# Patient Record
Sex: Female | Born: 1955
Health system: Southern US, Community
[De-identification: ages and names within clinical notes are randomized; demographics above are authoritative.]

## PROBLEM LIST (undated history)

## (undated) DIAGNOSIS — K921 Melena: Secondary | ICD-10-CM

## (undated) DIAGNOSIS — R51 Headache: Secondary | ICD-10-CM

## (undated) DIAGNOSIS — D62 Acute posthemorrhagic anemia: Secondary | ICD-10-CM

## (undated) DIAGNOSIS — E559 Vitamin D deficiency, unspecified: Secondary | ICD-10-CM

## (undated) DIAGNOSIS — I4819 Other persistent atrial fibrillation: Secondary | ICD-10-CM

## (undated) DIAGNOSIS — E7800 Pure hypercholesterolemia, unspecified: Secondary | ICD-10-CM

## (undated) DIAGNOSIS — C801 Malignant (primary) neoplasm, unspecified: Secondary | ICD-10-CM

## (undated) DIAGNOSIS — R519 Headache, unspecified: Secondary | ICD-10-CM

## (undated) DIAGNOSIS — R569 Unspecified convulsions: Secondary | ICD-10-CM

## (undated) DIAGNOSIS — I10 Essential (primary) hypertension: Secondary | ICD-10-CM

## (undated) DIAGNOSIS — F419 Anxiety disorder, unspecified: Secondary | ICD-10-CM

---
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
## 2007-04-05 ENCOUNTER — Other Ambulatory Visit: Admission: RE | Admit: 2007-04-05 | Discharge: 2007-04-05 | Payer: Self-pay | Admitting: Family Medicine

## 2009-08-24 ENCOUNTER — Other Ambulatory Visit: Admission: RE | Admit: 2009-08-24 | Discharge: 2009-08-24 | Payer: Self-pay | Admitting: Family Medicine

## 2012-09-20 ENCOUNTER — Other Ambulatory Visit: Payer: Self-pay | Admitting: Physician Assistant

## 2012-09-20 ENCOUNTER — Other Ambulatory Visit (HOSPITAL_COMMUNITY)
Admission: RE | Admit: 2012-09-20 | Discharge: 2012-09-20 | Disposition: A | Discharge status: Home / Self Care | Payer: BC Managed Care – PPO | Source: Ambulatory Visit | Attending: Family Medicine | Admitting: Family Medicine

## 2012-09-20 DIAGNOSIS — Z0000 Encounter for general adult medical examination without abnormal findings: Secondary | ICD-10-CM | POA: Insufficient documentation

## 2017-04-02 ENCOUNTER — Other Ambulatory Visit: Payer: Self-pay

## 2017-04-02 ENCOUNTER — Emergency Department (HOSPITAL_COMMUNITY): Payer: BLUE CROSS/BLUE SHIELD

## 2017-04-02 ENCOUNTER — Encounter (HOSPITAL_COMMUNITY): Payer: Self-pay

## 2017-04-02 ENCOUNTER — Inpatient Hospital Stay (HOSPITAL_COMMUNITY)
Admission: EM | Admit: 2017-04-02 | Discharge: 2017-04-22 | DRG: 356 | Disposition: A | Discharge status: Rehabilitation Facility | Payer: BLUE CROSS/BLUE SHIELD | Attending: Internal Medicine | Admitting: Internal Medicine

## 2017-04-02 DIAGNOSIS — R1030 Lower abdominal pain, unspecified: Secondary | ICD-10-CM | POA: Diagnosis present

## 2017-04-02 DIAGNOSIS — K5730 Diverticulosis of large intestine without perforation or abscess without bleeding: Secondary | ICD-10-CM | POA: Diagnosis present

## 2017-04-02 DIAGNOSIS — R042 Hemoptysis: Secondary | ICD-10-CM | POA: Diagnosis not present

## 2017-04-02 DIAGNOSIS — Z4659 Encounter for fitting and adjustment of other gastrointestinal appliance and device: Secondary | ICD-10-CM

## 2017-04-02 DIAGNOSIS — I5030 Unspecified diastolic (congestive) heart failure: Secondary | ICD-10-CM | POA: Diagnosis present

## 2017-04-02 DIAGNOSIS — E876 Hypokalemia: Secondary | ICD-10-CM | POA: Diagnosis present

## 2017-04-02 DIAGNOSIS — K922 Gastrointestinal hemorrhage, unspecified: Secondary | ICD-10-CM

## 2017-04-02 DIAGNOSIS — Z978 Presence of other specified devices: Secondary | ICD-10-CM

## 2017-04-02 DIAGNOSIS — R571 Hypovolemic shock: Secondary | ICD-10-CM | POA: Diagnosis present

## 2017-04-02 DIAGNOSIS — R531 Weakness: Secondary | ICD-10-CM | POA: Diagnosis not present

## 2017-04-02 DIAGNOSIS — J9601 Acute respiratory failure with hypoxia: Secondary | ICD-10-CM | POA: Diagnosis not present

## 2017-04-02 DIAGNOSIS — R569 Unspecified convulsions: Secondary | ICD-10-CM | POA: Diagnosis not present

## 2017-04-02 DIAGNOSIS — D649 Anemia, unspecified: Secondary | ICD-10-CM | POA: Diagnosis not present

## 2017-04-02 DIAGNOSIS — I481 Persistent atrial fibrillation: Secondary | ICD-10-CM | POA: Diagnosis not present

## 2017-04-02 DIAGNOSIS — K250 Acute gastric ulcer with hemorrhage: Principal | ICD-10-CM | POA: Diagnosis present

## 2017-04-02 DIAGNOSIS — Z01818 Encounter for other preprocedural examination: Secondary | ICD-10-CM

## 2017-04-02 DIAGNOSIS — D4989 Neoplasm of unspecified behavior of other specified sites: Secondary | ICD-10-CM | POA: Diagnosis present

## 2017-04-02 DIAGNOSIS — Z7901 Long term (current) use of anticoagulants: Secondary | ICD-10-CM | POA: Diagnosis not present

## 2017-04-02 DIAGNOSIS — I340 Nonrheumatic mitral (valve) insufficiency: Secondary | ICD-10-CM | POA: Diagnosis not present

## 2017-04-02 DIAGNOSIS — K920 Hematemesis: Secondary | ICD-10-CM | POA: Diagnosis not present

## 2017-04-02 DIAGNOSIS — D696 Thrombocytopenia, unspecified: Secondary | ICD-10-CM | POA: Diagnosis not present

## 2017-04-02 DIAGNOSIS — K921 Melena: Secondary | ICD-10-CM | POA: Diagnosis present

## 2017-04-02 DIAGNOSIS — R197 Diarrhea, unspecified: Secondary | ICD-10-CM | POA: Diagnosis not present

## 2017-04-02 DIAGNOSIS — R5381 Other malaise: Secondary | ICD-10-CM | POA: Diagnosis not present

## 2017-04-02 DIAGNOSIS — I4819 Other persistent atrial fibrillation: Secondary | ICD-10-CM | POA: Diagnosis not present

## 2017-04-02 DIAGNOSIS — E7800 Pure hypercholesterolemia, unspecified: Secondary | ICD-10-CM | POA: Diagnosis present

## 2017-04-02 DIAGNOSIS — R7881 Bacteremia: Secondary | ICD-10-CM | POA: Diagnosis present

## 2017-04-02 DIAGNOSIS — R578 Other shock: Secondary | ICD-10-CM | POA: Diagnosis not present

## 2017-04-02 DIAGNOSIS — E46 Unspecified protein-calorie malnutrition: Secondary | ICD-10-CM | POA: Diagnosis not present

## 2017-04-02 DIAGNOSIS — I6783 Posterior reversible encephalopathy syndrome: Secondary | ICD-10-CM | POA: Diagnosis not present

## 2017-04-02 DIAGNOSIS — E785 Hyperlipidemia, unspecified: Secondary | ICD-10-CM | POA: Diagnosis present

## 2017-04-02 DIAGNOSIS — J9859 Other diseases of mediastinum, not elsewhere classified: Secondary | ICD-10-CM

## 2017-04-02 DIAGNOSIS — R109 Unspecified abdominal pain: Secondary | ICD-10-CM

## 2017-04-02 DIAGNOSIS — D62 Acute posthemorrhagic anemia: Secondary | ICD-10-CM | POA: Diagnosis present

## 2017-04-02 DIAGNOSIS — Z7982 Long term (current) use of aspirin: Secondary | ICD-10-CM

## 2017-04-02 DIAGNOSIS — J9600 Acute respiratory failure, unspecified whether with hypoxia or hypercapnia: Secondary | ICD-10-CM | POA: Diagnosis not present

## 2017-04-02 DIAGNOSIS — I700 Atherosclerosis of aorta: Secondary | ICD-10-CM | POA: Diagnosis present

## 2017-04-02 DIAGNOSIS — N179 Acute kidney failure, unspecified: Secondary | ICD-10-CM | POA: Diagnosis present

## 2017-04-02 DIAGNOSIS — R222 Localized swelling, mass and lump, trunk: Secondary | ICD-10-CM | POA: Diagnosis not present

## 2017-04-02 DIAGNOSIS — N814 Uterovaginal prolapse, unspecified: Secondary | ICD-10-CM | POA: Diagnosis present

## 2017-04-02 DIAGNOSIS — K253 Acute gastric ulcer without hemorrhage or perforation: Secondary | ICD-10-CM | POA: Diagnosis not present

## 2017-04-02 DIAGNOSIS — B9620 Unspecified Escherichia coli [E. coli] as the cause of diseases classified elsewhere: Secondary | ICD-10-CM | POA: Diagnosis present

## 2017-04-02 DIAGNOSIS — K264 Chronic or unspecified duodenal ulcer with hemorrhage: Secondary | ICD-10-CM

## 2017-04-02 DIAGNOSIS — G7281 Critical illness myopathy: Secondary | ICD-10-CM | POA: Diagnosis not present

## 2017-04-02 DIAGNOSIS — E874 Mixed disorder of acid-base balance: Secondary | ICD-10-CM | POA: Diagnosis not present

## 2017-04-02 DIAGNOSIS — Z452 Encounter for adjustment and management of vascular access device: Secondary | ICD-10-CM

## 2017-04-02 DIAGNOSIS — K56609 Unspecified intestinal obstruction, unspecified as to partial versus complete obstruction: Secondary | ICD-10-CM

## 2017-04-02 DIAGNOSIS — K909 Intestinal malabsorption, unspecified: Secondary | ICD-10-CM | POA: Diagnosis not present

## 2017-04-02 DIAGNOSIS — R935 Abnormal findings on diagnostic imaging of other abdominal regions, including retroperitoneum: Secondary | ICD-10-CM | POA: Diagnosis not present

## 2017-04-02 DIAGNOSIS — M7989 Other specified soft tissue disorders: Secondary | ICD-10-CM | POA: Diagnosis not present

## 2017-04-02 DIAGNOSIS — G9341 Metabolic encephalopathy: Secondary | ICD-10-CM | POA: Diagnosis not present

## 2017-04-02 DIAGNOSIS — Z6822 Body mass index (BMI) 22.0-22.9, adult: Secondary | ICD-10-CM

## 2017-04-02 DIAGNOSIS — E440 Moderate protein-calorie malnutrition: Secondary | ICD-10-CM | POA: Diagnosis present

## 2017-04-02 DIAGNOSIS — I82431 Acute embolism and thrombosis of right popliteal vein: Secondary | ICD-10-CM | POA: Diagnosis not present

## 2017-04-02 DIAGNOSIS — N136 Pyonephrosis: Secondary | ICD-10-CM | POA: Diagnosis present

## 2017-04-02 DIAGNOSIS — Z9981 Dependence on supplemental oxygen: Secondary | ICD-10-CM

## 2017-04-02 DIAGNOSIS — I82401 Acute embolism and thrombosis of unspecified deep veins of right lower extremity: Secondary | ICD-10-CM

## 2017-04-02 DIAGNOSIS — I480 Paroxysmal atrial fibrillation: Secondary | ICD-10-CM | POA: Diagnosis not present

## 2017-04-02 DIAGNOSIS — F17200 Nicotine dependence, unspecified, uncomplicated: Secondary | ICD-10-CM | POA: Diagnosis present

## 2017-04-02 DIAGNOSIS — R9431 Abnormal electrocardiogram [ECG] [EKG]: Secondary | ICD-10-CM

## 2017-04-02 DIAGNOSIS — L89150 Pressure ulcer of sacral region, unstageable: Secondary | ICD-10-CM

## 2017-04-02 DIAGNOSIS — N390 Urinary tract infection, site not specified: Secondary | ICD-10-CM

## 2017-04-02 DIAGNOSIS — E870 Hyperosmolality and hypernatremia: Secondary | ICD-10-CM | POA: Diagnosis not present

## 2017-04-02 DIAGNOSIS — R945 Abnormal results of liver function studies: Secondary | ICD-10-CM | POA: Diagnosis not present

## 2017-04-02 DIAGNOSIS — I10 Essential (primary) hypertension: Secondary | ICD-10-CM | POA: Diagnosis not present

## 2017-04-02 DIAGNOSIS — J9690 Respiratory failure, unspecified, unspecified whether with hypoxia or hypercapnia: Secondary | ICD-10-CM

## 2017-04-02 DIAGNOSIS — I484 Atypical atrial flutter: Secondary | ICD-10-CM | POA: Diagnosis not present

## 2017-04-02 DIAGNOSIS — Z8249 Family history of ischemic heart disease and other diseases of the circulatory system: Secondary | ICD-10-CM

## 2017-04-02 DIAGNOSIS — I4891 Unspecified atrial fibrillation: Secondary | ICD-10-CM | POA: Diagnosis not present

## 2017-04-02 DIAGNOSIS — I110 Hypertensive heart disease with heart failure: Secondary | ICD-10-CM | POA: Diagnosis present

## 2017-04-02 DIAGNOSIS — R0902 Hypoxemia: Secondary | ICD-10-CM | POA: Diagnosis not present

## 2017-04-02 DIAGNOSIS — R739 Hyperglycemia, unspecified: Secondary | ICD-10-CM | POA: Diagnosis not present

## 2017-04-02 DIAGNOSIS — R0600 Dyspnea, unspecified: Secondary | ICD-10-CM | POA: Diagnosis not present

## 2017-04-02 HISTORY — DX: Melena: K92.1

## 2017-04-02 HISTORY — DX: Pure hypercholesterolemia, unspecified: E78.00

## 2017-04-02 HISTORY — DX: Other persistent atrial fibrillation: I48.19

## 2017-04-02 HISTORY — DX: Essential (primary) hypertension: I10

## 2017-04-02 HISTORY — DX: Acute posthemorrhagic anemia: D62

## 2017-04-02 HISTORY — DX: Vitamin D deficiency, unspecified: E55.9

## 2017-04-02 LAB — PREPARE FRESH FROZEN PLASMA
Unit division: 0
Unit division: 0

## 2017-04-02 LAB — COMPREHENSIVE METABOLIC PANEL
ALT: 162 U/L — ABNORMAL HIGH (ref 14–54)
AST: 106 U/L — ABNORMAL HIGH (ref 15–41)
Albumin: 2.1 g/dL — ABNORMAL LOW (ref 3.5–5.0)
Alkaline Phosphatase: 118 U/L (ref 38–126)
Anion gap: 22 — ABNORMAL HIGH (ref 5–15)
BUN: 116 mg/dL — ABNORMAL HIGH (ref 6–20)
CO2: 17 mmol/L — ABNORMAL LOW (ref 22–32)
Calcium: 8.4 mg/dL — ABNORMAL LOW (ref 8.9–10.3)
Chloride: 99 mmol/L — ABNORMAL LOW (ref 101–111)
Creatinine, Ser: 1.49 mg/dL — ABNORMAL HIGH (ref 0.44–1.00)
GFR calc Af Amer: 42 mL/min — ABNORMAL LOW (ref >60)
GFR calc non Af Amer: 36 mL/min — ABNORMAL LOW (ref >60)
Glucose, Bld: 225 mg/dL — ABNORMAL HIGH (ref 65–99)
Potassium: 2.6 mmol/L — CL (ref 3.5–5.1)
Sodium: 138 mmol/L (ref 135–145)
Total Bilirubin: 0.7 mg/dL (ref 0.3–1.2)
Total Protein: 5.2 g/dL — ABNORMAL LOW (ref 6.5–8.1)

## 2017-04-02 LAB — I-STAT CHEM 8, ED
BUN: 100 mg/dL — ABNORMAL HIGH (ref 6–20)
BUN: 85 mg/dL — ABNORMAL HIGH (ref 6–20)
Calcium, Ion: 1.09 mmol/L — ABNORMAL LOW (ref 1.15–1.40)
Calcium, Ion: 1.05 mmol/L — ABNORMAL LOW (ref 1.15–1.40)
Chloride: 100 mmol/L — ABNORMAL LOW (ref 101–111)
Chloride: 106 mmol/L (ref 101–111)
Creatinine, Ser: 1.3 mg/dL — ABNORMAL HIGH (ref 0.44–1.00)
Creatinine, Ser: 0.9 mg/dL (ref 0.44–1.00)
Glucose, Bld: 220 mg/dL — ABNORMAL HIGH (ref 65–99)
Glucose, Bld: 93 mg/dL (ref 65–99)
HCT: 22 % — ABNORMAL LOW (ref 36.0–46.0)
HCT: 20 % — ABNORMAL LOW (ref 36.0–46.0)
Hemoglobin: 7.5 g/dL — ABNORMAL LOW (ref 12.0–15.0)
Hemoglobin: 6.8 g/dL — CL (ref 12.0–15.0)
Potassium: 2.6 mmol/L — CL (ref 3.5–5.1)
Potassium: 2.8 mmol/L — ABNORMAL LOW (ref 3.5–5.1)
Sodium: 138 mmol/L (ref 135–145)
Sodium: 141 mmol/L (ref 135–145)
TCO2: 19 mmol/L — ABNORMAL LOW (ref 22–32)
TCO2: 22 mmol/L (ref 22–32)

## 2017-04-02 LAB — CBC
HCT: 22.8 % — ABNORMAL LOW (ref 36.0–46.0)
Hemoglobin: 8 g/dL — ABNORMAL LOW (ref 12.0–15.0)
MCH: 31 pg (ref 26.0–34.0)
MCHC: 35.1 g/dL (ref 30.0–36.0)
MCV: 88.4 fL (ref 78.0–100.0)
Platelets: 249 10*3/uL (ref 150–400)
RBC: 2.58 MIL/uL — ABNORMAL LOW (ref 3.87–5.11)
RDW: 13.9 % (ref 11.5–15.5)
WBC: 16.2 10*3/uL — ABNORMAL HIGH (ref 4.0–10.5)

## 2017-04-02 LAB — URINALYSIS, ROUTINE W REFLEX MICROSCOPIC
Bilirubin Urine: NEGATIVE
Glucose, UA: NEGATIVE mg/dL
Ketones, ur: NEGATIVE mg/dL
Nitrite: NEGATIVE
Protein, ur: NEGATIVE mg/dL
Specific Gravity, Urine: 1.008 (ref 1.005–1.030)
pH: 5 (ref 5.0–8.0)

## 2017-04-02 LAB — PREPARE RBC (CROSSMATCH)

## 2017-04-02 LAB — I-STAT CG4 LACTIC ACID, ED
Lactic Acid, Venous: 7.98 mmol/L (ref 0.5–1.9)
Lactic Acid, Venous: 1.05 mmol/L (ref 0.5–1.9)

## 2017-04-02 LAB — POC OCCULT BLOOD, ED: Fecal Occult Bld: POSITIVE — AB

## 2017-04-02 LAB — LIPASE, BLOOD: Lipase: 17 U/L (ref 11–51)

## 2017-04-02 LAB — BPAM FFP
Blood Product Expiration Date: 201901252359
Blood Product Expiration Date: 201901252359
ISSUE DATE / TIME: 201901241558
ISSUE DATE / TIME: 201901241558
Unit Type and Rh: 6200
Unit Type and Rh: 6200

## 2017-04-02 LAB — PROTIME-INR
INR: 1.14
Prothrombin Time: 14.5 seconds (ref 11.4–15.2)

## 2017-04-02 LAB — I-STAT TROPONIN, ED: Troponin i, poc: 0.01 ng/mL (ref 0.00–0.08)

## 2017-04-02 LAB — APTT: aPTT: 27 seconds (ref 24–36)

## 2017-04-02 LAB — LACTIC ACID, PLASMA: Lactic Acid, Venous: 1.2 mmol/L (ref 0.5–1.9)

## 2017-04-02 LAB — ABO/RH: ABO/RH(D): AB POS

## 2017-04-02 MED ORDER — VANCOMYCIN HCL IN DEXTROSE 1-5 GM/200ML-% IV SOLN
1000.0000 mg | Freq: Once | INTRAVENOUS | Status: AC
Start: 2017-04-02 — End: 2017-04-02
  Administered 2017-04-02: 1000 mg via INTRAVENOUS
  Filled 2017-04-02: qty 200

## 2017-04-02 MED ORDER — FAMOTIDINE IN NACL 20-0.9 MG/50ML-% IV SOLN
20.0000 mg | Freq: Once | INTRAVENOUS | Status: AC
  Administered 2017-04-02: 20 mg via INTRAVENOUS
  Filled 2017-04-02: qty 50

## 2017-04-02 MED ORDER — ACETAMINOPHEN 325 MG PO TABS
650.0000 mg | ORAL_TABLET | Freq: Four times a day (QID) | ORAL | Status: DC | PRN
  Administered 2017-04-08 – 2017-04-21 (×6): 650 mg via ORAL
  Filled 2017-04-02 (×6): qty 2

## 2017-04-02 MED ORDER — SODIUM CHLORIDE 0.9 % IV SOLN
8.0000 mg/h | INTRAVENOUS | Status: AC
  Administered 2017-04-02 – 2017-04-04 (×3): 8 mg/h via INTRAVENOUS
  Filled 2017-04-02 (×10): qty 80

## 2017-04-02 MED ORDER — SODIUM CHLORIDE 0.9 % IV BOLUS (SEPSIS)
1000.0000 mL | Freq: Once | INTRAVENOUS | Status: AC
Start: 2017-04-02 — End: 2017-04-02
  Administered 2017-04-02: 1000 mL via INTRAVENOUS

## 2017-04-02 MED ORDER — SODIUM CHLORIDE 0.9% FLUSH
3.0000 mL | Freq: Two times a day (BID) | INTRAVENOUS | Status: DC
  Administered 2017-04-02 – 2017-04-22 (×28): 3 mL via INTRAVENOUS

## 2017-04-02 MED ORDER — SODIUM CHLORIDE 0.9 % IV SOLN
1000.0000 mL | INTRAVENOUS | Status: DC
  Administered 2017-04-02: 1000 mL via INTRAVENOUS

## 2017-04-02 MED ORDER — ONDANSETRON HCL 4 MG/2ML IJ SOLN: 4.0000 mg | Freq: Four times a day (QID) | INTRAMUSCULAR | Status: DC | PRN

## 2017-04-02 MED ORDER — POTASSIUM CHLORIDE 10 MEQ/100ML IV SOLN
10.0000 meq | Freq: Once | INTRAVENOUS | Status: AC
  Administered 2017-04-02: 10 meq via INTRAVENOUS
  Filled 2017-04-02: qty 100

## 2017-04-02 MED ORDER — PANTOPRAZOLE SODIUM 40 MG IV SOLR
40.0000 mg | Freq: Once | INTRAVENOUS | Status: AC
  Administered 2017-04-02: 40 mg via INTRAVENOUS
  Filled 2017-04-02: qty 40

## 2017-04-02 MED ORDER — PEG 3350-KCL-NA BICARB-NACL 420 G PO SOLR
4000.0000 mL | Freq: Once | ORAL | Status: DC
  Filled 2017-04-02: qty 4000

## 2017-04-02 MED ORDER — PANTOPRAZOLE SODIUM 40 MG IV SOLR
40.0000 mg | Freq: Once | INTRAVENOUS | Status: DC
  Filled 2017-04-02: qty 40

## 2017-04-02 MED ORDER — PIPERACILLIN-TAZOBACTAM 3.375 G IVPB 30 MIN
3.3750 g | Freq: Once | INTRAVENOUS | Status: AC
  Administered 2017-04-02: 3.375 g via INTRAVENOUS
  Filled 2017-04-02: qty 50

## 2017-04-02 MED ORDER — VANCOMYCIN HCL 500 MG IV SOLR
500.0000 mg | Freq: Two times a day (BID) | INTRAVENOUS | Status: DC
  Administered 2017-04-03 – 2017-04-04 (×3): 500 mg via INTRAVENOUS
  Filled 2017-04-02 (×5): qty 500

## 2017-04-02 MED ORDER — NICOTINE 14 MG/24HR TD PT24
14.0000 mg | MEDICATED_PATCH | Freq: Every day | TRANSDERMAL | Status: DC | PRN
  Filled 2017-04-02: qty 1

## 2017-04-02 MED ORDER — SODIUM CHLORIDE 0.9 % IV BOLUS (SEPSIS)
250.0000 mL | Freq: Once | INTRAVENOUS | Status: AC
  Administered 2017-04-02: 250 mL via INTRAVENOUS

## 2017-04-02 MED ORDER — SODIUM CHLORIDE 0.9 % IV BOLUS (SEPSIS)
1000.0000 mL | Freq: Once | INTRAVENOUS | Status: AC
  Administered 2017-04-02: 1000 mL via INTRAVENOUS

## 2017-04-02 MED ORDER — ONDANSETRON HCL 4 MG PO TABS: 4.0000 mg | ORAL_TABLET | Freq: Four times a day (QID) | ORAL | Status: DC | PRN

## 2017-04-02 MED ORDER — ACETAMINOPHEN 650 MG RE SUPP: 650.0000 mg | Freq: Four times a day (QID) | RECTAL | Status: DC | PRN

## 2017-04-02 MED ORDER — HYDROMORPHONE HCL 1 MG/ML IJ SOLN
0.5000 mg | Freq: Once | INTRAMUSCULAR | Status: AC
  Administered 2017-04-02: 0.5 mg via INTRAVENOUS
  Filled 2017-04-02: qty 1

## 2017-04-02 MED ORDER — PIPERACILLIN-TAZOBACTAM 3.375 G IVPB
3.3750 g | Freq: Three times a day (TID) | INTRAVENOUS | Status: DC
  Administered 2017-04-02 – 2017-04-04 (×5): 3.375 g via INTRAVENOUS
  Filled 2017-04-02 (×7): qty 50

## 2017-04-02 NOTE — ED Notes (Addendum)
Emergency release blood unit [id number] initiated per MD order.

## 2017-04-02 NOTE — ED Notes (Signed)
Pt returned from CT °

## 2017-04-02 NOTE — ED Notes (Signed)
I stat chem 8 results given to Dr. Johnney Killian by B. Yolanda Bonine, EMT

## 2017-04-02 NOTE — ED Triage Notes (Signed)
Per Pt, Pt is coming from home with complaints of abdominal pain and diarrhea that started two days ago. Was seen at MD office and sent over here for low BP. Pt is alert and oriented at this time, but reports increased lower abdominal pain.

## 2017-04-02 NOTE — Progress Notes (Signed)
Pharmacy Antibiotic Note  Kayla Wyatt is a 62 y.o. female admitted on 04/02/2017 with sepsis.  Pharmacy has been consulted for vancomycin and Zosyn dosing. Elevated lactate at 7.98. Elevated WBC at 16.2. Scr 1.3 (Calculated CrCl 47 ml/min).   Plan: Vancomycin 1000mg  IV x1 Vancomycin 500mg  IV q12h Zosyn 3.375g IV x1 Zosyn 3.375g IV q8h (4 hour infusion) F/u renal fxn, trough @ SS, clinical resolution  Height: 5\' 3"  (160 cm) Weight: 150 lb (68 kg) IBW/kg (Calculated) : 52.4  Temp (24hrs), Avg:97.7 F (36.5 C), Min:97.7 F (36.5 C), Max:97.7 F (36.5 C)  Recent Labs  Lab 04/02/17 1459 04/02/17 1518 04/02/17 1559  WBC 16.2*  --   --   CREATININE  --   --  1.30*  LATICACIDVEN  --  7.98*  --     Estimated Creatinine Clearance: 40.4 mL/min (A) (by C-G formula based on SCr of 1.3 mg/dL (H)).    No Known Allergies  Antimicrobials this admission: Vanc 1/24 >>  Zosyn 1/24 >>   Dose adjustments this admission: None  Microbiology results: Pending  Thank you for allowing pharmacy to be a part of this patient's care.  Maddox Hlavaty 04/02/2017 4:28 PM

## 2017-04-02 NOTE — ED Notes (Signed)
EMERGENCY RELEASE BLOOD REQUESTED BY RN CRYSTAL.

## 2017-04-02 NOTE — ED Notes (Signed)
Charge nurse Rich Reining RN notified of I stat lactic acid results.

## 2017-04-02 NOTE — H&P (View-Only) (Signed)
Reason for Consult: Hematemesis, melena, and abdominal pain Referring Physician: [facility]  Windy Dudek HPI: This is a 62 year old female admitted for complaints of lower abdominal pain, melena, and hematemesis.  The patient reports having chronic lower abdominal pain, which worsened a couple of days ago.  She states that she has multiple dark and bowel movements that increasingly worsened.  Today in the ER she was witnessed to have hematemesis.  Her admission BP was hypotensive, but it did increase with fluid resuscitation.  WBC was identified to be at 16.2 and an HGB of 6.8.  A noncontrast CT scan shows bulky infrarenal abdominal aortic atherosclerosis, but there was no overt evidence of ischemia.  Her lactic acid was found to be at 7.89.  Zosyn was initiated.  There is no prior history of GI bleeding and a few years ago she had a normal screening colonoscopy in [place].  Past Medical History:  Diagnosis Date  . Hypercholesteremia   . Hypertension     Past Surgical History:  Procedure Laterality Date  . LEG SURGERY  1974   Blood Clot Removal     No family history on file.  Social History:  reports that she has been smoking.  she has never used smokeless tobacco. She reports that she does not drink alcohol or use drugs.  Allergies: No Known Allergies  Medications:  Scheduled: . pantoprazole (PROTONIX) IV  40 mg Intravenous Once  . polyethylene glycol-electrolytes  4,000 mL Oral Once   Continuous: . sodium chloride 1,000 mL (04/02/17 1550)  . pantoprozole (PROTONIX) infusion    . piperacillin-tazobactam (ZOSYN)  IV    . potassium chloride    . [START ON 04/03/2017] vancomycin      Results for orders placed or performed during the hospital encounter of 04/02/17 (from the past 24 hour(s))  Lipase, blood     Status: None   Collection Time: 04/02/17  2:59 PM  Result Value Ref Range   Lipase 17 11 - 51 U/L  Comprehensive metabolic panel     Status: Abnormal   Collection Time: 04/02/17  2:59 PM  Result Value Ref Range   Sodium 138 135 - 145 mmol/L   Potassium 2.6 (LL) 3.5 - 5.1 mmol/L   Chloride 99 (L) 101 - 111 mmol/L   CO2 17 (L) 22 - 32 mmol/L   Glucose, Bld 225 (H) 65 - 99 mg/dL   BUN 116 (H) 6 - 20 mg/dL   Creatinine, Ser 1.49 (H) 0.44 - 1.00 mg/dL   Calcium 8.4 (L) 8.9 - 10.3 mg/dL   Total Protein 5.2 (L) 6.5 - 8.1 g/dL   Albumin 2.1 (L) 3.5 - 5.0 g/dL   AST 106 (H) 15 - 41 U/L   ALT 162 (H) 14 - 54 U/L   Alkaline Phosphatase 118 38 - 126 U/L   Total Bilirubin 0.7 0.3 - 1.2 mg/dL   GFR calc non Af Amer 36 (L) >60 mL/min   GFR calc Af Amer 42 (L) >60 mL/min   Anion gap 22 (H) 5 - 15  CBC     Status: Abnormal   Collection Time: 04/02/17  2:59 PM  Result Value Ref Range   WBC 16.2 (H) 4.0 - 10.5 K/uL   RBC 2.58 (L) 3.87 - 5.11 MIL/uL   Hemoglobin 8.0 (L) 12.0 - 15.0 g/dL   HCT 22.8 (L) 36.0 - 46.0 %   MCV 88.4 78.0 - 100.0 fL   MCH 31.0 26.0 - 34.0 pg  MCHC 35.1 30.0 - 36.0 g/dL   RDW 13.9 11.5 - 15.5 %   Platelets 249 150 - 400 K/uL  Protime-INR     Status: None   Collection Time: 04/02/17  2:59 PM  Result Value Ref Range   Prothrombin Time 14.5 11.4 - 15.2 seconds   INR 1.14   I-Stat CG4 Lactic Acid, ED     Status: Abnormal   Collection Time: 04/02/17  3:18 PM  Result Value Ref Range   Lactic Acid, Venous 7.98 (HH) 0.5 - 1.9 mmol/L   Comment NOTIFIED PHYSICIAN   Type and screen [place]     Status: None (Preliminary result)   Collection Time: 04/02/17  3:44 PM  Result Value Ref Range   ABO/RH(D) AB POS    Antibody Screen NEG    Sample Expiration 04/05/2017    Unit Number [id number]    Blood Component Type RED CELLS,LR    Unit division 00    Status of Unit ISSUED    Unit tag comment VERBAL ORDERS PER DR PFEIFER    Transfusion Status OK TO TRANSFUSE    Crossmatch Result COMPATIBLE    Unit Number [id number]    Blood Component Type RED CELLS,LR    Unit division 00    Status of Unit REL  FROM [facility]    Unit tag comment VERBAL ORDERS PER DR PFEIFER    Transfusion Status OK TO TRANSFUSE    Crossmatch Result NOT NEEDED    Unit Number [id number]    Blood Component Type RED CELLS,LR    Unit division 00    Status of Unit ALLOCATED    Transfusion Status OK TO TRANSFUSE    Crossmatch Result Compatible    Unit Number [id number]    Blood Component Type RED CELLS,LR    Unit division 00    Status of Unit ALLOCATED    Transfusion Status OK TO TRANSFUSE    Crossmatch Result Compatible   ABO/Rh     Status: None   Collection Time: 04/02/17  3:44 PM  Result Value Ref Range   ABO/RH(D) AB POS   POC occult blood, ED     Status: Abnormal   Collection Time: 04/02/17  3:52 PM  Result Value Ref Range   Fecal Occult Bld POSITIVE (A) NEGATIVE  I-stat troponin, ED (not at [facility])     Status: None   Collection Time: 04/02/17  3:57 PM  Result Value Ref Range   Troponin i, poc 0.01 0.00 - 0.08 ng/mL   Comment 3          Prepare RBC     Status: None   Collection Time: 04/02/17  3:57 PM  Result Value Ref Range   Order Confirmation ORDER PROCESSED BY BLOOD BANK   Prepare fresh frozen plasma     Status: None   Collection Time: 04/02/17  3:57 PM  Result Value Ref Range   Unit Number [id number]    Blood Component Type LIQ PLASMA    Unit division 00    Status of Unit REL FROM [facility]    Unit tag comment VERBAL ORDERS PER DR PFEIFER    Transfusion Status OK TO TRANSFUSE    Unit Number [id number]    Blood Component Type LIQ PLASMA    Unit division 00    Status of Unit REL FROM [facility]    Unit tag comment VERBAL ORDERS PER DR PFEIFER    Transfusion Status OK TO TRANSFUSE   I-stat Chem 8,  ED     Status: Abnormal   Collection Time: 04/02/17  3:59 PM  Result Value Ref Range   Sodium 138 135 - 145 mmol/L   Potassium 2.6 (LL) 3.5 - 5.1 mmol/L   Chloride 100 (L) 101 - 111 mmol/L   BUN 100 (H) 6 - 20 mg/dL   Creatinine, Ser 1.30 (H) 0.44 - 1.00 mg/dL   Glucose, Bld 220 (H) 65 -  99 mg/dL   Calcium, Ion 1.09 (L) 1.15 - 1.40 mmol/L   TCO2 19 (L) 22 - 32 mmol/L   Hemoglobin 7.5 (L) 12.0 - 15.0 g/dL   HCT 22.0 (L) 36.0 - 46.0 %   Comment NOTIFIED PHYSICIAN   APTT     Status: None   Collection Time: 04/02/17  4:38 PM  Result Value Ref Range   aPTT 27 24 - 36 seconds  Urinalysis, Routine w reflex microscopic     Status: Abnormal   Collection Time: 04/02/17  5:05 PM  Result Value Ref Range   Color, Urine YELLOW YELLOW   APPearance CLOUDY (A) CLEAR   Specific Gravity, Urine 1.008 1.005 - 1.030   pH 5.0 5.0 - 8.0   Glucose, UA NEGATIVE NEGATIVE mg/dL   Hgb urine dipstick LARGE (A) NEGATIVE   Bilirubin Urine NEGATIVE NEGATIVE   Ketones, ur NEGATIVE NEGATIVE mg/dL   Protein, ur NEGATIVE NEGATIVE mg/dL   Nitrite NEGATIVE NEGATIVE   Leukocytes, UA LARGE (A) NEGATIVE   RBC / HPF 0-5 0 - 5 RBC/hpf   WBC, UA TOO NUMEROUS TO COUNT 0 - 5 WBC/hpf   Bacteria, UA MANY (A) NONE SEEN   Squamous Epithelial / LPF 0-5 (A) NONE SEEN   WBC Clumps PRESENT    Mucus PRESENT    Hyaline Casts, UA PRESENT   I-stat chem 8, ed     Status: Abnormal   Collection Time: 04/02/17  7:32 PM  Result Value Ref Range   Sodium 141 135 - 145 mmol/L   Potassium 2.8 (L) 3.5 - 5.1 mmol/L   Chloride 106 101 - 111 mmol/L   BUN 85 (H) 6 - 20 mg/dL   Creatinine, Ser 0.90 0.44 - 1.00 mg/dL   Glucose, Bld 93 65 - 99 mg/dL   Calcium, Ion 1.05 (L) 1.15 - 1.40 mmol/L   TCO2 22 22 - 32 mmol/L   Hemoglobin 6.8 (LL) 12.0 - 15.0 g/dL   HCT 20.0 (L) 36.0 - 46.0 %   Comment NOTIFIED PHYSICIAN   I-Stat CG4 Lactic Acid, ED     Status: None   Collection Time: 04/02/17  7:39 PM  Result Value Ref Range   Lactic Acid, Venous 1.05 0.5 - 1.9 mmol/L     Ct Abdomen Pelvis Wo Contrast  Result Date: 04/02/2017 CLINICAL DATA:  62 year old female with abdominal pain and diarrhea beginning 2 days ago. Hypotensive at physician's office today. Melena. EXAM: CT ABDOMEN AND PELVIS WITHOUT CONTRAST TECHNIQUE: Multidetector  CT imaging of the abdomen and pelvis was performed following the standard protocol without IV contrast. COMPARISON:  Portable chest radiograph 1708 hr today. FINDINGS: Lower chest: Normal lung bases. No cardiomegaly, pericardial effusion or pleural effusion. Hepatobiliary: Negative noncontrast liver and gallbladder. Pancreas: Diminutive, a degree of pancreatic atrophy is suspected. Spleen: Negative. Adrenals/Urinary Tract: Mild bilateral adrenal gland thickening such as due to hyperplasia. No nephrolithiasis. The bilateral ureters and bilateral renal collecting systems are somewhat prominent, but appears symmetric. There is no calculus identified along the course of either ureter. Urinary bladder is diminutive and  unremarkable. There is no definite acute perinephric stranding. There is possibly a urethral diverticulum measuring about 12 millimeters on series 3, image 86. Stomach/Bowel: Decompressed rectum and sigmoid colon. Decompressed left colon and splenic flexure. Decompressed transverse colon and right colon which demonstrate some nonspecific fatty proliferation of the mucosa. Normal appendix. Decompressed terminal ileum. Upper limits of normal to mildly dilated distal small bowel loops upstream of the duodenum located in the right lower abdomen and pelvis (e.g. Series 3, images 60-67. There is a gradual transition to normal sized bowel loops both proximal and distal to these segments. No mesenteric stranding. Mildly flocculated material within some of the prominent loops. Other small bowel loops are nondilated and appear normal. Negative stomach and duodenum. No abdominal free air.  No free fluid. Vascular/Lymphatic: Aortoiliac calcified atherosclerosis. There is evidence of severe infrarenal aortic stenosis on series 3, image 33 related to bulky calcified plaque. Vascular patency is not evaluated in the absence of IV contrast. New line no lymphadenopathy identified. Reproductive: Surgically absent. Other: No  pelvic free fluid. Musculoskeletal: No acute osseous abnormality identified. IMPRESSION: 1. Bulky calcified atherosclerosis of the infrarenal abdominal aorta suspicious for a high-grade aortic stenosis (infrarenal) on series 3, image 33. 2. Several mildly dilated distal small bowel loops, but no transition point. No associated mesenteric inflammation or free fluid. No inflamed large bowel identified. The appearance is nonspecific and might reflect ileus. In light of #1, bowel ischemia was considered, but there is no strong evidence of ischemic bowel at this time. 3. Mild symmetric appearing bilateral hydronephrosis and hydroureter with no obstructing etiology identified. Electronically Signed   By: Genevie Ann M.D.   On: 04/02/2017 18:48   Dg Chest Port 1 View  Result Date: 04/02/2017 CLINICAL DATA:  Sepsis. Abdominal pain, vomiting, and diarrhea. Smoking history. EXAM: PORTABLE CHEST 1 VIEW COMPARISON:  None. FINDINGS: The cardiac silhouette is upper limits of normal in size. No airspace consolidation, edema, pleural effusion, pneumothorax is identified. No acute osseous abnormality is seen. IMPRESSION: No active disease. Electronically Signed   By: Logan Bores M.D.   On: 04/02/2017 17:34    ROS:  As stated above in the HPI otherwise negative.  Blood pressure (!) 102/49, pulse 79, temperature 97.9 F (36.6 C), temperature source Oral, resp. rate (!) 24, height 5\' 3"  (1.6 m), weight 68 kg (150 lb), SpO2 95 %.    PE: Gen: Uncomfortable appearing, Alert and Oriented HEENT:  [place]/AT, EOMI Neck: Supple, no LAD Lungs: CTA Bilaterally CV: RRR without M/G/R ABM: Soft, NTND, +BS - She just received pain medication. Ext: No C/C/E  Assessment/Plan: 1) Anemia. 2) Melena. 3) Hematemesis. 4) Atherosclerosis. 5) Lower abdominal pain.   Further evaluation with an EGD/Colonoscopy will be pursued.  She thinks that she can take the prep for the colonoscopy.  Her lower abdominal pain improved with pain  medication, but is the concern for aortic stenosis.  There was no overt evidence of ischemic bowel, but this was a noncontrast scan.  Plan: 1) EGD/Colonoscopy tomorrow.  Priscila Bean D 04/02/2017, 8:01 PM

## 2017-04-02 NOTE — ED Provider Notes (Signed)
Patient reports lower abdominal pain and diarrhea every time she eats for about a week.  She reports she has been taking ibuprofen and Aleve to try to relieve abdominal pain.  Reports 1 episodes of vomiting yesterday.  It looked like cranberry juice but she reports she had just drank cranberry juice.  Bowel movement has been dark looking.  No known history of GI bleed.  Patient also typically takes 1 daily aspirin.  Only medical problems are hypertension hypercholesterolemia.  Patient is alert and oriented.  She is very pale in appearance.  She appears uncomfortable.  Heart is regular, tachycardic.  Lungs are grossly clear no wheeze rhonchi.  Abdomen is soft without guarding but patient identifies some lower central abdominal pain.  Rectal exam is done by PA-C while I am in the room.  Stool is melanotic.  No peripheral edema.  Skin is warm and dry but pale.  Neurologically the patient is intact.  Immediate findings are concerning for GI bleed with hypovolemia.  Also consideration for sepsis of abdominal etiology.  Treatment for both etiologies are being initiated emergently as diagnostic evaluation is continued.    Charlesetta Shanks, MD 04/02/17 [phone]

## 2017-04-02 NOTE — ED Provider Notes (Signed)
[facility] Provider Note   CSN: [id number] Arrival date & time: 04/02/17  1447     History   Chief Complaint Chief Complaint  Patient presents with  . Abdominal Pain    HPI Kayla Wyatt is a 62 y.o. female with history of hypertension and hyperlipidemia here for evaluation of lower abdominal pain, bloody diarrhea, nausea and vomiting. She has had lower abdominal pain intermittently for several months but became worse on Tuesday. Has had approximately 15+ bowel movements, black/bloody this morning. One episode of emesis yesterday, red tinged however had recently drank cranberry juice. Reports feeling dizzy and having leg weakness with ambulation for the last couple days. She denies fevers, chest pain, shortness of breath, dysuria, hematuria. Denies previous history of abdominal surgeries, GI bleed, ulcers. Denies EtOH abuse. Denies previous problems with low hemoglobin.  HPI  Past Medical History:  Diagnosis Date  . Hypercholesteremia   . Hypertension     There are no active problems to display for this patient.   Past Surgical History:  Procedure Laterality Date  . LEG SURGERY  1974   Blood Clot Removal     OB History    No data available       Home Medications    Prior to Admission medications   Medication Sig Start Date End Date Taking? Authorizing Provider  aspirin 81 MG tablet Take 81 mg by mouth daily.   Yes [provider]  cholecalciferol (VITAMIN D) 1000 units tablet Take 1,000 Units by mouth 2 (two) times daily.   Yes [provider]  lisinopril-hydrochlorothiazide (PRINZIDE,ZESTORETIC) 20-12.5 MG tablet Take 1 tablet by mouth daily.   Yes [provider]  simvastatin (ZOCOR) 20 MG tablet Take 1 tablet by mouth daily. 01/02/17  Yes [provider]    Family History No family history on file.  Social History Social History   Tobacco Use  . Smoking status: Current Every Day  Smoker  . Smokeless tobacco: Never Used  Substance Use Topics  . Alcohol use: No    Frequency: Never  . Drug use: No     Allergies   Patient has no known allergies.   Review of Systems Review of Systems  Constitutional: Positive for chills.  Gastrointestinal: Positive for abdominal pain, blood in stool, diarrhea (bloody), nausea and vomiting.  Skin: Positive for color change (pale).  Neurological: Positive for weakness ("leg") and light-headedness.     Physical Exam Updated Vital Signs BP (!) 102/49   Pulse 79   Temp 97.9 F (36.6 C) (Oral)   Resp (!) 24   Ht 5\' 3"  (1.6 m)   Wt 68 kg (150 lb)   SpO2 95%   BMI 26.57 kg/m   Physical Exam  Constitutional: She is oriented to person, place, and time. She appears well-developed and well-nourished. No distress.  Pale, appears uncomfortable. Alert and oriented to self, place, time and events.  HENT:  Head: Normocephalic and atraumatic.  Nose: Nose normal.  Mouth/Throat: No oropharyngeal exudate.  Dry lips and mucous membranes. Oropharynx and tonsils normal.  Eyes: Conjunctivae and EOM are normal. Pupils are equal, round, and reactive to light.  Neck: Normal range of motion.  Cardiovascular: Regular rhythm and intact distal pulses.  No murmur heard. Tachycardic. Hypotensive. 2+ DP and radial pulses bilaterally. No LE edema.   Pulmonary/Chest: Effort normal and breath sounds normal. No respiratory distress. She has no wheezes. She has no rales.  Abdominal: Soft. Bowel sounds are normal. There  is tenderness in the suprapubic area.  Suprapubic abdominal tenderness with deep pressure. No G/R/R. No CVA tenderness.   Genitourinary: Rectal exam shows guaiac positive stool.  Genitourinary Comments:  Rectal exam performed with supervising physician and RN in the room. Melanotic stool. No perianal masses, hemorrhoids, fissures or tenderness. Good rectal tone.  Musculoskeletal: Normal range of motion. She exhibits no deformity.    Neurological: She is alert and oriented to person, place, and time.  Skin: Skin is warm and dry. Capillary refill takes less than 2 seconds.  Pale. Dry, warm. No diaphoresis.  Psychiatric: She has a normal mood and affect. Her behavior is normal. Judgment and thought content normal.  Nursing note and vitals reviewed.    ED Treatments / Results  Labs (all labs ordered are listed, but only abnormal results are displayed) Labs Reviewed  COMPREHENSIVE METABOLIC PANEL - Abnormal; Notable for the following components:      Result Value   Potassium 2.6 (*)    Chloride 99 (*)    CO2 17 (*)    Glucose, Bld 225 (*)    BUN 116 (*)    Creatinine, Ser 1.49 (*)    Calcium 8.4 (*)    Total Protein 5.2 (*)    Albumin 2.1 (*)    AST 106 (*)    ALT 162 (*)    GFR calc non Af Amer 36 (*)    GFR calc Af Amer 42 (*)    Anion gap 22 (*)    All other components within normal limits  CBC - Abnormal; Notable for the following components:   WBC 16.2 (*)    RBC 2.58 (*)    Hemoglobin 8.0 (*)    HCT 22.8 (*)    All other components within normal limits  URINALYSIS, ROUTINE W REFLEX MICROSCOPIC - Abnormal; Notable for the following components:   APPearance CLOUDY (*)    Hgb urine dipstick LARGE (*)    Leukocytes, UA LARGE (*)    Bacteria, UA MANY (*)    Squamous Epithelial / LPF 0-5 (*)    All other components within normal limits  I-STAT CG4 LACTIC ACID, ED - Abnormal; Notable for the following components:   Lactic Acid, Venous 7.98 (*)    All other components within normal limits  POC OCCULT BLOOD, ED - Abnormal; Notable for the following components:   Fecal Occult Bld POSITIVE (*)    All other components within normal limits  I-STAT CHEM 8, ED - Abnormal; Notable for the following components:   Potassium 2.6 (*)    Chloride 100 (*)    BUN 100 (*)    Creatinine, Ser 1.30 (*)    Glucose, Bld 220 (*)    Calcium, Ion 1.09 (*)    TCO2 19 (*)    Hemoglobin 7.5 (*)    HCT 22.0 (*)    All  other components within normal limits  I-STAT CHEM 8, ED - Abnormal; Notable for the following components:   Potassium 2.8 (*)    BUN 85 (*)    Calcium, Ion 1.05 (*)    Hemoglobin 6.8 (*)    HCT 20.0 (*)    All other components within normal limits  CULTURE, BLOOD (ROUTINE X 2)  CULTURE, BLOOD (ROUTINE X 2)  URINE CULTURE  LIPASE, BLOOD  PROTIME-INR  APTT  I-STAT TROPONIN, ED  I-STAT CG4 LACTIC ACID, ED  TYPE AND SCREEN  PREPARE RBC (CROSSMATCH)  PREPARE FRESH FROZEN PLASMA  ABO/RH  EKG  EKG Interpretation  Date/Time:  Thursday April 02 2017 16:56:39 EST Ventricular Rate:  92 PR Interval:    QRS Duration: 171 QT Interval:  406 QTC Calculation: 503 R Axis:   86 Text Interpretation:  Sinus rhythm Atrial premature complex Nonspecific intraventricular conduction delay Minimal ST depression, inferior leads agree. no acute ischemic changes. no old comparison Confirmed by Charlesetta Shanks [id number]) on 04/02/2017 5:18:06 PM       Radiology Ct Abdomen Pelvis Wo Contrast  Result Date: 04/02/2017 CLINICAL DATA:  61 year old female with abdominal pain and diarrhea beginning 2 days ago. Hypotensive at physician's office today. Melena. EXAM: CT ABDOMEN AND PELVIS WITHOUT CONTRAST TECHNIQUE: Multidetector CT imaging of the abdomen and pelvis was performed following the standard protocol without IV contrast. COMPARISON:  Portable chest radiograph 1708 hr today. FINDINGS: Lower chest: Normal lung bases. No cardiomegaly, pericardial effusion or pleural effusion. Hepatobiliary: Negative noncontrast liver and gallbladder. Pancreas: Diminutive, a degree of pancreatic atrophy is suspected. Spleen: Negative. Adrenals/Urinary Tract: Mild bilateral adrenal gland thickening such as due to hyperplasia. No nephrolithiasis. The bilateral ureters and bilateral renal collecting systems are somewhat prominent, but appears symmetric. There is no calculus identified along the course of either ureter. Urinary  bladder is diminutive and unremarkable. There is no definite acute perinephric stranding. There is possibly a urethral diverticulum measuring about 12 millimeters on series 3, image 86. Stomach/Bowel: Decompressed rectum and sigmoid colon. Decompressed left colon and splenic flexure. Decompressed transverse colon and right colon which demonstrate some nonspecific fatty proliferation of the mucosa. Normal appendix. Decompressed terminal ileum. Upper limits of normal to mildly dilated distal small bowel loops upstream of the duodenum located in the right lower abdomen and pelvis (e.g. Series 3, images 60-67. There is a gradual transition to normal sized bowel loops both proximal and distal to these segments. No mesenteric stranding. Mildly flocculated material within some of the prominent loops. Other small bowel loops are nondilated and appear normal. Negative stomach and duodenum. No abdominal free air.  No free fluid. Vascular/Lymphatic: Aortoiliac calcified atherosclerosis. There is evidence of severe infrarenal aortic stenosis on series 3, image 33 related to bulky calcified plaque. Vascular patency is not evaluated in the absence of IV contrast. New line no lymphadenopathy identified. Reproductive: Surgically absent. Other: No pelvic free fluid. Musculoskeletal: No acute osseous abnormality identified. IMPRESSION: 1. Bulky calcified atherosclerosis of the infrarenal abdominal aorta suspicious for a high-grade aortic stenosis (infrarenal) on series 3, image 33. 2. Several mildly dilated distal small bowel loops, but no transition point. No associated mesenteric inflammation or free fluid. No inflamed large bowel identified. The appearance is nonspecific and might reflect ileus. In light of #1, bowel ischemia was considered, but there is no strong evidence of ischemic bowel at this time. 3. Mild symmetric appearing bilateral hydronephrosis and hydroureter with no obstructing etiology identified. Electronically  Signed   By: Genevie Ann M.D.   On: 04/02/2017 18:48   Dg Chest Port 1 View  Result Date: 04/02/2017 CLINICAL DATA:  Sepsis. Abdominal pain, vomiting, and diarrhea. Smoking history. EXAM: PORTABLE CHEST 1 VIEW COMPARISON:  None. FINDINGS: The cardiac silhouette is upper limits of normal in size. No airspace consolidation, edema, pleural effusion, pneumothorax is identified. No acute osseous abnormality is seen. IMPRESSION: No active disease. Electronically Signed   By: Logan Bores M.D.   On: 04/02/2017 17:34    Procedures Procedures (including critical care time)  CRITICAL CARE Performed by: Kinnie Feil   Total critical care time: 120  minutes  Critical care time was exclusive of separately billable procedures and treating other patients.  Critical care was necessary to treat or prevent imminent or life-threatening deterioration.  Critical care was time spent personally by me on the following activities: development of treatment plan with patient and/or surrogate as well as nursing, discussions with consultants, evaluation of patient's response to treatment, examination of patient, obtaining history from patient or surrogate, ordering and performing treatments and interventions, ordering and review of laboratory studies, ordering and review of radiographic studies, pulse oximetry and re-evaluation of patient's condition.  Medications Ordered in ED Medications  0.9 %  sodium chloride infusion (1,000 mLs Intravenous New Bag/Given 04/02/17 1550)  vancomycin (VANCOCIN) 500 mg in sodium chloride 0.9 % 100 mL IVPB (not administered)  piperacillin-tazobactam (ZOSYN) IVPB 3.375 g (not administered)  potassium chloride 10 mEq in 100 mL IVPB (10 mEq Intravenous New Bag/Given 04/02/17 1859)  potassium chloride 10 mEq in 100 mL IVPB (not administered)  polyethylene glycol-electrolytes (NuLYTELY/GoLYTELY) solution 4,000 mL (not administered)  piperacillin-tazobactam (ZOSYN) IVPB 3.375 g (0 g  Intravenous Stopped 04/02/17 1645)  vancomycin (VANCOCIN) IVPB 1000 mg/200 mL premix (0 mg Intravenous Stopped 04/02/17 1711)  sodium chloride 0.9 % bolus 1,000 mL (0 mLs Intravenous Stopped 04/02/17 1645)    And  sodium chloride 0.9 % bolus 1,000 mL (0 mLs Intravenous Stopped 04/02/17 1735)    And  sodium chloride 0.9 % bolus 250 mL (0 mLs Intravenous Stopped 04/02/17 1734)  pantoprazole (PROTONIX) injection 40 mg (40 mg Intravenous Given 04/02/17 1610)  famotidine (PEPCID) IVPB 20 mg premix (0 mg Intravenous Stopped 04/02/17 1655)  potassium chloride 10 mEq in 100 mL IVPB (0 mEq Intravenous Stopped 04/02/17 1921)  HYDROmorphone (DILAUDID) injection 0.5 mg (0.5 mg Intravenous Given 04/02/17 1652)     Initial Impression / Assessment and Plan / ED Course  I have reviewed the triage vital signs and the nursing notes.  Pertinent labs & imaging results that were available during my care of the patient were reviewed by me and considered in my medical decision making (see chart for details).  Clinical Course as of Apr 02 1952  Thu Apr 02, 2017  1553 Hemoglobin: (!) 8.0 [CG]  1553 Lactic Acid, Venous: (!!) 7.98 [CG]  1553 WBC: (!) 16.2 [CG]  1553 Fecal Occult Blood, POC: (!) POSITIVE [CG]  1612 Reevaluated patient. Vital signs have remained unchanged. Blood is in the room, RN attempting line.  [CG]  1656 Reevaluated patient, blood pressure has improved to 100/61. RN notified me she had dark/black-looking emesis.  [CG]  1720 Patient reevaluated before CT, blood pressure is 97 systolic. Awaiting GI consult.  [CG]  1745 ALT: (!) 162 [CG]  1748 AST: (!) 106 [CG]  1748 Spoke to GI in person, here in Ed, will see pt now.   [CG]  1902 Reevaluated patient after CT. Systolic blood pressure is in the low 100s. She has more color to her skin. Reports improvement in abdominal pain, no nausea. Discussed plan to wait for CT and admission, she is agreeable.  [CG]  1948 IMPRESSION: 1. Bulky calcified  atherosclerosis of the infrarenal abdominal aorta suspicious for a high-grade aortic stenosis (infrarenal) on series 3, image 33. 2. Several mildly dilated distal small bowel loops, but no transition point. No associated mesenteric inflammation or free fluid. No inflamed large bowel identified. The appearance is nonspecific and might reflect ileus. In light of #1, bowel ischemia was considered, but there is no strong evidence of ischemic bowel at  this time. 3. Mild symmetric appearing bilateral hydronephrosis and hydroureter with no obstructing etiology identified. CT Abdomen Pelvis Wo Contrast [CG]  1954 GI requesting golytely to start prep for colonoscopy, ordered.   [CG]    Clinical Course User Index [CG] Kinnie Feil, PA-C   Concern for lower GI bleed or infectious etiology. Given tachycardia, hypotension, tachypnea, bloody diarrhea, lactic acid at 7.9 code sepsis initiated. We'll wait for further lab work.   1615:  Leukocytosis at 16.2, hemoglobin 8.0, potassium 2.6, creatinine 1.3, positive Hemoccult with melanotic stools.  IV fluids, broad-spectrum antibiotics, protonic drip, Pepcid, transfusion RBCs initiated. We'll give Dilaudid for pain. Will await stabilizations before CT AP.   1949: Blood pressure has improved. Patient is on her second unit of blood. CT shows atherosclerosis of the infrarenal abdominal aorta, several mildly dilated small bowel loops without transition point. This raises suspicion for ileus. GI recommending double scope. GoLYTELY requested by GI ordered in the ED. Will request admission for GI bleed, urinary tract infection, acute kidney injury, hypokalemia, elevated LFTs, acute blood loss anemia. Patient shared with supervising physician. Final Clinical Impressions(s) / ED Diagnoses   Final diagnoses:  Gastrointestinal hemorrhage with melena    ED Discharge Orders    None       Arlean Hopping 04/02/17 Daphine Deutscher,  MD 04/07/17 [phone]

## 2017-04-02 NOTE — Consult Note (Signed)
Reason for Consult: Hematemesis, melena, and abdominal pain Referring Physician: [facility]  Andilynn Delavega HPI: This is a 62 year old female admitted for complaints of lower abdominal pain, melena, and hematemesis.  The patient reports having chronic lower abdominal pain, which worsened a couple of days ago.  She states that she has multiple dark and bowel movements that increasingly worsened.  Today in the ER she was witnessed to have hematemesis.  Her admission BP was hypotensive, but it did increase with fluid resuscitation.  WBC was identified to be at 16.2 and an HGB of 6.8.  A noncontrast CT scan shows bulky infrarenal abdominal aortic atherosclerosis, but there was no overt evidence of ischemia.  Her lactic acid was found to be at 7.89.  Zosyn was initiated.  There is no prior history of GI bleeding and a few years ago she had a normal screening colonoscopy in [place].  Past Medical History:  Diagnosis Date  . Hypercholesteremia   . Hypertension     Past Surgical History:  Procedure Laterality Date  . LEG SURGERY  1974   Blood Clot Removal     No family history on file.  Social History:  reports that she has been smoking.  she has never used smokeless tobacco. She reports that she does not drink alcohol or use drugs.  Allergies: No Known Allergies  Medications:  Scheduled: . pantoprazole (PROTONIX) IV  40 mg Intravenous Once  . polyethylene glycol-electrolytes  4,000 mL Oral Once   Continuous: . sodium chloride 1,000 mL (04/02/17 1550)  . pantoprozole (PROTONIX) infusion    . piperacillin-tazobactam (ZOSYN)  IV    . potassium chloride    . [START ON 04/03/2017] vancomycin      Results for orders placed or performed during the hospital encounter of 04/02/17 (from the past 24 hour(s))  Lipase, blood     Status: None   Collection Time: 04/02/17  2:59 PM  Result Value Ref Range   Lipase 17 11 - 51 U/L  Comprehensive metabolic panel     Status: Abnormal   Collection Time: 04/02/17  2:59 PM  Result Value Ref Range   Sodium 138 135 - 145 mmol/L   Potassium 2.6 (LL) 3.5 - 5.1 mmol/L   Chloride 99 (L) 101 - 111 mmol/L   CO2 17 (L) 22 - 32 mmol/L   Glucose, Bld 225 (H) 65 - 99 mg/dL   BUN 116 (H) 6 - 20 mg/dL   Creatinine, Ser 1.49 (H) 0.44 - 1.00 mg/dL   Calcium 8.4 (L) 8.9 - 10.3 mg/dL   Total Protein 5.2 (L) 6.5 - 8.1 g/dL   Albumin 2.1 (L) 3.5 - 5.0 g/dL   AST 106 (H) 15 - 41 U/L   ALT 162 (H) 14 - 54 U/L   Alkaline Phosphatase 118 38 - 126 U/L   Total Bilirubin 0.7 0.3 - 1.2 mg/dL   GFR calc non Af Amer 36 (L) >60 mL/min   GFR calc Af Amer 42 (L) >60 mL/min   Anion gap 22 (H) 5 - 15  CBC     Status: Abnormal   Collection Time: 04/02/17  2:59 PM  Result Value Ref Range   WBC 16.2 (H) 4.0 - 10.5 K/uL   RBC 2.58 (L) 3.87 - 5.11 MIL/uL   Hemoglobin 8.0 (L) 12.0 - 15.0 g/dL   HCT 22.8 (L) 36.0 - 46.0 %   MCV 88.4 78.0 - 100.0 fL   MCH 31.0 26.0 - 34.0 pg  MCHC 35.1 30.0 - 36.0 g/dL   RDW 13.9 11.5 - 15.5 %   Platelets 249 150 - 400 K/uL  Protime-INR     Status: None   Collection Time: 04/02/17  2:59 PM  Result Value Ref Range   Prothrombin Time 14.5 11.4 - 15.2 seconds   INR 1.14   I-Stat CG4 Lactic Acid, ED     Status: Abnormal   Collection Time: 04/02/17  3:18 PM  Result Value Ref Range   Lactic Acid, Venous 7.98 (HH) 0.5 - 1.9 mmol/L   Comment NOTIFIED PHYSICIAN   Type and screen [place]     Status: None (Preliminary result)   Collection Time: 04/02/17  3:44 PM  Result Value Ref Range   ABO/RH(D) AB POS    Antibody Screen NEG    Sample Expiration 04/05/2017    Unit Number [id number]    Blood Component Type RED CELLS,LR    Unit division 00    Status of Unit ISSUED    Unit tag comment VERBAL ORDERS PER DR PFEIFER    Transfusion Status OK TO TRANSFUSE    Crossmatch Result COMPATIBLE    Unit Number [id number]    Blood Component Type RED CELLS,LR    Unit division 00    Status of Unit REL  FROM [facility]    Unit tag comment VERBAL ORDERS PER DR PFEIFER    Transfusion Status OK TO TRANSFUSE    Crossmatch Result NOT NEEDED    Unit Number [id number]    Blood Component Type RED CELLS,LR    Unit division 00    Status of Unit ALLOCATED    Transfusion Status OK TO TRANSFUSE    Crossmatch Result Compatible    Unit Number [id number]    Blood Component Type RED CELLS,LR    Unit division 00    Status of Unit ALLOCATED    Transfusion Status OK TO TRANSFUSE    Crossmatch Result Compatible   ABO/Rh     Status: None   Collection Time: 04/02/17  3:44 PM  Result Value Ref Range   ABO/RH(D) AB POS   POC occult blood, ED     Status: Abnormal   Collection Time: 04/02/17  3:52 PM  Result Value Ref Range   Fecal Occult Bld POSITIVE (A) NEGATIVE  I-stat troponin, ED (not at [facility])     Status: None   Collection Time: 04/02/17  3:57 PM  Result Value Ref Range   Troponin i, poc 0.01 0.00 - 0.08 ng/mL   Comment 3          Prepare RBC     Status: None   Collection Time: 04/02/17  3:57 PM  Result Value Ref Range   Order Confirmation ORDER PROCESSED BY BLOOD BANK   Prepare fresh frozen plasma     Status: None   Collection Time: 04/02/17  3:57 PM  Result Value Ref Range   Unit Number [id number]    Blood Component Type LIQ PLASMA    Unit division 00    Status of Unit REL FROM [facility]    Unit tag comment VERBAL ORDERS PER DR PFEIFER    Transfusion Status OK TO TRANSFUSE    Unit Number [id number]    Blood Component Type LIQ PLASMA    Unit division 00    Status of Unit REL FROM [facility]    Unit tag comment VERBAL ORDERS PER DR PFEIFER    Transfusion Status OK TO TRANSFUSE   I-stat Chem 8,  ED     Status: Abnormal   Collection Time: 04/02/17  3:59 PM  Result Value Ref Range   Sodium 138 135 - 145 mmol/L   Potassium 2.6 (LL) 3.5 - 5.1 mmol/L   Chloride 100 (L) 101 - 111 mmol/L   BUN 100 (H) 6 - 20 mg/dL   Creatinine, Ser 1.30 (H) 0.44 - 1.00 mg/dL   Glucose, Bld 220 (H) 65 -  99 mg/dL   Calcium, Ion 1.09 (L) 1.15 - 1.40 mmol/L   TCO2 19 (L) 22 - 32 mmol/L   Hemoglobin 7.5 (L) 12.0 - 15.0 g/dL   HCT 22.0 (L) 36.0 - 46.0 %   Comment NOTIFIED PHYSICIAN   APTT     Status: None   Collection Time: 04/02/17  4:38 PM  Result Value Ref Range   aPTT 27 24 - 36 seconds  Urinalysis, Routine w reflex microscopic     Status: Abnormal   Collection Time: 04/02/17  5:05 PM  Result Value Ref Range   Color, Urine YELLOW YELLOW   APPearance CLOUDY (A) CLEAR   Specific Gravity, Urine 1.008 1.005 - 1.030   pH 5.0 5.0 - 8.0   Glucose, UA NEGATIVE NEGATIVE mg/dL   Hgb urine dipstick LARGE (A) NEGATIVE   Bilirubin Urine NEGATIVE NEGATIVE   Ketones, ur NEGATIVE NEGATIVE mg/dL   Protein, ur NEGATIVE NEGATIVE mg/dL   Nitrite NEGATIVE NEGATIVE   Leukocytes, UA LARGE (A) NEGATIVE   RBC / HPF 0-5 0 - 5 RBC/hpf   WBC, UA TOO NUMEROUS TO COUNT 0 - 5 WBC/hpf   Bacteria, UA MANY (A) NONE SEEN   Squamous Epithelial / LPF 0-5 (A) NONE SEEN   WBC Clumps PRESENT    Mucus PRESENT    Hyaline Casts, UA PRESENT   I-stat chem 8, ed     Status: Abnormal   Collection Time: 04/02/17  7:32 PM  Result Value Ref Range   Sodium 141 135 - 145 mmol/L   Potassium 2.8 (L) 3.5 - 5.1 mmol/L   Chloride 106 101 - 111 mmol/L   BUN 85 (H) 6 - 20 mg/dL   Creatinine, Ser 0.90 0.44 - 1.00 mg/dL   Glucose, Bld 93 65 - 99 mg/dL   Calcium, Ion 1.05 (L) 1.15 - 1.40 mmol/L   TCO2 22 22 - 32 mmol/L   Hemoglobin 6.8 (LL) 12.0 - 15.0 g/dL   HCT 20.0 (L) 36.0 - 46.0 %   Comment NOTIFIED PHYSICIAN   I-Stat CG4 Lactic Acid, ED     Status: None   Collection Time: 04/02/17  7:39 PM  Result Value Ref Range   Lactic Acid, Venous 1.05 0.5 - 1.9 mmol/L     Ct Abdomen Pelvis Wo Contrast  Result Date: 04/02/2017 CLINICAL DATA:  62 year old female with abdominal pain and diarrhea beginning 2 days ago. Hypotensive at physician's office today. Melena. EXAM: CT ABDOMEN AND PELVIS WITHOUT CONTRAST TECHNIQUE: Multidetector  CT imaging of the abdomen and pelvis was performed following the standard protocol without IV contrast. COMPARISON:  Portable chest radiograph 1708 hr today. FINDINGS: Lower chest: Normal lung bases. No cardiomegaly, pericardial effusion or pleural effusion. Hepatobiliary: Negative noncontrast liver and gallbladder. Pancreas: Diminutive, a degree of pancreatic atrophy is suspected. Spleen: Negative. Adrenals/Urinary Tract: Mild bilateral adrenal gland thickening such as due to hyperplasia. No nephrolithiasis. The bilateral ureters and bilateral renal collecting systems are somewhat prominent, but appears symmetric. There is no calculus identified along the course of either ureter. Urinary bladder is diminutive and  unremarkable. There is no definite acute perinephric stranding. There is possibly a urethral diverticulum measuring about 12 millimeters on series 3, image 86. Stomach/Bowel: Decompressed rectum and sigmoid colon. Decompressed left colon and splenic flexure. Decompressed transverse colon and right colon which demonstrate some nonspecific fatty proliferation of the mucosa. Normal appendix. Decompressed terminal ileum. Upper limits of normal to mildly dilated distal small bowel loops upstream of the duodenum located in the right lower abdomen and pelvis (e.g. Series 3, images 60-67. There is a gradual transition to normal sized bowel loops both proximal and distal to these segments. No mesenteric stranding. Mildly flocculated material within some of the prominent loops. Other small bowel loops are nondilated and appear normal. Negative stomach and duodenum. No abdominal free air.  No free fluid. Vascular/Lymphatic: Aortoiliac calcified atherosclerosis. There is evidence of severe infrarenal aortic stenosis on series 3, image 33 related to bulky calcified plaque. Vascular patency is not evaluated in the absence of IV contrast. New line no lymphadenopathy identified. Reproductive: Surgically absent. Other: No  pelvic free fluid. Musculoskeletal: No acute osseous abnormality identified. IMPRESSION: 1. Bulky calcified atherosclerosis of the infrarenal abdominal aorta suspicious for a high-grade aortic stenosis (infrarenal) on series 3, image 33. 2. Several mildly dilated distal small bowel loops, but no transition point. No associated mesenteric inflammation or free fluid. No inflamed large bowel identified. The appearance is nonspecific and might reflect ileus. In light of #1, bowel ischemia was considered, but there is no strong evidence of ischemic bowel at this time. 3. Mild symmetric appearing bilateral hydronephrosis and hydroureter with no obstructing etiology identified. Electronically Signed   By: Genevie Ann M.D.   On: 04/02/2017 18:48   Dg Chest Port 1 View  Result Date: 04/02/2017 CLINICAL DATA:  Sepsis. Abdominal pain, vomiting, and diarrhea. Smoking history. EXAM: PORTABLE CHEST 1 VIEW COMPARISON:  None. FINDINGS: The cardiac silhouette is upper limits of normal in size. No airspace consolidation, edema, pleural effusion, pneumothorax is identified. No acute osseous abnormality is seen. IMPRESSION: No active disease. Electronically Signed   By: Logan Bores M.D.   On: 04/02/2017 17:34    ROS:  As stated above in the HPI otherwise negative.  Blood pressure (!) 102/49, pulse 79, temperature 97.9 F (36.6 C), temperature source Oral, resp. rate (!) 24, height 5\' 3"  (1.6 m), weight 68 kg (150 lb), SpO2 95 %.    PE: Gen: Uncomfortable appearing, Alert and Oriented HEENT:  [place]/AT, EOMI Neck: Supple, no LAD Lungs: CTA Bilaterally CV: RRR without M/G/R ABM: Soft, NTND, +BS - She just received pain medication. Ext: No C/C/E  Assessment/Plan: 1) Anemia. 2) Melena. 3) Hematemesis. 4) Atherosclerosis. 5) Lower abdominal pain.   Further evaluation with an EGD/Colonoscopy will be pursued.  She thinks that she can take the prep for the colonoscopy.  Her lower abdominal pain improved with pain  medication, but is the concern for aortic stenosis.  There was no overt evidence of ischemic bowel, but this was a noncontrast scan.  Plan: 1) EGD/Colonoscopy tomorrow.  Anik Wesch D 04/02/2017, 8:01 PM

## 2017-04-03 ENCOUNTER — Encounter (HOSPITAL_COMMUNITY): Payer: Self-pay | Admitting: *Deleted

## 2017-04-03 ENCOUNTER — Inpatient Hospital Stay (HOSPITAL_COMMUNITY): Payer: BLUE CROSS/BLUE SHIELD | Admitting: Anesthesiology

## 2017-04-03 ENCOUNTER — Inpatient Hospital Stay (HOSPITAL_COMMUNITY): Payer: BLUE CROSS/BLUE SHIELD

## 2017-04-03 ENCOUNTER — Other Ambulatory Visit: Payer: Self-pay

## 2017-04-03 ENCOUNTER — Encounter (HOSPITAL_COMMUNITY)
Admission: EM | Disposition: A | Discharge status: Rehabilitation Facility | Payer: Self-pay | Source: Home / Self Care | Attending: Internal Medicine

## 2017-04-03 DIAGNOSIS — N814 Uterovaginal prolapse, unspecified: Secondary | ICD-10-CM

## 2017-04-03 DIAGNOSIS — R531 Weakness: Secondary | ICD-10-CM

## 2017-04-03 HISTORY — PX: ESOPHAGOGASTRODUODENOSCOPY: SHX5428

## 2017-04-03 LAB — BASIC METABOLIC PANEL
Anion gap: 9 (ref 5–15)
BUN: 73 mg/dL — ABNORMAL HIGH (ref 6–20)
CO2: 22 mmol/L (ref 22–32)
Calcium: 7.9 mg/dL — ABNORMAL LOW (ref 8.9–10.3)
Chloride: 111 mmol/L (ref 101–111)
Creatinine, Ser: 0.74 mg/dL (ref 0.44–1.00)
GFR calc Af Amer: >60 mL/min (ref >60)
GFR calc non Af Amer: >60 mL/min (ref >60)
Glucose, Bld: 103 mg/dL — ABNORMAL HIGH (ref 65–99)
Potassium: 3.3 mmol/L — ABNORMAL LOW (ref 3.5–5.1)
Sodium: 142 mmol/L (ref 135–145)

## 2017-04-03 LAB — PROTIME-INR
INR: 1.1
Prothrombin Time: 14.1 seconds (ref 11.4–15.2)

## 2017-04-03 LAB — CBC
HCT: 24.8 % — ABNORMAL LOW (ref 36.0–46.0)
Hemoglobin: 8.7 g/dL — ABNORMAL LOW (ref 12.0–15.0)
MCH: 30.1 pg (ref 26.0–34.0)
MCHC: 35.1 g/dL (ref 30.0–36.0)
MCV: 85.8 fL (ref 78.0–100.0)
Platelets: 200 10*3/uL (ref 150–400)
RBC: 2.89 MIL/uL — ABNORMAL LOW (ref 3.87–5.11)
RDW: 13.6 % (ref 11.5–15.5)
WBC: 12.3 10*3/uL — ABNORMAL HIGH (ref 4.0–10.5)

## 2017-04-03 LAB — BLOOD CULTURE ID PANEL (REFLEXED)

## 2017-04-03 LAB — URINE CULTURE

## 2017-04-03 LAB — LACTIC ACID, PLASMA: Lactic Acid, Venous: 0.8 mmol/L (ref 0.5–1.9)

## 2017-04-03 LAB — HIV ANTIBODY (ROUTINE TESTING W REFLEX): HIV Screen 4th Generation wRfx: NONREACTIVE

## 2017-04-03 LAB — MAGNESIUM: Magnesium: 2.3 mg/dL (ref 1.7–2.4)

## 2017-04-03 LAB — PHOSPHORUS: Phosphorus: 2.3 mg/dL — ABNORMAL LOW (ref 2.5–4.6)

## 2017-04-03 LAB — APTT: aPTT: 25 seconds (ref 24–36)

## 2017-04-03 LAB — MRSA PCR SCREENING: MRSA by PCR: NEGATIVE

## 2017-04-03 SURGERY — EGD (ESOPHAGOGASTRODUODENOSCOPY)
Anesthesia: Monitor Anesthesia Care

## 2017-04-03 MED ORDER — ENSURE ENLIVE PO LIQD
237.0000 mL | Freq: Two times a day (BID) | ORAL | Status: DC
  Administered 2017-04-04 – 2017-04-11 (×7): 237 mL via ORAL

## 2017-04-03 MED ORDER — DEXMEDETOMIDINE HCL 200 MCG/2ML IV SOLN
INTRAVENOUS | Status: DC | PRN
  Administered 2017-04-03 (×5): 8 ug via INTRAVENOUS

## 2017-04-03 MED ORDER — SODIUM CHLORIDE 0.9 % IV SOLN: INTRAVENOUS | Status: DC

## 2017-04-03 MED ORDER — PROPOFOL 500 MG/50ML IV EMUL
INTRAVENOUS | Status: DC | PRN
  Administered 2017-04-03: 75 ug/kg/min via INTRAVENOUS

## 2017-04-03 MED ORDER — IOPAMIDOL (ISOVUE-370) INJECTION 76%
INTRAVENOUS | Status: AC
  Administered 2017-04-03: 100 mL
  Filled 2017-04-03: qty 100

## 2017-04-03 MED ORDER — SODIUM CHLORIDE 0.9 % IV SOLN
Freq: Once | INTRAVENOUS | Status: AC
  Administered 2017-04-03: 11:00:00 via INTRAVENOUS

## 2017-04-03 MED ORDER — EPHEDRINE SULFATE 50 MG/ML IJ SOLN
INTRAMUSCULAR | Status: DC | PRN
  Administered 2017-04-03: 10 mg via INTRAVENOUS

## 2017-04-03 MED ORDER — POTASSIUM CHLORIDE 10 MEQ/100ML IV SOLN
10.0000 meq | INTRAVENOUS | Status: DC
Start: 2017-04-03 — End: 2017-04-03
  Administered 2017-04-03 (×3): 10 meq via INTRAVENOUS
  Filled 2017-04-03 (×4): qty 100

## 2017-04-03 MED ORDER — LIDOCAINE HCL (CARDIAC) 20 MG/ML IV SOLN
INTRAVENOUS | Status: DC | PRN
  Administered 2017-04-03: 60 mg via INTRATRACHEAL

## 2017-04-03 MED ORDER — PHENYLEPHRINE HCL 10 MG/ML IJ SOLN
INTRAMUSCULAR | Status: DC | PRN
  Administered 2017-04-03: 40 ug via INTRAVENOUS

## 2017-04-03 NOTE — Progress Notes (Signed)
PHARMACY - PHYSICIAN COMMUNICATION CRITICAL VALUE ALERT - BLOOD CULTURE IDENTIFICATION (BCID)  Results for orders placed or performed during the hospital encounter of 04/02/17  Blood Culture ID Panel (Reflexed) (Collected: 04/02/2017  3:40 PM)  Result Value Ref Range   Enterococcus species NOT DETECTED NOT DETECTED   Listeria monocytogenes NOT DETECTED NOT DETECTED   Staphylococcus species NOT DETECTED NOT DETECTED   Staphylococcus aureus NOT DETECTED NOT DETECTED   Streptococcus species NOT DETECTED NOT DETECTED   Streptococcus agalactiae NOT DETECTED NOT DETECTED   Streptococcus pneumoniae NOT DETECTED NOT DETECTED   Streptococcus pyogenes NOT DETECTED NOT DETECTED   Acinetobacter baumannii NOT DETECTED NOT DETECTED   Enterobacteriaceae species DETECTED (A) NOT DETECTED   Enterobacter cloacae complex NOT DETECTED NOT DETECTED   Escherichia coli DETECTED (A) NOT DETECTED   Klebsiella oxytoca NOT DETECTED NOT DETECTED   Klebsiella pneumoniae NOT DETECTED NOT DETECTED   Proteus species NOT DETECTED NOT DETECTED   Serratia marcescens NOT DETECTED NOT DETECTED   Carbapenem resistance NOT DETECTED NOT DETECTED   Haemophilus influenzae NOT DETECTED NOT DETECTED   Neisseria meningitidis NOT DETECTED NOT DETECTED   Pseudomonas aeruginosa NOT DETECTED NOT DETECTED   Candida albicans NOT DETECTED NOT DETECTED   Candida glabrata NOT DETECTED NOT DETECTED   Candida krusei NOT DETECTED NOT DETECTED   Candida parapsilosis NOT DETECTED NOT DETECTED   Candida tropicalis NOT DETECTED NOT DETECTED    Name of physician (or Provider) Contacted: X. Blount  Changes to prescribed antibiotics required: None, already on Zosyn, likely susceptible  [place] Crane, Vena [place], BCPS  Clinical Pharmacist Pager [phone]  04/03/2017 10:24 PM

## 2017-04-03 NOTE — Transfer of Care (Signed)
Immediate Anesthesia Transfer of Care Note  Patient: Kayla Wyatt  Procedure(s) Performed: ESOPHAGOGASTRODUODENOSCOPY (EGD) (N/A )  Patient Location: Endoscopy Unit  Anesthesia Type:MAC  Level of Consciousness: awake, alert  and oriented  Airway & Oxygen Therapy: Patient Spontanous Breathing and Patient connected to nasal cannula oxygen  Post-op Assessment: Report given to RN, Post -op Vital signs reviewed and stable and Patient moving all extremities X 4  Post vital signs: Reviewed and stable  Last Vitals:  Vitals:   04/03/17 0745 04/03/17 0902  BP:  (!) 124/46  Pulse: 66 73  Resp: (!) 21 20  Temp: 36.4 C 36.8 C  SpO2: 98% 97%    Last Pain:  Vitals:   04/03/17 0902  TempSrc: Oral  PainSc:          Complications: No apparent anesthesia complications

## 2017-04-03 NOTE — Progress Notes (Signed)
In recovery , patient hypotensive.  CRNA notified.  Meds given.  Patient had no c/o dizziness, lightheadedness, or other.  Continued to monitor patient in room.  Blood pressure recovered then started to trend down again.  Dr. Lissa Hoard notified.  Orders received for 500cc bolus.  Patient tolerating well.  Currently no complaints.  Will continue to monitor patient at bedside.  Laverta Baltimore, RN

## 2017-04-03 NOTE — Op Note (Signed)
[facility] Patient Name: Kayla Wyatt Procedure Date : 04/03/2017 MRN: [id number] Attending MD: Carol Ada , MD Date of Birth: March 16, 1955 CSN: [id number] Age: 62 Admit Type: Inpatient Procedure:                Upper GI endoscopy Indications:              Hematemesis Providers:                Carol Ada, MD, Laverta Baltimore RN, RN, Alan Mulder, Technician, Virgilio Belling. Beckner, CRNA Referring MD:              Medicines:                Propofol per Anesthesia Complications:            No immediate complications. Estimated Blood Loss:     Estimated blood loss: none. Procedure:                Pre-Anesthesia Assessment:                           - Prior to the procedure, a History and Physical                            was performed, and patient medications and                            allergies were reviewed. The patient's tolerance of                            previous anesthesia was also reviewed. The risks                            and benefits of the procedure and the sedation                            options and risks were discussed with the patient.                            All questions were answered, and informed consent                            was obtained. Prior Anticoagulants: The patient has                            taken no previous anticoagulant or antiplatelet                            agents. ASA Grade Assessment: II - A patient with                            mild systemic disease. After reviewing the risks  and benefits, the patient was deemed in                            satisfactory condition to undergo the procedure.                           - Sedation was administered by an anesthesia                            professional. Deep sedation was attained.                           After obtaining informed consent, the endoscope was                            passed under direct  vision. Throughout the                            procedure, the patient's blood pressure, pulse, and                            oxygen saturations were monitored continuously. The                            EG-[id number] ([id number]) scope was introduced through the                            mouth, and advanced to the second part of duodenum.                            The upper GI endoscopy was accomplished without                            difficulty. The patient tolerated the procedure                            well. Scope In: Scope Out: Findings:      The esophagus was normal.      A single 3 mm papule (nodule) with no bleeding and stigmata of recent       bleeding was found in the gastric body. For hemostasis, one hemostatic       clip was successfully placed (MR unsafe). There was no bleeding at the       end of the procedure. Estimated blood loss: none.      The examined duodenum was normal.      In the proximal gastric body there was evidence of a visible vessel with       surrounding mild ulceration. A minor amount of old blood was noted in       the fundus. A hemoclip was deployed across the vessel and a small amount       of blood was expressed, which confirmed that this was the site of       bleeding. Impression:               - Normal esophagus.                           -  A single visible vessel in the setting of a mild                            ulceration was found in the stomach. Clip (MR                            unsafe) was placed.                           - Normal examined duodenum.                           - No specimens collected. Moderate Sedation:      None Recommendation:           - Return patient to hospital ward for ongoing care.                           - Resume regular diet.                           - Continue present medications. Procedure Code(s):        --- Professional ---                           [phone], Esophagogastroduodenoscopy, flexible,                             transoral; with control of bleeding, any method Diagnosis Code(s):        --- Professional ---                           K31.89, Other diseases of stomach and duodenum                           K92.0, Hematemesis CPT copyright 2016 American [employer] Association. All rights reserved. The codes documented in this report are preliminary and upon coder review may  be revised to meet current compliance requirements. Carol Ada, MD Carol Ada, MD 04/03/2017 10:36:42 AM This report has been signed electronically. Number of Addenda: 0

## 2017-04-03 NOTE — Progress Notes (Signed)
Nutrition Follow-up  DOCUMENTATION CODES:   Not applicable  INTERVENTION:   -Ensure Enlive po BID, each supplement provides 350 kcal and 20 grams of protein  NUTRITION DIAGNOSIS:   Predicted suboptimal nutrient intake related to altered GI function, poor appetite as evidenced by per patient/family report.  GOAL:   Patient will meet greater than or equal to 90% of their needs  MONITOR:   PO intake, Supplement acceptance, Labs, Weight trends, Skin, I & O's  REASON FOR ASSESSMENT:   Malnutrition Screening Tool    ASSESSMENT:   Kayla Wyatt is a 61 y.o. female past medical history significant for high blood pressure high cholesterol presents with abdominal pain and diarrhea for about 6 days.  Patient states she had taken multiple doses daily of NSAIDs to relieve her pain.  Had one episode of vomiting the day before she came to the hospital.  And then had diarrhea with black stool the day she came to the emergency room.  Patient has no history of blood transfusion.  No history of GI bleed.  No history of ulcers.  Pt admitted with acute blood loss anemia.   1/25- s/p EGD, which revealed single viable vessel ulceration in stomach which was clipped  Spoke with pt and brother at bedside. Both confirm poor appetite over the past week related to stomach pain. Pt shares that she is generally very active, but missed work over the past few days due to feeling weak and unwell.   Pt typically has a good appetite, consuming 3 meals per day (Breakfast: sausage biscuit, Lunch: soup and sandwich, Dinner: spaghetti or pizza). Pt reports being very hungry and eager to eat lunch; diet was just advanced to regular.   Pt shares ongoing wt loss over the past 4 months, however, but unsure of why she is losing wt. Reports UBW around 150#. Reviewed wt hx from care everywhere, noted pt has experienced a 4.1% wt loss over the past month, which is not significant for time frame.   Discussed importance of  good meal and supplement intake to promote healing.   Labs reviewed: K: 3.3, Phos: 2.3.   NUTRITION - FOCUSED PHYSICAL EXAM:    Most Recent Value  Orbital Region  No depletion  Upper Arm Region  Mild depletion  Thoracic and Lumbar Region  No depletion  Buccal Region  No depletion  Temple Region  No depletion  Clavicle Bone Region  No depletion  Clavicle and Acromion Bone Region  No depletion  Scapular Bone Region  No depletion  Dorsal Hand  No depletion  Patellar Region  Mild depletion  Anterior Thigh Region  Mild depletion  Posterior Calf Region  Mild depletion  Edema (RD Assessment)  None  Hair  Reviewed  Eyes  Reviewed  Mouth  Reviewed  Skin  Reviewed  Nails  Reviewed       Diet Order:  Diet regular Room service appropriate? Yes; Fluid consistency: Thin  EDUCATION NEEDS:   Education needs have been addressed  Skin:  Skin Assessment: Reviewed RN Assessment  Last BM:  04/03/17  Height:   Ht Readings from Last 1 Encounters:  04/02/17 5\' 3"  (1.6 m)    Weight:   Wt Readings from Last 1 Encounters:  04/03/17 137 lb 2 oz (62.2 kg)    Ideal Body Weight:  52.3 kg  BMI:  Body mass index is 24.29 kg/m.  Estimated Nutritional Needs:   Kcal:  1600-1800  Protein:  80-95 grams  Fluid:  1.6-1.8 L  Westyn Driggers A. Jimmye Norman, RD, LDN, CDE Pager: [phone] After hours Pager: [phone]

## 2017-04-03 NOTE — Anesthesia Preprocedure Evaluation (Signed)
Anesthesia Evaluation  Patient identified by MRN, date of birth, ID band Patient awake    Reviewed: Allergy & Precautions, NPO status , Patient's Chart, lab work & pertinent test results  Airway Mallampati: II  TM Distance: >3 FB Neck ROM: Full    Dental no notable dental hx. (+) Edentulous Upper, Edentulous Lower, Missing, Chipped, Poor Dentition   Pulmonary Current Smoker,    Pulmonary exam normal breath sounds clear to auscultation       Cardiovascular hypertension, Normal cardiovascular exam Rhythm:Regular Rate:Normal     Neuro/Psych negative neurological ROS  negative psych ROS   GI/Hepatic negative GI ROS, Neg liver ROS,   Endo/Other  negative endocrine ROS  Renal/GU negative Renal ROS     Musculoskeletal negative musculoskeletal ROS (+)   Abdominal   Peds  Hematology negative hematology ROS (+) anemia ,   Anesthesia Other Findings   Reproductive/Obstetrics negative OB ROS                            Anesthesia Physical Anesthesia Plan  ASA: II  Anesthesia Plan: MAC   Post-op Pain Management:    Induction:   PONV Risk Score and Plan: 1 and Propofol infusion and Treatment may vary due to age or medical condition  Airway Management Planned:   Additional Equipment:   Intra-op Plan:   Post-operative Plan:   Informed Consent: I have reviewed the patients History and Physical, chart, labs and discussed the procedure including the risks, benefits and alternatives for the proposed anesthesia with the patient or authorized representative who has indicated his/her understanding and acceptance.   Dental advisory given  Plan Discussed with: CRNA  Anesthesia Plan Comments:         Anesthesia Quick Evaluation

## 2017-04-03 NOTE — Progress Notes (Signed)
PROGRESS NOTE  Kayla Wyatt OVF:[id number] DOB: 09-Mar-1956 DOA: 04/02/2017 PCP: [name], MD  HPI/Recap of past 24 hours: Kayla Wyatt is a 62 y.o. female past medical history significant for high blood pressure high cholesterol presents with abdominal pain and diarrhea for about 6 days. Recent use of NSAIDs. FOBT positive in the ED hg 6.4 requiring blood transfusion 2U PRBCs. GI consulted. Post EGD this am revealing a ulcerative vessel that was clipped.   04/03/17: patient seen and examined reports significant abdominal pain worse when she defecates. Severe uterine prolapse. Will contact gyn to evaluate. Patient states she started to notice it 6 months ago. States she has never been pregnant and never married.  Update: Contacted The faculty MD on call who stated that uterine prolapses are not painful and would not be the source of her pain. They will contact the patient to set up an appointment in the outpatient setting.  Assessment/Plan: Active Problems:   Acute blood loss anemia  Acute blood loss anemia in the setting of acute upper GI bleed -hg 8.4 post 2U prbcs from 6.8 at presentation -Fobt + -EGD done 04/03/17 revealed: A single visible vessel in the setting of a mild ulceration was found in the stomach. Clipped. -CTA abdomen to r/o ischemia -GI following; we appreciate recommendations  Intractable lower abdominal pain in the setting of upper GI bleed complicated by severe uterine prolapse -Consult gyn for recommendations  Severe uterine prolapse, poa -gyn to evaluate  Hypokalemia -Improving with repletion -K+ 3.3 from 2.6 -replete as indicated -BMP am  QT prolongation -Mg and Phos normal 04/03/17 -Avoid medication that would worsen QT prolongation  UTI, poa -Cultures taken -continue IV antibiotics  Hypertension -BP stable -Hold oral antihypertensive meds to avoid hypotension  Hyperlipidemia Restart statin   Code Status: full   Family  Communication: family member at her bedside   Disposition Plan: will stay another midnight for further testings and possible evaluation by gyn for severe uterine prolapse.    Consultants:  GI  gynecology   Procedures:  EGD 04/03/17  Antimicrobials:  IV zosyn and IV vancomycin  DVT prophylaxis:  SCDs   Objective: Vitals:   04/03/17 1100 04/03/17 1110 04/03/17 1120 04/03/17 1125  BP: (!) 96/26 (!) 97/34 (!) 97/34 (!) 104/35  Pulse: 66 67 62 66  Resp: (!) 23 18 (!) 25 20  Temp:      TempSrc:      SpO2: 97% 96% 97% 97%  Weight:      Height:        Intake/Output Summary (Last 24 hours) at 04/03/2017 1143 Last data filed at 04/03/2017 1020 Gross per 24 hour  Intake 3452.5 ml  Output 1100 ml  Net 2352.5 ml   Filed Weights   04/02/17 1457 04/02/17 2130 04/03/17 0500  Weight: 68 kg (150 lb) 60.7 kg (133 lb 13.1 oz) 60.7 kg (133 lb 13.1 oz)    Exam:   General:  62 yo CF WD WN appears uncomfortable due to lower abdominal pain   Cardiovascular: RRR no rubs or gallops  Respiratory: CTA no wheezes or rales  Abdomen: Tender to palpation at lower quadrants  Musculoskeletal: non focal   Skin: no noted rash  Psychiatry: Mood appropriate for condition and setting   Data Reviewed: CBC: Recent Labs  Lab 04/02/17 1459 04/02/17 1559 04/02/17 1932 04/03/17 0409  WBC 16.2*  --   --  12.3*  HGB 8.0* 7.5* 6.8* 8.7*  HCT 22.8* 22.0* 20.0* 24.8*  MCV 88.4  --   --  85.8  PLT 249  --   --  161   Basic Metabolic Panel: Recent Labs  Lab 04/02/17 1459 04/02/17 1559 04/02/17 1932 04/03/17 0409  NA 138 138 141 142  K 2.6* 2.6* 2.8* 3.3*  CL 99* 100* 106 111  CO2 17*  --   --  22  GLUCOSE 225* 220* 93 103*  BUN 116* 100* 85* 73*  CREATININE 1.49* 1.30* 0.90 0.74  CALCIUM 8.4*  --   --  7.9*  MG  --   --   --  2.3  PHOS  --   --   --  2.3*   GFR: Estimated Creatinine Clearance: 58.8 mL/min (by C-G formula based on SCr of 0.74 mg/dL). Liver Function  Tests: Recent Labs  Lab 04/02/17 1459  AST 106*  ALT 162*  ALKPHOS 118  BILITOT 0.7  PROT 5.2*  ALBUMIN 2.1*   Recent Labs  Lab 04/02/17 1459  LIPASE 17   No results for input(s): AMMONIA in the last 168 hours. Coagulation Profile: Recent Labs  Lab 04/02/17 1459 04/03/17 0409  INR 1.14 1.10   Cardiac Enzymes: No results for input(s): CKTOTAL, CKMB, CKMBINDEX, TROPONINI in the last 168 hours. BNP (last 3 results) No results for input(s): PROBNP in the last 8760 hours. HbA1C: No results for input(s): HGBA1C in the last 72 hours. CBG: No results for input(s): GLUCAP in the last 168 hours. Lipid Profile: No results for input(s): CHOL, HDL, LDLCALC, TRIG, CHOLHDL, LDLDIRECT in the last 72 hours. Thyroid Function Tests: No results for input(s): TSH, T4TOTAL, FREET4, T3FREE, THYROIDAB in the last 72 hours. Anemia Panel: No results for input(s): VITAMINB12, FOLATE, FERRITIN, TIBC, IRON, RETICCTPCT in the last 72 hours. Urine analysis:    Component Value Date/Time   COLORURINE YELLOW 04/02/2017 1705   APPEARANCEUR CLOUDY (A) 04/02/2017 1705   LABSPEC 1.008 04/02/2017 1705   PHURINE 5.0 04/02/2017 1705   GLUCOSEU NEGATIVE 04/02/2017 1705   HGBUR LARGE (A) 04/02/2017 1705   BILIRUBINUR NEGATIVE 04/02/2017 1705   KETONESUR NEGATIVE 04/02/2017 1705   PROTEINUR NEGATIVE 04/02/2017 1705   NITRITE NEGATIVE 04/02/2017 1705   LEUKOCYTESUR LARGE (A) 04/02/2017 1705   Sepsis Labs: @LABRCNTIP (procalcitonin:4,lacticidven:4)  ) Recent Results (from the past 240 hour(s))  MRSA PCR Screening     Status: None   Collection Time: 04/02/17  8:43 PM  Result Value Ref Range Status   MRSA by PCR NEGATIVE NEGATIVE Final    Comment:        The GeneXpert MRSA Assay (FDA approved for NASAL specimens only), is one component of a comprehensive MRSA colonization surveillance program. It is not intended to diagnose MRSA infection nor to guide or monitor treatment for MRSA infections.        Studies: Ct Abdomen Pelvis Wo Contrast  Result Date: 04/02/2017 CLINICAL DATA:  62 year old female with abdominal pain and diarrhea beginning 2 days ago. Hypotensive at physician's office today. Melena. EXAM: CT ABDOMEN AND PELVIS WITHOUT CONTRAST TECHNIQUE: Multidetector CT imaging of the abdomen and pelvis was performed following the standard protocol without IV contrast. COMPARISON:  Portable chest radiograph 1708 hr today. FINDINGS: Lower chest: Normal lung bases. No cardiomegaly, pericardial effusion or pleural effusion. Hepatobiliary: Negative noncontrast liver and gallbladder. Pancreas: Diminutive, a degree of pancreatic atrophy is suspected. Spleen: Negative. Adrenals/Urinary Tract: Mild bilateral adrenal gland thickening such as due to hyperplasia. No nephrolithiasis. The bilateral ureters and bilateral renal collecting systems are somewhat prominent, but appears symmetric. There is no calculus identified along the  course of either ureter. Urinary bladder is diminutive and unremarkable. There is no definite acute perinephric stranding. There is possibly a urethral diverticulum measuring about 12 millimeters on series 3, image 86. Stomach/Bowel: Decompressed rectum and sigmoid colon. Decompressed left colon and splenic flexure. Decompressed transverse colon and right colon which demonstrate some nonspecific fatty proliferation of the mucosa. Normal appendix. Decompressed terminal ileum. Upper limits of normal to mildly dilated distal small bowel loops upstream of the duodenum located in the right lower abdomen and pelvis (e.g. Series 3, images 60-67. There is a gradual transition to normal sized bowel loops both proximal and distal to these segments. No mesenteric stranding. Mildly flocculated material within some of the prominent loops. Other small bowel loops are nondilated and appear normal. Negative stomach and duodenum. No abdominal free air.  No free fluid. Vascular/Lymphatic: Aortoiliac  calcified atherosclerosis. There is evidence of severe infrarenal aortic stenosis on series 3, image 33 related to bulky calcified plaque. Vascular patency is not evaluated in the absence of IV contrast. New line no lymphadenopathy identified. Reproductive: Surgically absent. Other: No pelvic free fluid. Musculoskeletal: No acute osseous abnormality identified. IMPRESSION: 1. Bulky calcified atherosclerosis of the infrarenal abdominal aorta suspicious for a high-grade aortic stenosis (infrarenal) on series 3, image 33. 2. Several mildly dilated distal small bowel loops, but no transition point. No associated mesenteric inflammation or free fluid. No inflamed large bowel identified. The appearance is nonspecific and might reflect ileus. In light of #1, bowel ischemia was considered, but there is no strong evidence of ischemic bowel at this time. 3. Mild symmetric appearing bilateral hydronephrosis and hydroureter with no obstructing etiology identified. Electronically Signed   By: Genevie Ann M.D.   On: 04/02/2017 18:48   Dg Chest Port 1 View  Result Date: 04/03/2017 CLINICAL DATA:  Weakness, anemia, GI bleed EXAM: PORTABLE CHEST 1 VIEW COMPARISON:  Portable exam 0642 hours compared to 04/02/2017 FINDINGS: Normal heart size and pulmonary vascularity. Questionable abnormal density at the AP window, not contiguous with the silhouette of the descending thoracic aorta. Mild interstitial prominence in the mid to lower lungs, little changed. No segmental consolidation, pleural effusion or pneumothorax. Bones appear demineralized. IMPRESSION: Mild chronic interstitial changes without acute infiltrate. Question enlargement of the AP window; follow-up upright PA and lateral chest radiographs recommended to assess. Electronically Signed   By: Lavonia Dana M.D.   On: 04/03/2017 08:47   Dg Chest Port 1 View  Result Date: 04/02/2017 CLINICAL DATA:  Sepsis. Abdominal pain, vomiting, and diarrhea. Smoking history. EXAM: PORTABLE  CHEST 1 VIEW COMPARISON:  None. FINDINGS: The cardiac silhouette is upper limits of normal in size. No airspace consolidation, edema, pleural effusion, pneumothorax is identified. No acute osseous abnormality is seen. IMPRESSION: No active disease. Electronically Signed   By: Logan Bores M.D.   On: 04/02/2017 17:34    Scheduled Meds: . pantoprazole (PROTONIX) IV  40 mg Intravenous Once  . polyethylene glycol-electrolytes  4,000 mL Oral Once  . sodium chloride flush  3 mL Intravenous Q12H    Continuous Infusions: . sodium chloride 1,000 mL (04/02/17 1550)  . pantoprozole (PROTONIX) infusion 8 mg/hr (04/03/17 0700)  . piperacillin-tazobactam (ZOSYN)  IV 3.375 g (04/03/17 0634)  . vancomycin Stopped (04/03/17 0706)     LOS: 1 day     Kayleen Memos, MD [facility] Pager [phone]  If 7PM-7AM, please contact night-coverage [email or web address] Password [facility] 04/03/2017, 11:43 AM

## 2017-04-03 NOTE — Progress Notes (Signed)
Golytely held per MD. An attempt to inform gastroenterology was made twice.

## 2017-04-03 NOTE — H&P (Signed)
[facility] History and Physical  Kayla Wyatt OZH:[id number] DOB: Jul 26, 1955 DOA: 04/02/2017  Referring physician:  PCP: [name], MD   Chief Complaint: "I just felt weak."  HPI: Kayla Wyatt is a 62 y.o. female past medical history significant for high blood pressure high cholesterol presents with abdominal pain and diarrhea for about 6 days.  Patient states she had taken multiple doses daily of NSAIDs to relieve her pain.  Had one episode of vomiting the day before she came to the hospital.  And then had diarrhea with black stool the day she came to the emergency room.  Patient has no history of blood transfusion.  No history of GI bleed.  No history of ulcers.  ED course: EDP consulted to gastroenterology.  2 units of blood were ordered and started.  Hospitalist consulted for admission.  Review of Systems:  As per HPI otherwise 10 point review of systems negative.    Past Medical History:  Diagnosis Date  . Hypercholesteremia   . Hypertension   . Vitamin D deficiency    Past Surgical History:  Procedure Laterality Date  . LEG SURGERY  1974   Blood Clot Removal    Social History:  reports that she has been smoking.  she has never used smokeless tobacco. She reports that she does not drink alcohol or use drugs.  No Known Allergies  No family history on file.   Prior to Admission medications   Medication Sig Start Date End Date Taking? Authorizing Provider  aspirin 81 MG tablet Take 81 mg by mouth daily.   Yes [provider]  cholecalciferol (VITAMIN D) 1000 units tablet Take 1,000 Units by mouth 2 (two) times daily.   Yes [provider]  lisinopril-hydrochlorothiazide (PRINZIDE,ZESTORETIC) 20-12.5 MG tablet Take 1 tablet by mouth daily.   Yes [provider]  simvastatin (ZOCOR) 20 MG tablet Take 1 tablet by mouth daily. 01/02/17  Yes [provider]   Physical Exam: Vitals:   04/02/17 2045 04/02/17 2130 04/02/17 2259  04/02/17 2332  BP: (!) 97/42 104/73 (!) 102/56 (!) 101/42  Pulse: 74 84 87   Resp:  (!) 29 (!) 26   Temp:  97.8 F (36.6 C) 98.5 F (36.9 C) 98 F (36.7 C)  TempSrc:  Oral Oral Oral  SpO2: 95% 97%    Weight:  60.7 kg (133 lb 13.1 oz)    Height:  5\' 3"  (1.6 m)      Wt Readings from Last 3 Encounters:  04/02/17 60.7 kg (133 lb 13.1 oz)    General:  Appears calm and comfortable; A&Ox3 Eyes:  PERRL, EOMI, normal lids, iris ENT:  grossly normal hearing, lips & tongue Neck:  no LAD, masses or thyromegaly Cardiovascular:  RRR, no m/r/g. No LE edema.  Respiratory:  CTA bilaterally, no w/r/r. Normal respiratory effort. Abdomen:  soft, ntnd Skin:  no rash or induration seen on limited exam Musculoskeletal:  grossly normal tone BUE/BLE Psychiatric:  grossly normal mood and affect, speech fluent and appropriate Neurologic:  CN 2-12 grossly intact, moves all extremities in coordinated fashion.          Labs on Admission:  Basic Metabolic Panel: Recent Labs  Lab 04/02/17 1459 04/02/17 1559 04/02/17 1932  NA 138 138 141  K 2.6* 2.6* 2.8*  CL 99* 100* 106  CO2 17*  --   --   GLUCOSE 225* 220* 93  BUN 116* 100* 85*  CREATININE 1.49* 1.30* 0.90  CALCIUM 8.4*  --   --  Liver Function Tests: Recent Labs  Lab 04/02/17 1459  AST 106*  ALT 162*  ALKPHOS 118  BILITOT 0.7  PROT 5.2*  ALBUMIN 2.1*   Recent Labs  Lab 04/02/17 1459  LIPASE 17   No results for input(s): AMMONIA in the last 168 hours. CBC: Recent Labs  Lab 04/02/17 1459 04/02/17 1559 04/02/17 1932  WBC 16.2*  --   --   HGB 8.0* 7.5* 6.8*  HCT 22.8* 22.0* 20.0*  MCV 88.4  --   --   PLT 249  --   --    Cardiac Enzymes: No results for input(s): CKTOTAL, CKMB, CKMBINDEX, TROPONINI in the last 168 hours.  BNP (last 3 results) No results for input(s): BNP in the last 8760 hours.  ProBNP (last 3 results) No results for input(s): PROBNP in the last 8760 hours.   Serum creatinine: 0.9 mg/dL 04/02/17  1932 Estimated creatinine clearance: 52.2 mL/min  CBG: No results for input(s): GLUCAP in the last 168 hours.  Radiological Exams on Admission: Ct Abdomen Pelvis Wo Contrast  Result Date: 04/02/2017 CLINICAL DATA:  62 year old female with abdominal pain and diarrhea beginning 2 days ago. Hypotensive at physician's office today. Melena. EXAM: CT ABDOMEN AND PELVIS WITHOUT CONTRAST TECHNIQUE: Multidetector CT imaging of the abdomen and pelvis was performed following the standard protocol without IV contrast. COMPARISON:  Portable chest radiograph 1708 hr today. FINDINGS: Lower chest: Normal lung bases. No cardiomegaly, pericardial effusion or pleural effusion. Hepatobiliary: Negative noncontrast liver and gallbladder. Pancreas: Diminutive, a degree of pancreatic atrophy is suspected. Spleen: Negative. Adrenals/Urinary Tract: Mild bilateral adrenal gland thickening such as due to hyperplasia. No nephrolithiasis. The bilateral ureters and bilateral renal collecting systems are somewhat prominent, but appears symmetric. There is no calculus identified along the course of either ureter. Urinary bladder is diminutive and unremarkable. There is no definite acute perinephric stranding. There is possibly a urethral diverticulum measuring about 12 millimeters on series 3, image 86. Stomach/Bowel: Decompressed rectum and sigmoid colon. Decompressed left colon and splenic flexure. Decompressed transverse colon and right colon which demonstrate some nonspecific fatty proliferation of the mucosa. Normal appendix. Decompressed terminal ileum. Upper limits of normal to mildly dilated distal small bowel loops upstream of the duodenum located in the right lower abdomen and pelvis (e.g. Series 3, images 60-67. There is a gradual transition to normal sized bowel loops both proximal and distal to these segments. No mesenteric stranding. Mildly flocculated material within some of the prominent loops. Other small bowel loops are  nondilated and appear normal. Negative stomach and duodenum. No abdominal free air.  No free fluid. Vascular/Lymphatic: Aortoiliac calcified atherosclerosis. There is evidence of severe infrarenal aortic stenosis on series 3, image 33 related to bulky calcified plaque. Vascular patency is not evaluated in the absence of IV contrast. New line no lymphadenopathy identified. Reproductive: Surgically absent. Other: No pelvic free fluid. Musculoskeletal: No acute osseous abnormality identified. IMPRESSION: 1. Bulky calcified atherosclerosis of the infrarenal abdominal aorta suspicious for a high-grade aortic stenosis (infrarenal) on series 3, image 33. 2. Several mildly dilated distal small bowel loops, but no transition point. No associated mesenteric inflammation or free fluid. No inflamed large bowel identified. The appearance is nonspecific and might reflect ileus. In light of #1, bowel ischemia was considered, but there is no strong evidence of ischemic bowel at this time. 3. Mild symmetric appearing bilateral hydronephrosis and hydroureter with no obstructing etiology identified. Electronically Signed   By: Genevie Ann M.D.   On: 04/02/2017 18:48  Dg Chest Port 1 View  Result Date: 04/02/2017 CLINICAL DATA:  Sepsis. Abdominal pain, vomiting, and diarrhea. Smoking history. EXAM: PORTABLE CHEST 1 VIEW COMPARISON:  None. FINDINGS: The cardiac silhouette is upper limits of normal in size. No airspace consolidation, edema, pleural effusion, pneumothorax is identified. No acute osseous abnormality is seen. IMPRESSION: No active disease. Electronically Signed   By: Logan Bores M.D.   On: 04/02/2017 17:34    EKG: Independently reviewed. NSR. Prolonged QT.  Assessment/Plan Active Problems:   Acute blood loss anemia  Acute blood loss anemia 6.8 hgb is lowest since coming to ED Melanotic diarrhea in ED per EDP Asked nurse to hold laxative requested by GI until 2nd unit of blood in, nurse will then contact GI  about order Placed on protonix drip Pt got large vol resuscitation  in ED will check CXR in AM for fluid  Low K, 2.6 will replace IV 63meq in ED  Long QT Check Mg and Phos Replace K Check EKG in AM  UTI Appeared septic on admit Patient hemodynamically stable Given [facility] emergency room, will continue Given Zosyn in the emergency room, will continue Urine culture pending Blood cultures 2 pending Patient given 2500 mL of fluid in the emergency room Lactic acid normal, was elevated in ED  Hypertension When necessary hydralazine 10 mg IV as needed for severe blood pressure Hold oral meds  Hyperlipidemia Hold statin  Code Status: FC  DVT Prophylaxis: SCDs Family Communication: none available Disposition Plan: Pending Improvement  Status: Inpt tele  Elwin Mocha, MD Family Medicine [facility] [email or web address] Password [email or web address]

## 2017-04-03 NOTE — Interval H&P Note (Signed)
History and Physical Interval Note:  04/03/2017 10:02 AM  Kayla Wyatt  has presented today for surgery, with the diagnosis of Hematemesis, ABM pain, and melena  The various methods of treatment have been discussed with the patient and family. After consideration of risks, benefits and other options for treatment, the patient has consented to  Procedure(s): ESOPHAGOGASTRODUODENOSCOPY (EGD) (N/A) as a surgical intervention .  The patient's history has been reviewed, patient examined, no change in status, stable for surgery.  I have reviewed the patient's chart and labs.  Questions were answered to the patient's satisfaction.     Dondrea Clendenin D

## 2017-04-03 NOTE — Anesthesia Postprocedure Evaluation (Signed)
Anesthesia Post Note  Patient: Kayla Wyatt  Procedure(s) Performed: ESOPHAGOGASTRODUODENOSCOPY (EGD) (N/A )     Patient location during evaluation: PACU Anesthesia Type: MAC Level of consciousness: awake and alert Pain management: pain level controlled Vital Signs Assessment: post-procedure vital signs reviewed and stable Respiratory status: spontaneous breathing Cardiovascular status: stable Anesthetic complications: no    Last Vitals:  Vitals:   04/03/17 1125 04/03/17 1341  BP: (!) 104/35 111/63  Pulse: 66 83  Resp: 20 20  Temp:  (!) 36.3 C  SpO2: 97% 98%    Last Pain:  Vitals:   04/03/17 1341  TempSrc: Oral  PainSc:                  [name]

## 2017-04-03 NOTE — Progress Notes (Signed)
1630 Bedside shift report from [place], Therapist, sports. Pt resting in bed, A&Ox4. Denies pain, stated she feels weak and tired. Updated pt with POC. NAD, VSS, WCTM.  1800 CT called on way to p/u.  1815 Pt down via bed for cat scan

## 2017-04-04 DIAGNOSIS — R1030 Lower abdominal pain, unspecified: Secondary | ICD-10-CM | POA: Diagnosis present

## 2017-04-04 DIAGNOSIS — K921 Melena: Secondary | ICD-10-CM | POA: Diagnosis present

## 2017-04-04 DIAGNOSIS — D62 Acute posthemorrhagic anemia: Secondary | ICD-10-CM

## 2017-04-04 DIAGNOSIS — R7881 Bacteremia: Secondary | ICD-10-CM

## 2017-04-04 DIAGNOSIS — K250 Acute gastric ulcer with hemorrhage: Principal | ICD-10-CM

## 2017-04-04 DIAGNOSIS — R935 Abnormal findings on diagnostic imaging of other abdominal regions, including retroperitoneum: Secondary | ICD-10-CM | POA: Diagnosis present

## 2017-04-04 DIAGNOSIS — D649 Anemia, unspecified: Secondary | ICD-10-CM

## 2017-04-04 DIAGNOSIS — E876 Hypokalemia: Secondary | ICD-10-CM

## 2017-04-04 LAB — CBC
HCT: 23.5 % — ABNORMAL LOW (ref 36.0–46.0)
Hemoglobin: 8.1 g/dL — ABNORMAL LOW (ref 12.0–15.0)
MCH: 30.1 pg (ref 26.0–34.0)
MCHC: 34.5 g/dL (ref 30.0–36.0)
MCV: 87.4 fL (ref 78.0–100.0)
Platelets: 242 10*3/uL (ref 150–400)
RBC: 2.69 MIL/uL — ABNORMAL LOW (ref 3.87–5.11)
RDW: 14.8 % (ref 11.5–15.5)
WBC: 8.8 10*3/uL (ref 4.0–10.5)

## 2017-04-04 LAB — BASIC METABOLIC PANEL
Anion gap: 9 (ref 5–15)
BUN: 19 mg/dL (ref 6–20)
CO2: 24 mmol/L (ref 22–32)
Calcium: 8.2 mg/dL — ABNORMAL LOW (ref 8.9–10.3)
Chloride: 114 mmol/L — ABNORMAL HIGH (ref 101–111)
Creatinine, Ser: 0.69 mg/dL (ref 0.44–1.00)
GFR calc Af Amer: >60 mL/min (ref >60)
GFR calc non Af Amer: >60 mL/min (ref >60)
Glucose, Bld: 108 mg/dL — ABNORMAL HIGH (ref 65–99)
Potassium: 3.1 mmol/L — ABNORMAL LOW (ref 3.5–5.1)
Sodium: 147 mmol/L — ABNORMAL HIGH (ref 135–145)

## 2017-04-04 MED ORDER — SIMVASTATIN 20 MG PO TABS
20.0000 mg | ORAL_TABLET | Freq: Every day | ORAL | Status: DC
  Administered 2017-04-04 – 2017-04-11 (×8): 20 mg via ORAL
  Filled 2017-04-04 (×9): qty 1

## 2017-04-04 MED ORDER — POTASSIUM CHLORIDE CRYS ER 20 MEQ PO TBCR
40.0000 meq | EXTENDED_RELEASE_TABLET | Freq: Three times a day (TID) | ORAL | Status: AC
  Administered 2017-04-04 – 2017-04-05 (×3): 40 meq via ORAL
  Filled 2017-04-04 (×3): qty 2

## 2017-04-04 MED ORDER — DEXTROSE 5 % IV SOLN
2.0000 g | INTRAVENOUS | Status: DC
  Administered 2017-04-04 – 2017-04-11 (×8): 2 g via INTRAVENOUS
  Filled 2017-04-04 (×8): qty 2

## 2017-04-04 NOTE — Progress Notes (Addendum)
GI Progress Note covering for Drs, Collene Mares & [name]    Subjective  Lower abdominal pain has resolved. Melena has decreased.    Objective  Vital signs in last 24 hours: Temp:  [97.3 F (36.3 C)-98.2 F (36.8 C)] 97.6 F (36.4 C) (01/26 1614) Pulse Rate:  [70-95] 82 (01/26 1614) Resp:  [17-35] 27 (01/26 1614) BP: (112-155)/(44-101) 155/47 (01/26 1614) SpO2:  [96 %-98 %] 97 % (01/26 1614) Weight:  [134 lb 4.2 oz (60.9 kg)-137 lb 2 oz (62.2 kg)] 134 lb 4.2 oz (60.9 kg) (01/26 0641) Last BM Date: 04/04/17  General: Alert, well-developed, in NAD Heart:  Regular rate and rhythm; no murmurs Chest: Clear to ascultation bilaterally Abdomen:  Soft, nontender and nondistended. Normal bowel sounds, without guarding, and without rebound.   Extremities:  Without edema. Neurologic:  Alert and  oriented x4; grossly normal neurologically. Psych:  Alert and cooperative. Normal mood and affect.  Intake/Output from previous day: 01/25 0701 - 01/26 0700 In: 3673.3 [P.O.:240; I.V.:2983.3; IV Piggyback:450] Out: 2 [Urine:1; Stool:1] Intake/Output this shift: Total I/O In: 1780 [P.O.:480; I.V.:1200; IV Piggyback:100] Out: -   Lab Results: Recent Labs    04/02/17 1459  04/02/17 1932 04/03/17 0409 04/04/17 0654  WBC 16.2*  --   --  12.3* 8.8  HGB 8.0*   < > 6.8* 8.7* 8.1*  HCT 22.8*   < > 20.0* 24.8* 23.5*  PLT 249  --   --  200 242   < > = values in this interval not displayed.   BMET Recent Labs    04/02/17 1459  04/02/17 1932 04/03/17 0409 04/04/17 0654  NA 138   < > 141 142 147*  K 2.6*   < > 2.8* 3.3* 3.1*  CL 99*   < > 106 111 114*  CO2 17*  --   --  22 24  GLUCOSE 225*   < > 93 103* 108*  BUN 116*   < > 85* 73* 19  CREATININE 1.49*   < > 0.90 0.74 0.69  CALCIUM 8.4*  --   --  7.9* 8.2*   < > = values in this interval not displayed.   LFT Recent Labs    04/02/17 1459  PROT 5.2*  ALBUMIN 2.1*  AST 106*  ALT 162*  ALKPHOS 118  BILITOT 0.7   PT/INR Recent Labs     04/02/17 1459 04/03/17 0409  LABPROT 14.5 14.1  INR 1.14 1.10    Studies/Results: Ct Abdomen Pelvis Wo Contrast  Result Date: 04/02/2017 CLINICAL DATA:  62 year old female with abdominal pain and diarrhea beginning 2 days ago. Hypotensive at physician's office today. Melena. EXAM: CT ABDOMEN AND PELVIS WITHOUT CONTRAST TECHNIQUE: Multidetector CT imaging of the abdomen and pelvis was performed following the standard protocol without IV contrast. COMPARISON:  Portable chest radiograph 1708 hr today. FINDINGS: Lower chest: Normal lung bases. No cardiomegaly, pericardial effusion or pleural effusion. Hepatobiliary: Negative noncontrast liver and gallbladder. Pancreas: Diminutive, a degree of pancreatic atrophy is suspected. Spleen: Negative. Adrenals/Urinary Tract: Mild bilateral adrenal gland thickening such as due to hyperplasia. No nephrolithiasis. The bilateral ureters and bilateral renal collecting systems are somewhat prominent, but appears symmetric. There is no calculus identified along the course of either ureter. Urinary bladder is diminutive and unremarkable. There is no definite acute perinephric stranding. There is possibly a urethral diverticulum measuring about 12 millimeters on series 3, image 86. Stomach/Bowel: Decompressed rectum and sigmoid colon. Decompressed left colon and splenic flexure. Decompressed transverse colon and  right colon which demonstrate some nonspecific fatty proliferation of the mucosa. Normal appendix. Decompressed terminal ileum. Upper limits of normal to mildly dilated distal small bowel loops upstream of the duodenum located in the right lower abdomen and pelvis (e.g. Series 3, images 60-67. There is a gradual transition to normal sized bowel loops both proximal and distal to these segments. No mesenteric stranding. Mildly flocculated material within some of the prominent loops. Other small bowel loops are nondilated and appear normal. Negative stomach and duodenum.  No abdominal free air.  No free fluid. Vascular/Lymphatic: Aortoiliac calcified atherosclerosis. There is evidence of severe infrarenal aortic stenosis on series 3, image 33 related to bulky calcified plaque. Vascular patency is not evaluated in the absence of IV contrast. New line no lymphadenopathy identified. Reproductive: Surgically absent. Other: No pelvic free fluid. Musculoskeletal: No acute osseous abnormality identified. IMPRESSION: 1. Bulky calcified atherosclerosis of the infrarenal abdominal aorta suspicious for a high-grade aortic stenosis (infrarenal) on series 3, image 33. 2. Several mildly dilated distal small bowel loops, but no transition point. No associated mesenteric inflammation or free fluid. No inflamed large bowel identified. The appearance is nonspecific and might reflect ileus. In light of #1, bowel ischemia was considered, but there is no strong evidence of ischemic bowel at this time. 3. Mild symmetric appearing bilateral hydronephrosis and hydroureter with no obstructing etiology identified. Electronically Signed   By: Genevie Ann M.D.   On: 04/02/2017 18:48   Dg Chest Port 1 View  Result Date: 04/03/2017 CLINICAL DATA:  Weakness, anemia, GI bleed EXAM: PORTABLE CHEST 1 VIEW COMPARISON:  Portable exam 0642 hours compared to 04/02/2017 FINDINGS: Normal heart size and pulmonary vascularity. Questionable abnormal density at the AP window, not contiguous with the silhouette of the descending thoracic aorta. Mild interstitial prominence in the mid to lower lungs, little changed. No segmental consolidation, pleural effusion or pneumothorax. Bones appear demineralized. IMPRESSION: Mild chronic interstitial changes without acute infiltrate. Question enlargement of the AP window; follow-up upright PA and lateral chest radiographs recommended to assess. Electronically Signed   By: Lavonia Dana M.D.   On: 04/03/2017 08:47   Dg Chest Port 1 View  Result Date: 04/02/2017 CLINICAL DATA:  Sepsis.  Abdominal pain, vomiting, and diarrhea. Smoking history. EXAM: PORTABLE CHEST 1 VIEW COMPARISON:  None. FINDINGS: The cardiac silhouette is upper limits of normal in size. No airspace consolidation, edema, pleural effusion, pneumothorax is identified. No acute osseous abnormality is seen. IMPRESSION: No active disease. Electronically Signed   By: Logan Bores M.D.   On: 04/02/2017 17:34   Ct Angio Abd/pel W/ And/or W/o  Result Date: 04/04/2017 CLINICAL DATA:  62 year old female with a history of ischemia. Possible ischemic bowel. EXAM: CTA ABDOMEN AND PELVIS wITHOUT AND WITH CONTRAST TECHNIQUE: Multidetector CT imaging of the abdomen and pelvis was performed using the standard protocol during bolus administration of intravenous contrast. Multiplanar reconstructed images and MIPs were obtained and reviewed to evaluate the vascular anatomy. CONTRAST:  <See Chart> ISOVUE-370 IOPAMIDOL (ISOVUE-370) INJECTION 76% COMPARISON:  04/02/2017 FINDINGS: VASCULAR Aorta: Mild atherosclerotic changes of the distal thoracic aorta. Greatest diameter at the aortic hiatus measures 19 mm. Advanced mixed calcified and soft plaque of the infrarenal abdominal aorta high-grade stenosis near the origin of the inferior mesenteric artery secondary to mixed calcified and soft plaque. Small caliber aorta, measuring 13 mm at this site. No periaortic fluid. No aneurysm. Celiac: There is a separate origin of the left gastric artery just above the celiac artery origin. There is  approximately 50% stenosis at the origin of the left gastric artery. Mild soft atherosclerotic plaque at the origin of the celiac artery without significant stenosis on coronal reformatted images. Splenic artery demonstrates high-grade stenosis in the proximal third secondary to calcified plaque. Atherosclerotic changes of the common hepatic artery, resulting in stenosis proximal to the GDA origin with poststenotic dilation. SMA: Minimal atherosclerotic changes at the  origin of the superior mesenteric artery. A proximal rightward branch of the superior mesenteric artery is enlarged, representative of likely middle colic artery and collateral flow to the inferior mesenteric artery (meandering artery of [place]owitz/arc of Riolan). Renals: Main right renal artery with significant atherosclerotic changes, likely greater than 50% stenosis at the origin. There is a small accessory right renal artery just below the main right renal artery. There are 3 left renal arteries, the most superior 2 of which demonstrate approximately 50% stenosis at the origin. A small inferior left renal artery to the lower pole is patent. IMA: Inferior mesenteric artery demonstrates significant stenosis at the origin though remains patent at the origin. The origin is at the site of greatest stenosis of the abdominal aorta. Right lower extremity: Moderate mixed calcified and soft plaque of the right iliac artery with no aneurysm or dissection. Hypogastric arteries patent. Minimal atherosclerotic changes of the external iliac artery and common femoral artery. Proximal profunda femoris and SFA are patent. Left lower extremity: Moderate mixed calcified and soft plaque of the left common iliac artery without significant stenosis or occlusion. No dissection or aneurysm. Hypogastric arteries patent. Minimal atherosclerotic changes of the external iliac artery and common femoral artery. Proximal left SFA and profunda femoris are patent. Veins: Unremarkable appearance of the venous system. Review of the MIP images confirms the above findings. NON-VASCULAR Lower chest: Unremarkable. Hepatobiliary: Unremarkable appearance of the liver. Unremarkable gall bladder. Pancreas: Unremarkable pancreas Spleen: Unremarkable spleen Adrenals/Urinary Tract: Unremarkable appearance of the right adrenal gland. Nodular contour of the lateral limb of the left adrenal gland, with no focal nodule identified. Right: Symmetric perfusion of  the bilateral kidneys. No right-sided hydronephrosis. No nephrolithiasis. Left: Symmetric perfusion of the left kidney. No left-sided hydronephrosis or nephrolithiasis. Unremarkable course of the left ureter. Small urethrocele Stomach/Bowel: Unremarkable stomach. No abnormal distention of small bowel. No transition point. The mucosa of small bowel and colon appears to enhance throughout the length of bowel. No dilation. No evidence of free air or significant free intraperitoneal fluid. Normal appendix. Colonic diverticula are present without associated inflammatory changes. No large stool burden. Fluid filled colon. Lymphatic: No adenopathy. There are small lymph nodes in the periaortic/preaortic nodal station. Mesenteric: No free fluid or air. No adenopathy. Reproductive: Nonspecific pelvic floor laxity.  Hysterectomy. Other: No hernia. Musculoskeletal: Multilevel degenerative changes of the spine. No displaced fracture. IMPRESSION: No acute CT finding. The enhancement pattern of small bowel and colon mucosa is unremarkable, with no CT evidence of ischemic bowel. The colon is partially fluid-filled, which may reflect a nonspecific enteritis/colitis. No CT evidence of diverticulitis. Significant aortic atherosclerosis, with focal high-grade aortic stenosis secondary to mixed calcified and soft plaque, occurring at the level of the inferior mesenteric artery. If there is concern for short distance claudication, correlation with noninvasive lower extremity exam may be useful. Aortic Atherosclerosis (ICD10-I70.0). Atherosclerotic changes at the origin of the mesenteric arteries, however, only the IMA demonstrates evidence of high-grade stenosis, and there appears to be good collateral flow to the IMA distribution secondary to enlarged meandering mesenteric artery/arc of Riolan. It is uncertain on the  CTA as to what degree atherosclerotic changes/stenosis of the common hepatic artery are contributing to flow compromise.  There are 2 right renal arteries and 3 left renal arteries. Atherosclerotic changes involve the origin of all of the renal arteries, with at least 50% stenosis secondary to mixed calcified and soft plaque at the origin of the largest bilateral renal arteries. Nonspecific pelvic floor laxity, with associated cystocele. Diverticular disease without evidence of acute diverticulitis. Signed, Dulcy Fanny. Earleen Newport, DO [facility] Electronically Signed   By: Corrie Mckusick D.O.   On: 04/04/2017 08:51     Assessment & Plan   1. Gastric ulcer with a visible vessel and acute bleed, resolving. Continue PPI. Continue to observe for rebleeding.   2. Lower abdominal pain has resolved. Etiology of pain is not clear. CTA showed aortic atherosclerosis, IMA stenosis however good collateral flow is noted, pelvic floor laxity, cystocele, diverticulosis.   3. ABL anemia. Trend CBC.   4. Hypokalemia persists - mgmt per primary service.     LOS: 2 days   Norberto Sorenson T. Fuller Plan MD 04/04/2017, 4:30 PM

## 2017-04-04 NOTE — Progress Notes (Signed)
PROGRESS NOTE  Kayla Wyatt ELF:[id number] DOB: [birth year] DOA: 04/02/2017 PCP: [name], MD  HPI/Recap of past 24 hours: Kayla Wyatt is a 62 y.o. female past medical history significant for high blood pressure high cholesterol presents with abdominal pain and diarrhea for about 6 days. Recent use of NSAIDs. FOBT positive in the ED hg 6.4 requiring blood transfusion 2U PRBCs. GI consulted. Post EGD this am revealing a ulcerative vessel that was clipped.  Update: Contacted [facility] faculty MD on call who stated that uterine prolapses are not painful and would not be the source of her pain. They will contact the patient to set up an appointment in the outpatient setting.  04/04/17:1/2 bottles + ecoli bacteremia. Patient seen and examined with her brother at her bedside. She states continues to have loose dark stools and abdominal cramping. Completed CTA abdomen. Results available on epic. Denies nausea or vomiting.  Assessment/Plan: Active Problems:   Acute blood loss anemia  Acute blood loss anemia in the setting of acute upper GI bleed -hg 8.1 from 8.4 post 2U prbcs from 6.8 at presentation -Fobt + -EGD done 04/03/17 revealed: A single visible vessel in the setting of a mild ulceration was found in the stomach. Clipped. -CTA abdomen to r/o ischemia>> stenosis of IMA with collaterals -GI following; we appreciate recommendations  E-coli bacteremia -1/2 blood cx positive for e-coli -IV antibiotics switched to IV ceftriaxone 2 gms daily -repeat blood cx x 2 today 04/04/17  Intractable lower abdominal pain in the setting of upper GI bleed  -continue PPI  Hypokalemia -Improving with repletion -K+ 3.1 from 3.3 from 2.6 -repleted as indicated -BMP am  QT prolongation -Mg and Phos normal 04/03/17 -Avoid medication that would worsen QT prolongation  UTI, poa -urine Cultures taken multiple species- recommend recollection -continue IV antibiotics  Hypertension -BP stable -Hold  oral antihypertensive meds to avoid hypotension  Hyperlipidemia Restart statin  Moderate protein calorie malnutrition -albumin 2.1 (04/02/17) -encourage to increase po intake -oral supplements   Code Status: full   Family Communication: family member at her bedside   Disposition Plan: will stay another midnight to continue IV antibiotics in the setting of newly diagnosed e-coli bacteremia, to monitor symptoms in the setting of recent acute GI bleed.  Consultants:  GI  gynecology   Procedures:  EGD 04/03/17  Antimicrobials:  IV zosyn and IV vancomycin  DVT prophylaxis:  SCDs   Objective: Vitals:   04/03/17 1956 04/03/17 2326 04/04/17 0301 04/04/17 0641  BP: (!) 128/51 (!) 127/49 (!) 132/46   Pulse: 80 95 70   Resp: (!) 35 (!) 24 20   Temp: 97.8 F (36.6 C) 98.2 F (36.8 C) 97.8 F (36.6 C)   TempSrc: Oral Oral Oral   SpO2: 98% 97% 97%   Weight:    60.9 kg (134 lb 4.2 oz)  Height:        Intake/Output Summary (Last 24 hours) at 04/04/2017 0732 Last data filed at 04/04/2017 0400 Gross per 24 hour  Intake 3573.33 ml  Output 2 ml  Net 3571.33 ml   Filed Weights   04/03/17 0500 04/03/17 1652 04/04/17 0641  Weight: 60.7 kg (133 lb 13.1 oz) 62.2 kg (137 lb 2 oz) 60.9 kg (134 lb 4.2 oz)    Exam: pateint seen and examined 04/04/17. No changes in her physical exam.   General:  62 yo CF WD WN appears mildly uncomfortable due to lower abdominal pain   Cardiovascular: RRR no rubs or gallops  Respiratory: CTA  no wheezes or rales  Abdomen: Tender to palpation at lower quadrants  Musculoskeletal: non focal   Skin: no noted rash  Psychiatry: Mood appropriate for condition and setting   Data Reviewed: CBC: Recent Labs  Lab 04/02/17 1459 04/02/17 1559 04/02/17 1932 04/03/17 0409  WBC 16.2*  --   --  12.3*  HGB 8.0* 7.5* 6.8* 8.7*  HCT 22.8* 22.0* 20.0* 24.8*  MCV 88.4  --   --  85.8  PLT 249  --   --  517   Basic Metabolic Panel: Recent Labs    Lab 04/02/17 1459 04/02/17 1559 04/02/17 1932 04/03/17 0409  NA 138 138 141 142  K 2.6* 2.6* 2.8* 3.3*  CL 99* 100* 106 111  CO2 17*  --   --  22  GLUCOSE 225* 220* 93 103*  BUN 116* 100* 85* 73*  CREATININE 1.49* 1.30* 0.90 0.74  CALCIUM 8.4*  --   --  7.9*  MG  --   --   --  2.3  PHOS  --   --   --  2.3*   GFR: Estimated Creatinine Clearance: 58.8 mL/min (by C-G formula based on SCr of 0.74 mg/dL). Liver Function Tests: Recent Labs  Lab 04/02/17 1459  AST 106*  ALT 162*  ALKPHOS 118  BILITOT 0.7  PROT 5.2*  ALBUMIN 2.1*   Recent Labs  Lab 04/02/17 1459  LIPASE 17   No results for input(s): AMMONIA in the last 168 hours. Coagulation Profile: Recent Labs  Lab 04/02/17 1459 04/03/17 0409  INR 1.14 1.10   Cardiac Enzymes: No results for input(s): CKTOTAL, CKMB, CKMBINDEX, TROPONINI in the last 168 hours. BNP (last 3 results) No results for input(s): PROBNP in the last 8760 hours. HbA1C: No results for input(s): HGBA1C in the last 72 hours. CBG: No results for input(s): GLUCAP in the last 168 hours. Lipid Profile: No results for input(s): CHOL, HDL, LDLCALC, TRIG, CHOLHDL, LDLDIRECT in the last 72 hours. Thyroid Function Tests: No results for input(s): TSH, T4TOTAL, FREET4, T3FREE, THYROIDAB in the last 72 hours. Anemia Panel: No results for input(s): VITAMINB12, FOLATE, FERRITIN, TIBC, IRON, RETICCTPCT in the last 72 hours. Urine analysis:    Component Value Date/Time   COLORURINE YELLOW 04/02/2017 1705   APPEARANCEUR CLOUDY (A) 04/02/2017 1705   LABSPEC 1.008 04/02/2017 1705   PHURINE 5.0 04/02/2017 1705   GLUCOSEU NEGATIVE 04/02/2017 1705   HGBUR LARGE (A) 04/02/2017 1705   BILIRUBINUR NEGATIVE 04/02/2017 1705   KETONESUR NEGATIVE 04/02/2017 1705   PROTEINUR NEGATIVE 04/02/2017 1705   NITRITE NEGATIVE 04/02/2017 1705   LEUKOCYTESUR LARGE (A) 04/02/2017 1705   Sepsis Labs: @LABRCNTIP (procalcitonin:4,lacticidven:4)  ) Recent Results (from the  past 240 hour(s))  Culture, blood (Routine x 2)     Status: None (Preliminary result)   Collection Time: 04/02/17  3:40 PM  Result Value Ref Range Status   Specimen Description BLOOD RIGHT FOREARM  Final   Special Requests   Final    BOTTLES DRAWN AEROBIC AND ANAEROBIC Blood Culture adequate volume   Culture  Setup Time   Final    GRAM NEGATIVE RODS AEROBIC BOTTLE ONLY CRITICAL RESULT CALLED TO, READ BACK BY AND VERIFIED WITH: JCARNEY,PHARMD @ 2219 04/03/17 BY LHOWARD    Culture GRAM NEGATIVE RODS  Final   Report Status PENDING  Incomplete  Blood Culture ID Panel (Reflexed)     Status: Abnormal   Collection Time: 04/02/17  3:40 PM  Result Value Ref Range Status   Enterococcus  species NOT DETECTED NOT DETECTED Final   Listeria monocytogenes NOT DETECTED NOT DETECTED Final   Staphylococcus species NOT DETECTED NOT DETECTED Final   Staphylococcus aureus NOT DETECTED NOT DETECTED Final   Streptococcus species NOT DETECTED NOT DETECTED Final   Streptococcus agalactiae NOT DETECTED NOT DETECTED Final   Streptococcus pneumoniae NOT DETECTED NOT DETECTED Final   Streptococcus pyogenes NOT DETECTED NOT DETECTED Final   Acinetobacter baumannii NOT DETECTED NOT DETECTED Final   Enterobacteriaceae species DETECTED (A) NOT DETECTED Final    Comment: Enterobacteriaceae represent a large family of gram-negative bacteria, not a single organism. CRITICAL RESULT CALLED TO, READ BACK BY AND VERIFIED WITH: JCARNEY,PHARMD @2219  04/03/17 BY LHOWARD    Enterobacter cloacae complex NOT DETECTED NOT DETECTED Final   Escherichia coli DETECTED (A) NOT DETECTED Final    Comment: CRITICAL RESULT CALLED TO, READ BACK BY AND VERIFIED WITH: JCARNEY,PHARMD @2219  04/03/17 BY LHOWARD    Klebsiella oxytoca NOT DETECTED NOT DETECTED Final   Klebsiella pneumoniae NOT DETECTED NOT DETECTED Final   Proteus species NOT DETECTED NOT DETECTED Final   Serratia marcescens NOT DETECTED NOT DETECTED Final   Carbapenem  resistance NOT DETECTED NOT DETECTED Final   Haemophilus influenzae NOT DETECTED NOT DETECTED Final   Neisseria meningitidis NOT DETECTED NOT DETECTED Final   Pseudomonas aeruginosa NOT DETECTED NOT DETECTED Final   Candida albicans NOT DETECTED NOT DETECTED Final   Candida glabrata NOT DETECTED NOT DETECTED Final   Candida krusei NOT DETECTED NOT DETECTED Final   Candida parapsilosis NOT DETECTED NOT DETECTED Final   Candida tropicalis NOT DETECTED NOT DETECTED Final  Culture, blood (Routine x 2)     Status: None (Preliminary result)   Collection Time: 04/02/17  3:44 PM  Result Value Ref Range Status   Specimen Description BLOOD RIGHT FOREARM  Final   Special Requests   Final    BOTTLES DRAWN AEROBIC AND ANAEROBIC Blood Culture adequate volume   Culture NO GROWTH < 24 HOURS  Final   Report Status PENDING  Incomplete  Urine culture     Status: Abnormal   Collection Time: 04/02/17  5:05 PM  Result Value Ref Range Status   Specimen Description URINE, RANDOM  Final   Special Requests NONE  Final   Culture MULTIPLE SPECIES PRESENT, SUGGEST RECOLLECTION (A)  Final   Report Status 04/03/2017 FINAL  Final  MRSA PCR Screening     Status: None   Collection Time: 04/02/17  8:43 PM  Result Value Ref Range Status   MRSA by PCR NEGATIVE NEGATIVE Final    Comment:        The GeneXpert MRSA Assay (FDA approved for NASAL specimens only), is one component of a comprehensive MRSA colonization surveillance program. It is not intended to diagnose MRSA infection nor to guide or monitor treatment for MRSA infections.       Studies: No results found.  Scheduled Meds: . feeding supplement (ENSURE ENLIVE)  237 mL Oral BID BM  . pantoprazole (PROTONIX) IV  40 mg Intravenous Once  . polyethylene glycol-electrolytes  4,000 mL Oral Once  . sodium chloride flush  3 mL Intravenous Q12H    Continuous Infusions: . sodium chloride 1,000 mL (04/04/17 0400)  . pantoprozole (PROTONIX) infusion 8  mg/hr (04/04/17 0400)  . piperacillin-tazobactam (ZOSYN)  IV 3.375 g (04/04/17 0606)  . vancomycin Stopped (04/04/17 0459)     LOS: 2 days     Kayleen Memos, MD [facility] Pager [phone]  If 7PM-7AM, please  contact night-coverage [email or web address] Password [facility] 04/04/2017, 7:32 AM

## 2017-04-05 ENCOUNTER — Encounter (HOSPITAL_COMMUNITY): Payer: Self-pay | Admitting: Gastroenterology

## 2017-04-05 DIAGNOSIS — R935 Abnormal findings on diagnostic imaging of other abdominal regions, including retroperitoneum: Secondary | ICD-10-CM

## 2017-04-05 DIAGNOSIS — R945 Abnormal results of liver function studies: Secondary | ICD-10-CM

## 2017-04-05 LAB — HEPATIC FUNCTION PANEL
ALT: 70 U/L — ABNORMAL HIGH (ref 14–54)
AST: 47 U/L — ABNORMAL HIGH (ref 15–41)
Albumin: 2.2 g/dL — ABNORMAL LOW (ref 3.5–5.0)
Alkaline Phosphatase: 64 U/L (ref 38–126)
Bilirubin, Direct: <0.1 mg/dL — ABNORMAL LOW (ref 0.1–0.5)
Total Bilirubin: 0.5 mg/dL (ref 0.3–1.2)
Total Protein: 5.2 g/dL — ABNORMAL LOW (ref 6.5–8.1)

## 2017-04-05 LAB — BASIC METABOLIC PANEL
Anion gap: 9 (ref 5–15)
BUN: 12 mg/dL (ref 6–20)
CO2: 21 mmol/L — ABNORMAL LOW (ref 22–32)
Calcium: 7.9 mg/dL — ABNORMAL LOW (ref 8.9–10.3)
Chloride: 115 mmol/L — ABNORMAL HIGH (ref 101–111)
Creatinine, Ser: 0.6 mg/dL (ref 0.44–1.00)
GFR calc Af Amer: >60 mL/min (ref >60)
GFR calc non Af Amer: >60 mL/min (ref >60)
Glucose, Bld: 104 mg/dL — ABNORMAL HIGH (ref 65–99)
Potassium: 4 mmol/L (ref 3.5–5.1)
Sodium: 145 mmol/L (ref 135–145)

## 2017-04-05 LAB — CBC
HCT: 23.8 % — ABNORMAL LOW (ref 36.0–46.0)
Hemoglobin: 7.9 g/dL — ABNORMAL LOW (ref 12.0–15.0)
MCH: 29.8 pg (ref 26.0–34.0)
MCHC: 33.2 g/dL (ref 30.0–36.0)
MCV: 89.8 fL (ref 78.0–100.0)
Platelets: 228 10*3/uL (ref 150–400)
RBC: 2.65 MIL/uL — ABNORMAL LOW (ref 3.87–5.11)
RDW: 15.2 % (ref 11.5–15.5)
WBC: 10.4 10*3/uL (ref 4.0–10.5)

## 2017-04-05 LAB — CULTURE, BLOOD (ROUTINE X 2): Special Requests: ADEQUATE

## 2017-04-05 MED ORDER — PANTOPRAZOLE SODIUM 40 MG PO TBEC
40.0000 mg | DELAYED_RELEASE_TABLET | Freq: Two times a day (BID) | ORAL | Status: DC
  Administered 2017-04-05 – 2017-04-07 (×4): 40 mg via ORAL
  Filled 2017-04-05 (×4): qty 1

## 2017-04-05 MED ORDER — SODIUM CHLORIDE 0.9 % IV SOLN
1000.0000 mL | INTRAVENOUS | Status: DC
  Administered 2017-04-06: 1000 mL via INTRAVENOUS

## 2017-04-05 NOTE — Progress Notes (Signed)
PROGRESS NOTE  Kayla Wyatt UXL:[id number] DOB: December 01, 1955 DOA: 04/02/2017 PCP: [name], MD  HPI/Recap of past 24 hours: Kayla Wyatt is a 62 y.o. female past medical history significant for high blood pressure high cholesterol presents with abdominal pain and diarrhea for about 6 days. Recent use of NSAIDs. FOBT positive in the ED hg 6.4 requiring blood transfusion 2U PRBCs. GI consulted. Post EGD this am revealing a ulcerative vessel that was clipped.  Update: Contacted [facility] faculty MD on call who stated that uterine prolapses are not painful and would not be the source of her pain. They will contact the patient to set up an appointment in the outpatient setting.  04/05/17:1/2 bottles + ecoli bacteremia. On IV ceftriaxone. Drop in Hg could be dilutional. Stopped IV fluid due to good BP and good oral intake. Denies dizziness or chest pain. Abdominal pain is improving.   Assessment/Plan: Active Problems:   Acute blood loss anemia   Gastrointestinal hemorrhage with melena   Lower abdominal pain of unknown etiology   Abnormal computed tomography angiography (CTA) of abdomen and pelvis  Acute blood loss anemia in the setting of gastric ulcer/acute upper GI bleed -hg 7.9 from 8.1 from 8.4 post 2U prbcs from 6.8 at presentation -Fobt + -EGD done 04/03/17 revealed: A single visible vessel in the setting of a mild ulceration was found in the stomach. Clipped. -CTA abdomen to r/o ischemia>> stenosis of IMA with collaterals -GI following; we appreciate recommendations -drop in hg may have been dilutional -stop IV fluid>>good oral intake and good BP  E-coli bacteremia -1/2 blood cx positive for e-coli -continue IV ceftriaxone 2 gms daily -repeat blood cx x 2 04/04/17-no growth in less than 24 hours  Intractable lower abdominal pain in the setting of upper GI bleed, resolving  -continue PPI  Hypokalemia, resolved -K+ 4.0 from 3.1 from 3.3 from 2.6 -repleted as indicated -BMP  am  QT prolongation -Mg and Phos normal 04/03/17 -Avoid medication that would worsen QT prolongation  UTI, poa -urine Cultures taken multiple species- recommend recollection -continue IV antibiotics  Hypertension -BP stable -Hold oral antihypertensive meds to avoid hypotension  Hyperlipidemia Restart statin  Moderate protein calorie malnutrition -albumin 2.1 (04/02/17) -encourage to increase po intake -oral supplements  Acute Transaminitis -improving -LFTs trending down -avoid hepatotoxic agents   Code Status: full   Family Communication: brother at her bedside   Disposition Plan: will stay another midnight to continue IV antibiotics in the setting of newly diagnosed e-coli bacteremia, to monitor symptoms in the setting of recent acute GI bleed.  SEVERITY: Moderate due to advanced age and recent GI bleed complicated by bacteremia.   Consultants:  GI  gynecology   Procedures:  EGD 04/03/17  Antimicrobials:  IV ceftriaxone  DVT prophylaxis:  SCDs   Objective: Vitals:   04/04/17 1930 04/04/17 2327 04/05/17 0346 04/05/17 0523  BP: (!) 129/52 (!) 148/95 (!) 125/102   Pulse: 62 61 70   Resp: 19 (!) 23 20   Temp: 97.7 F (36.5 C) (!) 97.5 F (36.4 C) 97.6 F (36.4 C)   TempSrc: Oral Oral Oral   SpO2: 97% 97% 96%   Weight:    64.6 kg (142 lb 6.7 oz)  Height:        Intake/Output Summary (Last 24 hours) at 04/05/2017 0738 Last data filed at 04/05/2017 0300 Gross per 24 hour  Intake 2070 ml  Output 375 ml  Net 1695 ml   Filed Weights   04/03/17 1652 04/04/17 5366  04/05/17 0523  Weight: 62.2 kg (137 lb 2 oz) 60.9 kg (134 lb 4.2 oz) 64.6 kg (142 lb 6.7 oz)    Exam: pateint seen and examined 04/05/17. No changes in her physical exam.   General:  62 yo CF WD WN NAD A&O x3  Cardiovascular: RRR no rubs or gallops  Respiratory: CTA no wheezes or rales  Abdomen: soft NT ND NBS x4  Musculoskeletal: non focal   Skin: no noted  rash  Psychiatry: Mood appropriate for condition and setting   Data Reviewed: CBC: Recent Labs  Lab 04/02/17 1459 04/02/17 1559 04/02/17 1932 04/03/17 0409 04/04/17 0654 04/05/17 0447  WBC 16.2*  --   --  12.3* 8.8 10.4  HGB 8.0* 7.5* 6.8* 8.7* 8.1* 7.9*  HCT 22.8* 22.0* 20.0* 24.8* 23.5* 23.8*  MCV 88.4  --   --  85.8 87.4 89.8  PLT 249  --   --  200 242 409   Basic Metabolic Panel: Recent Labs  Lab 04/02/17 1459 04/02/17 1559 04/02/17 1932 04/03/17 0409 04/04/17 0654 04/05/17 0447  NA 138 138 141 142 147* 145  K 2.6* 2.6* 2.8* 3.3* 3.1* 4.0  CL 99* 100* 106 111 114* 115*  CO2 17*  --   --  22 24 21*  GLUCOSE 225* 220* 93 103* 108* 104*  BUN 116* 100* 85* 73* 19 12  CREATININE 1.49* 1.30* 0.90 0.74 0.69 0.60  CALCIUM 8.4*  --   --  7.9* 8.2* 7.9*  MG  --   --   --  2.3  --   --   PHOS  --   --   --  2.3*  --   --    GFR: Estimated Creatinine Clearance: 64.3 mL/min (by C-G formula based on SCr of 0.6 mg/dL). Liver Function Tests: Recent Labs  Lab 04/02/17 1459  AST 106*  ALT 162*  ALKPHOS 118  BILITOT 0.7  PROT 5.2*  ALBUMIN 2.1*   Recent Labs  Lab 04/02/17 1459  LIPASE 17   No results for input(s): AMMONIA in the last 168 hours. Coagulation Profile: Recent Labs  Lab 04/02/17 1459 04/03/17 0409  INR 1.14 1.10   Cardiac Enzymes: No results for input(s): CKTOTAL, CKMB, CKMBINDEX, TROPONINI in the last 168 hours. BNP (last 3 results) No results for input(s): PROBNP in the last 8760 hours. HbA1C: No results for input(s): HGBA1C in the last 72 hours. CBG: No results for input(s): GLUCAP in the last 168 hours. Lipid Profile: No results for input(s): CHOL, HDL, LDLCALC, TRIG, CHOLHDL, LDLDIRECT in the last 72 hours. Thyroid Function Tests: No results for input(s): TSH, T4TOTAL, FREET4, T3FREE, THYROIDAB in the last 72 hours. Anemia Panel: No results for input(s): VITAMINB12, FOLATE, FERRITIN, TIBC, IRON, RETICCTPCT in the last 72 hours. Urine  analysis:    Component Value Date/Time   COLORURINE YELLOW 04/02/2017 1705   APPEARANCEUR CLOUDY (A) 04/02/2017 1705   LABSPEC 1.008 04/02/2017 1705   PHURINE 5.0 04/02/2017 1705   GLUCOSEU NEGATIVE 04/02/2017 1705   HGBUR LARGE (A) 04/02/2017 1705   BILIRUBINUR NEGATIVE 04/02/2017 [place] 04/02/2017 1705   PROTEINUR NEGATIVE 04/02/2017 1705   NITRITE NEGATIVE 04/02/2017 1705   LEUKOCYTESUR LARGE (A) 04/02/2017 1705   Sepsis Labs: @LABRCNTIP (procalcitonin:4,lacticidven:4)  ) Recent Results (from the past 240 hour(s))  Culture, blood (Routine x 2)     Status: Abnormal (Preliminary result)   Collection Time: 04/02/17  3:40 PM  Result Value Ref Range Status   Specimen Description  BLOOD RIGHT FOREARM  Final   Special Requests   Final    BOTTLES DRAWN AEROBIC AND ANAEROBIC Blood Culture adequate volume   Culture  Setup Time   Final    GRAM NEGATIVE RODS AEROBIC BOTTLE ONLY CRITICAL RESULT CALLED TO, READ BACK BY AND VERIFIED WITH: JCARNEY,PHARMD @ 2219 04/03/17 BY LHOWARD    Culture ESCHERICHIA COLI SUSCEPTIBILITIES TO FOLLOW  (A)  Final   Report Status PENDING  Incomplete  Blood Culture ID Panel (Reflexed)     Status: Abnormal   Collection Time: 04/02/17  3:40 PM  Result Value Ref Range Status   Enterococcus species NOT DETECTED NOT DETECTED Final   Listeria monocytogenes NOT DETECTED NOT DETECTED Final   Staphylococcus species NOT DETECTED NOT DETECTED Final   Staphylococcus aureus NOT DETECTED NOT DETECTED Final   Streptococcus species NOT DETECTED NOT DETECTED Final   Streptococcus agalactiae NOT DETECTED NOT DETECTED Final   Streptococcus pneumoniae NOT DETECTED NOT DETECTED Final   Streptococcus pyogenes NOT DETECTED NOT DETECTED Final   Acinetobacter baumannii NOT DETECTED NOT DETECTED Final   Enterobacteriaceae species DETECTED (A) NOT DETECTED Final    Comment: Enterobacteriaceae represent a large family of gram-negative bacteria, not a single  organism. CRITICAL RESULT CALLED TO, READ BACK BY AND VERIFIED WITH: JCARNEY,PHARMD @2219  04/03/17 BY LHOWARD    Enterobacter cloacae complex NOT DETECTED NOT DETECTED Final   Escherichia coli DETECTED (A) NOT DETECTED Final    Comment: CRITICAL RESULT CALLED TO, READ BACK BY AND VERIFIED WITH: JCARNEY,PHARMD @2219  04/03/17 BY LHOWARD    Klebsiella oxytoca NOT DETECTED NOT DETECTED Final   Klebsiella pneumoniae NOT DETECTED NOT DETECTED Final   Proteus species NOT DETECTED NOT DETECTED Final   Serratia marcescens NOT DETECTED NOT DETECTED Final   Carbapenem resistance NOT DETECTED NOT DETECTED Final   Haemophilus influenzae NOT DETECTED NOT DETECTED Final   Neisseria meningitidis NOT DETECTED NOT DETECTED Final   Pseudomonas aeruginosa NOT DETECTED NOT DETECTED Final   Candida albicans NOT DETECTED NOT DETECTED Final   Candida glabrata NOT DETECTED NOT DETECTED Final   Candida krusei NOT DETECTED NOT DETECTED Final   Candida parapsilosis NOT DETECTED NOT DETECTED Final   Candida tropicalis NOT DETECTED NOT DETECTED Final  Culture, blood (Routine x 2)     Status: None (Preliminary result)   Collection Time: 04/02/17  3:44 PM  Result Value Ref Range Status   Specimen Description BLOOD RIGHT FOREARM  Final   Special Requests   Final    BOTTLES DRAWN AEROBIC AND ANAEROBIC Blood Culture adequate volume   Culture NO GROWTH 2 DAYS  Final   Report Status PENDING  Incomplete  Urine culture     Status: Abnormal   Collection Time: 04/02/17  5:05 PM  Result Value Ref Range Status   Specimen Description URINE, RANDOM  Final   Special Requests NONE  Final   Culture MULTIPLE SPECIES PRESENT, SUGGEST RECOLLECTION (A)  Final   Report Status 04/03/2017 FINAL  Final  MRSA PCR Screening     Status: None   Collection Time: 04/02/17  8:43 PM  Result Value Ref Range Status   MRSA by PCR NEGATIVE NEGATIVE Final    Comment:        The GeneXpert MRSA Assay (FDA approved for NASAL specimens only),  is one component of a comprehensive MRSA colonization surveillance program. It is not intended to diagnose MRSA infection nor to guide or monitor treatment for MRSA infections.       Studies:  No results found.  Scheduled Meds: . feeding supplement (ENSURE ENLIVE)  237 mL Oral BID BM  . pantoprazole (PROTONIX) IV  40 mg Intravenous Once  . polyethylene glycol-electrolytes  4,000 mL Oral Once  . potassium chloride  40 mEq Oral TID  . simvastatin  20 mg Oral Daily  . sodium chloride flush  3 mL Intravenous Q12H    Continuous Infusions: . sodium chloride 1,000 mL (04/04/17 1200)  . cefTRIAXone (ROCEPHIN)  IV Stopped (04/04/17 1808)  . pantoprozole (PROTONIX) infusion 8 mg/hr (04/04/17 1200)     LOS: 3 days     Kayleen Memos, MD [facility] Pager [phone]  If 7PM-7AM, please contact night-coverage [email or web address] Password [facility] 04/05/2017, 7:38 AM

## 2017-04-05 NOTE — Progress Notes (Signed)
GI Progress Note covering for Drs. Mann & Hung    Subjective  Melena is decreasing. No abdominal pain. Tolerating regular diet.    Objective  Vital signs in last 24 hours: Temp:  [97.5 F (36.4 C)-98 F (36.7 C)] 97.9 F (36.6 C) (01/27 0807) Pulse Rate:  [61-82] 72 (01/27 0807) Resp:  [17-27] 25 (01/27 0807) BP: (125-155)/(44-102) 151/48 (01/27 0807) SpO2:  [96 %-99 %] 99 % (01/27 0807) Weight:  [142 lb 6.7 oz (64.6 kg)] 142 lb 6.7 oz (64.6 kg) (01/27 0523) Last BM Date: 04/04/17  General: Alert, well-developed, in NAD Heart:  Regular rate and rhythm; no murmurs Chest: Clear to ascultation bilaterally Abdomen:  Soft, nontender and nondistended. Normal bowel sounds, without guarding, and without rebound.   Extremities:  Without edema. Neurologic:  Alert and  oriented x4; grossly normal neurologically. Psych:  Alert and cooperative. Normal mood and affect.  Intake/Output from previous day: 01/26 0701 - 01/27 0700 In: 2070 [P.O.:720; I.V.:1200; IV Piggyback:150] Out: 375 [Urine:375] Intake/Output this shift: No intake/output data recorded.  Lab Results: Recent Labs    04/03/17 0409 04/04/17 0654 04/05/17 0447  WBC 12.3* 8.8 10.4  HGB 8.7* 8.1* 7.9*  HCT 24.8* 23.5* 23.8*  PLT 200 242 228   BMET Recent Labs    04/03/17 0409 04/04/17 0654 04/05/17 0447  NA 142 147* 145  K 3.3* 3.1* 4.0  CL 111 114* 115*  CO2 22 24 21*  GLUCOSE 103* 108* 104*  BUN 73* 19 12  CREATININE 0.74 0.69 0.60  CALCIUM 7.9* 8.2* 7.9*   LFT Recent Labs    04/02/17 1459  PROT 5.2*  ALBUMIN 2.1*  AST 106*  ALT 162*  ALKPHOS 118  BILITOT 0.7   PT/INR Recent Labs    04/02/17 1459 04/03/17 0409  LABPROT 14.5 14.1  INR 1.14 1.10    Studies/Results: Ct Angio Abd/pel W/ And/or W/o  Result Date: 04/04/2017 CLINICAL DATA:  62 year old female with a history of ischemia. Possible ischemic bowel. EXAM: CTA ABDOMEN AND PELVIS wITHOUT AND WITH CONTRAST TECHNIQUE: Multidetector  CT imaging of the abdomen and pelvis was performed using the standard protocol during bolus administration of intravenous contrast. Multiplanar reconstructed images and MIPs were obtained and reviewed to evaluate the vascular anatomy. CONTRAST:  <See Chart> ISOVUE-370 IOPAMIDOL (ISOVUE-370) INJECTION 76% COMPARISON:  04/02/2017 FINDINGS: VASCULAR Aorta: Mild atherosclerotic changes of the distal thoracic aorta. Greatest diameter at the aortic hiatus measures 19 mm. Advanced mixed calcified and soft plaque of the infrarenal abdominal aorta high-grade stenosis near the origin of the inferior mesenteric artery secondary to mixed calcified and soft plaque. Small caliber aorta, measuring 13 mm at this site. No periaortic fluid. No aneurysm. Celiac: There is a separate origin of the left gastric artery just above the celiac artery origin. There is approximately 50% stenosis at the origin of the left gastric artery. Mild soft atherosclerotic plaque at the origin of the celiac artery without significant stenosis on coronal reformatted images. Splenic artery demonstrates high-grade stenosis in the proximal third secondary to calcified plaque. Atherosclerotic changes of the common hepatic artery, resulting in stenosis proximal to the GDA origin with poststenotic dilation. SMA: Minimal atherosclerotic changes at the origin of the superior mesenteric artery. A proximal rightward branch of the superior mesenteric artery is enlarged, representative of likely middle colic artery and collateral flow to the inferior mesenteric artery (meandering artery of [place]/arc of [place]iol[place]). Renals: Main right renal artery with significant atherosclerotic changes, likely greater than 50% stenosis at the  origin. There is a small accessory right renal artery just below the main right renal artery. There are 3 left renal arteries, the most superior 2 of which demonstrate approximately 50% stenosis at the origin. A small inferior left renal artery  to the lower pole is patent. IMA: Inferior mesenteric artery demonstrates significant stenosis at the origin though remains patent at the origin. The origin is at the site of greatest stenosis of the abdominal aorta. Right lower extremity: Moderate mixed calcified and soft plaque of the right iliac artery with no aneurysm or dissection. Hypogastric arteries patent. Minimal atherosclerotic changes of the external iliac artery and common femoral artery. Proximal profunda femoris and SFA are patent. Left lower extremity: Moderate mixed calcified and soft plaque of the left common iliac artery without significant stenosis or occlusion. No dissection or aneurysm. Hypogastric arteries patent. Minimal atherosclerotic changes of the external iliac artery and common femoral artery. Proximal left SFA and profunda femoris are patent. Veins: Unremarkable appearance of the venous system. Review of the MIP images confirms the above findings. NON-VASCULAR Lower chest: Unremarkable. Hepatobiliary: Unremarkable appearance of the liver. Unremarkable gall bladder. Pancreas: Unremarkable pancreas Spleen: Unremarkable spleen Adrenals/Urinary Tract: Unremarkable appearance of the right adrenal gland. Nodular contour of the lateral limb of the left adrenal gland, with no focal nodule identified. Right: Symmetric perfusion of the bilateral kidneys. No right-sided hydronephrosis. No nephrolithiasis. Left: Symmetric perfusion of the left kidney. No left-sided hydronephrosis or nephrolithiasis. Unremarkable course of the left ureter. Small urethrocele Stomach/Bowel: Unremarkable stomach. No abnormal distention of small bowel. No transition point. The mucosa of small bowel and colon appears to enhance throughout the length of bowel. No dilation. No evidence of free air or significant free intraperitoneal fluid. Normal appendix. Colonic diverticula are present without associated inflammatory changes. No large stool burden. Fluid filled colon.  Lymphatic: No adenopathy. There are small lymph nodes in the periaortic/preaortic nodal station. Mesenteric: No free fluid or air. No adenopathy. Reproductive: Nonspecific pelvic floor laxity.  Hysterectomy. Other: No hernia. Musculoskeletal: Multilevel degenerative changes of the spine. No displaced fracture. IMPRESSION: No acute CT finding. The enhancement pattern of small bowel and colon mucosa is unremarkable, with no CT evidence of ischemic bowel. The colon is partially fluid-filled, which may reflect a nonspecific enteritis/colitis. No CT evidence of diverticulitis. Significant aortic atherosclerosis, with focal high-grade aortic stenosis secondary to mixed calcified and soft plaque, occurring at the level of the inferior mesenteric artery. If there is concern for short distance claudication, correlation with noninvasive lower extremity exam may be useful. Aortic Atherosclerosis (ICD10-I70.0). Atherosclerotic changes at the origin of the mesenteric arteries, however, only the IMA demonstrates evidence of high-grade stenosis, and there appears to be good collateral flow to the IMA distribution secondary to enlarged meandering mesenteric artery/arc of [place]. It is uncertain on the CTA as to what degree atherosclerotic changes/stenosis of the common hepatic artery are contributing to flow compromise. There are 2 right renal arteries and 3 left renal arteries. Atherosclerotic changes involve the origin of all of the renal arteries, with at least 50% stenosis secondary to mixed calcified and soft plaque at the origin of the largest bilateral renal arteries. Nonspecific pelvic floor laxity, with associated cystocele. Diverticular disease without evidence of acute diverticulitis. Signed, Dulcy Fanny. Earleen Newport, DO [facility] Electronically Signed   By: Corrie Mckusick D.O.   On: 04/04/2017 08:51      Assessment & Plan   1. Gastric ulcer with visible vessel and  acute  bleed, resolving. Continue PPI. Hb drifted to 7.9. Trend CBC and observe today.   2. Lower abdominal pain has resolved. Etiology not clear.    3. ABL anemia. Trend CBC.   4. Elevated LFTs -tranaminases. Recheck today.   Drs. Mann & [name] to resume GI care tomorrow.     LOS: 3 days   Norberto Sorenson T. Fuller Plan MD 04/05/2017, 11:55 AM

## 2017-04-05 NOTE — Progress Notes (Signed)
HGB 7.9 was reported to [facility] on call this morning.

## 2017-04-06 ENCOUNTER — Inpatient Hospital Stay (HOSPITAL_COMMUNITY): Payer: BLUE CROSS/BLUE SHIELD

## 2017-04-06 ENCOUNTER — Other Ambulatory Visit (HOSPITAL_COMMUNITY): Payer: BLUE CROSS/BLUE SHIELD

## 2017-04-06 DIAGNOSIS — I340 Nonrheumatic mitral (valve) insufficiency: Secondary | ICD-10-CM

## 2017-04-06 LAB — BPAM RBC
Blood Product Expiration Date: 201901302359
Blood Product Expiration Date: 201901312359
Blood Product Expiration Date: 201902092359
Blood Product Expiration Date: 201902102359
ISSUE DATE / TIME: 201901241557
ISSUE DATE / TIME: 201901242300
ISSUE DATE / TIME: 201901262022
Unit Type and Rh: 8400
Unit Type and Rh: 8400
Unit Type and Rh: 9500
Unit Type and Rh: 9500

## 2017-04-06 LAB — CBC
HCT: 24.3 % — ABNORMAL LOW (ref 36.0–46.0)
Hemoglobin: 8.1 g/dL — ABNORMAL LOW (ref 12.0–15.0)
MCH: 30.3 pg (ref 26.0–34.0)
MCHC: 33.3 g/dL (ref 30.0–36.0)
MCV: 91 fL (ref 78.0–100.0)
Platelets: 262 10*3/uL (ref 150–400)
RBC: 2.67 MIL/uL — ABNORMAL LOW (ref 3.87–5.11)
RDW: 15.2 % (ref 11.5–15.5)
WBC: 13.7 10*3/uL — ABNORMAL HIGH (ref 4.0–10.5)

## 2017-04-06 LAB — TYPE AND SCREEN
ABO/RH(D): AB POS
Antibody Screen: NEGATIVE
Unit division: 0
Unit division: 0
Unit division: 0
Unit division: 0

## 2017-04-06 LAB — URINE CULTURE: Culture: NO GROWTH

## 2017-04-06 LAB — ECHOCARDIOGRAM COMPLETE
Height: 63 in
Weight: 2321 oz

## 2017-04-06 LAB — PROCALCITONIN: Procalcitonin: 0.13 ng/mL

## 2017-04-06 MED ORDER — METOPROLOL TARTRATE 5 MG/5ML IV SOLN
5.0000 mg | INTRAVENOUS | Status: AC | PRN
  Administered 2017-04-06 (×2): 5 mg via INTRAVENOUS
  Filled 2017-04-06 (×2): qty 5

## 2017-04-06 NOTE — Progress Notes (Signed)
Pt converted to new onset of afib w/ rvr. 12 lead preformed and MD notified. Awaiting new orders. Will continue to monitor.

## 2017-04-06 NOTE — Progress Notes (Signed)
  Echocardiogram 2D Echocardiogram has been performed.  Rin Gorton L Androw 04/06/2017, 4:28 PM

## 2017-04-06 NOTE — Progress Notes (Signed)
UNASSIGNED PATIENT  Subjective: Patient denies having any active GI issues at this time. She's had BMs without any evidence of hematochezia melena. She denies having any nausea vomiting she's tolerating her meals well.  Objective: Vital signs in last 24 hours: Temp:  [97.7 F (36.5 C)-98.1 F (36.7 C)] 97.9 F (36.6 C) (01/28 0738) Pulse Rate:  [74-130] 99 (01/28 0357) Resp:  [20-28] 27 (01/28 0357) BP: (134-151)/(42-81) 145/81 (01/28 0357) SpO2:  [95 %-98 %] 96 % (01/28 0357) Weight:  [65.8 kg (145 lb 1 oz)] 65.8 kg (145 lb 1 oz) (01/28 0357) Last BM Date: 04/05/17  Intake/Output from previous day: 01/27 0701 - 01/28 0700 In: 3670.4 [P.O.:720; I.V.:2900.4; IV Piggyback:50] Out: 500 [Urine:500] Intake/Output this shift: Total I/O In: 243 [P.O.:240; I.V.:3] Out: -   General appearance: alert, cooperative, appears stated age and no distress Resp: clear to auscultation bilaterally Cardio: regular rate and rhythm, S1, S2 normal, no murmur, click, rub or gallop GI: soft, non-tender; bowel sounds normal; no masses,  no organomegaly  Lab Results: Recent Labs    04/04/17 0654 04/05/17 0447 04/06/17 0211  WBC 8.8 10.4 13.7*  HGB 8.1* 7.9* 8.1*  HCT 23.5* 23.8* 24.3*  PLT 242 228 262   BMET Recent Labs    04/04/17 0654 04/05/17 0447  NA 147* 145  K 3.1* 4.0  CL 114* 115*  CO2 24 21*  GLUCOSE 108* 104*  BUN 19 12  CREATININE 0.69 0.60  CALCIUM 8.2* 7.9*   LFT Recent Labs    04/05/17 1504  PROT 5.2*  ALBUMIN 2.2*  AST 47*  ALT 70*  ALKPHOS 64  BILITOT 0.5  BILIDIR <0.1*  IBILI NOT CALCULATED   Medications: I have reviewed the patient's current medications.  Assessment/Plan: Gastric ulcer with a visible vessel status post acute GI bleed stable after clipping of the visible vessel was done on Friday 04/03/17-ABLA. Patient's doing well on PPIs; her hemoglobin has been stable; slight drop that was noted earlier may have been dilutional. Continue PPIs. Patient  strongly encouraged to avoid all nonsteroidals. She will need outpatient monitoring of her CBCs and iron supplementation.   LOS: 4 days   Juliya Magill 04/06/2017, 9:51 AM

## 2017-04-06 NOTE — Evaluation (Signed)
Physical Therapy Evaluation Patient Details Name: Kayla Wyatt MRN: [id number] DOB: 10-Jul-1955 Today's Date: 04/06/2017   History of Present Illness  Pt presented with abdominal pain and diarrhea and found to have anemia due to acute GI bleed. PMH - HTN  Clinical Impression  Pt presents to PT with slight decr in mobility due to soreness in RLE (pt reports BP cuff made sore). Pt should be able to return home from PT standpoint whenever she is medically ready. Will follow pt while she remains hospitalized but do not feel she will need follow up PT after DC. Of note pt amb on RA and SpO2 >90%.    Follow Up Recommendations No PT follow up    Equipment Recommendations  None recommended by PT    Recommendations for Other Services       Precautions / Restrictions Precautions Precautions: None Restrictions Weight Bearing Restrictions: No      Mobility  Bed Mobility Overal bed mobility: Modified Independent                Transfers Overall transfer level: Modified independent                  Ambulation/Gait Ambulation/Gait assistance: Supervision Ambulation Distance (Feet): 200 Feet Assistive device: None;Rolling walker (2 wheeled) Gait Pattern/deviations: Step-through pattern;Decreased stance time - right;Decreased stride length;Antalgic Gait velocity: decr Gait velocity interpretation: Below normal speed for age/gender General Gait Details: Slightly guarded, antalgic gait due to RLE soreness. Used walker to decrease discomfort of RLE. Amb on RA with SpO2 > 90%.  Stairs            Wheelchair Mobility    Modified Rankin (Stroke Patients Only)       Balance Overall balance assessment: No apparent balance deficits (not formally assessed)                                           Pertinent Vitals/Pain Pain Assessment: Faces Faces Pain Scale: Hurts little more Pain Location: rt leg Pain Descriptors / Indicators:  Sore;Grimacing Pain Intervention(s): Limited activity within patient's tolerance;Monitored during session    [place] expects to be discharged to:: Private residence Living Arrangements: Other relatives(brother) Available Help at Discharge: Family;Available PRN/intermittently Type of Home: House Home Access: Level entry     Home Layout: One level(except 1 step into some areas) Home Equipment: None      Prior Function Level of Independence: Independent         Comments: works     Journalist, newspaper        Extremity/Trunk Assessment   Upper Extremity Assessment Upper Extremity Assessment: Overall WFL for tasks assessed    Lower Extremity Assessment Lower Extremity Assessment: RLE deficits/detail RLE Deficits / Details: Slightly limited by soreness       Communication   Communication: No difficulties  Cognition Arousal/Alertness: Awake/alert Behavior During Therapy: WFL for tasks assessed/performed Overall Cognitive Status: Within Functional Limits for tasks assessed                                        General Comments      Exercises     Assessment/Plan    PT Assessment Patient needs continued PT services  PT Problem List Decreased mobility;Pain       PT  Treatment Interventions DME instruction;Gait training;Functional mobility training;Therapeutic activities;Therapeutic exercise;Patient/family education    PT Goals (Current goals can be found in the Care Plan section)  Acute Rehab PT Goals Patient Stated Goal: return home PT Goal Formulation: With patient Time For Goal Achievement: 04/10/17 Potential to Achieve Goals: Good    Frequency Min 3X/week   Barriers to discharge        Co-evaluation               AM-PAC PT "6 Clicks" Daily Activity  Outcome Measure Difficulty turning over in bed (including adjusting bedclothes, sheets and blankets)?: None Difficulty moving from lying on back to sitting on the  side of the bed? : None Difficulty sitting down on and standing up from a chair with arms (e.g., wheelchair, bedside commode, etc,.)?: None Help needed moving to and from a bed to chair (including a wheelchair)?: None Help needed walking in hospital room?: A Little Help needed climbing 3-5 steps with a railing? : A Little 6 Click Score: 22    End of Session Equipment Utilized During Treatment: Gait belt Activity Tolerance: Patient tolerated treatment well Patient left: in bed;with call bell/phone within reach Nurse Communication: Mobility status PT Visit Diagnosis: Other abnormalities of gait and mobility (R26.89);Pain Pain - Right/Left: Right Pain - part of body: Leg    Time: 1040-1054 PT Time Calculation (min) (ACUTE ONLY): 14 min   Charges:   PT Evaluation $PT Eval Low Complexity: 1 Low     PT G CodesMarland Kitchen        [facility] [place] 04/06/2017, 11:05 AM

## 2017-04-06 NOTE — Progress Notes (Signed)
PROGRESS NOTE  Kayla Wyatt PTW:[id number] DOB: November 23, 1955 DOA: 04/02/2017 PCP: [name], MD  HPI/Recap of past 24 hours: Kayla Wyatt is a 62 y.o. female past medical history significant for high blood pressure high cholesterol presents with abdominal pain and diarrhea for about 6 days. Recent use of NSAIDs. FOBT positive in the ED hg 6.4 requiring blood transfusion 2U PRBCs. GI consulted. Post EGD this am revealing a ulcerative vessel that was clipped.  Update: Contacted The faculty MD on call who stated that uterine prolapses are not painful and would not be the source of her pain. They will contact the patient to set up an appointment in the outpatient setting.  04/06/17: new A-fib rvr reported overnight. 12 lead EKG repeated this am. 2D echo. CHADSVASC  Score 2, moderate high risk. Recent acute GI bleed. Continue telemetry. Will call cardiology to evaluate after results of echo.  Pt seen and examined at her beside. She has no new complaints.  Assessment/Plan: Active Problems:   Acute blood loss anemia   Gastrointestinal hemorrhage with melena   Lower abdominal pain of unknown etiology   Abnormal computed tomography angiography (CTA) of abdomen and pelvis  Acute blood loss anemia in the setting of gastric ulcer/acute upper GI bleed -hg 7.9 from 8.1 from 8.4 post 2U prbcs from 6.8 at presentation -Fobt + -EGD done 04/03/17 revealed: A single visible vessel in the setting of a mild ulceration was found in the stomach. Clipped. -CTA abdomen to r/o ischemia>> stenosis of IMA with collaterals -GI following; we appreciate recommendations -drop in hg may have been dilutional -stop IV fluid>>good oral intake and good BP  E-coli bacteremia -1/2 blood cx positive for e-coli -continue IV ceftriaxone 2 gms daily -repeat blood cx x 2 04/04/17-no growth to date  New onset A-fib with RVR -2D echo ordered -No anticoagulation due to recent acute GI bleed -continue telemetry -will  call cardiology to evaluate after echo is done -back to normal sinus rythm  Intractable lower abdominal pain in the setting of upper GI bleed, resolving  -continue PPI  Hypokalemia, resolved -K+ 4.0 from 3.1 from 3.3 from 2.6 -repleted as indicated -BMP am  QT prolongation -Mg and Phos normal 04/03/17 -Avoid medication that would worsen QT prolongation  UTI, poa -urine Cultures taken multiple species- recommend recollection -continue IV antibiotics  Hypertension -BP stable -Hold oral antihypertensive meds to avoid hypotension  Hyperlipidemia Restart statin  Moderate protein calorie malnutrition -albumin 2.1 (04/02/17) -encourage to increase po intake -oral supplements  Acute Transaminitis -improving -LFTs trending down -avoid hepatotoxic agents   Code Status: full   Family Communication: brother at her bedside   Disposition Plan: will stay another midnight to continue IV antibiotics in the setting of newly diagnosed e-coli bacteremia, new a-fib rvr in the setting of  recent acute GI bleed.  SEVERITY: Severe due to advanced age, recent GI bleed complicated by bacteremia, new a-fib rvr.   Consultants:  GI  Gynecology  Cardiology (will be called this am)   Procedures:  EGD 04/03/17  Antimicrobials:  IV ceftriaxone  DVT prophylaxis:  SCDs   Objective: Vitals:   04/05/17 1925 04/05/17 2315 04/06/17 0044 04/06/17 0357  BP: (!) 144/42 (!) 148/57 134/61 (!) 145/81  Pulse: 78 94 (!) 130 99  Resp: (!) 22 20 (!) 24 (!) 27  Temp: 98.1 F (36.7 C) 97.9 F (36.6 C)  98.1 F (36.7 C)  TempSrc: Oral Oral  Oral  SpO2: 97% 95% 95% 96%  Weight:  65.8 kg (145 lb 1 oz)  Height:        Intake/Output Summary (Last 24 hours) at 04/06/2017 0747 Last data filed at 04/06/2017 0700 Gross per 24 hour  Intake 3670.42 ml  Output 500 ml  Net 3170.42 ml   Filed Weights   04/04/17 0641 04/05/17 0523 04/06/17 0357  Weight: 60.9 kg (134 lb 4.2 oz) 64.6 kg (142  lb 6.7 oz) 65.8 kg (145 lb 1 oz)    Exam: pateint seen and examined 04/06/17. No changes in her physical exam.   General:  62 yo CF WD WN NAD A&O x3  Cardiovascular: RRR no rubs or gallops  Respiratory: CTA no wheezes or rales  Abdomen: soft NT ND NBS x4  Musculoskeletal: non focal   Skin: no noted rash  Psychiatry: Mood appropriate for condition and setting   Data Reviewed: CBC: Recent Labs  Lab 04/02/17 1459  04/02/17 1932 04/03/17 0409 04/04/17 0654 04/05/17 0447 04/06/17 0211  WBC 16.2*  --   --  12.3* 8.8 10.4 13.7*  HGB 8.0*   < > 6.8* 8.7* 8.1* 7.9* 8.1*  HCT 22.8*   < > 20.0* 24.8* 23.5* 23.8* 24.3*  MCV 88.4  --   --  85.8 87.4 89.8 91.0  PLT 249  --   --  200 242 228 262   < > = values in this interval not displayed.   Basic Metabolic Panel: Recent Labs  Lab 04/02/17 1459 04/02/17 1559 04/02/17 1932 04/03/17 0409 04/04/17 0654 04/05/17 0447  NA 138 138 141 142 147* 145  K 2.6* 2.6* 2.8* 3.3* 3.1* 4.0  CL 99* 100* 106 111 114* 115*  CO2 17*  --   --  22 24 21*  GLUCOSE 225* 220* 93 103* 108* 104*  BUN 116* 100* 85* 73* 19 12  CREATININE 1.49* 1.30* 0.90 0.74 0.69 0.60  CALCIUM 8.4*  --   --  7.9* 8.2* 7.9*  MG  --   --   --  2.3  --   --   PHOS  --   --   --  2.3*  --   --    GFR: Estimated Creatinine Clearance: 64.8 mL/min (by C-G formula based on SCr of 0.6 mg/dL). Liver Function Tests: Recent Labs  Lab 04/02/17 1459 04/05/17 1504  AST 106* 47*  ALT 162* 70*  ALKPHOS 118 64  BILITOT 0.7 0.5  PROT 5.2* 5.2*  ALBUMIN 2.1* 2.2*   Recent Labs  Lab 04/02/17 1459  LIPASE 17   No results for input(s): AMMONIA in the last 168 hours. Coagulation Profile: Recent Labs  Lab 04/02/17 1459 04/03/17 0409  INR 1.14 1.10   Cardiac Enzymes: No results for input(s): CKTOTAL, CKMB, CKMBINDEX, TROPONINI in the last 168 hours. BNP (last 3 results) No results for input(s): PROBNP in the last 8760 hours. HbA1C: No results for input(s):  HGBA1C in the last 72 hours. CBG: No results for input(s): GLUCAP in the last 168 hours. Lipid Profile: No results for input(s): CHOL, HDL, LDLCALC, TRIG, CHOLHDL, LDLDIRECT in the last 72 hours. Thyroid Function Tests: No results for input(s): TSH, T4TOTAL, FREET4, T3FREE, THYROIDAB in the last 72 hours. Anemia Panel: No results for input(s): VITAMINB12, FOLATE, FERRITIN, TIBC, IRON, RETICCTPCT in the last 72 hours. Urine analysis:    Component Value Date/Time   COLORURINE YELLOW 04/02/2017 1705   APPEARANCEUR CLOUDY (A) 04/02/2017 1705   LABSPEC 1.008 04/02/2017 1705   PHURINE 5.0 04/02/2017 1705   GLUCOSEU NEGATIVE  04/02/2017 1705   HGBUR LARGE (A) 04/02/2017 [place] 04/02/2017 [place] 04/02/2017 1705   PROTEINUR NEGATIVE 04/02/2017 1705   NITRITE NEGATIVE 04/02/2017 1705   LEUKOCYTESUR LARGE (A) 04/02/2017 1705   Sepsis Labs: @LABRCNTIP (procalcitonin:4,lacticidven:4)  ) Recent Results (from the past 240 hour(s))  Culture, blood (Routine x 2)     Status: Abnormal   Collection Time: 04/02/17  3:40 PM  Result Value Ref Range Status   Specimen Description BLOOD RIGHT FOREARM  Final   Special Requests   Final    BOTTLES DRAWN AEROBIC AND ANAEROBIC Blood Culture adequate volume   Culture  Setup Time   Final    GRAM NEGATIVE RODS AEROBIC BOTTLE ONLY CRITICAL RESULT CALLED TO, READ BACK BY AND VERIFIED WITH: JCARNEY,PHARMD @ 2219 04/03/17 BY LHOWARD    Culture ESCHERICHIA COLI (A)  Final   Report Status 04/05/2017 FINAL  Final   Organism ID, Bacteria ESCHERICHIA COLI  Final      Susceptibility   Escherichia coli - MIC*    AMPICILLIN <=2 SENSITIVE Sensitive     CEFAZOLIN <=4 SENSITIVE Sensitive     CEFEPIME <=1 SENSITIVE Sensitive     CEFTAZIDIME <=1 SENSITIVE Sensitive     CEFTRIAXONE <=1 SENSITIVE Sensitive     CIPROFLOXACIN <=0.25 SENSITIVE Sensitive     GENTAMICIN <=1 SENSITIVE Sensitive     IMIPENEM <=0.25 SENSITIVE Sensitive       TRIMETH/SULFA <=20 SENSITIVE Sensitive     AMPICILLIN/SULBACTAM <=2 SENSITIVE Sensitive     PIP/TAZO <=4 SENSITIVE Sensitive     Extended ESBL NEGATIVE Sensitive     * ESCHERICHIA COLI  Blood Culture ID Panel (Reflexed)     Status: Abnormal   Collection Time: 04/02/17  3:40 PM  Result Value Ref Range Status   Enterococcus species NOT DETECTED NOT DETECTED Final   Listeria monocytogenes NOT DETECTED NOT DETECTED Final   Staphylococcus species NOT DETECTED NOT DETECTED Final   Staphylococcus aureus NOT DETECTED NOT DETECTED Final   Streptococcus species NOT DETECTED NOT DETECTED Final   Streptococcus agalactiae NOT DETECTED NOT DETECTED Final   Streptococcus pneumoniae NOT DETECTED NOT DETECTED Final   Streptococcus pyogenes NOT DETECTED NOT DETECTED Final   Acinetobacter baumannii NOT DETECTED NOT DETECTED Final   Enterobacteriaceae species DETECTED (A) NOT DETECTED Final    Comment: Enterobacteriaceae represent a large family of gram-negative bacteria, not a single organism. CRITICAL RESULT CALLED TO, READ BACK BY AND VERIFIED WITH: JCARNEY,PHARMD @2219  04/03/17 BY LHOWARD    Enterobacter cloacae complex NOT DETECTED NOT DETECTED Final   Escherichia coli DETECTED (A) NOT DETECTED Final    Comment: CRITICAL RESULT CALLED TO, READ BACK BY AND VERIFIED WITH: JCARNEY,PHARMD @2219  04/03/17 BY LHOWARD    Klebsiella oxytoca NOT DETECTED NOT DETECTED Final   Klebsiella pneumoniae NOT DETECTED NOT DETECTED Final   Proteus species NOT DETECTED NOT DETECTED Final   Serratia marcescens NOT DETECTED NOT DETECTED Final   Carbapenem resistance NOT DETECTED NOT DETECTED Final   Haemophilus influenzae NOT DETECTED NOT DETECTED Final   Neisseria meningitidis NOT DETECTED NOT DETECTED Final   Pseudomonas aeruginosa NOT DETECTED NOT DETECTED Final   Candida albicans NOT DETECTED NOT DETECTED Final   Candida glabrata NOT DETECTED NOT DETECTED Final   Candida krusei NOT DETECTED NOT DETECTED Final    Candida parapsilosis NOT DETECTED NOT DETECTED Final   Candida tropicalis NOT DETECTED NOT DETECTED Final  Culture, blood (Routine x 2)     Status:  None (Preliminary result)   Collection Time: 04/02/17  3:44 PM  Result Value Ref Range Status   Specimen Description BLOOD RIGHT FOREARM  Final   Special Requests   Final    BOTTLES DRAWN AEROBIC AND ANAEROBIC Blood Culture adequate volume   Culture NO GROWTH 3 DAYS  Final   Report Status PENDING  Incomplete  Urine culture     Status: Abnormal   Collection Time: 04/02/17  5:05 PM  Result Value Ref Range Status   Specimen Description URINE, RANDOM  Final   Special Requests NONE  Final   Culture MULTIPLE SPECIES PRESENT, SUGGEST RECOLLECTION (A)  Final   Report Status 04/03/2017 FINAL  Final  MRSA PCR Screening     Status: None   Collection Time: 04/02/17  8:43 PM  Result Value Ref Range Status   MRSA by PCR NEGATIVE NEGATIVE Final    Comment:        The GeneXpert MRSA Assay (FDA approved for NASAL specimens only), is one component of a comprehensive MRSA colonization surveillance program. It is not intended to diagnose MRSA infection nor to guide or monitor treatment for MRSA infections.   Culture, blood (routine x 2)     Status: None (Preliminary result)   Collection Time: 04/04/17  3:16 PM  Result Value Ref Range Status   Specimen Description BLOOD LEFT ANTECUBITAL  Final   Special Requests IN PEDIATRIC BOTTLE Blood Culture adequate volume  Final   Culture NO GROWTH < 24 HOURS  Final   Report Status PENDING  Incomplete  Culture, blood (routine x 2)     Status: None (Preliminary result)   Collection Time: 04/04/17  3:19 PM  Result Value Ref Range Status   Specimen Description BLOOD LEFT HAND  Final   Special Requests IN PEDIATRIC BOTTLE Blood Culture adequate volume  Final   Culture NO GROWTH < 24 HOURS  Final   Report Status PENDING  Incomplete      Studies: No results found.  Scheduled Meds: . feeding supplement  (ENSURE ENLIVE)  237 mL Oral BID BM  . pantoprazole  40 mg Oral BID  . pantoprazole (PROTONIX) IV  40 mg Intravenous Once  . polyethylene glycol-electrolytes  4,000 mL Oral Once  . simvastatin  20 mg Oral Daily  . sodium chloride flush  3 mL Intravenous Q12H    Continuous Infusions: . sodium chloride 1,000 mL (04/05/17 1600)  . cefTRIAXone (ROCEPHIN)  IV 2 g (04/05/17 1454)     LOS: 4 days     Kayleen Memos, MD [facility] Pager [phone]  If 7PM-7AM, please contact night-coverage [email or web address] Password [facility] 04/06/2017, 7:47 AM

## 2017-04-07 ENCOUNTER — Inpatient Hospital Stay (HOSPITAL_COMMUNITY): Payer: BLUE CROSS/BLUE SHIELD

## 2017-04-07 ENCOUNTER — Encounter (HOSPITAL_COMMUNITY): Payer: Self-pay | Admitting: Physician Assistant

## 2017-04-07 DIAGNOSIS — I82431 Acute embolism and thrombosis of right popliteal vein: Secondary | ICD-10-CM

## 2017-04-07 DIAGNOSIS — I480 Paroxysmal atrial fibrillation: Secondary | ICD-10-CM

## 2017-04-07 DIAGNOSIS — M7989 Other specified soft tissue disorders: Secondary | ICD-10-CM

## 2017-04-07 DIAGNOSIS — I4819 Other persistent atrial fibrillation: Secondary | ICD-10-CM

## 2017-04-07 DIAGNOSIS — K922 Gastrointestinal hemorrhage, unspecified: Secondary | ICD-10-CM

## 2017-04-07 HISTORY — DX: Other persistent atrial fibrillation: I48.19

## 2017-04-07 LAB — COMPREHENSIVE METABOLIC PANEL
ALT: 40 U/L (ref 14–54)
AST: 24 U/L (ref 15–41)
Albumin: 1.9 g/dL — ABNORMAL LOW (ref 3.5–5.0)
Alkaline Phosphatase: 51 U/L (ref 38–126)
Anion gap: 7 (ref 5–15)
BUN: 17 mg/dL (ref 6–20)
CO2: 24 mmol/L (ref 22–32)
Calcium: 7.7 mg/dL — ABNORMAL LOW (ref 8.9–10.3)
Chloride: 113 mmol/L — ABNORMAL HIGH (ref 101–111)
Creatinine, Ser: 0.51 mg/dL (ref 0.44–1.00)
GFR calc Af Amer: >60 mL/min (ref >60)
GFR calc non Af Amer: >60 mL/min (ref >60)
Glucose, Bld: 109 mg/dL — ABNORMAL HIGH (ref 65–99)
Potassium: 3.3 mmol/L — ABNORMAL LOW (ref 3.5–5.1)
Sodium: 144 mmol/L (ref 135–145)
Total Bilirubin: 0.3 mg/dL (ref 0.3–1.2)
Total Protein: 4.4 g/dL — ABNORMAL LOW (ref 6.5–8.1)

## 2017-04-07 LAB — CBC
HCT: 17.8 % — ABNORMAL LOW (ref 36.0–46.0)
Hemoglobin: 6 g/dL — CL (ref 12.0–15.0)
MCH: 31.1 pg (ref 26.0–34.0)
MCHC: 33.7 g/dL (ref 30.0–36.0)
MCV: 92.2 fL (ref 78.0–100.0)
Platelets: 238 10*3/uL (ref 150–400)
RBC: 1.93 MIL/uL — ABNORMAL LOW (ref 3.87–5.11)
RDW: 15.5 % (ref 11.5–15.5)
WBC: 18.5 10*3/uL — ABNORMAL HIGH (ref 4.0–10.5)

## 2017-04-07 LAB — CULTURE, BLOOD (ROUTINE X 2)
Culture: NO GROWTH
Special Requests: ADEQUATE

## 2017-04-07 LAB — PREPARE RBC (CROSSMATCH)

## 2017-04-07 MED ORDER — AMIODARONE HCL IN DEXTROSE 360-4.14 MG/200ML-% IV SOLN
60.0000 mg/h | INTRAVENOUS | Status: AC
  Administered 2017-04-07: 60 mg/h via INTRAVENOUS
  Filled 2017-04-07: qty 200

## 2017-04-07 MED ORDER — PANTOPRAZOLE SODIUM 40 MG IV SOLR
40.0000 mg | Freq: Two times a day (BID) | INTRAVENOUS | Status: DC
Start: 2017-04-11 — End: 2017-04-16
  Administered 2017-04-11 – 2017-04-16 (×11): 40 mg via INTRAVENOUS
  Filled 2017-04-07 (×14): qty 40

## 2017-04-07 MED ORDER — AMIODARONE LOAD VIA INFUSION
150.0000 mg | Freq: Once | INTRAVENOUS | Status: AC
  Administered 2017-04-07: 150 mg via INTRAVENOUS

## 2017-04-07 MED ORDER — DILTIAZEM HCL 50 MG/10ML IV SOLN: 0.0500 mg/kg/h | INTRAVENOUS | Status: DC

## 2017-04-07 MED ORDER — SODIUM CHLORIDE 0.9 % IV SOLN
8.0000 mg/h | INTRAVENOUS | Status: DC
  Administered 2017-04-07 – 2017-04-10 (×6): 8 mg/h via INTRAVENOUS
  Filled 2017-04-07 (×8): qty 80

## 2017-04-07 MED ORDER — AMIODARONE HCL IN DEXTROSE 360-4.14 MG/200ML-% IV SOLN
INTRAVENOUS | Status: AC
  Administered 2017-04-07: 150 mg via INTRAVENOUS
  Filled 2017-04-07: qty 200

## 2017-04-07 MED ORDER — METOPROLOL TARTRATE 5 MG/5ML IV SOLN
5.0000 mg | Freq: Once | INTRAVENOUS | Status: AC
  Administered 2017-04-07: 5 mg via INTRAVENOUS

## 2017-04-07 MED ORDER — AMIODARONE HCL IN DEXTROSE 360-4.14 MG/200ML-% IV SOLN
30.0000 mg/h | INTRAVENOUS | Status: DC
  Administered 2017-04-07 – 2017-04-08 (×3): 30 mg/h via INTRAVENOUS
  Filled 2017-04-07 (×3): qty 200

## 2017-04-07 MED ORDER — METOPROLOL TARTRATE 5 MG/5ML IV SOLN
INTRAVENOUS | Status: AC
  Administered 2017-04-07: 5 mg via INTRAVENOUS
  Filled 2017-04-07: qty 5

## 2017-04-07 MED ORDER — METOPROLOL TARTRATE 5 MG/5ML IV SOLN
INTRAVENOUS | Status: AC
  Administered 2017-04-07: 2.5 mg via INTRAVENOUS
  Filled 2017-04-07: qty 5

## 2017-04-07 MED ORDER — PANTOPRAZOLE SODIUM 40 MG IV SOLR
80.0000 mg | Freq: Once | INTRAVENOUS | Status: AC
  Administered 2017-04-07: 80 mg via INTRAVENOUS
  Filled 2017-04-07: qty 80

## 2017-04-07 MED ORDER — IOPAMIDOL (ISOVUE-370) INJECTION 76%
INTRAVENOUS | Status: AC
Start: 2017-04-07 — End: 2017-04-08
  Filled 2017-04-07: qty 100

## 2017-04-07 MED ORDER — METOPROLOL TARTRATE 5 MG/5ML IV SOLN
2.5000 mg | Freq: Once | INTRAVENOUS | Status: AC
  Administered 2017-04-07: 2.5 mg via INTRAVENOUS

## 2017-04-07 MED ORDER — TECHNETIUM TC 99M-LABELED RED BLOOD CELLS IV KIT
25.0000 | PACK | Freq: Once | INTRAVENOUS | Status: AC | PRN
  Administered 2017-04-07: 25 via INTRAVENOUS

## 2017-04-07 MED ORDER — SODIUM CHLORIDE 0.9 % IV SOLN
Freq: Once | INTRAVENOUS | Status: AC
  Administered 2017-04-07: 14:00:00 via INTRAVENOUS

## 2017-04-07 MED ORDER — DILTIAZEM HCL-DEXTROSE 100-5 MG/100ML-% IV SOLN (PREMIX)
5.0000 mg/h | INTRAVENOUS | Status: DC
  Administered 2017-04-07: 5 mg/h via INTRAVENOUS
  Filled 2017-04-07: qty 100

## 2017-04-07 MED ORDER — IOPAMIDOL (ISOVUE-370) INJECTION 76%
INTRAVENOUS | Status: AC
  Administered 2017-04-07: 100 mL
  Filled 2017-04-07: qty 100

## 2017-04-07 MED ORDER — SODIUM CHLORIDE 0.9 % IV BOLUS (SEPSIS)
1000.0000 mL | Freq: Once | INTRAVENOUS | Status: AC
  Administered 2017-04-07: 1000 mL via INTRAVENOUS

## 2017-04-07 MED ORDER — SODIUM CHLORIDE 0.9 % IV SOLN
1000.0000 mL | INTRAVENOUS | Status: DC
  Administered 2017-04-07 – 2017-04-08 (×2): 1000 mL via INTRAVENOUS

## 2017-04-07 MED ORDER — METOPROLOL TARTRATE 12.5 MG HALF TABLET
12.5000 mg | ORAL_TABLET | Freq: Two times a day (BID) | ORAL | Status: DC
  Administered 2017-04-07 – 2017-04-11 (×8): 12.5 mg via ORAL
  Filled 2017-04-07 (×9): qty 1

## 2017-04-07 MED ORDER — SODIUM CHLORIDE 0.9 % IV SOLN: 1000.0000 mL | INTRAVENOUS | Status: DC

## 2017-04-07 NOTE — Care Management Note (Signed)
Case Management Note  Patient Details  Name: Kayla Wyatt MRN: [id number] Date of Birth: 07/22/1955  Subjective/Objective:  Pt admitted with abd pain - GI bleed and now with A fib RVR                  Action/Plan:  PTA independent from home.  Pt now on Cardizem drip.  CM will continue to follow for discharge  needs   Expected Discharge Date:                  Expected Discharge Plan:  Home/Self Care(independent from home)  In-House Referral:     Discharge planning Services  CM Consult  Post Acute Care Choice:    Choice offered to:     DME Arranged:    DME Agency:     HH Arranged:    HH Agency:     Status of Service:     If discussed at [employer], dates discussed:    Additional Comments:  Maryclare Labrador, RN 04/07/2017, 10:42 AM

## 2017-04-07 NOTE — Progress Notes (Signed)
CRITICAL VALUE ALERT  Critical Value:  Hemoglobin 6.0  Date & Time Notied:  04/07/17 11:00  Provider Notified:Dr. Irene Pap  Orders Received/Actions taken: Dr. [place] Crane ordered 3 units of blood.

## 2017-04-07 NOTE — Progress Notes (Signed)
Called to room by patients primary nurse stating that patient has had another bloody bowel movement totally 4 stools this morning and that patient is now symptomatic. Manual blood pressure reading taken and documented below. Dr. Nevada Crane called and notified new orders received for emergent blood and an additional saline bolus. Orders implemented and will continue to monitor     04/07/17 1221  Vitals  Temp 97.8 F (36.6 C)  Temp Source Oral  BP (!) 80/42  BP Location Left Arm  BP Method Manual  Patient Position (if appropriate) Lying  Pulse Rate (!) 103  Pulse Rate Source Monitor  ECG Heart Rate (!) 104  Cardiac Rhythm ST  Resp (!) 29  Oxygen Therapy  SpO2 100 %  O2 Device Room Air  Pulse Oximetry Type Continuous

## 2017-04-07 NOTE — Progress Notes (Signed)
Telemetry call for PT. HR in 200s. Pt was using the bedside commode. Pt said she could feel her heart racing but denied chest pain. Charge nurse called. Rapid response called. Paged MD. No answer from MD paged 5 minutes later. [facility] Admission Called after 4 tries to MD. MD came to pts. Room.

## 2017-04-07 NOTE — Consult Note (Signed)
Cardiology Consultation:   Patient ID: Kenae Lindquist; [id number]; 02/24/1956   Admit date: 04/02/2017 Date of Consult: 04/07/2017  Primary Care Provider: [name], MD Primary Cardiologist: New patient Primary Electrophysiologist:  N/A   Patient Profile:   Rahma Meller is a 62 y.o. female with a hx of HTN and HLD who is being seen today for the evaluation of new diagnosis atrial fibrillation with RVR at the request of Dr. Nevada Crane.  History of Present Illness:   Ms. Morais presented to the ED on 04/02/2017 with lower abdominal pain, bloody diarrhea, and nausea/vomiting. She was hypotensive, tachycardic, and pale at the time. EGD on 04/03/2017 showed gastric ulcer with visible vessels. GI bleed treated with clip, no bleeding at the end of the procedure.  Cardiology was consulted for new atrial fibrillation with RVR on telemetry on 04/06/2017 early am. Echo on 04/06/2017 showed normal wall thickness and motion, LVEF 55-60%, and grade 2 diastolic dysfunction.   Ms. Rochon reports shortness of breath when getting out of bed and going to the bathroom. She also notes some lightheadedness when sitting/standing up.  Prior to admission, her most strenuous activity was lifting heavy boxes at work. She was able to do this without becoming short of breath. She was also able to climb stairs without becoming short of breath.  She notes some lower leg edema that is worse on the right leg since she has been in the hospital. She denies any orthopnea or PND. She is weak and SOB with minimal exertion.  She denies any chest pain or palpitations. She states she has never been told she has an arrhythmia before.   Of note, she spontaneously converted to SR this am (<36 hr after onset of afib). Pt states she never had palpitations, was unable to tell when she went into afib, or back into SR.  However, her general medical condition had worsened this am w/ H&H 8.1/24.3 dropping to 6.0/17.8. She is being  transfused.   Her BP has been low at times, Cardizem currently off.    Past Medical History:  Diagnosis Date  . Acute blood loss anemia 04/02/2017  . Gastrointestinal hemorrhage with melena   . Hypercholesteremia   . Hypertension   . Persistent atrial fibrillation with rapid ventricular response ([place]) 04/07/2017  . Vitamin D deficiency     Past Surgical History:  Procedure Laterality Date  . ESOPHAGOGASTRODUODENOSCOPY N/A 04/03/2017   Procedure: ESOPHAGOGASTRODUODENOSCOPY (EGD);  Surgeon: Carol Ada, MD;  Location: [facility];  Service: Endoscopy;  Laterality: N/A;  . LEG SURGERY  1974   Blood Clot Removal      Prior to Admission medications   Medication Sig Start Date End Date Taking? Authorizing Provider  aspirin 81 MG tablet Take 81 mg by mouth daily.   Yes [provider]  cholecalciferol (VITAMIN D) 1000 units tablet Take 1,000 Units by mouth 2 (two) times daily.   Yes [provider]  lisinopril-hydrochlorothiazide (PRINZIDE,ZESTORETIC) 20-12.5 MG tablet Take 1 tablet by mouth daily.   Yes [provider]  simvastatin (ZOCOR) 20 MG tablet Take 1 tablet by mouth daily. 01/02/17  Yes [provider]    Inpatient Medications: Scheduled Meds: . feeding supplement (ENSURE ENLIVE)  237 mL Oral BID BM  . metoprolol tartrate  12.5 mg Oral BID  . pantoprazole  40 mg Oral BID  . simvastatin  20 mg Oral Daily  . sodium chloride flush  3 mL Intravenous Q12H   Continuous Infusions: . sodium chloride 1,000 mL (04/07/17  1016)  . sodium chloride    . cefTRIAXone (ROCEPHIN)  IV Stopped (04/06/17 1644)  . diltiazem (CARDIZEM) infusion Stopped (04/07/17 1224)   PRN Meds: acetaminophen **OR** acetaminophen, nicotine, ondansetron **OR** ondansetron (ZOFRAN) IV  Allergies:   No Known Allergies  Social History:   Social History   Socioeconomic History  . Marital status: Unknown    Spouse name: Not on file  . Number of children: Not on file    . Years of education: Not on file  . Highest education level: Not on file  Social Needs  . Financial resource strain: Not on file  . Food insecurity - worry: Not on file  . Food insecurity - inability: Not on file  . Transportation needs - medical: Not on file  . Transportation needs - non-medical: Not on file  Occupational History  . Occupation: Has to lift heavy boxes at times.    Employer: [employer]  Tobacco Use  . Smoking status: Current Every Day Smoker  . Smokeless tobacco: Never Used  Substance and Sexual Activity  . Alcohol use: No    Frequency: Never  . Drug use: No  . Sexual activity: Not on file  Other Topics Concern  . Not on file  Social History Narrative  . Not on file    Family History:   History reviewed. No pertinent family history. Family Status:  No family status information on file.    ROS:  Please see the history of present illness.  All other ROS reviewed and negative.     Physical Exam/Data:   Vitals:   04/07/17 0904 04/07/17 0948 04/07/17 1005 04/07/17 1221  BP: 129/66 102/68 (!) 133/57 (!) 80/42  Pulse: (!) 132 (!) 135 (!) 135   Resp: (!) 27  (!) 38   Temp:      TempSrc:      SpO2: 98%  98%   Weight:      Height:        Intake/Output Summary (Last 24 hours) at 04/07/2017 1229 Last data filed at 04/07/2017 0235 Gross per 24 hour  Intake 222 ml  Output 200 ml  Net 22 ml   Filed Weights   04/05/17 0523 04/06/17 0357 04/07/17 0537  Weight: 142 lb 6.7 oz (64.6 kg) 145 lb 1 oz (65.8 kg) 148 lb 2.4 oz (67.2 kg)   Body mass index is 26.24 kg/m.  General:  62 y.o. Caucasian female appears slightly uncomfortable but lying in the bed but in no acute distress HEENT: normal Lymph: no adenopathy Neck: no JVD Endocrine:  No thryomegaly Vascular: No carotid bruits; upper extremity pulses 2+, LE pulses decreased Cardiac:  Tachycardic, slightly irregular rhythm, soft murmur  Lungs:  clear to auscultation bilaterally, no wheezing, rhonchi or  rales  Abd: soft, nontender, no hepatomegaly  Ext: trace edema in R lower extremity, + right calf tenderness with palpation, positive Homan's sign on right leg  Musculoskeletal:  No deformities, BUE and BLE strength weak but equal Skin: warm and dry, very pale  Neuro:  CNs 2-12 intact, no focal abnormalities noted Psych:  Normal affect   EKG TODAY (04/07/2017):  The EKG was personally reviewed and demonstrates: atrial fibrillation with ventricular rate of 159, diffuse ST depression EKG 04/06/2017: Atypical atrial flutter vs coarse fib with ventricular rate 88 Telemetry:  Telemetry was personally reviewed and demonstrates:  - SR/ST with frequent PAC until 04/06/17 at 4:00am with start of atrial flutter with RVR (120-140), spontaneous conversion to SR this am at  about 11:30 - HR spiked to 168 at 8:54am today  Relevant CV Studies:  ECHO 04/06/2017: Study Conclusions - Left ventricle: The cavity size was normal. Wall thickness was   normal. Systolic function was normal. The estimated ejection   fraction was in the range of 55% to 60%. Wall motion was normal;   there were no regional wall motion abnormalities. Features are   consistent with a pseudonormal left ventricular filling pattern,   with concomitant abnormal relaxation and increased filling   pressure (grade 2 diastolic dysfunction). - Mitral valve: There was mild regurgitation. - Left atrium: The atrium was mildly dilated. - Atrial septum: There was increased thickness of the septum,   consistent with lipomatous hypertrophy.  Laboratory Data:  Chemistry Recent Labs  Lab 04/04/17 0654 04/05/17 0447 04/07/17 1006  NA 147* 145 144  K 3.1* 4.0 3.3*  CL 114* 115* 113*  CO2 24 21* 24  GLUCOSE 108* 104* 109*  BUN 19 12 17   CREATININE 0.69 0.60 0.51  CALCIUM 8.2* 7.9* 7.7*  GFRNONAA >60 >60 >60  GFRAA >60 >60 >60  ANIONGAP 9 9 7     Lab Results  Component Value Date   ALT 40 04/07/2017   AST 24 04/07/2017   ALKPHOS 51  04/07/2017   BILITOT 0.3 04/07/2017   Hematology Recent Labs  Lab 04/05/17 0447 04/06/17 0211 04/07/17 1006  WBC 10.4 13.7* 18.5*  RBC 2.65* 2.67* 1.93*  HGB 7.9* 8.1* 6.0*  HCT 23.8* 24.3* 17.8*  MCV 89.8 91.0 92.2  MCH 29.8 30.3 31.1  MCHC 33.2 33.3 33.7  RDW 15.2 15.2 15.5  PLT 228 262 238   Cardiac Enzymes  Recent Labs  Lab 04/02/17 1557  TROPIPOC 0.01    Magnesium:  Magnesium  Date Value Ref Range Status  04/03/2017 2.3 1.7 - 2.4 mg/dL Final    Radiology/Studies:  Ct Angio Abd/pel W/ And/or W/o  Result Date: 04/04/2017 CLINICAL DATA:  62 year old female with a history of ischemia. Possible ischemic bowel. EXAM: CTA ABDOMEN AND PELVIS wITHOUT AND WITH CONTRAST TECHNIQUE: Multidetector CT imaging of the abdomen and pelvis was performed using the standard protocol during bolus administration of intravenous contrast. Multiplanar reconstructed images and MIPs were obtained and reviewed to evaluate the vascular anatomy. CONTRAST:  <See Chart> ISOVUE-370 IOPAMIDOL (ISOVUE-370) INJECTION 76% COMPARISON:  04/02/2017 FINDINGS: VASCULAR Aorta: Mild atherosclerotic changes of the distal thoracic aorta. Greatest diameter at the aortic hiatus measures 19 mm. Advanced mixed calcified and soft plaque of the infrarenal abdominal aorta high-grade stenosis near the origin of the inferior mesenteric artery secondary to mixed calcified and soft plaque. Small caliber aorta, measuring 13 mm at this site. No periaortic fluid. No aneurysm. Celiac: There is a separate origin of the left gastric artery just above the celiac artery origin. There is approximately 50% stenosis at the origin of the left gastric artery. Mild soft atherosclerotic plaque at the origin of the celiac artery without significant stenosis on coronal reformatted images. Splenic artery demonstrates high-grade stenosis in the proximal third secondary to calcified plaque. Atherosclerotic changes of the common hepatic artery, resulting  in stenosis proximal to the GDA origin with poststenotic dilation. SMA: Minimal atherosclerotic changes at the origin of the superior mesenteric artery. A proximal rightward branch of the superior mesenteric artery is enlarged, representative of likely middle colic artery and collateral flow to the inferior mesenteric artery (meandering artery of [place]/arc of Riolan). Renals: Main right renal artery with significant atherosclerotic changes, likely greater than 50% stenosis at the  origin. There is a small accessory right renal artery just below the main right renal artery. There are 3 left renal arteries, the most superior 2 of which demonstrate approximately 50% stenosis at the origin. A small inferior left renal artery to the lower pole is patent. IMA: Inferior mesenteric artery demonstrates significant stenosis at the origin though remains patent at the origin. The origin is at the site of greatest stenosis of the abdominal aorta. Right lower extremity: Moderate mixed calcified and soft plaque of the right iliac artery with no aneurysm or dissection. Hypogastric arteries patent. Minimal atherosclerotic changes of the external iliac artery and common femoral artery. Proximal profunda femoris and SFA are patent. Left lower extremity: Moderate mixed calcified and soft plaque of the left common iliac artery without significant stenosis or occlusion. No dissection or aneurysm. Hypogastric arteries patent. Minimal atherosclerotic changes of the external iliac artery and common femoral artery. Proximal left SFA and profunda femoris are patent. Veins: Unremarkable appearance of the venous system. Review of the MIP images confirms the above findings. NON-VASCULAR Lower chest: Unremarkable. Hepatobiliary: Unremarkable appearance of the liver. Unremarkable gall bladder. Pancreas: Unremarkable pancreas Spleen: Unremarkable spleen Adrenals/Urinary Tract: Unremarkable appearance of the right adrenal gland. Nodular contour  of the lateral limb of the left adrenal gland, with no focal nodule identified. Right: Symmetric perfusion of the bilateral kidneys. No right-sided hydronephrosis. No nephrolithiasis. Left: Symmetric perfusion of the left kidney. No left-sided hydronephrosis or nephrolithiasis. Unremarkable course of the left ureter. Small urethrocele Stomach/Bowel: Unremarkable stomach. No abnormal distention of small bowel. No transition point. The mucosa of small bowel and colon appears to enhance throughout the length of bowel. No dilation. No evidence of free air or significant free intraperitoneal fluid. Normal appendix. Colonic diverticula are present without associated inflammatory changes. No large stool burden. Fluid filled colon. Lymphatic: No adenopathy. There are small lymph nodes in the periaortic/preaortic nodal station. Mesenteric: No free fluid or air. No adenopathy. Reproductive: Nonspecific pelvic floor laxity.  Hysterectomy. Other: No hernia. Musculoskeletal: Multilevel degenerative changes of the spine. No displaced fracture. IMPRESSION: No acute CT finding. The enhancement pattern of small bowel and colon mucosa is unremarkable, with no CT evidence of ischemic bowel. The colon is partially fluid-filled, which may reflect a nonspecific enteritis/colitis. No CT evidence of diverticulitis. Significant aortic atherosclerosis, with focal high-grade aortic stenosis secondary to mixed calcified and soft plaque, occurring at the level of the inferior mesenteric artery. If there is concern for short distance claudication, correlation with noninvasive lower extremity exam may be useful. Aortic Atherosclerosis (ICD10-I70.0). Atherosclerotic changes at the origin of the mesenteric arteries, however, only the IMA demonstrates evidence of high-grade stenosis, and there appears to be good collateral flow to the IMA distribution secondary to enlarged meandering mesenteric artery/arc of [place]. It is uncertain on the CTA as to  what degree atherosclerotic changes/stenosis of the common hepatic artery are contributing to flow compromise. There are 2 right renal arteries and 3 left renal arteries. Atherosclerotic changes involve the origin of all of the renal arteries, with at least 50% stenosis secondary to mixed calcified and soft plaque at the origin of the largest bilateral renal arteries. Nonspecific pelvic floor laxity, with associated cystocele. Diverticular disease without evidence of acute diverticulitis. Signed, Dulcy Fanny. Earleen Newport, DO [facility] Radiology Electronically Signed   By: Corrie Mckusick D.O.   On: 04/04/2017 08:51    Assessment and Plan:   Principal Problem:   Gastrointestinal hemorrhage with melena Active Problems:  Acute blood loss anemia   Lower abdominal pain of unknown etiology   Abnormal computed tomography angiography (CTA) of abdomen and pelvis   Persistent atrial fibrillation with rapid ventricular response ([facility])  1. Atrial Flutter/Fibrillation with RVR - New onset of atrial fibrillation with RVR on 04/06/2017 at 4:00am - Echo 04/06/2017 showed LVEF of 55-60% with grade 2 diastolic dysfunction and normal wall thickness and motion - Patient is currently on Lopressor and IV Cardizem was recently added which seems to be helping HR some.  - Suspect natural physiology response to anemia is contributing to tachycardia. Hgb today 6.0 (dropped from 8.1 yesterday).  - Given GI bleed from gastric ulcer, unable to give blood thinner.  - Patient was on Telemetry when she went into atrial fibrillation and it has been less than 48 hrs. Given rate is difficult control and she cannot be anticoagulated, consider cardioversion. Will discuss with MD.   2. HTN - Continue Cardizem and Lopressor. - Restart home medication (Lisinopril-HCTZ 20-12.5mg ) when able.  3. HLD - Continue simvastatin 20mg  daily  4. Lower Leg Edema - Trace edema in bilateral lower  extremities (R>L). Positive Homan's sign on right. - Order venous doppler   5. GI Bleed with Acute Blood Loss Anemia - Patient received 2 units of blood in ED - EGD 04/03/2017 showed gastric ulcer with visible vessels - Hgb 6.0 (dropped from 8.1 yesterday) - Per IM and GI   For questions or updates, please contact [facility] Please consult [email or web address] for contact info under Cardiology/STEMI.   Signed, Rosaria Ferries, PA-C  04/07/2017 12:29 PM  As above, patient seen and examined.  Briefly she is a 62 year old female with past medical history of hypertension and hyperlipidemia admitted with a GI bleed for evaluation of paroxysmal atrial fibrillation.  Patient admitted on January 24 with GI bleed.  EGD showed gastric ulcer which was clipped.  She has been noted to have paroxysmal atrial fibrillation on telemetry and cardiology asked to evaluate.  At present patient has developed recurrent melena/hematochezia.  She denies dyspnea, chest pain, palpitations or syncope.  She is complaining of abdominal pain. Hemoglobin is 6.  Echo shows normal LV function, mild LAE and mild mitral regurgitation.  Electrocardiogram shows atrial fibrillation with nonspecific ST changes.  1 paroxysmal atrial fibrillation-patient going in and out of atrial fibrillation at time of evaluation.  Blood pressure is borderline.  She has active bleeding.  We will begin IV amiodarone to maintain sinus rhythm.  This could likely be discontinued in 6-8 weeks.CHADSvasc 3.  She is not a candidate for anticoagulation at this point given active GI bleeding.  We will consider in the future.  Check TSH.  LV function is normal.  2 GI bleed-is actively bleeding again.  Await GI input.  3 vascular disease-noted on abdominal CTA.  This will be followed up as an outpatient. Kirk Ruths, MD

## 2017-04-07 NOTE — Progress Notes (Signed)
*  Preliminary Results* Right lower extremity venous duplex completed. Right lower extremity is positive for acute intramuscular deep vein thrombosis involving the right gastrocnemius veins. There is no evidence of right Baker's cyst.  Preliminary results discussed with Loree Fee, RN.  04/07/2017 2:20 PM  Maudry Mayhew, BS, RVT, RDCS, RDMS

## 2017-04-07 NOTE — Progress Notes (Signed)
Called both lab and blood blank to determine if a new hemoglobin and hematocrit could be added onto the recently drawn type and screen but this could not be done because the type and screen has already been spun down.

## 2017-04-07 NOTE — Progress Notes (Signed)
PROGRESS NOTE  Casy Tavano QJJ:[id number] DOB: 11-30-1955 DOA: 04/02/2017 PCP: [name], MD  HPI/Recap of past 24 hours: Addilyn Satterwhite is a 62 y.o. female past medical history significant for high blood pressure high cholesterol presents with abdominal pain and diarrhea for about 6 days. Recent use of NSAIDs. FOBT positive in the ED hg 6.4 requiring blood transfusion 2U PRBCs. GI consulted. Post EGD this am revealing a ulcerative vessel that was clipped.  Update: Contacted The faculty MD on call who stated that uterine prolapses are not painful and would not be the source of her pain. They will contact the patient to set up an appointment in the outpatient setting. 04/06/17: new A-fib rvr reported overnight. 12 lead EKG repeated this am. 2D echo. CHADSVASC  Score 2, moderate high risk. Not starting anticoagulation due to recent acute GI bleed  04/07/17: RN reports a-fib rvr. Lopressor IV 5 mg given once with mild improvement. Another dose 2.5 mg IV given. Appears in flutter now. Acute GI bleed with marroon color stools x 2 in less than 30 minutes. Denies abdominal pain. IV fluid boluses given BP stablelizing. IV cardizem drip started. Dr [name] informed of these changes.   04/07/17 Patient reports right LE tenderness on palpation U/S positive for DVT RLE. Not anticoagulated due to acute GI bleed. CTA PE ordered and RBC scan ordered.   Assessment/Plan: Active Problems:   Acute blood loss anemia   Gastrointestinal hemorrhage with melena   Lower abdominal pain of unknown etiology   Abnormal computed tomography angiography (CTA) of abdomen and pelvis  Acute blood loss anemia in the setting of gastric ulcer/upper GI bleed -hg 6.8, FOBT + at presentation; transfused 2Uprbcs on admission -04/07/17 hg 6.0; 3u prbcs ordered for transfusion due to active GI bleeding with marroon stools -EGD done 04/03/17 revealed: A single visible vessel in the setting of a mild ulceration was found in the  stomach. Clipped. -CTA abdomen to r/o ischemia>> stenosis of IMA with collaterals -GI following; we appreciate recommendations -04/07/17: Marroon stools x 3 BMs, drop in hg 6.0; 3U prbcs ordered for transfusion -RBCs scan -IV fluid boluses x 2L -NS IV 100cc/hr -protonix drip started 04/07/17 -Maintain MAP 65 or above  Afib RVR (04/07/17) -cardiology consulted -amiodarone drip -IV fluid to maintain MAP 65 or greater -lopressor po 12.5 mg BID  Suspected diverticular bleed -04/07/17: Marroon stools x 3 BM -No abd pain -hg drop to 6.0 -3U prbcs -GI made aware -RBCs scan ordered -NPO now; possible sigmoid colonoscopy by GI today  Newly diagnosed RLE DVT (04/07/17) -RLE tenderness>>> duplex U/S RLE positive for DVT (04/07/17) -not anticoagulated due to active GI bleed -Avoid SCDs in LE -CTA PE ordered due to high probability for PE -May consider IVC filter  E-coli bacteremia, pansensitive -1/2 blood cx positive for e-coli -continue IV ceftriaxone 2 gms daily day# 4 -repeat blood cx x 2 04/04/17-no growth to date  New onset A-fib with RVR -2D echo 04/06/17 LVEF 48-18% (grade 2 diastolic dysfunction). - Mitral valve: There was mild regurgitation. - Left atrium: The atrium was mildly dilated. - Atrial septum: There was increased thickness of the septum,   consistent with lipomatous hypertrophy -No anticoagulation due to recent acute GI bleed -telemetry  Intractable lower abdominal pain in the setting of upper GI bleed, resolved -continue PPI  Hypokalemia, resolved -K+ 4.0 from 3.1 from 3.3 from 2.6 -repleted as indicated -BMP am  QT prolongation -Mg and Phos normal 04/03/17 -Avoid medication that would worsen QT prolongation  UTI, poa -urine Cultures taken multiple species- recommend recollection -continue IV antibiotics  Hypertension -BP stable -Hold oral antihypertensive meds to avoid hypotension  Hyperlipidemia Restart statin  Moderate protein calorie  malnutrition -albumin 2.1 (04/02/17) -encourage to increase po intake -oral supplements  Acute Transaminitis -improving -LFTs trending down -avoid hepatotoxic agents   Code Status: full   Family Communication: brother at her bedside   Disposition Plan: will stay another midnight  Due to active GI bleeding and newly diagnosed RLE DVT with several workup pending.  SEVERITY: Severe due to advanced age, active GI bleed complicated by bacteremia, new a-fib rvr, new RLE DVT.   Consultants:  GI  Gynecology  Cardiology (will be called this am)   Procedures:  EGD 04/03/17  Antimicrobials:  IV ceftriaxone  DVT prophylaxis: None due to active GI bleeding and RLE DVT   Objective: Vitals:   04/07/17 0710 04/07/17 0855 04/07/17 0904 04/07/17 0948  BP: (!) 157/61 (!) 138/109 129/66 102/68  Pulse:  (!) 167 (!) 132 (!) 135  Resp: 18 (!) 24 (!) 27   Temp:      TempSrc:      SpO2:  98% 98%   Weight:      Height:        Intake/Output Summary (Last 24 hours) at 04/07/2017 1014 Last data filed at 04/07/2017 0235 Gross per 24 hour  Intake 222 ml  Output 200 ml  Net 22 ml   Filed Weights   04/05/17 0523 04/06/17 0357 04/07/17 0537  Weight: 64.6 kg (142 lb 6.7 oz) 65.8 kg (145 lb 1 oz) 67.2 kg (148 lb 2.4 oz)    Exam: pateint seen and examined 04/07/17. No changes in her physical exam.   General:  62 yo CF WD WN NAD A&O x3  Cardiovascular: RRR no rubs or gallops  Respiratory: CTA no wheezes or rales  Abdomen: soft NT ND NBS x4  Musculoskeletal: RLE severly tender to palpation, mildly edematous  Skin: no noted rash-pale due to acute blood loss anemia.  Psychiatry: Mood appropriate for condition and setting   Data Reviewed: CBC: Recent Labs  Lab 04/02/17 1459  04/02/17 1932 04/03/17 0409 04/04/17 0654 04/05/17 0447 04/06/17 0211  WBC 16.2*  --   --  12.3* 8.8 10.4 13.7*  HGB 8.0*   < > 6.8* 8.7* 8.1* 7.9* 8.1*  HCT 22.8*   < > 20.0* 24.8* 23.5* 23.8*  24.3*  MCV 88.4  --   --  85.8 87.4 89.8 91.0  PLT 249  --   --  200 242 228 262   < > = values in this interval not displayed.   Basic Metabolic Panel: Recent Labs  Lab 04/02/17 1459 04/02/17 1559 04/02/17 1932 04/03/17 0409 04/04/17 0654 04/05/17 0447  NA 138 138 141 142 147* 145  K 2.6* 2.6* 2.8* 3.3* 3.1* 4.0  CL 99* 100* 106 111 114* 115*  CO2 17*  --   --  22 24 21*  GLUCOSE 225* 220* 93 103* 108* 104*  BUN 116* 100* 85* 73* 19 12  CREATININE 1.49* 1.30* 0.90 0.74 0.69 0.60  CALCIUM 8.4*  --   --  7.9* 8.2* 7.9*  MG  --   --   --  2.3  --   --   PHOS  --   --   --  2.3*  --   --    GFR: Estimated Creatinine Clearance: 65.4 mL/min (by C-G formula based on SCr of 0.6 mg/dL). Liver Function  Tests: Recent Labs  Lab 04/02/17 1459 04/05/17 1504  AST 106* 47*  ALT 162* 70*  ALKPHOS 118 64  BILITOT 0.7 0.5  PROT 5.2* 5.2*  ALBUMIN 2.1* 2.2*   Recent Labs  Lab 04/02/17 1459  LIPASE 17   No results for input(s): AMMONIA in the last 168 hours. Coagulation Profile: Recent Labs  Lab 04/02/17 1459 04/03/17 0409  INR 1.14 1.10   Cardiac Enzymes: No results for input(s): CKTOTAL, CKMB, CKMBINDEX, TROPONINI in the last 168 hours. BNP (last 3 results) No results for input(s): PROBNP in the last 8760 hours. HbA1C: No results for input(s): HGBA1C in the last 72 hours. CBG: No results for input(s): GLUCAP in the last 168 hours. Lipid Profile: No results for input(s): CHOL, HDL, LDLCALC, TRIG, CHOLHDL, LDLDIRECT in the last 72 hours. Thyroid Function Tests: No results for input(s): TSH, T4TOTAL, FREET4, T3FREE, THYROIDAB in the last 72 hours. Anemia Panel: No results for input(s): VITAMINB12, FOLATE, FERRITIN, TIBC, IRON, RETICCTPCT in the last 72 hours. Urine analysis:    Component Value Date/Time   COLORURINE YELLOW 04/02/2017 1705   APPEARANCEUR CLOUDY (A) 04/02/2017 1705   LABSPEC 1.008 04/02/2017 1705   PHURINE 5.0 04/02/2017 1705   GLUCOSEU NEGATIVE  04/02/2017 1705   HGBUR LARGE (A) 04/02/2017 1705   BILIRUBINUR NEGATIVE 04/02/2017 1705   KETONESUR NEGATIVE 04/02/2017 1705   PROTEINUR NEGATIVE 04/02/2017 1705   NITRITE NEGATIVE 04/02/2017 1705   LEUKOCYTESUR LARGE (A) 04/02/2017 1705   Sepsis Labs: @LABRCNTIP (procalcitonin:4,lacticidven:4)  ) Recent Results (from the past 240 hour(s))  Culture, blood (Routine x 2)     Status: Abnormal   Collection Time: 04/02/17  3:40 PM  Result Value Ref Range Status   Specimen Description BLOOD RIGHT FOREARM  Final   Special Requests   Final    BOTTLES DRAWN AEROBIC AND ANAEROBIC Blood Culture adequate volume   Culture  Setup Time   Final    GRAM NEGATIVE RODS AEROBIC BOTTLE ONLY CRITICAL RESULT CALLED TO, READ BACK BY AND VERIFIED WITH: JCARNEY,PHARMD @ 2219 04/03/17 BY LHOWARD    Culture ESCHERICHIA COLI (A)  Final   Report Status 04/05/2017 FINAL  Final   Organism ID, Bacteria ESCHERICHIA COLI  Final      Susceptibility   Escherichia coli - MIC*    AMPICILLIN <=2 SENSITIVE Sensitive     CEFAZOLIN <=4 SENSITIVE Sensitive     CEFEPIME <=1 SENSITIVE Sensitive     CEFTAZIDIME <=1 SENSITIVE Sensitive     CEFTRIAXONE <=1 SENSITIVE Sensitive     CIPROFLOXACIN <=0.25 SENSITIVE Sensitive     GENTAMICIN <=1 SENSITIVE Sensitive     IMIPENEM <=0.25 SENSITIVE Sensitive     TRIMETH/SULFA <=20 SENSITIVE Sensitive     AMPICILLIN/SULBACTAM <=2 SENSITIVE Sensitive     PIP/TAZO <=4 SENSITIVE Sensitive     Extended ESBL NEGATIVE Sensitive     * ESCHERICHIA COLI  Blood Culture ID Panel (Reflexed)     Status: Abnormal   Collection Time: 04/02/17  3:40 PM  Result Value Ref Range Status   Enterococcus species NOT DETECTED NOT DETECTED Final   Listeria monocytogenes NOT DETECTED NOT DETECTED Final   Staphylococcus species NOT DETECTED NOT DETECTED Final   Staphylococcus aureus NOT DETECTED NOT DETECTED Final   Streptococcus species NOT DETECTED NOT DETECTED Final   Streptococcus agalactiae NOT  DETECTED NOT DETECTED Final   Streptococcus pneumoniae NOT DETECTED NOT DETECTED Final   Streptococcus pyogenes NOT DETECTED NOT DETECTED Final   Acinetobacter baumannii NOT DETECTED NOT  DETECTED Final   Enterobacteriaceae species DETECTED (A) NOT DETECTED Final    Comment: Enterobacteriaceae represent a large family of gram-negative bacteria, not a single organism. CRITICAL RESULT CALLED TO, READ BACK BY AND VERIFIED WITH: JCARNEY,PHARMD @2219  04/03/17 BY LHOWARD    Enterobacter cloacae complex NOT DETECTED NOT DETECTED Final   Escherichia coli DETECTED (A) NOT DETECTED Final    Comment: CRITICAL RESULT CALLED TO, READ BACK BY AND VERIFIED WITH: JCARNEY,PHARMD @2219  04/03/17 BY LHOWARD    Klebsiella oxytoca NOT DETECTED NOT DETECTED Final   Klebsiella pneumoniae NOT DETECTED NOT DETECTED Final   Proteus species NOT DETECTED NOT DETECTED Final   Serratia marcescens NOT DETECTED NOT DETECTED Final   Carbapenem resistance NOT DETECTED NOT DETECTED Final   Haemophilus influenzae NOT DETECTED NOT DETECTED Final   Neisseria meningitidis NOT DETECTED NOT DETECTED Final   Pseudomonas aeruginosa NOT DETECTED NOT DETECTED Final   Candida albicans NOT DETECTED NOT DETECTED Final   Candida glabrata NOT DETECTED NOT DETECTED Final   Candida krusei NOT DETECTED NOT DETECTED Final   Candida parapsilosis NOT DETECTED NOT DETECTED Final   Candida tropicalis NOT DETECTED NOT DETECTED Final  Culture, blood (Routine x 2)     Status: None   Collection Time: 04/02/17  3:44 PM  Result Value Ref Range Status   Specimen Description BLOOD RIGHT FOREARM  Final   Special Requests   Final    BOTTLES DRAWN AEROBIC AND ANAEROBIC Blood Culture adequate volume   Culture NO GROWTH 5 DAYS  Final   Report Status 04/07/2017 FINAL  Final  Urine culture     Status: Abnormal   Collection Time: 04/02/17  5:05 PM  Result Value Ref Range Status   Specimen Description URINE, RANDOM  Final   Special Requests NONE  Final     Culture MULTIPLE SPECIES PRESENT, SUGGEST RECOLLECTION (A)  Final   Report Status 04/03/2017 FINAL  Final  MRSA PCR Screening     Status: None   Collection Time: 04/02/17  8:43 PM  Result Value Ref Range Status   MRSA by PCR NEGATIVE NEGATIVE Final    Comment:        The GeneXpert MRSA Assay (FDA approved for NASAL specimens only), is one component of a comprehensive MRSA colonization surveillance program. It is not intended to diagnose MRSA infection nor to guide or monitor treatment for MRSA infections.   Culture, blood (routine x 2)     Status: None (Preliminary result)   Collection Time: 04/04/17  3:16 PM  Result Value Ref Range Status   Specimen Description BLOOD LEFT ANTECUBITAL  Final   Special Requests IN PEDIATRIC BOTTLE Blood Culture adequate volume  Final   Culture NO GROWTH 3 DAYS  Final   Report Status PENDING  Incomplete  Culture, blood (routine x 2)     Status: None (Preliminary result)   Collection Time: 04/04/17  3:19 PM  Result Value Ref Range Status   Specimen Description BLOOD LEFT HAND  Final   Special Requests IN PEDIATRIC BOTTLE Blood Culture adequate volume  Final   Culture NO GROWTH 3 DAYS  Final   Report Status PENDING  Incomplete  Culture, Urine     Status: None   Collection Time: 04/05/17 12:19 PM  Result Value Ref Range Status   Specimen Description URINE, RANDOM  Final   Special Requests NONE  Final   Culture NO GROWTH  Final   Report Status 04/06/2017 FINAL  Final      Studies:  No results found.  Scheduled Meds: . feeding supplement (ENSURE ENLIVE)  237 mL Oral BID BM  . metoprolol tartrate  12.5 mg Oral BID  . pantoprazole  40 mg Oral BID  . polyethylene glycol-electrolytes  4,000 mL Oral Once  . simvastatin  20 mg Oral Daily  . sodium chloride flush  3 mL Intravenous Q12H    Continuous Infusions: . sodium chloride    . cefTRIAXone (ROCEPHIN)  IV Stopped (04/06/17 1644)  . diltiazem (CARDIZEM) infusion    . sodium chloride        LOS: 5 days     Kayleen Memos, MD [facility] Pager [phone]  If 7PM-7AM, please contact night-coverage [email or web address] Password [email or web address] 04/07/2017, 10:14 AM

## 2017-04-07 NOTE — Progress Notes (Signed)
Pt went down to [place] labeled red blood cells nuclear test with charge nurse around 1600 then came back on the floor at 18:40. Finished second PRBC at the nuclear medicine. HS [employer]

## 2017-04-07 NOTE — Significant Event (Addendum)
Rapid Response Event Note  Overview: Time Called: 7124 Arrival Time: 5809 Event Type: Cardiac  Initial Focused Assessment: Patient admitted with abdominal pain/GI bleed.  Sp clip/ stomach Patient with intermittent RAF this admit. This am HR 200-170s  AF when OOB to BSC (maroon stool) Now back in bed, denies SOB or CP Lung sounds decreased bases, Heart tones irregular HR 150s BP 138/109  RR 24  O2 sat 98% on RA Brother at bedside  Interventions: 12 lead EKG done 5 mg Lopressor IV HR 120-130  With brief slowing to 90 then rapid again. BP 129/66 Dr Nevada Crane at bedside. Additional 2.5 mg Lopressor given IV, then 12.5 mg Lopressor given PO Order received for CBC and Cmet.  BP 102/68  HR 129 1L NS bolus  Plan start Cardizem gtt   Dr Nevada Crane spoke with Dr [name] regarding multiple maroon BMs this am.   Plan of Care (if not transferred):  Event Summary: Name of Physician Notified: Dr Nevada Crane at [place] in room and stabalized Event end: [place]    Raliegh Ip

## 2017-04-07 NOTE — Progress Notes (Signed)
Subjective: Feeling well.  She thinks her lower abdominal pain today is associated with how she slept.  Objective: Vital signs in last 24 hours: Temp:  [97.7 F (36.5 C)-98.1 F (36.7 C)] 98.1 F (36.7 C) (01/29 0234) Pulse Rate:  [75-112] 112 (01/29 0234) Resp:  [17-26] 26 (01/29 0234) BP: (136-162)/(58-60) 162/59 (01/29 0234) SpO2:  [95 %-100 %] 95 % (01/29 0234) Weight:  [67.2 kg (148 lb 2.4 oz)] 67.2 kg (148 lb 2.4 oz) (01/29 0537) Last BM Date: 04/05/17  Intake/Output from previous day: 01/28 0701 - 01/29 0700 In: 465 [P.O.:462; I.V.:3] Out: 200 [Urine:200] Intake/Output this shift: No intake/output data recorded.  General appearance: alert and no distress GI: soft, non-tender; bowel sounds normal; no masses,  no organomegaly  Lab Results: Recent Labs    04/05/17 0447 04/06/17 0211  WBC 10.4 13.7*  HGB 7.9* 8.1*  HCT 23.8* 24.3*  PLT 228 262   BMET Recent Labs    04/05/17 0447  NA 145  K 4.0  CL 115*  CO2 21*  GLUCOSE 104*  BUN 12  CREATININE 0.60  CALCIUM 7.9*   LFT Recent Labs    04/05/17 1504  PROT 5.2*  ALBUMIN 2.2*  AST 47*  ALT 70*  ALKPHOS 64  BILITOT 0.5  BILIDIR <0.1*  IBILI NOT CALCULATED   PT/INR No results for input(s): LABPROT, INR in the last 72 hours. Hepatitis Panel No results for input(s): HEPBSAG, HCVAB, HEPAIGM, HEPBIGM in the last 72 hours. C-Diff No results for input(s): CDIFFTOX in the last 72 hours. Fecal Lactopherrin No results for input(s): FECLLACTOFRN in the last 72 hours.  Studies/Results: No results found.  Medications:  Scheduled: . feeding supplement (ENSURE ENLIVE)  237 mL Oral BID BM  . pantoprazole  40 mg Oral BID  . polyethylene glycol-electrolytes  4,000 mL Oral Once  . simvastatin  20 mg Oral Daily  . sodium chloride flush  3 mL Intravenous Q12H   Continuous: . sodium chloride 1,000 mL (04/06/17 2210)  . cefTRIAXone (ROCEPHIN)  IV Stopped (04/06/17 1644)    Assessment/Plan: 1) S/p  hemoclipping of a visible vessel in the stomach. 2) Anemia. 3) Afib - New onset.   She is stable from the GI issues.  No real abdominal pain and her bleeding has resolved.  HGB is stable.  She can follow up as an outpatient for an outpatient colonoscopy and follow up of her anemia.  In the past she had a routine colonoscopy with [facility].  She is concerned about cost issues and feels that it will be more cost effective to follow up with [facility].  If she changes her mind I can see her in the office.  Plan: 1) Follow HGB. 2) Signing off from the GI standpoint.  LOS: 5 days   Kayla Wyatt D 04/07/2017, 7:08 AM

## 2017-04-08 ENCOUNTER — Encounter: Payer: Self-pay | Admitting: General Practice

## 2017-04-08 ENCOUNTER — Telehealth: Payer: Self-pay | Admitting: General Practice

## 2017-04-08 DIAGNOSIS — R222 Localized swelling, mass and lump, trunk: Secondary | ICD-10-CM

## 2017-04-08 DIAGNOSIS — I10 Essential (primary) hypertension: Secondary | ICD-10-CM

## 2017-04-08 LAB — COMPREHENSIVE METABOLIC PANEL
ALT: 33 U/L (ref 14–54)
AST: 20 U/L (ref 15–41)
Albumin: 1.9 g/dL — ABNORMAL LOW (ref 3.5–5.0)
Alkaline Phosphatase: 42 U/L (ref 38–126)
Anion gap: 8 (ref 5–15)
BUN: 10 mg/dL (ref 6–20)
CO2: 22 mmol/L (ref 22–32)
Calcium: 7.3 mg/dL — ABNORMAL LOW (ref 8.9–10.3)
Chloride: 114 mmol/L — ABNORMAL HIGH (ref 101–111)
Creatinine, Ser: 0.55 mg/dL (ref 0.44–1.00)
GFR calc Af Amer: >60 mL/min (ref >60)
GFR calc non Af Amer: >60 mL/min (ref >60)
Glucose, Bld: 113 mg/dL — ABNORMAL HIGH (ref 65–99)
Potassium: 3.3 mmol/L — ABNORMAL LOW (ref 3.5–5.1)
Sodium: 144 mmol/L (ref 135–145)
Total Bilirubin: 0.8 mg/dL (ref 0.3–1.2)
Total Protein: 4 g/dL — ABNORMAL LOW (ref 6.5–8.1)

## 2017-04-08 LAB — CBC
HCT: 24.6 % — ABNORMAL LOW (ref 36.0–46.0)
Hemoglobin: 8.8 g/dL — ABNORMAL LOW (ref 12.0–15.0)
MCH: 32.2 pg (ref 26.0–34.0)
MCHC: 35.8 g/dL (ref 30.0–36.0)
MCV: 90.1 fL (ref 78.0–100.0)
Platelets: 149 10*3/uL — ABNORMAL LOW (ref 150–400)
RBC: 2.73 MIL/uL — ABNORMAL LOW (ref 3.87–5.11)
RDW: 14.3 % (ref 11.5–15.5)
WBC: 21.1 10*3/uL — ABNORMAL HIGH (ref 4.0–10.5)

## 2017-04-08 LAB — BLOOD PRODUCT ORDER (VERBAL) VERIFICATION

## 2017-04-08 LAB — HEMOGLOBIN AND HEMATOCRIT, BLOOD
HCT: 24.6 % — ABNORMAL LOW (ref 36.0–46.0)
Hemoglobin: 8.5 g/dL — ABNORMAL LOW (ref 12.0–15.0)

## 2017-04-08 LAB — TSH: TSH: 2.44 u[IU]/mL (ref 0.350–4.500)

## 2017-04-08 MED ORDER — SODIUM CHLORIDE 0.9 % IV SOLN
1000.0000 mL | INTRAVENOUS | Status: DC
  Administered 2017-04-08 – 2017-04-10 (×2): 1000 mL via INTRAVENOUS

## 2017-04-08 MED ORDER — PEG 3350-KCL-NA BICARB-NACL 420 G PO SOLR
4000.0000 mL | Freq: Once | ORAL | Status: AC
  Administered 2017-04-08: 4000 mL via ORAL
  Filled 2017-04-08 (×2): qty 4000

## 2017-04-08 MED ORDER — POTASSIUM CHLORIDE CRYS ER 20 MEQ PO TBCR
60.0000 meq | EXTENDED_RELEASE_TABLET | Freq: Once | ORAL | Status: AC
  Administered 2017-04-08: 60 meq via ORAL
  Filled 2017-04-08: qty 3

## 2017-04-08 MED ORDER — SODIUM CHLORIDE 0.9 % IV SOLN: INTRAVENOUS | Status: DC

## 2017-04-08 NOTE — Progress Notes (Signed)
Physical Therapy Treatment Patient Details Name: Kayla Wyatt MRN: [id number] DOB: [birth year] Today's Date: 04/08/2017    History of Present Illness Pt presented with abdominal pain and diarrhea and found to have anemia due to acute GI bleed. PMH - HTN    PT Comments    Pt performed decreased gait during pm PT session due to pain in R LE.  BP pre tx 136/50, unable to obtain accurate reading of SPO2 due to poor waveform.  Pt on 2L [place].  Plan remains appropriate to return home.  Used brief for gait as diarrhea remains from GI bleed.      Follow Up Recommendations  No PT follow up     Equipment Recommendations  None recommended by PT    Recommendations for Other Services       Precautions / Restrictions Precautions Precautions: None Restrictions Weight Bearing Restrictions: No    Mobility  Bed Mobility Overal bed mobility: Needs Assistance Bed Mobility: Supine to Sit     Supine to sit: Min assist     General bed mobility comments: Pt required assistance for trunk elevation.    Transfers Overall transfer level: Needs assistance Equipment used: Rolling walker (2 wheeled) Transfers: Sit to/from Stand Sit to Stand: Min guard         General transfer comment: Cues for hand placement to and from seated surface.    Ambulation/Gait Ambulation/Gait assistance: Min guard Ambulation Distance (Feet): 120 Feet Assistive device: Rolling walker (2 wheeled) Gait Pattern/deviations: Step-through pattern;Decreased stance time - right;Decreased stride length;Antalgic Gait velocity: decr Gait velocity interpretation: Below normal speed for age/gender General Gait Details: Slightly guarded, antalgic gait due to RLE soreness. Used walker to decrease discomfort of RLE. Amb on 2L but poor waveform so unable to obtain accurate SPo2 reading.     Stairs            Wheelchair Mobility    Modified Rankin (Stroke Patients Only)       Balance Overall balance assessment: No  apparent balance deficits (not formally assessed)                                          Cognition Arousal/Alertness: Awake/alert Behavior During Therapy: WFL for tasks assessed/performed Overall Cognitive Status: Within Functional Limits for tasks assessed                                        Exercises      General Comments        Pertinent Vitals/Pain Pain Assessment: 0-10 Faces Pain Scale: Hurts little more Pain Location: rt leg Pain Descriptors / Indicators: Sore;Grimacing Pain Intervention(s): Monitored during session    Home Living                      Prior Function            PT Goals (current goals can now be found in the care plan section) Acute Rehab PT Goals Patient Stated Goal: return home Potential to Achieve Goals: Good Progress towards PT goals: Progressing toward goals    Frequency    Min 3X/week      PT Plan Current plan remains appropriate    Co-evaluation              AM-PAC  PT "6 Clicks" Daily Activity  Outcome Measure  Difficulty turning over in bed (including adjusting bedclothes, sheets and blankets)?: None Difficulty moving from lying on back to sitting on the side of the bed? : Unable Difficulty sitting down on and standing up from a chair with arms (e.g., wheelchair, bedside commode, etc,.)?: Unable Help needed moving to and from a bed to chair (including a wheelchair)?: A Little Help needed walking in hospital room?: A Little Help needed climbing 3-5 steps with a railing? : A Little 6 Click Score: 15    End of Session Equipment Utilized During Treatment: Gait belt Activity Tolerance: Patient tolerated treatment well Patient left: in bed;with call bell/phone within reach Nurse Communication: Mobility status PT Visit Diagnosis: Other abnormalities of gait and mobility (R26.89);Pain Pain - Right/Left: Right Pain - part of body: Leg     Time: 7711-6579 PT Time  Calculation (min) (ACUTE ONLY): 24 min  Charges:  $Gait Training: 8-22 mins $Therapeutic Activity: 8-22 mins                    G Codes:       Governor Rooks, PTA pager [phone]    Cristela Blue 04/08/2017, 4:40 PM

## 2017-04-08 NOTE — Progress Notes (Signed)
UNASSIGNED PATIENT  Subjective: Since I last evaluated the patient, she has ongoing rectal bleeding. Initially she was found to have a gastric ulcer with a visible vessel that was clipped. She got 2 units of packed red blood cells at that time. Over the last 24 hours she received PRBC's 3 units of packed red consult was had bloody bowel movements. She denies having any nausea vomiting,  fever chills or rigors. She had a bleeding scan done yesterday that was unrevealing with regards to source of bleeding/ A CTA of the abdomen revealed IMA stenosis with collaterals. She has developed a right lower extremity DVT and a CT angiogram of the chest reveals a 0.4 0.1 cm mass in the aortopulmonary window which is suspicious for neoplasm.  Objective: Vital signs in last 24 hours: Temp:  [97.8 F (36.6 C)-98.3 F (36.8 C)] 98.2 F (36.8 C) (01/30 1200) Pulse Rate:  [49-120] 54 (01/30 1200) Resp:  [13-33] 16 (01/30 1200) BP: (105-144)/(43-69) 119/43 (01/30 1200) SpO2:  [99 %-100 %] 100 % (01/30 1200) Weight:  [70.6 kg (155 lb 10.3 oz)] 70.6 kg (155 lb 10.3 oz) (01/30 0500) Last BM Date: 04/08/17  Intake/Output from previous day: 01/29 0701 - 01/30 0700 In: 963 [Blood:963] Out: 1 [Stool:1] Intake/Output this shift: Total I/O In: 472.4 [I.V.:472.4] Out: -   General appearance: alert, cooperative, appears stated age, icteric and pale Resp: clear to auscultation bilaterally Cardio: regular rate and rhythm, S1, S2 normal, no murmur, click, rub or gallop GI: soft, non-tender; bowel sounds normal; no masses,  no organomegaly Extremities: left extremity is normal, atraumatic, no cyanosis or edema; there is slight edema of the RLL.  Lab Results: Recent Labs    04/06/17 0211 04/07/17 1006 04/08/17 0248 04/08/17 1211  WBC 13.7* 18.5* 21.1*  --   HGB 8.1* 6.0* 8.8* 8.5*  HCT 24.3* 17.8* 24.6* 24.6*  PLT 262 238 149*  --    BMET Recent Labs    04/07/17 1006 04/08/17 0248  NA 144 144  K 3.3*  3.3*  CL 113* 114*  CO2 24 22  GLUCOSE 109* 113*  BUN 17 10  CREATININE 0.51 0.55  CALCIUM 7.7* 7.3*   LFT Recent Labs    04/05/17 1504  04/08/17 0248  PROT 5.2*   < > 4.0*  ALBUMIN 2.2*   < > 1.9*  AST 47*   < > 20  ALT 70*   < > 33  ALKPHOS 64   < > 42  BILITOT 0.5   < > 0.8  BILIDIR <0.1*  --   --   IBILI NOT CALCULATED  --   --    < > = values in this interval not displayed.   Studies/Results: Ct Angio Chest Pe W Or Wo Contrast  Result Date: 04/07/2017 CLINICAL DATA:  Shortness of breath and DVT. History of hypertension. Anemia. PE suspected, high pretest probability EXAM: CT ANGIOGRAPHY CHEST WITH CONTRAST TECHNIQUE: Multidetector CT imaging of the chest was performed using the standard protocol during bolus administration of intravenous contrast. Multiplanar CT image reconstructions and MIPs were obtained to evaluate the vascular anatomy. CONTRAST:  164mL ISOVUE-370 IOPAMIDOL (ISOVUE-370) INJECTION 76% COMPARISON:  Chest x-ray dated 04/03/2017. FINDINGS: Cardiovascular: Some of the most peripheral segmental and subsegmental pulmonary arteries are difficult to definitively characterize due to patient breathing motion artifact, however, there is no pulmonary embolism identified within the main, lobar or segmental pulmonary arteries bilaterally. No thoracic aortic aneurysm or evidence of dissection. Heart size is upper normal.  No pericardial effusion. Mediastinum/Nodes: Soft tissue mass within the aortopulmonary window region of the mediastinum, measuring 4.1 cm greatest dimension. Scattered small lymph nodes elsewhere within the mediastinum. Esophagus is unremarkable. Trachea and central bronchi are unremarkable. Lungs/Pleura: Small right pleural effusion. Mild bibasilar atelectasis. Mild emphysematous changes within the lung apices. Upper Abdomen: Limited images of the upper abdomen are unremarkable. Musculoskeletal: Mild degenerative spurring within the thoracic spine. No acute or  suspicious osseous finding. Review of the MIP images confirms the above findings. IMPRESSION: 1. 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 Recommend tissue sampling. 2. No pulmonary embolism seen, with mild study limitations detailed above. 3. Small right pleural effusion.  Mild bibasilar atelectasis. 4. Mild emphysematous changes within the lung apices. Emphysema (ICD10-J43.9). These results will be called to the ordering clinician or representative by the Radiologist Assistant, and communication documented in the PACS or zVision Dashboard. Electronically Signed   By: Franki Cabot M.D.   On: 04/07/2017 23:26   Nm Gi Blood Loss  Result Date: 04/07/2017 CLINICAL DATA:  62 year old with acute onset of gastrointestinal bleeding approximately 12 hours ago. Technologist states the patient had 2 episodes of passage of dark red blood during the examination. EXAM: NUCLEAR MEDICINE GASTROINTESTINAL BLEEDING SCAN TECHNIQUE: Sequential abdominal images out to 2 hours were obtained following intravenous administration of Tc-4m labeled red blood cells. RADIOPHARMACEUTICALS:  25.1 mCi Tc-11m pertechnetate in-vitro labeled red cells. COMPARISON:  No prior nuclear imaging. CTA abdomen pelvis 04/03/2017 is correlated. FINDINGS: No abnormal activity in the abdomen or pelvis to be able to identify an active source of bleeding. Expected blood pool activity and hepatic and splenic activity is present. Expected excretion into the urinary tract with faint visualization of the bladder in the pelvis. IMPRESSION: No evidence of active gastrointestinal bleeding. Electronically Signed   By: Evangeline Dakin M.D.   On: 04/07/2017 20:52   Medications: I have reviewed the patient's current medications.  Assessment/Plan: 1) Acute posthemorrhagic anemia requiring blood transfusions. Patient has received 5 units of packed red blood cells was admission  gastric ulcer with a visible vessel clip during EGD and colonoscopy scheduled for tomorrow will prep tonight.  2) Paroxysmal A. Fib with rapid ventricular response-currently rate controlled.  3) Right ower extremity DVT.  4) E. Coli bacteremia, ?UTI on Rocephin.  5) 4.1 cm mass in the aortopulmonary window will need workup.  6) Protein calorie malnutrition.  7) HTN/Hyperlipidemia.  LOS: 6 days   Zavia Pullen 04/08/2017, 1:41 PM

## 2017-04-08 NOTE — Progress Notes (Signed)
Patient ID: Kayla Wyatt, female   DOB: 01-Jul-1955, 62 y.o.   MRN: [id number]  PROGRESS NOTE    Kayla Wyatt  FTD:[id number] DOB: 08-21-1955 DOA: 04/02/2017 PCP: [name], MD   Brief Narrative:  62 year old female with history of hypertension and hyperlipidemia presented on 04/02/2017 with abdominal pain and diarrhea.  Her hemoglobin was 6.4.  She was transfused packed red cells.  GI was consulted.  EGD showed gastric ulcer with visible vessel that was clipped.  Patient developed new onset A. fib with RVR for which she was initially put on Cardizem drip which was switched to amiodarone drip.  Cardiology was consulted.  Patient was found to have right lower extremity DVT.  Patient is still having intermittent rectal bleed.  Assessment & Plan:   Principal Problem:   Gastrointestinal hemorrhage with melena Active Problems:   Acute blood loss anemia   Lower abdominal pain of unknown etiology   Abnormal computed tomography angiography (CTA) of abdomen and pelvis   Persistent atrial fibrillation with rapid ventricular response (HCC)   Acute blood loss anemia in the setting of gastric ulcer/upper GI bleed along with probable lower GI bleed as well -Status post 2 units packed red cells transfusion on admission.   -Status post 3 units packed red cells admission yesterday through today.   -Hemoglobin 8.8 this morning.   -Patient is still having intermittent bloody bowel movements.   -Bleeding scan on 04/07/2017 was negative for active bleeding.   -Spoke to Dr. [name] on phone and he plans to do colonoscopy tomorrow.  Spoke to Dr. Stanford Breed and he is okay for the patient to proceed with colonoscopy tomorrow  -Continue monitoring H&H -EGD done 04/03/17 revealed: A single visible vessel in the setting of a mild ulceration was found in the stomach. Clipped. -CTA abdomen: stenosis of IMA with collaterals -Continue Protonix drip  Paroxysmal Afib with RVR  -Currently on amiodarone drip.  Cardiology  following -Cannot anticoagulate at this moment because of GI bleed -Currently rate controlled.  Continue metoprolol  Newly diagnosed RLE DVT  -not anticoagulated due to active GI bleed -CTA chest is negative for PE. 2D echo 04/06/17 LVEF 06-23% (grade 2 diastolic dysfunction) -May consider IVC filter if patient continues to bleed and cannot tolerate anticoagulation.  E-coli bacteremia - probably from UTI -Continue Rocephin  Hypokalemia -Replace -BMP am   UTI -Continue antibiotics as above  Hypertension -BP stable -Continue metoprolol  Hyperlipidemia -Continue statin  Moderate protein calorie malnutrition -encourage to increase po intake  Acute Transaminitis -Resolved.  Leukocytosis -Probably reactive.  Monitor   DVT prophylaxis: None due to active GI bleeding and RLE DVT Code Status: Full Family Communication: Spoke to brother at bedside Disposition Plan: Depends on clinical outcome  Consultants:  GI/cardiology/gynecology  Procedures:  EGD on 04/03/2017 Impression:               - Normal esophagus.                           - A single visible vessel in the setting of a mild                            ulceration was found in the stomach. Clip (MR                            unsafe) was placed.                           -  Normal examined duodenum.                           - No specimens collected.  -2D echo 04/06/17 LVEF 27-78% (grade 2 diastolic dysfunction)  Antimicrobials:  Anti-infectives (From admission, onward)   Start     Dose/Rate Route Frequency Ordered Stop   04/04/17 1500  cefTRIAXone (ROCEPHIN) 2 g in dextrose 5 % 50 mL IVPB     2 g 100 mL/hr over 30 Minutes Intravenous Every 24 hours 04/04/17 1307     04/03/17 0400  vancomycin (VANCOCIN) 500 mg in sodium chloride 0.9 % 100 mL IVPB  Status:  Discontinued     500 mg 100 mL/hr over 60 Minutes Intravenous Every 12 hours 04/02/17 1630 04/04/17 1307   04/02/17 2200  piperacillin-tazobactam  (ZOSYN) IVPB 3.375 g  Status:  Discontinued     3.375 g 12.5 mL/hr over 240 Minutes Intravenous Every 8 hours 04/02/17 1630 04/04/17 1307   04/02/17 1600  piperacillin-tazobactam (ZOSYN) IVPB 3.375 g     3.375 g 100 mL/hr over 30 Minutes Intravenous  Once 04/02/17 1546 04/02/17 1645   04/02/17 1600  vancomycin (VANCOCIN) IVPB 1000 mg/200 mL premix     1,000 mg 200 mL/hr over 60 Minutes Intravenous  Once 04/02/17 1546 04/02/17 1711       Subjective: Patient seen and examined at bedside.  She is still having intermittent rectal bleeding.  No overnight fever or vomiting.  Objective: Vitals:   04/08/17 0234 04/08/17 0400 04/08/17 0500 04/08/17 0729  BP: (!) 125/54 (!) 121/52  (!) 131/49  Pulse: 79 (!) 49  64  Resp: (!) 23 (!) 25  (!) 23  Temp: 98.1 F (36.7 C) 98 F (36.7 C)  97.9 F (36.6 C)  TempSrc: Oral Oral  Oral  SpO2: 99% 100%  99%  Weight:   70.6 kg (155 lb 10.3 oz)   Height:        Intake/Output Summary (Last 24 hours) at 04/08/2017 0943 Last data filed at 04/08/2017 0030 Gross per 24 hour  Intake 963 ml  Output 1 ml  Net 962 ml   Filed Weights   04/06/17 0357 04/07/17 0537 04/08/17 0500  Weight: 65.8 kg (145 lb 1 oz) 67.2 kg (148 lb 2.4 oz) 70.6 kg (155 lb 10.3 oz)    Examination:  General exam: Appears calm and comfortable  Respiratory system: Bilateral decreased breath sound at bases Cardiovascular system: S1 & S2 heard, rate controlled  gastrointestinal system: Abdomen is nondistended, soft and nontender. Normal bowel sounds heard. Extremities: No cyanosis, clubbing; right lower extremity mildly tender and mildly edematous   Data Reviewed: I have personally reviewed following labs and imaging studies  CBC: Recent Labs  Lab 04/04/17 0654 04/05/17 0447 04/06/17 0211 04/07/17 1006 04/08/17 0248  WBC 8.8 10.4 13.7* 18.5* 21.1*  HGB 8.1* 7.9* 8.1* 6.0* 8.8*  HCT 23.5* 23.8* 24.3* 17.8* 24.6*  MCV 87.4 89.8 91.0 92.2 90.1  PLT 242 228 262 238 149*     Basic Metabolic Panel: Recent Labs  Lab 04/03/17 0409 04/04/17 0654 04/05/17 0447 04/07/17 1006 04/08/17 0248  NA 142 147* 145 144 144  K 3.3* 3.1* 4.0 3.3* 3.3*  CL 111 114* 115* 113* 114*  CO2 22 24 21* 24 22  GLUCOSE 103* 108* 104* 109* 113*  BUN 73* 19 12 17 10   CREATININE 0.74 0.69 0.60 0.51 0.55  CALCIUM 7.9* 8.2* 7.9* 7.7* 7.3*  MG 2.3  --   --   --   --  PHOS 2.3*  --   --   --   --    GFR: Estimated Creatinine Clearance: 67 mL/min (by C-G formula based on SCr of 0.55 mg/dL). Liver Function Tests: Recent Labs  Lab 04/02/17 1459 04/05/17 1504 04/07/17 1006 04/08/17 0248  AST 106* 47* 24 20  ALT 162* 70* 40 33  ALKPHOS 118 64 51 42  BILITOT 0.7 0.5 0.3 0.8  PROT 5.2* 5.2* 4.4* 4.0*  ALBUMIN 2.1* 2.2* 1.9* 1.9*   Recent Labs  Lab 04/02/17 1459  LIPASE 17   No results for input(s): AMMONIA in the last 168 hours. Coagulation Profile: Recent Labs  Lab 04/02/17 1459 04/03/17 0409  INR 1.14 1.10   Cardiac Enzymes: No results for input(s): CKTOTAL, CKMB, CKMBINDEX, TROPONINI in the last 168 hours. BNP (last 3 results) No results for input(s): PROBNP in the last 8760 hours. HbA1C: No results for input(s): HGBA1C in the last 72 hours. CBG: No results for input(s): GLUCAP in the last 168 hours. Lipid Profile: No results for input(s): CHOL, HDL, LDLCALC, TRIG, CHOLHDL, LDLDIRECT in the last 72 hours. Thyroid Function Tests: Recent Labs    04/08/17 0248  TSH 2.440   Anemia Panel: No results for input(s): VITAMINB12, FOLATE, FERRITIN, TIBC, IRON, RETICCTPCT in the last 72 hours. Sepsis Labs: Recent Labs  Lab 04/02/17 1518 04/02/17 1939 04/02/17 2128 04/03/17 0409 04/06/17 0914  PROCALCITON  --   --   --   --  0.13  LATICACIDVEN 7.98* 1.05 1.2 0.8  --     Recent Results (from the past 240 hour(s))  Culture, blood (Routine x 2)     Status: Abnormal   Collection Time: 04/02/17  3:40 PM  Result Value Ref Range Status   Specimen Description  BLOOD RIGHT FOREARM  Final   Special Requests   Final    BOTTLES DRAWN AEROBIC AND ANAEROBIC Blood Culture adequate volume   Culture  Setup Time   Final    GRAM NEGATIVE RODS AEROBIC BOTTLE ONLY CRITICAL RESULT CALLED TO, READ BACK BY AND VERIFIED WITH: JCARNEY,PHARMD @ 2219 04/03/17 BY LHOWARD    Culture ESCHERICHIA COLI (A)  Final   Report Status 04/05/2017 FINAL  Final   Organism ID, Bacteria ESCHERICHIA COLI  Final      Susceptibility   Escherichia coli - MIC*    AMPICILLIN <=2 SENSITIVE Sensitive     CEFAZOLIN <=4 SENSITIVE Sensitive     CEFEPIME <=1 SENSITIVE Sensitive     CEFTAZIDIME <=1 SENSITIVE Sensitive     CEFTRIAXONE <=1 SENSITIVE Sensitive     CIPROFLOXACIN <=0.25 SENSITIVE Sensitive     GENTAMICIN <=1 SENSITIVE Sensitive     IMIPENEM <=0.25 SENSITIVE Sensitive     TRIMETH/SULFA <=20 SENSITIVE Sensitive     AMPICILLIN/SULBACTAM <=2 SENSITIVE Sensitive     PIP/TAZO <=4 SENSITIVE Sensitive     Extended ESBL NEGATIVE Sensitive     * ESCHERICHIA COLI  Blood Culture ID Panel (Reflexed)     Status: Abnormal   Collection Time: 04/02/17  3:40 PM  Result Value Ref Range Status   Enterococcus species NOT DETECTED NOT DETECTED Final   Listeria monocytogenes NOT DETECTED NOT DETECTED Final   Staphylococcus species NOT DETECTED NOT DETECTED Final   Staphylococcus aureus NOT DETECTED NOT DETECTED Final   Streptococcus species NOT DETECTED NOT DETECTED Final   Streptococcus agalactiae NOT DETECTED NOT DETECTED Final   Streptococcus pneumoniae NOT DETECTED NOT DETECTED Final   Streptococcus pyogenes NOT DETECTED NOT DETECTED Final  Acinetobacter baumannii NOT DETECTED NOT DETECTED Final   Enterobacteriaceae species DETECTED (A) NOT DETECTED Final    Comment: Enterobacteriaceae represent a large family of gram-negative bacteria, not a single organism. CRITICAL RESULT CALLED TO, READ BACK BY AND VERIFIED WITH: JCARNEY,PHARMD @2219  04/03/17 BY LHOWARD    Enterobacter cloacae  complex NOT DETECTED NOT DETECTED Final   Escherichia coli DETECTED (A) NOT DETECTED Final    Comment: CRITICAL RESULT CALLED TO, READ BACK BY AND VERIFIED WITH: JCARNEY,PHARMD @2219  04/03/17 BY LHOWARD    Klebsiella oxytoca NOT DETECTED NOT DETECTED Final   Klebsiella pneumoniae NOT DETECTED NOT DETECTED Final   Proteus species NOT DETECTED NOT DETECTED Final   Serratia marcescens NOT DETECTED NOT DETECTED Final   Carbapenem resistance NOT DETECTED NOT DETECTED Final   Haemophilus influenzae NOT DETECTED NOT DETECTED Final   Neisseria meningitidis NOT DETECTED NOT DETECTED Final   Pseudomonas aeruginosa NOT DETECTED NOT DETECTED Final   Candida albicans NOT DETECTED NOT DETECTED Final   Candida glabrata NOT DETECTED NOT DETECTED Final   Candida krusei NOT DETECTED NOT DETECTED Final   Candida parapsilosis NOT DETECTED NOT DETECTED Final   Candida tropicalis NOT DETECTED NOT DETECTED Final  Culture, blood (Routine x 2)     Status: None   Collection Time: 04/02/17  3:44 PM  Result Value Ref Range Status   Specimen Description BLOOD RIGHT FOREARM  Final   Special Requests   Final    BOTTLES DRAWN AEROBIC AND ANAEROBIC Blood Culture adequate volume   Culture NO GROWTH 5 DAYS  Final   Report Status 04/07/2017 FINAL  Final  Urine culture     Status: Abnormal   Collection Time: 04/02/17  5:05 PM  Result Value Ref Range Status   Specimen Description URINE, RANDOM  Final   Special Requests NONE  Final   Culture MULTIPLE SPECIES PRESENT, SUGGEST RECOLLECTION (A)  Final   Report Status 04/03/2017 FINAL  Final  MRSA PCR Screening     Status: None   Collection Time: 04/02/17  8:43 PM  Result Value Ref Range Status   MRSA by PCR NEGATIVE NEGATIVE Final    Comment:        The GeneXpert MRSA Assay (FDA approved for NASAL specimens only), is one component of a comprehensive MRSA colonization surveillance program. It is not intended to diagnose MRSA infection nor to guide or monitor  treatment for MRSA infections.   Culture, blood (routine x 2)     Status: None (Preliminary result)   Collection Time: 04/04/17  3:16 PM  Result Value Ref Range Status   Specimen Description BLOOD LEFT ANTECUBITAL  Final   Special Requests IN PEDIATRIC BOTTLE Blood Culture adequate volume  Final   Culture NO GROWTH 3 DAYS  Final   Report Status PENDING  Incomplete  Culture, blood (routine x 2)     Status: None (Preliminary result)   Collection Time: 04/04/17  3:19 PM  Result Value Ref Range Status   Specimen Description BLOOD LEFT HAND  Final   Special Requests IN PEDIATRIC BOTTLE Blood Culture adequate volume  Final   Culture NO GROWTH 3 DAYS  Final   Report Status PENDING  Incomplete  Culture, Urine     Status: None   Collection Time: 04/05/17 12:19 PM  Result Value Ref Range Status   Specimen Description URINE, RANDOM  Final   Special Requests NONE  Final   Culture NO GROWTH  Final   Report Status 04/06/2017 FINAL  Final  Radiology Studies: Ct Angio Chest Pe W Or Wo Contrast  Result Date: 04/07/2017 CLINICAL DATA:  Shortness of breath and DVT. History of hypertension. Anemia. PE suspected, high pretest probability EXAM: CT ANGIOGRAPHY CHEST WITH CONTRAST TECHNIQUE: Multidetector CT imaging of the chest was performed using the standard protocol during bolus administration of intravenous contrast. Multiplanar CT image reconstructions and MIPs were obtained to evaluate the vascular anatomy. CONTRAST:  165mL ISOVUE-370 IOPAMIDOL (ISOVUE-370) INJECTION 76% COMPARISON:  Chest x-ray dated 04/03/2017. FINDINGS: Cardiovascular: Some of the most peripheral segmental and subsegmental pulmonary arteries are difficult to definitively characterize due to patient breathing motion artifact, however, there is no pulmonary embolism identified within the main, lobar or segmental pulmonary arteries bilaterally. No thoracic aortic aneurysm or evidence of dissection. Heart size is upper normal.  No pericardial effusion. Mediastinum/Nodes: Soft tissue mass within the aortopulmonary window region of the mediastinum, measuring 4.1 cm greatest dimension. Scattered small lymph nodes elsewhere within the mediastinum. Esophagus is unremarkable. Trachea and central bronchi are unremarkable. Lungs/Pleura: Small right pleural effusion. Mild bibasilar atelectasis. Mild emphysematous changes within the lung apices. Upper Abdomen: Limited images of the upper abdomen are unremarkable. Musculoskeletal: Mild degenerative spurring within the thoracic spine. No acute or suspicious osseous finding. Review of the MIP images confirms the above findings. IMPRESSION: 1. 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 Recommend tissue sampling. 2. No pulmonary embolism seen, with mild study limitations detailed above. 3. Small right pleural effusion.  Mild bibasilar atelectasis. 4. Mild emphysematous changes within the lung apices. Emphysema (ICD10-J43.9). These results will be called to the ordering clinician or representative by the Radiologist Assistant, and communication documented in the PACS or zVision Dashboard. Electronically Signed   By: Franki Cabot M.D.   On: 04/07/2017 23:26   Nm Gi Blood Loss  Result Date: 04/07/2017 CLINICAL DATA:  62 year old with acute onset of gastrointestinal bleeding approximately 12 hours ago. Technologist states the patient had 2 episodes of passage of dark red blood during the examination. EXAM: NUCLEAR MEDICINE GASTROINTESTINAL BLEEDING SCAN TECHNIQUE: Sequential abdominal images out to 2 hours were obtained following intravenous administration of Tc-93m labeled red blood cells. RADIOPHARMACEUTICALS:  25.1 mCi Tc-60m pertechnetate in-vitro labeled red cells. COMPARISON:  No prior nuclear imaging. CTA abdomen pelvis 04/03/2017 is correlated. FINDINGS: No abnormal activity in the abdomen or pelvis to be able to  identify an active source of bleeding. Expected blood pool activity and hepatic and splenic activity is present. Expected excretion into the urinary tract with faint visualization of the bladder in the pelvis. IMPRESSION: No evidence of active gastrointestinal bleeding. Electronically Signed   By: Evangeline Dakin M.D.   On: 04/07/2017 20:52        Scheduled Meds: . feeding supplement (ENSURE ENLIVE)  237 mL Oral BID BM  . metoprolol tartrate  12.5 mg Oral BID  . [START ON 04/11/2017] pantoprazole  40 mg Intravenous Q12H  . simvastatin  20 mg Oral Daily  . sodium chloride flush  3 mL Intravenous Q12H   Continuous Infusions: . sodium chloride 1,000 mL (04/08/17 0040)  . amiodarone 30 mg/hr (04/07/17 2209)  . cefTRIAXone (ROCEPHIN)  IV Stopped (04/07/17 1433)  . diltiazem (CARDIZEM) infusion Stopped (04/07/17 1224)  . pantoprozole (PROTONIX) infusion 8 mg/hr (04/08/17 0427)     LOS: 6 days        Aline August, MD [facility] Pager [phone]  If 7PM-7AM, please contact night-coverage [email or web address] Password [facility] 04/08/2017, 9:43 AM

## 2017-04-08 NOTE — H&P (View-Only) (Signed)
UNASSIGNED PATIENT  Subjective: Since I last evaluated the patient, she has ongoing rectal bleeding. Initially she was found to have a gastric ulcer with a visible vessel that was clipped. She got 2 units of packed red blood cells at that time. Over the last 24 hours she received PRBC's 3 units of packed red consult was had bloody bowel movements. She denies having any nausea vomiting,  fever chills or rigors. She had a bleeding scan done yesterday that was unrevealing with regards to source of bleeding/ A CTA of the abdomen revealed IMA stenosis with collaterals. She has developed a right lower extremity DVT and a CT angiogram of the chest reveals a 0.4 0.1 cm mass in the aortopulmonary window which is suspicious for neoplasm.  Objective: Vital signs in last 24 hours: Temp:  [97.8 F (36.6 C)-98.3 F (36.8 C)] 98.2 F (36.8 C) (01/30 1200) Pulse Rate:  [49-120] 54 (01/30 1200) Resp:  [13-33] 16 (01/30 1200) BP: (105-144)/(43-69) 119/43 (01/30 1200) SpO2:  [99 %-100 %] 100 % (01/30 1200) Weight:  [70.6 kg (155 lb 10.3 oz)] 70.6 kg (155 lb 10.3 oz) (01/30 0500) Last BM Date: 04/08/17  Intake/Output from previous day: 01/29 0701 - 01/30 0700 In: 963 [Blood:963] Out: 1 [Stool:1] Intake/Output this shift: Total I/O In: 472.4 [I.V.:472.4] Out: -   General appearance: alert, cooperative, appears stated age, icteric and pale Resp: clear to auscultation bilaterally Cardio: regular rate and rhythm, S1, S2 normal, no murmur, click, rub or gallop GI: soft, non-tender; bowel sounds normal; no masses,  no organomegaly Extremities: left extremity is normal, atraumatic, no cyanosis or edema; there is slight edema of the RLL.  Lab Results: Recent Labs    04/06/17 0211 04/07/17 1006 04/08/17 0248 04/08/17 1211  WBC 13.7* 18.5* 21.1*  --   HGB 8.1* 6.0* 8.8* 8.5*  HCT 24.3* 17.8* 24.6* 24.6*  PLT 262 238 149*  --    BMET Recent Labs    04/07/17 1006 04/08/17 0248  NA 144 144  K 3.3*  3.3*  CL 113* 114*  CO2 24 22  GLUCOSE 109* 113*  BUN 17 10  CREATININE 0.51 0.55  CALCIUM 7.7* 7.3*   LFT Recent Labs    04/05/17 1504  04/08/17 0248  PROT 5.2*   < > 4.0*  ALBUMIN 2.2*   < > 1.9*  AST 47*   < > 20  ALT 70*   < > 33  ALKPHOS 64   < > 42  BILITOT 0.5   < > 0.8  BILIDIR <0.1*  --   --   IBILI NOT CALCULATED  --   --    < > = values in this interval not displayed.   Studies/Results: Ct Angio Chest Pe W Or Wo Contrast  Result Date: 04/07/2017 CLINICAL DATA:  Shortness of breath and DVT. History of hypertension. Anemia. PE suspected, high pretest probability EXAM: CT ANGIOGRAPHY CHEST WITH CONTRAST TECHNIQUE: Multidetector CT imaging of the chest was performed using the standard protocol during bolus administration of intravenous contrast. Multiplanar CT image reconstructions and MIPs were obtained to evaluate the vascular anatomy. CONTRAST:  134mL ISOVUE-370 IOPAMIDOL (ISOVUE-370) INJECTION 76% COMPARISON:  Chest x-ray dated 04/03/2017. FINDINGS: Cardiovascular: Some of the most peripheral segmental and subsegmental pulmonary arteries are difficult to definitively characterize due to patient breathing motion artifact, however, there is no pulmonary embolism identified within the main, lobar or segmental pulmonary arteries bilaterally. No thoracic aortic aneurysm or evidence of dissection. Heart size is upper normal.  No pericardial effusion. Mediastinum/Nodes: Soft tissue mass within the aortopulmonary window region of the mediastinum, measuring 4.1 cm greatest dimension. Scattered small lymph nodes elsewhere within the mediastinum. Esophagus is unremarkable. Trachea and central bronchi are unremarkable. Lungs/Pleura: Small right pleural effusion. Mild bibasilar atelectasis. Mild emphysematous changes within the lung apices. Upper Abdomen: Limited images of the upper abdomen are unremarkable. Musculoskeletal: Mild degenerative spurring within the thoracic spine. No acute or  suspicious osseous finding. Review of the MIP images confirms the above findings. IMPRESSION: 1. 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 Recommend tissue sampling. 2. No pulmonary embolism seen, with mild study limitations detailed above. 3. Small right pleural effusion.  Mild bibasilar atelectasis. 4. Mild emphysematous changes within the lung apices. Emphysema (ICD10-J43.9). These results will be called to the ordering clinician or representative by the Radiologist Assistant, and communication documented in the PACS or zVision Dashboard. Electronically Signed   By: Franki Cabot M.D.   On: 04/07/2017 23:26   Nm Gi Blood Loss  Result Date: 04/07/2017 CLINICAL DATA:  62 year old with acute onset of gastrointestinal bleeding approximately 12 hours ago. Technologist states the patient had 2 episodes of passage of dark red blood during the examination. EXAM: NUCLEAR MEDICINE GASTROINTESTINAL BLEEDING SCAN TECHNIQUE: Sequential abdominal images out to 2 hours were obtained following intravenous administration of Tc-36m labeled red blood cells. RADIOPHARMACEUTICALS:  25.1 mCi Tc-71m pertechnetate in-vitro labeled red cells. COMPARISON:  No prior nuclear imaging. CTA abdomen pelvis 04/03/2017 is correlated. FINDINGS: No abnormal activity in the abdomen or pelvis to be able to identify an active source of bleeding. Expected blood pool activity and hepatic and splenic activity is present. Expected excretion into the urinary tract with faint visualization of the bladder in the pelvis. IMPRESSION: No evidence of active gastrointestinal bleeding. Electronically Signed   By: Evangeline Dakin M.D.   On: 04/07/2017 20:52   Medications: I have reviewed the patient's current medications.  Assessment/Plan: 1) Acute posthemorrhagic anemia requiring blood transfusions. Patient has received 5 units of packed red blood cells was admission  gastric ulcer with a visible vessel clip during EGD and colonoscopy scheduled for tomorrow will prep tonight.  2) Paroxysmal A. Fib with rapid ventricular response-currently rate controlled.  3) Right ower extremity DVT.  4) E. Coli bacteremia, ?UTI on Rocephin.  5) 4.1 cm mass in the aortopulmonary window will need workup.  6) Protein calorie malnutrition.  7) HTN/Hyperlipidemia.  LOS: 6 days   Venola Castello 04/08/2017, 1:41 PM

## 2017-04-08 NOTE — Progress Notes (Signed)
Progress Note  Patient Name: Kayla Wyatt Date of Encounter: 04/08/2017  Primary Cardiologist: Dr Stanford Breed  Subjective   No chest pain or dyspnea; no abdominal pain  Inpatient Medications    Scheduled Meds: . feeding supplement (ENSURE ENLIVE)  237 mL Oral BID BM  . metoprolol tartrate  12.5 mg Oral BID  . [START ON 04/11/2017] pantoprazole  40 mg Intravenous Q12H  . simvastatin  20 mg Oral Daily  . sodium chloride flush  3 mL Intravenous Q12H   Continuous Infusions: . sodium chloride 1,000 mL (04/08/17 0040)  . amiodarone 30 mg/hr (04/07/17 2209)  . cefTRIAXone (ROCEPHIN)  IV Stopped (04/07/17 1433)  . diltiazem (CARDIZEM) infusion Stopped (04/07/17 1224)  . pantoprozole (PROTONIX) infusion 8 mg/hr (04/08/17 0427)   PRN Meds: acetaminophen **OR** acetaminophen, nicotine, ondansetron **OR** ondansetron (ZOFRAN) IV   Vital Signs    Vitals:   04/08/17 0234 04/08/17 0400 04/08/17 0500 04/08/17 0729  BP: (!) 125/54 (!) 121/52  (!) 131/49  Pulse: 79 (!) 49  64  Resp: (!) 23 (!) 25  (!) 23  Temp: 98.1 F (36.7 C) 98 F (36.7 C)  97.9 F (36.6 C)  TempSrc: Oral Oral  Oral  SpO2: 99% 100%  99%  Weight:   155 lb 10.3 oz (70.6 kg)   Height:        Intake/Output Summary (Last 24 hours) at 04/08/2017 0743 Last data filed at 04/08/2017 0030 Gross per 24 hour  Intake 963 ml  Output 1 ml  Net 962 ml   Filed Weights   04/06/17 0357 04/07/17 0537 04/08/17 0500  Weight: 145 lb 1 oz (65.8 kg) 148 lb 2.4 oz (67.2 kg) 155 lb 10.3 oz (70.6 kg)    Telemetry    Atrial fibrillation converting to sinus - Personally Reviewed   Physical Exam   GEN: No acute distress.   Neck: No JVD Cardiac: RRR, no murmurs, rubs, or gallops.  Respiratory: Clear to auscultation bilaterally. GI: Soft, nontender, non-distended  MS: No edema; 1+ edema RLE Neuro:  Nonfocal  Psych: Normal affect   Labs    Chemistry Recent Labs  Lab 04/05/17 0447 04/05/17 1504 04/07/17 1006  04/08/17 0248  NA 145  --  144 144  K 4.0  --  3.3* 3.3*  CL 115*  --  113* 114*  CO2 21*  --  24 22  GLUCOSE 104*  --  109* 113*  BUN 12  --  17 10  CREATININE 0.60  --  0.51 0.55  CALCIUM 7.9*  --  7.7* 7.3*  PROT  --  5.2* 4.4* 4.0*  ALBUMIN  --  2.2* 1.9* 1.9*  AST  --  47* 24 20  ALT  --  70* 40 33  ALKPHOS  --  64 51 42  BILITOT  --  0.5 0.3 0.8  GFRNONAA >60  --  >60 >60  GFRAA >60  --  >60 >60  ANIONGAP 9  --  7 8     Hematology Recent Labs  Lab 04/06/17 0211 04/07/17 1006 04/08/17 0248  WBC 13.7* 18.5* 21.1*  RBC 2.67* 1.93* 2.73*  HGB 8.1* 6.0* 8.8*  HCT 24.3* 17.8* 24.6*  MCV 91.0 92.2 90.1  MCH 30.3 31.1 32.2  MCHC 33.3 33.7 35.8  RDW 15.2 15.5 14.3  PLT 262 238 149*     Recent Labs  Lab 04/02/17 1557  TROPIPOC 0.01      Radiology    Ct Angio Chest Pe W Or  Wo Contrast  Result Date: 04/07/2017 CLINICAL DATA:  Shortness of breath and DVT. History of hypertension. Anemia. PE suspected, high pretest probability EXAM: CT ANGIOGRAPHY CHEST WITH CONTRAST TECHNIQUE: Multidetector CT imaging of the chest was performed using the standard protocol during bolus administration of intravenous contrast. Multiplanar CT image reconstructions and MIPs were obtained to evaluate the vascular anatomy. CONTRAST:  180mL ISOVUE-370 IOPAMIDOL (ISOVUE-370) INJECTION 76% COMPARISON:  Chest x-ray dated 04/03/2017. FINDINGS: Cardiovascular: Some of the most peripheral segmental and subsegmental pulmonary arteries are difficult to definitively characterize due to patient breathing motion artifact, however, there is no pulmonary embolism identified within the main, lobar or segmental pulmonary arteries bilaterally. No thoracic aortic aneurysm or evidence of dissection. Heart size is upper normal. No pericardial effusion. Mediastinum/Nodes: Soft tissue mass within the aortopulmonary window region of the mediastinum, measuring 4.1 cm greatest dimension. Scattered small lymph nodes  elsewhere within the mediastinum. Esophagus is unremarkable. Trachea and central bronchi are unremarkable. Lungs/Pleura: Small right pleural effusion. Mild bibasilar atelectasis. Mild emphysematous changes within the lung apices. Upper Abdomen: Limited images of the upper abdomen are unremarkable. Musculoskeletal: Mild degenerative spurring within the thoracic spine. No acute or suspicious osseous finding. Review of the MIP images confirms the above findings. IMPRESSION: 1. 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 Recommend tissue sampling. 2. No pulmonary embolism seen, with mild study limitations detailed above. 3. Small right pleural effusion.  Mild bibasilar atelectasis. 4. Mild emphysematous changes within the lung apices. Emphysema (ICD10-J43.9). These results will be called to the ordering clinician or representative by the Radiologist Assistant, and communication documented in the PACS or zVision Dashboard. Electronically Signed   By: Franki Cabot M.D.   On: 04/07/2017 23:26   Nm Gi Blood Loss  Result Date: 04/07/2017 CLINICAL DATA:  62 year old with acute onset of gastrointestinal bleeding approximately 12 hours ago. Technologist states the patient had 2 episodes of passage of dark red blood during the examination. EXAM: NUCLEAR MEDICINE GASTROINTESTINAL BLEEDING SCAN TECHNIQUE: Sequential abdominal images out to 2 hours were obtained following intravenous administration of Tc-8m labeled red blood cells. RADIOPHARMACEUTICALS:  25.1 mCi Tc-24m pertechnetate in-vitro labeled red cells. COMPARISON:  No prior nuclear imaging. CTA abdomen pelvis 04/03/2017 is correlated. FINDINGS: No abnormal activity in the abdomen or pelvis to be able to identify an active source of bleeding. Expected blood pool activity and hepatic and splenic activity is present. Expected excretion into the urinary tract with faint visualization of the  bladder in the pelvis. IMPRESSION: No evidence of active gastrointestinal bleeding. Electronically Signed   By: Evangeline Dakin M.D.   On: 04/07/2017 20:52    Patient Profile     62 year old female with past medical history of hypertension and hyperlipidemia admitted with a GI bleed for evaluation of paroxysmal atrial fibrillation.  Patient admitted on January 24 with GI bleed.  EGD showed gastric ulcer which was clipped.  She has been noted to have paroxysmal atrial fibrillation on telemetry and cardiology asked to evaluate. Echo shows normal LV function, mild LAE and mild mitral regurgitation.    Assessment & Plan    1 paroxysmal atrial fibrillation-patient in sinus this AM; continue amiodarone; cannot anticoagulate given GI bleed. We will consider in the future when GI bleed improves. TSH normal.  LV function is normal.  2 GI bleed-follow CBC; management per IM and GI.  3 vascular disease-noted on abdominal CTA.  This will be followed up as an outpatient.  4 DVT-pt with gastrocnemius DVT; no proximal DVT; will need to follow closely as pt has active GI bleed and cannot be anticoagulated, remains fairly immobile and has possible malignancy on chest CT increasing risk of hypercoaguable state; would consider IVC filter; will leave to primary care.  5 Mediastinal mass-further wu per primary care.  For questions or updates, please contact [facility] Please consult [email or web address] for contact info under Cardiology/STEMI.      Signed, Kirk Ruths, MD  04/08/2017, 7:43 AM

## 2017-04-08 NOTE — Progress Notes (Signed)
Pt has received 3 PRBC infusions over past 24 hours. Last hgb 8.8 with one more infusion left in order set to be released. Will hold off on last infusion as instructed by K. Schorr pending further H&H results. Patient has not had a bloody stool in a number of hours and remains NPO. Will continue to monitor.

## 2017-04-08 NOTE — Progress Notes (Signed)
    Rec'd a call from RN that pt HR had been high 40s and 50s, they had stopped the amiodarone.  Spoke w/ Dr Stanford Breed, he prefers to keep the amio going as long as pt in SR, HR > 45 and no sx from the low HR.   Tele reviewed, pt in SR now, HR 60s, previously sinus brady to high 40s.  Will update amio order and let the RN know.   Rosaria Ferries, PA-C 04/08/2017 10:34 AM Beeper [phone]

## 2017-04-08 NOTE — Telephone Encounter (Signed)
Called and notified patient of appointment with Dr. Hulan Fray on 04/30/17.  Patient voiced understanding.

## 2017-04-08 NOTE — Progress Notes (Signed)
Nutrition Follow-up  DOCUMENTATION CODES:   Not applicable  INTERVENTION:   -Once diet is advanced, resume:  Ensure Enlive po BID, each supplement provides 350 kcal and 20 grams of protein  NUTRITION DIAGNOSIS:   Predicted suboptimal nutrient intake related to altered GI function, poor appetite as evidenced by per patient/family report.  Ongoing  GOAL:   Patient will meet greater than or equal to 90% of their needs  Progressing  MONITOR:   PO intake, Supplement acceptance, Labs, Weight trends, Skin, I & O's  REASON FOR ASSESSMENT:   Malnutrition Screening Tool    ASSESSMENT:   Kayla Wyatt is a 62 y.o. female past medical history significant for high blood pressure high cholesterol presents with abdominal pain and diarrhea for about 6 days.  Patient states she had taken multiple doses daily of NSAIDs to relieve her pain.  Had one episode of vomiting the day before she came to the hospital.  And then had diarrhea with black stool the day she came to the emergency room.  Patient has no history of blood transfusion.  No history of GI bleed.  No history of ulcers.  1/25- s/p EGD, which revealed single viable vessel ulceration in stomach which was clipped  [name] at time of visit.   Spoke with RN, who reports that [name]. Pt s/p 3 blood transfusions yesterday. Plan for colonoscopy tomorrow per GI.   Pt with good oral intake prior to NPO status; noted 50-75% meal completion. Pt was taking Ensure supplements as well.   Labs reviewed: K: 3.3.   Diet Order:  Diet NPO time specified Except for: Sips with Meds  EDUCATION NEEDS:   Education needs have been addressed  Skin:  Skin Assessment: Reviewed RN Assessment  Last BM:  04/08/17  Height:   Ht Readings from Last 1 Encounters:  04/02/17 5\' 3"  (1.6 m)    Weight:   Wt Readings from Last 1 Encounters:  04/08/17 155 lb 10.3 oz (70.6 kg)    Ideal Body Weight:  52.3 kg  BMI:   Body mass index is 27.57 kg/m.  Estimated Nutritional Needs:   Kcal:  1600-1800  Protein:  80-95 grams  Fluid:  1.6-1.8 L    Kayla Wyatt A. Jimmye Norman, RD, LDN, CDE Pager: [phone] After hours Pager: [phone]

## 2017-04-08 NOTE — Consult Note (Signed)
[address]       [place] [address]             [phone]        Renee Costen Pace Medical Record #[id number] Date of Birth: [birth year]  Referring: Starla Link Primary Care: [name], MD Primary Cardiologist:No primary care provider on file.  Chief Complaint:    Chief Complaint  Patient presents with  . Abdominal Pain   History of Present Illness:      Mrs. Sura is a 62 yo white female with known history of Hypertension and Hyperlipidemia.  She presented to the ED on 04/02/2017 with complaints of abdominal pain and diarrhea.  Workup revealed her hemoglobin level to be low at 6.4.  She required transfusion and GI consult was obtained.  She underwent EGD which identified the presence of a gastric ulcer with a visible vessel that was clipped.  Initially patient did well, but ultimately developed shortness of breath and again drop in hemoglobin level to 6.  She was again transfused and GI was again consulted.  Patient developed rapid Atrial Fibrillation with RVR.  She was initially treated with Cardizem drip, but was later switched to Amiodarone.  Cardiology consult was obtained who was in agreement with continuation of Amiodarone.  They did not feel she would be a candidate for anticoagulation with active bleeding.  On their examination she was noted to have LE edema on exam with a positive Homans sign and they recommend a venous duplex study.  This was obtained and was ultimately positive for DVT.  Due to this CTA of the chest/abdomen/pelvis was obtained to check for possible PE as patient could not be anticoagulated.  The scan was negative for PE, but did have an incidental finding of a 4.1 cm mass in the aorto-pulmonary window.  Due to this finding CT surgery has been consulted.  Currently the patient states she is doing okay.  She denies chest pain and shortness of breath.  She is in NSR with low HR at times, but remains on Amiodarone for this.  She continues  to have blood in her stool and states she is scheduled to undergo colonoscopy tomorrow morning.  She admits to long term smoking of at least 1/2 ppd for the past 40+ years.  She denies previous history of thyroid conditions.  She denies weight loss, hemoptysis, and any environmental exposures.  She is very active and was working full time at [employer] full time prior to hospitalization.    Current Activity/ Functional Status: Patient was independent with mobility/ambulation, transfers, ADL's, IADL's.   Zubrod Score: At the time of surgery this patient's most appropriate activity status/level should be described as: []     0    Normal activity, no symptoms [x]     1    Restricted in physical strenuous activity but ambulatory, able to do out light work []     2    Ambulatory and capable of self care, unable to do work activities, up and about                 more than 50%  Of the time                            []     3    Only limited self care, in bed greater than 50% of waking hours []     4    Completely  disabled, no self care, confined to bed or chair []     5    Moribund  Past Medical History:  Diagnosis Date  . Acute blood loss anemia 04/02/2017  . Gastrointestinal hemorrhage with melena   . Hypercholesteremia   . Hypertension   . Persistent atrial fibrillation with rapid ventricular response ([address]) 04/07/2017  . Vitamin D deficiency     Past Surgical History:  Procedure Laterality Date  . ESOPHAGOGASTRODUODENOSCOPY N/A 04/03/2017   Procedure: ESOPHAGOGASTRODUODENOSCOPY (EGD);  Surgeon: Carol Ada, MD;  Location: [facility];  Service: Endoscopy;  Laterality: N/A;  . LEG SURGERY  1974   Blood Clot Removal     Social History   Tobacco Use  Smoking Status Current Every Day Smoker  Smokeless Tobacco Never Used    Social History   Substance and Sexual Activity  Alcohol Use No  . Frequency: Never    Social History   Socioeconomic History  . Marital status: Unknown    Spouse name:  Not on file  . Number of children: Not on file  . Years of education: Not on file  . Highest education level: Not on file  Social Needs  . Financial resource strain: Not on file  . Food insecurity - worry: Not on file  . Food insecurity - inability: Not on file  . Transportation needs - medical: Not on file  . Transportation needs - non-medical: Not on file  Occupational History  . Occupation: Has to lift heavy boxes at times.    Employer: [employer]  Tobacco Use  . Smoking status: Current Every Day Smoker  . Smokeless tobacco: Never Used  Substance and Sexual Activity  . Alcohol use: No    Frequency: Never  . Drug use: No  . Sexual activity: Not on file  Other Topics Concern  . Not on file  Social History Narrative  . Not on file    No Known Allergies  Current Facility-Administered Medications  Medication Dose Route Frequency Provider Last Rate Last Dose  . 0.9 %  sodium chloride infusion  1,000 mL Intravenous Continuous Alekh, Kshitiz, MD 50 mL/hr at 04/08/17 1101 1,000 mL at 04/08/17 1101  . acetaminophen (TYLENOL) tablet 650 mg  650 mg Oral Q6H PRN Elwin Mocha, MD   650 mg at 04/08/17 1047   Or  . acetaminophen (TYLENOL) suppository 650 mg  650 mg Rectal Q6H PRN Elwin Mocha, MD      . amiodarone (NEXTERONE PREMIX) 360-4.14 MG/200ML-% (1.8 mg/mL) IV infusion  30 mg/hr Intravenous Continuous Barrett, Rhonda G, PA-C 16.7 mL/hr at 04/08/17 1203 30 mg/hr at 04/08/17 1203  . cefTRIAXone (ROCEPHIN) 2 g in dextrose 5 % 50 mL IVPB  2 g Intravenous Q24H Irene Pap N, DO   Stopped at 04/07/17 1433  . diltiazem (CARDIZEM) 100 mg in dextrose 5% 160mL (1 mg/mL) infusion  5-15 mg/hr Intravenous Titrated Barrett, Rhonda G, PA-C   Stopped at 04/07/17 1224  . feeding supplement (ENSURE ENLIVE) (ENSURE ENLIVE) liquid 237 mL  237 mL Oral BID BM Hall, Carole N, DO   237 mL at 04/06/17 1614  . metoprolol tartrate (LOPRESSOR) tablet 12.5 mg  12.5 mg Oral BID Irene Pap N, DO   12.5 mg  at 04/08/17 1047  . nicotine (NICODERM CQ - dosed in mg/24 hours) patch 14 mg  14 mg Transdermal Daily PRN Elwin Mocha, MD      . ondansetron [facility]) tablet 4 mg  4 mg Oral Q6H PRN Aggie Moats,  Layne Benton, MD       Or  . ondansetron [facility]) injection 4 mg  4 mg Intravenous Q6H PRN Elwin Mocha, MD      . pantoprazole (PROTONIX) 80 mg in sodium chloride 0.9 % 250 mL (0.32 mg/mL) infusion  8 mg/hr Intravenous Continuous Kayleen Memos, DO 25 mL/hr at 04/08/17 0427 8 mg/hr at 04/08/17 0427  . [START ON 04/11/2017] pantoprazole (PROTONIX) injection 40 mg  40 mg Intravenous Q12H Hall, Carole N, DO      . polyethylene glycol-electrolytes (NuLYTELY/GoLYTELY) solution 4,000 mL  4,000 mL Oral Once Juanita Craver, MD      . simvastatin (ZOCOR) tablet 20 mg  20 mg Oral Daily Seville, Carole N, DO   20 mg at 04/08/17 1047  . sodium chloride flush (NS) 0.9 % injection 3 mL  3 mL Intravenous Q12H Elwin Mocha, MD   3 mL at 04/08/17 1041    Medications Prior to Admission  Medication Sig Dispense Refill Last Dose  . aspirin 81 MG tablet Take 81 mg by mouth daily.   04/01/2017 at Unknown time  . cholecalciferol (VITAMIN D) 1000 units tablet Take 1,000 Units by mouth 2 (two) times daily.   04/01/2017 at Unknown time  . lisinopril-hydrochlorothiazide (PRINZIDE,ZESTORETIC) 20-12.5 MG tablet Take 1 tablet by mouth daily.   04/01/2017 at Unknown time  . simvastatin (ZOCOR) 20 MG tablet Take 1 tablet by mouth daily.  1 04/01/2017 at Unknown time    History reviewed. No pertinent family history.   Review of Systems:   ROS Constitutional: negative for night sweats and weight loss Respiratory: negative for dyspnea on exertion Cardiovascular: positive for palpitations Gastrointestinal: positive for abdominal pain, melena and active GI bleeding Genitourinary:negative for hematuria Neurological: negative     Cardiac Review of Systems: Y or  [    ]= no  Chest Pain [    ]  Resting SOB [ n  ] Exertional SOB  [  ]    Orthopnea [  ]   Pedal Edema [ y  ]    Palpitations [ y ] Syncope  [  ]   Presyncope [   ]  General Review of Systems: [Y] = yes [  ]=no Constitional: recent weight change [n  ]; anorexia [  ]; fatigue [  ]; nausea [  ]; night sweats [n  ]; fever [ n ]; or chills [  ]                                                               Dental: poor dentition[  ]; Last Dentist visit:   Eye : blurred vision [  ]; diplopia [   ]; vision changes [  ];  Amaurosis fugax[  ]; Resp: cough [ n ];  wheezing[  ];  hemoptysis[  ]; shortness of breath[  ]; paroxysmal nocturnal dyspnea[  ]; dyspnea on exertion[  ]; or orthopnea[  ];  GI:  gallstones[  ], vomiting[  ];  dysphagia[  ]; melena[y  ];  hematochezia Blue.Reese  ]; heartburn[  ];   Hx of  Colonoscopy[ scheduled for tomorrow 1/31 ]; GU: kidney stones [  ]; hematuria[n  ];   dysuria [  ];  nocturia[  ];  history  of     obstruction [  ]; urinary frequency [  ]             Skin: rash, swelling[n  ];, hair loss[  ];  peripheral edema[y  ];  or itching[  ]; Musculosketetal: myalgias[  ];  joint swelling[  ];  joint erythema[  ];  joint pain[  ];  back pain[  ];  Heme/Lymph: bruising[  ];  bleeding[y  ];  anemia[y  ];  Neuro: TIA[  ];  headaches[  ];  stroke[  ];  vertigo[  ];  seizures[  ];   paresthesias[  ];  difficulty walking[  ];  Psych:depression[  ]; anxiety[  ];  Endocrine: diabetes[  ];  thyroid dysfunction[  ];  Immunizations: Flu Florencio.Farrier  ]; Pneumococcal[n  ];    Physical Exam: BP (!) 119/43 (BP Location: Left Arm)   Pulse (!) 54   Temp 98.2 F (36.8 C) (Oral)   Resp 16   Ht 5\' 3"  (1.6 m)   Wt 155 lb 10.3 oz (70.6 kg)   SpO2 100%   BMI 27.57 kg/m   General appearance: alert, cooperative and no distress Head: Normocephalic, without obvious abnormality, atraumatic Lymph nodes: Cervical, supraclavicular, and axillary nodes normal. Resp: clear to auscultation bilaterally Cardio: regular rate and rhythm GI: soft, non-tender; bowel sounds normal; no  masses,  no organomegaly Extremities: edema trace Neurologic: Grossly normal  Diagnostic Studies & Laboratory data:     Recent Radiology Findings:   Ct Angio Chest Pe W Or Wo Contrast  Result Date: 04/07/2017 CLINICAL DATA:  Shortness of breath and DVT. History of hypertension. Anemia. PE suspected, high pretest probability EXAM: CT ANGIOGRAPHY CHEST WITH CONTRAST TECHNIQUE: Multidetector CT imaging of the chest was performed using the standard protocol during bolus administration of intravenous contrast. Multiplanar CT image reconstructions and MIPs were obtained to evaluate the vascular anatomy. CONTRAST:  141mL ISOVUE-370 IOPAMIDOL (ISOVUE-370) INJECTION 76% COMPARISON:  Chest x-ray dated 04/03/2017. FINDINGS: Cardiovascular: Some of the most peripheral segmental and subsegmental pulmonary arteries are difficult to definitively characterize due to patient breathing motion artifact, however, there is no pulmonary embolism identified within the main, lobar or segmental pulmonary arteries bilaterally. No thoracic aortic aneurysm or evidence of dissection. Heart size is upper normal. No pericardial effusion. Mediastinum/Nodes: Soft tissue mass within the aortopulmonary window region of the mediastinum, measuring 4.1 cm greatest dimension. Scattered small lymph nodes elsewhere within the mediastinum. Esophagus is unremarkable. Trachea and central bronchi are unremarkable. Lungs/Pleura: Small right pleural effusion. Mild bibasilar atelectasis. Mild emphysematous changes within the lung apices. Upper Abdomen: Limited images of the upper abdomen are unremarkable. Musculoskeletal: Mild degenerative spurring within the thoracic spine. No acute or suspicious osseous finding. Review of the MIP images confirms the above findings. IMPRESSION: 1. 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 Recommend tissue sampling. 2. No pulmonary embolism  seen, with mild study limitations detailed above. 3. Small right pleural effusion.  Mild bibasilar atelectasis. 4. Mild emphysematous changes within the lung apices. Emphysema (ICD10-J43.9). These results will be called to the ordering clinician or representative by the Radiologist Assistant, and communication documented in the PACS or zVision Dashboard. Electronically Signed   By: Franki Cabot M.D.   On: 04/07/2017 23:26   Nm Gi Blood Loss  Result Date: 04/07/2017 CLINICAL DATA:  62 year old with acute onset of gastrointestinal bleeding approximately 12 hours ago. Technologist states the patient had 2 episodes of  passage of dark red blood during the examination. EXAM: NUCLEAR MEDICINE GASTROINTESTINAL BLEEDING SCAN TECHNIQUE: Sequential abdominal images out to 2 hours were obtained following intravenous administration of Tc-46m labeled red blood cells. RADIOPHARMACEUTICALS:  25.1 mCi Tc-24m pertechnetate in-vitro labeled red cells. COMPARISON:  No prior nuclear imaging. CTA abdomen pelvis 04/03/2017 is correlated. FINDINGS: No abnormal activity in the abdomen or pelvis to be able to identify an active source of bleeding. Expected blood pool activity and hepatic and splenic activity is present. Expected excretion into the urinary tract with faint visualization of the bladder in the pelvis. IMPRESSION: No evidence of active gastrointestinal bleeding. Electronically Signed   By: Evangeline Dakin M.D.   On: 04/07/2017 20:52     I have independently reviewed the above radiologic studies.  Recent Lab Findings: Lab Results  Component Value Date   WBC 21.1 (H) 04/08/2017   HGB 8.5 (L) 04/08/2017   HCT 24.6 (L) 04/08/2017   PLT 149 (L) 04/08/2017   GLUCOSE 113 (H) 04/08/2017   ALT 33 04/08/2017   AST 20 04/08/2017   NA 144 04/08/2017   K 3.3 (L) 04/08/2017   CL 114 (H) 04/08/2017   CREATININE 0.55 04/08/2017   BUN 10 04/08/2017   CO2 22 04/08/2017   TSH 2.440 04/08/2017   INR 1.10 04/03/2017    Assessment / Plan:      1. Incidental Finding of Mediastinal Mass- patient will need PET CT scan and biopsy and rouute of biopsy depending on results  2. Gi Bleed- management per GI, for colonoscopy tomorrow 3. CV- Atrial Fibrillation- new onset, on Amiodarone, Cardiology following 4. RLE DVT- not a candidate for anticoagulation at this time 5. Dispo- patient will need to medically managed.  Once stable for discharge, she will need to have an outpatient PET CT scan performed.  Once completed she will need to follow up with me in the office at which time he can discuss  biopsy Please notify office at time of d/c so follow up can be arranged   Grace Isaac MD      [address] [place] [address] Office [phone]   [place]

## 2017-04-09 ENCOUNTER — Inpatient Hospital Stay (HOSPITAL_COMMUNITY): Payer: BLUE CROSS/BLUE SHIELD | Admitting: Anesthesiology

## 2017-04-09 ENCOUNTER — Encounter (HOSPITAL_COMMUNITY): Payer: Self-pay | Admitting: Anesthesiology

## 2017-04-09 ENCOUNTER — Encounter (HOSPITAL_COMMUNITY)
Admission: EM | Disposition: A | Discharge status: Rehabilitation Facility | Payer: Self-pay | Source: Home / Self Care | Attending: Internal Medicine

## 2017-04-09 HISTORY — PX: COLONOSCOPY: SHX5424

## 2017-04-09 LAB — CULTURE, BLOOD (ROUTINE X 2)
Culture: NO GROWTH
Culture: NO GROWTH
Special Requests: ADEQUATE
Special Requests: ADEQUATE

## 2017-04-09 LAB — MAGNESIUM: Magnesium: 1.6 mg/dL — ABNORMAL LOW (ref 1.7–2.4)

## 2017-04-09 LAB — CBC WITH DIFFERENTIAL/PLATELET
Basophils Absolute: 0.1 10*3/uL (ref 0.0–0.1)
Basophils Relative: 0 %
Eosinophils Absolute: 0.2 10*3/uL (ref 0.0–0.7)
Eosinophils Relative: 1 %
HCT: 23.7 % — ABNORMAL LOW (ref 36.0–46.0)
Hemoglobin: 7.9 g/dL — ABNORMAL LOW (ref 12.0–15.0)
Lymphocytes Relative: 19 %
Lymphs Abs: 2.8 10*3/uL (ref 0.7–4.0)
MCH: 31.2 pg (ref 26.0–34.0)
MCHC: 33.3 g/dL (ref 30.0–36.0)
MCV: 93.7 fL (ref 78.0–100.0)
Monocytes Absolute: 0.6 10*3/uL (ref 0.1–1.0)
Monocytes Relative: 4 %
Neutro Abs: 11.6 10*3/uL — ABNORMAL HIGH (ref 1.7–7.7)
Neutrophils Relative %: 76 %
Platelets: 145 10*3/uL — ABNORMAL LOW (ref 150–400)
RBC: 2.53 MIL/uL — ABNORMAL LOW (ref 3.87–5.11)
RDW: 16.2 % — ABNORMAL HIGH (ref 11.5–15.5)
WBC: 15.2 10*3/uL — ABNORMAL HIGH (ref 4.0–10.5)

## 2017-04-09 LAB — BASIC METABOLIC PANEL
Anion gap: 5 (ref 5–15)
BUN: 7 mg/dL (ref 6–20)
CO2: 23 mmol/L (ref 22–32)
Calcium: 7.6 mg/dL — ABNORMAL LOW (ref 8.9–10.3)
Chloride: 115 mmol/L — ABNORMAL HIGH (ref 101–111)
Creatinine, Ser: 0.56 mg/dL (ref 0.44–1.00)
GFR calc Af Amer: >60 mL/min (ref >60)
GFR calc non Af Amer: >60 mL/min (ref >60)
Glucose, Bld: 105 mg/dL — ABNORMAL HIGH (ref 65–99)
Potassium: 3.7 mmol/L (ref 3.5–5.1)
Sodium: 143 mmol/L (ref 135–145)

## 2017-04-09 SURGERY — COLONOSCOPY
Anesthesia: Monitor Anesthesia Care | Laterality: Left

## 2017-04-09 MED ORDER — LACTATED RINGERS IV SOLN
INTRAVENOUS | Status: DC
  Administered 2017-04-09: 1000 mL via INTRAVENOUS

## 2017-04-09 MED ORDER — PROPOFOL 500 MG/50ML IV EMUL
INTRAVENOUS | Status: DC | PRN
  Administered 2017-04-09: 200 ug/kg/min via INTRAVENOUS

## 2017-04-09 MED ORDER — GERHARDT'S BUTT CREAM
TOPICAL_CREAM | Freq: Three times a day (TID) | CUTANEOUS | Status: DC
  Administered 2017-04-09 – 2017-04-10 (×3): 1 via TOPICAL
  Administered 2017-04-10 – 2017-04-14 (×14): via TOPICAL
  Administered 2017-04-15: 1 via TOPICAL
  Administered 2017-04-15 (×2): via TOPICAL
  Administered 2017-04-16: 1 via TOPICAL
  Administered 2017-04-16 – 2017-04-19 (×9): via TOPICAL
  Administered 2017-04-19: 1 via TOPICAL
  Administered 2017-04-19 – 2017-04-22 (×6): via TOPICAL
  Administered 2017-04-22: 1 via TOPICAL
  Filled 2017-04-09 (×3): qty 1

## 2017-04-09 MED ORDER — MAGNESIUM SULFATE 2 GM/50ML IV SOLN
2.0000 g | Freq: Once | INTRAVENOUS | Status: AC
  Administered 2017-04-09: 2 g via INTRAVENOUS
  Filled 2017-04-09: qty 50

## 2017-04-09 NOTE — Progress Notes (Signed)
HR dropped to 40-45 sustained.  Amiodarone turned off, Dr. Radford Pax notified.  Order to keep amiodarone off received.  Will continue to monitor.

## 2017-04-09 NOTE — Anesthesia Postprocedure Evaluation (Signed)
Anesthesia Post Note  Patient: Aricela Bertagnolli  Procedure(s) Performed: COLONOSCOPY (Left )     Patient location during evaluation: PACU Anesthesia Type: MAC Level of consciousness: awake and alert Pain management: pain level controlled Vital Signs Assessment: post-procedure vital signs reviewed and stable Respiratory status: spontaneous breathing, nonlabored ventilation, respiratory function stable and patient connected to nasal cannula oxygen Cardiovascular status: stable and blood pressure returned to baseline Postop Assessment: no apparent nausea or vomiting Anesthetic complications: no    Last Vitals:  Vitals:   04/09/17 1400 04/09/17 1401  BP:  (!) 135/38  Pulse: (!) 56 (!) 57  Resp: (!) 22 18  Temp:    SpO2: 100% 100%    Last Pain:  Vitals:   04/09/17 1342  TempSrc: Oral  PainSc:                  Effie Berkshire

## 2017-04-09 NOTE — Plan of Care (Signed)
Continue current care plan 

## 2017-04-09 NOTE — Transfer of Care (Signed)
Immediate Anesthesia Transfer of Care Note  Patient: Sarin Comunale  Procedure(s) Performed: COLONOSCOPY (Left )  Patient Location: Endoscopy Unit  Anesthesia Type:MAC  Level of Consciousness: awake, alert  and oriented  Airway & Oxygen Therapy: Patient Spontanous Breathing and Patient connected to nasal cannula oxygen  Post-op Assessment: Report given to RN and Post -op Vital signs reviewed and stable  Post vital signs: Reviewed and stable  Last Vitals:  Vitals:   04/09/17 1155 04/09/17 1342  BP:  (!) 104/42  Pulse: 61 71  Resp: 20 (!) 37  Temp: 36.7 C   SpO2:  100%    Last Pain:  Vitals:   04/09/17 1155  TempSrc: Oral  PainSc:       Patients Stated Pain Goal: 2 (03/49/17 9150)  Complications: No apparent anesthesia complications

## 2017-04-09 NOTE — Anesthesia Preprocedure Evaluation (Addendum)
Anesthesia Evaluation  Patient identified by MRN, date of birth, ID band Patient awake    Reviewed: Allergy & Precautions, NPO status , Patient's Chart, lab work & pertinent test results  Airway Mallampati: I  TM Distance: >3 FB Neck ROM: Full    Dental  (+) Edentulous Upper, Edentulous Lower, Dental Advisory Given   Pulmonary Current Smoker,    breath sounds clear to auscultation       Cardiovascular hypertension, Pt. on medications  Rhythm:Regular Rate:Normal     Neuro/Psych    GI/Hepatic negative GI ROS, Neg liver ROS,   Endo/Other  negative endocrine ROS  Renal/GU negative Renal ROS     Musculoskeletal   Abdominal (+) + obese,   Peds  Hematology negative hematology ROS (+)   Anesthesia Other Findings   Reproductive/Obstetrics                            Lab Results  Component Value Date   WBC 15.2 (H) 04/09/2017   HGB 7.9 (L) 04/09/2017   HCT 23.7 (L) 04/09/2017   MCV 93.7 04/09/2017   PLT 145 (L) 04/09/2017   Lab Results  Component Value Date   INR 1.10 04/03/2017   INR 1.14 04/02/2017   Lab Results  Component Value Date   CREATININE 0.56 04/09/2017   BUN 7 04/09/2017   NA 143 04/09/2017   K 3.7 04/09/2017   CL 115 (H) 04/09/2017   CO2 23 04/09/2017   EKG: atrial fibrillation. Converted to SR.  Echo: - Left ventricle: The cavity size was normal. Wall thickness was   normal. Systolic function was normal. The estimated ejection   fraction was in the range of 55% to 60%. Wall motion was normal;   there were no regional wall motion abnormalities. Features are   consistent with a pseudonormal left ventricular filling pattern,   with concomitant abnormal relaxation and increased filling   pressure (grade 2 diastolic dysfunction). - Mitral valve: There was mild regurgitation. - Left atrium: The atrium was mildly dilated. - Atrial septum: There was increased thickness of  the septum,   consistent with lipomatous hypertrophy.  Anesthesia Physical Anesthesia Plan  ASA: II  Anesthesia Plan: MAC   Post-op Pain Management:    Induction: Intravenous  PONV Risk Score and Plan: 1 and Propofol infusion  Airway Management Planned: Natural Airway and Nasal Cannula  Additional Equipment: None  Intra-op Plan:   Post-operative Plan:   Informed Consent: I have reviewed the patients History and Physical, chart, labs and discussed the procedure including the risks, benefits and alternatives for the proposed anesthesia with the patient or authorized representative who has indicated his/her understanding and acceptance.     Plan Discussed with: CRNA  Anesthesia Plan Comments:         Anesthesia Quick Evaluation

## 2017-04-09 NOTE — Op Note (Signed)
[facility] Patient Name: Kayla Wyatt Procedure Date : 04/09/2017 MRN: [id number] Attending MD: Carol Ada , MD Date of Birth: 1956-01-07 CSN: [id number] Age: 62 Admit Type: Inpatient Procedure:                Colonoscopy Indications:              Hematochezia Providers:                Carol Ada, MD, Cleda Daub, RN, Elspeth Cho                            Tech., Technician Referring MD:              Medicines:                Propofol per Anesthesia Complications:            No immediate complications. Estimated Blood Loss:     Estimated blood loss: none. Procedure:                Pre-Anesthesia Assessment:                           - Prior to the procedure, a History and Physical                            was performed, and patient medications and                            allergies were reviewed. The patient's tolerance of                            previous anesthesia was also reviewed. The risks                            and benefits of the procedure and the sedation                            options and risks were discussed with the patient.                            All questions were answered, and informed consent                            was obtained. Prior Anticoagulants: The patient has                            taken no previous anticoagulant or antiplatelet                            agents. ASA Grade Assessment: III - A patient with                            severe systemic disease. After reviewing the risks                            and benefits,  the patient was deemed in                            satisfactory condition to undergo the procedure.                           - Sedation was administered by an anesthesia                            professional. Deep sedation was attained.                           After obtaining informed consent, the colonoscope                            was passed under direct vision. Throughout the                          procedure, the patient's blood pressure, pulse, and                            oxygen saturations were monitored continuously. The                            [id number] ([id number]) scope was introduced through                            the anus and advanced to the the cecum, identified                            by appendiceal orifice and ileocecal valve. The                            colonoscopy was somewhat difficult due to a                            tortuous colon. Successful completion of the                            procedure was aided by straightening and shortening                            the scope to obtain bowel loop reduction. The                            patient tolerated the procedure well. The quality                            of the bowel preparation was good. The ileocecal                            valve, appendiceal orifice, and rectum were  photographed. Scope In: 1:10:35 PM Scope Out: 1:29:53 PM Scope Withdrawal Time: 0 hours 8 minutes 4 seconds  Total Procedure Duration: 0 hours 19 minutes 18 seconds  Findings:      Scattered small and large-mouthed diverticula were found in the sigmoid       colon. There was no evidence of any inflammation, ulcerations, erosions,       polyps, or masses to explain the hematochezia and anemia. There was no       evidence of fresh or old blood in the colon. Impression:               - Diverticulosis in the sigmoid colon.                           - No specimens collected. Moderate Sedation:      None Recommendation:           - Return patient to hospital ward for ongoing care.                           - Resume regular diet.                           - Continue present medications.                           - Repeat colonoscopy in 10 years for surveillance.                           - If rebleeding occurs a bleeding can can be                            pursued. Procedure  Code(s):        --- Professional ---                           [phone], Colonoscopy, flexible; diagnostic, including                            collection of specimen(s) by brushing or washing,                            when performed (separate procedure) Diagnosis Code(s):        --- Professional ---                           K92.1, Melena (includes Hematochezia)                           K57.30, Diverticulosis of large intestine without                            perforation or abscess without bleeding CPT copyright 2016 [employer]. All rights reserved. The codes documented in this report are preliminary and upon coder review may  be revised to meet current compliance requirements. Carol Ada, MD Carol Ada, MD 04/09/2017 1:37:56 PM This report has been signed electronically. Number of Addenda: 0

## 2017-04-09 NOTE — Progress Notes (Signed)
Patient ID: Kayla Wyatt, female   DOB: [birth year], 62 y.o.   MRN: [id number]  PROGRESS NOTE    Dimple Bastyr  JOI:[id number] DOB: September 11, 1955 DOA: 04/02/2017 PCP: [name], MD   Brief Narrative:  62 year old female with history of hypertension and hyperlipidemia presented on 04/02/2017 with abdominal pain and diarrhea.  Her hemoglobin was 6.4.  She was transfused packed red cells.  GI was consulted.  EGD showed gastric ulcer with visible vessel that was clipped.  Patient developed new onset A. fib with RVR for which she was initially put on Cardizem drip which was switched to amiodarone drip.  Cardiology was consulted.  Patient was found to have right lower extremity DVT.  Patient is still having intermittent rectal bleed.  She is scheduled for colonoscopy today.  Assessment & Plan:   Principal Problem:   Gastrointestinal hemorrhage with melena Active Problems:   Acute blood loss anemia   Lower abdominal pain of unknown etiology   Abnormal computed tomography angiography (CTA) of abdomen and pelvis   Persistent atrial fibrillation with rapid ventricular response (HCC)   Acute blood loss anemia in the setting of gastric ulcer/upper GI bleed along with probable lower GI bleed as well -Status post 5 units packed red cells transfusion so far since admission.   -Hemoglobin 7.9 this morning.   -GI is planning for colonoscopy today  -Bleeding scan on 04/07/2017 was negative for active bleeding.   -Continue monitoring H&H -EGD done 04/03/17 revealed: A single visible vessel in the setting of a mild ulceration was found in the stomach. Clipped. -CTA abdomen: stenosis of IMA with collaterals -Continue Protonix drip  Paroxysmal Afib with RVR  -Currently rate controlled.  IV amiodarone drip was discontinued due to heart rate dropping to the 40s.  Cardiology following  -Cannot anticoagulate at this moment because of GI bleed -Continue metoprolol  Newly diagnosed RLE DVT  -not  anticoagulated due to active GI bleed -CTA chest is negative for PE. 2D echo 04/06/17 LVEF 15-83% (grade 2 diastolic dysfunction) -Spoke to Dr. Lissa Morales surgery on phone on 04/08/2017 about the need for IVC filter.  He recommended waiting for colonoscopy findings and further GI recommendations.  If patient ends up needing IVC filter, will re-notify vascular surgery.  E-coli bacteremia - probably from UTI -Continue Rocephin  Hypokalemia -Improved  Hypomagnesemia -Replace.  Repeat a.m. labs  UTI -Continue antibiotics as above  Hypertension -BP stable -Continue metoprolol  Hyperlipidemia -Continue statin  Moderate protein calorie malnutrition -encourage to increase po intake  Acute Transaminitis -Resolved.  Leukocytosis -Probably reactive.  Improving.  Repeat a.m. Labs  Incidental finding of mediastinal mass -Cardiothoracic surgery consultation appreciated.  Patient will need outpatient PET/CT scan and biopsy.  Outpatient follow-up with cardio thoracic surgery.  DVT prophylaxis: None due to active GI bleeding and RLE DVT Code Status: Full Family Communication: Spoke to brother at bedside Disposition Plan: Depends on clinical outcome  Consultants:  GI/cardiology/gynecology/cardiothoracic surgery/spoke to vascular surgery Dr. Trula Slade on phone on 04/08/2017  Procedures:  EGD on 04/03/2017 Impression:               - Normal esophagus.                           - A single visible vessel in the setting of a mild                            ulceration  was found in the stomach. Clip (MR                            unsafe) was placed.                           - Normal examined duodenum.                           - No specimens collected.  -2D echo 04/06/17 LVEF 97-98% (grade 2 diastolic dysfunction)  Antimicrobials:  Anti-infectives (From admission, onward)   Start     Dose/Rate Route Frequency Ordered Stop   04/04/17 1500  cefTRIAXone (ROCEPHIN) 2 g in dextrose 5  % 50 mL IVPB     2 g 100 mL/hr over 30 Minutes Intravenous Every 24 hours 04/04/17 1307     04/03/17 0400  vancomycin (VANCOCIN) 500 mg in sodium chloride 0.9 % 100 mL IVPB  Status:  Discontinued     500 mg 100 mL/hr over 60 Minutes Intravenous Every 12 hours 04/02/17 1630 04/04/17 1307   04/02/17 2200  piperacillin-tazobactam (ZOSYN) IVPB 3.375 g  Status:  Discontinued     3.375 g 12.5 mL/hr over 240 Minutes Intravenous Every 8 hours 04/02/17 1630 04/04/17 1307   04/02/17 1600  piperacillin-tazobactam (ZOSYN) IVPB 3.375 g     3.375 g 100 mL/hr over 30 Minutes Intravenous  Once 04/02/17 1546 04/02/17 1645   04/02/17 1600  vancomycin (VANCOCIN) IVPB 1000 mg/200 mL premix     1,000 mg 200 mL/hr over 60 Minutes Intravenous  Once 04/02/17 1546 04/02/17 1711       Subjective: Patient seen and examined at bedside.  No new complaints.  Feels weak and tired.  No overnight fever or vomiting.  Objective: Vitals:   04/08/17 2100 04/08/17 2251 04/09/17 0523 04/09/17 0800  BP: (!) 154/55 (!) 141/45 (!) 127/50   Pulse: (!) 58 (!) 51 60   Resp: 16 16 (!) 28   Temp:  (!) 97.5 F (36.4 C) 97.8 F (36.6 C) 97.6 F (36.4 C)  TempSrc:  Oral Oral Oral  SpO2:  100% 100%   Weight:   72.5 kg (159 lb 13.3 oz)   Height:        Intake/Output Summary (Last 24 hours) at 04/09/2017 0851 Last data filed at 04/09/2017 0600 Gross per 24 hour  Intake 3730.15 ml  Output -  Net 3730.15 ml   Filed Weights   04/07/17 0537 04/08/17 0500 04/09/17 0523  Weight: 67.2 kg (148 lb 2.4 oz) 70.6 kg (155 lb 10.3 oz) 72.5 kg (159 lb 13.3 oz)    Examination:  General exam: Appears calm and comfortable  Respiratory system: Bilateral decreased breath sound at bases Cardiovascular system: S1 & S2 heard, intermittent bradycardia  gastrointestinal system: Abdomen is nondistended, soft and nontender. Normal bowel sounds heard. Extremities: No cyanosis, clubbing; right lower extremity mildly tender and mildly  edematous   Data Reviewed: I have personally reviewed following labs and imaging studies  CBC: Recent Labs  Lab 04/05/17 0447 04/06/17 0211 04/07/17 1006 04/08/17 0248 04/08/17 1211 04/09/17 0255  WBC 10.4 13.7* 18.5* 21.1*  --  15.2*  NEUTROABS  --   --   --   --   --  11.6*  HGB 7.9* 8.1* 6.0* 8.8* 8.5* 7.9*  HCT 23.8* 24.3* 17.8* 24.6* 24.6* 23.7*  MCV 89.8 91.0  92.2 90.1  --  93.7  PLT 228 262 238 149*  --  211*   Basic Metabolic Panel: Recent Labs  Lab 04/03/17 0409 04/04/17 0654 04/05/17 0447 04/07/17 1006 04/08/17 0248 04/09/17 0255  NA 142 147* 145 144 144 143  K 3.3* 3.1* 4.0 3.3* 3.3* 3.7  CL 111 114* 115* 113* 114* 115*  CO2 22 24 21* 24 22 23   GLUCOSE 103* 108* 104* 109* 113* 105*  BUN 73* 19 12 17 10 7   CREATININE 0.74 0.69 0.60 0.51 0.55 0.56  CALCIUM 7.9* 8.2* 7.9* 7.7* 7.3* 7.6*  MG 2.3  --   --   --   --  1.6*  PHOS 2.3*  --   --   --   --   --    GFR: Estimated Creatinine Clearance: 67.7 mL/min (by C-G formula based on SCr of 0.56 mg/dL). Liver Function Tests: Recent Labs  Lab 04/02/17 1459 04/05/17 1504 04/07/17 1006 04/08/17 0248  AST 106* 47* 24 20  ALT 162* 70* 40 33  ALKPHOS 118 64 51 42  BILITOT 0.7 0.5 0.3 0.8  PROT 5.2* 5.2* 4.4* 4.0*  ALBUMIN 2.1* 2.2* 1.9* 1.9*   Recent Labs  Lab 04/02/17 1459  LIPASE 17   No results for input(s): AMMONIA in the last 168 hours. Coagulation Profile: Recent Labs  Lab 04/02/17 1459 04/03/17 0409  INR 1.14 1.10   Cardiac Enzymes: No results for input(s): CKTOTAL, CKMB, CKMBINDEX, TROPONINI in the last 168 hours. BNP (last 3 results) No results for input(s): PROBNP in the last 8760 hours. HbA1C: No results for input(s): HGBA1C in the last 72 hours. CBG: No results for input(s): GLUCAP in the last 168 hours. Lipid Profile: No results for input(s): CHOL, HDL, LDLCALC, TRIG, CHOLHDL, LDLDIRECT in the last 72 hours. Thyroid Function Tests: Recent Labs    04/08/17 0248  TSH 2.440    Anemia Panel: No results for input(s): VITAMINB12, FOLATE, FERRITIN, TIBC, IRON, RETICCTPCT in the last 72 hours. Sepsis Labs: Recent Labs  Lab 04/02/17 1518 04/02/17 1939 04/02/17 2128 04/03/17 0409 04/06/17 0914  PROCALCITON  --   --   --   --  0.13  LATICACIDVEN 7.98* 1.05 1.2 0.8  --     Recent Results (from the past 240 hour(s))  Culture, blood (Routine x 2)     Status: Abnormal   Collection Time: 04/02/17  3:40 PM  Result Value Ref Range Status   Specimen Description BLOOD RIGHT FOREARM  Final   Special Requests   Final    BOTTLES DRAWN AEROBIC AND ANAEROBIC Blood Culture adequate volume   Culture  Setup Time   Final    GRAM NEGATIVE RODS AEROBIC BOTTLE ONLY CRITICAL RESULT CALLED TO, READ BACK BY AND VERIFIED WITH: JCARNEY,PHARMD @ 2219 04/03/17 BY LHOWARD    Culture ESCHERICHIA COLI (A)  Final   Report Status 04/05/2017 FINAL  Final   Organism ID, Bacteria ESCHERICHIA COLI  Final      Susceptibility   Escherichia coli - MIC*    AMPICILLIN <=2 SENSITIVE Sensitive     CEFAZOLIN <=4 SENSITIVE Sensitive     CEFEPIME <=1 SENSITIVE Sensitive     CEFTAZIDIME <=1 SENSITIVE Sensitive     CEFTRIAXONE <=1 SENSITIVE Sensitive     CIPROFLOXACIN <=0.25 SENSITIVE Sensitive     GENTAMICIN <=1 SENSITIVE Sensitive     IMIPENEM <=0.25 SENSITIVE Sensitive     TRIMETH/SULFA <=20 SENSITIVE Sensitive     AMPICILLIN/SULBACTAM <=2 SENSITIVE Sensitive  PIP/TAZO <=4 SENSITIVE Sensitive     Extended ESBL NEGATIVE Sensitive     * ESCHERICHIA COLI  Blood Culture ID Panel (Reflexed)     Status: Abnormal   Collection Time: 04/02/17  3:40 PM  Result Value Ref Range Status   Enterococcus species NOT DETECTED NOT DETECTED Final   Listeria monocytogenes NOT DETECTED NOT DETECTED Final   Staphylococcus species NOT DETECTED NOT DETECTED Final   Staphylococcus aureus NOT DETECTED NOT DETECTED Final   Streptococcus species NOT DETECTED NOT DETECTED Final   Streptococcus agalactiae NOT  DETECTED NOT DETECTED Final   Streptococcus pneumoniae NOT DETECTED NOT DETECTED Final   Streptococcus pyogenes NOT DETECTED NOT DETECTED Final   Acinetobacter baumannii NOT DETECTED NOT DETECTED Final   Enterobacteriaceae species DETECTED (A) NOT DETECTED Final    Comment: Enterobacteriaceae represent a large family of gram-negative bacteria, not a single organism. CRITICAL RESULT CALLED TO, READ BACK BY AND VERIFIED WITH: JCARNEY,PHARMD @2219  04/03/17 BY LHOWARD    Enterobacter cloacae complex NOT DETECTED NOT DETECTED Final   Escherichia coli DETECTED (A) NOT DETECTED Final    Comment: CRITICAL RESULT CALLED TO, READ BACK BY AND VERIFIED WITH: JCARNEY,PHARMD @2219  04/03/17 BY LHOWARD    Klebsiella oxytoca NOT DETECTED NOT DETECTED Final   Klebsiella pneumoniae NOT DETECTED NOT DETECTED Final   Proteus species NOT DETECTED NOT DETECTED Final   Serratia marcescens NOT DETECTED NOT DETECTED Final   Carbapenem resistance NOT DETECTED NOT DETECTED Final   Haemophilus influenzae NOT DETECTED NOT DETECTED Final   Neisseria meningitidis NOT DETECTED NOT DETECTED Final   Pseudomonas aeruginosa NOT DETECTED NOT DETECTED Final   Candida albicans NOT DETECTED NOT DETECTED Final   Candida glabrata NOT DETECTED NOT DETECTED Final   Candida krusei NOT DETECTED NOT DETECTED Final   Candida parapsilosis NOT DETECTED NOT DETECTED Final   Candida tropicalis NOT DETECTED NOT DETECTED Final  Culture, blood (Routine x 2)     Status: None   Collection Time: 04/02/17  3:44 PM  Result Value Ref Range Status   Specimen Description BLOOD RIGHT FOREARM  Final   Special Requests   Final    BOTTLES DRAWN AEROBIC AND ANAEROBIC Blood Culture adequate volume   Culture NO GROWTH 5 DAYS  Final   Report Status 04/07/2017 FINAL  Final  Urine culture     Status: Abnormal   Collection Time: 04/02/17  5:05 PM  Result Value Ref Range Status   Specimen Description URINE, RANDOM  Final   Special Requests NONE  Final    Culture MULTIPLE SPECIES PRESENT, SUGGEST RECOLLECTION (A)  Final   Report Status 04/03/2017 FINAL  Final  MRSA PCR Screening     Status: None   Collection Time: 04/02/17  8:43 PM  Result Value Ref Range Status   MRSA by PCR NEGATIVE NEGATIVE Final    Comment:        The GeneXpert MRSA Assay (FDA approved for NASAL specimens only), is one component of a comprehensive MRSA colonization surveillance program. It is not intended to diagnose MRSA infection nor to guide or monitor treatment for MRSA infections.   Culture, blood (routine x 2)     Status: None (Preliminary result)   Collection Time: 04/04/17  3:16 PM  Result Value Ref Range Status   Specimen Description BLOOD LEFT ANTECUBITAL  Final   Special Requests IN PEDIATRIC BOTTLE Blood Culture adequate volume  Final   Culture NO GROWTH 4 DAYS  Final   Report Status PENDING  Incomplete  Culture, blood (routine x 2)     Status: None (Preliminary result)   Collection Time: 04/04/17  3:19 PM  Result Value Ref Range Status   Specimen Description BLOOD LEFT HAND  Final   Special Requests IN PEDIATRIC BOTTLE Blood Culture adequate volume  Final   Culture NO GROWTH 4 DAYS  Final   Report Status PENDING  Incomplete  Culture, Urine     Status: None   Collection Time: 04/05/17 12:19 PM  Result Value Ref Range Status   Specimen Description URINE, RANDOM  Final   Special Requests NONE  Final   Culture NO GROWTH  Final   Report Status 04/06/2017 FINAL  Final         Radiology Studies: Ct Angio Chest Pe W Or Wo Contrast  Result Date: 04/07/2017 CLINICAL DATA:  Shortness of breath and DVT. History of hypertension. Anemia. PE suspected, high pretest probability EXAM: CT ANGIOGRAPHY CHEST WITH CONTRAST TECHNIQUE: Multidetector CT imaging of the chest was performed using the standard protocol during bolus administration of intravenous contrast. Multiplanar CT image reconstructions and MIPs were obtained to evaluate the vascular  anatomy. CONTRAST:  117mL ISOVUE-370 IOPAMIDOL (ISOVUE-370) INJECTION 76% COMPARISON:  Chest x-ray dated 04/03/2017. FINDINGS: Cardiovascular: Some of the most peripheral segmental and subsegmental pulmonary arteries are difficult to definitively characterize due to patient breathing motion artifact, however, there is no pulmonary embolism identified within the main, lobar or segmental pulmonary arteries bilaterally. No thoracic aortic aneurysm or evidence of dissection. Heart size is upper normal. No pericardial effusion. Mediastinum/Nodes: Soft tissue mass within the aortopulmonary window region of the mediastinum, measuring 4.1 cm greatest dimension. Scattered small lymph nodes elsewhere within the mediastinum. Esophagus is unremarkable. Trachea and central bronchi are unremarkable. Lungs/Pleura: Small right pleural effusion. Mild bibasilar atelectasis. Mild emphysematous changes within the lung apices. Upper Abdomen: Limited images of the upper abdomen are unremarkable. Musculoskeletal: Mild degenerative spurring within the thoracic spine. No acute or suspicious osseous finding. Review of the MIP images confirms the above findings. IMPRESSION: 1. 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 Recommend tissue sampling. 2. No pulmonary embolism seen, with mild study limitations detailed above. 3. Small right pleural effusion.  Mild bibasilar atelectasis. 4. Mild emphysematous changes within the lung apices. Emphysema (ICD10-J43.9). These results will be called to the ordering clinician or representative by the Radiologist Assistant, and communication documented in the PACS or zVision Dashboard. Electronically Signed   By: Franki Cabot M.D.   On: 04/07/2017 23:26   Nm Gi Blood Loss  Result Date: 04/07/2017 CLINICAL DATA:  62 year old with acute onset of gastrointestinal bleeding approximately 12 hours ago. Technologist states the  patient had 2 episodes of passage of dark red blood during the examination. EXAM: NUCLEAR MEDICINE GASTROINTESTINAL BLEEDING SCAN TECHNIQUE: Sequential abdominal images out to 2 hours were obtained following intravenous administration of Tc-39m labeled red blood cells. RADIOPHARMACEUTICALS:  25.1 mCi Tc-47m pertechnetate in-vitro labeled red cells. COMPARISON:  No prior nuclear imaging. CTA abdomen pelvis 04/03/2017 is correlated. FINDINGS: No abnormal activity in the abdomen or pelvis to be able to identify an active source of bleeding. Expected blood pool activity and hepatic and splenic activity is present. Expected excretion into the urinary tract with faint visualization of the bladder in the pelvis. IMPRESSION: No evidence of active gastrointestinal bleeding. Electronically Signed   By: Evangeline Dakin M.D.   On: 04/07/2017 20:52        Scheduled Meds: .  feeding supplement (ENSURE ENLIVE)  237 mL Oral BID BM  . metoprolol tartrate  12.5 mg Oral BID  . [START ON 04/11/2017] pantoprazole  40 mg Intravenous Q12H  . simvastatin  20 mg Oral Daily  . sodium chloride flush  3 mL Intravenous Q12H   Continuous Infusions: . sodium chloride 1,000 mL (04/08/17 1900)  . sodium chloride 20 mL/hr at 04/08/17 2202  . cefTRIAXone (ROCEPHIN)  IV Stopped (04/08/17 1455)  . magnesium sulfate 1 - 4 g bolus IVPB    . pantoprozole (PROTONIX) infusion 8 mg/hr (04/08/17 1900)     LOS: 7 days        Aline August, MD [facility] Pager [phone]  If 7PM-7AM, please contact night-coverage [email or web address] Password [facility] 04/09/2017, 8:51 AM

## 2017-04-09 NOTE — Progress Notes (Addendum)
Progress Note  Patient Name: Kayla Wyatt Date of Encounter: 04/09/2017  Primary Cardiologist: Dr Stanford Breed  Subjective   No chest pain or dyspnea; no abdominal pain; no melena  Inpatient Medications    Scheduled Meds: . feeding supplement (ENSURE ENLIVE)  237 mL Oral BID BM  . metoprolol tartrate  12.5 mg Oral BID  . [START ON 04/11/2017] pantoprazole  40 mg Intravenous Q12H  . simvastatin  20 mg Oral Daily  . sodium chloride flush  3 mL Intravenous Q12H   Continuous Infusions: . sodium chloride 1,000 mL (04/08/17 1900)  . sodium chloride 20 mL/hr at 04/08/17 2202  . amiodarone Stopped (04/09/17 0130)  . cefTRIAXone (ROCEPHIN)  IV Stopped (04/08/17 1455)  . diltiazem (CARDIZEM) infusion Stopped (04/07/17 1224)  . magnesium sulfate 1 - 4 g bolus IVPB    . pantoprozole (PROTONIX) infusion 8 mg/hr (04/08/17 1900)   PRN Meds: acetaminophen **OR** acetaminophen, nicotine, ondansetron **OR** ondansetron (ZOFRAN) IV   Vital Signs    Vitals:   04/08/17 2000 04/08/17 2100 04/08/17 2251 04/09/17 0523  BP: (!) 144/47 (!) 154/55 (!) 141/45 (!) 127/50  Pulse: 70 (!) 58 (!) 51 60  Resp: (!) 26 16 16  (!) 28  Temp:   (!) 97.5 F (36.4 C) 97.8 F (36.6 C)  TempSrc:   Oral Oral  SpO2:   100% 100%  Weight:    159 lb 13.3 oz (72.5 kg)  Height:        Intake/Output Summary (Last 24 hours) at 04/09/2017 0750 Last data filed at 04/09/2017 0600 Gross per 24 hour  Intake 4202.57 ml  Output -  Net 4202.57 ml   Filed Weights   04/07/17 0537 04/08/17 0500 04/09/17 0523  Weight: 148 lb 2.4 oz (67.2 kg) 155 lb 10.3 oz (70.6 kg) 159 lb 13.3 oz (72.5 kg)    Telemetry    Sinus bradycardia - Personally Reviewed   Physical Exam   GEN: WD WN No acute distress.   Neck: Supple Cardiac: RRR Respiratory: CTA GI: Soft, NT ND   MS: 1+RLE edema Neuro:  Grossly intact   Labs    Chemistry Recent Labs  Lab 04/05/17 1504 04/07/17 1006 04/08/17 0248 04/09/17 0255  NA  --  144  144 143  K  --  3.3* 3.3* 3.7  CL  --  113* 114* 115*  CO2  --  24 22 23   GLUCOSE  --  109* 113* 105*  BUN  --  17 10 7   CREATININE  --  0.51 0.55 0.56  CALCIUM  --  7.7* 7.3* 7.6*  PROT 5.2* 4.4* 4.0*  --   ALBUMIN 2.2* 1.9* 1.9*  --   AST 47* 24 20  --   ALT 70* 40 33  --   ALKPHOS 64 51 42  --   BILITOT 0.5 0.3 0.8  --   GFRNONAA  --  >60 >60 >60  GFRAA  --  >60 >60 >60  ANIONGAP  --  7 8 5      Hematology Recent Labs  Lab 04/07/17 1006 04/08/17 0248 04/08/17 1211 04/09/17 0255  WBC 18.5* 21.1*  --  15.2*  RBC 1.93* 2.73*  --  2.53*  HGB 6.0* 8.8* 8.5* 7.9*  HCT 17.8* 24.6* 24.6* 23.7*  MCV 92.2 90.1  --  93.7  MCH 31.1 32.2  --  31.2  MCHC 33.7 35.8  --  33.3  RDW 15.5 14.3  --  16.2*  PLT 238 149*  --  145*     Recent Labs  Lab 04/02/17 1557  TROPIPOC 0.01      Radiology    Ct Angio Chest Pe W Or Wo Contrast  Result Date: 04/07/2017 CLINICAL DATA:  Shortness of breath and DVT. History of hypertension. Anemia. PE suspected, high pretest probability EXAM: CT ANGIOGRAPHY CHEST WITH CONTRAST TECHNIQUE: Multidetector CT imaging of the chest was performed using the standard protocol during bolus administration of intravenous contrast. Multiplanar CT image reconstructions and MIPs were obtained to evaluate the vascular anatomy. CONTRAST:  160mL ISOVUE-370 IOPAMIDOL (ISOVUE-370) INJECTION 76% COMPARISON:  Chest x-ray dated 04/03/2017. FINDINGS: Cardiovascular: Some of the most peripheral segmental and subsegmental pulmonary arteries are difficult to definitively characterize due to patient breathing motion artifact, however, there is no pulmonary embolism identified within the main, lobar or segmental pulmonary arteries bilaterally. No thoracic aortic aneurysm or evidence of dissection. Heart size is upper normal. No pericardial effusion. Mediastinum/Nodes: Soft tissue mass within the aortopulmonary window region of the mediastinum, measuring 4.1 cm greatest dimension.  Scattered small lymph nodes elsewhere within the mediastinum. Esophagus is unremarkable. Trachea and central bronchi are unremarkable. Lungs/Pleura: Small right pleural effusion. Mild bibasilar atelectasis. Mild emphysematous changes within the lung apices. Upper Abdomen: Limited images of the upper abdomen are unremarkable. Musculoskeletal: Mild degenerative spurring within the thoracic spine. No acute or suspicious osseous finding. Review of the MIP images confirms the above findings. IMPRESSION: 1. 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 Recommend tissue sampling. 2. No pulmonary embolism seen, with mild study limitations detailed above. 3. Small right pleural effusion.  Mild bibasilar atelectasis. 4. Mild emphysematous changes within the lung apices. Emphysema (ICD10-J43.9). These results will be called to the ordering clinician or representative by the Radiologist Assistant, and communication documented in the PACS or zVision Dashboard. Electronically Signed   By: Franki Cabot M.D.   On: 04/07/2017 23:26   Nm Gi Blood Loss  Result Date: 04/07/2017 CLINICAL DATA:  62 year old with acute onset of gastrointestinal bleeding approximately 12 hours ago. Technologist states the patient had 2 episodes of passage of dark red blood during the examination. EXAM: NUCLEAR MEDICINE GASTROINTESTINAL BLEEDING SCAN TECHNIQUE: Sequential abdominal images out to 2 hours were obtained following intravenous administration of Tc-16m labeled red blood cells. RADIOPHARMACEUTICALS:  25.1 mCi Tc-12m pertechnetate in-vitro labeled red cells. COMPARISON:  No prior nuclear imaging. CTA abdomen pelvis 04/03/2017 is correlated. FINDINGS: No abnormal activity in the abdomen or pelvis to be able to identify an active source of bleeding. Expected blood pool activity and hepatic and splenic activity is present. Expected excretion into the urinary tract with  faint visualization of the bladder in the pelvis. IMPRESSION: No evidence of active gastrointestinal bleeding. Electronically Signed   By: Evangeline Dakin M.D.   On: 04/07/2017 20:52    Patient Profile     62 year old female with past medical history of hypertension and hyperlipidemia admitted with a GI bleed for evaluation of paroxysmal atrial fibrillation.  Patient admitted on January 24 with GI bleed.  EGD showed gastric ulcer which was clipped.  She has been noted to have paroxysmal atrial fibrillation on telemetry and cardiology asked to evaluate. Echo shows normal LV function, mild LAE and mild mitral regurgitation.    Assessment & Plan    1 paroxysmal atrial fibrillation-patient in sinus this AM; amiodarone DCed due to HR in 40s; increases to 60s when awake; can resume if atrial fibrillation recurs; will continue low dose  metoprolol for now; cannot anticoagulate given GI bleed. We will consider in the future when GI bleed improves. TSH normal.  LV function is normal.  2 GI bleed-for EGD and colonoscopy per GI  3 vascular disease-noted on abdominal CTA.  This will be followed up as an outpatient.  4 DVT-pt with gastrocnemius DVT; no proximal DVT; as outlined previously, will need to follow closely as pt has active GI bleed and cannot be anticoagulated, remains fairly immobile and has possible malignancy on chest CT increasing risk of hypercoaguable state. Would consider IVC filter; management per IM.  5 Mediastinal mass-fu CVTS following DC.  For questions or updates, please contact [place] Please consult [email or web address] for contact info under Cardiology/STEMI.      Signed, Kirk Ruths, MD  04/09/2017, 7:50 AM

## 2017-04-09 NOTE — Interval H&P Note (Signed)
History and Physical Interval Note:  04/09/2017 12:48 PM  Kayla Wyatt  has presented today for surgery, with the diagnosis of Blood in stool and anemia  The various methods of treatment have been discussed with the patient and family. After consideration of risks, benefits and other options for treatment, the patient has consented to  Procedure(s): COLONOSCOPY (Left) as a surgical intervention .  The patient's history has been reviewed, patient examined, no change in status, stable for surgery.  I have reviewed the patient's chart and labs.  Questions were answered to the patient's satisfaction.     [name]

## 2017-04-09 NOTE — Progress Notes (Signed)
   04/09/17 1010  Vitals  BP (!) 136/47  Pulse Rate (!) 54  ECG Heart Rate (!) 57  Resp 16   Held Lopressor due to low HR

## 2017-04-10 ENCOUNTER — Encounter (HOSPITAL_COMMUNITY): Payer: Self-pay | Admitting: Gastroenterology

## 2017-04-10 LAB — CBC WITH DIFFERENTIAL/PLATELET
Basophils Absolute: 0.1 10*3/uL (ref 0.0–0.1)
Basophils Relative: 1 %
Eosinophils Absolute: 0.2 10*3/uL (ref 0.0–0.7)
Eosinophils Relative: 2 %
HCT: 25.3 % — ABNORMAL LOW (ref 36.0–46.0)
Hemoglobin: 8.4 g/dL — ABNORMAL LOW (ref 12.0–15.0)
Lymphocytes Relative: 17 %
Lymphs Abs: 1.8 10*3/uL (ref 0.7–4.0)
MCH: 32.1 pg (ref 26.0–34.0)
MCHC: 33.2 g/dL (ref 30.0–36.0)
MCV: 96.6 fL (ref 78.0–100.0)
Monocytes Absolute: 0.5 10*3/uL (ref 0.1–1.0)
Monocytes Relative: 5 %
Neutro Abs: 7.8 10*3/uL — ABNORMAL HIGH (ref 1.7–7.7)
Neutrophils Relative %: 75 %
Platelets: 166 10*3/uL (ref 150–400)
RBC: 2.62 MIL/uL — ABNORMAL LOW (ref 3.87–5.11)
RDW: 18.2 % — ABNORMAL HIGH (ref 11.5–15.5)
WBC: 10.3 10*3/uL (ref 4.0–10.5)

## 2017-04-10 LAB — BASIC METABOLIC PANEL
Anion gap: 7 (ref 5–15)
BUN: 6 mg/dL (ref 6–20)
CO2: 20 mmol/L — ABNORMAL LOW (ref 22–32)
Calcium: 7.7 mg/dL — ABNORMAL LOW (ref 8.9–10.3)
Chloride: 116 mmol/L — ABNORMAL HIGH (ref 101–111)
Creatinine, Ser: 0.62 mg/dL (ref 0.44–1.00)
GFR calc Af Amer: >60 mL/min (ref >60)
GFR calc non Af Amer: >60 mL/min (ref >60)
Glucose, Bld: 95 mg/dL (ref 65–99)
Potassium: 3.6 mmol/L (ref 3.5–5.1)
Sodium: 143 mmol/L (ref 135–145)

## 2017-04-10 LAB — MAGNESIUM: Magnesium: 1.9 mg/dL (ref 1.7–2.4)

## 2017-04-10 MED ORDER — RIVAROXABAN 20 MG PO TABS: 20.0000 mg | ORAL_TABLET | Freq: Every day | ORAL | Status: DC

## 2017-04-10 MED ORDER — RIVAROXABAN 15 MG PO TABS
15.0000 mg | ORAL_TABLET | Freq: Two times a day (BID) | ORAL | Status: DC
  Administered 2017-04-10 – 2017-04-11 (×2): 15 mg via ORAL
  Filled 2017-04-10 (×3): qty 1

## 2017-04-10 NOTE — Progress Notes (Signed)
ANTICOAGULATION CONSULT NOTE - Initial Consult  Pharmacy Consult for rivaroxaban Indication: DVT - new and Afib  No Known Allergies  Patient Measurements: Height: 5\' 3"  (160 cm) Weight: 159 lb 9.8 oz (72.4 kg) IBW/kg (Calculated) : 52.4   Vital Signs: Temp: 98.3 F (36.8 C) (02/01 1600) Temp Source: Oral (02/01 1600) BP: 138/57 (02/01 1600) Pulse Rate: 66 (02/01 1600)  Labs: Recent Labs    04/08/17 0248 04/08/17 1211 04/09/17 0255 04/10/17 0243 04/10/17 0715  HGB 8.8* 8.5* 7.9*  --  8.4*  HCT 24.6* 24.6* 23.7*  --  25.3*  PLT 149*  --  145*  --  166  CREATININE 0.55  --  0.56 0.62  --     Estimated Creatinine Clearance: 67.7 mL/min (by C-G formula based on SCr of 0.62 mg/dL).   Medical History: Past Medical History:  Diagnosis Date  . Acute blood loss anemia 04/02/2017  . Gastrointestinal hemorrhage with melena   . Hypercholesteremia   . Hypertension   . Persistent atrial fibrillation with rapid ventricular response ([place]) 04/07/2017  . Vitamin D deficiency       Assessment: 63yof with new RLE DVT and Afib but was holding on anticoagulation with GI bleeding.  S/p EGD with gastric ulcer clipped and rectal bleeding but no source found on colonoscopy.  H/H low - watch - start anticoagulation  - GI ok with it.    Goal of Therapy:   Monitor platelets by anticoagulation protocol: Yes   Plan:  Rivaroxaban 15mg  BID x21 days then 20mg  daily Monitor s/s bleeding  Bonnita Nasuti Pharm.D. CPP, BCPS Clinical Pharmacist [phone] 04/10/2017 6:12 PM

## 2017-04-10 NOTE — Progress Notes (Signed)
Progress Note  Patient Name: Kayla Wyatt Date of Encounter: 04/10/2017  Primary Cardiologist: Dr Stanford Breed  Subjective   Pt denies CP or dyspnea; no abdominal pain  Inpatient Medications    Scheduled Meds: . feeding supplement (ENSURE ENLIVE)  237 mL Oral BID BM  . Gerhardt's butt cream   Topical TID  . metoprolol tartrate  12.5 mg Oral BID  . [START ON 04/11/2017] pantoprazole  40 mg Intravenous Q12H  . simvastatin  20 mg Oral Daily  . sodium chloride flush  3 mL Intravenous Q12H   Continuous Infusions: . sodium chloride 1,000 mL (04/10/17 0524)  . cefTRIAXone (ROCEPHIN)  IV Stopped (04/09/17 1458)  . pantoprozole (PROTONIX) infusion 8 mg/hr (04/10/17 0132)   PRN Meds: acetaminophen **OR** acetaminophen, nicotine, ondansetron **OR** ondansetron (ZOFRAN) IV   Vital Signs    Vitals:   04/09/17 1937 04/09/17 2139 04/10/17 0000 04/10/17 0400  BP: (!) 142/56 (!) 139/56 (!) 125/51   Pulse: 77 68 (!) 54   Resp: (!) 27     Temp: 98.4 F (36.9 C)  98.5 F (36.9 C) 98.3 F (36.8 C)  TempSrc: Oral  Oral Oral  SpO2: 100%  100%   Weight:    159 lb 9.8 oz (72.4 kg)  Height:        Intake/Output Summary (Last 24 hours) at 04/10/2017 0752 Last data filed at 04/10/2017 0400 Gross per 24 hour  Intake 2293 ml  Output 1175 ml  Net 1118 ml   Filed Weights   04/08/17 0500 04/09/17 0523 04/10/17 0400  Weight: 155 lb 10.3 oz (70.6 kg) 159 lb 13.3 oz (72.5 kg) 159 lb 9.8 oz (72.4 kg)    Telemetry    Sinus bradycardia ( HR in 40s when asleep) - Personally Reviewed   Physical Exam   GEN: NAD Neck: Supple, no JVD Cardiac: RRR, no mumur Respiratory: CTA; no wheeze GI: Soft, NT ND, no masses palpated  MS: no edema Neuro:  no focal findings   Labs    Chemistry Recent Labs  Lab 04/05/17 1504 04/07/17 1006 04/08/17 0248 04/09/17 0255 04/10/17 0243  NA  --  144 144 143 143  K  --  3.3* 3.3* 3.7 3.6  CL  --  113* 114* 115* 116*  CO2  --  24 22 23  20*  GLUCOSE  --   109* 113* 105* 95  BUN  --  17 10 7 6   CREATININE  --  0.51 0.55 0.56 0.62  CALCIUM  --  7.7* 7.3* 7.6* 7.7*  PROT 5.2* 4.4* 4.0*  --   --   ALBUMIN 2.2* 1.9* 1.9*  --   --   AST 47* 24 20  --   --   ALT 70* 40 33  --   --   ALKPHOS 64 51 42  --   --   BILITOT 0.5 0.3 0.8  --   --   GFRNONAA  --  >60 >60 >60 >60  GFRAA  --  >60 >60 >60 >60  ANIONGAP  --  7 8 5 7      Hematology Recent Labs  Lab 04/07/17 1006 04/08/17 0248 04/08/17 1211 04/09/17 0255  WBC 18.5* 21.1*  --  15.2*  RBC 1.93* 2.73*  --  2.53*  HGB 6.0* 8.8* 8.5* 7.9*  HCT 17.8* 24.6* 24.6* 23.7*  MCV 92.2 90.1  --  93.7  MCH 31.1 32.2  --  31.2  MCHC 33.7 35.8  --  33.3  RDW 15.5  14.3  --  16.2*  PLT 238 149*  --  145*    Patient Profile     62 year old female with past medical history of hypertension and hyperlipidemia admitted with a GI bleed for evaluation of paroxysmal atrial fibrillation.  Patient admitted on January 24 with GI bleed.  EGD showed gastric ulcer which was clipped.  She has been noted to have paroxysmal atrial fibrillation on telemetry and cardiology asked to evaluate. Echo shows normal LV function, mild LAE and mild mitral regurgitation.    Assessment & Plan    1 paroxysmal atrial fibrillation-patient remains in sinus this AM; amiodoarone previously dced due to bradycardia; HR in 40s when asleep but 60-70 when awake; continue low dose metoprolol; cannot anticoagulate given GI bleed. We will consider in the future when this improves. TSH normal.  LV function is normal.  2 GI bleed-felt secondary to visible vessel in stomach ulcer (clipped). Colonoscopy showed diverticulosis.  3 vascular disease-noted on abdominal CTA.  This will be followed up as an outpatient.  4 DVT-pt with gastrocnemius DVT; no proximal DVT; as outlined previously, will need to follow closely as pt has active GI bleed and cannot be anticoagulated, remains fairly immobile and has possible malignancy on chest CT increasing  risk of hypercoaguable state. IM managing.  5 Mediastinal mass-fu CVTS following DC.  For questions or updates, please contact [place] Please consult [email or web address] for contact info under Cardiology/STEMI.      Signed, Kirk Ruths, MD  04/10/2017, 7:52 AM

## 2017-04-10 NOTE — Progress Notes (Signed)
Patient ID: Kayla Wyatt, female   DOB: 1955-06-01, 62 y.o.   MRN: [id number]  PROGRESS NOTE    Kayla Wyatt  VPX:[id number] DOB: 05-08-1955 DOA: 04/02/2017 PCP: [name], MD  Brief Narrative: 62 year old female with history of hypertension and hyperlipidemia presented on 04/02/2017 with abdominal pain and diarrhea.  Her hemoglobin was 6.4.  She was transfused packed red cells.  GI was consulted.  EGD showed gastric ulcer with visible vessel that was clipped.  Patient developed new onset A. fib with RVR for which she was initially put on Cardizem drip which was switched to amiodarone drip.  Cardiology was consulted.  Patient was found to have right lower extremity DVT.  Patient is still having intermittent rectal bleed.    Colonoscopy on January 31.  Patient reports no bleeding in 48 hours    Assessment & Plan:   Principal Problem:   Gastrointestinal hemorrhage with melena-secondary to gastric ulcer upper GI bleed.  Status post 5 units packed red blood cells this admission.  Reports no bleeding in almost 48 hours.  Hemoglobin stable.  Will up on colonoscopy results.  Bleeding scan negative on January 29.  Continue Protonix drip.  Active Problems:   Acute blood loss anemia-seems to have stopped    Lower abdominal pain of unknown etiology-resolved    Abnormal computed tomography angiography (CTA) of abdomen and pelvis-    Persistent atrial fibrillation with rapid ventricular response (HCC)-was loaded with amiodarone but was stopped due to bradycardia.  Continue metoprolol no anticoagulation at this time due to above issues  New right lower extremity DVT -vascular surgery was called yesterday for IVC filter placement.  Waiting on GI recommendations and colonoscopy findings.  Will check with GI to see if it is safe to start anticoagulation in the next 24-48 hours if no further bleeding recurs.  UTI-continue Rocephin culture with E. Coli  Incidental finding on CT chest-cardiothoracic  outpatient follow-up at discharge.  Will need outpatient workup.       DVT prophylaxis: SCDs Code Status: Full  family Communication: None  disposition Plan: Pending overall hospital course  Consultants:   GI/cardiology/gynecology/cardiothoracic surgery/vascular surgery   Procedures: EGd colonoscopy check echo    Subjective: Patient is feeling better.  She has not had any further bleeding.  Objective: Vitals:   04/09/17 2139 04/10/17 0000 04/10/17 0400 04/10/17 0800  BP: (!) 139/56 (!) 125/51  (!) 152/53  Pulse: 68 (!) 54  66  Resp:    20  Temp:  98.5 F (36.9 C) 98.3 F (36.8 C) 97.6 F (36.4 C)  TempSrc:  Oral Oral Oral  SpO2:  100%  94%  Weight:   72.4 kg (159 lb 9.8 oz)   Height:        Intake/Output Summary (Last 24 hours) at 04/10/2017 1056 Last data filed at 04/10/2017 0852 Gross per 24 hour  Intake 3088 ml  Output 1275 ml  Net 1813 ml   Filed Weights   04/08/17 0500 04/09/17 0523 04/10/17 0400  Weight: 70.6 kg (155 lb 10.3 oz) 72.5 kg (159 lb 13.3 oz) 72.4 kg (159 lb 9.8 oz)    Examination:  General exam: Appears calm and comfortable  Respiratory system: Clear to auscultation. Respiratory effort normal. Cardiovascular system: S1 & S2 heard, RRR. No JVD, murmurs, rubs, gallops or clicks. No pedal edema. Gastrointestinal system: Abdomen is nondistended, soft and nontender. No organomegaly or masses felt. Normal bowel sounds heard. Central nervous system: Alert and oriented. No focal neurological deficits. Extremities: Symmetric 5  x 5 power. Skin: No rashes, lesions or ulcers Psychiatry: Judgement and insight appear normal. Mood & affect appropriate.     Data Reviewed: I have personally reviewed following labs and imaging studies  CBC: Recent Labs  Lab 04/06/17 0211 04/07/17 1006 04/08/17 0248 04/08/17 1211 04/09/17 0255 04/10/17 0715  WBC 13.7* 18.5* 21.1*  --  15.2* 10.3  NEUTROABS  --   --   --   --  11.6* 7.8*  HGB 8.1* 6.0* 8.8* 8.5*  7.9* 8.4*  HCT 24.3* 17.8* 24.6* 24.6* 23.7* 25.3*  MCV 91.0 92.2 90.1  --  93.7 96.6  PLT 262 238 149*  --  145* 932   Basic Metabolic Panel: Recent Labs  Lab 04/05/17 0447 04/07/17 1006 04/08/17 0248 04/09/17 0255 04/10/17 0243  NA 145 144 144 143 143  K 4.0 3.3* 3.3* 3.7 3.6  CL 115* 113* 114* 115* 116*  CO2 21* 24 22 23  20*  GLUCOSE 104* 109* 113* 105* 95  BUN 12 17 10 7 6   CREATININE 0.60 0.51 0.55 0.56 0.62  CALCIUM 7.9* 7.7* 7.3* 7.6* 7.7*  MG  --   --   --  1.6* 1.9   GFR: Estimated Creatinine Clearance: 67.7 mL/min (by C-G formula based on SCr of 0.62 mg/dL). Liver Function Tests: Recent Labs  Lab 04/05/17 1504 04/07/17 1006 04/08/17 0248  AST 47* 24 20  ALT 70* 40 33  ALKPHOS 64 51 42  BILITOT 0.5 0.3 0.8  PROT 5.2* 4.4* 4.0*  ALBUMIN 2.2* 1.9* 1.9*   No results for input(s): LIPASE, AMYLASE in the last 168 hours. No results for input(s): AMMONIA in the last 168 hours. Coagulation Profile: No results for input(s): INR, PROTIME in the last 168 hours. Cardiac Enzymes: No results for input(s): CKTOTAL, CKMB, CKMBINDEX, TROPONINI in the last 168 hours. BNP (last 3 results) No results for input(s): PROBNP in the last 8760 hours. HbA1C: No results for input(s): HGBA1C in the last 72 hours. CBG: No results for input(s): GLUCAP in the last 168 hours. Lipid Profile: No results for input(s): CHOL, HDL, LDLCALC, TRIG, CHOLHDL, LDLDIRECT in the last 72 hours. Thyroid Function Tests: Recent Labs    04/08/17 0248  TSH 2.440   Anemia Panel: No results for input(s): VITAMINB12, FOLATE, FERRITIN, TIBC, IRON, RETICCTPCT in the last 72 hours. Sepsis Labs: Recent Labs  Lab 04/06/17 0914  PROCALCITON 0.13    Recent Results (from the past 240 hour(s))  Culture, blood (Routine x 2)     Status: Abnormal   Collection Time: 04/02/17  3:40 PM  Result Value Ref Range Status   Specimen Description BLOOD RIGHT FOREARM  Final   Special Requests   Final    BOTTLES  DRAWN AEROBIC AND ANAEROBIC Blood Culture adequate volume   Culture  Setup Time   Final    GRAM NEGATIVE RODS AEROBIC BOTTLE ONLY CRITICAL RESULT CALLED TO, READ BACK BY AND VERIFIED WITH: JCARNEY,PHARMD @ 2219 04/03/17 BY LHOWARD    Culture ESCHERICHIA COLI (A)  Final   Report Status 04/05/2017 FINAL  Final   Organism ID, Bacteria ESCHERICHIA COLI  Final      Susceptibility   Escherichia coli - MIC*    AMPICILLIN <=2 SENSITIVE Sensitive     CEFAZOLIN <=4 SENSITIVE Sensitive     CEFEPIME <=1 SENSITIVE Sensitive     CEFTAZIDIME <=1 SENSITIVE Sensitive     CEFTRIAXONE <=1 SENSITIVE Sensitive     CIPROFLOXACIN <=0.25 SENSITIVE Sensitive     GENTAMICIN <=1  SENSITIVE Sensitive     IMIPENEM <=0.25 SENSITIVE Sensitive     TRIMETH/SULFA <=20 SENSITIVE Sensitive     AMPICILLIN/SULBACTAM <=2 SENSITIVE Sensitive     PIP/TAZO <=4 SENSITIVE Sensitive     Extended ESBL NEGATIVE Sensitive     * ESCHERICHIA COLI  Blood Culture ID Panel (Reflexed)     Status: Abnormal   Collection Time: 04/02/17  3:40 PM  Result Value Ref Range Status   Enterococcus species NOT DETECTED NOT DETECTED Final   Listeria monocytogenes NOT DETECTED NOT DETECTED Final   Staphylococcus species NOT DETECTED NOT DETECTED Final   Staphylococcus aureus NOT DETECTED NOT DETECTED Final   Streptococcus species NOT DETECTED NOT DETECTED Final   Streptococcus agalactiae NOT DETECTED NOT DETECTED Final   Streptococcus pneumoniae NOT DETECTED NOT DETECTED Final   Streptococcus pyogenes NOT DETECTED NOT DETECTED Final   Acinetobacter baumannii NOT DETECTED NOT DETECTED Final   Enterobacteriaceae species DETECTED (A) NOT DETECTED Final    Comment: Enterobacteriaceae represent a large family of gram-negative bacteria, not a single organism. CRITICAL RESULT CALLED TO, READ BACK BY AND VERIFIED WITH: JCARNEY,PHARMD @2219  04/03/17 BY LHOWARD    Enterobacter cloacae complex NOT DETECTED NOT DETECTED Final   Escherichia coli DETECTED  (A) NOT DETECTED Final    Comment: CRITICAL RESULT CALLED TO, READ BACK BY AND VERIFIED WITH: JCARNEY,PHARMD @2219  04/03/17 BY LHOWARD    Klebsiella oxytoca NOT DETECTED NOT DETECTED Final   Klebsiella pneumoniae NOT DETECTED NOT DETECTED Final   Proteus species NOT DETECTED NOT DETECTED Final   Serratia marcescens NOT DETECTED NOT DETECTED Final   Carbapenem resistance NOT DETECTED NOT DETECTED Final   Haemophilus influenzae NOT DETECTED NOT DETECTED Final   Neisseria meningitidis NOT DETECTED NOT DETECTED Final   Pseudomonas aeruginosa NOT DETECTED NOT DETECTED Final   Candida albicans NOT DETECTED NOT DETECTED Final   Candida glabrata NOT DETECTED NOT DETECTED Final   Candida krusei NOT DETECTED NOT DETECTED Final   Candida parapsilosis NOT DETECTED NOT DETECTED Final   Candida tropicalis NOT DETECTED NOT DETECTED Final  Culture, blood (Routine x 2)     Status: None   Collection Time: 04/02/17  3:44 PM  Result Value Ref Range Status   Specimen Description BLOOD RIGHT FOREARM  Final   Special Requests   Final    BOTTLES DRAWN AEROBIC AND ANAEROBIC Blood Culture adequate volume   Culture NO GROWTH 5 DAYS  Final   Report Status 04/07/2017 FINAL  Final  Urine culture     Status: Abnormal   Collection Time: 04/02/17  5:05 PM  Result Value Ref Range Status   Specimen Description URINE, RANDOM  Final   Special Requests NONE  Final   Culture MULTIPLE SPECIES PRESENT, SUGGEST RECOLLECTION (A)  Final   Report Status 04/03/2017 FINAL  Final  MRSA PCR Screening     Status: None   Collection Time: 04/02/17  8:43 PM  Result Value Ref Range Status   MRSA by PCR NEGATIVE NEGATIVE Final    Comment:        The GeneXpert MRSA Assay (FDA approved for NASAL specimens only), is one component of a comprehensive MRSA colonization surveillance program. It is not intended to diagnose MRSA infection nor to guide or monitor treatment for MRSA infections.   Culture, blood (routine x 2)      Status: None   Collection Time: 04/04/17  3:16 PM  Result Value Ref Range Status   Specimen Description BLOOD LEFT ANTECUBITAL  Final  Special Requests IN PEDIATRIC BOTTLE Blood Culture adequate volume  Final   Culture NO GROWTH 5 DAYS  Final   Report Status 04/09/2017 FINAL  Final  Culture, blood (routine x 2)     Status: None   Collection Time: 04/04/17  3:19 PM  Result Value Ref Range Status   Specimen Description BLOOD LEFT HAND  Final   Special Requests IN PEDIATRIC BOTTLE Blood Culture adequate volume  Final   Culture NO GROWTH 5 DAYS  Final   Report Status 04/09/2017 FINAL  Final  Culture, Urine     Status: None   Collection Time: 04/05/17 12:19 PM  Result Value Ref Range Status   Specimen Description URINE, RANDOM  Final   Special Requests NONE  Final   Culture NO GROWTH  Final   Report Status 04/06/2017 FINAL  Final         Radiology Studies: No results found.      Scheduled Meds: . feeding supplement (ENSURE ENLIVE)  237 mL Oral BID BM  . Gerhardt's butt cream   Topical TID  . metoprolol tartrate  12.5 mg Oral BID  . [START ON 04/11/2017] pantoprazole  40 mg Intravenous Q12H  . simvastatin  20 mg Oral Daily  . sodium chloride flush  3 mL Intravenous Q12H   Continuous Infusions: . sodium chloride 1,000 mL (04/10/17 0524)  . cefTRIAXone (ROCEPHIN)  IV Stopped (04/09/17 1458)  . pantoprozole (PROTONIX) infusion 8 mg/hr (04/10/17 0132)     LOS: 8 days    Time spent: 25 minutes    Kyler Germer A, MD [facility] Pager [phone]  If 7PM-7AM, please contact night-coverage [email or web address] Password [facility] 04/10/2017, 10:56 AM

## 2017-04-10 NOTE — Progress Notes (Signed)
Audrie Kuri is a 62 y.o. female patient admitted from ED awake, alert - oriented  X 4 - no acute distress noted.  VSS - Blood pressure (!) 160/58, pulse 66, temperature 98.4 F (36.9 C), temperature source Oral, resp. rate 15, height 5\' 3"  (1.6 m), weight 72.4 kg (159 lb 9.8 oz), SpO2 93 %.    IV in place, occlusive dsg intact without redness.  Orientation to room, and floor completed with information packet given to patient/family.  Patient declined safety video at this time.  Admission INP armband ID verified with patient/family, and in place.   SR up x 2, fall assessment complete, with patient and family able to verbalize understanding of risk associated with falls, and verbalized understanding to call nsg before up out of bed.  Call light within reach, patient able to voice, and demonstrate understanding.  Skin, clean-dry- intact without evidence of bruising, or skin tears.   No evidence of skin break down noted on exam.     Will cont to eval and treat per MD orders.  Holley Raring, RN 04/10/2017 7:50 PM

## 2017-04-10 NOTE — Care Management Note (Signed)
Case Management Note  Patient Details  Name: Kayla Wyatt MRN: [id number] Date of Birth: 1955-11-10  Subjective/Objective:  Pt admitted with abd pain - GI bleed and now with A fib RVR                  Action/Plan:  PTA independent from home.  Pt now on Cardizem drip.  CM will continue to follow for discharge  needs   Expected Discharge Date:                  Expected Discharge Plan:  Home/Self Care(independent from home)  In-House Referral:     Discharge planning Services  CM Consult  Post Acute Care Choice:    Choice offered to:     DME Arranged:    DME Agency:     HH Arranged:    HH Agency:     Status of Service:     If discussed at [employer], dates discussed:    Additional Comments: Pt had colonoscopy performed yesterday.  Pt is still on IV antibiotics and IV protonix Maryclare Labrador, RN 04/10/2017, 9:45 AM

## 2017-04-10 NOTE — Progress Notes (Signed)
Subjective: Feeling well.  She did report some minor hematuria and she has a "raw sensation down there".  Objective: Vital signs in last 24 hours: Temp:  [97.4 F (36.3 C)-98.5 F (36.9 C)] 97.7 F (36.5 C) (02/01 1101) Pulse Rate:  [54-77] 58 (02/01 1106) Resp:  [18-27] 25 (02/01 1106) BP: (125-152)/(40-58) 147/40 (02/01 1106) SpO2:  [94 %-100 %] 96 % (02/01 1106) Weight:  [72.4 kg (159 lb 9.8 oz)] 72.4 kg (159 lb 9.8 oz) (02/01 0400) Last BM Date: 04/09/17  Intake/Output from previous day: 01/31 0701 - 02/01 0700 In: 2293 [P.O.:240; I.V.:1955; IV Piggyback:100] Out: 1175 [Urine:1175] Intake/Output this shift: Total I/O In: 900 [P.O.:600; I.V.:300] Out: 250 [Urine:250]  General appearance: alert and no distress GI: soft, non-tender; bowel sounds normal; no masses,  no organomegaly  Lab Results: Recent Labs    04/08/17 0248 04/08/17 1211 04/09/17 0255 04/10/17 0715  WBC 21.1*  --  15.2* 10.3  HGB 8.8* 8.5* 7.9* 8.4*  HCT 24.6* 24.6* 23.7* 25.3*  PLT 149*  --  145* 166   BMET Recent Labs    04/08/17 0248 04/09/17 0255 04/10/17 0243  NA 144 143 143  K 3.3* 3.7 3.6  CL 114* 115* 116*  CO2 22 23 20*  GLUCOSE 113* 105* 95  BUN 10 7 6   CREATININE 0.55 0.56 0.62  CALCIUM 7.3* 7.6* 7.7*   LFT Recent Labs    04/08/17 0248  PROT 4.0*  ALBUMIN 1.9*  AST 20  ALT 33  ALKPHOS 42  BILITOT 0.8   PT/INR No results for input(s): LABPROT, INR in the last 72 hours. Hepatitis Panel No results for input(s): HEPBSAG, HCVAB, HEPAIGM, HEPBIGM in the last 72 hours. C-Diff No results for input(s): CDIFFTOX in the last 72 hours. Fecal Lactopherrin No results for input(s): FECLLACTOFRN in the last 72 hours.  Studies/Results: No results found.  Medications:  Scheduled: . feeding supplement (ENSURE ENLIVE)  237 mL Oral BID BM  . Gerhardt's butt cream   Topical TID  . metoprolol tartrate  12.5 mg Oral BID  . [START ON 04/11/2017] pantoprazole  40 mg Intravenous Q12H   . simvastatin  20 mg Oral Daily  . sodium chloride flush  3 mL Intravenous Q12H   Continuous: . sodium chloride 1,000 mL (04/10/17 0524)  . cefTRIAXone (ROCEPHIN)  IV Stopped (04/09/17 1458)    Assessment/Plan: 1) Hematochezia. 2) Anemia. 3) DVT.   The lower source of bleeding is unknown.  With the clot in her leg she needs to be anticoagulated and she can start on a medication, per Hospitalist.  This can induce bleeding, but the risks of being off of a blood thinner are greater than not being on any anticoagulation.  Plan: 1) Start anticoagulation. 2) Reconsult if bleeding recurs.  LOS: 8 days   Laketra Bowdish D 04/10/2017, 2:36 PM

## 2017-04-10 NOTE — Plan of Care (Signed)
Continue current care plan 

## 2017-04-10 NOTE — Progress Notes (Signed)
PT Cancellation Note  Patient Details Name: Kayla Wyatt MRN: [id number] DOB: 10-15-1955   Cancelled Treatment:    Reason Eval/Treat Not Completed: Patient not medically ready. Pt with RLE DVT and not on anti-coagulation due to GI bleed. Per verbal discussion with Dr. Shanon Brow: do not mobilize pt, but pt ok to dangle BLEs at EOB.   Please reorder PT when pt safe to mobilize.  Mabeline Caras, PT, DPT Acute Rehab Services  Pager: [phone] 04/10/2017, 2:25 PM

## 2017-04-11 DIAGNOSIS — R1030 Lower abdominal pain, unspecified: Secondary | ICD-10-CM

## 2017-04-11 DIAGNOSIS — Z7901 Long term (current) use of anticoagulants: Secondary | ICD-10-CM

## 2017-04-11 LAB — CBC WITH DIFFERENTIAL/PLATELET
Basophils Absolute: 0 10*3/uL (ref 0.0–0.1)
Basophils Relative: 0 %
Eosinophils Absolute: 0.1 10*3/uL (ref 0.0–0.7)
Eosinophils Relative: 1 %
HCT: 24 % — ABNORMAL LOW (ref 36.0–46.0)
Hemoglobin: 7.8 g/dL — ABNORMAL LOW (ref 12.0–15.0)
Lymphocytes Relative: 22 %
Lymphs Abs: 1.9 10*3/uL (ref 0.7–4.0)
MCH: 31.8 pg (ref 26.0–34.0)
MCHC: 32.5 g/dL (ref 30.0–36.0)
MCV: 98 fL (ref 78.0–100.0)
Monocytes Absolute: 0.3 10*3/uL (ref 0.1–1.0)
Monocytes Relative: 4 %
Neutro Abs: 6.5 10*3/uL (ref 1.7–7.7)
Neutrophils Relative %: 73 %
Platelets: 176 10*3/uL (ref 150–400)
RBC: 2.45 MIL/uL — ABNORMAL LOW (ref 3.87–5.11)
RDW: 18.8 % — ABNORMAL HIGH (ref 11.5–15.5)
WBC: 8.8 10*3/uL (ref 4.0–10.5)

## 2017-04-11 LAB — TYPE AND SCREEN
ABO/RH(D): AB POS
Antibody Screen: NEGATIVE
Unit division: 0
Unit division: 0
Unit division: 0
Unit division: 0

## 2017-04-11 LAB — BPAM RBC
Blood Product Expiration Date: 201902042359
Blood Product Expiration Date: 201902042359
Blood Product Expiration Date: 201902062359
Blood Product Expiration Date: 201902092359
ISSUE DATE / TIME: 201901291229
ISSUE DATE / TIME: 201901291341
ISSUE DATE / TIME: 201901292156
Unit Type and Rh: 600
Unit Type and Rh: 8400
Unit Type and Rh: 9500
Unit Type and Rh: 9500

## 2017-04-11 LAB — BASIC METABOLIC PANEL
Anion gap: 7 (ref 5–15)
BUN: 5 mg/dL — ABNORMAL LOW (ref 6–20)
CO2: 23 mmol/L (ref 22–32)
Calcium: 8.1 mg/dL — ABNORMAL LOW (ref 8.9–10.3)
Chloride: 113 mmol/L — ABNORMAL HIGH (ref 101–111)
Creatinine, Ser: 0.6 mg/dL (ref 0.44–1.00)
GFR calc Af Amer: >60 mL/min (ref >60)
GFR calc non Af Amer: >60 mL/min (ref >60)
Glucose, Bld: 109 mg/dL — ABNORMAL HIGH (ref 65–99)
Potassium: 3.1 mmol/L — ABNORMAL LOW (ref 3.5–5.1)
Sodium: 143 mmol/L (ref 135–145)

## 2017-04-11 LAB — PREPARE RBC (CROSSMATCH)

## 2017-04-11 MED ORDER — SODIUM CHLORIDE 0.9 % IV BOLUS (SEPSIS)
1000.0000 mL | Freq: Once | INTRAVENOUS | Status: AC
  Administered 2017-04-11: 1000 mL via INTRAVENOUS

## 2017-04-11 MED ORDER — FUROSEMIDE 10 MG/ML IJ SOLN
20.0000 mg | Freq: Once | INTRAMUSCULAR | Status: AC
  Administered 2017-04-11: 20 mg via INTRAVENOUS
  Filled 2017-04-11: qty 2

## 2017-04-11 MED ORDER — SODIUM CHLORIDE 0.9 % IV SOLN
INTRAVENOUS | Status: DC
  Administered 2017-04-11: 23:00:00 via INTRAVENOUS

## 2017-04-11 MED ORDER — SODIUM CHLORIDE 0.9 % IV SOLN
Freq: Once | INTRAVENOUS | Status: AC
  Administered 2017-04-11: 18:00:00 via INTRAVENOUS

## 2017-04-11 MED ORDER — POTASSIUM CHLORIDE CRYS ER 20 MEQ PO TBCR
40.0000 meq | EXTENDED_RELEASE_TABLET | Freq: Once | ORAL | Status: AC
  Administered 2017-04-11: 40 meq via ORAL

## 2017-04-11 MED ORDER — SODIUM CHLORIDE 0.9 % IV SOLN
Freq: Once | INTRAVENOUS | Status: AC
  Administered 2017-04-11: 13:00:00 via INTRAVENOUS

## 2017-04-11 NOTE — Plan of Care (Signed)
  Education: Knowledge of General Education information will improve 04/11/2017 0417 - Progressing by Anson Fret, RN Note POC reviewed with pt./ brother.

## 2017-04-11 NOTE — Progress Notes (Signed)
Progress Note  Patient Name: Kayla Wyatt Date of Encounter: 04/11/2017  Primary Cardiologist:   No primary care provider on file.   Subjective   Vomited bright red blood today.  She had been started on Xarelto yesterday.  She denies pain or SOB.  Inpatient Medications    Scheduled Meds: . feeding supplement (ENSURE ENLIVE)  237 mL Oral BID BM  . furosemide  20 mg Intravenous Once  . Gerhardt's butt cream   Topical TID  . metoprolol tartrate  12.5 mg Oral BID  . pantoprazole  40 mg Intravenous Q12H  . simvastatin  20 mg Oral Daily  . sodium chloride flush  3 mL Intravenous Q12H   Continuous Infusions: . sodium chloride    . sodium chloride    . cefTRIAXone (ROCEPHIN)  IV Stopped (04/10/17 1616)   PRN Meds: acetaminophen **OR** acetaminophen, nicotine, ondansetron **OR** ondansetron (ZOFRAN) IV   Vital Signs    Vitals:   04/11/17 0500 04/11/17 0512 04/11/17 0847 04/11/17 0955  BP:  (!) 141/59 (!) 173/62 (!) 99/53  Pulse:  68 90 (!) 111  Resp:  18  (!) 24  Temp:  98.4 F (36.9 C)  98.1 F (36.7 C)  TempSrc:  Oral  Oral  SpO2:  92%  98%  Weight: 169 lb 15.6 oz (77.1 kg)     Height:        Intake/Output Summary (Last 24 hours) at 04/11/2017 1401 Last data filed at 04/11/2017 0900 Gross per 24 hour  Intake 1080.83 ml  Output 250 ml  Net 830.83 ml   Filed Weights   04/09/17 0523 04/10/17 0400 04/11/17 0500  Weight: 159 lb 13.3 oz (72.5 kg) 159 lb 9.8 oz (72.4 kg) 169 lb 15.6 oz (77.1 kg)    Telemetry    NSR, sinus tach - Personally Reviewed  ECG    NA - Personally Reviewed  Physical Exam   GEN: No acute distress.   Neck: No  JVD Cardiac: RRR, no murmurs, rubs, or gallops.  Respiratory: Clear  to auscultation bilaterally. GI: Soft, nontender, non-distended  MS: No  edema; No deformity. Neuro:  Nonfocal  Psych: Normal affect   Labs    Chemistry Recent Labs  Lab 04/05/17 1504 04/07/17 1006 04/08/17 0248 04/09/17 0255 04/10/17 0243  04/11/17 0328  NA  --  144 144 143 143 143  K  --  3.3* 3.3* 3.7 3.6 3.1*  CL  --  113* 114* 115* 116* 113*  CO2  --  24 22 23  20* 23  GLUCOSE  --  109* 113* 105* 95 109*  BUN  --  17 10 7 6  5*  CREATININE  --  0.51 0.55 0.56 0.62 0.60  CALCIUM  --  7.7* 7.3* 7.6* 7.7* 8.1*  PROT 5.2* 4.4* 4.0*  --   --   --   ALBUMIN 2.2* 1.9* 1.9*  --   --   --   AST 47* 24 20  --   --   --   ALT 70* 40 33  --   --   --   ALKPHOS 64 51 42  --   --   --   BILITOT 0.5 0.3 0.8  --   --   --   GFRNONAA  --  >60 >60 >60 >60 >60  GFRAA  --  >60 >60 >60 >60 >60  ANIONGAP  --  7 8 5 7 7      Hematology Recent Labs  Lab 04/09/17 0255  04/10/17 0715 04/11/17 0328  WBC 15.2* 10.3 8.8  RBC 2.53* 2.62* 2.45*  HGB 7.9* 8.4* 7.8*  HCT 23.7* 25.3* 24.0*  MCV 93.7 96.6 98.0  MCH 31.2 32.1 31.8  MCHC 33.3 33.2 32.5  RDW 16.2* 18.2* 18.8*  PLT 145* 166 176    Cardiac EnzymesNo results for input(s): TROPONINI in the last 168 hours. No results for input(s): TROPIPOC in the last 168 hours.   BNPNo results for input(s): BNP, PROBNP in the last 168 hours.   DDimer No results for input(s): DDIMER in the last 168 hours.   Radiology    No results found.  Cardiac Studies   ECHO:    Study Conclusions  - Left ventricle: The cavity size was normal. Wall thickness was   normal. Systolic function was normal. The estimated ejection   fraction was in the range of 55% to 60%. Wall motion was normal;   there were no regional wall motion abnormalities. Features are   consistent with a pseudonormal left ventricular filling pattern,   with concomitant abnormal relaxation and increased filling   pressure (grade 2 diastolic dysfunction). - Mitral valve: There was mild regurgitation. - Left atrium: The atrium was mildly dilated. - Atrial septum: There was increased thickness of the septum,   consistent with lipomatous hypertrophy.    Patient Profile     61 y.o. female with past medical history of  hypertension and hyperlipidemia admitted with a GI bleed for evaluation of paroxysmal atrial fibrillation. Patient admitted on January 24 with GI bleed. EGD showed gastric ulcer which was clipped. She has been noted to have paroxysmal atrial fibrillation on telemetry and cardiology asked to evaluate. Echo shows normal LV function, mild LAEand mild mitral regurgitation.   Assessment & Plan    PAF:    Xarelto now stopped with acute GI bleeding.   I agree with not reversing the Xarelto at this point and transfusing as needed.   She is currently in NSR.    DVT:  Plan is for IVC filter.      For questions or updates, please contact Anna Please consult [email or web address] for contact info under Cardiology/STEMI.   Signed, Minus Breeding, MD  04/11/2017, 2:01 PM

## 2017-04-11 NOTE — Progress Notes (Signed)
Pt having frequent bloody stools. Shanon Brow, MD aware. Ordered total of 4 units of RBC and 2 units of FFP. Pt tolerating blood administrating well but very agigated with her condition. Will continue to monitor pt.

## 2017-04-11 NOTE — Progress Notes (Addendum)
Patient ID: Kayla Wyatt, female   DOB: 11/08/1955, 62 y.o.   MRN: [id number]  PROGRESS NOTE    Thania Woodlief  VOJ:[id number] DOB: Mar 30, 1955 DOA: 04/02/2017 PCP: [name], MD  Brief Narrative:  62 year old female with history of hypertension and hyperlipidemia presented on 04/02/2017 with abdominal pain and diarrhea. Her hemoglobin was 6.4. She was transfused packed red cells. GI was consulted. EGD showed gastric ulcer with visible vessel that was clipped. Patient developed new onset A. fib with RVR for which she was initially put on Cardizem drip which was switched to amiodarone drip. Cardiology was consulted. Patient was found to have right lower extremity DVT.  Colonoscopy on January 31 showing nothing acute.     Assessment & Plan:   Principal Problem:   Gastrointestinal hemorrhage with melena-patient has had no bleeding up until this morning when she developed vomited bright red blood.  Transfusing 2 units of packed red blood cells stat now providing a liter of IV fluids.  I have called Dr. Ardis Hughs who is covering for Dr. [name] this weekend in as she is likely bleeding from the previous vessel that was clipped on January 25.  Continue Protonix.  Xarelto was started yesterday.  Going to stop this.  Once stabilized again from a GI perspective is going to need IVC filter now.  Active Problems:   Acute blood loss anemia-transfusing 2 units right now.    Lower abdominal pain of unknown etiology-this had resolved    Persistent atrial fibrillation with rapid ventricular response (HCC) currently rate controlled-on metoprolol  New right lower extremity DVT-patient is definitely going to need IVC filter placement now.  Will need to call vascular surgery once GI issues stabilized.  UTI-continue Rocephin  Abnormal CT of chest with concerns for malignancy-need outpatient workup and follow-up close   DVT prophylaxis: SCDs Code Status: Full Family Communication: Has been    disposition Plan: Delayed now for several more days due to recurrence of GI bleed and now the need for IVC filter placement prior to discharge      Subjective: Patient was feeling better this morning.  She had no complaints.  I was later called by the floor nurse that she had vomited bright red blood and was complaining of epigastric pain.  Objective: Vitals:   04/11/17 0500 04/11/17 0512 04/11/17 0847 04/11/17 0955  BP:  (!) 141/59 (!) 173/62 (!) 99/53  Pulse:  68 90 (!) 111  Resp:  18  (!) 24  Temp:  98.4 F (36.9 C)  98.1 F (36.7 C)  TempSrc:  Oral  Oral  SpO2:  92%  98%  Weight: 77.1 kg (169 lb 15.6 oz)     Height:        Intake/Output Summary (Last 24 hours) at 04/11/2017 1020 Last data filed at 04/11/2017 0800 Gross per 24 hour  Intake 1258.33 ml  Output 250 ml  Net 1008.33 ml   Filed Weights   04/09/17 0523 04/10/17 0400 04/11/17 0500  Weight: 72.5 kg (159 lb 13.3 oz) 72.4 kg (159 lb 9.8 oz) 77.1 kg (169 lb 15.6 oz)    Examination:  General exam: Appears calm and comfortable  Respiratory system: Clear to auscultation. Respiratory effort normal. Cardiovascular system: S1 & S2 heard, RRR. No JVD, murmurs, rubs, gallops or clicks. No pedal edema. Gastrointestinal system: Abdomen is nondistended, soft and nontender. No organomegaly or masses felt. Normal bowel sounds heard. Central nervous system: Alert and oriented. No focal neurological deficits. Extremities: Symmetric 5 x 5 power. Skin:  No rashes, lesions or ulcers Psychiatry: Judgement and insight appear normal. Mood & affect appropriate.     Data Reviewed: I have personally reviewed following labs and imaging studies  CBC: Recent Labs  Lab 04/07/17 1006 04/08/17 0248 04/08/17 1211 04/09/17 0255 04/10/17 0715 04/11/17 0328  WBC 18.5* 21.1*  --  15.2* 10.3 8.8  NEUTROABS  --   --   --  11.6* 7.8* 6.5  HGB 6.0* 8.8* 8.5* 7.9* 8.4* 7.8*  HCT 17.8* 24.6* 24.6* 23.7* 25.3* 24.0*  MCV 92.2 90.1  --   93.7 96.6 98.0  PLT 238 149*  --  145* 166 756   Basic Metabolic Panel: Recent Labs  Lab 04/07/17 1006 04/08/17 0248 04/09/17 0255 04/10/17 0243 04/11/17 0328  NA 144 144 143 143 143  K 3.3* 3.3* 3.7 3.6 3.1*  CL 113* 114* 115* 116* 113*  CO2 24 22 23  20* 23  GLUCOSE 109* 113* 105* 95 109*  BUN 17 10 7 6  5*  CREATININE 0.51 0.55 0.56 0.62 0.60  CALCIUM 7.7* 7.3* 7.6* 7.7* 8.1*  MG  --   --  1.6* 1.9  --    GFR: Estimated Creatinine Clearance: 69.9 mL/min (by C-G formula based on SCr of 0.6 mg/dL). Liver Function Tests: Recent Labs  Lab 04/05/17 1504 04/07/17 1006 04/08/17 0248  AST 47* 24 20  ALT 70* 40 33  ALKPHOS 64 51 42  BILITOT 0.5 0.3 0.8  PROT 5.2* 4.4* 4.0*  ALBUMIN 2.2* 1.9* 1.9*   No results for input(s): LIPASE, AMYLASE in the last 168 hours. No results for input(s): AMMONIA in the last 168 hours. Coagulation Profile: No results for input(s): INR, PROTIME in the last 168 hours. Cardiac Enzymes: No results for input(s): CKTOTAL, CKMB, CKMBINDEX, TROPONINI in the last 168 hours. BNP (last 3 results) No results for input(s): PROBNP in the last 8760 hours. HbA1C: No results for input(s): HGBA1C in the last 72 hours. CBG: No results for input(s): GLUCAP in the last 168 hours. Lipid Profile: No results for input(s): CHOL, HDL, LDLCALC, TRIG, CHOLHDL, LDLDIRECT in the last 72 hours. Thyroid Function Tests: No results for input(s): TSH, T4TOTAL, FREET4, T3FREE, THYROIDAB in the last 72 hours. Anemia Panel: No results for input(s): VITAMINB12, FOLATE, FERRITIN, TIBC, IRON, RETICCTPCT in the last 72 hours. Sepsis Labs: Recent Labs  Lab 04/06/17 0914  PROCALCITON 0.13    Recent Results (from the past 240 hour(s))  Culture, blood (Routine x 2)     Status: Abnormal   Collection Time: 04/02/17  3:40 PM  Result Value Ref Range Status   Specimen Description BLOOD RIGHT FOREARM  Final   Special Requests   Final    BOTTLES DRAWN AEROBIC AND ANAEROBIC Blood  Culture adequate volume   Culture  Setup Time   Final    GRAM NEGATIVE RODS AEROBIC BOTTLE ONLY CRITICAL RESULT CALLED TO, READ BACK BY AND VERIFIED WITH: JCARNEY,PHARMD @ 2219 04/03/17 BY LHOWARD    Culture ESCHERICHIA COLI (A)  Final   Report Status 04/05/2017 FINAL  Final   Organism ID, Bacteria ESCHERICHIA COLI  Final      Susceptibility   Escherichia coli - MIC*    AMPICILLIN <=2 SENSITIVE Sensitive     CEFAZOLIN <=4 SENSITIVE Sensitive     CEFEPIME <=1 SENSITIVE Sensitive     CEFTAZIDIME <=1 SENSITIVE Sensitive     CEFTRIAXONE <=1 SENSITIVE Sensitive     CIPROFLOXACIN <=0.25 SENSITIVE Sensitive     GENTAMICIN <=1 SENSITIVE Sensitive  IMIPENEM <=0.25 SENSITIVE Sensitive     TRIMETH/SULFA <=20 SENSITIVE Sensitive     AMPICILLIN/SULBACTAM <=2 SENSITIVE Sensitive     PIP/TAZO <=4 SENSITIVE Sensitive     Extended ESBL NEGATIVE Sensitive     * ESCHERICHIA COLI  Blood Culture ID Panel (Reflexed)     Status: Abnormal   Collection Time: 04/02/17  3:40 PM  Result Value Ref Range Status   Enterococcus species NOT DETECTED NOT DETECTED Final   Listeria monocytogenes NOT DETECTED NOT DETECTED Final   Staphylococcus species NOT DETECTED NOT DETECTED Final   Staphylococcus aureus NOT DETECTED NOT DETECTED Final   Streptococcus species NOT DETECTED NOT DETECTED Final   Streptococcus agalactiae NOT DETECTED NOT DETECTED Final   Streptococcus pneumoniae NOT DETECTED NOT DETECTED Final   Streptococcus pyogenes NOT DETECTED NOT DETECTED Final   Acinetobacter baumannii NOT DETECTED NOT DETECTED Final   Enterobacteriaceae species DETECTED (A) NOT DETECTED Final    Comment: Enterobacteriaceae represent a large family of gram-negative bacteria, not a single organism. CRITICAL RESULT CALLED TO, READ BACK BY AND VERIFIED WITH: JCARNEY,PHARMD @2219  04/03/17 BY LHOWARD    Enterobacter cloacae complex NOT DETECTED NOT DETECTED Final   Escherichia coli DETECTED (A) NOT DETECTED Final     Comment: CRITICAL RESULT CALLED TO, READ BACK BY AND VERIFIED WITH: JCARNEY,PHARMD @2219  04/03/17 BY LHOWARD    Klebsiella oxytoca NOT DETECTED NOT DETECTED Final   Klebsiella pneumoniae NOT DETECTED NOT DETECTED Final   Proteus species NOT DETECTED NOT DETECTED Final   Serratia marcescens NOT DETECTED NOT DETECTED Final   Carbapenem resistance NOT DETECTED NOT DETECTED Final   Haemophilus influenzae NOT DETECTED NOT DETECTED Final   Neisseria meningitidis NOT DETECTED NOT DETECTED Final   Pseudomonas aeruginosa NOT DETECTED NOT DETECTED Final   Candida albicans NOT DETECTED NOT DETECTED Final   Candida glabrata NOT DETECTED NOT DETECTED Final   Candida krusei NOT DETECTED NOT DETECTED Final   Candida parapsilosis NOT DETECTED NOT DETECTED Final   Candida tropicalis NOT DETECTED NOT DETECTED Final  Culture, blood (Routine x 2)     Status: None   Collection Time: 04/02/17  3:44 PM  Result Value Ref Range Status   Specimen Description BLOOD RIGHT FOREARM  Final   Special Requests   Final    BOTTLES DRAWN AEROBIC AND ANAEROBIC Blood Culture adequate volume   Culture NO GROWTH 5 DAYS  Final   Report Status 04/07/2017 FINAL  Final  Urine culture     Status: Abnormal   Collection Time: 04/02/17  5:05 PM  Result Value Ref Range Status   Specimen Description URINE, RANDOM  Final   Special Requests NONE  Final   Culture MULTIPLE SPECIES PRESENT, SUGGEST RECOLLECTION (A)  Final   Report Status 04/03/2017 FINAL  Final  MRSA PCR Screening     Status: None   Collection Time: 04/02/17  8:43 PM  Result Value Ref Range Status   MRSA by PCR NEGATIVE NEGATIVE Final    Comment:        The GeneXpert MRSA Assay (FDA approved for NASAL specimens only), is one component of a comprehensive MRSA colonization surveillance program. It is not intended to diagnose MRSA infection nor to guide or monitor treatment for MRSA infections.   Culture, blood (routine x 2)     Status: None   Collection Time:  04/04/17  3:16 PM  Result Value Ref Range Status   Specimen Description BLOOD LEFT ANTECUBITAL  Final   Special Requests IN PEDIATRIC BOTTLE  Blood Culture adequate volume  Final   Culture NO GROWTH 5 DAYS  Final   Report Status 04/09/2017 FINAL  Final  Culture, blood (routine x 2)     Status: None   Collection Time: 04/04/17  3:19 PM  Result Value Ref Range Status   Specimen Description BLOOD LEFT HAND  Final   Special Requests IN PEDIATRIC BOTTLE Blood Culture adequate volume  Final   Culture NO GROWTH 5 DAYS  Final   Report Status 04/09/2017 FINAL  Final  Culture, Urine     Status: None   Collection Time: 04/05/17 12:19 PM  Result Value Ref Range Status   Specimen Description URINE, RANDOM  Final   Special Requests NONE  Final   Culture NO GROWTH  Final   Report Status 04/06/2017 FINAL  Final         Radiology Studies: No results found.      Scheduled Meds: . feeding supplement (ENSURE ENLIVE)  237 mL Oral BID BM  . furosemide  20 mg Intravenous Once  . Gerhardt's butt cream   Topical TID  . metoprolol tartrate  12.5 mg Oral BID  . pantoprazole  40 mg Intravenous Q12H  . potassium chloride  40 mEq Oral Once  . Rivaroxaban  15 mg Oral BID WC  . [START ON 05/02/2017] rivaroxaban  20 mg Oral Q supper  . simvastatin  20 mg Oral Daily  . sodium chloride flush  3 mL Intravenous Q12H   Continuous Infusions: . sodium chloride    . cefTRIAXone (ROCEPHIN)  IV Stopped (04/10/17 1616)  . sodium chloride       LOS: 9 days    Time spent: 40 minutes    Rod Majerus A, MD [facility] Pager [phone]  If 7PM-7AM, please contact night-coverage [email or web address] Password [email or web address] 04/11/2017, 10:20 AM   prbcs ordered stat this am still infusing on floor.  bp soft.  Will order another 2 liters and transfer to stepdown for closer monitoring.

## 2017-04-11 NOTE — H&P (View-Only) (Signed)
Consultation  Referring Provider: [facility]/Dr Shanon Brow Primary Care Physician:  [name], MD Primary Gastroenterologist:  Dr.Hung  Reason for Consultation:  Vomiting blood  HPI: Kayla Wyatt is a 62 y.o. female known to Dr. [name], who we are asked to reconsult on today. Patient was initially admitted on 04/02/2017 with an acute GI bleed with hematemesis. She underwent upper endoscopy with finding of a gastric ulcer in the gastric body with a visible vessel. This was endoclipped. She also had a small nodule in the stomach which was endoclipped as well. She did require about 5 units of packed RBCs during that bleed. She had ongoing issues with rectal bleeding and then underwent colonoscopy on 04/09/2017 with finding of scattered sigmoid diverticulosis, otherwise negative exam and there was no blood in the colon. Unfortunately she has developed a right lower extremity DVT, and she underwent CT angiogram on 04/07/2017 lot pulmonary embolus. She did not have any emboli but does have a soft tissue mass measuring 4.1 cm in the mediastinum with scattered lymph nodes in the mediastinum as well. She has a small right pleural effusion. The mediastinal masses felt to be malignant. She had been stable without any acute bleeding over the past 2 days and decision was made to go ahead with anticoagulation with Xarelto. She did receive Xarelto last night and again this morning. At about 9:30 AM she had an episode of diaphoresis lightheadedness nausea and then vomited between 2 and 300 mL of dark red blood. She had 1 bowel movement earlier this morning which she said looked "normal". Blood pressure was in the high 65L systolic. Hemoglobin yesterday was 8.4, early this a.m. hemoglobin 7.8. She is currently receiving an IV fluid bolus and then will have 2 units of packed RBCs.    Past Medical History:  Diagnosis Date  . Acute blood loss anemia 04/02/2017  . Gastrointestinal hemorrhage with melena     . Hypercholesteremia   . Hypertension   . Persistent atrial fibrillation with rapid ventricular response ([place]) 04/07/2017  . Vitamin D deficiency     Past Surgical History:  Procedure Laterality Date  . COLONOSCOPY Left 04/09/2017   Procedure: COLONOSCOPY;  Surgeon: Carol Ada, MD;  Location: [place];  Service: Endoscopy;  Laterality: Left;  . ESOPHAGOGASTRODUODENOSCOPY N/A 04/03/2017   Procedure: ESOPHAGOGASTRODUODENOSCOPY (EGD);  Surgeon: Carol Ada, MD;  Location: [place];  Service: Endoscopy;  Laterality: N/A;  . LEG SURGERY  1974   Blood Clot Removal     Prior to Admission medications   Medication Sig Start Date End Date Taking? Authorizing Provider  aspirin 81 MG tablet Take 81 mg by mouth daily.   Yes [provider]  cholecalciferol (VITAMIN D) 1000 units tablet Take 1,000 Units by mouth 2 (two) times daily.   Yes [provider]  lisinopril-hydrochlorothiazide (PRINZIDE,ZESTORETIC) 20-12.5 MG tablet Take 1 tablet by mouth daily.   Yes [provider]  simvastatin (ZOCOR) 20 MG tablet Take 1 tablet by mouth daily. 01/02/17  Yes [provider]    Current Facility-Administered Medications  Medication Dose Route Frequency Provider Last Rate Last Dose  . 0.9 %  sodium chloride infusion   Intravenous Once Derrill Kay A, MD      . 0.9 %  sodium chloride infusion   Intravenous Once Esterwood, Amy S, PA-C      . acetaminophen (TYLENOL) tablet 650 mg  650 mg Oral Q6H PRN Elwin Mocha, MD   650 mg at 04/08/17 1047  Or  . acetaminophen (TYLENOL) suppository 650 mg  650 mg Rectal Q6H PRN Elwin Mocha, MD      . cefTRIAXone (ROCEPHIN) 2 g in dextrose 5 % 50 mL IVPB  2 g Intravenous Q24H Irene Pap N, DO   Stopped at 04/10/17 1616  . feeding supplement (ENSURE ENLIVE) (ENSURE ENLIVE) liquid 237 mL  237 mL Oral BID BM Hall, Carole N, DO   237 mL at 04/11/17 0845  . furosemide (LASIX) injection 20 mg  20 mg Intravenous Once  Phillips Grout, MD      . Gerhardt's butt cream   Topical TID Aline August, MD      . metoprolol tartrate (LOPRESSOR) tablet 12.5 mg  12.5 mg Oral BID Irene Pap N, DO   12.5 mg at 04/11/17 0847  . nicotine (NICODERM CQ - dosed in mg/24 hours) patch 14 mg  14 mg Transdermal Daily PRN Elwin Mocha, MD      . ondansetron [facility]) tablet 4 mg  4 mg Oral Q6H PRN Elwin Mocha, MD       Or  . ondansetron [facility]) injection 4 mg  4 mg Intravenous Q6H PRN Elwin Mocha, MD      . pantoprazole (PROTONIX) injection 40 mg  40 mg Intravenous [address] N, DO   40 mg at 04/11/17 0849  . potassium chloride SA (K-DUR,KLOR-CON) CR tablet 40 mEq  40 mEq Oral Once Derrill Kay A, MD      . simvastatin (ZOCOR) tablet 20 mg  20 mg Oral Daily [place], Archie Patten N, DO   20 mg at 04/11/17 0848  . sodium chloride flush (NS) 0.9 % injection 3 mL  3 mL Intravenous Q12H Elwin Mocha, MD   3 mL at 04/11/17 0849    Allergies as of 04/02/2017  . (No Known Allergies)    History reviewed. No pertinent family history.  Social History   Socioeconomic History  . Marital status: Unknown    Spouse name: Not on file  . Number of children: Not on file  . Years of education: Not on file  . Highest education level: Not on file  Social Needs  . Financial resource strain: Not on file  . Food insecurity - worry: Not on file  . Food insecurity - inability: Not on file  . Transportation needs - medical: Not on file  . Transportation needs - non-medical: Not on file  Occupational History  . Occupation: Has to lift heavy boxes at times.    Employer: [employer]  Tobacco Use  . Smoking status: Current Every Day Smoker  . Smokeless tobacco: Never Used  Substance and Sexual Activity  . Alcohol use: No    Frequency: Never  . Drug use: No  . Sexual activity: Not on file  Other Topics Concern  . Not on file  Social History Narrative  . Not on file    Review of Systems: Pertinent positive and negative  review of systems were noted in the above HPI section.  All other review of systems was otherwise negative.  Physical Exam: Vital signs in last 24 hours: Temp:  [98.1 F (36.7 C)-98.4 F (36.9 C)] 98.1 F (36.7 C) (02/02 0955) Pulse Rate:  [66-111] 111 (02/02 0955) Resp:  [15-26] 24 (02/02 0955) BP: (99-173)/(53-67) 99/53 (02/02 0955) SpO2:  [92 %-98 %] 98 % (02/02 0955) Weight:  [169 lb 15.6 oz (77.1 kg)] 169 lb 15.6 oz (77.1 kg) (02/02 0500) Last BM Date:  04/11/17 General:   Alert,  Well-developed,ill appearing WF, pale  well-nourished, pleasant and cooperative in NAD Head:  Normocephalic and atraumatic. Eyes:  Sclera clear, no icterus.   Conjunctiva pale. Ears:  Normal auditory acuity. Nose:  No deformity, discharge,  or lesions. Mouth:  No deformity or lesions.   Neck:  Supple; no masses or thyromegaly. Lungs:  Clear throughout to auscultation.   No wheezes, crackles, or rhonchi. Heart:  Regular rate and rhythm; no murmurs, clicks, rubs,  or gallops. Abdomen:  Soft,nontender, BS active,nonpalp mass or hsm.   Rectal:  Deferred - emesis basin with 200 cc dark and red blood Msk:  Symmetrical without gross deformities. . Pulses:  Normal pulses noted. Extremities:  Without clubbing or edema. Neurologic:  Alert and  oriented x4;  grossly normal neurologically. Skin:  Intact without significant lesions or rashes.. Psych:  Alert and cooperative. Normal mood and affect.  Intake/Output from previous day: 02/01 0701 - 02/02 0700 In: 2108.3 [P.O.:1080; I.V.:978.3; IV Piggyback:50] Out: 500 [Urine:500] Intake/Output this shift: Total I/O In: 50 [P.O.:50] Out: -   Lab Results: Recent Labs    04/09/17 0255 04/10/17 0715 04/11/17 0328  WBC 15.2* 10.3 8.8  HGB 7.9* 8.4* 7.8*  HCT 23.7* 25.3* 24.0*  PLT 145* 166 176   BMET Recent Labs    04/09/17 0255 04/10/17 0243 04/11/17 0328  NA 143 143 143  K 3.7 3.6 3.1*  CL 115* 116* 113*  CO2 23 20* 23  GLUCOSE 105* 95 109*    BUN 7 6 5*  CREATININE 0.56 0.62 0.60  CALCIUM 7.6* 7.7* 8.1*       IMPRESSION:  #52 62 year old white female admitted with acute upper GI bleed on 04/02/2017 found secondary to a gastric ulcer with visible vessel. She had EGD with Endo Clip and on 04/03/2017. Patient was started on anticoagulation with Xarelto over the past 24 hours, and has developed acute upper GI bleed this morning with hematemesis. She is almost certainly bleeding from the previously documented gastric ulcer(s). #2 anemia secondary to acute blood loss #3 mild hypotension secondary to above #4 colonoscopy on 04/09/2017 negative except diverticulosis  #5 Right lower extremity DVT-angiogram negative for PE #6 new finding of 4.1 cm mediastinal mass likely malignant-workup in progress  PLAN: #1 nothing by mouth #2 transfuse 2 units of packed RBCs #3 have ordered 2 units of FFP to be given after blood transfusions #4 stop Xarelto #5 transfuse to keep hemoglobin in the 8-9 range #6 reversal agent for Xarelto is available, I spoke with pharmacy Andexa can be given as an IV bolus and infusion, this is to be used for life-threatening bleeding, and can reverse rivaroxaban within 2 hours. Drug is extremely expensive - will hold for now , support as outlined above and reserve reversal agent for life threatening decline in status.    Amy Esterwood  04/11/2017, 11:46 AM  ________________________________________________________________________  Velora Heckler GI MD note:  I personally examined the patient, reviewed the data and agree with the assessment and plan described above.  REcurrent hematemesis, almost certainly from gastric ulcer(s) noted on recent EGD, clipped.  She needs repeat EGD; planning for tomorrow morning to allow at least some time for newly started xarelto to washout.  She is getting PRBC and FFP today.  I am available until tomorrow PM.  Please call me if there is concern for persistent significant bleeding,  hemodynamic instability etc.    Owens Loffler, MD [facility] Pager [phone]

## 2017-04-11 NOTE — Consult Note (Signed)
Consultation  Referring Provider: [facility]/Dr Shanon Brow Primary Care Physician:  [name], MD Primary Gastroenterologist:  Dr.Hung  Reason for Consultation:  Vomiting blood  HPI: Kayla Wyatt is a 62 y.o. female known to Dr. [name], who we are asked to reconsult on today. Patient was initially admitted on 04/02/2017 with an acute GI bleed with hematemesis. She underwent upper endoscopy with finding of a gastric ulcer in the gastric body with a visible vessel. This was endoclipped. She also had a small nodule in the stomach which was endoclipped as well. She did require about 5 units of packed RBCs during that bleed. She had ongoing issues with rectal bleeding and then underwent colonoscopy on 04/09/2017 with finding of scattered sigmoid diverticulosis, otherwise negative exam and there was no blood in the colon. Unfortunately she has developed a right lower extremity DVT, and she underwent CT angiogram on 04/07/2017 lot pulmonary embolus. She did not have any emboli but does have a soft tissue mass measuring 4.1 cm in the mediastinum with scattered lymph nodes in the mediastinum as well. She has a small right pleural effusion. The mediastinal masses felt to be malignant. She had been stable without any acute bleeding over the past 2 days and decision was made to go ahead with anticoagulation with Xarelto. She did receive Xarelto last night and again this morning. At about 9:30 AM she had an episode of diaphoresis lightheadedness nausea and then vomited between 2 and 300 mL of dark red blood. She had 1 bowel movement earlier this morning which she said looked "normal". Blood pressure was in the high 08Q systolic. Hemoglobin yesterday was 8.4, early this a.m. hemoglobin 7.8. She is currently receiving an IV fluid bolus and then will have 2 units of packed RBCs.    Past Medical History:  Diagnosis Date  . Acute blood loss anemia 04/02/2017  . Gastrointestinal hemorrhage with melena     . Hypercholesteremia   . Hypertension   . Persistent atrial fibrillation with rapid ventricular response ([place]) 04/07/2017  . Vitamin D deficiency     Past Surgical History:  Procedure Laterality Date  . COLONOSCOPY Left 04/09/2017   Procedure: COLONOSCOPY;  Surgeon: Carol Ada, MD;  Location: [place];  Service: Endoscopy;  Laterality: Left;  . ESOPHAGOGASTRODUODENOSCOPY N/A 04/03/2017   Procedure: ESOPHAGOGASTRODUODENOSCOPY (EGD);  Surgeon: Carol Ada, MD;  Location: [place];  Service: Endoscopy;  Laterality: N/A;  . LEG SURGERY  1974   Blood Clot Removal     Prior to Admission medications   Medication Sig Start Date End Date Taking? Authorizing Provider  aspirin 81 MG tablet Take 81 mg by mouth daily.   Yes [provider]  cholecalciferol (VITAMIN D) 1000 units tablet Take 1,000 Units by mouth 2 (two) times daily.   Yes [provider]  lisinopril-hydrochlorothiazide (PRINZIDE,ZESTORETIC) 20-12.5 MG tablet Take 1 tablet by mouth daily.   Yes [provider]  simvastatin (ZOCOR) 20 MG tablet Take 1 tablet by mouth daily. 01/02/17  Yes [provider]    Current Facility-Administered Medications  Medication Dose Route Frequency Provider Last Rate Last Dose  . 0.9 %  sodium chloride infusion   Intravenous Once Derrill Kay A, MD      . 0.9 %  sodium chloride infusion   Intravenous Once Esterwood, Amy S, PA-C      . acetaminophen (TYLENOL) tablet 650 mg  650 mg Oral Q6H PRN Elwin Mocha, MD   650 mg at 04/08/17 1047  Or  . acetaminophen (TYLENOL) suppository 650 mg  650 mg Rectal Q6H PRN Elwin Mocha, MD      . cefTRIAXone (ROCEPHIN) 2 g in dextrose 5 % 50 mL IVPB  2 g Intravenous Q24H Irene Pap N, DO   Stopped at 04/10/17 1616  . feeding supplement (ENSURE ENLIVE) (ENSURE ENLIVE) liquid 237 mL  237 mL Oral BID BM Hall, Carole N, DO   237 mL at 04/11/17 0845  . furosemide (LASIX) injection 20 mg  20 mg Intravenous Once  Phillips Grout, MD      . Gerhardt's butt cream   Topical TID Aline August, MD      . metoprolol tartrate (LOPRESSOR) tablet 12.5 mg  12.5 mg Oral BID Irene Pap N, DO   12.5 mg at 04/11/17 0847  . nicotine (NICODERM CQ - dosed in mg/24 hours) patch 14 mg  14 mg Transdermal Daily PRN Elwin Mocha, MD      . ondansetron [facility]) tablet 4 mg  4 mg Oral Q6H PRN Elwin Mocha, MD       Or  . ondansetron [facility]) injection 4 mg  4 mg Intravenous Q6H PRN Elwin Mocha, MD      . pantoprazole (PROTONIX) injection 40 mg  40 mg Intravenous [address] N, DO   40 mg at 04/11/17 0849  . potassium chloride SA (K-DUR,KLOR-CON) CR tablet 40 mEq  40 mEq Oral Once Derrill Kay A, MD      . simvastatin (ZOCOR) tablet 20 mg  20 mg Oral Daily [place], Archie Patten N, DO   20 mg at 04/11/17 0848  . sodium chloride flush (NS) 0.9 % injection 3 mL  3 mL Intravenous Q12H Elwin Mocha, MD   3 mL at 04/11/17 0849    Allergies as of 04/02/2017  . (No Known Allergies)    History reviewed. No pertinent family history.  Social History   Socioeconomic History  . Marital status: Unknown    Spouse name: Not on file  . Number of children: Not on file  . Years of education: Not on file  . Highest education level: Not on file  Social Needs  . Financial resource strain: Not on file  . Food insecurity - worry: Not on file  . Food insecurity - inability: Not on file  . Transportation needs - medical: Not on file  . Transportation needs - non-medical: Not on file  Occupational History  . Occupation: Has to lift heavy boxes at times.    Employer: [employer]  Tobacco Use  . Smoking status: Current Every Day Smoker  . Smokeless tobacco: Never Used  Substance and Sexual Activity  . Alcohol use: No    Frequency: Never  . Drug use: No  . Sexual activity: Not on file  Other Topics Concern  . Not on file  Social History Narrative  . Not on file    Review of Systems: Pertinent positive and negative  review of systems were noted in the above HPI section.  All other review of systems was otherwise negative.  Physical Exam: Vital signs in last 24 hours: Temp:  [98.1 F (36.7 C)-98.4 F (36.9 C)] 98.1 F (36.7 C) (02/02 0955) Pulse Rate:  [66-111] 111 (02/02 0955) Resp:  [15-26] 24 (02/02 0955) BP: (99-173)/(53-67) 99/53 (02/02 0955) SpO2:  [92 %-98 %] 98 % (02/02 0955) Weight:  [169 lb 15.6 oz (77.1 kg)] 169 lb 15.6 oz (77.1 kg) (02/02 0500) Last BM Date:  04/11/17 General:   Alert,  Well-developed,ill appearing WF, pale  well-nourished, pleasant and cooperative in NAD Head:  Normocephalic and atraumatic. Eyes:  Sclera clear, no icterus.   Conjunctiva pale. Ears:  Normal auditory acuity. Nose:  No deformity, discharge,  or lesions. Mouth:  No deformity or lesions.   Neck:  Supple; no masses or thyromegaly. Lungs:  Clear throughout to auscultation.   No wheezes, crackles, or rhonchi. Heart:  Regular rate and rhythm; no murmurs, clicks, rubs,  or gallops. Abdomen:  Soft,nontender, BS active,nonpalp mass or hsm.   Rectal:  Deferred - emesis basin with 200 cc dark and red blood Msk:  Symmetrical without gross deformities. . Pulses:  Normal pulses noted. Extremities:  Without clubbing or edema. Neurologic:  Alert and  oriented x4;  grossly normal neurologically. Skin:  Intact without significant lesions or rashes.. Psych:  Alert and cooperative. Normal mood and affect.  Intake/Output from previous day: 02/01 0701 - 02/02 0700 In: 2108.3 [P.O.:1080; I.V.:978.3; IV Piggyback:50] Out: 500 [Urine:500] Intake/Output this shift: Total I/O In: 50 [P.O.:50] Out: -   Lab Results: Recent Labs    04/09/17 0255 04/10/17 0715 04/11/17 0328  WBC 15.2* 10.3 8.8  HGB 7.9* 8.4* 7.8*  HCT 23.7* 25.3* 24.0*  PLT 145* 166 176   BMET Recent Labs    04/09/17 0255 04/10/17 0243 04/11/17 0328  NA 143 143 143  K 3.7 3.6 3.1*  CL 115* 116* 113*  CO2 23 20* 23  GLUCOSE 105* 95 109*    BUN 7 6 5*  CREATININE 0.56 0.62 0.60  CALCIUM 7.6* 7.7* 8.1*       IMPRESSION:  #60 62 year old white female admitted with acute upper GI bleed on 04/02/2017 found secondary to a gastric ulcer with visible vessel. She had EGD with Endo Clip and on 04/03/2017. Patient was started on anticoagulation with Xarelto over the past 24 hours, and has developed acute upper GI bleed this morning with hematemesis. She is almost certainly bleeding from the previously documented gastric ulcer(s). #2 anemia secondary to acute blood loss #3 mild hypotension secondary to above #4 colonoscopy on 04/09/2017 negative except diverticulosis  #5 Right lower extremity DVT-angiogram negative for PE #6 new finding of 4.1 cm mediastinal mass likely malignant-workup in progress  PLAN: #1 nothing by mouth #2 transfuse 2 units of packed RBCs #3 have ordered 2 units of FFP to be given after blood transfusions #4 stop Xarelto #5 transfuse to keep hemoglobin in the 8-9 range #6 reversal agent for Xarelto is available, I spoke with pharmacy Andexa can be given as an IV bolus and infusion, this is to be used for life-threatening bleeding, and can reverse rivaroxaban within 2 hours. Drug is extremely expensive - will hold for now , support as outlined above and reserve reversal agent for life threatening decline in status.    Amy Esterwood  04/11/2017, 11:46 AM  ________________________________________________________________________  Velora Heckler GI MD note:  I personally examined the patient, reviewed the data and agree with the assessment and plan described above.  REcurrent hematemesis, almost certainly from gastric ulcer(s) noted on recent EGD, clipped.  She needs repeat EGD; planning for tomorrow morning to allow at least some time for newly started xarelto to washout.  She is getting PRBC and FFP today.  I am available until tomorrow PM.  Please call me if there is concern for persistent significant bleeding,  hemodynamic instability etc.    Owens Loffler, MD [facility]astro[facility] Pager [phone]

## 2017-04-12 ENCOUNTER — Encounter (HOSPITAL_COMMUNITY)
Admission: EM | Disposition: A | Discharge status: Rehabilitation Facility | Payer: Self-pay | Source: Home / Self Care | Attending: Internal Medicine

## 2017-04-12 ENCOUNTER — Inpatient Hospital Stay (HOSPITAL_COMMUNITY): Payer: BLUE CROSS/BLUE SHIELD

## 2017-04-12 ENCOUNTER — Encounter (HOSPITAL_COMMUNITY): Payer: Self-pay

## 2017-04-12 DIAGNOSIS — J9690 Respiratory failure, unspecified, unspecified whether with hypoxia or hypercapnia: Secondary | ICD-10-CM

## 2017-04-12 DIAGNOSIS — Z01818 Encounter for other preprocedural examination: Secondary | ICD-10-CM

## 2017-04-12 DIAGNOSIS — K920 Hematemesis: Secondary | ICD-10-CM

## 2017-04-12 DIAGNOSIS — I481 Persistent atrial fibrillation: Secondary | ICD-10-CM

## 2017-04-12 DIAGNOSIS — K921 Melena: Secondary | ICD-10-CM

## 2017-04-12 HISTORY — PX: IR ANGIOGRAM VISCERAL SELECTIVE: IMG657

## 2017-04-12 HISTORY — PX: IR US GUIDE VASC ACCESS RIGHT: IMG2390

## 2017-04-12 HISTORY — PX: ESOPHAGOGASTRODUODENOSCOPY (EGD) WITH PROPOFOL: SHX5813

## 2017-04-12 HISTORY — PX: IR EMBO ART  VEN HEMORR LYMPH EXTRAV  INC GUIDE ROADMAPPING: IMG5450

## 2017-04-12 HISTORY — PX: IR ANGIOGRAM SELECTIVE EACH ADDITIONAL VESSEL: IMG667

## 2017-04-12 HISTORY — PX: IR IVC FILTER PLMT / S&I /IMG GUID/MOD SED: IMG701

## 2017-04-12 LAB — PREPARE FRESH FROZEN PLASMA
Unit division: 0
Unit division: 0

## 2017-04-12 LAB — POCT I-STAT 3, ART BLOOD GAS (G3+)
Acid-Base Excess: 3 mmol/L — ABNORMAL HIGH (ref 0.0–2.0)
Acid-base deficit: 18 mmol/L — ABNORMAL HIGH (ref 0.0–2.0)
Acid-base deficit: 6 mmol/L — ABNORMAL HIGH (ref 0.0–2.0)
Bicarbonate: 12.9 mmol/L — ABNORMAL LOW (ref 20.0–28.0)
Bicarbonate: 21.5 mmol/L (ref 20.0–28.0)
Bicarbonate: 29.2 mmol/L — ABNORMAL HIGH (ref 20.0–28.0)
O2 Saturation: 100 %
O2 Saturation: 100 %
O2 Saturation: 96 %
Patient temperature: 98
Patient temperature: 98.9
TCO2: 15 mmol/L — ABNORMAL LOW (ref 22–32)
TCO2: 23 mmol/L (ref 22–32)
TCO2: 31 mmol/L (ref 22–32)
pCO2 arterial: 63.8 mmHg — ABNORMAL HIGH (ref 32.0–48.0)
pCO2 arterial: 55.7 mmHg — ABNORMAL HIGH (ref 32.0–48.0)
pCO2 arterial: 55.7 mmHg — ABNORMAL HIGH (ref 32.0–48.0)
pH, Arterial: 6.913 — CL (ref 7.350–7.450)
pH, Arterial: 7.195 — CL (ref 7.350–7.450)
pH, Arterial: 7.328 — ABNORMAL LOW (ref 7.350–7.450)
pO2, Arterial: 511 mmHg — ABNORMAL HIGH (ref 83.0–108.0)
pO2, Arterial: 403 mmHg — ABNORMAL HIGH (ref 83.0–108.0)
pO2, Arterial: 87 mmHg (ref 83.0–108.0)

## 2017-04-12 LAB — HEMOGLOBIN AND HEMATOCRIT, BLOOD
HCT: 19.5 % — ABNORMAL LOW (ref 36.0–46.0)
HCT: 24.7 % — ABNORMAL LOW (ref 36.0–46.0)
HCT: 15.8 % — ABNORMAL LOW (ref 36.0–46.0)
HCT: 25.8 % — ABNORMAL LOW (ref 36.0–46.0)
Hemoglobin: 6.7 g/dL — CL (ref 12.0–15.0)
Hemoglobin: 8.8 g/dL — ABNORMAL LOW (ref 12.0–15.0)
Hemoglobin: 5.5 g/dL — CL (ref 12.0–15.0)
Hemoglobin: 9.2 g/dL — ABNORMAL LOW (ref 12.0–15.0)

## 2017-04-12 LAB — BASIC METABOLIC PANEL
Anion gap: 13 (ref 5–15)
Anion gap: 14 (ref 5–15)
Anion gap: 7 (ref 5–15)
BUN: 12 mg/dL (ref 6–20)
BUN: 11 mg/dL (ref 6–20)
BUN: 9 mg/dL (ref 6–20)
CO2: 20 mmol/L — ABNORMAL LOW (ref 22–32)
CO2: 26 mmol/L (ref 22–32)
CO2: 28 mmol/L (ref 22–32)
Calcium: 6.3 mg/dL — CL (ref 8.9–10.3)
Calcium: 6.7 mg/dL — ABNORMAL LOW (ref 8.9–10.3)
Calcium: 6 mg/dL — CL (ref 8.9–10.3)
Chloride: 116 mmol/L — ABNORMAL HIGH (ref 101–111)
Chloride: 106 mmol/L (ref 101–111)
Chloride: 113 mmol/L — ABNORMAL HIGH (ref 101–111)
Creatinine, Ser: 1.16 mg/dL — ABNORMAL HIGH (ref 0.44–1.00)
Creatinine, Ser: 1.15 mg/dL — ABNORMAL HIGH (ref 0.44–1.00)
Creatinine, Ser: 1.09 mg/dL — ABNORMAL HIGH (ref 0.44–1.00)
GFR calc Af Amer: 56 mL/min — ABNORMAL LOW (ref >60)
GFR calc Af Amer: 57 mL/min — ABNORMAL LOW (ref >60)
GFR calc Af Amer: >60 mL/min (ref >60)
GFR calc non Af Amer: 49 mL/min — ABNORMAL LOW (ref >60)
GFR calc non Af Amer: 49 mL/min — ABNORMAL LOW (ref >60)
GFR calc non Af Amer: 52 mL/min — ABNORMAL LOW (ref >60)
Glucose, Bld: 232 mg/dL — ABNORMAL HIGH (ref 65–99)
Glucose, Bld: 166 mg/dL — ABNORMAL HIGH (ref 65–99)
Glucose, Bld: 107 mg/dL — ABNORMAL HIGH (ref 65–99)
Potassium: 4 mmol/L (ref 3.5–5.1)
Potassium: 3.3 mmol/L — ABNORMAL LOW (ref 3.5–5.1)
Potassium: 3 mmol/L — ABNORMAL LOW (ref 3.5–5.1)
Sodium: 149 mmol/L — ABNORMAL HIGH (ref 135–145)
Sodium: 146 mmol/L — ABNORMAL HIGH (ref 135–145)
Sodium: 148 mmol/L — ABNORMAL HIGH (ref 135–145)

## 2017-04-12 LAB — CBC WITH DIFFERENTIAL/PLATELET
Basophils Absolute: 0 10*3/uL (ref 0.0–0.1)
Basophils Relative: 0 %
Eosinophils Absolute: 0 10*3/uL (ref 0.0–0.7)
Eosinophils Relative: 0 %
HCT: 28.3 % — ABNORMAL LOW (ref 36.0–46.0)
Hemoglobin: 9.6 g/dL — ABNORMAL LOW (ref 12.0–15.0)
Lymphocytes Relative: 7 %
Lymphs Abs: 2.5 10*3/uL (ref 0.7–4.0)
MCH: 29.9 pg (ref 26.0–34.0)
MCHC: 33.9 g/dL (ref 30.0–36.0)
MCV: 88.2 fL (ref 78.0–100.0)
Monocytes Absolute: 1.8 10*3/uL — ABNORMAL HIGH (ref 0.1–1.0)
Monocytes Relative: 5 %
Neutro Abs: 30.9 10*3/uL — ABNORMAL HIGH (ref 1.7–7.7)
Neutrophils Relative %: 88 %
Platelets: 72 10*3/uL — ABNORMAL LOW (ref 150–400)
RBC: 3.21 MIL/uL — ABNORMAL LOW (ref 3.87–5.11)
RDW: 14.9 % (ref 11.5–15.5)
WBC: 35.2 10*3/uL — ABNORMAL HIGH (ref 4.0–10.5)

## 2017-04-12 LAB — PREPARE RBC (CROSSMATCH)

## 2017-04-12 LAB — POCT I-STAT, CHEM 8
BUN: 10 mg/dL (ref 6–20)
Calcium, Ion: 0.97 mmol/L — ABNORMAL LOW (ref 1.15–1.40)
Chloride: 108 mmol/L (ref 101–111)
Creatinine, Ser: 0.8 mg/dL (ref 0.44–1.00)
Glucose, Bld: 213 mg/dL — ABNORMAL HIGH (ref 65–99)
HCT: 20 % — ABNORMAL LOW (ref 36.0–46.0)
Hemoglobin: 6.8 g/dL — CL (ref 12.0–15.0)
Potassium: 3.7 mmol/L (ref 3.5–5.1)
Sodium: 150 mmol/L — ABNORMAL HIGH (ref 135–145)
TCO2: 23 mmol/L (ref 22–32)

## 2017-04-12 LAB — BPAM FFP
Blood Product Expiration Date: 201902072359
Blood Product Expiration Date: 201902072359
ISSUE DATE / TIME: 201902022315
ISSUE DATE / TIME: 201902022316
Unit Type and Rh: 8400
Unit Type and Rh: 8400

## 2017-04-12 LAB — GLUCOSE, CAPILLARY
Glucose-Capillary: 227 mg/dL — ABNORMAL HIGH (ref 65–99)
Glucose-Capillary: 183 mg/dL — ABNORMAL HIGH (ref 65–99)
Glucose-Capillary: 158 mg/dL — ABNORMAL HIGH (ref 65–99)
Glucose-Capillary: 125 mg/dL — ABNORMAL HIGH (ref 65–99)
Glucose-Capillary: 120 mg/dL — ABNORMAL HIGH (ref 65–99)
Glucose-Capillary: 131 mg/dL — ABNORMAL HIGH (ref 65–99)

## 2017-04-12 LAB — LACTIC ACID, PLASMA
Lactic Acid, Venous: 8.7 mmol/L (ref 0.5–1.9)
Lactic Acid, Venous: 2.8 mmol/L (ref 0.5–1.9)

## 2017-04-12 LAB — MAGNESIUM: Magnesium: 1.5 mg/dL — ABNORMAL LOW (ref 1.7–2.4)

## 2017-04-12 LAB — PHOSPHORUS: Phosphorus: 7.3 mg/dL — ABNORMAL HIGH (ref 2.5–4.6)

## 2017-04-12 SURGERY — ESOPHAGOGASTRODUODENOSCOPY (EGD) WITH PROPOFOL
Anesthesia: Moderate Sedation

## 2017-04-12 MED ORDER — SODIUM CHLORIDE 0.9 % IV SOLN: Freq: Once | INTRAVENOUS | Status: DC

## 2017-04-12 MED ORDER — FENTANYL 2500MCG IN NS 250ML (10MCG/ML) PREMIX INFUSION
25.0000 ug/h | INTRAVENOUS | Status: DC
  Administered 2017-04-12 (×2): 150 ug/h via INTRAVENOUS
  Administered 2017-04-13: 125 ug/h via INTRAVENOUS
  Administered 2017-04-13: 150 ug/h via INTRAVENOUS
  Administered 2017-04-14: 125 ug/h via INTRAVENOUS
  Filled 2017-04-12 (×5): qty 250

## 2017-04-12 MED ORDER — FENTANYL CITRATE (PF) 100 MCG/2ML IJ SOLN
100.0000 ug | INTRAMUSCULAR | Status: DC | PRN
  Administered 2017-04-12: 25 ug via INTRAVENOUS
  Filled 2017-04-12: qty 2

## 2017-04-12 MED ORDER — DEXTROSE 5 % IV SOLN
INTRAVENOUS | Status: DC
  Administered 2017-04-12 – 2017-04-13 (×5): via INTRAVENOUS
  Filled 2017-04-12 (×8): qty 150

## 2017-04-12 MED ORDER — LIDOCAINE-EPINEPHRINE (PF) 1 %-1:200000 IJ SOLN
INTRAMUSCULAR | Status: AC
  Filled 2017-04-12: qty 30

## 2017-04-12 MED ORDER — FENTANYL CITRATE (PF) 100 MCG/2ML IJ SOLN: 100.0000 ug | INTRAMUSCULAR | Status: DC | PRN

## 2017-04-12 MED ORDER — PROTHROMBIN COMPLEX CONC HUMAN 500 UNITS IV KIT
3840.0000 [IU] | PACK | Status: AC
  Administered 2017-04-12: 3840 [IU] via INTRAVENOUS
  Filled 2017-04-12: qty 3000

## 2017-04-12 MED ORDER — POTASSIUM CHLORIDE 10 MEQ/50ML IV SOLN
10.0000 meq | INTRAVENOUS | Status: DC | PRN
  Administered 2017-04-12: 10 meq via INTRAVENOUS
  Filled 2017-04-12 (×3): qty 50

## 2017-04-12 MED ORDER — ETOMIDATE 2 MG/ML IV SOLN
20.0000 mg | Freq: Once | INTRAVENOUS | Status: AC
  Administered 2017-04-12: 20 mg via INTRAVENOUS
  Filled 2017-04-12: qty 10

## 2017-04-12 MED ORDER — ROCURONIUM BROMIDE 50 MG/5ML IV SOLN
50.0000 mg | Freq: Once | INTRAVENOUS | Status: AC
  Administered 2017-04-12: 50 mg via INTRAVENOUS

## 2017-04-12 MED ORDER — MIDAZOLAM HCL 5 MG/ML IJ SOLN
INTRAMUSCULAR | Status: AC
  Filled 2017-04-12: qty 2

## 2017-04-12 MED ORDER — SODIUM BICARBONATE 8.4 % IV SOLN
INTRAVENOUS | Status: AC
  Administered 2017-04-12: 50 meq via INTRAVENOUS
  Filled 2017-04-12: qty 100

## 2017-04-12 MED ORDER — NOREPINEPHRINE BITARTRATE 1 MG/ML IV SOLN
0.0000 ug/min | INTRAVENOUS | Status: DC
  Administered 2017-04-12: 17 ug/min via INTRAVENOUS
  Administered 2017-04-12: 18 ug/min via INTRAVENOUS
  Administered 2017-04-13: 22 ug/min via INTRAVENOUS
  Filled 2017-04-12 (×4): qty 4

## 2017-04-12 MED ORDER — FENTANYL CITRATE (PF) 100 MCG/2ML IJ SOLN
INTRAMUSCULAR | Status: AC
  Filled 2017-04-12: qty 4

## 2017-04-12 MED ORDER — MIDAZOLAM HCL 2 MG/2ML IJ SOLN
INTRAMUSCULAR | Status: AC | PRN
  Administered 2017-04-12: 1 mg via INTRAVENOUS

## 2017-04-12 MED ORDER — CALCIUM GLUCONATE 10 % IV SOLN
1.0000 g | Freq: Once | INTRAVENOUS | Status: AC
  Administered 2017-04-12: 1 g via INTRAVENOUS
  Filled 2017-04-12: qty 10

## 2017-04-12 MED ORDER — SODIUM CHLORIDE 0.9 % IV SOLN: 250.0000 mL | INTRAVENOUS | Status: DC | PRN

## 2017-04-12 MED ORDER — VASOPRESSIN 20 UNIT/ML IV SOLN
0.0300 [IU]/min | INTRAVENOUS | Status: DC
  Administered 2017-04-12: 0.03 [IU]/min via INTRAVENOUS
  Filled 2017-04-12 (×2): qty 2

## 2017-04-12 MED ORDER — IOPAMIDOL (ISOVUE-300) INJECTION 61%
INTRAVENOUS | Status: AC
  Administered 2017-04-12: 75 mL
  Filled 2017-04-12: qty 150

## 2017-04-12 MED ORDER — FENTANYL CITRATE (PF) 100 MCG/2ML IJ SOLN
50.0000 ug | Freq: Once | INTRAMUSCULAR | Status: AC
  Administered 2017-04-12: 50 ug via INTRAVENOUS

## 2017-04-12 MED ORDER — FENTANYL BOLUS VIA INFUSION
50.0000 ug | INTRAVENOUS | Status: DC | PRN
  Filled 2017-04-12: qty 50

## 2017-04-12 MED ORDER — MIDAZOLAM HCL 10 MG/2ML IJ SOLN
INTRAMUSCULAR | Status: DC | PRN
  Administered 2017-04-12: 2 mg via INTRAVENOUS

## 2017-04-12 MED ORDER — SODIUM CHLORIDE 0.9 % IV SOLN
2.0000 g | Freq: Once | INTRAVENOUS | Status: AC
  Administered 2017-04-12: 2 g via INTRAVENOUS
  Filled 2017-04-12: qty 20

## 2017-04-12 MED ORDER — CHLORHEXIDINE GLUCONATE 0.12% ORAL RINSE (MEDLINE KIT)
15.0000 mL | Freq: Two times a day (BID) | OROMUCOSAL | Status: DC
  Administered 2017-04-12 – 2017-04-15 (×7): 15 mL via OROMUCOSAL

## 2017-04-12 MED ORDER — MIDAZOLAM HCL 2 MG/2ML IJ SOLN
INTRAMUSCULAR | Status: AC
  Filled 2017-04-12: qty 4

## 2017-04-12 MED ORDER — ORAL CARE MOUTH RINSE
15.0000 mL | Freq: Four times a day (QID) | OROMUCOSAL | Status: DC
  Administered 2017-04-12 – 2017-04-15 (×13): 15 mL via OROMUCOSAL

## 2017-04-12 MED ORDER — SODIUM BICARBONATE 8.4 % IV SOLN
150.0000 meq | Freq: Once | INTRAVENOUS | Status: AC
  Administered 2017-04-12: 50 meq via INTRAVENOUS
  Filled 2017-04-12: qty 150

## 2017-04-12 MED ORDER — ATROPINE SULFATE 1 MG/10ML IJ SOSY
PREFILLED_SYRINGE | INTRAMUSCULAR | Status: AC
  Filled 2017-04-12: qty 10

## 2017-04-12 MED ORDER — INSULIN ASPART 100 UNIT/ML ~~LOC~~ SOLN
2.0000 [IU] | SUBCUTANEOUS | Status: DC
  Administered 2017-04-12: 4 [IU] via SUBCUTANEOUS
  Administered 2017-04-12 – 2017-04-13 (×5): 2 [IU] via SUBCUTANEOUS
  Administered 2017-04-13 (×2): 4 [IU] via SUBCUTANEOUS
  Administered 2017-04-13 – 2017-04-19 (×5): 2 [IU] via SUBCUTANEOUS
  Administered 2017-04-22: 4 [IU] via SUBCUTANEOUS

## 2017-04-12 MED ORDER — LIDOCAINE-EPINEPHRINE (PF) 1 %-1:200000 IJ SOLN
INTRAMUSCULAR | Status: AC | PRN
  Administered 2017-04-12: 10 mL

## 2017-04-12 MED ORDER — SODIUM BICARBONATE 8.4 % IV SOLN
INTRAVENOUS | Status: AC
  Administered 2017-04-12: 50 meq
  Filled 2017-04-12: qty 100

## 2017-04-12 MED ORDER — MIDAZOLAM HCL 2 MG/2ML IJ SOLN
2.0000 mg | INTRAMUSCULAR | Status: DC | PRN
  Administered 2017-04-12: 1 mg via INTRAVENOUS
  Filled 2017-04-12: qty 2

## 2017-04-12 MED ORDER — FENTANYL CITRATE (PF) 100 MCG/2ML IJ SOLN
INTRAMUSCULAR | Status: AC
  Filled 2017-04-12: qty 2

## 2017-04-12 MED ORDER — MIDAZOLAM HCL 2 MG/2ML IJ SOLN: 2.0000 mg | INTRAMUSCULAR | Status: DC | PRN

## 2017-04-12 MED ORDER — MAGNESIUM SULFATE 4 GM/100ML IV SOLN
4.0000 g | Freq: Once | INTRAVENOUS | Status: AC
  Administered 2017-04-12: 4 g via INTRAVENOUS
  Filled 2017-04-12: qty 100

## 2017-04-12 SURGICAL SUPPLY — 14 items

## 2017-04-12 NOTE — Progress Notes (Signed)
Patient ID: Kayla Wyatt, female   DOB: April 28, 1955, 62 y.o.   MRN: [id number]  PROGRESS NOTE    Kayla Wyatt  SNK:[id number] DOB: [birth year] DOA: 04/02/2017 PCP: [name], MD  Brief Narrative:  62 year old female with history of hypertension and hyperlipidemia presented on 04/02/2017 with abdominal pain and diarrhea. Her hemoglobin was 6.4. She was transfused packed red cells. GI was consulted. EGD showed gastric ulcer with visible vessel that was clipped. Patient developed new onset A. fib with RVR for which she was initially put on Cardizem drip which was switched to amiodarone drip. Cardiology was consulted. Patient was found to have right lower extremity DVT.Colonoscopy on January 31 showing nothing acute.  Started on Xarelto on 04/10/2017 after receiving 2 doses developed acute upper GI bleed again with gross hematemesis on 04/11/2017 transfused 4 units of blood and FFP.  On the night of 04/12/2017 became hypotensive and emergently needed to be intubated emergently had EGD and then subsequent embolization of her gastric artery by radiology with IVC filter placement.  Patient intubated on 04/12/2017 on pressor support in the ICU.      Assessment & Plan:   Principal Problem:   Gastrointestinal hemorrhage with melena-patient acutely started bleeding again yesterday morning.  Last night she received K Centra emergently when she became unstable.  She was intubated.  She is status post embolization of gastric artery and IVC filter placement by radiology last night.  She is currently on Levophed.  Sedated and intubated.  I have discussed the case with P CCM team who at this point will take over patient's care as primary team.  I have called her brother and updated him on her current condition.  Please feel free to call me with any further questions.  Active Problems:   Acute blood loss anemia-transfuse as needed.  Patient has no gross blood or coffee-ground emesis from NG tube suction  appears to have stopped at this point.       Paroxysmal atrial fibrillation with rapid ventricular response (HCC)-hold metoprolol at this time    Encounter for intubation-as above per pulmonary service  New lower extremity DVT during this hospitalization-status post IVC filter last night  Abnormal CT of chest with concerns for underlying malignant mass-this is not been biopsied due to other acute issues during this hospitalization  DVT prophylaxis: SCDs  code Status: Full Family Communication: Brother  disposition Plan: Pending clinical improvement     Subjective: Patient currently intubated and sedated, major deterioration last night, notes reviewed  Objective: Vitals:   04/12/17 0618 04/12/17 0700 04/12/17 0728 04/12/17 0800  BP:  (!) 102/49  (!) 90/49  Pulse: 79 (!) 54  (!) 57  Resp: 12 10  (!) 5  Temp:   97.7 F (36.5 C)   TempSrc:   Axillary   SpO2: 100% 100%  100%  Weight:      Height:        Intake/Output Summary (Last 24 hours) at 04/12/2017 0907 Last data filed at 04/12/2017 0800 Gross per 24 hour  Intake 6257.21 ml  Output 1001 ml  Net 5256.21 ml   Filed Weights   04/10/17 0400 04/11/17 0500 04/12/17 0056  Weight: 72.4 kg (159 lb 9.8 oz) 77.1 kg (169 lb 15.6 oz) 74.7 kg (164 lb 10.9 oz)    Examination:  General exam: Intubated and sedated Respiratory system: Clear to auscultation. Respiratory effort normal. Cardiovascular system: S1 & S2 heard, RRR. No JVD, murmurs, rubs, gallops or clicks. No pedal edema. Gastrointestinal system:  Abdomen is nondistended, soft and nontender. No organomegaly or masses felt. Normal bowel sounds heard. Central nervous system: Sedated appropriately  extremities: Symmetric 5 x 5 power. Skin: No rashes, lesions or ulcers   Data Reviewed: I have personally reviewed following labs and imaging studies  CBC: Recent Labs  Lab 04/08/17 0248  04/09/17 0255 04/10/17 0715 04/11/17 0328 04/12/17 0207 04/12/17 0358  04/12/17 0412  WBC 21.1*  --  15.2* 10.3 8.8  --   --  35.2*  NEUTROABS  --   --  11.6* 7.8* 6.5  --   --  30.9*  HGB 8.8*   < > 7.9* 8.4* 7.8* 6.7* 6.8* 9.6*  HCT 24.6*   < > 23.7* 25.3* 24.0* 19.5* 20.0* 28.3*  MCV 90.1  --  93.7 96.6 98.0  --   --  88.2  PLT 149*  --  145* 166 176  --   --  72*   < > = values in this interval not displayed.   Basic Metabolic Panel: Recent Labs  Lab 04/08/17 0248 04/09/17 0255 04/10/17 0243 04/11/17 0328 04/12/17 0342 04/12/17 0358  NA 144 143 143 143 149* 150*  K 3.3* 3.7 3.6 3.1* 4.0 3.7  CL 114* 115* 116* 113* 116* 108  CO2 22 23 20* 23 20*  --   GLUCOSE 113* 105* 95 109* 232* 213*  BUN '10 7 6 '$ 5* 12 10  CREATININE 0.55 0.56 0.62 0.60 1.16* 0.80  CALCIUM 7.3* 7.6* 7.7* 8.1* 6.3*  --   MG  --  1.6* 1.9  --  1.5*  --   PHOS  --   --   --   --  7.3*  --    GFR: Estimated Creatinine Clearance: 73.5 mL/min (by C-G formula based on SCr of 0.8 mg/dL). Liver Function Tests: Recent Labs  Lab 04/05/17 1504 04/07/17 1006 04/08/17 0248  AST 47* 24 20  ALT 70* 40 33  ALKPHOS 64 51 42  BILITOT 0.5 0.3 0.8  PROT 5.2* 4.4* 4.0*  ALBUMIN 2.2* 1.9* 1.9*   No results for input(s): LIPASE, AMYLASE in the last 168 hours. No results for input(s): AMMONIA in the last 168 hours. Coagulation Profile: No results for input(s): INR, PROTIME in the last 168 hours. Cardiac Enzymes: No results for input(s): CKTOTAL, CKMB, CKMBINDEX, TROPONINI in the last 168 hours. BNP (last 3 results) No results for input(s): PROBNP in the last 8760 hours. HbA1C: No results for input(s): HGBA1C in the last 72 hours. CBG: Recent Labs  Lab 04/12/17 0053 04/12/17 0335 04/12/17 0727  GLUCAP 227* 183* 158*   Lipid Profile: No results for input(s): CHOL, HDL, LDLCALC, TRIG, CHOLHDL, LDLDIRECT in the last 72 hours. Thyroid Function Tests: No results for input(s): TSH, T4TOTAL, FREET4, T3FREE, THYROIDAB in the last 72 hours. Anemia Panel: No results for input(s):  VITAMINB12, FOLATE, FERRITIN, TIBC, IRON, RETICCTPCT in the last 72 hours. Sepsis Labs: Recent Labs  Lab 04/06/17 0914 04/12/17 0412  PROCALCITON 0.13  --   LATICACIDVEN  --  8.7*    Recent Results (from the past 240 hour(s))  Culture, blood (Routine x 2)     Status: Abnormal   Collection Time: 04/02/17  3:40 PM  Result Value Ref Range Status   Specimen Description BLOOD RIGHT FOREARM  Final   Special Requests   Final    BOTTLES DRAWN AEROBIC AND ANAEROBIC Blood Culture adequate volume   Culture  Setup Time   Final    GRAM NEGATIVE RODS AEROBIC  BOTTLE ONLY CRITICAL RESULT CALLED TO, READ BACK BY AND VERIFIED WITH: JCARNEY,PHARMD @ 2219 04/03/17 BY LHOWARD    Culture ESCHERICHIA COLI (A)  Final   Report Status 04/05/2017 FINAL  Final   Organism ID, Bacteria ESCHERICHIA COLI  Final      Susceptibility   Escherichia coli - MIC*    AMPICILLIN <=2 SENSITIVE Sensitive     CEFAZOLIN <=4 SENSITIVE Sensitive     CEFEPIME <=1 SENSITIVE Sensitive     CEFTAZIDIME <=1 SENSITIVE Sensitive     CEFTRIAXONE <=1 SENSITIVE Sensitive     CIPROFLOXACIN <=0.25 SENSITIVE Sensitive     GENTAMICIN <=1 SENSITIVE Sensitive     IMIPENEM <=0.25 SENSITIVE Sensitive     TRIMETH/SULFA <=20 SENSITIVE Sensitive     AMPICILLIN/SULBACTAM <=2 SENSITIVE Sensitive     PIP/TAZO <=4 SENSITIVE Sensitive     Extended ESBL NEGATIVE Sensitive     * ESCHERICHIA COLI  Blood Culture ID Panel (Reflexed)     Status: Abnormal   Collection Time: 04/02/17  3:40 PM  Result Value Ref Range Status   Enterococcus species NOT DETECTED NOT DETECTED Final   Listeria monocytogenes NOT DETECTED NOT DETECTED Final   Staphylococcus species NOT DETECTED NOT DETECTED Final   Staphylococcus aureus NOT DETECTED NOT DETECTED Final   Streptococcus species NOT DETECTED NOT DETECTED Final   Streptococcus agalactiae NOT DETECTED NOT DETECTED Final   Streptococcus pneumoniae NOT DETECTED NOT DETECTED Final   Streptococcus pyogenes NOT  DETECTED NOT DETECTED Final   Acinetobacter baumannii NOT DETECTED NOT DETECTED Final   Enterobacteriaceae species DETECTED (A) NOT DETECTED Final    Comment: Enterobacteriaceae represent a large family of gram-negative bacteria, not a single organism. CRITICAL RESULT CALLED TO, READ BACK BY AND VERIFIED WITH: JCARNEY,PHARMD '@2219'$  04/03/17 BY LHOWARD    Enterobacter cloacae complex NOT DETECTED NOT DETECTED Final   Escherichia coli DETECTED (A) NOT DETECTED Final    Comment: CRITICAL RESULT CALLED TO, READ BACK BY AND VERIFIED WITH: JCARNEY,PHARMD '@2219'$  04/03/17 BY LHOWARD    Klebsiella oxytoca NOT DETECTED NOT DETECTED Final   Klebsiella pneumoniae NOT DETECTED NOT DETECTED Final   Proteus species NOT DETECTED NOT DETECTED Final   Serratia marcescens NOT DETECTED NOT DETECTED Final   Carbapenem resistance NOT DETECTED NOT DETECTED Final   Haemophilus influenzae NOT DETECTED NOT DETECTED Final   Neisseria meningitidis NOT DETECTED NOT DETECTED Final   Pseudomonas aeruginosa NOT DETECTED NOT DETECTED Final   Candida albicans NOT DETECTED NOT DETECTED Final   Candida glabrata NOT DETECTED NOT DETECTED Final   Candida krusei NOT DETECTED NOT DETECTED Final   Candida parapsilosis NOT DETECTED NOT DETECTED Final   Candida tropicalis NOT DETECTED NOT DETECTED Final  Culture, blood (Routine x 2)     Status: None   Collection Time: 04/02/17  3:44 PM  Result Value Ref Range Status   Specimen Description BLOOD RIGHT FOREARM  Final   Special Requests   Final    BOTTLES DRAWN AEROBIC AND ANAEROBIC Blood Culture adequate volume   Culture NO GROWTH 5 DAYS  Final   Report Status 04/07/2017 FINAL  Final  Urine culture     Status: Abnormal   Collection Time: 04/02/17  5:05 PM  Result Value Ref Range Status   Specimen Description URINE, RANDOM  Final   Special Requests NONE  Final   Culture MULTIPLE SPECIES PRESENT, SUGGEST RECOLLECTION (A)  Final   Report Status 04/03/2017 FINAL  Final  MRSA  PCR Screening  Status: None   Collection Time: 04/02/17  8:43 PM  Result Value Ref Range Status   MRSA by PCR NEGATIVE NEGATIVE Final    Comment:        The GeneXpert MRSA Assay (FDA approved for NASAL specimens only), is one component of a comprehensive MRSA colonization surveillance program. It is not intended to diagnose MRSA infection nor to guide or monitor treatment for MRSA infections.   Culture, blood (routine x 2)     Status: None   Collection Time: 04/04/17  3:16 PM  Result Value Ref Range Status   Specimen Description BLOOD LEFT ANTECUBITAL  Final   Special Requests IN PEDIATRIC BOTTLE Blood Culture adequate volume  Final   Culture NO GROWTH 5 DAYS  Final   Report Status 04/09/2017 FINAL  Final  Culture, blood (routine x 2)     Status: None   Collection Time: 04/04/17  3:19 PM  Result Value Ref Range Status   Specimen Description BLOOD LEFT HAND  Final   Special Requests IN PEDIATRIC BOTTLE Blood Culture adequate volume  Final   Culture NO GROWTH 5 DAYS  Final   Report Status 04/09/2017 FINAL  Final  Culture, Urine     Status: None   Collection Time: 04/05/17 12:19 PM  Result Value Ref Range Status   Specimen Description URINE, RANDOM  Final   Special Requests NONE  Final   Culture NO GROWTH  Final   Report Status 04/06/2017 FINAL  Final         Radiology Studies: Ir Angiogram Visceral Selective  Result Date: 04/12/2017 INDICATION: History of recent upper GI bleed, post endoscopy approximately 1 week ago resulting in placement of an endoscopy clip at the location of a bleeding gastric ulcer. Patient subsequently found to have a lower extremity DVT and was started on anti coagulation (patient has received 2 doses of Xarelto). Unfortunately, this resulted in a recurrent large upper GI bleed. Repeat upper endoscopy demonstrates a large amount of blood products within the stomach with inability to achieve hemostasis from endoscopic approach. As such, request made  for emergent mesenteric arteriogram and potential percutaneous coil embolization as well as placement of an IVC filter for temporary caval interruption purposes. EXAM: 1. ULTRASOUND GUIDANCE FOR VENOUS AND ARTERIAL ACCESS 2. SELECTIVE CELIAC ARTERIOGRAM. 3. SELECTIVE ACCESSORY LEFT GASTRIC ARTERIOGRAM. 4. SELECTIVE ARTERIOGRAM OF DISTAL TRIBUTARY OF THE LEFT GASTRIC ARTERY (3rd ORDER) AND PERCUTANEOUS COIL EMBOLIZATION 5. IVC CATHETERIZATION AND VENOGRAM 6. IVC FILTER INSERTION COMPARISON:  CTA of the abdomen pelvis - 04/03/2017; chest CT - 04/07/2017 MEDICATIONS: None. ANESTHESIA/SEDATION: Versed 58 mg IV Sedation Time: minutes; The patient was continuously monitored during the procedure by the interventional radiology nurse under my direct supervision. CONTRAST:  75 cc Isovue 300 FLUOROSCOPY TIME:  9 Minutes 24 seconds (188 mGy) COMPLICATIONS: None immediate. PROCEDURE: Informed written consent was obtained from the patient's brother following explanation of the procedure, risks, benefits and alternatives. A time out was performed prior to the initiation of the procedure. The right groin was prepped and draped in usual sterile fashion. Maximal barrier sterile technique utilized including caps, mask, sterile gowns, sterile gloves, large sterile drape, hand hygiene, and chlorhexidine prep. The right femoral head was marked fluoroscopically. Under ultrasound guidance, the right common femoral artery was accessed with a micropuncture kit after the overlying soft tissues were anesthetized with 1% lidocaine. An ultrasound image was saved for documentation purposes. The micropuncture sheath was exchanged for a 6 [place] vascular sheath over a Bentson wire. (  Note, a 6 [place] vascular sheath was selected for access given suspected hemodynamically significant stenosis within the mid aspect of the abdominal aorta and potential necessity for angioplasty.) A closure arteriogram was performed through the side of the sheath  confirming access within the right common femoral artery, however the 6 [place] vascular sheath was noted to be near occlusive secondary to a combination of atherosclerotic plaque as well as the vasospasm. With the use of a Kumpe catheter, a Bentson wire was advanced to the level of the caudal aspect of the thoracic aorta. Limited abdominal aortogram was performed demonstrating patency of the abdominal aorta with the 5 French catheter in place and as such abdominal aortic angioplasty would not be necessary and the decision was made to downsized the vascular sheath a 5 [place]. Again, closure arteriogram was performed demonstrating preferential flow through the right internal iliac artery with atretic flow surrounding the vascular sheath. Given patient's critical state the decision was made to proceed with the mesenteric arteriogram and embolization in an expeditious manner in lieu of abandoning the femoral access for a brachial approach. Over a Bentson wire, a Mickelson catheter was advanced to the level of the thoracic aorta where it was back bled and flushed. The catheter was then utilized to select the celiac artery and a selective celiac arteriogram was performed The Mickelson catheter was then utilized to select the left gastric artery and a selective left gastric arteriogram was performed. With the use of an 0.014 fathom microwire, a regular Renegade microcatheter was advanced into a distal tributary of the left gastric artery serving as the predominant arterial supply to the tangle of vessels regional to the endoscopy clip. Selective injection confirmed appropriate positioning. The distal aspect of the branch vessel was then percutaneously coil embolized with multiple overlapping 2 mm and 3 mm soft and regular interlock coils. The microcatheter was withdrawn into the central aspect of the left gastric artery and a post embolization arteriogram was performed. The microcatheter was removed and a completion left  gastric arteriogram was performed via the Mickelson catheter Again, the Mickelson catheter was utilized to select the celiac artery and selective celiac arteriograms were performed in various obliquities. Images were reviewed and the arteriogram portion of the procedure was terminated. Completion closure arteriogram was performed. All wires, catheters and sheaths were removed from the patient. Hemostasis was achieved at the right groin access site with deployment of a ExoSeal closure device and manual compression. _________________________________________________________ Next, attention was made towards placement of the IVC filter. Under sterile condition and local anesthesia, right common femoral venous access was performed with ultrasound. An ultrasound image was saved and sent to PACS. Over a guidewire, the IVC filter delivery sheath and inner dilator were advanced into the IVC just above the IVC bifurcation. Contrast injection was performed for an IVC venogram. Through the delivery sheath, a retrievable Denali IVC filter was deployed below the level of the renal veins and above the IVC bifurcation. Limited post deployment venacavagram was performed. The delivery sheath was removed and hemostasis was obtained with manual compression. A dressing was placed. The patient tolerated the procedure well without immediate post procedural complication. FINDINGS: Selective celiac arteriogram demonstrates non conventional branching pattern without supply to the left gastric artery or significant supply to the gastric fundus, as was demonstrated on preceding CTA. As such, the accessory left gastric artery arising directly from the abdominal aorta was selected with sub selective arteriogram demonstrating a tangle of blood vessels contributing arterial supply to the portion  of the stomach containing the endoscopy clip. Caudal division of the accessory left gastric artery supplies the dominant arterial contribution to the  portion of the stomach containing the endoscopy clip and as such, was percutaneously coil embolized with multiple overlapping 2 and 3 mm interlock coils to near the vessel's origin. Completion arteriogram demonstrates a technically excellent result with no definitive persistent arterial supply regional to the endoscopy clip. Following the percutaneous coil embolization, several additional selective arteriograms were performed in various obliquities, again confirming no definitive supply to the gastric fundus. As such, additional embolization was not performed. The IVC is patent. No evidence of thrombus, stenosis, or occlusion. No variant venous anatomy. Successful placement of the IVC filter below the level of the renal veins. IMPRESSION: 1. Technically successful percutaneous coil embolization of the caudal division of the accessory left gastric artery serving as the predominant arterial supply to the portion of the stomach containing the endoscopy clip. 2. Selective celiac arteriogram fails to delineate significant arterial supply to the gastric fundus and in particular, to the portion of stomach regional to the endoscopy clip. 3. Successful ultrasound and fluoroscopically guided placement of an infrarenal retrievable IVC filter. PLAN: - Patient is to lie flat for 4 hours. Continued aggressive resuscitation as per the critical care team. Note, as the patient has experienced an acute large bleeding episode, I would expect the patient's H and H has not yet reached a nadir but hopefully will stabilize in the next 24-48 hours. - The IVC filter is potentially retrievable. The patient will be assessed for filter retrieval by Interventional Radiology in approximately 8-12 weeks. Further recommendations regarding filter retrieval, continued surveillance or declaration of device permanence, will be made at that time. Electronically Signed   By: Sandi Mariscal M.D.   On: 04/12/2017 07:04   Ir Angiogram Selective Each  Additional Vessel  Result Date: 04/12/2017 INDICATION: History of recent upper GI bleed, post endoscopy approximately 1 week ago resulting in placement of an endoscopy clip at the location of a bleeding gastric ulcer. Patient subsequently found to have a lower extremity DVT and was started on anti coagulation (patient has received 2 doses of Xarelto). Unfortunately, this resulted in a recurrent large upper GI bleed. Repeat upper endoscopy demonstrates a large amount of blood products within the stomach with inability to achieve hemostasis from endoscopic approach. As such, request made for emergent mesenteric arteriogram and potential percutaneous coil embolization as well as placement of an IVC filter for temporary caval interruption purposes. EXAM: 1. ULTRASOUND GUIDANCE FOR VENOUS AND ARTERIAL ACCESS 2. SELECTIVE CELIAC ARTERIOGRAM. 3. SELECTIVE ACCESSORY LEFT GASTRIC ARTERIOGRAM. 4. SELECTIVE ARTERIOGRAM OF DISTAL TRIBUTARY OF THE LEFT GASTRIC ARTERY (3rd ORDER) AND PERCUTANEOUS COIL EMBOLIZATION 5. IVC CATHETERIZATION AND VENOGRAM 6. IVC FILTER INSERTION COMPARISON:  CTA of the abdomen pelvis - 04/03/2017; chest CT - 04/07/2017 MEDICATIONS: None. ANESTHESIA/SEDATION: Versed 58 mg IV Sedation Time: minutes; The patient was continuously monitored during the procedure by the interventional radiology nurse under my direct supervision. CONTRAST:  75 cc Isovue 300 FLUOROSCOPY TIME:  9 Minutes 24 seconds (657 mGy) COMPLICATIONS: None immediate. PROCEDURE: Informed written consent was obtained from the patient's brother following explanation of the procedure, risks, benefits and alternatives. A time out was performed prior to the initiation of the procedure. The right groin was prepped and draped in usual sterile fashion. Maximal barrier sterile technique utilized including caps, mask, sterile gowns, sterile gloves, large sterile drape, hand hygiene, and chlorhexidine prep. The right femoral head was marked  fluoroscopically. Under ultrasound guidance, the right common femoral artery was accessed with a micropuncture kit after the overlying soft tissues were anesthetized with 1% lidocaine. An ultrasound image was saved for documentation purposes. The micropuncture sheath was exchanged for a 6 [place] vascular sheath over a Bentson wire. (Note, a 6 [place] vascular sheath was selected for access given suspected hemodynamically significant stenosis within the mid aspect of the abdominal aorta and potential necessity for angioplasty.) A closure arteriogram was performed through the side of the sheath confirming access within the right common femoral artery, however the 6 [place] vascular sheath was noted to be near occlusive secondary to a combination of atherosclerotic plaque as well as the vasospasm. With the use of a Kumpe catheter, a Bentson wire was advanced to the level of the caudal aspect of the thoracic aorta. Limited abdominal aortogram was performed demonstrating patency of the abdominal aorta with the 5 French catheter in place and as such abdominal aortic angioplasty would not be necessary and the decision was made to downsized the vascular sheath a 5 [place]. Again, closure arteriogram was performed demonstrating preferential flow through the right internal iliac artery with atretic flow surrounding the vascular sheath. Given patient's critical state the decision was made to proceed with the mesenteric arteriogram and embolization in an expeditious manner in lieu of abandoning the femoral access for a brachial approach. Over a Bentson wire, a Mickelson catheter was advanced to the level of the thoracic aorta where it was back bled and flushed. The catheter was then utilized to select the celiac artery and a selective celiac arteriogram was performed The Mickelson catheter was then utilized to select the left gastric artery and a selective left gastric arteriogram was performed. With the use of an 0.014 fathom  microwire, a regular Renegade microcatheter was advanced into a distal tributary of the left gastric artery serving as the predominant arterial supply to the tangle of vessels regional to the endoscopy clip. Selective injection confirmed appropriate positioning. The distal aspect of the branch vessel was then percutaneously coil embolized with multiple overlapping 2 mm and 3 mm soft and regular interlock coils. The microcatheter was withdrawn into the central aspect of the left gastric artery and a post embolization arteriogram was performed. The microcatheter was removed and a completion left gastric arteriogram was performed via the Mickelson catheter Again, the Mickelson catheter was utilized to select the celiac artery and selective celiac arteriograms were performed in various obliquities. Images were reviewed and the arteriogram portion of the procedure was terminated. Completion closure arteriogram was performed. All wires, catheters and sheaths were removed from the patient. Hemostasis was achieved at the right groin access site with deployment of a ExoSeal closure device and manual compression. _________________________________________________________ Next, attention was made towards placement of the IVC filter. Under sterile condition and local anesthesia, right common femoral venous access was performed with ultrasound. An ultrasound image was saved and sent to PACS. Over a guidewire, the IVC filter delivery sheath and inner dilator were advanced into the IVC just above the IVC bifurcation. Contrast injection was performed for an IVC venogram. Through the delivery sheath, a retrievable Denali IVC filter was deployed below the level of the renal veins and above the IVC bifurcation. Limited post deployment venacavagram was performed. The delivery sheath was removed and hemostasis was obtained with manual compression. A dressing was placed. The patient tolerated the procedure well without immediate post  procedural complication. FINDINGS: Selective celiac arteriogram demonstrates non conventional branching pattern without supply to  the left gastric artery or significant supply to the gastric fundus, as was demonstrated on preceding CTA. As such, the accessory left gastric artery arising directly from the abdominal aorta was selected with sub selective arteriogram demonstrating a tangle of blood vessels contributing arterial supply to the portion of the stomach containing the endoscopy clip. Caudal division of the accessory left gastric artery supplies the dominant arterial contribution to the portion of the stomach containing the endoscopy clip and as such, was percutaneously coil embolized with multiple overlapping 2 and 3 mm interlock coils to near the vessel's origin. Completion arteriogram demonstrates a technically excellent result with no definitive persistent arterial supply regional to the endoscopy clip. Following the percutaneous coil embolization, several additional selective arteriograms were performed in various obliquities, again confirming no definitive supply to the gastric fundus. As such, additional embolization was not performed. The IVC is patent. No evidence of thrombus, stenosis, or occlusion. No variant venous anatomy. Successful placement of the IVC filter below the level of the renal veins. IMPRESSION: 1. Technically successful percutaneous coil embolization of the caudal division of the accessory left gastric artery serving as the predominant arterial supply to the portion of the stomach containing the endoscopy clip. 2. Selective celiac arteriogram fails to delineate significant arterial supply to the gastric fundus and in particular, to the portion of stomach regional to the endoscopy clip. 3. Successful ultrasound and fluoroscopically guided placement of an infrarenal retrievable IVC filter. PLAN: - Patient is to lie flat for 4 hours. Continued aggressive resuscitation as per the  critical care team. Note, as the patient has experienced an acute large bleeding episode, I would expect the patient's H and H has not yet reached a nadir but hopefully will stabilize in the next 24-48 hours. - The IVC filter is potentially retrievable. The patient will be assessed for filter retrieval by Interventional Radiology in approximately 8-12 weeks. Further recommendations regarding filter retrieval, continued surveillance or declaration of device permanence, will be made at that time. Electronically Signed   By: Sandi Mariscal M.D.   On: 04/12/2017 07:04   Ir Angiogram Follow Up Study  Result Date: 04/12/2017 INDICATION: History of recent upper GI bleed, post endoscopy approximately 1 week ago resulting in placement of an endoscopy clip at the location of a bleeding gastric ulcer. Patient subsequently found to have a lower extremity DVT and was started on anti coagulation (patient has received 2 doses of Xarelto). Unfortunately, this resulted in a recurrent large upper GI bleed. Repeat upper endoscopy demonstrates a large amount of blood products within the stomach with inability to achieve hemostasis from endoscopic approach. As such, request made for emergent mesenteric arteriogram and potential percutaneous coil embolization as well as placement of an IVC filter for temporary caval interruption purposes. EXAM: 1. ULTRASOUND GUIDANCE FOR VENOUS AND ARTERIAL ACCESS 2. SELECTIVE CELIAC ARTERIOGRAM. 3. SELECTIVE ACCESSORY LEFT GASTRIC ARTERIOGRAM. 4. SELECTIVE ARTERIOGRAM OF DISTAL TRIBUTARY OF THE LEFT GASTRIC ARTERY (3rd ORDER) AND PERCUTANEOUS COIL EMBOLIZATION 5. IVC CATHETERIZATION AND VENOGRAM 6. IVC FILTER INSERTION COMPARISON:  CTA of the abdomen pelvis - 04/03/2017; chest CT - 04/07/2017 MEDICATIONS: None. ANESTHESIA/SEDATION: Versed 58 mg IV Sedation Time: minutes; The patient was continuously monitored during the procedure by the interventional radiology nurse under my direct supervision.  CONTRAST:  75 cc Isovue 300 FLUOROSCOPY TIME:  9 Minutes 24 seconds (841 mGy) COMPLICATIONS: None immediate. PROCEDURE: Informed written consent was obtained from the patient's brother following explanation of the procedure, risks, benefits and alternatives. A time out  was performed prior to the initiation of the procedure. The right groin was prepped and draped in usual sterile fashion. Maximal barrier sterile technique utilized including caps, mask, sterile gowns, sterile gloves, large sterile drape, hand hygiene, and chlorhexidine prep. The right femoral head was marked fluoroscopically. Under ultrasound guidance, the right common femoral artery was accessed with a micropuncture kit after the overlying soft tissues were anesthetized with 1% lidocaine. An ultrasound image was saved for documentation purposes. The micropuncture sheath was exchanged for a 6 [place] vascular sheath over a Bentson wire. (Note, a 6 [place] vascular sheath was selected for access given suspected hemodynamically significant stenosis within the mid aspect of the abdominal aorta and potential necessity for angioplasty.) A closure arteriogram was performed through the side of the sheath confirming access within the right common femoral artery, however the 6 [place] vascular sheath was noted to be near occlusive secondary to a combination of atherosclerotic plaque as well as the vasospasm. With the use of a Kumpe catheter, a Bentson wire was advanced to the level of the caudal aspect of the thoracic aorta. Limited abdominal aortogram was performed demonstrating patency of the abdominal aorta with the 5 French catheter in place and as such abdominal aortic angioplasty would not be necessary and the decision was made to downsized the vascular sheath a 5 [place]. Again, closure arteriogram was performed demonstrating preferential flow through the right internal iliac artery with atretic flow surrounding the vascular sheath. Given patient's critical  state the decision was made to proceed with the mesenteric arteriogram and embolization in an expeditious manner in lieu of abandoning the femoral access for a brachial approach. Over a Bentson wire, a Mickelson catheter was advanced to the level of the thoracic aorta where it was back bled and flushed. The catheter was then utilized to select the celiac artery and a selective celiac arteriogram was performed The Mickelson catheter was then utilized to select the left gastric artery and a selective left gastric arteriogram was performed. With the use of an 0.014 fathom microwire, a regular Renegade microcatheter was advanced into a distal tributary of the left gastric artery serving as the predominant arterial supply to the tangle of vessels regional to the endoscopy clip. Selective injection confirmed appropriate positioning. The distal aspect of the branch vessel was then percutaneously coil embolized with multiple overlapping 2 mm and 3 mm soft and regular interlock coils. The microcatheter was withdrawn into the central aspect of the left gastric artery and a post embolization arteriogram was performed. The microcatheter was removed and a completion left gastric arteriogram was performed via the Mickelson catheter Again, the Mickelson catheter was utilized to select the celiac artery and selective celiac arteriograms were performed in various obliquities. Images were reviewed and the arteriogram portion of the procedure was terminated. Completion closure arteriogram was performed. All wires, catheters and sheaths were removed from the patient. Hemostasis was achieved at the right groin access site with deployment of a ExoSeal closure device and manual compression. _________________________________________________________ Next, attention was made towards placement of the IVC filter. Under sterile condition and local anesthesia, right common femoral venous access was performed with ultrasound. An ultrasound image  was saved and sent to PACS. Over a guidewire, the IVC filter delivery sheath and inner dilator were advanced into the IVC just above the IVC bifurcation. Contrast injection was performed for an IVC venogram. Through the delivery sheath, a retrievable Denali IVC filter was deployed below the level of the renal veins and above the IVC  bifurcation. Limited post deployment venacavagram was performed. The delivery sheath was removed and hemostasis was obtained with manual compression. A dressing was placed. The patient tolerated the procedure well without immediate post procedural complication. FINDINGS: Selective celiac arteriogram demonstrates non conventional branching pattern without supply to the left gastric artery or significant supply to the gastric fundus, as was demonstrated on preceding CTA. As such, the accessory left gastric artery arising directly from the abdominal aorta was selected with sub selective arteriogram demonstrating a tangle of blood vessels contributing arterial supply to the portion of the stomach containing the endoscopy clip. Caudal division of the accessory left gastric artery supplies the dominant arterial contribution to the portion of the stomach containing the endoscopy clip and as such, was percutaneously coil embolized with multiple overlapping 2 and 3 mm interlock coils to near the vessel's origin. Completion arteriogram demonstrates a technically excellent result with no definitive persistent arterial supply regional to the endoscopy clip. Following the percutaneous coil embolization, several additional selective arteriograms were performed in various obliquities, again confirming no definitive supply to the gastric fundus. As such, additional embolization was not performed. The IVC is patent. No evidence of thrombus, stenosis, or occlusion. No variant venous anatomy. Successful placement of the IVC filter below the level of the renal veins. IMPRESSION: 1. Technically successful  percutaneous coil embolization of the caudal division of the accessory left gastric artery serving as the predominant arterial supply to the portion of the stomach containing the endoscopy clip. 2. Selective celiac arteriogram fails to delineate significant arterial supply to the gastric fundus and in particular, to the portion of stomach regional to the endoscopy clip. 3. Successful ultrasound and fluoroscopically guided placement of an infrarenal retrievable IVC filter. PLAN: - Patient is to lie flat for 4 hours. Continued aggressive resuscitation as per the critical care team. Note, as the patient has experienced an acute large bleeding episode, I would expect the patient's H and H has not yet reached a nadir but hopefully will stabilize in the next 24-48 hours. - The IVC filter is potentially retrievable. The patient will be assessed for filter retrieval by Interventional Radiology in approximately 8-12 weeks. Further recommendations regarding filter retrieval, continued surveillance or declaration of device permanence, will be made at that time. Electronically Signed   By: Sandi Mariscal M.D.   On: 04/12/2017 07:04   Ir Ivc Filter Plmt / S&i /img Guid/mod Sed  Result Date: 04/12/2017 INDICATION: History of recent upper GI bleed, post endoscopy approximately 1 week ago resulting in placement of an endoscopy clip at the location of a bleeding gastric ulcer. Patient subsequently found to have a lower extremity DVT and was started on anti coagulation (patient has received 2 doses of Xarelto). Unfortunately, this resulted in a recurrent large upper GI bleed. Repeat upper endoscopy demonstrates a large amount of blood products within the stomach with inability to achieve hemostasis from endoscopic approach. As such, request made for emergent mesenteric arteriogram and potential percutaneous coil embolization as well as placement of an IVC filter for temporary caval interruption purposes. EXAM: 1. ULTRASOUND GUIDANCE  FOR VENOUS AND ARTERIAL ACCESS 2. SELECTIVE CELIAC ARTERIOGRAM. 3. SELECTIVE ACCESSORY LEFT GASTRIC ARTERIOGRAM. 4. SELECTIVE ARTERIOGRAM OF DISTAL TRIBUTARY OF THE LEFT GASTRIC ARTERY (3rd ORDER) AND PERCUTANEOUS COIL EMBOLIZATION 5. IVC CATHETERIZATION AND VENOGRAM 6. IVC FILTER INSERTION COMPARISON:  CTA of the abdomen pelvis - 04/03/2017; chest CT - 04/07/2017 MEDICATIONS: None. ANESTHESIA/SEDATION: Versed 58 mg IV Sedation Time: minutes; The patient was continuously monitored during the  procedure by the interventional radiology nurse under my direct supervision. CONTRAST:  75 cc Isovue 300 FLUOROSCOPY TIME:  9 Minutes 24 seconds (696 mGy) COMPLICATIONS: None immediate. PROCEDURE: Informed written consent was obtained from the patient's brother following explanation of the procedure, risks, benefits and alternatives. A time out was performed prior to the initiation of the procedure. The right groin was prepped and draped in usual sterile fashion. Maximal barrier sterile technique utilized including caps, mask, sterile gowns, sterile gloves, large sterile drape, hand hygiene, and chlorhexidine prep. The right femoral head was marked fluoroscopically. Under ultrasound guidance, the right common femoral artery was accessed with a micropuncture kit after the overlying soft tissues were anesthetized with 1% lidocaine. An ultrasound image was saved for documentation purposes. The micropuncture sheath was exchanged for a 6 [place] vascular sheath over a Bentson wire. (Note, a 6 [place] vascular sheath was selected for access given suspected hemodynamically significant stenosis within the mid aspect of the abdominal aorta and potential necessity for angioplasty.) A closure arteriogram was performed through the side of the sheath confirming access within the right common femoral artery, however the 6 [place] vascular sheath was noted to be near occlusive secondary to a combination of atherosclerotic plaque as well as the  vasospasm. With the use of a Kumpe catheter, a Bentson wire was advanced to the level of the caudal aspect of the thoracic aorta. Limited abdominal aortogram was performed demonstrating patency of the abdominal aorta with the 5 French catheter in place and as such abdominal aortic angioplasty would not be necessary and the decision was made to downsized the vascular sheath a 5 [place]. Again, closure arteriogram was performed demonstrating preferential flow through the right internal iliac artery with atretic flow surrounding the vascular sheath. Given patient's critical state the decision was made to proceed with the mesenteric arteriogram and embolization in an expeditious manner in lieu of abandoning the femoral access for a brachial approach. Over a Bentson wire, a Mickelson catheter was advanced to the level of the thoracic aorta where it was back bled and flushed. The catheter was then utilized to select the celiac artery and a selective celiac arteriogram was performed The Mickelson catheter was then utilized to select the left gastric artery and a selective left gastric arteriogram was performed. With the use of an 0.014 fathom microwire, a regular Renegade microcatheter was advanced into a distal tributary of the left gastric artery serving as the predominant arterial supply to the tangle of vessels regional to the endoscopy clip. Selective injection confirmed appropriate positioning. The distal aspect of the branch vessel was then percutaneously coil embolized with multiple overlapping 2 mm and 3 mm soft and regular interlock coils. The microcatheter was withdrawn into the central aspect of the left gastric artery and a post embolization arteriogram was performed. The microcatheter was removed and a completion left gastric arteriogram was performed via the Mickelson catheter Again, the Mickelson catheter was utilized to select the celiac artery and selective celiac arteriograms were performed in various  obliquities. Images were reviewed and the arteriogram portion of the procedure was terminated. Completion closure arteriogram was performed. All wires, catheters and sheaths were removed from the patient. Hemostasis was achieved at the right groin access site with deployment of a ExoSeal closure device and manual compression. _________________________________________________________ Next, attention was made towards placement of the IVC filter. Under sterile condition and local anesthesia, right common femoral venous access was performed with ultrasound. An ultrasound image was saved and sent to PACS. Over  a guidewire, the IVC filter delivery sheath and inner dilator were advanced into the IVC just above the IVC bifurcation. Contrast injection was performed for an IVC venogram. Through the delivery sheath, a retrievable Denali IVC filter was deployed below the level of the renal veins and above the IVC bifurcation. Limited post deployment venacavagram was performed. The delivery sheath was removed and hemostasis was obtained with manual compression. A dressing was placed. The patient tolerated the procedure well without immediate post procedural complication. FINDINGS: Selective celiac arteriogram demonstrates non conventional branching pattern without supply to the left gastric artery or significant supply to the gastric fundus, as was demonstrated on preceding CTA. As such, the accessory left gastric artery arising directly from the abdominal aorta was selected with sub selective arteriogram demonstrating a tangle of blood vessels contributing arterial supply to the portion of the stomach containing the endoscopy clip. Caudal division of the accessory left gastric artery supplies the dominant arterial contribution to the portion of the stomach containing the endoscopy clip and as such, was percutaneously coil embolized with multiple overlapping 2 and 3 mm interlock coils to near the vessel's origin. Completion  arteriogram demonstrates a technically excellent result with no definitive persistent arterial supply regional to the endoscopy clip. Following the percutaneous coil embolization, several additional selective arteriograms were performed in various obliquities, again confirming no definitive supply to the gastric fundus. As such, additional embolization was not performed. The IVC is patent. No evidence of thrombus, stenosis, or occlusion. No variant venous anatomy. Successful placement of the IVC filter below the level of the renal veins. IMPRESSION: 1. Technically successful percutaneous coil embolization of the caudal division of the accessory left gastric artery serving as the predominant arterial supply to the portion of the stomach containing the endoscopy clip. 2. Selective celiac arteriogram fails to delineate significant arterial supply to the gastric fundus and in particular, to the portion of stomach regional to the endoscopy clip. 3. Successful ultrasound and fluoroscopically guided placement of an infrarenal retrievable IVC filter. PLAN: - Patient is to lie flat for 4 hours. Continued aggressive resuscitation as per the critical care team. Note, as the patient has experienced an acute large bleeding episode, I would expect the patient's H and H has not yet reached a nadir but hopefully will stabilize in the next 24-48 hours. - The IVC filter is potentially retrievable. The patient will be assessed for filter retrieval by Interventional Radiology in approximately 8-12 weeks. Further recommendations regarding filter retrieval, continued surveillance or declaration of device permanence, will be made at that time. Electronically Signed   By: Sandi Mariscal M.D.   On: 04/12/2017 07:04   Ir US Guide Vasc Access Right  Result Date: 04/12/2017 INDICATION: History of recent upper GI bleed, post endoscopy approximately 1 week ago resulting in placement of an endoscopy clip at the location of a bleeding gastric  ulcer. Patient subsequently found to have a lower extremity DVT and was started on anti coagulation (patient has received 2 doses of Xarelto). Unfortunately, this resulted in a recurrent large upper GI bleed. Repeat upper endoscopy demonstrates a large amount of blood products within the stomach with inability to achieve hemostasis from endoscopic approach. As such, request made for emergent mesenteric arteriogram and potential percutaneous coil embolization as well as placement of an IVC filter for temporary caval interruption purposes. EXAM: 1. ULTRASOUND GUIDANCE FOR VENOUS AND ARTERIAL ACCESS 2. SELECTIVE CELIAC ARTERIOGRAM. 3. SELECTIVE ACCESSORY LEFT GASTRIC ARTERIOGRAM. 4. SELECTIVE ARTERIOGRAM OF DISTAL TRIBUTARY OF THE LEFT  GASTRIC ARTERY (3rd ORDER) AND PERCUTANEOUS COIL EMBOLIZATION 5. IVC CATHETERIZATION AND VENOGRAM 6. IVC FILTER INSERTION COMPARISON:  CTA of the abdomen pelvis - 04/03/2017; chest CT - 04/07/2017 MEDICATIONS: None. ANESTHESIA/SEDATION: Versed 58 mg IV Sedation Time: minutes; The patient was continuously monitored during the procedure by the interventional radiology nurse under my direct supervision. CONTRAST:  75 cc Isovue 300 FLUOROSCOPY TIME:  9 Minutes 24 seconds (270 mGy) COMPLICATIONS: None immediate. PROCEDURE: Informed written consent was obtained from the patient's brother following explanation of the procedure, risks, benefits and alternatives. A time out was performed prior to the initiation of the procedure. The right groin was prepped and draped in usual sterile fashion. Maximal barrier sterile technique utilized including caps, mask, sterile gowns, sterile gloves, large sterile drape, hand hygiene, and chlorhexidine prep. The right femoral head was marked fluoroscopically. Under ultrasound guidance, the right common femoral artery was accessed with a micropuncture kit after the overlying soft tissues were anesthetized with 1% lidocaine. An ultrasound image was saved for  documentation purposes. The micropuncture sheath was exchanged for a 6 [place] vascular sheath over a Bentson wire. (Note, a 6 [place] vascular sheath was selected for access given suspected hemodynamically significant stenosis within the mid aspect of the abdominal aorta and potential necessity for angioplasty.) A closure arteriogram was performed through the side of the sheath confirming access within the right common femoral artery, however the 6 [place] vascular sheath was noted to be near occlusive secondary to a combination of atherosclerotic plaque as well as the vasospasm. With the use of a Kumpe catheter, a Bentson wire was advanced to the level of the caudal aspect of the thoracic aorta. Limited abdominal aortogram was performed demonstrating patency of the abdominal aorta with the 5 French catheter in place and as such abdominal aortic angioplasty would not be necessary and the decision was made to downsized the vascular sheath a 5 [place]. Again, closure arteriogram was performed demonstrating preferential flow through the right internal iliac artery with atretic flow surrounding the vascular sheath. Given patient's critical state the decision was made to proceed with the mesenteric arteriogram and embolization in an expeditious manner in lieu of abandoning the femoral access for a brachial approach. Over a Bentson wire, a Mickelson catheter was advanced to the level of the thoracic aorta where it was back bled and flushed. The catheter was then utilized to select the celiac artery and a selective celiac arteriogram was performed The Mickelson catheter was then utilized to select the left gastric artery and a selective left gastric arteriogram was performed. With the use of an 0.014 fathom microwire, a regular Renegade microcatheter was advanced into a distal tributary of the left gastric artery serving as the predominant arterial supply to the tangle of vessels regional to the endoscopy clip. Selective  injection confirmed appropriate positioning. The distal aspect of the branch vessel was then percutaneously coil embolized with multiple overlapping 2 mm and 3 mm soft and regular interlock coils. The microcatheter was withdrawn into the central aspect of the left gastric artery and a post embolization arteriogram was performed. The microcatheter was removed and a completion left gastric arteriogram was performed via the Mickelson catheter Again, the Mickelson catheter was utilized to select the celiac artery and selective celiac arteriograms were performed in various obliquities. Images were reviewed and the arteriogram portion of the procedure was terminated. Completion closure arteriogram was performed. All wires, catheters and sheaths were removed from the patient. Hemostasis was achieved at the right groin access  site with deployment of a ExoSeal closure device and manual compression. _________________________________________________________ Next, attention was made towards placement of the IVC filter. Under sterile condition and local anesthesia, right common femoral venous access was performed with ultrasound. An ultrasound image was saved and sent to PACS. Over a guidewire, the IVC filter delivery sheath and inner dilator were advanced into the IVC just above the IVC bifurcation. Contrast injection was performed for an IVC venogram. Through the delivery sheath, a retrievable Denali IVC filter was deployed below the level of the renal veins and above the IVC bifurcation. Limited post deployment venacavagram was performed. The delivery sheath was removed and hemostasis was obtained with manual compression. A dressing was placed. The patient tolerated the procedure well without immediate post procedural complication. FINDINGS: Selective celiac arteriogram demonstrates non conventional branching pattern without supply to the left gastric artery or significant supply to the gastric fundus, as was demonstrated on  preceding CTA. As such, the accessory left gastric artery arising directly from the abdominal aorta was selected with sub selective arteriogram demonstrating a tangle of blood vessels contributing arterial supply to the portion of the stomach containing the endoscopy clip. Caudal division of the accessory left gastric artery supplies the dominant arterial contribution to the portion of the stomach containing the endoscopy clip and as such, was percutaneously coil embolized with multiple overlapping 2 and 3 mm interlock coils to near the vessel's origin. Completion arteriogram demonstrates a technically excellent result with no definitive persistent arterial supply regional to the endoscopy clip. Following the percutaneous coil embolization, several additional selective arteriograms were performed in various obliquities, again confirming no definitive supply to the gastric fundus. As such, additional embolization was not performed. The IVC is patent. No evidence of thrombus, stenosis, or occlusion. No variant venous anatomy. Successful placement of the IVC filter below the level of the renal veins. IMPRESSION: 1. Technically successful percutaneous coil embolization of the caudal division of the accessory left gastric artery serving as the predominant arterial supply to the portion of the stomach containing the endoscopy clip. 2. Selective celiac arteriogram fails to delineate significant arterial supply to the gastric fundus and in particular, to the portion of stomach regional to the endoscopy clip. 3. Successful ultrasound and fluoroscopically guided placement of an infrarenal retrievable IVC filter. PLAN: - Patient is to lie flat for 4 hours. Continued aggressive resuscitation as per the critical care team. Note, as the patient has experienced an acute large bleeding episode, I would expect the patient's H and H has not yet reached a nadir but hopefully will stabilize in the next 24-48 hours. - The IVC filter is  potentially retrievable. The patient will be assessed for filter retrieval by Interventional Radiology in approximately 8-12 weeks. Further recommendations regarding filter retrieval, continued surveillance or declaration of device permanence, will be made at that time. Electronically Signed   By: Sandi Mariscal M.D.   On: 04/12/2017 07:04   Dg Chest Port 1 View  Result Date: 04/12/2017 CLINICAL DATA:  Central line placement EXAM: PORTABLE CHEST 1 VIEW COMPARISON:  04/12/2017 FINDINGS: Endotracheal tube tip measures 4.7 cm above the carina. A left jugular central venous catheter has been placed. Tip is over the mid mediastinum consistent with location in the brachiocephalic vein. No pneumothorax. Mild cardiac enlargement. Developing pulmonary vascular congestion with developing perihilar infiltration suggesting edema. No blunting of costophrenic angles. IMPRESSION: Left central venous catheter tip projects over the brachiocephalic vein. No pneumothorax. Developing cardiac enlargement with perihilar edema. Electronically Signed  By: Lucienne Capers M.D.   On: 04/12/2017 04:05   Dg Chest Port 1 View  Result Date: 04/12/2017 CLINICAL DATA:  Intubation EXAM: PORTABLE CHEST 1 VIEW COMPARISON:  CT chest 04/07/2017.  Chest 04/03/2017 FINDINGS: Shallow inspiration. Heart size and pulmonary vascularity are normal. 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 An endotracheal tube is been placed with tip measuring 4.8 cm above the carina. Lungs are clear. No blunting of costophrenic angles. No pneumothorax. IMPRESSION: Endotracheal tube placed with tip measuring 4.8 cm above the carina. Lungs are clear. Electronically Signed   By: Lucienne Capers M.D.   On: 04/12/2017 01:22   Mobile Guide Roadmapping  Result Date: 04/12/2017 INDICATION: History of recent upper GI bleed, post endoscopy approximately 1 week ago resulting in placement of an endoscopy  clip at the location of a bleeding gastric ulcer. Patient subsequently found to have a lower extremity DVT and was started on anti coagulation (patient has received 2 doses of Xarelto). Unfortunately, this resulted in a recurrent large upper GI bleed. Repeat upper endoscopy demonstrates a large amount of blood products within the stomach with inability to achieve hemostasis from endoscopic approach. As such, request made for emergent mesenteric arteriogram and potential percutaneous coil embolization as well as placement of an IVC filter for temporary caval interruption purposes. EXAM: 1. ULTRASOUND GUIDANCE FOR VENOUS AND ARTERIAL ACCESS 2. SELECTIVE CELIAC ARTERIOGRAM. 3. SELECTIVE ACCESSORY LEFT GASTRIC ARTERIOGRAM. 4. SELECTIVE ARTERIOGRAM OF DISTAL TRIBUTARY OF THE LEFT GASTRIC ARTERY (3rd ORDER) AND PERCUTANEOUS COIL EMBOLIZATION 5. IVC CATHETERIZATION AND VENOGRAM 6. IVC FILTER INSERTION COMPARISON:  CTA of the abdomen pelvis - 04/03/2017; chest CT - 04/07/2017 MEDICATIONS: None. ANESTHESIA/SEDATION: Versed 58 mg IV Sedation Time: minutes; The patient was continuously monitored during the procedure by the interventional radiology nurse under my direct supervision. CONTRAST:  75 cc Isovue 300 FLUOROSCOPY TIME:  9 Minutes 24 seconds (174 mGy) COMPLICATIONS: None immediate. PROCEDURE: Informed written consent was obtained from the patient's brother following explanation of the procedure, risks, benefits and alternatives. A time out was performed prior to the initiation of the procedure. The right groin was prepped and draped in usual sterile fashion. Maximal barrier sterile technique utilized including caps, mask, sterile gowns, sterile gloves, large sterile drape, hand hygiene, and chlorhexidine prep. The right femoral head was marked fluoroscopically. Under ultrasound guidance, the right common femoral artery was accessed with a micropuncture kit after the overlying soft tissues were anesthetized with 1%  lidocaine. An ultrasound image was saved for documentation purposes. The micropuncture sheath was exchanged for a 6 [place] vascular sheath over a Bentson wire. (Note, a 6 [place] vascular sheath was selected for access given suspected hemodynamically significant stenosis within the mid aspect of the abdominal aorta and potential necessity for angioplasty.) A closure arteriogram was performed through the side of the sheath confirming access within the right common femoral artery, however the 6 [place] vascular sheath was noted to be near occlusive secondary to a combination of atherosclerotic plaque as well as the vasospasm. With the use of a Kumpe catheter, a Bentson wire was advanced to the level of the caudal aspect of the thoracic aorta. Limited abdominal aortogram was performed demonstrating patency of the abdominal aorta with the 5 French catheter in place and as such abdominal aortic angioplasty would not be necessary and the decision was made to downsized the vascular sheath a 5 [place]. Again, closure arteriogram was performed demonstrating preferential  flow through the right internal iliac artery with atretic flow surrounding the vascular sheath. Given patient's critical state the decision was made to proceed with the mesenteric arteriogram and embolization in an expeditious manner in lieu of abandoning the femoral access for a brachial approach. Over a Bentson wire, a Mickelson catheter was advanced to the level of the thoracic aorta where it was back bled and flushed. The catheter was then utilized to select the celiac artery and a selective celiac arteriogram was performed The Mickelson catheter was then utilized to select the left gastric artery and a selective left gastric arteriogram was performed. With the use of an 0.014 fathom microwire, a regular Renegade microcatheter was advanced into a distal tributary of the left gastric artery serving as the predominant arterial supply to the tangle of vessels  regional to the endoscopy clip. Selective injection confirmed appropriate positioning. The distal aspect of the branch vessel was then percutaneously coil embolized with multiple overlapping 2 mm and 3 mm soft and regular interlock coils. The microcatheter was withdrawn into the central aspect of the left gastric artery and a post embolization arteriogram was performed. The microcatheter was removed and a completion left gastric arteriogram was performed via the Mickelson catheter Again, the Mickelson catheter was utilized to select the celiac artery and selective celiac arteriograms were performed in various obliquities. Images were reviewed and the arteriogram portion of the procedure was terminated. Completion closure arteriogram was performed. All wires, catheters and sheaths were removed from the patient. Hemostasis was achieved at the right groin access site with deployment of a ExoSeal closure device and manual compression. _________________________________________________________ Next, attention was made towards placement of the IVC filter. Under sterile condition and local anesthesia, right common femoral venous access was performed with ultrasound. An ultrasound image was saved and sent to PACS. Over a guidewire, the IVC filter delivery sheath and inner dilator were advanced into the IVC just above the IVC bifurcation. Contrast injection was performed for an IVC venogram. Through the delivery sheath, a retrievable Denali IVC filter was deployed below the level of the renal veins and above the IVC bifurcation. Limited post deployment venacavagram was performed. The delivery sheath was removed and hemostasis was obtained with manual compression. A dressing was placed. The patient tolerated the procedure well without immediate post procedural complication. FINDINGS: Selective celiac arteriogram demonstrates non conventional branching pattern without supply to the left gastric artery or significant supply to  the gastric fundus, as was demonstrated on preceding CTA. As such, the accessory left gastric artery arising directly from the abdominal aorta was selected with sub selective arteriogram demonstrating a tangle of blood vessels contributing arterial supply to the portion of the stomach containing the endoscopy clip. Caudal division of the accessory left gastric artery supplies the dominant arterial contribution to the portion of the stomach containing the endoscopy clip and as such, was percutaneously coil embolized with multiple overlapping 2 and 3 mm interlock coils to near the vessel's origin. Completion arteriogram demonstrates a technically excellent result with no definitive persistent arterial supply regional to the endoscopy clip. Following the percutaneous coil embolization, several additional selective arteriograms were performed in various obliquities, again confirming no definitive supply to the gastric fundus. As such, additional embolization was not performed. The IVC is patent. No evidence of thrombus, stenosis, or occlusion. No variant venous anatomy. Successful placement of the IVC filter below the level of the renal veins. IMPRESSION: 1. Technically successful percutaneous coil embolization of the caudal division of the accessory left  gastric artery serving as the predominant arterial supply to the portion of the stomach containing the endoscopy clip. 2. Selective celiac arteriogram fails to delineate significant arterial supply to the gastric fundus and in particular, to the portion of stomach regional to the endoscopy clip. 3. Successful ultrasound and fluoroscopically guided placement of an infrarenal retrievable IVC filter. PLAN: - Patient is to lie flat for 4 hours. Continued aggressive resuscitation as per the critical care team. Note, as the patient has experienced an acute large bleeding episode, I would expect the patient's H and H has not yet reached a nadir but hopefully will stabilize in  the next 24-48 hours. - The IVC filter is potentially retrievable. The patient will be assessed for filter retrieval by Interventional Radiology in approximately 8-12 weeks. Further recommendations regarding filter retrieval, continued surveillance or declaration of device permanence, will be made at that time. Electronically Signed   By: Sandi Mariscal M.D.   On: 04/12/2017 07:04        Scheduled Meds: . Gerhardt's butt cream   Topical TID  . insulin aspart  2-6 Units Subcutaneous Q4H  . lidocaine-EPINEPHrine      . midazolam      . pantoprazole  40 mg Intravenous Q12H  . sodium chloride flush  3 mL Intravenous Q12H   Continuous Infusions: . sodium chloride    . sodium chloride    . fentaNYL infusion INTRAVENOUS 150 mcg/hr (04/12/17 0800)  . norepinephrine (LEVOPHED) Adult infusion 17 mcg/min (04/12/17 0809)  .  sodium bicarbonate  infusion 1000 mL 125 mL/hr at 04/12/17 0800  . vasopressin (PITRESSIN) infusion - *FOR SHOCK*       LOS: 10 days    Time spent: 40 minutes    Pleshette Tomasini A, MD [facility] Pager [phone]  If 7PM-7AM, please contact night-coverage [email or web address] Password [email or web address] 04/12/2017, 9:07 AM

## 2017-04-12 NOTE — Procedures (Signed)
Pre-procedure Diagnosis: Upper GI bleed; DVT with contraindication to anticoagulation Post-procedure Diagnosis: Same  Post Mesenteric arteriogram and percutaneous coil embolization of distal tributary of the left gastric artery for acute upper GI bleed. Post infra-renal IVC filter placement    Complications: None Immediate  EBL: None  Keep right leg straight for 4 hrs.    SignedSandi Mariscal Pager: [phone] 04/12/2017, 6:05 AM

## 2017-04-12 NOTE — Procedures (Signed)
Central Venous Catheter Insertion Procedure Note Kayla Wyatt [id number] [birth year]  Procedure: Insertion of Central Venous Catheter Indications: Assessment of intravascular volume, Drug and/or fluid administration and Frequent blood sampling  Procedure Details Consent: Unable to obtain consent because of emergent medical necessity. Time Out: Verified patient identification, verified procedure, site/side was marked, verified correct patient position, special equipment/implants available, medications/allergies/relevent history reviewed, required imaging and test results available.  Performed  Maximum sterile technique was used including antiseptics, cap, gloves, gown, hand hygiene, mask and sheet. Skin prep: Chlorhexidine; local anesthetic administered A antimicrobial bonded/coated triple lumen catheter was placed in the left internal jugular vein using the Seldinger technique.  Evaluation Blood flow good Complications: No apparent complications Patient did tolerate procedure well. Chest X-ray ordered to verify placement.  CXR: pending.  Hayden Pedro, AGACNP-BC [facility]  Pgr: [phone]  PCCM Pgr: [phone]

## 2017-04-12 NOTE — Progress Notes (Signed)
NP notified of ABG. PH 6.9-54/513/12.9. RR increased to 18

## 2017-04-12 NOTE — Consult Note (Signed)
PULMONARY / CRITICAL CARE MEDICINE   Name: Kayla Wyatt MRN: [id number] DOB: [birth year]    ADMISSION DATE:  04/02/2017 CONSULTATION DATE:  04/12/2017  REFERRING MD:  Dr. Shanon Brow   CHIEF COMPLAINT:  GI Bleed   HISTORY OF PRESENT ILLNESS:   62 year old female with PMH of HTN, HLD  Presented to ED On 1/24 with reported two days of ABD pain and bloody diarrhea. States she has had 15+ bowel movements in the morning of arrival. CT A/P with suspicion for ileus. GI consulted. EGD 1/25 with single visible vessel in the setting of a mild ulceration was found in the stomach in which was clipped. Patient continued to have anemia, hypotension, and melena. Was taken for colonoscopy on 1/31 in which revealed diverticulosis. 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 2/2 patient had hemoptysis and Xarelto was stopped, given 4 units RBCs. & 2 FFP . 2/3 in the early morning patient was found minimally responsive and hypotensive, requiring intubation and vent support . [name] Transferred to ICU and GI called.   She was given Xarelto 4 factor [place] reversal agent at 12:45 on 2/3. 1 pack of platelets . 2 additional PRBC She was started on pressor support.   SUBJECTIVE:  Pt resting on vent , sedated. No active bleeding noted.  B/p is improved on pressors/Levophed  S/p IR intervention with coli embolization of left gastric artery , and IVC filter placement   VITAL SIGNS: BP (!) 102/49   Pulse (!) 54   Temp 97.7 F (36.5 C) (Axillary)   Resp 10   Ht _0  (1.676 m)   Wt 164 lb 10.9 oz (74.7 kg)   SpO2 100%   BMI 26.58 kg/m   HEMODYNAMICS:    VENTILATOR SETTINGS: Vent Mode: PRVC FiO2 (%):  [40 %-100 %] 40 % Set Rate:  [12 bmp-18 bmp] 18 bmp Vt Set:  [420 mL] 420 mL PEEP:  [5 cmH20] 5 cmH20 Plateau Pressure:  [18 cmH20] 18 cmH20  INTAKE / OUTPUT: I/O  last 3 completed shifts: In: 5755.3 [P.O.:370; I.V.:1318.8; Blood:3016.5; IV Piggyback:1050] Out: 676 [Emesis/NG output:100; Stool:576]  PHYSICAL EXAMINATION: General:  Elderly female, intubated, pale  Neuro:  Sedated, moves extremities, pupils intact , opens eyes to voice  HEENT:  ETT in place, dry blood noted to mouth  Cardiovascular:  RRR , SB , no MRG  Lungs:  Clear breath sounds, no wheeze/crackles  Abdomen: Distended, hypoactive bowel sounds , soft  Musculoskeletal:  +1 BLE edema  Skin:  Warm, dry, intact   LABS:  BMET Recent Labs  Lab 04/10/17 0243 04/11/17 0328 04/12/17 0342 04/12/17 0358  NA 143 143 149* 150*  K 3.6 3.1* 4.0 3.7  CL 116* 113* 116* 108  CO2 20* 23 20*  --   BUN 6 5* 12 10  CREATININE 0.62 0.60 1.16* 0.80  GLUCOSE 95 109* 232* 213*    Electrolytes Recent Labs  Lab 04/09/17 0255 04/10/17 0243 04/11/17 0328 04/12/17 0342  CALCIUM 7.6* 7.7* 8.1* 6.3*  MG 1.6* 1.9  --  1.5*  PHOS  --   --   --  7.3*    CBC Recent Labs  Lab 04/10/17 0715 04/11/17 0328 04/12/17 0207 04/12/17 0358 04/12/17 0412  WBC 10.3 8.8  --   --  35.2*  HGB 8.4* 7.8* 6.7* 6.8*  9.6*  HCT 25.3* 24.0* 19.5* 20.0* 28.3*  PLT 166 176  --   --  72*    Coag's No results for input(s): APTT, INR in the last 168 hours.  Sepsis Markers Recent Labs  Lab 04/06/17 0914 04/12/17 0412  LATICACIDVEN  --  8.7*  PROCALCITON 0.13  --     ABG Recent Labs  Lab 04/12/17 0152 04/12/17 0347  PHART 6.913* 7.195*  PCO2ART 63.8* 55.7*  PO2ART 511.0* 403.0*    Liver Enzymes Recent Labs  Lab 04/05/17 1504 04/07/17 1006 04/08/17 0248  AST 47* 24 20  ALT 70* 40 33  ALKPHOS 64 51 42  BILITOT 0.5 0.3 0.8  ALBUMIN 2.2* 1.9* 1.9*    Cardiac Enzymes No results for input(s): TROPONINI, PROBNP in the last 168 hours.  Glucose Recent Labs  Lab 04/12/17 0053 04/12/17 0335 04/12/17 0727  GLUCAP 227* 183* 158*    Imaging Ir Angiogram Visceral Selective  Result  Date: 04/12/2017 INDICATION: History of recent upper GI bleed, post endoscopy approximately 1 week ago resulting in placement of an endoscopy clip at the location of a bleeding gastric ulcer. Patient subsequently found to have a lower extremity DVT and was started on anti coagulation (patient has received 2 doses of Xarelto). Unfortunately, this resulted in a recurrent large upper GI bleed. Repeat upper endoscopy demonstrates a large amount of blood products within the stomach with inability to achieve hemostasis from endoscopic approach. As such, request made for emergent mesenteric arteriogram and potential percutaneous coil embolization as well as placement of an IVC filter for temporary caval interruption purposes. EXAM: 1. ULTRASOUND GUIDANCE FOR VENOUS AND ARTERIAL ACCESS 2. SELECTIVE CELIAC ARTERIOGRAM. 3. SELECTIVE ACCESSORY LEFT GASTRIC ARTERIOGRAM. 4. SELECTIVE ARTERIOGRAM OF DISTAL TRIBUTARY OF THE LEFT GASTRIC ARTERY (3rd ORDER) AND PERCUTANEOUS COIL EMBOLIZATION 5. IVC CATHETERIZATION AND VENOGRAM 6. IVC FILTER INSERTION COMPARISON:  CTA of the abdomen pelvis - 04/03/2017; chest CT - 04/07/2017 MEDICATIONS: None. ANESTHESIA/SEDATION: Versed 58 mg IV Sedation Time: minutes; The patient was continuously monitored during the procedure by the interventional radiology nurse under my direct supervision. CONTRAST:  75 cc Isovue 300 FLUOROSCOPY TIME:  9 Minutes 24 seconds (161 mGy) COMPLICATIONS: None immediate. PROCEDURE: Informed written consent was obtained from the patient's brother following explanation of the procedure, risks, benefits and alternatives. A time out was performed prior to the initiation of the procedure. The right groin was prepped and draped in usual sterile fashion. Maximal barrier sterile technique utilized including caps, mask, sterile gowns, sterile gloves, large sterile drape, hand hygiene, and chlorhexidine prep. The right femoral head was marked fluoroscopically. Under ultrasound  guidance, the right common femoral artery was accessed with a micropuncture kit after the overlying soft tissues were anesthetized with 1% lidocaine. An ultrasound image was saved for documentation purposes. The micropuncture sheath was exchanged for a 6 [place] vascular sheath over a Bentson wire. (Note, a 6 [place] vascular sheath was selected for access given suspected hemodynamically significant stenosis within the mid aspect of the abdominal aorta and potential necessity for angioplasty.) A closure arteriogram was performed through the side of the sheath confirming access within the right common femoral artery, however the 6 [place] vascular sheath was noted to be near occlusive secondary to a combination of atherosclerotic plaque as well as the vasospasm. With the use of a Kumpe catheter, a Bentson wire was advanced to the level of the caudal aspect of the thoracic aorta. Limited abdominal aortogram was performed demonstrating patency of the abdominal aorta with  the 5 French catheter in place and as such abdominal aortic angioplasty would not be necessary and the decision was made to downsized the vascular sheath a 5 [place]. Again, closure arteriogram was performed demonstrating preferential flow through the right internal iliac artery with atretic flow surrounding the vascular sheath. Given patient's critical state the decision was made to proceed with the mesenteric arteriogram and embolization in an expeditious manner in lieu of abandoning the femoral access for a brachial approach. Over a Bentson wire, a Mickelson catheter was advanced to the level of the thoracic aorta where it was back bled and flushed. The catheter was then utilized to select the celiac artery and a selective celiac arteriogram was performed The Mickelson catheter was then utilized to select the left gastric artery and a selective left gastric arteriogram was performed. With the use of an 0.014 fathom microwire, a regular Renegade  microcatheter was advanced into a distal tributary of the left gastric artery serving as the predominant arterial supply to the tangle of vessels regional to the endoscopy clip. Selective injection confirmed appropriate positioning. The distal aspect of the branch vessel was then percutaneously coil embolized with multiple overlapping 2 mm and 3 mm soft and regular interlock coils. The microcatheter was withdrawn into the central aspect of the left gastric artery and a post embolization arteriogram was performed. The microcatheter was removed and a completion left gastric arteriogram was performed via the Mickelson catheter Again, the Mickelson catheter was utilized to select the celiac artery and selective celiac arteriograms were performed in various obliquities. Images were reviewed and the arteriogram portion of the procedure was terminated. Completion closure arteriogram was performed. All wires, catheters and sheaths were removed from the patient. Hemostasis was achieved at the right groin access site with deployment of a ExoSeal closure device and manual compression. _________________________________________________________ Next, attention was made towards placement of the IVC filter. Under sterile condition and local anesthesia, right common femoral venous access was performed with ultrasound. An ultrasound image was saved and sent to PACS. Over a guidewire, the IVC filter delivery sheath and inner dilator were advanced into the IVC just above the IVC bifurcation. Contrast injection was performed for an IVC venogram. Through the delivery sheath, a retrievable Denali IVC filter was deployed below the level of the renal veins and above the IVC bifurcation. Limited post deployment venacavagram was performed. The delivery sheath was removed and hemostasis was obtained with manual compression. A dressing was placed. The patient tolerated the procedure well without immediate post procedural complication. FINDINGS:  Selective celiac arteriogram demonstrates non conventional branching pattern without supply to the left gastric artery or significant supply to the gastric fundus, as was demonstrated on preceding CTA. As such, the accessory left gastric artery arising directly from the abdominal aorta was selected with sub selective arteriogram demonstrating a tangle of blood vessels contributing arterial supply to the portion of the stomach containing the endoscopy clip. Caudal division of the accessory left gastric artery supplies the dominant arterial contribution to the portion of the stomach containing the endoscopy clip and as such, was percutaneously coil embolized with multiple overlapping 2 and 3 mm interlock coils to near the vessel's origin. Completion arteriogram demonstrates a technically excellent result with no definitive persistent arterial supply regional to the endoscopy clip. Following the percutaneous coil embolization, several additional selective arteriograms were performed in various obliquities, again confirming no definitive supply to the gastric fundus. As such, additional embolization was not performed. The IVC is patent. No evidence of  thrombus, stenosis, or occlusion. No variant venous anatomy. Successful placement of the IVC filter below the level of the renal veins. IMPRESSION: 1. Technically successful percutaneous coil embolization of the caudal division of the accessory left gastric artery serving as the predominant arterial supply to the portion of the stomach containing the endoscopy clip. 2. Selective celiac arteriogram fails to delineate significant arterial supply to the gastric fundus and in particular, to the portion of stomach regional to the endoscopy clip. 3. Successful ultrasound and fluoroscopically guided placement of an infrarenal retrievable IVC filter. PLAN: - Patient is to lie flat for 4 hours. Continued aggressive resuscitation as per the critical care team. Note, as the patient  has experienced an acute large bleeding episode, I would expect the patient's H and H has not yet reached a nadir but hopefully will stabilize in the next 24-48 hours. - The IVC filter is potentially retrievable. The patient will be assessed for filter retrieval by Interventional Radiology in approximately 8-12 weeks. Further recommendations regarding filter retrieval, continued surveillance or declaration of device permanence, will be made at that time. Electronically Signed   By: Sandi Mariscal M.D.   On: 04/12/2017 07:04   Ir Angiogram Selective Each Additional Vessel  Result Date: 04/12/2017 INDICATION: History of recent upper GI bleed, post endoscopy approximately 1 week ago resulting in placement of an endoscopy clip at the location of a bleeding gastric ulcer. Patient subsequently found to have a lower extremity DVT and was started on anti coagulation (patient has received 2 doses of Xarelto). Unfortunately, this resulted in a recurrent large upper GI bleed. Repeat upper endoscopy demonstrates a large amount of blood products within the stomach with inability to achieve hemostasis from endoscopic approach. As such, request made for emergent mesenteric arteriogram and potential percutaneous coil embolization as well as placement of an IVC filter for temporary caval interruption purposes. EXAM: 1. ULTRASOUND GUIDANCE FOR VENOUS AND ARTERIAL ACCESS 2. SELECTIVE CELIAC ARTERIOGRAM. 3. SELECTIVE ACCESSORY LEFT GASTRIC ARTERIOGRAM. 4. SELECTIVE ARTERIOGRAM OF DISTAL TRIBUTARY OF THE LEFT GASTRIC ARTERY (3rd ORDER) AND PERCUTANEOUS COIL EMBOLIZATION 5. IVC CATHETERIZATION AND VENOGRAM 6. IVC FILTER INSERTION COMPARISON:  CTA of the abdomen pelvis - 04/03/2017; chest CT - 04/07/2017 MEDICATIONS: None. ANESTHESIA/SEDATION: Versed 58 mg IV Sedation Time: minutes; The patient was continuously monitored during the procedure by the interventional radiology nurse under my direct supervision. CONTRAST:  75 cc Isovue 300  FLUOROSCOPY TIME:  9 Minutes 24 seconds (979 mGy) COMPLICATIONS: None immediate. PROCEDURE: Informed written consent was obtained from the patient's brother following explanation of the procedure, risks, benefits and alternatives. A time out was performed prior to the initiation of the procedure. The right groin was prepped and draped in usual sterile fashion. Maximal barrier sterile technique utilized including caps, mask, sterile gowns, sterile gloves, large sterile drape, hand hygiene, and chlorhexidine prep. The right femoral head was marked fluoroscopically. Under ultrasound guidance, the right common femoral artery was accessed with a micropuncture kit after the overlying soft tissues were anesthetized with 1% lidocaine. An ultrasound image was saved for documentation purposes. The micropuncture sheath was exchanged for a 6 [place] vascular sheath over a Bentson wire. (Note, a 6 [place] vascular sheath was selected for access given suspected hemodynamically significant stenosis within the mid aspect of the abdominal aorta and potential necessity for angioplasty.) A closure arteriogram was performed through the side of the sheath confirming access within the right common femoral artery, however the 6 [place] vascular sheath was noted to be near occlusive  secondary to a combination of atherosclerotic plaque as well as the vasospasm. With the use of a Kumpe catheter, a Bentson wire was advanced to the level of the caudal aspect of the thoracic aorta. Limited abdominal aortogram was performed demonstrating patency of the abdominal aorta with the 5 French catheter in place and as such abdominal aortic angioplasty would not be necessary and the decision was made to downsized the vascular sheath a 5 [place]. Again, closure arteriogram was performed demonstrating preferential flow through the right internal iliac artery with atretic flow surrounding the vascular sheath. Given patient's critical state the decision was made  to proceed with the mesenteric arteriogram and embolization in an expeditious manner in lieu of abandoning the femoral access for a brachial approach. Over a Bentson wire, a Mickelson catheter was advanced to the level of the thoracic aorta where it was back bled and flushed. The catheter was then utilized to select the celiac artery and a selective celiac arteriogram was performed The Mickelson catheter was then utilized to select the left gastric artery and a selective left gastric arteriogram was performed. With the use of an 0.014 fathom microwire, a regular Renegade microcatheter was advanced into a distal tributary of the left gastric artery serving as the predominant arterial supply to the tangle of vessels regional to the endoscopy clip. Selective injection confirmed appropriate positioning. The distal aspect of the branch vessel was then percutaneously coil embolized with multiple overlapping 2 mm and 3 mm soft and regular interlock coils. The microcatheter was withdrawn into the central aspect of the left gastric artery and a post embolization arteriogram was performed. The microcatheter was removed and a completion left gastric arteriogram was performed via the Mickelson catheter Again, the Mickelson catheter was utilized to select the celiac artery and selective celiac arteriograms were performed in various obliquities. Images were reviewed and the arteriogram portion of the procedure was terminated. Completion closure arteriogram was performed. All wires, catheters and sheaths were removed from the patient. Hemostasis was achieved at the right groin access site with deployment of a ExoSeal closure device and manual compression. _________________________________________________________ Next, attention was made towards placement of the IVC filter. Under sterile condition and local anesthesia, right common femoral venous access was performed with ultrasound. An ultrasound image was saved and sent to PACS.  Over a guidewire, the IVC filter delivery sheath and inner dilator were advanced into the IVC just above the IVC bifurcation. Contrast injection was performed for an IVC venogram. Through the delivery sheath, a retrievable Denali IVC filter was deployed below the level of the renal veins and above the IVC bifurcation. Limited post deployment venacavagram was performed. The delivery sheath was removed and hemostasis was obtained with manual compression. A dressing was placed. The patient tolerated the procedure well without immediate post procedural complication. FINDINGS: Selective celiac arteriogram demonstrates non conventional branching pattern without supply to the left gastric artery or significant supply to the gastric fundus, as was demonstrated on preceding CTA. As such, the accessory left gastric artery arising directly from the abdominal aorta was selected with sub selective arteriogram demonstrating a tangle of blood vessels contributing arterial supply to the portion of the stomach containing the endoscopy clip. Caudal division of the accessory left gastric artery supplies the dominant arterial contribution to the portion of the stomach containing the endoscopy clip and as such, was percutaneously coil embolized with multiple overlapping 2 and 3 mm interlock coils to near the vessel's origin. Completion arteriogram demonstrates a technically excellent result with  no definitive persistent arterial supply regional to the endoscopy clip. Following the percutaneous coil embolization, several additional selective arteriograms were performed in various obliquities, again confirming no definitive supply to the gastric fundus. As such, additional embolization was not performed. The IVC is patent. No evidence of thrombus, stenosis, or occlusion. No variant venous anatomy. Successful placement of the IVC filter below the level of the renal veins. IMPRESSION: 1. Technically successful percutaneous coil embolization  of the caudal division of the accessory left gastric artery serving as the predominant arterial supply to the portion of the stomach containing the endoscopy clip. 2. Selective celiac arteriogram fails to delineate significant arterial supply to the gastric fundus and in particular, to the portion of stomach regional to the endoscopy clip. 3. Successful ultrasound and fluoroscopically guided placement of an infrarenal retrievable IVC filter. PLAN: - Patient is to lie flat for 4 hours. Continued aggressive resuscitation as per the critical care team. Note, as the patient has experienced an acute large bleeding episode, I would expect the patient's H and H has not yet reached a nadir but hopefully will stabilize in the next 24-48 hours. - The IVC filter is potentially retrievable. The patient will be assessed for filter retrieval by Interventional Radiology in approximately 8-12 weeks. Further recommendations regarding filter retrieval, continued surveillance or declaration of device permanence, will be made at that time. Electronically Signed   By: Sandi Mariscal M.D.   On: 04/12/2017 07:04   Ir Angiogram Follow Up Study  Result Date: 04/12/2017 INDICATION: History of recent upper GI bleed, post endoscopy approximately 1 week ago resulting in placement of an endoscopy clip at the location of a bleeding gastric ulcer. Patient subsequently found to have a lower extremity DVT and was started on anti coagulation (patient has received 2 doses of Xarelto). Unfortunately, this resulted in a recurrent large upper GI bleed. Repeat upper endoscopy demonstrates a large amount of blood products within the stomach with inability to achieve hemostasis from endoscopic approach. As such, request made for emergent mesenteric arteriogram and potential percutaneous coil embolization as well as placement of an IVC filter for temporary caval interruption purposes. EXAM: 1. ULTRASOUND GUIDANCE FOR VENOUS AND ARTERIAL ACCESS 2. SELECTIVE  CELIAC ARTERIOGRAM. 3. SELECTIVE ACCESSORY LEFT GASTRIC ARTERIOGRAM. 4. SELECTIVE ARTERIOGRAM OF DISTAL TRIBUTARY OF THE LEFT GASTRIC ARTERY (3rd ORDER) AND PERCUTANEOUS COIL EMBOLIZATION 5. IVC CATHETERIZATION AND VENOGRAM 6. IVC FILTER INSERTION COMPARISON:  CTA of the abdomen pelvis - 04/03/2017; chest CT - 04/07/2017 MEDICATIONS: None. ANESTHESIA/SEDATION: Versed 58 mg IV Sedation Time: minutes; The patient was continuously monitored during the procedure by the interventional radiology nurse under my direct supervision. CONTRAST:  75 cc Isovue 300 FLUOROSCOPY TIME:  9 Minutes 24 seconds (660 mGy) COMPLICATIONS: None immediate. PROCEDURE: Informed written consent was obtained from the patient's brother following explanation of the procedure, risks, benefits and alternatives. A time out was performed prior to the initiation of the procedure. The right groin was prepped and draped in usual sterile fashion. Maximal barrier sterile technique utilized including caps, mask, sterile gowns, sterile gloves, large sterile drape, hand hygiene, and chlorhexidine prep. The right femoral head was marked fluoroscopically. Under ultrasound guidance, the right common femoral artery was accessed with a micropuncture kit after the overlying soft tissues were anesthetized with 1% lidocaine. An ultrasound image was saved for documentation purposes. The micropuncture sheath was exchanged for a 6 [place] vascular sheath over a Bentson wire. (Note, a 6 [place] vascular sheath was selected for access given suspected hemodynamically  significant stenosis within the mid aspect of the abdominal aorta and potential necessity for angioplasty.) A closure arteriogram was performed through the side of the sheath confirming access within the right common femoral artery, however the 6 [place] vascular sheath was noted to be near occlusive secondary to a combination of atherosclerotic plaque as well as the vasospasm. With the use of a Kumpe catheter, a  Bentson wire was advanced to the level of the caudal aspect of the thoracic aorta. Limited abdominal aortogram was performed demonstrating patency of the abdominal aorta with the 5 French catheter in place and as such abdominal aortic angioplasty would not be necessary and the decision was made to downsized the vascular sheath a 5 [place]. Again, closure arteriogram was performed demonstrating preferential flow through the right internal iliac artery with atretic flow surrounding the vascular sheath. Given patient's critical state the decision was made to proceed with the mesenteric arteriogram and embolization in an expeditious manner in lieu of abandoning the femoral access for a brachial approach. Over a Bentson wire, a Mickelson catheter was advanced to the level of the thoracic aorta where it was back bled and flushed. The catheter was then utilized to select the celiac artery and a selective celiac arteriogram was performed The Mickelson catheter was then utilized to select the left gastric artery and a selective left gastric arteriogram was performed. With the use of an 0.014 fathom microwire, a regular Renegade microcatheter was advanced into a distal tributary of the left gastric artery serving as the predominant arterial supply to the tangle of vessels regional to the endoscopy clip. Selective injection confirmed appropriate positioning. The distal aspect of the branch vessel was then percutaneously coil embolized with multiple overlapping 2 mm and 3 mm soft and regular interlock coils. The microcatheter was withdrawn into the central aspect of the left gastric artery and a post embolization arteriogram was performed. The microcatheter was removed and a completion left gastric arteriogram was performed via the Mickelson catheter Again, the Mickelson catheter was utilized to select the celiac artery and selective celiac arteriograms were performed in various obliquities. Images were reviewed and the  arteriogram portion of the procedure was terminated. Completion closure arteriogram was performed. All wires, catheters and sheaths were removed from the patient. Hemostasis was achieved at the right groin access site with deployment of a ExoSeal closure device and manual compression. _________________________________________________________ Next, attention was made towards placement of the IVC filter. Under sterile condition and local anesthesia, right common femoral venous access was performed with ultrasound. An ultrasound image was saved and sent to PACS. Over a guidewire, the IVC filter delivery sheath and inner dilator were advanced into the IVC just above the IVC bifurcation. Contrast injection was performed for an IVC venogram. Through the delivery sheath, a retrievable Denali IVC filter was deployed below the level of the renal veins and above the IVC bifurcation. Limited post deployment venacavagram was performed. The delivery sheath was removed and hemostasis was obtained with manual compression. A dressing was placed. The patient tolerated the procedure well without immediate post procedural complication. FINDINGS: Selective celiac arteriogram demonstrates non conventional branching pattern without supply to the left gastric artery or significant supply to the gastric fundus, as was demonstrated on preceding CTA. As such, the accessory left gastric artery arising directly from the abdominal aorta was selected with sub selective arteriogram demonstrating a tangle of blood vessels contributing arterial supply to the portion of the stomach containing the endoscopy clip. Caudal division of the accessory left  gastric artery supplies the dominant arterial contribution to the portion of the stomach containing the endoscopy clip and as such, was percutaneously coil embolized with multiple overlapping 2 and 3 mm interlock coils to near the vessel's origin. Completion arteriogram demonstrates a technically  excellent result with no definitive persistent arterial supply regional to the endoscopy clip. Following the percutaneous coil embolization, several additional selective arteriograms were performed in various obliquities, again confirming no definitive supply to the gastric fundus. As such, additional embolization was not performed. The IVC is patent. No evidence of thrombus, stenosis, or occlusion. No variant venous anatomy. Successful placement of the IVC filter below the level of the renal veins. IMPRESSION: 1. Technically successful percutaneous coil embolization of the caudal division of the accessory left gastric artery serving as the predominant arterial supply to the portion of the stomach containing the endoscopy clip. 2. Selective celiac arteriogram fails to delineate significant arterial supply to the gastric fundus and in particular, to the portion of stomach regional to the endoscopy clip. 3. Successful ultrasound and fluoroscopically guided placement of an infrarenal retrievable IVC filter. PLAN: - Patient is to lie flat for 4 hours. Continued aggressive resuscitation as per the critical care team. Note, as the patient has experienced an acute large bleeding episode, I would expect the patient's H and H has not yet reached a nadir but hopefully will stabilize in the next 24-48 hours. - The IVC filter is potentially retrievable. The patient will be assessed for filter retrieval by Interventional Radiology in approximately 8-12 weeks. Further recommendations regarding filter retrieval, continued surveillance or declaration of device permanence, will be made at that time. Electronically Signed   By: Sandi Mariscal M.D.   On: 04/12/2017 07:04   Ir Ivc Filter Plmt / S&i /img Guid/mod Sed  Result Date: 04/12/2017 INDICATION: History of recent upper GI bleed, post endoscopy approximately 1 week ago resulting in placement of an endoscopy clip at the location of a bleeding gastric ulcer. Patient subsequently  found to have a lower extremity DVT and was started on anti coagulation (patient has received 2 doses of Xarelto). Unfortunately, this resulted in a recurrent large upper GI bleed. Repeat upper endoscopy demonstrates a large amount of blood products within the stomach with inability to achieve hemostasis from endoscopic approach. As such, request made for emergent mesenteric arteriogram and potential percutaneous coil embolization as well as placement of an IVC filter for temporary caval interruption purposes. EXAM: 1. ULTRASOUND GUIDANCE FOR VENOUS AND ARTERIAL ACCESS 2. SELECTIVE CELIAC ARTERIOGRAM. 3. SELECTIVE ACCESSORY LEFT GASTRIC ARTERIOGRAM. 4. SELECTIVE ARTERIOGRAM OF DISTAL TRIBUTARY OF THE LEFT GASTRIC ARTERY (3rd ORDER) AND PERCUTANEOUS COIL EMBOLIZATION 5. IVC CATHETERIZATION AND VENOGRAM 6. IVC FILTER INSERTION COMPARISON:  CTA of the abdomen pelvis - 04/03/2017; chest CT - 04/07/2017 MEDICATIONS: None. ANESTHESIA/SEDATION: Versed 58 mg IV Sedation Time: minutes; The patient was continuously monitored during the procedure by the interventional radiology nurse under my direct supervision. CONTRAST:  75 cc Isovue 300 FLUOROSCOPY TIME:  9 Minutes 24 seconds (254 mGy) COMPLICATIONS: None immediate. PROCEDURE: Informed written consent was obtained from the patient's brother following explanation of the procedure, risks, benefits and alternatives. A time out was performed prior to the initiation of the procedure. The right groin was prepped and draped in usual sterile fashion. Maximal barrier sterile technique utilized including caps, mask, sterile gowns, sterile gloves, large sterile drape, hand hygiene, and chlorhexidine prep. The right femoral head was marked fluoroscopically. Under ultrasound guidance, the right common femoral artery was accessed  with a micropuncture kit after the overlying soft tissues were anesthetized with 1% lidocaine. An ultrasound image was saved for documentation purposes. The  micropuncture sheath was exchanged for a 6 [place] vascular sheath over a Bentson wire. (Note, a 6 [place] vascular sheath was selected for access given suspected hemodynamically significant stenosis within the mid aspect of the abdominal aorta and potential necessity for angioplasty.) A closure arteriogram was performed through the side of the sheath confirming access within the right common femoral artery, however the 6 [place] vascular sheath was noted to be near occlusive secondary to a combination of atherosclerotic plaque as well as the vasospasm. With the use of a Kumpe catheter, a Bentson wire was advanced to the level of the caudal aspect of the thoracic aorta. Limited abdominal aortogram was performed demonstrating patency of the abdominal aorta with the 5 French catheter in place and as such abdominal aortic angioplasty would not be necessary and the decision was made to downsized the vascular sheath a 5 [place]. Again, closure arteriogram was performed demonstrating preferential flow through the right internal iliac artery with atretic flow surrounding the vascular sheath. Given patient's critical state the decision was made to proceed with the mesenteric arteriogram and embolization in an expeditious manner in lieu of abandoning the femoral access for a brachial approach. Over a Bentson wire, a Mickelson catheter was advanced to the level of the thoracic aorta where it was back bled and flushed. The catheter was then utilized to select the celiac artery and a selective celiac arteriogram was performed The Mickelson catheter was then utilized to select the left gastric artery and a selective left gastric arteriogram was performed. With the use of an 0.014 fathom microwire, a regular Renegade microcatheter was advanced into a distal tributary of the left gastric artery serving as the predominant arterial supply to the tangle of vessels regional to the endoscopy clip. Selective injection confirmed appropriate  positioning. The distal aspect of the branch vessel was then percutaneously coil embolized with multiple overlapping 2 mm and 3 mm soft and regular interlock coils. The microcatheter was withdrawn into the central aspect of the left gastric artery and a post embolization arteriogram was performed. The microcatheter was removed and a completion left gastric arteriogram was performed via the Mickelson catheter Again, the Mickelson catheter was utilized to select the celiac artery and selective celiac arteriograms were performed in various obliquities. Images were reviewed and the arteriogram portion of the procedure was terminated. Completion closure arteriogram was performed. All wires, catheters and sheaths were removed from the patient. Hemostasis was achieved at the right groin access site with deployment of a ExoSeal closure device and manual compression. _________________________________________________________ Next, attention was made towards placement of the IVC filter. Under sterile condition and local anesthesia, right common femoral venous access was performed with ultrasound. An ultrasound image was saved and sent to PACS. Over a guidewire, the IVC filter delivery sheath and inner dilator were advanced into the IVC just above the IVC bifurcation. Contrast injection was performed for an IVC venogram. Through the delivery sheath, a retrievable Denali IVC filter was deployed below the level of the renal veins and above the IVC bifurcation. Limited post deployment venacavagram was performed. The delivery sheath was removed and hemostasis was obtained with manual compression. A dressing was placed. The patient tolerated the procedure well without immediate post procedural complication. FINDINGS: Selective celiac arteriogram demonstrates non conventional branching pattern without supply to the left gastric artery or significant supply to the gastric fundus,  as was demonstrated on preceding CTA. As such, the  accessory left gastric artery arising directly from the abdominal aorta was selected with sub selective arteriogram demonstrating a tangle of blood vessels contributing arterial supply to the portion of the stomach containing the endoscopy clip. Caudal division of the accessory left gastric artery supplies the dominant arterial contribution to the portion of the stomach containing the endoscopy clip and as such, was percutaneously coil embolized with multiple overlapping 2 and 3 mm interlock coils to near the vessel's origin. Completion arteriogram demonstrates a technically excellent result with no definitive persistent arterial supply regional to the endoscopy clip. Following the percutaneous coil embolization, several additional selective arteriograms were performed in various obliquities, again confirming no definitive supply to the gastric fundus. As such, additional embolization was not performed. The IVC is patent. No evidence of thrombus, stenosis, or occlusion. No variant venous anatomy. Successful placement of the IVC filter below the level of the renal veins. IMPRESSION: 1. Technically successful percutaneous coil embolization of the caudal division of the accessory left gastric artery serving as the predominant arterial supply to the portion of the stomach containing the endoscopy clip. 2. Selective celiac arteriogram fails to delineate significant arterial supply to the gastric fundus and in particular, to the portion of stomach regional to the endoscopy clip. 3. Successful ultrasound and fluoroscopically guided placement of an infrarenal retrievable IVC filter. PLAN: - Patient is to lie flat for 4 hours. Continued aggressive resuscitation as per the critical care team. Note, as the patient has experienced an acute large bleeding episode, I would expect the patient's H and H has not yet reached a nadir but hopefully will stabilize in the next 24-48 hours. - The IVC filter is potentially retrievable. The  patient will be assessed for filter retrieval by Interventional Radiology in approximately 8-12 weeks. Further recommendations regarding filter retrieval, continued surveillance or declaration of device permanence, will be made at that time. Electronically Signed   By: Sandi Mariscal M.D.   On: 04/12/2017 07:04   Ir US Guide Vasc Access Right  Result Date: 04/12/2017 INDICATION: History of recent upper GI bleed, post endoscopy approximately 1 week ago resulting in placement of an endoscopy clip at the location of a bleeding gastric ulcer. Patient subsequently found to have a lower extremity DVT and was started on anti coagulation (patient has received 2 doses of Xarelto). Unfortunately, this resulted in a recurrent large upper GI bleed. Repeat upper endoscopy demonstrates a large amount of blood products within the stomach with inability to achieve hemostasis from endoscopic approach. As such, request made for emergent mesenteric arteriogram and potential percutaneous coil embolization as well as placement of an IVC filter for temporary caval interruption purposes. EXAM: 1. ULTRASOUND GUIDANCE FOR VENOUS AND ARTERIAL ACCESS 2. SELECTIVE CELIAC ARTERIOGRAM. 3. SELECTIVE ACCESSORY LEFT GASTRIC ARTERIOGRAM. 4. SELECTIVE ARTERIOGRAM OF DISTAL TRIBUTARY OF THE LEFT GASTRIC ARTERY (3rd ORDER) AND PERCUTANEOUS COIL EMBOLIZATION 5. IVC CATHETERIZATION AND VENOGRAM 6. IVC FILTER INSERTION COMPARISON:  CTA of the abdomen pelvis - 04/03/2017; chest CT - 04/07/2017 MEDICATIONS: None. ANESTHESIA/SEDATION: Versed 58 mg IV Sedation Time: minutes; The patient was continuously monitored during the procedure by the interventional radiology nurse under my direct supervision. CONTRAST:  75 cc Isovue 300 FLUOROSCOPY TIME:  9 Minutes 24 seconds (888 mGy) COMPLICATIONS: None immediate. PROCEDURE: Informed written consent was obtained from the patient's brother following explanation of the procedure, risks, benefits and alternatives. A  time out was performed prior to the initiation of the procedure.  The right groin was prepped and draped in usual sterile fashion. Maximal barrier sterile technique utilized including caps, mask, sterile gowns, sterile gloves, large sterile drape, hand hygiene, and chlorhexidine prep. The right femoral head was marked fluoroscopically. Under ultrasound guidance, the right common femoral artery was accessed with a micropuncture kit after the overlying soft tissues were anesthetized with 1% lidocaine. An ultrasound image was saved for documentation purposes. The micropuncture sheath was exchanged for a 6 [place] vascular sheath over a Bentson wire. (Note, a 6 [place] vascular sheath was selected for access given suspected hemodynamically significant stenosis within the mid aspect of the abdominal aorta and potential necessity for angioplasty.) A closure arteriogram was performed through the side of the sheath confirming access within the right common femoral artery, however the 6 [place] vascular sheath was noted to be near occlusive secondary to a combination of atherosclerotic plaque as well as the vasospasm. With the use of a Kumpe catheter, a Bentson wire was advanced to the level of the caudal aspect of the thoracic aorta. Limited abdominal aortogram was performed demonstrating patency of the abdominal aorta with the 5 French catheter in place and as such abdominal aortic angioplasty would not be necessary and the decision was made to downsized the vascular sheath a 5 [place]. Again, closure arteriogram was performed demonstrating preferential flow through the right internal iliac artery with atretic flow surrounding the vascular sheath. Given patient's critical state the decision was made to proceed with the mesenteric arteriogram and embolization in an expeditious manner in lieu of abandoning the femoral access for a brachial approach. Over a Bentson wire, a Mickelson catheter was advanced to the level of the  thoracic aorta where it was back bled and flushed. The catheter was then utilized to select the celiac artery and a selective celiac arteriogram was performed The Mickelson catheter was then utilized to select the left gastric artery and a selective left gastric arteriogram was performed. With the use of an 0.014 fathom microwire, a regular Renegade microcatheter was advanced into a distal tributary of the left gastric artery serving as the predominant arterial supply to the tangle of vessels regional to the endoscopy clip. Selective injection confirmed appropriate positioning. The distal aspect of the branch vessel was then percutaneously coil embolized with multiple overlapping 2 mm and 3 mm soft and regular interlock coils. The microcatheter was withdrawn into the central aspect of the left gastric artery and a post embolization arteriogram was performed. The microcatheter was removed and a completion left gastric arteriogram was performed via the Mickelson catheter Again, the Mickelson catheter was utilized to select the celiac artery and selective celiac arteriograms were performed in various obliquities. Images were reviewed and the arteriogram portion of the procedure was terminated. Completion closure arteriogram was performed. All wires, catheters and sheaths were removed from the patient. Hemostasis was achieved at the right groin access site with deployment of a ExoSeal closure device and manual compression. _________________________________________________________ Next, attention was made towards placement of the IVC filter. Under sterile condition and local anesthesia, right common femoral venous access was performed with ultrasound. An ultrasound image was saved and sent to PACS. Over a guidewire, the IVC filter delivery sheath and inner dilator were advanced into the IVC just above the IVC bifurcation. Contrast injection was performed for an IVC venogram. Through the delivery sheath, a retrievable  Denali IVC filter was deployed below the level of the renal veins and above the IVC bifurcation. Limited post deployment venacavagram was performed. The delivery  sheath was removed and hemostasis was obtained with manual compression. A dressing was placed. The patient tolerated the procedure well without immediate post procedural complication. FINDINGS: Selective celiac arteriogram demonstrates non conventional branching pattern without supply to the left gastric artery or significant supply to the gastric fundus, as was demonstrated on preceding CTA. As such, the accessory left gastric artery arising directly from the abdominal aorta was selected with sub selective arteriogram demonstrating a tangle of blood vessels contributing arterial supply to the portion of the stomach containing the endoscopy clip. Caudal division of the accessory left gastric artery supplies the dominant arterial contribution to the portion of the stomach containing the endoscopy clip and as such, was percutaneously coil embolized with multiple overlapping 2 and 3 mm interlock coils to near the vessel's origin. Completion arteriogram demonstrates a technically excellent result with no definitive persistent arterial supply regional to the endoscopy clip. Following the percutaneous coil embolization, several additional selective arteriograms were performed in various obliquities, again confirming no definitive supply to the gastric fundus. As such, additional embolization was not performed. The IVC is patent. No evidence of thrombus, stenosis, or occlusion. No variant venous anatomy. Successful placement of the IVC filter below the level of the renal veins. IMPRESSION: 1. Technically successful percutaneous coil embolization of the caudal division of the accessory left gastric artery serving as the predominant arterial supply to the portion of the stomach containing the endoscopy clip. 2. Selective celiac arteriogram fails to delineate  significant arterial supply to the gastric fundus and in particular, to the portion of stomach regional to the endoscopy clip. 3. Successful ultrasound and fluoroscopically guided placement of an infrarenal retrievable IVC filter. PLAN: - Patient is to lie flat for 4 hours. Continued aggressive resuscitation as per the critical care team. Note, as the patient has experienced an acute large bleeding episode, I would expect the patient's H and H has not yet reached a nadir but hopefully will stabilize in the next 24-48 hours. - The IVC filter is potentially retrievable. The patient will be assessed for filter retrieval by Interventional Radiology in approximately 8-12 weeks. Further recommendations regarding filter retrieval, continued surveillance or declaration of device permanence, will be made at that time. Electronically Signed   By: Sandi Mariscal M.D.   On: 04/12/2017 07:04   Dg Chest Port 1 View  Result Date: 04/12/2017 CLINICAL DATA:  Central line placement EXAM: PORTABLE CHEST 1 VIEW COMPARISON:  04/12/2017 FINDINGS: Endotracheal tube tip measures 4.7 cm above the carina. A left jugular central venous catheter has been placed. Tip is over the mid mediastinum consistent with location in the brachiocephalic vein. No pneumothorax. Mild cardiac enlargement. Developing pulmonary vascular congestion with developing perihilar infiltration suggesting edema. No blunting of costophrenic angles. IMPRESSION: Left central venous catheter tip projects over the brachiocephalic vein. No pneumothorax. Developing cardiac enlargement with perihilar edema. Electronically Signed   By: Lucienne Capers M.D.   On: 04/12/2017 04:05   Dg Chest Port 1 View  Result Date: 04/12/2017 CLINICAL DATA:  Intubation EXAM: PORTABLE CHEST 1 VIEW COMPARISON:  CT chest 04/07/2017.  Chest 04/03/2017 FINDINGS: Shallow inspiration. Heart size and pulmonary vascularity are normal. 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 An endotracheal tube is been placed with tip measuring 4.8 cm above the carina. Lungs are clear. No blunting of costophrenic angles. No pneumothorax. IMPRESSION: Endotracheal tube placed with tip measuring 4.8 cm above the carina. Lungs are clear. Electronically Signed   By: Lucienne Capers  M.D.   On: 04/12/2017 01:22   [place] Guide Roadmapping  Result Date: 04/12/2017 INDICATION: History of recent upper GI bleed, post endoscopy approximately 1 week ago resulting in placement of an endoscopy clip at the location of a bleeding gastric ulcer. Patient subsequently found to have a lower extremity DVT and was started on anti coagulation (patient has received 2 doses of Xarelto). Unfortunately, this resulted in a recurrent large upper GI bleed. Repeat upper endoscopy demonstrates a large amount of blood products within the stomach with inability to achieve hemostasis from endoscopic approach. As such, request made for emergent mesenteric arteriogram and potential percutaneous coil embolization as well as placement of an IVC filter for temporary caval interruption purposes. EXAM: 1. ULTRASOUND GUIDANCE FOR VENOUS AND ARTERIAL ACCESS 2. SELECTIVE CELIAC ARTERIOGRAM. 3. SELECTIVE ACCESSORY LEFT GASTRIC ARTERIOGRAM. 4. SELECTIVE ARTERIOGRAM OF DISTAL TRIBUTARY OF THE LEFT GASTRIC ARTERY (3rd ORDER) AND PERCUTANEOUS COIL EMBOLIZATION 5. IVC CATHETERIZATION AND VENOGRAM 6. IVC FILTER INSERTION COMPARISON:  CTA of the abdomen pelvis - 04/03/2017; chest CT - 04/07/2017 MEDICATIONS: None. ANESTHESIA/SEDATION: Versed 58 mg IV Sedation Time: minutes; The patient was continuously monitored during the procedure by the interventional radiology nurse under my direct supervision. CONTRAST:  75 cc Isovue 300 FLUOROSCOPY TIME:  9 Minutes 24 seconds (308 mGy) COMPLICATIONS: None immediate. PROCEDURE: Informed written consent was obtained from the patient's brother following explanation  of the procedure, risks, benefits and alternatives. A time out was performed prior to the initiation of the procedure. The right groin was prepped and draped in usual sterile fashion. Maximal barrier sterile technique utilized including caps, mask, sterile gowns, sterile gloves, large sterile drape, hand hygiene, and chlorhexidine prep. The right femoral head was marked fluoroscopically. Under ultrasound guidance, the right common femoral artery was accessed with a micropuncture kit after the overlying soft tissues were anesthetized with 1% lidocaine. An ultrasound image was saved for documentation purposes. The micropuncture sheath was exchanged for a 6 [place] vascular sheath over a Bentson wire. (Note, a 6 [place] vascular sheath was selected for access given suspected hemodynamically significant stenosis within the mid aspect of the abdominal aorta and potential necessity for angioplasty.) A closure arteriogram was performed through the side of the sheath confirming access within the right common femoral artery, however the 6 [place] vascular sheath was noted to be near occlusive secondary to a combination of atherosclerotic plaque as well as the vasospasm. With the use of a Kumpe catheter, a Bentson wire was advanced to the level of the caudal aspect of the thoracic aorta. Limited abdominal aortogram was performed demonstrating patency of the abdominal aorta with the 5 French catheter in place and as such abdominal aortic angioplasty would not be necessary and the decision was made to downsized the vascular sheath a 5 [place]. Again, closure arteriogram was performed demonstrating preferential flow through the right internal iliac artery with atretic flow surrounding the vascular sheath. Given patient's critical state the decision was made to proceed with the mesenteric arteriogram and embolization in an expeditious manner in lieu of abandoning the femoral access for a brachial approach. Over a Bentson wire, a  Mickelson catheter was advanced to the level of the thoracic aorta where it was back bled and flushed. The catheter was then utilized to select the celiac artery and a selective celiac arteriogram was performed The Mickelson catheter was then utilized to select the left gastric artery and a selective left gastric arteriogram was performed. With  the use of an 0.014 fathom microwire, a regular Renegade microcatheter was advanced into a distal tributary of the left gastric artery serving as the predominant arterial supply to the tangle of vessels regional to the endoscopy clip. Selective injection confirmed appropriate positioning. The distal aspect of the branch vessel was then percutaneously coil embolized with multiple overlapping 2 mm and 3 mm soft and regular interlock coils. The microcatheter was withdrawn into the central aspect of the left gastric artery and a post embolization arteriogram was performed. The microcatheter was removed and a completion left gastric arteriogram was performed via the Mickelson catheter Again, the Mickelson catheter was utilized to select the celiac artery and selective celiac arteriograms were performed in various obliquities. Images were reviewed and the arteriogram portion of the procedure was terminated. Completion closure arteriogram was performed. All wires, catheters and sheaths were removed from the patient. Hemostasis was achieved at the right groin access site with deployment of a ExoSeal closure device and manual compression. _________________________________________________________ Next, attention was made towards placement of the IVC filter. Under sterile condition and local anesthesia, right common femoral venous access was performed with ultrasound. An ultrasound image was saved and sent to PACS. Over a guidewire, the IVC filter delivery sheath and inner dilator were advanced into the IVC just above the IVC bifurcation. Contrast injection was performed for an IVC  venogram. Through the delivery sheath, a retrievable Denali IVC filter was deployed below the level of the renal veins and above the IVC bifurcation. Limited post deployment venacavagram was performed. The delivery sheath was removed and hemostasis was obtained with manual compression. A dressing was placed. The patient tolerated the procedure well without immediate post procedural complication. FINDINGS: Selective celiac arteriogram demonstrates non conventional branching pattern without supply to the left gastric artery or significant supply to the gastric fundus, as was demonstrated on preceding CTA. As such, the accessory left gastric artery arising directly from the abdominal aorta was selected with sub selective arteriogram demonstrating a tangle of blood vessels contributing arterial supply to the portion of the stomach containing the endoscopy clip. Caudal division of the accessory left gastric artery supplies the dominant arterial contribution to the portion of the stomach containing the endoscopy clip and as such, was percutaneously coil embolized with multiple overlapping 2 and 3 mm interlock coils to near the vessel's origin. Completion arteriogram demonstrates a technically excellent result with no definitive persistent arterial supply regional to the endoscopy clip. Following the percutaneous coil embolization, several additional selective arteriograms were performed in various obliquities, again confirming no definitive supply to the gastric fundus. As such, additional embolization was not performed. The IVC is patent. No evidence of thrombus, stenosis, or occlusion. No variant venous anatomy. Successful placement of the IVC filter below the level of the renal veins. IMPRESSION: 1. Technically successful percutaneous coil embolization of the caudal division of the accessory left gastric artery serving as the predominant arterial supply to the portion of the stomach containing the endoscopy clip. 2.  Selective celiac arteriogram fails to delineate significant arterial supply to the gastric fundus and in particular, to the portion of stomach regional to the endoscopy clip. 3. Successful ultrasound and fluoroscopically guided placement of an infrarenal retrievable IVC filter. PLAN: - Patient is to lie flat for 4 hours. Continued aggressive resuscitation as per the critical care team. Note, as the patient has experienced an acute large bleeding episode, I would expect the patient's H and H has not yet reached a nadir but hopefully will stabilize  in the next 24-48 hours. - The IVC filter is potentially retrievable. The patient will be assessed for filter retrieval by Interventional Radiology in approximately 8-12 weeks. Further recommendations regarding filter retrieval, continued surveillance or declaration of device permanence, will be made at that time. Electronically Signed   By: Sandi Mariscal M.D.   On: 04/12/2017 07:04     STUDIES:  CTA A/P 1/26 > Significant aortic atherosclerosis, with focal high-grade aortic stenosis secondary to mixed calcified and soft plaque, occurring at the level of the inferior mesenteric artery. If there is concern for short distance claudication, correlation with noninvasive lower extremity exam may be useful. Aortic Atherosclerosis (ICD10-I70.0). Atherosclerotic changes at the origin of the mesenteric arteries, however, only the IMA demonstrates evidence of high-grade stenosis, and there appears to be good collateral flow to the IMA distribution secondary to enlarged meandering mesenteric artery/arc of [place]. It is uncertain on the CTA as to what degree atherosclerotic changes/stenosis of the common hepatic artery are contributing to flow compromise. There are 2 right renal arteries and 3 left renal arteries. Atherosclerotic changes involve the origin of all of the renal arteries, with at least 50% stenosis secondary to mixed calcified and soft plaque at the origin of the  largest bilateral renal Arteries. ECHO 1/28 > The cavity size was normal. Wall thickness was normal. Systolic function was normal. The estimated ejection fraction was in the range of 55% to 60%. Wall motion was normal; there were no regional wall motion abnormalities. Features are consistent with a pseudonormal left ventricular filling pattern, with concomitant abnormal relaxation and increased filling pressure (grade 2 diastolic dysfunction). NM 1/29 > No active bleed  CTA Chest 1/29 > 1. 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 Recommend tissue sampling. 2. No pulmonary embolism seen, with mild study limitations detailed Above. 3. Small right pleural effusion.  Mild bibasilar atelectasis. 4. Mild emphysematous changes within the lung apices VAS [place] Lower Venous 1/29 > DVT in Right Gastrocnemius  EGD 1/25 > - Normal esophagus. - A single visible vessel in the setting of a mild ulceration was found in the stomach. Clip (MR unsafe) was placed- Normal examined duodenum.- No specimens collected. Colonoscopy 1/31 >  Diverticulosis in the sigmoid colon.  CULTURES: MRSA 1/24 > Negative  Blood 1/26 > Negative  Urine Culture 1/27 > Negative   ANTIBIOTICS: Zosyn 1/24 Vancomycin 1/24 Rocephin 1/26 >>   SIGNIFICANT EVENTS: 1/24 > Presents to ED   LINES/TUBES: ETT 2/3 >> PIV  [facility] 2/3 >>  DISCUSSION: 62 year old female presents with melena and ABD pain. EGD on 1/25 with single visible vessel in the setting of mild ulceration where clips was placed. Patient continued to have hypotension and bleeding. On 1/31 underwent colonoscopy in which revealed diverticulosis. During stay patient was found to have right LE DVT and started on Xarelto on 2/1. 2/2 patient had hemoptysis and Xarelto was stopped, given 4 units RBCs. 2/3 in the early morning patient was found minimally responsive and hypotensive, requiring intubation. 2 u  PRBC , 1 pk platelets and xarelto reversal agent given .  Transferred to ICU and GI called.     ASSESSMENT / PLAN:  PULMONARY A: Acute Respiratory Failure in setting of severe  GI Bleed /Shock   4.1 cm mass in the aortopulmonary window  P:   Vent Support  VAP Bundle  Trend CXR/ABG  Mass workup when more stable  Check ABG now  CARDIOVASCULAR A:  Hypotension in setting of Hemorraghic Shock  A.Fib RVR -resolved  Diastolic HF (B8OR EF 30-85)  H/O HTN, HLD  P:  Cardiac Monitoring  Cardiology Following  Maintain MAP >65  Titrate pressors for MAP goal  Tr lactate   RENAL A:   Anion Gap Metabolic Acidosis  Hypokalemia -replaced  Hypomagnesium -replaced  Hypernatremia  24 L + I/O balance  P:   Trend BMP Replace electrolytes as indicated  Bicarb gtt  Strict I/O   GASTROINTESTINAL A:   Suspected upper GI bleed  Melena, Hemoptysis  -EGD 1/25 clip placed in setting of mild ulceration found in stomach -Colonoscopy 1/31 with Diverticulosis in the sigmoid colon 2/3 IR intervention with coil embolization of gastric artery   Acute Transaminitis > Improved  Uterine Prolapse   P:   GI Following >  NPO PPI BID  Trend H&H q6H   HEMATOLOGIC A:   Acute Blood Loss Anemia secondary to suspected upper GI bleed  RLE DVT  Thrombocytopenia  P:  Trend CBC  Hold all anticoagulation > Xarelto started on 2/1, received 2 doses, stopped due to hemoptysis  S/P 6 RBC and 2 units FFP, 1 u Platelets  S/p Kcentra 2/3  IVC filter placement 2/3   INFECTIOUS A:   E.Coli 1/2 Blood Culture 1/24 Blood Culture 1/26 Negative  Leukocytosis  P:   Trend WBC and Fever Curve Off  Rocephin 2/3   ENDOCRINE A:   Hyperglycemia    P:   Trend Glucose  SSI   NEUROLOGIC A:   Metabolic Encephalopathy  P:   RASS goal: 0/-1 Monitor  Fentanyl/Versed PRN to achieve RASS   FAMILY  - Updates: Family updated   - Inter-disciplinary family meet or Palliative Care meeting due by: 04/19/2017     Kron Everton NP-C  [facility]  [phone]   04/12/2017

## 2017-04-12 NOTE — Significant Event (Signed)
Upon initial shift change assessment patient alert, oriented and anxious with complaints of right lower extremity discomfort. Patient initially had a bright red bloody stool with clots (399ml) @ 2000 followed by 2 additional bloody bowel movements. Vital signs as per noted in flowsheet documentation. At 2300 patient had an episode of hemataemesis (166ml) with SBP noted 70's; Rapid Response and NP notified.  At 2330 while at patient's bedside, patient's HR increased 140's-150's non sustained; 12 lead EKG indicative of irregular rhythm; upon  assessment patient stated that she could not see and there were overt changes in mental status and rapid deterioration in clinical condition  noted. Primary Care team notified and presented to bedside along with Critical Care team. Patient intubated in room [phone] and transferred to 2M05.     Name of Physician Notified: X. Blount, NP at 2307 and 2325   Mike Craze

## 2017-04-12 NOTE — Sedation Documentation (Signed)
Gauze/tegaderm bandage applied to R femoral puncture sites. Groin level 0, RDP/PT +doppler.

## 2017-04-12 NOTE — Significant Event (Signed)
Rapid Response Event Note  Overview: Time Called: 2300 Arrival Time: 2302 Event Type: Hypotension  Initial Focused Assessment:  Called to bedside for pt with hypotension in setting of known GI bleed. RN states pt had recent hematemesis and bloody stools earlier in the evening.  BP on my arrival 125/77.  Pt was tachypneic and diaphoretic with a HR 150-170.  Initially, the pt was alert and answering questions appropriately.  However, her mental status declined at approximately 2345.  She became lethargic and hypotensive with systolic BP readings ranging from 70-90. CCM was consulted, [facility] NP Blount was called back and responded to bedside. The pt was intubated by Dr. Gilford Raid and transferred to 2M05 for emergent EGD and further care.  Pt's brother at bedside throughout the encounter and was updated on plan of care.   Interventions:  2 U PRBC  2 U FFP EKG, ABG attempted Intubation (20 mg etomidate and 50 mg Rocuronium given) Tx 2M05 GI consulted by primary team with plan for emergent EGD on 2MW Levophed gtt started at time of transfer    Event Summary: Name of Physician Notified: X. Blount, NP at 2310  Name of Consulting Physician Notified: Emmit Alexanders, MD at 2348  Outcome: Transferred (Comment)  Event End Time: 0100  Pam Drown

## 2017-04-12 NOTE — Progress Notes (Signed)
Referring Physician(s): Dr Rande Brunt  Supervising Physician: Sandi Mariscal  Patient Status:  [facility] - In-pt  Chief Complaint:  GI Bleed; DVT Post Mesenteric arteriogram and percutaneous coil embolization of distal tributary of the left gastric artery for acute upper GI bleed. Post infra-renal IVC filter placement   Subjective:  Vent; intubated No response Sedated Hg 9.6 Weaning down pressors  Allergies: Patient has no known allergies.  Medications: Prior to Admission medications   Medication Sig Start Date End Date Taking? Authorizing Provider  aspirin 81 MG tablet Take 81 mg by mouth daily.   Yes [provider]  cholecalciferol (VITAMIN D) 1000 units tablet Take 1,000 Units by mouth 2 (two) times daily.   Yes [provider]  lisinopril-hydrochlorothiazide (PRINZIDE,ZESTORETIC) 20-12.5 MG tablet Take 1 tablet by mouth daily.   Yes [provider]  simvastatin (ZOCOR) 20 MG tablet Take 1 tablet by mouth daily. 01/02/17  Yes [provider]     Vital Signs: BP (!) 106/50   Pulse (!) 58   Temp 97.7 F (36.5 C) (Axillary)   Resp (!) 5   Ht _0  (1.676 m)   Wt 164 lb 10.9 oz (74.7 kg)   SpO2 99%   BMI 26.58 kg/m   Physical Exam  Pulmonary/Chest:  vent  Skin: Skin is warm and dry.  Right groin site dry No bleeding No hematoma Rt foot 1+ pulses  Nursing note and vitals reviewed.   Imaging: Ir Angiogram Visceral Selective  Result Date: 04/12/2017 INDICATION: History of recent upper GI bleed, post endoscopy approximately 1 week ago resulting in placement of an endoscopy clip at the location of a bleeding gastric ulcer. Patient subsequently found to have a lower extremity DVT and was started on anti coagulation (patient has received 2 doses of Xarelto). Unfortunately, this resulted in a recurrent large upper GI bleed. Repeat upper endoscopy demonstrates a large amount of blood products within the stomach with inability to  achieve hemostasis from endoscopic approach. As such, request made for emergent mesenteric arteriogram and potential percutaneous coil embolization as well as placement of an IVC filter for temporary caval interruption purposes. EXAM: 1. ULTRASOUND GUIDANCE FOR VENOUS AND ARTERIAL ACCESS 2. SELECTIVE CELIAC ARTERIOGRAM. 3. SELECTIVE ACCESSORY LEFT GASTRIC ARTERIOGRAM. 4. SELECTIVE ARTERIOGRAM OF DISTAL TRIBUTARY OF THE LEFT GASTRIC ARTERY (3rd ORDER) AND PERCUTANEOUS COIL EMBOLIZATION 5. IVC CATHETERIZATION AND VENOGRAM 6. IVC FILTER INSERTION COMPARISON:  CTA of the abdomen pelvis - 04/03/2017; chest CT - 04/07/2017 MEDICATIONS: None. ANESTHESIA/SEDATION: Versed 58 mg IV Sedation Time: minutes; The patient was continuously monitored during the procedure by the interventional radiology nurse under my direct supervision. CONTRAST:  75 cc Isovue 300 FLUOROSCOPY TIME:  9 Minutes 24 seconds (546 mGy) COMPLICATIONS: None immediate. PROCEDURE: Informed written consent was obtained from the patient's brother following explanation of the procedure, risks, benefits and alternatives. A time out was performed prior to the initiation of the procedure. The right groin was prepped and draped in usual sterile fashion. Maximal barrier sterile technique utilized including caps, mask, sterile gowns, sterile gloves, large sterile drape, hand hygiene, and chlorhexidine prep. The right femoral head was marked fluoroscopically. Under ultrasound guidance, the right common femoral artery was accessed with a micropuncture kit after the overlying soft tissues were anesthetized with 1% lidocaine. An ultrasound image was saved for documentation purposes. The micropuncture sheath was exchanged for a 6 [place] vascular sheath over a Bentson wire. (Note, a 6 [place] vascular sheath was selected for access given suspected  hemodynamically significant stenosis within the mid aspect of the abdominal aorta and potential necessity for angioplasty.) A  closure arteriogram was performed through the side of the sheath confirming access within the right common femoral artery, however the 6 French vascular sheath was noted to be near occlusive secondary to a combination of atherosclerotic plaque as well as the vasospasm. With the use of a Kumpe catheter, a Bentson wire was advanced to the level of the caudal aspect of the thoracic aorta. Limited abdominal aortogram was performed demonstrating patency of the abdominal aorta with the 5 French catheter in place and as such abdominal aortic angioplasty would not be necessary and the decision was made to downsized the vascular sheath a 5 [place]. Again, closure arteriogram was performed demonstrating preferential flow through the right internal iliac artery with atretic flow surrounding the vascular sheath. Given patient's critical state the decision was made to proceed with the mesenteric arteriogram and embolization in an expeditious manner in lieu of abandoning the femoral access for a brachial approach. Over a Bentson wire, a Mickelson catheter was advanced to the level of the thoracic aorta where it was back bled and flushed. The catheter was then utilized to select the celiac artery and a selective celiac arteriogram was performed The Mickelson catheter was then utilized to select the left gastric artery and a selective left gastric arteriogram was performed. With the use of an 0.014 fathom microwire, a regular Renegade microcatheter was advanced into a distal tributary of the left gastric artery serving as the predominant arterial supply to the tangle of vessels regional to the endoscopy clip. Selective injection confirmed appropriate positioning. The distal aspect of the branch vessel was then percutaneously coil embolized with multiple overlapping 2 mm and 3 mm soft and regular interlock coils. The microcatheter was withdrawn into the central aspect of the left gastric artery and a post embolization arteriogram was  performed. The microcatheter was removed and a completion left gastric arteriogram was performed via the Mickelson catheter Again, the Mickelson catheter was utilized to select the celiac artery and selective celiac arteriograms were performed in various obliquities. Images were reviewed and the arteriogram portion of the procedure was terminated. Completion closure arteriogram was performed. All wires, catheters and sheaths were removed from the patient. Hemostasis was achieved at the right groin access site with deployment of a ExoSeal closure device and manual compression. _________________________________________________________ Next, attention was made towards placement of the IVC filter. Under sterile condition and local anesthesia, right common femoral venous access was performed with ultrasound. An ultrasound image was saved and sent to PACS. Over a guidewire, the IVC filter delivery sheath and inner dilator were advanced into the IVC just above the IVC bifurcation. Contrast injection was performed for an IVC venogram. Through the delivery sheath, a retrievable Denali IVC filter was deployed below the level of the renal veins and above the IVC bifurcation. Limited post deployment venacavagram was performed. The delivery sheath was removed and hemostasis was obtained with manual compression. A dressing was placed. The patient tolerated the procedure well without immediate post procedural complication. FINDINGS: Selective celiac arteriogram demonstrates non conventional branching pattern without supply to the left gastric artery or significant supply to the gastric fundus, as was demonstrated on preceding CTA. As such, the accessory left gastric artery arising directly from the abdominal aorta was selected with sub selective arteriogram demonstrating a tangle of blood vessels contributing arterial supply to the portion of the stomach containing the endoscopy clip. Caudal division of the accessory  left gastric  artery supplies the dominant arterial contribution to the portion of the stomach containing the endoscopy clip and as such, was percutaneously coil embolized with multiple overlapping 2 and 3 mm interlock coils to near the vessel's origin. Completion arteriogram demonstrates a technically excellent result with no definitive persistent arterial supply regional to the endoscopy clip. Following the percutaneous coil embolization, several additional selective arteriograms were performed in various obliquities, again confirming no definitive supply to the gastric fundus. As such, additional embolization was not performed. The IVC is patent. No evidence of thrombus, stenosis, or occlusion. No variant venous anatomy. Successful placement of the IVC filter below the level of the renal veins. IMPRESSION: 1. Technically successful percutaneous coil embolization of the caudal division of the accessory left gastric artery serving as the predominant arterial supply to the portion of the stomach containing the endoscopy clip. 2. Selective celiac arteriogram fails to delineate significant arterial supply to the gastric fundus and in particular, to the portion of stomach regional to the endoscopy clip. 3. Successful ultrasound and fluoroscopically guided placement of an infrarenal retrievable IVC filter. PLAN: - Patient is to lie flat for 4 hours. Continued aggressive resuscitation as per the critical care team. Note, as the patient has experienced an acute large bleeding episode, I would expect the patient's H and H has not yet reached a nadir but hopefully will stabilize in the next 24-48 hours. - The IVC filter is potentially retrievable. The patient will be assessed for filter retrieval by Interventional Radiology in approximately 8-12 weeks. Further recommendations regarding filter retrieval, continued surveillance or declaration of device permanence, will be made at that time. Electronically Signed   By: Sandi Mariscal M.D.    On: 04/12/2017 07:04   Ir Angiogram Selective Each Additional Vessel  Result Date: 04/12/2017 INDICATION: History of recent upper GI bleed, post endoscopy approximately 1 week ago resulting in placement of an endoscopy clip at the location of a bleeding gastric ulcer. Patient subsequently found to have a lower extremity DVT and was started on anti coagulation (patient has received 2 doses of Xarelto). Unfortunately, this resulted in a recurrent large upper GI bleed. Repeat upper endoscopy demonstrates a large amount of blood products within the stomach with inability to achieve hemostasis from endoscopic approach. As such, request made for emergent mesenteric arteriogram and potential percutaneous coil embolization as well as placement of an IVC filter for temporary caval interruption purposes. EXAM: 1. ULTRASOUND GUIDANCE FOR VENOUS AND ARTERIAL ACCESS 2. SELECTIVE CELIAC ARTERIOGRAM. 3. SELECTIVE ACCESSORY LEFT GASTRIC ARTERIOGRAM. 4. SELECTIVE ARTERIOGRAM OF DISTAL TRIBUTARY OF THE LEFT GASTRIC ARTERY (3rd ORDER) AND PERCUTANEOUS COIL EMBOLIZATION 5. IVC CATHETERIZATION AND VENOGRAM 6. IVC FILTER INSERTION COMPARISON:  CTA of the abdomen pelvis - 04/03/2017; chest CT - 04/07/2017 MEDICATIONS: None. ANESTHESIA/SEDATION: Versed 58 mg IV Sedation Time: minutes; The patient was continuously monitored during the procedure by the interventional radiology nurse under my direct supervision. CONTRAST:  75 cc Isovue 300 FLUOROSCOPY TIME:  9 Minutes 24 seconds (841 mGy) COMPLICATIONS: None immediate. PROCEDURE: Informed written consent was obtained from the patient's brother following explanation of the procedure, risks, benefits and alternatives. A time out was performed prior to the initiation of the procedure. The right groin was prepped and draped in usual sterile fashion. Maximal barrier sterile technique utilized including caps, mask, sterile gowns, sterile gloves, large sterile drape, hand hygiene, and  chlorhexidine prep. The right femoral head was marked fluoroscopically. Under ultrasound guidance, the right common femoral artery was accessed with  a micropuncture kit after the overlying soft tissues were anesthetized with 1% lidocaine. An ultrasound image was saved for documentation purposes. The micropuncture sheath was exchanged for a 6 [place] vascular sheath over a Bentson wire. (Note, a 6 [place] vascular sheath was selected for access given suspected hemodynamically significant stenosis within the mid aspect of the abdominal aorta and potential necessity for angioplasty.) A closure arteriogram was performed through the side of the sheath confirming access within the right common femoral artery, however the 6 [place] vascular sheath was noted to be near occlusive secondary to a combination of atherosclerotic plaque as well as the vasospasm. With the use of a Kumpe catheter, a Bentson wire was advanced to the level of the caudal aspect of the thoracic aorta. Limited abdominal aortogram was performed demonstrating patency of the abdominal aorta with the 5 French catheter in place and as such abdominal aortic angioplasty would not be necessary and the decision was made to downsized the vascular sheath a 5 [place]. Again, closure arteriogram was performed demonstrating preferential flow through the right internal iliac artery with atretic flow surrounding the vascular sheath. Given patient's critical state the decision was made to proceed with the mesenteric arteriogram and embolization in an expeditious manner in lieu of abandoning the femoral access for a brachial approach. Over a Bentson wire, a Mickelson catheter was advanced to the level of the thoracic aorta where it was back bled and flushed. The catheter was then utilized to select the celiac artery and a selective celiac arteriogram was performed The Mickelson catheter was then utilized to select the left gastric artery and a selective left gastric  arteriogram was performed. With the use of an 0.014 fathom microwire, a regular Renegade microcatheter was advanced into a distal tributary of the left gastric artery serving as the predominant arterial supply to the tangle of vessels regional to the endoscopy clip. Selective injection confirmed appropriate positioning. The distal aspect of the branch vessel was then percutaneously coil embolized with multiple overlapping 2 mm and 3 mm soft and regular interlock coils. The microcatheter was withdrawn into the central aspect of the left gastric artery and a post embolization arteriogram was performed. The microcatheter was removed and a completion left gastric arteriogram was performed via the Mickelson catheter Again, the Mickelson catheter was utilized to select the celiac artery and selective celiac arteriograms were performed in various obliquities. Images were reviewed and the arteriogram portion of the procedure was terminated. Completion closure arteriogram was performed. All wires, catheters and sheaths were removed from the patient. Hemostasis was achieved at the right groin access site with deployment of a ExoSeal closure device and manual compression. _________________________________________________________ Next, attention was made towards placement of the IVC filter. Under sterile condition and local anesthesia, right common femoral venous access was performed with ultrasound. An ultrasound image was saved and sent to PACS. Over a guidewire, the IVC filter delivery sheath and inner dilator were advanced into the IVC just above the IVC bifurcation. Contrast injection was performed for an IVC venogram. Through the delivery sheath, a retrievable Denali IVC filter was deployed below the level of the renal veins and above the IVC bifurcation. Limited post deployment venacavagram was performed. The delivery sheath was removed and hemostasis was obtained with manual compression. A dressing was placed. The  patient tolerated the procedure well without immediate post procedural complication. FINDINGS: Selective celiac arteriogram demonstrates non conventional branching pattern without supply to the left gastric artery or significant supply to the gastric fundus, as  was demonstrated on preceding CTA. As such, the accessory left gastric artery arising directly from the abdominal aorta was selected with sub selective arteriogram demonstrating a tangle of blood vessels contributing arterial supply to the portion of the stomach containing the endoscopy clip. Caudal division of the accessory left gastric artery supplies the dominant arterial contribution to the portion of the stomach containing the endoscopy clip and as such, was percutaneously coil embolized with multiple overlapping 2 and 3 mm interlock coils to near the vessel's origin. Completion arteriogram demonstrates a technically excellent result with no definitive persistent arterial supply regional to the endoscopy clip. Following the percutaneous coil embolization, several additional selective arteriograms were performed in various obliquities, again confirming no definitive supply to the gastric fundus. As such, additional embolization was not performed. The IVC is patent. No evidence of thrombus, stenosis, or occlusion. No variant venous anatomy. Successful placement of the IVC filter below the level of the renal veins. IMPRESSION: 1. Technically successful percutaneous coil embolization of the caudal division of the accessory left gastric artery serving as the predominant arterial supply to the portion of the stomach containing the endoscopy clip. 2. Selective celiac arteriogram fails to delineate significant arterial supply to the gastric fundus and in particular, to the portion of stomach regional to the endoscopy clip. 3. Successful ultrasound and fluoroscopically guided placement of an infrarenal retrievable IVC filter. PLAN: - Patient is to lie flat for 4  hours. Continued aggressive resuscitation as per the critical care team. Note, as the patient has experienced an acute large bleeding episode, I would expect the patient's H and H has not yet reached a nadir but hopefully will stabilize in the next 24-48 hours. - The IVC filter is potentially retrievable. The patient will be assessed for filter retrieval by Interventional Radiology in approximately 8-12 weeks. Further recommendations regarding filter retrieval, continued surveillance or declaration of device permanence, will be made at that time. Electronically Signed   By: Sandi Mariscal M.D.   On: 04/12/2017 07:04   Ir Angiogram Follow Up Study  Result Date: 04/12/2017 INDICATION: History of recent upper GI bleed, post endoscopy approximately 1 week ago resulting in placement of an endoscopy clip at the location of a bleeding gastric ulcer. Patient subsequently found to have a lower extremity DVT and was started on anti coagulation (patient has received 2 doses of Xarelto). Unfortunately, this resulted in a recurrent large upper GI bleed. Repeat upper endoscopy demonstrates a large amount of blood products within the stomach with inability to achieve hemostasis from endoscopic approach. As such, request made for emergent mesenteric arteriogram and potential percutaneous coil embolization as well as placement of an IVC filter for temporary caval interruption purposes. EXAM: 1. ULTRASOUND GUIDANCE FOR VENOUS AND ARTERIAL ACCESS 2. SELECTIVE CELIAC ARTERIOGRAM. 3. SELECTIVE ACCESSORY LEFT GASTRIC ARTERIOGRAM. 4. SELECTIVE ARTERIOGRAM OF DISTAL TRIBUTARY OF THE LEFT GASTRIC ARTERY (3rd ORDER) AND PERCUTANEOUS COIL EMBOLIZATION 5. IVC CATHETERIZATION AND VENOGRAM 6. IVC FILTER INSERTION COMPARISON:  CTA of the abdomen pelvis - 04/03/2017; chest CT - 04/07/2017 MEDICATIONS: None. ANESTHESIA/SEDATION: Versed 58 mg IV Sedation Time: minutes; The patient was continuously monitored during the procedure by the interventional  radiology nurse under my direct supervision. CONTRAST:  75 cc Isovue 300 FLUOROSCOPY TIME:  9 Minutes 24 seconds (151 mGy) COMPLICATIONS: None immediate. PROCEDURE: Informed written consent was obtained from the patient's brother following explanation of the procedure, risks, benefits and alternatives. A time out was performed prior to the initiation of the procedure. The right groin  was prepped and draped in usual sterile fashion. Maximal barrier sterile technique utilized including caps, mask, sterile gowns, sterile gloves, large sterile drape, hand hygiene, and chlorhexidine prep. The right femoral head was marked fluoroscopically. Under ultrasound guidance, the right common femoral artery was accessed with a micropuncture kit after the overlying soft tissues were anesthetized with 1% lidocaine. An ultrasound image was saved for documentation purposes. The micropuncture sheath was exchanged for a 6 [place] vascular sheath over a Bentson wire. (Note, a 6 [place] vascular sheath was selected for access given suspected hemodynamically significant stenosis within the mid aspect of the abdominal aorta and potential necessity for angioplasty.) A closure arteriogram was performed through the side of the sheath confirming access within the right common femoral artery, however the 6 [place] vascular sheath was noted to be near occlusive secondary to a combination of atherosclerotic plaque as well as the vasospasm. With the use of a Kumpe catheter, a Bentson wire was advanced to the level of the caudal aspect of the thoracic aorta. Limited abdominal aortogram was performed demonstrating patency of the abdominal aorta with the 5 French catheter in place and as such abdominal aortic angioplasty would not be necessary and the decision was made to downsized the vascular sheath a 5 [place]. Again, closure arteriogram was performed demonstrating preferential flow through the right internal iliac artery with atretic flow surrounding  the vascular sheath. Given patient's critical state the decision was made to proceed with the mesenteric arteriogram and embolization in an expeditious manner in lieu of abandoning the femoral access for a brachial approach. Over a Bentson wire, a Mickelson catheter was advanced to the level of the thoracic aorta where it was back bled and flushed. The catheter was then utilized to select the celiac artery and a selective celiac arteriogram was performed The Mickelson catheter was then utilized to select the left gastric artery and a selective left gastric arteriogram was performed. With the use of an 0.014 fathom microwire, a regular Renegade microcatheter was advanced into a distal tributary of the left gastric artery serving as the predominant arterial supply to the tangle of vessels regional to the endoscopy clip. Selective injection confirmed appropriate positioning. The distal aspect of the branch vessel was then percutaneously coil embolized with multiple overlapping 2 mm and 3 mm soft and regular interlock coils. The microcatheter was withdrawn into the central aspect of the left gastric artery and a post embolization arteriogram was performed. The microcatheter was removed and a completion left gastric arteriogram was performed via the Mickelson catheter Again, the Mickelson catheter was utilized to select the celiac artery and selective celiac arteriograms were performed in various obliquities. Images were reviewed and the arteriogram portion of the procedure was terminated. Completion closure arteriogram was performed. All wires, catheters and sheaths were removed from the patient. Hemostasis was achieved at the right groin access site with deployment of a ExoSeal closure device and manual compression. _________________________________________________________ Next, attention was made towards placement of the IVC filter. Under sterile condition and local anesthesia, right common femoral venous access was  performed with ultrasound. An ultrasound image was saved and sent to PACS. Over a guidewire, the IVC filter delivery sheath and inner dilator were advanced into the IVC just above the IVC bifurcation. Contrast injection was performed for an IVC venogram. Through the delivery sheath, a retrievable Denali IVC filter was deployed below the level of the renal veins and above the IVC bifurcation. Limited post deployment venacavagram was performed. The delivery sheath was removed  and hemostasis was obtained with manual compression. A dressing was placed. The patient tolerated the procedure well without immediate post procedural complication. FINDINGS: Selective celiac arteriogram demonstrates non conventional branching pattern without supply to the left gastric artery or significant supply to the gastric fundus, as was demonstrated on preceding CTA. As such, the accessory left gastric artery arising directly from the abdominal aorta was selected with sub selective arteriogram demonstrating a tangle of blood vessels contributing arterial supply to the portion of the stomach containing the endoscopy clip. Caudal division of the accessory left gastric artery supplies the dominant arterial contribution to the portion of the stomach containing the endoscopy clip and as such, was percutaneously coil embolized with multiple overlapping 2 and 3 mm interlock coils to near the vessel's origin. Completion arteriogram demonstrates a technically excellent result with no definitive persistent arterial supply regional to the endoscopy clip. Following the percutaneous coil embolization, several additional selective arteriograms were performed in various obliquities, again confirming no definitive supply to the gastric fundus. As such, additional embolization was not performed. The IVC is patent. No evidence of thrombus, stenosis, or occlusion. No variant venous anatomy. Successful placement of the IVC filter below the level of the renal  veins. IMPRESSION: 1. Technically successful percutaneous coil embolization of the caudal division of the accessory left gastric artery serving as the predominant arterial supply to the portion of the stomach containing the endoscopy clip. 2. Selective celiac arteriogram fails to delineate significant arterial supply to the gastric fundus and in particular, to the portion of stomach regional to the endoscopy clip. 3. Successful ultrasound and fluoroscopically guided placement of an infrarenal retrievable IVC filter. PLAN: - Patient is to lie flat for 4 hours. Continued aggressive resuscitation as per the critical care team. Note, as the patient has experienced an acute large bleeding episode, I would expect the patient's H and H has not yet reached a nadir but hopefully will stabilize in the next 24-48 hours. - The IVC filter is potentially retrievable. The patient will be assessed for filter retrieval by Interventional Radiology in approximately 8-12 weeks. Further recommendations regarding filter retrieval, continued surveillance or declaration of device permanence, will be made at that time. Electronically Signed   By: Sandi Mariscal M.D.   On: 04/12/2017 07:04   Ir Ivc Filter Plmt / S&i /img Guid/mod Sed  Result Date: 04/12/2017 INDICATION: History of recent upper GI bleed, post endoscopy approximately 1 week ago resulting in placement of an endoscopy clip at the location of a bleeding gastric ulcer. Patient subsequently found to have a lower extremity DVT and was started on anti coagulation (patient has received 2 doses of Xarelto). Unfortunately, this resulted in a recurrent large upper GI bleed. Repeat upper endoscopy demonstrates a large amount of blood products within the stomach with inability to achieve hemostasis from endoscopic approach. As such, request made for emergent mesenteric arteriogram and potential percutaneous coil embolization as well as placement of an IVC filter for temporary caval  interruption purposes. EXAM: 1. ULTRASOUND GUIDANCE FOR VENOUS AND ARTERIAL ACCESS 2. SELECTIVE CELIAC ARTERIOGRAM. 3. SELECTIVE ACCESSORY LEFT GASTRIC ARTERIOGRAM. 4. SELECTIVE ARTERIOGRAM OF DISTAL TRIBUTARY OF THE LEFT GASTRIC ARTERY (3rd ORDER) AND PERCUTANEOUS COIL EMBOLIZATION 5. IVC CATHETERIZATION AND VENOGRAM 6. IVC FILTER INSERTION COMPARISON:  CTA of the abdomen pelvis - 04/03/2017; chest CT - 04/07/2017 MEDICATIONS: None. ANESTHESIA/SEDATION: Versed 58 mg IV Sedation Time: minutes; The patient was continuously monitored during the procedure by the interventional radiology nurse under my direct supervision. CONTRAST:  75 cc Isovue 300 FLUOROSCOPY TIME:  9 Minutes 24 seconds (564 mGy) COMPLICATIONS: None immediate. PROCEDURE: Informed written consent was obtained from the patient's brother following explanation of the procedure, risks, benefits and alternatives. A time out was performed prior to the initiation of the procedure. The right groin was prepped and draped in usual sterile fashion. Maximal barrier sterile technique utilized including caps, mask, sterile gowns, sterile gloves, large sterile drape, hand hygiene, and chlorhexidine prep. The right femoral head was marked fluoroscopically. Under ultrasound guidance, the right common femoral artery was accessed with a micropuncture kit after the overlying soft tissues were anesthetized with 1% lidocaine. An ultrasound image was saved for documentation purposes. The micropuncture sheath was exchanged for a 6 [place] vascular sheath over a Bentson wire. (Note, a 6 [place] vascular sheath was selected for access given suspected hemodynamically significant stenosis within the mid aspect of the abdominal aorta and potential necessity for angioplasty.) A closure arteriogram was performed through the side of the sheath confirming access within the right common femoral artery, however the 6 [place] vascular sheath was noted to be near occlusive secondary to a  combination of atherosclerotic plaque as well as the vasospasm. With the use of a Kumpe catheter, a Bentson wire was advanced to the level of the caudal aspect of the thoracic aorta. Limited abdominal aortogram was performed demonstrating patency of the abdominal aorta with the 5 French catheter in place and as such abdominal aortic angioplasty would not be necessary and the decision was made to downsized the vascular sheath a 5 [place]. Again, closure arteriogram was performed demonstrating preferential flow through the right internal iliac artery with atretic flow surrounding the vascular sheath. Given patient's critical state the decision was made to proceed with the mesenteric arteriogram and embolization in an expeditious manner in lieu of abandoning the femoral access for a brachial approach. Over a Bentson wire, a Mickelson catheter was advanced to the level of the thoracic aorta where it was back bled and flushed. The catheter was then utilized to select the celiac artery and a selective celiac arteriogram was performed The Mickelson catheter was then utilized to select the left gastric artery and a selective left gastric arteriogram was performed. With the use of an 0.014 fathom microwire, a regular Renegade microcatheter was advanced into a distal tributary of the left gastric artery serving as the predominant arterial supply to the tangle of vessels regional to the endoscopy clip. Selective injection confirmed appropriate positioning. The distal aspect of the branch vessel was then percutaneously coil embolized with multiple overlapping 2 mm and 3 mm soft and regular interlock coils. The microcatheter was withdrawn into the central aspect of the left gastric artery and a post embolization arteriogram was performed. The microcatheter was removed and a completion left gastric arteriogram was performed via the Mickelson catheter Again, the Mickelson catheter was utilized to select the celiac artery and  selective celiac arteriograms were performed in various obliquities. Images were reviewed and the arteriogram portion of the procedure was terminated. Completion closure arteriogram was performed. All wires, catheters and sheaths were removed from the patient. Hemostasis was achieved at the right groin access site with deployment of a ExoSeal closure device and manual compression. _________________________________________________________ Next, attention was made towards placement of the IVC filter. Under sterile condition and local anesthesia, right common femoral venous access was performed with ultrasound. An ultrasound image was saved and sent to PACS. Over a guidewire, the IVC filter delivery sheath and inner dilator were advanced  into the IVC just above the IVC bifurcation. Contrast injection was performed for an IVC venogram. Through the delivery sheath, a retrievable Denali IVC filter was deployed below the level of the renal veins and above the IVC bifurcation. Limited post deployment venacavagram was performed. The delivery sheath was removed and hemostasis was obtained with manual compression. A dressing was placed. The patient tolerated the procedure well without immediate post procedural complication. FINDINGS: Selective celiac arteriogram demonstrates non conventional branching pattern without supply to the left gastric artery or significant supply to the gastric fundus, as was demonstrated on preceding CTA. As such, the accessory left gastric artery arising directly from the abdominal aorta was selected with sub selective arteriogram demonstrating a tangle of blood vessels contributing arterial supply to the portion of the stomach containing the endoscopy clip. Caudal division of the accessory left gastric artery supplies the dominant arterial contribution to the portion of the stomach containing the endoscopy clip and as such, was percutaneously coil embolized with multiple overlapping 2 and 3 mm  interlock coils to near the vessel's origin. Completion arteriogram demonstrates a technically excellent result with no definitive persistent arterial supply regional to the endoscopy clip. Following the percutaneous coil embolization, several additional selective arteriograms were performed in various obliquities, again confirming no definitive supply to the gastric fundus. As such, additional embolization was not performed. The IVC is patent. No evidence of thrombus, stenosis, or occlusion. No variant venous anatomy. Successful placement of the IVC filter below the level of the renal veins. IMPRESSION: 1. Technically successful percutaneous coil embolization of the caudal division of the accessory left gastric artery serving as the predominant arterial supply to the portion of the stomach containing the endoscopy clip. 2. Selective celiac arteriogram fails to delineate significant arterial supply to the gastric fundus and in particular, to the portion of stomach regional to the endoscopy clip. 3. Successful ultrasound and fluoroscopically guided placement of an infrarenal retrievable IVC filter. PLAN: - Patient is to lie flat for 4 hours. Continued aggressive resuscitation as per the critical care team. Note, as the patient has experienced an acute large bleeding episode, I would expect the patient's H and H has not yet reached a nadir but hopefully will stabilize in the next 24-48 hours. - The IVC filter is potentially retrievable. The patient will be assessed for filter retrieval by Interventional Radiology in approximately 8-12 weeks. Further recommendations regarding filter retrieval, continued surveillance or declaration of device permanence, will be made at that time. Electronically Signed   By: Sandi Mariscal M.D.   On: 04/12/2017 07:04   Ir US Guide Vasc Access Right  Result Date: 04/12/2017 INDICATION: History of recent upper GI bleed, post endoscopy approximately 1 week ago resulting in placement of an  endoscopy clip at the location of a bleeding gastric ulcer. Patient subsequently found to have a lower extremity DVT and was started on anti coagulation (patient has received 2 doses of Xarelto). Unfortunately, this resulted in a recurrent large upper GI bleed. Repeat upper endoscopy demonstrates a large amount of blood products within the stomach with inability to achieve hemostasis from endoscopic approach. As such, request made for emergent mesenteric arteriogram and potential percutaneous coil embolization as well as placement of an IVC filter for temporary caval interruption purposes. EXAM: 1. ULTRASOUND GUIDANCE FOR VENOUS AND ARTERIAL ACCESS 2. SELECTIVE CELIAC ARTERIOGRAM. 3. SELECTIVE ACCESSORY LEFT GASTRIC ARTERIOGRAM. 4. SELECTIVE ARTERIOGRAM OF DISTAL TRIBUTARY OF THE LEFT GASTRIC ARTERY (3rd ORDER) AND PERCUTANEOUS COIL EMBOLIZATION 5. IVC CATHETERIZATION AND  VENOGRAM 6. IVC FILTER INSERTION COMPARISON:  CTA of the abdomen pelvis - 04/03/2017; chest CT - 04/07/2017 MEDICATIONS: None. ANESTHESIA/SEDATION: Versed 58 mg IV Sedation Time: minutes; The patient was continuously monitored during the procedure by the interventional radiology nurse under my direct supervision. CONTRAST:  75 cc Isovue 300 FLUOROSCOPY TIME:  9 Minutes 24 seconds (211 mGy) COMPLICATIONS: None immediate. PROCEDURE: Informed written consent was obtained from the patient's brother following explanation of the procedure, risks, benefits and alternatives. A time out was performed prior to the initiation of the procedure. The right groin was prepped and draped in usual sterile fashion. Maximal barrier sterile technique utilized including caps, mask, sterile gowns, sterile gloves, large sterile drape, hand hygiene, and chlorhexidine prep. The right femoral head was marked fluoroscopically. Under ultrasound guidance, the right common femoral artery was accessed with a micropuncture kit after the overlying soft tissues were anesthetized  with 1% lidocaine. An ultrasound image was saved for documentation purposes. The micropuncture sheath was exchanged for a 6 [place] vascular sheath over a Bentson wire. (Note, a 6 [place] vascular sheath was selected for access given suspected hemodynamically significant stenosis within the mid aspect of the abdominal aorta and potential necessity for angioplasty.) A closure arteriogram was performed through the side of the sheath confirming access within the right common femoral artery, however the 6 [place] vascular sheath was noted to be near occlusive secondary to a combination of atherosclerotic plaque as well as the vasospasm. With the use of a Kumpe catheter, a Bentson wire was advanced to the level of the caudal aspect of the thoracic aorta. Limited abdominal aortogram was performed demonstrating patency of the abdominal aorta with the 5 French catheter in place and as such abdominal aortic angioplasty would not be necessary and the decision was made to downsized the vascular sheath a 5 [place]. Again, closure arteriogram was performed demonstrating preferential flow through the right internal iliac artery with atretic flow surrounding the vascular sheath. Given patient's critical state the decision was made to proceed with the mesenteric arteriogram and embolization in an expeditious manner in lieu of abandoning the femoral access for a brachial approach. Over a Bentson wire, a Mickelson catheter was advanced to the level of the thoracic aorta where it was back bled and flushed. The catheter was then utilized to select the celiac artery and a selective celiac arteriogram was performed The Mickelson catheter was then utilized to select the left gastric artery and a selective left gastric arteriogram was performed. With the use of an 0.014 fathom microwire, a regular Renegade microcatheter was advanced into a distal tributary of the left gastric artery serving as the predominant arterial supply to the tangle of  vessels regional to the endoscopy clip. Selective injection confirmed appropriate positioning. The distal aspect of the branch vessel was then percutaneously coil embolized with multiple overlapping 2 mm and 3 mm soft and regular interlock coils. The microcatheter was withdrawn into the central aspect of the left gastric artery and a post embolization arteriogram was performed. The microcatheter was removed and a completion left gastric arteriogram was performed via the Mickelson catheter Again, the Mickelson catheter was utilized to select the celiac artery and selective celiac arteriograms were performed in various obliquities. Images were reviewed and the arteriogram portion of the procedure was terminated. Completion closure arteriogram was performed. All wires, catheters and sheaths were removed from the patient. Hemostasis was achieved at the right groin access site with deployment of a ExoSeal closure device and manual compression. _________________________________________________________  Next, attention was made towards placement of the IVC filter. Under sterile condition and local anesthesia, right common femoral venous access was performed with ultrasound. An ultrasound image was saved and sent to PACS. Over a guidewire, the IVC filter delivery sheath and inner dilator were advanced into the IVC just above the IVC bifurcation. Contrast injection was performed for an IVC venogram. Through the delivery sheath, a retrievable Denali IVC filter was deployed below the level of the renal veins and above the IVC bifurcation. Limited post deployment venacavagram was performed. The delivery sheath was removed and hemostasis was obtained with manual compression. A dressing was placed. The patient tolerated the procedure well without immediate post procedural complication. FINDINGS: Selective celiac arteriogram demonstrates non conventional branching pattern without supply to the left gastric artery or significant  supply to the gastric fundus, as was demonstrated on preceding CTA. As such, the accessory left gastric artery arising directly from the abdominal aorta was selected with sub selective arteriogram demonstrating a tangle of blood vessels contributing arterial supply to the portion of the stomach containing the endoscopy clip. Caudal division of the accessory left gastric artery supplies the dominant arterial contribution to the portion of the stomach containing the endoscopy clip and as such, was percutaneously coil embolized with multiple overlapping 2 and 3 mm interlock coils to near the vessel's origin. Completion arteriogram demonstrates a technically excellent result with no definitive persistent arterial supply regional to the endoscopy clip. Following the percutaneous coil embolization, several additional selective arteriograms were performed in various obliquities, again confirming no definitive supply to the gastric fundus. As such, additional embolization was not performed. The IVC is patent. No evidence of thrombus, stenosis, or occlusion. No variant venous anatomy. Successful placement of the IVC filter below the level of the renal veins. IMPRESSION: 1. Technically successful percutaneous coil embolization of the caudal division of the accessory left gastric artery serving as the predominant arterial supply to the portion of the stomach containing the endoscopy clip. 2. Selective celiac arteriogram fails to delineate significant arterial supply to the gastric fundus and in particular, to the portion of stomach regional to the endoscopy clip. 3. Successful ultrasound and fluoroscopically guided placement of an infrarenal retrievable IVC filter. PLAN: - Patient is to lie flat for 4 hours. Continued aggressive resuscitation as per the critical care team. Note, as the patient has experienced an acute large bleeding episode, I would expect the patient's H and H has not yet reached a nadir but hopefully will  stabilize in the next 24-48 hours. - The IVC filter is potentially retrievable. The patient will be assessed for filter retrieval by Interventional Radiology in approximately 8-12 weeks. Further recommendations regarding filter retrieval, continued surveillance or declaration of device permanence, will be made at that time. Electronically Signed   By: Sandi Mariscal M.D.   On: 04/12/2017 07:04   Dg Chest Port 1 View  Result Date: 04/12/2017 CLINICAL DATA:  Central line placement EXAM: PORTABLE CHEST 1 VIEW COMPARISON:  04/12/2017 FINDINGS: Endotracheal tube tip measures 4.7 cm above the carina. A left jugular central venous catheter has been placed. Tip is over the mid mediastinum consistent with location in the brachiocephalic vein. No pneumothorax. Mild cardiac enlargement. Developing pulmonary vascular congestion with developing perihilar infiltration suggesting edema. No blunting of costophrenic angles. IMPRESSION: Left central venous catheter tip projects over the brachiocephalic vein. No pneumothorax. Developing cardiac enlargement with perihilar edema. Electronically Signed   By: Lucienne Capers M.D.   On: 04/12/2017 04:05  Dg Chest Port 1 View  Result Date: 04/12/2017 CLINICAL DATA:  Intubation EXAM: PORTABLE CHEST 1 VIEW COMPARISON:  CT chest 04/07/2017.  Chest 04/03/2017 FINDINGS: Shallow inspiration. Heart size and pulmonary vascularity are normal. 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 An endotracheal tube is been placed with tip measuring 4.8 cm above the carina. Lungs are clear. No blunting of costophrenic angles. No pneumothorax. IMPRESSION: Endotracheal tube placed with tip measuring 4.8 cm above the carina. Lungs are clear. Electronically Signed   By: Lucienne Capers M.D.   On: 04/12/2017 01:22   [place] Guide Roadmapping  Result Date: 04/12/2017 INDICATION: History of recent upper GI bleed, post endoscopy  approximately 1 week ago resulting in placement of an endoscopy clip at the location of a bleeding gastric ulcer. Patient subsequently found to have a lower extremity DVT and was started on anti coagulation (patient has received 2 doses of Xarelto). Unfortunately, this resulted in a recurrent large upper GI bleed. Repeat upper endoscopy demonstrates a large amount of blood products within the stomach with inability to achieve hemostasis from endoscopic approach. As such, request made for emergent mesenteric arteriogram and potential percutaneous coil embolization as well as placement of an IVC filter for temporary caval interruption purposes. EXAM: 1. ULTRASOUND GUIDANCE FOR VENOUS AND ARTERIAL ACCESS 2. SELECTIVE CELIAC ARTERIOGRAM. 3. SELECTIVE ACCESSORY LEFT GASTRIC ARTERIOGRAM. 4. SELECTIVE ARTERIOGRAM OF DISTAL TRIBUTARY OF THE LEFT GASTRIC ARTERY (3rd ORDER) AND PERCUTANEOUS COIL EMBOLIZATION 5. IVC CATHETERIZATION AND VENOGRAM 6. IVC FILTER INSERTION COMPARISON:  CTA of the abdomen pelvis - 04/03/2017; chest CT - 04/07/2017 MEDICATIONS: None. ANESTHESIA/SEDATION: Versed 58 mg IV Sedation Time: minutes; The patient was continuously monitored during the procedure by the interventional radiology nurse under my direct supervision. CONTRAST:  75 cc Isovue 300 FLUOROSCOPY TIME:  9 Minutes 24 seconds (573 mGy) COMPLICATIONS: None immediate. PROCEDURE: Informed written consent was obtained from the patient's brother following explanation of the procedure, risks, benefits and alternatives. A time out was performed prior to the initiation of the procedure. The right groin was prepped and draped in usual sterile fashion. Maximal barrier sterile technique utilized including caps, mask, sterile gowns, sterile gloves, large sterile drape, hand hygiene, and chlorhexidine prep. The right femoral head was marked fluoroscopically. Under ultrasound guidance, the right common femoral artery was accessed with a micropuncture kit  after the overlying soft tissues were anesthetized with 1% lidocaine. An ultrasound image was saved for documentation purposes. The micropuncture sheath was exchanged for a 6 [place] vascular sheath over a Bentson wire. (Note, a 6 [place] vascular sheath was selected for access given suspected hemodynamically significant stenosis within the mid aspect of the abdominal aorta and potential necessity for angioplasty.) A closure arteriogram was performed through the side of the sheath confirming access within the right common femoral artery, however the 6 [place] vascular sheath was noted to be near occlusive secondary to a combination of atherosclerotic plaque as well as the vasospasm. With the use of a Kumpe catheter, a Bentson wire was advanced to the level of the caudal aspect of the thoracic aorta. Limited abdominal aortogram was performed demonstrating patency of the abdominal aorta with the 5 French catheter in place and as such abdominal aortic angioplasty would not be necessary and the decision was made to downsized the vascular sheath a 5 [place]. Again, closure arteriogram was performed demonstrating preferential flow through the right internal iliac artery with atretic flow surrounding the  vascular sheath. Given patient's critical state the decision was made to proceed with the mesenteric arteriogram and embolization in an expeditious manner in lieu of abandoning the femoral access for a brachial approach. Over a Bentson wire, a Mickelson catheter was advanced to the level of the thoracic aorta where it was back bled and flushed. The catheter was then utilized to select the celiac artery and a selective celiac arteriogram was performed The Mickelson catheter was then utilized to select the left gastric artery and a selective left gastric arteriogram was performed. With the use of an 0.014 fathom microwire, a regular Renegade microcatheter was advanced into a distal tributary of the left gastric artery serving as  the predominant arterial supply to the tangle of vessels regional to the endoscopy clip. Selective injection confirmed appropriate positioning. The distal aspect of the branch vessel was then percutaneously coil embolized with multiple overlapping 2 mm and 3 mm soft and regular interlock coils. The microcatheter was withdrawn into the central aspect of the left gastric artery and a post embolization arteriogram was performed. The microcatheter was removed and a completion left gastric arteriogram was performed via the Mickelson catheter Again, the Mickelson catheter was utilized to select the celiac artery and selective celiac arteriograms were performed in various obliquities. Images were reviewed and the arteriogram portion of the procedure was terminated. Completion closure arteriogram was performed. All wires, catheters and sheaths were removed from the patient. Hemostasis was achieved at the right groin access site with deployment of a ExoSeal closure device and manual compression. _________________________________________________________ Next, attention was made towards placement of the IVC filter. Under sterile condition and local anesthesia, right common femoral venous access was performed with ultrasound. An ultrasound image was saved and sent to PACS. Over a guidewire, the IVC filter delivery sheath and inner dilator were advanced into the IVC just above the IVC bifurcation. Contrast injection was performed for an IVC venogram. Through the delivery sheath, a retrievable Denali IVC filter was deployed below the level of the renal veins and above the IVC bifurcation. Limited post deployment venacavagram was performed. The delivery sheath was removed and hemostasis was obtained with manual compression. A dressing was placed. The patient tolerated the procedure well without immediate post procedural complication. FINDINGS: Selective celiac arteriogram demonstrates non conventional branching pattern without  supply to the left gastric artery or significant supply to the gastric fundus, as was demonstrated on preceding CTA. As such, the accessory left gastric artery arising directly from the abdominal aorta was selected with sub selective arteriogram demonstrating a tangle of blood vessels contributing arterial supply to the portion of the stomach containing the endoscopy clip. Caudal division of the accessory left gastric artery supplies the dominant arterial contribution to the portion of the stomach containing the endoscopy clip and as such, was percutaneously coil embolized with multiple overlapping 2 and 3 mm interlock coils to near the vessel's origin. Completion arteriogram demonstrates a technically excellent result with no definitive persistent arterial supply regional to the endoscopy clip. Following the percutaneous coil embolization, several additional selective arteriograms were performed in various obliquities, again confirming no definitive supply to the gastric fundus. As such, additional embolization was not performed. The IVC is patent. No evidence of thrombus, stenosis, or occlusion. No variant venous anatomy. Successful placement of the IVC filter below the level of the renal veins. IMPRESSION: 1. Technically successful percutaneous coil embolization of the caudal division of the accessory left gastric artery serving as the predominant arterial supply to the portion of  the stomach containing the endoscopy clip. 2. Selective celiac arteriogram fails to delineate significant arterial supply to the gastric fundus and in particular, to the portion of stomach regional to the endoscopy clip. 3. Successful ultrasound and fluoroscopically guided placement of an infrarenal retrievable IVC filter. PLAN: - Patient is to lie flat for 4 hours. Continued aggressive resuscitation as per the critical care team. Note, as the patient has experienced an acute large bleeding episode, I would expect the patient's H and H  has not yet reached a nadir but hopefully will stabilize in the next 24-48 hours. - The IVC filter is potentially retrievable. The patient will be assessed for filter retrieval by Interventional Radiology in approximately 8-12 weeks. Further recommendations regarding filter retrieval, continued surveillance or declaration of device permanence, will be made at that time. Electronically Signed   By: Sandi Mariscal M.D.   On: 04/12/2017 07:04    Labs:  CBC: Recent Labs    04/09/17 0255 04/10/17 0715 04/11/17 0328 04/12/17 0207 04/12/17 0358 04/12/17 0412  WBC 15.2* 10.3 8.8  --   --  35.2*  HGB 7.9* 8.4* 7.8* 6.7* 6.8* 9.6*  HCT 23.7* 25.3* 24.0* 19.5* 20.0* 28.3*  PLT 145* 166 176  --   --  72*    COAGS: Recent Labs    04/02/17 1459 04/02/17 1638 04/03/17 0409  INR 1.14  --  1.10  APTT  --  27 25    BMP: Recent Labs    04/09/17 0255 04/10/17 0243 04/11/17 0328 04/12/17 0342 04/12/17 0358  NA 143 143 143 149* 150*  K 3.7 3.6 3.1* 4.0 3.7  CL 115* 116* 113* 116* 108  CO2 23 20* 23 20*  --   GLUCOSE 105* 95 109* 232* 213*  BUN 7 6 5* 12 10  CALCIUM 7.6* 7.7* 8.1* 6.3*  --   CREATININE 0.56 0.62 0.60 1.16* 0.80  GFRNONAA >60 >60 >60 49*  --   GFRAA >60 >60 >60 56*  --     LIVER FUNCTION TESTS: Recent Labs    04/02/17 1459 04/05/17 1504 04/07/17 1006 04/08/17 0248  BILITOT 0.7 0.5 0.3 0.8  AST 106* 47* 24 20  ALT 162* 70* 40 33  ALKPHOS 118 64 51 42  PROT 5.2* 5.2* 4.4* 4.0*  ALBUMIN 2.1* 2.2* 1.9* 1.9*    Assessment and Plan:  GI bleed Embolization and IVC filter placed this am Plan per CCM  Electronically Signed: Franck Vinal A, PA-C 04/12/2017, 11:21 AM   I spent a total of 15 Minutes at the the patient's bedside AND on the patient's hospital floor or unit, greater than 50% of which was counseling/coordinating care for GI arterial embolization

## 2017-04-12 NOTE — Progress Notes (Addendum)
I was called at 12:12 AM about her hypotension, plans to transfer her to the ICU.  She just arrived in ICU, already intubated. I am at the bedside and have asked that she be given K-centra and have called endoscopy team for emergent EGD.

## 2017-04-12 NOTE — Sedation Documentation (Signed)
R Groin level 0. Dr. Pascal Lux to proceed with IVC filter placement via R fem vein.

## 2017-04-12 NOTE — Interval H&P Note (Signed)
History and Physical Interval Note:  04/12/2017 2:09 AM  Anders Simmonds  has presented today for surgery, with the diagnosis of hematemesis  The various methods of treatment have been discussed with the patient and family. After consideration of risks, benefits and other options for treatment, the patient has consented to  Procedure(s): ESOPHAGOGASTRODUODENOSCOPY (EGD) WITH PROPOFOL (N/A) as a surgical intervention .  The patient's history has been reviewed, patient examined, no change in status, stable for surgery.  I have reviewed the patient's chart and labs.  Questions were answered to the patient's satisfaction.     Milus Banister

## 2017-04-12 NOTE — Progress Notes (Signed)
CRITICAL VALUE ALERT  Critical Value:  Lactic Acid 2.8  Date & Time Notied:  2/3 1209  Provider Notified: CCM  Orders Received/Actions taken: Lab value is trending down and we will continue to monitor the patient.

## 2017-04-12 NOTE — Progress Notes (Signed)
CRITICAL VALUE ALERT  Critical Value:  Hgb 5.5  Date & Time Notied:  2/3 1820  Provider Notified: Locums Dr  Orders Received/Actions taken: Transfuse one unit of PRBC

## 2017-04-12 NOTE — Consult Note (Signed)
PULMONARY / CRITICAL CARE MEDICINE   Name: Kayla Wyatt MRN: [id number] DOB: 03/12/1955    ADMISSION DATE:  04/02/2017 CONSULTATION DATE:  04/12/2017  REFERRING MD:  Dr. Shanon Brow   CHIEF COMPLAINT:  GI Bleed   HISTORY OF PRESENT ILLNESS:   62 year old female with PMH of HTN, HLD  Presented to ED On 1/24 with reported two days of ABD pain and bloody diarrhea. States she has had 15+ bowel movements in the morning of arrival. CT A/P with suspicion for ileus. GI consulted. EGD 1/25 with single visible vessel in the setting of a mild ulceration was found in the stomach in which was clipped. Patient continued to have anemia, hypotension, and melena. Was taken for colonoscopy on 1/31 in which revealed diverticulosis. During stay patient was found to have right LE DVT and started on Xarelto on 2/1. 2/2 patient had hemoptysis and Xarelto was stopped, given 4 units RBCs. 2/3 in the early morning patient was found minimally responsive and hypotensive, requiring intubation. Transferred to ICU and GI called.     PAST MEDICAL HISTORY :  She  has a past medical history of Acute blood loss anemia (04/02/2017), Gastrointestinal hemorrhage with melena, Hypercholesteremia, Hypertension, Persistent atrial fibrillation with rapid ventricular response ([place]) (04/07/2017), and Vitamin D deficiency.  PAST SURGICAL HISTORY: She  has a past surgical history that includes Leg Surgery (1974); Esophagogastroduodenoscopy (N/A, 04/03/2017); and Colonoscopy (Left, 04/09/2017).  No Known Allergies  No current facility-administered medications on file prior to encounter.    Current Outpatient Medications on File Prior to Encounter  Medication Sig  . aspirin 81 MG tablet Take 81 mg by mouth daily.  . cholecalciferol (VITAMIN D) 1000 units tablet Take 1,000 Units by mouth 2 (two) times daily.  [name] lisinopril-hydrochlorothiazide (PRINZIDE,ZESTORETIC) 20-12.5 MG tablet Take 1 tablet by mouth daily.  . simvastatin (ZOCOR) 20 MG tablet  Take 1 tablet by mouth daily.    FAMILY HISTORY:  Her has no family status information on file.    SOCIAL HISTORY: She  reports that she has been smoking.  she has never used smokeless tobacco. She reports that she does not drink alcohol or use drugs.  REVIEW OF SYSTEMS:   Unable to review as patient is encephalopathic   SUBJECTIVE:   VITAL SIGNS: BP (!) 84/45   Pulse (!) 56   Temp (!) 97.3 F (36.3 C) (Axillary)   Resp (!) 23   Ht 5\' 3"  (1.6 m)   Wt 77.1 kg (169 lb 15.6 oz)   SpO2 90%   BMI 30.11 kg/m   HEMODYNAMICS:    VENTILATOR SETTINGS:    INTAKE / OUTPUT: I/O last 3 completed shifts: In: 5624.7 [P.O.:1330; I.V.:2297.2; Blood:897.5; IV Piggyback:1100] Out: 501 [Urine:500; Stool:1]  PHYSICAL EXAMINATION: General:  Elderly female, intubated Neuro:  Sedated, moves extremities, pupils intact  HEENT:  ETT in place, dry blood noted to mouth  Cardiovascular:  Tachy, Irregular, no MRG  Lungs:  Clear breath sounds, no wheeze/crackles  Abdomen: Distended, hypoactive bowel sounds  Musculoskeletal:  +2 BLE edema  Skin:  Warm, dry, intact   LABS:  BMET Recent Labs  Lab 04/09/17 0255 04/10/17 0243 04/11/17 0328  NA 143 143 143  K 3.7 3.6 3.1*  CL 115* 116* 113*  CO2 23 20* 23  BUN 7 6 5*  CREATININE 0.56 0.62 0.60  GLUCOSE 105* 95 109*    Electrolytes Recent Labs  Lab 04/09/17 0255 04/10/17 0243 04/11/17 0328  CALCIUM 7.6* 7.7* 8.1*  MG 1.6*  1.9  --     CBC Recent Labs  Lab 04/09/17 0255 04/10/17 0715 04/11/17 0328  WBC 15.2* 10.3 8.8  HGB 7.9* 8.4* 7.8*  HCT 23.7* 25.3* 24.0*  PLT 145* 166 176    Coag's No results for input(s): APTT, INR in the last 168 hours.  Sepsis Markers Recent Labs  Lab 04/06/17 0914  PROCALCITON 0.13    ABG No results for input(s): PHART, PCO2ART, PO2ART in the last 168 hours.  Liver Enzymes Recent Labs  Lab 04/05/17 1504 04/07/17 1006 04/08/17 0248  AST 47* 24 20  ALT 70* 40 33  ALKPHOS 64 51  42  BILITOT 0.5 0.3 0.8  ALBUMIN 2.2* 1.9* 1.9*    Cardiac Enzymes No results for input(s): TROPONINI, PROBNP in the last 168 hours.  Glucose No results for input(s): GLUCAP in the last 168 hours.  Imaging No results found.   STUDIES:  CTA A/P 1/26 > Significant aortic atherosclerosis, with focal high-grade aortic stenosis secondary to mixed calcified and soft plaque, occurring at the level of the inferior mesenteric artery. If there is concern for short distance claudication, correlation with noninvasive lower extremity exam may be useful. Aortic Atherosclerosis (ICD10-I70.0). Atherosclerotic changes at the origin of the mesenteric arteries, however, only the IMA demonstrates evidence of high-grade stenosis, and there appears to be good collateral flow to the IMA distribution secondary to enlarged meandering mesenteric artery/arc of [place]. It is uncertain on the CTA as to what degree atherosclerotic changes/stenosis of the common hepatic artery are contributing to flow compromise. There are 2 right renal arteries and 3 left renal arteries. Atherosclerotic changes involve the origin of all of the renal arteries, with at least 50% stenosis secondary to mixed calcified and soft plaque at the origin of the largest bilateral renal Arteries. ECHO 1/28 > The cavity size was normal. Wall thickness was normal. Systolic function was normal. The estimated ejection fraction was in the range of 55% to 60%. Wall motion was normal; there were no regional wall motion abnormalities. Features are consistent with a pseudonormal left ventricular filling pattern, with concomitant abnormal relaxation and increased filling pressure (grade 2 diastolic dysfunction). NM 1/29 > No active bleed  CTA Chest 1/29 > 1. 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 Recommend tissue sampling. 2. No pulmonary embolism seen, with mild study  limitations detailed Above. 3. Small right pleural effusion.  Mild bibasilar atelectasis. 4. Mild emphysematous changes within the lung apices VAS [place] Lower Venous 1/29 > DVT in Right Gastrocnemius  EGD 1/25 > - Normal esophagus. - A single visible vessel in the setting of a mild ulceration was found in the stomach. Clip (MR unsafe) was placed- Normal examined duodenum.- No specimens collected. Colonoscopy 1/31 >  Diverticulosis in the sigmoid colon.  CULTURES: MRSA 1/24 > Negative  Blood 1/26 > Negative  Urine Culture 1/27 > Negative   ANTIBIOTICS: Zosyn 1/24 Vancomycin 1/24 Rocephin 1/26 >>   SIGNIFICANT EVENTS: 1/24 > Presents to ED   LINES/TUBES: ETT 2/3 >> PIV   DISCUSSION: 62 year old female presents with melena and ABD pain. EGD on 1/25 with single visible vessel in the setting of mild ulceration where clips was placed. Patient continued to have hypotension and bleeding. On 1/31 underwent colonoscopy in which revealed diverticulosis. During stay patient was found to have right LE DVT and started on Xarelto on 2/1. 2/2 patient had hemoptysis and Xarelto was stopped, given 4 units RBCs.  2/3 in the early morning patient was found minimally responsive and hypotensive, requiring intubation. Transferred to ICU and GI called.     ASSESSMENT / PLAN:  PULMONARY A: Respiratory Insufficieny in setting of GI Bleed  ABG 6.909/64.7/513  4.1 cm mass in the aortopulmonary window  P:   Vent Support  VAP Bundle  Trend CXR/ABG   CARDIOVASCULAR A:  Hypotension in setting of Hemorraghic Shock  A.Fib RVR  Diastolic HF (K2HC EF 62-37)  H/O HTN, HLD  P:  Cardiac Monitoring  Cardiology Following  Maintain MAP >65 (Currently on Levophed)   RENAL A:   Anion Gap Metabolic Acidosis  Hypokalemia  P:   Trend BMP Replace electrolytes as indicated  Bicarb push x 2 now  GASTROINTESTINAL A:   Suspected upper GI bleed  Melena, Hemoptysis  -EGD 1/25 clip placed in setting of mild  ulceration found in stomach -Colonoscopy 1/31 with Diverticulosis in the sigmoid colon Acute Transaminitis > Improved  Uterine Prolapse   P:   GI Following > Plans to scope once arrives to ICU NPO PPI BID  Trend H&H q6H   HEMATOLOGIC A:   Acute Blood Loss Anemia secondary to suspected upper GI bleed  RLE DVT  P:  Trend CBC  Hold all anticoagulation > Xarelto started on 2/1, received 2 doses, stopped due to hemoptysis  S/P 4 RBC and 2 units FFP Administer one unit Plts  Kcentra ordered  Needs IVC filter placement   INFECTIOUS A:   E.Coli 1/2 Blood Culture 1/24 Blood Culture 1/26 Negative  P:   Trend WBC and Fever Curve Discontinue Rocephin   ENDOCRINE A:   Hyperglycemia    P:   Trend Glucose  SSI   NEUROLOGIC A:   Metabolic Encephalopathy  P:   RASS goal: 0/-1 Monitor  Fentanyl/Versed PRN to achieve RASS   FAMILY  - Updates: Family updated   - Inter-disciplinary family meet or Palliative Care meeting due by: 04/19/2017     Hayden Pedro, AGACNP-BC [facility]  Pgr: [phone]  PCCM Pgr: [phone]

## 2017-04-12 NOTE — Procedures (Signed)
Intubation Procedure Note Navina Wohlers [id number] 1955-06-10   STARTED AT 12:23AM Procedure: Intubation Indications: Airway protection and maintenance and resp insufficiency  Procedure Details Consent: Unable to obtain consent because of altered level of consciousness. Time Out: Verified patient identification, verified procedure, site/side was marked, verified correct patient position, special equipment/implants available, medications/allergies/relevent history reviewed, required imaging and test results available.  Performed  Maximum sterile technique was used including gloves and mask.  MAC and 3    Evaluation Hemodynamic Status: Persistent hypotension treated with pressors and fluid; O2 sats: stable throughout Patient's Current Condition: stable Complications: No apparent complications Patient did tolerate procedure well. Chest X-ray ordered to verify placement.  CXR: pending.   Rise Paganini Breon Rehm 04/12/2017

## 2017-04-12 NOTE — Op Note (Signed)
[facility] Patient Name: Kayla Wyatt Procedure Date : 04/12/2017 MRN: [id number] Attending MD: Milus Banister , MD Date of Birth: 1955-11-06 CSN: [id number] Age: 62 Admit Type: Inpatient Procedure:                Upper GI endoscopy Indications:              Hematemesis; EGD 1 week ago with Dr. [name] treated a                            visible vessel in a small ulcer in the body of                            stomach with a single endoclip. Xarelto started 36                            hours ago for DVT (she received two doses). Providers:                Milus Banister, MD, Presley Raddle, RN, Vista Lawman, RN, Alan Mulder, Technician Referring MD:              Medicines:                Midazolam 2 mg IV, intubated in ICU on pressor Complications:            No immediate complications. Estimated blood loss:                            None. Estimated Blood Loss:     Estimated blood loss: none. Procedure:                Pre-Anesthesia Assessment:                           - Prior to the procedure, a History and Physical                            was performed, and patient medications and                            allergies were reviewed. The patient's tolerance of                            previous anesthesia was also reviewed. The risks                            and benefits of the procedure and the sedation                            options and risks were discussed with the patient.                            All questions were answered, and informed consent  was obtained. Prior Anticoagulants: The patient has                            taken Xarelto (rivaroxaban), last dose was day of                            procedure. ASA Grade Assessment: IV - A patient                            with severe systemic disease that is a constant                            threat to life. After reviewing the risks and                           benefits, the patient was deemed in satisfactory                            condition to undergo the procedure.                           After obtaining informed consent, the endoscope was                            passed under direct vision. Throughout the                            procedure, the patient's blood pressure, pulse, and                            oxygen saturations were monitored continuously. The                            ( [id number]) [id number] was introduced through the                            mouth, and advanced to the second part of duodenum.                            The upper GI endoscopy was accomplished without                            difficulty. The patient tolerated the procedure                            fairly well, pressor had to be intermittently                            adjusted Scope In: Scope Out: Findings:      There was a large amount of clot and fresh red blood in her stomach.       Over the course of about 60 minutes I used lavage, suction, mechanical       slicing of clot with snare, attempt with Jabier Mutton  net to clear the stomach       of blood, blood clot. I was never able to visualize the site of active       oozing but it must be from the proximal 1/4 to 1/3 of her stomach as       that part of the stomach that I could never see.      The exam was otherwise without abnormality. Impression:               - There was a large amount of clot and fresh red                            blood in her stomach. Over the course of about 60                            minutes I used lavage, suction, mechanical slicing                            of clot with snare, attempts with Jabier Mutton net to clear                            the stomach of blood, blood clot. I was never able                            to visualize the site of active oozing but it must                            be from the proximal 1/4 to 1/3 of her stomach as                             that part of the stomach that was covered by clot,                            fresh blood that I could not clear.                           - I assume she is rebleeding from the visible                            vessel, ulcer in the body of the stomach that was                            clipped by Dr. [name] last week. If the clip is still                            adherent, it may aid angiography. Recommendation:           - I discussed the case with Dr. Pascal Lux from IR who                            is planning emergent angiogram to localize, stop  the bleeding. Procedure Code(s):        --- Professional ---                           [phone], Esophagogastroduodenoscopy, flexible,                            transoral; diagnostic, including collection of                            specimen(s) by brushing or washing, when performed                            (separate procedure) Diagnosis Code(s):        --- Professional ---                           K92.2, Gastrointestinal hemorrhage, unspecified                           K92.0, Hematemesis CPT copyright 2016 [employer]. All rights reserved. The codes documented in this report are preliminary and upon coder review may  be revised to meet current compliance requirements. Milus Banister, MD 04/12/2017 3:38:28 AM This report has been signed electronically. Number of Addenda: 0

## 2017-04-12 NOTE — Progress Notes (Signed)
CRITICAL VALUE ALERT  Critical Value:  Lactic 8.7  Date & Time Notied:  04/12/17 0502  Provider Notified: Warren Lacy  Orders Received/Actions taken: pt in IR, no orders given at this time

## 2017-04-12 NOTE — Sedation Documentation (Signed)
6Fr sheath removed from R femoral artery by [facility], RTR. Hemostasis achieved by manual pressure. Groin level 0.

## 2017-04-12 NOTE — Progress Notes (Signed)
Patient transported from 2M05 to IR and back with no complications.

## 2017-04-12 NOTE — Progress Notes (Signed)
[facility] Progress Note Covering Drs. Hung/Mann this weekend    Since last GI note: Early this AM EGD followed by IR angiogram. No obvious extrav noted, but coil embo to a proximal gastric artery branch that fed the region of the stomach with the still adherent endoclip, presumed site of rebleeding.  Objective: Vital signs in last 24 hours: Temp:  [95 F (35 C)-99.7 F (37.6 C)] 97.7 F (36.5 C) (02/03 0728) Pulse Rate:  [29-133] 57 (02/03 0800) Resp:  [5-33] 5 (02/03 0800) BP: (56-142)/(35-112) 90/49 (02/03 0800) SpO2:  [65 %-100 %] 100 % (02/03 0800) Arterial Line BP: (104-108)/(43-54) 108/43 (02/03 0800) FiO2 (%):  [40 %-100 %] 40 % (02/03 0618) Weight:  [164 lb 10.9 oz (74.7 kg)] 164 lb 10.9 oz (74.7 kg) (02/03 0056) Last BM Date: 04/11/17 General:  Intubated, sedated, on pressor(levo) Heart: regular rate and rythm Abdomen: soft, non-tender, non-distended, normal bowel sounds   Lab Results: Recent Labs    04/10/17 0715 04/11/17 0328 04/12/17 0207 04/12/17 0358 04/12/17 0412  WBC 10.3 8.8  --   --  35.2*  HGB 8.4* 7.8* 6.7* 6.8* 9.6*  PLT 166 176  --   --  72*  MCV 96.6 98.0  --   --  88.2   Recent Labs    04/10/17 0243 04/11/17 0328 04/12/17 0342 04/12/17 0358  NA 143 143 149* 150*  K 3.6 3.1* 4.0 3.7  CL 116* 113* 116* 108  CO2 20* 23 20*  --   GLUCOSE 95 109* 232* 213*  BUN 6 5* 12 10  CREATININE 0.62 0.60 1.16* 0.80  CALCIUM 7.7* 8.1* 6.3*  --    Medications: Scheduled Meds: . Gerhardt's butt cream   Topical TID  . insulin aspart  2-6 Units Subcutaneous Q4H  . lidocaine-EPINEPHrine      . midazolam      . pantoprazole  40 mg Intravenous Q12H  . sodium chloride flush  3 mL Intravenous Q12H   Continuous Infusions: . sodium chloride    . sodium chloride    . fentaNYL infusion INTRAVENOUS 150 mcg/hr (04/12/17 0800)  . norepinephrine (LEVOPHED) Adult infusion 17 mcg/min (04/12/17 0809)  .  sodium bicarbonate  infusion 1000 mL 125  mL/hr at 04/12/17 0800  . vasopressin (PITRESSIN) infusion - *FOR SHOCK*     PRN Meds:.sodium chloride, acetaminophen **OR** acetaminophen, fentaNYL, midazolam, midazolam, nicotine, ondansetron **OR** ondansetron (ZOFRAN) IV    Assessment/Plan: 62 y.o. female with massive upper GI bleeding, presumed to be from proximal stomach small ulcer with visible vessel noted, clipped last week by Dr. [name].  Continue IV PPI, supportive care by CC Team.  This admit, she has received 10 units PRBC (6 of them were in the past 12-24 hours), 2 units FFP, 1 pack platelets, 1 dose of K-centra.  Acute DVT, IVC filter placed last night at time of mesenteric angio  Mediastinal mass; workup delayed due to severe GI bleed, DVT.  Very likely a malignancy.  Drs. Hung/Mann to resume her GI care tomorrow AM.   Milus Banister, MD  04/12/2017, 9:13 AM [facility] Pager [phone]

## 2017-04-12 NOTE — Progress Notes (Signed)
Pt transported from IR to 37M 05 without event.  Rt will monitor.

## 2017-04-12 NOTE — Procedures (Signed)
Arterial Catheter Insertion Procedure Note Kayla Wyatt [id number] 1955/03/16  Procedure: Insertion of Arterial Catheter  Indications: Blood pressure monitoring  Procedure Details Consent: Unable to obtain consent because of altered level of consciousness. Time Out: Verified patient identification, verified procedure, site/side was marked, verified correct patient position, special equipment/implants available, medications/allergies/relevent history reviewed, required imaging and test results available.  Performed  Maximum sterile technique was used including antiseptics, cap, gloves, gown, hand hygiene, mask and sheet. Skin prep: Chlorhexidine; local anesthetic administered 20 gauge catheter was inserted into right radial artery using the Seldinger technique.  Evaluation Blood flow good; BP tracing good. Complications: No apparent complications.   Romeo Apple 04/12/2017

## 2017-04-12 NOTE — Sedation Documentation (Signed)
6 Fr Sheath removed from R fem vein by [employer], RTR.

## 2017-04-13 ENCOUNTER — Inpatient Hospital Stay (HOSPITAL_COMMUNITY): Payer: BLUE CROSS/BLUE SHIELD

## 2017-04-13 ENCOUNTER — Encounter (HOSPITAL_COMMUNITY): Payer: Self-pay | Admitting: Radiology

## 2017-04-13 DIAGNOSIS — J9601 Acute respiratory failure with hypoxia: Secondary | ICD-10-CM

## 2017-04-13 HISTORY — PX: IR EMBO ART  VEN HEMORR LYMPH EXTRAV  INC GUIDE ROADMAPPING: IMG5450

## 2017-04-13 HISTORY — PX: IR ANGIOGRAM VISCERAL SELECTIVE: IMG657

## 2017-04-13 HISTORY — PX: IR US GUIDE VASC ACCESS RIGHT: IMG2390

## 2017-04-13 HISTORY — PX: IR FLUORO GUIDE CV LINE RIGHT: IMG2283

## 2017-04-13 HISTORY — PX: IR ANGIOGRAM SELECTIVE EACH ADDITIONAL VESSEL: IMG667

## 2017-04-13 LAB — BASIC METABOLIC PANEL
Anion gap: 12 (ref 5–15)
BUN: 12 mg/dL (ref 6–20)
CO2: 28 mmol/L (ref 22–32)
Calcium: 6.4 mg/dL — CL (ref 8.9–10.3)
Chloride: 105 mmol/L (ref 101–111)
Creatinine, Ser: 1.46 mg/dL — ABNORMAL HIGH (ref 0.44–1.00)
GFR calc Af Amer: 43 mL/min — ABNORMAL LOW (ref >60)
GFR calc non Af Amer: 37 mL/min — ABNORMAL LOW (ref >60)
Glucose, Bld: 223 mg/dL — ABNORMAL HIGH (ref 65–99)
Potassium: 3.9 mmol/L (ref 3.5–5.1)
Sodium: 145 mmol/L (ref 135–145)

## 2017-04-13 LAB — GLUCOSE, CAPILLARY
Glucose-Capillary: 123 mg/dL — ABNORMAL HIGH (ref 65–99)
Glucose-Capillary: 127 mg/dL — ABNORMAL HIGH (ref 65–99)
Glucose-Capillary: 142 mg/dL — ABNORMAL HIGH (ref 65–99)
Glucose-Capillary: 148 mg/dL — ABNORMAL HIGH (ref 65–99)
Glucose-Capillary: 158 mg/dL — ABNORMAL HIGH (ref 65–99)
Glucose-Capillary: 183 mg/dL — ABNORMAL HIGH (ref 65–99)

## 2017-04-13 LAB — PREPARE RBC (CROSSMATCH)

## 2017-04-13 LAB — BLOOD GAS, ARTERIAL
Acid-Base Excess: 10.4 mmol/L — ABNORMAL HIGH (ref 0.0–2.0)
Bicarbonate: 33.7 mmol/L — ABNORMAL HIGH (ref 20.0–28.0)
Drawn by: 398981
FIO2: 40
MECHVT: 420 mL
O2 Saturation: 99 %
PEEP: 5 cmH2O
Patient temperature: 98.6
RATE: 18 resp/min
pCO2 arterial: 39.1 mmHg (ref 32.0–48.0)
pH, Arterial: 7.545 — ABNORMAL HIGH (ref 7.350–7.450)
pO2, Arterial: 106 mmHg (ref 83.0–108.0)

## 2017-04-13 LAB — LACTIC ACID, PLASMA
Lactic Acid, Venous: 5.9 mmol/L (ref 0.5–1.9)
Lactic Acid, Venous: 4.2 mmol/L (ref 0.5–1.9)

## 2017-04-13 LAB — POCT I-STAT 3, ART BLOOD GAS (G3+)
Acid-Base Excess: 12 mmol/L — ABNORMAL HIGH (ref 0.0–2.0)
Acid-Base Excess: 6 mmol/L — ABNORMAL HIGH (ref 0.0–2.0)
Bicarbonate: 36.3 mmol/L — ABNORMAL HIGH (ref 20.0–28.0)
Bicarbonate: 30.9 mmol/L — ABNORMAL HIGH (ref 20.0–28.0)
O2 Saturation: 96 %
O2 Saturation: 97 %
Patient temperature: 98.1
TCO2: 38 mmol/L — ABNORMAL HIGH (ref 22–32)
TCO2: 32 mmol/L (ref 22–32)
pCO2 arterial: 46.8 mmHg (ref 32.0–48.0)
pCO2 arterial: 43.4 mmHg (ref 32.0–48.0)
pH, Arterial: 7.496 — ABNORMAL HIGH (ref 7.350–7.450)
pH, Arterial: 7.46 — ABNORMAL HIGH (ref 7.350–7.450)
pO2, Arterial: 73 mmHg — ABNORMAL LOW (ref 83.0–108.0)
pO2, Arterial: 91 mmHg (ref 83.0–108.0)

## 2017-04-13 LAB — BPAM PLATELET PHERESIS
Blood Product Expiration Date: 201902032359
ISSUE DATE / TIME: 201902030211
Unit Type and Rh: 6200

## 2017-04-13 LAB — CBC
HCT: 13 % — ABNORMAL LOW (ref 36.0–46.0)
Hemoglobin: 4.5 g/dL — CL (ref 12.0–15.0)
MCH: 30 pg (ref 26.0–34.0)
MCHC: 34.6 g/dL (ref 30.0–36.0)
MCV: 86.7 fL (ref 78.0–100.0)
Platelets: 66 10*3/uL — ABNORMAL LOW (ref 150–400)
RBC: 1.5 MIL/uL — ABNORMAL LOW (ref 3.87–5.11)
RDW: 15.7 % — ABNORMAL HIGH (ref 11.5–15.5)
WBC: 23.5 10*3/uL — ABNORMAL HIGH (ref 4.0–10.5)

## 2017-04-13 LAB — PHOSPHORUS: Phosphorus: 5 mg/dL — ABNORMAL HIGH (ref 2.5–4.6)

## 2017-04-13 LAB — PROTIME-INR
INR: 1.97
Prothrombin Time: 22.3 seconds — ABNORMAL HIGH (ref 11.4–15.2)

## 2017-04-13 LAB — PREPARE PLATELET PHERESIS: Unit division: 0

## 2017-04-13 LAB — HEMOGLOBIN AND HEMATOCRIT, BLOOD
HCT: 22 % — ABNORMAL LOW (ref 36.0–46.0)
HCT: 25.7 % — ABNORMAL LOW (ref 36.0–46.0)
HCT: 27.2 % — ABNORMAL LOW (ref 36.0–46.0)
HCT: 27.6 % — ABNORMAL LOW (ref 36.0–46.0)
Hemoglobin: 7.8 g/dL — ABNORMAL LOW (ref 12.0–15.0)
Hemoglobin: 8.9 g/dL — ABNORMAL LOW (ref 12.0–15.0)
Hemoglobin: 9.3 g/dL — ABNORMAL LOW (ref 12.0–15.0)
Hemoglobin: 9.6 g/dL — ABNORMAL LOW (ref 12.0–15.0)

## 2017-04-13 LAB — MAGNESIUM: Magnesium: 2 mg/dL (ref 1.7–2.4)

## 2017-04-13 MED ORDER — SODIUM CHLORIDE 0.9 % IV SOLN
Freq: Once | INTRAVENOUS | Status: AC
  Administered 2017-04-13: 17:00:00 via INTRAVENOUS

## 2017-04-13 MED ORDER — SODIUM CHLORIDE 0.9 % IV SOLN
INTRAVENOUS | Status: DC
  Administered 2017-04-13: 10:00:00 via INTRAVENOUS

## 2017-04-13 MED ORDER — SODIUM CHLORIDE 0.9 % IV SOLN: Freq: Once | INTRAVENOUS | Status: DC

## 2017-04-13 MED ORDER — SODIUM CHLORIDE 0.9 % IV SOLN
Freq: Once | INTRAVENOUS | Status: AC
  Administered 2017-04-13: 10:00:00 via INTRAVENOUS

## 2017-04-13 MED ORDER — TRANEXAMIC ACID 1000 MG/10ML IV SOLN
1000.0000 mg | INTRAVENOUS | Status: AC
  Administered 2017-04-13: 1000 mg via INTRAVENOUS
  Filled 2017-04-13: qty 10

## 2017-04-13 MED ORDER — LIDOCAINE HCL 1 % IJ SOLN
INTRAMUSCULAR | Status: AC
  Filled 2017-04-13: qty 20

## 2017-04-13 MED ORDER — LIDOCAINE HCL (PF) 1 % IJ SOLN
INTRAMUSCULAR | Status: DC | PRN
  Administered 2017-04-13: 15 mL

## 2017-04-13 MED ORDER — MIDAZOLAM HCL 2 MG/2ML IJ SOLN
INTRAMUSCULAR | Status: DC | PRN
  Administered 2017-04-13: 1 mg via INTRAVENOUS

## 2017-04-13 MED ORDER — IOPAMIDOL (ISOVUE-300) INJECTION 61%
INTRAVENOUS | Status: AC
  Administered 2017-04-13: 50 mL
  Filled 2017-04-13: qty 100

## 2017-04-13 MED ORDER — NOREPINEPHRINE BITARTRATE 1 MG/ML IV SOLN
0.0000 ug/min | INTRAVENOUS | Status: DC
  Administered 2017-04-13: 44 ug/min via INTRAVENOUS
  Administered 2017-04-13: 35 ug/min via INTRAVENOUS
  Administered 2017-04-13: 40 ug/min via INTRAVENOUS
  Filled 2017-04-13 (×5): qty 16

## 2017-04-13 MED ORDER — IOPAMIDOL (ISOVUE-300) INJECTION 61%
INTRAVENOUS | Status: AC
  Administered 2017-04-13: 30 mL
  Filled 2017-04-13: qty 100

## 2017-04-13 MED ORDER — SODIUM CHLORIDE 0.9 % IV SOLN
Freq: Once | INTRAVENOUS | Status: AC
  Administered 2017-04-13: 14:00:00 via INTRAVENOUS

## 2017-04-13 MED ORDER — SODIUM CHLORIDE 0.9 % IV SOLN
2.0000 g | Freq: Once | INTRAVENOUS | Status: AC
  Administered 2017-04-13: 2 g via INTRAVENOUS
  Filled 2017-04-13: qty 20

## 2017-04-13 MED ORDER — MIDAZOLAM HCL 2 MG/2ML IJ SOLN
INTRAMUSCULAR | Status: AC
  Filled 2017-04-13: qty 6

## 2017-04-13 MED FILL — Medication: Qty: 1 | Status: AC

## 2017-04-13 NOTE — Procedures (Signed)
[facility]  Just had bloody bowel movement Going back to IR for repeat angiogram Will consult surgery in case partial gastrectomy needed INR elevated: 1.95  Plan: FFP 2 Units now Platelets now Continue to monitor serial CBC, goal Hgb > 8gm/dL Repeat Echo> new cardiac cause of shock? Will consult surgery  We have not been able to contact surgery, I feel the best approach is repeat angiography and embolization attempt for gastric ulcer.  Additional cc time 40 minutes  Roselie Awkward, MD [facility] Pager: [phone] Cell: [phone] After 3pm or if no response, call [phone]    .

## 2017-04-13 NOTE — Sedation Documentation (Signed)
Patient is resting comfortably. 

## 2017-04-13 NOTE — Progress Notes (Signed)
Patient transported to CT and back to room 1S31 without complications.

## 2017-04-13 NOTE — Progress Notes (Signed)
Nutrition Follow-up  DOCUMENTATION CODES:   Not applicable  INTERVENTION:   When able to begin enteral nutrition, recommend:  Vital AF 1.2 at 50 ml/h (1200 ml per day)  Provides 1440 kcal, 90 gm protein, 973 ml free water daily  NUTRITION DIAGNOSIS:   Inadequate oral intake related to inability to eat as evidenced by NPO status.  Ongoing  GOAL:   Patient will meet greater than or equal to 90% of their needs  Unmet  MONITOR:   Vent status, Labs, I & O's  ASSESSMENT:   Kayla Wyatt is a 62 y.o. female past medical history significant for high blood pressure high cholesterol presents with abdominal pain and diarrhea for about 6 days.  Patient states she had taken multiple doses daily of NSAIDs to relieve her pain.  Had one episode of vomiting the day before she came to the hospital.  And then had diarrhea with black stool the day she came to the emergency room.  Patient has no history of blood transfusion.  No history of GI bleed.  No history of ulcers.  S/P embolization procedure for GI bleed and IVC filter placement on 2/3. Patient was transferred to the ICU and intubated on 2/3 after being found minimally responsive and hypotensive. Unknown source of continued bleeding, concern for retroperitoneal bleeding. Remains NPO with OGT to suction. Plans for repeat angiogram. Patient is currently intubated on ventilator support MV: 7 L/min Temp (24hrs), Avg:98.7 F (37.1 C), Min:98.1 F (36.7 C), Max:99.2 F (37.3 C)  Labs reviewed. Phosphorus 5 (H), lactic acid 5.9 (H) CBG's: [id number] Medications reviewed and include KCl.   Diet Order:  Diet NPO time specified  EDUCATION NEEDS:   Education needs have been addressed  Skin:  Skin Assessment: Reviewed RN Assessment  Last BM:  2/2  Height:   Ht Readings from Last 1 Encounters:  04/12/17 5\' 6"  (1.676 m)    Weight:   Wt Readings from Last 1 Encounters:  04/13/17 185 lb 13.6 oz (84.3 kg)   04/08/17 155 lb  10.3 oz (70.6 kg)   04/03/17 137 lb 2 oz (62.2 kg)   Weight up with I/O + 29 L since admission  Ideal Body Weight:  52.3 kg  BMI:  Body mass index is 30 kg/m.  Estimated Nutritional Needs:   Kcal:  1380  Protein:  85-100 gm  Fluid:  1.6-1.8 L   Molli Barrows, RD, LDN, [place] Pager [phone] After Hours Pager [phone]

## 2017-04-13 NOTE — Sedation Documentation (Signed)
Vital signs stable. 

## 2017-04-13 NOTE — Consult Note (Signed)
Reason for Consult:gi bleed Referring Physician: Dr. Hosie Spangle is an 62 y.o. female.  HPI: 87 yof intubated/sedated just returned from IR.  She has history of admission for gi bleed 1/25 that underwent egd with Dr [name] and showed gastric ulcer with visible vessel that was clipped.  She had csc that showed diverticular disease.  On 2/1 she was found to have a rle dvt and was started on xarelto.  This was complicated by an additional gi bleed.  A repeat egd was done by Dr Ardis Hughs but was not really able to visualize anything due to volume of blood.  She underwent angio and coiling of a caudal division of the accessory left gastric artery.  A filter was also placed.  She then has continued to bleed.  She has received multiple blood products.  We were called as she was going to angio again. I have discussed and reviewed with Dr Laurence Ferrari.  He has coiled the inferior division of splenic artery with associated short gastrics in area of prior clip.  The area with clip has now appeared to be completely devascularized as has about 30-40 % of the spleen.   She has returned to icu on pressors sedated/intubated.  She has blood per rectum.    Past Medical History:  Diagnosis Date  . Acute blood loss anemia 04/02/2017  . Gastrointestinal hemorrhage with melena   . Hypercholesteremia   . Hypertension   . Persistent atrial fibrillation with rapid ventricular response ([place]) 04/07/2017  . Vitamin D deficiency     Past Surgical History:  Procedure Laterality Date  . COLONOSCOPY Left 04/09/2017   Procedure: COLONOSCOPY;  Surgeon: Carol Ada, MD;  Location: [facility];  Service: Endoscopy;  Laterality: Left;  . ESOPHAGOGASTRODUODENOSCOPY N/A 04/03/2017   Procedure: ESOPHAGOGASTRODUODENOSCOPY (EGD);  Surgeon: Carol Ada, MD;  Location: [facility];  Service: Endoscopy;  Laterality: N/A;  . ESOPHAGOGASTRODUODENOSCOPY (EGD) WITH PROPOFOL N/A 04/12/2017   Procedure: ESOPHAGOGASTRODUODENOSCOPY (EGD)  WITH PROPOFOL;  Surgeon: Milus Banister, MD;  Location: [facility];  Service: Endoscopy;  Laterality: N/A;  . IR ANGIOGRAM FOLLOW UP STUDY  04/13/2017  . IR ANGIOGRAM SELECTIVE EACH ADDITIONAL VESSEL  04/12/2017  . IR ANGIOGRAM SELECTIVE EACH ADDITIONAL VESSEL  04/13/2017  . IR ANGIOGRAM SELECTIVE EACH ADDITIONAL VESSEL  04/13/2017  . IR ANGIOGRAM VISCERAL SELECTIVE  04/12/2017  . IR ANGIOGRAM VISCERAL SELECTIVE  04/13/2017  . IR EMBO ART  VEN HEMORR LYMPH EXTRAV  INC GUIDE ROADMAPPING  04/12/2017  . IR EMBO ART  VEN HEMORR LYMPH EXTRAV  INC GUIDE ROADMAPPING  04/13/2017  . IR FLUORO GUIDE CV LINE RIGHT  04/13/2017  . IR IVC FILTER PLMT / S&I /IMG GUID/MOD SED  04/12/2017  . IR US GUIDE VASC ACCESS RIGHT  04/12/2017  . IR US GUIDE VASC ACCESS RIGHT  04/13/2017  . IR US GUIDE VASC ACCESS RIGHT  04/13/2017  . LEG SURGERY  1974   Blood Clot Removal     History reviewed. No pertinent family history.  Social History:  reports that she has been smoking.  she has never used smokeless tobacco. She reports that she does not drink alcohol or use drugs.  Allergies: No Known Allergies  Medications: I have reviewed the patient's current medications.  Results for orders placed or performed during the hospital encounter of 04/02/17 (from the past 48 hour(s))  Glucose, capillary     Status: Abnormal   Collection Time: 04/12/17 12:53 AM  Result Value Ref Range   Glucose-Capillary  227 (H) 65 - 99 mg/dL  Prepare Pheresed Platelets     Status: None   Collection Time: 04/12/17  1:33 AM  Result Value Ref Range   Unit Number [id number]    Blood Component Type PLTP LR1 PAS    Unit division 00    Status of Unit ISSUED,FINAL    Transfusion Status      OK TO TRANSFUSE Performed at [facility] [address], [place], [place] [address]   I-STAT 3, arterial blood gas (G3+)     Status: Abnormal   Collection Time: 04/12/17  1:52 AM  Result Value Ref Range   pH, Arterial 6.913 (LL) 7.350 - 7.450   pCO2 arterial  63.8 (H) 32.0 - 48.0 mmHg   pO2, Arterial 511.0 (H) 83.0 - 108.0 mmHg   Bicarbonate 12.9 (L) 20.0 - 28.0 mmol/L   TCO2 15 (L) 22 - 32 mmol/L   O2 Saturation 100.0 %   Acid-base deficit 18.0 (H) 0.0 - 2.0 mmol/L   Patient temperature 98.0 F    Collection site RADIAL, ALLEN'S TEST ACCEPTABLE    Drawn by Operator    Sample type ARTERIAL    Comment NOTIFIED PHYSICIAN   Hemoglobin and hematocrit, blood     Status: Abnormal   Collection Time: 04/12/17  2:07 AM  Result Value Ref Range   Hemoglobin 6.7 (LL) 12.0 - 15.0 g/dL    Comment: REPEATED TO VERIFY CRITICAL RESULT CALLED TO, READ BACK BY AND VERIFIED WITH: S.WILLIS,RN 0250 04/12/17 H.MILES    HCT 19.5 (L) 36.0 - 46.0 %    Comment: Performed at [facility], [place] [address] [place], [place] [address]  Prepare RBC     Status: None   Collection Time: 04/12/17  2:10 AM  Result Value Ref Range   Order Confirmation      ORDER PROCESSED BY BLOOD BANK Performed at [facility], [place] [address] [place], [place] [address]   Glucose, capillary     Status: Abnormal   Collection Time: 04/12/17  3:35 AM  Result Value Ref Range   Glucose-Capillary 183 (H) 65 - 99 mg/dL   Comment 1 Capillary Specimen    Comment 2 Notify RN   Basic metabolic panel     Status: Abnormal   Collection Time: 04/12/17  3:42 AM  Result Value Ref Range   Sodium 149 (H) 135 - 145 mmol/L   Potassium 4.0 3.5 - 5.1 mmol/L   Chloride 116 (H) 101 - 111 mmol/L   CO2 20 (L) 22 - 32 mmol/L   Glucose, Bld 232 (H) 65 - 99 mg/dL   BUN 12 6 - 20 mg/dL   Creatinine, Ser 1.16 (H) 0.44 - 1.00 mg/dL   Calcium 6.3 (LL) 8.9 - 10.3 mg/dL    Comment: CRITICAL RESULT CALLED TO, READ BACK BY AND VERIFIED WITH: WOFFORD C,RN 04/12/17 0447 WAYK    GFR calc non Af Amer 49 (L) >60 mL/min   GFR calc Af Amer 56 (L) >60 mL/min    Comment: (NOTE) The eGFR has been calculated using the CKD EPI equation. This calculation has not been validated in all clinical situations. eGFR's  persistently <60 mL/min signify possible Chronic Kidney Disease.    Anion gap 13 5 - 15    Comment: Performed at [facility] [address], [place], [place] [address]  Magnesium     Status: Abnormal   Collection Time: 04/12/17  3:42 AM  Result Value Ref  Range   Magnesium 1.5 (L) 1.7 - 2.4 mg/dL    Comment: Performed at [facility] [address], [place], [place] [address]  Phosphorus     Status: Abnormal   Collection Time: 04/12/17  3:42 AM  Result Value Ref Range   Phosphorus 7.3 (H) 2.5 - 4.6 mg/dL    Comment: Performed at [facility] [address], [place], [place] [address]  I-STAT 3, arterial blood gas (G3+)     Status: Abnormal   Collection Time: 04/12/17  3:47 AM  Result Value Ref Range   pH, Arterial 7.195 (LL) 7.350 - 7.450   pCO2 arterial 55.7 (H) 32.0 - 48.0 mmHg   pO2, Arterial 403.0 (H) 83.0 - 108.0 mmHg   Bicarbonate 21.5 20.0 - 28.0 mmol/L   TCO2 23 22 - 32 mmol/L   O2 Saturation 100.0 %   Acid-base deficit 6.0 (H) 0.0 - 2.0 mmol/L   Patient temperature 98.9 F    Collection site RADIAL, ALLEN'S TEST ACCEPTABLE    Drawn by RT    Sample type ARTERIAL   I-STAT, chem 8     Status: Abnormal   Collection Time: 04/12/17  3:58 AM  Result Value Ref Range   Sodium 150 (H) 135 - 145 mmol/L   Potassium 3.7 3.5 - 5.1 mmol/L   Chloride 108 101 - 111 mmol/L   BUN 10 6 - 20 mg/dL   Creatinine, Ser 0.80 0.44 - 1.00 mg/dL   Glucose, Bld 213 (H) 65 - 99 mg/dL   Calcium, Ion 0.97 (L) 1.15 - 1.40 mmol/L   TCO2 23 22 - 32 mmol/L   Hemoglobin 6.8 (LL) 12.0 - 15.0 g/dL   HCT 20.0 (L) 36.0 - 46.0 %  CBC with Differential/Platelet     Status: Abnormal   Collection Time: 04/12/17  4:12 AM  Result Value Ref Range   WBC 35.2 (H) 4.0 - 10.5 K/uL   RBC 3.21 (L) 3.87 - 5.11 MIL/uL   Hemoglobin 9.6 (L) 12.0 - 15.0 g/dL    Comment: REPEATED TO VERIFY DELTA CHECK NOTED POST TRANSFUSION SPECIMEN    HCT 28.3 (L) 36.0 - 46.0 %   MCV 88.2 78.0 - 100.0 fL     Comment: REPEATED TO VERIFY DELTA CHECK NOTED POST TRANSFUSION SPECIMEN    MCH 29.9 26.0 - 34.0 pg   MCHC 33.9 30.0 - 36.0 g/dL   RDW 14.9 11.5 - 15.5 %   Platelets 72 (L) 150 - 400 K/uL    Comment: DELTA CHECK NOTED REPEATED TO VERIFY SPECIMEN CHECKED FOR CLOTS PLATELET COUNT CONFIRMED BY SMEAR    Neutrophils Relative % 88 %   Lymphocytes Relative 7 %   Monocytes Relative 5 %   Eosinophils Relative 0 %   Basophils Relative 0 %   Neutro Abs 30.9 (H) 1.7 - 7.7 K/uL   Lymphs Abs 2.5 0.7 - 4.0 K/uL   Monocytes Absolute 1.8 (H) 0.1 - 1.0 K/uL   Eosinophils Absolute 0.0 0.0 - 0.7 K/uL   Basophils Absolute 0.0 0.0 - 0.1 K/uL   RBC Morphology RARE NRBCs     Comment: POLYCHROMASIA PRESENT   WBC Morphology MILD LEFT SHIFT (1-5% METAS, OCC MYELO, OCC BANDS)     Comment: ATYPICAL LYMPHOCYTES TOXIC GRANULATION Performed at [facility], [address], [place], [place] [address]   Lactic acid, plasma     Status: Abnormal   Collection Time: 04/12/17  4:12 AM  Result Value Ref Range  Lactic Acid, Venous 8.7 (HH) 0.5 - 1.9 mmol/L    Comment: CRITICAL RESULT CALLED TO, READ BACK BY AND VERIFIED WITH: Carlis Abbott A,RN 04/12/17 [place] Performed at [facility], [place] [address], [place], [place] [address]   Glucose, capillary     Status: Abnormal   Collection Time: 04/12/17  7:27 AM  Result Value Ref Range   Glucose-Capillary 158 (H) 65 - 99 mg/dL   Comment 1 Capillary Specimen    Comment 2 Notify RN   I-STAT 3, arterial blood gas (G3+)     Status: Abnormal   Collection Time: 04/12/17  9:42 AM  Result Value Ref Range   pH, Arterial 7.328 (L) 7.350 - 7.450   pCO2 arterial 55.7 (H) 32.0 - 48.0 mmHg   pO2, Arterial 87.0 83.0 - 108.0 mmHg   Bicarbonate 29.2 (H) 20.0 - 28.0 mmol/L   TCO2 31 22 - 32 mmol/L   O2 Saturation 96.0 %   Acid-Base Excess 3.0 (H) 0.0 - 2.0 mmol/L   Patient temperature HIDE    Collection site ARTERIAL LINE    Drawn by Operator    Sample type ARTERIAL    Basic metabolic panel     Status: Abnormal   Collection Time: 04/12/17 11:07 AM  Result Value Ref Range   Sodium 146 (H) 135 - 145 mmol/L   Potassium 3.3 (L) 3.5 - 5.1 mmol/L   Chloride 106 101 - 111 mmol/L   CO2 26 22 - 32 mmol/L   Glucose, Bld 166 (H) 65 - 99 mg/dL   BUN 11 6 - 20 mg/dL   Creatinine, Ser 1.15 (H) 0.44 - 1.00 mg/dL   Calcium 6.7 (L) 8.9 - 10.3 mg/dL   GFR calc non Af Amer 49 (L) >60 mL/min   GFR calc Af Amer 57 (L) >60 mL/min    Comment: (NOTE) The eGFR has been calculated using the CKD EPI equation. This calculation has not been validated in all clinical situations. eGFR's persistently <60 mL/min signify possible Chronic Kidney Disease.    Anion gap 14 5 - 15    Comment: Performed at [facility] [address] [place], [place] [address]  Hemoglobin and hematocrit, blood     Status: Abnormal   Collection Time: 04/12/17 11:07 AM  Result Value Ref Range   Hemoglobin 8.8 (L) 12.0 - 15.0 g/dL   HCT 24.7 (L) 36.0 - 46.0 %    Comment: Performed at [facility], [place] [address], [place], [place] [address]  Lactic acid, plasma     Status: Abnormal   Collection Time: 04/12/17 11:07 AM  Result Value Ref Range   Lactic Acid, Venous 2.8 (HH) 0.5 - 1.9 mmol/L    Comment: CRITICAL RESULT CALLED TO, READ BACK BY AND VERIFIED WITH: RN P DICKINSON AT [phone] MARTINB Performed at [facility], [place] [address], [place], [place] [address]   Glucose, capillary     Status: Abnormal   Collection Time: 04/12/17 12:43 PM  Result Value Ref Range   Glucose-Capillary 125 (H) 65 - 99 mg/dL   Comment 1 Venous Specimen    Comment 2 Notify RN   Glucose, capillary     Status: Abnormal   Collection Time: 04/12/17  4:39 PM  Result Value Ref Range   Glucose-Capillary 120 (H) 65 - 99 mg/dL   Comment 1 Venous Specimen    Comment 2 Notify RN   Hemoglobin and hematocrit, blood     Status: Abnormal  Collection Time: 04/12/17  5:23 PM  Result Value Ref Range    Hemoglobin 5.5 (LL) 12.0 - 15.0 g/dL    Comment: RESULT REPEATED AND VERIFIED CRITICAL RESULT CALLED TO, READ BACK BY AND VERIFIED WITH: RN P DICKINSON AT [phone] MARTINB DELTA CHECK NOTED    HCT 15.8 (L) 36.0 - 46.0 %    Comment: Performed at [facility] [address] [place], [place] [address]  Prepare RBC     Status: None   Collection Time: 04/12/17  6:25 PM  Result Value Ref Range   Order Confirmation      ORDER PROCESSED BY BLOOD BANK Performed at [facility], [place] [address] [place], [place] [address]   Glucose, capillary     Status: Abnormal   Collection Time: 04/12/17  7:16 PM  Result Value Ref Range   Glucose-Capillary 131 (H) 65 - 99 mg/dL   Comment 1 Notify RN   I-STAT 3, arterial blood gas (G3+)     Status: Abnormal   Collection Time: 04/12/17  7:59 PM  Result Value Ref Range   pH, Arterial 7.496 (H) 7.350 - 7.450   pCO2 arterial 46.8 32.0 - 48.0 mmHg   pO2, Arterial 73.0 (L) 83.0 - 108.0 mmHg   Bicarbonate 36.3 (H) 20.0 - 28.0 mmol/L   TCO2 38 (H) 22 - 32 mmol/L   O2 Saturation 96.0 %   Acid-Base Excess 12.0 (H) 0.0 - 2.0 mmol/L   Patient temperature 98.1 F    Collection site ARTERIAL LINE    Drawn by Nurse    Sample type ARTERIAL   Basic metabolic panel     Status: Abnormal   Collection Time: 04/12/17  8:00 PM  Result Value Ref Range   Sodium 148 (H) 135 - 145 mmol/L   Potassium 3.0 (L) 3.5 - 5.1 mmol/L   Chloride 113 (H) 101 - 111 mmol/L   CO2 28 22 - 32 mmol/L   Glucose, Bld 107 (H) 65 - 99 mg/dL   BUN 9 6 - 20 mg/dL   Creatinine, Ser 1.09 (H) 0.44 - 1.00 mg/dL   Calcium 6.0 (LL) 8.9 - 10.3 mg/dL    Comment: CRITICAL RESULT CALLED TO, READ BACK BY AND VERIFIED WITH: FLYNT F,RN 04/12/17 2108 WAYK    GFR calc non Af Amer 52 (L) >60 mL/min   GFR calc Af Amer >60 >60 mL/min    Comment: (NOTE) The eGFR has been calculated using the CKD EPI equation. This calculation has not been validated in all clinical situations. eGFR's  persistently <60 mL/min signify possible Chronic Kidney Disease.    Anion gap 7 5 - 15    Comment: Performed at [facility] [address], [place], [place] [address]  Hemoglobin and hematocrit, blood     Status: Abnormal   Collection Time: 04/12/17  8:00 PM  Result Value Ref Range   Hemoglobin 9.2 (L) 12.0 - 15.0 g/dL    Comment: POST TRANSFUSION SPECIMEN   HCT 25.8 (L) 36.0 - 46.0 %    Comment: Performed at [facility] [address] [place], [place] [address]  Hemoglobin and hematocrit, blood     Status: Abnormal   Collection Time: 04/12/17 11:39 PM  Result Value Ref Range   Hemoglobin 7.8 (L) 12.0 - 15.0 g/dL   HCT 22.0 (L) 36.0 - 46.0 %    Comment: Performed at [facility], [place] [address], [place], [place] [address]  Glucose,  capillary     Status: Abnormal   Collection Time: 04/13/17 12:07 AM  Result Value Ref Range   Glucose-Capillary 148 (H) 65 - 99 mg/dL   Comment 1 Notify RN   Glucose, capillary     Status: Abnormal   Collection Time: 04/13/17  3:28 AM  Result Value Ref Range   Glucose-Capillary 183 (H) 65 - 99 mg/dL   Comment 1 Notify RN   Blood gas, arterial     Status: Abnormal   Collection Time: 04/13/17  3:30 AM  Result Value Ref Range   FIO2 40.00    Delivery systems VENTILATOR    Mode PRESSURE REGULATED VOLUME CONTROL    VT 420 mL   LHR 18 resp/min   Peep/cpap 5.0 cm H20   pH, Arterial 7.545 (H) 7.350 - 7.450   pCO2 arterial 39.1 32.0 - 48.0 mmHg   pO2, Arterial 106 83.0 - 108.0 mmHg   Bicarbonate 33.7 (H) 20.0 - 28.0 mmol/L   Acid-Base Excess 10.4 (H) 0.0 - 2.0 mmol/L   O2 Saturation 99.0 %   Patient temperature 98.6    Collection site A-LINE    Drawn by [id number]    Sample type ARTERIAL DRAW    Allens test (pass/fail) PASS PASS  CBC     Status: Abnormal   Collection Time: 04/13/17  3:46 AM  Result Value Ref Range   WBC 23.5 (H) 4.0 - 10.5 K/uL   RBC 1.50 (L) 3.87 - 5.11 MIL/uL   Hemoglobin 4.5 (LL) 12.0 - 15.0 g/dL     Comment: REPEATED TO VERIFY CRITICAL RESULT CALLED TO, READ BACK BY AND VERIFIED WITH: N.PUARSE,RN 0431 04/13/17 M.CAMPBELL    HCT 13.0 (L) 36.0 - 46.0 %   MCV 86.7 78.0 - 100.0 fL   MCH 30.0 26.0 - 34.0 pg   MCHC 34.6 30.0 - 36.0 g/dL   RDW 15.7 (H) 11.5 - 15.5 %   Platelets 66 (L) 150 - 400 K/uL    Comment: CONSISTENT WITH PREVIOUS RESULT Performed at [facility] [address], [place], [place] [address]   Basic metabolic panel     Status: Abnormal   Collection Time: 04/13/17  3:46 AM  Result Value Ref Range   Sodium 145 135 - 145 mmol/L   Potassium 3.9 3.5 - 5.1 mmol/L   Chloride 105 101 - 111 mmol/L   CO2 28 22 - 32 mmol/L   Glucose, Bld 223 (H) 65 - 99 mg/dL   BUN 12 6 - 20 mg/dL   Creatinine, Ser 1.46 (H) 0.44 - 1.00 mg/dL   Calcium 6.4 (LL) 8.9 - 10.3 mg/dL    Comment: CRITICAL RESULT CALLED TO, READ BACK BY AND VERIFIED WITH: PEARSE N,RN 04/13/17 0510 WAYK    GFR calc non Af Amer 37 (L) >60 mL/min   GFR calc Af Amer 43 (L) >60 mL/min    Comment: (NOTE) The eGFR has been calculated using the CKD EPI equation. This calculation has not been validated in all clinical situations. eGFR's persistently <60 mL/min signify possible Chronic Kidney Disease.    Anion gap 12 5 - 15    Comment: Performed at [facility] [address], [place], [place] [address]  Magnesium     Status: None   Collection Time: 04/13/17  3:46 AM  Result Value Ref Range   Magnesium 2.0 1.7 - 2.4 mg/dL    Comment: Performed at [facility] [address], [place],  [address]  Phosphorus  Status: Abnormal   Collection Time: 04/13/17  3:46 AM  Result Value Ref Range   Phosphorus 5.0 (H) 2.5 - 4.6 mg/dL    Comment: Performed at [facility] [address] [place], [place] [address]  Lactic acid, plasma     Status: Abnormal   Collection Time: 04/13/17  3:46 AM  Result Value Ref Range   Lactic Acid, Venous 5.9 (HH) 0.5 - 1.9 mmol/L    Comment: CRITICAL RESULT  CALLED TO, READ BACK BY AND VERIFIED WITH: PEARSE N,RN 04/13/17 0509 WAYK Performed at [facility], [place] [address] [place], [place] [address]   Prepare RBC     Status: None   Collection Time: 04/13/17  4:30 AM  Result Value Ref Range   Order Confirmation      ORDER PROCESSED BY BLOOD BANK Performed at [facility], [place] [address] [place], [place] [address]   Glucose, capillary     Status: Abnormal   Collection Time: 04/13/17  8:40 AM  Result Value Ref Range   Glucose-Capillary 158 (H) 65 - 99 mg/dL   Comment 1 Capillary Specimen    Comment 2 Notify RN   I-STAT 3, arterial blood gas (G3+)     Status: Abnormal   Collection Time: 04/13/17  9:14 AM  Result Value Ref Range   pH, Arterial 7.460 (H) 7.350 - 7.450   pCO2 arterial 43.4 32.0 - 48.0 mmHg   pO2, Arterial 91.0 83.0 - 108.0 mmHg   Bicarbonate 30.9 (H) 20.0 - 28.0 mmol/L   TCO2 32 22 - 32 mmol/L   O2 Saturation 97.0 %   Acid-Base Excess 6.0 (H) 0.0 - 2.0 mmol/L   Patient temperature HIDE    Collection site ARTERIAL LINE    Drawn by RT    Sample type ARTERIAL   Protime-INR     Status: Abnormal   Collection Time: 04/13/17  9:26 AM  Result Value Ref Range   Prothrombin Time 22.3 (H) 11.4 - 15.2 seconds   INR 1.97     Comment: Performed at [facility], [place] [address], [place], [place] [address]  Prepare Pheresed Platelets     Status: None (Preliminary result)   Collection Time: 04/13/17 10:00 AM  Result Value Ref Range   Unit Number [id number]    Blood Component Type PLTP LR2 PAS    Unit division 00    Status of Unit REL FROM [facility]    Transfusion Status OK TO TRANSFUSE    Unit Number [id number]    Blood Component Type PLTP LR2 PAS    Unit division 00    Status of Unit ALLOCATED    Transfusion Status      OK TO TRANSFUSE Performed at [facility], [place] [address], [place], [place] [address]   Lactic acid, plasma     Status: Abnormal   Collection Time: 04/13/17 10:30 AM  Result Value  Ref Range   Lactic Acid, Venous 4.2 (HH) 0.5 - 1.9 mmol/L    Comment: CRITICAL RESULT CALLED TO, READ BACK BY AND VERIFIED WITH: P DICKINSON RN 1331 04/13/2017 BY A BENNETT Performed at [facility], [place] [address] [place], [place] [address]   Glucose, capillary     Status: Abnormal   Collection Time: 04/13/17 10:53 AM  Result Value Ref Range   Glucose-Capillary 127 (H) 65 - 99 mg/dL   Comment 1 Capillary Specimen    Comment 2 Notify RN   Hemoglobin  and hematocrit, blood     Status: Abnormal   Collection Time: 04/13/17 12:30 PM  Result Value Ref Range   Hemoglobin 9.6 (L) 12.0 - 15.0 g/dL    Comment: POST TRANSFUSION SPECIMEN   HCT 27.6 (L) 36.0 - 46.0 %    Comment: Performed at [facility] [address] [place], [place] [address]  Prepare fresh frozen plasma     Status: None (Preliminary result)   Collection Time: 04/13/17  2:11 PM  Result Value Ref Range   Unit Number [id number]    Blood Component Type THWPLS APHR2    Unit division 00    Status of Unit ISSUED    Transfusion Status OK TO TRANSFUSE    Unit Number [id number]    Blood Component Type THWPLS APHR1    Unit division 00    Status of Unit ISSUED    Transfusion Status      OK TO TRANSFUSE Performed at [facility] [address], [place], [place] [address]   Glucose, capillary     Status: Abnormal   Collection Time: 04/13/17  2:53 PM  Result Value Ref Range   Glucose-Capillary 142 (H) 65 - 99 mg/dL   Comment 1 Capillary Specimen    Comment 2 Notify RN   Hemoglobin and hematocrit, blood     Status: Abnormal   Collection Time: 04/13/17  4:00 PM  Result Value Ref Range   Hemoglobin 8.9 (L) 12.0 - 15.0 g/dL   HCT 25.7 (L) 36.0 - 46.0 %    Comment: Performed at [facility] [address], [place], [place] [address]  Prepare RBC     Status: None   Collection Time: 04/13/17  4:30 PM  Result Value Ref Range   Order Confirmation      ORDER PROCESSED BY BLOOD BANK Performed at  [facility], [place] [address] [place], [place] [address]     Ct Abdomen Pelvis Wo Contrast  Result Date: 04/13/2017 CLINICAL DATA:  Evaluate for retroperitoneal hematoma status post arterial access. EXAM: CT ABDOMEN AND PELVIS WITHOUT CONTRAST TECHNIQUE: Multidetector CT imaging of the abdomen and pelvis was performed following the standard protocol without IV contrast. COMPARISON:  04/03/2017 FINDINGS: Lower chest: Moderate bilateral pleural effusions with overlying compressive type atelectasis. Hepatobiliary: No focal liver abnormality. Vicarious excretion of contrast material into the gallbladder is noted. Pancreas: Unremarkable. No pancreatic ductal dilatation or surrounding inflammatory changes. Spleen: Normal in size without focal abnormality. Adrenals/Urinary Tract: Normal adrenal glands. There is a striated nephrographic appearance of both kidneys, right greater than left. IV contrast material is identified within a dilated left renal collecting system and ureter. Asymmetrically diminished contrast within the right renal collecting system identified. Urinary bladder collapsed around a Foley catheter. Stomach/Bowel: There is a gastrostomy tube with tip in the distal stomach. Moderate to increased attenuation material is identified within the gastric lumen, image 17 of series 3. There also high attenuation material is identified within the small and large bowel loops. Vascular/Lymphatic: Aortic atherosclerosis. No aneurysm. Filter identified within the IVC. There is no adenopathy. Reproductive: Status post hysterectomy. No adnexal masses. Other: There is a small volume of ascites identified. Small to moderate volume of ascites within the right upper quadrant of the abdomen and right lower quadrant of the abdomen and pelvis. There is diffuse body wall edema compatible with anasarca. No retroperitoneal hematoma identified. Musculoskeletal: No acute or significant osseous findings. IMPRESSION: 1. No  evidence for retroperitoneal hematoma. 2.  Striated nephrographic appearance of both kidneys identified, right greater than left. Imaging findings are favored to represent sequelae of acute tubular necrosis. Not excluded but favored less likely would be an embolic phenomenon resulting in areas of renal infarct. 3. Left-sided hydronephrosis and hydroureter. Etiology indeterminate. Cannot rule out distal ureteral obstruction. 4. Bilateral pleural effusions, ascites and diffuse body wall edema compatible with fluid overload. 5. Intermediate to high attenuation material within the stomach. Although conceivably this could be related to it administration of enteric contrast material ongoing bleeding within the gastric lumen is not excluded. 6.  Aortic Atherosclerosis (ICD10-I70.0). Electronically Signed   By: Kerby Moors M.D.   On: 04/13/2017 12:00   Ir Angiogram Visceral Selective  Result Date: 04/13/2017 INDICATION: 62 year old female with gastric ulcer and a visible bleeding vessel. Patient underwent embolization yesterday but continues to demonstrate evidence of upper GI bleeding. Repeat angiography is requested. EXAM: IR EMBO ART VEN HEMORR LYMPH EXTRAV INC GUIDE ROADMAPPING; ARTERIOGRAPHY; IR ULTRASOUND GUIDANCE VASC ACCESS RIGHT; SELECTIVE VISCERAL ARTERIOGRAPHY; ADDITIONAL ARTERIOGRAPHY; IR RIGHT FLOURO GUIDE CV LINE 1. Ultrasound-guided access right common femoral artery 2. Catheterization and angiogram of the left gastric artery 3. Catheterization and angiogram the celiac axis 4. Catheterization and angiogram of the splenic artery 5. Catheterization and angiogram of the superior division of the splenic artery 6. Catheterization and angiogram of the inferior division of the splenic artery 7. Coil embolization of the inferior division of the splenic artery MEDICATIONS: None ANESTHESIA/SEDATION: Moderate (conscious) sedation was employed during this procedure. A total of Versed 1 mg was administered  intravenously. The patient was on a fentanyl drip. Moderate Sedation Time: 60 minutes. The patient's level of consciousness and vital signs were monitored continuously by radiology nursing throughout the procedure under my direct supervision. CONTRAST:  80 mL Isovue 300 FLUOROSCOPY TIME:  Fluoroscopy Time: 13 minutes 6 seconds (1299 mGy). COMPLICATIONS: None immediate. PROCEDURE: Informed consent was obtained from the patient following explanation of the procedure, risks, benefits and alternatives. The patient understands, agrees and consents for the procedure. All questions were addressed. A time out was performed prior to the initiation of the procedure. Maximal barrier sterile technique utilized including caps, mask, sterile gowns, sterile gloves, large sterile drape, hand hygiene, and Betadine prep. The right common femoral artery was interrogated with ultrasound and found to be widely patent. An image was obtained and stored for the medical record. Local anesthesia was attained by infiltration with 1% lidocaine. A small dermatotomy was made. Under real-time sonographic guidance, the vessel was punctured with a 21 gauge micropuncture needle. Using standard technique, the initial micro needle was exchanged over a 0.018 micro wire for a transitional 4 [place] micro sheath. The micro sheath was then exchanged over a 0.035 wire for a 5 French vascular sheath. A C2 cobra catheter was advanced over a Bentson wire into the abdominal aorta. The catheter was used to select the left gastric artery which arises directly from the aorta. Catheter angiography was performed. Successful coil embolization of the left gastric artery on the prior angiogram. There is no significant perfusion in the region of the vascular clip. The C2 cobra catheter was then advanced into the celiac artery and a celiac arteriogram was performed. The vessels are significantly attenuated consistent with diffuse vaso spasm. Branches of the short gastric  arteries are identified in the region of the previously placed endoscopic clip. Using a glidewire, the 5 [place] catheter was successfully advanced into the splenic artery. Angiography was then performed in multiple obliquities. The  splenic artery divides into a superior and inferior division. Both divisions give rise to some a short gastric arteries. A Renegade high flow microcatheter was successfully advanced into the distal splenic artery and then into the superior division. Superior division short gastric arteries appear to provide blood flow more superiorly along the gastric fundus. The microcatheter was then successfully navigated into the inferior division of the splenic artery. The short gastric arteries are in close proximity to the endovascular clip. No definite active extravasation was identified. The decision was made to perform coil embolization. This will likely devascularize approximately 30-40% of the lower pole of the spleen. Coil embolization was performed using a combination of 2 and 3 mm interlock and vortex coils. Post embolization arteriography demonstrates complete cessation of flow in the arteries in the region of the previously placed endovascular clip. The catheters were removed. Hemostasis was attained with the assistance of an ExoSeal extra arterial vascular plug. IMPRESSION: 1. Successful prior coil embolization of the left gastric artery. 2. Coil embolization of the inferior division of the splenic artery and is associated short gastric arteries in the region of the previously placed endovascular clip. Angiography and embolization therapy has now been maximized. If the patient continues to hemorrhage, repeat endoscopy or surgery would likely be required. Signed, Criselda Peaches, MD [facility] Radiology Electronically Signed   By: Jacqulynn Cadet M.D.   On: 04/13/2017 19:09   Ir Angiogram Visceral Selective  Result Date:  04/12/2017 INDICATION: History of recent upper GI bleed, post endoscopy approximately 1 week ago resulting in placement of an endoscopy clip at the location of a bleeding gastric ulcer. Patient subsequently found to have a lower extremity DVT and was started on anti coagulation (patient has received 2 doses of Xarelto). Unfortunately, this resulted in a recurrent large upper GI bleed. Repeat upper endoscopy demonstrates a large amount of blood products within the stomach with inability to achieve hemostasis from endoscopic approach. As such, request made for emergent mesenteric arteriogram and potential percutaneous coil embolization as well as placement of an IVC filter for temporary caval interruption purposes. EXAM: 1. ULTRASOUND GUIDANCE FOR VENOUS AND ARTERIAL ACCESS 2. SELECTIVE CELIAC ARTERIOGRAM. 3. SELECTIVE ACCESSORY LEFT GASTRIC ARTERIOGRAM. 4. SELECTIVE ARTERIOGRAM OF DISTAL TRIBUTARY OF THE LEFT GASTRIC ARTERY (3rd ORDER) AND PERCUTANEOUS COIL EMBOLIZATION 5. IVC CATHETERIZATION AND VENOGRAM 6. IVC FILTER INSERTION COMPARISON:  CTA of the abdomen pelvis - 04/03/2017; chest CT - 04/07/2017 MEDICATIONS: None. ANESTHESIA/SEDATION: Versed 58 mg IV Sedation Time: minutes; The patient was continuously monitored during the procedure by the interventional radiology nurse under my direct supervision. CONTRAST:  75 cc Isovue 300 FLUOROSCOPY TIME:  9 Minutes 24 seconds (992 mGy) COMPLICATIONS: None immediate. PROCEDURE: Informed written consent was obtained from the patient's brother following explanation of the procedure, risks, benefits and alternatives. A time out was performed prior to the initiation of the procedure. The right groin was prepped and draped in usual sterile fashion. Maximal barrier sterile technique utilized including caps, mask, sterile gowns, sterile gloves, large sterile drape, hand hygiene, and chlorhexidine prep. The right femoral head was marked fluoroscopically. Under ultrasound guidance,  the right common femoral artery was accessed with a micropuncture kit after the overlying soft tissues were anesthetized with 1% lidocaine. An ultrasound image was saved for documentation purposes. The micropuncture sheath was exchanged for a 6 [place] vascular sheath over a Bentson wire. (Note, a 6 [place] vascular sheath was selected for access given suspected hemodynamically significant stenosis within the  mid aspect of the abdominal aorta and potential necessity for angioplasty.) A closure arteriogram was performed through the side of the sheath confirming access within the right common femoral artery, however the 6 French vascular sheath was noted to be near occlusive secondary to a combination of atherosclerotic plaque as well as the vasospasm. With the use of a Kumpe catheter, a Bentson wire was advanced to the level of the caudal aspect of the thoracic aorta. Limited abdominal aortogram was performed demonstrating patency of the abdominal aorta with the 5 French catheter in place and as such abdominal aortic angioplasty would not be necessary and the decision was made to downsized the vascular sheath a 5 [place]. Again, closure arteriogram was performed demonstrating preferential flow through the right internal iliac artery with atretic flow surrounding the vascular sheath. Given patient's critical state the decision was made to proceed with the mesenteric arteriogram and embolization in an expeditious manner in lieu of abandoning the femoral access for a brachial approach. Over a Bentson wire, a Mickelson catheter was advanced to the level of the thoracic aorta where it was back bled and flushed. The catheter was then utilized to select the celiac artery and a selective celiac arteriogram was performed The Mickelson catheter was then utilized to select the left gastric artery and a selective left gastric arteriogram was performed. With the use of an 0.014 fathom microwire, a regular Renegade microcatheter was  advanced into a distal tributary of the left gastric artery serving as the predominant arterial supply to the tangle of vessels regional to the endoscopy clip. Selective injection confirmed appropriate positioning. The distal aspect of the branch vessel was then percutaneously coil embolized with multiple overlapping 2 mm and 3 mm soft and regular interlock coils. The microcatheter was withdrawn into the central aspect of the left gastric artery and a post embolization arteriogram was performed. The microcatheter was removed and a completion left gastric arteriogram was performed via the Mickelson catheter Again, the Mickelson catheter was utilized to select the celiac artery and selective celiac arteriograms were performed in various obliquities. Images were reviewed and the arteriogram portion of the procedure was terminated. Completion closure arteriogram was performed. All wires, catheters and sheaths were removed from the patient. Hemostasis was achieved at the right groin access site with deployment of a ExoSeal closure device and manual compression. _________________________________________________________ Next, attention was made towards placement of the IVC filter. Under sterile condition and local anesthesia, right common femoral venous access was performed with ultrasound. An ultrasound image was saved and sent to PACS. Over a guidewire, the IVC filter delivery sheath and inner dilator were advanced into the IVC just above the IVC bifurcation. Contrast injection was performed for an IVC venogram. Through the delivery sheath, a retrievable Denali IVC filter was deployed below the level of the renal veins and above the IVC bifurcation. Limited post deployment venacavagram was performed. The delivery sheath was removed and hemostasis was obtained with manual compression. A dressing was placed. The patient tolerated the procedure well without immediate post procedural complication. FINDINGS: Selective celiac  arteriogram demonstrates non conventional branching pattern without supply to the left gastric artery or significant supply to the gastric fundus, as was demonstrated on preceding CTA. As such, the accessory left gastric artery arising directly from the abdominal aorta was selected with sub selective arteriogram demonstrating a tangle of blood vessels contributing arterial supply to the portion of the stomach containing the endoscopy clip. Caudal division of the accessory left gastric artery supplies the  dominant arterial contribution to the portion of the stomach containing the endoscopy clip and as such, was percutaneously coil embolized with multiple overlapping 2 and 3 mm interlock coils to near the vessel's origin. Completion arteriogram demonstrates a technically excellent result with no definitive persistent arterial supply regional to the endoscopy clip. Following the percutaneous coil embolization, several additional selective arteriograms were performed in various obliquities, again confirming no definitive supply to the gastric fundus. As such, additional embolization was not performed. The IVC is patent. No evidence of thrombus, stenosis, or occlusion. No variant venous anatomy. Successful placement of the IVC filter below the level of the renal veins. IMPRESSION: 1. Technically successful percutaneous coil embolization of the caudal division of the accessory left gastric artery serving as the predominant arterial supply to the portion of the stomach containing the endoscopy clip. 2. Selective celiac arteriogram fails to delineate significant arterial supply to the gastric fundus and in particular, to the portion of stomach regional to the endoscopy clip. 3. Successful ultrasound and fluoroscopically guided placement of an infrarenal retrievable IVC filter. PLAN: - Patient is to lie flat for 4 hours. Continued aggressive resuscitation as per the critical care team. Note, as the patient has experienced an  acute large bleeding episode, I would expect the patient's H and H has not yet reached a nadir but hopefully will stabilize in the next 24-48 hours. - The IVC filter is potentially retrievable. The patient will be assessed for filter retrieval by Interventional Radiology in approximately 8-12 weeks. Further recommendations regarding filter retrieval, continued surveillance or declaration of device permanence, will be made at that time. Electronically Signed   By: Sandi Mariscal M.D.   On: 04/12/2017 07:04   Ir Angiogram Selective Each Additional Vessel  Result Date: 04/13/2017 INDICATION: 62 year old female with gastric ulcer and a visible bleeding vessel. Patient underwent embolization yesterday but continues to demonstrate evidence of upper GI bleeding. Repeat angiography is requested. EXAM: IR EMBO ART VEN HEMORR LYMPH EXTRAV INC GUIDE ROADMAPPING; ARTERIOGRAPHY; IR ULTRASOUND GUIDANCE VASC ACCESS RIGHT; SELECTIVE VISCERAL ARTERIOGRAPHY; ADDITIONAL ARTERIOGRAPHY; IR RIGHT FLOURO GUIDE CV LINE 1. Ultrasound-guided access right common femoral artery 2. Catheterization and angiogram of the left gastric artery 3. Catheterization and angiogram the celiac axis 4. Catheterization and angiogram of the splenic artery 5. Catheterization and angiogram of the superior division of the splenic artery 6. Catheterization and angiogram of the inferior division of the splenic artery 7. Coil embolization of the inferior division of the splenic artery MEDICATIONS: None ANESTHESIA/SEDATION: Moderate (conscious) sedation was employed during this procedure. A total of Versed 1 mg was administered intravenously. The patient was on a fentanyl drip. Moderate Sedation Time: 60 minutes. The patient's level of consciousness and vital signs were monitored continuously by radiology nursing throughout the procedure under my direct supervision. CONTRAST:  80 mL Isovue 300 FLUOROSCOPY TIME:  Fluoroscopy Time: 13 minutes 6 seconds (1299 mGy).  COMPLICATIONS: None immediate. PROCEDURE: Informed consent was obtained from the patient following explanation of the procedure, risks, benefits and alternatives. The patient understands, agrees and consents for the procedure. All questions were addressed. A time out was performed prior to the initiation of the procedure. Maximal barrier sterile technique utilized including caps, mask, sterile gowns, sterile gloves, large sterile drape, hand hygiene, and Betadine prep. The right common femoral artery was interrogated with ultrasound and found to be widely patent. An image was obtained and stored for the medical record. Local anesthesia was attained by infiltration with 1% lidocaine. A small dermatotomy was  made. Under real-time sonographic guidance, the vessel was punctured with a 21 gauge micropuncture needle. Using standard technique, the initial micro needle was exchanged over a 0.018 micro wire for a transitional 4 [place] micro sheath. The micro sheath was then exchanged over a 0.035 wire for a 5 French vascular sheath. A C2 cobra catheter was advanced over a Bentson wire into the abdominal aorta. The catheter was used to select the left gastric artery which arises directly from the aorta. Catheter angiography was performed. Successful coil embolization of the left gastric artery on the prior angiogram. There is no significant perfusion in the region of the vascular clip. The C2 cobra catheter was then advanced into the celiac artery and a celiac arteriogram was performed. The vessels are significantly attenuated consistent with diffuse vaso spasm. Branches of the short gastric arteries are identified in the region of the previously placed endoscopic clip. Using a glidewire, the 5 [place] catheter was successfully advanced into the splenic artery. Angiography was then performed in multiple obliquities. The splenic artery divides into a superior and inferior division. Both divisions give rise to some a short  gastric arteries. A Renegade high flow microcatheter was successfully advanced into the distal splenic artery and then into the superior division. Superior division short gastric arteries appear to provide blood flow more superiorly along the gastric fundus. The microcatheter was then successfully navigated into the inferior division of the splenic artery. The short gastric arteries are in close proximity to the endovascular clip. No definite active extravasation was identified. The decision was made to perform coil embolization. This will likely devascularize approximately 30-40% of the lower pole of the spleen. Coil embolization was performed using a combination of 2 and 3 mm interlock and vortex coils. Post embolization arteriography demonstrates complete cessation of flow in the arteries in the region of the previously placed endovascular clip. The catheters were removed. Hemostasis was attained with the assistance of an ExoSeal extra arterial vascular plug. IMPRESSION: 1. Successful prior coil embolization of the left gastric artery. 2. Coil embolization of the inferior division of the splenic artery and is associated short gastric arteries in the region of the previously placed endovascular clip. Angiography and embolization therapy has now been maximized. If the patient continues to hemorrhage, repeat endoscopy or surgery would likely be required. Signed, Criselda Peaches, MD [facility] Radiology Electronically Signed   By: Jacqulynn Cadet M.D.   On: 04/13/2017 19:09   Ir Angiogram Selective Each Additional Vessel  Result Date: 04/13/2017 INDICATION: 62 year old female with gastric ulcer and a visible bleeding vessel. Patient underwent embolization yesterday but continues to demonstrate evidence of upper GI bleeding. Repeat angiography is requested. EXAM: IR EMBO ART VEN HEMORR LYMPH EXTRAV INC GUIDE ROADMAPPING; ARTERIOGRAPHY; IR ULTRASOUND GUIDANCE  VASC ACCESS RIGHT; SELECTIVE VISCERAL ARTERIOGRAPHY; ADDITIONAL ARTERIOGRAPHY; IR RIGHT FLOURO GUIDE CV LINE 1. Ultrasound-guided access right common femoral artery 2. Catheterization and angiogram of the left gastric artery 3. Catheterization and angiogram the celiac axis 4. Catheterization and angiogram of the splenic artery 5. Catheterization and angiogram of the superior division of the splenic artery 6. Catheterization and angiogram of the inferior division of the splenic artery 7. Coil embolization of the inferior division of the splenic artery MEDICATIONS: None ANESTHESIA/SEDATION: Moderate (conscious) sedation was employed during this procedure. A total of Versed 1 mg was administered intravenously. The patient was on a fentanyl drip. Moderate Sedation Time: 60 minutes. The patient's level of consciousness and vital signs were monitored  continuously by radiology nursing throughout the procedure under my direct supervision. CONTRAST:  80 mL Isovue 300 FLUOROSCOPY TIME:  Fluoroscopy Time: 13 minutes 6 seconds (1299 mGy). COMPLICATIONS: None immediate. PROCEDURE: Informed consent was obtained from the patient following explanation of the procedure, risks, benefits and alternatives. The patient understands, agrees and consents for the procedure. All questions were addressed. A time out was performed prior to the initiation of the procedure. Maximal barrier sterile technique utilized including caps, mask, sterile gowns, sterile gloves, large sterile drape, hand hygiene, and Betadine prep. The right common femoral artery was interrogated with ultrasound and found to be widely patent. An image was obtained and stored for the medical record. Local anesthesia was attained by infiltration with 1% lidocaine. A small dermatotomy was made. Under real-time sonographic guidance, the vessel was punctured with a 21 gauge micropuncture needle. Using standard technique, the initial micro needle was exchanged over a 0.018 micro  wire for a transitional 4 [place] micro sheath. The micro sheath was then exchanged over a 0.035 wire for a 5 French vascular sheath. A C2 cobra catheter was advanced over a Bentson wire into the abdominal aorta. The catheter was used to select the left gastric artery which arises directly from the aorta. Catheter angiography was performed. Successful coil embolization of the left gastric artery on the prior angiogram. There is no significant perfusion in the region of the vascular clip. The C2 cobra catheter was then advanced into the celiac artery and a celiac arteriogram was performed. The vessels are significantly attenuated consistent with diffuse vaso spasm. Branches of the short gastric arteries are identified in the region of the previously placed endoscopic clip. Using a glidewire, the 5 [place] catheter was successfully advanced into the splenic artery. Angiography was then performed in multiple obliquities. The splenic artery divides into a superior and inferior division. Both divisions give rise to some a short gastric arteries. A Renegade high flow microcatheter was successfully advanced into the distal splenic artery and then into the superior division. Superior division short gastric arteries appear to provide blood flow more superiorly along the gastric fundus. The microcatheter was then successfully navigated into the inferior division of the splenic artery. The short gastric arteries are in close proximity to the endovascular clip. No definite active extravasation was identified. The decision was made to perform coil embolization. This will likely devascularize approximately 30-40% of the lower pole of the spleen. Coil embolization was performed using a combination of 2 and 3 mm interlock and vortex coils. Post embolization arteriography demonstrates complete cessation of flow in the arteries in the region of the previously placed endovascular clip. The catheters were removed. Hemostasis was attained  with the assistance of an ExoSeal extra arterial vascular plug. IMPRESSION: 1. Successful prior coil embolization of the left gastric artery. 2. Coil embolization of the inferior division of the splenic artery and is associated short gastric arteries in the region of the previously placed endovascular clip. Angiography and embolization therapy has now been maximized. If the patient continues to hemorrhage, repeat endoscopy or surgery would likely be required. Signed, Criselda Peaches, MD [facility] Radiology Electronically Signed   By: Jacqulynn Cadet M.D.   On: 04/13/2017 19:09   Ir Angiogram Selective Each Additional Vessel  Result Date: 04/12/2017 INDICATION: History of recent upper GI bleed, post endoscopy approximately 1 week ago resulting in placement of an endoscopy clip at the location of a bleeding gastric ulcer. Patient subsequently found to have a  lower extremity DVT and was started on anti coagulation (patient has received 2 doses of Xarelto). Unfortunately, this resulted in a recurrent large upper GI bleed. Repeat upper endoscopy demonstrates a large amount of blood products within the stomach with inability to achieve hemostasis from endoscopic approach. As such, request made for emergent mesenteric arteriogram and potential percutaneous coil embolization as well as placement of an IVC filter for temporary caval interruption purposes. EXAM: 1. ULTRASOUND GUIDANCE FOR VENOUS AND ARTERIAL ACCESS 2. SELECTIVE CELIAC ARTERIOGRAM. 3. SELECTIVE ACCESSORY LEFT GASTRIC ARTERIOGRAM. 4. SELECTIVE ARTERIOGRAM OF DISTAL TRIBUTARY OF THE LEFT GASTRIC ARTERY (3rd ORDER) AND PERCUTANEOUS COIL EMBOLIZATION 5. IVC CATHETERIZATION AND VENOGRAM 6. IVC FILTER INSERTION COMPARISON:  CTA of the abdomen pelvis - 04/03/2017; chest CT - 04/07/2017 MEDICATIONS: None. ANESTHESIA/SEDATION: Versed 58 mg IV Sedation Time: minutes; The patient was continuously monitored  during the procedure by the interventional radiology nurse under my direct supervision. CONTRAST:  75 cc Isovue 300 FLUOROSCOPY TIME:  9 Minutes 24 seconds (709 mGy) COMPLICATIONS: None immediate. PROCEDURE: Informed written consent was obtained from the patient's brother following explanation of the procedure, risks, benefits and alternatives. A time out was performed prior to the initiation of the procedure. The right groin was prepped and draped in usual sterile fashion. Maximal barrier sterile technique utilized including caps, mask, sterile gowns, sterile gloves, large sterile drape, hand hygiene, and chlorhexidine prep. The right femoral head was marked fluoroscopically. Under ultrasound guidance, the right common femoral artery was accessed with a micropuncture kit after the overlying soft tissues were anesthetized with 1% lidocaine. An ultrasound image was saved for documentation purposes. The micropuncture sheath was exchanged for a 6 [place] vascular sheath over a Bentson wire. (Note, a 6 [place] vascular sheath was selected for access given suspected hemodynamically significant stenosis within the mid aspect of the abdominal aorta and potential necessity for angioplasty.) A closure arteriogram was performed through the side of the sheath confirming access within the right common femoral artery, however the 6 [place] vascular sheath was noted to be near occlusive secondary to a combination of atherosclerotic plaque as well as the vasospasm. With the use of a Kumpe catheter, a Bentson wire was advanced to the level of the caudal aspect of the thoracic aorta. Limited abdominal aortogram was performed demonstrating patency of the abdominal aorta with the 5 French catheter in place and as such abdominal aortic angioplasty would not be necessary and the decision was made to downsized the vascular sheath a 5 [place]. Again, closure arteriogram was performed demonstrating preferential flow through the right internal  iliac artery with atretic flow surrounding the vascular sheath. Given patient's critical state the decision was made to proceed with the mesenteric arteriogram and embolization in an expeditious manner in lieu of abandoning the femoral access for a brachial approach. Over a Bentson wire, a Mickelson catheter was advanced to the level of the thoracic aorta where it was back bled and flushed. The catheter was then utilized to select the celiac artery and a selective celiac arteriogram was performed The Mickelson catheter was then utilized to select the left gastric artery and a selective left gastric arteriogram was performed. With the use of an 0.014 fathom microwire, a regular Renegade microcatheter was advanced into a distal tributary of the left gastric artery serving as the predominant arterial supply to the tangle of vessels regional to the endoscopy clip. Selective injection confirmed appropriate positioning. The distal aspect of the branch vessel was then percutaneously coil embolized with multiple overlapping  2 mm and 3 mm soft and regular interlock coils. The microcatheter was withdrawn into the central aspect of the left gastric artery and a post embolization arteriogram was performed. The microcatheter was removed and a completion left gastric arteriogram was performed via the Mickelson catheter Again, the Mickelson catheter was utilized to select the celiac artery and selective celiac arteriograms were performed in various obliquities. Images were reviewed and the arteriogram portion of the procedure was terminated. Completion closure arteriogram was performed. All wires, catheters and sheaths were removed from the patient. Hemostasis was achieved at the right groin access site with deployment of a ExoSeal closure device and manual compression. _________________________________________________________ Next, attention was made towards placement of the IVC filter. Under sterile condition and local  anesthesia, right common femoral venous access was performed with ultrasound. An ultrasound image was saved and sent to PACS. Over a guidewire, the IVC filter delivery sheath and inner dilator were advanced into the IVC just above the IVC bifurcation. Contrast injection was performed for an IVC venogram. Through the delivery sheath, a retrievable Denali IVC filter was deployed below the level of the renal veins and above the IVC bifurcation. Limited post deployment venacavagram was performed. The delivery sheath was removed and hemostasis was obtained with manual compression. A dressing was placed. The patient tolerated the procedure well without immediate post procedural complication. FINDINGS: Selective celiac arteriogram demonstrates non conventional branching pattern without supply to the left gastric artery or significant supply to the gastric fundus, as was demonstrated on preceding CTA. As such, the accessory left gastric artery arising directly from the abdominal aorta was selected with sub selective arteriogram demonstrating a tangle of blood vessels contributing arterial supply to the portion of the stomach containing the endoscopy clip. Caudal division of the accessory left gastric artery supplies the dominant arterial contribution to the portion of the stomach containing the endoscopy clip and as such, was percutaneously coil embolized with multiple overlapping 2 and 3 mm interlock coils to near the vessel's origin. Completion arteriogram demonstrates a technically excellent result with no definitive persistent arterial supply regional to the endoscopy clip. Following the percutaneous coil embolization, several additional selective arteriograms were performed in various obliquities, again confirming no definitive supply to the gastric fundus. As such, additional embolization was not performed. The IVC is patent. No evidence of thrombus, stenosis, or occlusion. No variant venous anatomy. Successful  placement of the IVC filter below the level of the renal veins. IMPRESSION: 1. Technically successful percutaneous coil embolization of the caudal division of the accessory left gastric artery serving as the predominant arterial supply to the portion of the stomach containing the endoscopy clip. 2. Selective celiac arteriogram fails to delineate significant arterial supply to the gastric fundus and in particular, to the portion of stomach regional to the endoscopy clip. 3. Successful ultrasound and fluoroscopically guided placement of an infrarenal retrievable IVC filter. PLAN: - Patient is to lie flat for 4 hours. Continued aggressive resuscitation as per the critical care team. Note, as the patient has experienced an acute large bleeding episode, I would expect the patient's H and H has not yet reached a nadir but hopefully will stabilize in the next 24-48 hours. - The IVC filter is potentially retrievable. The patient will be assessed for filter retrieval by Interventional Radiology in approximately 8-12 weeks. Further recommendations regarding filter retrieval, continued surveillance or declaration of device permanence, will be made at that time. Electronically Signed   By: Sandi Mariscal M.D.   On: 04/12/2017  07:04   Ir Angiogram Follow Up Study  Result Date: 04/13/2017 INDICATION: 62 year old female with gastric ulcer and a visible bleeding vessel. Patient underwent embolization yesterday but continues to demonstrate evidence of upper GI bleeding. Repeat angiography is requested. EXAM: IR EMBO ART VEN HEMORR LYMPH EXTRAV INC GUIDE ROADMAPPING; ARTERIOGRAPHY; IR ULTRASOUND GUIDANCE VASC ACCESS RIGHT; SELECTIVE VISCERAL ARTERIOGRAPHY; ADDITIONAL ARTERIOGRAPHY; IR RIGHT FLOURO GUIDE CV LINE 1. Ultrasound-guided access right common femoral artery 2. Catheterization and angiogram of the left gastric artery 3. Catheterization and angiogram the celiac axis 4. Catheterization and angiogram of the splenic artery 5.  Catheterization and angiogram of the superior division of the splenic artery 6. Catheterization and angiogram of the inferior division of the splenic artery 7. Coil embolization of the inferior division of the splenic artery MEDICATIONS: None ANESTHESIA/SEDATION: Moderate (conscious) sedation was employed during this procedure. A total of Versed 1 mg was administered intravenously. The patient was on a fentanyl drip. Moderate Sedation Time: 60 minutes. The patient's level of consciousness and vital signs were monitored continuously by radiology nursing throughout the procedure under my direct supervision. CONTRAST:  80 mL Isovue 300 FLUOROSCOPY TIME:  Fluoroscopy Time: 13 minutes 6 seconds (1299 mGy). COMPLICATIONS: None immediate. PROCEDURE: Informed consent was obtained from the patient following explanation of the procedure, risks, benefits and alternatives. The patient understands, agrees and consents for the procedure. All questions were addressed. A time out was performed prior to the initiation of the procedure. Maximal barrier sterile technique utilized including caps, mask, sterile gowns, sterile gloves, large sterile drape, hand hygiene, and Betadine prep. The right common femoral artery was interrogated with ultrasound and found to be widely patent. An image was obtained and stored for the medical record. Local anesthesia was attained by infiltration with 1% lidocaine. A small dermatotomy was made. Under real-time sonographic guidance, the vessel was punctured with a 21 gauge micropuncture needle. Using standard technique, the initial micro needle was exchanged over a 0.018 micro wire for a transitional 4 [place] micro sheath. The micro sheath was then exchanged over a 0.035 wire for a 5 French vascular sheath. A C2 cobra catheter was advanced over a Bentson wire into the abdominal aorta. The catheter was used to select the left gastric artery which arises directly from the aorta. Catheter angiography was  performed. Successful coil embolization of the left gastric artery on the prior angiogram. There is no significant perfusion in the region of the vascular clip. The C2 cobra catheter was then advanced into the celiac artery and a celiac arteriogram was performed. The vessels are significantly attenuated consistent with diffuse vaso spasm. Branches of the short gastric arteries are identified in the region of the previously placed endoscopic clip. Using a glidewire, the 5 [place] catheter was successfully advanced into the splenic artery. Angiography was then performed in multiple obliquities. The splenic artery divides into a superior and inferior division. Both divisions give rise to some a short gastric arteries. A Renegade high flow microcatheter was successfully advanced into the distal splenic artery and then into the superior division. Superior division short gastric arteries appear to provide blood flow more superiorly along the gastric fundus. The microcatheter was then successfully navigated into the inferior division of the splenic artery. The short gastric arteries are in close proximity to the endovascular clip. No definite active extravasation was identified. The decision was made to perform coil embolization. This will likely devascularize approximately 30-40% of the lower pole of the spleen. Coil embolization was performed using a combination  of 2 and 3 mm interlock and vortex coils. Post embolization arteriography demonstrates complete cessation of flow in the arteries in the region of the previously placed endovascular clip. The catheters were removed. Hemostasis was attained with the assistance of an ExoSeal extra arterial vascular plug. IMPRESSION: 1. Successful prior coil embolization of the left gastric artery. 2. Coil embolization of the inferior division of the splenic artery and is associated short gastric arteries in the region of the previously placed endovascular clip. Angiography and  embolization therapy has now been maximized. If the patient continues to hemorrhage, repeat endoscopy or surgery would likely be required. Signed, Criselda Peaches, MD [facility] Radiology Electronically Signed   By: Jacqulynn Cadet M.D.   On: 04/13/2017 19:09   Ir Ivc Filter Plmt / S&i /img Guid/mod Sed  Result Date: 04/12/2017 INDICATION: History of recent upper GI bleed, post endoscopy approximately 1 week ago resulting in placement of an endoscopy clip at the location of a bleeding gastric ulcer. Patient subsequently found to have a lower extremity DVT and was started on anti coagulation (patient has received 2 doses of Xarelto). Unfortunately, this resulted in a recurrent large upper GI bleed. Repeat upper endoscopy demonstrates a large amount of blood products within the stomach with inability to achieve hemostasis from endoscopic approach. As such, request made for emergent mesenteric arteriogram and potential percutaneous coil embolization as well as placement of an IVC filter for temporary caval interruption purposes. EXAM: 1. ULTRASOUND GUIDANCE FOR VENOUS AND ARTERIAL ACCESS 2. SELECTIVE CELIAC ARTERIOGRAM. 3. SELECTIVE ACCESSORY LEFT GASTRIC ARTERIOGRAM. 4. SELECTIVE ARTERIOGRAM OF DISTAL TRIBUTARY OF THE LEFT GASTRIC ARTERY (3rd ORDER) AND PERCUTANEOUS COIL EMBOLIZATION 5. IVC CATHETERIZATION AND VENOGRAM 6. IVC FILTER INSERTION COMPARISON:  CTA of the abdomen pelvis - 04/03/2017; chest CT - 04/07/2017 MEDICATIONS: None. ANESTHESIA/SEDATION: Versed 58 mg IV Sedation Time: minutes; The patient was continuously monitored during the procedure by the interventional radiology nurse under my direct supervision. CONTRAST:  75 cc Isovue 300 FLUOROSCOPY TIME:  9 Minutes 24 seconds (557 mGy) COMPLICATIONS: None immediate. PROCEDURE: Informed written consent was obtained from the patient's brother following explanation of the procedure, risks, benefits and  alternatives. A time out was performed prior to the initiation of the procedure. The right groin was prepped and draped in usual sterile fashion. Maximal barrier sterile technique utilized including caps, mask, sterile gowns, sterile gloves, large sterile drape, hand hygiene, and chlorhexidine prep. The right femoral head was marked fluoroscopically. Under ultrasound guidance, the right common femoral artery was accessed with a micropuncture kit after the overlying soft tissues were anesthetized with 1% lidocaine. An ultrasound image was saved for documentation purposes. The micropuncture sheath was exchanged for a 6 [place] vascular sheath over a Bentson wire. (Note, a 6 [place] vascular sheath was selected for access given suspected hemodynamically significant stenosis within the mid aspect of the abdominal aorta and potential necessity for angioplasty.) A closure arteriogram was performed through the side of the sheath confirming access within the right common femoral artery, however the 6 [place] vascular sheath was noted to be near occlusive secondary to a combination of atherosclerotic plaque as well as the vasospasm. With the use of a Kumpe catheter, a Bentson wire was advanced to the level of the caudal aspect of the thoracic aorta. Limited abdominal aortogram was performed demonstrating patency of the abdominal aorta with the 5 French catheter in place and as such abdominal aortic angioplasty would not be necessary and the decision was  made to downsized the vascular sheath a 5 [place]. Again, closure arteriogram was performed demonstrating preferential flow through the right internal iliac artery with atretic flow surrounding the vascular sheath. Given patient's critical state the decision was made to proceed with the mesenteric arteriogram and embolization in an expeditious manner in lieu of abandoning the femoral access for a brachial approach. Over a Bentson wire, a Mickelson catheter was advanced to the  level of the thoracic aorta where it was back bled and flushed. The catheter was then utilized to select the celiac artery and a selective celiac arteriogram was performed The Mickelson catheter was then utilized to select the left gastric artery and a selective left gastric arteriogram was performed. With the use of an 0.014 fathom microwire, a regular Renegade microcatheter was advanced into a distal tributary of the left gastric artery serving as the predominant arterial supply to the tangle of vessels regional to the endoscopy clip. Selective injection confirmed appropriate positioning. The distal aspect of the branch vessel was then percutaneously coil embolized with multiple overlapping 2 mm and 3 mm soft and regular interlock coils. The microcatheter was withdrawn into the central aspect of the left gastric artery and a post embolization arteriogram was performed. The microcatheter was removed and a completion left gastric arteriogram was performed via the Mickelson catheter Again, the Mickelson catheter was utilized to select the celiac artery and selective celiac arteriograms were performed in various obliquities. Images were reviewed and the arteriogram portion of the procedure was terminated. Completion closure arteriogram was performed. All wires, catheters and sheaths were removed from the patient. Hemostasis was achieved at the right groin access site with deployment of a ExoSeal closure device and manual compression. _________________________________________________________ Next, attention was made towards placement of the IVC filter. Under sterile condition and local anesthesia, right common femoral venous access was performed with ultrasound. An ultrasound image was saved and sent to PACS. Over a guidewire, the IVC filter delivery sheath and inner dilator were advanced into the IVC just above the IVC bifurcation. Contrast injection was performed for an IVC venogram. Through the delivery sheath, a  retrievable Denali IVC filter was deployed below the level of the renal veins and above the IVC bifurcation. Limited post deployment venacavagram was performed. The delivery sheath was removed and hemostasis was obtained with manual compression. A dressing was placed. The patient tolerated the procedure well without immediate post procedural complication. FINDINGS: Selective celiac arteriogram demonstrates non conventional branching pattern without supply to the left gastric artery or significant supply to the gastric fundus, as was demonstrated on preceding CTA. As such, the accessory left gastric artery arising directly from the abdominal aorta was selected with sub selective arteriogram demonstrating a tangle of blood vessels contributing arterial supply to the portion of the stomach containing the endoscopy clip. Caudal division of the accessory left gastric artery supplies the dominant arterial contribution to the portion of the stomach containing the endoscopy clip and as such, was percutaneously coil embolized with multiple overlapping 2 and 3 mm interlock coils to near the vessel's origin. Completion arteriogram demonstrates a technically excellent result with no definitive persistent arterial supply regional to the endoscopy clip. Following the percutaneous coil embolization, several additional selective arteriograms were performed in various obliquities, again confirming no definitive supply to the gastric fundus. As such, additional embolization was not performed. The IVC is patent. No evidence of thrombus, stenosis, or occlusion. No variant venous anatomy. Successful placement of the IVC filter below the level of the renal  veins. IMPRESSION: 1. Technically successful percutaneous coil embolization of the caudal division of the accessory left gastric artery serving as the predominant arterial supply to the portion of the stomach containing the endoscopy clip. 2. Selective celiac arteriogram fails to  delineate significant arterial supply to the gastric fundus and in particular, to the portion of stomach regional to the endoscopy clip. 3. Successful ultrasound and fluoroscopically guided placement of an infrarenal retrievable IVC filter. PLAN: - Patient is to lie flat for 4 hours. Continued aggressive resuscitation as per the critical care team. Note, as the patient has experienced an acute large bleeding episode, I would expect the patient's H and H has not yet reached a nadir but hopefully will stabilize in the next 24-48 hours. - The IVC filter is potentially retrievable. The patient will be assessed for filter retrieval by Interventional Radiology in approximately 8-12 weeks. Further recommendations regarding filter retrieval, continued surveillance or declaration of device permanence, will be made at that time. Electronically Signed   By: Sandi Mariscal M.D.   On: 04/12/2017 07:04   Ir Fluoro Guide Cv Line Right  Result Date: 04/13/2017 INDICATION: 62 year old female with gastric ulcer and a visible bleeding vessel. Patient underwent embolization yesterday but continues to demonstrate evidence of upper GI bleeding. Repeat angiography is requested. EXAM: IR EMBO ART VEN HEMORR LYMPH EXTRAV INC GUIDE ROADMAPPING; ARTERIOGRAPHY; IR ULTRASOUND GUIDANCE VASC ACCESS RIGHT; SELECTIVE VISCERAL ARTERIOGRAPHY; ADDITIONAL ARTERIOGRAPHY; IR RIGHT FLOURO GUIDE CV LINE 1. Ultrasound-guided access right common femoral artery 2. Catheterization and angiogram of the left gastric artery 3. Catheterization and angiogram the celiac axis 4. Catheterization and angiogram of the splenic artery 5. Catheterization and angiogram of the superior division of the splenic artery 6. Catheterization and angiogram of the inferior division of the splenic artery 7. Coil embolization of the inferior division of the splenic artery MEDICATIONS: None ANESTHESIA/SEDATION: Moderate (conscious) sedation was employed during this procedure. A total of  Versed 1 mg was administered intravenously. The patient was on a fentanyl drip. Moderate Sedation Time: 60 minutes. The patient's level of consciousness and vital signs were monitored continuously by radiology nursing throughout the procedure under my direct supervision. CONTRAST:  80 mL Isovue 300 FLUOROSCOPY TIME:  Fluoroscopy Time: 13 minutes 6 seconds (1299 mGy). COMPLICATIONS: None immediate. PROCEDURE: Informed consent was obtained from the patient following explanation of the procedure, risks, benefits and alternatives. The patient understands, agrees and consents for the procedure. All questions were addressed. A time out was performed prior to the initiation of the procedure. Maximal barrier sterile technique utilized including caps, mask, sterile gowns, sterile gloves, large sterile drape, hand hygiene, and Betadine prep. The right common femoral artery was interrogated with ultrasound and found to be widely patent. An image was obtained and stored for the medical record. Local anesthesia was attained by infiltration with 1% lidocaine. A small dermatotomy was made. Under real-time sonographic guidance, the vessel was punctured with a 21 gauge micropuncture needle. Using standard technique, the initial micro needle was exchanged over a 0.018 micro wire for a transitional 4 [place] micro sheath. The micro sheath was then exchanged over a 0.035 wire for a 5 French vascular sheath. A C2 cobra catheter was advanced over a Bentson wire into the abdominal aorta. The catheter was used to select the left gastric artery which arises directly from the aorta. Catheter angiography was performed. Successful coil embolization of the left gastric artery on the prior angiogram. There is no significant perfusion in the region of the vascular  clip. The C2 cobra catheter was then advanced into the celiac artery and a celiac arteriogram was performed. The vessels are significantly attenuated consistent with diffuse vaso spasm.  Branches of the short gastric arteries are identified in the region of the previously placed endoscopic clip. Using a glidewire, the 5 [place] catheter was successfully advanced into the splenic artery. Angiography was then performed in multiple obliquities. The splenic artery divides into a superior and inferior division. Both divisions give rise to some a short gastric arteries. A Renegade high flow microcatheter was successfully advanced into the distal splenic artery and then into the superior division. Superior division short gastric arteries appear to provide blood flow more superiorly along the gastric fundus. The microcatheter was then successfully navigated into the inferior division of the splenic artery. The short gastric arteries are in close proximity to the endovascular clip. No definite active extravasation was identified. The decision was made to perform coil embolization. This will likely devascularize approximately 30-40% of the lower pole of the spleen. Coil embolization was performed using a combination of 2 and 3 mm interlock and vortex coils. Post embolization arteriography demonstrates complete cessation of flow in the arteries in the region of the previously placed endovascular clip. The catheters were removed. Hemostasis was attained with the assistance of an ExoSeal extra arterial vascular plug. IMPRESSION: 1. Successful prior coil embolization of the left gastric artery. 2. Coil embolization of the inferior division of the splenic artery and is associated short gastric arteries in the region of the previously placed endovascular clip. Angiography and embolization therapy has now been maximized. If the patient continues to hemorrhage, repeat endoscopy or surgery would likely be required. Signed, Criselda Peaches, MD [facility] Radiology Electronically Signed   By: Jacqulynn Cadet M.D.   On: 04/13/2017 19:09   Ir US Guide Vasc Access  Right  Result Date: 04/13/2017 INDICATION: 62 year old female with gastric ulcer and a visible bleeding vessel. Patient underwent embolization yesterday but continues to demonstrate evidence of upper GI bleeding. Repeat angiography is requested. EXAM: IR EMBO ART VEN HEMORR LYMPH EXTRAV INC GUIDE ROADMAPPING; ARTERIOGRAPHY; IR ULTRASOUND GUIDANCE VASC ACCESS RIGHT; SELECTIVE VISCERAL ARTERIOGRAPHY; ADDITIONAL ARTERIOGRAPHY; IR RIGHT FLOURO GUIDE CV LINE 1. Ultrasound-guided access right common femoral artery 2. Catheterization and angiogram of the left gastric artery 3. Catheterization and angiogram the celiac axis 4. Catheterization and angiogram of the splenic artery 5. Catheterization and angiogram of the superior division of the splenic artery 6. Catheterization and angiogram of the inferior division of the splenic artery 7. Coil embolization of the inferior division of the splenic artery MEDICATIONS: None ANESTHESIA/SEDATION: Moderate (conscious) sedation was employed during this procedure. A total of Versed 1 mg was administered intravenously. The patient was on a fentanyl drip. Moderate Sedation Time: 60 minutes. The patient's level of consciousness and vital signs were monitored continuously by radiology nursing throughout the procedure under my direct supervision. CONTRAST:  80 mL Isovue 300 FLUOROSCOPY TIME:  Fluoroscopy Time: 13 minutes 6 seconds (1299 mGy). COMPLICATIONS: None immediate. PROCEDURE: Informed consent was obtained from the patient following explanation of the procedure, risks, benefits and alternatives. The patient understands, agrees and consents for the procedure. All questions were addressed. A time out was performed prior to the initiation of the procedure. Maximal barrier sterile technique utilized including caps, mask, sterile gowns, sterile gloves, large sterile drape, hand hygiene, and Betadine prep. The right common femoral artery was interrogated with ultrasound and found to be  widely patent.  An image was obtained and stored for the medical record. Local anesthesia was attained by infiltration with 1% lidocaine. A small dermatotomy was made. Under real-time sonographic guidance, the vessel was punctured with a 21 gauge micropuncture needle. Using standard technique, the initial micro needle was exchanged over a 0.018 micro wire for a transitional 4 [place] micro sheath. The micro sheath was then exchanged over a 0.035 wire for a 5 French vascular sheath. A C2 cobra catheter was advanced over a Bentson wire into the abdominal aorta. The catheter was used to select the left gastric artery which arises directly from the aorta. Catheter angiography was performed. Successful coil embolization of the left gastric artery on the prior angiogram. There is no significant perfusion in the region of the vascular clip. The C2 cobra catheter was then advanced into the celiac artery and a celiac arteriogram was performed. The vessels are significantly attenuated consistent with diffuse vaso spasm. Branches of the short gastric arteries are identified in the region of the previously placed endoscopic clip. Using a glidewire, the 5 [place] catheter was successfully advanced into the splenic artery. Angiography was then performed in multiple obliquities. The splenic artery divides into a superior and inferior division. Both divisions give rise to some a short gastric arteries. A Renegade high flow microcatheter was successfully advanced into the distal splenic artery and then into the superior division. Superior division short gastric arteries appear to provide blood flow more superiorly along the gastric fundus. The microcatheter was then successfully navigated into the inferior division of the splenic artery. The short gastric arteries are in close proximity to the endovascular clip. No definite active extravasation was identified. The decision was made to perform coil embolization. This will likely  devascularize approximately 30-40% of the lower pole of the spleen. Coil embolization was performed using a combination of 2 and 3 mm interlock and vortex coils. Post embolization arteriography demonstrates complete cessation of flow in the arteries in the region of the previously placed endovascular clip. The catheters were removed. Hemostasis was attained with the assistance of an ExoSeal extra arterial vascular plug. IMPRESSION: 1. Successful prior coil embolization of the left gastric artery. 2. Coil embolization of the inferior division of the splenic artery and is associated short gastric arteries in the region of the previously placed endovascular clip. Angiography and embolization therapy has now been maximized. If the patient continues to hemorrhage, repeat endoscopy or surgery would likely be required. Signed, Criselda Peaches, MD [facility] Radiology Electronically Signed   By: Jacqulynn Cadet M.D.   On: 04/13/2017 19:09   Ir US Guide Vasc Access Right  Result Date: 04/13/2017 INDICATION: 62 year old female with gastric ulcer and a visible bleeding vessel. Patient underwent embolization yesterday but continues to demonstrate evidence of upper GI bleeding. Repeat angiography is requested. EXAM: IR EMBO ART VEN HEMORR LYMPH EXTRAV INC GUIDE ROADMAPPING; ARTERIOGRAPHY; IR ULTRASOUND GUIDANCE VASC ACCESS RIGHT; SELECTIVE VISCERAL ARTERIOGRAPHY; ADDITIONAL ARTERIOGRAPHY; IR RIGHT FLOURO GUIDE CV LINE 1. Ultrasound-guided access right common femoral artery 2. Catheterization and angiogram of the left gastric artery 3. Catheterization and angiogram the celiac axis 4. Catheterization and angiogram of the splenic artery 5. Catheterization and angiogram of the superior division of the splenic artery 6. Catheterization and angiogram of the inferior division of the splenic artery 7. Coil embolization of the inferior division of the splenic artery MEDICATIONS:  None ANESTHESIA/SEDATION: Moderate (conscious) sedation was employed during this procedure. A total of Versed 1 mg was administered  intravenously. The patient was on a fentanyl drip. Moderate Sedation Time: 60 minutes. The patient's level of consciousness and vital signs were monitored continuously by radiology nursing throughout the procedure under my direct supervision. CONTRAST:  80 mL Isovue 300 FLUOROSCOPY TIME:  Fluoroscopy Time: 13 minutes 6 seconds (1299 mGy). COMPLICATIONS: None immediate. PROCEDURE: Informed consent was obtained from the patient following explanation of the procedure, risks, benefits and alternatives. The patient understands, agrees and consents for the procedure. All questions were addressed. A time out was performed prior to the initiation of the procedure. Maximal barrier sterile technique utilized including caps, mask, sterile gowns, sterile gloves, large sterile drape, hand hygiene, and Betadine prep. The right common femoral artery was interrogated with ultrasound and found to be widely patent. An image was obtained and stored for the medical record. Local anesthesia was attained by infiltration with 1% lidocaine. A small dermatotomy was made. Under real-time sonographic guidance, the vessel was punctured with a 21 gauge micropuncture needle. Using standard technique, the initial micro needle was exchanged over a 0.018 micro wire for a transitional 4 [place] micro sheath. The micro sheath was then exchanged over a 0.035 wire for a 5 French vascular sheath. A C2 cobra catheter was advanced over a Bentson wire into the abdominal aorta. The catheter was used to select the left gastric artery which arises directly from the aorta. Catheter angiography was performed. Successful coil embolization of the left gastric artery on the prior angiogram. There is no significant perfusion in the region of the vascular clip. The C2 cobra catheter was then advanced into the celiac artery and a celiac  arteriogram was performed. The vessels are significantly attenuated consistent with diffuse vaso spasm. Branches of the short gastric arteries are identified in the region of the previously placed endoscopic clip. Using a glidewire, the 5 [place] catheter was successfully advanced into the splenic artery. Angiography was then performed in multiple obliquities. The splenic artery divides into a superior and inferior division. Both divisions give rise to some a short gastric arteries. A Renegade high flow microcatheter was successfully advanced into the distal splenic artery and then into the superior division. Superior division short gastric arteries appear to provide blood flow more superiorly along the gastric fundus. The microcatheter was then successfully navigated into the inferior division of the splenic artery. The short gastric arteries are in close proximity to the endovascular clip. No definite active extravasation was identified. The decision was made to perform coil embolization. This will likely devascularize approximately 30-40% of the lower pole of the spleen. Coil embolization was performed using a combination of 2 and 3 mm interlock and vortex coils. Post embolization arteriography demonstrates complete cessation of flow in the arteries in the region of the previously placed endovascular clip. The catheters were removed. Hemostasis was attained with the assistance of an ExoSeal extra arterial vascular plug. IMPRESSION: 1. Successful prior coil embolization of the left gastric artery. 2. Coil embolization of the inferior division of the splenic artery and is associated short gastric arteries in the region of the previously placed endovascular clip. Angiography and embolization therapy has now been maximized. If the patient continues to hemorrhage, repeat endoscopy or surgery would likely be required. Signed, Criselda Peaches, MD [facility] Radiology  Electronically Signed   By: Jacqulynn Cadet M.D.   On: 04/13/2017 19:09   Ir US Guide Vasc Access Right  Result Date: 04/12/2017 INDICATION: History of recent upper GI bleed, post endoscopy approximately 1  week ago resulting in placement of an endoscopy clip at the location of a bleeding gastric ulcer. Patient subsequently found to have a lower extremity DVT and was started on anti coagulation (patient has received 2 doses of Xarelto). Unfortunately, this resulted in a recurrent large upper GI bleed. Repeat upper endoscopy demonstrates a large amount of blood products within the stomach with inability to achieve hemostasis from endoscopic approach. As such, request made for emergent mesenteric arteriogram and potential percutaneous coil embolization as well as placement of an IVC filter for temporary caval interruption purposes. EXAM: 1. ULTRASOUND GUIDANCE FOR VENOUS AND ARTERIAL ACCESS 2. SELECTIVE CELIAC ARTERIOGRAM. 3. SELECTIVE ACCESSORY LEFT GASTRIC ARTERIOGRAM. 4. SELECTIVE ARTERIOGRAM OF DISTAL TRIBUTARY OF THE LEFT GASTRIC ARTERY (3rd ORDER) AND PERCUTANEOUS COIL EMBOLIZATION 5. IVC CATHETERIZATION AND VENOGRAM 6. IVC FILTER INSERTION COMPARISON:  CTA of the abdomen pelvis - 04/03/2017; chest CT - 04/07/2017 MEDICATIONS: None. ANESTHESIA/SEDATION: Versed 58 mg IV Sedation Time: minutes; The patient was continuously monitored during the procedure by the interventional radiology nurse under my direct supervision. CONTRAST:  75 cc Isovue 300 FLUOROSCOPY TIME:  9 Minutes 24 seconds (829 mGy) COMPLICATIONS: None immediate. PROCEDURE: Informed written consent was obtained from the patient's brother following explanation of the procedure, risks, benefits and alternatives. A time out was performed prior to the initiation of the procedure. The right groin was prepped and draped in usual sterile fashion. Maximal barrier sterile technique utilized including caps, mask, sterile gowns, sterile gloves, large sterile  drape, hand hygiene, and chlorhexidine prep. The right femoral head was marked fluoroscopically. Under ultrasound guidance, the right common femoral artery was accessed with a micropuncture kit after the overlying soft tissues were anesthetized with 1% lidocaine. An ultrasound image was saved for documentation purposes. The micropuncture sheath was exchanged for a 6 [place] vascular sheath over a Bentson wire. (Note, a 6 [place] vascular sheath was selected for access given suspected hemodynamically significant stenosis within the mid aspect of the abdominal aorta and potential necessity for angioplasty.) A closure arteriogram was performed through the side of the sheath confirming access within the right common femoral artery, however the 6 [place] vascular sheath was noted to be near occlusive secondary to a combination of atherosclerotic plaque as well as the vasospasm. With the use of a Kumpe catheter, a Bentson wire was advanced to the level of the caudal aspect of the thoracic aorta. Limited abdominal aortogram was performed demonstrating patency of the abdominal aorta with the 5 French catheter in place and as such abdominal aortic angioplasty would not be necessary and the decision was made to downsized the vascular sheath a 5 [place]. Again, closure arteriogram was performed demonstrating preferential flow through the right internal iliac artery with atretic flow surrounding the vascular sheath. Given patient's critical state the decision was made to proceed with the mesenteric arteriogram and embolization in an expeditious manner in lieu of abandoning the femoral access for a brachial approach. Over a Bentson wire, a Mickelson catheter was advanced to the level of the thoracic aorta where it was back bled and flushed. The catheter was then utilized to select the celiac artery and a selective celiac arteriogram was performed The Mickelson catheter was then utilized to select the left gastric artery and a  selective left gastric arteriogram was performed. With the use of an 0.014 fathom microwire, a regular Renegade microcatheter was advanced into a distal tributary of the left gastric artery serving as the predominant arterial supply to the tangle of vessels regional to  the endoscopy clip. Selective injection confirmed appropriate positioning. The distal aspect of the branch vessel was then percutaneously coil embolized with multiple overlapping 2 mm and 3 mm soft and regular interlock coils. The microcatheter was withdrawn into the central aspect of the left gastric artery and a post embolization arteriogram was performed. The microcatheter was removed and a completion left gastric arteriogram was performed via the Mickelson catheter Again, the Mickelson catheter was utilized to select the celiac artery and selective celiac arteriograms were performed in various obliquities. Images were reviewed and the arteriogram portion of the procedure was terminated. Completion closure arteriogram was performed. All wires, catheters and sheaths were removed from the patient. Hemostasis was achieved at the right groin access site with deployment of a ExoSeal closure device and manual compression. _________________________________________________________ Next, attention was made towards placement of the IVC filter. Under sterile condition and local anesthesia, right common femoral venous access was performed with ultrasound. An ultrasound image was saved and sent to PACS. Over a guidewire, the IVC filter delivery sheath and inner dilator were advanced into the IVC just above the IVC bifurcation. Contrast injection was performed for an IVC venogram. Through the delivery sheath, a retrievable Denali IVC filter was deployed below the level of the renal veins and above the IVC bifurcation. Limited post deployment venacavagram was performed. The delivery sheath was removed and hemostasis was obtained with manual compression. A  dressing was placed. The patient tolerated the procedure well without immediate post procedural complication. FINDINGS: Selective celiac arteriogram demonstrates non conventional branching pattern without supply to the left gastric artery or significant supply to the gastric fundus, as was demonstrated on preceding CTA. As such, the accessory left gastric artery arising directly from the abdominal aorta was selected with sub selective arteriogram demonstrating a tangle of blood vessels contributing arterial supply to the portion of the stomach containing the endoscopy clip. Caudal division of the accessory left gastric artery supplies the dominant arterial contribution to the portion of the stomach containing the endoscopy clip and as such, was percutaneously coil embolized with multiple overlapping 2 and 3 mm interlock coils to near the vessel's origin. Completion arteriogram demonstrates a technically excellent result with no definitive persistent arterial supply regional to the endoscopy clip. Following the percutaneous coil embolization, several additional selective arteriograms were performed in various obliquities, again confirming no definitive supply to the gastric fundus. As such, additional embolization was not performed. The IVC is patent. No evidence of thrombus, stenosis, or occlusion. No variant venous anatomy. Successful placement of the IVC filter below the level of the renal veins. IMPRESSION: 1. Technically successful percutaneous coil embolization of the caudal division of the accessory left gastric artery serving as the predominant arterial supply to the portion of the stomach containing the endoscopy clip. 2. Selective celiac arteriogram fails to delineate significant arterial supply to the gastric fundus and in particular, to the portion of stomach regional to the endoscopy clip. 3. Successful ultrasound and fluoroscopically guided placement of an infrarenal retrievable IVC filter. PLAN: -  Patient is to lie flat for 4 hours. Continued aggressive resuscitation as per the critical care team. Note, as the patient has experienced an acute large bleeding episode, I would expect the patient's H and H has not yet reached a nadir but hopefully will stabilize in the next 24-48 hours. - The IVC filter is potentially retrievable. The patient will be assessed for filter retrieval by Interventional Radiology in approximately 8-12 weeks. Further recommendations regarding filter retrieval, continued surveillance or  declaration of device permanence, will be made at that time. Electronically Signed   By: Sandi Mariscal M.D.   On: 04/12/2017 07:04   Dg Chest Port 1 View  Result Date: 04/13/2017 CLINICAL DATA:  Respiratory failure EXAM: PORTABLE CHEST 1 VIEW COMPARISON:  Portable chest x-ray of April 12, 2017 FINDINGS: The lungs are reasonably well inflated. There is persistent increased density at both lung bases greatest on the left. The heart is normal in size. The pulmonary vascularity is less engorged today. The endotracheal tube tip projects 4.5 cm above the carina. The left internal jugular venous catheter tip projects at the junction of the right and left brachiocephalic veins. IMPRESSION: Decreased pulmonary edema. Persistent bibasilar subsegmental atelectasis or pneumonia greatest on the left. Electronically Signed   By: David  Martinique M.D.   On: 04/13/2017 07:00   Dg Chest Port 1 View  Result Date: 04/12/2017 CLINICAL DATA:  Central line placement EXAM: PORTABLE CHEST 1 VIEW COMPARISON:  04/12/2017 FINDINGS: Endotracheal tube tip measures 4.7 cm above the carina. A left jugular central venous catheter has been placed. Tip is over the mid mediastinum consistent with location in the brachiocephalic vein. No pneumothorax. Mild cardiac enlargement. Developing pulmonary vascular congestion with developing perihilar infiltration suggesting edema. No blunting of costophrenic angles. IMPRESSION: Left central  venous catheter tip projects over the brachiocephalic vein. No pneumothorax. Developing cardiac enlargement with perihilar edema. Electronically Signed   By: Lucienne Capers M.D.   On: 04/12/2017 04:05   Dg Chest Port 1 View  Result Date: 04/12/2017 CLINICAL DATA:  Intubation EXAM: PORTABLE CHEST 1 VIEW COMPARISON:  CT chest 04/07/2017.  Chest 04/03/2017 FINDINGS: Shallow inspiration. Heart size and pulmonary vascularity are normal. 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 An endotracheal tube is been placed with tip measuring 4.8 cm above the carina. Lungs are clear. No blunting of costophrenic angles. No pneumothorax. IMPRESSION: Endotracheal tube placed with tip measuring 4.8 cm above the carina. Lungs are clear. Electronically Signed   By: Lucienne Capers M.D.   On: 04/12/2017 01:22   Dg Abd Portable 1v  Result Date: 04/13/2017 CLINICAL DATA:  62 year old female with nasogastric tube placement. Subsequent encounter. EXAM: PORTABLE ABDOMEN - 1 VIEW COMPARISON:  04/13/2017 CT FINDINGS: Nasogastric tube tip pylorus level with side hole body/antrum junction. Residual contrast within dilated left renal collecting system. Abnormal bowel gas pattern.  Please see CT report. IMPRESSION: Nasogastric tube tip gastric pylorus level with side hole gastric body/antrum junction. Electronically Signed   By: Genia Del M.D.   On: 04/13/2017 15:36   [place] Guide Roadmapping  Result Date: 04/13/2017 INDICATION: 62 year old female with gastric ulcer and a visible bleeding vessel. Patient underwent embolization yesterday but continues to demonstrate evidence of upper GI bleeding. Repeat angiography is requested. EXAM: IR EMBO ART VEN HEMORR LYMPH EXTRAV INC GUIDE ROADMAPPING; ARTERIOGRAPHY; IR ULTRASOUND GUIDANCE VASC ACCESS RIGHT; SELECTIVE VISCERAL ARTERIOGRAPHY; ADDITIONAL ARTERIOGRAPHY; IR RIGHT FLOURO GUIDE CV LINE 1. Ultrasound-guided  access right common femoral artery 2. Catheterization and angiogram of the left gastric artery 3. Catheterization and angiogram the celiac axis 4. Catheterization and angiogram of the splenic artery 5. Catheterization and angiogram of the superior division of the splenic artery 6. Catheterization and angiogram of the inferior division of the splenic artery 7. Coil embolization of the inferior division of the splenic artery MEDICATIONS: None ANESTHESIA/SEDATION: Moderate (conscious) sedation was employed during this procedure. A total of Versed 1  mg was administered intravenously. The patient was on a fentanyl drip. Moderate Sedation Time: 60 minutes. The patient's level of consciousness and vital signs were monitored continuously by radiology nursing throughout the procedure under my direct supervision. CONTRAST:  80 mL Isovue 300 FLUOROSCOPY TIME:  Fluoroscopy Time: 13 minutes 6 seconds (1299 mGy). COMPLICATIONS: None immediate. PROCEDURE: Informed consent was obtained from the patient following explanation of the procedure, risks, benefits and alternatives. The patient understands, agrees and consents for the procedure. All questions were addressed. A time out was performed prior to the initiation of the procedure. Maximal barrier sterile technique utilized including caps, mask, sterile gowns, sterile gloves, large sterile drape, hand hygiene, and Betadine prep. The right common femoral artery was interrogated with ultrasound and found to be widely patent. An image was obtained and stored for the medical record. Local anesthesia was attained by infiltration with 1% lidocaine. A small dermatotomy was made. Under real-time sonographic guidance, the vessel was punctured with a 21 gauge micropuncture needle. Using standard technique, the initial micro needle was exchanged over a 0.018 micro wire for a transitional 4 [place] micro sheath. The micro sheath was then exchanged over a 0.035 wire for a 5 French vascular  sheath. A C2 cobra catheter was advanced over a Bentson wire into the abdominal aorta. The catheter was used to select the left gastric artery which arises directly from the aorta. Catheter angiography was performed. Successful coil embolization of the left gastric artery on the prior angiogram. There is no significant perfusion in the region of the vascular clip. The C2 cobra catheter was then advanced into the celiac artery and a celiac arteriogram was performed. The vessels are significantly attenuated consistent with diffuse vaso spasm. Branches of the short gastric arteries are identified in the region of the previously placed endoscopic clip. Using a glidewire, the 5 [place] catheter was successfully advanced into the splenic artery. Angiography was then performed in multiple obliquities. The splenic artery divides into a superior and inferior division. Both divisions give rise to some a short gastric arteries. A Renegade high flow microcatheter was successfully advanced into the distal splenic artery and then into the superior division. Superior division short gastric arteries appear to provide blood flow more superiorly along the gastric fundus. The microcatheter was then successfully navigated into the inferior division of the splenic artery. The short gastric arteries are in close proximity to the endovascular clip. No definite active extravasation was identified. The decision was made to perform coil embolization. This will likely devascularize approximately 30-40% of the lower pole of the spleen. Coil embolization was performed using a combination of 2 and 3 mm interlock and vortex coils. Post embolization arteriography demonstrates complete cessation of flow in the arteries in the region of the previously placed endovascular clip. The catheters were removed. Hemostasis was attained with the assistance of an ExoSeal extra arterial vascular plug. IMPRESSION: 1. Successful prior coil embolization of the  left gastric artery. 2. Coil embolization of the inferior division of the splenic artery and is associated short gastric arteries in the region of the previously placed endovascular clip. Angiography and embolization therapy has now been maximized. If the patient continues to hemorrhage, repeat endoscopy or surgery would likely be required. Signed, Criselda Peaches, MD [facility] Electronically Signed   By: Jacqulynn Cadet M.D.   On: 04/13/2017 19:09   Ir Embo Art  [place] Guide Roadmapping  Result Date: 04/12/2017 INDICATION: History  of recent upper GI bleed, post endoscopy approximately 1 week ago resulting in placement of an endoscopy clip at the location of a bleeding gastric ulcer. Patient subsequently found to have a lower extremity DVT and was started on anti coagulation (patient has received 2 doses of Xarelto). Unfortunately, this resulted in a recurrent large upper GI bleed. Repeat upper endoscopy demonstrates a large amount of blood products within the stomach with inability to achieve hemostasis from endoscopic approach. As such, request made for emergent mesenteric arteriogram and potential percutaneous coil embolization as well as placement of an IVC filter for temporary caval interruption purposes. EXAM: 1. ULTRASOUND GUIDANCE FOR VENOUS AND ARTERIAL ACCESS 2. SELECTIVE CELIAC ARTERIOGRAM. 3. SELECTIVE ACCESSORY LEFT GASTRIC ARTERIOGRAM. 4. SELECTIVE ARTERIOGRAM OF DISTAL TRIBUTARY OF THE LEFT GASTRIC ARTERY (3rd ORDER) AND PERCUTANEOUS COIL EMBOLIZATION 5. IVC CATHETERIZATION AND VENOGRAM 6. IVC FILTER INSERTION COMPARISON:  CTA of the abdomen pelvis - 04/03/2017; chest CT - 04/07/2017 MEDICATIONS: None. ANESTHESIA/SEDATION: Versed 58 mg IV Sedation Time: minutes; The patient was continuously monitored during the procedure by the interventional radiology nurse under my direct supervision. CONTRAST:  75 cc Isovue  300 FLUOROSCOPY TIME:  9 Minutes 24 seconds (790 mGy) COMPLICATIONS: None immediate. PROCEDURE: Informed written consent was obtained from the patient's brother following explanation of the procedure, risks, benefits and alternatives. A time out was performed prior to the initiation of the procedure. The right groin was prepped and draped in usual sterile fashion. Maximal barrier sterile technique utilized including caps, mask, sterile gowns, sterile gloves, large sterile drape, hand hygiene, and chlorhexidine prep. The right femoral head was marked fluoroscopically. Under ultrasound guidance, the right common femoral artery was accessed with a micropuncture kit after the overlying soft tissues were anesthetized with 1% lidocaine. An ultrasound image was saved for documentation purposes. The micropuncture sheath was exchanged for a 6 [place] vascular sheath over a Bentson wire. (Note, a 6 [place] vascular sheath was selected for access given suspected hemodynamically significant stenosis within the mid aspect of the abdominal aorta and potential necessity for angioplasty.) A closure arteriogram was performed through the side of the sheath confirming access within the right common femoral artery, however the 6 [place] vascular sheath was noted to be near occlusive secondary to a combination of atherosclerotic plaque as well as the vasospasm. With the use of a Kumpe catheter, a Bentson wire was advanced to the level of the caudal aspect of the thoracic aorta. Limited abdominal aortogram was performed demonstrating patency of the abdominal aorta with the 5 French catheter in place and as such abdominal aortic angioplasty would not be necessary and the decision was made to downsized the vascular sheath a 5 [place]. Again, closure arteriogram was performed demonstrating preferential flow through the right internal iliac artery with atretic flow surrounding the vascular sheath. Given patient's critical state the decision was  made to proceed with the mesenteric arteriogram and embolization in an expeditious manner in lieu of abandoning the femoral access for a brachial approach. Over a Bentson wire, a Mickelson catheter was advanced to the level of the thoracic aorta where it was back bled and flushed. The catheter was then utilized to select the celiac artery and a selective celiac arteriogram was performed The Mickelson catheter was then utilized to select the left gastric artery and a selective left gastric arteriogram was performed. With the use of an 0.014 fathom microwire, a regular Renegade microcatheter was advanced into a distal tributary of the left gastric artery serving as the predominant  arterial supply to the tangle of vessels regional to the endoscopy clip. Selective injection confirmed appropriate positioning. The distal aspect of the branch vessel was then percutaneously coil embolized with multiple overlapping 2 mm and 3 mm soft and regular interlock coils. The microcatheter was withdrawn into the central aspect of the left gastric artery and a post embolization arteriogram was performed. The microcatheter was removed and a completion left gastric arteriogram was performed via the Mickelson catheter Again, the Mickelson catheter was utilized to select the celiac artery and selective celiac arteriograms were performed in various obliquities. Images were reviewed and the arteriogram portion of the procedure was terminated. Completion closure arteriogram was performed. All wires, catheters and sheaths were removed from the patient. Hemostasis was achieved at the right groin access site with deployment of a ExoSeal closure device and manual compression. _________________________________________________________ Next, attention was made towards placement of the IVC filter. Under sterile condition and local anesthesia, right common femoral venous access was performed with ultrasound. An ultrasound image was saved and sent to  PACS. Over a guidewire, the IVC filter delivery sheath and inner dilator were advanced into the IVC just above the IVC bifurcation. Contrast injection was performed for an IVC venogram. Through the delivery sheath, a retrievable Denali IVC filter was deployed below the level of the renal veins and above the IVC bifurcation. Limited post deployment venacavagram was performed. The delivery sheath was removed and hemostasis was obtained with manual compression. A dressing was placed. The patient tolerated the procedure well without immediate post procedural complication. FINDINGS: Selective celiac arteriogram demonstrates non conventional branching pattern without supply to the left gastric artery or significant supply to the gastric fundus, as was demonstrated on preceding CTA. As such, the accessory left gastric artery arising directly from the abdominal aorta was selected with sub selective arteriogram demonstrating a tangle of blood vessels contributing arterial supply to the portion of the stomach containing the endoscopy clip. Caudal division of the accessory left gastric artery supplies the dominant arterial contribution to the portion of the stomach containing the endoscopy clip and as such, was percutaneously coil embolized with multiple overlapping 2 and 3 mm interlock coils to near the vessel's origin. Completion arteriogram demonstrates a technically excellent result with no definitive persistent arterial supply regional to the endoscopy clip. Following the percutaneous coil embolization, several additional selective arteriograms were performed in various obliquities, again confirming no definitive supply to the gastric fundus. As such, additional embolization was not performed. The IVC is patent. No evidence of thrombus, stenosis, or occlusion. No variant venous anatomy. Successful placement of the IVC filter below the level of the renal veins. IMPRESSION: 1. Technically successful percutaneous coil  embolization of the caudal division of the accessory left gastric artery serving as the predominant arterial supply to the portion of the stomach containing the endoscopy clip. 2. Selective celiac arteriogram fails to delineate significant arterial supply to the gastric fundus and in particular, to the portion of stomach regional to the endoscopy clip. 3. Successful ultrasound and fluoroscopically guided placement of an infrarenal retrievable IVC filter. PLAN: - Patient is to lie flat for 4 hours. Continued aggressive resuscitation as per the critical care team. Note, as the patient has experienced an acute large bleeding episode, I would expect the patient's H and H has not yet reached a nadir but hopefully will stabilize in the next 24-48 hours. - The IVC filter is potentially retrievable. The patient will be assessed for filter retrieval by Interventional Radiology in approximately 8-12  weeks. Further recommendations regarding filter retrieval, continued surveillance or declaration of device permanence, will be made at that time. Electronically Signed   By: Sandi Mariscal M.D.   On: 04/12/2017 07:04    Review of Systems  Unable to perform ROS: Intubated   Blood pressure (!) 124/49, pulse 79, temperature 99.2 F (37.3 C), temperature source Axillary, resp. rate 19, height '5\' 6"'$  (1.676 m), weight 84.3 kg (185 lb 13.6 oz), SpO2 100 %. Physical Exam  Constitutional: She appears ill. She is intubated.  HENT:  Head: Normocephalic and atraumatic.  Eyes: No scleral icterus.  Neck: Neck supple.  Cardiovascular: Regular rhythm. Tachycardia present.  Respiratory: Breath sounds normal. She is intubated.  GI: Soft. There is no tenderness.  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 I think in conversation with Dr Laurence Ferrari they have done  everything they can and hopefully she remains without further bleeding.  We will follow. If she continues to bleed at this pace then she will need to go to OR. If she drops hct some and is otherwise stable I also think another attempt at an egd might be reasonable as well if volume of blood loss is less.    Rolm Bookbinder 04/13/2017, 7:19 PM

## 2017-04-13 NOTE — Progress Notes (Signed)
Subjective: Weekend events noted.  Objective: Vital signs in last 24 hours: Temp:  [97.7 F (36.5 C)-99.2 F (37.3 C)] 99.2 F (37.3 C) (02/04 0646) Pulse Rate:  [35-139] 112 (02/04 0700) Resp:  [5-26] 20 (02/04 0700) BP: (46-129)/(35-83) 113/62 (02/04 0700) SpO2:  [95 %-100 %] 100 % (02/04 0700) Arterial Line BP: (43-216)/(33-211) 127/44 (02/04 0700) FiO2 (%):  [40 %] 40 % (02/04 0335) Weight:  [84.3 kg (185 lb 13.6 oz)] 84.3 kg (185 lb 13.6 oz) (02/04 0646) Last BM Date: 04/11/17  Intake/Output from previous day: 02/03 0701 - 02/04 0700 In: 6082 [I.V.:4797; Blood:1285] Out: 1170 [Urine:1170] Intake/Output this shift: No intake/output data recorded.  General appearance: arousable, intubated GI: soft, non-tender; bowel sounds normal; no masses,  no organomegaly  Lab Results: Recent Labs    04/11/17 0328  04/12/17 0412  04/12/17 2000 04/12/17 2339 04/13/17 0346  WBC 8.8  --  35.2*  --   --   --  23.5*  HGB 7.8*   < > 9.6*   < > 9.2* 7.8* 4.5*  HCT 24.0*   < > 28.3*   < > 25.8* 22.0* 13.0*  PLT 176  --  72*  --   --   --  66*   < > = values in this interval not displayed.   BMET Recent Labs    04/12/17 1107 04/12/17 2000 04/13/17 0346  NA 146* 148* 145  K 3.3* 3.0* 3.9  CL 106 113* 105  CO2 '26 28 28  '$ GLUCOSE 166* 107* 223*  BUN '11 9 12  '$ CREATININE 1.15* 1.09* 1.46*  CALCIUM 6.7* 6.0* 6.4*   LFT No results for input(s): PROT, ALBUMIN, AST, ALT, ALKPHOS, BILITOT, BILIDIR, IBILI in the last 72 hours. PT/INR No results for input(s): LABPROT, INR in the last 72 hours. Hepatitis Panel No results for input(s): HEPBSAG, HCVAB, HEPAIGM, HEPBIGM in the last 72 hours. C-Diff No results for input(s): CDIFFTOX in the last 72 hours. Fecal Lactopherrin No results for input(s): FECLLACTOFRN in the last 72 hours.  Studies/Results: Ir Angiogram Visceral Selective  Result Date: 04/12/2017 INDICATION: History of recent upper GI bleed, post endoscopy approximately 1  week ago resulting in placement of an endoscopy clip at the location of a bleeding gastric ulcer. Patient subsequently found to have a lower extremity DVT and was started on anti coagulation (patient has received 2 doses of Xarelto). Unfortunately, this resulted in a recurrent large upper GI bleed. Repeat upper endoscopy demonstrates a large amount of blood products within the stomach with inability to achieve hemostasis from endoscopic approach. As such, request made for emergent mesenteric arteriogram and potential percutaneous coil embolization as well as placement of an IVC filter for temporary caval interruption purposes. EXAM: 1. ULTRASOUND GUIDANCE FOR VENOUS AND ARTERIAL ACCESS 2. SELECTIVE CELIAC ARTERIOGRAM. 3. SELECTIVE ACCESSORY LEFT GASTRIC ARTERIOGRAM. 4. SELECTIVE ARTERIOGRAM OF DISTAL TRIBUTARY OF THE LEFT GASTRIC ARTERY (3rd ORDER) AND PERCUTANEOUS COIL EMBOLIZATION 5. IVC CATHETERIZATION AND VENOGRAM 6. IVC FILTER INSERTION COMPARISON:  CTA of the abdomen pelvis - 04/03/2017; chest CT - 04/07/2017 MEDICATIONS: None. ANESTHESIA/SEDATION: Versed 58 mg IV Sedation Time: minutes; The patient was continuously monitored during the procedure by the interventional radiology nurse under my direct supervision. CONTRAST:  75 cc Isovue 300 FLUOROSCOPY TIME:  9 Minutes 24 seconds (947 mGy) COMPLICATIONS: None immediate. PROCEDURE: Informed written consent was obtained from the patient's brother following explanation of the procedure, risks, benefits and alternatives. A time out was performed prior to the initiation of the  procedure. The right groin was prepped and draped in usual sterile fashion. Maximal barrier sterile technique utilized including caps, mask, sterile gowns, sterile gloves, large sterile drape, hand hygiene, and chlorhexidine prep. The right femoral head was marked fluoroscopically. Under ultrasound guidance, the right common femoral artery was accessed with a micropuncture kit after the  overlying soft tissues were anesthetized with 1% lidocaine. An ultrasound image was saved for documentation purposes. The micropuncture sheath was exchanged for a 6 [place] vascular sheath over a Bentson wire. (Note, a 6 [place] vascular sheath was selected for access given suspected hemodynamically significant stenosis within the mid aspect of the abdominal aorta and potential necessity for angioplasty.) A closure arteriogram was performed through the side of the sheath confirming access within the right common femoral artery, however the 6 [place] vascular sheath was noted to be near occlusive secondary to a combination of atherosclerotic plaque as well as the vasospasm. With the use of a Kumpe catheter, a Bentson wire was advanced to the level of the caudal aspect of the thoracic aorta. Limited abdominal aortogram was performed demonstrating patency of the abdominal aorta with the 5 French catheter in place and as such abdominal aortic angioplasty would not be necessary and the decision was made to downsized the vascular sheath a 5 [place]. Again, closure arteriogram was performed demonstrating preferential flow through the right internal iliac artery with atretic flow surrounding the vascular sheath. Given patient's critical state the decision was made to proceed with the mesenteric arteriogram and embolization in an expeditious manner in lieu of abandoning the femoral access for a brachial approach. Over a Bentson wire, a Mickelson catheter was advanced to the level of the thoracic aorta where it was back bled and flushed. The catheter was then utilized to select the celiac artery and a selective celiac arteriogram was performed The Mickelson catheter was then utilized to select the left gastric artery and a selective left gastric arteriogram was performed. With the use of an 0.014 fathom microwire, a regular Renegade microcatheter was advanced into a distal tributary of the left gastric artery serving as the  predominant arterial supply to the tangle of vessels regional to the endoscopy clip. Selective injection confirmed appropriate positioning. The distal aspect of the branch vessel was then percutaneously coil embolized with multiple overlapping 2 mm and 3 mm soft and regular interlock coils. The microcatheter was withdrawn into the central aspect of the left gastric artery and a post embolization arteriogram was performed. The microcatheter was removed and a completion left gastric arteriogram was performed via the Mickelson catheter Again, the Mickelson catheter was utilized to select the celiac artery and selective celiac arteriograms were performed in various obliquities. Images were reviewed and the arteriogram portion of the procedure was terminated. Completion closure arteriogram was performed. All wires, catheters and sheaths were removed from the patient. Hemostasis was achieved at the right groin access site with deployment of a ExoSeal closure device and manual compression. _________________________________________________________ Next, attention was made towards placement of the IVC filter. Under sterile condition and local anesthesia, right common femoral venous access was performed with ultrasound. An ultrasound image was saved and sent to PACS. Over a guidewire, the IVC filter delivery sheath and inner dilator were advanced into the IVC just above the IVC bifurcation. Contrast injection was performed for an IVC venogram. Through the delivery sheath, a retrievable Denali IVC filter was deployed below the level of the renal veins and above the IVC bifurcation. Limited post deployment venacavagram was performed. The  delivery sheath was removed and hemostasis was obtained with manual compression. A dressing was placed. The patient tolerated the procedure well without immediate post procedural complication. FINDINGS: Selective celiac arteriogram demonstrates non conventional branching pattern without supply  to the left gastric artery or significant supply to the gastric fundus, as was demonstrated on preceding CTA. As such, the accessory left gastric artery arising directly from the abdominal aorta was selected with sub selective arteriogram demonstrating a tangle of blood vessels contributing arterial supply to the portion of the stomach containing the endoscopy clip. Caudal division of the accessory left gastric artery supplies the dominant arterial contribution to the portion of the stomach containing the endoscopy clip and as such, was percutaneously coil embolized with multiple overlapping 2 and 3 mm interlock coils to near the vessel's origin. Completion arteriogram demonstrates a technically excellent result with no definitive persistent arterial supply regional to the endoscopy clip. Following the percutaneous coil embolization, several additional selective arteriograms were performed in various obliquities, again confirming no definitive supply to the gastric fundus. As such, additional embolization was not performed. The IVC is patent. No evidence of thrombus, stenosis, or occlusion. No variant venous anatomy. Successful placement of the IVC filter below the level of the renal veins. IMPRESSION: 1. Technically successful percutaneous coil embolization of the caudal division of the accessory left gastric artery serving as the predominant arterial supply to the portion of the stomach containing the endoscopy clip. 2. Selective celiac arteriogram fails to delineate significant arterial supply to the gastric fundus and in particular, to the portion of stomach regional to the endoscopy clip. 3. Successful ultrasound and fluoroscopically guided placement of an infrarenal retrievable IVC filter. PLAN: - Patient is to lie flat for 4 hours. Continued aggressive resuscitation as per the critical care team. Note, as the patient has experienced an acute large bleeding episode, I would expect the patient's H and H has not  yet reached a nadir but hopefully will stabilize in the next 24-48 hours. - The IVC filter is potentially retrievable. The patient will be assessed for filter retrieval by Interventional Radiology in approximately 8-12 weeks. Further recommendations regarding filter retrieval, continued surveillance or declaration of device permanence, will be made at that time. Electronically Signed   By: Sandi Mariscal M.D.   On: 04/12/2017 07:04   Ir Angiogram Selective Each Additional Vessel  Result Date: 04/12/2017 INDICATION: History of recent upper GI bleed, post endoscopy approximately 1 week ago resulting in placement of an endoscopy clip at the location of a bleeding gastric ulcer. Patient subsequently found to have a lower extremity DVT and was started on anti coagulation (patient has received 2 doses of Xarelto). Unfortunately, this resulted in a recurrent large upper GI bleed. Repeat upper endoscopy demonstrates a large amount of blood products within the stomach with inability to achieve hemostasis from endoscopic approach. As such, request made for emergent mesenteric arteriogram and potential percutaneous coil embolization as well as placement of an IVC filter for temporary caval interruption purposes. EXAM: 1. ULTRASOUND GUIDANCE FOR VENOUS AND ARTERIAL ACCESS 2. SELECTIVE CELIAC ARTERIOGRAM. 3. SELECTIVE ACCESSORY LEFT GASTRIC ARTERIOGRAM. 4. SELECTIVE ARTERIOGRAM OF DISTAL TRIBUTARY OF THE LEFT GASTRIC ARTERY (3rd ORDER) AND PERCUTANEOUS COIL EMBOLIZATION 5. IVC CATHETERIZATION AND VENOGRAM 6. IVC FILTER INSERTION COMPARISON:  CTA of the abdomen pelvis - 04/03/2017; chest CT - 04/07/2017 MEDICATIONS: None. ANESTHESIA/SEDATION: Versed 58 mg IV Sedation Time: minutes; The patient was continuously monitored during the procedure by the interventional radiology nurse under my direct supervision. CONTRAST:  75 cc Isovue 300 FLUOROSCOPY TIME:  9 Minutes 24 seconds (846 mGy) COMPLICATIONS: None immediate. PROCEDURE:  Informed written consent was obtained from the patient's brother following explanation of the procedure, risks, benefits and alternatives. A time out was performed prior to the initiation of the procedure. The right groin was prepped and draped in usual sterile fashion. Maximal barrier sterile technique utilized including caps, mask, sterile gowns, sterile gloves, large sterile drape, hand hygiene, and chlorhexidine prep. The right femoral head was marked fluoroscopically. Under ultrasound guidance, the right common femoral artery was accessed with a micropuncture kit after the overlying soft tissues were anesthetized with 1% lidocaine. An ultrasound image was saved for documentation purposes. The micropuncture sheath was exchanged for a 6 [place] vascular sheath over a Bentson wire. (Note, a 6 [place] vascular sheath was selected for access given suspected hemodynamically significant stenosis within the mid aspect of the abdominal aorta and potential necessity for angioplasty.) A closure arteriogram was performed through the side of the sheath confirming access within the right common femoral artery, however the 6 [place] vascular sheath was noted to be near occlusive secondary to a combination of atherosclerotic plaque as well as the vasospasm. With the use of a Kumpe catheter, a Bentson wire was advanced to the level of the caudal aspect of the thoracic aorta. Limited abdominal aortogram was performed demonstrating patency of the abdominal aorta with the 5 French catheter in place and as such abdominal aortic angioplasty would not be necessary and the decision was made to downsized the vascular sheath a 5 [place]. Again, closure arteriogram was performed demonstrating preferential flow through the right internal iliac artery with atretic flow surrounding the vascular sheath. Given patient's critical state the decision was made to proceed with the mesenteric arteriogram and embolization in an expeditious manner in  lieu of abandoning the femoral access for a brachial approach. Over a Bentson wire, a Mickelson catheter was advanced to the level of the thoracic aorta where it was back bled and flushed. The catheter was then utilized to select the celiac artery and a selective celiac arteriogram was performed The Mickelson catheter was then utilized to select the left gastric artery and a selective left gastric arteriogram was performed. With the use of an 0.014 fathom microwire, a regular Renegade microcatheter was advanced into a distal tributary of the left gastric artery serving as the predominant arterial supply to the tangle of vessels regional to the endoscopy clip. Selective injection confirmed appropriate positioning. The distal aspect of the branch vessel was then percutaneously coil embolized with multiple overlapping 2 mm and 3 mm soft and regular interlock coils. The microcatheter was withdrawn into the central aspect of the left gastric artery and a post embolization arteriogram was performed. The microcatheter was removed and a completion left gastric arteriogram was performed via the Mickelson catheter Again, the Mickelson catheter was utilized to select the celiac artery and selective celiac arteriograms were performed in various obliquities. Images were reviewed and the arteriogram portion of the procedure was terminated. Completion closure arteriogram was performed. All wires, catheters and sheaths were removed from the patient. Hemostasis was achieved at the right groin access site with deployment of a ExoSeal closure device and manual compression. _________________________________________________________ Next, attention was made towards placement of the IVC filter. Under sterile condition and local anesthesia, right common femoral venous access was performed with ultrasound. An ultrasound image was saved and sent to PACS. Over a guidewire, the IVC filter delivery sheath and inner dilator were advanced  into  the IVC just above the IVC bifurcation. Contrast injection was performed for an IVC venogram. Through the delivery sheath, a retrievable Denali IVC filter was deployed below the level of the renal veins and above the IVC bifurcation. Limited post deployment venacavagram was performed. The delivery sheath was removed and hemostasis was obtained with manual compression. A dressing was placed. The patient tolerated the procedure well without immediate post procedural complication. FINDINGS: Selective celiac arteriogram demonstrates non conventional branching pattern without supply to the left gastric artery or significant supply to the gastric fundus, as was demonstrated on preceding CTA. As such, the accessory left gastric artery arising directly from the abdominal aorta was selected with sub selective arteriogram demonstrating a tangle of blood vessels contributing arterial supply to the portion of the stomach containing the endoscopy clip. Caudal division of the accessory left gastric artery supplies the dominant arterial contribution to the portion of the stomach containing the endoscopy clip and as such, was percutaneously coil embolized with multiple overlapping 2 and 3 mm interlock coils to near the vessel's origin. Completion arteriogram demonstrates a technically excellent result with no definitive persistent arterial supply regional to the endoscopy clip. Following the percutaneous coil embolization, several additional selective arteriograms were performed in various obliquities, again confirming no definitive supply to the gastric fundus. As such, additional embolization was not performed. The IVC is patent. No evidence of thrombus, stenosis, or occlusion. No variant venous anatomy. Successful placement of the IVC filter below the level of the renal veins. IMPRESSION: 1. Technically successful percutaneous coil embolization of the caudal division of the accessory left gastric artery serving as the  predominant arterial supply to the portion of the stomach containing the endoscopy clip. 2. Selective celiac arteriogram fails to delineate significant arterial supply to the gastric fundus and in particular, to the portion of stomach regional to the endoscopy clip. 3. Successful ultrasound and fluoroscopically guided placement of an infrarenal retrievable IVC filter. PLAN: - Patient is to lie flat for 4 hours. Continued aggressive resuscitation as per the critical care team. Note, as the patient has experienced an acute large bleeding episode, I would expect the patient's H and H has not yet reached a nadir but hopefully will stabilize in the next 24-48 hours. - The IVC filter is potentially retrievable. The patient will be assessed for filter retrieval by Interventional Radiology in approximately 8-12 weeks. Further recommendations regarding filter retrieval, continued surveillance or declaration of device permanence, will be made at that time. Electronically Signed   By: Sandi Mariscal M.D.   On: 04/12/2017 07:04   Ir Angiogram Follow Up Study  Result Date: 04/12/2017 INDICATION: History of recent upper GI bleed, post endoscopy approximately 1 week ago resulting in placement of an endoscopy clip at the location of a bleeding gastric ulcer. Patient subsequently found to have a lower extremity DVT and was started on anti coagulation (patient has received 2 doses of Xarelto). Unfortunately, this resulted in a recurrent large upper GI bleed. Repeat upper endoscopy demonstrates a large amount of blood products within the stomach with inability to achieve hemostasis from endoscopic approach. As such, request made for emergent mesenteric arteriogram and potential percutaneous coil embolization as well as placement of an IVC filter for temporary caval interruption purposes. EXAM: 1. ULTRASOUND GUIDANCE FOR VENOUS AND ARTERIAL ACCESS 2. SELECTIVE CELIAC ARTERIOGRAM. 3. SELECTIVE ACCESSORY LEFT GASTRIC ARTERIOGRAM. 4.  SELECTIVE ARTERIOGRAM OF DISTAL TRIBUTARY OF THE LEFT GASTRIC ARTERY (3rd ORDER) AND PERCUTANEOUS COIL EMBOLIZATION 5. IVC CATHETERIZATION AND  VENOGRAM 6. IVC FILTER INSERTION COMPARISON:  CTA of the abdomen pelvis - 04/03/2017; chest CT - 04/07/2017 MEDICATIONS: None. ANESTHESIA/SEDATION: Versed 58 mg IV Sedation Time: minutes; The patient was continuously monitored during the procedure by the interventional radiology nurse under my direct supervision. CONTRAST:  75 cc Isovue 300 FLUOROSCOPY TIME:  9 Minutes 24 seconds (161 mGy) COMPLICATIONS: None immediate. PROCEDURE: Informed written consent was obtained from the patient's brother following explanation of the procedure, risks, benefits and alternatives. A time out was performed prior to the initiation of the procedure. The right groin was prepped and draped in usual sterile fashion. Maximal barrier sterile technique utilized including caps, mask, sterile gowns, sterile gloves, large sterile drape, hand hygiene, and chlorhexidine prep. The right femoral head was marked fluoroscopically. Under ultrasound guidance, the right common femoral artery was accessed with a micropuncture kit after the overlying soft tissues were anesthetized with 1% lidocaine. An ultrasound image was saved for documentation purposes. The micropuncture sheath was exchanged for a 6 [place] vascular sheath over a Bentson wire. (Note, a 6 [place] vascular sheath was selected for access given suspected hemodynamically significant stenosis within the mid aspect of the abdominal aorta and potential necessity for angioplasty.) A closure arteriogram was performed through the side of the sheath confirming access within the right common femoral artery, however the 6 [place] vascular sheath was noted to be near occlusive secondary to a combination of atherosclerotic plaque as well as the vasospasm. With the use of a Kumpe catheter, a Bentson wire was advanced to the level of the caudal aspect of the  thoracic aorta. Limited abdominal aortogram was performed demonstrating patency of the abdominal aorta with the 5 French catheter in place and as such abdominal aortic angioplasty would not be necessary and the decision was made to downsized the vascular sheath a 5 [place]. Again, closure arteriogram was performed demonstrating preferential flow through the right internal iliac artery with atretic flow surrounding the vascular sheath. Given patient's critical state the decision was made to proceed with the mesenteric arteriogram and embolization in an expeditious manner in lieu of abandoning the femoral access for a brachial approach. Over a Bentson wire, a Mickelson catheter was advanced to the level of the thoracic aorta where it was back bled and flushed. The catheter was then utilized to select the celiac artery and a selective celiac arteriogram was performed The Mickelson catheter was then utilized to select the left gastric artery and a selective left gastric arteriogram was performed. With the use of an 0.014 fathom microwire, a regular Renegade microcatheter was advanced into a distal tributary of the left gastric artery serving as the predominant arterial supply to the tangle of vessels regional to the endoscopy clip. Selective injection confirmed appropriate positioning. The distal aspect of the branch vessel was then percutaneously coil embolized with multiple overlapping 2 mm and 3 mm soft and regular interlock coils. The microcatheter was withdrawn into the central aspect of the left gastric artery and a post embolization arteriogram was performed. The microcatheter was removed and a completion left gastric arteriogram was performed via the Mickelson catheter Again, the Mickelson catheter was utilized to select the celiac artery and selective celiac arteriograms were performed in various obliquities. Images were reviewed and the arteriogram portion of the procedure was terminated. Completion closure  arteriogram was performed. All wires, catheters and sheaths were removed from the patient. Hemostasis was achieved at the right groin access site with deployment of a ExoSeal closure device and manual compression. _________________________________________________________  Next, attention was made towards placement of the IVC filter. Under sterile condition and local anesthesia, right common femoral venous access was performed with ultrasound. An ultrasound image was saved and sent to PACS. Over a guidewire, the IVC filter delivery sheath and inner dilator were advanced into the IVC just above the IVC bifurcation. Contrast injection was performed for an IVC venogram. Through the delivery sheath, a retrievable Denali IVC filter was deployed below the level of the renal veins and above the IVC bifurcation. Limited post deployment venacavagram was performed. The delivery sheath was removed and hemostasis was obtained with manual compression. A dressing was placed. The patient tolerated the procedure well without immediate post procedural complication. FINDINGS: Selective celiac arteriogram demonstrates non conventional branching pattern without supply to the left gastric artery or significant supply to the gastric fundus, as was demonstrated on preceding CTA. As such, the accessory left gastric artery arising directly from the abdominal aorta was selected with sub selective arteriogram demonstrating a tangle of blood vessels contributing arterial supply to the portion of the stomach containing the endoscopy clip. Caudal division of the accessory left gastric artery supplies the dominant arterial contribution to the portion of the stomach containing the endoscopy clip and as such, was percutaneously coil embolized with multiple overlapping 2 and 3 mm interlock coils to near the vessel's origin. Completion arteriogram demonstrates a technically excellent result with no definitive persistent arterial supply regional to the  endoscopy clip. Following the percutaneous coil embolization, several additional selective arteriograms were performed in various obliquities, again confirming no definitive supply to the gastric fundus. As such, additional embolization was not performed. The IVC is patent. No evidence of thrombus, stenosis, or occlusion. No variant venous anatomy. Successful placement of the IVC filter below the level of the renal veins. IMPRESSION: 1. Technically successful percutaneous coil embolization of the caudal division of the accessory left gastric artery serving as the predominant arterial supply to the portion of the stomach containing the endoscopy clip. 2. Selective celiac arteriogram fails to delineate significant arterial supply to the gastric fundus and in particular, to the portion of stomach regional to the endoscopy clip. 3. Successful ultrasound and fluoroscopically guided placement of an infrarenal retrievable IVC filter. PLAN: - Patient is to lie flat for 4 hours. Continued aggressive resuscitation as per the critical care team. Note, as the patient has experienced an acute large bleeding episode, I would expect the patient's H and H has not yet reached a nadir but hopefully will stabilize in the next 24-48 hours. - The IVC filter is potentially retrievable. The patient will be assessed for filter retrieval by Interventional Radiology in approximately 8-12 weeks. Further recommendations regarding filter retrieval, continued surveillance or declaration of device permanence, will be made at that time. Electronically Signed   By: Sandi Mariscal M.D.   On: 04/12/2017 07:04   Ir Ivc Filter Plmt / S&i /img Guid/mod Sed  Result Date: 04/12/2017 INDICATION: History of recent upper GI bleed, post endoscopy approximately 1 week ago resulting in placement of an endoscopy clip at the location of a bleeding gastric ulcer. Patient subsequently found to have a lower extremity DVT and was started on anti coagulation (patient  has received 2 doses of Xarelto). Unfortunately, this resulted in a recurrent large upper GI bleed. Repeat upper endoscopy demonstrates a large amount of blood products within the stomach with inability to achieve hemostasis from endoscopic approach. As such, request made for emergent mesenteric arteriogram and potential percutaneous coil embolization as well as  placement of an IVC filter for temporary caval interruption purposes. EXAM: 1. ULTRASOUND GUIDANCE FOR VENOUS AND ARTERIAL ACCESS 2. SELECTIVE CELIAC ARTERIOGRAM. 3. SELECTIVE ACCESSORY LEFT GASTRIC ARTERIOGRAM. 4. SELECTIVE ARTERIOGRAM OF DISTAL TRIBUTARY OF THE LEFT GASTRIC ARTERY (3rd ORDER) AND PERCUTANEOUS COIL EMBOLIZATION 5. IVC CATHETERIZATION AND VENOGRAM 6. IVC FILTER INSERTION COMPARISON:  CTA of the abdomen pelvis - 04/03/2017; chest CT - 04/07/2017 MEDICATIONS: None. ANESTHESIA/SEDATION: Versed 58 mg IV Sedation Time: minutes; The patient was continuously monitored during the procedure by the interventional radiology nurse under my direct supervision. CONTRAST:  75 cc Isovue 300 FLUOROSCOPY TIME:  9 Minutes 24 seconds (660 mGy) COMPLICATIONS: None immediate. PROCEDURE: Informed written consent was obtained from the patient's brother following explanation of the procedure, risks, benefits and alternatives. A time out was performed prior to the initiation of the procedure. The right groin was prepped and draped in usual sterile fashion. Maximal barrier sterile technique utilized including caps, mask, sterile gowns, sterile gloves, large sterile drape, hand hygiene, and chlorhexidine prep. The right femoral head was marked fluoroscopically. Under ultrasound guidance, the right common femoral artery was accessed with a micropuncture kit after the overlying soft tissues were anesthetized with 1% lidocaine. An ultrasound image was saved for documentation purposes. The micropuncture sheath was exchanged for a 6 [place] vascular sheath over a Bentson  wire. (Note, a 6 [place] vascular sheath was selected for access given suspected hemodynamically significant stenosis within the mid aspect of the abdominal aorta and potential necessity for angioplasty.) A closure arteriogram was performed through the side of the sheath confirming access within the right common femoral artery, however the 6 [place] vascular sheath was noted to be near occlusive secondary to a combination of atherosclerotic plaque as well as the vasospasm. With the use of a Kumpe catheter, a Bentson wire was advanced to the level of the caudal aspect of the thoracic aorta. Limited abdominal aortogram was performed demonstrating patency of the abdominal aorta with the 5 French catheter in place and as such abdominal aortic angioplasty would not be necessary and the decision was made to downsized the vascular sheath a 5 [place]. Again, closure arteriogram was performed demonstrating preferential flow through the right internal iliac artery with atretic flow surrounding the vascular sheath. Given patient's critical state the decision was made to proceed with the mesenteric arteriogram and embolization in an expeditious manner in lieu of abandoning the femoral access for a brachial approach. Over a Bentson wire, a Mickelson catheter was advanced to the level of the thoracic aorta where it was back bled and flushed. The catheter was then utilized to select the celiac artery and a selective celiac arteriogram was performed The Mickelson catheter was then utilized to select the left gastric artery and a selective left gastric arteriogram was performed. With the use of an 0.014 fathom microwire, a regular Renegade microcatheter was advanced into a distal tributary of the left gastric artery serving as the predominant arterial supply to the tangle of vessels regional to the endoscopy clip. Selective injection confirmed appropriate positioning. The distal aspect of the branch vessel was then percutaneously coil  embolized with multiple overlapping 2 mm and 3 mm soft and regular interlock coils. The microcatheter was withdrawn into the central aspect of the left gastric artery and a post embolization arteriogram was performed. The microcatheter was removed and a completion left gastric arteriogram was performed via the Mickelson catheter Again, the Mickelson catheter was utilized to select the celiac artery and selective celiac arteriograms were performed  in various obliquities. Images were reviewed and the arteriogram portion of the procedure was terminated. Completion closure arteriogram was performed. All wires, catheters and sheaths were removed from the patient. Hemostasis was achieved at the right groin access site with deployment of a ExoSeal closure device and manual compression. _________________________________________________________ Next, attention was made towards placement of the IVC filter. Under sterile condition and local anesthesia, right common femoral venous access was performed with ultrasound. An ultrasound image was saved and sent to PACS. Over a guidewire, the IVC filter delivery sheath and inner dilator were advanced into the IVC just above the IVC bifurcation. Contrast injection was performed for an IVC venogram. Through the delivery sheath, a retrievable Denali IVC filter was deployed below the level of the renal veins and above the IVC bifurcation. Limited post deployment venacavagram was performed. The delivery sheath was removed and hemostasis was obtained with manual compression. A dressing was placed. The patient tolerated the procedure well without immediate post procedural complication. FINDINGS: Selective celiac arteriogram demonstrates non conventional branching pattern without supply to the left gastric artery or significant supply to the gastric fundus, as was demonstrated on preceding CTA. As such, the accessory left gastric artery arising directly from the abdominal aorta was selected  with sub selective arteriogram demonstrating a tangle of blood vessels contributing arterial supply to the portion of the stomach containing the endoscopy clip. Caudal division of the accessory left gastric artery supplies the dominant arterial contribution to the portion of the stomach containing the endoscopy clip and as such, was percutaneously coil embolized with multiple overlapping 2 and 3 mm interlock coils to near the vessel's origin. Completion arteriogram demonstrates a technically excellent result with no definitive persistent arterial supply regional to the endoscopy clip. Following the percutaneous coil embolization, several additional selective arteriograms were performed in various obliquities, again confirming no definitive supply to the gastric fundus. As such, additional embolization was not performed. The IVC is patent. No evidence of thrombus, stenosis, or occlusion. No variant venous anatomy. Successful placement of the IVC filter below the level of the renal veins. IMPRESSION: 1. Technically successful percutaneous coil embolization of the caudal division of the accessory left gastric artery serving as the predominant arterial supply to the portion of the stomach containing the endoscopy clip. 2. Selective celiac arteriogram fails to delineate significant arterial supply to the gastric fundus and in particular, to the portion of stomach regional to the endoscopy clip. 3. Successful ultrasound and fluoroscopically guided placement of an infrarenal retrievable IVC filter. PLAN: - Patient is to lie flat for 4 hours. Continued aggressive resuscitation as per the critical care team. Note, as the patient has experienced an acute large bleeding episode, I would expect the patient's H and H has not yet reached a nadir but hopefully will stabilize in the next 24-48 hours. - The IVC filter is potentially retrievable. The patient will be assessed for filter retrieval by Interventional Radiology in  approximately 8-12 weeks. Further recommendations regarding filter retrieval, continued surveillance or declaration of device permanence, will be made at that time. Electronically Signed   By: Sandi Mariscal M.D.   On: 04/12/2017 07:04   Ir US Guide Vasc Access Right  Result Date: 04/12/2017 INDICATION: History of recent upper GI bleed, post endoscopy approximately 1 week ago resulting in placement of an endoscopy clip at the location of a bleeding gastric ulcer. Patient subsequently found to have a lower extremity DVT and was started on anti coagulation (patient has received 2 doses of Xarelto).  Unfortunately, this resulted in a recurrent large upper GI bleed. Repeat upper endoscopy demonstrates a large amount of blood products within the stomach with inability to achieve hemostasis from endoscopic approach. As such, request made for emergent mesenteric arteriogram and potential percutaneous coil embolization as well as placement of an IVC filter for temporary caval interruption purposes. EXAM: 1. ULTRASOUND GUIDANCE FOR VENOUS AND ARTERIAL ACCESS 2. SELECTIVE CELIAC ARTERIOGRAM. 3. SELECTIVE ACCESSORY LEFT GASTRIC ARTERIOGRAM. 4. SELECTIVE ARTERIOGRAM OF DISTAL TRIBUTARY OF THE LEFT GASTRIC ARTERY (3rd ORDER) AND PERCUTANEOUS COIL EMBOLIZATION 5. IVC CATHETERIZATION AND VENOGRAM 6. IVC FILTER INSERTION COMPARISON:  CTA of the abdomen pelvis - 04/03/2017; chest CT - 04/07/2017 MEDICATIONS: None. ANESTHESIA/SEDATION: Versed 58 mg IV Sedation Time: minutes; The patient was continuously monitored during the procedure by the interventional radiology nurse under my direct supervision. CONTRAST:  75 cc Isovue 300 FLUOROSCOPY TIME:  9 Minutes 24 seconds (737 mGy) COMPLICATIONS: None immediate. PROCEDURE: Informed written consent was obtained from the patient's brother following explanation of the procedure, risks, benefits and alternatives. A time out was performed prior to the initiation of the procedure. The right groin  was prepped and draped in usual sterile fashion. Maximal barrier sterile technique utilized including caps, mask, sterile gowns, sterile gloves, large sterile drape, hand hygiene, and chlorhexidine prep. The right femoral head was marked fluoroscopically. Under ultrasound guidance, the right common femoral artery was accessed with a micropuncture kit after the overlying soft tissues were anesthetized with 1% lidocaine. An ultrasound image was saved for documentation purposes. The micropuncture sheath was exchanged for a 6 [place] vascular sheath over a Bentson wire. (Note, a 6 [place] vascular sheath was selected for access given suspected hemodynamically significant stenosis within the mid aspect of the abdominal aorta and potential necessity for angioplasty.) A closure arteriogram was performed through the side of the sheath confirming access within the right common femoral artery, however the 6 [place] vascular sheath was noted to be near occlusive secondary to a combination of atherosclerotic plaque as well as the vasospasm. With the use of a Kumpe catheter, a Bentson wire was advanced to the level of the caudal aspect of the thoracic aorta. Limited abdominal aortogram was performed demonstrating patency of the abdominal aorta with the 5 French catheter in place and as such abdominal aortic angioplasty would not be necessary and the decision was made to downsized the vascular sheath a 5 [place]. Again, closure arteriogram was performed demonstrating preferential flow through the right internal iliac artery with atretic flow surrounding the vascular sheath. Given patient's critical state the decision was made to proceed with the mesenteric arteriogram and embolization in an expeditious manner in lieu of abandoning the femoral access for a brachial approach. Over a Bentson wire, a Mickelson catheter was advanced to the level of the thoracic aorta where it was back bled and flushed. The catheter was then utilized to  select the celiac artery and a selective celiac arteriogram was performed The Mickelson catheter was then utilized to select the left gastric artery and a selective left gastric arteriogram was performed. With the use of an 0.014 fathom microwire, a regular Renegade microcatheter was advanced into a distal tributary of the left gastric artery serving as the predominant arterial supply to the tangle of vessels regional to the endoscopy clip. Selective injection confirmed appropriate positioning. The distal aspect of the branch vessel was then percutaneously coil embolized with multiple overlapping 2 mm and 3 mm soft and regular interlock coils. The microcatheter was withdrawn into the  central aspect of the left gastric artery and a post embolization arteriogram was performed. The microcatheter was removed and a completion left gastric arteriogram was performed via the Mickelson catheter Again, the Mickelson catheter was utilized to select the celiac artery and selective celiac arteriograms were performed in various obliquities. Images were reviewed and the arteriogram portion of the procedure was terminated. Completion closure arteriogram was performed. All wires, catheters and sheaths were removed from the patient. Hemostasis was achieved at the right groin access site with deployment of a ExoSeal closure device and manual compression. _________________________________________________________ Next, attention was made towards placement of the IVC filter. Under sterile condition and local anesthesia, right common femoral venous access was performed with ultrasound. An ultrasound image was saved and sent to PACS. Over a guidewire, the IVC filter delivery sheath and inner dilator were advanced into the IVC just above the IVC bifurcation. Contrast injection was performed for an IVC venogram. Through the delivery sheath, a retrievable Denali IVC filter was deployed below the level of the renal veins and above the IVC  bifurcation. Limited post deployment venacavagram was performed. The delivery sheath was removed and hemostasis was obtained with manual compression. A dressing was placed. The patient tolerated the procedure well without immediate post procedural complication. FINDINGS: Selective celiac arteriogram demonstrates non conventional branching pattern without supply to the left gastric artery or significant supply to the gastric fundus, as was demonstrated on preceding CTA. As such, the accessory left gastric artery arising directly from the abdominal aorta was selected with sub selective arteriogram demonstrating a tangle of blood vessels contributing arterial supply to the portion of the stomach containing the endoscopy clip. Caudal division of the accessory left gastric artery supplies the dominant arterial contribution to the portion of the stomach containing the endoscopy clip and as such, was percutaneously coil embolized with multiple overlapping 2 and 3 mm interlock coils to near the vessel's origin. Completion arteriogram demonstrates a technically excellent result with no definitive persistent arterial supply regional to the endoscopy clip. Following the percutaneous coil embolization, several additional selective arteriograms were performed in various obliquities, again confirming no definitive supply to the gastric fundus. As such, additional embolization was not performed. The IVC is patent. No evidence of thrombus, stenosis, or occlusion. No variant venous anatomy. Successful placement of the IVC filter below the level of the renal veins. IMPRESSION: 1. Technically successful percutaneous coil embolization of the caudal division of the accessory left gastric artery serving as the predominant arterial supply to the portion of the stomach containing the endoscopy clip. 2. Selective celiac arteriogram fails to delineate significant arterial supply to the gastric fundus and in particular, to the portion of  stomach regional to the endoscopy clip. 3. Successful ultrasound and fluoroscopically guided placement of an infrarenal retrievable IVC filter. PLAN: - Patient is to lie flat for 4 hours. Continued aggressive resuscitation as per the critical care team. Note, as the patient has experienced an acute large bleeding episode, I would expect the patient's H and H has not yet reached a nadir but hopefully will stabilize in the next 24-48 hours. - The IVC filter is potentially retrievable. The patient will be assessed for filter retrieval by Interventional Radiology in approximately 8-12 weeks. Further recommendations regarding filter retrieval, continued surveillance or declaration of device permanence, will be made at that time. Electronically Signed   By: Sandi Mariscal M.D.   On: 04/12/2017 07:04   Dg Chest Port 1 View  Result Date: 04/13/2017 CLINICAL DATA:  Respiratory  failure EXAM: PORTABLE CHEST 1 VIEW COMPARISON:  Portable chest x-ray of April 12, 2017 FINDINGS: The lungs are reasonably well inflated. There is persistent increased density at both lung bases greatest on the left. The heart is normal in size. The pulmonary vascularity is less engorged today. The endotracheal tube tip projects 4.5 cm above the carina. The left internal jugular venous catheter tip projects at the junction of the right and left brachiocephalic veins. IMPRESSION: Decreased pulmonary edema. Persistent bibasilar subsegmental atelectasis or pneumonia greatest on the left. Electronically Signed   By: David  Martinique M.D.   On: 04/13/2017 07:00   Dg Chest Port 1 View  Result Date: 04/12/2017 CLINICAL DATA:  Central line placement EXAM: PORTABLE CHEST 1 VIEW COMPARISON:  04/12/2017 FINDINGS: Endotracheal tube tip measures 4.7 cm above the carina. A left jugular central venous catheter has been placed. Tip is over the mid mediastinum consistent with location in the brachiocephalic vein. No pneumothorax. Mild cardiac enlargement. Developing  pulmonary vascular congestion with developing perihilar infiltration suggesting edema. No blunting of costophrenic angles. IMPRESSION: Left central venous catheter tip projects over the brachiocephalic vein. No pneumothorax. Developing cardiac enlargement with perihilar edema. Electronically Signed   By: Lucienne Capers M.D.   On: 04/12/2017 04:05   Dg Chest Port 1 View  Result Date: 04/12/2017 CLINICAL DATA:  Intubation EXAM: PORTABLE CHEST 1 VIEW COMPARISON:  CT chest 04/07/2017.  Chest 04/03/2017 FINDINGS: Shallow inspiration. Heart size and pulmonary vascularity are normal. 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 An endotracheal tube is been placed with tip measuring 4.8 cm above the carina. Lungs are clear. No blunting of costophrenic angles. No pneumothorax. IMPRESSION: Endotracheal tube placed with tip measuring 4.8 cm above the carina. Lungs are clear. Electronically Signed   By: Lucienne Capers M.D.   On: 04/12/2017 01:22   [place] Guide Roadmapping  Result Date: 04/12/2017 INDICATION: History of recent upper GI bleed, post endoscopy approximately 1 week ago resulting in placement of an endoscopy clip at the location of a bleeding gastric ulcer. Patient subsequently found to have a lower extremity DVT and was started on anti coagulation (patient has received 2 doses of Xarelto). Unfortunately, this resulted in a recurrent large upper GI bleed. Repeat upper endoscopy demonstrates a large amount of blood products within the stomach with inability to achieve hemostasis from endoscopic approach. As such, request made for emergent mesenteric arteriogram and potential percutaneous coil embolization as well as placement of an IVC filter for temporary caval interruption purposes. EXAM: 1. ULTRASOUND GUIDANCE FOR VENOUS AND ARTERIAL ACCESS 2. SELECTIVE CELIAC ARTERIOGRAM. 3. SELECTIVE ACCESSORY LEFT GASTRIC ARTERIOGRAM. 4. SELECTIVE  ARTERIOGRAM OF DISTAL TRIBUTARY OF THE LEFT GASTRIC ARTERY (3rd ORDER) AND PERCUTANEOUS COIL EMBOLIZATION 5. IVC CATHETERIZATION AND VENOGRAM 6. IVC FILTER INSERTION COMPARISON:  CTA of the abdomen pelvis - 04/03/2017; chest CT - 04/07/2017 MEDICATIONS: None. ANESTHESIA/SEDATION: Versed 58 mg IV Sedation Time: minutes; The patient was continuously monitored during the procedure by the interventional radiology nurse under my direct supervision. CONTRAST:  75 cc Isovue 300 FLUOROSCOPY TIME:  9 Minutes 24 seconds (237 mGy) COMPLICATIONS: None immediate. PROCEDURE: Informed written consent was obtained from the patient's brother following explanation of the procedure, risks, benefits and alternatives. A time out was performed prior to the initiation of the procedure. The right groin was prepped and draped in usual sterile fashion. Maximal barrier sterile technique utilized including caps, mask, sterile gowns, sterile gloves,  large sterile drape, hand hygiene, and chlorhexidine prep. The right femoral head was marked fluoroscopically. Under ultrasound guidance, the right common femoral artery was accessed with a micropuncture kit after the overlying soft tissues were anesthetized with 1% lidocaine. An ultrasound image was saved for documentation purposes. The micropuncture sheath was exchanged for a 6 [place] vascular sheath over a Bentson wire. (Note, a 6 [place] vascular sheath was selected for access given suspected hemodynamically significant stenosis within the mid aspect of the abdominal aorta and potential necessity for angioplasty.) A closure arteriogram was performed through the side of the sheath confirming access within the right common femoral artery, however the 6 [place] vascular sheath was noted to be near occlusive secondary to a combination of atherosclerotic plaque as well as the vasospasm. With the use of a Kumpe catheter, a Bentson wire was advanced to the level of the caudal aspect of the thoracic  aorta. Limited abdominal aortogram was performed demonstrating patency of the abdominal aorta with the 5 French catheter in place and as such abdominal aortic angioplasty would not be necessary and the decision was made to downsized the vascular sheath a 5 [place]. Again, closure arteriogram was performed demonstrating preferential flow through the right internal iliac artery with atretic flow surrounding the vascular sheath. Given patient's critical state the decision was made to proceed with the mesenteric arteriogram and embolization in an expeditious manner in lieu of abandoning the femoral access for a brachial approach. Over a Bentson wire, a Mickelson catheter was advanced to the level of the thoracic aorta where it was back bled and flushed. The catheter was then utilized to select the celiac artery and a selective celiac arteriogram was performed The Mickelson catheter was then utilized to select the left gastric artery and a selective left gastric arteriogram was performed. With the use of an 0.014 fathom microwire, a regular Renegade microcatheter was advanced into a distal tributary of the left gastric artery serving as the predominant arterial supply to the tangle of vessels regional to the endoscopy clip. Selective injection confirmed appropriate positioning. The distal aspect of the branch vessel was then percutaneously coil embolized with multiple overlapping 2 mm and 3 mm soft and regular interlock coils. The microcatheter was withdrawn into the central aspect of the left gastric artery and a post embolization arteriogram was performed. The microcatheter was removed and a completion left gastric arteriogram was performed via the Mickelson catheter Again, the Mickelson catheter was utilized to select the celiac artery and selective celiac arteriograms were performed in various obliquities. Images were reviewed and the arteriogram portion of the procedure was terminated. Completion closure arteriogram  was performed. All wires, catheters and sheaths were removed from the patient. Hemostasis was achieved at the right groin access site with deployment of a ExoSeal closure device and manual compression. _________________________________________________________ Next, attention was made towards placement of the IVC filter. Under sterile condition and local anesthesia, right common femoral venous access was performed with ultrasound. An ultrasound image was saved and sent to PACS. Over a guidewire, the IVC filter delivery sheath and inner dilator were advanced into the IVC just above the IVC bifurcation. Contrast injection was performed for an IVC venogram. Through the delivery sheath, a retrievable Denali IVC filter was deployed below the level of the renal veins and above the IVC bifurcation. Limited post deployment venacavagram was performed. The delivery sheath was removed and hemostasis was obtained with manual compression. A dressing was placed. The patient tolerated the procedure well without immediate post  procedural complication. FINDINGS: Selective celiac arteriogram demonstrates non conventional branching pattern without supply to the left gastric artery or significant supply to the gastric fundus, as was demonstrated on preceding CTA. As such, the accessory left gastric artery arising directly from the abdominal aorta was selected with sub selective arteriogram demonstrating a tangle of blood vessels contributing arterial supply to the portion of the stomach containing the endoscopy clip. Caudal division of the accessory left gastric artery supplies the dominant arterial contribution to the portion of the stomach containing the endoscopy clip and as such, was percutaneously coil embolized with multiple overlapping 2 and 3 mm interlock coils to near the vessel's origin. Completion arteriogram demonstrates a technically excellent result with no definitive persistent arterial supply regional to the endoscopy  clip. Following the percutaneous coil embolization, several additional selective arteriograms were performed in various obliquities, again confirming no definitive supply to the gastric fundus. As such, additional embolization was not performed. The IVC is patent. No evidence of thrombus, stenosis, or occlusion. No variant venous anatomy. Successful placement of the IVC filter below the level of the renal veins. IMPRESSION: 1. Technically successful percutaneous coil embolization of the caudal division of the accessory left gastric artery serving as the predominant arterial supply to the portion of the stomach containing the endoscopy clip. 2. Selective celiac arteriogram fails to delineate significant arterial supply to the gastric fundus and in particular, to the portion of stomach regional to the endoscopy clip. 3. Successful ultrasound and fluoroscopically guided placement of an infrarenal retrievable IVC filter. PLAN: - Patient is to lie flat for 4 hours. Continued aggressive resuscitation as per the critical care team. Note, as the patient has experienced an acute large bleeding episode, I would expect the patient's H and H has not yet reached a nadir but hopefully will stabilize in the next 24-48 hours. - The IVC filter is potentially retrievable. The patient will be assessed for filter retrieval by Interventional Radiology in approximately 8-12 weeks. Further recommendations regarding filter retrieval, continued surveillance or declaration of device permanence, will be made at that time. Electronically Signed   By: Sandi Mariscal M.D.   On: 04/12/2017 07:04    Medications:  Scheduled: . chlorhexidine gluconate (MEDLINE KIT)  15 mL Mouth Rinse BID  . Gerhardt's butt cream   Topical TID  . insulin aspart  2-6 Units Subcutaneous Q4H  . mouth rinse  15 mL Mouth Rinse QID  . pantoprazole  40 mg Intravenous Q12H  . sodium chloride flush  3 mL Intravenous Q12H   Continuous: . sodium chloride    . sodium  chloride    . fentaNYL infusion INTRAVENOUS 150 mcg/hr (04/13/17 0700)  . norepinephrine (LEVOPHED) Adult infusion 40 mcg/min (04/13/17 0700)  . potassium chloride Stopped (04/12/17 2246)  .  sodium bicarbonate  infusion 1000 mL 125 mL/hr at 04/13/17 0700  . vasopressin (PITRESSIN) infusion - *FOR SHOCK* 0.03 Units/min (04/13/17 0700)    Assessment/Plan: 1) Severe upper GI bleed. 2) Anemia. 3) Hypotension.   I reviewed and discussed the weekend events with Dr. Ardis Hughs.  I am appreciative of his and Dr. Deniece Portela assistance.  There is a dramatic drop in her HGB, but no overt evidence of bleeding at this time.  Hopefully the anemia is a reflection of this recent bleed rather than an ongoing bleeding event.  Plan: 1) Agree with transfusion. 2) If there is any evidence of hematemesis, I will repeat her EGD. 3) Continue with supportive care.    LOS: 11 days  Kayla Wyatt D 04/13/2017, 7:57 AM

## 2017-04-13 NOTE — Progress Notes (Signed)
Patient ID: Kayla Wyatt, female   DOB: Nov 13, 1955, 62 y.o.   MRN: [id number] Patient about to undergo angiography. History reviewed, 2 endoscopies and ct scan as well as prior angio. She continues to bleed.  Have discussed with Dr Laurence Ferrari- if unable to stop bleeding angiographically likely needs surgery. Will follow up after angio.

## 2017-04-13 NOTE — Progress Notes (Signed)
Changed fentanyl drip to hang to new CVL. Wasted approx. 21ml in tubing, wasted in trash. Witnessed by [employer], IR Tech

## 2017-04-13 NOTE — Procedures (Signed)
Interventional Radiology Procedure Note  Procedure: Visceral angiogram and embolization of the inferior division of the splenic artery and its associated short gastric arteries.   Complications: None  Estimated Blood Loss: None  Recommendations: - Continue to trend H&H and transfuse as needed.  - Embolization therapy now maximized, if hemorrhage continues, patient may require surgery.   Signed,  Criselda Peaches, MD

## 2017-04-13 NOTE — Progress Notes (Signed)
Attending:  I have seen and examined the patient with nurse practitioner/resident and agree with the note above.  We formulated the plan together and I elicited the following history.    Subjective: In shock Hgb down No melena or blood in stool   Objective: Vitals:   04/13/17 0700 04/13/17 0738 04/13/17 0800 04/13/17 0836  BP: 113/62 (!) 116/51 115/61 106/79  Pulse: (!) 112 (!) 112 96   Resp: 20 (!) 24 16   Temp:    99.1 F (37.3 C)  TempSrc:    Axillary  SpO2: 100% 96% 100%   Weight:      Height:       Vent Mode: PRVC FiO2 (%):  [40 %] 40 % Set Rate:  [18 bmp] 18 bmp Vt Set:  [420 mL] 420 mL PEEP:  [5 cmH20] 5 cmH20 Plateau Pressure:  [13 cmH20-17 cmH20] 13 cmH20  Intake/Output Summary (Last 24 hours) at 04/13/2017 8768 Last data filed at 04/13/2017 0836 Gross per 24 hour  Intake 5628.42 ml  Output 845 ml  Net 4783.42 ml    General:  In bed on vent HENT: NCAT ETT in place PULM: CTA B, vent supported breathing CV: RRR, no mgr GI: BS+, soft, nontender MSK: normal bulk and tone Neuro: sedated on vent    CBC    Component Value Date/Time   WBC 23.5 (H) 04/13/2017 0346   RBC 1.50 (L) 04/13/2017 0346   HGB 4.5 (LL) 04/13/2017 0346   HCT 13.0 (L) 04/13/2017 0346   PLT 66 (L) 04/13/2017 0346   MCV 86.7 04/13/2017 0346   MCH 30.0 04/13/2017 0346   MCHC 34.6 04/13/2017 0346   RDW 15.7 (H) 04/13/2017 0346   LYMPHSABS 2.5 04/12/2017 0412   MONOABS 1.8 (H) 04/12/2017 0412   EOSABS 0.0 04/12/2017 0412   BASOSABS 0.0 04/12/2017 0412    BMET    Component Value Date/Time   NA 145 04/13/2017 0346   K 3.9 04/13/2017 0346   CL 105 04/13/2017 0346   CO2 28 04/13/2017 0346   GLUCOSE 223 (H) 04/13/2017 0346   BUN 12 04/13/2017 0346   CREATININE 1.46 (H) 04/13/2017 0346   CALCIUM 6.4 (LL) 04/13/2017 0346   GFRNONAA 37 (L) 04/13/2017 0346   GFRAA 43 (L) 04/13/2017 0346    CXR images reviewed, no pneumothorax, ETT in place, L IJ CVL in  place  Impression/Plan:  Shock: presumably hemorrhagic given low Hgb, but no evidence of GI Bleeding; I worry that she is coagulopathic given significant blood loss; I worry about RP/pelvic/leg bleed given arterial access yesterday > Stat INR > Platelet transfusion now > repeat Hgb at 10:30, goal Hgb > 8gm/dL > OG tube> if no bloody aspirate then send her for STAT CT ab/pelv to look for RP bleed > send lactic acid > continue levophed, vasopressin  Acute respiratory failure> continue full vent support  Discussed with Dr. [name].  My cc time 35 minutes  Roselie Awkward, MD [facility] Pager: [phone] Cell: [phone] After 3pm or if no response, call [phone]

## 2017-04-13 NOTE — Progress Notes (Signed)
Referring Physician(s): Dr. Ardis Hughs  Supervising Physician: Jacqulynn Cadet  Patient Status:  [facility] - In-pt  Chief Complaint: GI Bleed, DVT  Subjective: Remains intubated.  Small amount of BRB in OG. No bloody stools. Remains on pressors due to hypotension.   Allergies: Patient has no known allergies.  Medications: Prior to Admission medications   Medication Sig Start Date End Date Taking? Authorizing Provider  aspirin 81 MG tablet Take 81 mg by mouth daily.   Yes [provider]  cholecalciferol (VITAMIN D) 1000 units tablet Take 1,000 Units by mouth 2 (two) times daily.   Yes [provider]  lisinopril-hydrochlorothiazide (PRINZIDE,ZESTORETIC) 20-12.5 MG tablet Take 1 tablet by mouth daily.   Yes [provider]  simvastatin (ZOCOR) 20 MG tablet Take 1 tablet by mouth daily. 01/02/17  Yes [provider]     Vital Signs: BP 114/70   Pulse 87   Temp 99.1 F (37.3 C) (Oral)   Resp 19   Ht '5\' 6"'$  (1.676 m)   Wt 185 lb 13.6 oz (84.3 kg)   SpO2 100%   BMI 30.00 kg/m   Physical Exam  Intubated, sedated.  Heart:  Regular rate and rhythm.  No adventitious sounds. Chest:  Clear to auscultation Skin:  Right groin soft.  No evidence of bleeding.   Imaging: Ct Abdomen Pelvis Wo Contrast  Result Date: 04/13/2017 CLINICAL DATA:  Evaluate for retroperitoneal hematoma status post arterial access. EXAM: CT ABDOMEN AND PELVIS WITHOUT CONTRAST TECHNIQUE: Multidetector CT imaging of the abdomen and pelvis was performed following the standard protocol without IV contrast. COMPARISON:  04/03/2017 FINDINGS: Lower chest: Moderate bilateral pleural effusions with overlying compressive type atelectasis. Hepatobiliary: No focal liver abnormality. Vicarious excretion of contrast material into the gallbladder is noted. Pancreas: Unremarkable. No pancreatic ductal dilatation or surrounding inflammatory changes. Spleen: Normal in size without focal  abnormality. Adrenals/Urinary Tract: Normal adrenal glands. There is a striated nephrographic appearance of both kidneys, right greater than left. IV contrast material is identified within a dilated left renal collecting system and ureter. Asymmetrically diminished contrast within the right renal collecting system identified. Urinary bladder collapsed around a Foley catheter. Stomach/Bowel: There is a gastrostomy tube with tip in the distal stomach. Moderate to increased attenuation material is identified within the gastric lumen, image 17 of series 3. There also high attenuation material is identified within the small and large bowel loops. Vascular/Lymphatic: Aortic atherosclerosis. No aneurysm. Filter identified within the IVC. There is no adenopathy. Reproductive: Status post hysterectomy. No adnexal masses. Other: There is a small volume of ascites identified. Small to moderate volume of ascites within the right upper quadrant of the abdomen and right lower quadrant of the abdomen and pelvis. There is diffuse body wall edema compatible with anasarca. No retroperitoneal hematoma identified. Musculoskeletal: No acute or significant osseous findings. IMPRESSION: 1. No evidence for retroperitoneal hematoma. 2. Striated nephrographic appearance of both kidneys identified, right greater than left. Imaging findings are favored to represent sequelae of acute tubular necrosis. Not excluded but favored less likely would be an embolic phenomenon resulting in areas of renal infarct. 3. Left-sided hydronephrosis and hydroureter. Etiology indeterminate. Cannot rule out distal ureteral obstruction. 4. Bilateral pleural effusions, ascites and diffuse body wall edema compatible with fluid overload. 5. Intermediate to high attenuation material within the stomach. Although conceivably this could be related to it administration of enteric contrast material ongoing bleeding within the gastric lumen is not excluded. 6.  Aortic  Atherosclerosis (ICD10-I70.0). Electronically Signed  By: Kerby Moors M.D.   On: 04/13/2017 12:00   Ir Angiogram Visceral Selective  Result Date: 04/12/2017 INDICATION: History of recent upper GI bleed, post endoscopy approximately 1 week ago resulting in placement of an endoscopy clip at the location of a bleeding gastric ulcer. Patient subsequently found to have a lower extremity DVT and was started on anti coagulation (patient has received 2 doses of Xarelto). Unfortunately, this resulted in a recurrent large upper GI bleed. Repeat upper endoscopy demonstrates a large amount of blood products within the stomach with inability to achieve hemostasis from endoscopic approach. As such, request made for emergent mesenteric arteriogram and potential percutaneous coil embolization as well as placement of an IVC filter for temporary caval interruption purposes. EXAM: 1. ULTRASOUND GUIDANCE FOR VENOUS AND ARTERIAL ACCESS 2. SELECTIVE CELIAC ARTERIOGRAM. 3. SELECTIVE ACCESSORY LEFT GASTRIC ARTERIOGRAM. 4. SELECTIVE ARTERIOGRAM OF DISTAL TRIBUTARY OF THE LEFT GASTRIC ARTERY (3rd ORDER) AND PERCUTANEOUS COIL EMBOLIZATION 5. IVC CATHETERIZATION AND VENOGRAM 6. IVC FILTER INSERTION COMPARISON:  CTA of the abdomen pelvis - 04/03/2017; chest CT - 04/07/2017 MEDICATIONS: None. ANESTHESIA/SEDATION: Versed 58 mg IV Sedation Time: minutes; The patient was continuously monitored during the procedure by the interventional radiology nurse under my direct supervision. CONTRAST:  75 cc Isovue 300 FLUOROSCOPY TIME:  9 Minutes 24 seconds (448 mGy) COMPLICATIONS: None immediate. PROCEDURE: Informed written consent was obtained from the patient's brother following explanation of the procedure, risks, benefits and alternatives. A time out was performed prior to the initiation of the procedure. The right groin was prepped and draped in usual sterile fashion. Maximal barrier sterile technique utilized including caps, mask, sterile gowns,  sterile gloves, large sterile drape, hand hygiene, and chlorhexidine prep. The right femoral head was marked fluoroscopically. Under ultrasound guidance, the right common femoral artery was accessed with a micropuncture kit after the overlying soft tissues were anesthetized with 1% lidocaine. An ultrasound image was saved for documentation purposes. The micropuncture sheath was exchanged for a 6 [place] vascular sheath over a Bentson wire. (Note, a 6 [place] vascular sheath was selected for access given suspected hemodynamically significant stenosis within the mid aspect of the abdominal aorta and potential necessity for angioplasty.) A closure arteriogram was performed through the side of the sheath confirming access within the right common femoral artery, however the 6 [place] vascular sheath was noted to be near occlusive secondary to a combination of atherosclerotic plaque as well as the vasospasm. With the use of a Kumpe catheter, a Bentson wire was advanced to the level of the caudal aspect of the thoracic aorta. Limited abdominal aortogram was performed demonstrating patency of the abdominal aorta with the 5 French catheter in place and as such abdominal aortic angioplasty would not be necessary and the decision was made to downsized the vascular sheath a 5 [place]. Again, closure arteriogram was performed demonstrating preferential flow through the right internal iliac artery with atretic flow surrounding the vascular sheath. Given patient's critical state the decision was made to proceed with the mesenteric arteriogram and embolization in an expeditious manner in lieu of abandoning the femoral access for a brachial approach. Over a Bentson wire, a Mickelson catheter was advanced to the level of the thoracic aorta where it was back bled and flushed. The catheter was then utilized to select the celiac artery and a selective celiac arteriogram was performed The Mickelson catheter was then utilized to select the  left gastric artery and a selective left gastric arteriogram was performed. With the use of an  0.014 fathom microwire, a regular Renegade microcatheter was advanced into a distal tributary of the left gastric artery serving as the predominant arterial supply to the tangle of vessels regional to the endoscopy clip. Selective injection confirmed appropriate positioning. The distal aspect of the branch vessel was then percutaneously coil embolized with multiple overlapping 2 mm and 3 mm soft and regular interlock coils. The microcatheter was withdrawn into the central aspect of the left gastric artery and a post embolization arteriogram was performed. The microcatheter was removed and a completion left gastric arteriogram was performed via the Mickelson catheter Again, the Mickelson catheter was utilized to select the celiac artery and selective celiac arteriograms were performed in various obliquities. Images were reviewed and the arteriogram portion of the procedure was terminated. Completion closure arteriogram was performed. All wires, catheters and sheaths were removed from the patient. Hemostasis was achieved at the right groin access site with deployment of a ExoSeal closure device and manual compression. _________________________________________________________ Next, attention was made towards placement of the IVC filter. Under sterile condition and local anesthesia, right common femoral venous access was performed with ultrasound. An ultrasound image was saved and sent to PACS. Over a guidewire, the IVC filter delivery sheath and inner dilator were advanced into the IVC just above the IVC bifurcation. Contrast injection was performed for an IVC venogram. Through the delivery sheath, a retrievable Denali IVC filter was deployed below the level of the renal veins and above the IVC bifurcation. Limited post deployment venacavagram was performed. The delivery sheath was removed and hemostasis was obtained with  manual compression. A dressing was placed. The patient tolerated the procedure well without immediate post procedural complication. FINDINGS: Selective celiac arteriogram demonstrates non conventional branching pattern without supply to the left gastric artery or significant supply to the gastric fundus, as was demonstrated on preceding CTA. As such, the accessory left gastric artery arising directly from the abdominal aorta was selected with sub selective arteriogram demonstrating a tangle of blood vessels contributing arterial supply to the portion of the stomach containing the endoscopy clip. Caudal division of the accessory left gastric artery supplies the dominant arterial contribution to the portion of the stomach containing the endoscopy clip and as such, was percutaneously coil embolized with multiple overlapping 2 and 3 mm interlock coils to near the vessel's origin. Completion arteriogram demonstrates a technically excellent result with no definitive persistent arterial supply regional to the endoscopy clip. Following the percutaneous coil embolization, several additional selective arteriograms were performed in various obliquities, again confirming no definitive supply to the gastric fundus. As such, additional embolization was not performed. The IVC is patent. No evidence of thrombus, stenosis, or occlusion. No variant venous anatomy. Successful placement of the IVC filter below the level of the renal veins. IMPRESSION: 1. Technically successful percutaneous coil embolization of the caudal division of the accessory left gastric artery serving as the predominant arterial supply to the portion of the stomach containing the endoscopy clip. 2. Selective celiac arteriogram fails to delineate significant arterial supply to the gastric fundus and in particular, to the portion of stomach regional to the endoscopy clip. 3. Successful ultrasound and fluoroscopically guided placement of an infrarenal retrievable  IVC filter. PLAN: - Patient is to lie flat for 4 hours. Continued aggressive resuscitation as per the critical care team. Note, as the patient has experienced an acute large bleeding episode, I would expect the patient's H and H has not yet reached a nadir but hopefully will stabilize in the next 24-48  hours. - The IVC filter is potentially retrievable. The patient will be assessed for filter retrieval by Interventional Radiology in approximately 8-12 weeks. Further recommendations regarding filter retrieval, continued surveillance or declaration of device permanence, will be made at that time. Electronically Signed   By: Sandi Mariscal M.D.   On: 04/12/2017 07:04   Ir Angiogram Selective Each Additional Vessel  Result Date: 04/12/2017 INDICATION: History of recent upper GI bleed, post endoscopy approximately 1 week ago resulting in placement of an endoscopy clip at the location of a bleeding gastric ulcer. Patient subsequently found to have a lower extremity DVT and was started on anti coagulation (patient has received 2 doses of Xarelto). Unfortunately, this resulted in a recurrent large upper GI bleed. Repeat upper endoscopy demonstrates a large amount of blood products within the stomach with inability to achieve hemostasis from endoscopic approach. As such, request made for emergent mesenteric arteriogram and potential percutaneous coil embolization as well as placement of an IVC filter for temporary caval interruption purposes. EXAM: 1. ULTRASOUND GUIDANCE FOR VENOUS AND ARTERIAL ACCESS 2. SELECTIVE CELIAC ARTERIOGRAM. 3. SELECTIVE ACCESSORY LEFT GASTRIC ARTERIOGRAM. 4. SELECTIVE ARTERIOGRAM OF DISTAL TRIBUTARY OF THE LEFT GASTRIC ARTERY (3rd ORDER) AND PERCUTANEOUS COIL EMBOLIZATION 5. IVC CATHETERIZATION AND VENOGRAM 6. IVC FILTER INSERTION COMPARISON:  CTA of the abdomen pelvis - 04/03/2017; chest CT - 04/07/2017 MEDICATIONS: None. ANESTHESIA/SEDATION: Versed 58 mg IV Sedation Time: minutes; The patient was  continuously monitored during the procedure by the interventional radiology nurse under my direct supervision. CONTRAST:  75 cc Isovue 300 FLUOROSCOPY TIME:  9 Minutes 24 seconds (614 mGy) COMPLICATIONS: None immediate. PROCEDURE: Informed written consent was obtained from the patient's brother following explanation of the procedure, risks, benefits and alternatives. A time out was performed prior to the initiation of the procedure. The right groin was prepped and draped in usual sterile fashion. Maximal barrier sterile technique utilized including caps, mask, sterile gowns, sterile gloves, large sterile drape, hand hygiene, and chlorhexidine prep. The right femoral head was marked fluoroscopically. Under ultrasound guidance, the right common femoral artery was accessed with a micropuncture kit after the overlying soft tissues were anesthetized with 1% lidocaine. An ultrasound image was saved for documentation purposes. The micropuncture sheath was exchanged for a 6 [place] vascular sheath over a Bentson wire. (Note, a 6 [place] vascular sheath was selected for access given suspected hemodynamically significant stenosis within the mid aspect of the abdominal aorta and potential necessity for angioplasty.) A closure arteriogram was performed through the side of the sheath confirming access within the right common femoral artery, however the 6 [place] vascular sheath was noted to be near occlusive secondary to a combination of atherosclerotic plaque as well as the vasospasm. With the use of a Kumpe catheter, a Bentson wire was advanced to the level of the caudal aspect of the thoracic aorta. Limited abdominal aortogram was performed demonstrating patency of the abdominal aorta with the 5 French catheter in place and as such abdominal aortic angioplasty would not be necessary and the decision was made to downsized the vascular sheath a 5 [place]. Again, closure arteriogram was performed demonstrating preferential flow  through the right internal iliac artery with atretic flow surrounding the vascular sheath. Given patient's critical state the decision was made to proceed with the mesenteric arteriogram and embolization in an expeditious manner in lieu of abandoning the femoral access for a brachial approach. Over a Bentson wire, a Mickelson catheter was advanced to the level of the thoracic aorta where it  was back bled and flushed. The catheter was then utilized to select the celiac artery and a selective celiac arteriogram was performed The Mickelson catheter was then utilized to select the left gastric artery and a selective left gastric arteriogram was performed. With the use of an 0.014 fathom microwire, a regular Renegade microcatheter was advanced into a distal tributary of the left gastric artery serving as the predominant arterial supply to the tangle of vessels regional to the endoscopy clip. Selective injection confirmed appropriate positioning. The distal aspect of the branch vessel was then percutaneously coil embolized with multiple overlapping 2 mm and 3 mm soft and regular interlock coils. The microcatheter was withdrawn into the central aspect of the left gastric artery and a post embolization arteriogram was performed. The microcatheter was removed and a completion left gastric arteriogram was performed via the Mickelson catheter Again, the Mickelson catheter was utilized to select the celiac artery and selective celiac arteriograms were performed in various obliquities. Images were reviewed and the arteriogram portion of the procedure was terminated. Completion closure arteriogram was performed. All wires, catheters and sheaths were removed from the patient. Hemostasis was achieved at the right groin access site with deployment of a ExoSeal closure device and manual compression. _________________________________________________________ Next, attention was made towards placement of the IVC filter. Under sterile  condition and local anesthesia, right common femoral venous access was performed with ultrasound. An ultrasound image was saved and sent to PACS. Over a guidewire, the IVC filter delivery sheath and inner dilator were advanced into the IVC just above the IVC bifurcation. Contrast injection was performed for an IVC venogram. Through the delivery sheath, a retrievable Denali IVC filter was deployed below the level of the renal veins and above the IVC bifurcation. Limited post deployment venacavagram was performed. The delivery sheath was removed and hemostasis was obtained with manual compression. A dressing was placed. The patient tolerated the procedure well without immediate post procedural complication. FINDINGS: Selective celiac arteriogram demonstrates non conventional branching pattern without supply to the left gastric artery or significant supply to the gastric fundus, as was demonstrated on preceding CTA. As such, the accessory left gastric artery arising directly from the abdominal aorta was selected with sub selective arteriogram demonstrating a tangle of blood vessels contributing arterial supply to the portion of the stomach containing the endoscopy clip. Caudal division of the accessory left gastric artery supplies the dominant arterial contribution to the portion of the stomach containing the endoscopy clip and as such, was percutaneously coil embolized with multiple overlapping 2 and 3 mm interlock coils to near the vessel's origin. Completion arteriogram demonstrates a technically excellent result with no definitive persistent arterial supply regional to the endoscopy clip. Following the percutaneous coil embolization, several additional selective arteriograms were performed in various obliquities, again confirming no definitive supply to the gastric fundus. As such, additional embolization was not performed. The IVC is patent. No evidence of thrombus, stenosis, or occlusion. No variant venous  anatomy. Successful placement of the IVC filter below the level of the renal veins. IMPRESSION: 1. Technically successful percutaneous coil embolization of the caudal division of the accessory left gastric artery serving as the predominant arterial supply to the portion of the stomach containing the endoscopy clip. 2. Selective celiac arteriogram fails to delineate significant arterial supply to the gastric fundus and in particular, to the portion of stomach regional to the endoscopy clip. 3. Successful ultrasound and fluoroscopically guided placement of an infrarenal retrievable IVC filter. PLAN: - Patient is to  lie flat for 4 hours. Continued aggressive resuscitation as per the critical care team. Note, as the patient has experienced an acute large bleeding episode, I would expect the patient's H and H has not yet reached a nadir but hopefully will stabilize in the next 24-48 hours. - The IVC filter is potentially retrievable. The patient will be assessed for filter retrieval by Interventional Radiology in approximately 8-12 weeks. Further recommendations regarding filter retrieval, continued surveillance or declaration of device permanence, will be made at that time. Electronically Signed   By: Sandi Mariscal M.D.   On: 04/12/2017 07:04   Ir Ivc Filter Plmt / S&i /img Guid/mod Sed  Result Date: 04/12/2017 INDICATION: History of recent upper GI bleed, post endoscopy approximately 1 week ago resulting in placement of an endoscopy clip at the location of a bleeding gastric ulcer. Patient subsequently found to have a lower extremity DVT and was started on anti coagulation (patient has received 2 doses of Xarelto). Unfortunately, this resulted in a recurrent large upper GI bleed. Repeat upper endoscopy demonstrates a large amount of blood products within the stomach with inability to achieve hemostasis from endoscopic approach. As such, request made for emergent mesenteric arteriogram and potential percutaneous coil  embolization as well as placement of an IVC filter for temporary caval interruption purposes. EXAM: 1. ULTRASOUND GUIDANCE FOR VENOUS AND ARTERIAL ACCESS 2. SELECTIVE CELIAC ARTERIOGRAM. 3. SELECTIVE ACCESSORY LEFT GASTRIC ARTERIOGRAM. 4. SELECTIVE ARTERIOGRAM OF DISTAL TRIBUTARY OF THE LEFT GASTRIC ARTERY (3rd ORDER) AND PERCUTANEOUS COIL EMBOLIZATION 5. IVC CATHETERIZATION AND VENOGRAM 6. IVC FILTER INSERTION COMPARISON:  CTA of the abdomen pelvis - 04/03/2017; chest CT - 04/07/2017 MEDICATIONS: None. ANESTHESIA/SEDATION: Versed 58 mg IV Sedation Time: minutes; The patient was continuously monitored during the procedure by the interventional radiology nurse under my direct supervision. CONTRAST:  75 cc Isovue 300 FLUOROSCOPY TIME:  9 Minutes 24 seconds (916 mGy) COMPLICATIONS: None immediate. PROCEDURE: Informed written consent was obtained from the patient's brother following explanation of the procedure, risks, benefits and alternatives. A time out was performed prior to the initiation of the procedure. The right groin was prepped and draped in usual sterile fashion. Maximal barrier sterile technique utilized including caps, mask, sterile gowns, sterile gloves, large sterile drape, hand hygiene, and chlorhexidine prep. The right femoral head was marked fluoroscopically. Under ultrasound guidance, the right common femoral artery was accessed with a micropuncture kit after the overlying soft tissues were anesthetized with 1% lidocaine. An ultrasound image was saved for documentation purposes. The micropuncture sheath was exchanged for a 6 [place] vascular sheath over a Bentson wire. (Note, a 6 [place] vascular sheath was selected for access given suspected hemodynamically significant stenosis within the mid aspect of the abdominal aorta and potential necessity for angioplasty.) A closure arteriogram was performed through the side of the sheath confirming access within the right common femoral artery, however the 6  [place] vascular sheath was noted to be near occlusive secondary to a combination of atherosclerotic plaque as well as the vasospasm. With the use of a Kumpe catheter, a Bentson wire was advanced to the level of the caudal aspect of the thoracic aorta. Limited abdominal aortogram was performed demonstrating patency of the abdominal aorta with the 5 French catheter in place and as such abdominal aortic angioplasty would not be necessary and the decision was made to downsized the vascular sheath a 5 [place]. Again, closure arteriogram was performed demonstrating preferential flow through the right internal iliac artery with atretic flow surrounding the vascular  sheath. Given patient's critical state the decision was made to proceed with the mesenteric arteriogram and embolization in an expeditious manner in lieu of abandoning the femoral access for a brachial approach. Over a Bentson wire, a Mickelson catheter was advanced to the level of the thoracic aorta where it was back bled and flushed. The catheter was then utilized to select the celiac artery and a selective celiac arteriogram was performed The Mickelson catheter was then utilized to select the left gastric artery and a selective left gastric arteriogram was performed. With the use of an 0.014 fathom microwire, a regular Renegade microcatheter was advanced into a distal tributary of the left gastric artery serving as the predominant arterial supply to the tangle of vessels regional to the endoscopy clip. Selective injection confirmed appropriate positioning. The distal aspect of the branch vessel was then percutaneously coil embolized with multiple overlapping 2 mm and 3 mm soft and regular interlock coils. The microcatheter was withdrawn into the central aspect of the left gastric artery and a post embolization arteriogram was performed. The microcatheter was removed and a completion left gastric arteriogram was performed via the Mickelson catheter Again, the  Mickelson catheter was utilized to select the celiac artery and selective celiac arteriograms were performed in various obliquities. Images were reviewed and the arteriogram portion of the procedure was terminated. Completion closure arteriogram was performed. All wires, catheters and sheaths were removed from the patient. Hemostasis was achieved at the right groin access site with deployment of a ExoSeal closure device and manual compression. _________________________________________________________ Next, attention was made towards placement of the IVC filter. Under sterile condition and local anesthesia, right common femoral venous access was performed with ultrasound. An ultrasound image was saved and sent to PACS. Over a guidewire, the IVC filter delivery sheath and inner dilator were advanced into the IVC just above the IVC bifurcation. Contrast injection was performed for an IVC venogram. Through the delivery sheath, a retrievable Denali IVC filter was deployed below the level of the renal veins and above the IVC bifurcation. Limited post deployment venacavagram was performed. The delivery sheath was removed and hemostasis was obtained with manual compression. A dressing was placed. The patient tolerated the procedure well without immediate post procedural complication. FINDINGS: Selective celiac arteriogram demonstrates non conventional branching pattern without supply to the left gastric artery or significant supply to the gastric fundus, as was demonstrated on preceding CTA. As such, the accessory left gastric artery arising directly from the abdominal aorta was selected with sub selective arteriogram demonstrating a tangle of blood vessels contributing arterial supply to the portion of the stomach containing the endoscopy clip. Caudal division of the accessory left gastric artery supplies the dominant arterial contribution to the portion of the stomach containing the endoscopy clip and as such, was  percutaneously coil embolized with multiple overlapping 2 and 3 mm interlock coils to near the vessel's origin. Completion arteriogram demonstrates a technically excellent result with no definitive persistent arterial supply regional to the endoscopy clip. Following the percutaneous coil embolization, several additional selective arteriograms were performed in various obliquities, again confirming no definitive supply to the gastric fundus. As such, additional embolization was not performed. The IVC is patent. No evidence of thrombus, stenosis, or occlusion. No variant venous anatomy. Successful placement of the IVC filter below the level of the renal veins. IMPRESSION: 1. Technically successful percutaneous coil embolization of the caudal division of the accessory left gastric artery serving as the predominant arterial supply to the portion of the  stomach containing the endoscopy clip. 2. Selective celiac arteriogram fails to delineate significant arterial supply to the gastric fundus and in particular, to the portion of stomach regional to the endoscopy clip. 3. Successful ultrasound and fluoroscopically guided placement of an infrarenal retrievable IVC filter. PLAN: - Patient is to lie flat for 4 hours. Continued aggressive resuscitation as per the critical care team. Note, as the patient has experienced an acute large bleeding episode, I would expect the patient's H and H has not yet reached a nadir but hopefully will stabilize in the next 24-48 hours. - The IVC filter is potentially retrievable. The patient will be assessed for filter retrieval by Interventional Radiology in approximately 8-12 weeks. Further recommendations regarding filter retrieval, continued surveillance or declaration of device permanence, will be made at that time. Electronically Signed   By: Sandi Mariscal M.D.   On: 04/12/2017 07:04   Ir US Guide Vasc Access Right  Result Date: 04/12/2017 INDICATION: History of recent upper GI bleed,  post endoscopy approximately 1 week ago resulting in placement of an endoscopy clip at the location of a bleeding gastric ulcer. Patient subsequently found to have a lower extremity DVT and was started on anti coagulation (patient has received 2 doses of Xarelto). Unfortunately, this resulted in a recurrent large upper GI bleed. Repeat upper endoscopy demonstrates a large amount of blood products within the stomach with inability to achieve hemostasis from endoscopic approach. As such, request made for emergent mesenteric arteriogram and potential percutaneous coil embolization as well as placement of an IVC filter for temporary caval interruption purposes. EXAM: 1. ULTRASOUND GUIDANCE FOR VENOUS AND ARTERIAL ACCESS 2. SELECTIVE CELIAC ARTERIOGRAM. 3. SELECTIVE ACCESSORY LEFT GASTRIC ARTERIOGRAM. 4. SELECTIVE ARTERIOGRAM OF DISTAL TRIBUTARY OF THE LEFT GASTRIC ARTERY (3rd ORDER) AND PERCUTANEOUS COIL EMBOLIZATION 5. IVC CATHETERIZATION AND VENOGRAM 6. IVC FILTER INSERTION COMPARISON:  CTA of the abdomen pelvis - 04/03/2017; chest CT - 04/07/2017 MEDICATIONS: None. ANESTHESIA/SEDATION: Versed 58 mg IV Sedation Time: minutes; The patient was continuously monitored during the procedure by the interventional radiology nurse under my direct supervision. CONTRAST:  75 cc Isovue 300 FLUOROSCOPY TIME:  9 Minutes 24 seconds (993 mGy) COMPLICATIONS: None immediate. PROCEDURE: Informed written consent was obtained from the patient's brother following explanation of the procedure, risks, benefits and alternatives. A time out was performed prior to the initiation of the procedure. The right groin was prepped and draped in usual sterile fashion. Maximal barrier sterile technique utilized including caps, mask, sterile gowns, sterile gloves, large sterile drape, hand hygiene, and chlorhexidine prep. The right femoral head was marked fluoroscopically. Under ultrasound guidance, the right common femoral artery was accessed with a  micropuncture kit after the overlying soft tissues were anesthetized with 1% lidocaine. An ultrasound image was saved for documentation purposes. The micropuncture sheath was exchanged for a 6 [place] vascular sheath over a Bentson wire. (Note, a 6 [place] vascular sheath was selected for access given suspected hemodynamically significant stenosis within the mid aspect of the abdominal aorta and potential necessity for angioplasty.) A closure arteriogram was performed through the side of the sheath confirming access within the right common femoral artery, however the 6 [place] vascular sheath was noted to be near occlusive secondary to a combination of atherosclerotic plaque as well as the vasospasm. With the use of a Kumpe catheter, a Bentson wire was advanced to the level of the caudal aspect of the thoracic aorta. Limited abdominal aortogram was performed demonstrating patency of the abdominal aorta with the 5  French catheter in place and as such abdominal aortic angioplasty would not be necessary and the decision was made to downsized the vascular sheath a 5 [place]. Again, closure arteriogram was performed demonstrating preferential flow through the right internal iliac artery with atretic flow surrounding the vascular sheath. Given patient's critical state the decision was made to proceed with the mesenteric arteriogram and embolization in an expeditious manner in lieu of abandoning the femoral access for a brachial approach. Over a Bentson wire, a Mickelson catheter was advanced to the level of the thoracic aorta where it was back bled and flushed. The catheter was then utilized to select the celiac artery and a selective celiac arteriogram was performed The Mickelson catheter was then utilized to select the left gastric artery and a selective left gastric arteriogram was performed. With the use of an 0.014 fathom microwire, a regular Renegade microcatheter was advanced into a distal tributary of the left gastric  artery serving as the predominant arterial supply to the tangle of vessels regional to the endoscopy clip. Selective injection confirmed appropriate positioning. The distal aspect of the branch vessel was then percutaneously coil embolized with multiple overlapping 2 mm and 3 mm soft and regular interlock coils. The microcatheter was withdrawn into the central aspect of the left gastric artery and a post embolization arteriogram was performed. The microcatheter was removed and a completion left gastric arteriogram was performed via the Mickelson catheter Again, the Mickelson catheter was utilized to select the celiac artery and selective celiac arteriograms were performed in various obliquities. Images were reviewed and the arteriogram portion of the procedure was terminated. Completion closure arteriogram was performed. All wires, catheters and sheaths were removed from the patient. Hemostasis was achieved at the right groin access site with deployment of a ExoSeal closure device and manual compression. _________________________________________________________ Next, attention was made towards placement of the IVC filter. Under sterile condition and local anesthesia, right common femoral venous access was performed with ultrasound. An ultrasound image was saved and sent to PACS. Over a guidewire, the IVC filter delivery sheath and inner dilator were advanced into the IVC just above the IVC bifurcation. Contrast injection was performed for an IVC venogram. Through the delivery sheath, a retrievable Denali IVC filter was deployed below the level of the renal veins and above the IVC bifurcation. Limited post deployment venacavagram was performed. The delivery sheath was removed and hemostasis was obtained with manual compression. A dressing was placed. The patient tolerated the procedure well without immediate post procedural complication. FINDINGS: Selective celiac arteriogram demonstrates non conventional branching  pattern without supply to the left gastric artery or significant supply to the gastric fundus, as was demonstrated on preceding CTA. As such, the accessory left gastric artery arising directly from the abdominal aorta was selected with sub selective arteriogram demonstrating a tangle of blood vessels contributing arterial supply to the portion of the stomach containing the endoscopy clip. Caudal division of the accessory left gastric artery supplies the dominant arterial contribution to the portion of the stomach containing the endoscopy clip and as such, was percutaneously coil embolized with multiple overlapping 2 and 3 mm interlock coils to near the vessel's origin. Completion arteriogram demonstrates a technically excellent result with no definitive persistent arterial supply regional to the endoscopy clip. Following the percutaneous coil embolization, several additional selective arteriograms were performed in various obliquities, again confirming no definitive supply to the gastric fundus. As such, additional embolization was not performed. The IVC is patent. No evidence of thrombus, stenosis,  or occlusion. No variant venous anatomy. Successful placement of the IVC filter below the level of the renal veins. IMPRESSION: 1. Technically successful percutaneous coil embolization of the caudal division of the accessory left gastric artery serving as the predominant arterial supply to the portion of the stomach containing the endoscopy clip. 2. Selective celiac arteriogram fails to delineate significant arterial supply to the gastric fundus and in particular, to the portion of stomach regional to the endoscopy clip. 3. Successful ultrasound and fluoroscopically guided placement of an infrarenal retrievable IVC filter. PLAN: - Patient is to lie flat for 4 hours. Continued aggressive resuscitation as per the critical care team. Note, as the patient has experienced an acute large bleeding episode, I would expect the  patient's H and H has not yet reached a nadir but hopefully will stabilize in the next 24-48 hours. - The IVC filter is potentially retrievable. The patient will be assessed for filter retrieval by Interventional Radiology in approximately 8-12 weeks. Further recommendations regarding filter retrieval, continued surveillance or declaration of device permanence, will be made at that time. Electronically Signed   By: Sandi Mariscal M.D.   On: 04/12/2017 07:04   Dg Chest Port 1 View  Result Date: 04/13/2017 CLINICAL DATA:  Respiratory failure EXAM: PORTABLE CHEST 1 VIEW COMPARISON:  Portable chest x-ray of April 12, 2017 FINDINGS: The lungs are reasonably well inflated. There is persistent increased density at both lung bases greatest on the left. The heart is normal in size. The pulmonary vascularity is less engorged today. The endotracheal tube tip projects 4.5 cm above the carina. The left internal jugular venous catheter tip projects at the junction of the right and left brachiocephalic veins. IMPRESSION: Decreased pulmonary edema. Persistent bibasilar subsegmental atelectasis or pneumonia greatest on the left. Electronically Signed   By: David  Martinique M.D.   On: 04/13/2017 07:00   Dg Chest Port 1 View  Result Date: 04/12/2017 CLINICAL DATA:  Central line placement EXAM: PORTABLE CHEST 1 VIEW COMPARISON:  04/12/2017 FINDINGS: Endotracheal tube tip measures 4.7 cm above the carina. A left jugular central venous catheter has been placed. Tip is over the mid mediastinum consistent with location in the brachiocephalic vein. No pneumothorax. Mild cardiac enlargement. Developing pulmonary vascular congestion with developing perihilar infiltration suggesting edema. No blunting of costophrenic angles. IMPRESSION: Left central venous catheter tip projects over the brachiocephalic vein. No pneumothorax. Developing cardiac enlargement with perihilar edema. Electronically Signed   By: Lucienne Capers M.D.   On:  04/12/2017 04:05   Dg Chest Port 1 View  Result Date: 04/12/2017 CLINICAL DATA:  Intubation EXAM: PORTABLE CHEST 1 VIEW COMPARISON:  CT chest 04/07/2017.  Chest 04/03/2017 FINDINGS: Shallow inspiration. Heart size and pulmonary vascularity are normal. 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 An endotracheal tube is been placed with tip measuring 4.8 cm above the carina. Lungs are clear. No blunting of costophrenic angles. No pneumothorax. IMPRESSION: Endotracheal tube placed with tip measuring 4.8 cm above the carina. Lungs are clear. Electronically Signed   By: Lucienne Capers M.D.   On: 04/12/2017 01:22   [place] Guide Roadmapping  Result Date: 04/12/2017 INDICATION: History of recent upper GI bleed, post endoscopy approximately 1 week ago resulting in placement of an endoscopy clip at the location of a bleeding gastric ulcer. Patient subsequently found to have a lower extremity DVT and was started on anti coagulation (patient has received 2 doses of Xarelto).  Unfortunately, this resulted in a recurrent large upper GI bleed. Repeat upper endoscopy demonstrates a large amount of blood products within the stomach with inability to achieve hemostasis from endoscopic approach. As such, request made for emergent mesenteric arteriogram and potential percutaneous coil embolization as well as placement of an IVC filter for temporary caval interruption purposes. EXAM: 1. ULTRASOUND GUIDANCE FOR VENOUS AND ARTERIAL ACCESS 2. SELECTIVE CELIAC ARTERIOGRAM. 3. SELECTIVE ACCESSORY LEFT GASTRIC ARTERIOGRAM. 4. SELECTIVE ARTERIOGRAM OF DISTAL TRIBUTARY OF THE LEFT GASTRIC ARTERY (3rd ORDER) AND PERCUTANEOUS COIL EMBOLIZATION 5. IVC CATHETERIZATION AND VENOGRAM 6. IVC FILTER INSERTION COMPARISON:  CTA of the abdomen pelvis - 04/03/2017; chest CT - 04/07/2017 MEDICATIONS: None. ANESTHESIA/SEDATION: Versed 58 mg IV Sedation Time: minutes; The patient was  continuously monitored during the procedure by the interventional radiology nurse under my direct supervision. CONTRAST:  75 cc Isovue 300 FLUOROSCOPY TIME:  9 Minutes 24 seconds (400 mGy) COMPLICATIONS: None immediate. PROCEDURE: Informed written consent was obtained from the patient's brother following explanation of the procedure, risks, benefits and alternatives. A time out was performed prior to the initiation of the procedure. The right groin was prepped and draped in usual sterile fashion. Maximal barrier sterile technique utilized including caps, mask, sterile gowns, sterile gloves, large sterile drape, hand hygiene, and chlorhexidine prep. The right femoral head was marked fluoroscopically. Under ultrasound guidance, the right common femoral artery was accessed with a micropuncture kit after the overlying soft tissues were anesthetized with 1% lidocaine. An ultrasound image was saved for documentation purposes. The micropuncture sheath was exchanged for a 6 [place] vascular sheath over a Bentson wire. (Note, a 6 [place] vascular sheath was selected for access given suspected hemodynamically significant stenosis within the mid aspect of the abdominal aorta and potential necessity for angioplasty.) A closure arteriogram was performed through the side of the sheath confirming access within the right common femoral artery, however the 6 [place] vascular sheath was noted to be near occlusive secondary to a combination of atherosclerotic plaque as well as the vasospasm. With the use of a Kumpe catheter, a Bentson wire was advanced to the level of the caudal aspect of the thoracic aorta. Limited abdominal aortogram was performed demonstrating patency of the abdominal aorta with the 5 French catheter in place and as such abdominal aortic angioplasty would not be necessary and the decision was made to downsized the vascular sheath a 5 [place]. Again, closure arteriogram was performed demonstrating preferential flow  through the right internal iliac artery with atretic flow surrounding the vascular sheath. Given patient's critical state the decision was made to proceed with the mesenteric arteriogram and embolization in an expeditious manner in lieu of abandoning the femoral access for a brachial approach. Over a Bentson wire, a Mickelson catheter was advanced to the level of the thoracic aorta where it was back bled and flushed. The catheter was then utilized to select the celiac artery and a selective celiac arteriogram was performed The Mickelson catheter was then utilized to select the left gastric artery and a selective left gastric arteriogram was performed. With the use of an 0.014 fathom microwire, a regular Renegade microcatheter was advanced into a distal tributary of the left gastric artery serving as the predominant arterial supply to the tangle of vessels regional to the endoscopy clip. Selective injection confirmed appropriate positioning. The distal aspect of the branch vessel was then percutaneously coil embolized with multiple overlapping 2 mm and 3 mm soft and regular interlock coils. The microcatheter was withdrawn into the  central aspect of the left gastric artery and a post embolization arteriogram was performed. The microcatheter was removed and a completion left gastric arteriogram was performed via the Mickelson catheter Again, the Mickelson catheter was utilized to select the celiac artery and selective celiac arteriograms were performed in various obliquities. Images were reviewed and the arteriogram portion of the procedure was terminated. Completion closure arteriogram was performed. All wires, catheters and sheaths were removed from the patient. Hemostasis was achieved at the right groin access site with deployment of a ExoSeal closure device and manual compression. _________________________________________________________ Next, attention was made towards placement of the IVC filter. Under sterile  condition and local anesthesia, right common femoral venous access was performed with ultrasound. An ultrasound image was saved and sent to PACS. Over a guidewire, the IVC filter delivery sheath and inner dilator were advanced into the IVC just above the IVC bifurcation. Contrast injection was performed for an IVC venogram. Through the delivery sheath, a retrievable Denali IVC filter was deployed below the level of the renal veins and above the IVC bifurcation. Limited post deployment venacavagram was performed. The delivery sheath was removed and hemostasis was obtained with manual compression. A dressing was placed. The patient tolerated the procedure well without immediate post procedural complication. FINDINGS: Selective celiac arteriogram demonstrates non conventional branching pattern without supply to the left gastric artery or significant supply to the gastric fundus, as was demonstrated on preceding CTA. As such, the accessory left gastric artery arising directly from the abdominal aorta was selected with sub selective arteriogram demonstrating a tangle of blood vessels contributing arterial supply to the portion of the stomach containing the endoscopy clip. Caudal division of the accessory left gastric artery supplies the dominant arterial contribution to the portion of the stomach containing the endoscopy clip and as such, was percutaneously coil embolized with multiple overlapping 2 and 3 mm interlock coils to near the vessel's origin. Completion arteriogram demonstrates a technically excellent result with no definitive persistent arterial supply regional to the endoscopy clip. Following the percutaneous coil embolization, several additional selective arteriograms were performed in various obliquities, again confirming no definitive supply to the gastric fundus. As such, additional embolization was not performed. The IVC is patent. No evidence of thrombus, stenosis, or occlusion. No variant venous  anatomy. Successful placement of the IVC filter below the level of the renal veins. IMPRESSION: 1. Technically successful percutaneous coil embolization of the caudal division of the accessory left gastric artery serving as the predominant arterial supply to the portion of the stomach containing the endoscopy clip. 2. Selective celiac arteriogram fails to delineate significant arterial supply to the gastric fundus and in particular, to the portion of stomach regional to the endoscopy clip. 3. Successful ultrasound and fluoroscopically guided placement of an infrarenal retrievable IVC filter. PLAN: - Patient is to lie flat for 4 hours. Continued aggressive resuscitation as per the critical care team. Note, as the patient has experienced an acute large bleeding episode, I would expect the patient's H and H has not yet reached a nadir but hopefully will stabilize in the next 24-48 hours. - The IVC filter is potentially retrievable. The patient will be assessed for filter retrieval by Interventional Radiology in approximately 8-12 weeks. Further recommendations regarding filter retrieval, continued surveillance or declaration of device permanence, will be made at that time. Electronically Signed   By: Sandi Mariscal M.D.   On: 04/12/2017 07:04    Labs:  CBC: Recent Labs    04/10/17 0715 04/11/17 4782  04/12/17 0412  04/12/17 1723 04/12/17 2000 04/12/17 2339 04/13/17 0346  WBC 10.3 8.8  --  35.2*  --   --   --   --  23.5*  HGB 8.4* 7.8*   < > 9.6*   < > 5.5* 9.2* 7.8* 4.5*  HCT 25.3* 24.0*   < > 28.3*   < > 15.8* 25.8* 22.0* 13.0*  PLT 166 176  --  72*  --   --   --   --  66*   < > = values in this interval not displayed.    COAGS: Recent Labs    04/02/17 1459 04/02/17 1638 04/03/17 0409 04/13/17 0926  INR 1.14  --  1.10 1.97  APTT  --  27 25  --     BMP: Recent Labs    04/12/17 0342 04/12/17 0358 04/12/17 1107 04/12/17 2000 04/13/17 0346  NA 149* 150* 146* 148* 145  K 4.0 3.7 3.3*  3.0* 3.9  CL 116* 108 106 113* 105  CO2 20*  --  '26 28 28  '$ GLUCOSE 232* 213* 166* 107* 223*  BUN '12 10 11 9 12  '$ CALCIUM 6.3*  --  6.7* 6.0* 6.4*  CREATININE 1.16* 0.80 1.15* 1.09* 1.46*  GFRNONAA 49*  --  49* 52* 37*  GFRAA 56*  --  57* >60 43*    LIVER FUNCTION TESTS: Recent Labs    04/02/17 1459 04/05/17 1504 04/07/17 1006 04/08/17 0248  BILITOT 0.7 0.5 0.3 0.8  AST 106* 47* 24 20  ALT 162* 70* 40 33  ALKPHOS 118 64 51 42  PROT 5.2* 5.2* 4.4* 4.0*  ALBUMIN 2.1* 2.2* 1.9* 1.9*    Assessment and Plan: GI bleed s/p embolization procedure and IVC filter placement 2/3 Groin soft this AM.  Some blood in OGT.  Reviewed imaging with Dr. Laurence Ferrari who questions possible continued bleeding.  She has had a further drop in Hgb and continues to require pressors for support.  Spoke with CCM team who agree to pursue repeat angiogram with possible embolization.  Renal failure is of great concern given increased SCr since angiogram 2/3.  PA has attempted to contact Scherrie Bateman multiple times for consent today with no answer.  Will continue to work towards procedure once able to obtain consent.   Electronically Signed: Docia Barrier, PA 04/13/2017, 12:06 PM   I spent a total of 15 Minutes at the the patient's bedside AND on the patient's hospital floor or unit, greater than 50% of which was counseling/coordinating care for GI bleed.

## 2017-04-13 NOTE — Progress Notes (Signed)
Chart reviewed and telemetry noted- sinus rhythm is present. Was apparently ordered for amiodarone initially, but not given. Cannot be anticoagulated due to recent GIB. Hemoglobin of 4.5 today? Significant decline - will need transfusion.   No further cardiac suggestions at this point. Can follow-up with Dr. Stanford Breed after discharge. Would not recommend initiating anticoagulation in the short term. May need to consider outpatient monitoring to determine if a-fib returns once the GIB has been controlled to determine if anticoagulation is necessary.  Will sign-off. Call with questions.  Pixie Casino, MD, [facility], [place] Director of the [facility] &  Card[facility]ovascular [facility] Reduction Clinic Diplomate of the American Board of Clinical Lipidology Attending Cardiologist  Direct Dial: [phone]  Fax: [phone]  Website:  [email or web address]

## 2017-04-13 NOTE — Sedation Documentation (Signed)
Vital signs stable. Transported pt back to 2M16 with RT and IR tech, bedside report given to [employer] and night nurse coming on. Assisted in cleaning pt. Groin site unremarkable.

## 2017-04-13 NOTE — Progress Notes (Signed)
[facility] Progress Note Patient Name: Kayla Wyatt DOB: [birth year] MRN: [id number]   Date of Service  04/13/2017  HPI/Events of Note  Hgb = 4.5.  eICU Interventions  Will transfuse 3 units of PRBC now.      Intervention Category Major Interventions: Other:  Mikinzie Maciejewski Cornelia Copa 04/13/2017, 4:35 AM

## 2017-04-13 NOTE — Progress Notes (Signed)
eLink Physician-Brief Progress Note Patient Name: Kayla Wyatt DOB: January 16, 1956 MRN: [id number]   Date of Service  04/13/2017  HPI/Events of Note  Multiple issues: 1. Lactic Acid = 5.8 - Likely related to Hgb =4.5 and should clear with transfusion, 2.  Ca++ = 6.4 and 3. A-line lost.  eICU Interventions  Will order: 1. Replace Ca++. 2. Respiratory Therapy to replace A-line.      Intervention Category Major Interventions: Acid-Base disturbance - evaluation and management;Electrolyte abnormality - evaluation and management  [name] 04/13/2017, 5:26 AM

## 2017-04-13 NOTE — Progress Notes (Signed)
[facility] PCCM  Attempted to contact brother, no answer at phone number listed.  Proceed with emergent angiography  Roselie Awkward, MD [place] PCCM Pager: [phone] Cell: [phone] After 3pm or if no response, call [phone]

## 2017-04-13 NOTE — Progress Notes (Signed)
[facility] PCCM  Discussed with pharmacy in setting of massive bleeding despite FFP, PLT infusion Given life threatening nature of bleed after having received K-centra on 2/3 will administer tranexamic acid; feel risks of thrombosis are outweighed by benefits.  Roselie Awkward, MD [place] PCCM Pager: [phone] Cell: [phone] After 3pm or if no response, call [phone]

## 2017-04-13 NOTE — Sedation Documentation (Signed)
Family updated as to patient's status.

## 2017-04-13 NOTE — Progress Notes (Signed)
PULMONARY / CRITICAL CARE MEDICINE   Name: Kayla Wyatt MRN: [id number] DOB: [birth year]    ADMISSION DATE:  04/02/2017 CONSULTATION DATE:  04/12/2017  REFERRING MD:  Dr. Shanon Brow   CHIEF COMPLAINT:  GI Bleed   HISTORY OF PRESENT ILLNESS:   62 year old female with PMH of HTN, HLD  Presented to ED On 1/24 with reported two days of ABD pain and bloody diarrhea. States she has had 15+ bowel movements in the morning of arrival. CT A/P with suspicion for ileus. GI consulted. EGD 1/25 with single visible vessel in the setting of a mild ulceration was found in the stomach in which was clipped. Patient continued to have anemia, hypotension, and melena. Was taken for colonoscopy on 1/31 in which revealed diverticulosis. 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 2/2 patient had hemoptysis and Xarelto was stopped, given 4 units RBCs. & 2 FFP . 2/3 in the early morning patient was found minimally responsive and hypotensive, requiring intubation and vent support. Transferred to ICU and GI called.   She was given Xarelto 4 factor [place] reversal agent at 12:45 on 2/3. 1 pack of platelets . 2 additional PRBC She was started on pressor support.   SUBJECTIVE:  S/p IR intervention with coli embolization of left gastric artery 2/4, and IVC filter placement.  Hgb this AM down to 4.5 (was 9.2 overnight, repeat later 7.8).  Seen by GI this AM, no plans for repeat procedure unless recurrent hematemesis.   VITAL SIGNS: BP 115/61 (BP Location: Left Arm)   Pulse 96   Temp 99.2 F (37.3 C) (Oral)   Resp 16   Ht 5\' 6"  (1.676 m)   Wt 84.3 kg (185 lb 13.6 oz)   SpO2 100%   BMI 30.00 kg/m   HEMODYNAMICS:    VENTILATOR SETTINGS: Vent Mode: PRVC FiO2 (%):  [40 %] 40 % Set Rate:  [18 bmp] 18 bmp Vt Set:  [420 mL] 420 mL PEEP:  [5 cmH20] 5 cmH20 Plateau  Pressure:  [13 cmH20-17 cmH20] 13 cmH20  INTAKE / OUTPUT: I/O last 3 completed shifts: In: 9211 [P.O.:120; I.V.:4797; HERDE:0814] Out: 1845 [GYJEH:6314; Emesis/NG output:100; Stool:575]  PHYSICAL EXAMINATION: General:  Elderly female, intubated Neuro:  Sedated, but arouses to voice, nods head in response to questions HEENT:  ETT in place, MMM Cardiovascular:  RRR , no M/R/G Lungs:  Clear breath sounds, no wheeze/crackles  Abdomen: Distended, hypoactive bowel sounds, soft, no ecchymosis or hematoma noted Musculoskeletal: Intact, no edema Skin:  Warm, dry, intact   LABS:  BMET Recent Labs  Lab 04/12/17 1107 04/12/17 2000 04/13/17 0346  NA 146* 148* 145  K 3.3* 3.0* 3.9  CL 106 113* 105  CO2 26 28 28   BUN 11 9 12   CREATININE 1.15* 1.09* 1.46*  GLUCOSE 166* 107* 223*    Electrolytes Recent Labs  Lab 04/10/17 0243  04/12/17 0342 04/12/17 1107 04/12/17 2000 04/13/17 0346  CALCIUM 7.7*   < > 6.3* 6.7* 6.0* 6.4*  MG 1.9  --  1.5*  --   --  2.0  PHOS  --   --  7.3*  --   --  5.0*   < > = values in this interval not displayed.    CBC Recent Labs  Lab 04/11/17 0328  04/12/17 9702  04/12/17 2000 04/12/17 2339  04/13/17 0346  WBC 8.8  --  35.2*  --   --   --  23.5*  HGB 7.8*   < > 9.6*   < > 9.2* 7.8* 4.5*  HCT 24.0*   < > 28.3*   < > 25.8* 22.0* 13.0*  PLT 176  --  72*  --   --   --  66*   < > = values in this interval not displayed.    Coag's No results for input(s): APTT, INR in the last 168 hours.  Sepsis Markers Recent Labs  Lab 04/06/17 0914 04/12/17 0412 04/12/17 1107 04/13/17 0346  LATICACIDVEN  --  8.7* 2.8* 5.9*  PROCALCITON 0.13  --   --   --     ABG Recent Labs  Lab 04/12/17 0942 04/12/17 1959 04/13/17 0330  PHART 7.328* 7.496* 7.545*  PCO2ART 55.7* 46.8 39.1  PO2ART 87.0 73.0* 106    Liver Enzymes Recent Labs  Lab 04/07/17 1006 04/08/17 0248  AST 24 20  ALT 40 33  ALKPHOS 51 42  BILITOT 0.3 0.8  ALBUMIN 1.9* 1.9*     Cardiac Enzymes No results for input(s): TROPONINI, PROBNP in the last 168 hours.  Glucose Recent Labs  Lab 04/12/17 0727 04/12/17 1243 04/12/17 1639 04/12/17 1916 04/13/17 0007 04/13/17 0328  GLUCAP 158* 125* 120* 131* 148* 183*    Imaging Dg Chest Port 1 View  Result Date: 04/13/2017 CLINICAL DATA:  Respiratory failure EXAM: PORTABLE CHEST 1 VIEW COMPARISON:  Portable chest x-ray of April 12, 2017 FINDINGS: The lungs are reasonably well inflated. There is persistent increased density at both lung bases greatest on the left. The heart is normal in size. The pulmonary vascularity is less engorged today. The endotracheal tube tip projects 4.5 cm above the carina. The left internal jugular venous catheter tip projects at the junction of the right and left brachiocephalic veins. IMPRESSION: Decreased pulmonary edema. Persistent bibasilar subsegmental atelectasis or pneumonia greatest on the left. Electronically Signed   By: David  Martinique M.D.   On: 04/13/2017 07:00     STUDIES:  CTA A/P 1/26 > Significant aortic atherosclerosis, with focal high-grade aortic stenosis secondary to mixed calcified and soft plaque, occurring at the level of the inferior mesenteric artery. If there is concern for short distance claudication, correlation with noninvasive lower extremity exam may be useful. Aortic Atherosclerosis (ICD10-I70.0). Atherosclerotic changes at the origin of the mesenteric arteries, however, only the IMA demonstrates evidence of high-grade stenosis, and there appears to be good collateral flow to the IMA distribution secondary to enlarged meandering mesenteric artery/arc of [place]. It is uncertain on the CTA as to what degree atherosclerotic changes/stenosis of the common hepatic artery are contributing to flow compromise. There are 2 right renal arteries and 3 left renal arteries. Atherosclerotic changes involve the origin of all of the renal arteries, with at least 50% stenosis  secondary to mixed calcified and soft plaque at the origin of the largest bilateral renal Arteries. ECHO 1/28 > The estimated ejection fraction was in the range of 55% to 60%. Wall motion was normal; there were no regional wall motion abnormalities. Features are consistent with a pseudonormal left ventricular filling pattern, with concomitant abnormal relaxation and increased filling pressure (grade 2 diastolic dysfunction). NM 1/29 > No active bleed  CTA Chest 1/29 > 1. 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 Recommend tissue sampling. 2. No pulmonary embolism seen, with mild study limitations  detailed Above. 3. Small right pleural effusion.  Mild bibasilar atelectasis. 4. Mild emphysematous changes within the lung apices VAS [place] Lower Venous 1/29 > DVT in Right Gastrocnemius  EGD 1/25 > - Normal esophagus. - A single visible vessel in the setting of a mild ulceration was found in the stomach. Clip (MR unsafe) was placed- Normal examined duodenum.- No specimens collected. Colonoscopy 1/31 >  Diverticulosis in the sigmoid colon.  CULTURES: MRSA 1/24 > Negative  Blood 1/26 > Negative  Urine Culture 1/27 > Negative  Blood 1/14 x 2 > E.coli  ANTIBIOTICS: Zosyn 1/24 > 1/26 Vancomycin 1/24 > 1/26 Rocephin 1/26 > 2/3  SIGNIFICANT EVENTS: 1/24 > Presents to ED   LINES/TUBES: ETT 2/3 >> PIV  [facility] 2/3 >>  DISCUSSION: 62 year old female presents with melena and ABD pain. EGD on 1/25 with single visible vessel in the setting of mild ulceration where clips was placed. Patient continued to have hypotension and bleeding. On 1/31 underwent colonoscopy in which revealed diverticulosis. During stay patient was found to have right LE DVT and started on Xarelto on 2/1. 2/2 patient had hemoptysis and Xarelto was stopped, given 4 units RBCs. 2/3 in the early morning patient was found minimally responsive and hypotensive,  requiring intubation. 2 u PRBC , 1 pk platelets and xarelto reversal agent given .  Transferred to ICU and GI called.     ASSESSMENT / PLAN:  PULMONARY A: Acute Respiratory Failure in setting of severe  GI Bleed /Shock   4.1 cm mass in the aortopulmonary window  P:   Vent Support  No SBT this AM due to hemodynamic status VAP Bundle  Trend CXR/ABG  Mass workup only once more stable   CARDIOVASCULAR A:  Hypotension in setting of Hemorraghic Shock  A.Fib RVR - resolved  Diastolic HF (G4WN EF 02-72)  H/O HTN, HLD  P:  Cardiac Monitoring  Cardiology Following  Continue levophed, vasopressin and titrate for MAP > 65  Repeat lactic acid this AM, if rising then will get CT abd / pelv to r/o retroperitoneal bleed Assess co-ox to evaluate if has concomitant cardiogenic cause for shock  RENAL A:   Anion Gap Metabolic Acidosis - resolved; however, lactate now climbing again AM 2/4 (up to 6, was 2.8 on 2/3) Hypokalemia - resolved Hypomagnesemia - resolved Hypernatremia - resolved  29 L + I/O balance  P:   Trend BMP Replace electrolytes as indicated  D/c bicarb gtt Strict I/O   GASTROINTESTINAL A:   UGIB - due to gastric ulcer, s/p clipping 1/25 and coil embolization by IR 2/3 Melena, Hemoptysis  Transaminitis > Improved  Uterine Prolapse   P:   GI Following, no plans for repeat procedure unless has recurrent hematemesis Might need CT abd / pelvis given persistent shock state and lactate, will assess what repeat lactic acid is this AM and then decide NPO PPI BID Trend H&H q6H   HEMATOLOGIC A:   Acute Blood Loss Anemia secondary to UGIB - s/p 13 units PRBC, 2 FFP, 1 platelets since admission RLE DVT - s/p IVC filter placement Thrombocytopenia  P:  Hold all anticoagulation (Xarelto started on 2/1, received 2 doses, stopped due to bleeding and also given Kcentra 2/3) Check coags now Transfuse platelets Trend CBC   INFECTIOUS A:   E.Coli bacteremia - s/p course of  ceftriaxone P:   Monitor clinically  ENDOCRINE A:   Hyperglycemia    P:   Trend Glucose  SSI   NEUROLOGIC A:  Metabolic Encephalopathy  P:   Fentanyl gtt / Versed PRN to achieve RASS RASS goal: 0/-1 WUA once hemodynamics improved  FAMILY  - Updates: Family updated 2/3.  None at bedside 2/4.  - Inter-disciplinary family meet or Palliative Care meeting due by: 04/19/2017    Montey Hora, [facility] Pager: [phone]  or [phone] 04/13/2017, 8:47 AM

## 2017-04-14 ENCOUNTER — Inpatient Hospital Stay (HOSPITAL_COMMUNITY): Payer: BLUE CROSS/BLUE SHIELD

## 2017-04-14 ENCOUNTER — Encounter (HOSPITAL_COMMUNITY): Payer: Self-pay | Admitting: Radiology

## 2017-04-14 DIAGNOSIS — R0600 Dyspnea, unspecified: Secondary | ICD-10-CM

## 2017-04-14 LAB — HEMOGLOBIN AND HEMATOCRIT, BLOOD
HCT: 15.3 % — ABNORMAL LOW (ref 36.0–46.0)
HCT: 25 % — ABNORMAL LOW (ref 36.0–46.0)
HCT: 21.8 % — ABNORMAL LOW (ref 36.0–46.0)
HCT: 21.5 % — ABNORMAL LOW (ref 36.0–46.0)
Hemoglobin: 5.1 g/dL — CL (ref 12.0–15.0)
Hemoglobin: 8.4 g/dL — ABNORMAL LOW (ref 12.0–15.0)
Hemoglobin: 7.3 g/dL — ABNORMAL LOW (ref 12.0–15.0)
Hemoglobin: 7.1 g/dL — ABNORMAL LOW (ref 12.0–15.0)

## 2017-04-14 LAB — GLUCOSE, CAPILLARY
Glucose-Capillary: 109 mg/dL — ABNORMAL HIGH (ref 65–99)
Glucose-Capillary: 120 mg/dL — ABNORMAL HIGH (ref 65–99)
Glucose-Capillary: 127 mg/dL — ABNORMAL HIGH (ref 65–99)
Glucose-Capillary: 53 mg/dL — ABNORMAL LOW (ref 65–99)
Glucose-Capillary: 88 mg/dL (ref 65–99)
Glucose-Capillary: 97 mg/dL (ref 65–99)

## 2017-04-14 LAB — BPAM RBC
Blood Product Expiration Date: 201902092359
Blood Product Expiration Date: 201902222359
Blood Product Expiration Date: 201902222359
Blood Product Expiration Date: 201902222359
Blood Product Expiration Date: 201902242359
Blood Product Expiration Date: 201902252359
Blood Product Expiration Date: 201902262359
Blood Product Expiration Date: 201902262359
Blood Product Expiration Date: 201902262359
Blood Product Expiration Date: 201902272359
Blood Product Expiration Date: 201903042359
ISSUE DATE / TIME: 201902021354
ISSUE DATE / TIME: 201902021701
ISSUE DATE / TIME: 201902022015
ISSUE DATE / TIME: 201902022326
ISSUE DATE / TIME: 201902030210
ISSUE DATE / TIME: 201902030210
ISSUE DATE / TIME: 201902031843
ISSUE DATE / TIME: 201902040451
ISSUE DATE / TIME: 201902040451
ISSUE DATE / TIME: 201902040451
ISSUE DATE / TIME: 201902042047
Unit Type and Rh: 6200
Unit Type and Rh: 6200
Unit Type and Rh: 6200
Unit Type and Rh: 8400
Unit Type and Rh: 8400
Unit Type and Rh: 8400
Unit Type and Rh: 8400
Unit Type and Rh: 8400
Unit Type and Rh: 8400
Unit Type and Rh: 8400
Unit Type and Rh: 8400

## 2017-04-14 LAB — BPAM FFP
Blood Product Expiration Date: 201902092359
Blood Product Expiration Date: 201902092359
ISSUE DATE / TIME: 201902041524
ISSUE DATE / TIME: 201902041553
Unit Type and Rh: 8400
Unit Type and Rh: 8400

## 2017-04-14 LAB — BLOOD GAS, ARTERIAL
Acid-Base Excess: 9.7 mmol/L — ABNORMAL HIGH (ref 0.0–2.0)
Bicarbonate: 33.9 mmol/L — ABNORMAL HIGH (ref 20.0–28.0)
Drawn by: 24487
FIO2: 40
MECHVT: 420 mL
O2 Saturation: 93.6 %
PEEP: 5 cmH2O
Patient temperature: 99
RATE: 18 resp/min
pCO2 arterial: 47.7 mmHg (ref 32.0–48.0)
pH, Arterial: 7.467 — ABNORMAL HIGH (ref 7.350–7.450)
pO2, Arterial: 68.2 mmHg — ABNORMAL LOW (ref 83.0–108.0)

## 2017-04-14 LAB — PREPARE FRESH FROZEN PLASMA
Unit division: 0
Unit division: 0

## 2017-04-14 LAB — TYPE AND SCREEN
ABO/RH(D): AB POS
Antibody Screen: NEGATIVE
Unit division: 0
Unit division: 0
Unit division: 0
Unit division: 0
Unit division: 0
Unit division: 0
Unit division: 0
Unit division: 0
Unit division: 0
Unit division: 0
Unit division: 0

## 2017-04-14 LAB — CBC
HCT: 26 % — ABNORMAL LOW (ref 36.0–46.0)
Hemoglobin: 8.9 g/dL — ABNORMAL LOW (ref 12.0–15.0)
MCH: 31.3 pg (ref 26.0–34.0)
MCHC: 34.2 g/dL (ref 30.0–36.0)
MCV: 91.5 fL (ref 78.0–100.0)
Platelets: 72 10*3/uL — ABNORMAL LOW (ref 150–400)
RBC: 2.84 MIL/uL — ABNORMAL LOW (ref 3.87–5.11)
RDW: 16.4 % — ABNORMAL HIGH (ref 11.5–15.5)
WBC: 22.6 10*3/uL — ABNORMAL HIGH (ref 4.0–10.5)

## 2017-04-14 LAB — BASIC METABOLIC PANEL
Anion gap: 12 (ref 5–15)
BUN: 16 mg/dL (ref 6–20)
CO2: 31 mmol/L (ref 22–32)
Calcium: 7.2 mg/dL — ABNORMAL LOW (ref 8.9–10.3)
Chloride: 103 mmol/L (ref 101–111)
Creatinine, Ser: 1.59 mg/dL — ABNORMAL HIGH (ref 0.44–1.00)
GFR calc Af Amer: 39 mL/min — ABNORMAL LOW (ref >60)
GFR calc non Af Amer: 33 mL/min — ABNORMAL LOW (ref >60)
Glucose, Bld: 131 mg/dL — ABNORMAL HIGH (ref 65–99)
Potassium: 3.8 mmol/L (ref 3.5–5.1)
Sodium: 146 mmol/L — ABNORMAL HIGH (ref 135–145)

## 2017-04-14 LAB — BPAM PLATELET PHERESIS
Blood Product Expiration Date: 201902042359
Blood Product Expiration Date: 201902052359
ISSUE DATE / TIME: 201902042004
Unit Type and Rh: 6200
Unit Type and Rh: 7300

## 2017-04-14 LAB — PREPARE PLATELET PHERESIS
Unit division: 0
Unit division: 0

## 2017-04-14 LAB — ECHOCARDIOGRAM COMPLETE
Height: 66 in
Weight: 3089.97 oz

## 2017-04-14 LAB — PHOSPHORUS: Phosphorus: 5.6 mg/dL — ABNORMAL HIGH (ref 2.5–4.6)

## 2017-04-14 LAB — PROTIME-INR
INR: 1.27
Prothrombin Time: 15.8 seconds — ABNORMAL HIGH (ref 11.4–15.2)

## 2017-04-14 LAB — MAGNESIUM: Magnesium: 2 mg/dL (ref 1.7–2.4)

## 2017-04-14 MED ORDER — SODIUM CHLORIDE 0.9% FLUSH
10.0000 mL | Freq: Two times a day (BID) | INTRAVENOUS | Status: DC
  Administered 2017-04-14: 10 mL
  Administered 2017-04-15: 20 mL

## 2017-04-14 MED ORDER — POTASSIUM CHLORIDE 10 MEQ/50ML IV SOLN
10.0000 meq | INTRAVENOUS | Status: AC
  Administered 2017-04-14 (×2): 10 meq via INTRAVENOUS
  Filled 2017-04-14 (×2): qty 50

## 2017-04-14 MED ORDER — CHLORHEXIDINE GLUCONATE CLOTH 2 % EX PADS
6.0000 | MEDICATED_PAD | Freq: Every day | CUTANEOUS | Status: DC
  Administered 2017-04-15 – 2017-04-22 (×7): 6 via TOPICAL

## 2017-04-14 MED ORDER — SODIUM CHLORIDE 0.9 % IV SOLN
INTRAVENOUS | Status: DC
  Administered 2017-04-15: 09:00:00 via INTRAVENOUS

## 2017-04-14 MED ORDER — SODIUM CHLORIDE 0.9 % IV SOLN
2.0000 g | Freq: Once | INTRAVENOUS | Status: AC
  Administered 2017-04-14: 2 g via INTRAVENOUS
  Filled 2017-04-14: qty 20

## 2017-04-14 MED ORDER — CHLORHEXIDINE GLUCONATE CLOTH 2 % EX PADS: 6.0000 | MEDICATED_PAD | Freq: Every day | CUTANEOUS | Status: DC

## 2017-04-14 MED ORDER — SODIUM CHLORIDE 0.9% FLUSH
10.0000 mL | INTRAVENOUS | Status: DC | PRN
  Administered 2017-04-17: 30 mL
  Administered 2017-04-17 – 2017-04-18 (×2): 10 mL
  Administered 2017-04-21: 20 mL
  Filled 2017-04-14 (×4): qty 40

## 2017-04-14 MED ORDER — DEXTROSE 50 % IV SOLN
INTRAVENOUS | Status: AC
  Administered 2017-04-14: 50 mL
  Filled 2017-04-14: qty 50

## 2017-04-14 NOTE — Progress Notes (Signed)
Subjective: Overnight events noted.  Objective: Vital signs in last 24 hours: Temp:  [98.6 F (37 C)-100.3 F (37.9 C)] 98.6 F (37 C) (02/05 0327) Pulse Rate:  [25-124] 45 (02/05 0615) Resp:  [9-27] 18 (02/05 0615) BP: (82-187)/(45-98) 136/52 (02/05 0600) SpO2:  [72 %-100 %] 95 % (02/05 0615) Arterial Line BP: (66-169)/(36-88) 74/58 (02/05 0615) FiO2 (%):  [40 %] 40 % (02/05 0400) Weight:  [87.6 kg (193 lb 2 oz)] 87.6 kg (193 lb 2 oz) (02/05 0346) Last BM Date: 04/13/17  Intake/Output from previous day: 02/04 0701 - 02/05 0700 In: 5081.6 [I.V.:3253.1; Blood:1558.5; IV Piggyback:270] Out: 525 [Urine:525] Intake/Output this shift: No intake/output data recorded.  General appearance: alert GI: soft, non-tender; bowel sounds normal; no masses,  no organomegaly  Lab Results: Recent Labs    04/12/17 0412  04/13/17 0346  04/13/17 1600 04/13/17 2325 04/14/17 0338  WBC 35.2*  --  23.5*  --   --   --  22.6*  HGB 9.6*   < > 4.5*   < > 8.9* 9.3* 8.9*  HCT 28.3*   < > 13.0*   < > 25.7* 27.2* 26.0*  PLT 72*  --  66*  --   --   --  72*   < > = values in this interval not displayed.   BMET Recent Labs    04/12/17 2000 04/13/17 0346 04/14/17 0044  NA 148* 145 146*  K 3.0* 3.9 3.8  CL 113* 105 103  CO2 _0 GLUCOSE 107* 223* 131*  BUN _1 CREATININE 1.09* 1.46* 1.59*  CALCIUM 6.0* 6.4* 7.2*   LFT No results for input(s): PROT, ALBUMIN, AST, ALT, ALKPHOS, BILITOT, BILIDIR, IBILI in the last 72 hours. PT/INR Recent Labs    04/13/17 0926  LABPROT 22.3*  INR 1.97   Hepatitis Panel No results for input(s): HEPBSAG, HCVAB, HEPAIGM, HEPBIGM in the last 72 hours. C-Diff No results for input(s): CDIFFTOX in the last 72 hours. Fecal Lactopherrin No results for input(s): FECLLACTOFRN in the last 72 hours.  Studies/Results: Ct Abdomen Pelvis Wo Contrast  Result Date: 04/13/2017 CLINICAL DATA:  Evaluate for retroperitoneal hematoma status post arterial access.  EXAM: CT ABDOMEN AND PELVIS WITHOUT CONTRAST TECHNIQUE: Multidetector CT imaging of the abdomen and pelvis was performed following the standard protocol without IV contrast. COMPARISON:  04/03/2017 FINDINGS: Lower chest: Moderate bilateral pleural effusions with overlying compressive type atelectasis. Hepatobiliary: No focal liver abnormality. Vicarious excretion of contrast material into the gallbladder is noted. Pancreas: Unremarkable. No pancreatic ductal dilatation or surrounding inflammatory changes. Spleen: Normal in size without focal abnormality. Adrenals/Urinary Tract: Normal adrenal glands. There is a striated nephrographic appearance of both kidneys, right greater than left. IV contrast material is identified within a dilated left renal collecting system and ureter. Asymmetrically diminished contrast within the right renal collecting system identified. Urinary bladder collapsed around a Foley catheter. Stomach/Bowel: There is a gastrostomy tube with tip in the distal stomach. Moderate to increased attenuation material is identified within the gastric lumen, image 17 of series 3. There also high attenuation material is identified within the small and large bowel loops. Vascular/Lymphatic: Aortic atherosclerosis. No aneurysm. Filter identified within the IVC. There is no adenopathy. Reproductive: Status post hysterectomy. No adnexal masses. Other: There is a small volume of ascites identified. Small to moderate volume of ascites within the right upper quadrant of the abdomen and right lower quadrant of the abdomen and pelvis. There is diffuse body wall edema compatible  with anasarca. No retroperitoneal hematoma identified. Musculoskeletal: No acute or significant osseous findings. IMPRESSION: 1. No evidence for retroperitoneal hematoma. 2. Striated nephrographic appearance of both kidneys identified, right greater than left. Imaging findings are favored to represent sequelae of acute tubular necrosis. Not  excluded but favored less likely would be an embolic phenomenon resulting in areas of renal infarct. 3. Left-sided hydronephrosis and hydroureter. Etiology indeterminate. Cannot rule out distal ureteral obstruction. 4. Bilateral pleural effusions, ascites and diffuse body wall edema compatible with fluid overload. 5. Intermediate to high attenuation material within the stomach. Although conceivably this could be related to it administration of enteric contrast material ongoing bleeding within the gastric lumen is not excluded. 6.  Aortic Atherosclerosis (ICD10-I70.0). Electronically Signed   By: Kerby Moors M.D.   On: 04/13/2017 12:00   Ir Angiogram Visceral Selective  Result Date: 04/13/2017 INDICATION: 62 year old female with gastric ulcer and a visible bleeding vessel. Patient underwent embolization yesterday but continues to demonstrate evidence of upper GI bleeding. Repeat angiography is requested. EXAM: IR EMBO ART VEN HEMORR LYMPH EXTRAV INC GUIDE ROADMAPPING; ARTERIOGRAPHY; IR ULTRASOUND GUIDANCE VASC ACCESS RIGHT; SELECTIVE VISCERAL ARTERIOGRAPHY; ADDITIONAL ARTERIOGRAPHY; IR RIGHT FLOURO GUIDE CV LINE 1. Ultrasound-guided access right common femoral artery 2. Catheterization and angiogram of the left gastric artery 3. Catheterization and angiogram the celiac axis 4. Catheterization and angiogram of the splenic artery 5. Catheterization and angiogram of the superior division of the splenic artery 6. Catheterization and angiogram of the inferior division of the splenic artery 7. Coil embolization of the inferior division of the splenic artery MEDICATIONS: None ANESTHESIA/SEDATION: Moderate (conscious) sedation was employed during this procedure. A total of Versed 1 mg was administered intravenously. The patient was on a fentanyl drip. Moderate Sedation Time: 60 minutes. The patient's level of consciousness and vital signs were monitored continuously by radiology nursing throughout the procedure under  my direct supervision. CONTRAST:  80 mL Isovue 300 FLUOROSCOPY TIME:  Fluoroscopy Time: 13 minutes 6 seconds (1299 mGy). COMPLICATIONS: None immediate. PROCEDURE: Informed consent was obtained from the patient following explanation of the procedure, risks, benefits and alternatives. The patient understands, agrees and consents for the procedure. All questions were addressed. A time out was performed prior to the initiation of the procedure. Maximal barrier sterile technique utilized including caps, mask, sterile gowns, sterile gloves, large sterile drape, hand hygiene, and Betadine prep. The right common femoral artery was interrogated with ultrasound and found to be widely patent. An image was obtained and stored for the medical record. Local anesthesia was attained by infiltration with 1% lidocaine. A small dermatotomy was made. Under real-time sonographic guidance, the vessel was punctured with a 21 gauge micropuncture needle. Using standard technique, the initial micro needle was exchanged over a 0.018 micro wire for a transitional 4 [place] micro sheath. The micro sheath was then exchanged over a 0.035 wire for a 5 French vascular sheath. A C2 cobra catheter was advanced over a Bentson wire into the abdominal aorta. The catheter was used to select the left gastric artery which arises directly from the aorta. Catheter angiography was performed. Successful coil embolization of the left gastric artery on the prior angiogram. There is no significant perfusion in the region of the vascular clip. The C2 cobra catheter was then advanced into the celiac artery and a celiac arteriogram was performed. The vessels are significantly attenuated consistent with diffuse vaso spasm. Branches of the short gastric arteries are identified in the region of the previously placed endoscopic clip. Using  a glidewire, the 5 [place] catheter was successfully advanced into the splenic artery. Angiography was then performed in multiple  obliquities. The splenic artery divides into a superior and inferior division. Both divisions give rise to some a short gastric arteries. A Renegade high flow microcatheter was successfully advanced into the distal splenic artery and then into the superior division. Superior division short gastric arteries appear to provide blood flow more superiorly along the gastric fundus. The microcatheter was then successfully navigated into the inferior division of the splenic artery. The short gastric arteries are in close proximity to the endovascular clip. No definite active extravasation was identified. The decision was made to perform coil embolization. This will likely devascularize approximately 30-40% of the lower pole of the spleen. Coil embolization was performed using a combination of 2 and 3 mm interlock and vortex coils. Post embolization arteriography demonstrates complete cessation of flow in the arteries in the region of the previously placed endovascular clip. The catheters were removed. Hemostasis was attained with the assistance of an ExoSeal extra arterial vascular plug. IMPRESSION: 1. Successful prior coil embolization of the left gastric artery. 2. Coil embolization of the inferior division of the splenic artery and is associated short gastric arteries in the region of the previously placed endovascular clip. Angiography and embolization therapy has now been maximized. If the patient continues to hemorrhage, repeat endoscopy or surgery would likely be required. Signed, Criselda Peaches, MD [facility] Radiology Electronically Signed   By: Jacqulynn Cadet M.D.   On: 04/13/2017 19:09   Ir Angiogram Selective Each Additional Vessel  Result Date: 04/13/2017 INDICATION: 62 year old female with gastric ulcer and a visible bleeding vessel. Patient underwent embolization yesterday but continues to demonstrate evidence of upper GI bleeding. Repeat  angiography is requested. EXAM: IR EMBO ART VEN HEMORR LYMPH EXTRAV INC GUIDE ROADMAPPING; ARTERIOGRAPHY; IR ULTRASOUND GUIDANCE VASC ACCESS RIGHT; SELECTIVE VISCERAL ARTERIOGRAPHY; ADDITIONAL ARTERIOGRAPHY; IR RIGHT FLOURO GUIDE CV LINE 1. Ultrasound-guided access right common femoral artery 2. Catheterization and angiogram of the left gastric artery 3. Catheterization and angiogram the celiac axis 4. Catheterization and angiogram of the splenic artery 5. Catheterization and angiogram of the superior division of the splenic artery 6. Catheterization and angiogram of the inferior division of the splenic artery 7. Coil embolization of the inferior division of the splenic artery MEDICATIONS: None ANESTHESIA/SEDATION: Moderate (conscious) sedation was employed during this procedure. A total of Versed 1 mg was administered intravenously. The patient was on a fentanyl drip. Moderate Sedation Time: 60 minutes. The patient's level of consciousness and vital signs were monitored continuously by radiology nursing throughout the procedure under my direct supervision. CONTRAST:  80 mL Isovue 300 FLUOROSCOPY TIME:  Fluoroscopy Time: 13 minutes 6 seconds (1299 mGy). COMPLICATIONS: None immediate. PROCEDURE: Informed consent was obtained from the patient following explanation of the procedure, risks, benefits and alternatives. The patient understands, agrees and consents for the procedure. All questions were addressed. A time out was performed prior to the initiation of the procedure. Maximal barrier sterile technique utilized including caps, mask, sterile gowns, sterile gloves, large sterile drape, hand hygiene, and Betadine prep. The right common femoral artery was interrogated with ultrasound and found to be widely patent. An image was obtained and stored for the medical record. Local anesthesia was attained by infiltration with 1% lidocaine. A small dermatotomy was made. Under real-time sonographic guidance, the vessel was  punctured with a 21 gauge micropuncture needle. Using standard technique, the initial micro needle was  exchanged over a 0.018 micro wire for a transitional 4 [place] micro sheath. The micro sheath was then exchanged over a 0.035 wire for a 5 French vascular sheath. A C2 cobra catheter was advanced over a Bentson wire into the abdominal aorta. The catheter was used to select the left gastric artery which arises directly from the aorta. Catheter angiography was performed. Successful coil embolization of the left gastric artery on the prior angiogram. There is no significant perfusion in the region of the vascular clip. The C2 cobra catheter was then advanced into the celiac artery and a celiac arteriogram was performed. The vessels are significantly attenuated consistent with diffuse vaso spasm. Branches of the short gastric arteries are identified in the region of the previously placed endoscopic clip. Using a glidewire, the 5 [place] catheter was successfully advanced into the splenic artery. Angiography was then performed in multiple obliquities. The splenic artery divides into a superior and inferior division. Both divisions give rise to some a short gastric arteries. A Renegade high flow microcatheter was successfully advanced into the distal splenic artery and then into the superior division. Superior division short gastric arteries appear to provide blood flow more superiorly along the gastric fundus. The microcatheter was then successfully navigated into the inferior division of the splenic artery. The short gastric arteries are in close proximity to the endovascular clip. No definite active extravasation was identified. The decision was made to perform coil embolization. This will likely devascularize approximately 30-40% of the lower pole of the spleen. Coil embolization was performed using a combination of 2 and 3 mm interlock and vortex coils. Post embolization arteriography demonstrates complete cessation  of flow in the arteries in the region of the previously placed endovascular clip. The catheters were removed. Hemostasis was attained with the assistance of an ExoSeal extra arterial vascular plug. IMPRESSION: 1. Successful prior coil embolization of the left gastric artery. 2. Coil embolization of the inferior division of the splenic artery and is associated short gastric arteries in the region of the previously placed endovascular clip. Angiography and embolization therapy has now been maximized. If the patient continues to hemorrhage, repeat endoscopy or surgery would likely be required. Signed, Criselda Peaches, MD [facility] Radiology Electronically Signed   By: Jacqulynn Cadet M.D.   On: 04/13/2017 19:09   Ir Angiogram Selective Each Additional Vessel  Result Date: 04/13/2017 INDICATION: 62 year old female with gastric ulcer and a visible bleeding vessel. Patient underwent embolization yesterday but continues to demonstrate evidence of upper GI bleeding. Repeat angiography is requested. EXAM: IR EMBO ART VEN HEMORR LYMPH EXTRAV INC GUIDE ROADMAPPING; ARTERIOGRAPHY; IR ULTRASOUND GUIDANCE VASC ACCESS RIGHT; SELECTIVE VISCERAL ARTERIOGRAPHY; ADDITIONAL ARTERIOGRAPHY; IR RIGHT FLOURO GUIDE CV LINE 1. Ultrasound-guided access right common femoral artery 2. Catheterization and angiogram of the left gastric artery 3. Catheterization and angiogram the celiac axis 4. Catheterization and angiogram of the splenic artery 5. Catheterization and angiogram of the superior division of the splenic artery 6. Catheterization and angiogram of the inferior division of the splenic artery 7. Coil embolization of the inferior division of the splenic artery MEDICATIONS: None ANESTHESIA/SEDATION: Moderate (conscious) sedation was employed during this procedure. A total of Versed 1 mg was administered intravenously. The patient was on a fentanyl drip. Moderate Sedation Time: 60  minutes. The patient's level of consciousness and vital signs were monitored continuously by radiology nursing throughout the procedure under my direct supervision. CONTRAST:  80 mL Isovue 300 FLUOROSCOPY TIME:  Fluoroscopy Time: 13  minutes 6 seconds (1299 mGy). COMPLICATIONS: None immediate. PROCEDURE: Informed consent was obtained from the patient following explanation of the procedure, risks, benefits and alternatives. The patient understands, agrees and consents for the procedure. All questions were addressed. A time out was performed prior to the initiation of the procedure. Maximal barrier sterile technique utilized including caps, mask, sterile gowns, sterile gloves, large sterile drape, hand hygiene, and Betadine prep. The right common femoral artery was interrogated with ultrasound and found to be widely patent. An image was obtained and stored for the medical record. Local anesthesia was attained by infiltration with 1% lidocaine. A small dermatotomy was made. Under real-time sonographic guidance, the vessel was punctured with a 21 gauge micropuncture needle. Using standard technique, the initial micro needle was exchanged over a 0.018 micro wire for a transitional 4 [place] micro sheath. The micro sheath was then exchanged over a 0.035 wire for a 5 French vascular sheath. A C2 cobra catheter was advanced over a Bentson wire into the abdominal aorta. The catheter was used to select the left gastric artery which arises directly from the aorta. Catheter angiography was performed. Successful coil embolization of the left gastric artery on the prior angiogram. There is no significant perfusion in the region of the vascular clip. The C2 cobra catheter was then advanced into the celiac artery and a celiac arteriogram was performed. The vessels are significantly attenuated consistent with diffuse vaso spasm. Branches of the short gastric arteries are identified in the region of the previously placed endoscopic  clip. Using a glidewire, the 5 [place] catheter was successfully advanced into the splenic artery. Angiography was then performed in multiple obliquities. The splenic artery divides into a superior and inferior division. Both divisions give rise to some a short gastric arteries. A Renegade high flow microcatheter was successfully advanced into the distal splenic artery and then into the superior division. Superior division short gastric arteries appear to provide blood flow more superiorly along the gastric fundus. The microcatheter was then successfully navigated into the inferior division of the splenic artery. The short gastric arteries are in close proximity to the endovascular clip. No definite active extravasation was identified. The decision was made to perform coil embolization. This will likely devascularize approximately 30-40% of the lower pole of the spleen. Coil embolization was performed using a combination of 2 and 3 mm interlock and vortex coils. Post embolization arteriography demonstrates complete cessation of flow in the arteries in the region of the previously placed endovascular clip. The catheters were removed. Hemostasis was attained with the assistance of an ExoSeal extra arterial vascular plug. IMPRESSION: 1. Successful prior coil embolization of the left gastric artery. 2. Coil embolization of the inferior division of the splenic artery and is associated short gastric arteries in the region of the previously placed endovascular clip. Angiography and embolization therapy has now been maximized. If the patient continues to hemorrhage, repeat endoscopy or surgery would likely be required. Signed, Criselda Peaches, MD [facility] Radiology Electronically Signed   By: Jacqulynn Cadet M.D.   On: 04/13/2017 19:09   Ir Angiogram Follow Up Study  Result Date: 04/13/2017 INDICATION: 62 year old female with gastric ulcer and a visible bleeding  vessel. Patient underwent embolization yesterday but continues to demonstrate evidence of upper GI bleeding. Repeat angiography is requested. EXAM: IR EMBO ART VEN HEMORR LYMPH EXTRAV INC GUIDE ROADMAPPING; ARTERIOGRAPHY; IR ULTRASOUND GUIDANCE VASC ACCESS RIGHT; SELECTIVE VISCERAL ARTERIOGRAPHY; ADDITIONAL ARTERIOGRAPHY; IR RIGHT FLOURO GUIDE CV LINE 1.  Ultrasound-guided access right common femoral artery 2. Catheterization and angiogram of the left gastric artery 3. Catheterization and angiogram the celiac axis 4. Catheterization and angiogram of the splenic artery 5. Catheterization and angiogram of the superior division of the splenic artery 6. Catheterization and angiogram of the inferior division of the splenic artery 7. Coil embolization of the inferior division of the splenic artery MEDICATIONS: None ANESTHESIA/SEDATION: Moderate (conscious) sedation was employed during this procedure. A total of Versed 1 mg was administered intravenously. The patient was on a fentanyl drip. Moderate Sedation Time: 60 minutes. The patient's level of consciousness and vital signs were monitored continuously by radiology nursing throughout the procedure under my direct supervision. CONTRAST:  80 mL Isovue 300 FLUOROSCOPY TIME:  Fluoroscopy Time: 13 minutes 6 seconds (1299 mGy). COMPLICATIONS: None immediate. PROCEDURE: Informed consent was obtained from the patient following explanation of the procedure, risks, benefits and alternatives. The patient understands, agrees and consents for the procedure. All questions were addressed. A time out was performed prior to the initiation of the procedure. Maximal barrier sterile technique utilized including caps, mask, sterile gowns, sterile gloves, large sterile drape, hand hygiene, and Betadine prep. The right common femoral artery was interrogated with ultrasound and found to be widely patent. An image was obtained and stored for the medical record. Local anesthesia was attained by  infiltration with 1% lidocaine. A small dermatotomy was made. Under real-time sonographic guidance, the vessel was punctured with a 21 gauge micropuncture needle. Using standard technique, the initial micro needle was exchanged over a 0.018 micro wire for a transitional 4 [place] micro sheath. The micro sheath was then exchanged over a 0.035 wire for a 5 French vascular sheath. A C2 cobra catheter was advanced over a Bentson wire into the abdominal aorta. The catheter was used to select the left gastric artery which arises directly from the aorta. Catheter angiography was performed. Successful coil embolization of the left gastric artery on the prior angiogram. There is no significant perfusion in the region of the vascular clip. The C2 cobra catheter was then advanced into the celiac artery and a celiac arteriogram was performed. The vessels are significantly attenuated consistent with diffuse vaso spasm. Branches of the short gastric arteries are identified in the region of the previously placed endoscopic clip. Using a glidewire, the 5 [place] catheter was successfully advanced into the splenic artery. Angiography was then performed in multiple obliquities. The splenic artery divides into a superior and inferior division. Both divisions give rise to some a short gastric arteries. A Renegade high flow microcatheter was successfully advanced into the distal splenic artery and then into the superior division. Superior division short gastric arteries appear to provide blood flow more superiorly along the gastric fundus. The microcatheter was then successfully navigated into the inferior division of the splenic artery. The short gastric arteries are in close proximity to the endovascular clip. No definite active extravasation was identified. The decision was made to perform coil embolization. This will likely devascularize approximately 30-40% of the lower pole of the spleen. Coil embolization was performed using a  combination of 2 and 3 mm interlock and vortex coils. Post embolization arteriography demonstrates complete cessation of flow in the arteries in the region of the previously placed endovascular clip. The catheters were removed. Hemostasis was attained with the assistance of an ExoSeal extra arterial vascular plug. IMPRESSION: 1. Successful prior coil embolization of the left gastric artery. 2. Coil embolization of the inferior division of the splenic artery and is  associated short gastric arteries in the region of the previously placed endovascular clip. Angiography and embolization therapy has now been maximized. If the patient continues to hemorrhage, repeat endoscopy or surgery would likely be required. Signed, Criselda Peaches, MD [facility] Radiology Electronically Signed   By: Jacqulynn Cadet M.D.   On: 04/13/2017 19:09   Ir Fluoro Guide Cv Line Right  Result Date: 04/13/2017 INDICATION: 62 year old female with gastric ulcer and a visible bleeding vessel. Patient underwent embolization yesterday but continues to demonstrate evidence of upper GI bleeding. Repeat angiography is requested. EXAM: IR EMBO ART VEN HEMORR LYMPH EXTRAV INC GUIDE ROADMAPPING; ARTERIOGRAPHY; IR ULTRASOUND GUIDANCE VASC ACCESS RIGHT; SELECTIVE VISCERAL ARTERIOGRAPHY; ADDITIONAL ARTERIOGRAPHY; IR RIGHT FLOURO GUIDE CV LINE 1. Ultrasound-guided access right common femoral artery 2. Catheterization and angiogram of the left gastric artery 3. Catheterization and angiogram the celiac axis 4. Catheterization and angiogram of the splenic artery 5. Catheterization and angiogram of the superior division of the splenic artery 6. Catheterization and angiogram of the inferior division of the splenic artery 7. Coil embolization of the inferior division of the splenic artery MEDICATIONS: None ANESTHESIA/SEDATION: Moderate (conscious) sedation was employed during this procedure. A total of  Versed 1 mg was administered intravenously. The patient was on a fentanyl drip. Moderate Sedation Time: 60 minutes. The patient's level of consciousness and vital signs were monitored continuously by radiology nursing throughout the procedure under my direct supervision. CONTRAST:  80 mL Isovue 300 FLUOROSCOPY TIME:  Fluoroscopy Time: 13 minutes 6 seconds (1299 mGy). COMPLICATIONS: None immediate. PROCEDURE: Informed consent was obtained from the patient following explanation of the procedure, risks, benefits and alternatives. The patient understands, agrees and consents for the procedure. All questions were addressed. A time out was performed prior to the initiation of the procedure. Maximal barrier sterile technique utilized including caps, mask, sterile gowns, sterile gloves, large sterile drape, hand hygiene, and Betadine prep. The right common femoral artery was interrogated with ultrasound and found to be widely patent. An image was obtained and stored for the medical record. Local anesthesia was attained by infiltration with 1% lidocaine. A small dermatotomy was made. Under real-time sonographic guidance, the vessel was punctured with a 21 gauge micropuncture needle. Using standard technique, the initial micro needle was exchanged over a 0.018 micro wire for a transitional 4 [place] micro sheath. The micro sheath was then exchanged over a 0.035 wire for a 5 French vascular sheath. A C2 cobra catheter was advanced over a Bentson wire into the abdominal aorta. The catheter was used to select the left gastric artery which arises directly from the aorta. Catheter angiography was performed. Successful coil embolization of the left gastric artery on the prior angiogram. There is no significant perfusion in the region of the vascular clip. The C2 cobra catheter was then advanced into the celiac artery and a celiac arteriogram was performed. The vessels are significantly attenuated consistent with diffuse vaso spasm.  Branches of the short gastric arteries are identified in the region of the previously placed endoscopic clip. Using a glidewire, the 5 [place] catheter was successfully advanced into the splenic artery. Angiography was then performed in multiple obliquities. The splenic artery divides into a superior and inferior division. Both divisions give rise to some a short gastric arteries. A Renegade high flow microcatheter was successfully advanced into the distal splenic artery and then into the superior division. Superior division short gastric arteries appear to provide blood flow more superiorly along the gastric  fundus. The microcatheter was then successfully navigated into the inferior division of the splenic artery. The short gastric arteries are in close proximity to the endovascular clip. No definite active extravasation was identified. The decision was made to perform coil embolization. This will likely devascularize approximately 30-40% of the lower pole of the spleen. Coil embolization was performed using a combination of 2 and 3 mm interlock and vortex coils. Post embolization arteriography demonstrates complete cessation of flow in the arteries in the region of the previously placed endovascular clip. The catheters were removed. Hemostasis was attained with the assistance of an ExoSeal extra arterial vascular plug. IMPRESSION: 1. Successful prior coil embolization of the left gastric artery. 2. Coil embolization of the inferior division of the splenic artery and is associated short gastric arteries in the region of the previously placed endovascular clip. Angiography and embolization therapy has now been maximized. If the patient continues to hemorrhage, repeat endoscopy or surgery would likely be required. Signed, Criselda Peaches, MD [facility] Inter[facility] Radiology Specialists [facility] Radiology Electronically Signed   By: Jacqulynn Cadet M.D.   On: 04/13/2017 19:09   Ir US Guide Vasc Access  Right  Result Date: 04/13/2017 INDICATION: 62 year old female with gastric ulcer and a visible bleeding vessel. Patient underwent embolization yesterday but continues to demonstrate evidence of upper GI bleeding. Repeat angiography is requested. EXAM: IR EMBO ART VEN HEMORR LYMPH EXTRAV INC GUIDE ROADMAPPING; ARTERIOGRAPHY; IR ULTRASOUND GUIDANCE VASC ACCESS RIGHT; SELECTIVE VISCERAL ARTERIOGRAPHY; ADDITIONAL ARTERIOGRAPHY; IR RIGHT FLOURO GUIDE CV LINE 1. Ultrasound-guided access right common femoral artery 2. Catheterization and angiogram of the left gastric artery 3. Catheterization and angiogram the celiac axis 4. Catheterization and angiogram of the splenic artery 5. Catheterization and angiogram of the superior division of the splenic artery 6. Catheterization and angiogram of the inferior division of the splenic artery 7. Coil embolization of the inferior division of the splenic artery MEDICATIONS: None ANESTHESIA/SEDATION: Moderate (conscious) sedation was employed during this procedure. A total of Versed 1 mg was administered intravenously. The patient was on a fentanyl drip. Moderate Sedation Time: 60 minutes. The patient's level of consciousness and vital signs were monitored continuously by radiology nursing throughout the procedure under my direct supervision. CONTRAST:  80 mL Isovue 300 FLUOROSCOPY TIME:  Fluoroscopy Time: 13 minutes 6 seconds (1299 mGy). COMPLICATIONS: None immediate. PROCEDURE: Informed consent was obtained from the patient following explanation of the procedure, risks, benefits and alternatives. The patient understands, agrees and consents for the procedure. All questions were addressed. A time out was performed prior to the initiation of the procedure. Maximal barrier sterile technique utilized including caps, mask, sterile gowns, sterile gloves, large sterile drape, hand hygiene, and Betadine prep. The right common femoral artery was interrogated with ultrasound and found to be  widely patent. An image was obtained and stored for the medical record. Local anesthesia was attained by infiltration with 1% lidocaine. A small dermatotomy was made. Under real-time sonographic guidance, the vessel was punctured with a 21 gauge micropuncture needle. Using standard technique, the initial micro needle was exchanged over a 0.018 micro wire for a transitional 4 [place] micro sheath. The micro sheath was then exchanged over a 0.035 wire for a 5 French vascular sheath. A C2 cobra catheter was advanced over a Bentson wire into the abdominal aorta. The catheter was used to select the left gastric artery which arises directly from the aorta. Catheter angiography was performed. Successful coil embolization of the left gastric artery on the prior angiogram.  There is no significant perfusion in the region of the vascular clip. The C2 cobra catheter was then advanced into the celiac artery and a celiac arteriogram was performed. The vessels are significantly attenuated consistent with diffuse vaso spasm. Branches of the short gastric arteries are identified in the region of the previously placed endoscopic clip. Using a glidewire, the 5 [place] catheter was successfully advanced into the splenic artery. Angiography was then performed in multiple obliquities. The splenic artery divides into a superior and inferior division. Both divisions give rise to some a short gastric arteries. A Renegade high flow microcatheter was successfully advanced into the distal splenic artery and then into the superior division. Superior division short gastric arteries appear to provide blood flow more superiorly along the gastric fundus. The microcatheter was then successfully navigated into the inferior division of the splenic artery. The short gastric arteries are in close proximity to the endovascular clip. No definite active extravasation was identified. The decision was made to perform coil embolization. This will likely  devascularize approximately 30-40% of the lower pole of the spleen. Coil embolization was performed using a combination of 2 and 3 mm interlock and vortex coils. Post embolization arteriography demonstrates complete cessation of flow in the arteries in the region of the previously placed endovascular clip. The catheters were removed. Hemostasis was attained with the assistance of an ExoSeal extra arterial vascular plug. IMPRESSION: 1. Successful prior coil embolization of the left gastric artery. 2. Coil embolization of the inferior division of the splenic artery and is associated short gastric arteries in the region of the previously placed endovascular clip. Angiography and embolization therapy has now been maximized. If the patient continues to hemorrhage, repeat endoscopy or surgery would likely be required. Signed, Criselda Peaches, MD [facility] Inter[facility] Radiology Specialists [facility] Radiology Electronically Signed   By: Jacqulynn Cadet M.D.   On: 04/13/2017 19:09   Ir US Guide Vasc Access Right  Result Date: 04/13/2017 INDICATION: 62 year old female with gastric ulcer and a visible bleeding vessel. Patient underwent embolization yesterday but continues to demonstrate evidence of upper GI bleeding. Repeat angiography is requested. EXAM: IR EMBO ART VEN HEMORR LYMPH EXTRAV INC GUIDE ROADMAPPING; ARTERIOGRAPHY; IR ULTRASOUND GUIDANCE VASC ACCESS RIGHT; SELECTIVE VISCERAL ARTERIOGRAPHY; ADDITIONAL ARTERIOGRAPHY; IR RIGHT FLOURO GUIDE CV LINE 1. Ultrasound-guided access right common femoral artery 2. Catheterization and angiogram of the left gastric artery 3. Catheterization and angiogram the celiac axis 4. Catheterization and angiogram of the splenic artery 5. Catheterization and angiogram of the superior division of the splenic artery 6. Catheterization and angiogram of the inferior division of the splenic artery 7. Coil embolization of the inferior division of the splenic artery MEDICATIONS:  None ANESTHESIA/SEDATION: Moderate (conscious) sedation was employed during this procedure. A total of Versed 1 mg was administered intravenously. The patient was on a fentanyl drip. Moderate Sedation Time: 60 minutes. The patient's level of consciousness and vital signs were monitored continuously by radiology nursing throughout the procedure under my direct supervision. CONTRAST:  80 mL Isovue 300 FLUOROSCOPY TIME:  Fluoroscopy Time: 13 minutes 6 seconds (1299 mGy). COMPLICATIONS: None immediate. PROCEDURE: Informed consent was obtained from the patient following explanation of the procedure, risks, benefits and alternatives. The patient understands, agrees and consents for the procedure. All questions were addressed. A time out was performed prior to the initiation of the procedure. Maximal barrier sterile technique utilized including caps, mask, sterile gowns, sterile gloves, large sterile drape, hand hygiene, and Betadine prep. The right common femoral artery  was interrogated with ultrasound and found to be widely patent. An image was obtained and stored for the medical record. Local anesthesia was attained by infiltration with 1% lidocaine. A small dermatotomy was made. Under real-time sonographic guidance, the vessel was punctured with a 21 gauge micropuncture needle. Using standard technique, the initial micro needle was exchanged over a 0.018 micro wire for a transitional 4 [place] micro sheath. The micro sheath was then exchanged over a 0.035 wire for a 5 French vascular sheath. A C2 cobra catheter was advanced over a Bentson wire into the abdominal aorta. The catheter was used to select the left gastric artery which arises directly from the aorta. Catheter angiography was performed. Successful coil embolization of the left gastric artery on the prior angiogram. There is no significant perfusion in the region of the vascular clip. The C2 cobra catheter was then advanced into the celiac artery and a celiac  arteriogram was performed. The vessels are significantly attenuated consistent with diffuse vaso spasm. Branches of the short gastric arteries are identified in the region of the previously placed endoscopic clip. Using a glidewire, the 5 [place] catheter was successfully advanced into the splenic artery. Angiography was then performed in multiple obliquities. The splenic artery divides into a superior and inferior division. Both divisions give rise to some a short gastric arteries. A Renegade high flow microcatheter was successfully advanced into the distal splenic artery and then into the superior division. Superior division short gastric arteries appear to provide blood flow more superiorly along the gastric fundus. The microcatheter was then successfully navigated into the inferior division of the splenic artery. The short gastric arteries are in close proximity to the endovascular clip. No definite active extravasation was identified. The decision was made to perform coil embolization. This will likely devascularize approximately 30-40% of the lower pole of the spleen. Coil embolization was performed using a combination of 2 and 3 mm interlock and vortex coils. Post embolization arteriography demonstrates complete cessation of flow in the arteries in the region of the previously placed endovascular clip. The catheters were removed. Hemostasis was attained with the assistance of an ExoSeal extra arterial vascular plug. IMPRESSION: 1. Successful prior coil embolization of the left gastric artery. 2. Coil embolization of the inferior division of the splenic artery and is associated short gastric arteries in the region of the previously placed endovascular clip. Angiography and embolization therapy has now been maximized. If the patient continues to hemorrhage, repeat endoscopy or surgery would likely be required. Signed, Criselda Peaches, MD Vascular and Interventional Radiology Specialists [facility] Radiology  Electronically Signed   By: Jacqulynn Cadet M.D.   On: 04/13/2017 19:09   Dg Chest Port 1 View  Result Date: 04/14/2017 CLINICAL DATA:  Respiratory failure EXAM: PORTABLE CHEST 1 VIEW COMPARISON:  04/13/2017 FINDINGS: Cardiac shadow is within normal limits. Endotracheal tube and nasogastric catheter are again noted in satisfactory position. Previously seen left jugular line is been removed and a new right jugular line placed in satisfactory position. Bilateral pleural effusions right greater than left are noted with associated atelectatic changes. IMPRESSION: Increasing bilateral pleural effusions and atelectasis. Electronically Signed   By: Inez Catalina M.D.   On: 04/14/2017 06:59   Dg Chest Port 1 View  Result Date: 04/13/2017 CLINICAL DATA:  Respiratory failure EXAM: PORTABLE CHEST 1 VIEW COMPARISON:  Portable chest x-ray of April 12, 2017 FINDINGS: The lungs are reasonably well inflated. There is persistent increased density at both lung bases greatest on the left.  The heart is normal in size. The pulmonary vascularity is less engorged today. The endotracheal tube tip projects 4.5 cm above the carina. The left internal jugular venous catheter tip projects at the junction of the right and left brachiocephalic veins. IMPRESSION: Decreased pulmonary edema. Persistent bibasilar subsegmental atelectasis or pneumonia greatest on the left. Electronically Signed   By: David  Martinique M.D.   On: 04/13/2017 07:00   Dg Abd Portable 1v  Result Date: 04/13/2017 CLINICAL DATA:  62 year old female with nasogastric tube placement. Subsequent encounter. EXAM: PORTABLE ABDOMEN - 1 VIEW COMPARISON:  04/13/2017 CT FINDINGS: Nasogastric tube tip pylorus level with side hole body/antrum junction. Residual contrast within dilated left renal collecting system. Abnormal bowel gas pattern.  Please see CT report. IMPRESSION: Nasogastric tube tip gastric pylorus level with side hole gastric body/antrum junction. Electronically  Signed   By: Genia Del M.D.   On: 04/13/2017 15:36   [place] Guide Roadmapping  Result Date: 04/13/2017 INDICATION: 62 year old female with gastric ulcer and a visible bleeding vessel. Patient underwent embolization yesterday but continues to demonstrate evidence of upper GI bleeding. Repeat angiography is requested. EXAM: IR EMBO ART VEN HEMORR LYMPH EXTRAV INC GUIDE ROADMAPPING; ARTERIOGRAPHY; IR ULTRASOUND GUIDANCE VASC ACCESS RIGHT; SELECTIVE VISCERAL ARTERIOGRAPHY; ADDITIONAL ARTERIOGRAPHY; IR RIGHT FLOURO GUIDE CV LINE 1. Ultrasound-guided access right common femoral artery 2. Catheterization and angiogram of the left gastric artery 3. Catheterization and angiogram the celiac axis 4. Catheterization and angiogram of the splenic artery 5. Catheterization and angiogram of the superior division of the splenic artery 6. Catheterization and angiogram of the inferior division of the splenic artery 7. Coil embolization of the inferior division of the splenic artery MEDICATIONS: None ANESTHESIA/SEDATION: Moderate (conscious) sedation was employed during this procedure. A total of Versed 1 mg was administered intravenously. The patient was on a fentanyl drip. Moderate Sedation Time: 60 minutes. The patient's level of consciousness and vital signs were monitored continuously by radiology nursing throughout the procedure under my direct supervision. CONTRAST:  80 mL Isovue 300 FLUOROSCOPY TIME:  Fluoroscopy Time: 13 minutes 6 seconds (1299 mGy). COMPLICATIONS: None immediate. PROCEDURE: Informed consent was obtained from the patient following explanation of the procedure, risks, benefits and alternatives. The patient understands, agrees and consents for the procedure. All questions were addressed. A time out was performed prior to the initiation of the procedure. Maximal barrier sterile technique utilized including caps, mask, sterile gowns, sterile gloves, large sterile drape,  hand hygiene, and Betadine prep. The right common femoral artery was interrogated with ultrasound and found to be widely patent. An image was obtained and stored for the medical record. Local anesthesia was attained by infiltration with 1% lidocaine. A small dermatotomy was made. Under real-time sonographic guidance, the vessel was punctured with a 21 gauge micropuncture needle. Using standard technique, the initial micro needle was exchanged over a 0.018 micro wire for a transitional 4 [place] micro sheath. The micro sheath was then exchanged over a 0.035 wire for a 5 French vascular sheath. A C2 cobra catheter was advanced over a Bentson wire into the abdominal aorta. The catheter was used to select the left gastric artery which arises directly from the aorta. Catheter angiography was performed. Successful coil embolization of the left gastric artery on the prior angiogram. There is no significant perfusion in the region of the vascular clip. The C2 cobra catheter was then advanced into the celiac artery and a celiac arteriogram was performed. The vessels  are significantly attenuated consistent with diffuse vaso spasm. Branches of the short gastric arteries are identified in the region of the previously placed endoscopic clip. Using a glidewire, the 5 [place] catheter was successfully advanced into the splenic artery. Angiography was then performed in multiple obliquities. The splenic artery divides into a superior and inferior division. Both divisions give rise to some a short gastric arteries. A Renegade high flow microcatheter was successfully advanced into the distal splenic artery and then into the superior division. Superior division short gastric arteries appear to provide blood flow more superiorly along the gastric fundus. The microcatheter was then successfully navigated into the inferior division of the splenic artery. The short gastric arteries are in close proximity to the endovascular clip. No definite  active extravasation was identified. The decision was made to perform coil embolization. This will likely devascularize approximately 30-40% of the lower pole of the spleen. Coil embolization was performed using a combination of 2 and 3 mm interlock and vortex coils. Post embolization arteriography demonstrates complete cessation of flow in the arteries in the region of the previously placed endovascular clip. The catheters were removed. Hemostasis was attained with the assistance of an ExoSeal extra arterial vascular plug. IMPRESSION: 1. Successful prior coil embolization of the left gastric artery. 2. Coil embolization of the inferior division of the splenic artery and is associated short gastric arteries in the region of the previously placed endovascular clip. Angiography and embolization therapy has now been maximized. If the patient continues to hemorrhage, repeat endoscopy or surgery would likely be required. Signed, Criselda Peaches, MD Vascular and Interventional Radiology Specialists [facility] Radiology Electronically Signed   By: Jacqulynn Cadet M.D.   On: 04/13/2017 19:09    Medications:  Scheduled: . chlorhexidine gluconate (MEDLINE KIT)  15 mL Mouth Rinse BID  . Gerhardt's butt cream   Topical TID  . insulin aspart  2-6 Units Subcutaneous Q4H  . mouth rinse  15 mL Mouth Rinse QID  . pantoprazole  40 mg Intravenous Q12H  . sodium chloride flush  3 mL Intravenous Q12H   Continuous: . sodium chloride    . sodium chloride    . sodium chloride 100 mL/hr at 04/13/17 1800  . fentaNYL infusion INTRAVENOUS 125 mcg/hr (04/13/17 2323)  . norepinephrine (LEVOPHED) Adult infusion 9 mcg/min (04/14/17 0620)  . potassium chloride Stopped (04/12/17 2246)  . vasopressin (PITRESSIN) infusion - *FOR SHOCK* 0.03 Units/min (04/13/17 2100)    Assessment/Plan: 1) Upper GI bleed - severe. 2) Anemia. 3) Hypotension - improving.   Her HGB is stable and her transfusion requirements have stabilized  after repeat IR embolization.  At this point it appears that the intervention was very successful.  I will continue to monitor.  If her HGB starts to decline again I will repeat an EGD.  Plan: 1) Continue to monitor HGB. 2) Transfuse as necessary.  LOS: 12 days   Avarae Zwart D 04/14/2017, 7:35 AM

## 2017-04-14 NOTE — Progress Notes (Signed)
  Echocardiogram 2D Echocardiogram has been performed.  Kayla Wyatt T Steward Sames 04/14/2017, 12:24 PM

## 2017-04-14 NOTE — Progress Notes (Signed)
[facility] Progress Note Patient Name: Kayla Wyatt DOB: [birth year] MRN: [id number]   Date of Service  04/14/2017  HPI/Events of Note  Agitation - Request to renew restraint orders.   eICU Interventions  Will renew restraint orders.      Intervention Category Minor Interventions: Agitation / anxiety - evaluation and management  [name] 04/14/2017, 4:09 AM

## 2017-04-14 NOTE — Progress Notes (Signed)
eLink Physician-Brief Progress Note Patient Name: Kayla Wyatt DOB: October 25, 1955 MRN: [id number]   Date of Service  04/14/2017  HPI/Events of Note  Nurse reports that patient is having frequent PVC's and PAC's.  eICU Interventions  Will order:  1. Send AM Labs now.      Intervention Category Major Interventions: Arrhythmia - evaluation and management  Basir Niven Eugene 04/14/2017, 12:35 AM

## 2017-04-14 NOTE — Progress Notes (Signed)
LR a-line no longer able to be pulled back and waveform inconsistent on monitor. Removed LR a-line and held pressure x5 minutes. No complications noted.   RT attempted x 2 attempts to place RR aline in sterile manor without success. Obtained flash but was pretty clear instead of arterial blood due to pt being edematous. CCM NP made aware.

## 2017-04-14 NOTE — Progress Notes (Signed)
PULMONARY / CRITICAL CARE MEDICINE   Name: Viviene Thurston MRN: [id number] DOB: 30-Mar-1955    ADMISSION DATE:  04/02/2017 CONSULTATION DATE:  04/12/2017  REFERRING MD:  Dr. Shanon Brow   CHIEF COMPLAINT:  GI Bleed   HISTORY OF PRESENT ILLNESS:   62 year old female with PMH of HTN, HLD  Presented to ED On 1/24 with reported two days of ABD pain and bloody diarrhea. States she has had 15+ bowel movements in the morning of arrival. CT A/P with suspicion for ileus. GI consulted. EGD 1/25 with single visible vessel in the setting of a mild ulceration was found in the stomach in which was clipped. Patient continued to have anemia, hypotension, and melena. Was taken for colonoscopy on 1/31 in which revealed diverticulosis. 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 2/2 patient had hemoptysis and Xarelto was stopped, given 4 units RBCs. & 2 FFP . 2/3 in the early morning patient was found minimally responsive and hypotensive, requiring intubation and vent support. Transferred to ICU and GI called.   She was given Xarelto 4 factor Helen reversal agent at 12:45 on 2/3. 1 pack of platelets . 2 additional PRBC She was started on pressor support.   SUBJECTIVE:   One small BM overnight Clear bilious output from OGT Transfused 1 PRBC and platelets overnight Improving pressor requirements H/H stable  VITAL SIGNS: BP (!) 136/52   Pulse (!) 45   Temp 98.6 F (37 C) (Oral)   Resp 18   Ht 5\' 6"  (1.676 m)   Wt 193 lb 2 oz (87.6 kg)   SpO2 95%   BMI 31.17 kg/m   HEMODYNAMICS:    VENTILATOR SETTINGS: Vent Mode: PRVC FiO2 (%):  [40 %] 40 % Set Rate:  [18 bmp] 18 bmp Vt Set:  [420 mL] 420 mL PEEP:  [5 cmH20] 5 cmH20 Plateau Pressure:  [13 cmH20-17 cmH20] 16 cmH20  INTAKE / OUTPUT: I/O last 3 completed shifts: In: 8324.7 [I.V.:5511.3; Blood:2543.5;  IV Piggyback:270] Out: 1170 [Urine:1170]  PHYSICAL EXAMINATION: General:  Critically ill elderly female intubated on MV in NAD HEENT: MM pink/moist, ETT, OGT Neuro: RASS -1, Follows commands/ MAE CV: ir rrr, no m/r/g, right groin site soft, no ecchymosis  PULM: even/non-labored on MV,  lungs bilaterally coarse, diminished bibasilarly  GI: mildly distended, soft, hypoBS,  Extremities: warm/dry, generalized edema +1 Skin: no rashes   LABS:  BMET Recent Labs  Lab 04/12/17 2000 04/13/17 0346 04/14/17 0044  NA 148* 145 146*  K 3.0* 3.9 3.8  CL 113* 105 103  CO2 28 28 31   BUN 9 12 16   CREATININE 1.09* 1.46* 1.59*  GLUCOSE 107* 223* 131*    Electrolytes Recent Labs  Lab 04/12/17 0342  04/12/17 2000 04/13/17 0346 04/14/17 0044  CALCIUM 6.3*   < > 6.0* 6.4* 7.2*  MG 1.5*  --   --  2.0 2.0  PHOS 7.3*  --   --  5.0* 5.6*   < > = values in this interval not displayed.    CBC Recent Labs  Lab 04/12/17 0412  04/13/17 0346  04/13/17 1600 04/13/17 2325 04/14/17 0338  WBC 35.2*  --  23.5*  --   --   --  22.6*  HGB 9.6*   < > 4.5*   < > 8.9* 9.3* 8.9*  HCT  28.3*   < > 13.0*   < > 25.7* 27.2* 26.0*  PLT 72*  --  66*  --   --   --  72*   < > = values in this interval not displayed.    Coag's Recent Labs  Lab 04/13/17 0926  INR 1.97    Sepsis Markers Recent Labs  Lab 04/12/17 1107 04/13/17 0346 04/13/17 1030  LATICACIDVEN 2.8* 5.9* 4.2*    ABG Recent Labs  Lab 04/13/17 0330 04/13/17 0914 04/14/17 0404  PHART 7.545* 7.460* 7.467*  PCO2ART 39.1 43.4 47.7  PO2ART 106 91.0 68.2*    Liver Enzymes Recent Labs  Lab 04/07/17 1006 04/08/17 0248  AST 24 20  ALT 40 33  ALKPHOS 51 42  BILITOT 0.3 0.8  ALBUMIN 1.9* 1.9*    Cardiac Enzymes No results for input(s): TROPONINI, PROBNP in the last 168 hours.  Glucose Recent Labs  Lab 04/13/17 0840 04/13/17 1053 04/13/17 1453 04/13/17 1929 04/14/17 0009 04/14/17 0325  GLUCAP 158* 127* 142* 123*  120* 127*    Imaging Ct Abdomen Pelvis Wo Contrast  Result Date: 04/13/2017 CLINICAL DATA:  Evaluate for retroperitoneal hematoma status post arterial access. EXAM: CT ABDOMEN AND PELVIS WITHOUT CONTRAST TECHNIQUE: Multidetector CT imaging of the abdomen and pelvis was performed following the standard protocol without IV contrast. COMPARISON:  04/03/2017 FINDINGS: Lower chest: Moderate bilateral pleural effusions with overlying compressive type atelectasis. Hepatobiliary: No focal liver abnormality. Vicarious excretion of contrast material into the gallbladder is noted. Pancreas: Unremarkable. No pancreatic ductal dilatation or surrounding inflammatory changes. Spleen: Normal in size without focal abnormality. Adrenals/Urinary Tract: Normal adrenal glands. There is a striated nephrographic appearance of both kidneys, right greater than left. IV contrast material is identified within a dilated left renal collecting system and ureter. Asymmetrically diminished contrast within the right renal collecting system identified. Urinary bladder collapsed around a Foley catheter. Stomach/Bowel: There is a gastrostomy tube with tip in the distal stomach. Moderate to increased attenuation material is identified within the gastric lumen, image 17 of series 3. There also high attenuation material is identified within the small and large bowel loops. Vascular/Lymphatic: Aortic atherosclerosis. No aneurysm. Filter identified within the IVC. There is no adenopathy. Reproductive: Status post hysterectomy. No adnexal masses. Other: There is a small volume of ascites identified. Small to moderate volume of ascites within the right upper quadrant of the abdomen and right lower quadrant of the abdomen and pelvis. There is diffuse body wall edema compatible with anasarca. No retroperitoneal hematoma identified. Musculoskeletal: No acute or significant osseous findings. IMPRESSION: 1. No evidence for retroperitoneal hematoma. 2.  Striated nephrographic appearance of both kidneys identified, right greater than left. Imaging findings are favored to represent sequelae of acute tubular necrosis. Not excluded but favored less likely would be an embolic phenomenon resulting in areas of renal infarct. 3. Left-sided hydronephrosis and hydroureter. Etiology indeterminate. Cannot rule out distal ureteral obstruction. 4. Bilateral pleural effusions, ascites and diffuse body wall edema compatible with fluid overload. 5. Intermediate to high attenuation material within the stomach. Although conceivably this could be related to it administration of enteric contrast material ongoing bleeding within the gastric lumen is not excluded. 6.  Aortic Atherosclerosis (ICD10-I70.0). Electronically Signed   By: Kerby Moors M.D.   On: 04/13/2017 12:00   Ir Angiogram Visceral Selective  Result Date: 04/13/2017 INDICATION: 62 year old female with gastric ulcer and a visible bleeding vessel. Patient underwent embolization yesterday but continues to demonstrate evidence of upper GI bleeding. Repeat angiography  is requested. EXAM: IR EMBO ART VEN HEMORR LYMPH EXTRAV INC GUIDE ROADMAPPING; ARTERIOGRAPHY; IR ULTRASOUND GUIDANCE VASC ACCESS RIGHT; SELECTIVE VISCERAL ARTERIOGRAPHY; ADDITIONAL ARTERIOGRAPHY; IR RIGHT FLOURO GUIDE CV LINE 1. Ultrasound-guided access right common femoral artery 2. Catheterization and angiogram of the left gastric artery 3. Catheterization and angiogram the celiac axis 4. Catheterization and angiogram of the splenic artery 5. Catheterization and angiogram of the superior division of the splenic artery 6. Catheterization and angiogram of the inferior division of the splenic artery 7. Coil embolization of the inferior division of the splenic artery MEDICATIONS: None ANESTHESIA/SEDATION: Moderate (conscious) sedation was employed during this procedure. A total of Versed 1 mg was administered intravenously. The patient was on a fentanyl drip.  Moderate Sedation Time: 60 minutes. The patient's level of consciousness and vital signs were monitored continuously by radiology nursing throughout the procedure under my direct supervision. CONTRAST:  80 mL Isovue 300 FLUOROSCOPY TIME:  Fluoroscopy Time: 13 minutes 6 seconds (1299 mGy). COMPLICATIONS: None immediate. PROCEDURE: Informed consent was obtained from the patient following explanation of the procedure, risks, benefits and alternatives. The patient understands, agrees and consents for the procedure. All questions were addressed. A time out was performed prior to the initiation of the procedure. Maximal barrier sterile technique utilized including caps, mask, sterile gowns, sterile gloves, large sterile drape, hand hygiene, and Betadine prep. The right common femoral artery was interrogated with ultrasound and found to be widely patent. An image was obtained and stored for the medical record. Local anesthesia was attained by infiltration with 1% lidocaine. A small dermatotomy was made. Under real-time sonographic guidance, the vessel was punctured with a 21 gauge micropuncture needle. Using standard technique, the initial micro needle was exchanged over a 0.018 micro wire for a transitional 4 [place] micro sheath. The micro sheath was then exchanged over a 0.035 wire for a 5 French vascular sheath. A C2 cobra catheter was advanced over a Bentson wire into the abdominal aorta. The catheter was used to select the left gastric artery which arises directly from the aorta. Catheter angiography was performed. Successful coil embolization of the left gastric artery on the prior angiogram. There is no significant perfusion in the region of the vascular clip. The C2 cobra catheter was then advanced into the celiac artery and a celiac arteriogram was performed. The vessels are significantly attenuated consistent with diffuse vaso spasm. Branches of the short gastric arteries are identified in the region of the  previously placed endoscopic clip. Using a glidewire, the 5 [place] catheter was successfully advanced into the splenic artery. Angiography was then performed in multiple obliquities. The splenic artery divides into a superior and inferior division. Both divisions give rise to some a short gastric arteries. A Renegade high flow microcatheter was successfully advanced into the distal splenic artery and then into the superior division. Superior division short gastric arteries appear to provide blood flow more superiorly along the gastric fundus. The microcatheter was then successfully navigated into the inferior division of the splenic artery. The short gastric arteries are in close proximity to the endovascular clip. No definite active extravasation was identified. The decision was made to perform coil embolization. This will likely devascularize approximately 30-40% of the lower pole of the spleen. Coil embolization was performed using a combination of 2 and 3 mm interlock and vortex coils. Post embolization arteriography demonstrates complete cessation of flow in the arteries in the region of the previously placed endovascular clip. The catheters were removed. Hemostasis was attained with the  assistance of an ExoSeal extra arterial vascular plug. IMPRESSION: 1. Successful prior coil embolization of the left gastric artery. 2. Coil embolization of the inferior division of the splenic artery and is associated short gastric arteries in the region of the previously placed endovascular clip. Angiography and embolization therapy has now been maximized. If the patient continues to hemorrhage, repeat endoscopy or surgery would likely be required. Signed, Criselda Peaches, MD [facility] Radiology Electronically Signed   By: Jacqulynn Cadet M.D.   On: 04/13/2017 19:09   Ir Angiogram Selective Each Additional Vessel  Result Date: 04/13/2017 INDICATION: 62 year old  female with gastric ulcer and a visible bleeding vessel. Patient underwent embolization yesterday but continues to demonstrate evidence of upper GI bleeding. Repeat angiography is requested. EXAM: IR EMBO ART VEN HEMORR LYMPH EXTRAV INC GUIDE ROADMAPPING; ARTERIOGRAPHY; IR ULTRASOUND GUIDANCE VASC ACCESS RIGHT; SELECTIVE VISCERAL ARTERIOGRAPHY; ADDITIONAL ARTERIOGRAPHY; IR RIGHT FLOURO GUIDE CV LINE 1. Ultrasound-guided access right common femoral artery 2. Catheterization and angiogram of the left gastric artery 3. Catheterization and angiogram the celiac axis 4. Catheterization and angiogram of the splenic artery 5. Catheterization and angiogram of the superior division of the splenic artery 6. Catheterization and angiogram of the inferior division of the splenic artery 7. Coil embolization of the inferior division of the splenic artery MEDICATIONS: None ANESTHESIA/SEDATION: Moderate (conscious) sedation was employed during this procedure. A total of Versed 1 mg was administered intravenously. The patient was on a fentanyl drip. Moderate Sedation Time: 60 minutes. The patient's level of consciousness and vital signs were monitored continuously by radiology nursing throughout the procedure under my direct supervision. CONTRAST:  80 mL Isovue 300 FLUOROSCOPY TIME:  Fluoroscopy Time: 13 minutes 6 seconds (1299 mGy). COMPLICATIONS: None immediate. PROCEDURE: Informed consent was obtained from the patient following explanation of the procedure, risks, benefits and alternatives. The patient understands, agrees and consents for the procedure. All questions were addressed. A time out was performed prior to the initiation of the procedure. Maximal barrier sterile technique utilized including caps, mask, sterile gowns, sterile gloves, large sterile drape, hand hygiene, and Betadine prep. The right common femoral artery was interrogated with ultrasound and found to be widely patent. An image was obtained and stored for the  medical record. Local anesthesia was attained by infiltration with 1% lidocaine. A small dermatotomy was made. Under real-time sonographic guidance, the vessel was punctured with a 21 gauge micropuncture needle. Using standard technique, the initial micro needle was exchanged over a 0.018 micro wire for a transitional 4 [place] micro sheath. The micro sheath was then exchanged over a 0.035 wire for a 5 French vascular sheath. A C2 cobra catheter was advanced over a Bentson wire into the abdominal aorta. The catheter was used to select the left gastric artery which arises directly from the aorta. Catheter angiography was performed. Successful coil embolization of the left gastric artery on the prior angiogram. There is no significant perfusion in the region of the vascular clip. The C2 cobra catheter was then advanced into the celiac artery and a celiac arteriogram was performed. The vessels are significantly attenuated consistent with diffuse vaso spasm. Branches of the short gastric arteries are identified in the region of the previously placed endoscopic clip. Using a glidewire, the 5 [place] catheter was successfully advanced into the splenic artery. Angiography was then performed in multiple obliquities. The splenic artery divides into a superior and inferior division. Both divisions give rise to some a short gastric arteries. A Renegade  high flow microcatheter was successfully advanced into the distal splenic artery and then into the superior division. Superior division short gastric arteries appear to provide blood flow more superiorly along the gastric fundus. The microcatheter was then successfully navigated into the inferior division of the splenic artery. The short gastric arteries are in close proximity to the endovascular clip. No definite active extravasation was identified. The decision was made to perform coil embolization. This will likely devascularize approximately 30-40% of the lower pole of the  spleen. Coil embolization was performed using a combination of 2 and 3 mm interlock and vortex coils. Post embolization arteriography demonstrates complete cessation of flow in the arteries in the region of the previously placed endovascular clip. The catheters were removed. Hemostasis was attained with the assistance of an ExoSeal extra arterial vascular plug. IMPRESSION: 1. Successful prior coil embolization of the left gastric artery. 2. Coil embolization of the inferior division of the splenic artery and is associated short gastric arteries in the region of the previously placed endovascular clip. Angiography and embolization therapy has now been maximized. If the patient continues to hemorrhage, repeat endoscopy or surgery would likely be required. Signed, Criselda Peaches, MD [facility] Radiology Electronically Signed   By: Jacqulynn Cadet M.D.   On: 04/13/2017 19:09   Ir Angiogram Selective Each Additional Vessel  Result Date: 04/13/2017 INDICATION: 62 year old female with gastric ulcer and a visible bleeding vessel. Patient underwent embolization yesterday but continues to demonstrate evidence of upper GI bleeding. Repeat angiography is requested. EXAM: IR EMBO ART VEN HEMORR LYMPH EXTRAV INC GUIDE ROADMAPPING; ARTERIOGRAPHY; IR ULTRASOUND GUIDANCE VASC ACCESS RIGHT; SELECTIVE VISCERAL ARTERIOGRAPHY; ADDITIONAL ARTERIOGRAPHY; IR RIGHT FLOURO GUIDE CV LINE 1. Ultrasound-guided access right common femoral artery 2. Catheterization and angiogram of the left gastric artery 3. Catheterization and angiogram the celiac axis 4. Catheterization and angiogram of the splenic artery 5. Catheterization and angiogram of the superior division of the splenic artery 6. Catheterization and angiogram of the inferior division of the splenic artery 7. Coil embolization of the inferior division of the splenic artery MEDICATIONS: None ANESTHESIA/SEDATION: Moderate  (conscious) sedation was employed during this procedure. A total of Versed 1 mg was administered intravenously. The patient was on a fentanyl drip. Moderate Sedation Time: 60 minutes. The patient's level of consciousness and vital signs were monitored continuously by radiology nursing throughout the procedure under my direct supervision. CONTRAST:  80 mL Isovue 300 FLUOROSCOPY TIME:  Fluoroscopy Time: 13 minutes 6 seconds (1299 mGy). COMPLICATIONS: None immediate. PROCEDURE: Informed consent was obtained from the patient following explanation of the procedure, risks, benefits and alternatives. The patient understands, agrees and consents for the procedure. All questions were addressed. A time out was performed prior to the initiation of the procedure. Maximal barrier sterile technique utilized including caps, mask, sterile gowns, sterile gloves, large sterile drape, hand hygiene, and Betadine prep. The right common femoral artery was interrogated with ultrasound and found to be widely patent. An image was obtained and stored for the medical record. Local anesthesia was attained by infiltration with 1% lidocaine. A small dermatotomy was made. Under real-time sonographic guidance, the vessel was punctured with a 21 gauge micropuncture needle. Using standard technique, the initial micro needle was exchanged over a 0.018 micro wire for a transitional 4 [place] micro sheath. The micro sheath was then exchanged over a 0.035 wire for a 5 French vascular sheath. A C2 cobra catheter was advanced over a Bentson wire into the abdominal aorta.  The catheter was used to select the left gastric artery which arises directly from the aorta. Catheter angiography was performed. Successful coil embolization of the left gastric artery on the prior angiogram. There is no significant perfusion in the region of the vascular clip. The C2 cobra catheter was then advanced into the celiac artery and a celiac arteriogram was performed. The  vessels are significantly attenuated consistent with diffuse vaso spasm. Branches of the short gastric arteries are identified in the region of the previously placed endoscopic clip. Using a glidewire, the 5 [place] catheter was successfully advanced into the splenic artery. Angiography was then performed in multiple obliquities. The splenic artery divides into a superior and inferior division. Both divisions give rise to some a short gastric arteries. A Renegade high flow microcatheter was successfully advanced into the distal splenic artery and then into the superior division. Superior division short gastric arteries appear to provide blood flow more superiorly along the gastric fundus. The microcatheter was then successfully navigated into the inferior division of the splenic artery. The short gastric arteries are in close proximity to the endovascular clip. No definite active extravasation was identified. The decision was made to perform coil embolization. This will likely devascularize approximately 30-40% of the lower pole of the spleen. Coil embolization was performed using a combination of 2 and 3 mm interlock and vortex coils. Post embolization arteriography demonstrates complete cessation of flow in the arteries in the region of the previously placed endovascular clip. The catheters were removed. Hemostasis was attained with the assistance of an ExoSeal extra arterial vascular plug. IMPRESSION: 1. Successful prior coil embolization of the left gastric artery. 2. Coil embolization of the inferior division of the splenic artery and is associated short gastric arteries in the region of the previously placed endovascular clip. Angiography and embolization therapy has now been maximized. If the patient continues to hemorrhage, repeat endoscopy or surgery would likely be required. Signed, Criselda Peaches, MD [facility] Radiology Electronically Signed   By:  Jacqulynn Cadet M.D.   On: 04/13/2017 19:09   Ir Angiogram Follow Up Study  Result Date: 04/13/2017 INDICATION: 62 year old female with gastric ulcer and a visible bleeding vessel. Patient underwent embolization yesterday but continues to demonstrate evidence of upper GI bleeding. Repeat angiography is requested. EXAM: IR EMBO ART VEN HEMORR LYMPH EXTRAV INC GUIDE ROADMAPPING; ARTERIOGRAPHY; IR ULTRASOUND GUIDANCE VASC ACCESS RIGHT; SELECTIVE VISCERAL ARTERIOGRAPHY; ADDITIONAL ARTERIOGRAPHY; IR RIGHT FLOURO GUIDE CV LINE 1. Ultrasound-guided access right common femoral artery 2. Catheterization and angiogram of the left gastric artery 3. Catheterization and angiogram the celiac axis 4. Catheterization and angiogram of the splenic artery 5. Catheterization and angiogram of the superior division of the splenic artery 6. Catheterization and angiogram of the inferior division of the splenic artery 7. Coil embolization of the inferior division of the splenic artery MEDICATIONS: None ANESTHESIA/SEDATION: Moderate (conscious) sedation was employed during this procedure. A total of Versed 1 mg was administered intravenously. The patient was on a fentanyl drip. Moderate Sedation Time: 60 minutes. The patient's level of consciousness and vital signs were monitored continuously by radiology nursing throughout the procedure under my direct supervision. CONTRAST:  80 mL Isovue 300 FLUOROSCOPY TIME:  Fluoroscopy Time: 13 minutes 6 seconds (1299 mGy). COMPLICATIONS: None immediate. PROCEDURE: Informed consent was obtained from the patient following explanation of the procedure, risks, benefits and alternatives. The patient understands, agrees and consents for the procedure. All questions were addressed. A time out was performed prior  to the initiation of the procedure. Maximal barrier sterile technique utilized including caps, mask, sterile gowns, sterile gloves, large sterile drape, hand hygiene, and Betadine prep. The right  common femoral artery was interrogated with ultrasound and found to be widely patent. An image was obtained and stored for the medical record. Local anesthesia was attained by infiltration with 1% lidocaine. A small dermatotomy was made. Under real-time sonographic guidance, the vessel was punctured with a 21 gauge micropuncture needle. Using standard technique, the initial micro needle was exchanged over a 0.018 micro wire for a transitional 4 [place] micro sheath. The micro sheath was then exchanged over a 0.035 wire for a 5 French vascular sheath. A C2 cobra catheter was advanced over a Bentson wire into the abdominal aorta. The catheter was used to select the left gastric artery which arises directly from the aorta. Catheter angiography was performed. Successful coil embolization of the left gastric artery on the prior angiogram. There is no significant perfusion in the region of the vascular clip. The C2 cobra catheter was then advanced into the celiac artery and a celiac arteriogram was performed. The vessels are significantly attenuated consistent with diffuse vaso spasm. Branches of the short gastric arteries are identified in the region of the previously placed endoscopic clip. Using a glidewire, the 5 [place] catheter was successfully advanced into the splenic artery. Angiography was then performed in multiple obliquities. The splenic artery divides into a superior and inferior division. Both divisions give rise to some a short gastric arteries. A Renegade high flow microcatheter was successfully advanced into the distal splenic artery and then into the superior division. Superior division short gastric arteries appear to provide blood flow more superiorly along the gastric fundus. The microcatheter was then successfully navigated into the inferior division of the splenic artery. The short gastric arteries are in close proximity to the endovascular clip. No definite active extravasation was identified. The  decision was made to perform coil embolization. This will likely devascularize approximately 30-40% of the lower pole of the spleen. Coil embolization was performed using a combination of 2 and 3 mm interlock and vortex coils. Post embolization arteriography demonstrates complete cessation of flow in the arteries in the region of the previously placed endovascular clip. The catheters were removed. Hemostasis was attained with the assistance of an ExoSeal extra arterial vascular plug. IMPRESSION: 1. Successful prior coil embolization of the left gastric artery. 2. Coil embolization of the inferior division of the splenic artery and is associated short gastric arteries in the region of the previously placed endovascular clip. Angiography and embolization therapy has now been maximized. If the patient continues to hemorrhage, repeat endoscopy or surgery would likely be required. Signed, Criselda Peaches, MD [facility] Radiology Electronically Signed   By: Jacqulynn Cadet M.D.   On: 04/13/2017 19:09   Ir Fluoro Guide Cv Line Right  Result Date: 04/13/2017 INDICATION: 62 year old female with gastric ulcer and a visible bleeding vessel. Patient underwent embolization yesterday but continues to demonstrate evidence of upper GI bleeding. Repeat angiography is requested. EXAM: IR EMBO ART VEN HEMORR LYMPH EXTRAV INC GUIDE ROADMAPPING; ARTERIOGRAPHY; IR ULTRASOUND GUIDANCE VASC ACCESS RIGHT; SELECTIVE VISCERAL ARTERIOGRAPHY; ADDITIONAL ARTERIOGRAPHY; IR RIGHT FLOURO GUIDE CV LINE 1. Ultrasound-guided access right common femoral artery 2. Catheterization and angiogram of the left gastric artery 3. Catheterization and angiogram the celiac axis 4. Catheterization and angiogram of the splenic artery 5. Catheterization and angiogram of the superior division of the splenic artery 6.  Catheterization and angiogram of the inferior division of the splenic artery 7. Coil  embolization of the inferior division of the splenic artery MEDICATIONS: None ANESTHESIA/SEDATION: Moderate (conscious) sedation was employed during this procedure. A total of Versed 1 mg was administered intravenously. The patient was on a fentanyl drip. Moderate Sedation Time: 60 minutes. The patient's level of consciousness and vital signs were monitored continuously by radiology nursing throughout the procedure under my direct supervision. CONTRAST:  80 mL Isovue 300 FLUOROSCOPY TIME:  Fluoroscopy Time: 13 minutes 6 seconds (1299 mGy). COMPLICATIONS: None immediate. PROCEDURE: Informed consent was obtained from the patient following explanation of the procedure, risks, benefits and alternatives. The patient understands, agrees and consents for the procedure. All questions were addressed. A time out was performed prior to the initiation of the procedure. Maximal barrier sterile technique utilized including caps, mask, sterile gowns, sterile gloves, large sterile drape, hand hygiene, and Betadine prep. The right common femoral artery was interrogated with ultrasound and found to be widely patent. An image was obtained and stored for the medical record. Local anesthesia was attained by infiltration with 1% lidocaine. A small dermatotomy was made. Under real-time sonographic guidance, the vessel was punctured with a 21 gauge micropuncture needle. Using standard technique, the initial micro needle was exchanged over a 0.018 micro wire for a transitional 4 [place] micro sheath. The micro sheath was then exchanged over a 0.035 wire for a 5 French vascular sheath. A C2 cobra catheter was advanced over a Bentson wire into the abdominal aorta. The catheter was used to select the left gastric artery which arises directly from the aorta. Catheter angiography was performed. Successful coil embolization of the left gastric artery on the prior angiogram. There is no significant perfusion in the region of the vascular clip. The  C2 cobra catheter was then advanced into the celiac artery and a celiac arteriogram was performed. The vessels are significantly attenuated consistent with diffuse vaso spasm. Branches of the short gastric arteries are identified in the region of the previously placed endoscopic clip. Using a glidewire, the 5 [place] catheter was successfully advanced into the splenic artery. Angiography was then performed in multiple obliquities. The splenic artery divides into a superior and inferior division. Both divisions give rise to some a short gastric arteries. A Renegade high flow microcatheter was successfully advanced into the distal splenic artery and then into the superior division. Superior division short gastric arteries appear to provide blood flow more superiorly along the gastric fundus. The microcatheter was then successfully navigated into the inferior division of the splenic artery. The short gastric arteries are in close proximity to the endovascular clip. No definite active extravasation was identified. The decision was made to perform coil embolization. This will likely devascularize approximately 30-40% of the lower pole of the spleen. Coil embolization was performed using a combination of 2 and 3 mm interlock and vortex coils. Post embolization arteriography demonstrates complete cessation of flow in the arteries in the region of the previously placed endovascular clip. The catheters were removed. Hemostasis was attained with the assistance of an ExoSeal extra arterial vascular plug. IMPRESSION: 1. Successful prior coil embolization of the left gastric artery. 2. Coil embolization of the inferior division of the splenic artery and is associated short gastric arteries in the region of the previously placed endovascular clip. Angiography and embolization therapy has now been maximized. If the patient continues to hemorrhage, repeat endoscopy or surgery would likely be required. Signed, Criselda Peaches, MD  [facility]  [facility] Radiology [facility] Radiology Electronically Signed   By: Jacqulynn Cadet M.D.   On: 04/13/2017 19:09   Ir US Guide Vasc Access Right  Result Date: 04/13/2017 INDICATION: 62 year old female with gastric ulcer and a visible bleeding vessel. Patient underwent embolization yesterday but continues to demonstrate evidence of upper GI bleeding. Repeat angiography is requested. EXAM: IR EMBO ART VEN HEMORR LYMPH EXTRAV INC GUIDE ROADMAPPING; ARTERIOGRAPHY; IR ULTRASOUND GUIDANCE VASC ACCESS RIGHT; SELECTIVE VISCERAL ARTERIOGRAPHY; ADDITIONAL ARTERIOGRAPHY; IR RIGHT FLOURO GUIDE CV LINE 1. Ultrasound-guided access right common femoral artery 2. Catheterization and angiogram of the left gastric artery 3. Catheterization and angiogram the celiac axis 4. Catheterization and angiogram of the splenic artery 5. Catheterization and angiogram of the superior division of the splenic artery 6. Catheterization and angiogram of the inferior division of the splenic artery 7. Coil embolization of the inferior division of the splenic artery MEDICATIONS: None ANESTHESIA/SEDATION: Moderate (conscious) sedation was employed during this procedure. A total of Versed 1 mg was administered intravenously. The patient was on a fentanyl drip. Moderate Sedation Time: 60 minutes. The patient's level of consciousness and vital signs were monitored continuously by radiology nursing throughout the procedure under my direct supervision. CONTRAST:  80 mL Isovue 300 FLUOROSCOPY TIME:  Fluoroscopy Time: 13 minutes 6 seconds (1299 mGy). COMPLICATIONS: None immediate. PROCEDURE: Informed consent was obtained from the patient following explanation of the procedure, risks, benefits and alternatives. The patient understands, agrees and consents for the procedure. All questions were addressed. A time out was performed prior to the initiation of the procedure. Maximal barrier sterile technique utilized including caps,  mask, sterile gowns, sterile gloves, large sterile drape, hand hygiene, and Betadine prep. The right common femoral artery was interrogated with ultrasound and found to be widely patent. An image was obtained and stored for the medical record. Local anesthesia was attained by infiltration with 1% lidocaine. A small dermatotomy was made. Under real-time sonographic guidance, the vessel was punctured with a 21 gauge micropuncture needle. Using standard technique, the initial micro needle was exchanged over a 0.018 micro wire for a transitional 4 [place] micro sheath. The micro sheath was then exchanged over a 0.035 wire for a 5 French vascular sheath. A C2 cobra catheter was advanced over a Bentson wire into the abdominal aorta. The catheter was used to select the left gastric artery which arises directly from the aorta. Catheter angiography was performed. Successful coil embolization of the left gastric artery on the prior angiogram. There is no significant perfusion in the region of the vascular clip. The C2 cobra catheter was then advanced into the celiac artery and a celiac arteriogram was performed. The vessels are significantly attenuated consistent with diffuse vaso spasm. Branches of the short gastric arteries are identified in the region of the previously placed endoscopic clip. Using a glidewire, the 5 [place] catheter was successfully advanced into the splenic artery. Angiography was then performed in multiple obliquities. The splenic artery divides into a superior and inferior division. Both divisions give rise to some a short gastric arteries. A Renegade high flow microcatheter was successfully advanced into the distal splenic artery and then into the superior division. Superior division short gastric arteries appear to provide blood flow more superiorly along the gastric fundus. The microcatheter was then successfully navigated into the inferior division of the splenic artery. The short gastric arteries  are in close proximity to the endovascular clip. No definite active extravasation was identified. The decision was made to perform coil embolization. This  will likely devascularize approximately 30-40% of the lower pole of the spleen. Coil embolization was performed using a combination of 2 and 3 mm interlock and vortex coils. Post embolization arteriography demonstrates complete cessation of flow in the arteries in the region of the previously placed endovascular clip. The catheters were removed. Hemostasis was attained with the assistance of an ExoSeal extra arterial vascular plug. IMPRESSION: 1. Successful prior coil embolization of the left gastric artery. 2. Coil embolization of the inferior division of the splenic artery and is associated short gastric arteries in the region of the previously placed endovascular clip. Angiography and embolization therapy has now been maximized. If the patient continues to hemorrhage, repeat endoscopy or surgery would likely be required. Signed, Criselda Peaches, MD [facility] Radiology Electronically Signed   By: Jacqulynn Cadet M.D.   On: 04/13/2017 19:09   Ir US Guide Vasc Access Right  Result Date: 04/13/2017 INDICATION: 62 year old female with gastric ulcer and a visible bleeding vessel. Patient underwent embolization yesterday but continues to demonstrate evidence of upper GI bleeding. Repeat angiography is requested. EXAM: IR EMBO ART VEN HEMORR LYMPH EXTRAV INC GUIDE ROADMAPPING; ARTERIOGRAPHY; IR ULTRASOUND GUIDANCE VASC ACCESS RIGHT; SELECTIVE VISCERAL ARTERIOGRAPHY; ADDITIONAL ARTERIOGRAPHY; IR RIGHT FLOURO GUIDE CV LINE 1. Ultrasound-guided access right common femoral artery 2. Catheterization and angiogram of the left gastric artery 3. Catheterization and angiogram the celiac axis 4. Catheterization and angiogram of the splenic artery 5. Catheterization and angiogram of the superior division of the splenic  artery 6. Catheterization and angiogram of the inferior division of the splenic artery 7. Coil embolization of the inferior division of the splenic artery MEDICATIONS: None ANESTHESIA/SEDATION: Moderate (conscious) sedation was employed during this procedure. A total of Versed 1 mg was administered intravenously. The patient was on a fentanyl drip. Moderate Sedation Time: 60 minutes. The patient's level of consciousness and vital signs were monitored continuously by radiology nursing throughout the procedure under my direct supervision. CONTRAST:  80 mL Isovue 300 FLUOROSCOPY TIME:  Fluoroscopy Time: 13 minutes 6 seconds (1299 mGy). COMPLICATIONS: None immediate. PROCEDURE: Informed consent was obtained from the patient following explanation of the procedure, risks, benefits and alternatives. The patient understands, agrees and consents for the procedure. All questions were addressed. A time out was performed prior to the initiation of the procedure. Maximal barrier sterile technique utilized including caps, mask, sterile gowns, sterile gloves, large sterile drape, hand hygiene, and Betadine prep. The right common femoral artery was interrogated with ultrasound and found to be widely patent. An image was obtained and stored for the medical record. Local anesthesia was attained by infiltration with 1% lidocaine. A small dermatotomy was made. Under real-time sonographic guidance, the vessel was punctured with a 21 gauge micropuncture needle. Using standard technique, the initial micro needle was exchanged over a 0.018 micro wire for a transitional 4 [place] micro sheath. The micro sheath was then exchanged over a 0.035 wire for a 5 French vascular sheath. A C2 cobra catheter was advanced over a Bentson wire into the abdominal aorta. The catheter was used to select the left gastric artery which arises directly from the aorta. Catheter angiography was performed. Successful coil embolization of the left gastric artery on  the prior angiogram. There is no significant perfusion in the region of the vascular clip. The C2 cobra catheter was then advanced into the celiac artery and a celiac arteriogram was performed. The vessels are significantly attenuated consistent with diffuse vaso spasm. Branches of the  short gastric arteries are identified in the region of the previously placed endoscopic clip. Using a glidewire, the 5 [place] catheter was successfully advanced into the splenic artery. Angiography was then performed in multiple obliquities. The splenic artery divides into a superior and inferior division. Both divisions give rise to some a short gastric arteries. A Renegade high flow microcatheter was successfully advanced into the distal splenic artery and then into the superior division. Superior division short gastric arteries appear to provide blood flow more superiorly along the gastric fundus. The microcatheter was then successfully navigated into the inferior division of the splenic artery. The short gastric arteries are in close proximity to the endovascular clip. No definite active extravasation was identified. The decision was made to perform coil embolization. This will likely devascularize approximately 30-40% of the lower pole of the spleen. Coil embolization was performed using a combination of 2 and 3 mm interlock and vortex coils. Post embolization arteriography demonstrates complete cessation of flow in the arteries in the region of the previously placed endovascular clip. The catheters were removed. Hemostasis was attained with the assistance of an ExoSeal extra arterial vascular plug. IMPRESSION: 1. Successful prior coil embolization of the left gastric artery. 2. Coil embolization of the inferior division of the splenic artery and is associated short gastric arteries in the region of the previously placed endovascular clip. Angiography and embolization therapy has now been maximized. If the patient continues to  hemorrhage, repeat endoscopy or surgery would likely be required. Signed, Criselda Peaches, MD [facility] Radiology Electronically Signed   By: Jacqulynn Cadet M.D.   On: 04/13/2017 19:09   Dg Chest Port 1 View  Result Date: 04/14/2017 CLINICAL DATA:  Respiratory failure EXAM: PORTABLE CHEST 1 VIEW COMPARISON:  04/13/2017 FINDINGS: Cardiac shadow is within normal limits. Endotracheal tube and nasogastric catheter are again noted in satisfactory position. Previously seen left jugular line is been removed and a new right jugular line placed in satisfactory position. Bilateral pleural effusions right greater than left are noted with associated atelectatic changes. IMPRESSION: Increasing bilateral pleural effusions and atelectasis. Electronically Signed   By: Inez Catalina M.D.   On: 04/14/2017 06:59   Dg Abd Portable 1v  Result Date: 04/13/2017 CLINICAL DATA:  62 year old female with nasogastric tube placement. Subsequent encounter. EXAM: PORTABLE ABDOMEN - 1 VIEW COMPARISON:  04/13/2017 CT FINDINGS: Nasogastric tube tip pylorus level with side hole body/antrum junction. Residual contrast within dilated left renal collecting system. Abnormal bowel gas pattern.  Please see CT report. IMPRESSION: Nasogastric tube tip gastric pylorus level with side hole gastric body/antrum junction. Electronically Signed   By: Genia Del M.D.   On: 04/13/2017 15:36   [place] Guide Roadmapping  Result Date: 04/13/2017 INDICATION: 62 year old female with gastric ulcer and a visible bleeding vessel. Patient underwent embolization yesterday but continues to demonstrate evidence of upper GI bleeding. Repeat angiography is requested. EXAM: IR EMBO ART VEN HEMORR LYMPH EXTRAV INC GUIDE ROADMAPPING; ARTERIOGRAPHY; IR ULTRASOUND GUIDANCE VASC ACCESS RIGHT; SELECTIVE VISCERAL ARTERIOGRAPHY; ADDITIONAL ARTERIOGRAPHY; IR RIGHT FLOURO GUIDE CV LINE 1.  Ultrasound-guided access right common femoral artery 2. Catheterization and angiogram of the left gastric artery 3. Catheterization and angiogram the celiac axis 4. Catheterization and angiogram of the splenic artery 5. Catheterization and angiogram of the superior division of the splenic artery 6. Catheterization and angiogram of the inferior division of the splenic artery 7. Coil embolization of the inferior division  of the splenic artery MEDICATIONS: None ANESTHESIA/SEDATION: Moderate (conscious) sedation was employed during this procedure. A total of Versed 1 mg was administered intravenously. The patient was on a fentanyl drip. Moderate Sedation Time: 60 minutes. The patient's level of consciousness and vital signs were monitored continuously by radiology nursing throughout the procedure under my direct supervision. CONTRAST:  80 mL Isovue 300 FLUOROSCOPY TIME:  Fluoroscopy Time: 13 minutes 6 seconds (1299 mGy). COMPLICATIONS: None immediate. PROCEDURE: Informed consent was obtained from the patient following explanation of the procedure, risks, benefits and alternatives. The patient understands, agrees and consents for the procedure. All questions were addressed. A time out was performed prior to the initiation of the procedure. Maximal barrier sterile technique utilized including caps, mask, sterile gowns, sterile gloves, large sterile drape, hand hygiene, and Betadine prep. The right common femoral artery was interrogated with ultrasound and found to be widely patent. An image was obtained and stored for the medical record. Local anesthesia was attained by infiltration with 1% lidocaine. A small dermatotomy was made. Under real-time sonographic guidance, the vessel was punctured with a 21 gauge micropuncture needle. Using standard technique, the initial micro needle was exchanged over a 0.018 micro wire for a transitional 4 [place] micro sheath. The micro sheath was then exchanged over a 0.035 wire for a 5  French vascular sheath. A C2 cobra catheter was advanced over a Bentson wire into the abdominal aorta. The catheter was used to select the left gastric artery which arises directly from the aorta. Catheter angiography was performed. Successful coil embolization of the left gastric artery on the prior angiogram. There is no significant perfusion in the region of the vascular clip. The C2 cobra catheter was then advanced into the celiac artery and a celiac arteriogram was performed. The vessels are significantly attenuated consistent with diffuse vaso spasm. Branches of the short gastric arteries are identified in the region of the previously placed endoscopic clip. Using a glidewire, the 5 [place] catheter was successfully advanced into the splenic artery. Angiography was then performed in multiple obliquities. The splenic artery divides into a superior and inferior division. Both divisions give rise to some a short gastric arteries. A Renegade high flow microcatheter was successfully advanced into the distal splenic artery and then into the superior division. Superior division short gastric arteries appear to provide blood flow more superiorly along the gastric fundus. The microcatheter was then successfully navigated into the inferior division of the splenic artery. The short gastric arteries are in close proximity to the endovascular clip. No definite active extravasation was identified. The decision was made to perform coil embolization. This will likely devascularize approximately 30-40% of the lower pole of the spleen. Coil embolization was performed using a combination of 2 and 3 mm interlock and vortex coils. Post embolization arteriography demonstrates complete cessation of flow in the arteries in the region of the previously placed endovascular clip. The catheters were removed. Hemostasis was attained with the assistance of an ExoSeal extra arterial vascular plug. IMPRESSION: 1. Successful prior coil  embolization of the left gastric artery. 2. Coil embolization of the inferior division of the splenic artery and is associated short gastric arteries in the region of the previously placed endovascular clip. Angiography and embolization therapy has now been maximized. If the patient continues to hemorrhage, repeat endoscopy or surgery would likely be required. Signed, Criselda Peaches, MD [facility]iology [facility] Radiology Electronically Signed   By: Jacqulynn Cadet M.D.   On: 04/13/2017 19:09  STUDIES:  CTA A/P 1/26 > Significant aortic atherosclerosis, with focal high-grade aortic stenosis secondary to mixed calcified and soft plaque, occurring at the level of the inferior mesenteric artery. If there is concern for short distance claudication, correlation with noninvasive lower extremity exam may be useful. Aortic Atherosclerosis (ICD10-I70.0). Atherosclerotic changes at the origin of the mesenteric arteries, however, only the IMA demonstrates evidence of high-grade stenosis, and there appears to be good collateral flow to the IMA distribution secondary to enlarged meandering mesenteric artery/arc of [place]. It is uncertain on the CTA as to what degree atherosclerotic changes/stenosis of the common hepatic artery are contributing to flow compromise. There are 2 right renal arteries and 3 left renal arteries. Atherosclerotic changes involve the origin of all of the renal arteries, with at least 50% stenosis secondary to mixed calcified and soft plaque at the origin of the largest bilateral renal Arteries. ECHO 1/28 > The estimated ejection fraction was in the range of 55% to 60%. Wall motion was normal; there were no regional wall motion abnormalities. Features are consistent with a pseudonormal left ventricular filling pattern, with concomitant abnormal relaxation and increased filling pressure (grade 2 diastolic dysfunction). NM 1/29 > No active bleed  CTA Chest  1/29 > 1. 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 Recommend tissue sampling. 2. No pulmonary embolism seen, with mild study limitations detailed Above. 3. Small right pleural effusion.  Mild bibasilar atelectasis. 4. Mild emphysematous changes within the lung apices VAS [place] Lower Venous 1/29 > DVT in Right Gastrocnemius  EGD 1/25 > - Normal esophagus. - A single visible vessel in the setting of a mild ulceration was found in the stomach. Clip (MR unsafe) was placed- Normal examined duodenum.- No specimens collected. Colonoscopy 1/31 >  Diverticulosis in the sigmoid colon. CT ABD 2/5 >>  1. No evidence for retroperitoneal hematoma. 2. Striated nephrographic appearance of both kidneys identified, right greater than left. Imaging findings are favored to represent sequelae of acute tubular necrosis. Not excluded but favored less likely would be an embolic phenomenon resulting in areas of renal infarct. 3. Left-sided hydronephrosis and hydroureter. Etiology indeterminate. Cannot rule out distal ureteral obstruction. 4. Bilateral pleural effusions, ascites and diffuse body wall edema compatible with fluid overload. 5. Intermediate to high attenuation material within the stomach. Although conceivably this could be related to it administration of enteric contrast material ongoing bleeding within the gastric lumen is not excluded.  CULTURES: MRSA 1/24 > Negative  Blood 1/26 > Negative  Urine Culture 1/27 > Negative  Blood 1/14 x 2 > E.coli  ANTIBIOTICS: Zosyn 1/24 > 1/26 Vancomycin 1/24 > 1/26 Rocephin 1/26 > 2/3  SIGNIFICANT EVENTS: 1/24  Presents to ED  1/25  EGD- gastric ulcer clipped 1/31  colonoscopy-> diverticulosis 1/29  + RLE DVT, xarelto started 2/2    Hemoptysis; xarelto stopped, transfused 2/3   Hypotensive, AMS, intubated; IR embolization, IVC filter  2/4    Ongoing bleeding, TXA and blood  products; IR intervention  LINES/TUBES: ETT 2/3 >>  PIV  LIJ 2/3 >> 2/4 R SVC 2/4 >> Foley 2/3 >>  DISCUSSION: 62 year old female presents with melena and ABD pain. EGD on 1/25 with single visible vessel in the setting of mild ulceration where clips was placed. Patient continued to have hypotension and bleeding. On 1/31 underwent colonoscopy in which revealed diverticulosis. During stay patient was found to have right LE DVT and started on Xarelto on 2/1.  2/2 patient had hemoptysis and Xarelto was stopped, given 4 units RBCs. 2/3 in the early morning patient was found minimally responsive and hypotensive, requiring intubation. 2 u PRBC , 1 pk platelets and Kcentra.  2/4, continued anemia, back to IR for embolization, transfused again and TXA given.  ASSESSMENT / PLAN:  PULMONARY A: Acute Respiratory Failure in setting of severe  GI Bleed /Shock   4.1 cm mass in the aortopulmonary window  Pleural effusions - CXR reviewed,  P:   Vent Support  Daily SBT if meets criteria, hopeful to start weaning this afternoon if remains stable/ no further bleeding/ continued weaning of pressors VAP Bundle  Trend CXR/ABG  Mass workup only once more stable  Unable to diuresis secondary to hypotension/ pressors requirements  CARDIOVASCULAR A:  Hemorraghic Shock - improving A.Fib RVR - resolved  Diastolic HF (T1YH EF 88-87)  H/O HTN, HLD  P:  Cardiac Monitoring  Continue to wean levophed, vasopressin and titrate for MAP > 65   RENAL A:   AKI - likely related to IV dye exposure x 2  Anion Gap Metabolic Acidosis/ lactic acidosis- improving  - r Hypokalemia - resolved Hypomagnesemia - resolved Hypernatremia - resolved  Possible distal ureteral obstruction- per CT abd  Anasarca  - net + 33.4 L  P:   Stop IVF Diurese when able Trend BMP /Mag/ Phos/ urinary output Replace electrolytes as indicated   GASTROINTESTINAL A:   UGIB - due to gastric ulcer, s/p clipping 1/25 and coil  embolization by IR 2/3, IR embolization 2/4 Melena, Hemoptysis - resolved Transaminitis > Improved  Uterine Prolapse   - CT abd neg for retroperitoneal bleed P:   GI Following, if Hgb drops again, will repeat EGD NPO PPI BID Trend H&H q6H   HEMATOLOGIC A:   Acute Blood Loss Anemia secondary to UGIB  RLE DVT - s/p IVC filter placement Thrombocytopenia  P:  Hold all anticoagulation (Xarelto started on 2/1, received 2 doses, stopped due to bleeding and also given Kcentra 2/3) Repeat coags at 12 today Trend CBC   INFECTIOUS A:   E.Coli bacteremia - s/p course of ceftriaxone P:   Monitor clinically Trend WBC/ fever curver  ENDOCRINE A:   Hyperglycemia    P:   Trend Glucose  SSI   NEUROLOGIC A:   Metabolic Encephalopathy  P:   Fentanyl gtt / Versed PRN to achieve RASS RASS goal: 0/-1 Daily WUA  FAMILY  - Updates: Family updated 2/3.  None at bedside 2/4.  No family at bedside 2/5.  - Inter-disciplinary family meet or Palliative Care meeting due by: 04/19/2017   CCT 45 mins.  Kennieth Rad, AGACNP-BC [facility] Pgr: [phone] or if no answer [phone] 04/14/2017, 7:31 AM

## 2017-04-14 NOTE — Progress Notes (Signed)
[facility] Progress Note Patient Name: Kayla Wyatt DOB: 30-Mar-1955 MRN: [id number]   Date of Service  04/14/2017  HPI/Events of Note  K+ = 3.8, Mg++ = 2.0, PO4--- = 5.6 and Creatinine = 1.59. No effective way to reduce PO4---.   eICU Interventions  Will orderL 1. Replace K+ given ectomy. 2. Replace Ca++.      Intervention Category Major Interventions: Electrolyte abnormality - evaluation and management  Deniss Wormley [place] 04/14/2017, 2:17 AM

## 2017-04-14 NOTE — Progress Notes (Signed)
2 Days Post-Op   Subjective/Chief Complaint: Awake on vent  NGT output bilious no blood  HCT stable  Some bloody BM    Objective: Vital signs in last 24 hours: Temp:  [98.6 F (37 C)-100.3 F (37.9 C)] 98.9 F (37.2 C) (02/05 0750) Pulse Rate:  [25-124] 71 (02/05 0750) Resp:  [9-27] 27 (02/05 0750) BP: (82-187)/(45-137) 158/137 (02/05 0750) SpO2:  [72 %-100 %] 96 % (02/05 0750) Arterial Line BP: (66-169)/(36-88) 74/58 (02/05 0615) FiO2 (%):  [30 %-40 %] 30 % (02/05 0750) Weight:  [87.6 kg (193 lb 2 oz)] 87.6 kg (193 lb 2 oz) (02/05 0346) Last BM Date: 04/13/17  Intake/Output from previous day: 02/04 0701 - 02/05 0700 In: 5081.6 [I.V.:3253.1; Blood:1558.5; IV Piggyback:270] Out: 525 [Urine:525] Intake/Output this shift: No intake/output data recorded.  GI: soft NT ND   Lab Results:  Recent Labs    04/13/17 0346  04/13/17 2325 04/14/17 0338  WBC 23.5*  --   --  22.6*  HGB 4.5*   < > 9.3* 8.9*  HCT 13.0*   < > 27.2* 26.0*  PLT 66*  --   --  72*   < > = values in this interval not displayed.   BMET Recent Labs    04/13/17 0346 04/14/17 0044  NA 145 146*  K 3.9 3.8  CL 105 103  CO2 28 31  GLUCOSE 223* 131*  BUN 12 16  CREATININE 1.46* 1.59*  CALCIUM 6.4* 7.2*   PT/INR Recent Labs    04/13/17 0926  LABPROT 22.3*  INR 1.97   ABG Recent Labs    04/13/17 0914 04/14/17 0404  PHART 7.460* 7.467*  HCO3 30.9* 33.9*    Studies/Results: Ct Abdomen Pelvis Wo Contrast  Result Date: 04/13/2017 CLINICAL DATA:  Evaluate for retroperitoneal hematoma status post arterial access. EXAM: CT ABDOMEN AND PELVIS WITHOUT CONTRAST TECHNIQUE: Multidetector CT imaging of the abdomen and pelvis was performed following the standard protocol without IV contrast. COMPARISON:  04/03/2017 FINDINGS: Lower chest: Moderate bilateral pleural effusions with overlying compressive type atelectasis. Hepatobiliary: No focal liver abnormality. Vicarious excretion of contrast material  into the gallbladder is noted. Pancreas: Unremarkable. No pancreatic ductal dilatation or surrounding inflammatory changes. Spleen: Normal in size without focal abnormality. Adrenals/Urinary Tract: Normal adrenal glands. There is a striated nephrographic appearance of both kidneys, right greater than left. IV contrast material is identified within a dilated left renal collecting system and ureter. Asymmetrically diminished contrast within the right renal collecting system identified. Urinary bladder collapsed around a Foley catheter. Stomach/Bowel: There is a gastrostomy tube with tip in the distal stomach. Moderate to increased attenuation material is identified within the gastric lumen, image 17 of series 3. There also high attenuation material is identified within the small and large bowel loops. Vascular/Lymphatic: Aortic atherosclerosis. No aneurysm. Filter identified within the IVC. There is no adenopathy. Reproductive: Status post hysterectomy. No adnexal masses. Other: There is a small volume of ascites identified. Small to moderate volume of ascites within the right upper quadrant of the abdomen and right lower quadrant of the abdomen and pelvis. There is diffuse body wall edema compatible with anasarca. No retroperitoneal hematoma identified. Musculoskeletal: No acute or significant osseous findings. IMPRESSION: 1. No evidence for retroperitoneal hematoma. 2. Striated nephrographic appearance of both kidneys identified, right greater than left. Imaging findings are favored to represent sequelae of acute tubular necrosis. Not excluded but favored less likely would be an embolic phenomenon resulting in areas of renal infarct. 3. Left-sided hydronephrosis  and hydroureter. Etiology indeterminate. Cannot rule out distal ureteral obstruction. 4. Bilateral pleural effusions, ascites and diffuse body wall edema compatible with fluid overload. 5. Intermediate to high attenuation material within the stomach.  Although conceivably this could be related to it administration of enteric contrast material ongoing bleeding within the gastric lumen is not excluded. 6.  Aortic Atherosclerosis (ICD10-I70.0). Electronically Signed   By: Kerby Moors M.D.   On: 04/13/2017 12:00   Ir Angiogram Visceral Selective  Result Date: 04/13/2017 INDICATION: 62 year old female with gastric ulcer and a visible bleeding vessel. Patient underwent embolization yesterday but continues to demonstrate evidence of upper GI bleeding. Repeat angiography is requested. EXAM: IR EMBO ART VEN HEMORR LYMPH EXTRAV INC GUIDE ROADMAPPING; ARTERIOGRAPHY; IR ULTRASOUND GUIDANCE VASC ACCESS RIGHT; SELECTIVE VISCERAL ARTERIOGRAPHY; ADDITIONAL ARTERIOGRAPHY; IR RIGHT FLOURO GUIDE CV LINE 1. Ultrasound-guided access right common femoral artery 2. Catheterization and angiogram of the left gastric artery 3. Catheterization and angiogram the celiac axis 4. Catheterization and angiogram of the splenic artery 5. Catheterization and angiogram of the superior division of the splenic artery 6. Catheterization and angiogram of the inferior division of the splenic artery 7. Coil embolization of the inferior division of the splenic artery MEDICATIONS: None ANESTHESIA/SEDATION: Moderate (conscious) sedation was employed during this procedure. A total of Versed 1 mg was administered intravenously. The patient was on a fentanyl drip. Moderate Sedation Time: 60 minutes. The patient's level of consciousness and vital signs were monitored continuously by radiology nursing throughout the procedure under my direct supervision. CONTRAST:  80 mL Isovue 300 FLUOROSCOPY TIME:  Fluoroscopy Time: 13 minutes 6 seconds (1299 mGy). COMPLICATIONS: None immediate. PROCEDURE: Informed consent was obtained from the patient following explanation of the procedure, risks, benefits and alternatives. The patient understands, agrees and consents for the procedure. All questions were addressed. A  time out was performed prior to the initiation of the procedure. Maximal barrier sterile technique utilized including caps, mask, sterile gowns, sterile gloves, large sterile drape, hand hygiene, and Betadine prep. The right common femoral artery was interrogated with ultrasound and found to be widely patent. An image was obtained and stored for the medical record. Local anesthesia was attained by infiltration with 1% lidocaine. A small dermatotomy was made. Under real-time sonographic guidance, the vessel was punctured with a 21 gauge micropuncture needle. Using standard technique, the initial micro needle was exchanged over a 0.018 micro wire for a transitional 4 [place] micro sheath. The micro sheath was then exchanged over a 0.035 wire for a 5 French vascular sheath. A C2 cobra catheter was advanced over a Bentson wire into the abdominal aorta. The catheter was used to select the left gastric artery which arises directly from the aorta. Catheter angiography was performed. Successful coil embolization of the left gastric artery on the prior angiogram. There is no significant perfusion in the region of the vascular clip. The C2 cobra catheter was then advanced into the celiac artery and a celiac arteriogram was performed. The vessels are significantly attenuated consistent with diffuse vaso spasm. Branches of the short gastric arteries are identified in the region of the previously placed endoscopic clip. Using a glidewire, the 5 [place] catheter was successfully advanced into the splenic artery. Angiography was then performed in multiple obliquities. The splenic artery divides into a superior and inferior division. Both divisions give rise to some a short gastric arteries. A Renegade high flow microcatheter was successfully advanced into the distal splenic artery and then into the superior division. Superior division short gastric  arteries appear to provide blood flow more superiorly along the gastric fundus. The  microcatheter was then successfully navigated into the inferior division of the splenic artery. The short gastric arteries are in close proximity to the endovascular clip. No definite active extravasation was identified. The decision was made to perform coil embolization. This will likely devascularize approximately 30-40% of the lower pole of the spleen. Coil embolization was performed using a combination of 2 and 3 mm interlock and vortex coils. Post embolization arteriography demonstrates complete cessation of flow in the arteries in the region of the previously placed endovascular clip. The catheters were removed. Hemostasis was attained with the assistance of an ExoSeal extra arterial vascular plug. IMPRESSION: 1. Successful prior coil embolization of the left gastric artery. 2. Coil embolization of the inferior division of the splenic artery and is associated short gastric arteries in the region of the previously placed endovascular clip. Angiography and embolization therapy has now been maximized. If the patient continues to hemorrhage, repeat endoscopy or surgery would likely be required. Signed, Criselda Peaches, MD [facility] Radiology Electronically Signed   By: Jacqulynn Cadet M.D.   On: 04/13/2017 19:09   Ir Angiogram Selective Each Additional Vessel  Result Date: 04/13/2017 INDICATION: 62 year old female with gastric ulcer and a visible bleeding vessel. Patient underwent embolization yesterday but continues to demonstrate evidence of upper GI bleeding. Repeat angiography is requested. EXAM: IR EMBO ART VEN HEMORR LYMPH EXTRAV INC GUIDE ROADMAPPING; ARTERIOGRAPHY; IR ULTRASOUND GUIDANCE VASC ACCESS RIGHT; SELECTIVE VISCERAL ARTERIOGRAPHY; ADDITIONAL ARTERIOGRAPHY; IR RIGHT FLOURO GUIDE CV LINE 1. Ultrasound-guided access right common femoral artery 2. Catheterization and angiogram of the left gastric artery 3. Catheterization and angiogram the  celiac axis 4. Catheterization and angiogram of the splenic artery 5. Catheterization and angiogram of the superior division of the splenic artery 6. Catheterization and angiogram of the inferior division of the splenic artery 7. Coil embolization of the inferior division of the splenic artery MEDICATIONS: None ANESTHESIA/SEDATION: Moderate (conscious) sedation was employed during this procedure. A total of Versed 1 mg was administered intravenously. The patient was on a fentanyl drip. Moderate Sedation Time: 60 minutes. The patient's level of consciousness and vital signs were monitored continuously by radiology nursing throughout the procedure under my direct supervision. CONTRAST:  80 mL Isovue 300 FLUOROSCOPY TIME:  Fluoroscopy Time: 13 minutes 6 seconds (1299 mGy). COMPLICATIONS: None immediate. PROCEDURE: Informed consent was obtained from the patient following explanation of the procedure, risks, benefits and alternatives. The patient understands, agrees and consents for the procedure. All questions were addressed. A time out was performed prior to the initiation of the procedure. Maximal barrier sterile technique utilized including caps, mask, sterile gowns, sterile gloves, large sterile drape, hand hygiene, and Betadine prep. The right common femoral artery was interrogated with ultrasound and found to be widely patent. An image was obtained and stored for the medical record. Local anesthesia was attained by infiltration with 1% lidocaine. A small dermatotomy was made. Under real-time sonographic guidance, the vessel was punctured with a 21 gauge micropuncture needle. Using standard technique, the initial micro needle was exchanged over a 0.018 micro wire for a transitional 4 [place] micro sheath. The micro sheath was then exchanged over a 0.035 wire for a 5 French vascular sheath. A C2 cobra catheter was advanced over a Bentson wire into the abdominal aorta. The catheter was used to select the left gastric  artery which arises directly from the aorta. Catheter angiography was performed.  Successful coil embolization of the left gastric artery on the prior angiogram. There is no significant perfusion in the region of the vascular clip. The C2 cobra catheter was then advanced into the celiac artery and a celiac arteriogram was performed. The vessels are significantly attenuated consistent with diffuse vaso spasm. Branches of the short gastric arteries are identified in the region of the previously placed endoscopic clip. Using a glidewire, the 5 [place] catheter was successfully advanced into the splenic artery. Angiography was then performed in multiple obliquities. The splenic artery divides into a superior and inferior division. Both divisions give rise to some a short gastric arteries. A Renegade high flow microcatheter was successfully advanced into the distal splenic artery and then into the superior division. Superior division short gastric arteries appear to provide blood flow more superiorly along the gastric fundus. The microcatheter was then successfully navigated into the inferior division of the splenic artery. The short gastric arteries are in close proximity to the endovascular clip. No definite active extravasation was identified. The decision was made to perform coil embolization. This will likely devascularize approximately 30-40% of the lower pole of the spleen. Coil embolization was performed using a combination of 2 and 3 mm interlock and vortex coils. Post embolization arteriography demonstrates complete cessation of flow in the arteries in the region of the previously placed endovascular clip. The catheters were removed. Hemostasis was attained with the assistance of an ExoSeal extra arterial vascular plug. IMPRESSION: 1. Successful prior coil embolization of the left gastric artery. 2. Coil embolization of the inferior division of the splenic artery and is associated short gastric arteries in the  region of the previously placed endovascular clip. Angiography and embolization therapy has now been maximized. If the patient continues to hemorrhage, repeat endoscopy or surgery would likely be required. Signed, Criselda Peaches, MD [facility] Radiology Electronically Signed   By: Jacqulynn Cadet M.D.   On: 04/13/2017 19:09   Ir Angiogram Selective Each Additional Vessel  Result Date: 04/13/2017 INDICATION: 62 year old female with gastric ulcer and a visible bleeding vessel. Patient underwent embolization yesterday but continues to demonstrate evidence of upper GI bleeding. Repeat angiography is requested. EXAM: IR EMBO ART VEN HEMORR LYMPH EXTRAV INC GUIDE ROADMAPPING; ARTERIOGRAPHY; IR ULTRASOUND GUIDANCE VASC ACCESS RIGHT; SELECTIVE VISCERAL ARTERIOGRAPHY; ADDITIONAL ARTERIOGRAPHY; IR RIGHT FLOURO GUIDE CV LINE 1. Ultrasound-guided access right common femoral artery 2. Catheterization and angiogram of the left gastric artery 3. Catheterization and angiogram the celiac axis 4. Catheterization and angiogram of the splenic artery 5. Catheterization and angiogram of the superior division of the splenic artery 6. Catheterization and angiogram of the inferior division of the splenic artery 7. Coil embolization of the inferior division of the splenic artery MEDICATIONS: None ANESTHESIA/SEDATION: Moderate (conscious) sedation was employed during this procedure. A total of Versed 1 mg was administered intravenously. The patient was on a fentanyl drip. Moderate Sedation Time: 60 minutes. The patient's level of consciousness and vital signs were monitored continuously by radiology nursing throughout the procedure under my direct supervision. CONTRAST:  80 mL Isovue 300 FLUOROSCOPY TIME:  Fluoroscopy Time: 13 minutes 6 seconds (1299 mGy). COMPLICATIONS: None immediate. PROCEDURE: Informed consent was obtained from the patient following explanation of the procedure,  risks, benefits and alternatives. The patient understands, agrees and consents for the procedure. All questions were addressed. A time out was performed prior to the initiation of the procedure. Maximal barrier sterile technique utilized including caps, mask, sterile gowns, sterile gloves, large  sterile drape, hand hygiene, and Betadine prep. The right common femoral artery was interrogated with ultrasound and found to be widely patent. An image was obtained and stored for the medical record. Local anesthesia was attained by infiltration with 1% lidocaine. A small dermatotomy was made. Under real-time sonographic guidance, the vessel was punctured with a 21 gauge micropuncture needle. Using standard technique, the initial micro needle was exchanged over a 0.018 micro wire for a transitional 4 [place] micro sheath. The micro sheath was then exchanged over a 0.035 wire for a 5 French vascular sheath. A C2 cobra catheter was advanced over a Bentson wire into the abdominal aorta. The catheter was used to select the left gastric artery which arises directly from the aorta. Catheter angiography was performed. Successful coil embolization of the left gastric artery on the prior angiogram. There is no significant perfusion in the region of the vascular clip. The C2 cobra catheter was then advanced into the celiac artery and a celiac arteriogram was performed. The vessels are significantly attenuated consistent with diffuse vaso spasm. Branches of the short gastric arteries are identified in the region of the previously placed endoscopic clip. Using a glidewire, the 5 [place] catheter was successfully advanced into the splenic artery. Angiography was then performed in multiple obliquities. The splenic artery divides into a superior and inferior division. Both divisions give rise to some a short gastric arteries. A Renegade high flow microcatheter was successfully advanced into the distal splenic artery and then into the  superior division. Superior division short gastric arteries appear to provide blood flow more superiorly along the gastric fundus. The microcatheter was then successfully navigated into the inferior division of the splenic artery. The short gastric arteries are in close proximity to the endovascular clip. No definite active extravasation was identified. The decision was made to perform coil embolization. This will likely devascularize approximately 30-40% of the lower pole of the spleen. Coil embolization was performed using a combination of 2 and 3 mm interlock and vortex coils. Post embolization arteriography demonstrates complete cessation of flow in the arteries in the region of the previously placed endovascular clip. The catheters were removed. Hemostasis was attained with the assistance of an ExoSeal extra arterial vascular plug. IMPRESSION: 1. Successful prior coil embolization of the left gastric artery. 2. Coil embolization of the inferior division of the splenic artery and is associated short gastric arteries in the region of the previously placed endovascular clip. Angiography and embolization therapy has now been maximized. If the patient continues to hemorrhage, repeat endoscopy or surgery would likely be required. Signed, Criselda Peaches, MD [facility] Radiology Electronically Signed   By: Jacqulynn Cadet M.D.   On: 04/13/2017 19:09   Ir Fluoro Guide Cv Line Right  Result Date: 04/13/2017 INDICATION: 62 year old female with gastric ulcer and a visible bleeding vessel. Patient underwent embolization yesterday but continues to demonstrate evidence of upper GI bleeding. Repeat angiography is requested. EXAM: IR EMBO ART VEN HEMORR LYMPH EXTRAV INC GUIDE ROADMAPPING; ARTERIOGRAPHY; IR ULTRASOUND GUIDANCE VASC ACCESS RIGHT; SELECTIVE VISCERAL ARTERIOGRAPHY; ADDITIONAL ARTERIOGRAPHY; IR RIGHT FLOURO GUIDE CV LINE 1. Ultrasound-guided access right  common femoral artery 2. Catheterization and angiogram of the left gastric artery 3. Catheterization and angiogram the celiac axis 4. Catheterization and angiogram of the splenic artery 5. Catheterization and angiogram of the superior division of the splenic artery 6. Catheterization and angiogram of the inferior division of the splenic artery 7. Coil embolization of the inferior division of  the splenic artery MEDICATIONS: None ANESTHESIA/SEDATION: Moderate (conscious) sedation was employed during this procedure. A total of Versed 1 mg was administered intravenously. The patient was on a fentanyl drip. Moderate Sedation Time: 60 minutes. The patient's level of consciousness and vital signs were monitored continuously by radiology nursing throughout the procedure under my direct supervision. CONTRAST:  80 mL Isovue 300 FLUOROSCOPY TIME:  Fluoroscopy Time: 13 minutes 6 seconds (1299 mGy). COMPLICATIONS: None immediate. PROCEDURE: Informed consent was obtained from the patient following explanation of the procedure, risks, benefits and alternatives. The patient understands, agrees and consents for the procedure. All questions were addressed. A time out was performed prior to the initiation of the procedure. Maximal barrier sterile technique utilized including caps, mask, sterile gowns, sterile gloves, large sterile drape, hand hygiene, and Betadine prep. The right common femoral artery was interrogated with ultrasound and found to be widely patent. An image was obtained and stored for the medical record. Local anesthesia was attained by infiltration with 1% lidocaine. A small dermatotomy was made. Under real-time sonographic guidance, the vessel was punctured with a 21 gauge micropuncture needle. Using standard technique, the initial micro needle was exchanged over a 0.018 micro wire for a transitional 4 [place] micro sheath. The micro sheath was then exchanged over a 0.035 wire for a 5 French vascular sheath. A C2  cobra catheter was advanced over a Bentson wire into the abdominal aorta. The catheter was used to select the left gastric artery which arises directly from the aorta. Catheter angiography was performed. Successful coil embolization of the left gastric artery on the prior angiogram. There is no significant perfusion in the region of the vascular clip. The C2 cobra catheter was then advanced into the celiac artery and a celiac arteriogram was performed. The vessels are significantly attenuated consistent with diffuse vaso spasm. Branches of the short gastric arteries are identified in the region of the previously placed endoscopic clip. Using a glidewire, the 5 [place] catheter was successfully advanced into the splenic artery. Angiography was then performed in multiple obliquities. The splenic artery divides into a superior and inferior division. Both divisions give rise to some a short gastric arteries. A Renegade high flow microcatheter was successfully advanced into the distal splenic artery and then into the superior division. Superior division short gastric arteries appear to provide blood flow more superiorly along the gastric fundus. The microcatheter was then successfully navigated into the inferior division of the splenic artery. The short gastric arteries are in close proximity to the endovascular clip. No definite active extravasation was identified. The decision was made to perform coil embolization. This will likely devascularize approximately 30-40% of the lower pole of the spleen. Coil embolization was performed using a combination of 2 and 3 mm interlock and vortex coils. Post embolization arteriography demonstrates complete cessation of flow in the arteries in the region of the previously placed endovascular clip. The catheters were removed. Hemostasis was attained with the assistance of an ExoSeal extra arterial vascular plug. IMPRESSION: 1. Successful prior coil embolization of the left gastric  artery. 2. Coil embolization of the inferior division of the splenic artery and is associated short gastric arteries in the region of the previously placed endovascular clip. Angiography and embolization therapy has now been maximized. If the patient continues to hemorrhage, repeat endoscopy or surgery would likely be required. Signed, Criselda Peaches, MD [facility] Radiology Electronically Signed   By: Jacqulynn Cadet M.D.   On: 04/13/2017 19:09  Ir US Guide Vasc Access Right  Result Date: 04/13/2017 INDICATION: 62 year old female with gastric ulcer and a visible bleeding vessel. Patient underwent embolization yesterday but continues to demonstrate evidence of upper GI bleeding. Repeat angiography is requested. EXAM: IR EMBO ART VEN HEMORR LYMPH EXTRAV INC GUIDE ROADMAPPING; ARTERIOGRAPHY; IR ULTRASOUND GUIDANCE VASC ACCESS RIGHT; SELECTIVE VISCERAL ARTERIOGRAPHY; ADDITIONAL ARTERIOGRAPHY; IR RIGHT FLOURO GUIDE CV LINE 1. Ultrasound-guided access right common femoral artery 2. Catheterization and angiogram of the left gastric artery 3. Catheterization and angiogram the celiac axis 4. Catheterization and angiogram of the splenic artery 5. Catheterization and angiogram of the superior division of the splenic artery 6. Catheterization and angiogram of the inferior division of the splenic artery 7. Coil embolization of the inferior division of the splenic artery MEDICATIONS: None ANESTHESIA/SEDATION: Moderate (conscious) sedation was employed during this procedure. A total of Versed 1 mg was administered intravenously. The patient was on a fentanyl drip. Moderate Sedation Time: 60 minutes. The patient's level of consciousness and vital signs were monitored continuously by radiology nursing throughout the procedure under my direct supervision. CONTRAST:  80 mL Isovue 300 FLUOROSCOPY TIME:  Fluoroscopy Time: 13 minutes 6 seconds (1299 mGy). COMPLICATIONS: None  immediate. PROCEDURE: Informed consent was obtained from the patient following explanation of the procedure, risks, benefits and alternatives. The patient understands, agrees and consents for the procedure. All questions were addressed. A time out was performed prior to the initiation of the procedure. Maximal barrier sterile technique utilized including caps, mask, sterile gowns, sterile gloves, large sterile drape, hand hygiene, and Betadine prep. The right common femoral artery was interrogated with ultrasound and found to be widely patent. An image was obtained and stored for the medical record. Local anesthesia was attained by infiltration with 1% lidocaine. A small dermatotomy was made. Under real-time sonographic guidance, the vessel was punctured with a 21 gauge micropuncture needle. Using standard technique, the initial micro needle was exchanged over a 0.018 micro wire for a transitional 4 [place] micro sheath. The micro sheath was then exchanged over a 0.035 wire for a 5 French vascular sheath. A C2 cobra catheter was advanced over a Bentson wire into the abdominal aorta. The catheter was used to select the left gastric artery which arises directly from the aorta. Catheter angiography was performed. Successful coil embolization of the left gastric artery on the prior angiogram. There is no significant perfusion in the region of the vascular clip. The C2 cobra catheter was then advanced into the celiac artery and a celiac arteriogram was performed. The vessels are significantly attenuated consistent with diffuse vaso spasm. Branches of the short gastric arteries are identified in the region of the previously placed endoscopic clip. Using a glidewire, the 5 [place] catheter was successfully advanced into the splenic artery. Angiography was then performed in multiple obliquities. The splenic artery divides into a superior and inferior division. Both divisions give rise to some a short gastric arteries. A  Renegade high flow microcatheter was successfully advanced into the distal splenic artery and then into the superior division. Superior division short gastric arteries appear to provide blood flow more superiorly along the gastric fundus. The microcatheter was then successfully navigated into the inferior division of the splenic artery. The short gastric arteries are in close proximity to the endovascular clip. No definite active extravasation was identified. The decision was made to perform coil embolization. This will likely devascularize approximately 30-40% of the lower pole of the spleen. Coil embolization was performed using a combination of 2  and 3 mm interlock and vortex coils. Post embolization arteriography demonstrates complete cessation of flow in the arteries in the region of the previously placed endovascular clip. The catheters were removed. Hemostasis was attained with the assistance of an ExoSeal extra arterial vascular plug. IMPRESSION: 1. Successful prior coil embolization of the left gastric artery. 2. Coil embolization of the inferior division of the splenic artery and is associated short gastric arteries in the region of the previously placed endovascular clip. Angiography and embolization therapy has now been maximized. If the patient continues to hemorrhage, repeat endoscopy or surgery would likely be required. Signed, Criselda Peaches, MD [facility] Radiology Electronically Signed   By: Jacqulynn Cadet M.D.   On: 04/13/2017 19:09   Ir US Guide Vasc Access Right  Result Date: 04/13/2017 INDICATION: 62 year old female with gastric ulcer and a visible bleeding vessel. Patient underwent embolization yesterday but continues to demonstrate evidence of upper GI bleeding. Repeat angiography is requested. EXAM: IR EMBO ART VEN HEMORR LYMPH EXTRAV INC GUIDE ROADMAPPING; ARTERIOGRAPHY; IR ULTRASOUND GUIDANCE VASC ACCESS RIGHT; SELECTIVE VISCERAL  ARTERIOGRAPHY; ADDITIONAL ARTERIOGRAPHY; IR RIGHT FLOURO GUIDE CV LINE 1. Ultrasound-guided access right common femoral artery 2. Catheterization and angiogram of the left gastric artery 3. Catheterization and angiogram the celiac axis 4. Catheterization and angiogram of the splenic artery 5. Catheterization and angiogram of the superior division of the splenic artery 6. Catheterization and angiogram of the inferior division of the splenic artery 7. Coil embolization of the inferior division of the splenic artery MEDICATIONS: None ANESTHESIA/SEDATION: Moderate (conscious) sedation was employed during this procedure. A total of Versed 1 mg was administered intravenously. The patient was on a fentanyl drip. Moderate Sedation Time: 60 minutes. The patient's level of consciousness and vital signs were monitored continuously by radiology nursing throughout the procedure under my direct supervision. CONTRAST:  80 mL Isovue 300 FLUOROSCOPY TIME:  Fluoroscopy Time: 13 minutes 6 seconds (1299 mGy). COMPLICATIONS: None immediate. PROCEDURE: Informed consent was obtained from the patient following explanation of the procedure, risks, benefits and alternatives. The patient understands, agrees and consents for the procedure. All questions were addressed. A time out was performed prior to the initiation of the procedure. Maximal barrier sterile technique utilized including caps, mask, sterile gowns, sterile gloves, large sterile drape, hand hygiene, and Betadine prep. The right common femoral artery was interrogated with ultrasound and found to be widely patent. An image was obtained and stored for the medical record. Local anesthesia was attained by infiltration with 1% lidocaine. A small dermatotomy was made. Under real-time sonographic guidance, the vessel was punctured with a 21 gauge micropuncture needle. Using standard technique, the initial micro needle was exchanged over a 0.018 micro wire for a transitional 4 [place]  micro sheath. The micro sheath was then exchanged over a 0.035 wire for a 5 French vascular sheath. A C2 cobra catheter was advanced over a Bentson wire into the abdominal aorta. The catheter was used to select the left gastric artery which arises directly from the aorta. Catheter angiography was performed. Successful coil embolization of the left gastric artery on the prior angiogram. There is no significant perfusion in the region of the vascular clip. The C2 cobra catheter was then advanced into the celiac artery and a celiac arteriogram was performed. The vessels are significantly attenuated consistent with diffuse vaso spasm. Branches of the short gastric arteries are identified in the region of the previously placed endoscopic clip. Using a glidewire, the 5 [place] catheter  was successfully advanced into the splenic artery. Angiography was then performed in multiple obliquities. The splenic artery divides into a superior and inferior division. Both divisions give rise to some a short gastric arteries. A Renegade high flow microcatheter was successfully advanced into the distal splenic artery and then into the superior division. Superior division short gastric arteries appear to provide blood flow more superiorly along the gastric fundus. The microcatheter was then successfully navigated into the inferior division of the splenic artery. The short gastric arteries are in close proximity to the endovascular clip. No definite active extravasation was identified. The decision was made to perform coil embolization. This will likely devascularize approximately 30-40% of the lower pole of the spleen. Coil embolization was performed using a combination of 2 and 3 mm interlock and vortex coils. Post embolization arteriography demonstrates complete cessation of flow in the arteries in the region of the previously placed endovascular clip. The catheters were removed. Hemostasis was attained with the assistance of an  ExoSeal extra arterial vascular plug. IMPRESSION: 1. Successful prior coil embolization of the left gastric artery. 2. Coil embolization of the inferior division of the splenic artery and is associated short gastric arteries in the region of the previously placed endovascular clip. Angiography and embolization therapy has now been maximized. If the patient continues to hemorrhage, repeat endoscopy or surgery would likely be required. Signed, Criselda Peaches, MD [facility] Radiology Electronically Signed   By: Jacqulynn Cadet M.D.   On: 04/13/2017 19:09   Dg Chest Port 1 View  Result Date: 04/14/2017 CLINICAL DATA:  Respiratory failure EXAM: PORTABLE CHEST 1 VIEW COMPARISON:  04/13/2017 FINDINGS: Cardiac shadow is within normal limits. Endotracheal tube and nasogastric catheter are again noted in satisfactory position. Previously seen left jugular line is been removed and a new right jugular line placed in satisfactory position. Bilateral pleural effusions right greater than left are noted with associated atelectatic changes. IMPRESSION: Increasing bilateral pleural effusions and atelectasis. Electronically Signed   By: Inez Catalina M.D.   On: 04/14/2017 06:59   Dg Chest Port 1 View  Result Date: 04/13/2017 CLINICAL DATA:  Respiratory failure EXAM: PORTABLE CHEST 1 VIEW COMPARISON:  Portable chest x-ray of April 12, 2017 FINDINGS: The lungs are reasonably well inflated. There is persistent increased density at both lung bases greatest on the left. The heart is normal in size. The pulmonary vascularity is less engorged today. The endotracheal tube tip projects 4.5 cm above the carina. The left internal jugular venous catheter tip projects at the junction of the right and left brachiocephalic veins. IMPRESSION: Decreased pulmonary edema. Persistent bibasilar subsegmental atelectasis or pneumonia greatest on the left. Electronically Signed   By: David   [place]ique M.D.   On: 04/13/2017 07:00   Dg Abd Portable 1v  Result Date: 04/13/2017 CLINICAL DATA:  62 year old female with nasogastric tube placement. Subsequent encounter. EXAM: PORTABLE ABDOMEN - 1 VIEW COMPARISON:  04/13/2017 CT FINDINGS: Nasogastric tube tip pylorus level with side hole body/antrum junction. Residual contrast within dilated left renal collecting system. Abnormal bowel gas pattern.  Please see CT report. IMPRESSION: Nasogastric tube tip gastric pylorus level with side hole gastric body/antrum junction. Electronically Signed   By: Genia Del M.D.   On: 04/13/2017 15:36   [place] Guide Roadmapping  Result Date: 04/13/2017 INDICATION: 62 year old female with gastric ulcer and a visible bleeding vessel. Patient underwent embolization yesterday but continues to demonstrate evidence of  upper GI bleeding. Repeat angiography is requested. EXAM: IR EMBO ART VEN HEMORR LYMPH EXTRAV INC GUIDE ROADMAPPING; ARTERIOGRAPHY; IR ULTRASOUND GUIDANCE VASC ACCESS RIGHT; SELECTIVE VISCERAL ARTERIOGRAPHY; ADDITIONAL ARTERIOGRAPHY; IR RIGHT FLOURO GUIDE CV LINE 1. Ultrasound-guided access right common femoral artery 2. Catheterization and angiogram of the left gastric artery 3. Catheterization and angiogram the celiac axis 4. Catheterization and angiogram of the splenic artery 5. Catheterization and angiogram of the superior division of the splenic artery 6. Catheterization and angiogram of the inferior division of the splenic artery 7. Coil embolization of the inferior division of the splenic artery MEDICATIONS: None ANESTHESIA/SEDATION: Moderate (conscious) sedation was employed during this procedure. A total of Versed 1 mg was administered intravenously. The patient was on a fentanyl drip. Moderate Sedation Time: 60 minutes. The patient's level of consciousness and vital signs were monitored continuously by radiology nursing throughout the procedure under my direct  supervision. CONTRAST:  80 mL Isovue 300 FLUOROSCOPY TIME:  Fluoroscopy Time: 13 minutes 6 seconds (1299 mGy). COMPLICATIONS: None immediate. PROCEDURE: Informed consent was obtained from the patient following explanation of the procedure, risks, benefits and alternatives. The patient understands, agrees and consents for the procedure. All questions were addressed. A time out was performed prior to the initiation of the procedure. Maximal barrier sterile technique utilized including caps, mask, sterile gowns, sterile gloves, large sterile drape, hand hygiene, and Betadine prep. The right common femoral artery was interrogated with ultrasound and found to be widely patent. An image was obtained and stored for the medical record. Local anesthesia was attained by infiltration with 1% lidocaine. A small dermatotomy was made. Under real-time sonographic guidance, the vessel was punctured with a 21 gauge micropuncture needle. Using standard technique, the initial micro needle was exchanged over a 0.018 micro wire for a transitional 4 [place] micro sheath. The micro sheath was then exchanged over a 0.035 wire for a 5 French vascular sheath. A C2 cobra catheter was advanced over a Bentson wire into the abdominal aorta. The catheter was used to select the left gastric artery which arises directly from the aorta. Catheter angiography was performed. Successful coil embolization of the left gastric artery on the prior angiogram. There is no significant perfusion in the region of the vascular clip. The C2 cobra catheter was then advanced into the celiac artery and a celiac arteriogram was performed. The vessels are significantly attenuated consistent with diffuse vaso spasm. Branches of the short gastric arteries are identified in the region of the previously placed endoscopic clip. Using a glidewire, the 5 [place] catheter was successfully advanced into the splenic artery. Angiography was then performed in multiple obliquities.  The splenic artery divides into a superior and inferior division. Both divisions give rise to some a short gastric arteries. A Renegade high flow microcatheter was successfully advanced into the distal splenic artery and then into the superior division. Superior division short gastric arteries appear to provide blood flow more superiorly along the gastric fundus. The microcatheter was then successfully navigated into the inferior division of the splenic artery. The short gastric arteries are in close proximity to the endovascular clip. No definite active extravasation was identified. The decision was made to perform coil embolization. This will likely devascularize approximately 30-40% of the lower pole of the spleen. Coil embolization was performed using a combination of 2 and 3 mm interlock and vortex coils. Post embolization arteriography demonstrates complete cessation of flow in the arteries in the region of the previously placed endovascular clip. The catheters were removed.  Hemostasis was attained with the assistance of an ExoSeal extra arterial vascular plug. IMPRESSION: 1. Successful prior coil embolization of the left gastric artery. 2. Coil embolization of the inferior division of the splenic artery and is associated short gastric arteries in the region of the previously placed endovascular clip. Angiography and embolization therapy has now been maximized. If the patient continues to hemorrhage, repeat endoscopy or surgery would likely be required. Signed, Criselda Peaches, MD [facility] Radiology Electronically Signed   By: Jacqulynn Cadet M.D.   On: 04/13/2017 19:09    Anti-infectives: Anti-infectives (From admission, onward)   Start     Dose/Rate Route Frequency Ordered Stop   04/04/17 1500  cefTRIAXone (ROCEPHIN) 2 g in dextrose 5 % 50 mL IVPB  Status:  Discontinued     2 g 100 mL/hr over 30 Minutes Intravenous Every 24 hours 04/04/17 1307  04/12/17 0213   04/03/17 0400  vancomycin (VANCOCIN) 500 mg in sodium chloride 0.9 % 100 mL IVPB  Status:  Discontinued     500 mg 100 mL/hr over 60 Minutes Intravenous Every 12 hours 04/02/17 1630 04/04/17 1307   04/02/17 2200  piperacillin-tazobactam (ZOSYN) IVPB 3.375 g  Status:  Discontinued     3.375 g 12.5 mL/hr over 240 Minutes Intravenous Every 8 hours 04/02/17 1630 04/04/17 1307   04/02/17 1600  piperacillin-tazobactam (ZOSYN) IVPB 3.375 g     3.375 g 100 mL/hr over 30 Minutes Intravenous  Once 04/02/17 1546 04/02/17 1645   04/02/17 1600  vancomycin (VANCOCIN) IVPB 1000 mg/200 mL premix     1,000 mg 200 mL/hr over 60 Minutes Intravenous  Once 04/02/17 1546 04/02/17 1711      Assessment/Plan: Patient Active Problem List   Diagnosis Date Noted  . Encounter for intubation   . Respiratory failure ([place])   . Persistent atrial fibrillation with rapid ventricular response ([place]) 04/07/2017  . Gastrointestinal hemorrhage with melena   . Lower abdominal pain of unknown etiology   . Abnormal computed tomography angiography (CTA) of abdomen and pelvis   . Acute blood loss anemia 04/02/2017    No further signs of bleeding  May need repeat endoscopy at some point  No blood in NGT? Unusual given findings  Question another source  No acute surgical need at this point    LOS: 12 days    Joyice Faster Doral Ventrella 04/14/2017

## 2017-04-14 NOTE — Progress Notes (Signed)
PCCM interval Note  Notified by RN of critical Hgb of 5.1. Drawn from CVL, significant drop from 8.9 at 0338 this am.  No obvious source of bleeding.  Patient has clear, bilious drainage from OGT.  Patient has improved clinically, weaning vasopressor support.  Aline is positional, unable to draw blood.    P:  Repeat Hgb and coags stat If Hgb < 7, transfuse If Hgb is low, will call GI/Surgery back. Replace Aline   Kennieth Rad, AGACNP-BC [facility] Pgr: [phone] or if no answer [phone] 04/14/2017, 9:10 AM

## 2017-04-14 NOTE — Progress Notes (Signed)
Pt's Hemoglobin 5.1, but upon recheck hemoglobin 8.4.

## 2017-04-14 NOTE — Progress Notes (Signed)
Referring Physician(s): Dr [name]  Supervising Physician: Aletta Edouard  Patient Status:  [facility] - In-pt  Chief Complaint:  GI Bleed  Subjective:  Embolization 04/12/17 Again 04/13/17 No signs of bleeding today Hg 8.4 this am Dr [name] note this am:      Her HGB is stable and her transfusion requirements have stabilized after repeat IR embolization.  At this point it appears that the intervention was very successful.     Allergies: Patient has no known allergies.  Medications: Prior to Admission medications   Medication Sig Start Date End Date Taking? Authorizing Provider  aspirin 81 MG tablet Take 81 mg by mouth daily.   Yes [provider]  cholecalciferol (VITAMIN D) 1000 units tablet Take 1,000 Units by mouth 2 (two) times daily.   Yes [provider]  lisinopril-hydrochlorothiazide (PRINZIDE,ZESTORETIC) 20-12.5 MG tablet Take 1 tablet by mouth daily.   Yes [provider]  simvastatin (ZOCOR) 20 MG tablet Take 1 tablet by mouth daily. 01/02/17  Yes [provider]     Vital Signs: BP (!) 106/35   Pulse (!) 52   Temp 98.9 F (37.2 C) (Oral)   Resp 18   Ht '5\' 6"'$  (1.676 m)   Wt 193 lb 2 oz (87.6 kg)   SpO2 97%   BMI 31.17 kg/m   Physical Exam  Skin: Skin is warm and dry.  Groin site clean and dry NT no bleeding No hematoma B feet 1+ pulses +edema   Nursing note and vitals reviewed.   Imaging: Ct Abdomen Pelvis Wo Contrast  Result Date: 04/13/2017 CLINICAL DATA:  Evaluate for retroperitoneal hematoma status post arterial access. EXAM: CT ABDOMEN AND PELVIS WITHOUT CONTRAST TECHNIQUE: Multidetector CT imaging of the abdomen and pelvis was performed following the standard protocol without IV contrast. COMPARISON:  04/03/2017 FINDINGS: Lower chest: Moderate bilateral pleural effusions with overlying compressive type atelectasis. Hepatobiliary: No focal liver abnormality. Vicarious excretion of contrast material into the  gallbladder is noted. Pancreas: Unremarkable. No pancreatic ductal dilatation or surrounding inflammatory changes. Spleen: Normal in size without focal abnormality. Adrenals/Urinary Tract: Normal adrenal glands. There is a striated nephrographic appearance of both kidneys, right greater than left. IV contrast material is identified within a dilated left renal collecting system and ureter. Asymmetrically diminished contrast within the right renal collecting system identified. Urinary bladder collapsed around a Foley catheter. Stomach/Bowel: There is a gastrostomy tube with tip in the distal stomach. Moderate to increased attenuation material is identified within the gastric lumen, image 17 of series 3. There also high attenuation material is identified within the small and large bowel loops. Vascular/Lymphatic: Aortic atherosclerosis. No aneurysm. Filter identified within the IVC. There is no adenopathy. Reproductive: Status post hysterectomy. No adnexal masses. Other: There is a small volume of ascites identified. Small to moderate volume of ascites within the right upper quadrant of the abdomen and right lower quadrant of the abdomen and pelvis. There is diffuse body wall edema compatible with anasarca. No retroperitoneal hematoma identified. Musculoskeletal: No acute or significant osseous findings. IMPRESSION: 1. No evidence for retroperitoneal hematoma. 2. Striated nephrographic appearance of both kidneys identified, right greater than left. Imaging findings are favored to represent sequelae of acute tubular necrosis. Not excluded but favored less likely would be an embolic phenomenon resulting in areas of renal infarct. 3. Left-sided hydronephrosis and hydroureter. Etiology indeterminate. Cannot rule out distal ureteral obstruction. 4. Bilateral pleural effusions, ascites and diffuse body wall edema compatible with fluid overload. 5. Intermediate  to high attenuation material within the stomach. Although  conceivably this could be related to it administration of enteric contrast material ongoing bleeding within the gastric lumen is not excluded. 6.  Aortic Atherosclerosis (ICD10-I70.0). Electronically Signed   By: Kerby Moors M.D.   On: 04/13/2017 12:00   Ir Angiogram Visceral Selective  Result Date: 04/13/2017 INDICATION: 62 year old female with gastric ulcer and a visible bleeding vessel. Patient underwent embolization yesterday but continues to demonstrate evidence of upper GI bleeding. Repeat angiography is requested. EXAM: IR EMBO ART VEN HEMORR LYMPH EXTRAV INC GUIDE ROADMAPPING; ARTERIOGRAPHY; IR ULTRASOUND GUIDANCE VASC ACCESS RIGHT; SELECTIVE VISCERAL ARTERIOGRAPHY; ADDITIONAL ARTERIOGRAPHY; IR RIGHT FLOURO GUIDE CV LINE 1. Ultrasound-guided access right common femoral artery 2. Catheterization and angiogram of the left gastric artery 3. Catheterization and angiogram the celiac axis 4. Catheterization and angiogram of the splenic artery 5. Catheterization and angiogram of the superior division of the splenic artery 6. Catheterization and angiogram of the inferior division of the splenic artery 7. Coil embolization of the inferior division of the splenic artery MEDICATIONS: None ANESTHESIA/SEDATION: Moderate (conscious) sedation was employed during this procedure. A total of Versed 1 mg was administered intravenously. The patient was on a fentanyl drip. Moderate Sedation Time: 60 minutes. The patient's level of consciousness and vital signs were monitored continuously by radiology nursing throughout the procedure under my direct supervision. CONTRAST:  80 mL Isovue 300 FLUOROSCOPY TIME:  Fluoroscopy Time: 13 minutes 6 seconds (1299 mGy). COMPLICATIONS: None immediate. PROCEDURE: Informed consent was obtained from the patient following explanation of the procedure, risks, benefits and alternatives. The patient understands, agrees and consents for the procedure. All questions were addressed. A time out  was performed prior to the initiation of the procedure. Maximal barrier sterile technique utilized including caps, mask, sterile gowns, sterile gloves, large sterile drape, hand hygiene, and Betadine prep. The right common femoral artery was interrogated with ultrasound and found to be widely patent. An image was obtained and stored for the medical record. Local anesthesia was attained by infiltration with 1% lidocaine. A small dermatotomy was made. Under real-time sonographic guidance, the vessel was punctured with a 21 gauge micropuncture needle. Using standard technique, the initial micro needle was exchanged over a 0.018 micro wire for a transitional 4 [place] micro sheath. The micro sheath was then exchanged over a 0.035 wire for a 5 French vascular sheath. A C2 cobra catheter was advanced over a Bentson wire into the abdominal aorta. The catheter was used to select the left gastric artery which arises directly from the aorta. Catheter angiography was performed. Successful coil embolization of the left gastric artery on the prior angiogram. There is no significant perfusion in the region of the vascular clip. The C2 cobra catheter was then advanced into the celiac artery and a celiac arteriogram was performed. The vessels are significantly attenuated consistent with diffuse vaso spasm. Branches of the short gastric arteries are identified in the region of the previously placed endoscopic clip. Using a glidewire, the 5 [place] catheter was successfully advanced into the splenic artery. Angiography was then performed in multiple obliquities. The splenic artery divides into a superior and inferior division. Both divisions give rise to some a short gastric arteries. A Renegade high flow microcatheter was successfully advanced into the distal splenic artery and then into the superior division. Superior division short gastric arteries appear to provide blood flow more superiorly along the gastric fundus. The  microcatheter was then successfully navigated into the inferior division of the splenic  artery. The short gastric arteries are in close proximity to the endovascular clip. No definite active extravasation was identified. The decision was made to perform coil embolization. This will likely devascularize approximately 30-40% of the lower pole of the spleen. Coil embolization was performed using a combination of 2 and 3 mm interlock and vortex coils. Post embolization arteriography demonstrates complete cessation of flow in the arteries in the region of the previously placed endovascular clip. The catheters were removed. Hemostasis was attained with the assistance of an ExoSeal extra arterial vascular plug. IMPRESSION: 1. Successful prior coil embolization of the left gastric artery. 2. Coil embolization of the inferior division of the splenic artery and is associated short gastric arteries in the region of the previously placed endovascular clip. Angiography and embolization therapy has now been maximized. If the patient continues to hemorrhage, repeat endoscopy or surgery would likely be required. Signed, Criselda Peaches, MD [facility] Radiology Electronically Signed   By: Jacqulynn Cadet M.D.   On: 04/13/2017 19:09   Ir Angiogram Visceral Selective  Result Date: 04/12/2017 INDICATION: History of recent upper GI bleed, post endoscopy approximately 1 week ago resulting in placement of an endoscopy clip at the location of a bleeding gastric ulcer. Patient subsequently found to have a lower extremity DVT and was started on anti coagulation (patient has received 2 doses of Xarelto). Unfortunately, this resulted in a recurrent large upper GI bleed. Repeat upper endoscopy demonstrates a large amount of blood products within the stomach with inability to achieve hemostasis from endoscopic approach. As such, request made for emergent mesenteric arteriogram and  potential percutaneous coil embolization as well as placement of an IVC filter for temporary caval interruption purposes. EXAM: 1. ULTRASOUND GUIDANCE FOR VENOUS AND ARTERIAL ACCESS 2. SELECTIVE CELIAC ARTERIOGRAM. 3. SELECTIVE ACCESSORY LEFT GASTRIC ARTERIOGRAM. 4. SELECTIVE ARTERIOGRAM OF DISTAL TRIBUTARY OF THE LEFT GASTRIC ARTERY (3rd ORDER) AND PERCUTANEOUS COIL EMBOLIZATION 5. IVC CATHETERIZATION AND VENOGRAM 6. IVC FILTER INSERTION COMPARISON:  CTA of the abdomen pelvis - 04/03/2017; chest CT - 04/07/2017 MEDICATIONS: None. ANESTHESIA/SEDATION: Versed 58 mg IV Sedation Time: minutes; The patient was continuously monitored during the procedure by the interventional radiology nurse under my direct supervision. CONTRAST:  75 cc Isovue 300 FLUOROSCOPY TIME:  9 Minutes 24 seconds (353 mGy) COMPLICATIONS: None immediate. PROCEDURE: Informed written consent was obtained from the patient's brother following explanation of the procedure, risks, benefits and alternatives. A time out was performed prior to the initiation of the procedure. The right groin was prepped and draped in usual sterile fashion. Maximal barrier sterile technique utilized including caps, mask, sterile gowns, sterile gloves, large sterile drape, hand hygiene, and chlorhexidine prep. The right femoral head was marked fluoroscopically. Under ultrasound guidance, the right common femoral artery was accessed with a micropuncture kit after the overlying soft tissues were anesthetized with 1% lidocaine. An ultrasound image was saved for documentation purposes. The micropuncture sheath was exchanged for a 6 [place] vascular sheath over a Bentson wire. (Note, a 6 [place] vascular sheath was selected for access given suspected hemodynamically significant stenosis within the mid aspect of the abdominal aorta and potential necessity for angioplasty.) A closure arteriogram was performed through the side of the sheath confirming access within the right common  femoral artery, however the 6 [place] vascular sheath was noted to be near occlusive secondary to a combination of atherosclerotic plaque as well as the vasospasm. With the use of a Kumpe catheter, a Bentson wire was advanced to  the level of the caudal aspect of the thoracic aorta. Limited abdominal aortogram was performed demonstrating patency of the abdominal aorta with the 5 French catheter in place and as such abdominal aortic angioplasty would not be necessary and the decision was made to downsized the vascular sheath a 5 [place]. Again, closure arteriogram was performed demonstrating preferential flow through the right internal iliac artery with atretic flow surrounding the vascular sheath. Given patient's critical state the decision was made to proceed with the mesenteric arteriogram and embolization in an expeditious manner in lieu of abandoning the femoral access for a brachial approach. Over a Bentson wire, a Mickelson catheter was advanced to the level of the thoracic aorta where it was back bled and flushed. The catheter was then utilized to select the celiac artery and a selective celiac arteriogram was performed The Mickelson catheter was then utilized to select the left gastric artery and a selective left gastric arteriogram was performed. With the use of an 0.014 fathom microwire, a regular Renegade microcatheter was advanced into a distal tributary of the left gastric artery serving as the predominant arterial supply to the tangle of vessels regional to the endoscopy clip. Selective injection confirmed appropriate positioning. The distal aspect of the branch vessel was then percutaneously coil embolized with multiple overlapping 2 mm and 3 mm soft and regular interlock coils. The microcatheter was withdrawn into the central aspect of the left gastric artery and a post embolization arteriogram was performed. The microcatheter was removed and a completion left gastric arteriogram was performed via the  Mickelson catheter Again, the Mickelson catheter was utilized to select the celiac artery and selective celiac arteriograms were performed in various obliquities. Images were reviewed and the arteriogram portion of the procedure was terminated. Completion closure arteriogram was performed. All wires, catheters and sheaths were removed from the patient. Hemostasis was achieved at the right groin access site with deployment of a ExoSeal closure device and manual compression. _________________________________________________________ Next, attention was made towards placement of the IVC filter. Under sterile condition and local anesthesia, right common femoral venous access was performed with ultrasound. An ultrasound image was saved and sent to PACS. Over a guidewire, the IVC filter delivery sheath and inner dilator were advanced into the IVC just above the IVC bifurcation. Contrast injection was performed for an IVC venogram. Through the delivery sheath, a retrievable Denali IVC filter was deployed below the level of the renal veins and above the IVC bifurcation. Limited post deployment venacavagram was performed. The delivery sheath was removed and hemostasis was obtained with manual compression. A dressing was placed. The patient tolerated the procedure well without immediate post procedural complication. FINDINGS: Selective celiac arteriogram demonstrates non conventional branching pattern without supply to the left gastric artery or significant supply to the gastric fundus, as was demonstrated on preceding CTA. As such, the accessory left gastric artery arising directly from the abdominal aorta was selected with sub selective arteriogram demonstrating a tangle of blood vessels contributing arterial supply to the portion of the stomach containing the endoscopy clip. Caudal division of the accessory left gastric artery supplies the dominant arterial contribution to the portion of the stomach containing the endoscopy  clip and as such, was percutaneously coil embolized with multiple overlapping 2 and 3 mm interlock coils to near the vessel's origin. Completion arteriogram demonstrates a technically excellent result with no definitive persistent arterial supply regional to the endoscopy clip. Following the percutaneous coil embolization, several additional selective arteriograms were performed in various obliquities, again  confirming no definitive supply to the gastric fundus. As such, additional embolization was not performed. The IVC is patent. No evidence of thrombus, stenosis, or occlusion. No variant venous anatomy. Successful placement of the IVC filter below the level of the renal veins. IMPRESSION: 1. Technically successful percutaneous coil embolization of the caudal division of the accessory left gastric artery serving as the predominant arterial supply to the portion of the stomach containing the endoscopy clip. 2. Selective celiac arteriogram fails to delineate significant arterial supply to the gastric fundus and in particular, to the portion of stomach regional to the endoscopy clip. 3. Successful ultrasound and fluoroscopically guided placement of an infrarenal retrievable IVC filter. PLAN: - Patient is to lie flat for 4 hours. Continued aggressive resuscitation as per the critical care team. Note, as the patient has experienced an acute large bleeding episode, I would expect the patient's H and H has not yet reached a nadir but hopefully will stabilize in the next 24-48 hours. - The IVC filter is potentially retrievable. The patient will be assessed for filter retrieval by Interventional Radiology in approximately 8-12 weeks. Further recommendations regarding filter retrieval, continued surveillance or declaration of device permanence, will be made at that time. Electronically Signed   By: Sandi Mariscal M.D.   On: 04/12/2017 07:04   Ir Angiogram Selective Each Additional Vessel  Result Date: 04/14/2017 INDICATION:  62 year old female with gastric ulcer and a visible bleeding vessel. Patient underwent embolization yesterday but continues to demonstrate evidence of upper GI bleeding. Repeat angiography is requested. EXAM: IR EMBO ART VEN HEMORR LYMPH EXTRAV INC GUIDE ROADMAPPING; ARTERIOGRAPHY; IR ULTRASOUND GUIDANCE VASC ACCESS RIGHT; SELECTIVE VISCERAL ARTERIOGRAPHY; ADDITIONAL ARTERIOGRAPHY; IR RIGHT FLOURO GUIDE CV LINE 1. Ultrasound-guided access right common femoral artery 2. Catheterization and angiogram of the left gastric artery 3. Catheterization and angiogram the celiac axis 4. Catheterization and angiogram of the splenic artery 5. Catheterization and angiogram of the superior division of the splenic artery 6. Catheterization and angiogram of the inferior division of the splenic artery 7. Coil embolization of the inferior division of the splenic artery MEDICATIONS: None ANESTHESIA/SEDATION: Moderate (conscious) sedation was employed during this procedure. A total of Versed 1 mg was administered intravenously. The patient was on a fentanyl drip. Moderate Sedation Time: 60 minutes. The patient's level of consciousness and vital signs were monitored continuously by radiology nursing throughout the procedure under my direct supervision. CONTRAST:  80 mL Isovue 300 FLUOROSCOPY TIME:  Fluoroscopy Time: 13 minutes 6 seconds (1299 mGy). COMPLICATIONS: None immediate. PROCEDURE: Informed consent was obtained from the patient following explanation of the procedure, risks, benefits and alternatives. The patient understands, agrees and consents for the procedure. All questions were addressed. A time out was performed prior to the initiation of the procedure. Maximal barrier sterile technique utilized including caps, mask, sterile gowns, sterile gloves, large sterile drape, hand hygiene, and Betadine prep. The right common femoral artery was interrogated with ultrasound and found to be widely patent. An image was obtained and  stored for the medical record. Local anesthesia was attained by infiltration with 1% lidocaine. A small dermatotomy was made. Under real-time sonographic guidance, the vessel was punctured with a 21 gauge micropuncture needle. Using standard technique, the initial micro needle was exchanged over a 0.018 micro wire for a transitional 4 [place] micro sheath. The micro sheath was then exchanged over a 0.035 wire for a 5 French vascular sheath. A C2 cobra catheter was advanced over a Bentson wire into the abdominal aorta. The  catheter was used to select the left gastric artery which arises directly from the aorta. Catheter angiography was performed. Successful coil embolization of the left gastric artery on the prior angiogram. There is no significant perfusion in the region of the vascular clip. The C2 cobra catheter was then advanced into the celiac artery and a celiac arteriogram was performed. The vessels are significantly attenuated consistent with diffuse vaso spasm. Branches of the short gastric arteries are identified in the region of the previously placed endoscopic clip. Using a glidewire, the 5 [place] catheter was successfully advanced into the splenic artery. Angiography was then performed in multiple obliquities. The splenic artery divides into a superior and inferior division. Both divisions give rise to some a short gastric arteries. A Renegade high flow microcatheter was successfully advanced into the distal splenic artery and then into the superior division. Superior division short gastric arteries appear to provide blood flow more superiorly along the gastric fundus. The microcatheter was then successfully navigated into the inferior division of the splenic artery. The short gastric arteries are in close proximity to the endovascular clip. No definite active extravasation was identified. The decision was made to perform coil embolization. This will likely devascularize approximately 30-40% of the lower  pole of the spleen. Coil embolization was performed using a combination of 2 and 3 mm interlock and vortex coils. Post embolization arteriography demonstrates complete cessation of flow in the arteries in the region of the previously placed endovascular clip. The catheters were removed. Hemostasis was attained with the assistance of an ExoSeal extra arterial vascular plug. IMPRESSION: 1. Successful prior coil embolization of the left gastric artery. 2. Coil embolization of the inferior division of the splenic artery and is associated short gastric arteries in the region of the previously placed endovascular clip. Angiography and embolization therapy has now been maximized. If the patient continues to hemorrhage, repeat endoscopy or surgery would likely be required. Signed, Criselda Peaches, MD [facility] Radiology Electronically Signed   By: Jacqulynn Cadet M.D.   On: 04/13/2017 19:09   Ir Angiogram Selective Each Additional Vessel  Result Date: 04/13/2017 INDICATION: 62 year old female with gastric ulcer and a visible bleeding vessel. Patient underwent embolization yesterday but continues to demonstrate evidence of upper GI bleeding. Repeat angiography is requested. EXAM: IR EMBO ART VEN HEMORR LYMPH EXTRAV INC GUIDE ROADMAPPING; ARTERIOGRAPHY; IR ULTRASOUND GUIDANCE VASC ACCESS RIGHT; SELECTIVE VISCERAL ARTERIOGRAPHY; ADDITIONAL ARTERIOGRAPHY; IR RIGHT FLOURO GUIDE CV LINE 1. Ultrasound-guided access right common femoral artery 2. Catheterization and angiogram of the left gastric artery 3. Catheterization and angiogram the celiac axis 4. Catheterization and angiogram of the splenic artery 5. Catheterization and angiogram of the superior division of the splenic artery 6. Catheterization and angiogram of the inferior division of the splenic artery 7. Coil embolization of the inferior division of the splenic artery MEDICATIONS: None ANESTHESIA/SEDATION:  Moderate (conscious) sedation was employed during this procedure. A total of Versed 1 mg was administered intravenously. The patient was on a fentanyl drip. Moderate Sedation Time: 60 minutes. The patient's level of consciousness and vital signs were monitored continuously by radiology nursing throughout the procedure under my direct supervision. CONTRAST:  80 mL Isovue 300 FLUOROSCOPY TIME:  Fluoroscopy Time: 13 minutes 6 seconds (1299 mGy). COMPLICATIONS: None immediate. PROCEDURE: Informed consent was obtained from the patient following explanation of the procedure, risks, benefits and alternatives. The patient understands, agrees and consents for the procedure. All questions were addressed. A time out was performed prior  to the initiation of the procedure. Maximal barrier sterile technique utilized including caps, mask, sterile gowns, sterile gloves, large sterile drape, hand hygiene, and Betadine prep. The right common femoral artery was interrogated with ultrasound and found to be widely patent. An image was obtained and stored for the medical record. Local anesthesia was attained by infiltration with 1% lidocaine. A small dermatotomy was made. Under real-time sonographic guidance, the vessel was punctured with a 21 gauge micropuncture needle. Using standard technique, the initial micro needle was exchanged over a 0.018 micro wire for a transitional 4 [place] micro sheath. The micro sheath was then exchanged over a 0.035 wire for a 5 French vascular sheath. A C2 cobra catheter was advanced over a Bentson wire into the abdominal aorta. The catheter was used to select the left gastric artery which arises directly from the aorta. Catheter angiography was performed. Successful coil embolization of the left gastric artery on the prior angiogram. There is no significant perfusion in the region of the vascular clip. The C2 cobra catheter was then advanced into the celiac artery and a celiac arteriogram was performed.  The vessels are significantly attenuated consistent with diffuse vaso spasm. Branches of the short gastric arteries are identified in the region of the previously placed endoscopic clip. Using a glidewire, the 5 [place] catheter was successfully advanced into the splenic artery. Angiography was then performed in multiple obliquities. The splenic artery divides into a superior and inferior division. Both divisions give rise to some a short gastric arteries. A Renegade high flow microcatheter was successfully advanced into the distal splenic artery and then into the superior division. Superior division short gastric arteries appear to provide blood flow more superiorly along the gastric fundus. The microcatheter was then successfully navigated into the inferior division of the splenic artery. The short gastric arteries are in close proximity to the endovascular clip. No definite active extravasation was identified. The decision was made to perform coil embolization. This will likely devascularize approximately 30-40% of the lower pole of the spleen. Coil embolization was performed using a combination of 2 and 3 mm interlock and vortex coils. Post embolization arteriography demonstrates complete cessation of flow in the arteries in the region of the previously placed endovascular clip. The catheters were removed. Hemostasis was attained with the assistance of an ExoSeal extra arterial vascular plug. IMPRESSION: 1. Successful prior coil embolization of the left gastric artery. 2. Coil embolization of the inferior division of the splenic artery and is associated short gastric arteries in the region of the previously placed endovascular clip. Angiography and embolization therapy has now been maximized. If the patient continues to hemorrhage, repeat endoscopy or surgery would likely be required. Signed, Criselda Peaches, MD [facility] Radiology Electronically Signed   By:  Jacqulynn Cadet M.D.   On: 04/13/2017 19:09   Ir Angiogram Selective Each Additional Vessel  Result Date: 04/13/2017 INDICATION: 62 year old female with gastric ulcer and a visible bleeding vessel. Patient underwent embolization yesterday but continues to demonstrate evidence of upper GI bleeding. Repeat angiography is requested. EXAM: IR EMBO ART VEN HEMORR LYMPH EXTRAV INC GUIDE ROADMAPPING; ARTERIOGRAPHY; IR ULTRASOUND GUIDANCE VASC ACCESS RIGHT; SELECTIVE VISCERAL ARTERIOGRAPHY; ADDITIONAL ARTERIOGRAPHY; IR RIGHT FLOURO GUIDE CV LINE 1. Ultrasound-guided access right common femoral artery 2. Catheterization and angiogram of the left gastric artery 3. Catheterization and angiogram the celiac axis 4. Catheterization and angiogram of the splenic artery 5. Catheterization and angiogram of the superior division of the splenic artery 6.  Catheterization and angiogram of the inferior division of the splenic artery 7. Coil embolization of the inferior division of the splenic artery MEDICATIONS: None ANESTHESIA/SEDATION: Moderate (conscious) sedation was employed during this procedure. A total of Versed 1 mg was administered intravenously. The patient was on a fentanyl drip. Moderate Sedation Time: 60 minutes. The patient's level of consciousness and vital signs were monitored continuously by radiology nursing throughout the procedure under my direct supervision. CONTRAST:  80 mL Isovue 300 FLUOROSCOPY TIME:  Fluoroscopy Time: 13 minutes 6 seconds (1299 mGy). COMPLICATIONS: None immediate. PROCEDURE: Informed consent was obtained from the patient following explanation of the procedure, risks, benefits and alternatives. The patient understands, agrees and consents for the procedure. All questions were addressed. A time out was performed prior to the initiation of the procedure. Maximal barrier sterile technique utilized including caps, mask, sterile gowns, sterile gloves, large sterile drape, hand hygiene, and  Betadine prep. The right common femoral artery was interrogated with ultrasound and found to be widely patent. An image was obtained and stored for the medical record. Local anesthesia was attained by infiltration with 1% lidocaine. A small dermatotomy was made. Under real-time sonographic guidance, the vessel was punctured with a 21 gauge micropuncture needle. Using standard technique, the initial micro needle was exchanged over a 0.018 micro wire for a transitional 4 [place] micro sheath. The micro sheath was then exchanged over a 0.035 wire for a 5 French vascular sheath. A C2 cobra catheter was advanced over a Bentson wire into the abdominal aorta. The catheter was used to select the left gastric artery which arises directly from the aorta. Catheter angiography was performed. Successful coil embolization of the left gastric artery on the prior angiogram. There is no significant perfusion in the region of the vascular clip. The C2 cobra catheter was then advanced into the celiac artery and a celiac arteriogram was performed. The vessels are significantly attenuated consistent with diffuse vaso spasm. Branches of the short gastric arteries are identified in the region of the previously placed endoscopic clip. Using a glidewire, the 5 [place] catheter was successfully advanced into the splenic artery. Angiography was then performed in multiple obliquities. The splenic artery divides into a superior and inferior division. Both divisions give rise to some a short gastric arteries. A Renegade high flow microcatheter was successfully advanced into the distal splenic artery and then into the superior division. Superior division short gastric arteries appear to provide blood flow more superiorly along the gastric fundus. The microcatheter was then successfully navigated into the inferior division of the splenic artery. The short gastric arteries are in close proximity to the endovascular clip. No definite active  extravasation was identified. The decision was made to perform coil embolization. This will likely devascularize approximately 30-40% of the lower pole of the spleen. Coil embolization was performed using a combination of 2 and 3 mm interlock and vortex coils. Post embolization arteriography demonstrates complete cessation of flow in the arteries in the region of the previously placed endovascular clip. The catheters were removed. Hemostasis was attained with the assistance of an ExoSeal extra arterial vascular plug. IMPRESSION: 1. Successful prior coil embolization of the left gastric artery. 2. Coil embolization of the inferior division of the splenic artery and is associated short gastric arteries in the region of the previously placed endovascular clip. Angiography and embolization therapy has now been maximized. If the patient continues to hemorrhage, repeat endoscopy or surgery would likely be required. Signed, Criselda Peaches, MD [facility] Radiology Electronically Signed   By: Jacqulynn Cadet M.D.   On: 04/13/2017 19:09   Ir Angiogram Selective Each Additional Vessel  Result Date: 04/12/2017 INDICATION: History of recent upper GI bleed, post endoscopy approximately 1 week ago resulting in placement of an endoscopy clip at the location of a bleeding gastric ulcer. Patient subsequently found to have a lower extremity DVT and was started on anti coagulation (patient has received 2 doses of Xarelto). Unfortunately, this resulted in a recurrent large upper GI bleed. Repeat upper endoscopy demonstrates a large amount of blood products within the stomach with inability to achieve hemostasis from endoscopic approach. As such, request made for emergent mesenteric arteriogram and potential percutaneous coil embolization as well as placement of an IVC filter for temporary caval interruption purposes. EXAM: 1. ULTRASOUND GUIDANCE FOR VENOUS AND ARTERIAL ACCESS  2. SELECTIVE CELIAC ARTERIOGRAM. 3. SELECTIVE ACCESSORY LEFT GASTRIC ARTERIOGRAM. 4. SELECTIVE ARTERIOGRAM OF DISTAL TRIBUTARY OF THE LEFT GASTRIC ARTERY (3rd ORDER) AND PERCUTANEOUS COIL EMBOLIZATION 5. IVC CATHETERIZATION AND VENOGRAM 6. IVC FILTER INSERTION COMPARISON:  CTA of the abdomen pelvis - 04/03/2017; chest CT - 04/07/2017 MEDICATIONS: None. ANESTHESIA/SEDATION: Versed 58 mg IV Sedation Time: minutes; The patient was continuously monitored during the procedure by the interventional radiology nurse under my direct supervision. CONTRAST:  75 cc Isovue 300 FLUOROSCOPY TIME:  9 Minutes 24 seconds (841 mGy) COMPLICATIONS: None immediate. PROCEDURE: Informed written consent was obtained from the patient's brother following explanation of the procedure, risks, benefits and alternatives. A time out was performed prior to the initiation of the procedure. The right groin was prepped and draped in usual sterile fashion. Maximal barrier sterile technique utilized including caps, mask, sterile gowns, sterile gloves, large sterile drape, hand hygiene, and chlorhexidine prep. The right femoral head was marked fluoroscopically. Under ultrasound guidance, the right common femoral artery was accessed with a micropuncture kit after the overlying soft tissues were anesthetized with 1% lidocaine. An ultrasound image was saved for documentation purposes. The micropuncture sheath was exchanged for a 6 [place] vascular sheath over a Bentson wire. (Note, a 6 [place] vascular sheath was selected for access given suspected hemodynamically significant stenosis within the mid aspect of the abdominal aorta and potential necessity for angioplasty.) A closure arteriogram was performed through the side of the sheath confirming access within the right common femoral artery, however the 6 [place] vascular sheath was noted to be near occlusive secondary to a combination of atherosclerotic plaque as well as the vasospasm. With the use of a  Kumpe catheter, a Bentson wire was advanced to the level of the caudal aspect of the thoracic aorta. Limited abdominal aortogram was performed demonstrating patency of the abdominal aorta with the 5 French catheter in place and as such abdominal aortic angioplasty would not be necessary and the decision was made to downsized the vascular sheath a 5 [place]. Again, closure arteriogram was performed demonstrating preferential flow through the right internal iliac artery with atretic flow surrounding the vascular sheath. Given patient's critical state the decision was made to proceed with the mesenteric arteriogram and embolization in an expeditious manner in lieu of abandoning the femoral access for a brachial approach. Over a Bentson wire, a Mickelson catheter was advanced to the level of the thoracic aorta where it was back bled and flushed. The catheter was then utilized to select the celiac artery and a selective celiac arteriogram was performed The Mickelson catheter was then utilized to select the left gastric artery and a selective  left gastric arteriogram was performed. With the use of an 0.014 fathom microwire, a regular Renegade microcatheter was advanced into a distal tributary of the left gastric artery serving as the predominant arterial supply to the tangle of vessels regional to the endoscopy clip. Selective injection confirmed appropriate positioning. The distal aspect of the branch vessel was then percutaneously coil embolized with multiple overlapping 2 mm and 3 mm soft and regular interlock coils. The microcatheter was withdrawn into the central aspect of the left gastric artery and a post embolization arteriogram was performed. The microcatheter was removed and a completion left gastric arteriogram was performed via the Mickelson catheter Again, the Mickelson catheter was utilized to select the celiac artery and selective celiac arteriograms were performed in various obliquities. Images were  reviewed and the arteriogram portion of the procedure was terminated. Completion closure arteriogram was performed. All wires, catheters and sheaths were removed from the patient. Hemostasis was achieved at the right groin access site with deployment of a ExoSeal closure device and manual compression. _________________________________________________________ Next, attention was made towards placement of the IVC filter. Under sterile condition and local anesthesia, right common femoral venous access was performed with ultrasound. An ultrasound image was saved and sent to PACS. Over a guidewire, the IVC filter delivery sheath and inner dilator were advanced into the IVC just above the IVC bifurcation. Contrast injection was performed for an IVC venogram. Through the delivery sheath, a retrievable Denali IVC filter was deployed below the level of the renal veins and above the IVC bifurcation. Limited post deployment venacavagram was performed. The delivery sheath was removed and hemostasis was obtained with manual compression. A dressing was placed. The patient tolerated the procedure well without immediate post procedural complication. FINDINGS: Selective celiac arteriogram demonstrates non conventional branching pattern without supply to the left gastric artery or significant supply to the gastric fundus, as was demonstrated on preceding CTA. As such, the accessory left gastric artery arising directly from the abdominal aorta was selected with sub selective arteriogram demonstrating a tangle of blood vessels contributing arterial supply to the portion of the stomach containing the endoscopy clip. Caudal division of the accessory left gastric artery supplies the dominant arterial contribution to the portion of the stomach containing the endoscopy clip and as such, was percutaneously coil embolized with multiple overlapping 2 and 3 mm interlock coils to near the vessel's origin. Completion arteriogram demonstrates a  technically excellent result with no definitive persistent arterial supply regional to the endoscopy clip. Following the percutaneous coil embolization, several additional selective arteriograms were performed in various obliquities, again confirming no definitive supply to the gastric fundus. As such, additional embolization was not performed. The IVC is patent. No evidence of thrombus, stenosis, or occlusion. No variant venous anatomy. Successful placement of the IVC filter below the level of the renal veins. IMPRESSION: 1. Technically successful percutaneous coil embolization of the caudal division of the accessory left gastric artery serving as the predominant arterial supply to the portion of the stomach containing the endoscopy clip. 2. Selective celiac arteriogram fails to delineate significant arterial supply to the gastric fundus and in particular, to the portion of stomach regional to the endoscopy clip. 3. Successful ultrasound and fluoroscopically guided placement of an infrarenal retrievable IVC filter. PLAN: - Patient is to lie flat for 4 hours. Continued aggressive resuscitation as per the critical care team. Note, as the patient has experienced an acute large bleeding episode, I would expect the patient's H and H has not yet reached  a nadir but hopefully will stabilize in the next 24-48 hours. - The IVC filter is potentially retrievable. The patient will be assessed for filter retrieval by Interventional Radiology in approximately 8-12 weeks. Further recommendations regarding filter retrieval, continued surveillance or declaration of device permanence, will be made at that time. Electronically Signed   By: Sandi Mariscal M.D.   On: 04/12/2017 07:04   Ir Ivc Filter Plmt / S&i /img Guid/mod Sed  Result Date: 04/12/2017 INDICATION: History of recent upper GI bleed, post endoscopy approximately 1 week ago resulting in placement of an endoscopy clip at the location of a bleeding gastric ulcer. Patient  subsequently found to have a lower extremity DVT and was started on anti coagulation (patient has received 2 doses of Xarelto). Unfortunately, this resulted in a recurrent large upper GI bleed. Repeat upper endoscopy demonstrates a large amount of blood products within the stomach with inability to achieve hemostasis from endoscopic approach. As such, request made for emergent mesenteric arteriogram and potential percutaneous coil embolization as well as placement of an IVC filter for temporary caval interruption purposes. EXAM: 1. ULTRASOUND GUIDANCE FOR VENOUS AND ARTERIAL ACCESS 2. SELECTIVE CELIAC ARTERIOGRAM. 3. SELECTIVE ACCESSORY LEFT GASTRIC ARTERIOGRAM. 4. SELECTIVE ARTERIOGRAM OF DISTAL TRIBUTARY OF THE LEFT GASTRIC ARTERY (3rd ORDER) AND PERCUTANEOUS COIL EMBOLIZATION 5. IVC CATHETERIZATION AND VENOGRAM 6. IVC FILTER INSERTION COMPARISON:  CTA of the abdomen pelvis - 04/03/2017; chest CT - 04/07/2017 MEDICATIONS: None. ANESTHESIA/SEDATION: Versed 58 mg IV Sedation Time: minutes; The patient was continuously monitored during the procedure by the interventional radiology nurse under my direct supervision. CONTRAST:  75 cc Isovue 300 FLUOROSCOPY TIME:  9 Minutes 24 seconds (254 mGy) COMPLICATIONS: None immediate. PROCEDURE: Informed written consent was obtained from the patient's brother following explanation of the procedure, risks, benefits and alternatives. A time out was performed prior to the initiation of the procedure. The right groin was prepped and draped in usual sterile fashion. Maximal barrier sterile technique utilized including caps, mask, sterile gowns, sterile gloves, large sterile drape, hand hygiene, and chlorhexidine prep. The right femoral head was marked fluoroscopically. Under ultrasound guidance, the right common femoral artery was accessed with a micropuncture kit after the overlying soft tissues were anesthetized with 1% lidocaine. An ultrasound image was saved for documentation  purposes. The micropuncture sheath was exchanged for a 6 [place] vascular sheath over a Bentson wire. (Note, a 6 [place] vascular sheath was selected for access given suspected hemodynamically significant stenosis within the mid aspect of the abdominal aorta and potential necessity for angioplasty.) A closure arteriogram was performed through the side of the sheath confirming access within the right common femoral artery, however the 6 [place] vascular sheath was noted to be near occlusive secondary to a combination of atherosclerotic plaque as well as the vasospasm. With the use of a Kumpe catheter, a Bentson wire was advanced to the level of the caudal aspect of the thoracic aorta. Limited abdominal aortogram was performed demonstrating patency of the abdominal aorta with the 5 French catheter in place and as such abdominal aortic angioplasty would not be necessary and the decision was made to downsized the vascular sheath a 5 [place]. Again, closure arteriogram was performed demonstrating preferential flow through the right internal iliac artery with atretic flow surrounding the vascular sheath. Given patient's critical state the decision was made to proceed with the mesenteric arteriogram and embolization in an expeditious manner in lieu of abandoning the femoral access for a brachial approach. Over a Bentson wire, a  Mickelson catheter was advanced to the level of the thoracic aorta where it was back bled and flushed. The catheter was then utilized to select the celiac artery and a selective celiac arteriogram was performed The Mickelson catheter was then utilized to select the left gastric artery and a selective left gastric arteriogram was performed. With the use of an 0.014 fathom microwire, a regular Renegade microcatheter was advanced into a distal tributary of the left gastric artery serving as the predominant arterial supply to the tangle of vessels regional to the endoscopy clip. Selective injection  confirmed appropriate positioning. The distal aspect of the branch vessel was then percutaneously coil embolized with multiple overlapping 2 mm and 3 mm soft and regular interlock coils. The microcatheter was withdrawn into the central aspect of the left gastric artery and a post embolization arteriogram was performed. The microcatheter was removed and a completion left gastric arteriogram was performed via the Mickelson catheter Again, the Mickelson catheter was utilized to select the celiac artery and selective celiac arteriograms were performed in various obliquities. Images were reviewed and the arteriogram portion of the procedure was terminated. Completion closure arteriogram was performed. All wires, catheters and sheaths were removed from the patient. Hemostasis was achieved at the right groin access site with deployment of a ExoSeal closure device and manual compression. _________________________________________________________ Next, attention was made towards placement of the IVC filter. Under sterile condition and local anesthesia, right common femoral venous access was performed with ultrasound. An ultrasound image was saved and sent to PACS. Over a guidewire, the IVC filter delivery sheath and inner dilator were advanced into the IVC just above the IVC bifurcation. Contrast injection was performed for an IVC venogram. Through the delivery sheath, a retrievable Denali IVC filter was deployed below the level of the renal veins and above the IVC bifurcation. Limited post deployment venacavagram was performed. The delivery sheath was removed and hemostasis was obtained with manual compression. A dressing was placed. The patient tolerated the procedure well without immediate post procedural complication. FINDINGS: Selective celiac arteriogram demonstrates non conventional branching pattern without supply to the left gastric artery or significant supply to the gastric fundus, as was demonstrated on preceding  CTA. As such, the accessory left gastric artery arising directly from the abdominal aorta was selected with sub selective arteriogram demonstrating a tangle of blood vessels contributing arterial supply to the portion of the stomach containing the endoscopy clip. Caudal division of the accessory left gastric artery supplies the dominant arterial contribution to the portion of the stomach containing the endoscopy clip and as such, was percutaneously coil embolized with multiple overlapping 2 and 3 mm interlock coils to near the vessel's origin. Completion arteriogram demonstrates a technically excellent result with no definitive persistent arterial supply regional to the endoscopy clip. Following the percutaneous coil embolization, several additional selective arteriograms were performed in various obliquities, again confirming no definitive supply to the gastric fundus. As such, additional embolization was not performed. The IVC is patent. No evidence of thrombus, stenosis, or occlusion. No variant venous anatomy. Successful placement of the IVC filter below the level of the renal veins. IMPRESSION: 1. Technically successful percutaneous coil embolization of the caudal division of the accessory left gastric artery serving as the predominant arterial supply to the portion of the stomach containing the endoscopy clip. 2. Selective celiac arteriogram fails to delineate significant arterial supply to the gastric fundus and in particular, to the portion of stomach regional to the endoscopy clip. 3. Successful ultrasound and fluoroscopically  guided placement of an infrarenal retrievable IVC filter. PLAN: - Patient is to lie flat for 4 hours. Continued aggressive resuscitation as per the critical care team. Note, as the patient has experienced an acute large bleeding episode, I would expect the patient's H and H has not yet reached a nadir but hopefully will stabilize in the next 24-48 hours. - The IVC filter is  potentially retrievable. The patient will be assessed for filter retrieval by Interventional Radiology in approximately 8-12 weeks. Further recommendations regarding filter retrieval, continued surveillance or declaration of device permanence, will be made at that time. Electronically Signed   By: Sandi Mariscal M.D.   On: 04/12/2017 07:04   Ir Fluoro Guide Cv Line Right  Result Date: 04/13/2017 INDICATION: 62 year old female with gastric ulcer and a visible bleeding vessel. Patient underwent embolization yesterday but continues to demonstrate evidence of upper GI bleeding. Repeat angiography is requested. EXAM: IR EMBO ART VEN HEMORR LYMPH EXTRAV INC GUIDE ROADMAPPING; ARTERIOGRAPHY; IR ULTRASOUND GUIDANCE VASC ACCESS RIGHT; SELECTIVE VISCERAL ARTERIOGRAPHY; ADDITIONAL ARTERIOGRAPHY; IR RIGHT FLOURO GUIDE CV LINE 1. Ultrasound-guided access right common femoral artery 2. Catheterization and angiogram of the left gastric artery 3. Catheterization and angiogram the celiac axis 4. Catheterization and angiogram of the splenic artery 5. Catheterization and angiogram of the superior division of the splenic artery 6. Catheterization and angiogram of the inferior division of the splenic artery 7. Coil embolization of the inferior division of the splenic artery MEDICATIONS: None ANESTHESIA/SEDATION: Moderate (conscious) sedation was employed during this procedure. A total of Versed 1 mg was administered intravenously. The patient was on a fentanyl drip. Moderate Sedation Time: 60 minutes. The patient's level of consciousness and vital signs were monitored continuously by radiology nursing throughout the procedure under my direct supervision. CONTRAST:  80 mL Isovue 300 FLUOROSCOPY TIME:  Fluoroscopy Time: 13 minutes 6 seconds (1299 mGy). COMPLICATIONS: None immediate. PROCEDURE: Informed consent was obtained from the patient following explanation of the procedure, risks, benefits and alternatives. The patient understands,  agrees and consents for the procedure. All questions were addressed. A time out was performed prior to the initiation of the procedure. Maximal barrier sterile technique utilized including caps, mask, sterile gowns, sterile gloves, large sterile drape, hand hygiene, and Betadine prep. The right common femoral artery was interrogated with ultrasound and found to be widely patent. An image was obtained and stored for the medical record. Local anesthesia was attained by infiltration with 1% lidocaine. A small dermatotomy was made. Under real-time sonographic guidance, the vessel was punctured with a 21 gauge micropuncture needle. Using standard technique, the initial micro needle was exchanged over a 0.018 micro wire for a transitional 4 [place] micro sheath. The micro sheath was then exchanged over a 0.035 wire for a 5 French vascular sheath. A C2 cobra catheter was advanced over a Bentson wire into the abdominal aorta. The catheter was used to select the left gastric artery which arises directly from the aorta. Catheter angiography was performed. Successful coil embolization of the left gastric artery on the prior angiogram. There is no significant perfusion in the region of the vascular clip. The C2 cobra catheter was then advanced into the celiac artery and a celiac arteriogram was performed. The vessels are significantly attenuated consistent with diffuse vaso spasm. Branches of the short gastric arteries are identified in the region of the previously placed endoscopic clip. Using a glidewire, the 5 [place] catheter was successfully advanced into the splenic artery. Angiography was then performed in multiple obliquities.  The splenic artery divides into a superior and inferior division. Both divisions give rise to some a short gastric arteries. A Renegade high flow microcatheter was successfully advanced into the distal splenic artery and then into the superior division. Superior division short gastric arteries  appear to provide blood flow more superiorly along the gastric fundus. The microcatheter was then successfully navigated into the inferior division of the splenic artery. The short gastric arteries are in close proximity to the endovascular clip. No definite active extravasation was identified. The decision was made to perform coil embolization. This will likely devascularize approximately 30-40% of the lower pole of the spleen. Coil embolization was performed using a combination of 2 and 3 mm interlock and vortex coils. Post embolization arteriography demonstrates complete cessation of flow in the arteries in the region of the previously placed endovascular clip. The catheters were removed. Hemostasis was attained with the assistance of an ExoSeal extra arterial vascular plug. IMPRESSION: 1. Successful prior coil embolization of the left gastric artery. 2. Coil embolization of the inferior division of the splenic artery and is associated short gastric arteries in the region of the previously placed endovascular clip. Angiography and embolization therapy has now been maximized. If the patient continues to hemorrhage, repeat endoscopy or surgery would likely be required. Signed, Criselda Peaches, MD [facility] Radiology Electronically Signed   By: Jacqulynn Cadet M.D.   On: 04/13/2017 19:09   Ir US Guide Vasc Access Right  Result Date: 04/13/2017 INDICATION: 62 year old female with gastric ulcer and a visible bleeding vessel. Patient underwent embolization yesterday but continues to demonstrate evidence of upper GI bleeding. Repeat angiography is requested. EXAM: IR EMBO ART VEN HEMORR LYMPH EXTRAV INC GUIDE ROADMAPPING; ARTERIOGRAPHY; IR ULTRASOUND GUIDANCE VASC ACCESS RIGHT; SELECTIVE VISCERAL ARTERIOGRAPHY; ADDITIONAL ARTERIOGRAPHY; IR RIGHT FLOURO GUIDE CV LINE 1. Ultrasound-guided access right common femoral artery 2. Catheterization and angiogram of the  left gastric artery 3. Catheterization and angiogram the celiac axis 4. Catheterization and angiogram of the splenic artery 5. Catheterization and angiogram of the superior division of the splenic artery 6. Catheterization and angiogram of the inferior division of the splenic artery 7. Coil embolization of the inferior division of the splenic artery MEDICATIONS: None ANESTHESIA/SEDATION: Moderate (conscious) sedation was employed during this procedure. A total of Versed 1 mg was administered intravenously. The patient was on a fentanyl drip. Moderate Sedation Time: 60 minutes. The patient's level of consciousness and vital signs were monitored continuously by radiology nursing throughout the procedure under my direct supervision. CONTRAST:  80 mL Isovue 300 FLUOROSCOPY TIME:  Fluoroscopy Time: 13 minutes 6 seconds (1299 mGy). COMPLICATIONS: None immediate. PROCEDURE: Informed consent was obtained from the patient following explanation of the procedure, risks, benefits and alternatives. The patient understands, agrees and consents for the procedure. All questions were addressed. A time out was performed prior to the initiation of the procedure. Maximal barrier sterile technique utilized including caps, mask, sterile gowns, sterile gloves, large sterile drape, hand hygiene, and Betadine prep. The right common femoral artery was interrogated with ultrasound and found to be widely patent. An image was obtained and stored for the medical record. Local anesthesia was attained by infiltration with 1% lidocaine. A small dermatotomy was made. Under real-time sonographic guidance, the vessel was punctured with a 21 gauge micropuncture needle. Using standard technique, the initial micro needle was exchanged over a 0.018 micro wire for a transitional 4 [place] micro sheath. The micro sheath was then exchanged over a  0.035 wire for a 5 [place] vascular sheath. A C2 cobra catheter was advanced over a Bentson wire into the abdominal  aorta. The catheter was used to select the left gastric artery which arises directly from the aorta. Catheter angiography was performed. Successful coil embolization of the left gastric artery on the prior angiogram. There is no significant perfusion in the region of the vascular clip. The C2 cobra catheter was then advanced into the celiac artery and a celiac arteriogram was performed. The vessels are significantly attenuated consistent with diffuse vaso spasm. Branches of the short gastric arteries are identified in the region of the previously placed endoscopic clip. Using a glidewire, the 5 [place] catheter was successfully advanced into the splenic artery. Angiography was then performed in multiple obliquities. The splenic artery divides into a superior and inferior division. Both divisions give rise to some a short gastric arteries. A Renegade high flow microcatheter was successfully advanced into the distal splenic artery and then into the superior division. Superior division short gastric arteries appear to provide blood flow more superiorly along the gastric fundus. The microcatheter was then successfully navigated into the inferior division of the splenic artery. The short gastric arteries are in close proximity to the endovascular clip. No definite active extravasation was identified. The decision was made to perform coil embolization. This will likely devascularize approximately 30-40% of the lower pole of the spleen. Coil embolization was performed using a combination of 2 and 3 mm interlock and vortex coils. Post embolization arteriography demonstrates complete cessation of flow in the arteries in the region of the previously placed endovascular clip. The catheters were removed. Hemostasis was attained with the assistance of an ExoSeal extra arterial vascular plug. IMPRESSION: 1. Successful prior coil embolization of the left gastric artery. 2. Coil embolization of the inferior division of the splenic  artery and is associated short gastric arteries in the region of the previously placed endovascular clip. Angiography and embolization therapy has now been maximized. If the patient continues to hemorrhage, repeat endoscopy or surgery would likely be required. Signed, Criselda Peaches, MD [facility] Radiology Electronically Signed   By: Jacqulynn Cadet M.D.   On: 04/13/2017 19:09   Ir US Guide Vasc Access Right  Result Date: 04/13/2017 INDICATION: 62 year old female with gastric ulcer and a visible bleeding vessel. Patient underwent embolization yesterday but continues to demonstrate evidence of upper GI bleeding. Repeat angiography is requested. EXAM: IR EMBO ART VEN HEMORR LYMPH EXTRAV INC GUIDE ROADMAPPING; ARTERIOGRAPHY; IR ULTRASOUND GUIDANCE VASC ACCESS RIGHT; SELECTIVE VISCERAL ARTERIOGRAPHY; ADDITIONAL ARTERIOGRAPHY; IR RIGHT FLOURO GUIDE CV LINE 1. Ultrasound-guided access right common femoral artery 2. Catheterization and angiogram of the left gastric artery 3. Catheterization and angiogram the celiac axis 4. Catheterization and angiogram of the splenic artery 5. Catheterization and angiogram of the superior division of the splenic artery 6. Catheterization and angiogram of the inferior division of the splenic artery 7. Coil embolization of the inferior division of the splenic artery MEDICATIONS: None ANESTHESIA/SEDATION: Moderate (conscious) sedation was employed during this procedure. A total of Versed 1 mg was administered intravenously. The patient was on a fentanyl drip. Moderate Sedation Time: 60 minutes. The patient's level of consciousness and vital signs were monitored continuously by radiology nursing throughout the procedure under my direct supervision. CONTRAST:  80 mL Isovue 300 FLUOROSCOPY TIME:  Fluoroscopy Time: 13 minutes 6 seconds (1299 mGy). COMPLICATIONS: None immediate. PROCEDURE: Informed consent was obtained from the patient  following explanation of the  procedure, risks, benefits and alternatives. The patient understands, agrees and consents for the procedure. All questions were addressed. A time out was performed prior to the initiation of the procedure. Maximal barrier sterile technique utilized including caps, mask, sterile gowns, sterile gloves, large sterile drape, hand hygiene, and Betadine prep. The right common femoral artery was interrogated with ultrasound and found to be widely patent. An image was obtained and stored for the medical record. Local anesthesia was attained by infiltration with 1% lidocaine. A small dermatotomy was made. Under real-time sonographic guidance, the vessel was punctured with a 21 gauge micropuncture needle. Using standard technique, the initial micro needle was exchanged over a 0.018 micro wire for a transitional 4 [place] micro sheath. The micro sheath was then exchanged over a 0.035 wire for a 5 French vascular sheath. A C2 cobra catheter was advanced over a Bentson wire into the abdominal aorta. The catheter was used to select the left gastric artery which arises directly from the aorta. Catheter angiography was performed. Successful coil embolization of the left gastric artery on the prior angiogram. There is no significant perfusion in the region of the vascular clip. The C2 cobra catheter was then advanced into the celiac artery and a celiac arteriogram was performed. The vessels are significantly attenuated consistent with diffuse vaso spasm. Branches of the short gastric arteries are identified in the region of the previously placed endoscopic clip. Using a glidewire, the 5 [place] catheter was successfully advanced into the splenic artery. Angiography was then performed in multiple obliquities. The splenic artery divides into a superior and inferior division. Both divisions give rise to some a short gastric arteries. A Renegade high flow microcatheter was successfully advanced into the distal  splenic artery and then into the superior division. Superior division short gastric arteries appear to provide blood flow more superiorly along the gastric fundus. The microcatheter was then successfully navigated into the inferior division of the splenic artery. The short gastric arteries are in close proximity to the endovascular clip. No definite active extravasation was identified. The decision was made to perform coil embolization. This will likely devascularize approximately 30-40% of the lower pole of the spleen. Coil embolization was performed using a combination of 2 and 3 mm interlock and vortex coils. Post embolization arteriography demonstrates complete cessation of flow in the arteries in the region of the previously placed endovascular clip. The catheters were removed. Hemostasis was attained with the assistance of an ExoSeal extra arterial vascular plug. IMPRESSION: 1. Successful prior coil embolization of the left gastric artery. 2. Coil embolization of the inferior division of the splenic artery and is associated short gastric arteries in the region of the previously placed endovascular clip. Angiography and embolization therapy has now been maximized. If the patient continues to hemorrhage, repeat endoscopy or surgery would likely be required. Signed, Criselda Peaches, MD [facility] Radiology Electronically Signed   By: Jacqulynn Cadet M.D.   On: 04/13/2017 19:09   Ir US Guide Vasc Access Right  Result Date: 04/12/2017 INDICATION: History of recent upper GI bleed, post endoscopy approximately 1 week ago resulting in placement of an endoscopy clip at the location of a bleeding gastric ulcer. Patient subsequently found to have a lower extremity DVT and was started on anti coagulation (patient has received 2 doses of Xarelto). Unfortunately, this resulted in a recurrent large upper GI bleed. Repeat upper endoscopy demonstrates a large amount of  blood products within the stomach with inability to achieve  hemostasis from endoscopic approach. As such, request made for emergent mesenteric arteriogram and potential percutaneous coil embolization as well as placement of an IVC filter for temporary caval interruption purposes. EXAM: 1. ULTRASOUND GUIDANCE FOR VENOUS AND ARTERIAL ACCESS 2. SELECTIVE CELIAC ARTERIOGRAM. 3. SELECTIVE ACCESSORY LEFT GASTRIC ARTERIOGRAM. 4. SELECTIVE ARTERIOGRAM OF DISTAL TRIBUTARY OF THE LEFT GASTRIC ARTERY (3rd ORDER) AND PERCUTANEOUS COIL EMBOLIZATION 5. IVC CATHETERIZATION AND VENOGRAM 6. IVC FILTER INSERTION COMPARISON:  CTA of the abdomen pelvis - 04/03/2017; chest CT - 04/07/2017 MEDICATIONS: None. ANESTHESIA/SEDATION: Versed 58 mg IV Sedation Time: minutes; The patient was continuously monitored during the procedure by the interventional radiology nurse under my direct supervision. CONTRAST:  75 cc Isovue 300 FLUOROSCOPY TIME:  9 Minutes 24 seconds (045 mGy) COMPLICATIONS: None immediate. PROCEDURE: Informed written consent was obtained from the patient's brother following explanation of the procedure, risks, benefits and alternatives. A time out was performed prior to the initiation of the procedure. The right groin was prepped and draped in usual sterile fashion. Maximal barrier sterile technique utilized including caps, mask, sterile gowns, sterile gloves, large sterile drape, hand hygiene, and chlorhexidine prep. The right femoral head was marked fluoroscopically. Under ultrasound guidance, the right common femoral artery was accessed with a micropuncture kit after the overlying soft tissues were anesthetized with 1% lidocaine. An ultrasound image was saved for documentation purposes. The micropuncture sheath was exchanged for a 6 [place] vascular sheath over a Bentson wire. (Note, a 6 [place] vascular sheath was selected for access given suspected hemodynamically significant stenosis within the mid aspect of the abdominal  aorta and potential necessity for angioplasty.) A closure arteriogram was performed through the side of the sheath confirming access within the right common femoral artery, however the 6 [place] vascular sheath was noted to be near occlusive secondary to a combination of atherosclerotic plaque as well as the vasospasm. With the use of a Kumpe catheter, a Bentson wire was advanced to the level of the caudal aspect of the thoracic aorta. Limited abdominal aortogram was performed demonstrating patency of the abdominal aorta with the 5 French catheter in place and as such abdominal aortic angioplasty would not be necessary and the decision was made to downsized the vascular sheath a 5 [place]. Again, closure arteriogram was performed demonstrating preferential flow through the right internal iliac artery with atretic flow surrounding the vascular sheath. Given patient's critical state the decision was made to proceed with the mesenteric arteriogram and embolization in an expeditious manner in lieu of abandoning the femoral access for a brachial approach. Over a Bentson wire, a Mickelson catheter was advanced to the level of the thoracic aorta where it was back bled and flushed. The catheter was then utilized to select the celiac artery and a selective celiac arteriogram was performed The Mickelson catheter was then utilized to select the left gastric artery and a selective left gastric arteriogram was performed. With the use of an 0.014 fathom microwire, a regular Renegade microcatheter was advanced into a distal tributary of the left gastric artery serving as the predominant arterial supply to the tangle of vessels regional to the endoscopy clip. Selective injection confirmed appropriate positioning. The distal aspect of the branch vessel was then percutaneously coil embolized with multiple overlapping 2 mm and 3 mm soft and regular interlock coils. The microcatheter was withdrawn into the central aspect of the left  gastric artery and a post embolization arteriogram was performed. The microcatheter was removed and a completion left gastric arteriogram was performed via  the Mickelson catheter Again, the Mickelson catheter was utilized to select the celiac artery and selective celiac arteriograms were performed in various obliquities. Images were reviewed and the arteriogram portion of the procedure was terminated. Completion closure arteriogram was performed. All wires, catheters and sheaths were removed from the patient. Hemostasis was achieved at the right groin access site with deployment of a ExoSeal closure device and manual compression. _________________________________________________________ Next, attention was made towards placement of the IVC filter. Under sterile condition and local anesthesia, right common femoral venous access was performed with ultrasound. An ultrasound image was saved and sent to PACS. Over a guidewire, the IVC filter delivery sheath and inner dilator were advanced into the IVC just above the IVC bifurcation. Contrast injection was performed for an IVC venogram. Through the delivery sheath, a retrievable Denali IVC filter was deployed below the level of the renal veins and above the IVC bifurcation. Limited post deployment venacavagram was performed. The delivery sheath was removed and hemostasis was obtained with manual compression. A dressing was placed. The patient tolerated the procedure well without immediate post procedural complication. FINDINGS: Selective celiac arteriogram demonstrates non conventional branching pattern without supply to the left gastric artery or significant supply to the gastric fundus, as was demonstrated on preceding CTA. As such, the accessory left gastric artery arising directly from the abdominal aorta was selected with sub selective arteriogram demonstrating a tangle of blood vessels contributing arterial supply to the portion of the stomach containing the  endoscopy clip. Caudal division of the accessory left gastric artery supplies the dominant arterial contribution to the portion of the stomach containing the endoscopy clip and as such, was percutaneously coil embolized with multiple overlapping 2 and 3 mm interlock coils to near the vessel's origin. Completion arteriogram demonstrates a technically excellent result with no definitive persistent arterial supply regional to the endoscopy clip. Following the percutaneous coil embolization, several additional selective arteriograms were performed in various obliquities, again confirming no definitive supply to the gastric fundus. As such, additional embolization was not performed. The IVC is patent. No evidence of thrombus, stenosis, or occlusion. No variant venous anatomy. Successful placement of the IVC filter below the level of the renal veins. IMPRESSION: 1. Technically successful percutaneous coil embolization of the caudal division of the accessory left gastric artery serving as the predominant arterial supply to the portion of the stomach containing the endoscopy clip. 2. Selective celiac arteriogram fails to delineate significant arterial supply to the gastric fundus and in particular, to the portion of stomach regional to the endoscopy clip. 3. Successful ultrasound and fluoroscopically guided placement of an infrarenal retrievable IVC filter. PLAN: - Patient is to lie flat for 4 hours. Continued aggressive resuscitation as per the critical care team. Note, as the patient has experienced an acute large bleeding episode, I would expect the patient's H and H has not yet reached a nadir but hopefully will stabilize in the next 24-48 hours. - The IVC filter is potentially retrievable. The patient will be assessed for filter retrieval by Interventional Radiology in approximately 8-12 weeks. Further recommendations regarding filter retrieval, continued surveillance or declaration of device permanence, will be made  at that time. Electronically Signed   By: Sandi Mariscal M.D.   On: 04/12/2017 07:04   Dg Chest Port 1 View  Result Date: 04/14/2017 CLINICAL DATA:  Respiratory failure EXAM: PORTABLE CHEST 1 VIEW COMPARISON:  04/13/2017 FINDINGS: Cardiac shadow is within normal limits. Endotracheal tube and nasogastric catheter are again noted in satisfactory position.  Previously seen left jugular line is been removed and a new right jugular line placed in satisfactory position. Bilateral pleural effusions right greater than left are noted with associated atelectatic changes. IMPRESSION: Increasing bilateral pleural effusions and atelectasis. Electronically Signed   By: Inez Catalina M.D.   On: 04/14/2017 06:59   Dg Chest Port 1 View  Result Date: 04/13/2017 CLINICAL DATA:  Respiratory failure EXAM: PORTABLE CHEST 1 VIEW COMPARISON:  Portable chest x-ray of April 12, 2017 FINDINGS: The lungs are reasonably well inflated. There is persistent increased density at both lung bases greatest on the left. The heart is normal in size. The pulmonary vascularity is less engorged today. The endotracheal tube tip projects 4.5 cm above the carina. The left internal jugular venous catheter tip projects at the junction of the right and left brachiocephalic veins. IMPRESSION: Decreased pulmonary edema. Persistent bibasilar subsegmental atelectasis or pneumonia greatest on the left. Electronically Signed   By: David  Martinique M.D.   On: 04/13/2017 07:00   Dg Chest Port 1 View  Result Date: 04/12/2017 CLINICAL DATA:  Central line placement EXAM: PORTABLE CHEST 1 VIEW COMPARISON:  04/12/2017 FINDINGS: Endotracheal tube tip measures 4.7 cm above the carina. A left jugular central venous catheter has been placed. Tip is over the mid mediastinum consistent with location in the brachiocephalic vein. No pneumothorax. Mild cardiac enlargement. Developing pulmonary vascular congestion with developing perihilar infiltration suggesting edema. No blunting  of costophrenic angles. IMPRESSION: Left central venous catheter tip projects over the brachiocephalic vein. No pneumothorax. Developing cardiac enlargement with perihilar edema. Electronically Signed   By: Lucienne Capers M.D.   On: 04/12/2017 04:05   Dg Chest Port 1 View  Result Date: 04/12/2017 CLINICAL DATA:  Intubation EXAM: PORTABLE CHEST 1 VIEW COMPARISON:  CT chest 04/07/2017.  Chest 04/03/2017 FINDINGS: Shallow inspiration. Heart size and pulmonary vascularity are normal. 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 An endotracheal tube is been placed with tip measuring 4.8 cm above the carina. Lungs are clear. No blunting of costophrenic angles. No pneumothorax. IMPRESSION: Endotracheal tube placed with tip measuring 4.8 cm above the carina. Lungs are clear. Electronically Signed   By: Lucienne Capers M.D.   On: 04/12/2017 01:22   Dg Abd Portable 1v  Result Date: 04/13/2017 CLINICAL DATA:  62 year old female with nasogastric tube placement. Subsequent encounter. EXAM: PORTABLE ABDOMEN - 1 VIEW COMPARISON:  04/13/2017 CT FINDINGS: Nasogastric tube tip pylorus level with side hole body/antrum junction. Residual contrast within dilated left renal collecting system. Abnormal bowel gas pattern.  Please see CT report. IMPRESSION: Nasogastric tube tip gastric pylorus level with side hole gastric body/antrum junction. Electronically Signed   By: Genia Del M.D.   On: 04/13/2017 15:36   [place] Guide Roadmapping  Result Date: 04/13/2017 INDICATION: 62 year old female with gastric ulcer and a visible bleeding vessel. Patient underwent embolization yesterday but continues to demonstrate evidence of upper GI bleeding. Repeat angiography is requested. EXAM: IR EMBO ART VEN HEMORR LYMPH EXTRAV INC GUIDE ROADMAPPING; ARTERIOGRAPHY; IR ULTRASOUND GUIDANCE VASC ACCESS RIGHT; SELECTIVE VISCERAL ARTERIOGRAPHY; ADDITIONAL ARTERIOGRAPHY; IR  RIGHT FLOURO GUIDE CV LINE 1. Ultrasound-guided access right common femoral artery 2. Catheterization and angiogram of the left gastric artery 3. Catheterization and angiogram the celiac axis 4. Catheterization and angiogram of the splenic artery 5. Catheterization and angiogram of the superior division of the splenic artery 6. Catheterization and angiogram of the inferior division of the splenic  artery 7. Coil embolization of the inferior division of the splenic artery MEDICATIONS: None ANESTHESIA/SEDATION: Moderate (conscious) sedation was employed during this procedure. A total of Versed 1 mg was administered intravenously. The patient was on a fentanyl drip. Moderate Sedation Time: 60 minutes. The patient's level of consciousness and vital signs were monitored continuously by radiology nursing throughout the procedure under my direct supervision. CONTRAST:  80 mL Isovue 300 FLUOROSCOPY TIME:  Fluoroscopy Time: 13 minutes 6 seconds (1299 mGy). COMPLICATIONS: None immediate. PROCEDURE: Informed consent was obtained from the patient following explanation of the procedure, risks, benefits and alternatives. The patient understands, agrees and consents for the procedure. All questions were addressed. A time out was performed prior to the initiation of the procedure. Maximal barrier sterile technique utilized including caps, mask, sterile gowns, sterile gloves, large sterile drape, hand hygiene, and Betadine prep. The right common femoral artery was interrogated with ultrasound and found to be widely patent. An image was obtained and stored for the medical record. Local anesthesia was attained by infiltration with 1% lidocaine. A small dermatotomy was made. Under real-time sonographic guidance, the vessel was punctured with a 21 gauge micropuncture needle. Using standard technique, the initial micro needle was exchanged over a 0.018 micro wire for a transitional 4 [place] micro sheath. The micro sheath was then  exchanged over a 0.035 wire for a 5 French vascular sheath. A C2 cobra catheter was advanced over a Bentson wire into the abdominal aorta. The catheter was used to select the left gastric artery which arises directly from the aorta. Catheter angiography was performed. Successful coil embolization of the left gastric artery on the prior angiogram. There is no significant perfusion in the region of the vascular clip. The C2 cobra catheter was then advanced into the celiac artery and a celiac arteriogram was performed. The vessels are significantly attenuated consistent with diffuse vaso spasm. Branches of the short gastric arteries are identified in the region of the previously placed endoscopic clip. Using a glidewire, the 5 [place] catheter was successfully advanced into the splenic artery. Angiography was then performed in multiple obliquities. The splenic artery divides into a superior and inferior division. Both divisions give rise to some a short gastric arteries. A Renegade high flow microcatheter was successfully advanced into the distal splenic artery and then into the superior division. Superior division short gastric arteries appear to provide blood flow more superiorly along the gastric fundus. The microcatheter was then successfully navigated into the inferior division of the splenic artery. The short gastric arteries are in close proximity to the endovascular clip. No definite active extravasation was identified. The decision was made to perform coil embolization. This will likely devascularize approximately 30-40% of the lower pole of the spleen. Coil embolization was performed using a combination of 2 and 3 mm interlock and vortex coils. Post embolization arteriography demonstrates complete cessation of flow in the arteries in the region of the previously placed endovascular clip. The catheters were removed. Hemostasis was attained with the assistance of an ExoSeal extra arterial vascular plug.  IMPRESSION: 1. Successful prior coil embolization of the left gastric artery. 2. Coil embolization of the inferior division of the splenic artery and is associated short gastric arteries in the region of the previously placed endovascular clip. Angiography and embolization therapy has now been maximized. If the patient continues to hemorrhage, repeat endoscopy or surgery would likely be required. Signed, Criselda Peaches, MD [facility] Radiology Electronically Signed   By: Myrle Sheng  Laurence Ferrari M.D.   On: 04/13/2017 19:09   [place] Guide Roadmapping  Result Date: 04/12/2017 INDICATION: History of recent upper GI bleed, post endoscopy approximately 1 week ago resulting in placement of an endoscopy clip at the location of a bleeding gastric ulcer. Patient subsequently found to have a lower extremity DVT and was started on anti coagulation (patient has received 2 doses of Xarelto). Unfortunately, this resulted in a recurrent large upper GI bleed. Repeat upper endoscopy demonstrates a large amount of blood products within the stomach with inability to achieve hemostasis from endoscopic approach. As such, request made for emergent mesenteric arteriogram and potential percutaneous coil embolization as well as placement of an IVC filter for temporary caval interruption purposes. EXAM: 1. ULTRASOUND GUIDANCE FOR VENOUS AND ARTERIAL ACCESS 2. SELECTIVE CELIAC ARTERIOGRAM. 3. SELECTIVE ACCESSORY LEFT GASTRIC ARTERIOGRAM. 4. SELECTIVE ARTERIOGRAM OF DISTAL TRIBUTARY OF THE LEFT GASTRIC ARTERY (3rd ORDER) AND PERCUTANEOUS COIL EMBOLIZATION 5. IVC CATHETERIZATION AND VENOGRAM 6. IVC FILTER INSERTION COMPARISON:  CTA of the abdomen pelvis - 04/03/2017; chest CT - 04/07/2017 MEDICATIONS: None. ANESTHESIA/SEDATION: Versed 58 mg IV Sedation Time: minutes; The patient was continuously monitored during the procedure by the interventional radiology  nurse under my direct supervision. CONTRAST:  75 cc Isovue 300 FLUOROSCOPY TIME:  9 Minutes 24 seconds (644 mGy) COMPLICATIONS: None immediate. PROCEDURE: Informed written consent was obtained from the patient's brother following explanation of the procedure, risks, benefits and alternatives. A time out was performed prior to the initiation of the procedure. The right groin was prepped and draped in usual sterile fashion. Maximal barrier sterile technique utilized including caps, mask, sterile gowns, sterile gloves, large sterile drape, hand hygiene, and chlorhexidine prep. The right femoral head was marked fluoroscopically. Under ultrasound guidance, the right common femoral artery was accessed with a micropuncture kit after the overlying soft tissues were anesthetized with 1% lidocaine. An ultrasound image was saved for documentation purposes. The micropuncture sheath was exchanged for a 6 [place] vascular sheath over a Bentson wire. (Note, a 6 [place] vascular sheath was selected for access given suspected hemodynamically significant stenosis within the mid aspect of the abdominal aorta and potential necessity for angioplasty.) A closure arteriogram was performed through the side of the sheath confirming access within the right common femoral artery, however the 6 [place] vascular sheath was noted to be near occlusive secondary to a combination of atherosclerotic plaque as well as the vasospasm. With the use of a Kumpe catheter, a Bentson wire was advanced to the level of the caudal aspect of the thoracic aorta. Limited abdominal aortogram was performed demonstrating patency of the abdominal aorta with the 5 French catheter in place and as such abdominal aortic angioplasty would not be necessary and the decision was made to downsized the vascular sheath a 5 [place]. Again, closure arteriogram was performed demonstrating preferential flow through the right internal iliac artery with atretic flow surrounding the  vascular sheath. Given patient's critical state the decision was made to proceed with the mesenteric arteriogram and embolization in an expeditious manner in lieu of abandoning the femoral access for a brachial approach. Over a Bentson wire, a Mickelson catheter was advanced to the level of the thoracic aorta where it was back bled and flushed. The catheter was then utilized to select the celiac artery and a selective celiac arteriogram was performed The Mickelson catheter was then utilized to select the left gastric artery and a selective left gastric arteriogram was performed.  With the use of an 0.014 fathom microwire, a regular Renegade microcatheter was advanced into a distal tributary of the left gastric artery serving as the predominant arterial supply to the tangle of vessels regional to the endoscopy clip. Selective injection confirmed appropriate positioning. The distal aspect of the branch vessel was then percutaneously coil embolized with multiple overlapping 2 mm and 3 mm soft and regular interlock coils. The microcatheter was withdrawn into the central aspect of the left gastric artery and a post embolization arteriogram was performed. The microcatheter was removed and a completion left gastric arteriogram was performed via the Mickelson catheter Again, the Mickelson catheter was utilized to select the celiac artery and selective celiac arteriograms were performed in various obliquities. Images were reviewed and the arteriogram portion of the procedure was terminated. Completion closure arteriogram was performed. All wires, catheters and sheaths were removed from the patient. Hemostasis was achieved at the right groin access site with deployment of a ExoSeal closure device and manual compression. _________________________________________________________ Next, attention was made towards placement of the IVC filter. Under sterile condition and local anesthesia, right common femoral venous access was  performed with ultrasound. An ultrasound image was saved and sent to PACS. Over a guidewire, the IVC filter delivery sheath and inner dilator were advanced into the IVC just above the IVC bifurcation. Contrast injection was performed for an IVC venogram. Through the delivery sheath, a retrievable Denali IVC filter was deployed below the level of the renal veins and above the IVC bifurcation. Limited post deployment venacavagram was performed. The delivery sheath was removed and hemostasis was obtained with manual compression. A dressing was placed. The patient tolerated the procedure well without immediate post procedural complication. FINDINGS: Selective celiac arteriogram demonstrates non conventional branching pattern without supply to the left gastric artery or significant supply to the gastric fundus, as was demonstrated on preceding CTA. As such, the accessory left gastric artery arising directly from the abdominal aorta was selected with sub selective arteriogram demonstrating a tangle of blood vessels contributing arterial supply to the portion of the stomach containing the endoscopy clip. Caudal division of the accessory left gastric artery supplies the dominant arterial contribution to the portion of the stomach containing the endoscopy clip and as such, was percutaneously coil embolized with multiple overlapping 2 and 3 mm interlock coils to near the vessel's origin. Completion arteriogram demonstrates a technically excellent result with no definitive persistent arterial supply regional to the endoscopy clip. Following the percutaneous coil embolization, several additional selective arteriograms were performed in various obliquities, again confirming no definitive supply to the gastric fundus. As such, additional embolization was not performed. The IVC is patent. No evidence of thrombus, stenosis, or occlusion. No variant venous anatomy. Successful placement of the IVC filter below the level of the renal  veins. IMPRESSION: 1. Technically successful percutaneous coil embolization of the caudal division of the accessory left gastric artery serving as the predominant arterial supply to the portion of the stomach containing the endoscopy clip. 2. Selective celiac arteriogram fails to delineate significant arterial supply to the gastric fundus and in particular, to the portion of stomach regional to the endoscopy clip. 3. Successful ultrasound and fluoroscopically guided placement of an infrarenal retrievable IVC filter. PLAN: - Patient is to lie flat for 4 hours. Continued aggressive resuscitation as per the critical care team. Note, as the patient has experienced an acute large bleeding episode, I would expect the patient's H and H has not yet reached a nadir but hopefully will  stabilize in the next 24-48 hours. - The IVC filter is potentially retrievable. The patient will be assessed for filter retrieval by Interventional Radiology in approximately 8-12 weeks. Further recommendations regarding filter retrieval, continued surveillance or declaration of device permanence, will be made at that time. Electronically Signed   By: Sandi Mariscal M.D.   On: 04/12/2017 07:04    Labs:  CBC: Recent Labs    04/11/17 0328  04/12/17 0412  04/13/17 0346  04/13/17 2325 04/14/17 0338 04/14/17 0800 04/14/17 0905  WBC 8.8  --  35.2*  --  23.5*  --   --  22.6*  --   --   HGB 7.8*   < > 9.6*   < > 4.5*   < > 9.3* 8.9* 5.1* 8.4*  HCT 24.0*   < > 28.3*   < > 13.0*   < > 27.2* 26.0* 15.3* 25.0*  PLT 176  --  72*  --  66*  --   --  72*  --   --    < > = values in this interval not displayed.    COAGS: Recent Labs    04/02/17 1459 04/02/17 1638 04/03/17 0409 04/13/17 0926 04/14/17 0920  INR 1.14  --  1.10 1.97 1.27  APTT  --  27 25  --   --     BMP: Recent Labs    04/12/17 1107 04/12/17 2000 04/13/17 0346 04/14/17 0044  NA 146* 148* 145 146*  K 3.3* 3.0* 3.9 3.8  CL 106 113* 105 103  CO2 '26 28 28 31    '$ GLUCOSE 166* 107* 223* 131*  BUN '11 9 12 16  '$ CALCIUM 6.7* 6.0* 6.4* 7.2*  CREATININE 1.15* 1.09* 1.46* 1.59*  GFRNONAA 49* 52* 37* 33*  GFRAA 57* >60 43* 39*    LIVER FUNCTION TESTS: Recent Labs    04/02/17 1459 04/05/17 1504 04/07/17 1006 04/08/17 0248  BILITOT 0.7 0.5 0.3 0.8  AST 106* 47* 24 20  ALT 162* 70* 40 33  ALKPHOS 118 64 51 42  PROT 5.2* 5.2* 4.4* 4.0*  ALBUMIN 2.1* 2.2* 1.9* 1.9*    Assessment and Plan:  GI bleed Embolization in IR 2/3 and 2/4 Stable No apparent active bleed  Electronically Signed: Tynan Boesel A, PA-C 04/14/2017, 10:45 AM   I spent a total of 15 Minutes at the the patient's bedside AND on the patient's hospital floor or unit, greater than 50% of which was counseling/coordinating care for GI bleed/embolization x 2

## 2017-04-15 ENCOUNTER — Inpatient Hospital Stay (HOSPITAL_COMMUNITY): Payer: BLUE CROSS/BLUE SHIELD

## 2017-04-15 LAB — RENAL FUNCTION PANEL
Albumin: 1.5 g/dL — ABNORMAL LOW (ref 3.5–5.0)
Anion gap: 12 (ref 5–15)
BUN: 18 mg/dL (ref 6–20)
CO2: 29 mmol/L (ref 22–32)
Calcium: 7.5 mg/dL — ABNORMAL LOW (ref 8.9–10.3)
Chloride: 105 mmol/L (ref 101–111)
Creatinine, Ser: 1.6 mg/dL — ABNORMAL HIGH (ref 0.44–1.00)
GFR calc Af Amer: 38 mL/min — ABNORMAL LOW (ref >60)
GFR calc non Af Amer: 33 mL/min — ABNORMAL LOW (ref >60)
Glucose, Bld: 92 mg/dL (ref 65–99)
Phosphorus: 4.9 mg/dL — ABNORMAL HIGH (ref 2.5–4.6)
Potassium: 3.6 mmol/L (ref 3.5–5.1)
Sodium: 146 mmol/L — ABNORMAL HIGH (ref 135–145)

## 2017-04-15 LAB — HEMOGLOBIN AND HEMATOCRIT, BLOOD
HCT: 21.5 % — ABNORMAL LOW (ref 36.0–46.0)
HCT: 23.4 % — ABNORMAL LOW (ref 36.0–46.0)
HCT: 25.3 % — ABNORMAL LOW (ref 36.0–46.0)
HCT: 25.9 % — ABNORMAL LOW (ref 36.0–46.0)
HCT: 26.3 % — ABNORMAL LOW (ref 36.0–46.0)
Hemoglobin: 7 g/dL — ABNORMAL LOW (ref 12.0–15.0)
Hemoglobin: 7.7 g/dL — ABNORMAL LOW (ref 12.0–15.0)
Hemoglobin: 8.2 g/dL — ABNORMAL LOW (ref 12.0–15.0)
Hemoglobin: 8.4 g/dL — ABNORMAL LOW (ref 12.0–15.0)
Hemoglobin: 8.5 g/dL — ABNORMAL LOW (ref 12.0–15.0)

## 2017-04-15 LAB — GLUCOSE, CAPILLARY
Glucose-Capillary: 76 mg/dL (ref 65–99)
Glucose-Capillary: 77 mg/dL (ref 65–99)
Glucose-Capillary: 79 mg/dL (ref 65–99)
Glucose-Capillary: 80 mg/dL (ref 65–99)
Glucose-Capillary: 90 mg/dL (ref 65–99)
Glucose-Capillary: 95 mg/dL (ref 65–99)

## 2017-04-15 LAB — CBC
HCT: 26.3 % — ABNORMAL LOW (ref 36.0–46.0)
HCT: 27.5 % — ABNORMAL LOW (ref 36.0–46.0)
Hemoglobin: 8.5 g/dL — ABNORMAL LOW (ref 12.0–15.0)
Hemoglobin: 8.8 g/dL — ABNORMAL LOW (ref 12.0–15.0)
MCH: 30.1 pg (ref 26.0–34.0)
MCH: 30.1 pg (ref 26.0–34.0)
MCHC: 32.3 g/dL (ref 30.0–36.0)
MCHC: 32 g/dL (ref 30.0–36.0)
MCV: 93.3 fL (ref 78.0–100.0)
MCV: 94.2 fL (ref 78.0–100.0)
Platelets: 54 10*3/uL — ABNORMAL LOW (ref 150–400)
Platelets: 57 10*3/uL — ABNORMAL LOW (ref 150–400)
RBC: 2.82 MIL/uL — ABNORMAL LOW (ref 3.87–5.11)
RBC: 2.92 MIL/uL — ABNORMAL LOW (ref 3.87–5.11)
RDW: 17.2 % — ABNORMAL HIGH (ref 11.5–15.5)
RDW: 18 % — ABNORMAL HIGH (ref 11.5–15.5)
WBC: 12.3 10*3/uL — ABNORMAL HIGH (ref 4.0–10.5)
WBC: 12.4 10*3/uL — ABNORMAL HIGH (ref 4.0–10.5)

## 2017-04-15 LAB — PREPARE RBC (CROSSMATCH)

## 2017-04-15 LAB — PROTIME-INR
INR: 1.19
Prothrombin Time: 15 seconds (ref 11.4–15.2)

## 2017-04-15 MED ORDER — FUROSEMIDE 10 MG/ML IJ SOLN
60.0000 mg | Freq: Once | INTRAMUSCULAR | Status: AC
  Administered 2017-04-15: 60 mg via INTRAVENOUS
  Filled 2017-04-15: qty 6

## 2017-04-15 MED ORDER — SODIUM CHLORIDE 0.9 % IV SOLN
Freq: Once | INTRAVENOUS | Status: AC
  Administered 2017-04-15: 02:00:00 via INTRAVENOUS

## 2017-04-15 MED ORDER — ORAL CARE MOUTH RINSE
15.0000 mL | Freq: Two times a day (BID) | OROMUCOSAL | Status: DC
  Administered 2017-04-16: 15 mL via OROMUCOSAL

## 2017-04-15 MED ORDER — BUDESONIDE 0.5 MG/2ML IN SUSP
0.5000 mg | Freq: Two times a day (BID) | RESPIRATORY_TRACT | Status: DC
  Administered 2017-04-15 – 2017-04-22 (×10): 0.5 mg via RESPIRATORY_TRACT
  Filled 2017-04-15 (×16): qty 2

## 2017-04-15 MED ORDER — ARFORMOTEROL TARTRATE 15 MCG/2ML IN NEBU
15.0000 ug | INHALATION_SOLUTION | Freq: Two times a day (BID) | RESPIRATORY_TRACT | Status: DC
  Administered 2017-04-15 – 2017-04-22 (×10): 15 ug via RESPIRATORY_TRACT
  Filled 2017-04-15 (×16): qty 2

## 2017-04-15 MED ORDER — WHITE PETROLATUM EX OINT
TOPICAL_OINTMENT | CUTANEOUS | Status: AC
  Administered 2017-04-15: 12:00:00
  Filled 2017-04-15: qty 28.35

## 2017-04-15 NOTE — Progress Notes (Signed)
Wasted 140cc fentanyl gtt in sink with [employer].

## 2017-04-15 NOTE — Progress Notes (Signed)
Subjective: Overnight events noted.  Objective: Vital signs in last 24 hours: Temp:  [97.6 F (36.4 C)-98.9 F (37.2 C)] 98.5 F (36.9 C) (02/06 0712) Pulse Rate:  [46-99] 61 (02/06 0600) Resp:  [7-27] 18 (02/06 0600) BP: (93-163)/(31-137) 132/48 (02/06 0600) SpO2:  [90 %-100 %] 100 % (02/06 0600) Arterial Line BP: (73-99)/(45-70) 99/45 (02/05 0900) FiO2 (%):  [30 %-60 %] 40 % (02/06 0400) Weight:  [88.6 kg (195 lb 5.2 oz)] 88.6 kg (195 lb 5.2 oz) (02/06 0500) Last BM Date: 04/14/17  Intake/Output from previous day: 02/05 0701 - 02/06 0700 In: 2326.2 [I.V.:2011.2; Blood:315] Out: 1916 [Urine:1315] Intake/Output this shift: No intake/output data recorded.  General appearance: intubated and sleeping GI: soft, non-tender; bowel sounds normal; no masses,  no organomegaly  Lab Results: Recent Labs    04/13/17 0346  04/14/17 0338  04/14/17 2026 04/15/17 0033 04/15/17 0507  WBC 23.5*  --  22.6*  --   --   --  12.3*  HGB 4.5*   < > 8.9*   < > 7.1* 7.0* 8.5*  HCT 13.0*   < > 26.0*   < > 21.5* 21.5* 26.3*  PLT 66*  --  72*  --   --   --  54*   < > = values in this interval not displayed.   BMET Recent Labs    04/13/17 0346 04/14/17 0044 04/15/17 0507  NA 145 146* 146*  K 3.9 3.8 3.6  CL 105 103 105  CO2 _0 GLUCOSE 223* 131* 92  BUN _1 CREATININE 1.46* 1.59* 1.60*  CALCIUM 6.4* 7.2* 7.5*   LFT Recent Labs    04/15/17 0507  ALBUMIN 1.5*   PT/INR Recent Labs    04/13/17 0926 04/14/17 0920  LABPROT 22.3* 15.8*  INR 1.97 1.27   Hepatitis Panel No results for input(s): HEPBSAG, HCVAB, HEPAIGM, HEPBIGM in the last 72 hours. C-Diff No results for input(s): CDIFFTOX in the last 72 hours. Fecal Lactopherrin No results for input(s): FECLLACTOFRN in the last 72 hours.  Studies/Results: Ct Abdomen Pelvis Wo Contrast  Result Date: 04/13/2017 CLINICAL DATA:  Evaluate for retroperitoneal hematoma status post arterial access. EXAM: CT ABDOMEN AND  PELVIS WITHOUT CONTRAST TECHNIQUE: Multidetector CT imaging of the abdomen and pelvis was performed following the standard protocol without IV contrast. COMPARISON:  04/03/2017 FINDINGS: Lower chest: Moderate bilateral pleural effusions with overlying compressive type atelectasis. Hepatobiliary: No focal liver abnormality. Vicarious excretion of contrast material into the gallbladder is noted. Pancreas: Unremarkable. No pancreatic ductal dilatation or surrounding inflammatory changes. Spleen: Normal in size without focal abnormality. Adrenals/Urinary Tract: Normal adrenal glands. There is a striated nephrographic appearance of both kidneys, right greater than left. IV contrast material is identified within a dilated left renal collecting system and ureter. Asymmetrically diminished contrast within the right renal collecting system identified. Urinary bladder collapsed around a Foley catheter. Stomach/Bowel: There is a gastrostomy tube with tip in the distal stomach. Moderate to increased attenuation material is identified within the gastric lumen, image 17 of series 3. There also high attenuation material is identified within the small and large bowel loops. Vascular/Lymphatic: Aortic atherosclerosis. No aneurysm. Filter identified within the IVC. There is no adenopathy. Reproductive: Status post hysterectomy. No adnexal masses. Other: There is a small volume of ascites identified. Small to moderate volume of ascites within the right upper quadrant of the abdomen and right lower quadrant of the abdomen and pelvis. There is diffuse body wall edema compatible  with anasarca. No retroperitoneal hematoma identified. Musculoskeletal: No acute or significant osseous findings. IMPRESSION: 1. No evidence for retroperitoneal hematoma. 2. Striated nephrographic appearance of both kidneys identified, right greater than left. Imaging findings are favored to represent sequelae of acute tubular necrosis. Not excluded but favored  less likely would be an embolic phenomenon resulting in areas of renal infarct. 3. Left-sided hydronephrosis and hydroureter. Etiology indeterminate. Cannot rule out distal ureteral obstruction. 4. Bilateral pleural effusions, ascites and diffuse body wall edema compatible with fluid overload. 5. Intermediate to high attenuation material within the stomach. Although conceivably this could be related to it administration of enteric contrast material ongoing bleeding within the gastric lumen is not excluded. 6.  Aortic Atherosclerosis (ICD10-I70.0). Electronically Signed   By: Kerby Moors M.D.   On: 04/13/2017 12:00   Ir Angiogram Visceral Selective  Result Date: 04/13/2017 INDICATION: 62 year old female with gastric ulcer and a visible bleeding vessel. Patient underwent embolization yesterday but continues to demonstrate evidence of upper GI bleeding. Repeat angiography is requested. EXAM: IR EMBO ART VEN HEMORR LYMPH EXTRAV INC GUIDE ROADMAPPING; ARTERIOGRAPHY; IR ULTRASOUND GUIDANCE VASC ACCESS RIGHT; SELECTIVE VISCERAL ARTERIOGRAPHY; ADDITIONAL ARTERIOGRAPHY; IR RIGHT FLOURO GUIDE CV LINE 1. Ultrasound-guided access right common femoral artery 2. Catheterization and angiogram of the left gastric artery 3. Catheterization and angiogram the celiac axis 4. Catheterization and angiogram of the splenic artery 5. Catheterization and angiogram of the superior division of the splenic artery 6. Catheterization and angiogram of the inferior division of the splenic artery 7. Coil embolization of the inferior division of the splenic artery MEDICATIONS: None ANESTHESIA/SEDATION: Moderate (conscious) sedation was employed during this procedure. A total of Versed 1 mg was administered intravenously. The patient was on a fentanyl drip. Moderate Sedation Time: 60 minutes. The patient's level of consciousness and vital signs were monitored continuously by radiology nursing throughout the procedure under my direct supervision.  CONTRAST:  80 mL Isovue 300 FLUOROSCOPY TIME:  Fluoroscopy Time: 13 minutes 6 seconds (1299 mGy). COMPLICATIONS: None immediate. PROCEDURE: Informed consent was obtained from the patient following explanation of the procedure, risks, benefits and alternatives. The patient understands, agrees and consents for the procedure. All questions were addressed. A time out was performed prior to the initiation of the procedure. Maximal barrier sterile technique utilized including caps, mask, sterile gowns, sterile gloves, large sterile drape, hand hygiene, and Betadine prep. The right common femoral artery was interrogated with ultrasound and found to be widely patent. An image was obtained and stored for the medical record. Local anesthesia was attained by infiltration with 1% lidocaine. A small dermatotomy was made. Under real-time sonographic guidance, the vessel was punctured with a 21 gauge micropuncture needle. Using standard technique, the initial micro needle was exchanged over a 0.018 micro wire for a transitional 4 [place] micro sheath. The micro sheath was then exchanged over a 0.035 wire for a 5 French vascular sheath. A C2 cobra catheter was advanced over a Bentson wire into the abdominal aorta. The catheter was used to select the left gastric artery which arises directly from the aorta. Catheter angiography was performed. Successful coil embolization of the left gastric artery on the prior angiogram. There is no significant perfusion in the region of the vascular clip. The C2 cobra catheter was then advanced into the celiac artery and a celiac arteriogram was performed. The vessels are significantly attenuated consistent with diffuse vaso spasm. Branches of the short gastric arteries are identified in the region of the previously placed endoscopic clip. Using  a glidewire, the 5 [place] catheter was successfully advanced into the splenic artery. Angiography was then performed in multiple obliquities. The splenic  artery divides into a superior and inferior division. Both divisions give rise to some a short gastric arteries. A Renegade high flow microcatheter was successfully advanced into the distal splenic artery and then into the superior division. Superior division short gastric arteries appear to provide blood flow more superiorly along the gastric fundus. The microcatheter was then successfully navigated into the inferior division of the splenic artery. The short gastric arteries are in close proximity to the endovascular clip. No definite active extravasation was identified. The decision was made to perform coil embolization. This will likely devascularize approximately 30-40% of the lower pole of the spleen. Coil embolization was performed using a combination of 2 and 3 mm interlock and vortex coils. Post embolization arteriography demonstrates complete cessation of flow in the arteries in the region of the previously placed endovascular clip. The catheters were removed. Hemostasis was attained with the assistance of an ExoSeal extra arterial vascular plug. IMPRESSION: 1. Successful prior coil embolization of the left gastric artery. 2. Coil embolization of the inferior division of the splenic artery and is associated short gastric arteries in the region of the previously placed endovascular clip. Angiography and embolization therapy has now been maximized. If the patient continues to hemorrhage, repeat endoscopy or surgery would likely be required. Signed, Criselda Peaches, MD [facility] Radiology Electronically Signed   By: Jacqulynn Cadet M.D.   On: 04/13/2017 19:09   Ir Angiogram Selective Each Additional Vessel  Result Date: 04/14/2017 INDICATION: 62 year old female with gastric ulcer and a visible bleeding vessel. Patient underwent embolization yesterday but continues to demonstrate evidence of upper GI bleeding. Repeat angiography is requested. EXAM: IR  EMBO ART VEN HEMORR LYMPH EXTRAV INC GUIDE ROADMAPPING; ARTERIOGRAPHY; IR ULTRASOUND GUIDANCE VASC ACCESS RIGHT; SELECTIVE VISCERAL ARTERIOGRAPHY; ADDITIONAL ARTERIOGRAPHY; IR RIGHT FLOURO GUIDE CV LINE 1. Ultrasound-guided access right common femoral artery 2. Catheterization and angiogram of the left gastric artery 3. Catheterization and angiogram the celiac axis 4. Catheterization and angiogram of the splenic artery 5. Catheterization and angiogram of the superior division of the splenic artery 6. Catheterization and angiogram of the inferior division of the splenic artery 7. Coil embolization of the inferior division of the splenic artery MEDICATIONS: None ANESTHESIA/SEDATION: Moderate (conscious) sedation was employed during this procedure. A total of Versed 1 mg was administered intravenously. The patient was on a fentanyl drip. Moderate Sedation Time: 60 minutes. The patient's level of consciousness and vital signs were monitored continuously by radiology nursing throughout the procedure under my direct supervision. CONTRAST:  80 mL Isovue 300 FLUOROSCOPY TIME:  Fluoroscopy Time: 13 minutes 6 seconds (1299 mGy). COMPLICATIONS: None immediate. PROCEDURE: Informed consent was obtained from the patient following explanation of the procedure, risks, benefits and alternatives. The patient understands, agrees and consents for the procedure. All questions were addressed. A time out was performed prior to the initiation of the procedure. Maximal barrier sterile technique utilized including caps, mask, sterile gowns, sterile gloves, large sterile drape, hand hygiene, and Betadine prep. The right common femoral artery was interrogated with ultrasound and found to be widely patent. An image was obtained and stored for the medical record. Local anesthesia was attained by infiltration with 1% lidocaine. A small dermatotomy was made. Under real-time sonographic guidance, the vessel was punctured with a 21 gauge  micropuncture needle. Using standard technique, the initial micro needle was  exchanged over a 0.018 micro wire for a transitional 4 [place] micro sheath. The micro sheath was then exchanged over a 0.035 wire for a 5 French vascular sheath. A C2 cobra catheter was advanced over a Bentson wire into the abdominal aorta. The catheter was used to select the left gastric artery which arises directly from the aorta. Catheter angiography was performed. Successful coil embolization of the left gastric artery on the prior angiogram. There is no significant perfusion in the region of the vascular clip. The C2 cobra catheter was then advanced into the celiac artery and a celiac arteriogram was performed. The vessels are significantly attenuated consistent with diffuse vaso spasm. Branches of the short gastric arteries are identified in the region of the previously placed endoscopic clip. Using a glidewire, the 5 [place] catheter was successfully advanced into the splenic artery. Angiography was then performed in multiple obliquities. The splenic artery divides into a superior and inferior division. Both divisions give rise to some a short gastric arteries. A Renegade high flow microcatheter was successfully advanced into the distal splenic artery and then into the superior division. Superior division short gastric arteries appear to provide blood flow more superiorly along the gastric fundus. The microcatheter was then successfully navigated into the inferior division of the splenic artery. The short gastric arteries are in close proximity to the endovascular clip. No definite active extravasation was identified. The decision was made to perform coil embolization. This will likely devascularize approximately 30-40% of the lower pole of the spleen. Coil embolization was performed using a combination of 2 and 3 mm interlock and vortex coils. Post embolization arteriography demonstrates complete cessation of flow in the arteries in  the region of the previously placed endovascular clip. The catheters were removed. Hemostasis was attained with the assistance of an ExoSeal extra arterial vascular plug. IMPRESSION: 1. Successful prior coil embolization of the left gastric artery. 2. Coil embolization of the inferior division of the splenic artery and is associated short gastric arteries in the region of the previously placed endovascular clip. Angiography and embolization therapy has now been maximized. If the patient continues to hemorrhage, repeat endoscopy or surgery would likely be required. Signed, Criselda Peaches, MD [facility] Radiology Electronically Signed   By: Jacqulynn Cadet M.D.   On: 04/13/2017 19:09   Ir Angiogram Selective Each Additional Vessel  Result Date: 04/13/2017 INDICATION: 62 year old female with gastric ulcer and a visible bleeding vessel. Patient underwent embolization yesterday but continues to demonstrate evidence of upper GI bleeding. Repeat angiography is requested. EXAM: IR EMBO ART VEN HEMORR LYMPH EXTRAV INC GUIDE ROADMAPPING; ARTERIOGRAPHY; IR ULTRASOUND GUIDANCE VASC ACCESS RIGHT; SELECTIVE VISCERAL ARTERIOGRAPHY; ADDITIONAL ARTERIOGRAPHY; IR RIGHT FLOURO GUIDE CV LINE 1. Ultrasound-guided access right common femoral artery 2. Catheterization and angiogram of the left gastric artery 3. Catheterization and angiogram the celiac axis 4. Catheterization and angiogram of the splenic artery 5. Catheterization and angiogram of the superior division of the splenic artery 6. Catheterization and angiogram of the inferior division of the splenic artery 7. Coil embolization of the inferior division of the splenic artery MEDICATIONS: None ANESTHESIA/SEDATION: Moderate (conscious) sedation was employed during this procedure. A total of Versed 1 mg was administered intravenously. The patient was on a fentanyl drip. Moderate Sedation Time: 60 minutes. The patient's  level of consciousness and vital signs were monitored continuously by radiology nursing throughout the procedure under my direct supervision. CONTRAST:  80 mL Isovue 300 FLUOROSCOPY TIME:  Fluoroscopy Time: 13  minutes 6 seconds (1299 mGy). COMPLICATIONS: None immediate. PROCEDURE: Informed consent was obtained from the patient following explanation of the procedure, risks, benefits and alternatives. The patient understands, agrees and consents for the procedure. All questions were addressed. A time out was performed prior to the initiation of the procedure. Maximal barrier sterile technique utilized including caps, mask, sterile gowns, sterile gloves, large sterile drape, hand hygiene, and Betadine prep. The right common femoral artery was interrogated with ultrasound and found to be widely patent. An image was obtained and stored for the medical record. Local anesthesia was attained by infiltration with 1% lidocaine. A small dermatotomy was made. Under real-time sonographic guidance, the vessel was punctured with a 21 gauge micropuncture needle. Using standard technique, the initial micro needle was exchanged over a 0.018 micro wire for a transitional 4 [place] micro sheath. The micro sheath was then exchanged over a 0.035 wire for a 5 French vascular sheath. A C2 cobra catheter was advanced over a Bentson wire into the abdominal aorta. The catheter was used to select the left gastric artery which arises directly from the aorta. Catheter angiography was performed. Successful coil embolization of the left gastric artery on the prior angiogram. There is no significant perfusion in the region of the vascular clip. The C2 cobra catheter was then advanced into the celiac artery and a celiac arteriogram was performed. The vessels are significantly attenuated consistent with diffuse vaso spasm. Branches of the short gastric arteries are identified in the region of the previously placed endoscopic clip. Using a glidewire,  the 5 [place] catheter was successfully advanced into the splenic artery. Angiography was then performed in multiple obliquities. The splenic artery divides into a superior and inferior division. Both divisions give rise to some a short gastric arteries. A Renegade high flow microcatheter was successfully advanced into the distal splenic artery and then into the superior division. Superior division short gastric arteries appear to provide blood flow more superiorly along the gastric fundus. The microcatheter was then successfully navigated into the inferior division of the splenic artery. The short gastric arteries are in close proximity to the endovascular clip. No definite active extravasation was identified. The decision was made to perform coil embolization. This will likely devascularize approximately 30-40% of the lower pole of the spleen. Coil embolization was performed using a combination of 2 and 3 mm interlock and vortex coils. Post embolization arteriography demonstrates complete cessation of flow in the arteries in the region of the previously placed endovascular clip. The catheters were removed. Hemostasis was attained with the assistance of an ExoSeal extra arterial vascular plug. IMPRESSION: 1. Successful prior coil embolization of the left gastric artery. 2. Coil embolization of the inferior division of the splenic artery and is associated short gastric arteries in the region of the previously placed endovascular clip. Angiography and embolization therapy has now been maximized. If the patient continues to hemorrhage, repeat endoscopy or surgery would likely be required. Signed, Criselda Peaches, MD [facility] Radiology Electronically Signed   By: Jacqulynn Cadet M.D.   On: 04/13/2017 19:09   Ir Angiogram Selective Each Additional Vessel  Result Date: 04/13/2017 INDICATION: 62 year old female with gastric ulcer and a visible bleeding vessel.  Patient underwent embolization yesterday but continues to demonstrate evidence of upper GI bleeding. Repeat angiography is requested. EXAM: IR EMBO ART VEN HEMORR LYMPH EXTRAV INC GUIDE ROADMAPPING; ARTERIOGRAPHY; IR ULTRASOUND GUIDANCE VASC ACCESS RIGHT; SELECTIVE VISCERAL ARTERIOGRAPHY; ADDITIONAL ARTERIOGRAPHY; IR RIGHT FLOURO GUIDE CV LINE  1. Ultrasound-guided access right common femoral artery 2. Catheterization and angiogram of the left gastric artery 3. Catheterization and angiogram the celiac axis 4. Catheterization and angiogram of the splenic artery 5. Catheterization and angiogram of the superior division of the splenic artery 6. Catheterization and angiogram of the inferior division of the splenic artery 7. Coil embolization of the inferior division of the splenic artery MEDICATIONS: None ANESTHESIA/SEDATION: Moderate (conscious) sedation was employed during this procedure. A total of Versed 1 mg was administered intravenously. The patient was on a fentanyl drip. Moderate Sedation Time: 60 minutes. The patient's level of consciousness and vital signs were monitored continuously by radiology nursing throughout the procedure under my direct supervision. CONTRAST:  80 mL Isovue 300 FLUOROSCOPY TIME:  Fluoroscopy Time: 13 minutes 6 seconds (1299 mGy). COMPLICATIONS: None immediate. PROCEDURE: Informed consent was obtained from the patient following explanation of the procedure, risks, benefits and alternatives. The patient understands, agrees and consents for the procedure. All questions were addressed. A time out was performed prior to the initiation of the procedure. Maximal barrier sterile technique utilized including caps, mask, sterile gowns, sterile gloves, large sterile drape, hand hygiene, and Betadine prep. The right common femoral artery was interrogated with ultrasound and found to be widely patent. An image was obtained and stored for the medical record. Local anesthesia was attained by  infiltration with 1% lidocaine. A small dermatotomy was made. Under real-time sonographic guidance, the vessel was punctured with a 21 gauge micropuncture needle. Using standard technique, the initial micro needle was exchanged over a 0.018 micro wire for a transitional 4 [place] micro sheath. The micro sheath was then exchanged over a 0.035 wire for a 5 French vascular sheath. A C2 cobra catheter was advanced over a Bentson wire into the abdominal aorta. The catheter was used to select the left gastric artery which arises directly from the aorta. Catheter angiography was performed. Successful coil embolization of the left gastric artery on the prior angiogram. There is no significant perfusion in the region of the vascular clip. The C2 cobra catheter was then advanced into the celiac artery and a celiac arteriogram was performed. The vessels are significantly attenuated consistent with diffuse vaso spasm. Branches of the short gastric arteries are identified in the region of the previously placed endoscopic clip. Using a glidewire, the 5 [place] catheter was successfully advanced into the splenic artery. Angiography was then performed in multiple obliquities. The splenic artery divides into a superior and inferior division. Both divisions give rise to some a short gastric arteries. A Renegade high flow microcatheter was successfully advanced into the distal splenic artery and then into the superior division. Superior division short gastric arteries appear to provide blood flow more superiorly along the gastric fundus. The microcatheter was then successfully navigated into the inferior division of the splenic artery. The short gastric arteries are in close proximity to the endovascular clip. No definite active extravasation was identified. The decision was made to perform coil embolization. This will likely devascularize approximately 30-40% of the lower pole of the spleen. Coil embolization was performed using a  combination of 2 and 3 mm interlock and vortex coils. Post embolization arteriography demonstrates complete cessation of flow in the arteries in the region of the previously placed endovascular clip. The catheters were removed. Hemostasis was attained with the assistance of an ExoSeal extra arterial vascular plug. IMPRESSION: 1. Successful prior coil embolization of the left gastric artery. 2. Coil embolization of the inferior division of the splenic artery and  is associated short gastric arteries in the region of the previously placed endovascular clip. Angiography and embolization therapy has now been maximized. If the patient continues to hemorrhage, repeat endoscopy or surgery would likely be required. Signed, Criselda Peaches, MD [facility] Radiology Electronically Signed   By: Jacqulynn Cadet M.D.   On: 04/13/2017 19:09   Ir Fluoro Guide Cv Line Right  Result Date: 04/13/2017 INDICATION: 62 year old female with gastric ulcer and a visible bleeding vessel. Patient underwent embolization yesterday but continues to demonstrate evidence of upper GI bleeding. Repeat angiography is requested. EXAM: IR EMBO ART VEN HEMORR LYMPH EXTRAV INC GUIDE ROADMAPPING; ARTERIOGRAPHY; IR ULTRASOUND GUIDANCE VASC ACCESS RIGHT; SELECTIVE VISCERAL ARTERIOGRAPHY; ADDITIONAL ARTERIOGRAPHY; IR RIGHT FLOURO GUIDE CV LINE 1. Ultrasound-guided access right common femoral artery 2. Catheterization and angiogram of the left gastric artery 3. Catheterization and angiogram the celiac axis 4. Catheterization and angiogram of the splenic artery 5. Catheterization and angiogram of the superior division of the splenic artery 6. Catheterization and angiogram of the inferior division of the splenic artery 7. Coil embolization of the inferior division of the splenic artery MEDICATIONS: None ANESTHESIA/SEDATION: Moderate (conscious) sedation was employed during this procedure. A total of  Versed 1 mg was administered intravenously. The patient was on a fentanyl drip. Moderate Sedation Time: 60 minutes. The patient's level of consciousness and vital signs were monitored continuously by radiology nursing throughout the procedure under my direct supervision. CONTRAST:  80 mL Isovue 300 FLUOROSCOPY TIME:  Fluoroscopy Time: 13 minutes 6 seconds (1299 mGy). COMPLICATIONS: None immediate. PROCEDURE: Informed consent was obtained from the patient following explanation of the procedure, risks, benefits and alternatives. The patient understands, agrees and consents for the procedure. All questions were addressed. A time out was performed prior to the initiation of the procedure. Maximal barrier sterile technique utilized including caps, mask, sterile gowns, sterile gloves, large sterile drape, hand hygiene, and Betadine prep. The right common femoral artery was interrogated with ultrasound and found to be widely patent. An image was obtained and stored for the medical record. Local anesthesia was attained by infiltration with 1% lidocaine. A small dermatotomy was made. Under real-time sonographic guidance, the vessel was punctured with a 21 gauge micropuncture needle. Using standard technique, the initial micro needle was exchanged over a 0.018 micro wire for a transitional 4 [place] micro sheath. The micro sheath was then exchanged over a 0.035 wire for a 5 French vascular sheath. A C2 cobra catheter was advanced over a Bentson wire into the abdominal aorta. The catheter was used to select the left gastric artery which arises directly from the aorta. Catheter angiography was performed. Successful coil embolization of the left gastric artery on the prior angiogram. There is no significant perfusion in the region of the vascular clip. The C2 cobra catheter was then advanced into the celiac artery and a celiac arteriogram was performed. The vessels are significantly attenuated consistent with diffuse vaso spasm.  Branches of the short gastric arteries are identified in the region of the previously placed endoscopic clip. Using a glidewire, the 5 [place] catheter was successfully advanced into the splenic artery. Angiography was then performed in multiple obliquities. The splenic artery divides into a superior and inferior division. Both divisions give rise to some a short gastric arteries. A Renegade high flow microcatheter was successfully advanced into the distal splenic artery and then into the superior division. Superior division short gastric arteries appear to provide blood flow more superiorly along the  gastric fundus. The microcatheter was then successfully navigated into the inferior division of the splenic artery. The short gastric arteries are in close proximity to the endovascular clip. No definite active extravasation was identified. The decision was made to perform coil embolization. This will likely devascularize approximately 30-40% of the lower pole of the spleen. Coil embolization was performed using a combination of 2 and 3 mm interlock and vortex coils. Post embolization arteriography demonstrates complete cessation of flow in the arteries in the region of the previously placed endovascular clip. The catheters were removed. Hemostasis was attained with the assistance of an ExoSeal extra arterial vascular plug. IMPRESSION: 1. Successful prior coil embolization of the left gastric artery. 2. Coil embolization of the inferior division of the splenic artery and is associated short gastric arteries in the region of the previously placed endovascular clip. Angiography and embolization therapy has now been maximized. If the patient continues to hemorrhage, repeat endoscopy or surgery would likely be required. Signed, Criselda Peaches, MD [facility] Radiology Electronically Signed   By: Jacqulynn Cadet M.D.   On: 04/13/2017 19:09   Ir US Guide Vasc Access  Right  Result Date: 04/13/2017 INDICATION: 62 year old female with gastric ulcer and a visible bleeding vessel. Patient underwent embolization yesterday but continues to demonstrate evidence of upper GI bleeding. Repeat angiography is requested. EXAM: IR EMBO ART VEN HEMORR LYMPH EXTRAV INC GUIDE ROADMAPPING; ARTERIOGRAPHY; IR ULTRASOUND GUIDANCE VASC ACCESS RIGHT; SELECTIVE VISCERAL ARTERIOGRAPHY; ADDITIONAL ARTERIOGRAPHY; IR RIGHT FLOURO GUIDE CV LINE 1. Ultrasound-guided access right common femoral artery 2. Catheterization and angiogram of the left gastric artery 3. Catheterization and angiogram the celiac axis 4. Catheterization and angiogram of the splenic artery 5. Catheterization and angiogram of the superior division of the splenic artery 6. Catheterization and angiogram of the inferior division of the splenic artery 7. Coil embolization of the inferior division of the splenic artery MEDICATIONS: None ANESTHESIA/SEDATION: Moderate (conscious) sedation was employed during this procedure. A total of Versed 1 mg was administered intravenously. The patient was on a fentanyl drip. Moderate Sedation Time: 60 minutes. The patient's level of consciousness and vital signs were monitored continuously by radiology nursing throughout the procedure under my direct supervision. CONTRAST:  80 mL Isovue 300 FLUOROSCOPY TIME:  Fluoroscopy Time: 13 minutes 6 seconds (1299 mGy). COMPLICATIONS: None immediate. PROCEDURE: Informed consent was obtained from the patient following explanation of the procedure, risks, benefits and alternatives. The patient understands, agrees and consents for the procedure. All questions were addressed. A time out was performed prior to the initiation of the procedure. Maximal barrier sterile technique utilized including caps, mask, sterile gowns, sterile gloves, large sterile drape, hand hygiene, and Betadine prep. The right common femoral artery was interrogated with ultrasound and found to be  widely patent. An image was obtained and stored for the medical record. Local anesthesia was attained by infiltration with 1% lidocaine. A small dermatotomy was made. Under real-time sonographic guidance, the vessel was punctured with a 21 gauge micropuncture needle. Using standard technique, the initial micro needle was exchanged over a 0.018 micro wire for a transitional 4 [place] micro sheath. The micro sheath was then exchanged over a 0.035 wire for a 5 French vascular sheath. A C2 cobra catheter was advanced over a Bentson wire into the abdominal aorta. The catheter was used to select the left gastric artery which arises directly from the aorta. Catheter angiography was performed. Successful coil embolization of the left gastric artery on the prior  angiogram. There is no significant perfusion in the region of the vascular clip. The C2 cobra catheter was then advanced into the celiac artery and a celiac arteriogram was performed. The vessels are significantly attenuated consistent with diffuse vaso spasm. Branches of the short gastric arteries are identified in the region of the previously placed endoscopic clip. Using a glidewire, the 5 [place] catheter was successfully advanced into the splenic artery. Angiography was then performed in multiple obliquities. The splenic artery divides into a superior and inferior division. Both divisions give rise to some a short gastric arteries. A Renegade high flow microcatheter was successfully advanced into the distal splenic artery and then into the superior division. Superior division short gastric arteries appear to provide blood flow more superiorly along the gastric fundus. The microcatheter was then successfully navigated into the inferior division of the splenic artery. The short gastric arteries are in close proximity to the endovascular clip. No definite active extravasation was identified. The decision was made to perform coil embolization. This will likely  devascularize approximately 30-40% of the lower pole of the spleen. Coil embolization was performed using a combination of 2 and 3 mm interlock and vortex coils. Post embolization arteriography demonstrates complete cessation of flow in the arteries in the region of the previously placed endovascular clip. The catheters were removed. Hemostasis was attained with the assistance of an ExoSeal extra arterial vascular plug. IMPRESSION: 1. Successful prior coil embolization of the left gastric artery. 2. Coil embolization of the inferior division of the splenic artery and is associated short gastric arteries in the region of the previously placed endovascular clip. Angiography and embolization therapy has now been maximized. If the patient continues to hemorrhage, repeat endoscopy or surgery would likely be required. Signed, Criselda Peaches, MD [facility] Radiology Electronically Signed   By: Jacqulynn Cadet M.D.   On: 04/13/2017 19:09   Ir US Guide Vasc Access Right  Result Date: 04/13/2017 INDICATION: 62 year old female with gastric ulcer and a visible bleeding vessel. Patient underwent embolization yesterday but continues to demonstrate evidence of upper GI bleeding. Repeat angiography is requested. EXAM: IR EMBO ART VEN HEMORR LYMPH EXTRAV INC GUIDE ROADMAPPING; ARTERIOGRAPHY; IR ULTRASOUND GUIDANCE VASC ACCESS RIGHT; SELECTIVE VISCERAL ARTERIOGRAPHY; ADDITIONAL ARTERIOGRAPHY; IR RIGHT FLOURO GUIDE CV LINE 1. Ultrasound-guided access right common femoral artery 2. Catheterization and angiogram of the left gastric artery 3. Catheterization and angiogram the celiac axis 4. Catheterization and angiogram of the splenic artery 5. Catheterization and angiogram of the superior division of the splenic artery 6. Catheterization and angiogram of the inferior division of the splenic artery 7. Coil embolization of the inferior division of the splenic artery MEDICATIONS:  None ANESTHESIA/SEDATION: Moderate (conscious) sedation was employed during this procedure. A total of Versed 1 mg was administered intravenously. The patient was on a fentanyl drip. Moderate Sedation Time: 60 minutes. The patient's level of consciousness and vital signs were monitored continuously by radiology nursing throughout the procedure under my direct supervision. CONTRAST:  80 mL Isovue 300 FLUOROSCOPY TIME:  Fluoroscopy Time: 13 minutes 6 seconds (1299 mGy). COMPLICATIONS: None immediate. PROCEDURE: Informed consent was obtained from the patient following explanation of the procedure, risks, benefits and alternatives. The patient understands, agrees and consents for the procedure. All questions were addressed. A time out was performed prior to the initiation of the procedure. Maximal barrier sterile technique utilized including caps, mask, sterile gowns, sterile gloves, large sterile drape, hand hygiene, and Betadine prep. The right common femoral  artery was interrogated with ultrasound and found to be widely patent. An image was obtained and stored for the medical record. Local anesthesia was attained by infiltration with 1% lidocaine. A small dermatotomy was made. Under real-time sonographic guidance, the vessel was punctured with a 21 gauge micropuncture needle. Using standard technique, the initial micro needle was exchanged over a 0.018 micro wire for a transitional 4 [place] micro sheath. The micro sheath was then exchanged over a 0.035 wire for a 5 French vascular sheath. A C2 cobra catheter was advanced over a Bentson wire into the abdominal aorta. The catheter was used to select the left gastric artery which arises directly from the aorta. Catheter angiography was performed. Successful coil embolization of the left gastric artery on the prior angiogram. There is no significant perfusion in the region of the vascular clip. The C2 cobra catheter was then advanced into the celiac artery and a celiac  arteriogram was performed. The vessels are significantly attenuated consistent with diffuse vaso spasm. Branches of the short gastric arteries are identified in the region of the previously placed endoscopic clip. Using a glidewire, the 5 [place] catheter was successfully advanced into the splenic artery. Angiography was then performed in multiple obliquities. The splenic artery divides into a superior and inferior division. Both divisions give rise to some a short gastric arteries. A Renegade high flow microcatheter was successfully advanced into the distal splenic artery and then into the superior division. Superior division short gastric arteries appear to provide blood flow more superiorly along the gastric fundus. The microcatheter was then successfully navigated into the inferior division of the splenic artery. The short gastric arteries are in close proximity to the endovascular clip. No definite active extravasation was identified. The decision was made to perform coil embolization. This will likely devascularize approximately 30-40% of the lower pole of the spleen. Coil embolization was performed using a combination of 2 and 3 mm interlock and vortex coils. Post embolization arteriography demonstrates complete cessation of flow in the arteries in the region of the previously placed endovascular clip. The catheters were removed. Hemostasis was attained with the assistance of an ExoSeal extra arterial vascular plug. IMPRESSION: 1. Successful prior coil embolization of the left gastric artery. 2. Coil embolization of the inferior division of the splenic artery and is associated short gastric arteries in the region of the previously placed endovascular clip. Angiography and embolization therapy has now been maximized. If the patient continues to hemorrhage, repeat endoscopy or surgery would likely be required. Signed, Criselda Peaches, MD [facility] Radiology  Electronically Signed   By: Jacqulynn Cadet M.D.   On: 04/13/2017 19:09   Dg Chest Port 1 View  Result Date: 04/14/2017 CLINICAL DATA:  Respiratory failure EXAM: PORTABLE CHEST 1 VIEW COMPARISON:  04/13/2017 FINDINGS: Cardiac shadow is within normal limits. Endotracheal tube and nasogastric catheter are again noted in satisfactory position. Previously seen left jugular line is been removed and a new right jugular line placed in satisfactory position. Bilateral pleural effusions right greater than left are noted with associated atelectatic changes. IMPRESSION: Increasing bilateral pleural effusions and atelectasis. Electronically Signed   By: Inez Catalina M.D.   On: 04/14/2017 06:59   Dg Abd Portable 1v  Result Date: 04/13/2017 CLINICAL DATA:  62 year old female with nasogastric tube placement. Subsequent encounter. EXAM: PORTABLE ABDOMEN - 1 VIEW COMPARISON:  04/13/2017 CT FINDINGS: Nasogastric tube tip pylorus level with side hole body/antrum junction. Residual contrast within dilated left renal collecting  system. Abnormal bowel gas pattern.  Please see CT report. IMPRESSION: Nasogastric tube tip gastric pylorus level with side hole gastric body/antrum junction. Electronically Signed   By: Genia Del M.D.   On: 04/13/2017 15:36   [place] Guide Roadmapping  Result Date: 04/13/2017 INDICATION: 62 year old female with gastric ulcer and a visible bleeding vessel. Patient underwent embolization yesterday but continues to demonstrate evidence of upper GI bleeding. Repeat angiography is requested. EXAM: IR EMBO ART VEN HEMORR LYMPH EXTRAV INC GUIDE ROADMAPPING; ARTERIOGRAPHY; IR ULTRASOUND GUIDANCE VASC ACCESS RIGHT; SELECTIVE VISCERAL ARTERIOGRAPHY; ADDITIONAL ARTERIOGRAPHY; IR RIGHT FLOURO GUIDE CV LINE 1. Ultrasound-guided access right common femoral artery 2. Catheterization and angiogram of the left gastric artery 3. Catheterization and angiogram the celiac axis 4.  Catheterization and angiogram of the splenic artery 5. Catheterization and angiogram of the superior division of the splenic artery 6. Catheterization and angiogram of the inferior division of the splenic artery 7. Coil embolization of the inferior division of the splenic artery MEDICATIONS: None ANESTHESIA/SEDATION: Moderate (conscious) sedation was employed during this procedure. A total of Versed 1 mg was administered intravenously. The patient was on a fentanyl drip. Moderate Sedation Time: 60 minutes. The patient's level of consciousness and vital signs were monitored continuously by radiology nursing throughout the procedure under my direct supervision. CONTRAST:  80 mL Isovue 300 FLUOROSCOPY TIME:  Fluoroscopy Time: 13 minutes 6 seconds (1299 mGy). COMPLICATIONS: None immediate. PROCEDURE: Informed consent was obtained from the patient following explanation of the procedure, risks, benefits and alternatives. The patient understands, agrees and consents for the procedure. All questions were addressed. A time out was performed prior to the initiation of the procedure. Maximal barrier sterile technique utilized including caps, mask, sterile gowns, sterile gloves, large sterile drape, hand hygiene, and Betadine prep. The right common femoral artery was interrogated with ultrasound and found to be widely patent. An image was obtained and stored for the medical record. Local anesthesia was attained by infiltration with 1% lidocaine. A small dermatotomy was made. Under real-time sonographic guidance, the vessel was punctured with a 21 gauge micropuncture needle. Using standard technique, the initial micro needle was exchanged over a 0.018 micro wire for a transitional 4 [place] micro sheath. The micro sheath was then exchanged over a 0.035 wire for a 5 French vascular sheath. A C2 cobra catheter was advanced over a Bentson wire into the abdominal aorta. The catheter was used to select the left gastric artery which  arises directly from the aorta. Catheter angiography was performed. Successful coil embolization of the left gastric artery on the prior angiogram. There is no significant perfusion in the region of the vascular clip. The C2 cobra catheter was then advanced into the celiac artery and a celiac arteriogram was performed. The vessels are significantly attenuated consistent with diffuse vaso spasm. Branches of the short gastric arteries are identified in the region of the previously placed endoscopic clip. Using a glidewire, the 5 [place] catheter was successfully advanced into the splenic artery. Angiography was then performed in multiple obliquities. The splenic artery divides into a superior and inferior division. Both divisions give rise to some a short gastric arteries. A Renegade high flow microcatheter was successfully advanced into the distal splenic artery and then into the superior division. Superior division short gastric arteries appear to provide blood flow more superiorly along the gastric fundus. The microcatheter was then successfully navigated into the inferior division of the splenic artery. The short gastric  arteries are in close proximity to the endovascular clip. No definite active extravasation was identified. The decision was made to perform coil embolization. This will likely devascularize approximately 30-40% of the lower pole of the spleen. Coil embolization was performed using a combination of 2 and 3 mm interlock and vortex coils. Post embolization arteriography demonstrates complete cessation of flow in the arteries in the region of the previously placed endovascular clip. The catheters were removed. Hemostasis was attained with the assistance of an ExoSeal extra arterial vascular plug. IMPRESSION: 1. Successful prior coil embolization of the left gastric artery. 2. Coil embolization of the inferior division of the splenic artery and is associated short gastric arteries in the region of the  previously placed endovascular clip. Angiography and embolization therapy has now been maximized. If the patient continues to hemorrhage, repeat endoscopy or surgery would likely be required. Signed, Criselda Peaches, MD [facility] Radiology Electronically Signed   By: Jacqulynn Cadet M.D.   On: 04/13/2017 19:09    Medications:  Scheduled: . chlorhexidine gluconate (MEDLINE KIT)  15 mL Mouth Rinse BID  . Chlorhexidine Gluconate Cloth  6 each Topical Daily  . Gerhardt's butt cream   Topical TID  . insulin aspart  2-6 Units Subcutaneous Q4H  . mouth rinse  15 mL Mouth Rinse QID  . pantoprazole  40 mg Intravenous Q12H  . sodium chloride flush  10-40 mL Intracatheter Q12H  . sodium chloride flush  3 mL Intravenous Q12H   Continuous: . sodium chloride    . sodium chloride    . sodium chloride 75 mL/hr at 04/14/17 1115  . fentaNYL infusion INTRAVENOUS 125 mcg/hr (04/14/17 1955)  . norepinephrine (LEVOPHED) Adult infusion Stopped (04/14/17 1200)  . potassium chloride Stopped (04/12/17 2246)  . vasopressin (PITRESSIN) infusion - *FOR SHOCK* Stopped (04/14/17 1145)    Assessment/Plan: 1) Upper GI bleed - stable. 2) Anemia. 3) Respiratory failure.   She is improving.  Currently she is off of all pressors and her blood pressure has normalized.  Her HGB mildly dropped, but there was no overt evidence of any bleeding.  Nursing reports that the OG tube was negative for any blood aspirate.  Per CCM, hopefully she can be weaned off of the vent soon.  Plan: 1) Continue to follow HGB. 2) Transfuse as necessary. 3) Again, I am willing to repeat the EGD, if necessary for any concern for a persistent GI bleed.  LOS: 13 days   Camera Krienke D 04/15/2017, 7:49 AM

## 2017-04-15 NOTE — Progress Notes (Signed)
3 Days Post-Op   Subjective/Chief Complaint: On vent    Objective: Vital signs in last 24 hours: Temp:  [97.6 F (36.4 C)-98.8 F (37.1 C)] 98.5 F (36.9 C) (02/06 0712) Pulse Rate:  [47-99] 67 (02/06 0741) Resp:  [7-22] 22 (02/06 0741) BP: (93-142)/(31-82) 129/45 (02/06 0741) SpO2:  [92 %-100 %] 100 % (02/06 0741) Arterial Line BP: (99)/(45) 99/45 (02/05 0900) FiO2 (%):  [30 %-60 %] 40 % (02/06 0741) Weight:  [88.6 kg (195 lb 5.2 oz)] 88.6 kg (195 lb 5.2 oz) (02/06 0500) Last BM Date: 04/14/17  Intake/Output from previous day: 02/05 0701 - 02/06 0700 In: 2326.2 [I.V.:2011.2; Blood:315] Out: 0354 [Urine:1315] Intake/Output this shift: No intake/output data recorded.  GI: soft NT   Lab Results:  Recent Labs    04/14/17 0338  04/15/17 0033 04/15/17 0507  WBC 22.6*  --   --  12.3*  HGB 8.9*   < > 7.0* 8.5*  HCT 26.0*   < > 21.5* 26.3*  PLT 72*  --   --  54*   < > = values in this interval not displayed.   BMET Recent Labs    04/14/17 0044 04/15/17 0507  NA 146* 146*  K 3.8 3.6  CL 103 105  CO2 31 29  GLUCOSE 131* 92  BUN 16 18  CREATININE 1.59* 1.60*  CALCIUM 7.2* 7.5*   PT/INR Recent Labs    04/13/17 0926 04/14/17 0920  LABPROT 22.3* 15.8*  INR 1.97 1.27   ABG Recent Labs    04/13/17 0914 04/14/17 0404  PHART 7.460* 7.467*  HCO3 30.9* 33.9*    Studies/Results: Ct Abdomen Pelvis Wo Contrast  Result Date: 04/13/2017 CLINICAL DATA:  Evaluate for retroperitoneal hematoma status post arterial access. EXAM: CT ABDOMEN AND PELVIS WITHOUT CONTRAST TECHNIQUE: Multidetector CT imaging of the abdomen and pelvis was performed following the standard protocol without IV contrast. COMPARISON:  04/03/2017 FINDINGS: Lower chest: Moderate bilateral pleural effusions with overlying compressive type atelectasis. Hepatobiliary: No focal liver abnormality. Vicarious excretion of contrast material into the gallbladder is noted. Pancreas: Unremarkable. No pancreatic  ductal dilatation or surrounding inflammatory changes. Spleen: Normal in size without focal abnormality. Adrenals/Urinary Tract: Normal adrenal glands. There is a striated nephrographic appearance of both kidneys, right greater than left. IV contrast material is identified within a dilated left renal collecting system and ureter. Asymmetrically diminished contrast within the right renal collecting system identified. Urinary bladder collapsed around a Foley catheter. Stomach/Bowel: There is a gastrostomy tube with tip in the distal stomach. Moderate to increased attenuation material is identified within the gastric lumen, image 17 of series 3. There also high attenuation material is identified within the small and large bowel loops. Vascular/Lymphatic: Aortic atherosclerosis. No aneurysm. Filter identified within the IVC. There is no adenopathy. Reproductive: Status post hysterectomy. No adnexal masses. Other: There is a small volume of ascites identified. Small to moderate volume of ascites within the right upper quadrant of the abdomen and right lower quadrant of the abdomen and pelvis. There is diffuse body wall edema compatible with anasarca. No retroperitoneal hematoma identified. Musculoskeletal: No acute or significant osseous findings. IMPRESSION: 1. No evidence for retroperitoneal hematoma. 2. Striated nephrographic appearance of both kidneys identified, right greater than left. Imaging findings are favored to represent sequelae of acute tubular necrosis. Not excluded but favored less likely would be an embolic phenomenon resulting in areas of renal infarct. 3. Left-sided hydronephrosis and hydroureter. Etiology indeterminate. Cannot rule out distal ureteral obstruction. 4. Bilateral pleural  effusions, ascites and diffuse body wall edema compatible with fluid overload. 5. Intermediate to high attenuation material within the stomach. Although conceivably this could be related to it administration of enteric  contrast material ongoing bleeding within the gastric lumen is not excluded. 6.  Aortic Atherosclerosis (ICD10-I70.0). Electronically Signed   By: Kerby Moors M.D.   On: 04/13/2017 12:00   Ir Angiogram Visceral Selective  Result Date: 04/13/2017 INDICATION: 62 year old female with gastric ulcer and a visible bleeding vessel. Patient underwent embolization yesterday but continues to demonstrate evidence of upper GI bleeding. Repeat angiography is requested. EXAM: IR EMBO ART VEN HEMORR LYMPH EXTRAV INC GUIDE ROADMAPPING; ARTERIOGRAPHY; IR ULTRASOUND GUIDANCE VASC ACCESS RIGHT; SELECTIVE VISCERAL ARTERIOGRAPHY; ADDITIONAL ARTERIOGRAPHY; IR RIGHT FLOURO GUIDE CV LINE 1. Ultrasound-guided access right common femoral artery 2. Catheterization and angiogram of the left gastric artery 3. Catheterization and angiogram the celiac axis 4. Catheterization and angiogram of the splenic artery 5. Catheterization and angiogram of the superior division of the splenic artery 6. Catheterization and angiogram of the inferior division of the splenic artery 7. Coil embolization of the inferior division of the splenic artery MEDICATIONS: None ANESTHESIA/SEDATION: Moderate (conscious) sedation was employed during this procedure. A total of Versed 1 mg was administered intravenously. The patient was on a fentanyl drip. Moderate Sedation Time: 60 minutes. The patient's level of consciousness and vital signs were monitored continuously by radiology nursing throughout the procedure under my direct supervision. CONTRAST:  80 mL Isovue 300 FLUOROSCOPY TIME:  Fluoroscopy Time: 13 minutes 6 seconds (1299 mGy). COMPLICATIONS: None immediate. PROCEDURE: Informed consent was obtained from the patient following explanation of the procedure, risks, benefits and alternatives. The patient understands, agrees and consents for the procedure. All questions were addressed. A time out was performed prior to the initiation of the procedure. Maximal  barrier sterile technique utilized including caps, mask, sterile gowns, sterile gloves, large sterile drape, hand hygiene, and Betadine prep. The right common femoral artery was interrogated with ultrasound and found to be widely patent. An image was obtained and stored for the medical record. Local anesthesia was attained by infiltration with 1% lidocaine. A small dermatotomy was made. Under real-time sonographic guidance, the vessel was punctured with a 21 gauge micropuncture needle. Using standard technique, the initial micro needle was exchanged over a 0.018 micro wire for a transitional 4 [place] micro sheath. The micro sheath was then exchanged over a 0.035 wire for a 5 French vascular sheath. A C2 cobra catheter was advanced over a Bentson wire into the abdominal aorta. The catheter was used to select the left gastric artery which arises directly from the aorta. Catheter angiography was performed. Successful coil embolization of the left gastric artery on the prior angiogram. There is no significant perfusion in the region of the vascular clip. The C2 cobra catheter was then advanced into the celiac artery and a celiac arteriogram was performed. The vessels are significantly attenuated consistent with diffuse vaso spasm. Branches of the short gastric arteries are identified in the region of the previously placed endoscopic clip. Using a glidewire, the 5 [place] catheter was successfully advanced into the splenic artery. Angiography was then performed in multiple obliquities. The splenic artery divides into a superior and inferior division. Both divisions give rise to some a short gastric arteries. A Renegade high flow microcatheter was successfully advanced into the distal splenic artery and then into the superior division. Superior division short gastric arteries appear to provide blood flow more superiorly along the gastric fundus. The  microcatheter was then successfully navigated into the inferior division  of the splenic artery. The short gastric arteries are in close proximity to the endovascular clip. No definite active extravasation was identified. The decision was made to perform coil embolization. This will likely devascularize approximately 30-40% of the lower pole of the spleen. Coil embolization was performed using a combination of 2 and 3 mm interlock and vortex coils. Post embolization arteriography demonstrates complete cessation of flow in the arteries in the region of the previously placed endovascular clip. The catheters were removed. Hemostasis was attained with the assistance of an ExoSeal extra arterial vascular plug. IMPRESSION: 1. Successful prior coil embolization of the left gastric artery. 2. Coil embolization of the inferior division of the splenic artery and is associated short gastric arteries in the region of the previously placed endovascular clip. Angiography and embolization therapy has now been maximized. If the patient continues to hemorrhage, repeat endoscopy or surgery would likely be required. Signed, Criselda Peaches, MD [facility] Radiology Electronically Signed   By: Jacqulynn Cadet M.D.   On: 04/13/2017 19:09   Ir Angiogram Selective Each Additional Vessel  Result Date: 04/14/2017 INDICATION: 62 year old female with gastric ulcer and a visible bleeding vessel. Patient underwent embolization yesterday but continues to demonstrate evidence of upper GI bleeding. Repeat angiography is requested. EXAM: IR EMBO ART VEN HEMORR LYMPH EXTRAV INC GUIDE ROADMAPPING; ARTERIOGRAPHY; IR ULTRASOUND GUIDANCE VASC ACCESS RIGHT; SELECTIVE VISCERAL ARTERIOGRAPHY; ADDITIONAL ARTERIOGRAPHY; IR RIGHT FLOURO GUIDE CV LINE 1. Ultrasound-guided access right common femoral artery 2. Catheterization and angiogram of the left gastric artery 3. Catheterization and angiogram the celiac axis 4. Catheterization and angiogram of the splenic artery 5.  Catheterization and angiogram of the superior division of the splenic artery 6. Catheterization and angiogram of the inferior division of the splenic artery 7. Coil embolization of the inferior division of the splenic artery MEDICATIONS: None ANESTHESIA/SEDATION: Moderate (conscious) sedation was employed during this procedure. A total of Versed 1 mg was administered intravenously. The patient was on a fentanyl drip. Moderate Sedation Time: 60 minutes. The patient's level of consciousness and vital signs were monitored continuously by radiology nursing throughout the procedure under my direct supervision. CONTRAST:  80 mL Isovue 300 FLUOROSCOPY TIME:  Fluoroscopy Time: 13 minutes 6 seconds (1299 mGy). COMPLICATIONS: None immediate. PROCEDURE: Informed consent was obtained from the patient following explanation of the procedure, risks, benefits and alternatives. The patient understands, agrees and consents for the procedure. All questions were addressed. A time out was performed prior to the initiation of the procedure. Maximal barrier sterile technique utilized including caps, mask, sterile gowns, sterile gloves, large sterile drape, hand hygiene, and Betadine prep. The right common femoral artery was interrogated with ultrasound and found to be widely patent. An image was obtained and stored for the medical record. Local anesthesia was attained by infiltration with 1% lidocaine. A small dermatotomy was made. Under real-time sonographic guidance, the vessel was punctured with a 21 gauge micropuncture needle. Using standard technique, the initial micro needle was exchanged over a 0.018 micro wire for a transitional 4 [place] micro sheath. The micro sheath was then exchanged over a 0.035 wire for a 5 French vascular sheath. A C2 cobra catheter was advanced over a Bentson wire into the abdominal aorta. The catheter was used to select the left gastric artery which arises directly from the aorta. Catheter angiography was  performed. Successful coil embolization of the left gastric artery on the prior angiogram. There  is no significant perfusion in the region of the vascular clip. The C2 cobra catheter was then advanced into the celiac artery and a celiac arteriogram was performed. The vessels are significantly attenuated consistent with diffuse vaso spasm. Branches of the short gastric arteries are identified in the region of the previously placed endoscopic clip. Using a glidewire, the 5 [place] catheter was successfully advanced into the splenic artery. Angiography was then performed in multiple obliquities. The splenic artery divides into a superior and inferior division. Both divisions give rise to some a short gastric arteries. A Renegade high flow microcatheter was successfully advanced into the distal splenic artery and then into the superior division. Superior division short gastric arteries appear to provide blood flow more superiorly along the gastric fundus. The microcatheter was then successfully navigated into the inferior division of the splenic artery. The short gastric arteries are in close proximity to the endovascular clip. No definite active extravasation was identified. The decision was made to perform coil embolization. This will likely devascularize approximately 30-40% of the lower pole of the spleen. Coil embolization was performed using a combination of 2 and 3 mm interlock and vortex coils. Post embolization arteriography demonstrates complete cessation of flow in the arteries in the region of the previously placed endovascular clip. The catheters were removed. Hemostasis was attained with the assistance of an ExoSeal extra arterial vascular plug. IMPRESSION: 1. Successful prior coil embolization of the left gastric artery. 2. Coil embolization of the inferior division of the splenic artery and is associated short gastric arteries in the region of the previously placed endovascular clip. Angiography and  embolization therapy has now been maximized. If the patient continues to hemorrhage, repeat endoscopy or surgery would likely be required. Signed, Criselda Peaches, MD [facility] Radiology Electronically Signed   By: Jacqulynn Cadet M.D.   On: 04/13/2017 19:09   Ir Angiogram Selective Each Additional Vessel  Result Date: 04/13/2017 INDICATION: 62 year old female with gastric ulcer and a visible bleeding vessel. Patient underwent embolization yesterday but continues to demonstrate evidence of upper GI bleeding. Repeat angiography is requested. EXAM: IR EMBO ART VEN HEMORR LYMPH EXTRAV INC GUIDE ROADMAPPING; ARTERIOGRAPHY; IR ULTRASOUND GUIDANCE VASC ACCESS RIGHT; SELECTIVE VISCERAL ARTERIOGRAPHY; ADDITIONAL ARTERIOGRAPHY; IR RIGHT FLOURO GUIDE CV LINE 1. Ultrasound-guided access right common femoral artery 2. Catheterization and angiogram of the left gastric artery 3. Catheterization and angiogram the celiac axis 4. Catheterization and angiogram of the splenic artery 5. Catheterization and angiogram of the superior division of the splenic artery 6. Catheterization and angiogram of the inferior division of the splenic artery 7. Coil embolization of the inferior division of the splenic artery MEDICATIONS: None ANESTHESIA/SEDATION: Moderate (conscious) sedation was employed during this procedure. A total of Versed 1 mg was administered intravenously. The patient was on a fentanyl drip. Moderate Sedation Time: 60 minutes. The patient's level of consciousness and vital signs were monitored continuously by radiology nursing throughout the procedure under my direct supervision. CONTRAST:  80 mL Isovue 300 FLUOROSCOPY TIME:  Fluoroscopy Time: 13 minutes 6 seconds (1299 mGy). COMPLICATIONS: None immediate. PROCEDURE: Informed consent was obtained from the patient following explanation of the procedure, risks, benefits and alternatives. The patient understands, agrees  and consents for the procedure. All questions were addressed. A time out was performed prior to the initiation of the procedure. Maximal barrier sterile technique utilized including caps, mask, sterile gowns, sterile gloves, large sterile drape, hand hygiene, and Betadine prep. The right common femoral artery was  interrogated with ultrasound and found to be widely patent. An image was obtained and stored for the medical record. Local anesthesia was attained by infiltration with 1% lidocaine. A small dermatotomy was made. Under real-time sonographic guidance, the vessel was punctured with a 21 gauge micropuncture needle. Using standard technique, the initial micro needle was exchanged over a 0.018 micro wire for a transitional 4 [place] micro sheath. The micro sheath was then exchanged over a 0.035 wire for a 5 French vascular sheath. A C2 cobra catheter was advanced over a Bentson wire into the abdominal aorta. The catheter was used to select the left gastric artery which arises directly from the aorta. Catheter angiography was performed. Successful coil embolization of the left gastric artery on the prior angiogram. There is no significant perfusion in the region of the vascular clip. The C2 cobra catheter was then advanced into the celiac artery and a celiac arteriogram was performed. The vessels are significantly attenuated consistent with diffuse vaso spasm. Branches of the short gastric arteries are identified in the region of the previously placed endoscopic clip. Using a glidewire, the 5 [place] catheter was successfully advanced into the splenic artery. Angiography was then performed in multiple obliquities. The splenic artery divides into a superior and inferior division. Both divisions give rise to some a short gastric arteries. A Renegade high flow microcatheter was successfully advanced into the distal splenic artery and then into the superior division. Superior division short gastric arteries appear to  provide blood flow more superiorly along the gastric fundus. The microcatheter was then successfully navigated into the inferior division of the splenic artery. The short gastric arteries are in close proximity to the endovascular clip. No definite active extravasation was identified. The decision was made to perform coil embolization. This will likely devascularize approximately 30-40% of the lower pole of the spleen. Coil embolization was performed using a combination of 2 and 3 mm interlock and vortex coils. Post embolization arteriography demonstrates complete cessation of flow in the arteries in the region of the previously placed endovascular clip. The catheters were removed. Hemostasis was attained with the assistance of an ExoSeal extra arterial vascular plug. IMPRESSION: 1. Successful prior coil embolization of the left gastric artery. 2. Coil embolization of the inferior division of the splenic artery and is associated short gastric arteries in the region of the previously placed endovascular clip. Angiography and embolization therapy has now been maximized. If the patient continues to hemorrhage, repeat endoscopy or surgery would likely be required. Signed, Criselda Peaches, MD [facility] Radiology Electronically Signed   By: Jacqulynn Cadet M.D.   On: 04/13/2017 19:09   Ir Angiogram Selective Each Additional Vessel  Result Date: 04/13/2017 INDICATION: 62 year old female with gastric ulcer and a visible bleeding vessel. Patient underwent embolization yesterday but continues to demonstrate evidence of upper GI bleeding. Repeat angiography is requested. EXAM: IR EMBO ART VEN HEMORR LYMPH EXTRAV INC GUIDE ROADMAPPING; ARTERIOGRAPHY; IR ULTRASOUND GUIDANCE VASC ACCESS RIGHT; SELECTIVE VISCERAL ARTERIOGRAPHY; ADDITIONAL ARTERIOGRAPHY; IR RIGHT FLOURO GUIDE CV LINE 1. Ultrasound-guided access right common femoral artery 2. Catheterization and angiogram  of the left gastric artery 3. Catheterization and angiogram the celiac axis 4. Catheterization and angiogram of the splenic artery 5. Catheterization and angiogram of the superior division of the splenic artery 6. Catheterization and angiogram of the inferior division of the splenic artery 7. Coil embolization of the inferior division of the splenic artery MEDICATIONS: None ANESTHESIA/SEDATION: Moderate (conscious) sedation was employed during this  procedure. A total of Versed 1 mg was administered intravenously. The patient was on a fentanyl drip. Moderate Sedation Time: 60 minutes. The patient's level of consciousness and vital signs were monitored continuously by radiology nursing throughout the procedure under my direct supervision. CONTRAST:  80 mL Isovue 300 FLUOROSCOPY TIME:  Fluoroscopy Time: 13 minutes 6 seconds (1299 mGy). COMPLICATIONS: None immediate. PROCEDURE: Informed consent was obtained from the patient following explanation of the procedure, risks, benefits and alternatives. The patient understands, agrees and consents for the procedure. All questions were addressed. A time out was performed prior to the initiation of the procedure. Maximal barrier sterile technique utilized including caps, mask, sterile gowns, sterile gloves, large sterile drape, hand hygiene, and Betadine prep. The right common femoral artery was interrogated with ultrasound and found to be widely patent. An image was obtained and stored for the medical record. Local anesthesia was attained by infiltration with 1% lidocaine. A small dermatotomy was made. Under real-time sonographic guidance, the vessel was punctured with a 21 gauge micropuncture needle. Using standard technique, the initial micro needle was exchanged over a 0.018 micro wire for a transitional 4 [place] micro sheath. The micro sheath was then exchanged over a 0.035 wire for a 5 French vascular sheath. A C2 cobra catheter was advanced over a Bentson wire into the  abdominal aorta. The catheter was used to select the left gastric artery which arises directly from the aorta. Catheter angiography was performed. Successful coil embolization of the left gastric artery on the prior angiogram. There is no significant perfusion in the region of the vascular clip. The C2 cobra catheter was then advanced into the celiac artery and a celiac arteriogram was performed. The vessels are significantly attenuated consistent with diffuse vaso spasm. Branches of the short gastric arteries are identified in the region of the previously placed endoscopic clip. Using a glidewire, the 5 [place] catheter was successfully advanced into the splenic artery. Angiography was then performed in multiple obliquities. The splenic artery divides into a superior and inferior division. Both divisions give rise to some a short gastric arteries. A Renegade high flow microcatheter was successfully advanced into the distal splenic artery and then into the superior division. Superior division short gastric arteries appear to provide blood flow more superiorly along the gastric fundus. The microcatheter was then successfully navigated into the inferior division of the splenic artery. The short gastric arteries are in close proximity to the endovascular clip. No definite active extravasation was identified. The decision was made to perform coil embolization. This will likely devascularize approximately 30-40% of the lower pole of the spleen. Coil embolization was performed using a combination of 2 and 3 mm interlock and vortex coils. Post embolization arteriography demonstrates complete cessation of flow in the arteries in the region of the previously placed endovascular clip. The catheters were removed. Hemostasis was attained with the assistance of an ExoSeal extra arterial vascular plug. IMPRESSION: 1. Successful prior coil embolization of the left gastric artery. 2. Coil embolization of the inferior division of the  splenic artery and is associated short gastric arteries in the region of the previously placed endovascular clip. Angiography and embolization therapy has now been maximized. If the patient continues to hemorrhage, repeat endoscopy or surgery would likely be required. Signed, Criselda Peaches, MD [facility] Radiology Electronically Signed   By: Jacqulynn Cadet M.D.   On: 04/13/2017 19:09   Ir Fluoro Guide Cv Line Right  Result Date: 04/13/2017 INDICATION: 62 year old  female with gastric ulcer and a visible bleeding vessel. Patient underwent embolization yesterday but continues to demonstrate evidence of upper GI bleeding. Repeat angiography is requested. EXAM: IR EMBO ART VEN HEMORR LYMPH EXTRAV INC GUIDE ROADMAPPING; ARTERIOGRAPHY; IR ULTRASOUND GUIDANCE VASC ACCESS RIGHT; SELECTIVE VISCERAL ARTERIOGRAPHY; ADDITIONAL ARTERIOGRAPHY; IR RIGHT FLOURO GUIDE CV LINE 1. Ultrasound-guided access right common femoral artery 2. Catheterization and angiogram of the left gastric artery 3. Catheterization and angiogram the celiac axis 4. Catheterization and angiogram of the splenic artery 5. Catheterization and angiogram of the superior division of the splenic artery 6. Catheterization and angiogram of the inferior division of the splenic artery 7. Coil embolization of the inferior division of the splenic artery MEDICATIONS: None ANESTHESIA/SEDATION: Moderate (conscious) sedation was employed during this procedure. A total of Versed 1 mg was administered intravenously. The patient was on a fentanyl drip. Moderate Sedation Time: 60 minutes. The patient's level of consciousness and vital signs were monitored continuously by radiology nursing throughout the procedure under my direct supervision. CONTRAST:  80 mL Isovue 300 FLUOROSCOPY TIME:  Fluoroscopy Time: 13 minutes 6 seconds (1299 mGy). COMPLICATIONS: None immediate. PROCEDURE: Informed consent was obtained from the  patient following explanation of the procedure, risks, benefits and alternatives. The patient understands, agrees and consents for the procedure. All questions were addressed. A time out was performed prior to the initiation of the procedure. Maximal barrier sterile technique utilized including caps, mask, sterile gowns, sterile gloves, large sterile drape, hand hygiene, and Betadine prep. The right common femoral artery was interrogated with ultrasound and found to be widely patent. An image was obtained and stored for the medical record. Local anesthesia was attained by infiltration with 1% lidocaine. A small dermatotomy was made. Under real-time sonographic guidance, the vessel was punctured with a 21 gauge micropuncture needle. Using standard technique, the initial micro needle was exchanged over a 0.018 micro wire for a transitional 4 [place] micro sheath. The micro sheath was then exchanged over a 0.035 wire for a 5 French vascular sheath. A C2 cobra catheter was advanced over a Bentson wire into the abdominal aorta. The catheter was used to select the left gastric artery which arises directly from the aorta. Catheter angiography was performed. Successful coil embolization of the left gastric artery on the prior angiogram. There is no significant perfusion in the region of the vascular clip. The C2 cobra catheter was then advanced into the celiac artery and a celiac arteriogram was performed. The vessels are significantly attenuated consistent with diffuse vaso spasm. Branches of the short gastric arteries are identified in the region of the previously placed endoscopic clip. Using a glidewire, the 5 [place] catheter was successfully advanced into the splenic artery. Angiography was then performed in multiple obliquities. The splenic artery divides into a superior and inferior division. Both divisions give rise to some a short gastric arteries. A Renegade high flow microcatheter was successfully advanced into  the distal splenic artery and then into the superior division. Superior division short gastric arteries appear to provide blood flow more superiorly along the gastric fundus. The microcatheter was then successfully navigated into the inferior division of the splenic artery. The short gastric arteries are in close proximity to the endovascular clip. No definite active extravasation was identified. The decision was made to perform coil embolization. This will likely devascularize approximately 30-40% of the lower pole of the spleen. Coil embolization was performed using a combination of 2 and 3 mm interlock and vortex coils. Post embolization arteriography demonstrates complete  cessation of flow in the arteries in the region of the previously placed endovascular clip. The catheters were removed. Hemostasis was attained with the assistance of an ExoSeal extra arterial vascular plug. IMPRESSION: 1. Successful prior coil embolization of the left gastric artery. 2. Coil embolization of the inferior division of the splenic artery and is associated short gastric arteries in the region of the previously placed endovascular clip. Angiography and embolization therapy has now been maximized. If the patient continues to hemorrhage, repeat endoscopy or surgery would likely be required. Signed, Criselda Peaches, MD [facility] Radiology Electronically Signed   By: Jacqulynn Cadet M.D.   On: 04/13/2017 19:09   Ir US Guide Vasc Access Right  Result Date: 04/13/2017 INDICATION: 62 year old female with gastric ulcer and a visible bleeding vessel. Patient underwent embolization yesterday but continues to demonstrate evidence of upper GI bleeding. Repeat angiography is requested. EXAM: IR EMBO ART VEN HEMORR LYMPH EXTRAV INC GUIDE ROADMAPPING; ARTERIOGRAPHY; IR ULTRASOUND GUIDANCE VASC ACCESS RIGHT; SELECTIVE VISCERAL ARTERIOGRAPHY; ADDITIONAL ARTERIOGRAPHY; IR RIGHT FLOURO GUIDE  CV LINE 1. Ultrasound-guided access right common femoral artery 2. Catheterization and angiogram of the left gastric artery 3. Catheterization and angiogram the celiac axis 4. Catheterization and angiogram of the splenic artery 5. Catheterization and angiogram of the superior division of the splenic artery 6. Catheterization and angiogram of the inferior division of the splenic artery 7. Coil embolization of the inferior division of the splenic artery MEDICATIONS: None ANESTHESIA/SEDATION: Moderate (conscious) sedation was employed during this procedure. A total of Versed 1 mg was administered intravenously. The patient was on a fentanyl drip. Moderate Sedation Time: 60 minutes. The patient's level of consciousness and vital signs were monitored continuously by radiology nursing throughout the procedure under my direct supervision. CONTRAST:  80 mL Isovue 300 FLUOROSCOPY TIME:  Fluoroscopy Time: 13 minutes 6 seconds (1299 mGy). COMPLICATIONS: None immediate. PROCEDURE: Informed consent was obtained from the patient following explanation of the procedure, risks, benefits and alternatives. The patient understands, agrees and consents for the procedure. All questions were addressed. A time out was performed prior to the initiation of the procedure. Maximal barrier sterile technique utilized including caps, mask, sterile gowns, sterile gloves, large sterile drape, hand hygiene, and Betadine prep. The right common femoral artery was interrogated with ultrasound and found to be widely patent. An image was obtained and stored for the medical record. Local anesthesia was attained by infiltration with 1% lidocaine. A small dermatotomy was made. Under real-time sonographic guidance, the vessel was punctured with a 21 gauge micropuncture needle. Using standard technique, the initial micro needle was exchanged over a 0.018 micro wire for a transitional 4 [place] micro sheath. The micro sheath was then exchanged over a 0.035 wire  for a 5 French vascular sheath. A C2 cobra catheter was advanced over a Bentson wire into the abdominal aorta. The catheter was used to select the left gastric artery which arises directly from the aorta. Catheter angiography was performed. Successful coil embolization of the left gastric artery on the prior angiogram. There is no significant perfusion in the region of the vascular clip. The C2 cobra catheter was then advanced into the celiac artery and a celiac arteriogram was performed. The vessels are significantly attenuated consistent with diffuse vaso spasm. Branches of the short gastric arteries are identified in the region of the previously placed endoscopic clip. Using a glidewire, the 5 [place] catheter was successfully advanced into the splenic artery. Angiography was then performed in  multiple obliquities. The splenic artery divides into a superior and inferior division. Both divisions give rise to some a short gastric arteries. A Renegade high flow microcatheter was successfully advanced into the distal splenic artery and then into the superior division. Superior division short gastric arteries appear to provide blood flow more superiorly along the gastric fundus. The microcatheter was then successfully navigated into the inferior division of the splenic artery. The short gastric arteries are in close proximity to the endovascular clip. No definite active extravasation was identified. The decision was made to perform coil embolization. This will likely devascularize approximately 30-40% of the lower pole of the spleen. Coil embolization was performed using a combination of 2 and 3 mm interlock and vortex coils. Post embolization arteriography demonstrates complete cessation of flow in the arteries in the region of the previously placed endovascular clip. The catheters were removed. Hemostasis was attained with the assistance of an ExoSeal extra arterial vascular plug. IMPRESSION: 1. Successful prior coil  embolization of the left gastric artery. 2. Coil embolization of the inferior division of the splenic artery and is associated short gastric arteries in the region of the previously placed endovascular clip. Angiography and embolization therapy has now been maximized. If the patient continues to hemorrhage, repeat endoscopy or surgery would likely be required. Signed, Criselda Peaches, MD [facility] Radiology Electronically Signed   By: Jacqulynn Cadet M.D.   On: 04/13/2017 19:09   Ir US Guide Vasc Access Right  Result Date: 04/13/2017 INDICATION: 62 year old female with gastric ulcer and a visible bleeding vessel. Patient underwent embolization yesterday but continues to demonstrate evidence of upper GI bleeding. Repeat angiography is requested. EXAM: IR EMBO ART VEN HEMORR LYMPH EXTRAV INC GUIDE ROADMAPPING; ARTERIOGRAPHY; IR ULTRASOUND GUIDANCE VASC ACCESS RIGHT; SELECTIVE VISCERAL ARTERIOGRAPHY; ADDITIONAL ARTERIOGRAPHY; IR RIGHT FLOURO GUIDE CV LINE 1. Ultrasound-guided access right common femoral artery 2. Catheterization and angiogram of the left gastric artery 3. Catheterization and angiogram the celiac axis 4. Catheterization and angiogram of the splenic artery 5. Catheterization and angiogram of the superior division of the splenic artery 6. Catheterization and angiogram of the inferior division of the splenic artery 7. Coil embolization of the inferior division of the splenic artery MEDICATIONS: None ANESTHESIA/SEDATION: Moderate (conscious) sedation was employed during this procedure. A total of Versed 1 mg was administered intravenously. The patient was on a fentanyl drip. Moderate Sedation Time: 60 minutes. The patient's level of consciousness and vital signs were monitored continuously by radiology nursing throughout the procedure under my direct supervision. CONTRAST:  80 mL Isovue 300 FLUOROSCOPY TIME:  Fluoroscopy Time: 13 minutes 6 seconds  (1299 mGy). COMPLICATIONS: None immediate. PROCEDURE: Informed consent was obtained from the patient following explanation of the procedure, risks, benefits and alternatives. The patient understands, agrees and consents for the procedure. All questions were addressed. A time out was performed prior to the initiation of the procedure. Maximal barrier sterile technique utilized including caps, mask, sterile gowns, sterile gloves, large sterile drape, hand hygiene, and Betadine prep. The right common femoral artery was interrogated with ultrasound and found to be widely patent. An image was obtained and stored for the medical record. Local anesthesia was attained by infiltration with 1% lidocaine. A small dermatotomy was made. Under real-time sonographic guidance, the vessel was punctured with a 21 gauge micropuncture needle. Using standard technique, the initial micro needle was exchanged over a 0.018 micro wire for a transitional 4 [place] micro sheath. The micro sheath was then exchanged  over a 0.035 wire for a 5 [place] vascular sheath. A C2 cobra catheter was advanced over a Bentson wire into the abdominal aorta. The catheter was used to select the left gastric artery which arises directly from the aorta. Catheter angiography was performed. Successful coil embolization of the left gastric artery on the prior angiogram. There is no significant perfusion in the region of the vascular clip. The C2 cobra catheter was then advanced into the celiac artery and a celiac arteriogram was performed. The vessels are significantly attenuated consistent with diffuse vaso spasm. Branches of the short gastric arteries are identified in the region of the previously placed endoscopic clip. Using a glidewire, the 5 [place] catheter was successfully advanced into the splenic artery. Angiography was then performed in multiple obliquities. The splenic artery divides into a superior and inferior division. Both divisions give rise to some a  short gastric arteries. A Renegade high flow microcatheter was successfully advanced into the distal splenic artery and then into the superior division. Superior division short gastric arteries appear to provide blood flow more superiorly along the gastric fundus. The microcatheter was then successfully navigated into the inferior division of the splenic artery. The short gastric arteries are in close proximity to the endovascular clip. No definite active extravasation was identified. The decision was made to perform coil embolization. This will likely devascularize approximately 30-40% of the lower pole of the spleen. Coil embolization was performed using a combination of 2 and 3 mm interlock and vortex coils. Post embolization arteriography demonstrates complete cessation of flow in the arteries in the region of the previously placed endovascular clip. The catheters were removed. Hemostasis was attained with the assistance of an ExoSeal extra arterial vascular plug. IMPRESSION: 1. Successful prior coil embolization of the left gastric artery. 2. Coil embolization of the inferior division of the splenic artery and is associated short gastric arteries in the region of the previously placed endovascular clip. Angiography and embolization therapy has now been maximized. If the patient continues to hemorrhage, repeat endoscopy or surgery would likely be required. Signed, Criselda Peaches, MD [facility] Radiology Electronically Signed   By: Jacqulynn Cadet M.D.   On: 04/13/2017 19:09   Dg Chest Port 1 View  Result Date: 04/14/2017 CLINICAL DATA:  Respiratory failure EXAM: PORTABLE CHEST 1 VIEW COMPARISON:  04/13/2017 FINDINGS: Cardiac shadow is within normal limits. Endotracheal tube and nasogastric catheter are again noted in satisfactory position. Previously seen left jugular line is been removed and a new right jugular line placed in satisfactory position.  Bilateral pleural effusions right greater than left are noted with associated atelectatic changes. IMPRESSION: Increasing bilateral pleural effusions and atelectasis. Electronically Signed   By: Inez Catalina M.D.   On: 04/14/2017 06:59   Dg Abd Portable 1v  Result Date: 04/13/2017 CLINICAL DATA:  62 year old female with nasogastric tube placement. Subsequent encounter. EXAM: PORTABLE ABDOMEN - 1 VIEW COMPARISON:  04/13/2017 CT FINDINGS: Nasogastric tube tip pylorus level with side hole body/antrum junction. Residual contrast within dilated left renal collecting system. Abnormal bowel gas pattern.  Please see CT report. IMPRESSION: Nasogastric tube tip gastric pylorus level with side hole gastric body/antrum junction. Electronically Signed   By: Genia Del M.D.   On: 04/13/2017 15:36   [place] Guide Roadmapping  Result Date: 04/13/2017 INDICATION: 62 year old female with gastric ulcer and a visible bleeding vessel. Patient underwent embolization yesterday but continues to demonstrate evidence of  upper GI bleeding. Repeat angiography is requested. EXAM: IR EMBO ART VEN HEMORR LYMPH EXTRAV INC GUIDE ROADMAPPING; ARTERIOGRAPHY; IR ULTRASOUND GUIDANCE VASC ACCESS RIGHT; SELECTIVE VISCERAL ARTERIOGRAPHY; ADDITIONAL ARTERIOGRAPHY; IR RIGHT FLOURO GUIDE CV LINE 1. Ultrasound-guided access right common femoral artery 2. Catheterization and angiogram of the left gastric artery 3. Catheterization and angiogram the celiac axis 4. Catheterization and angiogram of the splenic artery 5. Catheterization and angiogram of the superior division of the splenic artery 6. Catheterization and angiogram of the inferior division of the splenic artery 7. Coil embolization of the inferior division of the splenic artery MEDICATIONS: None ANESTHESIA/SEDATION: Moderate (conscious) sedation was employed during this procedure. A total of Versed 1 mg was administered intravenously. The patient was on a  fentanyl drip. Moderate Sedation Time: 60 minutes. The patient's level of consciousness and vital signs were monitored continuously by radiology nursing throughout the procedure under my direct supervision. CONTRAST:  80 mL Isovue 300 FLUOROSCOPY TIME:  Fluoroscopy Time: 13 minutes 6 seconds (1299 mGy). COMPLICATIONS: None immediate. PROCEDURE: Informed consent was obtained from the patient following explanation of the procedure, risks, benefits and alternatives. The patient understands, agrees and consents for the procedure. All questions were addressed. A time out was performed prior to the initiation of the procedure. Maximal barrier sterile technique utilized including caps, mask, sterile gowns, sterile gloves, large sterile drape, hand hygiene, and Betadine prep. The right common femoral artery was interrogated with ultrasound and found to be widely patent. An image was obtained and stored for the medical record. Local anesthesia was attained by infiltration with 1% lidocaine. A small dermatotomy was made. Under real-time sonographic guidance, the vessel was punctured with a 21 gauge micropuncture needle. Using standard technique, the initial micro needle was exchanged over a 0.018 micro wire for a transitional 4 [place] micro sheath. The micro sheath was then exchanged over a 0.035 wire for a 5 French vascular sheath. A C2 cobra catheter was advanced over a Bentson wire into the abdominal aorta. The catheter was used to select the left gastric artery which arises directly from the aorta. Catheter angiography was performed. Successful coil embolization of the left gastric artery on the prior angiogram. There is no significant perfusion in the region of the vascular clip. The C2 cobra catheter was then advanced into the celiac artery and a celiac arteriogram was performed. The vessels are significantly attenuated consistent with diffuse vaso spasm. Branches of the short gastric arteries are identified in the  region of the previously placed endoscopic clip. Using a glidewire, the 5 [place] catheter was successfully advanced into the splenic artery. Angiography was then performed in multiple obliquities. The splenic artery divides into a superior and inferior division. Both divisions give rise to some a short gastric arteries. A Renegade high flow microcatheter was successfully advanced into the distal splenic artery and then into the superior division. Superior division short gastric arteries appear to provide blood flow more superiorly along the gastric fundus. The microcatheter was then successfully navigated into the inferior division of the splenic artery. The short gastric arteries are in close proximity to the endovascular clip. No definite active extravasation was identified. The decision was made to perform coil embolization. This will likely devascularize approximately 30-40% of the lower pole of the spleen. Coil embolization was performed using a combination of 2 and 3 mm interlock and vortex coils. Post embolization arteriography demonstrates complete cessation of flow in the arteries in the region of the previously placed endovascular clip. The catheters were removed.  Hemostasis was attained with the assistance of an ExoSeal extra arterial vascular plug. IMPRESSION: 1. Successful prior coil embolization of the left gastric artery. 2. Coil embolization of the inferior division of the splenic artery and is associated short gastric arteries in the region of the previously placed endovascular clip. Angiography and embolization therapy has now been maximized. If the patient continues to hemorrhage, repeat endoscopy or surgery would likely be required. Signed, Criselda Peaches, MD [facility] Radiology Electronically Signed   By: Jacqulynn Cadet M.D.   On: 04/13/2017 19:09    Anti-infectives: Anti-infectives (From admission, onward)   Start     Dose/Rate  Route Frequency Ordered Stop   04/04/17 1500  cefTRIAXone (ROCEPHIN) 2 g in dextrose 5 % 50 mL IVPB  Status:  Discontinued     2 g 100 mL/hr over 30 Minutes Intravenous Every 24 hours 04/04/17 1307 04/12/17 0213   04/03/17 0400  vancomycin (VANCOCIN) 500 mg in sodium chloride 0.9 % 100 mL IVPB  Status:  Discontinued     500 mg 100 mL/hr over 60 Minutes Intravenous Every 12 hours 04/02/17 1630 04/04/17 1307   04/02/17 2200  piperacillin-tazobactam (ZOSYN) IVPB 3.375 g  Status:  Discontinued     3.375 g 12.5 mL/hr over 240 Minutes Intravenous Every 8 hours 04/02/17 1630 04/04/17 1307   04/02/17 1600  piperacillin-tazobactam (ZOSYN) IVPB 3.375 g     3.375 g 100 mL/hr over 30 Minutes Intravenous  Once 04/02/17 1546 04/02/17 1645   04/02/17 1600  vancomycin (VANCOCIN) IVPB 1000 mg/200 mL premix     1,000 mg 200 mL/hr over 60 Minutes Intravenous  Once 04/02/17 1546 04/02/17 1711      Assessment/Plan: Patient Active Problem List   Diagnosis Date Noted  . Encounter for intubation   . Respiratory failure ([place])   . Persistent atrial fibrillation with rapid ventricular response ([place]) 04/07/2017  . Gastrointestinal hemorrhage with melena   . Lower abdominal pain of unknown etiology   . Abnormal computed tomography angiography (CTA) of abdomen and pelvis   . Acute blood loss anemia 04/02/2017  OVERALL stable  Required 1 u PRBC  No acute surgical need  Will follow along    LOS: 13 days    Marcello Moores A Lesieli Bresee 04/15/2017

## 2017-04-15 NOTE — Progress Notes (Signed)
Attending:  I have seen and examined the patient with nurse practitioner/resident and agree with the note above.  We formulated the plan together and I elicited the following history.    Subjective: Stable overnight, off pressors, then around 0730 this morning passed a bloody bowel movement  Objective: Vitals:   04/15/17 0545 04/15/17 0600 04/15/17 0712 04/15/17 0741  BP: (!) 124/45 (!) 132/48  (!) 129/45  Pulse: (!) 56 61  67  Resp: 18 18  (!) 22  Temp:   98.5 F (36.9 C)   TempSrc:   Oral   SpO2: 100% 100%  100%  Weight:      Height:       Vent Mode: PRVC FiO2 (%):  [30 %-60 %] 40 % Set Rate:  [18 bmp] 18 bmp Vt Set:  [420 mL] 420 mL PEEP:  [5 cmH20] 5 cmH20 Plateau Pressure:  [14 cmH20-18 cmH20] 18 cmH20  Intake/Output Summary (Last 24 hours) at 04/15/2017 0807 Last data filed at 04/15/2017 0500 Gross per 24 hour  Intake 2194.02 ml  Output 1100 ml  Net 1094.02 ml    General:  In bed on vent HENT: NCAT ETT in place PULM: CTA B, vent supported breathing CV: RRR, no mgr GI: BS+, soft, nontender MSK: normal bulk and tone Neuro: awake on vent, interactive     CBC    Component Value Date/Time   WBC 12.3 (H) 04/15/2017 0507   RBC 2.82 (L) 04/15/2017 0507   HGB 8.5 (L) 04/15/2017 0507   HCT 26.3 (L) 04/15/2017 0507   PLT 54 (L) 04/15/2017 0507   MCV 93.3 04/15/2017 0507   MCH 30.1 04/15/2017 0507   MCHC 32.3 04/15/2017 0507   RDW 17.2 (H) 04/15/2017 0507   LYMPHSABS 2.5 04/12/2017 0412   MONOABS 1.8 (H) 04/12/2017 0412   EOSABS 0.0 04/12/2017 0412   BASOSABS 0.0 04/12/2017 0412    BMET    Component Value Date/Time   NA 146 (H) 04/15/2017 0507   K 3.6 04/15/2017 0507   CL 105 04/15/2017 0507   CO2 29 04/15/2017 0507   GLUCOSE 92 04/15/2017 0507   BUN 18 04/15/2017 0507   CREATININE 1.60 (H) 04/15/2017 0507   CALCIUM 7.5 (L) 04/15/2017 0507   GFRNONAA 33 (L) 04/15/2017 0507   GFRAA 38 (L) 04/15/2017 0507    CXR images reviewed bibasilar edema and  layering effusions  Impression/Plan:  Acute respiratory failure with hypoxemia: pressure support now, planning for extubation if not bleeding Acute blood loss anemia:monitor stat CBC and Coag now, transfuse PRBC if Hgb < 7gm/dL Acute upper GI bleed: I think that this morning's bloody bowel movement is likely old blood given her hemodynamic stability compared to the last two days; will lavage stomach now to check for blood, follow stat labs; if labs/lavage OK then plan for extubation; if evidence of bleeding then will call GI medicine again AKI: stable UOP, Monitor BMET and UOP Replace electrolytes as needed Pulmonary edema: lasix today if not bleeding  My cc time 35 minutes  Roselie Awkward, MD [facility] Pager: [phone] Cell: [phone] After 3pm or if no response, call [phone]

## 2017-04-15 NOTE — Progress Notes (Signed)
[facility] Progress Note Patient Name: Namira Rosekrans DOB: 1955-05-27 MRN: [id number]   Date of Service  04/15/2017  HPI/Events of Note  Agitation - Request to renew restraint orders.   eICU Interventions  Will renew restraint orders.      Intervention Category Minor Interventions: Agitation / anxiety - evaluation and management  Bushra Denman [place] 04/15/2017, 3:39 AM

## 2017-04-15 NOTE — Progress Notes (Signed)
eLink Physician-Brief Progress Note Patient Name: Ulla Mckiernan DOB: Sep 21, 1955 MRN: [id number]   Date of Service  04/15/2017  HPI/Events of Note  Anemia - Hgb = 7.3 --> 7.1 ==> 7.0.   eICU Interventions  Will transfuse 1 unit PRBC.     Intervention Category Major Interventions: Other:  Dakarri Kessinger Cornelia Copa 04/15/2017, 1:51 AM

## 2017-04-15 NOTE — Procedures (Signed)
Extubation Procedure Note  Patient Details:   Name: Kayla Wyatt DOB: 13-Mar-1955 MRN: [id number]   Airway Documentation:     Evaluation  O2 sats: stable throughout Complications: No apparent complications Patient did tolerate procedure well. Bilateral Breath Sounds: Clear, Diminished   Yes   Pt extubated to 3L [place] per MD order. Positive cuff leak noted. No complications, VS within normal limits. Pt able to speak and has a strong non-productive cough post extubation. Pt encouraged to use [place] to help clear secretions.   Laymond Purser M 04/15/2017, 10:58 AM

## 2017-04-15 NOTE — Progress Notes (Signed)
PULMONARY / CRITICAL CARE MEDICINE   Name: Kayla Wyatt MRN: [id number] DOB: Jun 14, 1955    ADMISSION DATE:  04/02/2017 CONSULTATION DATE:  04/12/2017  REFERRING MD:  Dr. Shanon Brow   CHIEF COMPLAINT:  GI Bleed   HISTORY OF PRESENT ILLNESS:   62 year old female with PMH of HTN, HLD  Presented to ED On 1/24 with reported two days of ABD pain and bloody diarrhea. States she has had 15+ bowel movements in the morning of arrival. CT A/P with suspicion for ileus. GI consulted. EGD 1/25 with single visible vessel in the setting of a mild ulceration was found in the stomach in which was clipped. Patient continued to have anemia, hypotension, and melena. Was taken for colonoscopy on 1/31 in which revealed diverticulosis. 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 2/2 patient had hemoptysis and Xarelto was stopped, given 4 units RBCs. & 2 FFP . 2/3 in the early morning patient was found minimally responsive and hypotensive, requiring intubation and vent support. Transferred to ICU and GI called.   She was given Xarelto 4 factor [place] reversal agent at 12:45 on 2/3. 1 pack of platelets . 2 additional PRBC She was started on pressor support. Pressors were weaned, additional blood given.   SUBJECTIVE:   Transfused 1U PRBC overnight, agitated overnight. This morning she is sitting in a pool of blood. No blood from NG tube.  VITAL SIGNS: BP (!) 132/48   Pulse 61   Temp 98.5 F (36.9 C) (Oral)   Resp 18   Ht 5\' 6"  (1.676 m)   Wt 195 lb 5.2 oz (88.6 kg)   SpO2 100%   BMI 31.53 kg/m   HEMODYNAMICS:    VENTILATOR SETTINGS: Vent Mode: PRVC FiO2 (%):  [30 %-60 %] 40 % Set Rate:  [18 bmp] 18 bmp Vt Set:  [420 mL] 420 mL PEEP:  [5 cmH20] 5 cmH20 Plateau Pressure:  [14 cmH20-18 cmH20] 18 cmH20  INTAKE / OUTPUT: I/O last 3 completed shifts: In:  4990.2 [I.V.:3843.8; Blood:876.5; IV Piggyback:270] Out: 1840 [[id number]]  PHYSICAL EXAMINATION: General:  Critically ill elderly female intubated on MV in NAD HEENT: MM pink/moist, dry lips, ETT, OGT Neuro: RASS +1, Follows commands/ MAE CV: ir rrr, no m/r/g, right groin site soft, no ecchymosis  PULM: even/non-labored on MV,  lungs bilaterally coarse, diminished bibasilarly  GI: mildly distended, soft, hypoBS Extremities: warm/dry, generalized edema +1 Skin: no rashes   LABS:  BMET Recent Labs  Lab 04/13/17 0346 04/14/17 0044 04/15/17 0507  NA 145 146* 146*  K 3.9 3.8 3.6  CL 105 103 105  CO2 28 31 29   BUN 12 16 18   CREATININE 1.46* 1.59* 1.60*  GLUCOSE 223* 131* 92    Electrolytes Recent Labs  Lab 04/12/17 0342  04/13/17 0346 04/14/17 0044 04/15/17 0507  CALCIUM 6.3*   < > 6.4* 7.2* 7.5*  MG 1.5*  --  2.0 2.0  --   PHOS 7.3*  --  5.0* 5.6* 4.9*   < > = values in this interval not displayed.    CBC Recent Labs  Lab 04/13/17 0346  04/14/17 0338  04/14/17 2026 04/15/17 0033 04/15/17 0507  WBC 23.5*  --  22.6*  --   --   --  12.3*  HGB 4.5*   < > 8.9*   < >  7.1* 7.0* 8.5*  HCT 13.0*   < > 26.0*   < > 21.5* 21.5* 26.3*  PLT 66*  --  72*  --   --   --  54*   < > = values in this interval not displayed.    Coag's Recent Labs  Lab 04/13/17 0926 04/14/17 0920  INR 1.97 1.27    Sepsis Markers Recent Labs  Lab 04/12/17 1107 04/13/17 0346 04/13/17 1030  LATICACIDVEN 2.8* 5.9* 4.2*    ABG Recent Labs  Lab 04/13/17 0330 04/13/17 0914 04/14/17 0404  PHART 7.545* 7.460* 7.467*  PCO2ART 39.1 43.4 47.7  PO2ART 106 91.0 68.2*    Liver Enzymes Recent Labs  Lab 04/15/17 0507  ALBUMIN 1.5*    Cardiac Enzymes No results for input(s): TROPONINI, PROBNP in the last 168 hours.  Glucose Recent Labs  Lab 04/14/17 1158 04/14/17 1546 04/14/17 2008 04/15/17 0025 04/15/17 0421 04/15/17 0710  GLUCAP 109* 88 97 95 90 79    Imaging No  results found. STUDIES:  CTA A/P 1/26 > Significant aortic atherosclerosis, with focal high-grade aortic stenosis secondary to mixed calcified and soft plaque, occurring at the level of the inferior mesenteric artery. If there is concern for short distance claudication, correlation with noninvasive lower extremity exam may be useful. Aortic Atherosclerosis (ICD10-I70.0). Atherosclerotic changes at the origin of the mesenteric arteries, however, only the IMA demonstrates evidence of high-grade stenosis, and there appears to be good collateral flow to the IMA distribution secondary to enlarged meandering mesenteric artery/arc of [place]. It is uncertain on the CTA as to what degree atherosclerotic changes/stenosis of the common hepatic artery are contributing to flow compromise. There are 2 right renal arteries and 3 left renal arteries. Atherosclerotic changes involve the origin of all of the renal arteries, with at least 50% stenosis secondary to mixed calcified and soft plaque at the origin of the largest bilateral renal Arteries. ECHO 1/28 > The estimated ejection fraction was in the range of 55% to 60%. Wall motion was normal; there were no regional wall motion abnormalities. Features are consistent with a pseudonormal left ventricular filling pattern, with concomitant abnormal relaxation and increased filling pressure (grade 2 diastolic dysfunction). NM 1/29 > No active bleed  CTA Chest 1/29 > 1. 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 Recommend tissue sampling. 2. No pulmonary embolism seen, with mild study limitations detailed Above. 3. Small right pleural effusion.  Mild bibasilar atelectasis. 4. Mild emphysematous changes within the lung apices VAS [place] Lower Venous 1/29 > DVT in Right Gastrocnemius  EGD 1/25 > - Normal esophagus. - A single visible vessel in the setting of a mild ulceration was found in the stomach.  Clip (MR unsafe) was placed- Normal examined duodenum.- No specimens collected. Colonoscopy 1/31 >  Diverticulosis in the sigmoid colon. CT ABD 2/5 >>  1. No evidence for retroperitoneal hematoma. 2. Striated nephrographic appearance of both kidneys identified, right greater than left. Imaging findings are favored to represent sequelae of acute tubular necrosis. Not excluded but favored less likely would be an embolic phenomenon resulting in areas of renal infarct. 3. Left-sided hydronephrosis and hydroureter. Etiology indeterminate. Cannot rule out distal ureteral obstruction. 4. Bilateral pleural effusions, ascites and diffuse body wall edema compatible with fluid overload. 5. Intermediate to high attenuation material within the stomach. Although conceivably this could be related to it administration of enteric contrast material ongoing bleeding within the gastric lumen is not excluded.  Echo 2/5 > LV EF: 65% -   70% - Left ventricle: The cavity size was normal. Wall thickness was   normal. Systolic function was vigorous. The estimated ejection   fraction was in the range of 65% to 70%. Wall motion was normal;   there were no regional wall motion abnormalities. Doppler   parameters are consistent with abnormal left ventricular   relaxation (grade 1 diastolic dysfunction).  CULTURES: MRSA 1/24 > Negative  Blood 1/26 > Negative  Urine Culture 1/27 > Negative  Blood 1/14 x 2 > E.coli  ANTIBIOTICS: Zosyn 1/24 > 1/26 Vancomycin 1/24 > 1/26 Rocephin 1/26 > 2/3  SIGNIFICANT EVENTS: 1/24  Presents to ED  1/25  EGD- gastric ulcer clipped 1/31  colonoscopy-> diverticulosis 1/29  + RLE DVT, xarelto started 2/2    Hemoptysis; xarelto stopped, transfused 2/3   Hypotensive, AMS, intubated; IR embolization, IVC filter  2/4    Ongoing bleeding, TXA and blood products; IR intervention  LINES/TUBES: ETT 2/3 >>  PIV  LIJ 2/3 >> 2/4 R SVC 2/4 >> Foley 2/3 >>  DISCUSSION: 62 year old  female presents with melena and ABD pain. EGD on 1/25 with single visible vessel in the setting of mild ulceration where clips was placed. Patient continued to have hypotension and bleeding. On 1/31 underwent colonoscopy in which revealed diverticulosis. During stay patient was found to have right LE DVT and started on Xarelto on 2/1. 2/2 patient had hemoptysis and Xarelto was stopped, given 4 units RBCs. 2/3 in the early morning patient was found minimally responsive and hypotensive, requiring intubation. 2 u PRBC , 1 pk platelets and Kcentra.  2/4, continued anemia, back to IR for embolization, transfused again and TXA given.  ASSESSMENT / PLAN:  PULMONARY A: Acute Respiratory Failure in setting of severe  GI Bleed /Shock   4.1 cm mass in the aortopulmonary window  Pleural effusions P:   Continue Vent Support  Daily SBT if meets criteria VAP Bundle  Trend CXR/ABG  Mass workup only once more stable  Unable to diuresis secondary to hypotension/ pressors requirements  CARDIOVASCULAR A:  Hemorraghic Shock - improving A.Fib RVR - resolved  Diastolic HF (O2VO EF 35-00)  H/O HTN, HLD  P:  Cardiac Monitoring  Off pressors, monitor BP closely  RENAL A:   AKI - likely related to IV dye exposure x 2  Anion Gap Metabolic Acidosis/ lactic acidosis- resolved Hypokalemia - resolved Hypomagnesemia - resolved Hypernatremia - resolved  Possible distal ureteral obstruction- per CT abd  Anasarca  P:   Trend BMP /Mag/ Phos/urinary output Replace electrolytes as indicated  GASTROINTESTINAL A:   UGIB - due to gastric ulcer, s/p clipping 1/25 and coil embolization by IR 2/3, IR embolization 2/4 Melena, Hemoptysis - resolved Transaminitis > Improved  Uterine Prolapse   - CT abd neg for retroperitoneal bleed P:   GI Following, if Hgb drops again, will repeat EGD Stat CBC and INR this am due to acute bleeding NPO PPI BID Trend H&H q6H   HEMATOLOGIC A:   Acute Blood Loss Anemia  secondary to UGIB  RLE DVT - s/p IVC filter placement Thrombocytopenia  P:  Hold all anticoagulation (Xarelto started on 2/1, received 2 doses, stopped due to bleeding and also given Kcentra 2/3) Trend H/H  INFECTIOUS A:   E.Coli bacteremia - s/p course of ceftriaxone P:   Monitor clinically Trend WBC/ fever curver  ENDOCRINE A:   Hyperglycemia    P:   Trend Glucose  SSI   NEUROLOGIC A:   Metabolic Encephalopathy  P:   Fentanyl gtt / Versed PRN to achieve RASS RASS goal: 0/-1 Daily WUA  FAMILY  - Updates: Family updated 2/3.  None at bedside 2/4.  No family at bedside 2/5. - Inter-disciplinary family meet or Palliative Care meeting due by: 04/19/2017   Lucila Maine, DO PGY-2, [facility] 04/15/2017 7:33 AM

## 2017-04-16 ENCOUNTER — Encounter (HOSPITAL_COMMUNITY): Payer: Self-pay | Admitting: Physical Medicine and Rehabilitation

## 2017-04-16 DIAGNOSIS — G7281 Critical illness myopathy: Secondary | ICD-10-CM

## 2017-04-16 LAB — GLUCOSE, CAPILLARY
Glucose-Capillary: 74 mg/dL (ref 65–99)
Glucose-Capillary: 79 mg/dL (ref 65–99)
Glucose-Capillary: 86 mg/dL (ref 65–99)
Glucose-Capillary: 95 mg/dL (ref 65–99)
Glucose-Capillary: 96 mg/dL (ref 65–99)

## 2017-04-16 LAB — TYPE AND SCREEN
ABO/RH(D): AB POS
Antibody Screen: NEGATIVE
Unit division: 0

## 2017-04-16 LAB — CBC
HCT: 24.7 % — ABNORMAL LOW (ref 36.0–46.0)
Hemoglobin: 7.9 g/dL — ABNORMAL LOW (ref 12.0–15.0)
MCH: 30.5 pg (ref 26.0–34.0)
MCHC: 32 g/dL (ref 30.0–36.0)
MCV: 95.4 fL (ref 78.0–100.0)
Platelets: 52 10*3/uL — ABNORMAL LOW (ref 150–400)
RBC: 2.59 MIL/uL — ABNORMAL LOW (ref 3.87–5.11)
RDW: 18.8 % — ABNORMAL HIGH (ref 11.5–15.5)
WBC: 6.9 10*3/uL (ref 4.0–10.5)

## 2017-04-16 LAB — COMPREHENSIVE METABOLIC PANEL
ALT: 152 U/L — ABNORMAL HIGH (ref 14–54)
AST: 93 U/L — ABNORMAL HIGH (ref 15–41)
Albumin: 1.5 g/dL — ABNORMAL LOW (ref 3.5–5.0)
Alkaline Phosphatase: 40 U/L (ref 38–126)
Anion gap: 13 (ref 5–15)
BUN: 18 mg/dL (ref 6–20)
CO2: 29 mmol/L (ref 22–32)
Calcium: 7.5 mg/dL — ABNORMAL LOW (ref 8.9–10.3)
Chloride: 105 mmol/L (ref 101–111)
Creatinine, Ser: 1.54 mg/dL — ABNORMAL HIGH (ref 0.44–1.00)
GFR calc Af Amer: 40 mL/min — ABNORMAL LOW (ref >60)
GFR calc non Af Amer: 35 mL/min — ABNORMAL LOW (ref >60)
Glucose, Bld: 86 mg/dL (ref 65–99)
Potassium: 2.8 mmol/L — ABNORMAL LOW (ref 3.5–5.1)
Sodium: 147 mmol/L — ABNORMAL HIGH (ref 135–145)
Total Bilirubin: 0.8 mg/dL (ref 0.3–1.2)
Total Protein: 3.5 g/dL — ABNORMAL LOW (ref 6.5–8.1)

## 2017-04-16 LAB — HEMOGLOBIN AND HEMATOCRIT, BLOOD
HCT: 24.9 % — ABNORMAL LOW (ref 36.0–46.0)
HCT: 25.8 % — ABNORMAL LOW (ref 36.0–46.0)
HCT: 26.6 % — ABNORMAL LOW (ref 36.0–46.0)
Hemoglobin: 8 g/dL — ABNORMAL LOW (ref 12.0–15.0)
Hemoglobin: 8.1 g/dL — ABNORMAL LOW (ref 12.0–15.0)
Hemoglobin: 8.5 g/dL — ABNORMAL LOW (ref 12.0–15.0)

## 2017-04-16 LAB — BPAM RBC
Blood Product Expiration Date: 201903042359
ISSUE DATE / TIME: 201902060200
Unit Type and Rh: 8400

## 2017-04-16 MED ORDER — BOOST / RESOURCE BREEZE PO LIQD CUSTOM
1.0000 | Freq: Three times a day (TID) | ORAL | Status: DC
  Administered 2017-04-16 – 2017-04-22 (×15): 1 via ORAL

## 2017-04-16 MED ORDER — FUROSEMIDE 10 MG/ML IJ SOLN
60.0000 mg | Freq: Once | INTRAMUSCULAR | Status: AC
  Administered 2017-04-16: 60 mg via INTRAVENOUS
  Filled 2017-04-16: qty 6

## 2017-04-16 MED ORDER — FUROSEMIDE 10 MG/ML IJ SOLN
60.0000 mg | Freq: Two times a day (BID) | INTRAMUSCULAR | Status: DC
  Administered 2017-04-16 – 2017-04-19 (×7): 60 mg via INTRAVENOUS
  Filled 2017-04-16 (×6): qty 6

## 2017-04-16 MED ORDER — POTASSIUM CHLORIDE CRYS ER 20 MEQ PO TBCR
40.0000 meq | EXTENDED_RELEASE_TABLET | ORAL | Status: AC
Start: 2017-04-16 — End: 2017-04-16
  Administered 2017-04-16 (×2): 40 meq via ORAL
  Filled 2017-04-16 (×2): qty 2

## 2017-04-16 MED ORDER — POTASSIUM CHLORIDE CRYS ER 20 MEQ PO TBCR
40.0000 meq | EXTENDED_RELEASE_TABLET | Freq: Two times a day (BID) | ORAL | Status: DC
  Administered 2017-04-16 – 2017-04-20 (×8): 40 meq via ORAL
  Filled 2017-04-16 (×9): qty 2

## 2017-04-16 MED ORDER — PANTOPRAZOLE SODIUM 40 MG PO TBEC
40.0000 mg | DELAYED_RELEASE_TABLET | Freq: Two times a day (BID) | ORAL | Status: DC
  Administered 2017-04-16 – 2017-04-22 (×12): 40 mg via ORAL
  Filled 2017-04-16 (×12): qty 1

## 2017-04-16 NOTE — Progress Notes (Signed)
No further signs of heavy bleeding  Will sign off for now  May benefit from follow up endoscopy at some point

## 2017-04-16 NOTE — Progress Notes (Signed)
PULMONARY / CRITICAL CARE MEDICINE   Name: Kayla Wyatt MRN: [id number] DOB: 09/14/1955    ADMISSION DATE:  04/02/2017 CONSULTATION DATE:  04/12/2017  REFERRING MD:  Dr. Shanon Brow   CHIEF COMPLAINT:  GI Bleed   HISTORY OF PRESENT ILLNESS:   62 year old female with PMH of HTN, HLD  Presented to ED On 1/24 with reported two days of ABD pain and bloody diarrhea. States she has had 15+ bowel movements in the morning of arrival. CT A/P with suspicion for ileus. GI consulted. EGD 1/25 with single visible vessel in the setting of a mild ulceration was found in the stomach in which was clipped. Patient continued to have anemia, hypotension, and melena. Was taken for colonoscopy on 1/31 in which revealed diverticulosis. CTA to check for PE showed a 4.1 cm mass in the mediastinum with scattered lymphadenopathy and a right pleural effusion  During stay patient was found to have right LE DVT and started on Xarelto on 2/1. 2/2 patient had hemoptysis and Xarelto was stopped, given 4 units RBCs. & 2 FFP . 2/3 in the early morning patient was found minimally responsive and hypotensive, requiring intubation and vent support. Transferred to ICU and GI called.   She was given Xarelto 4 factor [place] reversal agent at 12:45 on 2/3. 1 pack of platelets . 2 additional PRBC She was started on pressor support. Pressors were weaned, additional blood given.   SUBJECTIVE:   No acute events overnight, potassium repleted. On 2L [place].   VITAL SIGNS: BP (!) 123/43   Pulse (!) 55   Temp 98.5 F (36.9 C) (Oral)   Resp 16   Ht 5\' 6"  (1.676 m)   Wt 188 lb 4.4 oz (85.4 kg)   SpO2 100%   BMI 30.39 kg/m   HEMODYNAMICS:    VENTILATOR SETTINGS: N/a  INTAKE / OUTPUT: I/O last 3 completed shifts: In: 1621.9 [I.V.:1306.9; Blood:315] Out: 7106 [Urine:4650; Stool:100]  PHYSICAL EXAMINATION: General:  Elderly female laying in bed in NAD HEENT: [place] in place, MMM Neuro: no focal deficits CV: RRR, no MRG. +2 radial pulses  bilaterally  PULM: crackles bibasilarly, normal work of breathing GI: soft, NDNT. +BS Extremities: warm, +2 pitting edema in LE bilaterally  Skin: in tact  LABS:  BMET Recent Labs  Lab 04/14/17 0044 04/15/17 0507 04/16/17 0400  NA 146* 146* 147*  K 3.8 3.6 2.8*  CL 103 105 105  CO2 31 29 29   BUN 16 18 18   CREATININE 1.59* 1.60* 1.54*  GLUCOSE 131* 92 86    Electrolytes Recent Labs  Lab 04/12/17 0342  04/13/17 0346 04/14/17 0044 04/15/17 0507 04/16/17 0400  CALCIUM 6.3*   < > 6.4* 7.2* 7.5* 7.5*  MG 1.5*  --  2.0 2.0  --   --   PHOS 7.3*  --  5.0* 5.6* 4.9*  --    < > = values in this interval not displayed.    CBC Recent Labs  Lab 04/15/17 0507 04/15/17 0755  04/15/17 1937 04/15/17 2334 04/16/17 0400  WBC 12.3* 12.4*  --   --   --  6.9  HGB 8.5* 8.8*   < > 8.4* 7.7* 7.9*  HCT 26.3* 27.5*   < > 25.9* 23.4* 24.7*  PLT 54* 57*  --   --   --  52*   < > = values in this interval not displayed.    Coag's Recent Labs  Lab 04/13/17 0926 04/14/17 0920 04/15/17 0755  INR 1.97 1.27  1.19    Sepsis Markers Recent Labs  Lab 04/12/17 1107 04/13/17 0346 04/13/17 1030  LATICACIDVEN 2.8* 5.9* 4.2*    ABG Recent Labs  Lab 04/13/17 0330 04/13/17 0914 04/14/17 0404  PHART 7.545* 7.460* 7.467*  PCO2ART 39.1 43.4 47.7  PO2ART 106 91.0 68.2*    Liver Enzymes Recent Labs  Lab 04/15/17 0507 04/16/17 0400  AST  --  93*  ALT  --  152*  ALKPHOS  --  40  BILITOT  --  0.8  ALBUMIN 1.5* 1.5*    Cardiac Enzymes No results for input(s): TROPONINI, PROBNP in the last 168 hours.  Glucose Recent Labs  Lab 04/15/17 0710 04/15/17 1150 04/15/17 1516 04/15/17 1947 04/16/17 0016 04/16/17 0405  GLUCAP 79 76 80 77 79 74    Imaging No results found. STUDIES:  CTA A/P 1/26 > Significant aortic atherosclerosis, with focal high-grade aortic stenosis secondary to mixed calcified and soft plaque, occurring at the level of the inferior mesenteric artery. If  there is concern for short distance claudication, correlation with noninvasive lower extremity exam may be useful. Aortic Atherosclerosis (ICD10-I70.0). Atherosclerotic changes at the origin of the mesenteric arteries, however, only the IMA demonstrates evidence of high-grade stenosis, and there appears to be good collateral flow to the IMA distribution secondary to enlarged meandering mesenteric artery/arc of [place]. It is uncertain on the CTA as to what degree atherosclerotic changes/stenosis of the common hepatic artery are contributing to flow compromise. There are 2 right renal arteries and 3 left renal arteries. Atherosclerotic changes involve the origin of all of the renal arteries, with at least 50% stenosis secondary to mixed calcified and soft plaque at the origin of the largest bilateral renal Arteries. ECHO 1/28 > The estimated ejection fraction was in the range of 55% to 60%. Wall motion was normal; there were no regional wall motion abnormalities. Features are consistent with a pseudonormal left ventricular filling pattern, with concomitant abnormal relaxation and increased filling pressure (grade 2 diastolic dysfunction). NM 1/29 > No active bleed  CTA Chest 1/29 > 1. 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 Recommend tissue sampling. 2. No pulmonary embolism seen, with mild study limitations detailed Above. 3. Small right pleural effusion.  Mild bibasilar atelectasis. 4. Mild emphysematous changes within the lung apices VAS [place] Lower Venous 1/29 > DVT in Right Gastrocnemius  EGD 1/25 > - Normal esophagus. - A single visible vessel in the setting of a mild ulceration was found in the stomach. Clip (MR unsafe) was placed- Normal examined duodenum.- No specimens collected. Colonoscopy 1/31 >  Diverticulosis in the sigmoid colon. CT ABD 2/5 >>  1. No evidence for retroperitoneal hematoma. 2. Striated  nephrographic appearance of both kidneys identified, right greater than left. Imaging findings are favored to represent sequelae of acute tubular necrosis. Not excluded but favored less likely would be an embolic phenomenon resulting in areas of renal infarct. 3. Left-sided hydronephrosis and hydroureter. Etiology indeterminate. Cannot rule out distal ureteral obstruction. 4. Bilateral pleural effusions, ascites and diffuse body wall edema compatible with fluid overload. 5. Intermediate to high attenuation material within the stomach. Although conceivably this could be related to it administration of enteric contrast material ongoing bleeding within the gastric lumen is not excluded. Echo 2/5 > LV EF: 65% -   70% - Left ventricle: The cavity size was normal. Wall thickness was   normal. Systolic function was vigorous. The estimated ejection   fraction  was in the range of 65% to 70%. Wall motion was normal;   there were no regional wall motion abnormalities. Doppler   parameters are consistent with abnormal left ventricular   relaxation (grade 1 diastolic dysfunction).  CULTURES: MRSA 1/24 > Negative  Blood 1/26 > Negative  Urine Culture 1/27 > Negative  Blood 1/14 x 2 > E.coli  ANTIBIOTICS: Zosyn 1/24 > 1/26 Vancomycin 1/24 > 1/26 Rocephin 1/26 > 2/3  SIGNIFICANT EVENTS: 1/24  Presents to ED  1/25  EGD- gastric ulcer clipped 1/31  colonoscopy-> diverticulosis 1/29  + RLE DVT, xarelto started 2/2    Hemoptysis; xarelto stopped, transfused 2/3   Hypotensive, AMS, intubated; IR embolization, IVC filter  2/4    Ongoing bleeding, TXA and blood products; IR intervention  LINES/TUBES: ETT 2/3 >> 2/6 PIV  LIJ 2/3 >> 2/4 R SVC 2/4 >> Foley 2/3 >>  DISCUSSION: 62 year old female presents with melena and ABD pain. EGD on 1/25 with single visible vessel in the setting of mild ulceration where clips was placed. Patient continued to have hypotension and bleeding. On 1/31 underwent  colonoscopy in which revealed diverticulosis. During stay patient was found to have right LE DVT and started on Xarelto on 2/1. 2/2 patient had hemoptysis and Xarelto was stopped, given 4 units RBCs. 2/3 in the early morning patient was found minimally responsive and hypotensive, requiring intubation. 2 u PRBC , 1 pk platelets and Kcentra.  2/4, continued anemia, back to IR for embolization, transfused again and TXA given.  ASSESSMENT / PLAN:  PULMONARY A: Acute Respiratory Failure in setting of severe  GI Bleed /Shock   4.1 cm mass in the aortopulmonary window  Pleural effusions P:  Mass workup only once more stable  Diurese as able   CARDIOVASCULAR A:  Hemorraghic Shock - resolved A.Fib RVR - resolved  Diastolic HF (T7SV EF 77-93)  H/O HTN, HLD  P:  Cardiac Monitoring  Off pressors, monitor BP closely  RENAL A:   AKI - likely related to IV dye exposure x 2  Anion Gap Metabolic Acidosis/ lactic acidosis- resolved Hypokalemia - resolved Hypomagnesemia - resolved Hypernatremia - resolved  Possible distal ureteral obstruction- per CT abd  Anasarca  P:   Trend BMP /Mag/ Phos/urinary output Replace electrolytes as indicated IV lasix  GASTROINTESTINAL A:   UGIB - due to gastric ulcer, s/p clipping 1/25 and coil embolization by IR 2/3, IR embolization 2/4 Melena, Hemoptysis - resolved Transaminitis > Improved  Uterine Prolapse   - CT abd neg for retroperitoneal bleed P:   GI Following, if Hgb drops again, will repeat EGD Clear diet, ADAT PPI BID Trend H&H q4 hour  HEMATOLOGIC A:   Acute Blood Loss Anemia secondary to UGIB  RLE DVT - s/p IVC filter placement Thrombocytopenia  P:  Hold all anticoagulation (Xarelto started on 2/1, received 2 doses, stopped due to bleeding and also given Kcentra 2/3) Trend H/H q4 hour  INFECTIOUS A:   E.Coli bacteremia - s/p course of ceftriaxone P:   Monitor clinically Trend WBC/ fever curver  ENDOCRINE A:   Hyperglycemia     P:   Trend Glucose  SSI   NEUROLOGIC A:   Metabolic Encephalopathy - resolved P:   Monitor   FAMILY  - Updates: Family updated 2/6 - Inter-disciplinary family meet or Palliative Care meeting due by: 04/19/2017   Lucila Maine, DO PGY-2, [facility] 04/16/2017 7:23 AM

## 2017-04-16 NOTE — Progress Notes (Signed)
Pt arrived to [place]. Alert and oriented x 4, brother at the bedside. VS stable, no symptoms of acute distress. Pt identified appropriately, placed on cardiac monitor, continuous pulse ox and 2L O2. CCMD notified. Pt oriented to room and equipment, instructed to use call bell for assistance and call bell left within reach. Will continue to monitor and treat pt per MD orders.

## 2017-04-16 NOTE — Consult Note (Signed)
Physical Medicine and Rehabilitation Consult   Reason for Consult: Debility due to GIB with multiple medical issues Referring Physician: Dr. Lake Bells   HPI: Kayla Wyatt is a 62 y.o. female with history of HTN who was admitted on 11/24/169 with abdominal pain, hematemesis, melena and ABLA due to GIB. She underwent clipping of gastric ulcer and received multiple units PRBC but continued to have drop in H/H with bloody stool. NM GI scan was negative for bleeding and she underwent colonoscopy by Dr. [name] 1/31 showing diverticulosis but no signs of bleeding. Hospital course significant for R- gastrocnemius DVT, PAF, incidental findings of mediastinal mass as well as brief episode of hematuria. She was cleared to start on Xarelto on 02/01 for treatment of DVT.  On am of 2/2 she developed hematemesis with maroon stools and hypotension requiring fluid bolus as well as 2 units PRBCs. She continued to decline requiring intubation, pressors as well as reversal of anticoagulation with Kcentra on 02/3. She underwent UGI revealing larg clot with fresh blood in stomach and underwent mesenteric arteriogram with percutaneous coil embolization fo distal tributary of left gastric artery and placement of IVC filter by interventional radiology.   [name] felt no further cardiac work up indicated and did not recommend initiating anticoagulation in short term. To follow up with [name] after PET scan and routine biopsy after medically stable.  She continued to have bleeding and underwent visceral angiogram with embolization of inferior division of splenic artery and associated gastric arteries on 2/4. No surgical intervention needed per [name] with recommendations to continue monitoring H/H as well as for recurrence of hematochezia. She tolerated extubation on 2/6 and therapy evaluation done today showing debility. CIR recommended due to functional deficits.   Patient denies any weakness of the legs  prior to her hospital admission.  Patient feels like her legs are swollen but also that her hands are swollen as well.  Her last PT visit 04/16/2017 worked on only bed mobility.  Prior PT visit was at 04/06/2017, this was before her respiratory failure and recurrent GI bleed, she was ambulating 120 feet with min guard assist  Review of Systems  Constitutional: Negative for chills and fever.  HENT: Negative for hearing loss and tinnitus.   Eyes: Negative for blurred vision and double vision.  Respiratory: Negative for cough, shortness of breath and wheezing.   Cardiovascular: Negative for chest pain and palpitations.  Gastrointestinal: Positive for blood in stool (this am per nurse notes). Negative for abdominal pain and heartburn.  Musculoskeletal: Positive for back pain and myalgias.  Skin: Negative for itching and rash.  Neurological: Positive for weakness. Negative for dizziness and headaches.  Psychiatric/Behavioral: The patient is not nervous/anxious and does not have insomnia.       Past Medical History:  Diagnosis Date  . Acute blood loss anemia 04/02/2017  . Gastrointestinal hemorrhage with melena   . Hypercholesteremia   . Hypertension   . Persistent atrial fibrillation with rapid ventricular response ([place]) 04/07/2017  . Vitamin D deficiency     Past Surgical History:  Procedure Laterality Date  . COLONOSCOPY Left 04/09/2017   Procedure: COLONOSCOPY;  Surgeon: Carol Ada, MD;  Location: [place] [facility];  Service: Endoscopy;  Laterality: Left;  . ESOPHAGOGASTRODUODENOSCOPY N/A 04/03/2017   Procedure: ESOPHAGOGASTRODUODENOSCOPY (EGD);  Surgeon: Carol Ada, MD;  Location: [facility];  Service: Endoscopy;  Laterality: N/A;  . ESOPHAGOGASTRODUODENOSCOPY (EGD) WITH PROPOFOL N/A 04/12/2017   Procedure: ESOPHAGOGASTRODUODENOSCOPY (EGD) WITH PROPOFOL;  Surgeon:  Milus Banister, MD;  Location: [facility] ENDOSCOPY;  Service: Endoscopy;  Laterality: N/A;  . IR ANGIOGRAM SELECTIVE EACH ADDITIONAL  VESSEL  04/12/2017  . IR ANGIOGRAM SELECTIVE EACH ADDITIONAL VESSEL  04/13/2017  . IR ANGIOGRAM SELECTIVE EACH ADDITIONAL VESSEL  04/13/2017  . IR ANGIOGRAM SELECTIVE EACH ADDITIONAL VESSEL  04/13/2017  . IR ANGIOGRAM VISCERAL SELECTIVE  04/12/2017  . IR ANGIOGRAM VISCERAL SELECTIVE  04/13/2017  . IR EMBO ART  VEN HEMORR LYMPH EXTRAV  INC GUIDE ROADMAPPING  04/12/2017  . IR EMBO ART  VEN HEMORR LYMPH EXTRAV  INC GUIDE ROADMAPPING  04/13/2017  . IR FLUORO GUIDE CV LINE RIGHT  04/13/2017  . IR IVC FILTER PLMT / S&I /IMG GUID/MOD SED  04/12/2017  . IR US GUIDE VASC ACCESS RIGHT  04/12/2017  . IR US GUIDE VASC ACCESS RIGHT  04/13/2017  . IR US GUIDE VASC ACCESS RIGHT  04/13/2017  . LEG SURGERY  1974   Blood Clot Removal     Family History  Problem Relation Age of Onset  . Heart disease Father      Social History:  Lives with brother. She was working full time and was independent without AD. She reports that she has been smoking 1/2 PPD for 40 years.  She has never used smokeless tobacco. She reports that she does not drink alcohol or use drugs.    Allergies: No Known Allergies    Medications Prior to Admission  Medication Sig Dispense Refill  . aspirin 81 MG tablet Take 81 mg by mouth daily.    . cholecalciferol (VITAMIN D) 1000 units tablet Take 1,000 Units by mouth 2 (two) times daily.    [name] lisinopril-hydrochlorothiazide (PRINZIDE,ZESTORETIC) 20-12.5 MG tablet Take 1 tablet by mouth daily.    . simvastatin (ZOCOR) 20 MG tablet Take 1 tablet by mouth daily.  1    Home: Home Living Family/patient expects to be discharged to:: Private residence Living Arrangements: Other relatives(brother) Available Help at Discharge: Family, Available PRN/intermittently Type of Home: House Home Access: Level entry Home Layout: One [place]: None  Functional History: Prior Function Level of Independence: Independent Comments: works Functional Status:  Mobility: Bed Mobility Overal bed mobility: Needs  Assistance Bed Mobility: Rolling Rolling: +2 for physical assistance, Mod assist Supine to sit: Min assist General bed mobility comments: pt only able to tolerate rolling this session for bed pan placement; pt with large, liquid BM that was dark with red as well (pt's RN was notified); pt very fatigued after rolling bilaterally for pericare and repositioning Transfers Overall transfer level: Needs assistance Equipment used: Rolling walker (2 wheeled) Transfers: Sit to/from Stand Sit to Stand: Min guard General transfer comment: Cues for hand placement to and from seated surface.   Ambulation/Gait Ambulation/Gait assistance: Min guard Ambulation Distance (Feet): 120 Feet Assistive device: Rolling walker (2 wheeled) Gait Pattern/deviations: Step-through pattern, Decreased stance time - right, Decreased stride length, Antalgic General Gait Details: Slightly guarded, antalgic gait due to RLE soreness. Used walker to decrease discomfort of RLE. Amb on 2L but poor waveform so unable to obtain accurate SPo2 reading.   Gait velocity: decr Gait velocity interpretation: Below normal speed for age/gender    ADL:    Cognition: Cognition Overall Cognitive Status: Within Functional Limits for tasks assessed Orientation Level: Oriented X4 Cognition Arousal/Alertness: Awake/alert Behavior During Therapy: WFL for tasks assessed/performed Overall Cognitive Status: Within Functional Limits for tasks assessed   Blood pressure (!) 161/145, pulse (!) 50, temperature 99.1 F (37.3 C),  temperature source Oral, resp. rate (!) 21, height 5\' 6"  (1.676 m), weight 85.4 kg (188 lb 4.4 oz), SpO2 100 %. Physical Exam  Nursing note and vitals reviewed. Constitutional: She is oriented to person, place, and time. She appears well-developed and well-nourished. She has a sickly appearance. No distress. Nasal cannula in place.  HENT:  Head: Normocephalic and atraumatic.  Mouth/Throat: Oropharynx is clear and  moist.  Eyes: Conjunctivae and EOM are normal. Pupils are equal, round, and reactive to light.  Cardiovascular: Normal rate and regular rhythm.  No murmur heard. Respiratory: Effort normal. She has decreased breath sounds. She has no wheezes.  GI: Soft. Bowel sounds are normal. She exhibits no distension.  Musculoskeletal: She exhibits edema.  Dependent edema bilateral forearms and BLE. Had difficulty activating LLE.   Neurological: She is alert and oriented to person, place, and time.  Dysphonia noted. Able to follow simple motor commands and answer biographical questions without difficulty.   Skin: Skin is warm and dry. She is not diaphoretic.  Motor strength is 3/5 in bilateral deltoid 4 bilateral biceps triceps grip 2- bilateral hip flexors 3 bilateral knee extensors 3 bilateral ankle dorsiflexors Sensation mildly diminished in the right foot compared to the left foot.  Intact to light touch in both hands. Patient has bilateral dorsum of the hand edema as well as forearm edema as well as pretibial edema bilaterally  Results for orders placed or performed during the hospital encounter of 04/02/17 (from the past 24 hour(s))  Hemoglobin and hematocrit, blood     Status: Abnormal   Collection Time: 04/15/17  7:37 PM  Result Value Ref Range   Hemoglobin 8.4 (L) 12.0 - 15.0 g/dL   HCT 25.9 (L) 36.0 - 46.0 %  Glucose, capillary     Status: None   Collection Time: 04/15/17  7:47 PM  Result Value Ref Range   Glucose-Capillary 77 65 - 99 mg/dL   Comment 1 Capillary Specimen    Comment 2 Notify RN   Hemoglobin and hematocrit, blood     Status: Abnormal   Collection Time: 04/15/17 11:34 PM  Result Value Ref Range   Hemoglobin 7.7 (L) 12.0 - 15.0 g/dL   HCT 23.4 (L) 36.0 - 46.0 %  Glucose, capillary     Status: None   Collection Time: 04/16/17 12:16 AM  Result Value Ref Range   Glucose-Capillary 79 65 - 99 mg/dL   Comment 1 Capillary Specimen    Comment 2 Notify RN   CBC     Status:  Abnormal   Collection Time: 04/16/17  4:00 AM  Result Value Ref Range   WBC 6.9 4.0 - 10.5 K/uL   RBC 2.59 (L) 3.87 - 5.11 MIL/uL   Hemoglobin 7.9 (L) 12.0 - 15.0 g/dL   HCT 24.7 (L) 36.0 - 46.0 %   MCV 95.4 78.0 - 100.0 fL   MCH 30.5 26.0 - 34.0 pg   MCHC 32.0 30.0 - 36.0 g/dL   RDW 18.8 (H) 11.5 - 15.5 %   Platelets 52 (L) 150 - 400 K/uL  Comprehensive metabolic panel     Status: Abnormal   Collection Time: 04/16/17  4:00 AM  Result Value Ref Range   Sodium 147 (H) 135 - 145 mmol/L   Potassium 2.8 (L) 3.5 - 5.1 mmol/L   Chloride 105 101 - 111 mmol/L   CO2 29 22 - 32 mmol/L   Glucose, Bld 86 65 - 99 mg/dL   BUN 18 6 - 20 mg/dL  Creatinine, Ser 1.54 (H) 0.44 - 1.00 mg/dL   Calcium 7.5 (L) 8.9 - 10.3 mg/dL   Total Protein 3.5 (L) 6.5 - 8.1 g/dL   Albumin 1.5 (L) 3.5 - 5.0 g/dL   AST 93 (H) 15 - 41 U/L   ALT 152 (H) 14 - 54 U/L   Alkaline Phosphatase 40 38 - 126 U/L   Total Bilirubin 0.8 0.3 - 1.2 mg/dL   GFR calc non Af Amer 35 (L) >60 mL/min   GFR calc Af Amer 40 (L) >60 mL/min   Anion gap 13 5 - 15  Glucose, capillary     Status: None   Collection Time: 04/16/17  4:05 AM  Result Value Ref Range   Glucose-Capillary 74 65 - 99 mg/dL   Comment 1 Capillary Specimen    Comment 2 Notify RN   Hemoglobin and hematocrit, blood     Status: Abnormal   Collection Time: 04/16/17  7:46 AM  Result Value Ref Range   Hemoglobin 8.0 (L) 12.0 - 15.0 g/dL   HCT 24.9 (L) 36.0 - 46.0 %  Glucose, capillary     Status: None   Collection Time: 04/16/17  8:21 AM  Result Value Ref Range   Glucose-Capillary 95 65 - 99 mg/dL   Comment 1 Capillary Specimen    Comment 2 Notify RN   Glucose, capillary     Status: None   Collection Time: 04/16/17 11:50 AM  Result Value Ref Range   Glucose-Capillary 96 65 - 99 mg/dL   Comment 1 Capillary Specimen    Comment 2 Notify RN   Hemoglobin and hematocrit, blood     Status: Abnormal   Collection Time: 04/16/17  3:45 PM  Result Value Ref Range    Hemoglobin 8.1 (L) 12.0 - 15.0 g/dL   HCT 25.8 (L) 36.0 - 46.0 %  Glucose, capillary     Status: None   Collection Time: 04/16/17  3:53 PM  Result Value Ref Range   Glucose-Capillary 86 65 - 99 mg/dL   Comment 1 Capillary Specimen    Dg Chest Port 1 View  Result Date: 04/15/2017 CLINICAL DATA:  Ventilator dependent respiratory failure. Atrial fibrillation with rapid ventricular response. Follow-up bilateral pleural effusions. Recent transcatheter embolization for gastric bleeding. EXAM: PORTABLE CHEST 1 VIEW COMPARISON:  04/14/2017, 04/12/2017 and earlier, including CT chest 04/07/2017. FINDINGS: Endotracheal tube tip in satisfactory position projecting approximately 5 cm above the carina. Right jugular central venous catheter projects tip at or near the cavoatrial junction, unchanged. Nasogastric tube courses below the diaphragm into the stomach. Embolization coils in the left upper quadrant of the abdomen. Persistent moderate to large bilateral pleural effusions, right greater than left, unchanged. Associated dense consolidation in the lower lobes, unchanged. Mild pulmonary venous hypertension without overt edema. No new pulmonary parenchymal abnormalities. IMPRESSION: 1.  Support apparatus satisfactory. 2. Stable bilateral pleural effusions, right greater than left, and associated dense passive atelectasis and/or pneumonia in the lower lobes. 3. No new abnormalities. Electronically Signed   By: Evangeline Dakin M.D.   On: 04/15/2017 09:09    Assessment/Plan: Diagnosis: Critical illness myopathy with lower extremity greater than upper extremity proximal weakness. 1. Does the need for close, 24 hr/day medical supervision in concert with the patient's rehab needs make it unreasonable for this patient to be served in a less intensive setting? Yes 2. Co-Morbidities requiring supervision/potential complications: History of GI bleed, DVT, atrial fibrillation with RVR, hypokalemia, anemia 3. Due to  bladder management, bowel management, safety,  skin/wound care, disease management, medication administration, pain management and patient education, does the patient require 24 hr/day rehab nursing? Yes 4. Does the patient require coordinated care of a physician, rehab nurse, PT (1-2 hrs/day, 5 days/week), OT (1-2 hrs/day, 5 days/week) and SLP (.5-1 hrs/day, 5 days/week) to address physical and functional deficits in the context of the above medical diagnosis(es)? Yes Addressing deficits in the following areas: balance, endurance, locomotion, strength, transferring, bowel/bladder control, bathing, dressing, feeding, grooming, toileting, speech, swallowing and Dysphonia 5. Can the patient actively participate in an intensive therapy program of at least 3 hrs of therapy per day at least 5 days per week? No 6. The potential for patient to make measurable gains while on inpatient rehab is Good once she gains some endurance 7. Anticipated functional outcomes upon discharge from inpatient rehab are supervision and min assist  with PT, supervision and min assist with OT, modified independent with SLP. 8. Estimated rehab length of stay to reach the above functional goals is: Likely 2-3 weeks, pending PT, OT re-eval's 9. Anticipated D/C setting: Home 10. Anticipated post D/C treatments: [place] therapy 11. Overall Rehab/Functional Prognosis: good  RECOMMENDATIONS: This patient's condition is appropriate for continued rehabilitative care in the following setting: CIR once able to tolerate therapy Patient has agreed to participate in recommended program. Yes Note that insurance prior authorization may be required for reimbursement for recommended care.  Comment: Appears to have some fluid overload , diuresis may aid with mobility  Charlett Blake M.D. [facility] FAAPM&R (Sports Med, Neuromuscular Med) Diplomate Am Board of [place], PA-C 04/16/2017

## 2017-04-16 NOTE — Progress Notes (Signed)
Pt w/ stable H and H. BM large gelatinous, dark red mass.

## 2017-04-16 NOTE — Progress Notes (Signed)
eLink Physician-Brief Progress Note Patient Name: Kayla Wyatt DOB: 18-Nov-1955 MRN: [id number]   Date of Service  04/16/2017  HPI/Events of Note    eICU Interventions  Hypokalemia -repleted      Intervention Category Intermediate Interventions: Electrolyte abnormality - evaluation and management  Seletha Zimmermann V. Aubreyanna Dorrough 04/16/2017, 5:26 AM

## 2017-04-16 NOTE — Progress Notes (Signed)
Subjective: Complaining of sore legs.  Objective: Vital signs in last 24 hours: Temp:  [97.4 F (36.3 C)-98.5 F (36.9 C)] 98.5 F (36.9 C) (02/07 1149) Pulse Rate:  [53-101] 70 (02/07 0800) Resp:  [14-23] 20 (02/07 0800) BP: (106-147)/(43-104) 106/85 (02/07 0800) SpO2:  [96 %-100 %] 99 % (02/07 0800) Weight:  [85.4 kg (188 lb 4.4 oz)] 85.4 kg (188 lb 4.4 oz) (02/07 0500) Last BM Date: 04/16/17  Intake/Output from previous day: 02/06 0701 - 02/07 0700 In: 209.4 [I.V.:209.4] Out: 4250 [Urine:4150; Stool:100] Intake/Output this shift: Total I/O In: 10 [I.V.:10] Out: 125 [Urine:125]  General appearance: alert and no distress GI: soft, non-tender; bowel sounds normal; no masses,  no organomegaly  Lab Results: Recent Labs    04/15/17 0507 04/15/17 0755  04/15/17 2334 04/16/17 0400 04/16/17 0746  WBC 12.3* 12.4*  --   --  6.9  --   HGB 8.5* 8.8*   < > 7.7* 7.9* 8.0*  HCT 26.3* 27.5*   < > 23.4* 24.7* 24.9*  PLT 54* 57*  --   --  52*  --    < > = values in this interval not displayed.   BMET Recent Labs    04/14/17 0044 04/15/17 0507 04/16/17 0400  NA 146* 146* 147*  K 3.8 3.6 2.8*  CL 103 105 105  CO2 31 29 29   GLUCOSE 131* 92 86  BUN 16 18 18   CREATININE 1.59* 1.60* 1.54*  CALCIUM 7.2* 7.5* 7.5*   LFT Recent Labs    04/16/17 0400  PROT 3.5*  ALBUMIN 1.5*  AST 93*  ALT 152*  ALKPHOS 40  BILITOT 0.8   PT/INR Recent Labs    04/14/17 0920 04/15/17 0755  LABPROT 15.8* 15.0  INR 1.27 1.19   Hepatitis Panel No results for input(s): HEPBSAG, HCVAB, HEPAIGM, HEPBIGM in the last 72 hours. C-Diff No results for input(s): CDIFFTOX in the last 72 hours. Fecal Lactopherrin No results for input(s): FECLLACTOFRN in the last 72 hours.  Studies/Results: Dg Chest Port 1 View  Result Date: 04/15/2017 CLINICAL DATA:  Ventilator dependent respiratory failure. Atrial fibrillation with rapid ventricular response. Follow-up bilateral pleural effusions. Recent  transcatheter embolization for gastric bleeding. EXAM: PORTABLE CHEST 1 VIEW COMPARISON:  04/14/2017, 04/12/2017 and earlier, including CT chest 04/07/2017. FINDINGS: Endotracheal tube tip in satisfactory position projecting approximately 5 cm above the carina. Right jugular central venous catheter projects tip at or near the cavoatrial junction, unchanged. Nasogastric tube courses below the diaphragm into the stomach. Embolization coils in the left upper quadrant of the abdomen. Persistent moderate to large bilateral pleural effusions, right greater than left, unchanged. Associated dense consolidation in the lower lobes, unchanged. Mild pulmonary venous hypertension without overt edema. No new pulmonary parenchymal abnormalities. IMPRESSION: 1.  Support apparatus satisfactory. 2. Stable bilateral pleural effusions, right greater than left, and associated dense passive atelectasis and/or pneumonia in the lower lobes. 3. No new abnormalities. Electronically Signed   By: Evangeline Dakin M.D.   On: 04/15/2017 09:09    Medications:  Scheduled: . arformoterol  15 mcg Nebulization BID  . budesonide (PULMICORT) nebulizer solution  0.5 mg Nebulization BID  . Chlorhexidine Gluconate Cloth  6 each Topical Daily  . furosemide  60 mg Intravenous BID  . Gerhardt's butt cream   Topical TID  . insulin aspart  2-6 Units Subcutaneous Q4H  . mouth rinse  15 mL Mouth Rinse BID  . pantoprazole  40 mg Oral BID  . potassium chloride  40  mEq Oral BID  . sodium chloride flush  10-40 mL Intracatheter Q12H  . sodium chloride flush  3 mL Intravenous Q12H   Continuous: . sodium chloride    . potassium chloride Stopped (04/12/17 2246)    Assessment/Plan: 1) Upper GI bleed. 2) Anemia. 3) Mediastinal mass.   She is stable.  No overt evidence of any GI bleeding.  Plan: 1) Continue supportive care. 2) Follow HGB and transfuse as necessary.  LOS: 14 days   Maitlyn Penza D 04/16/2017, 1:30 PM

## 2017-04-16 NOTE — Evaluation (Signed)
Physical Therapy Evaluation Patient Details Name: Kayla Wyatt MRN: [id number] DOB: January 02, 1956 Today's Date: 04/16/2017   History of Present Illness  Pt is a 62 y/o female admitted on 04/02/17 secondary to abdominal pain and diarrhea. Pt found to have anemia secondary to a GI bleed. Pt's course complicated due to an acute R LE DVT and now s/p IVC filter placement on 2/3. Pt hypotensive and tachycardic requiring intubation from 2/3 to 2/6 with an emergent EGD. PMH including but not limited to HTN.    Clinical Impression  Pt presented supine in bed with HOB elevated, awake and willing to participate in therapy session. Prior to admission, pt was independent with all functional mobility and ADLs. She was still working as well. Pt lives with her brother in a single level home. Pt currently very limited following extubation yesterday. She is also limited secondary to pain in bilateral LEs. Pt currently requires two person physical assistance for bed mobility. Pt will require post-acute rehab prior to returning home with family and is an excellent candidate for inpatient rehab. PT will continue to follow acutely for mobility progression as tolerated.     Follow Up Recommendations CIR;Supervision/Assistance - 24 hour    Equipment Recommendations  None recommended by PT;Other (comment)(defer to next venue)    Recommendations for Other Services Rehab consult     Precautions / Restrictions Precautions Precautions: Fall Restrictions Weight Bearing Restrictions: No      Mobility  Bed Mobility Overal bed mobility: Needs Assistance Bed Mobility: Rolling Rolling: +2 for physical assistance;Mod assist         General bed mobility comments: pt only able to tolerate rolling this session for bed pan placement; pt with large, liquid BM that was dark with red as well (pt's RN was notified); pt very fatigued after rolling bilaterally for pericare and repositioning  Transfers                     Ambulation/Gait                Stairs            Wheelchair Mobility    Modified Rankin (Stroke Patients Only)       Balance                                             Pertinent Vitals/Pain Pain Assessment: Faces Faces Pain Scale: Hurts whole lot Pain Location: bilateral LEs (L>R) Pain Descriptors / Indicators: Sore;Grimacing Pain Intervention(s): Monitored during session;Repositioned    Home Living Family/patient expects to be discharged to:: Private residence Living Arrangements: Other relatives(brother) Available Help at Discharge: Family;Available PRN/intermittently Type of Home: House Home Access: Level entry     Home Layout: One level Home Equipment: None      Prior Function Level of Independence: Independent         Comments: works     Journalist, newspaper        Extremity/Trunk Assessment   Upper Extremity Assessment Upper Extremity Assessment: Overall WFL for tasks assessed    Lower Extremity Assessment Lower Extremity Assessment: Generalized weakness       Communication   Communication: No difficulties  Cognition Arousal/Alertness: Awake/alert Behavior During Therapy: WFL for tasks assessed/performed Overall Cognitive Status: Within Functional Limits for tasks assessed  General Comments      Exercises     Assessment/Plan    PT Assessment Patient needs continued PT services  PT Problem List Decreased strength;Decreased range of motion;Decreased activity tolerance;Decreased balance;Decreased mobility;Decreased coordination;Decreased knowledge of use of DME;Decreased safety awareness;Decreased knowledge of precautions;Cardiopulmonary status limiting activity;Pain       PT Treatment Interventions DME instruction;Gait training;Stair training;Functional mobility training;Therapeutic exercise;Therapeutic activities;Balance training;Neuromuscular  re-education;Patient/family education    PT Goals (Current goals can be found in the Care Plan section)  Acute Rehab PT Goals Patient Stated Goal: return to independence PT Goal Formulation: With patient Time For Goal Achievement: 04/30/17 Potential to Achieve Goals: Good    Frequency Min 3X/week   Barriers to discharge        Co-evaluation               AM-PAC PT "6 Clicks" Daily Activity  Outcome Measure Difficulty turning over in bed (including adjusting bedclothes, sheets and blankets)?: Unable Difficulty moving from lying on back to sitting on the side of the bed? : Unable Difficulty sitting down on and standing up from a chair with arms (e.g., wheelchair, bedside commode, etc,.)?: Unable Help needed moving to and from a bed to chair (including a wheelchair)?: A Lot Help needed walking in hospital room?: Total Help needed climbing 3-5 steps with a railing? : Total 6 Click Score: 7    End of Session Equipment Utilized During Treatment: Oxygen Activity Tolerance: Patient limited by fatigue;Patient limited by pain Patient left: in bed;with call bell/phone within reach;with SCD's reapplied Nurse Communication: Mobility status PT Visit Diagnosis: Other abnormalities of gait and mobility (R26.89);Pain Pain - Right/Left: (bilateral) Pain - part of body: Leg    Time: 1100-1125 PT Time Calculation (min) (ACUTE ONLY): 25 min   Charges:   PT Evaluation $PT Eval Moderate Complexity: 1 Mod PT Treatments $Therapeutic Activity: 8-22 mins   PT G Codes:        [place], PT, DPT [place] 04/16/2017, 12:19 PM

## 2017-04-16 NOTE — Progress Notes (Signed)
Nutrition Follow-up  DOCUMENTATION CODES:   Not applicable  INTERVENTION:    Boost Breeze po TID, each supplement provides 250 kcal and 9 grams of protein  NUTRITION DIAGNOSIS:   Inadequate oral intake related to altered GI function as evidenced by (clear liquid diet).  Ongoing  GOAL:   Patient will meet greater than or equal to 90% of their needs  Unmet  MONITOR:   Diet advancement, PO intake, Supplement acceptance  ASSESSMENT:   Cierra Rothgeb is a 62 y.o. female past medical history significant for high blood pressure high cholesterol presents with abdominal pain and diarrhea for about 6 days.  Patient states she had taken multiple doses daily of NSAIDs to relieve her pain.  Had one episode of vomiting the day before she came to the hospital.  And then had diarrhea with black stool the day she came to the emergency room.  Patient has no history of blood transfusion.  No history of GI bleed.  No history of ulcers.  Extubated 10/6. GI team following. H/H stable with no overt evidence of GI bleeding. Diet has been advanced to clear liquids. Labs reviewed. Sodium 147 (H), potassium 2.8 (L) CBG's: [phone] Medications reviewed and include KCl and Lasix.   Diet Order:  Diet clear liquid Room service appropriate? Yes; Fluid consistency: Thin  EDUCATION NEEDS:   Education needs have been addressed  Skin:  Skin Assessment: Skin Integrity Issues: Skin Integrity Issues:: Incisions, Other (Comment) Incisions: groin Other: MASD to groin, perineum, buttocks  Last BM:  2/7  Height:   Ht Readings from Last 1 Encounters:  04/12/17 5\' 6"  (1.676 m)    Weight:   Wt Readings from Last 1 Encounters:  04/16/17 188 lb 4.4 oz (85.4 kg)   04/13/17 185 lb 13.6 oz (84.3 kg)   04/08/17 155 lb 10.3 oz (70.6 kg)   04/03/17 137 lb 2 oz (62.2 kg)   Weight up with I/O + 30.5 L since admission  Ideal Body Weight:  52.3 kg  BMI: 22 using admission weight  Estimated  Nutritional Needs:   Kcal:  1600-1800  Protein:  80-90 gm  Fluid:  1.6-1.8 L   Molli Barrows, RD, LDN, CNSC Pager [phone] After Hours Pager [phone]

## 2017-04-17 DIAGNOSIS — J9600 Acute respiratory failure, unspecified whether with hypoxia or hypercapnia: Secondary | ICD-10-CM

## 2017-04-17 DIAGNOSIS — K264 Chronic or unspecified duodenal ulcer with hemorrhage: Secondary | ICD-10-CM

## 2017-04-17 LAB — COMPREHENSIVE METABOLIC PANEL
ALT: 125 U/L — ABNORMAL HIGH (ref 14–54)
AST: 66 U/L — ABNORMAL HIGH (ref 15–41)
Albumin: 1.7 g/dL — ABNORMAL LOW (ref 3.5–5.0)
Alkaline Phosphatase: 43 U/L (ref 38–126)
Anion gap: 12 (ref 5–15)
BUN: 15 mg/dL (ref 6–20)
CO2: 31 mmol/L (ref 22–32)
Calcium: 7.8 mg/dL — ABNORMAL LOW (ref 8.9–10.3)
Chloride: 103 mmol/L (ref 101–111)
Creatinine, Ser: 1.38 mg/dL — ABNORMAL HIGH (ref 0.44–1.00)
GFR calc Af Amer: 46 mL/min — ABNORMAL LOW (ref >60)
GFR calc non Af Amer: 39 mL/min — ABNORMAL LOW (ref >60)
Glucose, Bld: 91 mg/dL (ref 65–99)
Potassium: 2.8 mmol/L — ABNORMAL LOW (ref 3.5–5.1)
Sodium: 146 mmol/L — ABNORMAL HIGH (ref 135–145)
Total Bilirubin: 0.6 mg/dL (ref 0.3–1.2)
Total Protein: 3.8 g/dL — ABNORMAL LOW (ref 6.5–8.1)

## 2017-04-17 LAB — CBC
HCT: 26.9 % — ABNORMAL LOW (ref 36.0–46.0)
Hemoglobin: 8.5 g/dL — ABNORMAL LOW (ref 12.0–15.0)
MCH: 30.7 pg (ref 26.0–34.0)
MCHC: 31.6 g/dL (ref 30.0–36.0)
MCV: 97.1 fL (ref 78.0–100.0)
Platelets: 64 10*3/uL — ABNORMAL LOW (ref 150–400)
RBC: 2.77 MIL/uL — ABNORMAL LOW (ref 3.87–5.11)
RDW: 19.3 % — ABNORMAL HIGH (ref 11.5–15.5)
WBC: 5.4 10*3/uL (ref 4.0–10.5)

## 2017-04-17 LAB — HEMOGLOBIN AND HEMATOCRIT, BLOOD
HCT: 27.8 % — ABNORMAL LOW (ref 36.0–46.0)
HCT: 31.7 % — ABNORMAL LOW (ref 36.0–46.0)
Hemoglobin: 8.8 g/dL — ABNORMAL LOW (ref 12.0–15.0)
Hemoglobin: 9.9 g/dL — ABNORMAL LOW (ref 12.0–15.0)

## 2017-04-17 LAB — GLUCOSE, CAPILLARY
Glucose-Capillary: 101 mg/dL — ABNORMAL HIGH (ref 65–99)
Glucose-Capillary: 108 mg/dL — ABNORMAL HIGH (ref 65–99)
Glucose-Capillary: 113 mg/dL — ABNORMAL HIGH (ref 65–99)
Glucose-Capillary: 115 mg/dL — ABNORMAL HIGH (ref 65–99)
Glucose-Capillary: 144 mg/dL — ABNORMAL HIGH (ref 65–99)
Glucose-Capillary: 146 mg/dL — ABNORMAL HIGH (ref 65–99)
Glucose-Capillary: 89 mg/dL (ref 65–99)
Glucose-Capillary: 90 mg/dL (ref 65–99)

## 2017-04-17 MED ORDER — POTASSIUM CHLORIDE CRYS ER 20 MEQ PO TBCR: 40.0000 meq | EXTENDED_RELEASE_TABLET | Freq: Two times a day (BID) | ORAL | Status: DC

## 2017-04-17 NOTE — Progress Notes (Signed)
Inpatient Rehabilitation  Met with patient and family member at bedside to discuss team's recommendation for IP Rehab.  Shared booklets, insurance verification letter, and answered initial questions.  Plan to follow for timing of medical readiness, therapy tolerance, insurance authorization, and IP Rehab bed availability.  Given that insurance carrier is [employer] cannot initiate authorization today; therefore, will follow up Monday.  Call if questions.    Carmelia Roller., CCC/SLP Admission Coordinator  [facility]  Cell [phone]

## 2017-04-17 NOTE — NC FL2 (Signed)
[place] MEDICAID FL2 LEVEL OF CARE SCREENING TOOL     IDENTIFICATION  Patient Name: Kayla Wyatt Birthdate: [birth year] Sex: female Admission Date (Current Location): 04/02/2017  [facility] and [place] Number:  Herbalist and Address:  [facility], [place] [address], [place], [place] [address]      Provider Number: [id number]  Attending Physician Name and Address:  Alma Friendly, MD  Relative Name and Phone Number:       Current Level of Care: Hospital Recommended Level of Care: [place] Prior Approval Number:    Date Approved/Denied:   PASRR Number: [id number]  Discharge Plan: SNF    Current Diagnoses: Patient Active Problem List   Diagnosis Date Noted  . Encounter for intubation   . Respiratory failure ([place])   . Persistent atrial fibrillation with rapid ventricular response ([place]) 04/07/2017  . Gastrointestinal hemorrhage with melena   . Lower abdominal pain of unknown etiology   . Abnormal computed tomography angiography (CTA) of abdomen and pelvis   . Acute blood loss anemia 04/02/2017    Orientation RESPIRATION BLADDER Height & Weight     Self, Time, Situation, Place  O2(Nasal cannula 2L) Continent, External catheter Weight: 81.7 kg (180 lb 1.9 oz) Height:  5\' 3"  (160 cm)  BEHAVIORAL SYMPTOMS/MOOD NEUROLOGICAL BOWEL NUTRITION STATUS      Continent Diet(Please see DC Summary)  AMBULATORY STATUS COMMUNICATION OF NEEDS Skin   Limited Assist Verbally Surgical wounds(Closed incision on groin)                       Personal Care Assistance Level of Assistance  Bathing, Feeding, Dressing Bathing Assistance: Maximum assistance Feeding assistance: Limited assistance Dressing Assistance: Limited assistance     Functional Limitations Info             SPECIAL CARE FACTORS FREQUENCY  PT (By licensed PT), OT (By licensed OT)     PT Frequency: 5x/week OT Frequency: 3x/week             Contractures      Additional Factors Info  Code Status, Allergies, Insulin Sliding Scale Code Status Info: Full Allergies Info: NKA   Insulin Sliding Scale Info: Every 4 hours       Current Medications (04/17/2017):  This is the current hospital active medication list Current Facility-Administered Medications  Medication Dose Route Frequency Provider Last Rate Last Dose  . 0.9 %  sodium chloride infusion  250 mL Intravenous PRN Omar Person, NP      . acetaminophen (TYLENOL) tablet 650 mg  650 mg Oral Q6H PRN Elwin Mocha, MD   650 mg at 04/16/17 4627   Or  . acetaminophen (TYLENOL) suppository 650 mg  650 mg Rectal Q6H PRN Elwin Mocha, MD      . arformoterol [facility]) nebulizer solution 15 mcg  15 mcg Nebulization BID Marjie Skiff, MD   15 mcg at 04/17/17 0857  . budesonide (PULMICORT) nebulizer solution 0.5 mg  0.5 mg Nebulization BID Diallo, Abdoulaye, MD   0.5 mg at 04/17/17 0857  . Chlorhexidine Gluconate Cloth 2 % PADS 6 each  6 each Topical Daily Juanito Doom, MD   6 each at 04/17/17 [phone]  . feeding supplement (BOOST / RESOURCE BREEZE) liquid 1 Container  1 Container Oral TID BM Juanito Doom, MD   1 Container at 04/17/17 0913  . furosemide (LASIX) injection 60 mg  60 mg Intravenous BID Riccio,  Angela C, DO   60 mg at 04/16/17 2021  . Gerhardt's butt cream   Topical TID Aline August, MD      . insulin aspart (novoLOG) injection 2-6 Units  2-6 Units Subcutaneous Q4H Omar Person, NP   2 Units at 04/14/17 0334  . lidocaine (PF) (XYLOCAINE) 1 % injection    PRN Jacqulynn Cadet, MD   15 mL at 04/13/17 1738  . nicotine (NICODERM CQ - dosed in mg/24 hours) patch 14 mg  14 mg Transdermal Daily PRN Elwin Mocha, MD      . ondansetron [facility]) tablet 4 mg  4 mg Oral Q6H PRN Elwin Mocha, MD       Or  . ondansetron [facility]) injection 4 mg  4 mg Intravenous Q6H PRN Elwin Mocha, MD      . pantoprazole (PROTONIX) EC tablet 40 mg  40  mg Oral BID Lucila Maine C, DO   40 mg at 04/17/17 0914  . potassium chloride SA (K-DUR,KLOR-CON) CR tablet 40 mEq  40 mEq Oral BID Lucila Maine C, DO   40 mEq at 04/17/17 0914  . sodium chloride flush (NS) 0.9 % injection 10-40 mL  10-40 mL Intracatheter PRN Juanito Doom, MD   30 mL at 04/17/17 0843  . sodium chloride flush (NS) 0.9 % injection 3 mL  3 mL Intravenous Q12H Elwin Mocha, MD   3 mL at 04/17/17 0915     Discharge Medications: Please see discharge summary for a list of discharge medications.  Relevant Imaging Results:  Relevant Lab Results:   Additional Information SSN: [place], [place]

## 2017-04-17 NOTE — Evaluation (Signed)
Occupational Therapy Evaluation Patient Details Name: Kayla Wyatt MRN: [id number] DOB: 1955-04-16 Today's Date: 04/17/2017    History of Present Illness Pt is a 62 y/o female admitted on 04/02/17 secondary to abdominal pain and diarrhea. Pt found to have anemia secondary to a GI bleed. Pt's course complicated due to an acute R LE DVT and now s/p IVC filter placement on 2/3. Pt hypotensive and tachycardic requiring intubation from 2/3 to 2/6 with an emergent EGD. PMH including but not limited to HTN.   Clinical Impression   Pt admitted with the above diagnoses and presents with below problem list. Pt will benefit from continued acute OT to address the below listed deficits and maximize independence with basic ADLs prior to d/c to next venue. PTA pt was independent with ADLs, works. Pt is currently max A with most ADLs, +2 at least for safety with functional transfers. Given significant decline in functional independence with ADLs, feel pt would greatly benefit from continued therapy in inpatient rehab setting.       Follow Up Recommendations  CIR    Equipment Recommendations  Other (comment)(defer to next venue)    Recommendations for Other Services Rehab consult     Precautions / Restrictions Precautions Precautions: Fall Restrictions Weight Bearing Restrictions: No      Mobility Bed Mobility Overal bed mobility: Needs Assistance Bed Mobility: Rolling;Sidelying to Sit Rolling: Min guard Sidelying to sit: Max assist       General bed mobility comments: HOB partially elevated. assist to powerup trunk to EOB position  Transfers Overall transfer level: Needs assistance Equipment used: Rolling walker (2 wheeled) Transfers: Sit to/from [facility] Sit to Stand: Mod assist Stand pivot transfers: Mod assist;+2 physical assistance       General transfer comment: Pt verbalized she felt she could not walk but was ultimately successful with pivotol steps with  assist provided for balance.     Balance Overall balance assessment: Needs assistance Sitting-balance support: Bilateral upper extremity supported;Feet supported Sitting balance-Leahy Scale: Fair Sitting balance - Comments: sat EOB about 3 minutes   Standing balance support: Bilateral upper extremity supported;During functional activity Standing balance-Leahy Scale: Poor                             ADL either performed or assessed with clinical judgement   ADL Overall ADL's : Needs assistance/impaired Eating/Feeding: Set up;Sitting   Grooming: Min guard;Sitting;Minimal assistance   Upper Body Bathing: Maximal assistance;Sitting   Lower Body Bathing: Maximal assistance;+2 for safety/equipment   Upper Body Dressing : Maximal assistance;Sitting   Lower Body Dressing: Maximal assistance;+2 for safety/equipment;Sit to/from stand   Toilet Transfer: Moderate assistance;+2 for safety/equipment;Stand-pivot   Toileting- Clothing Manipulation and Hygiene: Maximal assistance;Sit to/from stand;+2 for safety/equipment   Tub/ Shower Transfer: Maximal assistance;+2 for safety/equipment;Stand-pivot;Rolling walker     General ADL Comments: Pt received in bed with nursing students assisting with bathing. Pt then completed bed mobility, sat EOB aobut 2 minutes then took pivotol steps from EOB>recliner. Pt very unsteady on feet and fearful of falling.      Vision         Perception     Praxis      Pertinent Vitals/Pain Pain Assessment: Faces Faces Pain Scale: Hurts even more Pain Location: BLE Pain Descriptors / Indicators: Sore;Grimacing Pain Intervention(s): Limited activity within patient's tolerance;Monitored during session;Repositioned     Hand Dominance     Extremity/Trunk Assessment Upper Extremity Assessment Upper  Extremity Assessment: Overall WFL for tasks assessed;Generalized weakness   Lower Extremity Assessment Lower Extremity Assessment: Defer to PT  evaluation       Communication Communication Communication: No difficulties   Cognition Arousal/Alertness: Awake/alert Behavior During Therapy: Anxious;WFL for tasks assessed/performed Overall Cognitive Status: Within Functional Limits for tasks assessed                                     General Comments  Briefly on RA to pivot to chair with O2 dropping to 89. Reapplied [place] and left on supplemental O2.     Exercises     Shoulder Instructions      Home Living Family/patient expects to be discharged to:: Private residence Living Arrangements: Other relatives Available Help at Discharge: Family;Available PRN/intermittently Type of Home: House Home Access: Level entry     Home Layout: One level(except 1 step into some areas)               Home Equipment: None          Prior Functioning/Environment Level of Independence: Independent        Comments: works        OT Problem List: Decreased activity tolerance;Impaired balance (sitting and/or standing);Decreased knowledge of use of DME or AE;Decreased knowledge of precautions;Pain      OT Treatment/Interventions: Self-care/ADL training;DME and/or AE instruction;Therapeutic activities;Patient/family education;Balance training    OT Goals(Current goals can be found in the care plan section) Acute Rehab OT Goals Patient Stated Goal: return to independence OT Goal Formulation: With patient Time For Goal Achievement: 05/01/17 Potential to Achieve Goals: Good ADL Goals Pt Will Perform Grooming: with min assist;standing Pt Will Perform Upper Body Bathing: with set-up;with supervision;sitting Pt Will Perform Lower Body Bathing: with min guard assist;sit to/from stand Pt Will Transfer to Toilet: with min assist;ambulating Pt Will Perform Toileting - Clothing Manipulation and hygiene: with min guard assist;sit to/from stand Additional ADL Goal #1: Pt will complete bed mobility at min guard level to  prepare for OOB ADLs.  OT Frequency: Min 3X/week   Barriers to D/C:            Co-evaluation              AM-PAC PT "6 Clicks" Daily Activity     Outcome Measure Help from another person eating meals?: None Help from another person taking care of personal grooming?: A Lot Help from another person toileting, which includes using toliet, bedpan, or urinal?: A Lot Help from another person bathing (including washing, rinsing, drying)?: A Lot Help from another person to put on and taking off regular upper body clothing?: A Lot Help from another person to put on and taking off regular lower body clothing?: A Lot 6 Click Score: 14   End of Session Equipment Utilized During Treatment: Gait belt;Rolling walker;Oxygen  Activity Tolerance: Patient limited by fatigue;Patient limited by pain Patient left: in chair;with call bell/phone within reach;with nursing/sitter in room  OT Visit Diagnosis: Unsteadiness on feet (R26.81);Pain                Time: 1059-1120 OT Time Calculation (min): 21 min Charges:  OT General Charges $OT Visit: 1 Visit OT Evaluation $OT Eval Low Complexity: 1 Low G-Codes:       Kayla Wyatt 04/17/2017, 12:59 PM

## 2017-04-17 NOTE — Progress Notes (Signed)
Subjective: No reports of bleeding.  Feeling well.  Objective: Vital signs in last 24 hours: Temp:  [97.3 F (36.3 C)-99.1 F (37.3 C)] 97.3 F (36.3 C) (02/08 0603) Pulse Rate:  [50-101] 62 (02/08 0603) Resp:  [15-24] 18 (02/08 0603) BP: (106-150)/(38-85) 135/66 (02/08 0603) SpO2:  [93 %-100 %] 98 % (02/08 0603) Weight:  [81.7 kg (180 lb 1.9 oz)] 81.7 kg (180 lb 1.9 oz) (02/08 0445) Last BM Date: 04/16/17  Intake/Output from previous day: 02/07 0701 - 02/08 0700 In: 740 [P.O.:720; I.V.:20] Out: 3925 [Urine:3925] Intake/Output this shift: No intake/output data recorded.  General appearance: alert and no distress GI: soft, non-tender; bowel sounds normal; no masses,  no organomegaly  Lab Results: Recent Labs    04/15/17 0755  04/16/17 0400  04/16/17 1545 04/16/17 2024 04/17/17 0429  WBC 12.4*  --  6.9  --   --   --  5.4  HGB 8.8*   < > 7.9*   < > 8.1* 8.5* 8.5*  HCT 27.5*   < > 24.7*   < > 25.8* 26.6* 26.9*  PLT 57*  --  52*  --   --   --  64*   < > = values in this interval not displayed.   BMET Recent Labs    04/15/17 0507 04/16/17 0400 04/17/17 0429  NA 146* 147* 146*  K 3.6 2.8* 2.8*  CL 105 105 103  CO2 29 29 31   GLUCOSE 92 86 91  BUN 18 18 15   CREATININE 1.60* 1.54* 1.38*  CALCIUM 7.5* 7.5* 7.8*   LFT Recent Labs    04/17/17 0429  PROT 3.8*  ALBUMIN 1.7*  AST 66*  ALT 125*  ALKPHOS 43  BILITOT 0.6   PT/INR Recent Labs    04/14/17 0920 04/15/17 0755  LABPROT 15.8* 15.0  INR 1.27 1.19   Hepatitis Panel No results for input(s): HEPBSAG, HCVAB, HEPAIGM, HEPBIGM in the last 72 hours. C-Diff No results for input(s): CDIFFTOX in the last 72 hours. Fecal Lactopherrin No results for input(s): FECLLACTOFRN in the last 72 hours.  Studies/Results: No results found.  Medications:  Scheduled: . arformoterol  15 mcg Nebulization BID  . budesonide (PULMICORT) nebulizer solution  0.5 mg Nebulization BID  . Chlorhexidine Gluconate Cloth  6  each Topical Daily  . feeding supplement  1 Container Oral TID BM  . furosemide  60 mg Intravenous BID  . Gerhardt's butt cream   Topical TID  . insulin aspart  2-6 Units Subcutaneous Q4H  . pantoprazole  40 mg Oral BID  . potassium chloride  40 mEq Oral BID  . sodium chloride flush  3 mL Intravenous Q12H   Continuous: . sodium chloride      Assessment/Plan: 1) S/p upper GI bleed. 2) Anemia.   No overt evidence of bleeding.  She was given one unit of PRBC on 04/15/2017.  Plan: 1) Follow HGB. 2) Transfuse as necessary.  LOS: 15 days   Kayla Wyatt 04/17/2017, 7:27 AM

## 2017-04-17 NOTE — Progress Notes (Signed)
Pt had one episode of 188mL dark  bloody stool. Notified MD. Will continue to monitor pt.

## 2017-04-17 NOTE — Progress Notes (Signed)
PROGRESS NOTE  Kayla Wyatt VCB:[id number] DOB: April 18, 1955 DOA: 04/02/2017 PCP: [name], MD  HPI/Recap of past 83 hours: 62 year old female with history of hypertension, hyperlipidemia presented on 04/02/17 with reported two days of ABD pain and bloody diarrhea. States she has had 15+ bowel movements in the morning of arrival. GI consulted. EGD 1/25 with single visible vessel in the setting of a mild ulceration was found in the stomach in which was clipped. Patient continued to have anemia, hypotension, and melena. Was taken for colonoscopy on 1/31 in which revealed diverticulosis. CTA to check for PE showed a 4.1 cm mass in the mediastinum with scattered lymphadenopathy and a right pleural effusion  During stay patient was found to have right LE DVT and started on Xarelto on 2/1. 2/2 patient had hemoptysis and Xarelto was stopped and given 4 units RBCs & 2 FFP. 2/3 in the early morning patient was found minimally responsive and hypotensive, requiring intubation and vent support. Transferred to ICU and GI called.  Had IR embolization. She was subsequently given Kcentra, 1 pack of platelets and 2 additional PRBC. Pt was started on pressor support, subsequently weaned off and extubated.  Patient transferred back to [facility] hospitalist services.  Today, patient reported feeling better overall.  Has had 2 episodes of bloody bowel movements, but still remains stable.  Denied any rectal pain, chest pain, abdominal pain, shortness of breath, dizziness.   Assessment/Plan: Principal Problem:   Gastrointestinal hemorrhage with melena Active Problems:   Acute blood loss anemia   Lower abdominal pain of unknown etiology   Abnormal computed tomography angiography (CTA) of abdomen and pelvis   Persistent atrial fibrillation with rapid ventricular response ([place])   Encounter for intubation   Respiratory failure (HCC)  Upper GI bleed Improving, still having some bloody stools, melena/hemoptysis  resolved Likely due to gastric ulcer, s/p clipping 1/25 and coil embolization by IR on 2/3 and 2/ CT abd neg for retroperitoneal bleed Multiple blood transfusion, continue p.o. Lasix GI on board, if Hgb drops will repeat EGD Clear liquid diet, advance as tolerated PPI BID Frequent H&H, monitor closely Telemetry  Hemorrhagic shock Resolved Likely due to above  Acute blood loss anemia secondary to upper GI bleed Hold all anticoagulation Frequent H&H  AKI with electrolyte disturbance Resolving Multifactorial including contrast induced Daily BMET, replace as needed, avoid nephrotoxic's  Acute hypoxic respiratory failure Resolved In the setting of severe GI bleed/shock CT chest with 4.1 cm mass in the aortopulmonary window, workup once stable  Paroxysmal A. fib with RVR Currently rate controlled No anticoagulation due to massive GI bleed  Right lower extremity DVT Currently unable to anticoagulate due to massive GI bleed Status post IVC filter placement  E. coli bacteremia Afebrile, no leukocytosis Blood culture x2 no growth till date Completed course of antibiotics    Code Status: Full  Family Communication: None at bedside  Disposition Plan: CIR   Consultants:  PCCM  GI  IR  Cardiology  Procedures:  Endoscopy, colonoscopy  IR embolectomy  IVC filter placement  Intubation  Antimicrobials:  Status post IV ceftriaxone  DVT prophylaxis: SCDs   Objective: Vitals:   04/17/17 0445 04/17/17 0603 04/17/17 0935 04/17/17 1258  BP:  135/66  (!) 140/47  Pulse:  62 85 (!) 57  Resp:  18 20   Temp:  (!) 97.3 F (36.3 C)    TempSrc:  Oral    SpO2:  98% 97%   Weight: 81.7 kg (180 lb 1.9 oz)  Height:        Intake/Output Summary (Last 24 hours) at 04/17/2017 1651 Last data filed at 04/17/2017 1600 Gross per 24 hour  Intake 280 ml  Output 2700 ml  Net -2420 ml   Filed Weights   04/15/17 0500 04/16/17 0500 04/17/17 0445  Weight: 88.6 kg (195  lb 5.2 oz) 85.4 kg (188 lb 4.4 oz) 81.7 kg (180 lb 1.9 oz)    Exam:   General: Alert, awake, NAD  Cardiovascular: S1, S2 present, no added heart sounds  Respiratory: Chest clear bilaterally  Abdomen: Soft, nontender, nondistended, bowel sounds present  Musculoskeletal: No pedal edema bilaterally  Skin: Normal  Psychiatry: Normal mood   Data Reviewed: CBC: Recent Labs  Lab 04/11/17 0328  04/12/17 0412  04/14/17 0338  04/15/17 0507 04/15/17 0755  04/16/17 0400 04/16/17 0746 04/16/17 1545 04/16/17 2024 04/17/17 0429 04/17/17 0830  WBC 8.8  --  35.2*   < > 22.6*  --  12.3* 12.4*  --  6.9  --   --   --  5.4  --   NEUTROABS 6.5  --  30.9*  --   --   --   --   --   --   --   --   --   --   --   --   HGB 7.8*   < > 9.6*   < > 8.9*   < > 8.5* 8.8*   < > 7.9* 8.0* 8.1* 8.5* 8.5* 8.8*  HCT 24.0*   < > 28.3*   < > 26.0*   < > 26.3* 27.5*   < > 24.7* 24.9* 25.8* 26.6* 26.9* 27.8*  MCV 98.0  --  88.2   < > 91.5  --  93.3 94.2  --  95.4  --   --   --  97.1  --   PLT 176  --  72*   < > 72*  --  54* 57*  --  52*  --   --   --  64*  --    < > = values in this interval not displayed.   Basic Metabolic Panel: Recent Labs  Lab 04/12/17 0342  04/13/17 0346 04/14/17 0044 04/15/17 0507 04/16/17 0400 04/17/17 0429  NA 149*   < > 145 146* 146* 147* 146*  K 4.0   < > 3.9 3.8 3.6 2.8* 2.8*  CL 116*   < > 105 103 105 105 103  CO2 20*   < > 28 31 29 29 31   GLUCOSE 232*   < > 223* 131* 92 86 91  BUN 12   < > 12 16 18 18 15   CREATININE 1.16*   < > 1.46* 1.59* 1.60* 1.54* 1.38*  CALCIUM 6.3*   < > 6.4* 7.2* 7.5* 7.5* 7.8*  MG 1.5*  --  2.0 2.0  --   --   --   PHOS 7.3*  --  5.0* 5.6* 4.9*  --   --    < > = values in this interval not displayed.   GFR: Estimated Creatinine Clearance: 41.7 mL/min (A) (by C-G formula based on SCr of 1.38 mg/dL (H)). Liver Function Tests: Recent Labs  Lab 04/15/17 0507 04/16/17 0400 04/17/17 0429  AST  --  93* 66*  ALT  --  152* 125*  ALKPHOS   --  40 43  BILITOT  --  0.8 0.6  PROT  --  3.5* 3.8*  ALBUMIN 1.5* 1.5* 1.7*  No results for input(s): LIPASE, AMYLASE in the last 168 hours. No results for input(s): AMMONIA in the last 168 hours. Coagulation Profile: Recent Labs  Lab 04/13/17 0926 04/14/17 0920 04/15/17 0755  INR 1.97 1.27 1.19   Cardiac Enzymes: No results for input(s): CKTOTAL, CKMB, CKMBINDEX, TROPONINI in the last 168 hours. BNP (last 3 results) No results for input(s): PROBNP in the last 8760 hours. HbA1C: No results for input(s): HGBA1C in the last 72 hours. CBG: Recent Labs  Lab 04/16/17 1950 04/17/17 0043 04/17/17 0417 04/17/17 0742 04/17/17 1151  GLUCAP 115* 101* 89 90 144*   Lipid Profile: No results for input(s): CHOL, HDL, LDLCALC, TRIG, CHOLHDL, LDLDIRECT in the last 72 hours. Thyroid Function Tests: No results for input(s): TSH, T4TOTAL, FREET4, T3FREE, THYROIDAB in the last 72 hours. Anemia Panel: No results for input(s): VITAMINB12, FOLATE, FERRITIN, TIBC, IRON, RETICCTPCT in the last 72 hours. Urine analysis:    Component Value Date/Time   COLORURINE YELLOW 04/02/2017 1705   APPEARANCEUR CLOUDY (A) 04/02/2017 1705   LABSPEC 1.008 04/02/2017 1705   PHURINE 5.0 04/02/2017 1705   GLUCOSEU NEGATIVE 04/02/2017 1705   HGBUR LARGE (A) 04/02/2017 1705   BILIRUBINUR NEGATIVE 04/02/2017 1705   KETONESUR NEGATIVE 04/02/2017 1705   PROTEINUR NEGATIVE 04/02/2017 1705   NITRITE NEGATIVE 04/02/2017 1705   LEUKOCYTESUR LARGE (A) 04/02/2017 1705   Sepsis Labs: @LABRCNTIP (procalcitonin:4,lacticidven:4)  )No results found for this or any previous visit (from the past 240 hour(s)).    Studies: No results found.  Scheduled Meds: . arformoterol  15 mcg Nebulization BID  . budesonide (PULMICORT) nebulizer solution  0.5 mg Nebulization BID  . Chlorhexidine Gluconate Cloth  6 each Topical Daily  . feeding supplement  1 Container Oral TID BM  . furosemide  60 mg Intravenous BID  .  Gerhardt's butt cream   Topical TID  . insulin aspart  2-6 Units Subcutaneous Q4H  . pantoprazole  40 mg Oral BID  . potassium chloride  40 mEq Oral BID  . sodium chloride flush  3 mL Intravenous Q12H    Continuous Infusions: . sodium chloride       LOS: 15 days     Alma Friendly, MD [facility]   If 7PM-7AM, please contact night-coverage [email or web address] Password [facility] 04/17/2017, 4:51 PM

## 2017-04-17 NOTE — Progress Notes (Signed)
Pt had a bloody stool per rectum. Notified Horris Latino, MD and made aware. Awaiting orders. Will continue to monitor pt.

## 2017-04-18 ENCOUNTER — Inpatient Hospital Stay (HOSPITAL_COMMUNITY): Payer: BLUE CROSS/BLUE SHIELD

## 2017-04-18 LAB — CBC
HCT: 31.2 % — ABNORMAL LOW (ref 36.0–46.0)
Hemoglobin: 9.8 g/dL — ABNORMAL LOW (ref 12.0–15.0)
MCH: 30.4 pg (ref 26.0–34.0)
MCHC: 31.4 g/dL (ref 30.0–36.0)
MCV: 96.9 fL (ref 78.0–100.0)
Platelets: 81 10*3/uL — ABNORMAL LOW (ref 150–400)
RBC: 3.22 MIL/uL — ABNORMAL LOW (ref 3.87–5.11)
RDW: 19.2 % — ABNORMAL HIGH (ref 11.5–15.5)
WBC: 6.5 10*3/uL (ref 4.0–10.5)

## 2017-04-18 LAB — GLUCOSE, CAPILLARY
Glucose-Capillary: 101 mg/dL — ABNORMAL HIGH (ref 65–99)
Glucose-Capillary: 91 mg/dL (ref 65–99)
Glucose-Capillary: 89 mg/dL (ref 65–99)
Glucose-Capillary: 93 mg/dL (ref 65–99)
Glucose-Capillary: 108 mg/dL — ABNORMAL HIGH (ref 65–99)

## 2017-04-18 LAB — COMPREHENSIVE METABOLIC PANEL
ALT: 101 U/L — ABNORMAL HIGH (ref 14–54)
AST: 50 U/L — ABNORMAL HIGH (ref 15–41)
Albumin: 1.9 g/dL — ABNORMAL LOW (ref 3.5–5.0)
Alkaline Phosphatase: 49 U/L (ref 38–126)
Anion gap: 13 (ref 5–15)
BUN: 12 mg/dL (ref 6–20)
CO2: 30 mmol/L (ref 22–32)
Calcium: 7.8 mg/dL — ABNORMAL LOW (ref 8.9–10.3)
Chloride: 101 mmol/L (ref 101–111)
Creatinine, Ser: 1.23 mg/dL — ABNORMAL HIGH (ref 0.44–1.00)
GFR calc Af Amer: 53 mL/min — ABNORMAL LOW (ref >60)
GFR calc non Af Amer: 45 mL/min — ABNORMAL LOW (ref >60)
Glucose, Bld: 107 mg/dL — ABNORMAL HIGH (ref 65–99)
Potassium: 2.8 mmol/L — ABNORMAL LOW (ref 3.5–5.1)
Sodium: 144 mmol/L (ref 135–145)
Total Bilirubin: 0.8 mg/dL (ref 0.3–1.2)
Total Protein: 4.4 g/dL — ABNORMAL LOW (ref 6.5–8.1)

## 2017-04-18 MED ORDER — POTASSIUM CHLORIDE 10 MEQ/100ML IV SOLN
10.0000 meq | INTRAVENOUS | Status: AC
  Administered 2017-04-18 (×3): 10 meq via INTRAVENOUS
  Filled 2017-04-18 (×3): qty 100

## 2017-04-18 MED ORDER — GI COCKTAIL ~~LOC~~
30.0000 mL | Freq: Three times a day (TID) | ORAL | Status: DC | PRN
  Administered 2017-04-18: 30 mL via ORAL
  Filled 2017-04-18 (×2): qty 30

## 2017-04-18 MED ORDER — MORPHINE SULFATE (PF) 2 MG/ML IV SOLN
1.0000 mg | Freq: Three times a day (TID) | INTRAVENOUS | Status: DC | PRN
  Administered 2017-04-18: 1 mg via INTRAVENOUS
  Filled 2017-04-18: qty 1

## 2017-04-18 NOTE — Progress Notes (Signed)
Physical Therapy Treatment Patient Details Name: Kayla Wyatt MRN: [id number] DOB: [birth year] Today's Date: 04/18/2017    History of Present Illness Pt is a 62 y/o female admitted on 04/02/17 secondary to abdominal pain and diarrhea. Pt found to have anemia secondary to a GI bleed. Pt's course complicated due to an acute R LE DVT and now s/p IVC filter placement on 2/3. Pt hypotensive and tachycardic requiring intubation from 2/3 to 2/6 with an emergent EGD. PMH including but not limited to HTN.    PT Comments    Pt with abdominal pain this morning that is limiting all activity. She transferred bed to recliner then recliner to [facility] and back to recliner with +2 min A from bed and BSC and +2 mod A from lower recliner. Pt reports numbness in feet as well as pain posterior LE's in standing, encouraged her to continue to be motivated to get up and get pressure on feet and legs stretched out as issues seem to be mostly from being in bed prolonged time. Pt somewhat agreeable but very anxious today and distracted by abdominal pain. PT will continue to follow.    Follow Up Recommendations  CIR;Supervision/Assistance - 24 hour     Equipment Recommendations  None recommended by PT;Other (comment)(TBD)    Recommendations for Other Services Rehab consult     Precautions / Restrictions Precautions Precautions: Fall Restrictions Weight Bearing Restrictions: No    Mobility  Bed Mobility Overal bed mobility: Needs Assistance Bed Mobility: Supine to Sit     Supine to sit: Min assist     General bed mobility comments: pt able to get to EOB with increased time and use of rail. Needed min A to scoot hips to EOB  Transfers Overall transfer level: Needs assistance   Transfers: Sit to/from [facility] Sit to Stand: Min assist;+2 physical assistance;Mod assist Stand pivot transfers: Min assist;+2 physical assistance       General transfer comment: pt performed sit to stand  several times, min A +2 from bed and BSC but needed mod A +2 from lower recliner. Pt able to step feet surface to surface but c/o numbness in B feet as well as posterior LE pain. Pt given encouragement that this would improve as she is able to get OOB and on her feet more.   Ambulation/Gait             General Gait Details: pt declined due to abdominal pain. Had her stand within RW and march in place to increase time in [place].    Stairs            Wheelchair Mobility    Modified Rankin (Stroke Patients Only)       Balance Overall balance assessment: Needs assistance Sitting-balance support: Feet supported Sitting balance-Leahy Scale: Good Sitting balance - Comments: sat EOB to eat her breakfast x5 mins   Standing balance support: No upper extremity supported Standing balance-Leahy Scale: Fair Standing balance comment: was able to reach down to pull up mesh briefs without UE support but min-guard A                            Cognition Arousal/Alertness: Awake/alert Behavior During Therapy: Anxious Overall Cognitive Status: Within Functional Limits for tasks assessed                                 General  Comments: pt very shaky and anxious throughout session, bothered by tele box, lines, etc. Her brother reports the anxiety has only been an issue in the hospital      Exercises General Exercises - Lower Extremity Ankle Circles/Pumps: AROM;Both;10 reps;Seated    General Comments General comments (skin integrity, edema, etc.): Pt on RA for mobility, O2 sats 89-90%. Returned to 2L O2 after session      Pertinent Vitals/Pain Pain Assessment: 0-10 Pain Score: 6  Pain Location: abdomen Pain Descriptors / Indicators: Cramping Pain Intervention(s): Limited activity within patient's tolerance;Monitored during session    Home Living                      Prior Function            PT Goals (current goals can now be found in the  care plan section) Acute Rehab PT Goals Patient Stated Goal: return to independence PT Goal Formulation: With patient Time For Goal Achievement: 04/30/17 Potential to Achieve Goals: Good Progress towards PT goals: Progressing toward goals    Frequency    Min 3X/week      PT Plan Current plan remains appropriate    Co-evaluation              AM-PAC PT "6 Clicks" Daily Activity  Outcome Measure  Difficulty turning over in bed (including adjusting bedclothes, sheets and blankets)?: A Little Difficulty moving from lying on back to sitting on the side of the bed? : A Little Difficulty sitting down on and standing up from a chair with arms (e.g., wheelchair, bedside commode, etc,.)?: Unable Help needed moving to and from a bed to chair (including a wheelchair)?: A Lot Help needed walking in hospital room?: A Lot Help needed climbing 3-5 steps with a railing? : Total 6 Click Score: 12    End of Session Equipment Utilized During Treatment: Oxygen Activity Tolerance: Patient limited by fatigue;Patient limited by pain Patient left: in chair;with chair alarm set;with call bell/phone within reach;with nursing/sitter in room Nurse Communication: Mobility status PT Visit Diagnosis: Other abnormalities of gait and mobility (R26.89);Pain Pain - part of body: (abdomen)     Time: 9179-1505 PT Time Calculation (min) (ACUTE ONLY): 47 min  Charges:  $Gait Training: 8-22 mins $Therapeutic Activity: 23-37 mins                    G Codes:       [place] 04/18/2017, 10:11 AM

## 2017-04-18 NOTE — Progress Notes (Signed)
PROGRESS NOTE  Kayla Wyatt AOZ:[id number] DOB: December 30, 1955 DOA: 04/02/2017 PCP: [name], MD  HPI/Recap of past 20 hours: 62 year old female with history of hypertension, hyperlipidemia presented on 04/02/17 with reported two days of ABD pain and bloody diarrhea. States she has had 15+ bowel movements in the morning of arrival. GI consulted. EGD 1/25 with single visible vessel in the setting of a mild ulceration was found in the stomach in which was clipped. Patient continued to have anemia, hypotension, and melena. Was taken for colonoscopy on 1/31 in which revealed diverticulosis. CTA to check for PE showed a 4.1 cm mass in the mediastinum with scattered lymphadenopathy and a right pleural effusion  During stay patient was found to have right LE DVT and started on Xarelto on 2/1. 2/2 patient had hemoptysis and Xarelto was stopped and given 4 units RBCs & 2 FFP. 2/3 in the early morning patient was found minimally responsive and hypotensive, requiring intubation and vent support. Transferred to ICU and GI called.  Had IR embolization. She was subsequently given Kcentra, 1 pack of platelets and 2 additional PRBC. Pt was started on pressor support, subsequently weaned off and extubated.  Patient transferred back to [facility] hospitalist services.  Today, patient reported having upper abd/epigastric pain. Denies any N/V/D, no further episodes of bloody bowel movements, chest pain, fever/chills   Assessment/Plan: Principal Problem:   Gastrointestinal hemorrhage with melena Active Problems:   Acute blood loss anemia   Lower abdominal pain of unknown etiology   Abnormal computed tomography angiography (CTA) of abdomen and pelvis   Persistent atrial fibrillation with rapid ventricular response ([place])   Encounter for intubation   Respiratory failure (HCC)  Upper GI bleed Improving, still having some bloody stools, melena/hemoptysis resolved Likely due to gastric ulcer, s/p clipping 1/25 and coil  embolization by IR on 2/3 and 2/ CT abd neg for retroperitoneal bleed Multiple blood transfusion, continue p.o. Lasix GI on board, if Hgb drops will repeat EGD Clear liquid diet, advance as tolerated PPI BID Frequent H&H, monitor closely Telemetry  Abdominal/epigastric pain Afebrile, no leukocytosis Passing gas, Abd xray cannot exclude early small bowel obstruction Will repeat in am if pain continues to persist and consider Gen surg consult On PPI, GI cocktail prn   Hemorrhagic shock Resolved Likely due to above  Acute blood loss anemia secondary to upper GI bleed Hold all anticoagulation Frequent H&H  AKI with electrolyte disturbance Resolving Multifactorial including contrast induced Daily BMET, replace as needed, avoid nephrotoxic's  Acute hypoxic respiratory failure Resolved In the setting of severe GI bleed/shock CT chest with 4.1 cm mass in the aortopulmonary window, workup once stable  Paroxysmal A. fib with RVR Currently rate controlled No anticoagulation due to massive GI bleed  Right lower extremity DVT Currently unable to anticoagulate due to massive GI bleed Status post IVC filter placement  E. coli bacteremia Afebrile, no leukocytosis Blood culture x2 no growth till date Completed course of antibiotics    Code Status: Full  Family Communication: None at bedside  Disposition Plan: CIR   Consultants:  PCCM  GI  IR  Cardiology  Procedures:  Endoscopy, colonoscopy  IR embolectomy  IVC filter placement  Intubation  Antimicrobials:  Status post IV ceftriaxone  DVT prophylaxis: SCDs   Objective: Vitals:   04/18/17 0423 04/18/17 0504 04/18/17 1319 04/18/17 1557  BP:  (!) 139/53 (!) 168/53 (!) 138/58  Pulse:  65 72 93  Resp:  (!) 22 16 18   Temp:  98 F (36.7  C) 97.8 F (36.6 C) 98.2 F (36.8 C)  TempSrc:  Oral Oral Oral  SpO2:  96% 99% 94%  Weight: 76.9 kg (169 lb 8.5 oz)     Height:        Intake/Output Summary  (Last 24 hours) at 04/18/2017 1825 Last data filed at 04/18/2017 1819 Gross per 24 hour  Intake 560 ml  Output 6795 ml  Net -6235 ml   Filed Weights   04/16/17 0500 04/17/17 0445 04/18/17 0423  Weight: 85.4 kg (188 lb 4.4 oz) 81.7 kg (180 lb 1.9 oz) 76.9 kg (169 lb 8.5 oz)    Exam:   General: Alert, awake, NAD  Cardiovascular: S1, S2 present, no added heart sounds  Respiratory: Chest clear bilaterally  Abdomen: Soft, nontender, nondistended, bowel sounds present  Musculoskeletal: No pedal edema bilaterally  Skin: Normal  Psychiatry: Normal mood   Data Reviewed: CBC: Recent Labs  Lab 04/12/17 0412  04/15/17 0507 04/15/17 0755  04/16/17 0400  04/16/17 2024 04/17/17 0429 04/17/17 0830 04/17/17 1719 04/18/17 0452  WBC 35.2*   < > 12.3* 12.4*  --  6.9  --   --  5.4  --   --  6.5  NEUTROABS 30.9*  --   --   --   --   --   --   --   --   --   --   --   HGB 9.6*   < > 8.5* 8.8*   < > 7.9*   < > 8.5* 8.5* 8.8* 9.9* 9.8*  HCT 28.3*   < > 26.3* 27.5*   < > 24.7*   < > 26.6* 26.9* 27.8* 31.7* 31.2*  MCV 88.2   < > 93.3 94.2  --  95.4  --   --  97.1  --   --  96.9  PLT 72*   < > 54* 57*  --  52*  --   --  64*  --   --  81*   < > = values in this interval not displayed.   Basic Metabolic Panel: Recent Labs  Lab 04/12/17 0342  04/13/17 0346 04/14/17 0044 04/15/17 0507 04/16/17 0400 04/17/17 0429 04/18/17 0452  NA 149*   < > 145 146* 146* 147* 146* 144  K 4.0   < > 3.9 3.8 3.6 2.8* 2.8* 2.8*  CL 116*   < > 105 103 105 105 103 101  CO2 20*   < > 28 31 29 29 31 30   GLUCOSE 232*   < > 223* 131* 92 86 91 107*  BUN 12   < > 12 16 18 18 15 12   CREATININE 1.16*   < > 1.46* 1.59* 1.60* 1.54* 1.38* 1.23*  CALCIUM 6.3*   < > 6.4* 7.2* 7.5* 7.5* 7.8* 7.8*  MG 1.5*  --  2.0 2.0  --   --   --   --   PHOS 7.3*  --  5.0* 5.6* 4.9*  --   --   --    < > = values in this interval not displayed.   GFR: Estimated Creatinine Clearance: 45.4 mL/min (A) (by C-G formula based on SCr of  1.23 mg/dL (H)). Liver Function Tests: Recent Labs  Lab 04/15/17 0507 04/16/17 0400 04/17/17 0429 04/18/17 0452  AST  --  93* 66* 50*  ALT  --  152* 125* 101*  ALKPHOS  --  40 43 49  BILITOT  --  0.8 0.6 0.8  PROT  --  3.5* 3.8* 4.4*  ALBUMIN 1.5* 1.5* 1.7* 1.9*   No results for input(s): LIPASE, AMYLASE in the last 168 hours. No results for input(s): AMMONIA in the last 168 hours. Coagulation Profile: Recent Labs  Lab 04/13/17 0926 04/14/17 0920 04/15/17 0755  INR 1.97 1.27 1.19   Cardiac Enzymes: No results for input(s): CKTOTAL, CKMB, CKMBINDEX, TROPONINI in the last 168 hours. BNP (last 3 results) No results for input(s): PROBNP in the last 8760 hours. HbA1C: No results for input(s): HGBA1C in the last 72 hours. CBG: Recent Labs  Lab 04/17/17 2350 04/18/17 0417 04/18/17 0812 04/18/17 1215 04/18/17 1701  GLUCAP 146* 101* 91 89 93   Lipid Profile: No results for input(s): CHOL, HDL, LDLCALC, TRIG, CHOLHDL, LDLDIRECT in the last 72 hours. Thyroid Function Tests: No results for input(s): TSH, T4TOTAL, FREET4, T3FREE, THYROIDAB in the last 72 hours. Anemia Panel: No results for input(s): VITAMINB12, FOLATE, FERRITIN, TIBC, IRON, RETICCTPCT in the last 72 hours. Urine analysis:    Component Value Date/Time   COLORURINE YELLOW 04/02/2017 1705   APPEARANCEUR CLOUDY (A) 04/02/2017 1705   LABSPEC 1.008 04/02/2017 1705   PHURINE 5.0 04/02/2017 1705   GLUCOSEU NEGATIVE 04/02/2017 1705   HGBUR LARGE (A) 04/02/2017 1705   BILIRUBINUR NEGATIVE 04/02/2017 1705   KETONESUR NEGATIVE 04/02/2017 1705   PROTEINUR NEGATIVE 04/02/2017 1705   NITRITE NEGATIVE 04/02/2017 1705   LEUKOCYTESUR LARGE (A) 04/02/2017 1705   Sepsis Labs: @LABRCNTIP (procalcitonin:4,lacticidven:4)  )No results found for this or any previous visit (from the past 240 hour(s)).    Studies: Dg Abd Portable 2v  Result Date: 04/18/2017 CLINICAL DATA:  Abdominal pain EXAM: PORTABLE ABDOMEN - 2 VIEW  COMPARISON:  04/13/2017 FINDINGS: Mildly prominent mid abdominal small bowel loops are noted. Gas within mildly prominent stomach. Interval removal of NG tube. No organomegaly or suspicious calcification. IVC filter in place. IMPRESSION: Increasing prominence of mid to lower abdominal small bowel loops, cannot exclude early small bowel obstruction. Electronically Signed   By: Rolm Baptise M.D.   On: 04/18/2017 12:37    Scheduled Meds: . arformoterol  15 mcg Nebulization BID  . budesonide (PULMICORT) nebulizer solution  0.5 mg Nebulization BID  . Chlorhexidine Gluconate Cloth  6 each Topical Daily  . feeding supplement  1 Container Oral TID BM  . furosemide  60 mg Intravenous BID  . Gerhardt's butt cream   Topical TID  . insulin aspart  2-6 Units Subcutaneous Q4H  . pantoprazole  40 mg Oral BID  . potassium chloride  40 mEq Oral BID  . sodium chloride flush  3 mL Intravenous Q12H    Continuous Infusions: . sodium chloride       LOS: 16 days     Alma Friendly, MD [facility]   If 7PM-7AM, please contact night-coverage [email or web address] Password [facility] 04/18/2017, 6:25 PM

## 2017-04-19 ENCOUNTER — Inpatient Hospital Stay (HOSPITAL_COMMUNITY): Payer: BLUE CROSS/BLUE SHIELD

## 2017-04-19 LAB — BASIC METABOLIC PANEL
Anion gap: 12 (ref 5–15)
BUN: 8 mg/dL (ref 6–20)
CO2: 28 mmol/L (ref 22–32)
Calcium: 7.5 mg/dL — ABNORMAL LOW (ref 8.9–10.3)
Chloride: 99 mmol/L — ABNORMAL LOW (ref 101–111)
Creatinine, Ser: 1.05 mg/dL — ABNORMAL HIGH (ref 0.44–1.00)
GFR calc Af Amer: >60 mL/min (ref >60)
GFR calc non Af Amer: 55 mL/min — ABNORMAL LOW (ref >60)
Glucose, Bld: 103 mg/dL — ABNORMAL HIGH (ref 65–99)
Potassium: 2.5 mmol/L — CL (ref 3.5–5.1)
Sodium: 139 mmol/L (ref 135–145)

## 2017-04-19 LAB — GLUCOSE, CAPILLARY
Glucose-Capillary: 109 mg/dL — ABNORMAL HIGH (ref 65–99)
Glucose-Capillary: 128 mg/dL — ABNORMAL HIGH (ref 65–99)
Glucose-Capillary: 136 mg/dL — ABNORMAL HIGH (ref 65–99)
Glucose-Capillary: 70 mg/dL (ref 65–99)
Glucose-Capillary: 86 mg/dL (ref 65–99)
Glucose-Capillary: 96 mg/dL (ref 65–99)
Glucose-Capillary: 97 mg/dL (ref 65–99)

## 2017-04-19 LAB — COMPREHENSIVE METABOLIC PANEL
ALT: 85 U/L — ABNORMAL HIGH (ref 14–54)
AST: 46 U/L — ABNORMAL HIGH (ref 15–41)
Albumin: 2.1 g/dL — ABNORMAL LOW (ref 3.5–5.0)
Alkaline Phosphatase: 58 U/L (ref 38–126)
Anion gap: 15 (ref 5–15)
BUN: 10 mg/dL (ref 6–20)
CO2: 29 mmol/L (ref 22–32)
Calcium: 7.9 mg/dL — ABNORMAL LOW (ref 8.9–10.3)
Chloride: 98 mmol/L — ABNORMAL LOW (ref 101–111)
Creatinine, Ser: 1.16 mg/dL — ABNORMAL HIGH (ref 0.44–1.00)
GFR calc Af Amer: 56 mL/min — ABNORMAL LOW (ref >60)
GFR calc non Af Amer: 49 mL/min — ABNORMAL LOW (ref >60)
Glucose, Bld: 80 mg/dL (ref 65–99)
Potassium: 2.4 mmol/L — CL (ref 3.5–5.1)
Sodium: 142 mmol/L (ref 135–145)
Total Bilirubin: 1 mg/dL (ref 0.3–1.2)
Total Protein: 4.8 g/dL — ABNORMAL LOW (ref 6.5–8.1)

## 2017-04-19 LAB — TROPONIN I: Troponin I: <0.03 ng/mL (ref <0.03)

## 2017-04-19 LAB — CBC WITH DIFFERENTIAL/PLATELET
Basophils Absolute: 0 10*3/uL (ref 0.0–0.1)
Basophils Relative: 0 %
Eosinophils Absolute: 0 10*3/uL (ref 0.0–0.7)
Eosinophils Relative: 0 %
HCT: 32.8 % — ABNORMAL LOW (ref 36.0–46.0)
Hemoglobin: 10.5 g/dL — ABNORMAL LOW (ref 12.0–15.0)
Lymphocytes Relative: 12 %
Lymphs Abs: 1.2 10*3/uL (ref 0.7–4.0)
MCH: 30.7 pg (ref 26.0–34.0)
MCHC: 32 g/dL (ref 30.0–36.0)
MCV: 95.9 fL (ref 78.0–100.0)
Monocytes Absolute: 0.8 10*3/uL (ref 0.1–1.0)
Monocytes Relative: 9 %
Neutro Abs: 7.5 10*3/uL (ref 1.7–7.7)
Neutrophils Relative %: 79 %
Platelets: 111 10*3/uL — ABNORMAL LOW (ref 150–400)
RBC: 3.42 MIL/uL — ABNORMAL LOW (ref 3.87–5.11)
RDW: 18.4 % — ABNORMAL HIGH (ref 11.5–15.5)
WBC: 9.6 10*3/uL (ref 4.0–10.5)

## 2017-04-19 LAB — MAGNESIUM
Magnesium: 1.6 mg/dL — ABNORMAL LOW (ref 1.7–2.4)
Magnesium: 2.6 mg/dL — ABNORMAL HIGH (ref 1.7–2.4)

## 2017-04-19 LAB — LIPASE, BLOOD: Lipase: 30 U/L (ref 11–51)

## 2017-04-19 MED ORDER — MAGNESIUM SULFATE 4 GM/100ML IV SOLN
4.0000 g | Freq: Once | INTRAVENOUS | Status: AC
  Administered 2017-04-19: 4 g via INTRAVENOUS
  Filled 2017-04-19: qty 100

## 2017-04-19 MED ORDER — FUROSEMIDE 10 MG/ML IJ SOLN: 40.0000 mg | Freq: Every day | INTRAMUSCULAR | Status: DC

## 2017-04-19 MED ORDER — FUROSEMIDE 10 MG/ML IJ SOLN: 40.0000 mg | Freq: Two times a day (BID) | INTRAMUSCULAR | Status: DC

## 2017-04-19 MED ORDER — POTASSIUM CHLORIDE 10 MEQ/100ML IV SOLN
10.0000 meq | INTRAVENOUS | Status: AC
  Administered 2017-04-19 (×3): 10 meq via INTRAVENOUS
  Filled 2017-04-19 (×3): qty 100

## 2017-04-19 MED ORDER — FUROSEMIDE 10 MG/ML IJ SOLN
40.0000 mg | Freq: Every day | INTRAMUSCULAR | Status: DC
  Filled 2017-04-19: qty 4

## 2017-04-19 MED ORDER — POTASSIUM CHLORIDE 10 MEQ/100ML IV SOLN
10.0000 meq | INTRAVENOUS | Status: AC
  Administered 2017-04-19 – 2017-04-20 (×6): 10 meq via INTRAVENOUS
  Filled 2017-04-19 (×7): qty 100

## 2017-04-19 NOTE — Progress Notes (Signed)
PROGRESS NOTE  Kayla Wyatt OZH:[id number] DOB: 08/15/1955 DOA: 04/02/2017 PCP: [name], MD  HPI/Recap of past 55 hours: 62 year old female with history of hypertension, hyperlipidemia presented on 04/02/17 with reported two days of ABD pain and bloody diarrhea. States she has had 15+ bowel movements in the morning of arrival. GI consulted. EGD 1/25 with single visible vessel in the setting of a mild ulceration was found in the stomach in which was clipped. Patient continued to have anemia, hypotension, and melena. Was taken for colonoscopy on 1/31 in which revealed diverticulosis. CTA to check for PE showed a 4.1 cm mass in the mediastinum with scattered lymphadenopathy and a right pleural effusion  During stay patient was found to have right LE DVT and started on Xarelto on 2/1. 2/2 patient had hemoptysis and Xarelto was stopped and given 4 units RBCs & 2 FFP. 2/3 in the early morning patient was found minimally responsive and hypotensive, requiring intubation and vent support. Transferred to ICU and GI called.  Had IR embolization. She was subsequently given Kcentra, 1 pack of platelets and 2 additional PRBC. Pt was started on pressor support, subsequently weaned off and extubated.  Patient transferred back to [facility].  Overnight, pt was noted to have seizure like episode witnessed by brother who noted pt was having generalized shakes/shivering and at the same time, yelling for help. Lasted for few mins. No incontinence noted/tongue biting. No further episodes. Pt c/o generalized weakness, still with ongoing abdominal discomfort. Pt denies any further episode, denies any headache, chest pain, SOB, fever/chills.   Assessment/Plan: Principal Problem:   Gastrointestinal hemorrhage with melena Active Problems:   Acute blood loss anemia   Lower abdominal pain of unknown etiology   Abnormal computed tomography angiography (CTA) of abdomen and pelvis   Persistent atrial  fibrillation with rapid ventricular response ([place])   Encounter for intubation   Respiratory failure ([place])  ??Seizure No hx of prior seizure Noted to have an episode of generalized shakes/shiver while yelling "help me" at the same time Witnessed by brother in room, lasted for few mins, no tongue biting/incontinence Noted with severe hypokalemia/hypomagnesemia, ??likely cause CT head/EEG pending Replace electrolytes, monitor closely Seizure precautions Neurology in am  Abdominal/epigastric pain Afebrile, no leukocytosis Passing gas, Abd xray cannot exclude early SBO, repeat pending Consider Gen surg consult On PPI, GI cocktail prn   Upper GI bleed Improving, stable H&H, no further bloody BM noted Likely due to gastric ulcer, s/p clipping 1/25 and coil embolization by IR on 2/3 and 2/ CT abd neg for retroperitoneal bleed Multiple blood transfusion, continue p.o. Lasix GI on board, if Hgb drops will repeat EGD Clear liquid diet, advance as tolerated PPI BID Frequent H&H, monitor closely Telemetry  Hemorrhagic shock Resolved Likely due to above  Acute blood loss anemia secondary to upper GI bleed Hold all anticoagulation Daily H&H  AKI with electrolyte disturbance Resolving Multifactorial including contrast induced Daily BMET, replace as needed, avoid nephrotoxic's  Acute hypoxic respiratory failure Resolved In the setting of severe GI bleed/shock CT chest with 4.1 cm mass in the aortopulmonary window, workup once stable  Paroxysmal A. fib with RVR Currently rate controlled No anticoagulation due to massive GI bleed  Right lower extremity DVT Currently unable to anticoagulate due to massive GI bleed Status post IVC filter placement  E. coli bacteremia Afebrile, no leukocytosis Blood culture x2 no growth till date Completed course of antibiotics    Code Status: Full  Family Communication: None at bedside  Disposition Plan:  CIR   Consultants:  PCCM  GI  IR  Cardiology  Procedures:  Endoscopy, colonoscopy  IR embolectomy  IVC filter placement  Intubation  Antimicrobials:  Status post IV ceftriaxone  DVT prophylaxis: SCDs   Objective: Vitals:   04/19/17 0500 04/19/17 0508 04/19/17 0656 04/19/17 1436  BP:  (!) 128/36 (!) 156/43 (!) 119/36  Pulse:  64 66 (!) 57  Resp:  17 (!) 28 (!) 23  Temp:  98.1 F (36.7 C) 99.2 F (37.3 C) 97.7 F (36.5 C)  TempSrc:  Oral Oral Oral  SpO2:  94% 97% 96%  Weight: 76.9 kg (169 lb 8.5 oz)     Height:        Intake/Output Summary (Last 24 hours) at 04/19/2017 1643 Last data filed at 04/19/2017 1601 Gross per 24 hour  Intake 0 ml  Output 4350 ml  Net -4350 ml   Filed Weights   04/17/17 0445 04/18/17 0423 04/19/17 0500  Weight: 81.7 kg (180 lb 1.9 oz) 76.9 kg (169 lb 8.5 oz) 76.9 kg (169 lb 8.5 oz)    Exam:   General: Alert, awake, NAD  Cardiovascular: S1, S2 present, no added heart sounds  Respiratory: Chest clear bilaterally  Abdomen: Soft, +tender, nondistended, bowel sounds present  Musculoskeletal: No pedal edema bilaterally  Skin: Normal  Psychiatry: Normal mood   Data Reviewed: CBC: Recent Labs  Lab 04/15/17 0755  04/16/17 0400  04/17/17 0429 04/17/17 0830 04/17/17 1719 04/18/17 0452 04/19/17 0430  WBC 12.4*  --  6.9  --  5.4  --   --  6.5 9.6  NEUTROABS  --   --   --   --   --   --   --   --  7.5  HGB 8.8*   < > 7.9*   < > 8.5* 8.8* 9.9* 9.8* 10.5*  HCT 27.5*   < > 24.7*   < > 26.9* 27.8* 31.7* 31.2* 32.8*  MCV 94.2  --  95.4  --  97.1  --   --  96.9 95.9  PLT 57*  --  52*  --  64*  --   --  81* 111*   < > = values in this interval not displayed.   Basic Metabolic Panel: Recent Labs  Lab 04/13/17 0346 04/14/17 0044 04/15/17 0507 04/16/17 0400 04/17/17 0429 04/18/17 0452 04/19/17 0430 04/19/17 0730  NA 145 146* 146* 147* 146* 144 142  --   K 3.9 3.8 3.6 2.8* 2.8* 2.8* 2.4*  --   CL 105 103 105 105  103 101 98*  --   CO2 28 31 29 29 31 30 29   --   GLUCOSE 223* 131* 92 86 91 107* 80  --   BUN 12 16 18 18 15 12 10   --   CREATININE 1.46* 1.59* 1.60* 1.54* 1.38* 1.23* 1.16*  --   CALCIUM 6.4* 7.2* 7.5* 7.5* 7.8* 7.8* 7.9*  --   MG 2.0 2.0  --   --   --   --   --  1.6*  PHOS 5.0* 5.6* 4.9*  --   --   --   --   --    GFR: Estimated Creatinine Clearance: 48.1 mL/min (A) (by C-G formula based on SCr of 1.16 mg/dL (H)). Liver Function Tests: Recent Labs  Lab 04/15/17 0507 04/16/17 0400 04/17/17 0429 04/18/17 0452 04/19/17 0430  AST  --  93* 66* 50* 46*  ALT  --  152* 125*  101* 85*  ALKPHOS  --  40 43 49 58  BILITOT  --  0.8 0.6 0.8 1.0  PROT  --  3.5* 3.8* 4.4* 4.8*  ALBUMIN 1.5* 1.5* 1.7* 1.9* 2.1*   Recent Labs  Lab 04/19/17 0430  LIPASE 30   No results for input(s): AMMONIA in the last 168 hours. Coagulation Profile: Recent Labs  Lab 04/13/17 0926 04/14/17 0920 04/15/17 0755  INR 1.97 1.27 1.19   Cardiac Enzymes: Recent Labs  Lab 04/19/17 0430  TROPONINI <0.03   BNP (last 3 results) No results for input(s): PROBNP in the last 8760 hours. HbA1C: No results for input(s): HGBA1C in the last 72 hours. CBG: Recent Labs  Lab 04/18/17 2005 04/19/17 0010 04/19/17 0505 04/19/17 0758 04/19/17 1208  GLUCAP 108* 128* 70 86 109*   Lipid Profile: No results for input(s): CHOL, HDL, LDLCALC, TRIG, CHOLHDL, LDLDIRECT in the last 72 hours. Thyroid Function Tests: No results for input(s): TSH, T4TOTAL, FREET4, T3FREE, THYROIDAB in the last 72 hours. Anemia Panel: No results for input(s): VITAMINB12, FOLATE, FERRITIN, TIBC, IRON, RETICCTPCT in the last 72 hours. Urine analysis:    Component Value Date/Time   COLORURINE YELLOW 04/02/2017 1705   APPEARANCEUR CLOUDY (A) 04/02/2017 1705   LABSPEC 1.008 04/02/2017 1705   PHURINE 5.0 04/02/2017 1705   GLUCOSEU NEGATIVE 04/02/2017 1705   HGBUR LARGE (A) 04/02/2017 1705   BILIRUBINUR NEGATIVE 04/02/2017 1705    KETONESUR NEGATIVE 04/02/2017 1705   PROTEINUR NEGATIVE 04/02/2017 1705   NITRITE NEGATIVE 04/02/2017 1705   LEUKOCYTESUR LARGE (A) 04/02/2017 1705   Sepsis Labs: @LABRCNTIP (procalcitonin:4,lacticidven:4)  )No results found for this or any previous visit (from the past 240 hour(s)).    Studies: No results found.  Scheduled Meds: . arformoterol  15 mcg Nebulization BID  . budesonide (PULMICORT) nebulizer solution  0.5 mg Nebulization BID  . Chlorhexidine Gluconate Cloth  6 each Topical Daily  . feeding supplement  1 Container Oral TID BM  . furosemide  60 mg Intravenous BID  . Gerhardt's butt cream   Topical TID  . insulin aspart  2-6 Units Subcutaneous Q4H  . pantoprazole  40 mg Oral BID  . potassium chloride  40 mEq Oral BID  . sodium chloride flush  3 mL Intravenous Q12H    Continuous Infusions: . sodium chloride       LOS: 17 days     Alma Friendly, MD [facility]   If 7PM-7AM, please contact night-coverage [email or web address] Password [facility] 04/19/2017, 4:43 PM

## 2017-04-19 NOTE — Progress Notes (Signed)
Notified night shift NP of critical Potassium 2.4.  While awaiting orders brother came to nurse station stating that Pt was having "seizure".  Per brother, Pt started shaking and yelling for help. Shaking lasted approx 2-3 min.  Per nurse tech, Pt unresponsive when nurse tech entered room.  Pt was awake, alert and oriented when RN arrived a few minutes later.  Night shift NP notified of possible seizure.  Orders given for IV potassium and to notify Day MD of Pt current status.  Dr. Horris Latino paged with update.

## 2017-04-19 NOTE — Progress Notes (Signed)
Attempt to give Brovana and Pulmicort nebs.  Pt immediately started to gag, which stopped when neb stopped.  Same experience yesterday AM, but pt able to tolerate with mask.  Given w/ mask this AM, unable to tolerate.

## 2017-04-19 NOTE — Progress Notes (Signed)
Pt does not want breathing treatments and says that she does not take treatments at home.

## 2017-04-20 ENCOUNTER — Inpatient Hospital Stay (HOSPITAL_COMMUNITY): Payer: BLUE CROSS/BLUE SHIELD

## 2017-04-20 DIAGNOSIS — R569 Unspecified convulsions: Secondary | ICD-10-CM

## 2017-04-20 LAB — CBC WITH DIFFERENTIAL/PLATELET
Basophils Absolute: 0 10*3/uL (ref 0.0–0.1)
Basophils Relative: 0 %
Eosinophils Absolute: 0.2 10*3/uL (ref 0.0–0.7)
Eosinophils Relative: 3 %
HCT: 27 % — ABNORMAL LOW (ref 36.0–46.0)
Hemoglobin: 8.5 g/dL — ABNORMAL LOW (ref 12.0–15.0)
Lymphocytes Relative: 24 %
Lymphs Abs: 1.4 10*3/uL (ref 0.7–4.0)
MCH: 30.6 pg (ref 26.0–34.0)
MCHC: 31.5 g/dL (ref 30.0–36.0)
MCV: 97.1 fL (ref 78.0–100.0)
Monocytes Absolute: 0.7 10*3/uL (ref 0.1–1.0)
Monocytes Relative: 11 %
Neutro Abs: 3.8 10*3/uL (ref 1.7–7.7)
Neutrophils Relative %: 62 %
Platelets: 103 10*3/uL — ABNORMAL LOW (ref 150–400)
RBC: 2.78 MIL/uL — ABNORMAL LOW (ref 3.87–5.11)
RDW: 18.2 % — ABNORMAL HIGH (ref 11.5–15.5)
WBC: 6.1 10*3/uL (ref 4.0–10.5)

## 2017-04-20 LAB — BASIC METABOLIC PANEL
Anion gap: 10 (ref 5–15)
BUN: 9 mg/dL (ref 6–20)
CO2: 26 mmol/L (ref 22–32)
Calcium: 7.4 mg/dL — ABNORMAL LOW (ref 8.9–10.3)
Chloride: 102 mmol/L (ref 101–111)
Creatinine, Ser: 1.07 mg/dL — ABNORMAL HIGH (ref 0.44–1.00)
GFR calc Af Amer: >60 mL/min (ref >60)
GFR calc non Af Amer: 54 mL/min — ABNORMAL LOW (ref >60)
Glucose, Bld: 87 mg/dL (ref 65–99)
Potassium: 3.7 mmol/L (ref 3.5–5.1)
Sodium: 138 mmol/L (ref 135–145)

## 2017-04-20 LAB — GLUCOSE, CAPILLARY
Glucose-Capillary: 95 mg/dL (ref 65–99)
Glucose-Capillary: 84 mg/dL (ref 65–99)
Glucose-Capillary: 81 mg/dL (ref 65–99)
Glucose-Capillary: 71 mg/dL (ref 65–99)
Glucose-Capillary: 103 mg/dL — ABNORMAL HIGH (ref 65–99)

## 2017-04-20 MED ORDER — SODIUM CHLORIDE 0.9 % IV SOLN
500.0000 mg | Freq: Two times a day (BID) | INTRAVENOUS | Status: DC
  Administered 2017-04-20 – 2017-04-22 (×4): 500 mg via INTRAVENOUS
  Filled 2017-04-20 (×4): qty 5

## 2017-04-20 MED ORDER — MENTHOL 3 MG MT LOZG
1.0000 | LOZENGE | OROMUCOSAL | Status: DC | PRN
  Administered 2017-04-20: 3 mg via ORAL
  Filled 2017-04-20: qty 9

## 2017-04-20 MED ORDER — SODIUM CHLORIDE 0.9 % IV BOLUS (SEPSIS)
500.0000 mL | Freq: Once | INTRAVENOUS | Status: AC
  Administered 2017-04-20: 500 mL via INTRAVENOUS

## 2017-04-20 MED ORDER — WHITE PETROLATUM EX OINT
TOPICAL_OINTMENT | CUTANEOUS | Status: DC | PRN
  Administered 2017-04-20: 1 via TOPICAL
  Filled 2017-04-20 (×2): qty 28.35

## 2017-04-20 NOTE — Progress Notes (Signed)
EEG Completed; Results Pending  

## 2017-04-20 NOTE — Progress Notes (Signed)
Notified NP of BP 88/44. Pt denies symptoms. Bed weight this am 66.3 kgs down from 76 kgs on 04/19/17. Orders to give 53ml bolus and re-weigh Pt this am when able to stand.

## 2017-04-20 NOTE — Progress Notes (Signed)
PROGRESS NOTE  Kayla Wyatt WPY:[id number] DOB: 12/16/1955 DOA: 04/02/2017 PCP: [name], MD  HPI/Recap of past 73 hours: 62 year old female with history of hypertension, hyperlipidemia presented on 04/02/17 with reported two days of ABD pain and bloody diarrhea. States she has had 15+ bowel movements in the morning of arrival. GI consulted. EGD 1/25 with single visible vessel in the setting of a mild ulceration was found in the stomach in which was clipped. Patient continued to have anemia, hypotension, and melena. Was taken for colonoscopy on 1/31 in which revealed diverticulosis. CTA to check for PE showed a 4.1 cm mass in the mediastinum with scattered lymphadenopathy and a right pleural effusion  During stay patient was found to have right LE DVT and started on Xarelto on 2/1. 2/2 patient had hemoptysis and Xarelto was stopped and given 4 units RBCs & 2 FFP. 2/3 in the early morning patient was found minimally responsive and hypotensive, requiring intubation and vent support. Transferred to ICU and GI called.  Had IR embolization. She was subsequently given Kcentra, 1 pack of platelets and 2 additional PRBC. Pt was started on pressor support, subsequently weaned off and extubated.  Patient transferred back to [facility].  Today, pt noted to be more alert, denied any seizure like episode, chest pain, SOB, fever/chills, signs of bleeding. Still reports mild generalized abdominal pain.   Assessment/Plan: Principal Problem:   Gastrointestinal hemorrhage with melena Active Problems:   Acute blood loss anemia   Lower abdominal pain of unknown etiology   Abnormal computed tomography angiography (CTA) of abdomen and pelvis   Persistent atrial fibrillation with rapid ventricular response ([place])   Encounter for intubation   Respiratory failure ([place])  ??Seizure No hx of prior seizure Noted to have an episode of generalized shakes/shiver while yelling "help me" at the same  time Witnessed by brother in room, lasted for few mins, no tongue biting/incontinence Noted with severe hypokalemia/hypomagnesemia, ??likely cause CT head neg EEG with focal slowing over the right temporo-occipital region and occasional epileptiform discharges over the right occipital region Replace electrolytes, monitor closely Seizure precautions Neurology consulted  Abdominal/epigastric pain Afebrile, no leukocytosis Passing gas, moving bowels Abd xray cannot exclude early SBO, repeat similar Monitor closely Consider Gen surg consult On PPI, GI cocktail prn   Upper GI bleed Hgb dropped to 8.5, no further bloody BM noted Likely due to gastric ulcer, s/p clipping 1/25 and coil embolization by IR on 2/3 CT abd neg for retroperitoneal bleed Multiple blood transfusion GI on board, if Hgb drops will repeat EGD Clear liquid diet, advance as tolerated PPI BID Daily cbc, monitor closely Telemetry  Hemorrhagic shock Resolved Likely due to above  Acute blood loss anemia secondary to upper GI bleed Hold all anticoagulation Daily H&H  AKI with electrolyte disturbance Resolved Multifactorial including contrast induced Daily BMET, replace as needed, avoid nephrotoxic's  Acute hypoxic respiratory failure Resolved In the setting of severe GI bleed/shock CT chest with 4.1 cm mass in the aortopulmonary window, workup once stable  Paroxysmal A. fib with RVR Currently rate controlled No anticoagulation due to massive GI bleed  Right lower extremity DVT Currently unable to anticoagulate due to massive GI bleed Status post IVC filter placement  E. coli bacteremia Afebrile, no leukocytosis Blood culture x2 no growth till date Completed course of antibiotics    Code Status: Full  Family Communication: Brother at bedside  Disposition Plan: CIR   Consultants:  PCCM  GI  IR  Cardiology  Procedures:  Endoscopy, colonoscopy  IR embolectomy  IVC filter  placement  Intubation  Antimicrobials:  Status post IV ceftriaxone  DVT prophylaxis: SCDs   Objective: Vitals:   04/20/17 0856 04/20/17 1126 04/20/17 1127 04/20/17 1357  BP: (!) 93/43 (!) 106/43 (!) 105/51 (!) 99/37  Pulse: 62   (!) 59  Resp:    18  Temp:    99.5 F (37.5 C)  TempSrc:    Oral  SpO2:    100%  Weight:      Height:        Intake/Output Summary (Last 24 hours) at 04/20/2017 1422 Last data filed at 04/20/2017 1300 Gross per 24 hour  Intake 1320 ml  Output 2100 ml  Net -780 ml   Filed Weights   04/18/17 0423 04/19/17 0500 04/20/17 0500  Weight: 76.9 kg (169 lb 8.5 oz) 76.9 kg (169 lb 8.5 oz) 66.3 kg (146 lb 2.6 oz)    Exam:   General: Alert, awake, NAD  Cardiovascular: S1, S2 present, no added heart sounds  Respiratory: Chest clear bilaterally  Abdomen: Soft, mild gen tenderness, nondistended, bowel sounds present  Musculoskeletal: No pedal edema bilaterally  Skin: Normal  Psychiatry: Normal mood   Data Reviewed: CBC: Recent Labs  Lab 04/16/17 0400  04/17/17 0429 04/17/17 0830 04/17/17 1719 04/18/17 0452 04/19/17 0430 04/20/17 0424  WBC 6.9  --  5.4  --   --  6.5 9.6 6.1  NEUTROABS  --   --   --   --   --   --  7.5 3.8  HGB 7.9*   < > 8.5* 8.8* 9.9* 9.8* 10.5* 8.5*  HCT 24.7*   < > 26.9* 27.8* 31.7* 31.2* 32.8* 27.0*  MCV 95.4  --  97.1  --   --  96.9 95.9 97.1  PLT 52*  --  64*  --   --  81* 111* 103*   < > = values in this interval not displayed.   Basic Metabolic Panel: Recent Labs  Lab 04/14/17 0044 04/15/17 0507  04/17/17 0429 04/18/17 0452 04/19/17 0430 04/19/17 0730 04/19/17 1716 04/20/17 0424  NA 146* 146*   < > 146* 144 142  --  139 138  K 3.8 3.6   < > 2.8* 2.8* 2.4*  --  2.5* 3.7  CL 103 105   < > 103 101 98*  --  99* 102  CO2 31 29   < > 31 30 29   --  28 26  GLUCOSE 131* 92   < > 91 107* 80  --  103* 87  BUN 16 18   < > 15 12 10   --  8 9  CREATININE 1.59* 1.60*   < > 1.38* 1.23* 1.16*  --  1.05* 1.07*    CALCIUM 7.2* 7.5*   < > 7.8* 7.8* 7.9*  --  7.5* 7.4*  MG 2.0  --   --   --   --   --  1.6* 2.6*  --   PHOS 5.6* 4.9*  --   --   --   --   --   --   --    < > = values in this interval not displayed.   GFR: Estimated Creatinine Clearance: 48.6 mL/min (A) (by C-G formula based on SCr of 1.07 mg/dL (H)). Liver Function Tests: Recent Labs  Lab 04/15/17 0507 04/16/17 0400 04/17/17 0429 04/18/17 0452 04/19/17 0430  AST  --  93* 66* 50* 46*  ALT  --  152* 125* 101* 85*  ALKPHOS  --  40 43 49 58  BILITOT  --  0.8 0.6 0.8 1.0  PROT  --  3.5* 3.8* 4.4* 4.8*  ALBUMIN 1.5* 1.5* 1.7* 1.9* 2.1*   Recent Labs  Lab 04/19/17 0430  LIPASE 30   No results for input(s): AMMONIA in the last 168 hours. Coagulation Profile: Recent Labs  Lab 04/14/17 0920 04/15/17 0755  INR 1.27 1.19   Cardiac Enzymes: Recent Labs  Lab 04/19/17 0430  TROPONINI <0.03   BNP (last 3 results) No results for input(s): PROBNP in the last 8760 hours. HbA1C: No results for input(s): HGBA1C in the last 72 hours. CBG: Recent Labs  Lab 04/19/17 1950 04/19/17 2355 04/20/17 0453 04/20/17 0749 04/20/17 1203  GLUCAP 136* 96 95 84 81   Lipid Profile: No results for input(s): CHOL, HDL, LDLCALC, TRIG, CHOLHDL, LDLDIRECT in the last 72 hours. Thyroid Function Tests: No results for input(s): TSH, T4TOTAL, FREET4, T3FREE, THYROIDAB in the last 72 hours. Anemia Panel: No results for input(s): VITAMINB12, FOLATE, FERRITIN, TIBC, IRON, RETICCTPCT in the last 72 hours. Urine analysis:    Component Value Date/Time   COLORURINE YELLOW 04/02/2017 1705   APPEARANCEUR CLOUDY (A) 04/02/2017 1705   LABSPEC 1.008 04/02/2017 1705   PHURINE 5.0 04/02/2017 1705   GLUCOSEU NEGATIVE 04/02/2017 1705   HGBUR LARGE (A) 04/02/2017 1705   BILIRUBINUR NEGATIVE 04/02/2017 1705   KETONESUR NEGATIVE 04/02/2017 1705   PROTEINUR NEGATIVE 04/02/2017 1705   NITRITE NEGATIVE 04/02/2017 1705   LEUKOCYTESUR LARGE (A) 04/02/2017 1705    Sepsis Labs: @LABRCNTIP (procalcitonin:4,lacticidven:4)  )No results found for this or any previous visit (from the past 240 hour(s)).    Studies: Ct Head Wo Contrast  Result Date: 04/19/2017 CLINICAL DATA:  Seizure activity. EXAM: CT HEAD WITHOUT CONTRAST TECHNIQUE: Contiguous axial images were obtained from the base of the skull through the vertex without intravenous contrast. COMPARISON:  None. FINDINGS: Brain: No acute infarct, hemorrhage, or mass lesion is present. The ventricles are of normal size. No significant extraaxial fluid collection is present. Brainstem and cerebellum are normal. Vascular: Vascular calcifications are present within the right cavernous internal carotid artery. There is no hyperdense vessel. Skull: Calvarium is intact. No focal lytic or blastic lesions are present. No significant extracranial soft tissue injury is present. Sinuses/Orbits: The paranasal sinuses and mastoid air cells are clear. Globes and orbits are within normal limits. IMPRESSION: 1. Atherosclerosis. 2. Otherwise negative CT of the head. Electronically Signed   By: San Morelle M.D.   On: 04/19/2017 18:09   Dg Abd 2 Views  Result Date: 04/19/2017 CLINICAL DATA:  Lower abdominal pain for a couple of days. Small bowel obstruction. EXAM: ABDOMEN - 2 VIEW COMPARISON:  04/18/2017 FINDINGS: Prominence of loops of small bowel, without overt dilation. Few air-fluid levels noted on the erect view. No free air. Wire coils are noted in the left upper quadrant, stable. There is a vena cava filter projecting over the right aspect of L2. Opacity is noted at the lung bases consistent with combination of pleural effusions and atelectasis or infiltrate. IMPRESSION: 1. Prominent small bowel loops with a few air-fluid levels. This could reflect a low grade small bowel obstruction. Mild adynamic ileus is felt more likely. The appearance is similar to the previous day's exam. 2. No free air. Electronically Signed    By: Lajean Manes M.D.   On: 04/19/2017 18:19    Scheduled Meds: . arformoterol  15 mcg Nebulization BID  .  budesonide (PULMICORT) nebulizer solution  0.5 mg Nebulization BID  . Chlorhexidine Gluconate Cloth  6 each Topical Daily  . feeding supplement  1 Container Oral TID BM  . Gerhardt's butt cream   Topical TID  . insulin aspart  2-6 Units Subcutaneous Q4H  . pantoprazole  40 mg Oral BID  . sodium chloride flush  3 mL Intravenous Q12H    Continuous Infusions: . sodium chloride       LOS: 18 days     Alma Friendly, MD [facility]   If 7PM-7AM, please contact night-coverage [email or web address] Password [email or web address] 04/20/2017, 2:22 PM

## 2017-04-20 NOTE — Procedures (Signed)
ELECTROENCEPHALOGRAM REPORT  Date of Study: 04/20/2017  Patient's Name: Kayla Wyatt MRN: [id number] Date of Birth: 06/06/1955  Referring Provider: Dr. Lesia Sago  Clinical History: This is a 62 year old woman with an episode of generalized shakes/shivering, yelling for help.  Medications: GI cocktail Protonix Insulin Tylenol  Technical Summary: A multichannel digital EEG recording measured by the international 10-20 system with electrodes applied with paste and impedances below 5000 ohms performed in our laboratory with EKG monitoring in an awake and asleep patient.  Hyperventilation was not performed. Photic stimulation was performed.  The digital EEG was referentially recorded, reformatted, and digitally filtered in a variety of bipolar and referential montages for optimal display.    Description: The patient is awake and asleep during the recording.  During maximal wakefulness, there is a symmetric, medium voltage 10 Hz posterior dominant rhythm that attenuates with eye opening.  There is frequent focal 4-5 hz theta slowing over the right temporo-occipital region. During drowsiness and sleep, there is an increase in theta slowing of the background with occasional vertex waves seen.  Photic stimulation did not elicit any abnormalities.  There were occasional sharp waves over the right occipital region. No electrographic seizures seen.    EKG lead showed irregular rhythm.  Impression: This awake and asleep EEG is abnormal due to the presence of: 1. Focal slowing over the right temporo-occipital region 2. Occasional epileptiform discharges over the right occipital region  Clinical Correlation of the above findings indicates focal cerebral dysfunction over the right temporo-occipital region suggestive of underlying structural or physiologic abnormality. There is a possible tendency for seizures to arise from the right occipital region. Clinical correlation is  advised.    Ellouise Newer, M.D.

## 2017-04-20 NOTE — Progress Notes (Signed)
Physical Therapy Treatment Patient Details Name: Kayla Wyatt MRN: [id number] DOB: 24-Mar-1955 Today's Date: 04/20/2017    History of Present Illness Pt is a 62 y/o female admitted on 04/02/17 secondary to abdominal pain and diarrhea. Pt found to have anemia secondary to a GI bleed. Pt's course complicated due to an acute R LE DVT and now s/p IVC filter placement on 2/3. Pt hypotensive and tachycardic requiring intubation from 2/3 to 2/6 with an emergent EGD. PMH including but not limited to HTN.    PT Comments    Pt is making good progress towards her goals, however continues to be limited by pain in her abdomen, decreased endurance and decreased strength. Pt is min A for bed mobility, transfers and ambulation of 2x 6 feet with RW. D/c plan remains appropriate at this time. PT will continue to follow acutely.    Follow Up Recommendations  CIR;Supervision/Assistance - 24 hour     Equipment Recommendations  None recommended by PT;Other (comment)(TBD)    Recommendations for Other Services Rehab consult     Precautions / Restrictions Precautions Precautions: Fall Restrictions Weight Bearing Restrictions: No    Mobility  Bed Mobility Overal bed mobility: Needs Assistance Bed Mobility: Supine to Sit   Sidelying to sit: Min assist       General bed mobility comments: minA for trunk to upright, use of bedrails to pull to EoB  Transfers Overall transfer level: Needs assistance Equipment used: Rolling walker (2 wheeled) Transfers: Sit to/from [facility] Sit to Stand: Min assist         General transfer comment: minA for power up to standing, good steadying  Ambulation/Gait Ambulation/Gait assistance: Min assist Ambulation Distance (Feet): 12 Feet Assistive device: Rolling walker (2 wheeled) Gait Pattern/deviations: Step-through pattern;Decreased step length - right;Decreased step length - left;Trunk flexed Gait velocity: slowed Gait velocity  interpretation: Below normal speed for age/gender General Gait Details: minA for steadying, pt walked 6 feet required seated rest break and then was able to return to bed       Balance Overall balance assessment: Needs assistance Sitting-balance support: Feet supported Sitting balance-Leahy Scale: Good Sitting balance - Comments: sat EOB to eat her breakfast x5 mins   Standing balance support: Bilateral upper extremity supported Standing balance-Leahy Scale: Poor Standing balance comment: able to stand with RW support                             Cognition Arousal/Alertness: Awake/alert Behavior During Therapy: Anxious Overall Cognitive Status: Within Functional Limits for tasks assessed                                           General Comments General comments (skin integrity, edema, etc.): pt on 2L O2 via nasal cannula at saO2 of 95%O2 on entry, supplemental O2 removed for ambulation SaO2 dropped to 88%O2 but quickly rebounded with instruction for pursed lipped breathing. BP in supine 97/49, in stitting 98/62, with ambulation 103/38  Pt returned to bed for nap so suppplemental cxygen via nasal cannula was returned       Pertinent Vitals/Pain Pain Assessment: 0-10 Pain Score: 4  Pain Location: abdomen Pain Descriptors / Indicators: Cramping Pain Intervention(s): Limited activity within patient's tolerance;Monitored during session           PT Goals (current goals can now be found  in the care plan section) Acute Rehab PT Goals Patient Stated Goal: return to independence PT Goal Formulation: With patient Time For Goal Achievement: 04/30/17 Potential to Achieve Goals: Good Progress towards PT goals: Progressing toward goals    Frequency    Min 3X/week      PT Plan Current plan remains appropriate       AM-PAC PT "6 Clicks" Daily Activity  Outcome Measure  Difficulty turning over in bed (including adjusting bedclothes, sheets and  blankets)?: A Little Difficulty moving from lying on back to sitting on the side of the bed? : Unable Difficulty sitting down on and standing up from a chair with arms (e.g., wheelchair, bedside commode, etc,.)?: Unable Help needed moving to and from a bed to chair (including a wheelchair)?: A Lot Help needed walking in hospital room?: A Lot Help needed climbing 3-5 steps with a railing? : Total 6 Click Score: 10    End of Session Equipment Utilized During Treatment: Gait belt Activity Tolerance: Patient limited by fatigue;Patient limited by pain Patient left: in chair;with chair alarm set;with call bell/phone within reach;with nursing/sitter in room Nurse Communication: Mobility status PT Visit Diagnosis: Other abnormalities of gait and mobility (R26.89);Pain Pain - part of body: (abdomen)     Time: 1916-6060 PT Time Calculation (min) (ACUTE ONLY): 24 min  Charges:  $Gait Training: 8-22 mins $Therapeutic Activity: 8-22 mins                    G Codes:       Shameria Trimarco B. Migdalia Dk PT, DPT Acute Rehabilitation  [phone] Pager [phone]     [place] 04/20/2017, 5:55 PM

## 2017-04-20 NOTE — Progress Notes (Signed)
Occupational Therapy Treatment Patient Details Name: Kayla Wyatt MRN: [id number] DOB: 03-23-1955 Today's Date: 04/20/2017    History of present illness Pt is a 62 y/o female admitted on 04/02/17 secondary to abdominal pain and diarrhea. Pt found to have anemia secondary to a GI bleed. Pt's course complicated due to an acute R LE DVT and now s/p IVC filter placement on 2/3. Pt hypotensive and tachycardic requiring intubation from 2/3 to 2/6 with an emergent EGD. PMH including but not limited to HTN.   OT comments  Pt continues to make improvements with mobility. However, hypotension continues to be a barrier for therapy. Pt reports feeling unwell and anxious but agreeable to participate with encouragement. Pt sitting on EOB for 10 minutes and BP was 106/43. Pt standing from EOB with min A and reports dizziness and numbness in B LEs. BP results of 105/51 in standing. Pt marching in place for 1 minute but reports not feeling better and requesting to return to bed. RN present. Pt agreeable to sitting on EOB with family member present to increase activity tolerance. Pt continues to benefit from OT intervention.   Follow Up Recommendations  CIR    Equipment Recommendations  Other (comment)(defer to next venue of care)    Recommendations for Other Services Rehab consult    Precautions / Restrictions Precautions Precautions: Fall Restrictions Weight Bearing Restrictions: No       Mobility Bed Mobility Overal bed mobility: Needs Assistance Bed Mobility: Supine to Sit Rolling: Min assist Sidelying to sit: Min assist Supine to sit: Min assist     General bed mobility comments: use of rail and min A to help with trunk  Transfers                      Balance Overall balance assessment: Needs assistance Sitting-balance support: Feet supported Sitting balance-Leahy Scale: Good Sitting balance - Comments: sat EOB with family member with close supervision x 10 min    Standing  balance support: Bilateral upper extremity supported Standing balance-Leahy Scale: Fair Standing balance comment: Pt holding tightly to therapist and required min A fot static standing.                             Cognition Arousal/Alertness: Awake/alert Behavior During Therapy: Anxious Overall Cognitive Status: Within Functional Limits for tasks assessed         General Comments: anxious about movement and requiring increased motivation for participation                   Pertinent Vitals/ Pain       Pain Assessment: 0-10 Pain Score: 6  Pain Location: abdomen Pain Descriptors / Indicators: Aching;Discomfort Pain Intervention(s): Monitored during session;Repositioned         Frequency  Min 3X/week        Progress Toward Goals  OT Goals(current goals can now be found in the care plan section)  Progress towards OT goals: Progressing toward goals     Plan Discharge plan remains appropriate       AM-PAC PT "6 Clicks" Daily Activity     Outcome Measure   Help from another person eating meals?: None Help from another person taking care of personal grooming?: A Little Help from another person toileting, which includes using toliet, bedpan, or urinal?: A Lot Help from another person bathing (including washing, rinsing, drying)?: A Lot Help from another person to put on and  taking off regular upper body clothing?: A Little Help from another person to put on and taking off regular lower body clothing?: A Lot 6 Click Score: 16    End of Session Equipment Utilized During Treatment: Oxygen  OT Visit Diagnosis: Unsteadiness on feet (R26.81);Pain;Muscle weakness (generalized) (M62.81)   Activity Tolerance Patient limited by fatigue;Patient limited by pain   Patient Left in chair;with call bell/phone within reach;with family/visitor present   Nurse Communication Other (comment)(RN present at end of session and pt remaining on EOB as therapist exited the  room.)        Time: 1115-1140 OT Time Calculation (min): 25 min  Charges: OT General Charges $OT Visit: 1 Visit OT Treatments $Therapeutic Activity: 23-37 mins(25)    Desirey Keahey P, MS, OTR/L, CBIS 04/20/2017, 12:19 PM

## 2017-04-20 NOTE — Consult Note (Signed)
NEURO HOSPITALIST CONSULT NOTE   Requestig physician: Dr. Horris Latino  Reason for Consult: Seizure  History obtained from:  Patient    HPI:                                                                                                                                          Kayla Wyatt is an 61 y.o. female with no history of seizure who is in the hospital for [facility] and then found to have right LE DVT and lace on Xarelto 2/1. 2/2 patient had hemoptysis and Xarelto was stopped and given 4 units RBCs &2 FFP. 2/3 in the early morning patient was found minimally responsive and hypotensive, requiring intubation and vent support. Patient was stabilized and placed on a medical surgical floor.  Today at [place] husband noted sudden onset of her staring into space with bilateral jerking and yelling help. Patient has no recollection of event. This lasted for about 3 minutes and then, per husband, she returned to baseline. EEG obtained revealed focal slowing over the right temporo-occipital region and occasional epileptiform discharges over the right occipital region. As of right now she is on no AEDs. She is back to her baseline. She has no prior history of seizure.  Past Medical History:  Diagnosis Date  . Acute blood loss anemia 04/02/2017  . Gastrointestinal hemorrhage with melena   . Hypercholesteremia   . Hypertension   . Persistent atrial fibrillation with rapid ventricular response ([place]) 04/07/2017  . Vitamin D deficiency     Past Surgical History:  Procedure Laterality Date  . COLONOSCOPY Left 04/09/2017   Procedure: COLONOSCOPY;  Surgeon: Carol Ada, MD;  Location: [place];  Service: Endoscopy;  Laterality: Left;  . ESOPHAGOGASTRODUODENOSCOPY N/A 04/03/2017   Procedure: ESOPHAGOGASTRODUODENOSCOPY (EGD);  Surgeon: Carol Ada, MD;  Location: [place];  Service: Endoscopy;  Laterality: N/A;  . ESOPHAGOGASTRODUODENOSCOPY (EGD) WITH PROPOFOL N/A 04/12/2017    Procedure: ESOPHAGOGASTRODUODENOSCOPY (EGD) WITH PROPOFOL;  Surgeon: Milus Banister, MD;  Location[facility] ENDOSCOPY;  Service: Endoscopy;  Laterality: N/A;  . IR ANGIOGRAM SELECTIVE EACH ADDITIONAL VESSEL  04/12/2017  . IR ANGIOGRAM SELECTIVE EACH ADDITIONAL VESSEL  04/13/2017  . IR ANGIOGRAM SELECTIVE EACH ADDITIONAL VESSEL  04/13/2017  . IR ANGIOGRAM SELECTIVE EACH ADDITIONAL VESSEL  04/13/2017  . IR ANGIOGRAM VISCERAL SELECTIVE  04/12/2017  . IR ANGIOGRAM VISCERAL SELECTIVE  04/13/2017  . IR EMBO ART  VEN HEMORR LYMPH EXTRAV  INC GUIDE ROADMAPPING  04/12/2017  . IR EMBO ART  VEN HEMORR LYMPH EXTRAV  INC GUIDE ROADMAPPING  04/13/2017  . IR FLUORO GUIDE CV LINE RIGHT  04/13/2017  . IR IVC FILTER PLMT / S&I /IMG GUID/MOD SED  04/12/2017  . IR US GUIDE VASC ACCESS RIGHT  04/12/2017  . IR US GUIDE VASC  ACCESS RIGHT  04/13/2017  . IR US GUIDE VASC ACCESS RIGHT  04/13/2017  . LEG SURGERY  1974   Blood Clot Removal     Family History  Problem Relation Age of Onset  . Heart disease Father      Social History:  reports that she has been smoking.  she has never used smokeless tobacco. She reports that she does not drink alcohol or use drugs.  No Known Allergies  MEDICATIONS:                                                                                                                     Prior to Admission:  Medications Prior to Admission  Medication Sig Dispense Refill Last Dose  . aspirin 81 MG tablet Take 81 mg by mouth daily.   04/01/2017 at Unknown time  . cholecalciferol (VITAMIN D) 1000 units tablet Take 1,000 Units by mouth 2 (two) times daily.   04/01/2017 at Unknown time  . lisinopril-hydrochlorothiazide (PRINZIDE,ZESTORETIC) 20-12.5 MG tablet Take 1 tablet by mouth daily.   04/01/2017 at Unknown time  . simvastatin (ZOCOR) 20 MG tablet Take 1 tablet by mouth daily.  1 04/01/2017 at Unknown time   Scheduled: . arformoterol  15 mcg Nebulization BID  . budesonide (PULMICORT) nebulizer solution  0.5 mg  Nebulization BID  . Chlorhexidine Gluconate Cloth  6 each Topical Daily  . feeding supplement  1 Container Oral TID BM  . Gerhardt's butt cream   Topical TID  . insulin aspart  2-6 Units Subcutaneous Q4H  . pantoprazole  40 mg Oral BID  . sodium chloride flush  3 mL Intravenous Q12H   Continuous: . sodium chloride       ROS:                                                                                                                                       History obtained from the patient  General ROS: negative for - chills, fatigue, fever, night sweats, weight gain or weight loss Psychological ROS: negative for - behavioral disorder, hallucinations, memory difficulties, mood swings or suicidal ideation Ophthalmic ROS: negative for - blurry vision, double vision, eye pain or loss of vision ENT ROS: negative for - epistaxis, nasal discharge, oral lesions, sore throat, tinnitus or vertigo Allergy and Immunology ROS: negative for - hives or itchy/watery eyes Hematological and Lymphatic ROS: negative for - bleeding  problems, bruising or swollen lymph nodes Endocrine ROS: negative for - galactorrhea, hair pattern changes, polydipsia/polyuria or temperature intolerance Respiratory ROS: negative for - cough, hemoptysis, shortness of breath or wheezing Cardiovascular ROS: negative for - chest pain, dyspnea on exertion, edema or irregular heartbeat Gastrointestinal ROS: negative for - abdominal pain, diarrhea, hematemesis, nausea/vomiting or stool incontinence Genito-Urinary ROS: negative for - dysuria, hematuria, incontinence or urinary frequency/urgency Musculoskeletal ROS: negative for - joint swelling or muscular weakness Neurological ROS: as noted in HPI Dermatological ROS: negative for rash and skin lesion changes   Blood pressure (!) 97/41, pulse (!) 59, temperature 99.5 F (37.5 C), temperature source Oral, resp. rate 18, height 5\' 3"  (1.6 m), weight 66.3 kg (146 lb 2.6 oz), SpO2 100  %.   General Examination:                                                                                                       Physical Exam  HEENT-  Normocephalic, no lesions, without obvious abnormality.  Normal external eye and conjunctiva.   Cardiovascular- S1-S2 audible, pulses palpable throughout   Lungs-no rhonchi or wheezing noted, no excessive working breathing.  Saturations within normal limits Abdomen- All 4 quadrants palpated and nontender Extremities- Warm, dry and intact Musculoskeletal-no joint tenderness, deformity or swelling Skin-warm and dry, no hyperpigmentation, vitiligo, or suspicious lesions  Neurological Examination Mental Status: Alert, oriented, thought content appropriate.  Speech fluent without evidence of aphasia.  Able to follow 3 step commands without difficulty. Cranial Nerves: II: Discs flat bilaterally; Visual fields grossly normal,  III,IV, VI: ptosis not present, extra-ocular motions intact bilaterally pupils equal, round, reactive to light and accommodation V,VII: smile symmetric, facial light touch sensation normal bilaterally VIII: hearing normal bilaterally IX,X: uvula rises symmetrically XI: bilateral shoulder shrug XII: midline tongue extension Motor: 4/5 through and weak Sensory: Pinprick and light touch intact throughout, bilaterally Deep Tendon Reflexes: 1+ and symmetric throughout Plantars: Right: downgoing   Left: downgoing Cerebellar: normal finger-to-nose, and normal heel-to-shin test Gait: not tested  Lab Results: Basic Metabolic Panel: Recent Labs  Lab 04/14/17 0044 04/15/17 0507  04/17/17 0429 04/18/17 0452 04/19/17 0430 04/19/17 0730 04/19/17 1716 04/20/17 0424  NA 146* 146*   < > 146* 144 142  --  139 138  K 3.8 3.6   < > 2.8* 2.8* 2.4*  --  2.5* 3.7  CL 103 105   < > 103 101 98*  --  99* 102  CO2 31 29   < > 31 30 29   --  28 26  GLUCOSE 131* 92   < > 91 107* 80  --  103* 87  BUN 16 18   < > 15 12 10   --  8 9   CREATININE 1.59* 1.60*   < > 1.38* 1.23* 1.16*  --  1.05* 1.07*  CALCIUM 7.2* 7.5*   < > 7.8* 7.8* 7.9*  --  7.5* 7.4*  MG 2.0  --   --   --   --   --  1.6* 2.6*  --  PHOS 5.6* 4.9*  --   --   --   --   --   --   --    < > = values in this interval not displayed.    CBC: Recent Labs  Lab 04/16/17 0400  04/17/17 0429 04/17/17 0830 04/17/17 1719 04/18/17 0452 04/19/17 0430 04/20/17 0424  WBC 6.9  --  5.4  --   --  6.5 9.6 6.1  NEUTROABS  --   --   --   --   --   --  7.5 3.8  HGB 7.9*   < > 8.5* 8.8* 9.9* 9.8* 10.5* 8.5*  HCT 24.7*   < > 26.9* 27.8* 31.7* 31.2* 32.8* 27.0*  MCV 95.4  --  97.1  --   --  96.9 95.9 97.1  PLT 52*  --  64*  --   --  81* 111* 103*   < > = values in this interval not displayed.    Cardiac Enzymes: Recent Labs  Lab 04/19/17 0430  TROPONINI <0.03    Lipid Panel: No results for input(s): CHOL, TRIG, HDL, CHOLHDL, VLDL, LDLCALC in the last 168 hours.  Imaging: Ct Head Wo Contrast  Result Date: 04/19/2017 CLINICAL DATA:  Seizure activity. EXAM: CT HEAD WITHOUT CONTRAST TECHNIQUE: Contiguous axial images were obtained from the base of the skull through the vertex without intravenous contrast. COMPARISON:  None. FINDINGS: Brain: No acute infarct, hemorrhage, or mass lesion is present. The ventricles are of normal size. No significant extraaxial fluid collection is present. Brainstem and cerebellum are normal. Vascular: Vascular calcifications are present within the right cavernous internal carotid artery. There is no hyperdense vessel. Skull: Calvarium is intact. No focal lytic or blastic lesions are present. No significant extracranial soft tissue injury is present. Sinuses/Orbits: The paranasal sinuses and mastoid air cells are clear. Globes and orbits are within normal limits. IMPRESSION: 1. Atherosclerosis. 2. Otherwise negative CT of the head. Electronically Signed   By: San Morelle M.D.   On: 04/19/2017 18:09   Dg Abd 2 Views  Result Date:  04/19/2017 CLINICAL DATA:  Lower abdominal pain for a couple of days. Small bowel obstruction. EXAM: ABDOMEN - 2 VIEW COMPARISON:  04/18/2017 FINDINGS: Prominence of loops of small bowel, without overt dilation. Few air-fluid levels noted on the erect view. No free air. Wire coils are noted in the left upper quadrant, stable. There is a vena cava filter projecting over the right aspect of L2. Opacity is noted at the lung bases consistent with combination of pleural effusions and atelectasis or infiltrate. IMPRESSION: 1. Prominent small bowel loops with a few air-fluid levels. This could reflect a low grade small bowel obstruction. Mild adynamic ileus is felt more likely. The appearance is similar to the previous day's exam. 2. No free air. Electronically Signed   By: Lajean Manes M.D.   On: 04/19/2017 18:19    Assessment and plan per attending neurologist Etta Quill PA-C [facility] [phone] 04/20/2017, 3:55 PM   Assessment:  61 year old female with new onset seizure.  1. Head CT from yesterday did not show any abnormality.  2. Neurological exam is non-focal.  3. EEG reveals focal slowing over the right temporo-occipital region and occasional epileptiform discharges over the right occipital region 4. Hypocalcemia. Not severe and not felt likely to be the primary etiology for her seizure given the focality seen on EEG. However, it could lower the seizure threshold 5. Mild hypermagnesemia  Recommendations: 1)  Keppra 500  mg BID 2)  MRI brain w/wo contrast 3)  Seizure precautions 4) Per [facility] statutes, patients with seizures are not allowed to drive until  they have been seizure-free for six months. Use caution when using heavy equipment or power tools. Avoid working on ladders or at heights. Take showers instead of baths. Ensure the water temperature is not too high on the home water heater. Do not go swimming alone. When caring for infants or small children, sit down  when holding, feeding, or changing them to minimize risk of injury to the child in the event you have a seizure. Also, Maintain good sleep hygiene. Avoid alcohol. 5. Monitor and correct serum calcium  Electronically signed: Dr. Kerney Elbe

## 2017-04-21 ENCOUNTER — Encounter (HOSPITAL_COMMUNITY): Payer: Self-pay | Admitting: *Deleted

## 2017-04-21 ENCOUNTER — Inpatient Hospital Stay (HOSPITAL_COMMUNITY): Payer: BLUE CROSS/BLUE SHIELD

## 2017-04-21 LAB — BASIC METABOLIC PANEL
Anion gap: 8 (ref 5–15)
BUN: 8 mg/dL (ref 6–20)
CO2: 24 mmol/L (ref 22–32)
Calcium: 7.8 mg/dL — ABNORMAL LOW (ref 8.9–10.3)
Chloride: 109 mmol/L (ref 101–111)
Creatinine, Ser: 0.94 mg/dL (ref 0.44–1.00)
GFR calc Af Amer: >60 mL/min (ref >60)
GFR calc non Af Amer: >60 mL/min (ref >60)
Glucose, Bld: 85 mg/dL (ref 65–99)
Potassium: 3.6 mmol/L (ref 3.5–5.1)
Sodium: 141 mmol/L (ref 135–145)

## 2017-04-21 LAB — GLUCOSE, CAPILLARY
Glucose-Capillary: 104 mg/dL — ABNORMAL HIGH (ref 65–99)
Glucose-Capillary: 105 mg/dL — ABNORMAL HIGH (ref 65–99)
Glucose-Capillary: 73 mg/dL (ref 65–99)
Glucose-Capillary: 77 mg/dL (ref 65–99)
Glucose-Capillary: 81 mg/dL (ref 65–99)
Glucose-Capillary: 85 mg/dL (ref 65–99)

## 2017-04-21 LAB — CBC WITH DIFFERENTIAL/PLATELET
Basophils Absolute: 0 10*3/uL (ref 0.0–0.1)
Basophils Relative: 0 %
Eosinophils Absolute: 0.2 10*3/uL (ref 0.0–0.7)
Eosinophils Relative: 5 %
HCT: 27.6 % — ABNORMAL LOW (ref 36.0–46.0)
Hemoglobin: 8.5 g/dL — ABNORMAL LOW (ref 12.0–15.0)
Lymphocytes Relative: 30 %
Lymphs Abs: 1.5 10*3/uL (ref 0.7–4.0)
MCH: 30.4 pg (ref 26.0–34.0)
MCHC: 30.8 g/dL (ref 30.0–36.0)
MCV: 98.6 fL (ref 78.0–100.0)
Monocytes Absolute: 0.4 10*3/uL (ref 0.1–1.0)
Monocytes Relative: 8 %
Neutro Abs: 2.8 10*3/uL (ref 1.7–7.7)
Neutrophils Relative %: 57 %
Platelets: 124 10*3/uL — ABNORMAL LOW (ref 150–400)
RBC: 2.8 MIL/uL — ABNORMAL LOW (ref 3.87–5.11)
RDW: 17.5 % — ABNORMAL HIGH (ref 11.5–15.5)
WBC: 4.8 10*3/uL (ref 4.0–10.5)

## 2017-04-21 MED ORDER — LORAZEPAM 0.5 MG PO TABS
0.5000 mg | ORAL_TABLET | Freq: Once | ORAL | Status: AC
  Administered 2017-04-21: 0.5 mg via ORAL
  Filled 2017-04-21: qty 1

## 2017-04-21 MED ORDER — LORAZEPAM 2 MG/ML IJ SOLN: 0.5000 mg | Freq: Once | INTRAMUSCULAR | Status: DC | PRN

## 2017-04-21 MED ORDER — GADOBENATE DIMEGLUMINE 529 MG/ML IV SOLN
15.0000 mL | Freq: Once | INTRAVENOUS | Status: AC | PRN
  Administered 2017-04-22: 13 mL via INTRAVENOUS

## 2017-04-21 NOTE — Progress Notes (Signed)
PROGRESS NOTE  Kayla Wyatt NWG:[id number] DOB: Nov 27, 1955 DOA: 04/02/2017 PCP: [name], MD  HPI/Recap of past 39 hours: 62 year old female with history of hypertension, hyperlipidemia presented on 04/02/17 with reported two days of ABD pain and bloody diarrhea. States she has had 15+ bowel movements in the morning of arrival. GI consulted. EGD 1/25 with single visible vessel in the setting of a mild ulceration was found in the stomach in which was clipped. Patient continued to have anemia, hypotension, and melena. Was taken for colonoscopy on 1/31 in which revealed diverticulosis. CTA to check for PE showed a 4.1 cm mass in the mediastinum with scattered lymphadenopathy and a right pleural effusion  During stay patient was found to have right LE DVT and started on Xarelto on 2/1. 2/2 patient had hemoptysis and Xarelto was stopped and given 4 units RBCs & 2 FFP. 2/3 in the early morning patient was found minimally responsive and hypotensive, requiring intubation and vent support. Transferred to ICU and GI called.  Had IR embolization. She was subsequently given Kcentra, 1 pack of platelets and 2 additional PRBC. Pt was started on pressor support, subsequently weaned off and extubated.  Patient transferred back to [facility].  Today, pt noted to be more alert, denied any new complaints. Advanced diet, able to tolerate. Denies any chest pain, worsening SOB, abdominal pain, N/V/D/C, fever/chills   Assessment/Plan: Principal Problem:   Gastrointestinal hemorrhage with melena Active Problems:   Acute blood loss anemia   Lower abdominal pain of unknown etiology   Abnormal computed tomography angiography (CTA) of abdomen and pelvis   Persistent atrial fibrillation with rapid ventricular response ([place])   Encounter for intubation   Respiratory failure (HCC)  Seizure No hx of prior seizure Noted to have an episode of generalized shakes/shiver while yelling "help me" at the same  time Witnessed by brother in room, lasted for few mins, no tongue biting/incontinence Noted with severe hypokalemia/hypomagnesemia, ??likely cause CT head neg EEG with focal slowing over the right temporo-occipital region and occasional epileptiform discharges over the right occipital region Replace electrolytes, monitor closely Seizure precautions Neurology consulted: Rec starting Keppra BID and MRI head  Abdominal/epigastric pain Resolved Afebrile, no leukocytosis Passing gas, moving bowels Abd xray cannot exclude early SBO, repeat CXR with similar changes Consider Gen surg consult if re-occurs or worsens On PPI, GI cocktail prn   Upper GI bleed Hgb dropped to 8.5, no further bloody BM noted Likely due to gastric ulcer, s/p clipping 1/25 and coil embolization by IR on 2/3 CT abd neg for retroperitoneal bleed Multiple blood transfusion GI on board, if Hgb drops will repeat EGD PPI BID Daily cbc, monitor closely Telemetry  Hemorrhagic shock Resolved Likely due to above  Acute blood loss anemia secondary to upper GI bleed Hold all anticoagulation Daily H&H  AKI with electrolyte disturbance Resolved Multifactorial including contrast induced Daily BMET, replace as needed, avoid nephrotoxic's  Acute hypoxic respiratory failure Resolved In the setting of severe GI bleed/shock CT chest with 4.1 cm mass in the aortopulmonary window, workup once stable  Paroxysmal A. fib with RVR Currently rate controlled No anticoagulation due to massive GI bleed  Right lower extremity DVT Currently unable to anticoagulate due to massive GI bleed Status post IVC filter placement  E. coli bacteremia Afebrile, no leukocytosis Blood culture x2 no growth till date Completed course of antibiotics    Code Status: Full  Family Communication: Brother at bedside  Disposition Plan: CIR, hopefully 04/22/17   Consultants:  PCCM  GI  IR  Cardiology  Procedures:  Endoscopy,  colonoscopy  IR embolectomy  IVC filter placement  Intubation  Antimicrobials:  Status post IV ceftriaxone  DVT prophylaxis: SCDs   Objective: Vitals:   04/21/17 0442 04/21/17 0541 04/21/17 0930 04/21/17 1326  BP:  (!) 112/37  (!) 101/38  Pulse:  (!) 55 84 (!) 102  Resp:  18 18 18   Temp:  98.2 F (36.8 C)  98 F (36.7 C)  TempSrc:    Oral  SpO2:  96% 95% 94%  Weight: 64.1 kg (141 lb 5 oz)     Height:        Intake/Output Summary (Last 24 hours) at 04/21/2017 1541 Last data filed at 04/21/2017 1330 Gross per 24 hour  Intake 705 ml  Output 750 ml  Net -45 ml   Filed Weights   04/19/17 0500 04/20/17 0500 04/21/17 0442  Weight: 76.9 kg (169 lb 8.5 oz) 66.3 kg (146 lb 2.6 oz) 64.1 kg (141 lb 5 oz)    Exam:   General: Alert, awake, NAD  Cardiovascular: S1, S2 present, no added heart sounds  Respiratory: Chest clear bilaterally  Abdomen: Soft, non-tender, nondistended, bowel sounds present  Musculoskeletal: No pedal edema bilaterally  Skin: Normal  Psychiatry: Normal mood   Data Reviewed: CBC: Recent Labs  Lab 04/17/17 0429  04/17/17 1719 04/18/17 0452 04/19/17 0430 04/20/17 0424 04/21/17 0440  WBC 5.4  --   --  6.5 9.6 6.1 4.8  NEUTROABS  --   --   --   --  7.5 3.8 2.8  HGB 8.5*   < > 9.9* 9.8* 10.5* 8.5* 8.5*  HCT 26.9*   < > 31.7* 31.2* 32.8* 27.0* 27.6*  MCV 97.1  --   --  96.9 95.9 97.1 98.6  PLT 64*  --   --  81* 111* 103* 124*   < > = values in this interval not displayed.   Basic Metabolic Panel: Recent Labs  Lab 04/15/17 0507  04/18/17 0452 04/19/17 0430 04/19/17 0730 04/19/17 1716 04/20/17 0424 04/21/17 0440  NA 146*   < > 144 142  --  139 138 141  K 3.6   < > 2.8* 2.4*  --  2.5* 3.7 3.6  CL 105   < > 101 98*  --  99* 102 109  CO2 29   < > 30 29  --  28 26 24   GLUCOSE 92   < > 107* 80  --  103* 87 85  BUN 18   < > 12 10  --  8 9 8   CREATININE 1.60*   < > 1.23* 1.16*  --  1.05* 1.07* 0.94  CALCIUM 7.5*   < > 7.8* 7.9*  --   7.5* 7.4* 7.8*  MG  --   --   --   --  1.6* 2.6*  --   --   PHOS 4.9*  --   --   --   --   --   --   --    < > = values in this interval not displayed.   GFR: Estimated Creatinine Clearance: 54.5 mL/min (by C-G formula based on SCr of 0.94 mg/dL). Liver Function Tests: Recent Labs  Lab 04/15/17 0507 04/16/17 0400 04/17/17 0429 04/18/17 0452 04/19/17 0430  AST  --  93* 66* 50* 46*  ALT  --  152* 125* 101* 85*  ALKPHOS  --  40 43 49 58  BILITOT  --  0.8 0.6 0.8 1.0  PROT  --  3.5* 3.8* 4.4* 4.8*  ALBUMIN 1.5* 1.5* 1.7* 1.9* 2.1*   Recent Labs  Lab 04/19/17 0430  LIPASE 30   No results for input(s): AMMONIA in the last 168 hours. Coagulation Profile: Recent Labs  Lab 04/15/17 0755  INR 1.19   Cardiac Enzymes: Recent Labs  Lab 04/19/17 0430  TROPONINI <0.03   BNP (last 3 results) No results for input(s): PROBNP in the last 8760 hours. HbA1C: No results for input(s): HGBA1C in the last 72 hours. CBG: Recent Labs  Lab 04/20/17 1959 04/21/17 0010 04/21/17 0411 04/21/17 0758 04/21/17 1215  GLUCAP 103* 81 85 77 73   Lipid Profile: No results for input(s): CHOL, HDL, LDLCALC, TRIG, CHOLHDL, LDLDIRECT in the last 72 hours. Thyroid Function Tests: No results for input(s): TSH, T4TOTAL, FREET4, T3FREE, THYROIDAB in the last 72 hours. Anemia Panel: No results for input(s): VITAMINB12, FOLATE, FERRITIN, TIBC, IRON, RETICCTPCT in the last 72 hours. Urine analysis:    Component Value Date/Time   COLORURINE YELLOW 04/02/2017 1705   APPEARANCEUR CLOUDY (A) 04/02/2017 1705   LABSPEC 1.008 04/02/2017 1705   PHURINE 5.0 04/02/2017 1705   GLUCOSEU NEGATIVE 04/02/2017 1705   HGBUR LARGE (A) 04/02/2017 1705   BILIRUBINUR NEGATIVE 04/02/2017 1705   KETONESUR NEGATIVE 04/02/2017 1705   PROTEINUR NEGATIVE 04/02/2017 1705   NITRITE NEGATIVE 04/02/2017 1705   LEUKOCYTESUR LARGE (A) 04/02/2017 1705   Sepsis Labs: @LABRCNTIP (procalcitonin:4,lacticidven:4)  )No results  found for this or any previous visit (from the past 240 hour(s)).    Studies: No results found.  Scheduled Meds: . arformoterol  15 mcg Nebulization BID  . budesonide (PULMICORT) nebulizer solution  0.5 mg Nebulization BID  . Chlorhexidine Gluconate Cloth  6 each Topical Daily  . feeding supplement  1 Container Oral TID BM  . Gerhardt's butt cream   Topical TID  . insulin aspart  2-6 Units Subcutaneous Q4H  . LORazepam  0.5 mg Oral Once  . pantoprazole  40 mg Oral BID  . sodium chloride flush  3 mL Intravenous Q12H    Continuous Infusions: . sodium chloride    . levETIRAcetam Stopped (04/21/17 0541)     LOS: 19 days     Alma Friendly, MD [facility]   If 7PM-7AM, please contact night-coverage [email or web address] Password [facility] 04/21/2017, 3:41 PM

## 2017-04-21 NOTE — Progress Notes (Signed)
CCMD notified RN of pt going into afib with HR between 130s-160s. Pt moving in bed and resting. Pt has history of afib and [place], MD made aware. Will continue to monitor pt.

## 2017-04-21 NOTE — Progress Notes (Signed)
Inpatient Rehabilitation  Continuing to follow along for timing of medical readiness, therapy tolerance, insurance authorization, and IP Rehab bed availability.  Note hypotension; however, good progress with therapy.  Met with patient and brother at bedside to answer insurance questions.  Will initiate insurance authorization for IP Rehab.  Call if questions.   Carmelia Roller., CCC/SLP Admission Coordinator  [place]  Cell [phone]

## 2017-04-22 ENCOUNTER — Other Ambulatory Visit: Payer: Self-pay

## 2017-04-22 ENCOUNTER — Inpatient Hospital Stay (HOSPITAL_COMMUNITY)
Admission: RE | Admit: 2017-04-22 | Discharge: 2017-04-30 | DRG: 071 | Disposition: A | Discharge status: Home Health | Payer: BLUE CROSS/BLUE SHIELD | Source: Intra-hospital | Attending: Physical Medicine & Rehabilitation | Admitting: Physical Medicine & Rehabilitation

## 2017-04-22 ENCOUNTER — Encounter (HOSPITAL_COMMUNITY): Payer: Self-pay | Admitting: *Deleted

## 2017-04-22 DIAGNOSIS — I10 Essential (primary) hypertension: Secondary | ICD-10-CM | POA: Diagnosis present

## 2017-04-22 DIAGNOSIS — Z8249 Family history of ischemic heart disease and other diseases of the circulatory system: Secondary | ICD-10-CM | POA: Diagnosis not present

## 2017-04-22 DIAGNOSIS — R739 Hyperglycemia, unspecified: Secondary | ICD-10-CM | POA: Diagnosis not present

## 2017-04-22 DIAGNOSIS — L89159 Pressure ulcer of sacral region, unspecified stage: Secondary | ICD-10-CM | POA: Diagnosis present

## 2017-04-22 DIAGNOSIS — E46 Unspecified protein-calorie malnutrition: Secondary | ICD-10-CM | POA: Diagnosis not present

## 2017-04-22 DIAGNOSIS — R197 Diarrhea, unspecified: Secondary | ICD-10-CM | POA: Diagnosis not present

## 2017-04-22 DIAGNOSIS — I481 Persistent atrial fibrillation: Secondary | ICD-10-CM | POA: Diagnosis present

## 2017-04-22 DIAGNOSIS — R222 Localized swelling, mass and lump, trunk: Secondary | ICD-10-CM | POA: Diagnosis present

## 2017-04-22 DIAGNOSIS — I82401 Acute embolism and thrombosis of unspecified deep veins of right lower extremity: Secondary | ICD-10-CM | POA: Diagnosis present

## 2017-04-22 DIAGNOSIS — E8809 Other disorders of plasma-protein metabolism, not elsewhere classified: Secondary | ICD-10-CM

## 2017-04-22 DIAGNOSIS — R0902 Hypoxemia: Secondary | ICD-10-CM | POA: Diagnosis present

## 2017-04-22 DIAGNOSIS — Z7982 Long term (current) use of aspirin: Secondary | ICD-10-CM

## 2017-04-22 DIAGNOSIS — L89150 Pressure ulcer of sacral region, unstageable: Secondary | ICD-10-CM

## 2017-04-22 DIAGNOSIS — J9859 Other diseases of mediastinum, not elsewhere classified: Secondary | ICD-10-CM

## 2017-04-22 DIAGNOSIS — I6783 Posterior reversible encephalopathy syndrome: Principal | ICD-10-CM | POA: Diagnosis present

## 2017-04-22 DIAGNOSIS — Z9981 Dependence on supplemental oxygen: Secondary | ICD-10-CM

## 2017-04-22 DIAGNOSIS — E876 Hypokalemia: Secondary | ICD-10-CM | POA: Diagnosis present

## 2017-04-22 DIAGNOSIS — G7281 Critical illness myopathy: Secondary | ICD-10-CM | POA: Diagnosis present

## 2017-04-22 DIAGNOSIS — R1030 Lower abdominal pain, unspecified: Secondary | ICD-10-CM | POA: Diagnosis not present

## 2017-04-22 DIAGNOSIS — F17210 Nicotine dependence, cigarettes, uncomplicated: Secondary | ICD-10-CM | POA: Diagnosis present

## 2017-04-22 DIAGNOSIS — R5381 Other malaise: Secondary | ICD-10-CM

## 2017-04-22 DIAGNOSIS — K909 Intestinal malabsorption, unspecified: Secondary | ICD-10-CM | POA: Diagnosis not present

## 2017-04-22 DIAGNOSIS — Z95828 Presence of other vascular implants and grafts: Secondary | ICD-10-CM

## 2017-04-22 DIAGNOSIS — I4891 Unspecified atrial fibrillation: Secondary | ICD-10-CM

## 2017-04-22 DIAGNOSIS — D62 Acute posthemorrhagic anemia: Secondary | ICD-10-CM | POA: Diagnosis not present

## 2017-04-22 DIAGNOSIS — E7800 Pure hypercholesterolemia, unspecified: Secondary | ICD-10-CM | POA: Diagnosis present

## 2017-04-22 DIAGNOSIS — K920 Hematemesis: Secondary | ICD-10-CM

## 2017-04-22 DIAGNOSIS — K253 Acute gastric ulcer without hemorrhage or perforation: Secondary | ICD-10-CM

## 2017-04-22 DIAGNOSIS — Z79899 Other long term (current) drug therapy: Secondary | ICD-10-CM | POA: Diagnosis not present

## 2017-04-22 LAB — CBC WITH DIFFERENTIAL/PLATELET
Basophils Absolute: 0 10*3/uL (ref 0.0–0.1)
Basophils Relative: 1 %
Eosinophils Absolute: 0.2 10*3/uL (ref 0.0–0.7)
Eosinophils Relative: 5 %
HCT: 27.9 % — ABNORMAL LOW (ref 36.0–46.0)
Hemoglobin: 8.7 g/dL — ABNORMAL LOW (ref 12.0–15.0)
Lymphocytes Relative: 29 %
Lymphs Abs: 1.3 10*3/uL (ref 0.7–4.0)
MCH: 30.7 pg (ref 26.0–34.0)
MCHC: 31.2 g/dL (ref 30.0–36.0)
MCV: 98.6 fL (ref 78.0–100.0)
Monocytes Absolute: 0.5 10*3/uL (ref 0.1–1.0)
Monocytes Relative: 12 %
Neutro Abs: 2.3 10*3/uL (ref 1.7–7.7)
Neutrophils Relative %: 53 %
Platelets: 158 10*3/uL (ref 150–400)
RBC: 2.83 MIL/uL — ABNORMAL LOW (ref 3.87–5.11)
RDW: 17 % — ABNORMAL HIGH (ref 11.5–15.5)
WBC: 4.3 10*3/uL (ref 4.0–10.5)

## 2017-04-22 LAB — BASIC METABOLIC PANEL
Anion gap: 9 (ref 5–15)
BUN: 6 mg/dL (ref 6–20)
CO2: 22 mmol/L (ref 22–32)
Calcium: 7.8 mg/dL — ABNORMAL LOW (ref 8.9–10.3)
Chloride: 110 mmol/L (ref 101–111)
Creatinine, Ser: 0.9 mg/dL (ref 0.44–1.00)
GFR calc Af Amer: >60 mL/min (ref >60)
GFR calc non Af Amer: >60 mL/min (ref >60)
Glucose, Bld: 94 mg/dL (ref 65–99)
Potassium: 3.2 mmol/L — ABNORMAL LOW (ref 3.5–5.1)
Sodium: 141 mmol/L (ref 135–145)

## 2017-04-22 LAB — GLUCOSE, CAPILLARY
Glucose-Capillary: 82 mg/dL (ref 65–99)
Glucose-Capillary: 91 mg/dL (ref 65–99)
Glucose-Capillary: 75 mg/dL (ref 65–99)
Glucose-Capillary: 154 mg/dL — ABNORMAL HIGH (ref 65–99)

## 2017-04-22 MED ORDER — GUAIFENESIN-DM 100-10 MG/5ML PO SYRP: 5.0000 mL | ORAL_SOLUTION | Freq: Four times a day (QID) | ORAL | Status: DC | PRN

## 2017-04-22 MED ORDER — MENTHOL 3 MG MT LOZG: 1.0000 | LOZENGE | OROMUCOSAL | Status: DC | PRN

## 2017-04-22 MED ORDER — BOOST / RESOURCE BREEZE PO LIQD CUSTOM
1.0000 | Freq: Three times a day (TID) | ORAL | Status: DC
  Administered 2017-04-22 – 2017-04-28 (×12): 1 via ORAL

## 2017-04-22 MED ORDER — ARFORMOTEROL TARTRATE 15 MCG/2ML IN NEBU
15.0000 ug | INHALATION_SOLUTION | Freq: Two times a day (BID) | RESPIRATORY_TRACT | Status: DC
Start: 2017-04-22 — End: 2017-04-30
  Administered 2017-04-22 – 2017-04-30 (×13): 15 ug via RESPIRATORY_TRACT
  Filled 2017-04-22 (×16): qty 2

## 2017-04-22 MED ORDER — ACETAMINOPHEN 325 MG PO TABS
325.0000 mg | ORAL_TABLET | ORAL | Status: DC | PRN
  Administered 2017-04-24 – 2017-04-26 (×3): 650 mg via ORAL
  Filled 2017-04-22 (×3): qty 2

## 2017-04-22 MED ORDER — GI COCKTAIL ~~LOC~~
30.0000 mL | Freq: Three times a day (TID) | ORAL | Status: DC | PRN
  Administered 2017-04-25: 30 mL via ORAL
  Filled 2017-04-22 (×3): qty 30

## 2017-04-22 MED ORDER — WHITE PETROLATUM EX OINT
TOPICAL_OINTMENT | CUTANEOUS | Status: DC | PRN
  Filled 2017-04-22: qty 28.35

## 2017-04-22 MED ORDER — PANTOPRAZOLE SODIUM 40 MG PO TBEC
40.0000 mg | DELAYED_RELEASE_TABLET | Freq: Two times a day (BID) | ORAL | Status: DC
  Administered 2017-04-22 – 2017-04-30 (×15): 40 mg via ORAL
  Filled 2017-04-22 (×15): qty 1

## 2017-04-22 MED ORDER — BUDESONIDE 0.5 MG/2ML IN SUSP
0.5000 mg | Freq: Two times a day (BID) | RESPIRATORY_TRACT | Status: DC
  Administered 2017-04-22 – 2017-04-30 (×13): 0.5 mg via RESPIRATORY_TRACT
  Filled 2017-04-22 (×18): qty 2

## 2017-04-22 MED ORDER — LEVETIRACETAM 500 MG PO TABS: 500.0000 mg | ORAL_TABLET | Freq: Two times a day (BID) | ORAL | Status: DC

## 2017-04-22 MED ORDER — CALCIUM CARBONATE ANTACID 500 MG PO CHEW
1.0000 | CHEWABLE_TABLET | Freq: Three times a day (TID) | ORAL | Status: DC
  Administered 2017-04-22 – 2017-04-30 (×23): 200 mg via ORAL
  Filled 2017-04-22 (×23): qty 1

## 2017-04-22 MED ORDER — POLYETHYLENE GLYCOL 3350 17 G PO PACK: 17.0000 g | PACK | Freq: Every day | ORAL | Status: DC | PRN

## 2017-04-22 MED ORDER — LEVETIRACETAM 500 MG PO TABS
500.0000 mg | ORAL_TABLET | Freq: Two times a day (BID) | ORAL | Status: DC
  Administered 2017-04-22 – 2017-04-30 (×16): 500 mg via ORAL
  Filled 2017-04-22 (×16): qty 1

## 2017-04-22 MED ORDER — PROCHLORPERAZINE EDISYLATE 5 MG/ML IJ SOLN: 5.0000 mg | Freq: Four times a day (QID) | INTRAMUSCULAR | Status: DC | PRN

## 2017-04-22 MED ORDER — SODIUM CHLORIDE 0.9% FLUSH: 10.0000 mL | INTRAVENOUS | Status: DC | PRN

## 2017-04-22 MED ORDER — POTASSIUM CHLORIDE CRYS ER 10 MEQ PO TBCR
10.0000 meq | EXTENDED_RELEASE_TABLET | Freq: Three times a day (TID) | ORAL | Status: DC
  Administered 2017-04-22 – 2017-04-27 (×16): 10 meq via ORAL
  Filled 2017-04-22 (×16): qty 1

## 2017-04-22 MED ORDER — ONDANSETRON HCL 4 MG/2ML IJ SOLN: 4.0000 mg | Freq: Four times a day (QID) | INTRAMUSCULAR | Status: DC | PRN

## 2017-04-22 MED ORDER — PANTOPRAZOLE SODIUM 40 MG PO TBEC: 40.0000 mg | DELAYED_RELEASE_TABLET | Freq: Two times a day (BID) | ORAL | 0 refills | Status: DC

## 2017-04-22 MED ORDER — ALUM & MAG HYDROXIDE-SIMETH 200-200-20 MG/5ML PO SUSP: 30.0000 mL | ORAL | Status: DC | PRN

## 2017-04-22 MED ORDER — PROCHLORPERAZINE MALEATE 5 MG PO TABS
5.0000 mg | ORAL_TABLET | Freq: Four times a day (QID) | ORAL | Status: DC | PRN
  Administered 2017-04-24: 5 mg via ORAL
  Filled 2017-04-22: qty 1

## 2017-04-22 MED ORDER — PRO-STAT SUGAR FREE PO LIQD
30.0000 mL | Freq: Two times a day (BID) | ORAL | Status: DC
  Administered 2017-04-22 – 2017-04-30 (×16): 30 mL via ORAL
  Filled 2017-04-22 (×16): qty 30

## 2017-04-22 MED ORDER — NICOTINE 14 MG/24HR TD PT24: 14.0000 mg | MEDICATED_PATCH | Freq: Every day | TRANSDERMAL | Status: DC | PRN

## 2017-04-22 MED ORDER — TRAZODONE HCL 50 MG PO TABS
25.0000 mg | ORAL_TABLET | Freq: Every evening | ORAL | Status: DC | PRN
  Administered 2017-04-24: 50 mg via ORAL
  Filled 2017-04-22: qty 1

## 2017-04-22 MED ORDER — SODIUM CHLORIDE 0.9% FLUSH
10.0000 mL | Freq: Two times a day (BID) | INTRAVENOUS | Status: DC
  Administered 2017-04-23: 10 mL

## 2017-04-22 MED ORDER — BISACODYL 10 MG RE SUPP: 10.0000 mg | Freq: Every day | RECTAL | Status: DC | PRN

## 2017-04-22 MED ORDER — COLLAGENASE 250 UNIT/GM EX OINT
TOPICAL_OINTMENT | Freq: Every day | CUTANEOUS | Status: DC
  Administered 2017-04-22 – 2017-04-30 (×8): via TOPICAL
  Filled 2017-04-22 (×2): qty 30

## 2017-04-22 MED ORDER — PROCHLORPERAZINE 25 MG RE SUPP: 12.5000 mg | Freq: Four times a day (QID) | RECTAL | Status: DC | PRN

## 2017-04-22 MED ORDER — LIDOCAINE 5 % EX PTCH
1.0000 | MEDICATED_PATCH | CUTANEOUS | Status: DC
  Administered 2017-04-23 – 2017-04-30 (×8): 1 via TRANSDERMAL
  Filled 2017-04-22 (×8): qty 1

## 2017-04-22 MED ORDER — ONDANSETRON HCL 4 MG PO TABS: 4.0000 mg | ORAL_TABLET | Freq: Four times a day (QID) | ORAL | Status: DC | PRN

## 2017-04-22 MED ORDER — DIPHENHYDRAMINE HCL 12.5 MG/5ML PO ELIX
12.5000 mg | ORAL_SOLUTION | Freq: Four times a day (QID) | ORAL | Status: DC | PRN
  Administered 2017-04-25: 25 mg via ORAL
  Filled 2017-04-22: qty 10

## 2017-04-22 MED ORDER — FLEET ENEMA 7-19 GM/118ML RE ENEM: 1.0000 | ENEMA | Freq: Once | RECTAL | Status: DC | PRN

## 2017-04-22 MED ORDER — POTASSIUM CHLORIDE CRYS ER 20 MEQ PO TBCR: 20.0000 meq | EXTENDED_RELEASE_TABLET | Freq: Two times a day (BID) | ORAL | Status: DC

## 2017-04-22 MED ORDER — LEVETIRACETAM 500 MG PO TABS: 500.0000 mg | ORAL_TABLET | Freq: Two times a day (BID) | ORAL | 0 refills | Status: DC

## 2017-04-22 NOTE — Progress Notes (Signed)
OT Cancellation Note  Patient Details Name: Kayla Wyatt MRN: [id number] DOB: Jun 21, 1955   Cancelled Treatment:    Reason Eval/Treat Not Completed: Fatigue/lethargy limiting ability to participate. Pt politely declining, reports having MRI late last night and is sleepy.  Malka So 04/22/2017, 10:15 AM  04/22/2017 Nestor Lewandowsky, OTR/L Pager: [phone]

## 2017-04-22 NOTE — H&P (Addendum)
Physical Medicine and Rehabilitation Admission H&P    Chief Complaint  Patient presents with  . PRES    HPI: Kayla Wyatt is a 62 y.o. female with history of HTN who was admitted on 04/02/17 with abdominal pain, hematemesis, melena and ABLA due to GIB. History taken from chart review. She underwent clipping of gastric ulcer and received multiple units PRBC but continued to have drop in H/H with bloody stool. NM GI scan was negative for bleeding and she underwent colonoscopy by Dr. [name] 1/31 showing diverticulosis but no signs of bleeding. Hospital course significant for R- gastrocnemius DVT, PAF, incidental findings of mediastinal mass as well as brief episode of hematuria. She was cleared to start on Xarelto on 02/01 for treatment of DVT.  On  2/2 am she developed hematemesis with maroon stools and hypotension requiring fluid bolus as well as 2 units PRBCs. She continued to decline requiring intubation, pressors as well as reversal of anticoagulation with Kcentra on 02/3. She underwent UGI revealing large clot with fresh blood in stomach and underwent mesenteric arteriogram with percutaneous coil embolization fo distal tributary of left gastric artery and placement of IVC filter by interventional radiology.   A fib with RVR felt to be due to hemorrhagic shock and she converted to NSR. [name] felt no further cardiac work up indicated and did not recommend initiating anticoagulation in short term. To follow up with [name] after PET scan and routine biopsy after medically stable.  She continued to have bleeding and underwent visceral angiogram with embolization of inferior division of splenic artery and associated gastric arteries on 2/4. No surgical intervention needed per [name] with recommendations to continue monitoring H/H as well as for recurrence of hematochezia. She tolerated extubation on 2/6 and respiratory status improving.   On 2/11, she has episode of unresponsiveness  with jerking of bilateral limbs that lasted about 3 minutes. She had amnesia of events but was back to baseline.  EEG revealed "focal slowing over the right temporo-occipital region and occasional epileptiform discharges over the right occipital region". Head CT reviewed, unremarkable for acute intracranial process. MRI brain done revealing mild biparietal signal abnormality suspicious for PRES.  [name] recommended starting patient on Keppra BID with repeat MRI in 2 weeks. New onset seizure likely provoked by PRES--if resolved--Ok to take patient off Keppra.  Patient with resultant generalized weakness. CIR recommended due to functional deficits.    Review of Systems  Constitutional: Negative for chills and fever.  HENT: Negative for hearing loss and tinnitus.   Eyes: Negative for blurred vision and double vision.  Respiratory: Positive for shortness of breath. Negative for sputum production.   Cardiovascular: Negative for chest pain and palpitations.  Gastrointestinal: Positive for abdominal pain. Negative for nausea and vomiting.       Poor appetite  Genitourinary: Negative for dysuria and urgency.  Musculoskeletal: Positive for back pain (chronic) and myalgias.  Skin: Negative for itching and rash.  Neurological: Positive for sensory change (numbness bilateral feet), focal weakness and weakness.  Psychiatric/Behavioral: Negative for depression. The patient is nervous/anxious. The patient does not have insomnia.   All other systems reviewed and are negative.     Past Medical History:  Diagnosis Date  . Acute blood loss anemia 04/02/2017  . Gastrointestinal hemorrhage with melena   . Hypercholesteremia   . Hypertension   . Persistent atrial fibrillation with rapid ventricular response ([place]) 04/07/2017  . Vitamin D deficiency     Past Surgical  History:  Procedure Laterality Date  . COLONOSCOPY Left 04/09/2017   Procedure: COLONOSCOPY;  Surgeon: Carol Ada, MD;  Location: [facility];  Service: Endoscopy;  Laterality: Left;  . ESOPHAGOGASTRODUODENOSCOPY N/A 04/03/2017   Procedure: ESOPHAGOGASTRODUODENOSCOPY (EGD);  Surgeon: Carol Ada, MD;  Location: [facility];  Service: Endoscopy;  Laterality: N/A;  . ESOPHAGOGASTRODUODENOSCOPY (EGD) WITH PROPOFOL N/A 04/12/2017   Procedure: ESOPHAGOGASTRODUODENOSCOPY (EGD) WITH PROPOFOL;  Surgeon: Milus Banister, MD;  Location: [facility] ENDOSCOPY;  Service: Endoscopy;  Laterality: N/A;  . IR ANGIOGRAM SELECTIVE EACH ADDITIONAL VESSEL  04/12/2017  . IR ANGIOGRAM SELECTIVE EACH ADDITIONAL VESSEL  04/13/2017  . IR ANGIOGRAM SELECTIVE EACH ADDITIONAL VESSEL  04/13/2017  . IR ANGIOGRAM SELECTIVE EACH ADDITIONAL VESSEL  04/13/2017  . IR ANGIOGRAM VISCERAL SELECTIVE  04/12/2017  . IR ANGIOGRAM VISCERAL SELECTIVE  04/13/2017  . IR EMBO ART  VEN HEMORR LYMPH EXTRAV  INC GUIDE ROADMAPPING  04/12/2017  . IR EMBO ART  VEN HEMORR LYMPH EXTRAV  INC GUIDE ROADMAPPING  04/13/2017  . IR FLUORO GUIDE CV LINE RIGHT  04/13/2017  . IR IVC FILTER PLMT / S&I /IMG GUID/MOD SED  04/12/2017  . IR US GUIDE VASC ACCESS RIGHT  04/12/2017  . IR US GUIDE VASC ACCESS RIGHT  04/13/2017  . IR US GUIDE VASC ACCESS RIGHT  04/13/2017  . LEG SURGERY  1974   Blood Clot Removal     Family History  Problem Relation Age of Onset  . Heart disease Father     Social History:  Lives with brother. 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 She reports that she has been smoking 1/2 PPD for 40 years.  She has never used smokeless tobacco. She reports that she does not drink alcohol or use drugs.    Allergies: No Known Allergies    Medications Prior to Admission  Medication Sig Dispense Refill  . aspirin 81 MG tablet Take 81 mg by mouth daily.    . cholecalciferol (VITAMIN D) 1000 units tablet Take 1,000 Units by mouth 2 (two) times daily.    [name] lisinopril-hydrochlorothiazide (PRINZIDE,ZESTORETIC) 20-12.5 MG tablet Take 1 tablet by mouth  daily.    . simvastatin (ZOCOR) 20 MG tablet Take 1 tablet by mouth daily.  1    Drug Regimen Review  Drug regimen was reviewed and remains appropriate with no significant issues identified  Home: Home Living Family/patient expects to be discharged to:: Private residence Living Arrangements: Other relatives Available Help at Discharge: Family, Available PRN/intermittently Type of Home: House Home Access: Level entry Home Layout: One level(except 1 step into some areas) Home Equipment: None   Functional History: Prior Function Level of Independence: Independent Comments: works  Functional Status:  Mobility: Bed Mobility Overal bed mobility: Needs Assistance Bed Mobility: Supine to Sit Rolling: Min assist Sidelying to sit: Min assist Supine to sit: Min assist General bed mobility comments: minA for trunk to upright, use of bedrails to pull to EoB Transfers Overall transfer level: Needs assistance Equipment used: Rolling walker (2 wheeled) Transfers: Sit to/from Stand, [employer] Transfers Sit to Stand: Min assist Stand pivot transfers: Min assist, +2 physical assistance General transfer comment: minA for power up to standing, good steadying Ambulation/Gait Ambulation/Gait assistance: Min assist Ambulation Distance (Feet): 12 Feet Assistive device: Rolling walker (2 wheeled) Gait Pattern/deviations: Step-through pattern, Decreased step length - right, Decreased step length - left, Trunk flexed General Gait Details: minA for steadying, pt walked 6 feet  required seated rest break and then was able to return to bed  Gait velocity: slowed Gait velocity interpretation: Below normal speed for age/gender    ADL: ADL Overall ADL's : Needs assistance/impaired Eating/Feeding: Set up, Sitting Grooming: Min guard, Sitting, Minimal assistance Upper Body Bathing: Maximal assistance, Sitting Lower Body Bathing: Maximal assistance, +2 for safety/equipment Upper Body Dressing :  Maximal assistance, Sitting Lower Body Dressing: Maximal assistance, +2 for safety/equipment, Sit to/from stand Toilet Transfer: Moderate assistance, +2 for safety/equipment, Stand-pivot Toileting- Clothing Manipulation and Hygiene: Maximal assistance, Sit to/from stand, +2 for safety/equipment Tub/ Shower Transfer: Maximal assistance, +2 for safety/equipment, Stand-pivot, Rolling walker General ADL Comments: Pt received in bed with nursing students assisting with bathing. Pt then completed bed mobility, sat EOB aobut 2 minutes then took pivotol steps from EOB>recliner. Pt very unsteady on feet and fearful of falling.   Cognition: Cognition Overall Cognitive Status: Within Functional Limits for tasks assessed Orientation Level: Oriented X4 Cognition Arousal/Alertness: Awake/alert Behavior During Therapy: Anxious Overall Cognitive Status: Within Functional Limits for tasks assessed General Comments: anxious about movement and requiring increased motivation for participation   Blood pressure 134/78, pulse 80, temperature 97.8 F (36.6 C), temperature source Oral, resp. rate 16, height '5\' 3"'$  (1.6 m), weight 62 kg (136 lb 11 oz), SpO2 97 %. Physical Exam  Nursing note and vitals reviewed. Constitutional: She is oriented to person, place, and time. She appears well-developed. She has a sickly appearance. No distress. Nasal cannula in place.  Anxious appearing.   HENT:  Head: Normocephalic and atraumatic.  Mouth/Throat: Oropharynx is clear and moist.  Eyes: Conjunctivae and EOM are normal. Pupils are equal, round, and reactive to light. Right eye exhibits no discharge. Left eye exhibits no discharge.  Neck: Normal range of motion. Neck supple.  Cardiovascular: Irregularly irregular. Respiratory: No stridor. Tachypnea noted. No respiratory distress. She has wheezes.  Pursed lip breathing with minimal effort.  + [place]  GI: Soft. Bowel sounds are normal. She exhibits no distension.    Musculoskeletal: She exhibits no edema or tenderness.  Neurological: She is alert and oriented to person, place, and time.  Occasional dysphonia noted.  She is able to follow basic 1 and 2 step command without difficulty.  Motor: 4 -/5 grossly throughout  Skin: Skin is warm and dry. She is not diaphoretic.  Stage II decub on sacrum with yellow eschar and surrounding erythema.   Psychiatric: Her speech is normal and behavior is normal. Her mood appears anxious.    Results for orders placed or performed during the hospital encounter of 04/02/17 (from the past 48 hour(s))  Glucose, capillary     Status: None   Collection Time: 04/20/17 12:03 PM  Result Value Ref Range   Glucose-Capillary 81 65 - 99 mg/dL  Glucose, capillary     Status: None   Collection Time: 04/20/17  4:48 PM  Result Value Ref Range   Glucose-Capillary 71 65 - 99 mg/dL  Glucose, capillary     Status: Abnormal   Collection Time: 04/20/17  7:59 PM  Result Value Ref Range   Glucose-Capillary 103 (H) 65 - 99 mg/dL  Glucose, capillary     Status: None   Collection Time: 04/21/17 12:10 AM  Result Value Ref Range   Glucose-Capillary 81 65 - 99 mg/dL  Glucose, capillary     Status: None   Collection Time: 04/21/17  4:11 AM  Result Value Ref Range   Glucose-Capillary 85 65 - 99 mg/dL  CBC with Differential/Platelet  Status: Abnormal   Collection Time: 04/21/17  4:40 AM  Result Value Ref Range   WBC 4.8 4.0 - 10.5 K/uL   RBC 2.80 (L) 3.87 - 5.11 MIL/uL   Hemoglobin 8.5 (L) 12.0 - 15.0 g/dL   HCT 27.6 (L) 36.0 - 46.0 %   MCV 98.6 78.0 - 100.0 fL   MCH 30.4 26.0 - 34.0 pg   MCHC 30.8 30.0 - 36.0 g/dL   RDW 17.5 (H) 11.5 - 15.5 %   Platelets 124 (L) 150 - 400 K/uL   Neutrophils Relative % 57 %   Neutro Abs 2.8 1.7 - 7.7 K/uL   Lymphocytes Relative 30 %   Lymphs Abs 1.5 0.7 - 4.0 K/uL   Monocytes Relative 8 %   Monocytes Absolute 0.4 0.1 - 1.0 K/uL   Eosinophils Relative 5 %   Eosinophils Absolute 0.2 0.0 - 0.7  K/uL   Basophils Relative 0 %   Basophils Absolute 0.0 0.0 - 0.1 K/uL    Comment: Performed at [facility], [address], [place], [place] [address]  Basic metabolic panel     Status: Abnormal   Collection Time: 04/21/17  4:40 AM  Result Value Ref Range   Sodium 141 135 - 145 mmol/L   Potassium 3.6 3.5 - 5.1 mmol/L   Chloride 109 101 - 111 mmol/L   CO2 24 22 - 32 mmol/L   Glucose, Bld 85 65 - 99 mg/dL   BUN 8 6 - 20 mg/dL   Creatinine, Ser 0.94 0.44 - 1.00 mg/dL   Calcium 7.8 (L) 8.9 - 10.3 mg/dL   GFR calc non Af Amer >60 >60 mL/min   GFR calc Af Amer >60 >60 mL/min    Comment: (NOTE) The eGFR has been calculated using the CKD EPI equation. This calculation has not been validated in all clinical situations. eGFR's persistently <60 mL/min signify possible Chronic Kidney Disease.    Anion gap 8 5 - 15    Comment: Performed at [facility] [address] [place], [place] [address]  Glucose, capillary     Status: None   Collection Time: 04/21/17  7:58 AM  Result Value Ref Range   Glucose-Capillary 77 65 - 99 mg/dL  Glucose, capillary     Status: None   Collection Time: 04/21/17 12:15 PM  Result Value Ref Range   Glucose-Capillary 73 65 - 99 mg/dL  Glucose, capillary     Status: Abnormal   Collection Time: 04/21/17  4:04 PM  Result Value Ref Range   Glucose-Capillary 104 (H) 65 - 99 mg/dL  Glucose, capillary     Status: Abnormal   Collection Time: 04/21/17  8:56 PM  Result Value Ref Range   Glucose-Capillary 105 (H) 65 - 99 mg/dL  Glucose, capillary     Status: None   Collection Time: 04/22/17 12:56 AM  Result Value Ref Range   Glucose-Capillary 82 65 - 99 mg/dL  CBC with Differential/Platelet     Status: Abnormal   Collection Time: 04/22/17  4:38 AM  Result Value Ref Range   WBC 4.3 4.0 - 10.5 K/uL   RBC 2.83 (L) 3.87 - 5.11 MIL/uL   Hemoglobin 8.7 (L) 12.0 - 15.0 g/dL   HCT 27.9 (L) 36.0 - 46.0 %   MCV 98.6 78.0 - 100.0 fL   MCH 30.7 26.0 - 34.0 pg     MCHC 31.2 30.0 - 36.0 g/dL   RDW 17.0 (H) 11.5 - 15.5 %  Platelets 158 150 - 400 K/uL   Neutrophils Relative % 53 %   Neutro Abs 2.3 1.7 - 7.7 K/uL   Lymphocytes Relative 29 %   Lymphs Abs 1.3 0.7 - 4.0 K/uL   Monocytes Relative 12 %   Monocytes Absolute 0.5 0.1 - 1.0 K/uL   Eosinophils Relative 5 %   Eosinophils Absolute 0.2 0.0 - 0.7 K/uL   Basophils Relative 1 %   Basophils Absolute 0.0 0.0 - 0.1 K/uL    Comment: Performed at [facility] [address], [place], [place] [address]  Basic metabolic panel     Status: Abnormal   Collection Time: 04/22/17  4:38 AM  Result Value Ref Range   Sodium 141 135 - 145 mmol/L   Potassium 3.2 (L) 3.5 - 5.1 mmol/L   Chloride 110 101 - 111 mmol/L   CO2 22 22 - 32 mmol/L   Glucose, Bld 94 65 - 99 mg/dL   BUN 6 6 - 20 mg/dL   Creatinine, Ser 0.90 0.44 - 1.00 mg/dL   Calcium 7.8 (L) 8.9 - 10.3 mg/dL   GFR calc non Af Amer >60 >60 mL/min   GFR calc Af Amer >60 >60 mL/min    Comment: (NOTE) The eGFR has been calculated using the CKD EPI equation. This calculation has not been validated in all clinical situations. eGFR's persistently <60 mL/min signify possible Chronic Kidney Disease.    Anion gap 9 5 - 15    Comment: Performed at [facility] [address], [place], [place] [address]  Glucose, capillary     Status: None   Collection Time: 04/22/17  4:45 AM  Result Value Ref Range   Glucose-Capillary 91 65 - 99 mg/dL  Glucose, capillary     Status: None   Collection Time: 04/22/17  8:29 AM  Result Value Ref Range   Glucose-Capillary 75 65 - 99 mg/dL   Mr Jeri Cos AO Contrast  Result Date: 04/22/2017 CLINICAL DATA:  Altered mental status, minimally responsive. Seizure like symptoms. EXAM: MRI HEAD WITHOUT AND WITH CONTRAST TECHNIQUE: Multiplanar, multiecho pulse sequences of the brain and surrounding structures were obtained without and with intravenous contrast. CONTRAST:  26m MULTIHANCE GADOBENATE DIMEGLUMINE 529 MG/ML  IV SOLN COMPARISON:  CT HEAD April 19, 2017 FINDINGS: Mildly motion degraded examination. INTRACRANIAL CONTENTS: No reduced diffusion to suggest acute ischemia or status epilepticus. No susceptibility artifact to suggest hemorrhage. The ventricles and sulci are normal for patient's age. Small area faint bilateral parietal FLAIR T2 hyperintense signal. No midline shift, mass effect or masses. No abnormal extra-axial fluid collections. No extra-axial masses. No abnormal intraparenchymal or extra-axial enhancement. VASCULAR: Normal major intracranial vascular flow voids present at skull base. SKULL AND UPPER CERVICAL SPINE: No abnormal sellar expansion. No suspicious calvarial bone marrow signal. Craniocervical junction maintained. SINUSES/ORBITS: Bilateral mastoid effusions.The included ocular globes and orbital contents are non-suspicious. OTHER: Patient is edentulous. IMPRESSION: 1. Motion degraded examination. 2. 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 3. Otherwise negative MRI of the head with and without contrast for age. Electronically Signed   By: CElon AlasM.D.   On: 04/22/2017 00:34     Medical Problem List and Plan: 1.  Deficits with mobility, endurance, self-care secondary to PRES 2.  RLE DVT : Treated with IVC filter. No anticoagulation-- due to significant bleed on Xarelto.  3. Pain Management: tylenol prn 4. Mood: team to provide ego support. LCSW to follow for evaluation and support.  5. Neuropsych: This patient is not fully capable of making decisions on her own behalf. 6. Skin/Wound Care: Air mattress overlay for sacral decub with MASD. Will add santyl with damp to dry dressing. Add vitamins as well as protein supplements to help promote wound healing.  7. Fluids/Electrolytes/Nutrition: Monitor I/O. Add supplements with meals.  8. Gastric ulcer/GIB with hemorrhagic shock/ABLA:  Has been treated with 6 units PRBC. Continues  to have abdominal pain but H/H stable  8.5 --continue to monitor for stability.  9. Mediastinal mass: Will need to follow up on outpatient basis  10. AKI:  Has resolved. Continue to encourage adequate intake.  11. PRES with new onset seizures: Continue Keppra bid--follow MRI brain aro 12. Abdominal pain: Resolving. Monitor stools for any evidence of recurrent bleeding.  13. Hypokalemia: Has been persistent. Will add supplement. Magnesium levels improved after IV supplement will recheck in am.  14. Hypoxia: Continue nebulizers--encourage use bid.  Wean supplemental oxgen as able.  15. Hypocalcemia: Will add CaCO3 for oral supplement.     Post Admission Physician Evaluation: 1. Preadmission assessment reviewed and changes made below. 2. Functional deficits secondary  to PRES. 3. Patient is admitted to receive collaborative, interdisciplinary care between the physiatrist, rehab nursing staff, and therapy team. 4. Patient's level of medical complexity and substantial therapy needs in context of that medical necessity cannot be provided at a lesser intensity of care such as a SNF. 5. Patient has experienced substantial functional loss from his/her baseline which was documented above under the "Functional History" and "Functional Status" headings.  Judging by the patient's diagnosis, physical exam, and functional history, the patient has potential for functional progress which will result in measurable gains while on inpatient rehab.  These gains will be of substantial and practical use upon discharge  in facilitating mobility and self-care at the household level. 23. Physiatrist will provide 24 hour management of medical needs as well as oversight of the therapy plan/treatment and provide guidance as appropriate regarding the interaction of the two. 7. 24 hour rehab nursing will assist with safety, disease management and patient education  and help integrate therapy concepts, techniques,education,  etc. 8. PT will assess and treat for/with: Lower extremity strength, range of motion, stamina, balance, functional mobility, safety, adaptive techniques and equipment, woundcare, coping skills, pain control, education.   Goals are: Mod I. 9. OT will assess and treat for/with: ADL's, functional mobility, safety, upper extremity strength, adaptive techniques and equipment, wound mgt, ego support, and community reintegration.   Goals are: Mod I. Therapy may not proceed with showering this patient. 10. SLP will assess and treat for/with: cognition.  Goals are: Mod I/Supervision. 11. Case Management and Social Worker will assess and treat for psychological issues and discharge planning. 12. Team conference will be held weekly to assess progress toward goals and to determine barriers to discharge. 13. Patient will receive at least 3 hours of therapy per day at least 5 days per week. 14. ELOS: 6-11 days.       15. Prognosis:  good  Delice Lesch, MD, ABPMR Bary Leriche, PA-C 04/22/2017

## 2017-04-22 NOTE — Discharge Summary (Signed)
Physician Discharge Summary  Kayla Wyatt OZD:[id number] DOB: [birth year] DOA: 04/02/2017  PCP: [name], MD  Admit date: 04/02/2017 Discharge date: 04/22/2017  Admitted From: Home Disposition:  Inpatient rehab  Recommendations for Outpatient Follow-up:  1. Follow up with PCP in 1-week 2. Patient has been placed on Keppra 500 mg bid for seziures 3. Patient had an IVC filter placed, for DVT. Not candidate for anticoagulation.   Home Health: Na  Equipment/Devices: na  Discharge Condition: Stable CODE STATUS: full  Diet recommendation:  Herat healthy  Brief/Interim Summary: Patient had a prolonged hospital stay. 62 year old female who presented with abdominal pain and diarrhea. She has a significant past medical history of hypertension and dyslipidemia. She had ongoing symptoms for 6 days, associated with melena 24 hours prior to hospitalization. On initial physical examination blood pressure 104/73, heart rate 84, respiratory 29, temperature 97.8, oxygen saturation 97%. Moist mucous membranes, lungs clear to auscultation bilaterally, heart S1-S2 present rhythmic, abdomen soft nontender, no lower extremity edema. Sodium 138, potassium 2.6, chloride 99, bicarbonate 17, glucose 225, BUN 116, creatinine 1.49, white count 6.2, hemoglobin 8.0, hematocrit 22.8, platelets 249. CT of the abdomen with mildly dilated distal small bowel loops suggesting ileus, chest film hyperinflated, no infiltrates. EKG sinus rhythm, 92 bpm, normal axis, normal intervals.  Patient was admitted to the hospital with the working diagnosis acute blood loss anemia due to suspected upper GI bleed.   1. Acute blood loss anemia, due to upper GI bleed. Patient was kept nothing by mouth, he received IV fluids, IV antiacids and IV antiemetics. Patient underwent upper endoscopy showing single vessel in the setting of mild ulceration in the stomach, a clip was placed, the duodenum was normal. Patient required a total of 6 units  of packed red blood cells transfusion. Her hemoglobin has been remained stable at 8.5-8.7. As  part of further workup she underwent colonoscopy showing diverticulosis in the sigmoid colon. Patient has been placed on proton pump inhibitors.   2. Right lower extremity deep vein thrombosis. Patient was found to have a deep vein thrombosis on the right lower extremity, she was placed on anticoagulation with rivaroxaban, developing significant bleeding and shock. IVC filter was placed.   3. Acute hypoxic respiratory failure related to hemorrhagic shock. Patient had profuse bleeding, hemodynamic decompensation, and acute encephalopathy that prompted intubation and mechanical ventilation. Patient received supportive medical care, further PRBC transfusions. Patient underwent repeat EGD, which showed clip in place, patient underwent visceral angiogram and embolization of the inferior division of the splenic artery and its associated short gastric arteries. Anticoagulation for DVT was held, and IVC filter was placed.  Patient was successfully extubated on February 6, transition to nasal cannula with no major complications.   4. Mediastinal mass. CT chest which was done to rule out pulmonary embolism showed soft tissue mass within the aortopulmonary window region of the mediastinum, measuring 4.1 cm, highly suspected for neoplastic lesion. Plan to follow-up as an outpatient once patient more stable. Will need a PET CT and biopsy, follow-up with CT surgery.   5. New onset atrial fibrillation with rapid ventricular response. Patient was found to have atrial fibrillation on the telemetry monitor, on January 28. Due to hemodynamic instability patient was recommended to have IV amiodarone. Not candidate for anticoagulation due to upper GI bleed. She returned to sinus rhythm spontaneously without the need of amiodarone. It was suspected that arrhythmia was triggered by hemodynamic unstability and acute blood loss anemia.  May need further evaluation with outpatient monitoring  to determine if a fib becomes recurrent.   6. Acute kidney injury. Likely multifactorial, including hypovolemic shock, hemorrhagic shock. Kidney function improved with a discharge creatinine 0.90.  7. Escherichia coli bacteremia. Blood cultures from January 24 positive for Escherichia coli, patient was placed on IV ceftriaxone. Follow-up blood cultures were no growth. Patient completed a course of IV antibiotics in the hospital.  8. New onset seizures. February 11 patient had an event of staring into space with bilateral jerking, lasted for 3 minutes. EEG was obtained which revealed slowing over the right temporo-occipital region and occasional epileptiform discharges over the right occipital region. Patient was placed in Keppra 500 mg twice daily.  Further workup with brain MRI was negative for acute changes.   Discharge Diagnoses:  Principal Problem:   Gastrointestinal hemorrhage with melena Active Problems:   Acute blood loss anemia   Lower abdominal pain of unknown etiology   Abnormal computed tomography angiography (CTA) of abdomen and pelvis   Persistent atrial fibrillation with rapid ventricular response ([place])   Encounter for intubation   Respiratory failure ([place])    Discharge Instructions   Allergies as of 04/22/2017   No Known Allergies     Medication List    STOP taking these medications   aspirin 81 MG tablet     TAKE these medications   cholecalciferol 1000 units tablet Commonly known as:  VITAMIN D Take 1,000 Units by mouth 2 (two) times daily.   levETIRAcetam 500 MG tablet Commonly known as:  KEPPRA Take 1 tablet (500 mg total) by mouth 2 (two) times daily.   lisinopril-hydrochlorothiazide 20-12.5 MG tablet Commonly known as:  PRINZIDE,ZESTORETIC Take 1 tablet by mouth daily.   pantoprazole 40 MG tablet Commonly known as:  PROTONIX Take 1 tablet (40 mg total) by mouth 2 (two) times daily.    simvastatin 20 MG tablet Commonly known as:  ZOCOR Take 1 tablet by mouth daily.      Follow-up Information    Grace Isaac, MD Follow up.   Specialty:  Cardiothoracic Surgery Why:  Please contact office once PET CT scan is completed to set up appointment for possible biopsy of mediastinal mass Contact information: [address] [place] [address] [phone]          No Known Allergies  Consultations: Gastroenterology  IR Surgery Neurology  Cardiology   Procedures/Studies: Visceral angiogram and embolization of the inferior division of the splenic artery and its associated short gastric arteries.    Subjective: Patient feeling better, no nausea or vomiting, no chest pain or dyspnea, no further seizure activity.   Discharge Exam: Vitals:   04/22/17 0814 04/22/17 0815  BP:    Pulse:    Resp:    Temp:    SpO2: 97% 97%   Vitals:   04/21/17 2058 04/22/17 0457 04/22/17 0814 04/22/17 0815  BP: (!) 129/54 126/79    Pulse: (!) 110 93    Resp: 18 17    Temp: 97.7 F (36.5 C) 97.8 F (36.6 C)    TempSrc: Oral Oral    SpO2: 96% 98% 97% 97%  Weight:  62 kg (136 lb 11 oz)    Height:        General: Not in pain or dyspnea, deconditioned  Neurology: Awake and alert, non focal  E ENT: mild pallor, no icterus, oral mucosa moist Cardiovascular: No JVD. S1-S2 present, rhythmic, no gallops, rubs, or murmurs. No lower extremity edema. Pulmonary: vesicular breath sounds bilaterally, adequate  air movement, no wheezing, rhonchi or rales. Gastrointestinal. Abdomen flat, no organomegaly, non tender, no rebound or guarding Skin. No rashes Musculoskeletal: no joint deformities   The results of significant diagnostics from this hospitalization (including imaging, microbiology, ancillary and laboratory) are listed below for reference.     Microbiology: No results found for this or any previous visit (from the past 240 hour(s)).   Labs: BNP (last  3 results) No results for input(s): BNP in the last 8760 hours. Basic Metabolic Panel: Recent Labs  Lab 04/19/17 0430 04/19/17 0730 04/19/17 1716 04/20/17 0424 04/21/17 0440 04/22/17 0438  NA 142  --  139 138 141 141  K 2.4*  --  2.5* 3.7 3.6 3.2*  CL 98*  --  99* 102 109 110  CO2 29  --  28 26 24 22   GLUCOSE 80  --  103* 87 85 94  BUN 10  --  8 9 8 6   CREATININE 1.16*  --  1.05* 1.07* 0.94 0.90  CALCIUM 7.9*  --  7.5* 7.4* 7.8* 7.8*  MG  --  1.6* 2.6*  --   --   --    Liver Function Tests: Recent Labs  Lab 04/16/17 0400 04/17/17 0429 04/18/17 0452 04/19/17 0430  AST 93* 66* 50* 46*  ALT 152* 125* 101* 85*  ALKPHOS 40 43 49 58  BILITOT 0.8 0.6 0.8 1.0  PROT 3.5* 3.8* 4.4* 4.8*  ALBUMIN 1.5* 1.7* 1.9* 2.1*   Recent Labs  Lab 04/19/17 0430  LIPASE 30   No results for input(s): AMMONIA in the last 168 hours. CBC: Recent Labs  Lab 04/18/17 0452 04/19/17 0430 04/20/17 0424 04/21/17 0440 04/22/17 0438  WBC 6.5 9.6 6.1 4.8 4.3  NEUTROABS  --  7.5 3.8 2.8 2.3  HGB 9.8* 10.5* 8.5* 8.5* 8.7*  HCT 31.2* 32.8* 27.0* 27.6* 27.9*  MCV 96.9 95.9 97.1 98.6 98.6  PLT 81* 111* 103* 124* 158   Cardiac Enzymes: Recent Labs  Lab 04/19/17 0430  TROPONINI <0.03   BNP: Invalid input(s): POCBNP CBG: Recent Labs  Lab 04/21/17 1604 04/21/17 2056 04/22/17 0056 04/22/17 0445 04/22/17 0829  GLUCAP 104* 105* 82 91 75   D-Dimer No results for input(s): DDIMER in the last 72 hours. Hgb A1c No results for input(s): HGBA1C in the last 72 hours. Lipid Profile No results for input(s): CHOL, HDL, LDLCALC, TRIG, CHOLHDL, LDLDIRECT in the last 72 hours. Thyroid function studies No results for input(s): TSH, T4TOTAL, T3FREE, THYROIDAB in the last 72 hours.  Invalid input(s): FREET3 Anemia work up No results for input(s): VITAMINB12, FOLATE, FERRITIN, TIBC, IRON, RETICCTPCT in the last 72 hours. Urinalysis    Component Value Date/Time   COLORURINE YELLOW 04/02/2017 1705    APPEARANCEUR CLOUDY (A) 04/02/2017 1705   LABSPEC 1.008 04/02/2017 1705   PHURINE 5.0 04/02/2017 1705   GLUCOSEU NEGATIVE 04/02/2017 1705   HGBUR LARGE (A) 04/02/2017 1705   BILIRUBINUR NEGATIVE 04/02/2017 1705   KETONESUR NEGATIVE 04/02/2017 1705   PROTEINUR NEGATIVE 04/02/2017 1705   NITRITE NEGATIVE 04/02/2017 1705   LEUKOCYTESUR LARGE (A) 04/02/2017 1705   Sepsis Labs Invalid input(s): PROCALCITONIN,  WBC,  LACTICIDVEN Microbiology No results found for this or any previous visit (from the past 240 hour(s)).   Time coordinating discharge: 45 minutes  SIGNED:   Tawni Millers, MD  [facility] 04/22/2017, 9:21 AM Pager [phone]  If 7PM-7AM, please contact night-coverage [email or web address] Password [email or web address]

## 2017-04-22 NOTE — Progress Notes (Addendum)
Subjective: No complaints  Exam: Vitals:   04/22/17 0814 04/22/17 0815  BP:    Pulse:    Resp:    Temp:    SpO2: 97% 97%    Physical Exam   HEENT-  Normocephalic, no lesions, without obvious abnormality.  Normal external eye and conjunctiva.   Cardiovascular- S1-S2 audible, pulses palpable throughout   Lungs-no rhonchi or wheezing noted, no excessive working breathing.  Saturations within normal limits Abdomen- All 4 quadrants palpated and nontender Extremities- Warm, dry and intact Musculoskeletal-no joint tenderness, deformity or swelling Skin-warm and dry, no hyperpigmentation, vitiligo, or suspicious lesions    Neuro:  Mental Status: Alert, oriented, thought content appropriate.  Speech fluent without evidence of aphasia.  Able to follow 3 step commands without difficulty. Cranial Nerves: II: Discs flat bilaterally; Visual fields grossly normal,  III,IV, VI: ptosis not present, extra-ocular motions intact bilaterally pupils equal, round, reactive to light and accommodation V,VII: smile symmetric, facial light touch sensation normal bilaterally VIII: hearing normal bilaterally IX,X: uvula rises symmetrically XI: bilateral shoulder shrug XII: midline tongue extension Motor: 4/5 throughout Sensory: Pinprick and light touch intact throughout, bilaterally Deep Tendon Reflexes: 1+ and symmetric throughout     Medications:  Scheduled: . arformoterol  15 mcg Nebulization BID  . budesonide (PULMICORT) nebulizer solution  0.5 mg Nebulization BID  . Chlorhexidine Gluconate Cloth  6 each Topical Daily  . feeding supplement  1 Container Oral TID BM  . Gerhardt's butt cream   Topical TID  . insulin aspart  2-6 Units Subcutaneous Q4H  . levETIRAcetam  500 mg Oral BID  . pantoprazole  40 mg Oral BID  . sodium chloride flush  3 mL Intravenous Q12H    Pertinent Labs/Diagnostics:   Mr Jeri Cos Wo Contrast  Result Date: 04/22/2017 CLINICAL DATA:  Altered mental status,  minimally responsive. Seizure like symptoms. EXAM: MRI HEAD WITHOUT AND WITH CONTRAST TECHNIQUE: Multiplanar, multiecho pulse sequences of the brain and surrounding structures were obtained without and with intravenous contrast. CONTRAST:  73mL MULTIHANCE GADOBENATE DIMEGLUMINE 529 MG/ML IV SOLN COMPARISON:  CT HEAD April 19, 2017 FINDINGS: Mildly motion degraded examination. INTRACRANIAL CONTENTS: No reduced diffusion to suggest acute ischemia or status epilepticus. No susceptibility artifact to suggest hemorrhage. The ventricles and sulci are normal for patient's age. Small area faint bilateral parietal FLAIR T2 hyperintense signal. No midline shift, mass effect or masses. No abnormal extra-axial fluid collections. No extra-axial masses. No abnormal intraparenchymal or extra-axial enhancement. VASCULAR: Normal major intracranial vascular flow voids present at skull base. SKULL AND UPPER CERVICAL SPINE: No abnormal sellar expansion. No suspicious calvarial bone marrow signal. Craniocervical junction maintained. SINUSES/ORBITS: Bilateral mastoid effusions.The included ocular globes and orbital contents are non-suspicious. OTHER: Patient is edentulous. IMPRESSION: 1. Motion degraded examination. 2. 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 3. Otherwise negative MRI of the head with and without contrast for age. Electronically Signed   By: Elon Alas M.D.   On: 04/22/2017 00:34     Etta Quill PA-C [facility] [phone]  Impression: new onset seizure likely provoked by PRES. At this time no offending medications noted and BP was not elevated. PRES may be secondary to electrolyte abnormalities (such as hyponatremia) or rapid correction of anemia with blood transfusion.    Recommendations: 1. At this time would continue Keppra and repeat MRI in two weeks. If PRES is resolved may be gradually tapered off Keppra.  2. Will need  outpatient neurology follow up.  3. Neurology  will sign off  Etta Quill PA-C [facility] [phone] M-F  (9:00 am- 5 PM)  Electronically signed: Dr. Kerney Elbe 04/22/2017, 10:17 AM

## 2017-04-22 NOTE — Progress Notes (Signed)
Physical Therapy Treatment Patient Details Name: Kayla Wyatt MRN: [id number] DOB: 24-Feb-1956 Today's Date: 04/22/2017    History of Present Illness Pt is a 62 y/o female admitted on 04/02/17 secondary to abdominal pain and diarrhea. Pt found to have anemia secondary to a GI bleed. Pt's course complicated due to an acute R LE DVT and now s/p IVC filter placement on 2/3. Pt hypotensive and tachycardic requiring intubation from 2/3 to 2/6 with an emergent EGD. PMH including but not limited to HTN.    PT Comments    Pt continues to make progress towards her goals, however continues to be limited by oxygen desaturation and fatigue. Pt is currently min guard for bed mobility, transfers and ambulation of 50 feet with RW. Pt is looking forward to discharging to CIR today.     Follow Up Recommendations  CIR;Supervision/Assistance - 24 hour     Equipment Recommendations  None recommended by PT;Other (comment)(TBD)    Recommendations for Other Services Rehab consult     Precautions / Restrictions Precautions Precautions: Fall Restrictions Weight Bearing Restrictions: No    Mobility  Bed Mobility Overal bed mobility: Needs Assistance Bed Mobility: Supine to Sit   Sidelying to sit: Min guard       General bed mobility comments: min guard for safety with coming to EoB, c/o of dizziness wihich quickly dissipated   Transfers Overall transfer level: Needs assistance Equipment used: Rolling walker (2 wheeled) Transfers: Sit to/from [facility] Sit to Stand: Min assist         General transfer comment: min guard for safety with power up and steadying   Ambulation/Gait Ambulation/Gait assistance: Min guard Ambulation Distance (Feet): 50 Feet Assistive device: Rolling walker (2 wheeled) Gait Pattern/deviations: Step-through pattern;Decreased step length - right;Decreased step length - left;Trunk flexed Gait velocity: slowed Gait velocity interpretation: Below  normal speed for age/gender General Gait Details: hands on min guard for safety, vc for proximity to RW and upright posture, pt fatigued after 50 feet of ambulation and rolled back to room in recliner         Balance Overall balance assessment: Needs assistance Sitting-balance support: Feet supported Sitting balance-Leahy Scale: Good Sitting balance - Comments: sat EOB to eat her breakfast x5 mins   Standing balance support: Bilateral upper extremity supported Standing balance-Leahy Scale: Fair Standing balance comment: able to stand statically and adjust gown without UE support                            Cognition Arousal/Alertness: Awake/alert Behavior During Therapy: Anxious Overall Cognitive Status: Within Functional Limits for tasks assessed                                           General Comments General comments (skin integrity, edema, etc.): Pt on 2L O2 via nasal cannula at entry SaO2 97% O2 with ambulation SaO2 dropped to 87%, quickly rebounded with sitting and pursed lipped breathing       Pertinent Vitals/Pain Pain Assessment: Faces Faces Pain Scale: Hurts a little bit Pain Location: abdomen Pain Descriptors / Indicators: Cramping Pain Intervention(s): Limited activity within patient's tolerance;Monitored during session;Repositioned           PT Goals (current goals can now be found in the care plan section) Acute Rehab PT Goals PT Goal Formulation: With patient Time For  Goal Achievement: 04/30/17 Potential to Achieve Goals: Good Progress towards PT goals: Progressing toward goals    Frequency    Min 3X/week      PT Plan Current plan remains appropriate       AM-PAC PT "6 Clicks" Daily Activity  Outcome Measure  Difficulty turning over in bed (including adjusting bedclothes, sheets and blankets)?: A Little Difficulty moving from lying on back to sitting on the side of the bed? : A Little Difficulty sitting down on  and standing up from a chair with arms (e.g., wheelchair, bedside commode, etc,.)?: Unable Help needed moving to and from a bed to chair (including a wheelchair)?: A Lot Help needed walking in hospital room?: A Lot Help needed climbing 3-5 steps with a railing? : Total 6 Click Score: 12    End of Session Equipment Utilized During Treatment: Gait belt;Oxygen Activity Tolerance: Patient limited by fatigue Patient left: in chair;with chair alarm set;with call bell/phone within reach;with nursing/sitter in room Nurse Communication: Mobility status PT Visit Diagnosis: Other abnormalities of gait and mobility (R26.89);Pain Pain - part of body: (abdomen)     Time: 4081-4481 PT Time Calculation (min) (ACUTE ONLY): 23 min  Charges:  $Gait Training: 23-37 mins                    G Codes:       Liam Cammarata B. Migdalia Dk PT, DPT Acute Rehabilitation  [phone] Pager [phone]     [place] 04/22/2017, 1:35 PM

## 2017-04-22 NOTE — Progress Notes (Addendum)
Inpatient Rehabilitation  I have insurance authorization for an IP Rehab admission; however, note that patient remains on IV Keppra and needs to be transitioned to PO prior to potential admission today.  Notified nurse case manager and MD.  Call if questions.  Carmelia Roller., CCC/SLP Admission Coordinator  [place]  Cell [phone]

## 2017-04-22 NOTE — Progress Notes (Signed)
Inpatient Rehabilitation  I have received medical clearance, have insurance authorization, and a bed to offer today.  Patient in agreement with plan for IP Rehab admission.  Plan to proceed with admission today.  Discussed with team; call with questions.   Carmelia Roller., CCC/SLP Admission Coordinator  [place]  Cell [phone]

## 2017-04-22 NOTE — IPOC Note (Signed)
Overall Plan of Care [facility]) Patient Details Name: Kayla Wyatt MRN: [id number] DOB: 1955-11-04  Admitting Diagnosis: PRES  Hospital Problems: Active Problems:   PRES     Functional Problem List: Nursing Bladder, Endurance, Safety, Skin Integrity  PT Balance, Endurance, Motor, Pain, Skin Integrity  OT Balance, Endurance, Motor  SLP    TR         Basic ADL's: OT Bathing, Dressing, Toileting, Grooming     Advanced  ADL's: OT Light Housekeeping     Transfers: PT Bed Mobility, Bed to Chair, Car, Manufacturing systems engineer, Metallurgist: PT Ambulation, Stairs     Additional Impairments: OT None  SLP        TR      Anticipated Outcomes Item Anticipated Outcome  Self Feeding no goal  Swallowing      Basic self-care  mod I  Toileting  mod I   Bathroom Transfers mod I  Bowel/Bladder  Continent to bowel and bladder with min. assist.  Transfers  mod I   Locomotion  mod I household mobility; supervision community or longer distance  Communication     Cognition     Pain  Less than 3,on 1 to 10 scale.  Safety/Judgment  Free from falls during her stay in rehab.   Therapy Plan: PT Intensity: Minimum of 1-2 x/day ,45 to 90 minutes PT Frequency: 5 out of 7 days PT Duration Estimated Length of Stay: 7-10 days OT Intensity: Minimum of 1-2 x/day, 45 to 90 minutes OT Frequency: 5 out of 7 days OT Duration/Estimated Length of Stay: 7-10 days      Team Interventions: Nursing Interventions Patient/Family Education, Bladder Management  PT interventions Ambulation/gait training, Training and development officer, Community reintegration, Discharge planning, Disease management/prevention, DME/adaptive equipment instruction, Functional mobility training, Neuromuscular re-education, Pain management, Patient/family education, Psychosocial support, Skin care/wound management, Stair training, Therapeutic Activities, Therapeutic Exercise, UE/LE Strength taining/ROM, UE/LE  Coordination activities, Wheelchair propulsion/positioning  OT Interventions Training and development officer, Discharge planning, DME/adaptive equipment instruction, Functional mobility training, Patient/family education, Psychosocial support, Self Care/advanced ADL retraining, Therapeutic Exercise, UE/LE Strength taining/ROM, Therapeutic Activities  SLP Interventions    TR Interventions    SW/CM Interventions Discharge Planning, Psychosocial Support, Patient/Family Education   Barriers to Discharge MD  Medical stability and New oxygen  Nursing      PT New oxygen goal for O2 to be weaned prior to d/c  OT      SLP      SW       Team Discharge Planning: Destination: PT-Home ,OT- Home , SLP-Home Projected Follow-up: PT-Home health PT, Outpatient PT, OT-  Home health OT, SLP-None Projected Equipment Needs: PT-To be determined, OT- Tub/shower bench, SLP-None recommended by SLP Equipment Details: PT-RW or rollator likley at d/c for energy conservation/balance, OT-  Patient/family involved in discharge planning: PT- Patient, Family member/caregiver,  OT-Patient, Family member/caregiver, SLP-Patient, Family member/caregiver  MD ELOS: 6-9 days. Medical Rehab Prognosis:  Good Assessment:  62 y.o.femalewith history of HTN who was admitted on 11/24/169 with abdominal pain, hematemesis, melena and ABLA due to GIB. She underwent clipping of gastric ulcer and received multiple units PRBC but continued to have drop in H/H with bloody stool. NM GI scan was negative for bleeding and she underwent colonoscopy by Dr. [name] 1/31 showing diverticulosis but no signs of bleeding. Hospital course significant for R- gastrocnemius DVT, PAF, incidental findings of mediastinal mass as well as brief episode of hematuria. She was cleared to start on Xarelto on  02/01 for treatment of DVT. On  2/2 am she developed hematemesis with maroon stools and hypotension requiring fluid bolus as well as 2 units PRBCs. She continued  to decline requiring intubation, pressors as well as reversal of anticoagulation with Kcentra on 02/3. She underwent UGI revealing large clot with fresh blood in stomach and underwent mesenteric arteriogram with percutaneous coil embolization for distal tributary of left gastric artery and placement of IVC filter by interventional radiology. A fib with RVR felt to be due to hemorrhagic shock and she converted to NSR. [name] felt no further cardiac work up indicated and did not recommend initiating anticoagulation in short term. To follow up with [name] after PET scan and routine biopsy after medically stable. She continued to have bleeding and underwent visceral angiogram with embolization of inferior division of splenic artery and associated gastric arteries on 2/4. No surgical intervention needed per [name] with recommendations to continue monitoring H/H as well as for recurrence of hematochezia. She tolerated extubation on 2/6 and respiratory status improving. On 2/11, she has episode of unresponsiveness with jerking of bilateral limbs that lasted about 3 minutes. She had amnesia of events but was back to baseline.  EEG revealed "focal slowing over the right temporo-occipital regionandoccasional epileptiform discharges over the right occipital region". Head CT reviewed, unremarkable for acute intracranial process. MRI brain done revealing mild biparietal signal abnormality suspicious for PRES.  [name] recommended starting patient on Keppra BID with repeat MRI in 2 weeks. New onset seizure likely provoked by PRES--if resolved--Ok to take patient off Keppra.  Patient with resultant generalized weakness, endurance deficits, and limitation in self-care.  Will set goals for Mod I with PT/OT.   See Team Conference Notes for weekly updates to the plan of care

## 2017-04-22 NOTE — PMR Pre-admission (Addendum)
PMR Admission Coordinator Pre-Admission Assessment  Patient: Kayla Wyatt is an 62 y.o., female MRN: [id number] DOB: June 12, 1955 Height: 5\' 3"  (160 cm) Weight: 62 kg (136 lb 11 oz)              Insurance Information HMO:     PPO:      PCP:      IPA:      80/20:      OTHER: Blue Option with [employer] PRIMARY: BCBS      Policy#: [id number]      Subscriber: Self CM Name: Celso Amy      Phone#: [phone]     Fax#: [phone] Pre-Cert#: [id number]   7/34/19-3/79/02 with updates due 05/05/17   Employer: Full [employer] Benefits:  Phone #: [phone]     Name: Verified online at [email or web address] Eff. Date: 01/08/17-03/09/18     Deduct: $3500      Out of Pocket Max: [phone]      Life Max: N/A CIR: 80%/20%      SNF: 80%/20%, 60 day limit Outpatient: 30 visits SLP & PT/OT combined      Co-Pay: $50 Home Health: Necessity with Pre-Authorization, 80%      Co-Pay: 20% DME: 80%     Co-Pay: 20% Providers: In-network  SECONDARY: None        Medicaid Application Date:       Case Manager:  Disability Application Date:       Case Worker:   Emergency Contact Information Contact Information    Name Relation Home Work [place] [phone]       Current Medical History  Patient Admitting Diagnosis: PRES  History of Present Illness: Kayla Wyatt a 62 y.o.femalewith history of HTN who was admitted on 04/02/17 with abdominal pain, hematemesis, melena and ABLA due to GIB. She underwent clipping of gastric ulcer and received multiple units PRBC but continued to have drop in H/H with bloody stool. NM GI scan was negative for bleeding and she underwent colonoscopy by Dr. [name] 1/31 showing diverticulosis but no signs of bleeding. Hospital course significant for R- gastrocnemius DVT, PAF, incidental findings of mediastinal mass as well as brief episode of hematuria. She was cleared to start on Xarelto on 02/01 for treatment of DVT. On  2/2 am she developed hematemesis with maroon  stools and hypotension requiring fluid bolus as well as 2 units PRBCs. She continued to decline requiring intubation, pressors as well as reversal of anticoagulation with Kcentra on 02/3. She underwent UGI revealing large clot with fresh blood in stomach and underwent mesenteric arteriogram with percutaneous coil embolization fo distal tributary of left gastric artery and placement of IVC filter by interventional radiology.   A fib with RVR felt to be due to hemorrhagic shock and she converted to NSR. [name] felt no further cardiac work up indicated and did not recommend initiating anticoagulation in short term. To follow up with [name] after PET scan and routine biopsy after medically stable. She continued to have bleeding and underwent visceral angiogram with embolization of inferior division of splenic artery and associated gastric arteries on 2/4. No surgical intervention needed per [name] with recommendations to continue monitoring H/H as well as for recurrence of hematochezia. She tolerated extubation on 2/6 and respiratory status improving.   On 2/11, she has episode of unresponsiveness with jerking of bilateral limbs that lasted about 3 minutes. She had amnesia of events but was back to baseline.  EEG revealed "focal slowing  over the right temporo-occipital regionandoccasional epileptiform discharges over the right occipital region." Head CT was negative for acute changes but MRI brain done revealing mild biparietal signal abnormality suspicious for PRES.  [name] recommended starting patient on Keppra BID with repeat MRI in 2 weeks. New onset seizure likely provoked by PRES--if resolved--Ok to take patient off Keppra.  Patient with resultant critical illness myopathy LE > UE and  CIR recommended due to functional deficits and patient admitted 04/22/17.        Past Medical History  Past Medical History:  Diagnosis Date  . Acute blood loss anemia 04/02/2017  .  Gastrointestinal hemorrhage with melena   . Hypercholesteremia   . Hypertension   . Persistent atrial fibrillation with rapid ventricular response ([place]) 04/07/2017  . Vitamin D deficiency     Family History  family history includes Heart disease in her father.  Prior Rehab/Hospitalizations:  Has the patient had major surgery during 100 days prior to admission? No  Current Medications   Current Facility-Administered Medications:  .  0.9 %  sodium chloride infusion, 250 mL, Intravenous, PRN, Omar Person, NP .  acetaminophen (TYLENOL) tablet 650 mg, 650 mg, Oral, Q6H PRN, 650 mg at 04/21/17 2105 **OR** acetaminophen (TYLENOL) suppository 650 mg, 650 mg, Rectal, Q6H PRN, Elwin Mocha, MD .  arformoterol [facility]) nebulizer solution 15 mcg, 15 mcg, Nebulization, BID, Diallo, Abdoulaye, MD, 15 mcg at 04/22/17 0814 .  budesonide (PULMICORT) nebulizer solution 0.5 mg, 0.5 mg, Nebulization, BID, Diallo, Abdoulaye, MD, 0.5 mg at 04/22/17 0815 .  Chlorhexidine Gluconate Cloth 2 % PADS 6 each, 6 each, Topical, Daily, Juanito Doom, MD, 6 each at 04/22/17 [phone] .  feeding supplement (BOOST / RESOURCE BREEZE) liquid 1 Container, 1 Container, Oral, TID BM, Juanito Doom, MD, 1 Container at 04/22/17 [phone] .  Gerhardt's butt cream, , Topical, TID, Aline August, MD, 1 application at 51/70/01 0906 .  gi cocktail (Maalox,Lidocaine,Donnatal), 30 mL, Oral, TID PRN, Alma Friendly, MD, 30 mL at 04/18/17 1248 .  insulin aspart (novoLOG) injection 2-6 Units, 2-6 Units, Subcutaneous, Q4H, Omar Person, NP, 4 Units at 04/22/17 1241 .  levETIRAcetam (KEPPRA) tablet 500 mg, 500 mg, Oral, BID, Arrien, Jimmy Picket, MD .  lidocaine (PF) (XYLOCAINE) 1 % injection, , , PRN, Jacqulynn Cadet, MD, 15 mL at 04/13/17 1738 .  menthol-cetylpyridinium (CEPACOL) lozenge 3 mg, 1 lozenge, Oral, PRN, Alma Friendly, MD, 3 mg at 04/20/17 1251 .  nicotine (NICODERM CQ - dosed in mg/24  hours) patch 14 mg, 14 mg, Transdermal, Daily PRN, Elwin Mocha, MD .  ondansetron [facility]) tablet 4 mg, 4 mg, Oral, Q6H PRN **OR** ondansetron (ZOFRAN) injection 4 mg, 4 mg, Intravenous, Q6H PRN, Elwin Mocha, MD .  pantoprazole (PROTONIX) EC tablet 40 mg, 40 mg, Oral, BID, Riccio, Angela C, DO, 40 mg at 04/22/17 0914 .  sodium chloride flush (NS) 0.9 % injection 10-40 mL, 10-40 mL, Intracatheter, PRN, Simonne Maffucci B, MD, 20 mL at 04/21/17 0443 .  sodium chloride flush (NS) 0.9 % injection 3 mL, 3 mL, Intravenous, Q12H, Elwin Mocha, MD, 3 mL at 04/22/17 0907 .  white petrolatum (VASELINE) gel, , Topical, PRN, Alma Friendly, MD, 1 application at 74/94/49 1251  Patients Current Diet: Seizure precautions DIET SOFT Room service appropriate? Yes; Fluid consistency: Thin Diet - low sodium heart healthy  Precautions / Restrictions Precautions Precautions: Fall Restrictions Weight Bearing Restrictions: No   Has the patient had  2 or more falls or a fall with injury in the past year?No  Prior Activity Level Community (5-7x/wk): Prior to admission patient was fully independnet and active. 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    Home Assistive Devices / Equipment Home Assistive Devices/Equipment: None Home Equipment: None  Prior Device Use: Indicate devices/aids used by the patient prior to current illness, exacerbation or injury? None of the above  Prior Functional Level Prior Function Level of Independence: Independent Comments: works  Self Care: Did the patient need help bathing, dressing, using the toilet or eating? Independent  Indoor Mobility: Did the patient need assistance with walking from room to room (with or without device)? Independent  Stairs: Did the patient need assistance with internal or external stairs (with or without device)? Independent  Functional Cognition: Did the patient need help planning regular tasks such  as shopping or remembering to take medications? Independent  Current Functional Level Cognition  Overall Cognitive Status: Within Functional Limits for tasks assessed Orientation Level: Oriented X4 General Comments: anxious about movement and requiring increased motivation for participation    Extremity Assessment (includes Sensation/Coordination)  Upper Extremity Assessment: Overall WFL for tasks assessed, Generalized weakness  Lower Extremity Assessment: Defer to PT evaluation RLE Deficits / Details: Slightly limited by soreness    ADLs  Overall ADL's : Needs assistance/impaired Eating/Feeding: Set up, Sitting Grooming: Min guard, Sitting, Minimal assistance Upper Body Bathing: Maximal assistance, Sitting Lower Body Bathing: Maximal assistance, +2 for safety/equipment Upper Body Dressing : Maximal assistance, Sitting Lower Body Dressing: Maximal assistance, +2 for safety/equipment, Sit to/from stand Toilet Transfer: Moderate assistance, +2 for safety/equipment, Stand-pivot Toileting- Clothing Manipulation and Hygiene: Maximal assistance, Sit to/from stand, +2 for safety/equipment Tub/ Shower Transfer: Maximal assistance, +2 for safety/equipment, Stand-pivot, Rolling walker General ADL Comments: Pt received in bed with nursing students assisting with bathing. Pt then completed bed mobility, sat EOB aobut 2 minutes then took pivotol steps from EOB>recliner. Pt very unsteady on feet and fearful of falling.     Mobility  Overal bed mobility: Needs Assistance Bed Mobility: Supine to Sit Rolling: Min assist Sidelying to sit: Min guard Supine to sit: Min assist General bed mobility comments: min guard for safety with coming to EoB, c/o of dizziness wihich quickly dissipated     Transfers  Overall transfer level: Needs assistance Equipment used: Rolling walker (2 wheeled) Transfers: Sit to/from Stand, Stand Pivot Transfers Sit to Stand: Min assist Stand pivot transfers: Min assist,  +2 physical assistance General transfer comment: min guard for safety with power up and steadying     Ambulation / Gait / Stairs / Wheelchair Mobility  Ambulation/Gait Ambulation/Gait assistance: Physicist, medical (Feet): 50 Feet Assistive device: Rolling walker (2 wheeled) Gait Pattern/deviations: Step-through pattern, Decreased step length - right, Decreased step length - left, Trunk flexed General Gait Details: hands on min guard for safety, vc for proximity to RW and upright posture, pt fatigued after 50 feet of ambulation and rolled back to room in recliner Gait velocity: slowed Gait velocity interpretation: Below normal speed for age/gender    Posture / Balance Dynamic Sitting Balance Sitting balance - Comments: sat EOB to eat her breakfast x5 mins Balance Overall balance assessment: Needs assistance Sitting-balance support: Feet supported Sitting balance-Leahy Scale: Good Sitting balance - Comments: sat EOB to eat her breakfast x5 mins Standing balance support: Bilateral upper extremity supported Standing balance-Leahy Scale: Fair Standing balance comment: able to stand statically and adjust  gown without UE support    Special needs/care consideration BiPAP/CPAP: No CPM: No Continuous Drip IV: No Dialysis: No         Life Vest: No Oxygen: None PTA,now on 1.5L nasal cannula  Special Bed: No Trach Size: No Wound Vac (area): No       Skin: Dry and cracked buttocks, left heel; Moisture associated skin damage to peri, groin, and buttocks; Ecchymosis to bilateral upper extremities                              Bowel mgmt: Continent, last BM 04/20/17 Bladder mgmt: Continent  Diabetic mgmt: No     Previous Home Environment Living Arrangements: Other relatives Available Help at Discharge: Family, Available PRN/intermittently Type of Home: House Home Layout: One level(except 1 step into some areas) Home Access: Level entry Derma: No  Discharge Living  Setting Plans for Discharge Living Setting: Patient's home, Lives with (comment)(brother ) Type of Home at Discharge: House(moving in May ) Discharge Home Layout: One level, Other (Comment)(1 step into kitchen from dining room and living room ) Discharge Home Access: Level entry Discharge Bathroom Shower/Tub: Tub/shower unit, Door Discharge Bathroom Toilet: Standard Discharge Bathroom Accessibility: Yes How Accessible: Accessible via walker Does the patient have any problems obtaining your medications?: No  Social/Family/Support Systems Patient Roles: Other (Comment)(Sister and aunt) Contact Information: Brother: Jonella Redditt [phone] Anticipated Caregiver: Brother  Anticipated Caregiver's Contact Information: see above  Ability/Limitations of Caregiver: None Caregiver Availability: 24/7 Discharge Plan Discussed with Primary Caregiver: Yes Is Caregiver In Agreement with Plan?: Yes Does Caregiver/Family have Issues with Lodging/Transportation while Pt is in Rehab?: No  Goals/Additional Needs Patient/Family Goal for Rehab: PT/OT: Supervision-Min A; SLP: Mod I Expected length of stay: 2-3 weeks  Cultural Considerations: Independent [employer] Dietary Needs: Soft diet  Equipment Needs: TBD Special Service Needs: None Additional Information: Patient and brother moving toward [place] in May. Pt/Family Agrees to Admission and willing to participate: Yes Program Orientation Provided & Reviewed with Pt/Caregiver Including Roles  & Responsibilities: Yes Additional Information Needs: Since new home set-up is unknown team may want to address stair goal Information Needs to be Provided By: Team FYI  Decrease burden of Care through IP rehab admission: No  Possible need for SNF placement upon discharge: No  Patient Condition: This patient's medical and functional status has changed since the consult dated: 04/16/17 in which the Rehabilitation Physician determined and documented that the  patient's condition is appropriate for intensive rehabilitative care in an inpatient rehabilitation facility. See "History of Present Illness" (above) for medical update. Functional changes are: Min A transfers, Min guard 50 feet with rolling walker. Patient's medical and functional status update has been discussed with the Rehabilitation physician and patient remains appropriate for inpatient rehabilitation. Will admit to inpatient rehab today.  Preadmission Screen Completed By:  Gunnar Fusi, 04/22/2017 2:56 PM ______________________________________________________________________   Discussed status with Dr. Posey Pronto on 04/22/17 at 1455 and received telephone approval for admission today.  Admission Coordinator:  Gunnar Fusi, time 1455/Date 04/22/17

## 2017-04-23 ENCOUNTER — Inpatient Hospital Stay (HOSPITAL_COMMUNITY): Payer: BLUE CROSS/BLUE SHIELD | Admitting: Speech Pathology

## 2017-04-23 ENCOUNTER — Inpatient Hospital Stay (HOSPITAL_COMMUNITY): Payer: BLUE CROSS/BLUE SHIELD | Admitting: Occupational Therapy

## 2017-04-23 ENCOUNTER — Inpatient Hospital Stay (HOSPITAL_COMMUNITY): Payer: BLUE CROSS/BLUE SHIELD

## 2017-04-23 DIAGNOSIS — I6783 Posterior reversible encephalopathy syndrome: Principal | ICD-10-CM

## 2017-04-23 DIAGNOSIS — E8809 Other disorders of plasma-protein metabolism, not elsewhere classified: Secondary | ICD-10-CM

## 2017-04-23 DIAGNOSIS — R739 Hyperglycemia, unspecified: Secondary | ICD-10-CM

## 2017-04-23 DIAGNOSIS — J9601 Acute respiratory failure with hypoxia: Secondary | ICD-10-CM

## 2017-04-23 DIAGNOSIS — E876 Hypokalemia: Secondary | ICD-10-CM

## 2017-04-23 DIAGNOSIS — D62 Acute posthemorrhagic anemia: Secondary | ICD-10-CM

## 2017-04-23 DIAGNOSIS — E46 Unspecified protein-calorie malnutrition: Secondary | ICD-10-CM

## 2017-04-23 LAB — COMPREHENSIVE METABOLIC PANEL
ALT: 37 U/L (ref 14–54)
AST: 20 U/L (ref 15–41)
Albumin: 1.9 g/dL — ABNORMAL LOW (ref 3.5–5.0)
Alkaline Phosphatase: 51 U/L (ref 38–126)
Anion gap: 9 (ref 5–15)
BUN: 9 mg/dL (ref 6–20)
CO2: 23 mmol/L (ref 22–32)
Calcium: 8.2 mg/dL — ABNORMAL LOW (ref 8.9–10.3)
Chloride: 109 mmol/L (ref 101–111)
Creatinine, Ser: 0.93 mg/dL (ref 0.44–1.00)
GFR calc Af Amer: >60 mL/min (ref >60)
GFR calc non Af Amer: >60 mL/min (ref >60)
Glucose, Bld: 91 mg/dL (ref 65–99)
Potassium: 3.6 mmol/L (ref 3.5–5.1)
Sodium: 141 mmol/L (ref 135–145)
Total Bilirubin: 0.3 mg/dL (ref 0.3–1.2)
Total Protein: 4.7 g/dL — ABNORMAL LOW (ref 6.5–8.1)

## 2017-04-23 LAB — CBC WITH DIFFERENTIAL/PLATELET
Basophils Absolute: 0 10*3/uL (ref 0.0–0.1)
Basophils Relative: 0 %
Eosinophils Absolute: 0.1 10*3/uL (ref 0.0–0.7)
Eosinophils Relative: 3 %
HCT: 26 % — ABNORMAL LOW (ref 36.0–46.0)
Hemoglobin: 8 g/dL — ABNORMAL LOW (ref 12.0–15.0)
Lymphocytes Relative: 36 %
Lymphs Abs: 1.7 10*3/uL (ref 0.7–4.0)
MCH: 30.3 pg (ref 26.0–34.0)
MCHC: 30.8 g/dL (ref 30.0–36.0)
MCV: 98.5 fL (ref 78.0–100.0)
Monocytes Absolute: 0.5 10*3/uL (ref 0.1–1.0)
Monocytes Relative: 10 %
Neutro Abs: 2.4 10*3/uL (ref 1.7–7.7)
Neutrophils Relative %: 51 %
Platelets: 156 10*3/uL (ref 150–400)
RBC: 2.64 MIL/uL — ABNORMAL LOW (ref 3.87–5.11)
RDW: 16.6 % — ABNORMAL HIGH (ref 11.5–15.5)
WBC: 4.8 10*3/uL (ref 4.0–10.5)

## 2017-04-23 LAB — GLUCOSE, CAPILLARY: Glucose-Capillary: 91 mg/dL (ref 65–99)

## 2017-04-23 LAB — MAGNESIUM: Magnesium: 1.9 mg/dL (ref 1.7–2.4)

## 2017-04-23 MED ORDER — MAGNESIUM OXIDE 400 (241.3 MG) MG PO TABS
200.0000 mg | ORAL_TABLET | Freq: Every day | ORAL | Status: DC
  Administered 2017-04-23 – 2017-04-27 (×5): 200 mg via ORAL
  Filled 2017-04-23 (×4): qty 1

## 2017-04-23 NOTE — Care Management (Signed)
[facility] Individual Statement of Services  Patient Name:  Kayla Wyatt  Date:  04/23/2017  Welcome to the [facility].  Our goal is to provide you with an individualized program based on your diagnosis and situation, designed to meet your specific needs.  With this comprehensive rehabilitation program, you will be expected to participate in at least 3 hours of rehabilitation therapies Monday-Friday, with modified therapy programming on the weekends.  Your rehabilitation program will include the following services:  Physical Therapy (PT), Occupational Therapy (OT), Speech Therapy (ST), 24 hour per day rehabilitation nursing, Therapeutic Recreaction (TR), Neuropsychology, Case Management (Social Worker), Rehabilitation Medicine, Nutrition Services and Pharmacy Services  Weekly team conferences will be held on Wednesdays to discuss your progress.  Your Social Worker will talk with you frequently to get your input and to update you on team discussions.  Team conferences with you and your family in attendance may also be held.  Expected length of stay: 7-10 days    Overall anticipated outcome: modified independent  Depending on your progress and recovery, your program may change. Your Social Worker will coordinate services and will keep you informed of any changes. Your Social Worker's name and contact numbers are listed  below.  The following services may also be recommended but are not provided by the [facility] will be made to provide these services after discharge if needed.  Arrangements include referral to agencies that provide these services.  Your insurance has been verified to be:  [place] Your primary doctor is:  Kennis Buell   Pertinent information will be shared with your doctor  and your insurance company.  Social Worker:  [place], [place] or [phone]   Information discussed with and copy given to patient by: Lennart Pall, 04/23/2017, 3:17 PM

## 2017-04-23 NOTE — Evaluation (Signed)
Physical Therapy Assessment and Plan  Patient Details  Name: Kayla Wyatt MRN: [id number] Date of Birth: 1955-05-14  PT Diagnosis: Difficulty walking, Muscle weakness and Pain in abdomen/groin Rehab Potential: Good ELOS: 7-10 days   Today's Date: 04/23/2017 PT Individual Time: 1305-1420 PT Individual Time Calculation (min): 75 min    Problem List:  Patient Active Problem List   Diagnosis Date Noted  . Hypoalbuminemia due to protein-calorie malnutrition ([place])   . Hypomagnesemia   . Hyperglycemia   . Critical illness myopathy 04/22/2017  . PRES (posterior reversible encephalopathy syndrome)   . Acute deep vein thrombosis (DVT) of right lower extremity ([place])   . Pressure injury of sacral region, unstageable ([place])   . Gastrointestinal hemorrhage with hematemesis   . Mediastinal mass   . Hypokalemia   . Hypocalcemia   . Supplemental oxygen dependent   . Encounter for intubation   . Respiratory failure ([place])   . Persistent atrial fibrillation with rapid ventricular response ([place]) 04/07/2017  . Gastrointestinal hemorrhage with melena   . Lower abdominal pain of unknown etiology   . Abnormal computed tomography angiography (CTA) of abdomen and pelvis   . Acute blood loss anemia 04/02/2017    Past Medical History:  Past Medical History:  Diagnosis Date  . Acute blood loss anemia 04/02/2017  . Gastrointestinal hemorrhage with melena   . Hypercholesteremia   . Hypertension   . Persistent atrial fibrillation with rapid ventricular response ([place]) 04/07/2017  . Vitamin D deficiency    Past Surgical History:  Past Surgical History:  Procedure Laterality Date  . COLONOSCOPY Left 04/09/2017   Procedure: COLONOSCOPY;  Surgeon: Carol Ada, MD;  Location: [facility];  Service: Endoscopy;  Laterality: Left;  . ESOPHAGOGASTRODUODENOSCOPY N/A 04/03/2017   Procedure: ESOPHAGOGASTRODUODENOSCOPY (EGD);  Surgeon: Carol Ada, MD;  Location: [facility];  Service: Endoscopy;  Laterality:  N/A;  . ESOPHAGOGASTRODUODENOSCOPY (EGD) WITH PROPOFOL N/A 04/12/2017   Procedure: ESOPHAGOGASTRODUODENOSCOPY (EGD) WITH PROPOFOL;  Surgeon: Milus Banister, MD;  Location[facility] ENDOSCOPY;  Service: Endoscopy;  Laterality: N/A;  . IR ANGIOGRAM SELECTIVE EACH ADDITIONAL VESSEL  04/12/2017  . IR ANGIOGRAM SELECTIVE EACH ADDITIONAL VESSEL  04/13/2017  . IR ANGIOGRAM SELECTIVE EACH ADDITIONAL VESSEL  04/13/2017  . IR ANGIOGRAM SELECTIVE EACH ADDITIONAL VESSEL  04/13/2017  . IR ANGIOGRAM VISCERAL SELECTIVE  04/12/2017  . IR ANGIOGRAM VISCERAL SELECTIVE  04/13/2017  . IR EMBO ART  VEN HEMORR LYMPH EXTRAV  INC GUIDE ROADMAPPING  04/12/2017  . IR EMBO ART  VEN HEMORR LYMPH EXTRAV  INC GUIDE ROADMAPPING  04/13/2017  . IR FLUORO GUIDE CV LINE RIGHT  04/13/2017  . IR IVC FILTER PLMT / S&I /IMG GUID/MOD SED  04/12/2017  . IR US GUIDE VASC ACCESS RIGHT  04/12/2017  . IR US GUIDE VASC ACCESS RIGHT  04/13/2017  . IR US GUIDE VASC ACCESS RIGHT  04/13/2017  . LEG SURGERY  1974   Blood Clot Removal     Assessment & Plan Clinical Impression: Patient is a 62 y.o.femalewith history of HTN who was admitted on 11/24/169 with abdominal pain, hematemesis, melena and ABLA due to GIB. History taken from chart review. She underwent clipping of gastric ulcer and received multiple units PRBC but continued to have drop in H/H with bloody stool. NM GI scan was negative for bleeding and she underwent colonoscopy by Dr. [name] 1/31 showing diverticulosis but no signs of bleeding. Hospital course significant for R- gastrocnemius DVT, PAF, incidental findings of mediastinal mass as well as brief episode  of hematuria. She was cleared to start on Xarelto on 02/01 for treatment of DVT. On  2/2 am she developed hematemesis with maroon stools and hypotension requiring fluid bolus as well as 2 units PRBCs. She continued to decline requiring intubation, pressors as well as reversal of anticoagulation with Kcentra on 02/3. She underwent UGI revealing large clot with  fresh blood in stomach and underwent mesenteric arteriogram with percutaneous coil embolization fo distal tributary of left gastric artery and placement of IVC filter by interventional radiology.   A fib with RVR felt to be due to hemorrhagic shock and she converted to NSR. [name] felt no further cardiac work up indicated and did not recommend initiating anticoagulation in short term. To follow up with [name] after PET scan and routine biopsy after medically stable. She continued to have bleeding and underwent visceral angiogram with embolization of inferior division of splenic artery and associated gastric arteries on 2/4. No surgical intervention needed per [name] with recommendations to continue monitoring H/H as well as for recurrence of hematochezia. She tolerated extubation on 2/6 and respiratory status improving.   On 2/11, she has episode of unresponsiveness with jerking of bilateral limbs that lasted about 3 minutes. She had amnesia of events but was back to baseline.  EEG revealed "focal slowing over the right temporo-occipital regionandoccasional epileptiform discharges over the right occipital region". Head CT reviewed, unremarkable for acute intracranial process. MRI brain done revealing mild biparietal signal abnormality suspicious for PRES.  [name] recommended starting patient on Keppra BID with repeat MRI in 2 weeks. New onset seizure likely provoked by PRES--if resolved--Ok to take patient off Keppra.  Patient with resultant generalized weakness. CIR recommended due to functional deficits.  Patient transferred to CIR on 04/22/2017 .   Patient currently requires min to mod assist with mobility secondary to muscle weakness, decreased cardiorespiratoy endurance and decreased oxygen support and decreased standing balance and decreased balance strategies.  Prior to hospitalization, patient was independent  with mobility and lived with Family in a House home.  Home access  is  Level entry.  Patient will benefit from skilled PT intervention to maximize safe functional mobility, minimize fall risk and decrease caregiver burden for planned discharge home with 24 hour supervision.  Anticipate patient will HHPT vs OPPT at discharge.  PT - End of Session Activity Tolerance: Decreased this session Endurance Deficit: Yes Endurance Deficit Description: Pt fluctuating throughout day with tolerance to room air requiring supplemental O2. This PM session during evaluation, able to complete session after trial on room air with sats ranging from 92-97% with activity PT Assessment Rehab Potential (ACUTE/IP ONLY): Good PT Barriers to Discharge: New oxygen PT Barriers to Discharge Comments: goal for O2 to be weaned prior to d/c PT Patient demonstrates impairments in the following area(s): Balance;Endurance;Motor;Pain;Skin Integrity PT Transfers Functional Problem(s): Bed Mobility;Bed to Chair;Car;Furniture PT Locomotion Functional Problem(s): Ambulation;Stairs PT Plan PT Intensity: Minimum of 1-2 x/day ,45 to 90 minutes PT Frequency: 5 out of 7 days PT Duration Estimated Length of Stay: 7-10 days PT Treatment/Interventions: Ambulation/gait training;Balance/vestibular training;Community reintegration;Discharge planning;Disease management/prevention;DME/adaptive equipment instruction;Functional mobility training;Neuromuscular re-education;Pain management;Patient/family education;Psychosocial support;Skin care/wound management;Stair training;Therapeutic Activities;Therapeutic Exercise;UE/LE Strength taining/ROM;UE/LE Coordination activities;Wheelchair propulsion/positioning PT Transfers Anticipated Outcome(s): mod I  PT Locomotion Anticipated Outcome(s): mod I household mobility; supervision community or longer distance PT Recommendation Follow Up Recommendations: Home health PT;Outpatient PT Patient destination: Home Equipment Recommended: To be determined Equipment Details: RW  or rollator likley at d/c for energy conservation/balance  Skilled Therapeutic Intervention Evaluation completed (see details above and below) with education on PT POC and goals and individual treatment initiated with focus on functional bed mobility in ADL apartment to simulate home environment with overall supervision, functional transfers throughout session with and without AD to challenge balance requiring overall min to mod assist (without AD) and verbal cues for hand placement when using RW, simulated car transfer with mod assist overall needed for weightshifting and balance with cues for safe technique, gait training without AD (mod assist for balance and facilitation of weightshifting and limited gait distance due to endurance; min guard with RW > 150' with assist for balance and cues for upright posture and to manage O2 tank), stair negotiation with rails for community mobility and general strengthening with min assist overall, and seated LE therex for functional strengthening with 2.5# ankle weight including LAQ, marches, and heel/toe raises x 10 reps each BLE. Initially pt with 2 L O2 via [place], but trialled weaning to room air during session (sats with O2 > 96-99%) and able to tolerate with sats >92% throughout with activity. Pt with SOB, but cues for pursed lip breathing and sats remained WFL. (RN made aware).   PT Evaluation Precautions/Restrictions Precautions Precautions: Fall;Other (comment) Precaution Comments: seixure Pain Unrated; reports pain and discomfort in abdomen/groin especially during toileting but also during mobility. No interventions needed. Home Living/Prior Functioning Home Living Living Arrangements: Other relatives Available Help at Discharge: Family;Available 24 hours/day Type of Home: House Home Access: Level entry Home Layout: One level(except 1 step into 2 areas of house) Bathroom Shower/Tub: Chiropodist: Standard  Lives With: Family Prior  Function Level of Independence: Independent with basic ADLs;Independent with homemaking with ambulation;Independent with gait  Able to Take Stairs?: Yes Driving: No Vocation: Full time employment Vision/Perception  Perception Perception: Within Functional Limits Praxis Praxis: Intact  Cognition Overall Cognitive Status: Within Functional Limits for tasks assessed Arousal/Alertness: Awake/alert Orientation Level: Oriented X4 Memory: Appears intact Sensation Sensation Light Touch: Appears Intact Proprioception: Appears Intact Coordination Gross Motor Movements are Fluid and Coordinated: Yes Fine Motor Movements are Fluid and Coordinated: Yes Motor  Motor Motor: Other (comment) Motor - Skilled Clinical Observations: generalized debility  Mobility Bed Mobility Bed Mobility: Supine to Sit;Sit to Supine Supine to Sit: 5: Supervision Sit to Supine: 5: Supervision Transfers Transfers: Yes Sit to Stand: 4: Min assist;With armrests Stand Pivot Transfers: 4: Min assist;3: Mod assist  Trunk/Postural Assessment  Cervical Assessment Cervical Assessment: Within Functional Limits Thoracic Assessment Thoracic Assessment: Exceptions to WFL(kyphotic posture) Lumbar Assessment Lumbar Assessment: Within Functional Limits Postural Control Postural Control: Within Functional Limits  Balance Balance Balance Assessed: Yes Static Sitting Balance Static Sitting - Level of Assistance: 6: Modified independent (Device/Increase time) Dynamic Sitting Balance Dynamic Sitting - Level of Assistance: 5: Stand by assistance Static Standing Balance Static Standing - Level of Assistance: 4: Min assist Dynamic Standing Balance Dynamic Standing - Level of Assistance: 4: Min assist;3: Mod assist Extremity Assessment  RUE Assessment RUE Assessment: Within Functional Limits LUE Assessment LUE Assessment: Within Functional Limits RLE Assessment RLE Assessment: Exceptions to [facility] RLE Strength RLE  Overall Strength Comments: grossly 3+ to 4-/5; decreased muscular endurance; proximal weakness greater LLE Assessment LLE Assessment: Exceptions to [facility] LLE Strength LLE Overall Strength Comments: grossly 3+ to 4-/5; decreaed muscular endurance; proximal weakness greater   See Function Navigator for Current Functional Status.   Refer to Care Plan for Long Term Goals  Recommendations for other services: Surveyor, mining  group and Outing/community reintegration  Discharge Criteria: Patient will be discharged from PT if patient refuses treatment 3 consecutive times without medical reason, if treatment goals not met, if there is a change in medical status, if patient makes no progress towards goals or if patient is discharged from hospital.  The above assessment, treatment plan, treatment alternatives and goals were discussed and mutually agreed upon: by patient and by family  Juanna Cao, PT, DPT  04/23/2017, 2:51 PM

## 2017-04-23 NOTE — Progress Notes (Signed)
[facility] PHYSICAL MEDICINE & REHABILITATION     PROGRESS NOTE  Subjective/Complaints:  Patient seen sitting up in bed this morning eating breakfast. Husband at bedside. She states she slept well overnight and notes it is much quieter on this floor.  ROS: +SOB. Denies CP, nausea, vomiting, diarrhea  Objective: Vital Signs: Blood pressure (!) 106/44, pulse 63, temperature 98.7 F (37.1 C), temperature source Oral, resp. rate 16, height 5\' 3"  (1.6 m), weight 63.7 kg (140 lb 6.9 oz), SpO2 97 %. Mr Jeri Cos Wo Contrast  Result Date: 04/22/2017 CLINICAL DATA:  Altered mental status, minimally responsive. Seizure like symptoms. EXAM: MRI HEAD WITHOUT AND WITH CONTRAST TECHNIQUE: Multiplanar, multiecho pulse sequences of the brain and surrounding structures were obtained without and with intravenous contrast. CONTRAST:  38mL MULTIHANCE GADOBENATE DIMEGLUMINE 529 MG/ML IV SOLN COMPARISON:  CT HEAD April 19, 2017 FINDINGS: Mildly motion degraded examination. INTRACRANIAL CONTENTS: No reduced diffusion to suggest acute ischemia or status epilepticus. No susceptibility artifact to suggest hemorrhage. The ventricles and sulci are normal for patient's age. Small area faint bilateral parietal FLAIR T2 hyperintense signal. No midline shift, mass effect or masses. No abnormal extra-axial fluid collections. No extra-axial masses. No abnormal intraparenchymal or extra-axial enhancement. VASCULAR: Normal major intracranial vascular flow voids present at skull base. SKULL AND UPPER CERVICAL SPINE: No abnormal sellar expansion. No suspicious calvarial bone marrow signal. Craniocervical junction maintained. SINUSES/ORBITS: Bilateral mastoid effusions.The included ocular globes and orbital contents are non-suspicious. OTHER: Patient is edentulous. IMPRESSION: 1. Motion degraded examination. 2. 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 3. Otherwise negative MRI of the  head with and without contrast for age. Electronically Signed   By: Elon Alas M.D.   On: 04/22/2017 00:34   Recent Labs    04/22/17 0438 04/23/17 0509  WBC 4.3 4.8  HGB 8.7* 8.0*  HCT 27.9* 26.0*  PLT 158 156   Recent Labs    04/22/17 0438 04/23/17 0509  NA 141 141  K 3.2* 3.6  CL 110 109  GLUCOSE 94 91  BUN 6 9  CREATININE 0.90 0.93  CALCIUM 7.8* 8.2*   CBG (last 3)  Recent Labs    04/22/17 0445 04/22/17 0829 04/22/17 1200  GLUCAP 91 75 154*    Wt Readings from Last 3 Encounters:  04/22/17 63.7 kg (140 lb 6.9 oz)  04/22/17 62 kg (136 lb 11 oz)    Physical Exam:  BP (!) 106/44 (BP Location: Right Arm)   Pulse 63   Temp 98.7 F (37.1 C) (Oral)   Resp 16   Ht 5\' 3"  (1.6 m)   Wt 63.7 kg (140 lb 6.9 oz)   SpO2 97%   BMI 24.88 kg/m  Constitutional: She appears well-developed. She has a sickly appearance. No distress.  HENT: Normocephalic and atraumatic.  Eyes: EOM are normal. No discharge.  Cardiovascular: Irregularly irregular. No JVD. Respiratory: Clear. DOE. + [place] GI: Bowel sounds are normal. She exhibits no distension.  Musculoskeletal: She exhibits no edema or tenderness.  Neurological: She is alert and oriented.  She is able to follow basic 1 and 2 step command without difficulty.  Motor: 4-/5 grossly throughout  Skin: Skin is warm and dry. She is not diaphoretic.  Stage II decub on sacrum with yellow eschar and surrounding erythema, not examined today.   Psychiatric: Her speech is normal and behavior is normal. Her mood appears slightly anxious.   Assessment/Plan: 1. Functional deficits secondary to PRES which require 3+ hours  per day of interdisciplinary therapy in a comprehensive inpatient rehab setting. Physiatrist is providing close team supervision and 24 hour management of active medical problems listed below. Physiatrist and rehab team continue to assess barriers to discharge/monitor patient progress toward functional and medical  goals.  Function:  Bathing Bathing position      Bathing parts      Bathing assist        Upper Body Dressing/Undressing Upper body dressing                    Upper body assist        Lower Body Dressing/Undressing Lower body dressing                                  Lower body assist        Toileting Toileting     Toileting steps completed by helper: Adjust clothing prior to toileting, Adjust clothing after toileting, Performs perineal hygiene    Toileting assist Assist level: Set up/obtain supplies, Touching or steadying assistance (Pt.75%)   Transfers Chair/bed transfer             Locomotion Ambulation           Wheelchair          Cognition Comprehension    Expression    Social Interaction    Problem Solving    Memory      Medical Problem List and Plan: 1.  Deficits with mobility, endurance, self-care secondary to PRES   Begin CIR 2.  RLE DVT : Treated with IVC filter. No anticoagulation-- due to significant bleed on Xarelto.  3. Pain Management: tylenol prn 4. Mood: team to provide ego support. LCSW to follow for evaluation and support.  5. Neuropsych: This patient is not fully capable of making decisions on her own behalf. 6. Skin/Wound Care: Air mattress overlay for sacral decub with MASD. Added santyl with damp to dry dressing. Add vitamins as well as protein supplements to help promote wound healing.  7. Fluids/Electrolytes/Nutrition: Monitor I/Os. Added supplements with meals.  8. Gastric ulcer/GIB with hemorrhagic shock/ABLA:  Has been treated with 6 units PRBC.    Hemoglobin 8.0 on 2/14,? Trending down   Labs ordered for tomorrow 9. Mediastinal mass: Will need to follow up on outpatient basis  10. AKI:  Has resolved. Continue to encourage adequate intake.  11. PRES with new onset seizures: Continue Keppra bid--follow MRI brain in 2 weeks (~2/28) 12. Abdominal pain: Resolving. Monitor stools for any evidence of  recurrent bleeding.  13. Hypokalemia: Has been persistent. Added supplement.    Potassium 3.6 on 2/14   Continue to monitor 14. Hypoxia: Continue nebulizers--encourage use bid.  Wean supplemental oxgen as able.  15. Hypocalcemia: Added CaCO3 for oral supplement.    Calcium 8.2 on 2/14   Continue to monitor   16. Hyperglycemia   CBGs ordered   Will order HbA1c with next set of labs 17. Hypomagnesemia   Supplement initiated on 2/14 18. Hypoalbuminemia   Supplement initiated on 2/14  LOS (Days) 1 A FACE TO FACE EVALUATION WAS PERFORMED  Anab Vivar Lorie Phenix 04/23/2017 7:04 AM

## 2017-04-23 NOTE — Progress Notes (Signed)
Physical Medicine and Rehabilitation Consult   Reason for Consult: Debility due to GIB with multiple medical issues Referring Physician: Dr. Lake Bells   HPI: Kayla Wyatt is a 62 y.o. female with history of HTN who was admitted on 04/02/17 with abdominal pain, hematemesis, melena and ABLA due to GIB. She underwent clipping of gastric ulcer and received multiple units PRBC but continued to have drop in H/H with bloody stool. NM GI scan was negative for bleeding and she underwent colonoscopy by Dr. [name] 1/31 showing diverticulosis but no signs of bleeding. Hospital course significant for R- gastrocnemius DVT, PAF, incidental findings of mediastinal mass as well as brief episode of hematuria. She was cleared to start on Xarelto on 02/01 for treatment of DVT.  On am of 2/2 she developed hematemesis with maroon stools and hypotension requiring fluid bolus as well as 2 units PRBCs. She continued to decline requiring intubation, pressors as well as reversal of anticoagulation with Kcentra on 02/3. She underwent UGI revealing larg clot with fresh blood in stomach and underwent mesenteric arteriogram with percutaneous coil embolization fo distal tributary of left gastric artery and placement of IVC filter by interventional radiology.   [name] felt no further cardiac work up indicated and did not recommend initiating anticoagulation in short term. To follow up with [name] after PET scan and routine biopsy after medically stable.  She continued to have bleeding and underwent visceral angiogram with embolization of inferior division of splenic artery and associated gastric arteries on 2/4. No surgical intervention needed per [name] with recommendations to continue monitoring H/H as well as for recurrence of hematochezia. She tolerated extubation on 2/6 and therapy evaluation done today showing debility. CIR recommended due to functional deficits.   Patient denies any weakness of the legs prior to  her hospital admission.  Patient feels like her legs are swollen but also that her hands are swollen as well.  Her last PT visit 04/16/2017 worked on only bed mobility.  Prior PT visit was at 04/06/2017, this was before her respiratory failure and recurrent GI bleed, she was ambulating 120 feet with min guard assist  Review of Systems  Constitutional: Negative for chills and fever.  HENT: Negative for hearing loss and tinnitus.   Eyes: Negative for blurred vision and double vision.  Respiratory: Negative for cough, shortness of breath and wheezing.   Cardiovascular: Negative for chest pain and palpitations.  Gastrointestinal: Positive for blood in stool (this am per nurse notes). Negative for abdominal pain and heartburn.  Musculoskeletal: Positive for back pain and myalgias.  Skin: Negative for itching and rash.  Neurological: Positive for weakness. Negative for dizziness and headaches.  Psychiatric/Behavioral: The patient is not nervous/anxious and does not have insomnia.           Past Medical History:  Diagnosis Date  . Acute blood loss anemia 04/02/2017  . Gastrointestinal hemorrhage with melena   . Hypercholesteremia   . Hypertension   . Persistent atrial fibrillation with rapid ventricular response ([place]) 04/07/2017  . Vitamin D deficiency          Past Surgical History:  Procedure Laterality Date  . COLONOSCOPY Left 04/09/2017   Procedure: COLONOSCOPY;  Surgeon: Carol Ada, MD;  Location: [place];  Service: Endoscopy;  Laterality: Left;  . ESOPHAGOGASTRODUODENOSCOPY N/A 04/03/2017   Procedure: ESOPHAGOGASTRODUODENOSCOPY (EGD);  Surgeon: Carol Ada, MD;  Location: [place];  Service: Endoscopy;  Laterality: N/A;  . ESOPHAGOGASTRODUODENOSCOPY (EGD) WITH PROPOFOL N/A 04/12/2017   Procedure: ESOPHAGOGASTRODUODENOSCOPY (  EGD) WITH PROPOFOL;  Surgeon: Milus Banister, MD;  Location: [facility];  Service: Endoscopy;  Laterality: N/A;  . IR ANGIOGRAM SELECTIVE  EACH ADDITIONAL VESSEL  04/12/2017  . IR ANGIOGRAM SELECTIVE EACH ADDITIONAL VESSEL  04/13/2017  . IR ANGIOGRAM SELECTIVE EACH ADDITIONAL VESSEL  04/13/2017  . IR ANGIOGRAM SELECTIVE EACH ADDITIONAL VESSEL  04/13/2017  . IR ANGIOGRAM VISCERAL SELECTIVE  04/12/2017  . IR ANGIOGRAM VISCERAL SELECTIVE  04/13/2017  . IR EMBO ART  VEN HEMORR LYMPH EXTRAV  INC GUIDE ROADMAPPING  04/12/2017  . IR EMBO ART  VEN HEMORR LYMPH EXTRAV  INC GUIDE ROADMAPPING  04/13/2017  . IR FLUORO GUIDE CV LINE RIGHT  04/13/2017  . IR IVC FILTER PLMT / S&I /IMG GUID/MOD SED  04/12/2017  . IR US GUIDE VASC ACCESS RIGHT  04/12/2017  . IR US GUIDE VASC ACCESS RIGHT  04/13/2017  . IR US GUIDE VASC ACCESS RIGHT  04/13/2017  . LEG SURGERY  1974   Blood Clot Removal          Family History  Problem Relation Age of Onset  . Heart disease Father      Social History:  Lives with brother. She was working full time and was independent without AD. She reports that she has been smoking 1/2 PPD for 40 years.  She has never used smokeless tobacco. She reports that she does not drink alcohol or use drugs.    Allergies: No Known Allergies          Medications Prior to Admission  Medication Sig Dispense Refill  . aspirin 81 MG tablet Take 81 mg by mouth daily.    . cholecalciferol (VITAMIN D) 1000 units tablet Take 1,000 Units by mouth 2 (two) times daily.    [name] lisinopril-hydrochlorothiazide (PRINZIDE,ZESTORETIC) 20-12.5 MG tablet Take 1 tablet by mouth daily.    . simvastatin (ZOCOR) 20 MG tablet Take 1 tablet by mouth daily.  1    Home: Home Living Family/patient expects to be discharged to:: Private residence Living Arrangements: Other relatives(brother) Available Help at Discharge: Family, Available PRN/intermittently Type of Home: House Home Access: Level entry Home Layout: One [place]: None  Functional History: Prior Function Level of Independence: Independent Comments: works Functional  Status:  Mobility: Bed Mobility Overal bed mobility: Needs Assistance Bed Mobility: Rolling Rolling: +2 for physical assistance, Mod assist Supine to sit: Min assist General bed mobility comments: pt only able to tolerate rolling this session for bed pan placement; pt with large, liquid BM that was dark with red as well (pt's RN was notified); pt very fatigued after rolling bilaterally for pericare and repositioning Transfers Overall transfer level: Needs assistance Equipment used: Rolling walker (2 wheeled) Transfers: Sit to/from Stand Sit to Stand: Min guard General transfer comment: Cues for hand placement to and from seated surface.   Ambulation/Gait Ambulation/Gait assistance: Min guard Ambulation Distance (Feet): 120 Feet Assistive device: Rolling walker (2 wheeled) Gait Pattern/deviations: Step-through pattern, Decreased stance time - right, Decreased stride length, Antalgic General Gait Details: Slightly guarded, antalgic gait due to RLE soreness. Used walker to decrease discomfort of RLE. Amb on 2L but poor waveform so unable to obtain accurate SPo2 reading.   Gait velocity: decr Gait velocity interpretation: Below normal speed for age/gender  ADL:  Cognition: Cognition Overall Cognitive Status: Within Functional Limits for tasks assessed Orientation Level: Oriented X4 Cognition Arousal/Alertness: Awake/alert Behavior During Therapy: WFL for tasks assessed/performed Overall Cognitive Status: Within Functional Limits for tasks assessed  Blood pressure (!) 161/145, pulse (!) 50, temperature 99.1 F (37.3 C), temperature source Oral, resp. rate (!) 21, height 5\' 6"  (1.676 m), weight 85.4 kg (188 lb 4.4 oz), SpO2 100 %. Physical Exam  Nursing note and vitals reviewed. Constitutional: She is oriented to person, place, and time. She appears well-developed and well-nourished. She has a sickly appearance. No distress. Nasal cannula in place.  HENT:  Head: Normocephalic  and atraumatic.  Mouth/Throat: Oropharynx is clear and moist.  Eyes: Conjunctivae and EOM are normal. Pupils are equal, round, and reactive to light.  Cardiovascular: Normal rate and regular rhythm.  No murmur heard. Respiratory: Effort normal. She has decreased breath sounds. She has no wheezes.  GI: Soft. Bowel sounds are normal. She exhibits no distension.  Musculoskeletal: She exhibits edema.  Dependent edema bilateral forearms and BLE. Had difficulty activating LLE.   Neurological: She is alert and oriented to person, place, and time.  Dysphonia noted. Able to follow simple motor commands and answer biographical questions without difficulty.   Skin: Skin is warm and dry. She is not diaphoretic.  Motor strength is 3/5 in bilateral deltoid 4 bilateral biceps triceps grip 2- bilateral hip flexors 3 bilateral knee extensors 3 bilateral ankle dorsiflexors Sensation mildly diminished in the right foot compared to the left foot.  Intact to light touch in both hands. Patient has bilateral dorsum of the hand edema as well as forearm edema as well as pretibial edema bilaterally  Assessment/Plan: Diagnosis: Critical illness myopathy with lower extremity greater than upper extremity proximal weakness. 1. Does the need for close, 24 hr/day medical supervision in concert with the patient's rehab needs make it unreasonable for this patient to be served in a less intensive setting? Yes 2. Co-Morbidities requiring supervision/potential complications: History of GI bleed, DVT, atrial fibrillation with RVR, hypokalemia, anemia 3. Due to bladder management, bowel management, safety, skin/wound care, disease management, medication administration, pain management and patient education, does the patient require 24 hr/day rehab nursing? Yes 4. Does the patient require coordinated care of a physician, rehab nurse, PT (1-2 hrs/day, 5 days/week), OT (1-2 hrs/day, 5 days/week) and SLP (.5-1 hrs/day, 5 days/week) to  address physical and functional deficits in the context of the above medical diagnosis(es)? Yes Addressing deficits in the following areas: balance, endurance, locomotion, strength, transferring, bowel/bladder control, bathing, dressing, feeding, grooming, toileting, speech, swallowing and Dysphonia 5. Can the patient actively participate in an intensive therapy program of at least 3 hrs of therapy per day at least 5 days per week? No 6. The potential for patient to make measurable gains while on inpatient rehab is Good once she gains some endurance 7. Anticipated functional outcomes upon discharge from inpatient rehab are supervision and min assist  with PT, supervision and min assist with OT, modified independent with SLP. 8. Estimated rehab length of stay to reach the above functional goals is: Likely 2-3 weeks, pending PT, OT re-eval's 9. Anticipated D/C setting: Home 10. Anticipated post D/C treatments: [place] therapy 11. Overall Rehab/Functional Prognosis: good  RECOMMENDATIONS: This patient's condition is appropriate for continued rehabilitative care in the following setting: CIR once able to tolerate therapy Patient has agreed to participate in recommended program. Yes Note that insurance prior authorization may be required for reimbursement for recommended care.  Comment: Appears to have some fluid overload , diuresis may aid with mobility  Charlett Blake M.D. [facility] [facility] FAAPM&R (Sports Med, Neuromuscular Med) Diplomate Am Board of [place],  PA-C 04/16/2017          Revision History                        Routing History

## 2017-04-23 NOTE — Progress Notes (Signed)
Patient information reviewed and entered into eRehab system by Rosalba Totty, RN, CRRN, PPS Coordinator.  Information including medical coding and functional independence measure will be reviewed and updated through discharge.    

## 2017-04-23 NOTE — Progress Notes (Signed)
PMR Admission Coordinator Pre-Admission Assessment  Patient: Kayla Wyatt is an 62 y.o., female MRN: [id number] DOB: 11/25/1955 Height: 5\' 3"  (160 cm) Weight: 62 kg (136 lb 11 oz)                                                                                                                                                  Insurance Information HMO:     PPO:      PCP:      IPA:      80/20:      OTHER: Blue Option with [employer] PRIMARY: BCBS      Policy#: [id number]      Subscriber: Self CM Name: Celso Amy      Phone#: [phone]     Fax#: [phone] Pre-Cert#: [id number]   8/89/16-9/45/03 with updates due 05/05/17   Employer: [employer] Benefits:  Phone #: [phone]     Name: Verified online at [email or web address] Eff. 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  History of Present Illness: Kayla Wyatt a 62 y.o.femalewith history of HTN who was admitted on 11/24/169 with abdominal pain, hematemesis, melena and ABLA due to GIB. She underwent clipping of gastric ulcer and received multiple units PRBC but continued to have drop in H/H with bloody stool. NM GI scan was negative for bleeding and she underwent colonoscopy by Dr. [name] 1/31 showing diverticulosis but no signs of bleeding. Hospital  course significant for R- gastrocnemius DVT, PAF, incidental findings of mediastinal mass as well as brief episode of hematuria. She was cleared to start on Xarelto on 02/01 for treatment of DVT. On 2/2 amshe developed hematemesis with maroon stools and hypotension requiring fluid bolus as well as 2 units PRBCs. She continued to decline requiring intubation, pressors as well as reversal of anticoagulation with Kcentra on 02/3. She underwent UGI revealing largeclot with fresh blood in stomach and underwent mesenteric arteriogram with percutaneous coil embolization fo distal tributary of left  gastric artery and placement of IVC filter by interventional radiology.   A fib with RVR felt to be due to hemorrhagic shock and she converted to NSR.[name] felt no further cardiac work up indicated and did not recommend initiating anticoagulation in short term. To follow up with [name] after PET scan and routine biopsy after medically stable. She continued to have bleeding and underwent visceral angiogram with embolization of inferior division of splenic artery and associated gastric arteries on 2/4. No surgical intervention needed per [name] with recommendations to continue monitoring H/H as well as for recurrence of hematochezia. She tolerated extubation on 2/6 andrespiratory status improving.   On 2/11, she has episode of unresponsiveness with jerking of bilateral limbs that lasted about 3 minutes. She had amnesia of events but was back to baseline. EEG revealed"focal slowing over the right temporo-occipital regionandoccasional epileptiform discharges over the right occipital region." Head CT was negative for acute changes but MRI brain done revealing mild biparietal signal abnormality suspicious for PRES. [name] recommended starting patient on Keppra BID with repeat MRI in 2 weeks. New onset seizure likely provoked by PRES--if resolved--Ok to take patient off Keppra. Patient with  resultant critical illness myopathy LE >UE and CIR recommended due to functional deficits and patient admitted 04/22/17.    Past Medical History      Past Medical History:  Diagnosis Date  . Acute blood loss anemia 04/02/2017  . Gastrointestinal hemorrhage with melena   . Hypercholesteremia   . Hypertension   . Persistent atrial fibrillation with rapid ventricular response ([place]) 04/07/2017  . Vitamin D deficiency     Family History  family history includes Heart disease in her father.  Prior Rehab/Hospitalizations:  Has the patient had major surgery during 100 days prior to admission? No  Current Medications   Current Facility-Administered Medications:  .  0.9 %  sodium chloride infusion, 250 mL, Intravenous, PRN, Omar Person, NP .  acetaminophen (TYLENOL) tablet 650 mg, 650 mg, Oral, Q6H PRN, 650 mg at 04/21/17 2105 **OR** acetaminophen (TYLENOL) suppository 650 mg, 650 mg, Rectal, Q6H PRN, Elwin Mocha, MD .  arformoterol [facility]) nebulizer solution 15 mcg, 15 mcg, Nebulization, BID, Diallo, Abdoulaye, MD, 15 mcg at 04/22/17 0814 .  budesonide (PULMICORT) nebulizer solution 0.5 mg, 0.5 mg, Nebulization, BID, Diallo, Abdoulaye, MD, 0.5 mg at 04/22/17 0815 .  Chlorhexidine Gluconate Cloth 2 % PADS 6 each, 6 each, Topical, Daily, Juanito Doom, MD, 6 each at 04/22/17 [phone] .  feeding supplement (BOOST / RESOURCE BREEZE) liquid 1 Container, 1 Container, Oral, TID BM, Juanito Doom, MD, 1 Container at 04/22/17 [phone] .  Gerhardt's butt cream, , Topical, TID, Aline August, MD, 1 application at 41/28/78 0906 .  gi cocktail (Maalox,Lidocaine,Donnatal), 30 mL, Oral, TID PRN, Alma Friendly, MD, 30 mL at 04/18/17 1248 .  insulin aspart (novoLOG) injection 2-6 Units, 2-6 Units, Subcutaneous, Q4H, Omar Person, NP, 4 Units at 04/22/17 1241 .  levETIRAcetam (KEPPRA) tablet 500 mg, 500 mg, Oral, BID, Arrien, Jimmy Picket, MD .  lidocaine (PF)  (XYLOCAINE) 1 % injection, , , PRN, Jacqulynn Cadet, MD, 15 mL at 04/13/17 1738 .  menthol-cetylpyridinium (CEPACOL) lozenge 3 mg, 1 lozenge, Oral, PRN, Alma Friendly, MD, 3 mg at 04/20/17 1251 .  nicotine (NICODERM CQ - dosed in mg/24 hours) patch 14 mg, 14 mg, Transdermal, Daily PRN, Elwin Mocha, MD .  ondansetron [facility]) tablet 4 mg, 4 mg, Oral, Q6H PRN **OR** ondansetron (  ZOFRAN) injection 4 mg, 4 mg, Intravenous, Q6H PRN, Elwin Mocha, MD .  pantoprazole (PROTONIX) EC tablet 40 mg, 40 mg, Oral, BID, Riccio, Angela C, DO, 40 mg at 04/22/17 0914 .  sodium chloride flush (NS) 0.9 % injection 10-40 mL, 10-40 mL, Intracatheter, PRN, Simonne Maffucci B, MD, 20 mL at 04/21/17 0443 .  sodium chloride flush (NS) 0.9 % injection 3 mL, 3 mL, Intravenous, Q12H, Elwin Mocha, MD, 3 mL at 04/22/17 0907 .  white petrolatum (VASELINE) gel, , Topical, PRN, Alma Friendly, MD, 1 application at 28/41/32 1251  Patients Current Diet: Seizure precautions DIET SOFT Room service appropriate? Yes; Fluid consistency: Thin Diet - low sodium heart healthy  Precautions / Restrictions Precautions Precautions: Fall Restrictions Weight Bearing Restrictions: No   Has the patient had 2 or more falls or a fall with injury in the past year?No  Prior Activity Level Community (5-7x/wk): Prior to admission patient was fully independnet and active. 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    Home Assistive Devices / Equipment Home Assistive Devices/Equipment: None Home Equipment: None  Prior Device Use: Indicate devices/aids used by the patient prior to current illness, exacerbation or injury? None of the above  Prior Functional Level Prior Function Level of Independence: Independent Comments: works  Self Care: Did the patient need help bathing, dressing, using the toilet or eating? Independent  Indoor Mobility: Did the patient need assistance with  walking from room to room (with or without device)? Independent  Stairs: Did the patient need assistance with internal or external stairs (with or without device)? Independent  Functional Cognition: Did the patient need help planning regular tasks such as shopping or remembering to take medications? Independent  Current Functional Level Cognition  Overall Cognitive Status: Within Functional Limits for tasks assessed Orientation Level: Oriented X4 General Comments: anxious about movement and requiring increased motivation for participation    Extremity Assessment (includes Sensation/Coordination)  Upper Extremity Assessment: Overall WFL for tasks assessed, Generalized weakness  Lower Extremity Assessment: Defer to PT evaluation RLE Deficits / Details: Slightly limited by soreness    ADLs  Overall ADL's : Needs assistance/impaired Eating/Feeding: Set up, Sitting Grooming: Min guard, Sitting, Minimal assistance Upper Body Bathing: Maximal assistance, Sitting Lower Body Bathing: Maximal assistance, +2 for safety/equipment Upper Body Dressing : Maximal assistance, Sitting Lower Body Dressing: Maximal assistance, +2 for safety/equipment, Sit to/from stand Toilet Transfer: Moderate assistance, +2 for safety/equipment, Stand-pivot Toileting- Clothing Manipulation and Hygiene: Maximal assistance, Sit to/from stand, +2 for safety/equipment Tub/ Shower Transfer: Maximal assistance, +2 for safety/equipment, Stand-pivot, Rolling walker General ADL Comments: Pt received in bed with nursing students assisting with bathing. Pt then completed bed mobility, sat EOB aobut 2 minutes then took pivotol steps from EOB>recliner. Pt very unsteady on feet and fearful of falling.     Mobility  Overal bed mobility: Needs Assistance Bed Mobility: Supine to Sit Rolling: Min assist Sidelying to sit: Min guard Supine to sit: Min assist General bed mobility comments: min guard for safety with coming to  EoB, c/o of dizziness wihich quickly dissipated     Transfers  Overall transfer level: Needs assistance Equipment used: Rolling walker (2 wheeled) Transfers: Sit to/from Stand, [employer] Transfers Sit to Stand: Min assist Stand pivot transfers: Min assist, +2 physical assistance General transfer comment: min guard for safety with power up and steadying     Ambulation / Gait / Stairs / Wheelchair Mobility  Ambulation/Gait Ambulation/Gait  assistance: Min guard Ambulation Distance (Feet): 50 Feet Assistive device: Rolling walker (2 wheeled) Gait Pattern/deviations: Step-through pattern, Decreased step length - right, Decreased step length - left, Trunk flexed General Gait Details: hands on min guard for safety, vc for proximity to RW and upright posture, pt fatigued after 50 feet of ambulation and rolled back to room in recliner Gait velocity: slowed Gait velocity interpretation: Below normal speed for age/gender    Posture / Balance Dynamic Sitting Balance Sitting balance - Comments: sat EOB to eat her breakfast x5 mins Balance Overall balance assessment: Needs assistance Sitting-balance support: Feet supported Sitting balance-Leahy Scale: Good Sitting balance - Comments: sat EOB to eat her breakfast x5 mins Standing balance support: Bilateral upper extremity supported Standing balance-Leahy Scale: Fair Standing balance comment: able to stand statically and adjust gown without UE support    Special needs/care consideration BiPAP/CPAP: No CPM: No Continuous Drip IV: No Dialysis: No         Life Vest: No Oxygen: None PTA,now on 1.5L nasal cannula  Special Bed: No Trach Size: No Wound Vac (area): No       Skin: Dry and cracked buttocks, left heel; Moisture associated skin damage to peri, groin, and buttocks; Ecchymosis to bilateral upper extremities                              Bowel mgmt: Continent, last BM 04/20/17 Bladder mgmt: Continent  Diabetic mgmt: No      Previous Home Environment Living Arrangements: Other relatives Available Help at Discharge: Family, Available PRN/intermittently Type of Home: House Home Layout: One level(except 1 step into some areas) Home Access: Level entry [place]: No  Discharge Living Setting Plans for Discharge Living Setting: Patient's home, Lives with (comment)(brother ) Type of Home at Discharge: House(moving in May ) Discharge Home Layout: One level, Other (Comment)(1 step into kitchen from dining room and living room ) Discharge Home Access: Level entry Discharge Bathroom Shower/Tub: Tub/shower unit, Door Discharge Bathroom Toilet: Standard Discharge Bathroom Accessibility: Yes How Accessible: Accessible via walker Does the patient have any problems obtaining your medications?: No  Social/Family/Support Systems Patient Roles: Other (Comment)(Sister and aunt) Contact Information: Brother: Berlie Persky [phone] Anticipated Caregiver: Brother  Anticipated Caregiver's Contact Information: see above  Ability/Limitations of Caregiver: None Caregiver Availability: 24/7 Discharge Plan Discussed with Primary Caregiver: Yes Is Caregiver In Agreement with Plan?: Yes Does Caregiver/Family have Issues with Lodging/Transportation while Pt is in Rehab?: No  Goals/Additional Needs Patient/Family Goal for Rehab: PT/OT: Supervision-Min A; SLP: Mod I Expected length of stay: 2-3 weeks  Cultural Considerations: Independent [employer] Dietary Needs: Soft diet  Equipment Needs: TBD Special Service Needs: None Additional Information: Patient and brother moving toward [place] in May. Pt/Family Agrees to Admission and willing to participate: Yes Program Orientation Provided & Reviewed with Pt/Caregiver Including Roles  & Responsibilities: Yes Additional Information Needs: Since new home set-up is unknown team may want to address stair goal Information Needs to be Provided By: Team FYI  Decrease  burden of Care through IP rehab admission: No  Possible need for SNF placement upon discharge: No  Patient Condition: This patient's medical and functional status has changed since the consult dated: 04/16/17 in which the Rehabilitation Physician determined and documented that the patient's condition is appropriate for intensive rehabilitative care in an inpatient rehabilitation facility. See "History of Present Illness" (above) for medical update. Functional changes are: Min A transfers, Min guard 50  feet with rolling walker. Patient's medical and functional status update has been discussed with the Rehabilitation physician and patient remains appropriate for inpatient rehabilitation. Will admit to inpatient rehab today.  Preadmission Screen Completed By:  Gunnar Fusi, 04/22/2017 2:56 PM ______________________________________________________________________   Discussed status with Dr. Posey Pronto on 04/22/17 at 1455 and received telephone approval for admission today.  Admission Coordinator:  Gunnar Fusi, time 1455/Date 04/22/17             Cosigned by: Jamse Arn, MD at 04/22/2017 3:00 PM  Revision History

## 2017-04-23 NOTE — Progress Notes (Signed)
Initial Nutrition Assessment  DOCUMENTATION CODES:   Not applicable  INTERVENTION:  Continue Boost Breeze po TID, each supplement provides 250 kcal and 9 grams of protein.  Continue 30 ml Prostat po BID, each supplement provides 100 kcal and 15 grams of protein.   Encourage adequate PO intake.   NUTRITION DIAGNOSIS:   Increased nutrient needs related to acute illness as evidenced by estimated needs.  GOAL:   Patient will meet greater than or equal to 90% of their needs  MONITOR:   PO intake, Supplement acceptance, Labs, Weight trends, I & O's, Skin  REASON FOR ASSESSMENT:   Malnutrition Screening Tool    ASSESSMENT:   62 y.o. female with history of HTN who was admitted on 04/02/17 with abdominal pain, hematemesis, melena and ABLA due to GIB.  Pt was unavailable during time of visit. RD unable to obtain nutrition history.  Meal completion has been 75-100%. Pt currently has Boost Breeze and Prostat ordered and has been consuming them. RD to continue with current orders.   Unable to complete Nutrition-Focused physical exam at this time. RD to perform physical exam at next visit. Labs and medications reviewed.   Diet Order:  DIET SOFT Room service appropriate? Yes; Fluid consistency: Thin Seizure precautions  EDUCATION NEEDS:   Not appropriate for education at this time  Skin:  Skin Assessment: Reviewed RN Assessment  Last BM:  2/14  Height:   Ht Readings from Last 1 Encounters:  04/22/17 5\' 3"  (1.6 m)    Weight:   Wt Readings from Last 1 Encounters:  04/22/17 140 lb 6.9 oz (63.7 kg)    Ideal Body Weight:  52.27 kg  BMI:  Body mass index is 24.88 kg/m.  Estimated Nutritional Needs:   Kcal:  1600-1800  Protein:  75-85 grams  Fluid:  1.6 - 1.8 L/day    Corrin Parker, MS, RD, LDN Pager # [phone] After hours/ weekend pager # [phone]

## 2017-04-23 NOTE — Evaluation (Addendum)
Speech Language Pathology Assessment and Plan  Patient Details  Name: Kayla Wyatt MRN: [id number] Date of Birth: Apr 28, 1955  Today's Date: 04/23/2017 SLP Individual Time: 1105-1200 SLP Individual Time Calculation (min): 55 min   Problem List:  Patient Active Problem List   Diagnosis Date Noted  . Hypoalbuminemia due to protein-calorie malnutrition ([place])   . Hypomagnesemia   . Hyperglycemia   . Critical illness myopathy 04/22/2017  . PRES (posterior reversible encephalopathy syndrome)   . Acute deep vein thrombosis (DVT) of right lower extremity ([place])   . Pressure injury of sacral region, unstageable ([place])   . Gastrointestinal hemorrhage with hematemesis   . Mediastinal mass   . Hypokalemia   . Hypocalcemia   . Supplemental oxygen dependent   . Encounter for intubation   . Respiratory failure ([place])   . Persistent atrial fibrillation with rapid ventricular response ([place]) 04/07/2017  . Gastrointestinal hemorrhage with melena   . Lower abdominal pain of unknown etiology   . Abnormal computed tomography angiography (CTA) of abdomen and pelvis   . Acute blood loss anemia 04/02/2017   Past Medical History:  Past Medical History:  Diagnosis Date  . Acute blood loss anemia 04/02/2017  . Gastrointestinal hemorrhage with melena   . Hypercholesteremia   . Hypertension   . Persistent atrial fibrillation with rapid ventricular response ([place]) 04/07/2017  . Vitamin D deficiency    Past Surgical History:  Past Surgical History:  Procedure Laterality Date  . COLONOSCOPY Left 04/09/2017   Procedure: COLONOSCOPY;  Surgeon: Carol Ada, MD;  Location: [facility];  Service: Endoscopy;  Laterality: Left;  . ESOPHAGOGASTRODUODENOSCOPY N/A 04/03/2017   Procedure: ESOPHAGOGASTRODUODENOSCOPY (EGD);  Surgeon: Carol Ada, MD;  Location: [facility];  Service: Endoscopy;  Laterality: N/A;  . ESOPHAGOGASTRODUODENOSCOPY (EGD) WITH PROPOFOL N/A 04/12/2017   Procedure: ESOPHAGOGASTRODUODENOSCOPY  (EGD) WITH PROPOFOL;  Surgeon: Milus Banister, MD;  Location[facility] ENDOSCOPY;  Service: Endoscopy;  Laterality: N/A;  . IR ANGIOGRAM SELECTIVE EACH ADDITIONAL VESSEL  04/12/2017  . IR ANGIOGRAM SELECTIVE EACH ADDITIONAL VESSEL  04/13/2017  . IR ANGIOGRAM SELECTIVE EACH ADDITIONAL VESSEL  04/13/2017  . IR ANGIOGRAM SELECTIVE EACH ADDITIONAL VESSEL  04/13/2017  . IR ANGIOGRAM VISCERAL SELECTIVE  04/12/2017  . IR ANGIOGRAM VISCERAL SELECTIVE  04/13/2017  . IR EMBO ART  VEN HEMORR LYMPH EXTRAV  INC GUIDE ROADMAPPING  04/12/2017  . IR EMBO ART  VEN HEMORR LYMPH EXTRAV  INC GUIDE ROADMAPPING  04/13/2017  . IR FLUORO GUIDE CV LINE RIGHT  04/13/2017  . IR IVC FILTER PLMT / S&I /IMG GUID/MOD SED  04/12/2017  . IR US GUIDE VASC ACCESS RIGHT  04/12/2017  . IR US GUIDE VASC ACCESS RIGHT  04/13/2017  . IR US GUIDE VASC ACCESS RIGHT  04/13/2017  . LEG SURGERY  1974   Blood Clot Removal     Assessment / Plan / Recommendation Clinical Impression   Kayla Wyatt a 62 Wyatt history of HTN who was admitted on 04/02/17 with abdominal pain, hematemesis, melena and ABLA due to GIB. History taken from chart review. She underwent clipping of gastric ulcer and received multiple units PRBC but continued to have drop in H/H with bloody stool. NM GI scan was negative for bleeding and she underwent colonoscopy by Dr. [name] 1/31 showing diverticulosis but no signs of bleeding. Hospital course significant for R- gastrocnemius DVT, PAF, incidental findings of mediastinal mass as well as brief episode of hematuria. She was cleared to start on Xarelto on 02/01 for treatment of DVT.  On  2/2 am she developed hematemesis with maroon stools and hypotension requiring fluid bolus as well as 2 units PRBCs. She continued to decline requiring intubation, pressors as well as reversal of anticoagulation with Kcentra on 02/3. She underwent UGI revealing large clot with fresh blood in stomach and underwent mesenteric arteriogram with percutaneous coil  embolization fo distal tributary of left gastric artery and placement of IVC filter by interventional radiology.   A fib with RVR felt to be due to hemorrhagic shock and she converted to NSR. [name] felt no further cardiac work up indicated and did not recommend initiating anticoagulation in short term. To follow up with [name] after PET scan and routine biopsy after medically stable. She continued to have bleeding and underwent visceral angiogram with embolization of inferior division of splenic artery and associated gastric arteries on 2/4. No surgical intervention needed per [name] with recommendations to continue monitoring H/H as well as for recurrence of hematochezia. She tolerated extubation on 2/6 and respiratory status improving.   On 2/11, she has episode of unresponsiveness with jerking of bilateral limbs that lasted about 3 minutes. She had amnesia of events but was back to baseline.  EEG revealed "focal slowing over the right temporo-occipital regionandoccasional epileptiform discharges over the right occipital region". Head CT reviewed, unremarkable for acute intracranial process. MRI brain done revealing mild biparietal signal abnormality suspicious for PRES.  [name] recommended starting patient on Keppra BID with repeat MRI in 2 weeks. New onset seizure likely provoked by PRES--if resolved--Ok to take patient off Keppra.  Patient with resultant generalized weakness. CIR recommended due to functional deficits. Pt admitted to CIR on 04/22/2017 with SLP evaluation completed on 04/23/2017 with the following results:  Pt presents with grossly intact cognitive-linguistic function.  Her speech is fluent and free from dysarthria or word finding impairment.  She reports that her voice is slightly hoarse post extubation but is improving daily and does not impact her intelligibility.  She reports no difficulty swallowing or choking with meals.  She was 100% accurate with mod I for  medication and money management subtests of ALFA standardized cognitive assessment.  Pt and brother both report pt to be back to baseline.  As a result no further ST needs are indicated at this time.      Skilled Therapeutic Interventions          Cognitive-linguistic evaluation completed with results and recommendations reviewed with patient and family see above.  SLP provided education regarding using a pill box when discharged home to facilitate better organization and recall of new medications.  Pt and brother verbalized understanding and were in agreement with recommendations.   All questions were answered to their satisfaction at this time.      SLP Assessment  Patient does not need any further Speech [facility]    Recommendations  Patient destination: Home Follow up Recommendations: None Equipment Recommended: None recommended by SLP           Pain Pain Assessment Pain Assessment: No/denies pain  Prior Functioning Cognitive/Linguistic Baseline: Within functional limits Type of Home: House  Lives With: Family Available Help at Discharge: Family;Available 24 hours/day Vocation: Full time employment  Function:  Eating Eating     Eating Assist Level: No help, No cues           Cognition Comprehension Comprehension assist level: Follows complex conversation/direction with no assist(Simultaneous filing. User may not have seen previous data.)  Expression   Expression  assist level: Expresses complex ideas: With no assist(Simultaneous filing. User may not have seen previous data.)  Social Interaction Social Interaction assist level: Interacts appropriately with others - No medications needed.(Simultaneous filing. User may not have seen previous data.)  Problem Solving Problem solving assist level: Solves basic 90% of the time/requires cueing < 10% of the time(Simultaneous filing. User may not have seen previous data.)  Memory Memory assist level: Complete  Independence: No helper(Simultaneous filing. User may not have seen previous data.)    Recommendations for other services: None   Discharge Criteria: Patient will be discharged from SLP if patient refuses treatment 3 consecutive times without medical reason, if treatment goals not met, if there is a change in medical status, if patient makes no progress towards goals or if patient is discharged from hospital.  The above assessment, treatment plan, treatment alternatives and goals were discussed and mutually agreed upon: by patient  Brook Geraci, Selinda Orion 04/23/2017, 1:15 PM

## 2017-04-23 NOTE — Evaluation (Signed)
Occupational Therapy Assessment and Plan  Patient Details  Name: Kayla Wyatt MRN: [id number] Date of Birth: 1956/02/23  OT Diagnosis: muscle weakness (generalized) Rehab Potential: Rehab Potential (ACUTE ONLY): Excellent ELOS: 7-10 days   Today's Date: 04/23/2017 OT Individual Time: 4503-8882 OT Individual Time Calculation (min): 65 min     Problem List:  Patient Active Problem List   Diagnosis Date Noted  . Hypoalbuminemia due to protein-calorie malnutrition ([place])   . Hypomagnesemia   . Hyperglycemia   . Critical illness myopathy 04/22/2017  . PRES (posterior reversible encephalopathy syndrome)   . Acute deep vein thrombosis (DVT) of right lower extremity ([place])   . Pressure injury of sacral region, unstageable ([place])   . Gastrointestinal hemorrhage with hematemesis   . Mediastinal mass   . Hypokalemia   . Hypocalcemia   . Supplemental oxygen dependent   . Encounter for intubation   . Respiratory failure ([place])   . Persistent atrial fibrillation with rapid ventricular response ([place]) 04/07/2017  . Gastrointestinal hemorrhage with melena   . Lower abdominal pain of unknown etiology   . Abnormal computed tomography angiography (CTA) of abdomen and pelvis   . Acute blood loss anemia 04/02/2017    Past Medical History:  Past Medical History:  Diagnosis Date  . Acute blood loss anemia 04/02/2017  . Gastrointestinal hemorrhage with melena   . Hypercholesteremia   . Hypertension   . Persistent atrial fibrillation with rapid ventricular response ([place]) 04/07/2017  . Vitamin D deficiency    Past Surgical History:  Past Surgical History:  Procedure Laterality Date  . COLONOSCOPY Left 04/09/2017   Procedure: COLONOSCOPY;  Surgeon: Carol Ada, MD;  Location: [facility];  Service: Endoscopy;  Laterality: Left;  . ESOPHAGOGASTRODUODENOSCOPY N/A 04/03/2017   Procedure: ESOPHAGOGASTRODUODENOSCOPY (EGD);  Surgeon: Carol Ada, MD;  Location: [facility];  Service: Endoscopy;   Laterality: N/A;  . ESOPHAGOGASTRODUODENOSCOPY (EGD) WITH PROPOFOL N/A 04/12/2017   Procedure: ESOPHAGOGASTRODUODENOSCOPY (EGD) WITH PROPOFOL;  Surgeon: Milus Banister, MD;  Location[facility] ENDOSCOPY;  Service: Endoscopy;  Laterality: N/A;  . IR ANGIOGRAM SELECTIVE EACH ADDITIONAL VESSEL  04/12/2017  . IR ANGIOGRAM SELECTIVE EACH ADDITIONAL VESSEL  04/13/2017  . IR ANGIOGRAM SELECTIVE EACH ADDITIONAL VESSEL  04/13/2017  . IR ANGIOGRAM SELECTIVE EACH ADDITIONAL VESSEL  04/13/2017  . IR ANGIOGRAM VISCERAL SELECTIVE  04/12/2017  . IR ANGIOGRAM VISCERAL SELECTIVE  04/13/2017  . IR EMBO ART  VEN HEMORR LYMPH EXTRAV  INC GUIDE ROADMAPPING  04/12/2017  . IR EMBO ART  VEN HEMORR LYMPH EXTRAV  INC GUIDE ROADMAPPING  04/13/2017  . IR FLUORO GUIDE CV LINE RIGHT  04/13/2017  . IR IVC FILTER PLMT / S&I /IMG GUID/MOD SED  04/12/2017  . IR US GUIDE VASC ACCESS RIGHT  04/12/2017  . IR US GUIDE VASC ACCESS RIGHT  04/13/2017  . IR US GUIDE VASC ACCESS RIGHT  04/13/2017  . LEG SURGERY  1974   Blood Clot Removal     Assessment & Plan Clinical Impression: .62 y.o. female with history of HTN who was admitted on 04/02/17 with abdominal pain, hematemesis, melena and ABLA due to GIB. History taken from chart review. She underwent clipping of gastric ulcer and received multiple units PRBC but continued to have drop in H/H with bloody stool. NM GI scan was negative for bleeding and she underwent colonoscopy by Dr. [name] 1/31 showing diverticulosis but no signs of bleeding. Hospital course significant for R- gastrocnemius DVT, PAF, incidental findings of mediastinal mass as well as brief episode of  hematuria. She was cleared to start on Xarelto on 02/01 for treatment of DVT.  On  2/2 am she developed hematemesis with maroon stools and hypotension requiring fluid bolus as well as 2 units PRBCs. She continued to decline requiring intubation, pressors as well as reversal of anticoagulation with Kcentra on 02/3. She underwent UGI revealing large clot with  fresh blood in stomach and underwent mesenteric arteriogram with percutaneous coil embolization fo distal tributary of left gastric artery and placement of IVC filter by interventional radiology.    A fib with RVR felt to be due to hemorrhagic shock and she converted to NSR. [name] felt no further cardiac work up indicated and did not recommend initiating anticoagulation in short term. To follow up with [name] after PET scan and routine biopsy after medically stable.  She continued to have bleeding and underwent visceral angiogram with embolization of inferior division of splenic artery and associated gastric arteries on 2/4. No surgical intervention needed per [name] with recommendations to continue monitoring H/H as well as for recurrence of hematochezia. She tolerated extubation on 2/6 and respiratory status improving.    On 2/11, she has episode of unresponsiveness with jerking of bilateral limbs that lasted about 3 minutes. She had amnesia of events but was back to baseline.  EEG revealed "focal slowing over the right temporo-occipital region and occasional epileptiform discharges over the right occipital region". Head CT reviewed, unremarkable for acute intracranial process. MRI brain done revealing mild biparietal signal abnormality suspicious for PRES.  [name] recommended starting patient on Keppra BID with repeat MRI in 2 weeks. New onset seizure likely provoked by PRES--if resolved--Ok to take patient off Keppra.  Patient with resultant generalized weakness. CIR recommended due to functional deficits.  Patient transferred to CIR on 04/22/2017 .    Patient currently requires min with basic self-care skills secondary to muscle weakness, decreased cardiorespiratoy endurance and decreased oxygen support and decreased standing balance.  Prior to hospitalization, patient was fully independent.  Patient will benefit from skilled intervention to increase independence with basic self-care  skills prior to discharge home with care partner.  Anticipate patient will require intermittent supervision and follow up home health.  OT - End of Session Activity Tolerance: Tolerates < 10 min activity with changes in vital signs(attempted room air, but O2 dropped to 82 with standing activity) Endurance Deficit: Yes OT Assessment Rehab Potential (ACUTE ONLY): Excellent OT Patient demonstrates impairments in the following area(s): Balance;Endurance;Motor OT Basic ADL's Functional Problem(s): Bathing;Dressing;Toileting;Grooming OT Advanced ADL's Functional Problem(s): Light Housekeeping OT Transfers Functional Problem(s): Toilet;Tub/Shower OT Additional Impairment(s): None OT Plan OT Intensity: Minimum of 1-2 x/day, 45 to 90 minutes OT Frequency: 5 out of 7 days OT Duration/Estimated Length of Stay: 7-10 days OT Treatment/Interventions: Balance/vestibular training;Discharge planning;DME/adaptive equipment instruction;Functional mobility training;Patient/family education;Psychosocial support;Self Care/advanced ADL retraining;Therapeutic Exercise;UE/LE Strength taining/ROM;Therapeutic Activities OT Self Feeding Anticipated Outcome(s): no goal OT Basic Self-Care Anticipated Outcome(s): mod I OT Toileting Anticipated Outcome(s): mod I OT Bathroom Transfers Anticipated Outcome(s): mod I OT Recommendation Patient destination: Home Follow Up Recommendations: Home health OT Equipment Recommended: Tub/shower bench   Skilled Therapeutic Intervention Pt seen for initial evaluation and ADL training with a focus on activity tolerance and balance. Explained purpose of OT and POC to pt.  Pt alert, attentive and participated well.  No anxiety noted.  Pt had just received breathing treatment and agreeable to trying room air. In sitting at rest, her O2 sats 97.  When pt transferred and completed toileting  her O2 decreased to the low 80s, so maintained 2L of O2 during the session.  She only needed steadying  A in standing and completing stand pivot to w/c.  Getting off the low toilet she needed mod A to stand but then completed pivot with min A.  Pt was able to complete bathing and dressing with steadying A.  She needed a few short rest breaks, but otherwise she tolerated therapy well. Discussed goals and LOC.  Pt resting in w/c with brother in the room.   OT Evaluation Precautions/Restrictions  Precautions Precautions: Fall   Vital Signs Oxygen Therapy O2 Device: Nasal Cannula O2 Flow Rate (L/min): 2 L/min Pain Pain Assessment Pain Assessment: No/denies pain Home Living/Prior Functioning Home Living Family/patient expects to be discharged to:: Private residence Living Arrangements: Other relatives Available Help at Discharge: Family, Available PRN/intermittently Type of Home: House Home Access: Level entry Home Layout: One level Bathroom Shower/Tub: Chiropodist: Standard  Lives With: Family(brother) Prior Function Level of Independence: Independent with basic ADLs, Independent with homemaking with ambulation, Independent with gait  Able to Take Stairs?: Yes Driving: No Vocation: Full time employment ADL ADL ADL Comments: refer to functional navigator Vision Baseline Vision/History: Wears glasses Wears Glasses: At all times Patient Visual Report: No change from baseline Vision Assessment?: No apparent visual deficits Perception  Perception: Within Functional Limits Praxis Praxis: Intact Cognition Overall Cognitive Status: Within Functional Limits for tasks assessed Arousal/Alertness: Awake/alert Orientation Level: Person;Place;Situation Person: Oriented Place: Oriented Situation: Oriented Year: 2019 Month: February Day of Week: Correct Memory: Appears intact Immediate Memory Recall: Sock;Blue;Bed Memory Recall: Sock;Blue;Bed Memory Recall Sock: Without Cue Memory Recall Blue: Without Cue Memory Recall Bed: Without  Cue Sensation Sensation Light Touch: Appears Intact Stereognosis: Appears Intact Hot/Cold: Appears Intact Proprioception: Appears Intact Coordination Gross Motor Movements are Fluid and Coordinated: Yes Fine Motor Movements are Fluid and Coordinated: Yes Motor  Motor Motor - Skilled Clinical Observations: LE weakness Mobility    min A stand pivot Trunk/Postural Assessment  Cervical Assessment Cervical Assessment: Within Functional Limits Thoracic Assessment Thoracic Assessment: Within Functional Limits Lumbar Assessment Lumbar Assessment: Within Functional Limits Postural Control Postural Control: Within Functional Limits  Balance Dynamic Sitting Balance Dynamic Sitting - Level of Assistance: 5: Stand by assistance Static Standing Balance Static Standing - Level of Assistance: 5: Stand by assistance Dynamic Standing Balance Dynamic Standing - Level of Assistance: 3: Mod assist Extremity/Trunk Assessment RUE Assessment RUE Assessment: Within Functional Limits LUE Assessment LUE Assessment: Within Functional Limits   See Function Navigator for Current Functional Status.   Refer to Care Plan for Long Term Goals  Recommendations for other services: None    Discharge Criteria: Patient will be discharged from OT if patient refuses treatment 3 consecutive times without medical reason, if treatment goals not met, if there is a change in medical status, if patient makes no progress towards goals or if patient is discharged from hospital.  The above assessment, treatment plan, treatment alternatives and goals were discussed and mutually agreed upon: by patient and by family  [place] 04/23/2017, 12:30 PM

## 2017-04-23 NOTE — Progress Notes (Signed)
Social Work  Social Work Assessment and Plan  Patient Details  Name: Kayla Wyatt MRN: [id number] Date of Birth: Mar 26, 1955  Today's Date: 04/23/2017  Problem List:  Patient Active Problem List   Diagnosis Date Noted  . Hypoalbuminemia due to protein-calorie malnutrition ([place])   . Hypomagnesemia   . Hyperglycemia   . Critical illness myopathy 04/22/2017  . PRES (posterior reversible encephalopathy syndrome)   . Acute deep vein thrombosis (DVT) of right lower extremity ([place])   . Pressure injury of sacral region, unstageable ([place])   . Gastrointestinal hemorrhage with hematemesis   . Mediastinal mass   . Hypokalemia   . Hypocalcemia   . Supplemental oxygen dependent   . Encounter for intubation   . Respiratory failure ([place])   . Persistent atrial fibrillation with rapid ventricular response ([place]) 04/07/2017  . Gastrointestinal hemorrhage with melena   . Lower abdominal pain of unknown etiology   . Abnormal computed tomography angiography (CTA) of abdomen and pelvis   . Acute blood loss anemia 04/02/2017   Past Medical History:  Past Medical History:  Diagnosis Date  . Acute blood loss anemia 04/02/2017  . Gastrointestinal hemorrhage with melena   . Hypercholesteremia   . Hypertension   . Persistent atrial fibrillation with rapid ventricular response ([place]) 04/07/2017  . Vitamin D deficiency    Past Surgical History:  Past Surgical History:  Procedure Laterality Date  . COLONOSCOPY Left 04/09/2017   Procedure: COLONOSCOPY;  Surgeon: Carol Ada, MD;  Location: [place];  Service: Endoscopy;  Laterality: Left;  . ESOPHAGOGASTRODUODENOSCOPY N/A 04/03/2017   Procedure: ESOPHAGOGASTRODUODENOSCOPY (EGD);  Surgeon: Carol Ada, MD;  Location: [place];  Service: Endoscopy;  Laterality: N/A;  . ESOPHAGOGASTRODUODENOSCOPY (EGD) WITH PROPOFOL N/A 04/12/2017   Procedure: ESOPHAGOGASTRODUODENOSCOPY (EGD) WITH PROPOFOL;  Surgeon: Milus Banister, MD;  Location: [facility] ENDOSCOPY;   Service: Endoscopy;  Laterality: N/A;  . IR ANGIOGRAM SELECTIVE EACH ADDITIONAL VESSEL  04/12/2017  . IR ANGIOGRAM SELECTIVE EACH ADDITIONAL VESSEL  04/13/2017  . IR ANGIOGRAM SELECTIVE EACH ADDITIONAL VESSEL  04/13/2017  . IR ANGIOGRAM SELECTIVE EACH ADDITIONAL VESSEL  04/13/2017  . IR ANGIOGRAM VISCERAL SELECTIVE  04/12/2017  . IR ANGIOGRAM VISCERAL SELECTIVE  04/13/2017  . IR EMBO ART  VEN HEMORR LYMPH EXTRAV  INC GUIDE ROADMAPPING  04/12/2017  . IR EMBO ART  VEN HEMORR LYMPH EXTRAV  INC GUIDE ROADMAPPING  04/13/2017  . IR FLUORO GUIDE CV LINE RIGHT  04/13/2017  . IR IVC FILTER PLMT / S&I /IMG GUID/MOD SED  04/12/2017  . IR US GUIDE VASC ACCESS RIGHT  04/12/2017  . IR US GUIDE VASC ACCESS RIGHT  04/13/2017  . IR US GUIDE VASC ACCESS RIGHT  04/13/2017  . LEG SURGERY  1974   Blood Clot Removal    Social History:  reports that she has been smoking.  she has never used smokeless tobacco. She reports that she does not drink alcohol or use drugs.  Family / Support Systems Marital Status: Single Patient Roles: Other (Comment)(sibling and employee) Other Supports: Egbert Garibaldi [phone] Anticipated Caregiver: Brother Ability/Limitations of Caregiver: None he is retired and can Veterinary surgeon Availability: 24/7 Family Dynamics: Close with brother whom she lives with and her other siblings. She has a few friends who are supportive and will visit her. Her brother-Kenneth is her main support and plans to be here daily  Social History Preferred language: Unknown Religion:  Cultural Background: No issues Education: High School Read: Yes Write: Yes Employment Status: Employed Name of Employer: [employer] Return to Work Plans: Would like to return when recovered from this illness Legal Hisotry/Current Legal Issues: No issues Guardian/Conservator: None-according to MD pt is not fully capable of making her own decisions at this time.  Will look toward her brother since he is next of kin to pt. He  plans to be here daily to provide support to pt   Abuse/Neglect Abuse/Neglect Assessment Can Be Completed: Yes Physical Abuse: Denies Verbal Abuse: Denies Sexual Abuse: Denies Exploitation of patient/patient's resources: Denies Self-Neglect: Denies  Emotional Status Pt's affect, behavior adn adjustment status: Pt is motivated to do well here and glad to be here. She wants to regain her strength and get back to her independent level. She is somewhat nervous since she has not been out of bed since she became ill. She knoes she has a lot of work to do. Recent Psychosocial Issues: was healthy prior to this happening-concerned about mass needing to be followed up on when discharged Pyschiatric History: No history deferred depression screen due to adjusting to the new unit and quite deconditioned from her illness. Do feel she would benefit from seeing neuro-psych while here for coping. Will get input from team. Substance Abuse History: Tobacco aware she needs to quit and plans to now, she hasn't smoked since 1/24 admission to the hospital.  Patient / Family Perceptions, Expectations & Goals Pt/Family understanding of illness & functional limitations: pt and brother can explain her issues and surgeries. Brother tries to speak with the MD daily to hear her treatment plan and daily progress. Pt relies upon her brother to remember what was discussed and remind her. She feels at times she is out of it. Premorbid pt/family roles/activities: Sibling, employee, friend, aunt, etc Anticipated changes in roles/activities/participation: resume Pt/family expectations/goals: Pt states: " I want to do well here and be able to take care of myself." Brother states: " I hope she does well but will help her at home."  [employer]: None Premorbid Home Care/DME Agencies: None Transportation available at discharge: Brother Resource referrals recommended: Neuropsychology  Discharge  Planning Living Arrangements: Other relatives Support Systems: Other relatives, Friends/neighbors Type of Residence: Private residence Administrator, sports: Multimedia programmer (specify)(BC[employer]) Museum/gallery curator Resources: Employment, Secondary school teacher Screen Referred: No Living Expenses: Education officer, community Management: Patient, Family Does the patient have any problems obtaining your medications?: No Home Management: Both she and brother Patient/Family Preliminary Plans: Return home with brother who can provide assist due to he is retired and in good health. He plans to be here daily to provide support and watch her progress. Will await team's evaluations and work on discharge plans. Social Work Anticipated Follow Up Needs: HH/OP, Support Group  Clinical Impression Pleasant pt and brother who are supportive of one another. Pt motivated to do well but is severely deconditioned from long hospitalization and health issues. Would benefit from seeing neuro-psych while here for coping. Work on discharge plans.  Elease Hashimoto 04/23/2017, 11:40 AM

## 2017-04-24 ENCOUNTER — Inpatient Hospital Stay (HOSPITAL_COMMUNITY): Payer: BLUE CROSS/BLUE SHIELD

## 2017-04-24 ENCOUNTER — Inpatient Hospital Stay (HOSPITAL_COMMUNITY): Payer: BLUE CROSS/BLUE SHIELD | Admitting: Occupational Therapy

## 2017-04-24 ENCOUNTER — Inpatient Hospital Stay (HOSPITAL_COMMUNITY): Payer: BLUE CROSS/BLUE SHIELD | Admitting: Physical Therapy

## 2017-04-24 DIAGNOSIS — R0902 Hypoxemia: Secondary | ICD-10-CM

## 2017-04-24 LAB — CBC WITH DIFFERENTIAL/PLATELET
Basophils Absolute: 0 10*3/uL (ref 0.0–0.1)
Basophils Relative: 0 %
Eosinophils Absolute: 0.2 10*3/uL (ref 0.0–0.7)
Eosinophils Relative: 2 %
HCT: 28.4 % — ABNORMAL LOW (ref 36.0–46.0)
Hemoglobin: 8.8 g/dL — ABNORMAL LOW (ref 12.0–15.0)
Lymphocytes Relative: 14 %
Lymphs Abs: 1.3 10*3/uL (ref 0.7–4.0)
MCH: 30.4 pg (ref 26.0–34.0)
MCHC: 31 g/dL (ref 30.0–36.0)
MCV: 98.3 fL (ref 78.0–100.0)
Monocytes Absolute: 0.7 10*3/uL (ref 0.1–1.0)
Monocytes Relative: 7 %
Neutro Abs: 7 10*3/uL (ref 1.7–7.7)
Neutrophils Relative %: 77 %
Platelets: 182 10*3/uL (ref 150–400)
RBC: 2.89 MIL/uL — ABNORMAL LOW (ref 3.87–5.11)
RDW: 16.4 % — ABNORMAL HIGH (ref 11.5–15.5)
WBC: 9.1 10*3/uL (ref 4.0–10.5)

## 2017-04-24 LAB — GLUCOSE, CAPILLARY
Glucose-Capillary: 83 mg/dL (ref 65–99)
Glucose-Capillary: 80 mg/dL (ref 65–99)
Glucose-Capillary: 97 mg/dL (ref 65–99)

## 2017-04-24 MED ORDER — TRAMADOL HCL 50 MG PO TABS
50.0000 mg | ORAL_TABLET | Freq: Four times a day (QID) | ORAL | Status: DC | PRN
  Administered 2017-04-24 – 2017-04-25 (×3): 50 mg via ORAL
  Filled 2017-04-24 (×3): qty 1

## 2017-04-24 NOTE — Progress Notes (Addendum)
Occupational Therapy Session Note  Patient Details  Name: Kayla Wyatt MRN: [id number] Date of Birth: [birth year]  Today's Date: 04/24/2017 OT Individual Time: 2956-2130 OT Individual Time Calculation (min): 26 min    Short Term Goals: Week 1:  OT Short Term Goal 1 (Week 1): STGs = LTGs  Skilled Therapeutic Interventions/Progress Updates:    1:1. Focus of session on sit to stand and acitivity tolerance. Pt requesting to brush and style hair. Pt stands at sink to brush hair with supervision and OT styles hair in standing with VC for equal weight bearing over B feet. OT propels w/c to/from tx destniations for energy conservation. Pt folds laundry for 8 min seated in w/c reaching laterally in B directions to retrieve laundry, fold in lap and place laundry in piles to improve functional endurance required for ADLs and VC for pursed lip breathing. Exited session with pt sitting in w/c with brother present and call light in reach  Therapy Documentation Precautions:  Precautions Precautions: Fall, Other (comment) Precaution Comments: seixure Restrictions Weight Bearing Restrictions: No General:    See Function Navigator for Current Functional Status.   Therapy/Group: Individual Therapy  [name] 04/24/2017, 3:58 PM

## 2017-04-24 NOTE — Progress Notes (Signed)
Occupational Therapy Session Note  Patient Details  Name: Kayla Wyatt MRN: [id number] Date of Birth: [birth year]  Today's Date: 04/24/2017 OT Individual Time: 1102-1202 OT Individual Time Calculation (min): 60 min    Short Term Goals: Week 1:  OT Short Term Goal 1 (Week 1): STGs = LTGs  Skilled Therapeutic Interventions/Progress Updates:    Treatment session with focus on functional mobility and activity tolerance.  Pt received supine in bed reporting pain in stomach from AM having subsided.  Completed stand pivot transfer bed > w/c with min guard.  Engaged in familiar card activity in standing with pt tolerating standing ~3 mins before requiring seated rest break.  O2 dropped to 84% on 2L O2 in standing, returning to low 90s with cues for breathing strategy.  Continued standing during activity, again able to tolerate ~3 mins each time before returning to sitting.  Upon return to room, pt reports need to toilet.  Ambulated to toilet with RW with min guard, min assist for transfer on/off toilet.  Returned to bed and left seated at EOB with nurse tech present for vitals.  Therapy Documentation Precautions:  Precautions Precautions: Fall, Other (comment) Precaution Comments: seixure Restrictions Weight Bearing Restrictions: No General:   Vital Signs: Oxygen Therapy SpO2: 97 % O2 Device: Nasal Cannula O2 Flow Rate (L/min): 2 L/min Pain: Pain Assessment Pain Assessment: 0-10 Pain Score: 5  Pain Type: Acute pain Pain Location: Abdomen Pain Orientation: Upper Pain Descriptors / Indicators: Cramping;Discomfort;Grimacing Pain Onset: Gradual Pain Intervention(s): Medication (See eMAR)  See Function Navigator for Current Functional Status.   Therapy/Group: Individual Therapy  Simonne Come 04/24/2017, 12:08 PM

## 2017-04-24 NOTE — Progress Notes (Signed)
Occupational Therapy Session Note  Patient Details  Name: Kayla Wyatt MRN: [id number] Date of Birth: 1956/02/02  Today's Date: 04/24/2017 OT Individual Time: 1017-5102 OT Individual Time Calculation (min): 57 min    Short Term Goals: Week 1:  OT Short Term Goal 1 (Week 1): STGs = LTGs  Skilled Therapeutic Interventions/Progress Updates:    1:1. Pt reporting pain in back with RN already administered medication. Pt willing to complete bathing and dressing at sink level after toileting. Pt completes all stand pivot transfers and sit to stands with touching A except from low commode requiring MOD A to power up for sit to stand for peri care. Pt bates UB/LB with supervision crossing BLE into seated figure four to wash feet and apply lotion. Pt dons pull over shirt with set up and pants with min guard for balance. OT dons teds and pt dons shoes with set up. Pt requires encouragement to stand to brush teeth at sink with min guard. Applied foam handle to w/c brake to improve indepence with locking w/c brake for safety. Exited session with pt seated in bed and call light in reach.  Therapy Documentation Precautions:  Precautions Precautions: Fall, Other (comment) Precaution Comments: seixure Restrictions Weight Bearing Restrictions: (P) No General:    See Function Navigator for Current Functional Status.   Therapy/Group: Individual Therapy  [name] 04/24/2017, 9:58 AM

## 2017-04-24 NOTE — Progress Notes (Signed)
Physical Therapy Note  Patient Details  Name: Kayla Wyatt MRN: [id number] Date of Birth: 1956/01/05 Today's Date: 04/24/2017    Time: 1415-1515 60 minutes  1:1 Pt c/o pain in ribs, RN made aware, meds given during session and ice applied during and after session.  Bed mobility with supervision.  Stand pivot transfers with supervision.  Toilet transfer with min A.  Gait with RW with close supervision/min A with obstacle negotiation and in controlled environments up to 100'.  spO2 95% on 2LO2.  Gait without AD 50' x 2 with min A.  nustep for activity tolerance with spO2 > 95% throughout x 6 minutes level 4.  Standing balance without AD on foam with min A for horseshoe toss activity.  Pt requires frequent rests due to rib pain but is motivated to improve.  RN aware of continued rib pain despite pain meds.   Rashanda Magloire 04/24/2017, 3:16 PM

## 2017-04-24 NOTE — Progress Notes (Signed)
[place] PHYSICAL MEDICINE & REHABILITATION     PROGRESS NOTE  Subjective/Complaints:  Pt seen lying in bed this AM.  She slept well overnight. She states she had a good first day of therapies yesterday.   ROS: +DOE. Denies CP, nausea, vomiting, diarrhea  Objective: Vital Signs: Blood pressure (!) 125/30, pulse 62, temperature 98.6 F (37 C), temperature source Oral, resp. rate 19, height 5\' 3"  (1.6 m), weight 64 kg (141 lb 1.5 oz), SpO2 100 %. No results found. Recent Labs    04/23/17 0509 04/24/17 0743  WBC 4.8 9.1  HGB 8.0* 8.8*  HCT 26.0* 28.4*  PLT 156 182   Recent Labs    04/22/17 0438 04/23/17 0509  NA 141 141  K 3.2* 3.6  CL 110 109  GLUCOSE 94 91  BUN 6 9  CREATININE 0.90 0.93  CALCIUM 7.8* 8.2*   CBG (last 3)  Recent Labs    04/22/17 0829 04/22/17 1200 04/23/17 1142  GLUCAP 75 154* 91    Wt Readings from Last 3 Encounters:  04/24/17 64 kg (141 lb 1.5 oz)  04/22/17 62 kg (136 lb 11 oz)    Physical Exam:  BP (!) 125/30 (BP Location: Left Arm)   Pulse 62   Temp 98.6 F (37 C) (Oral)   Resp 19   Ht 5\' 3"  (1.6 m)   Wt 64 kg (141 lb 1.5 oz)   SpO2 100%   BMI 24.99 kg/m  Constitutional: She appears well-developed. She has a sickly appearance. No distress.  HENT: Normocephalic and atraumatic.  Eyes: EOM are normal. No discharge.  Cardiovascular: Irregularly irregular. No JVD. Respiratory: Clear. DOE. +[place] [facility]: Bowel sounds are normal. She exhibits no distension.  Musculoskeletal: She exhibits no edema or tenderness.  Neurological: She is alert and oriented.  She is able to follow basic 1 and 2 step command without difficulty.  Motor: 4/5 grossly throughout  Skin: Skin is warm and dry. She is not diaphoretic.  Stage II decub on sacrum with yellow eschar and surrounding erythema, not examined today.   Psychiatric: Her speech is normal and behavior is normal. Her mood appears slightly anxious.   Assessment/Plan: 1. Functional deficits secondary  to PRES which require 3+ hours per day of interdisciplinary therapy in a comprehensive inpatient rehab setting. Physiatrist is providing close team supervision and 24 hour management of active medical problems listed below. Physiatrist and rehab team continue to assess barriers to discharge/monitor patient progress toward functional and medical goals.  Function:  Bathing Bathing position   Position: Wheelchair/chair at sink  Bathing parts Body parts bathed by patient: Right arm, Left arm, Chest, Abdomen, Front perineal area, Buttocks, Right upper leg, Left upper leg, Right lower leg, Left lower leg Body parts bathed by helper: Back  Bathing assist Assist Level: Touching or steadying assistance(Pt > 75%)      Upper Body Dressing/Undressing Upper body dressing   What is the patient wearing?: Bra, Pull over shirt/dress Bra - Perfomed by patient: Thread/unthread right bra strap, Hook/unhook bra (pull down sports bra), Thread/unthread left bra strap   Pull over shirt/dress - Perfomed by patient: Thread/unthread right sleeve, Thread/unthread left sleeve, Put head through opening, Pull shirt over trunk          Upper body assist Assist Level: Set up      Lower Body Dressing/Undressing Lower body dressing   What is the patient wearing?: Underwear, Pants, [employer], Shoes Underwear - Performed by patient: Thread/unthread right underwear leg, Thread/unthread  left underwear leg, Pull underwear up/down   Pants- Performed by patient: Thread/unthread right pants leg, Thread/unthread left pants leg, Pull pants up/down           Shoes - Performed by patient: Don/doff right shoe, Don/doff left shoe         TED Hose - Performed by helper: Don/doff right TED hose, Don/doff left TED hose  Lower body assist Assist for lower body dressing: Touching or steadying assistance (Pt > 75%)      Toileting Toileting   Toileting steps completed by patient: Adjust clothing prior to toileting, Performs  perineal hygiene, Adjust clothing after toileting Toileting steps completed by helper: Adjust clothing prior to toileting, Adjust clothing after toileting, Performs perineal hygiene    Toileting assist Assist level: Supervision or verbal cues   Transfers Chair/bed transfer   Chair/bed transfer method: Stand pivot Chair/bed transfer assist level: Moderate assist (Pt 50 - 74%/lift or lower) Chair/bed transfer assistive device: Armrests     Locomotion Ambulation     Max distance: 15' Assist level: Moderate assist (Pt 50 - 74%)   Wheelchair          Cognition Comprehension Comprehension assist level: Follows complex conversation/direction with extra time/assistive device  Expression Expression assist level: Expresses complex ideas: With extra time/assistive device  Social Interaction Social Interaction assist level: Interacts appropriately with others - No medications needed.  Problem Solving Problem solving assist level: Solves basic problems with no assist  Memory Memory assist level: More than reasonable amount of time    Medical Problem List and Plan: 1.  Deficits with mobility, endurance, self-care secondary to PRES   Cont CIR 2.  RLE DVT : Treated with IVC filter. No anticoagulation-- due to significant bleed on Xarelto.  3. Pain Management: tylenol prn 4. Mood: team to provide ego support. LCSW to follow for evaluation and support.  5. Neuropsych: This patient is not fully capable of making decisions on her own behalf. 6. Skin/Wound Care: Air mattress overlay for sacral decub with MASD. Added santyl with damp to dry dressing. Add vitamins as well as protein supplements to help promote wound healing.  7. Fluids/Electrolytes/Nutrition: Monitor I/Os. Added supplements with meals.  8. Gastric ulcer/GIB with hemorrhagic shock/ABLA:  Has been treated with 6 units PRBC.    Hemoglobin 8.8 on 2/15   Labs ordered for Monday 9. Mediastinal mass: Will need to follow up on outpatient  basis  10. AKI:  Has resolved. Continue to encourage adequate intake.  11. PRES with new onset seizures: Continue Keppra bid--follow MRI brain in 2 weeks (~2/28) 12. Abdominal pain: Resolving. Monitor stools for any evidence of recurrent bleeding.  13. Hypokalemia: Has been persistent. Added supplement.    Potassium 3.6 on 2/14   Labs ordered for Monday   Continue to monitor 14. Hypoxia: Continue nebulizers--encourage use bid.  Wean supplemental oxgen as able.  15. Hypocalcemia: Added CaCO3 for oral supplement.    Calcium 8.2 on 2/14   Labs ordered for Monday   Continue to monitor   16. Hyperglycemia   CBGs ordered, pending   Will order HbA1c for Monday 17. Hypomagnesemia   Supplement initiated on 2/14   Labs ordered for Monday 18. Hypoalbuminemia   Supplement initiated on 2/14  LOS (Days) 2 A FACE TO FACE EVALUATION WAS PERFORMED  Lannah Koike Lorie Phenix 04/24/2017 8:29 AM

## 2017-04-25 ENCOUNTER — Inpatient Hospital Stay (HOSPITAL_COMMUNITY): Payer: BLUE CROSS/BLUE SHIELD | Admitting: Occupational Therapy

## 2017-04-25 ENCOUNTER — Inpatient Hospital Stay (HOSPITAL_COMMUNITY): Payer: BLUE CROSS/BLUE SHIELD

## 2017-04-25 DIAGNOSIS — G7281 Critical illness myopathy: Secondary | ICD-10-CM

## 2017-04-25 DIAGNOSIS — R1030 Lower abdominal pain, unspecified: Secondary | ICD-10-CM

## 2017-04-25 LAB — GLUCOSE, CAPILLARY
Glucose-Capillary: 107 mg/dL — ABNORMAL HIGH (ref 65–99)
Glucose-Capillary: 98 mg/dL (ref 65–99)
Glucose-Capillary: 92 mg/dL (ref 65–99)
Glucose-Capillary: 120 mg/dL — ABNORMAL HIGH (ref 65–99)

## 2017-04-25 LAB — CBC WITH DIFFERENTIAL/PLATELET
Basophils Absolute: 0 10*3/uL (ref 0.0–0.1)
Basophils Relative: 0 %
Eosinophils Absolute: 0 10*3/uL (ref 0.0–0.7)
Eosinophils Relative: 1 %
HCT: 30.6 % — ABNORMAL LOW (ref 36.0–46.0)
Hemoglobin: 9.6 g/dL — ABNORMAL LOW (ref 12.0–15.0)
Lymphocytes Relative: 32 %
Lymphs Abs: 1.3 10*3/uL (ref 0.7–4.0)
MCH: 30.3 pg (ref 26.0–34.0)
MCHC: 31.4 g/dL (ref 30.0–36.0)
MCV: 96.5 fL (ref 78.0–100.0)
Monocytes Absolute: 0.3 10*3/uL (ref 0.1–1.0)
Monocytes Relative: 7 %
Neutro Abs: 2.5 10*3/uL (ref 1.7–7.7)
Neutrophils Relative %: 60 %
Platelets: 227 10*3/uL (ref 150–400)
RBC: 3.17 MIL/uL — ABNORMAL LOW (ref 3.87–5.11)
RDW: 16.1 % — ABNORMAL HIGH (ref 11.5–15.5)
WBC: 4.1 10*3/uL (ref 4.0–10.5)

## 2017-04-25 NOTE — Progress Notes (Signed)
Occupational Therapy Session Note  Patient Details  Name: Kayla Wyatt MRN: [id number] Date of Birth: [birth year]  Today's Date: 04/25/2017 OT Individual Time: 2595-6387 OT Individual Time Calculation (min): 57 min    Short Term Goals: Week 1:  OT Short Term Goal 1 (Week 1): STGs = LTGs  Skilled Therapeutic Interventions/Progress Updates:    Treatment session with focus on activity tolerance during self-care tasks.  Pt received supine in bed reporting pain in ribs/abdomen over night resulting in minimal sleep but willing to engage in treatment session. Pt ambulated to sink with RW with close supervision to complete bathing.  Removed O2 during bathing with pt dropping to 92% on room air with sit > stand during bathing.  Ambulated to toilet with RW on room air, pt dropping to 90% on room air with ambulation to/from toilet.  1.5L O2 returned via nasal canula with pt sats returning to 97%.  Pt reports somewhat difficulty breathing post ambulation.  Completed grooming tasks in standing for increased activity tolerance.  Pt returned to bed at end of session and left semi-reclined with O2 on and bed alarm set.  Therapy Documentation Precautions:  Precautions Precautions: Fall, Other (comment) Precaution Comments: seixure Restrictions Weight Bearing Restrictions: No General:   Vital Signs:  Pain:   ADL: ADL ADL Comments: refer to functional navigator Vision   Perception    Praxis   Exercises:   Other Treatments:    See Function Navigator for Current Functional Status.   Therapy/Group: Individual Therapy  Simonne Come 04/25/2017, 8:24 AM

## 2017-04-25 NOTE — Progress Notes (Signed)
[facility] PHYSICAL MEDICINE & REHABILITATION     PROGRESS NOTE  Subjective/Complaints:  Complains of abdominal discomfort, especially when she eats. Sitting EOB  ROS: pt denies  vomiting, diarrhea, cough, shortness of breath or chest pain   Objective: Vital Signs: Blood pressure (!) 123/39, pulse 77, temperature 98.3 F (36.8 C), temperature source Oral, resp. rate 18, height 5\' 3"  (1.6 m), weight 63.7 kg (140 lb 6.9 oz), SpO2 95 %. No results found. Recent Labs    04/24/17 0743 04/25/17 0536  WBC 9.1 4.1  HGB 8.8* 9.6*  HCT 28.4* 30.6*  PLT 182 227   Recent Labs    04/23/17 0509  NA 141  K 3.6  CL 109  GLUCOSE 91  BUN 9  CREATININE 0.93  CALCIUM 8.2*   CBG (last 3)  Recent Labs    04/24/17 1638 04/24/17 2104 04/25/17 0649  GLUCAP 80 97 107*    Wt Readings from Last 3 Encounters:  04/25/17 63.7 kg (140 lb 6.9 oz)  04/22/17 62 kg (136 lb 11 oz)    Physical Exam:  BP (!) 123/39 (BP Location: Left Arm)   Pulse 77   Temp 98.3 F (36.8 C) (Oral)   Resp 18   Ht 5\' 3"  (1.6 m)   Wt 63.7 kg (140 lb 6.9 oz)   SpO2 95%   BMI 24.88 kg/m  Constitutional: She appears well-developed. She has a sickly appearance. No distress.  HENT: Normocephalic and atraumatic.  Eyes: EOM are normal. No discharge.  Cardiovascular: Irregularly irregular without JVD or murmur . Respiratory: Clear. No dyspnea, +[place] GI: bS+, tender to palpation, no distention  Musculoskeletal: She exhibits no edema or tenderness.  Neurological: She is alert and oriented.     Motor: 3+ to 4/5 grossly throughout  Skin: Skin is warm and dry. She is not diaphoretic.  Stage II decub on sacrum with yellow eschar and surrounding erythema, not examined today.   Psychiatric: Her speech is normal and behavior is normal. Her mood appears slightly anxious.   Assessment/Plan: 1. Functional deficits secondary to PRES which require 3+ hours per day of interdisciplinary therapy in a comprehensive inpatient rehab  setting. Physiatrist is providing close team supervision and 24 hour management of active medical problems listed below. Physiatrist and rehab team continue to assess barriers to discharge/monitor patient progress toward functional and medical goals.  Function:  Bathing Bathing position   Position: Wheelchair/chair at sink  Bathing parts Body parts bathed by patient: Right arm, Left arm, Chest, Abdomen, Front perineal area, Buttocks, Right upper leg, Left upper leg Body parts bathed by helper: Back  Bathing assist Assist Level: Supervision or verbal cues      Upper Body Dressing/Undressing Upper body dressing   What is the patient wearing?: Bra, Pull over shirt/dress Bra - Perfomed by patient: Thread/unthread right bra strap, Hook/unhook bra (pull down sports bra), Thread/unthread left bra strap   Pull over shirt/dress - Perfomed by patient: Thread/unthread right sleeve, Thread/unthread left sleeve, Put head through opening, Pull shirt over trunk          Upper body assist Assist Level: Set up      Lower Body Dressing/Undressing Lower body dressing   What is the patient wearing?: Underwear, Pants, [employer], Shoes Underwear - Performed by patient: Thread/unthread right underwear leg, Thread/unthread left underwear leg, Pull underwear up/down   Pants- Performed by patient: Thread/unthread right pants leg, Thread/unthread left pants leg, Pull pants up/down  Shoes - Performed by patient: Don/doff right shoe, Don/doff left shoe         TED Hose - Performed by helper: Don/doff right TED hose, Don/doff left TED hose  Lower body assist Assist for lower body dressing: Supervision or verbal cues      Toileting Toileting   Toileting steps completed by patient: Adjust clothing prior to toileting, Performs perineal hygiene, Adjust clothing after toileting Toileting steps completed by helper: Adjust clothing prior to toileting, Adjust clothing after toileting, Performs  perineal hygiene    Toileting assist Assist level: Supervision or verbal cues   Transfers Chair/bed transfer   Chair/bed transfer method: Stand pivot Chair/bed transfer assist level: Moderate assist (Pt 50 - 74%/lift or lower) Chair/bed transfer assistive device: Armrests     Locomotion Ambulation     Max distance: 15' Assist level: Moderate assist (Pt 50 - 74%)   Wheelchair          Cognition Comprehension Comprehension assist level: Follows complex conversation/direction with extra time/assistive device  Expression Expression assist level: Expresses complex ideas: With extra time/assistive device  Social Interaction Social Interaction assist level: Interacts appropriately with others - No medications needed.  Problem Solving Problem solving assist level: Solves basic problems with no assist  Memory Memory assist level: More than reasonable amount of time    Medical Problem List and Plan: 1.  Deficits with mobility, endurance, self-care secondary to PRES   Cont CIR therapies 2.  RLE DVT : Treated with IVC filter. No anticoagulation-- due to significant bleed on Xarelto.  3. Pain Management: tylenol prn 4. Mood: team to provide ego support. LCSW to follow for evaluation and support.  5. Neuropsych: This patient is not fully capable of making decisions on her own behalf. 6. Skin/Wound Care: Air mattress overlay for sacral decub with MASD. Added santyl with damp to dry dressing. Add vitamins as well as protein supplements to help promote wound healing.  7. Fluids/Electrolytes/Nutrition: Monitor I/Os. Added supplements with meals.  8. Gastric ulcer/GIB with hemorrhagic shock/ABLA:  Has been treated with 6 units PRBC.    Hemoglobin up to 9.6 today   Labs ordered for Monday   -ongoing GI discomfort especially when eating    -GI cocktail, protonix, TUMS ordered    -continue to encourage PO intake 9. Mediastinal mass: Will need to follow up on outpatient basis  10. AKI:  Has  resolved. Continue to encourage adequate intake.  11. PRES with new onset seizures: Continue Keppra bid--follow MRI brain in 2 weeks (~2/28) 12. Abdominal pain: see above.  13. Hypokalemia: Has been persistent. Added supplement.    Potassium 3.6 on 2/14   Labs ordered for Monday   Continue to monitor 14. Hypoxia: Continue nebulizers--encourage use bid.  Wean supplemental oxgen as able.  15. Hypocalcemia: Added CaCO3 for oral supplement.    Calcium 8.2 on 2/14   Labs ordered for Monday   Continue to monitor   16. Hyperglycemia   CBGs normal     HbA1c for Monday 17. Hypomagnesemia   Supplement initiated on 2/14   Labs ordered for Monday 18. Hypoalbuminemia   Supplement initiated on 2/14  LOS (Days) 3 A FACE TO FACE EVALUATION WAS PERFORMED  Garrell Flagg T 04/25/2017 11:34 AM

## 2017-04-25 NOTE — Progress Notes (Signed)
Occupational Therapy Session Note  Patient Details  Name: Shawntia Mangal MRN: [id number] Date of Birth: 05/19/1955  Today's Date: 04/25/2017 OT Individual Time: 1000-1100 OT Individual Time Calculation (min): 60 min    Short Term Goals: Week 1:  OT Short Term Goal 1 (Week 1): STGs = LTGs  Skilled Therapeutic Interventions/Progress Updates:    1:1. Pt completes ambulatory transfer supine>toilet with supervision-min guard for ambualtion. Pt requires increased time to void bowel and bladder on low toilet. Pt requires lifting assistance from low toilet. Pt dons stands at sink to wash hands. Session completed on RA and O2 greater than 90% with min cueing for pursed lip breathing. Pt plays game of wii bowling in seated and standing with min guard with no AD to challenge balance. PT stand pivot transfer using bed rail and supervision with VC for hand placement. Exited session with pt seated in bed, exit alarm on and all needs met.   Therapy Documentation Precautions:  Precautions Precautions: Fall, Other (comment) Precaution Comments: seixure Restrictions Weight Bearing Restrictions: No General:    See Function Navigator for Current Functional Status.   Therapy/Group: Individual Therapy  [name] 04/25/2017, 10:57 AM

## 2017-04-25 NOTE — Progress Notes (Addendum)
Physical Therapy Note  Patient Details  Name: Kayla Wyatt MRN: [id number] Date of Birth: 12-15-1955 Today's Date: 04/25/2017  tx 1:  0915-1000, 45 min individual tx Pain: none per pt 2L O2 via [place].  [place], RN stated that pt's O2 sats had been 93% on 2L, so she had not attempted weaning yet.  Bed mobility using railing with supervision. Stand pivot transfer with RW to w/c, min guard assist.  Gait assessment on mulched ground without AD x 10' with mod assist.  Seated O2 sats 97% and HR 109, with DOE.  R/L: 10 x 1 hip flexion, 5 x 1 long arc quad knee ext with ankle pumps at end range. HR 121 and O2 sats 97% after ex.  Gait training on level tile with RW with multiple turns in each direction, min guard assist, x 120'.  O2 sats 96% , HR 109.   neuromuscular re-education via multimodal cues, positioning for balance retraining and sustained stretch bil heel cords, x 30 seconds iwht bil UE support fading to 30 seconds without UE support, with VCs for trunk flex to counteract LOB backwards. 2nd bout x 1 minute without UE support, without LOB without cuesl.  O2 95% and HR 45.  Pt left resting in bed with alarm set and needs at hand.  Brother in room.  rx 2:  1530-1600, 30 min individual tx Pain: none per pt  W/c propulsion using bil UEs with occasional min assist for steering ,as pt veers L,   On 1 L O2 via [place], O2 sats 96% and HR 109. Seated strengthening exs: 210 x 1 heel/toe raises with 2 second holds.  Pt stated she needed to use toilet urgently, although she had just had a BM before this tx.  Toilet transfer for continent BM.  Returned to gym. Gait training through cone obstacle course with RW, accurately and quickly.  Pt stated she was always a fast walker.  O2 sats 96% after obstacle course.  Pt left resting in bed with alarm set, needs at hand and family present.  See function navigator for current status.   Aulden Calise 04/25/2017, 8:10 AM

## 2017-04-26 ENCOUNTER — Inpatient Hospital Stay (HOSPITAL_COMMUNITY): Payer: BLUE CROSS/BLUE SHIELD | Admitting: Occupational Therapy

## 2017-04-26 DIAGNOSIS — K253 Acute gastric ulcer without hemorrhage or perforation: Secondary | ICD-10-CM

## 2017-04-26 LAB — CBC WITH DIFFERENTIAL/PLATELET
Basophils Absolute: 0 10*3/uL (ref 0.0–0.1)
Basophils Relative: 1 %
Eosinophils Absolute: 0.1 10*3/uL (ref 0.0–0.7)
Eosinophils Relative: 2 %
HCT: 27 % — ABNORMAL LOW (ref 36.0–46.0)
Hemoglobin: 8.8 g/dL — ABNORMAL LOW (ref 12.0–15.0)
Lymphocytes Relative: 26 %
Lymphs Abs: 1.5 10*3/uL (ref 0.7–4.0)
MCH: 31 pg (ref 26.0–34.0)
MCHC: 32.6 g/dL (ref 30.0–36.0)
MCV: 95.1 fL (ref 78.0–100.0)
Monocytes Absolute: 0.4 10*3/uL (ref 0.1–1.0)
Monocytes Relative: 7 %
Neutro Abs: 3.8 10*3/uL (ref 1.7–7.7)
Neutrophils Relative %: 64 %
Platelets: 201 10*3/uL (ref 150–400)
RBC: 2.84 MIL/uL — ABNORMAL LOW (ref 3.87–5.11)
RDW: 16.2 % — ABNORMAL HIGH (ref 11.5–15.5)
WBC: 5.9 10*3/uL (ref 4.0–10.5)

## 2017-04-26 LAB — GLUCOSE, CAPILLARY
Glucose-Capillary: 87 mg/dL (ref 65–99)
Glucose-Capillary: 106 mg/dL — ABNORMAL HIGH (ref 65–99)
Glucose-Capillary: 86 mg/dL (ref 65–99)
Glucose-Capillary: 93 mg/dL (ref 65–99)

## 2017-04-26 MED ORDER — LOPERAMIDE HCL 2 MG PO CAPS
2.0000 mg | ORAL_CAPSULE | ORAL | Status: DC | PRN
  Administered 2017-04-26 – 2017-04-29 (×3): 2 mg via ORAL
  Filled 2017-04-26 (×3): qty 1

## 2017-04-26 MED ORDER — PSYLLIUM 95 % PO PACK
1.0000 | PACK | Freq: Every day | ORAL | Status: DC
  Administered 2017-04-26 – 2017-04-28 (×4): 1 via ORAL
  Filled 2017-04-26 (×4): qty 1

## 2017-04-26 MED ORDER — SACCHAROMYCES BOULARDII 250 MG PO CAPS
250.0000 mg | ORAL_CAPSULE | Freq: Two times a day (BID) | ORAL | Status: DC
  Administered 2017-04-26 – 2017-04-30 (×9): 250 mg via ORAL
  Filled 2017-04-26 (×9): qty 1

## 2017-04-26 NOTE — Progress Notes (Signed)
Occupational Therapy Session Note  Patient Details  Name: Kayla Wyatt MRN: [id number] Date of Birth: 28-May-1955  Today's Date: 04/26/2017 OT Individual Time: 5597-4163 OT Individual Time Calculation (min): 47 min   Short Term Goals: Week 1:  OT Short Term Goal 1 (Week 1): STGs = LTGs  Skilled Therapeutic Interventions/Progress Updates:    Pt greeted EOB. Her brother was bent over and breathing heavily while trying to put on her Ted stocking. Educated pt/brother [place] on IT trainer for [employer] with modeling provided from therapist. Patrick Jupiter was then able to don Lt Ted hose with visibly increased ease. Educated him on ways to improve body mechanics to prevent back strain. Pts 02 sats stable and supplemental 02 remoevd for 02 weaning. Continued family education with Patrick Jupiter in regards to toilet and w/c transfers. [place] provided with hands on practice, and after training, able to exhibit increased knowledge of safety cuing when pt used RW and w/c during functional transfers. Cleared on safety plan. Pts 02 sats on RA >92% throughout, with cuing for deep breathing. Discussed ELOS, OT goals, and d/c planning for home. Pt self propelled to tub shower room for endurance/UB strengthening. [place] provided with additional hands on practice during tub bench transfers using RW at ambulatory level (as bathroom is walker accessible at home). Educated them on safe modifications to implement in bathroom once she returned home. Also discussed keeping door ajar to increase ease of breathing when showering. Pt and brother receptive to education with teach back utilized for understanding. Pt slightly SOB after transfers, 02 sats 95% on RA at this point. After rest, she self propelled back to room. Pt was left with brother and all needs at session exit. RN in agreement to keep supplemental 02 off at rest.   Therapy Documentation Precautions:  Precautions Precautions: Fall, Other (comment) Precaution Comments:  seixure Restrictions Weight Bearing Restrictions: No Vital Signs: Therapy Vitals Pulse Rate: (!) 52 Resp: 16 BP: (!) 87/60 Patient Position (if appropriate): Lying Oxygen Therapy SpO2: 99 % O2 Device: Nasal Cannula Pain: No c/o pain during session    ADL: ADL ADL Comments: refer to functional navigator     See Function Navigator for Current Functional Status.   Therapy/Group: Individual Therapy  Tobby Fawcett A Kendrea Cerritos 04/26/2017, 4:31 PM

## 2017-04-26 NOTE — Progress Notes (Signed)
[facility] PHYSICAL MEDICINE & REHABILITATION     PROGRESS NOTE  Subjective/Complaints:  Patient states bowels are moving frequently.  No odor or volatility related.  They are semi-formed.  Abdominal pain is better.  She is trying to eat.  Overall feels better than yesterday.  ROS: pt denies nausea, vomiting, diarrhea, cough, shortness of breath or chest pain    Objective: Vital Signs: Blood pressure 138/69, pulse 69, temperature 97.9 F (36.6 C), temperature source Oral, resp. rate 18, height 5\' 3"  (1.6 m), weight 61.4 kg (135 lb 5.8 oz), SpO2 98 %. No results found. Recent Labs    04/25/17 0536 04/26/17 0725  WBC 4.1 5.9  HGB 9.6* 8.8*  HCT 30.6* 27.0*  PLT 227 201   No results for input(s): NA, K, CL, GLUCOSE, BUN, CREATININE, CALCIUM in the last 72 hours.  Invalid input(s): CO CBG (last 3)  Recent Labs    04/25/17 1633 04/25/17 2025 04/26/17 0635  GLUCAP 92 120* 87    Wt Readings from Last 3 Encounters:  04/26/17 61.4 kg (135 lb 5.8 oz)  04/22/17 62 kg (136 lb 11 oz)    Physical Exam:  BP 138/69 (BP Location: Right Arm)   Pulse 69   Temp 97.9 F (36.6 C) (Oral)   Resp 18   Ht 5\' 3"  (1.6 m)   Wt 61.4 kg (135 lb 5.8 oz)   SpO2 98%   BMI 23.98 kg/m  Constitutional: She appears well-developed. She has a sickly appearance. No distress.  HENT: Normocephalic and atraumatic.  Eyes: EOM are normal. No discharge.  Cardiovascular: Irregularly irregular without JVD or murmur . Respiratory: Clear. No dyspnea, +[place] in place GI: BS +, minimally-tender, non-distended  Musculoskeletal: She exhibits no edema or tenderness.  Neurological: She is alert and oriented.     Motor: 3+ to 4/5 grossly throughout  Skin: Skin is warm and dry. She is not diaphoretic.  Stage II decub on sacrum with yellow eschar and surrounding erythema, not seen today.   Psychiatric: Her speech is normal and behavior is normal. Her mood appears slightly anxious.   Assessment/Plan: 1. Functional  deficits secondary to PRES which require 3+ hours per day of interdisciplinary therapy in a comprehensive inpatient rehab setting. Physiatrist is providing close team supervision and 24 hour management of active medical problems listed below. Physiatrist and rehab team continue to assess barriers to discharge/monitor patient progress toward functional and medical goals.  Function:  Bathing Bathing position   Position: Wheelchair/chair at sink  Bathing parts Body parts bathed by patient: Right arm, Left arm, Chest, Abdomen, Front perineal area, Buttocks, Right upper leg, Left upper leg Body parts bathed by helper: Back  Bathing assist Assist Level: Supervision or verbal cues      Upper Body Dressing/Undressing Upper body dressing   What is the patient wearing?: Bra, Pull over shirt/dress Bra - Perfomed by patient: Thread/unthread right bra strap, Hook/unhook bra (pull down sports bra), Thread/unthread left bra strap   Pull over shirt/dress - Perfomed by patient: Thread/unthread right sleeve, Thread/unthread left sleeve, Put head through opening, Pull shirt over trunk          Upper body assist Assist Level: Set up      Lower Body Dressing/Undressing Lower body dressing   What is the patient wearing?: Underwear, Pants, [employer], Shoes Underwear - Performed by patient: Thread/unthread right underwear leg, Thread/unthread left underwear leg, Pull underwear up/down   Pants- Performed by patient: Thread/unthread right pants leg, Thread/unthread left pants  leg, Pull pants up/down           Shoes - Performed by patient: Don/doff right shoe, Don/doff left shoe         TED Hose - Performed by helper: Don/doff right TED hose, Don/doff left TED hose  Lower body assist Assist for lower body dressing: Supervision or verbal cues      Toileting Toileting   Toileting steps completed by patient: Adjust clothing prior to toileting, Performs perineal hygiene, Adjust clothing after  toileting Toileting steps completed by helper: Adjust clothing prior to toileting, Adjust clothing after toileting, Performs perineal hygiene    Toileting assist Assist level: Supervision or verbal cues   Transfers Chair/bed transfer   Chair/bed transfer method: Stand pivot Chair/bed transfer assist level: Moderate assist (Pt 50 - 74%/lift or lower) Chair/bed transfer assistive device: Armrests     Locomotion Ambulation     Max distance: 15' Assist level: Moderate assist (Pt 50 - 74%)   Wheelchair          Cognition Comprehension Comprehension assist level: Follows complex conversation/direction with extra time/assistive device  Expression Expression assist level: Expresses complex ideas: With extra time/assistive device  Social Interaction Social Interaction assist level: Interacts appropriately with others - No medications needed.  Problem Solving Problem solving assist level: Solves basic problems with no assist  Memory Memory assist level: More than reasonable amount of time    Medical Problem List and Plan: 1.  Deficits with mobility, endurance, self-care secondary to PRES   Cont CIR therapies 2.  RLE DVT : Treated with IVC filter. No anticoagulation-- due to significant bleed on Xarelto.  3. Pain Management: tylenol prn 4. Mood: team to provide ego support. LCSW to follow for evaluation and support.  5. Neuropsych: This patient is not fully capable of making decisions on her own behalf. 6. Skin/Wound Care: Air mattress overlay for sacral decub with MASD. Added santyl with damp to dry dressing. Add vitamins as well as protein supplements to help promote wound healing.  7. Fluids/Electrolytes/Nutrition: Monitor I/Os. Added supplements with meals.  8. Gastric ulcer/GIB with hemorrhagic shock/ABLA:  Has been treated with 6 units PRBC.    Hemoglobin at 8.8 today.  No signs of active bleeding.  Inspected stool which was semi-formed and normal in color   Labs ordered for  Monday.  Continue to follow hemoglobin serially   -GI discomfort improved this morning    -GI cocktail, protonix, TUMS ordered    -continue to encourage PO intake   -Add fiber and probiotic to help bulk up stools 9. Mediastinal mass: Will need to follow up on outpatient basis  10. AKI:  Has resolved. Continue to encourage adequate intake.  11. PRES with new onset seizures: Continue Keppra bid--follow MRI brain in 2 weeks (~2/28) 12. Abdominal pain: see above.  13. Hypokalemia: Has been persistent. Added supplement.    Potassium 3.6 on 2/14   Labs ordered for Monday   Continue to monitor 14. Hypoxia: Continue nebulizers--encourage use bid.  Wean supplemental oxgen as able.  15. Hypocalcemia: Added CaCO3 for oral supplement.    Calcium 8.2 on 2/14   Labs ordered for Monday   Continue to monitor   16. Hyperglycemia   CBGs normal     HbA1c for Monday 17. Hypomagnesemia   Supplement initiated on 2/14   Labs ordered for Monday 18. Hypoalbuminemia   Supplement initiated on 2/14  LOS (Days) 4 A FACE TO FACE EVALUATION WAS PERFORMED  Alverda Nazzaro T 04/26/2017 8:43  AM

## 2017-04-27 ENCOUNTER — Inpatient Hospital Stay (HOSPITAL_COMMUNITY): Payer: BLUE CROSS/BLUE SHIELD | Admitting: Physical Therapy

## 2017-04-27 ENCOUNTER — Inpatient Hospital Stay (HOSPITAL_COMMUNITY): Payer: BLUE CROSS/BLUE SHIELD | Admitting: Occupational Therapy

## 2017-04-27 ENCOUNTER — Inpatient Hospital Stay (HOSPITAL_COMMUNITY): Payer: BLUE CROSS/BLUE SHIELD

## 2017-04-27 DIAGNOSIS — K253 Acute gastric ulcer without hemorrhage or perforation: Secondary | ICD-10-CM

## 2017-04-27 LAB — CBC WITH DIFFERENTIAL/PLATELET
Basophils Absolute: 0 10*3/uL (ref 0.0–0.1)
Basophils Relative: 1 %
Eosinophils Absolute: 0.2 10*3/uL (ref 0.0–0.7)
Eosinophils Relative: 3 %
HCT: 29.4 % — ABNORMAL LOW (ref 36.0–46.0)
Hemoglobin: 9.3 g/dL — ABNORMAL LOW (ref 12.0–15.0)
Lymphocytes Relative: 31 %
Lymphs Abs: 1.6 10*3/uL (ref 0.7–4.0)
MCH: 30.6 pg (ref 26.0–34.0)
MCHC: 31.6 g/dL (ref 30.0–36.0)
MCV: 96.7 fL (ref 78.0–100.0)
Monocytes Absolute: 0.3 10*3/uL (ref 0.1–1.0)
Monocytes Relative: 6 %
Neutro Abs: 3 10*3/uL (ref 1.7–7.7)
Neutrophils Relative %: 59 %
Platelets: 223 10*3/uL (ref 150–400)
RBC: 3.04 MIL/uL — ABNORMAL LOW (ref 3.87–5.11)
RDW: 16.4 % — ABNORMAL HIGH (ref 11.5–15.5)
WBC: 5.1 10*3/uL (ref 4.0–10.5)

## 2017-04-27 LAB — BASIC METABOLIC PANEL
Anion gap: 10 (ref 5–15)
BUN: 13 mg/dL (ref 6–20)
CO2: 23 mmol/L (ref 22–32)
Calcium: 8.8 mg/dL — ABNORMAL LOW (ref 8.9–10.3)
Chloride: 108 mmol/L (ref 101–111)
Creatinine, Ser: 0.77 mg/dL (ref 0.44–1.00)
GFR calc Af Amer: >60 mL/min (ref >60)
GFR calc non Af Amer: >60 mL/min (ref >60)
Glucose, Bld: 102 mg/dL — ABNORMAL HIGH (ref 65–99)
Potassium: 3.2 mmol/L — ABNORMAL LOW (ref 3.5–5.1)
Sodium: 141 mmol/L (ref 135–145)

## 2017-04-27 LAB — GLUCOSE, CAPILLARY
Glucose-Capillary: 86 mg/dL (ref 65–99)
Glucose-Capillary: 78 mg/dL (ref 65–99)
Glucose-Capillary: 93 mg/dL (ref 65–99)

## 2017-04-27 LAB — MAGNESIUM: Magnesium: 1.6 mg/dL — ABNORMAL LOW (ref 1.7–2.4)

## 2017-04-27 LAB — HEMOGLOBIN A1C
Hgb A1c MFr Bld: 4.7 % — ABNORMAL LOW (ref 4.8–5.6)
Mean Plasma Glucose: 88.19 mg/dL

## 2017-04-27 NOTE — Progress Notes (Signed)
Physical Therapy Session Note  Patient Details  Name: Kayla Wyatt MRN: [id number] Date of Birth: [birth year]  Today's Date: 04/27/2017 PT Individual Time: 7340-3709 PT Individual Time Calculation (min): 45 min   Short Term Goals: Week 1:  PT Short Term Goal 1 (Week 1): = LTGs due to LOS; Overall modified independent   Skilled Therapeutic Interventions/Progress Updates:    Pt seated in recliner in room, agreeable to participate in therapy session. Sit to stand with S to RW. Ambulation 3 x 200 ft with RW and SBA, Pt maintains SpO2 at 95% and above with activity while on room air. Nustep level 2 x 5 min, x 3 min B UE/LE for endurance. Manual w/c propulsion x 150 ft, x 300 ft with SBA for UE strength and endurance. Toilet transfer with SBA with use of grab bar and RW, pt is independent with clothing management and pericare. Pt left seated in recliner in room with needs in reach.  Therapy Documentation Precautions:  Precautions Precautions: Fall, Other (comment) Precaution Comments: seixure Restrictions Weight Bearing Restrictions: No  See Function Navigator for Current Functional Status.   Therapy/Group: Individual Therapy   Excell Seltzer, PT, DPT  04/27/2017, 1:57 PM

## 2017-04-27 NOTE — Progress Notes (Signed)
Occupational Therapy Session Note  Patient Details  Name: Elizeth Weinrich MRN: [id number] Date of Birth: [birth year]  Today's Date: 04/27/2017 OT Individual Time: 1400-1500 OT Individual Time Calculation (min): 60 min    Short Term Goals: Week 1:  OT Short Term Goal 1 (Week 1): STGs = LTGs  Skilled Therapeutic Interventions/Progress Updates:    Treatment session with focus on activity tolerance and bathroom transfers.  Pt received upright in recliner on room air.  Pt reports not using supplemental O2 since yesterday AM.  Discussed bathroom setup at home with pt reporting sliding doors on shower, therefore unable to use tub transfer bench.  Educated on transfer via stepping over tub ledge to sit on seat in shower, pt able to complete by stepping over ledge with Lt foot, sitting on seat, and then bringing RLE over tub ledge.  Discussed modifications with obtaining grab bar, even practicing transfer with suction cup grab bar.  Discussed pros and cons of suction grab bar with pt reporting understanding.  Engaged in activity tolerance and balance reactions while on Wii Fit.  Pt able to complete weight shifting and endurance activities for ~5 mins before requiring seated rest break.  Pt with intermittent SOB but maintaining > 90% on room air.  Pt propelled w/c back to room for strengthening and endurance.  Left reclined in bed with all needs in reach.  Therapy Documentation Precautions:  Precautions Precautions: Fall, Other (comment) Precaution Comments: seixure Restrictions Weight Bearing Restrictions: No General:   Vital Signs: Therapy Vitals Temp: 97.7 F (36.5 C) Temp Source: Oral Pulse Rate: 62 Resp: 18 BP: (!) 124/33 Patient Position (if appropriate): Sitting Oxygen Therapy SpO2: 96 % O2 Device: Not Delivered Pain: Pain Assessment Pain Assessment: No/denies pain  See Function Navigator for Current Functional Status.   Therapy/Group: Individual Therapy  Simonne Come 04/27/2017, 3:49 PM

## 2017-04-27 NOTE — Progress Notes (Signed)
[facility] PHYSICAL MEDICINE & REHABILITATION     PROGRESS NOTE  Subjective/Complaints:  Pt seen sitting up at the EOB eating breakfast this AM.  Husband present.  Pt slept well overnight and had a good weekend.  She is tolerating without supplemental O2.    ROS: Denies nausea, vomiting, diarrhea, shortness of breath or chest pain   Objective: Vital Signs: Blood pressure (!) 119/48, pulse (!) 54, temperature 98.2 F (36.8 C), temperature source Oral, resp. rate 18, height 5\' 3"  (1.6 m), weight 61.6 kg (135 lb 12.9 oz), SpO2 98 %. No results found. Recent Labs    04/25/17 0536 04/26/17 0725  WBC 4.1 5.9  HGB 9.6* 8.8*  HCT 30.6* 27.0*  PLT 227 201   No results for input(s): NA, K, CL, GLUCOSE, BUN, CREATININE, CALCIUM in the last 72 hours.  Invalid input(s): CO CBG (last 3)  Recent Labs    04/26/17 1634 04/26/17 2039 04/27/17 0615  GLUCAP 86 93 86    Wt Readings from Last 3 Encounters:  04/27/17 61.6 kg (135 lb 12.9 oz)  04/22/17 62 kg (136 lb 11 oz)    Physical Exam:  BP (!) 119/48 (BP Location: Left Arm)   Pulse (!) 54   Temp 98.2 F (36.8 C) (Oral)   Resp 18   Ht 5\' 3"  (1.6 m)   Wt 61.6 kg (135 lb 12.9 oz)   SpO2 98%   BMI 24.06 kg/m  Constitutional: She appears well-developed. No distress.  HENT: Normocephalic and atraumatic.  Eyes: EOM are normal. No discharge.  Cardiovascular: Irregularly irregular. No JVD. Respiratory: Clear. Unlabored. GI: BS +, non-tender  Musculoskeletal: She exhibits no edema or tenderness.  Neurological: She is alert and oriented.  Motor: 4/5 grossly throughout  Skin: Skin is warm and dry. She is not diaphoretic.  Stage II decub on sacrum with yellow eschar and surrounding erythema, not examined today.   Psychiatric: Her speech is normal and behavior is normal.   Assessment/Plan: 1. Functional deficits secondary to PRES which require 3+ hours per day of interdisciplinary therapy in a comprehensive inpatient rehab  setting. Physiatrist is providing close team supervision and 24 hour management of active medical problems listed below. Physiatrist and rehab team continue to assess barriers to discharge/monitor patient progress toward functional and medical goals.  Function:  Bathing Bathing position   Position: Wheelchair/chair at sink  Bathing parts Body parts bathed by patient: Right arm, Left arm, Chest, Abdomen, Front perineal area, Buttocks, Right upper leg, Left upper leg Body parts bathed by helper: Back  Bathing assist Assist Level: Supervision or verbal cues      Upper Body Dressing/Undressing Upper body dressing   What is the patient wearing?: Bra, Pull over shirt/dress Bra - Perfomed by patient: Thread/unthread right bra strap, Hook/unhook bra (pull down sports bra), Thread/unthread left bra strap   Pull over shirt/dress - Perfomed by patient: Thread/unthread right sleeve, Thread/unthread left sleeve, Put head through opening, Pull shirt over trunk          Upper body assist Assist Level: Set up      Lower Body Dressing/Undressing Lower body dressing   What is the patient wearing?: Underwear, Pants, [employer], Shoes Underwear - Performed by patient: Thread/unthread right underwear leg, Thread/unthread left underwear leg, Pull underwear up/down   Pants- Performed by patient: Thread/unthread right pants leg, Thread/unthread left pants leg, Pull pants up/down           Shoes - Performed by patient: Don/doff right shoe,  Don/doff left shoe         TED Hose - Performed by helper: Don/doff right TED hose, Don/doff left TED hose  Lower body assist Assist for lower body dressing: Supervision or verbal cues      Toileting Toileting   Toileting steps completed by patient: Adjust clothing prior to toileting, Performs perineal hygiene, Adjust clothing after toileting Toileting steps completed by helper: Adjust clothing prior to toileting, Adjust clothing after toileting, Performs  perineal hygiene    Toileting assist Assist level: Supervision or verbal cues   Transfers Chair/bed transfer   Chair/bed transfer method: Stand pivot Chair/bed transfer assist level: Moderate assist (Pt 50 - 74%/lift or lower) Chair/bed transfer assistive device: Armrests     Locomotion Ambulation     Max distance: 15' Assist level: Moderate assist (Pt 50 - 74%)   Wheelchair          Cognition Comprehension Comprehension assist level: Follows complex conversation/direction with extra time/assistive device  Expression Expression assist level: Expresses complex ideas: With extra time/assistive device  Social Interaction Social Interaction assist level: Interacts appropriately with others - No medications needed.  Problem Solving Problem solving assist level: Solves basic problems with no assist  Memory Memory assist level: More than reasonable amount of time    Medical Problem List and Plan: 1.  Deficits with mobility, endurance, self-care secondary to PRES   Cont CIR 2.  RLE DVT:    Treated with IVC filter. No anticoagulation-- due to significant bleed on Xarelto.  3. Pain Management: tylenol prn 4. Mood: team to provide ego support. LCSW to follow for evaluation and support.  5. Neuropsych: This patient is not fully capable of making decisions on her own behalf. 6. Skin/Wound Care: Air mattress overlay for sacral decub with MASD. Added santyl with damp to dry dressing. Add vitamins as well as protein supplements to help promote wound healing.  7. Fluids/Electrolytes/Nutrition: Monitor I/Os. Added supplements with meals.  8. Gastric ulcer/GIB with hemorrhagic shock/ABLA:  Has been treated with 6 units PRBC.    Hemoglobin at 8.8 2/17   Labs pending   Added fiber and probiotic to help bulk up stools 9. Mediastinal mass: Will need to follow up on outpatient basis  10. AKI:  Has resolved. Continue to encourage adequate intake.  11. PRES with new onset seizures: Continue Keppra  bid--follow MRI brain in 2 weeks (~2/28) 12. Abdominal pain: see above.  13. Hypokalemia: Has been persistent. Added supplement.    Potassium 3.6 on 2/14   Labs pending   Continue to monitor 14. Hypoxia: Continue nebulizers--encourage use bid.     Wean supplemental oxgen as tolerated.  15. Hypocalcemia: Added CaCO3 for oral supplement.    Calcium 8.2 on 2/14   Labs pending   Continue to monitor   16. Hyperglycemia   HbA1c pending 17. Hypomagnesemia   Supplement initiated on 2/14   Labs pending 18. Hypoalbuminemia   Supplement initiated on 2/14  LOS (Days) 5 A FACE TO FACE EVALUATION WAS PERFORMED  Kayla Wyatt Lorie Phenix 04/27/2017 8:24 AM

## 2017-04-27 NOTE — Progress Notes (Signed)
Physical Therapy Session Note  Patient Details  Name: Kayla Wyatt MRN: [id number] Date of Birth: 1956-01-25  Today's Date: 04/27/2017 PT Individual Time: 0900-1000 PT Individual Time Calculation (min): 60 min   Short Term Goals: Week 1:  PT Short Term Goal 1 (Week 1): = LTGs due to LOS; Overall modified independent   Skilled Therapeutic Interventions/Progress Updates:    Session focused on overall endurance/activity tolerance while performing functional mobility in room and self care tasks to perform toileting and bathing and dressing at sink in seated and standing positions. Pt performed functional transfers, dynamic standing balance (with 1 or no UE support) and gait with RW in room at supervision level. Cues throughout activity for deep breathing due to increased effort of breathing noted. sats remained stable on room air. Functional gait on unit for general strengthening and endurance with supervision with RW and stair negotiation x 12 steps with rails with supervision for LE strengthening and overall cardiovascular endurance. Pt making excellent progress.   Therapy Documentation Precautions:  Precautions Precautions: Fall, Other (comment) Precaution Comments: seixure Restrictions Weight Bearing Restrictions: No General:   Vital Signs: Room air and o2 sats > 90% throughout session. Increased effort of breathing with activity, cues for deep breathing to slow breathing rate  Pain:  Denies pain.   See Function Navigator for Current Functional Status.   Therapy/Group: Individual Therapy  Canary Brim Ivory Broad, PT, DPT  04/27/2017, 10:46 AM

## 2017-04-27 NOTE — Progress Notes (Signed)
Physical Therapy Note  Patient Details  Name: Kayla Wyatt MRN: [id number] Date of Birth: 02/01/1956 Today's Date: 04/27/2017    Time: 1115-1145 30 minutes  1:1 No c/o pain.  Standing balance/tolerance with wii bowling game with pt able to stand 10 minutes with 1 UE support to play game. Pt supervision for standing balance.  spO2 97% on room air after standing x 10 minutes.  W/c mobility for UE strengthening x 150' with supervision.  Gait with RW 200' with supervision.  Toilet transfers, toileting and clothing negotiation all with mod I.  Pt left in bed with alarm set, needs at hand.   Calvyn Kurtzman 04/27/2017, 11:54 AM

## 2017-04-28 ENCOUNTER — Inpatient Hospital Stay (HOSPITAL_COMMUNITY): Payer: BLUE CROSS/BLUE SHIELD | Admitting: Occupational Therapy

## 2017-04-28 ENCOUNTER — Inpatient Hospital Stay (HOSPITAL_COMMUNITY): Payer: BLUE CROSS/BLUE SHIELD | Admitting: Physical Therapy

## 2017-04-28 DIAGNOSIS — R197 Diarrhea, unspecified: Secondary | ICD-10-CM

## 2017-04-28 LAB — CBC WITH DIFFERENTIAL/PLATELET
Basophils Absolute: 0 10*3/uL (ref 0.0–0.1)
Basophils Relative: 1 %
Eosinophils Absolute: 0.1 10*3/uL (ref 0.0–0.7)
Eosinophils Relative: 2 %
HCT: 26.5 % — ABNORMAL LOW (ref 36.0–46.0)
Hemoglobin: 8.5 g/dL — ABNORMAL LOW (ref 12.0–15.0)
Lymphocytes Relative: 32 %
Lymphs Abs: 1.6 10*3/uL (ref 0.7–4.0)
MCH: 30.9 pg (ref 26.0–34.0)
MCHC: 32.1 g/dL (ref 30.0–36.0)
MCV: 96.4 fL (ref 78.0–100.0)
Monocytes Absolute: 0.3 10*3/uL (ref 0.1–1.0)
Monocytes Relative: 6 %
Neutro Abs: 3 10*3/uL (ref 1.7–7.7)
Neutrophils Relative %: 59 %
Platelets: 197 10*3/uL (ref 150–400)
RBC: 2.75 MIL/uL — ABNORMAL LOW (ref 3.87–5.11)
RDW: 16.2 % — ABNORMAL HIGH (ref 11.5–15.5)
WBC: 5.1 10*3/uL (ref 4.0–10.5)

## 2017-04-28 LAB — GLUCOSE, CAPILLARY: Glucose-Capillary: 85 mg/dL (ref 65–99)

## 2017-04-28 MED ORDER — MAGNESIUM OXIDE 400 (241.3 MG) MG PO TABS
400.0000 mg | ORAL_TABLET | Freq: Two times a day (BID) | ORAL | Status: DC
  Administered 2017-04-28 (×2): 400 mg via ORAL
  Filled 2017-04-28 (×2): qty 1

## 2017-04-28 MED ORDER — MAGNESIUM SULFATE 2 GM/50ML IV SOLN
2.0000 g | Freq: Every day | INTRAVENOUS | Status: AC
  Administered 2017-04-28 – 2017-04-29 (×2): 2 g via INTRAVENOUS
  Filled 2017-04-28 (×2): qty 50

## 2017-04-28 MED ORDER — CALCIUM POLYCARBOPHIL 625 MG PO TABS
625.0000 mg | ORAL_TABLET | Freq: Every day | ORAL | Status: DC
  Administered 2017-04-28 – 2017-04-29 (×2): 625 mg via ORAL
  Filled 2017-04-28 (×2): qty 1

## 2017-04-28 MED ORDER — POTASSIUM CHLORIDE CRYS ER 20 MEQ PO TBCR
30.0000 meq | EXTENDED_RELEASE_TABLET | Freq: Two times a day (BID) | ORAL | Status: DC
  Administered 2017-04-28 (×2): 30 meq via ORAL
  Filled 2017-04-28 (×2): qty 1

## 2017-04-28 NOTE — Progress Notes (Signed)
Occupational Therapy Session Note  Patient Details  Name: Kayla Wyatt MRN: [id number] Date of Birth: December 25, 1955  Today's Date: 04/28/2017 OT Individual Time: 6503-5465 and 1500-1530 OT Individual Time Calculation (min): 60 min and 30 min   Short Term Goals: Week 1:  OT Short Term Goal 1 (Week 1): STGs = LTGs  Skilled Therapeutic Interventions/Progress Updates:    1) Treatment session with focus on activity tolerance and self-care retraining with bathing at shower level.  Pt received on toilet upon arrival, reporting multiple bouts of diarrhea (RN aware).  Completed bathing at sit > stand level in room shower from tub bench with setup for items.  Engaged in discussion regarding energy conservation strategies during bathing with pt reporting understanding.  Pt completed dressing with setup assist and assist to don TEDS.  Grooming tasks completed in standing for increased activity tolerance.  O2 sats 94% on room air after shower, increased to 97% after brief rest.  Pt reports mild SOB but demonstrates good breathing strategies throughout.  2) Treatment session with focus on activity tolerance and dynamic balance during home making tasks.  Pt received seated EOB, having just had IV placed.  Pt ambulated to therapy gym with RW, demonstrating good endurance.  Engaged in obstacle course involving obtaining items from floor while navigating obstacles.  Retrieved items from high and low cabinets to challenge balance while discussing carryover to homemaking tasks with meal prep, washing dishes, putting away groceries.  Pt utilized walker bag to transport various items as necessary to increase safety.  Returned to room and transferred to toilet with RW with supervision.  Left with brother present to provide supervision upon completion of toileting (as checked off 2/17).  Therapy Documentation Precautions:  Precautions Precautions: Fall, Other (comment) Precaution Comments: seixure Restrictions Weight  Bearing Restrictions: No Pain:  Pt with no c/o pain  See Function Navigator for Current Functional Status.   Therapy/Group: Individual Therapy  Simonne Come 04/28/2017, 9:48 AM

## 2017-04-28 NOTE — Progress Notes (Signed)
[name] PHYSICAL MEDICINE & REHABILITATION     PROGRESS NOTE  Subjective/Complaints:  Patient states she slept fairly overnight due to diarrhea. She notes that started yesterday after having her evening meal and has persisted.  ROS: +Diarrhea. Denies nausea, vomiting, shortness of breath or chest pain   Objective: Vital Signs: Blood pressure (!) 128/38, pulse 63, temperature 98.2 F (36.8 C), temperature source Oral, resp. rate 20, height 5\' 3"  (1.6 m), weight 62.5 kg (137 lb 12.8 oz), SpO2 98 %. No results found. Recent Labs    04/27/17 0748 04/28/17 0457  WBC 5.1 5.1  HGB 9.3* 8.5*  HCT 29.4* 26.5*  PLT 223 197   Recent Labs    04/27/17 0748  NA 141  K 3.2*  CL 108  GLUCOSE 102*  BUN 13  CREATININE 0.77  CALCIUM 8.8*   CBG (last 3)  Recent Labs    04/27/17 1158 04/27/17 1712 04/28/17 0700  GLUCAP 78 93 85    Wt Readings from Last 3 Encounters:  04/28/17 62.5 kg (137 lb 12.8 oz)  04/22/17 62 kg (136 lb 11 oz)    Physical Exam:  BP (!) 128/38 (BP Location: Right Arm)   Pulse 63   Temp 98.2 F (36.8 C) (Oral)   Resp 20   Ht 5\' 3"  (1.6 m)   Wt 62.5 kg (137 lb 12.8 oz)   SpO2 98%   BMI 24.41 kg/m  Constitutional: She appears well-developed. No distress.  HENT: Normocephalic and atraumatic.  Eyes: EOM are normal. No discharge.  Cardiovascular: Irregularly irregular. No JVD. Respiratory: Clear. Unlabored. GI: BS +, non-tender  Musculoskeletal: She exhibits no edema or tenderness.  Neurological: She is alert and oriented.  Motor: 4/5 grossly throughout (improving) Skin: Skin is warm and dry. She is not diaphoretic.  Stage II decub on sacrum with yellow eschar and surrounding erythema, not examined today.   Psychiatric: Her speech is normal and behavior is normal.   Assessment/Plan: 1. Functional deficits secondary to PRES which require 3+ hours per day of interdisciplinary therapy in a comprehensive inpatient rehab setting. Physiatrist is  providing close team supervision and 24 hour management of active medical problems listed below. Physiatrist and rehab team continue to assess barriers to discharge/monitor patient progress toward functional and medical goals.  Function:  Bathing Bathing position   Position: Other (comment)(w/c and standing at sink)  Bathing parts Body parts bathed by patient: Right arm, Left arm, Chest, Abdomen, Front perineal area, Buttocks, Right upper leg, Left upper leg, Right lower leg, Left lower leg, Back Body parts bathed by helper: Back  Bathing assist Assist Level: Supervision or verbal cues      Upper Body Dressing/Undressing Upper body dressing   What is the patient wearing?: Bra, Pull over shirt/dress Bra - Perfomed by patient: Thread/unthread right bra strap, Hook/unhook bra (pull down sports bra), Thread/unthread left bra strap   Pull over shirt/dress - Perfomed by patient: Thread/unthread right sleeve, Thread/unthread left sleeve, Put head through opening, Pull shirt over trunk          Upper body assist Assist Level: Set up, Supervision or verbal cues   Set up : To obtain clothing/put away  Lower Body Dressing/Undressing Lower body dressing   What is the patient wearing?: Underwear, Pants, [employer], Shoes Underwear - Performed by patient: Thread/unthread right underwear leg, Thread/unthread left underwear leg, Pull underwear up/down   Pants- Performed by patient: Thread/unthread right pants leg, Thread/unthread left pants leg, Pull pants up/down  Shoes - Performed by patient: Don/doff right shoe, Don/doff left shoe         TED Hose - Performed by helper: Don/doff right TED hose, Don/doff left TED hose  Lower body assist Assist for lower body dressing: Supervision or verbal cues      Toileting Toileting   Toileting steps completed by patient: Adjust clothing prior to toileting, Performs perineal hygiene, Adjust clothing after toileting Toileting steps  completed by helper: Adjust clothing prior to toileting, Adjust clothing after toileting, Performs perineal hygiene Toileting Assistive Devices: Grab bar or rail  Toileting assist Assist level: Supervision or verbal cues   Transfers Chair/bed transfer   Chair/bed transfer method: Ambulatory Chair/bed transfer assist level: Supervision or verbal cues Chair/bed transfer assistive device: Armrests, Medical sales representative     Max distance: 200 ft Assist level: Supervision or verbal cues   Wheelchair   Type: Manual Max wheelchair distance: 300 ft Assist Level: Supervision or verbal cues  Cognition Comprehension Comprehension assist level: Understands complex 90% of the time/cues 10% of the time  Expression Expression assist level: Expresses complex 90% of the time/cues < 10% of the time  Social Interaction Social Interaction assist level: Interacts appropriately 90% of the time - Needs monitoring or encouragement for participation or interaction.  Problem Solving Problem solving assist level: Solves basic problems with no assist  Memory Memory assist level: More than reasonable amount of time    Medical Problem List and Plan: 1.  Deficits with mobility, endurance, self-care secondary to PRES   Cont CIR 2.  RLE DVT:    Treated with IVC filter. No anticoagulation-- due to significant bleed on Xarelto.  3. Pain Management: tylenol prn 4. Mood: team to provide ego support. LCSW to follow for evaluation and support.  5. Neuropsych: This patient is not fully capable of making decisions on her own behalf. 6. Skin/Wound Care: Air mattress overlay for sacral decub with MASD. Added santyl with damp to dry dressing. Add vitamins as well as protein supplements to help promote wound healing.  7. Fluids/Electrolytes/Nutrition: Monitor I/Os. Added supplements with meals.  8. Gastric ulcer/GIB with hemorrhagic shock/ABLA:  Has been treated with 6 units PRBC.    Hemoglobin at 8.5 on  2/19   Added fiber and probiotic to help bulk up stools 9. Mediastinal mass: Will need to follow up on outpatient basis  10. AKI:  Has resolved. Continue to encourage adequate intake.  11. PRES with new onset seizures: Continue Keppra bid--follow MRI brain in 2 weeks (~2/28) 12. Abdominal pain: see above.  13. Hypokalemia: Has been persistent.    Added supplement, increased on 2/19.    Potassium 3.2 on 2/18   Labs ordered for tomorrow   Continue to monitor 14. Hypoxia: Continue nebulizers--encourage use bid.     Wean supplemental oxgen as tolerated.  15. Hypocalcemia: Added CaCO3 for oral supplement.    Calcium 8.8 on 2/18   Continue to monitor   16. Hyperglycemia: Resolved   HbA1c within normal limits on 2/18, CBGs DC'd 17. Hypomagnesemia   Supplement initiated on 2/14   Magnesium 1.6 on 2/18   IV magnesium 2 days, by mouth supplement increased on 2/19 18. Hypoalbuminemia   Supplement initiated on 2/14  LOS (Days) 6 A FACE TO FACE EVALUATION WAS PERFORMED  Kayla Wyatt Lorie Phenix 04/28/2017 8:29 AM

## 2017-04-28 NOTE — Progress Notes (Signed)
Occupational Therapy Session Note  Patient Details  Name: Kayla Wyatt MRN: [id number] Date of Birth: Oct 04, 1955  Today's Date: 04/28/2017 OT Individual Time: 1000-1045 OT Individual Time Calculation (min): 45 min    Short Term Goals: Week 1:  OT Short Term Goal 1 (Week 1): STGs = LTGs  Skilled Therapeutic Interventions/Progress Updates:    Pt received in recliner stating she was very tired but agreeable to trying to do some more therapy.  Pt ambulated with RW to ADL apt to sit on low couch, rested for a few minutes, then worked on standing with folding clothes and hanging clothes on hangers in closet, then sat at EOB to rest for nursing to give her medication. Pt then ambulated to sit in low recliner. Discussed home laundry strategies with energy conservation and ways to transport the laundry.  Pt does make her bed at home but felt too tired to try that now.  Pt ambulated back to room to use toilet.  She was washing hands at sink when she had sudden abdominal pain and she rushed back to the bathroom. Pt did have some diarrhea but stated she felt better. She cleansed hands and then transferred back to bed. Pt resting in bed with all needs met.  Therapy Documentation Precautions:  Precautions Precautions: Fall, Other (comment) Precaution Comments: seixure Restrictions Weight Bearing Restrictions: No      Pain:no c/o pain   ADL: ADL ADL Comments: refer to functional navigator  See Function Navigator for Current Functional Status.   Therapy/Group: Individual Therapy  [place] 04/28/2017, 12:40 PM

## 2017-04-28 NOTE — Progress Notes (Signed)
Physical Therapy Note  Patient Details  Name: Kayla Wyatt MRN: [id number] Date of Birth: Nov 04, 1955 Today's Date: 04/28/2017    Time: 830-924 54 minutes  1:1 No c/o pain.  Pt very fatigued from previous OT session but agreeable to treatment with frequent rest breaks.  Gait throughout unit in controlled environment for activity tolerance with distant supervision with RW.  Stair negotiation to simulate step down into living room at home with pt able to perform with RW with supervision.  Gait backward and sidestepping with RW for balance training with min guard.  Standing balance on foam with perturbations with min A for LOB, UE and LE therex on foam with min A for balance without UE support.  Gait withtou AD for balance training with min A 2 x 50'.  Furniture transfers with supervision to couch and recliner and toilet.   Kayla Wyatt 04/28/2017, 9:26 AM

## 2017-04-29 ENCOUNTER — Inpatient Hospital Stay (HOSPITAL_COMMUNITY): Payer: BLUE CROSS/BLUE SHIELD

## 2017-04-29 ENCOUNTER — Inpatient Hospital Stay (HOSPITAL_COMMUNITY): Payer: BLUE CROSS/BLUE SHIELD | Admitting: Physical Therapy

## 2017-04-29 ENCOUNTER — Encounter (HOSPITAL_COMMUNITY): Payer: BLUE CROSS/BLUE SHIELD | Admitting: *Deleted

## 2017-04-29 ENCOUNTER — Inpatient Hospital Stay (HOSPITAL_COMMUNITY): Payer: BLUE CROSS/BLUE SHIELD | Admitting: Occupational Therapy

## 2017-04-29 DIAGNOSIS — K909 Intestinal malabsorption, unspecified: Secondary | ICD-10-CM

## 2017-04-29 LAB — CBC WITH DIFFERENTIAL/PLATELET
Basophils Absolute: 0.1 10*3/uL (ref 0.0–0.1)
Basophils Relative: 1 %
Eosinophils Absolute: 0.1 10*3/uL (ref 0.0–0.7)
Eosinophils Relative: 3 %
HCT: 28.5 % — ABNORMAL LOW (ref 36.0–46.0)
Hemoglobin: 8.8 g/dL — ABNORMAL LOW (ref 12.0–15.0)
Lymphocytes Relative: 37 %
Lymphs Abs: 1.7 10*3/uL (ref 0.7–4.0)
MCH: 29.7 pg (ref 26.0–34.0)
MCHC: 30.9 g/dL (ref 30.0–36.0)
MCV: 96.3 fL (ref 78.0–100.0)
Monocytes Absolute: 0.4 10*3/uL (ref 0.1–1.0)
Monocytes Relative: 9 %
Neutro Abs: 2.2 10*3/uL (ref 1.7–7.7)
Neutrophils Relative %: 50 %
Platelets: 187 10*3/uL (ref 150–400)
RBC: 2.96 MIL/uL — ABNORMAL LOW (ref 3.87–5.11)
RDW: 16.2 % — ABNORMAL HIGH (ref 11.5–15.5)
WBC: 4.5 10*3/uL (ref 4.0–10.5)

## 2017-04-29 LAB — BASIC METABOLIC PANEL
Anion gap: 10 (ref 5–15)
BUN: 11 mg/dL (ref 6–20)
CO2: 23 mmol/L (ref 22–32)
Calcium: 8.3 mg/dL — ABNORMAL LOW (ref 8.9–10.3)
Chloride: 109 mmol/L (ref 101–111)
Creatinine, Ser: 0.69 mg/dL (ref 0.44–1.00)
GFR calc Af Amer: >60 mL/min (ref >60)
GFR calc non Af Amer: >60 mL/min (ref >60)
Glucose, Bld: 90 mg/dL (ref 65–99)
Potassium: 3.7 mmol/L (ref 3.5–5.1)
Sodium: 142 mmol/L (ref 135–145)

## 2017-04-29 MED ORDER — MAGNESIUM OXIDE 400 (241.3 MG) MG PO TABS
200.0000 mg | ORAL_TABLET | Freq: Two times a day (BID) | ORAL | Status: DC
  Administered 2017-04-29 – 2017-04-30 (×3): 200 mg via ORAL
  Filled 2017-04-29 (×3): qty 1

## 2017-04-29 MED ORDER — POTASSIUM CHLORIDE CRYS ER 20 MEQ PO TBCR
20.0000 meq | EXTENDED_RELEASE_TABLET | Freq: Two times a day (BID) | ORAL | Status: DC
  Administered 2017-04-29 – 2017-04-30 (×3): 20 meq via ORAL
  Filled 2017-04-29 (×3): qty 1

## 2017-04-29 MED ORDER — ENSURE ENLIVE PO LIQD
237.0000 mL | Freq: Every day | ORAL | Status: DC
  Administered 2017-04-29 – 2017-04-30 (×2): 237 mL via ORAL

## 2017-04-29 MED ORDER — CALCIUM POLYCARBOPHIL 625 MG PO TABS
1250.0000 mg | ORAL_TABLET | Freq: Two times a day (BID) | ORAL | Status: DC
  Administered 2017-04-29 – 2017-04-30 (×2): 1250 mg via ORAL
  Filled 2017-04-29 (×2): qty 2

## 2017-04-29 NOTE — Progress Notes (Signed)
Occupational Therapy Session Note  Patient Details  Name: Kayla Wyatt MRN: [id number] Date of Birth: [birth year]  Today's Date: 04/29/2017 OT Individual Time: 1478-2956 OT Individual Time Calculation (min): 57 min    Short Term Goals: Week 1:  OT Short Term Goal 1 (Week 1): STGs = LTGs  Skilled Therapeutic Interventions/Progress Updates:    Treatment session with focus on activity tolerance and dynamic balance.  Pt received upright in recliner reporting need to toilet.  Ambulated to toilet with RW and completed toilet transfer and toileting at Mod I level.  Engaged in simple housekeeping tasks with obtaining items from various surfaces and putting them on shelves and cabinets to simulate home environment.  Engaged in horse shoe toss activity to challenge balance, ambulating short distance without RW when retrieving thrown horseshoes with close supervision.  Mod I with ambulation with RW, supervision when ambulating without.  Pt tolerated standing 5-8 mins at a time during horse shoe activity.  Engaged in [place] bowling with pt able to tolerate standing 10 mins before seated rest break with no decrease in O2 sats.  Ambulated back to room >200 feet with RW at Mod I level.  Pt made Mod I in room, RN notified.  Therapy Documentation Precautions:  Precautions Precautions: Fall Precaution Comments: seixure Restrictions Weight Bearing Restrictions: No General:   Vital Signs: Therapy Vitals Temp: 98.3 F (36.8 C) Temp Source: Oral Pulse Rate: 65 Resp: 18 BP: (!) 113/44 Patient Position (if appropriate): Sitting Oxygen Therapy SpO2: 95 % O2 Device: Not Delivered Pain: Pain Assessment Pain Assessment: No/denies pain ADL: ADL ADL Comments: refer to functional navigator  See Function Navigator for Current Functional Status.   Therapy/Group: Individual Therapy  Simonne Come 04/29/2017, 3:01 PM

## 2017-04-29 NOTE — Progress Notes (Signed)
[place] PHYSICAL MEDICINE & REHABILITATION     PROGRESS NOTE  Subjective/Complaints:  Patient seen lying in bed this morning. She states she slept fairly overnight due to loose stools. She believes the magnesium is causing loose stools.  ROS: + Loose stools. Denies nausea, vomiting, shortness of breath or chest pain   Objective: Vital Signs: Blood pressure 125/67, pulse 65, temperature 97.9 F (36.6 C), temperature source Oral, resp. rate 18, height 5\' 3"  (1.6 m), weight 62.5 kg (137 lb 12.8 oz), SpO2 98 %. No results found. Recent Labs    04/28/17 0457 04/29/17 0539  WBC 5.1 4.5  HGB 8.5* 8.8*  HCT 26.5* 28.5*  PLT 197 187   Recent Labs    04/27/17 0748 04/29/17 0539  NA 141 142  K 3.2* 3.7  CL 108 109  GLUCOSE 102* 90  BUN 13 11  CREATININE 0.77 0.69  CALCIUM 8.8* 8.3*   CBG (last 3)  Recent Labs    04/27/17 1158 04/27/17 1712 04/28/17 0700  GLUCAP 78 93 85    Wt Readings from Last 3 Encounters:  04/28/17 62.5 kg (137 lb 12.8 oz)  04/22/17 62 kg (136 lb 11 oz)    Physical Exam:  BP 125/67 (BP Location: Right Arm)   Pulse 65   Temp 97.9 F (36.6 C) (Oral)   Resp 18   Ht 5\' 3"  (1.6 m)   Wt 62.5 kg (137 lb 12.8 oz)   SpO2 98%   BMI 24.41 kg/m  Constitutional: She appears well-developed. No distress.  HENT: Normocephalic and atraumatic.  Eyes: EOM are normal. No discharge.  Cardiovascular: Irregularly irregular. No JVD. Respiratory: Clear. Unlabored. GI: BS +, non-tender  Musculoskeletal: She exhibits no edema or tenderness.  Neurological: She is alert and oriented.  Motor: 4+/5 bilateral upper extremities 4/5 bilateral hip flexion, 4+/5 knee extension, ankle dorsi/plantar flexion Skin: Skin is warm and dry. She is not diaphoretic.  Stage II decub on sacrum with yellow eschar and surrounding erythema, not examined today.   Psychiatric: Her speech is normal and behavior is normal.   Assessment/Plan: 1. Functional deficits secondary to PRES  which require 3+ hours per day of interdisciplinary therapy in a comprehensive inpatient rehab setting. Physiatrist is providing close team supervision and 24 hour management of active medical problems listed below. Physiatrist and rehab team continue to assess barriers to discharge/monitor patient progress toward functional and medical goals.  Function:  Bathing Bathing position   Position: Shower  Bathing parts Body parts bathed by patient: Right arm, Left arm, Chest, Abdomen, Front perineal area, Buttocks, Right upper leg, Left upper leg, Right lower leg, Left lower leg, Back Body parts bathed by helper: Back  Bathing assist Assist Level: Supervision or verbal cues      Upper Body Dressing/Undressing Upper body dressing   What is the patient wearing?: Bra, Pull over shirt/dress Bra - Perfomed by patient: Thread/unthread right bra strap, Hook/unhook bra (pull down sports bra), Thread/unthread left bra strap   Pull over shirt/dress - Perfomed by patient: Thread/unthread right sleeve, Thread/unthread left sleeve, Put head through opening, Pull shirt over trunk          Upper body assist Assist Level: Set up   Set up : To obtain clothing/put away  Lower Body Dressing/Undressing Lower body dressing   What is the patient wearing?: Underwear, Pants, [employer], Shoes Underwear - Performed by patient: Thread/unthread right underwear leg, Thread/unthread left underwear leg, Pull underwear up/down   Pants- Performed by patient:  Thread/unthread right pants leg, Thread/unthread left pants leg, Pull pants up/down           Shoes - Performed by patient: Don/doff right shoe, Don/doff left shoe         TED Hose - Performed by helper: Don/doff right TED hose, Don/doff left TED hose  Lower body assist Assist for lower body dressing: Set up   Set up : Don/doff TED stockings, To obtain clothing/put away  Toileting Toileting   Toileting steps completed by patient: Adjust clothing prior  to toileting, Performs perineal hygiene, Adjust clothing after toileting Toileting steps completed by helper: Adjust clothing prior to toileting, Adjust clothing after toileting, Performs perineal hygiene Toileting Assistive Devices: Grab bar or rail  Toileting assist Assist level: Supervision or verbal cues   Transfers Chair/bed transfer   Chair/bed transfer method: Ambulatory Chair/bed transfer assist level: Supervision or verbal cues Chair/bed transfer assistive device: Armrests, Medical sales representative     Max distance: 200 ft Assist level: Supervision or verbal cues   Wheelchair   Type: Manual Max wheelchair distance: 300 ft Assist Level: Supervision or verbal cues  Cognition Comprehension Comprehension assist level: Understands complex 90% of the time/cues 10% of the time  Expression Expression assist level: Expresses complex 90% of the time/cues < 10% of the time  Social Interaction Social Interaction assist level: Interacts appropriately 90% of the time - Needs monitoring or encouragement for participation or interaction.  Problem Solving Problem solving assist level: Solves basic problems with no assist  Memory Memory assist level: More than reasonable amount of time    Medical Problem List and Plan: 1.  Deficits with mobility, endurance, self-care secondary to PRES   Cont CIR 2.  RLE DVT:    Treated with IVC filter. No anticoagulation-- due to significant bleed on Xarelto.  3. Pain Management: tylenol prn 4. Mood: team to provide ego support. LCSW to follow for evaluation and support.  5. Neuropsych: This patient is not fully capable of making decisions on her own behalf. 6. Skin/Wound Care: Air mattress overlay for sacral decub with MASD. Added santyl with damp to dry dressing. Add vitamins as well as protein supplements to help promote wound healing.  7. Fluids/Electrolytes/Nutrition: Monitor I/Os. Added supplements with meals.  8. Gastric ulcer/GIB with  hemorrhagic shock/ABLA:  Has been treated with 6 units PRBC.    Hemoglobin at 8.8 on 2/20   Added fiber and probiotic to help bulk up stools 9. Mediastinal mass: Will need to follow up on outpatient basis  10. AKI:  Has resolved. Continue to encourage adequate intake.  11. PRES with new onset seizures: Continue Keppra bid--follow MRI brain in 2 weeks (~2/28). 12. Abdominal pain: see above. 13. Hypokalemia: Has been persistent.    Added supplement, increased on 2/19, decreased on 2/20.    Potassium 3.7 on 2/20   Continue to monitor 14. Hypoxia: Continue nebulizers--encourage use bid.     Wean supplemental oxgen as tolerated.  15. Hypocalcemia: Added CaCO3 for oral supplement.    Calcium 8.3 on 2/20   Continue to monitor   16. Hyperglycemia: Resolved   HbA1c within normal limits on 2/18, CBGs DC'd 17. Hypomagnesemia   Supplement initiated on 2/14, decreased on 2/20 due to diarrhea   Magnesium 1.6 on 2/18   IV magnesium 2 days   Labs ordered for tomorrow 18. Hypoalbuminemia   Supplement initiated on 2/14  LOS (Days) 7 A FACE TO FACE EVALUATION WAS PERFORMED  Kayla Wyatt  Kayla Wyatt 04/29/2017 7:34 AM

## 2017-04-29 NOTE — Progress Notes (Signed)
Occupational Therapy Discharge Summary  Patient Details  Name: Kayla Wyatt MRN: [id number] Date of Birth: 1955/04/22  Patient has met 9 of 9 long term goals due to improved activity tolerance, improved balance, postural control, ability to compensate for deficits and improved awareness.  Patient to discharge at overall Modified Independent level.  Patient's care partner is independent to provide the necessary assistance at discharge.    Reasons goals not met: N/A  Recommendation:  Patient will benefit from ongoing skilled OT services in home health setting to continue to advance functional skills in the area of BADL and Reduce care partner burden.  Equipment: No equipment provided  Reasons for discharge: treatment goals met and discharge from hospital  Patient/family agrees with progress made and goals achieved: Yes  OT Discharge Precautions/Restrictions  Precautions Precautions: Fall Restrictions Weight Bearing Restrictions: No General   Vital Signs Therapy Vitals Temp: 98.3 F (36.8 C) Temp Source: Oral Pulse Rate: 65 Resp: 18 BP: (!) 113/44 Patient Position (if appropriate): Sitting Oxygen Therapy SpO2: 95 % O2 Device: Not Delivered Pain Pain Assessment Pain Assessment: No/denies pain ADL ADL ADL Comments: refer to functional navigator Vision Baseline Vision/History: Wears glasses Wears Glasses: At all times Patient Visual Report: No change from baseline Vision Assessment?: No apparent visual deficits Perception  Perception: Within Functional Limits Praxis Praxis: Intact Cognition Overall Cognitive Status: Within Functional Limits for tasks assessed Arousal/Alertness: Awake/alert Orientation Level: Oriented X4 Memory: Appears intact Safety/Judgment: Appears intact Sensation Sensation Light Touch: Appears Intact Stereognosis: Appears Intact Hot/Cold: Appears Intact Proprioception: Appears Intact Coordination Gross Motor Movements are Fluid and  Coordinated: Yes Fine Motor Movements are Fluid and Coordinated: Yes Extremity/Trunk Assessment RUE Assessment RUE Assessment: Within Functional Limits LUE Assessment LUE Assessment: Within Functional Limits   See Function Navigator for Current Functional Status.  Simonne Come 04/29/2017, 4:02 PM

## 2017-04-29 NOTE — Discharge Summary (Signed)
Physician Discharge Summary  Patient ID: Kayla Wyatt MRN: [id number] DOB/AGE: 05-10-1955 62 y.o.  Admit date: 04/22/2017 Discharge date: 04/30/2017  Discharge Diagnoses:  Principal Problem:   Critical illness myopathy Active Problems:   Acute blood loss anemia   Hypoalbuminemia due to protein-calorie malnutrition (HCC)   Hypomagnesemia   Hyperglycemia   Acute gastric ulcer   Diarrhea   Debility   Discharged Condition:stable   Significant Diagnostic Studies: N/A    Labs:  Basic Metabolic Panel: Recent Labs  Lab 04/27/17 0748 04/29/17 0539 04/30/17 0457  NA 141 142  --   K 3.2* 3.7  --   CL 108 109  --   CO2 23 23  --   GLUCOSE 102* 90  --   BUN 13 11  --   CREATININE 0.77 0.69  --   CALCIUM 8.8* 8.3*  --   MG 1.6*  --  2.0    CBC: Recent Labs  Lab 04/28/17 0457 04/29/17 0539 04/30/17 0457  WBC 5.1 4.5 5.0  NEUTROABS 3.0 2.2 2.3  HGB 8.5* 8.8* 8.6*  HCT 26.5* 28.5* 28.0*  MCV 96.4 96.3 96.6  PLT 197 187 195    CBG: Recent Labs  Lab 04/26/17 2039 04/27/17 0615 04/27/17 1158 04/27/17 1712 04/28/17 0700  GLUCAP 93 86 78 93 85    Brief HPI:   [name] a 62 y.o.femalewith history of HTN who was admitted on 04/02/17 with abdominal pain, hematemesis, melena and ABLA due to GIB.  She underwent clipping of gastric ulcer and received multiple units PRBC but continued to have drop in H/H with bloody stool. NM GI scan was negative for bleeding and she underwent colonoscopy by Dr. [name] 1/31 showing diverticulosis but no signs of bleeding. Hospital course significant for R- gastrocnemius DVT, PAF, incidental findings of mediastinal mass as well as brief episode of hematuria. She was cleared to start on Xarelto on 02/01 for treatment of DVT. On  2/2 am she developed hematemesis with maroon stools and hypotension requiring fluid bolus as well as 2 units PRBCs. She continued to decline requiring intubation, pressors as well as reversal of anticoagulation  with Kcentra on 02/3. She underwent UGI revealing large clot with fresh blood in stomach and underwent mesenteric arteriogram with percutaneous coil embolization fo distal tributary of left gastric artery and placement of IVC filter by interventional radiology.   A fib with RVR felt to be due to hemorrhagic shock and she converted to NSR. [name] felt no further cardiac work up indicated and did not recommend initiating anticoagulation in short term. To follow up with [name] after PET scan and routine biopsy after medically stable. She continued to have bleeding and underwent visceral angiogram with embolization of inferior division of splenic artery and associated gastric arteries on 2/4. No surgical intervention needed per [name] with recommendations to continue monitoring H/H as well as for recurrence of hematochezia. She tolerated extubation on 2/6 and respiratory status improving.   On 2/11, she has episode of unresponsiveness with jerking of bilateral limbs that lasted about 3 minutes. She had amnesia of events but was back to baseline.  EEG revealed "focal slowing over the right temporo-occipital regionandoccasional epileptiform discharges over the right occipital region". Head CT reviewed, unremarkable for acute intracranial process. MRI brain done revealing mild biparietal signal abnormality suspicious for PRES.  [name] recommended starting patient on Keppra BID with repeat MRI in 2 weeks. New onset seizure likely provoked by PRES--if resolved--Ok to take patient off  Keppra.  Patient with resultant generalized weakness. CIR recommended due to functional deficits.    Hospital Course: [name] was admitted to rehab 04/22/2017 for inpatient therapies to consist of PT and OT at least three hours five days a week. Past admission physiatrist, therapy team and rehab RN have worked together to provide customized collaborative inpatient rehab. Blood pressures have been stable  without medications. She continues in A Fib with heart rate controlled in 60-70 range. No chest pain or dyspnea reported with increase in activity. She does continue to have decreased activity tolerance and has been educated on energy conservation measures. ABLA has been stable overall. No signs of bleeding noted. She has had issues with hypokalemia as well as hypomagnesemia.   She was treated with IV dose of magnesium X 2 and labs at discharge showed Mg-2.0.  K dur was added for supplement and hypokalemia has resolved. Loose stools due ot IV mag Po intake is improving. Diarrhea due to magnesium was managed with addition of fiber and probiotic.  Air mattress overlay was ordered for stage II decub and santyl has been used for chemical debridement with wet to dry dressing. Nutritional supplements were added to help with low calorie malnutrition. Her respiratory status and endurance have improved. Nebulizers were transitioned to Symbicort at discharge. She has been seizure free during her stay. She has made good progress during her rehab stay and is modified independent at discharge. She will continue to receive follow up [place] [place] and [place] by [place] after discharge.    Rehab course: During patient's stay in rehab weekly team conferences were held to monitor patient's progress, set goals and discuss barriers to discharge. At admission, patient required min assist with ADL tasks and min to mod assist with mobility. Speech therapy evaluation showed intact cognitive linguistic function and speech clarity was improving therefore no ST needed during her stay.  She  has had improvement in activity tolerance, balance, postural control as well as ability to compensate for deficits.  She is able to complete ADL task at modified independent level . She is modified independent of tranfers and is able to ambulate 150' X 2 with RW. She requires supervision to climb stairs. She has been educated on energy  conservation measures and family education was completed with brother.     Disposition: 01-Home or Self Care  Diet: Low fat Low cholesterol.    Special Instructions: 1. Needs MRI brain repeated on/ after Feb 28 th. 2. Recheck CBC and BMET in 1-2 weeks. Discontinue Kdur if potassium levels stable.    Discharge Instructions    Ambulatory referral to Physical Medicine Rehab   Complete by:  As directed    1-2 weeks transitional care appt    2.    Allergies as of 04/30/2017   No Known Allergies     Medication List    STOP taking these medications   lisinopril-hydrochlorothiazide 20-12.5 MG tablet Commonly known as:  PRINZIDE,ZESTORETIC   simvastatin 20 MG tablet Commonly known as:  ZOCOR     TAKE these medications   acetaminophen 325 MG tablet Commonly known as:  TYLENOL Take 1-2 tablets (325-650 mg total) by mouth every 4 (four) hours as needed for mild pain.   budesonide-formoterol 80-4.5 MCG/ACT inhaler Commonly known as:  SYMBICORT Inhale 2 puffs into the lungs 2 (two) times daily.   cholecalciferol 1000 units tablet Commonly known as:  VITAMIN D Take 1,000 Units by mouth 2 (two) times daily.   collagenase  ointment Commonly known as:  SANTYL Apply topically daily. To sacral area with damp to dry dressing. Change daily Start taking on:  05/01/2017   levETIRAcetam 500 MG tablet Commonly known as:  KEPPRA Take 1 tablet (500 mg total) by mouth 2 (two) times daily.   lidocaine 5 % Commonly known as:  LIDODERM Place 1 patch onto the skin daily. Apply to lower back at 8 am and remove at 8 pm daily Start taking on:  05/01/2017   magnesium oxide 400 (241.3 Mg) MG tablet Commonly known as:  MAG-OX Take 0.5 tablets (200 mg total) by mouth 2 (two) times daily.   pantoprazole 40 MG tablet Commonly known as:  PROTONIX Take 1 tablet (40 mg total) by mouth 2 (two) times daily.   polycarbophil 625 MG tablet Commonly known as:  FIBERCON Take 2 tablets (1,250 mg total)  by mouth 2 (two) times daily.   potassium chloride SA 20 MEQ tablet Commonly known as:  K-DUR,KLOR-CON Take 1 tablet (20 mEq total) by mouth 2 (two) times daily.   saccharomyces boulardii 250 MG capsule Commonly known as:  FLORASTOR Take 1 capsule (250 mg total) by mouth 2 (two) times daily.      Follow-up Information    Jamse Arn, MD Follow up.   Specialty:  Physical Medicine and Rehabilitation Why:  office will call you with follow up appointment Contact information: [address] [place] [address] [phone]        Lelon Perla, MD. Call in 1 day(s).   Specialty:  Cardiology Why:  for follow up on A fib Contact information: [address] [place] [address] [phone]        Grace Isaac, MD Follow up on 05/01/2017.   Specialty:  Cardiothoracic Surgery Why:  They are supposed to contact you with appointment---call on Monday if you have not heard from them Contact information: [place] [address] [phone]        Carol Ada, MD. Call in 1 day(s).   Specialty:  Gastroenterology Why:  for follow up on GI bleed Contact information: [place], [place] [address] [phone]        [name], MD Follow up on 05/04/2017.   Specialty:  Family Medicine Why:  @ 3:00 pm (hospital follow up appt) Contact information: [place] [address] [phone]           Signed: Bary Leriche 04/30/2017, 5:06 PM

## 2017-04-29 NOTE — Progress Notes (Signed)
Physical Therapy Discharge Summary  Patient Details  Name: Kayla Wyatt MRN: [id number] Date of Birth: Jun 05, 1955  Today's Date: 04/29/2017 PT Individual Time: 9290-9030 and 1030-1055 PT Individual Time Calculation (min): 54 min  And 25 minutes  Session 1: Pt agreeable to therapy with no c/o pain. Pt c/o fatigue from being up all night in the bathroom. Pt able to perform household gait with RW, toilet transfers and toileting with mod I.  Car transfer to simulated sedan with mod I.  Curb and ramp negotiation with RW with supervision.  Gait in controlled environment 150' x 2 with mod I.  Stair negotiation x 12 stairs with bilat handrails with supervision.  Standing tolerance with wii bowling game, pt able to stand x 10 minutes to participate in game.  Pt left in room in bed due to fatigue.  Session 2: pt still with fatigue but agreeable to treatment with no c/o pain.   Pt transported to day room in w/c for energy conservation.  nustep x 7 minutes level 3 for activity tolerance and strengthening.  Pt performs household gait and toilet transfers with mod I, left in bathroom with RN aware.  Patient has met 8 of 8 long term goals due to improved activity tolerance, improved balance, increased strength and ability to compensate for deficits.  Patient to discharge at an ambulatory level Modified Independent.     Reasons goals not met: n/a  Recommendation:  Patient will benefit from ongoing skilled PT services in home health setting to continue to advance safe functional mobility, address ongoing impairments in balance, strength, endurance, and minimize fall risk.  Equipment: No equipment provided  Reasons for discharge: treatment goals met and discharge from hospital  Patient/family agrees with progress made and goals achieved: Yes  PT Discharge Precautions/Restrictions Precautions Precautions: Fall Restrictions Weight Bearing Restrictions: No Pain Pain Assessment Pain Assessment:  No/denies pain  Cognition Overall Cognitive Status: Within Functional Limits for tasks assessed Arousal/Alertness: Awake/alert Sensation Sensation Light Touch: (decreased bilat toes Rt > Lt) Proprioception: Appears Intact Coordination Gross Motor Movements are Fluid and Coordinated: Yes Fine Motor Movements are Fluid and Coordinated: Yes Motor  Motor Motor - Discharge Observations: generalized weakness   Trunk/Postural Assessment  Cervical Assessment Cervical Assessment: Within Functional Limits Thoracic Assessment Thoracic Assessment: (kyphotic) Lumbar Assessment Lumbar Assessment: Within Functional Limits Postural Control Postural Control: Within Functional Limits  Balance Static Sitting Balance Static Sitting - Level of Assistance: 7: Independent Dynamic Sitting Balance Dynamic Sitting - Level of Assistance: 7: Independent Static Standing Balance Static Standing - Level of Assistance: 6: Modified independent (Device/Increase time) Dynamic Standing Balance Dynamic Standing - Level of Assistance: 6: Modified independent (Device/Increase time) Extremity Assessment      RLE Strength RLE Overall Strength Comments: grossly 4-/5 LLE Strength LLE Overall Strength Comments: grossly 4-/5   See Function Navigator for Current Functional Status.  DONAWERTH,KAREN 04/29/2017, 9:25 AM

## 2017-04-29 NOTE — Progress Notes (Addendum)
Physical Therapy Note  Patient Details  Name: Kayla Wyatt MRN: [id number] Date of Birth: 08-24-1955 Today's Date: 04/29/2017  1520-1605, 45 min individual tx Pain: none per pt  Pt now modified independent in room. Gait training with RW on mulched surface and up/down ramps with supervision.  Discussed fall recovery at home, and PT demonstrated floor transfer.  Pt performed floor transfer to mat with min guard to descend to her hands and knees (no supine) and mod assist to get up to the mat. PT recommended that pt call 911 for fall recovery.  Balance challenge for neuro re-ed : standing on Airex mat with bil UE support fading to 0UE support, during R><L wt shifting, and in step position during diagonal wt shifting, bil UE support.   Pt limited by muscular endurance bil LE muscles. O2 sats 95-97% throughout session on room air, HR 55 - 89.  Pt left resting witting on edge of bed with all needs within reach; sister Blanch Media with her.  Pt consulted Pam, PA regarding HR; due to A. Fib.  Pt unable to tolerate Eliquis.    See function navigator for current status.  [name] 04/29/2017, 12:34 PM

## 2017-04-29 NOTE — Progress Notes (Signed)
Nutrition Follow-up  DOCUMENTATION CODES:   Not applicable  INTERVENTION:  Discontinue Boost Breeze.   Provide Ensure Enlive po once daily, each supplement provides 350 kcal and 20 grams of protein.  Provide 30 ml Prostat po BID, each supplement provides 100 kcal and 15 grams of protein.   Recommend continuation of nutritional supplements post discharge to aid in adequate caloric and protein needs.   NUTRITION DIAGNOSIS:   Increased nutrient needs related to acute illness as evidenced by estimated needs; ongoing  GOAL:   Patient will meet greater than or equal to 90% of their needs; met  MONITOR:   PO intake, Supplement acceptance, Labs, Weight trends, I & O's, Skin  REASON FOR ASSESSMENT:   Malnutrition Screening Tool    ASSESSMENT:   62 y.o. female with history of HTN who was admitted on 04/02/17 with abdominal pain, hematemesis, melena and ABLA due to GIB.  Meal completion has been mostly 75-100%. Pt reports having a good appetite with no other difficulties. Pt currently has Boost Breeze however has been refusing them recently as pt reports rather having Ensure instead. RD to modify orders. Pt additionally has Prostat to aid in healing and has been consuming them. Recommend continuation of nutritional supplementation upon discharge to aid in adequate nutrition needs.    Labs and medications reviewed.   NUTRITION - FOCUSED PHYSICAL EXAM:    Most Recent Value  Orbital Region  No depletion  Upper Arm Region  No depletion  Thoracic and Lumbar Region  No depletion  Buccal Region  No depletion  Temple Region  No depletion  Clavicle Bone Region  No depletion  Clavicle and Acromion Bone Region  No depletion  Scapular Bone Region  No depletion  Dorsal Hand  No depletion  Patellar Region  No depletion  Anterior Thigh Region  No depletion  Posterior Calf Region  No depletion  Edema (RD Assessment)  Mild  Hair  Reviewed  Eyes  Reviewed  Mouth  Reviewed  Skin  Reviewed   Nails  Reviewed       Diet Order:  DIET SOFT Room service appropriate? Yes; Fluid consistency: Thin Seizure precautions  EDUCATION NEEDS:   Not appropriate for education at this time  Skin:  Skin Assessment: Skin Integrity Issues: Skin Integrity Issues:: Stage II Stage II: sacrum  Last BM:  2/20  Height:   Ht Readings from Last 1 Encounters:  04/22/17 '5\' 3"'$  (1.6 m)    Weight:   Wt Readings from Last 1 Encounters:  04/28/17 137 lb 12.8 oz (62.5 kg)    Ideal Body Weight:  52.27 kg  BMI:  Body mass index is 24.41 kg/m.  Estimated Nutritional Needs:   Kcal:  1600-1800  Protein:  75-85 grams  Fluid:  1.6 - 1.8 L/day    Corrin Parker, MS, RD, LDN Pager # [phone] After hours/ weekend pager # [phone]

## 2017-04-30 ENCOUNTER — Encounter: Payer: BLUE CROSS/BLUE SHIELD | Admitting: Obstetrics & Gynecology

## 2017-04-30 DIAGNOSIS — R5381 Other malaise: Secondary | ICD-10-CM

## 2017-04-30 LAB — CBC WITH DIFFERENTIAL/PLATELET
Basophils Absolute: 0 10*3/uL (ref 0.0–0.1)
Basophils Relative: 1 %
Eosinophils Absolute: 0.1 10*3/uL (ref 0.0–0.7)
Eosinophils Relative: 3 %
HCT: 28 % — ABNORMAL LOW (ref 36.0–46.0)
Hemoglobin: 8.6 g/dL — ABNORMAL LOW (ref 12.0–15.0)
Lymphocytes Relative: 39 %
Lymphs Abs: 2 10*3/uL (ref 0.7–4.0)
MCH: 29.7 pg (ref 26.0–34.0)
MCHC: 30.7 g/dL (ref 30.0–36.0)
MCV: 96.6 fL (ref 78.0–100.0)
Monocytes Absolute: 0.5 10*3/uL (ref 0.1–1.0)
Monocytes Relative: 11 %
Neutro Abs: 2.3 10*3/uL (ref 1.7–7.7)
Neutrophils Relative %: 46 %
Platelets: 195 10*3/uL (ref 150–400)
RBC: 2.9 MIL/uL — ABNORMAL LOW (ref 3.87–5.11)
RDW: 16.4 % — ABNORMAL HIGH (ref 11.5–15.5)
WBC: 5 10*3/uL (ref 4.0–10.5)

## 2017-04-30 LAB — MAGNESIUM: Magnesium: 2 mg/dL (ref 1.7–2.4)

## 2017-04-30 MED ORDER — LIDOCAINE 5 % EX PTCH: 1.0000 | MEDICATED_PATCH | CUTANEOUS | 0 refills | Status: DC

## 2017-04-30 MED ORDER — MAGNESIUM OXIDE 400 (241.3 MG) MG PO TABS: 200.0000 mg | ORAL_TABLET | Freq: Two times a day (BID) | ORAL | 0 refills | Status: DC

## 2017-04-30 MED ORDER — BUDESONIDE 90 MCG/ACT IN AEPB: 1.0000 | INHALATION_SPRAY | Freq: Two times a day (BID) | RESPIRATORY_TRACT | 0 refills | Status: DC

## 2017-04-30 MED ORDER — BUDESONIDE-FORMOTEROL FUMARATE 80-4.5 MCG/ACT IN AERO: 2.0000 | INHALATION_SPRAY | Freq: Two times a day (BID) | RESPIRATORY_TRACT | 12 refills | Status: DC

## 2017-04-30 MED ORDER — POTASSIUM CHLORIDE CRYS ER 20 MEQ PO TBCR: 20.0000 meq | EXTENDED_RELEASE_TABLET | Freq: Two times a day (BID) | ORAL | 0 refills | Status: DC

## 2017-04-30 MED ORDER — PANTOPRAZOLE SODIUM 40 MG PO TBEC: 40.0000 mg | DELAYED_RELEASE_TABLET | Freq: Two times a day (BID) | ORAL | 0 refills | Status: DC

## 2017-04-30 MED ORDER — SACCHAROMYCES BOULARDII 250 MG PO CAPS: 250.0000 mg | ORAL_CAPSULE | Freq: Two times a day (BID) | ORAL | 0 refills | Status: DC

## 2017-04-30 MED ORDER — LEVETIRACETAM 500 MG PO TABS: 500.0000 mg | ORAL_TABLET | Freq: Two times a day (BID) | ORAL | 0 refills | Status: DC

## 2017-04-30 MED ORDER — CALCIUM POLYCARBOPHIL 625 MG PO TABS: 1250.0000 mg | ORAL_TABLET | Freq: Two times a day (BID) | ORAL | 0 refills | Status: DC

## 2017-04-30 MED ORDER — ACETAMINOPHEN 325 MG PO TABS: 325.0000 mg | ORAL_TABLET | ORAL | Status: AC | PRN

## 2017-04-30 MED ORDER — COLLAGENASE 250 UNIT/GM EX OINT: TOPICAL_OINTMENT | Freq: Every day | CUTANEOUS | 0 refills | Status: DC

## 2017-04-30 NOTE — Discharge Instructions (Signed)
Inpatient Rehab Discharge Instructions  Lorrayne Ismael Discharge date and time: 04/30/17   Activities/Precautions/ Functional Status: Activity: no lifting, driving, or strenuous exercise till cleared by MD. Diet: low fat, low cholesterol diet Wound Care: none needed   Functional status:  ___ No restrictions     ___ Walk up steps independently ___ 24/7 supervision/assistance   ___ Walk up steps with assistance _X__ Intermittent supervision/assistance  _X__ Bathe/dress independently ___ Walk with walker     ___ Bathe/dress with assistance ___ Walk Independently    ___ Shower independently ___ Walk with assistance    ___ Shower with assistance _X__ No alcohol     ___ Return to work/school ________     COMMUNITY REFERRALS UPON DISCHARGE:    Home Health:   PT     OT      RN                     Agency:  [facility] Phone: [phone]    Special Instructions: 1. NEED MRI BRAIN FOR FOLLOW UP AROUND 05/07/17   My questions have been answered and I understand these instructions. I will adhere to these goals and the provided educational materials after my discharge from the hospital.  Patient/Caregiver Signature _______________________________ Date __________  Clinician Signature _______________________________________ Date __________  Please bring this form and your medication list with you to all your follow-up doctor's appointments.

## 2017-04-30 NOTE — Patient Care Conference (Signed)
Inpatient RehabilitationTeam Conference and Plan of Care Update Date: 04/29/2017   Time: 2:45 PM    Patient Name: Kayla Wyatt      Medical Record Number: [id number]  Date of Birth: 04-25-1955 Sex: Female         Room/Bed: 4M05C/4M05C-01 Payor Info: Payor: BLUE CROSS BLUE SHIELD / Plan: BCBS OTHER / Product Type: *No Product type* /    Admitting Diagnosis: Critial Illness myopathy  Admit Date/Time:  04/22/2017  4:29 PM Admission Comments: No comment available   Primary Diagnosis:  <principal problem not specified> Principal Problem: <principal problem not specified>  Patient Active Problem List   Diagnosis Date Noted  . Debility   . Diarrhea   . Acute gastric ulcer   . Hypoxia   . Hypoalbuminemia due to protein-calorie malnutrition ([place])   . Hypomagnesemia   . Hyperglycemia   . Critical illness myopathy 04/22/2017  . PRES (posterior reversible encephalopathy syndrome)   . Acute deep vein thrombosis (DVT) of right lower extremity ([place])   . Pressure injury of sacral region, unstageable ([employer])   . Gastrointestinal hemorrhage with hematemesis   . Mediastinal mass   . Hypokalemia   . Hypocalcemia   . Supplemental oxygen dependent   . Encounter for intubation   . Respiratory failure ([place])   . Persistent atrial fibrillation with rapid ventricular response ([place]) 04/07/2017  . Gastrointestinal hemorrhage with melena   . Lower abdominal pain of unknown etiology   . Abnormal computed tomography angiography (CTA) of abdomen and pelvis   . Acute blood loss anemia 04/02/2017    Expected Discharge Date: Expected Discharge Date: 04/30/17  Team Members Present: Physician leading conference: Dr. Delice Lesch Social Worker Present: Lennart Pall, LCSW Nurse Present: Frances Maywood, RN PT Present: Roderic Ovens, PT OT Present: Simonne Come, OT SLP Present: Windell Moulding, SLP PPS Coordinator present : Daiva Nakayama, RN, CRRN     Current Status/Progress Goal Weekly Team Focus  Medical   Deficits with mobility, endurance, self-care secondary to PRES.  Improve mobility, safety, Mag, K+, loose stools  See above   Bowel/Bladder   S  decreased stool frequency and loose stools with PRN immodium; maintain bladder continence  assess q shift for changes/loose stools; offer immodium PRN   Swallow/Nutrition/ Hydration             ADL's   Setup - Mod I for bathing/dressing and toilet transfers  Mod I ADLs, Supervision IADLs  activity tolerance, endurance, pt/family education   Mobility   mod I  mod I  d/c planning   Communication             Safety/Cognition/ Behavioral Observations  a&ox4; family member checked off to transfer pt  maintain  call bell within reach; education   Pain   denies; has c/o abdominal pain in the past  maintain  assess for pain q shift and prn   Skin   wound to R groin OTA; sacral/coccyx wound unstageable  free of further breakdown/infection  daily dressing cx santyl, moist 2x2, dry 4x4, foam    Rehab Goals Patient on target to meet rehab goals: Yes *See Care Plan and progress notes for long and short-term goals.     Barriers to Discharge  Current Status/Progress Possible Resolutions Date Resolved   Physician    Medical stability;Other (comments)  Electrolytes  See above  Therapies, supplementing electrolytes, follow labs, improve bowels      Nursing  PT                    OT                  SLP                SW                Discharge Planning/Teaching Needs:  Pt to d/c home with brother who can provide any needed assistance.  NA - mod ind goals.   Team Discussion:  Making excellent progress and reaching mod in goals.  Ready for d/c tomorrow with [place] f/u.   Revisions to Treatment Plan:  None    Continued Need for Acute Rehabilitation Level of Care: The patient requires daily medical management by a physician with specialized training in physical medicine and rehabilitation for the following conditions: Daily  direction of a multidisciplinary physical rehabilitation program to ensure safe treatment while eliciting the highest outcome that is of practical value to the patient.: Yes Daily medical management of patient stability for increased activity during participation in an intensive rehabilitation regime.: Yes Daily analysis of laboratory values and/or radiology reports with any subsequent need for medication adjustment of medical intervention for : Neurological problems;Other  Dorethea Strubel 04/30/2017, 12:13 PM

## 2017-04-30 NOTE — Progress Notes (Signed)
Social Work  Discharge Note  The overall goal for the admission was met for:   Discharge location: Yes - home with brother who can provide any needed assistance  Length of Stay: Yes - 8 days  Discharge activity level: Yes - modified independent  Home/community participation: Yes  Services provided included: MD, RD, PT, OT, SLP, RN, TR, Pharmacy and [place]: Private Insurance: [place]  Follow-up services arranged: Home Health: RN, PT, OT via [facility] and Patient/Family has no preference for HH/DME agencies  Comments (or additional information):  Patient/Family verbalized understanding of follow-up arrangements: Yes  Individual responsible for coordination of the follow-up plan: pt  Confirmed correct DME delivered: NA    Kayla Wyatt

## 2017-04-30 NOTE — Progress Notes (Signed)
Pt. Got d/c papers and follow up appointments.Pt. Ready to go home with her brother.

## 2017-04-30 NOTE — Progress Notes (Signed)
[facility] PHYSICAL MEDICINE & REHABILITATION     PROGRESS NOTE  Subjective/Complaints:  Patient seen lying in bed this morning. She states she slept well overnight. She notes significant improvement loose stools. She is ready for discharge.  ROS: Denies nausea, vomiting, shortness of breath or chest pain   Objective: Vital Signs: Blood pressure (!) 127/41, pulse 69, temperature 98 F (36.7 C), temperature source Oral, resp. rate 18, height 5\' 3"  (1.6 m), weight 63.9 kg (140 lb 14 oz), SpO2 94 %. No results found. Recent Labs    04/29/17 0539 04/30/17 0457  WBC 4.5 5.0  HGB 8.8* 8.6*  HCT 28.5* 28.0*  PLT 187 195   Recent Labs    04/29/17 0539  NA 142  K 3.7  CL 109  GLUCOSE 90  BUN 11  CREATININE 0.69  CALCIUM 8.3*   CBG (last 3)  Recent Labs    04/27/17 1158 04/27/17 1712 04/28/17 0700  GLUCAP 78 93 85    Wt Readings from Last 3 Encounters:  04/30/17 63.9 kg (140 lb 14 oz)  04/22/17 62 kg (136 lb 11 oz)    Physical Exam:  BP (!) 127/41 (BP Location: Right Arm)   Pulse 69   Temp 98 F (36.7 C) (Oral)   Resp 18   Ht 5\' 3"  (1.6 m)   Wt 63.9 kg (140 lb 14 oz)   SpO2 94%   BMI 24.95 kg/m  Constitutional: She appears well-developed. No distress.  HENT: Normocephalic and atraumatic.  Eyes: EOM are normal. No discharge.  Cardiovascular: Irregularly irregular. No JVD. Respiratory: Clear. Unlabored. GI: BS +, non-tender  Musculoskeletal: She exhibits no edema or tenderness.  Neurological: She is alert and oriented.  Motor: 4+/5 bilateral upper extremities 4/5 bilateral hip flexion, 4+/5 knee extension, ankle dorsi/plantar flexion (improving) Skin: Skin is warm and dry. She is not diaphoretic.  Stage II decub on sacrum with yellow eschar and surrounding erythema, not examined today.   Psychiatric: Her speech is normal and behavior is normal.   Assessment/Plan: 1. Functional deficits secondary to PRES which require 3+ hours per day of interdisciplinary  therapy in a comprehensive inpatient rehab setting. Physiatrist is providing close team supervision and 24 hour management of active medical problems listed below. Physiatrist and rehab team continue to assess barriers to discharge/monitor patient progress toward functional and medical goals.  Function:  Bathing Bathing position   Position: Wheelchair/chair at sink  Bathing parts Body parts bathed by patient: Right arm, Left arm, Chest, Abdomen, Front perineal area, Buttocks, Right upper leg, Left upper leg, Right lower leg, Left lower leg, Back(per pt report) Body parts bathed by helper: Back  Bathing assist Assist Level: More than reasonable time(per pt report)      Upper Body Dressing/Undressing Upper body dressing   What is the patient wearing?: Bra, Pull over shirt/dress Bra - Perfomed by patient: Thread/unthread right bra strap, Hook/unhook bra (pull down sports bra), Thread/unthread left bra strap   Pull over shirt/dress - Perfomed by patient: Thread/unthread right sleeve, Thread/unthread left sleeve, Put head through opening, Pull shirt over trunk          Upper body assist Assist Level: More than reasonable time(per pt report)   Set up : To obtain clothing/put away  Lower Body Dressing/Undressing Lower body dressing   What is the patient wearing?: Underwear, Pants, [employer], Shoes Underwear - Performed by patient: Thread/unthread right underwear leg, Thread/unthread left underwear leg, Pull underwear up/down   Pants- Performed by patient:  Thread/unthread right pants leg, Thread/unthread left pants leg, Pull pants up/down           Shoes - Performed by patient: Don/doff right shoe, Don/doff left shoe         TED Hose - Performed by helper: Don/doff right TED hose, Don/doff left TED hose  Lower body assist Assist for lower body dressing: More than reasonable time(per pt report)   Set up : Don/doff TED stockings, To obtain clothing/put away  Toileting Toileting    Toileting steps completed by patient: Adjust clothing prior to toileting, Performs perineal hygiene, Adjust clothing after toileting Toileting steps completed by helper: Adjust clothing prior to toileting, Adjust clothing after toileting, Performs perineal hygiene Toileting Assistive Devices: Grab bar or rail  Toileting assist Assist level: Set up/obtain supplies   Transfers Chair/bed transfer   Chair/bed transfer method: Ambulatory Chair/bed transfer assist level: No Help, no cues, assistive device, takes more than a reasonable amount of time Chair/bed transfer assistive device: Armrests, Medical sales representative     Max distance: 200 ft Assist level: No help, No cues, assistive device, takes more than a reasonable amount of time   Wheelchair   Type: Manual Max wheelchair distance: 300 ft Assist Level: Supervision or verbal cues  Cognition Comprehension Comprehension assist level: Understands complex 90% of the time/cues 10% of the time  Expression Expression assist level: Expresses complex 90% of the time/cues < 10% of the time  Social Interaction Social Interaction assist level: Interacts appropriately 90% of the time - Needs monitoring or encouragement for participation or interaction.  Problem Solving Problem solving assist level: Solves basic problems with no assist  Memory Memory assist level: Recognizes or recalls 90% of the time/requires cueing < 10% of the time    Medical Problem List and Plan: 1.  Deficits with mobility, endurance, self-care secondary to PRES   DC today   Will see patient for transitional care management in 1-2 weeks 2.  RLE DVT:    Treated with IVC filter. No anticoagulation-- due to significant bleed on Xarelto.  3. Pain Management: tylenol prn 4. Mood: team to provide ego support. LCSW to follow for evaluation and support.  5. Neuropsych: This patient is not fully capable of making decisions on her own behalf. 6. Skin/Wound Care: Air  mattress overlay for sacral decub with MASD. Added santyl with damp to dry dressing. Add vitamins as well as protein supplements to help promote wound healing.  7. Fluids/Electrolytes/Nutrition: Monitor I/Os. Added supplements with meals.  8. Gastric ulcer/GIB with hemorrhagic shock/ABLA:  Has been treated with 6 units PRBC.    Hemoglobin at 8.6 on 2/21   Added fiber and probiotic to help bulk up stools 9. Mediastinal mass: Will need to follow up on outpatient basis  10. AKI:  Has resolved. Continue to encourage adequate intake.  11. PRES with new onset seizures: Continue Keppra bid--follow MRI brain in 2 weeks (~2/28). 12. Abdominal pain: see above. 13. Hypokalemia: Has been persistent.    Added supplement, increased on 2/19, decreased on 2/20.    Potassium 3.7 on 2/20   Continue to monitor 14. Hypoxia: Continue nebulizers--encourage use bid.     Wean supplemental oxgen as tolerated.  15. Hypocalcemia: Added CaCO3 for oral supplement.    Calcium 8.3 on 2/20   Continue to monitor   16. Hyperglycemia: Resolved   HbA1c within normal limits on 2/18, CBGs DC'd 17. Hypomagnesemia   Supplement initiated on 2/14, decreased on 2/20 due  to diarrhea   Magnesium 2.0 on 2/21   IV magnesium 2 days completed 18. Hypoalbuminemia   Supplement initiated on 2/14  LOS (Days) 8 A FACE TO FACE EVALUATION WAS PERFORMED  Elza Sortor Lorie Phenix 04/30/2017 8:44 AM

## 2017-05-01 ENCOUNTER — Telehealth: Payer: Self-pay | Admitting: *Deleted

## 2017-05-01 NOTE — Telephone Encounter (Signed)
Transitional Care call- 1st attempt 2:25 pm 05/01/17, No answer and not taking calls on Ms Cid phone, no answer on her brother's phone (who she is staying with), left message w/appt date and time on VM and to call office.   Appointment Monday 05/11/17 @ 9:20, arrive by 9:00 to see Kayla Sensing NP  [place]

## 2017-05-04 ENCOUNTER — Telehealth (HOSPITAL_COMMUNITY): Payer: Self-pay | Admitting: Physical Medicine and Rehabilitation

## 2017-05-04 DIAGNOSIS — R222 Localized swelling, mass and lump, trunk: Secondary | ICD-10-CM

## 2017-05-04 NOTE — Telephone Encounter (Signed)
  Transitional Care call  Transitional Care Call Completed, Appointment Confirmed, Address Confirmed, New Patient Packet Mailed  Patient name: Tellefsen HarrisDOB: [birth year] 1. Are you/is patient experiencing any problems since coming home? No a. Are there any questions regarding any aspect of care? NO 2. Are there any questions regarding medications administration/dosing? no a. Are meds being taken as prescribed? YES b. "Patient should review meds with caller to confirm" [place] List Reviewed 3. Have there been any falls? No 4. Has Home Health been to the house and/or have they contacted you? She has spoken to advance, call placed to [place] they will be contacting her on 05/05/2017 a. If not, have you tried to contact them? NA b. Can we help you contact them? No 5. Are bowels and bladder emptying properly? Yes, having diarrhea she's relating this to Magnesium Oxide. She's compliant with Fiber Con a. Are there any unexpected incontinence issues? No b. If applicable, is patient following bowel/bladder programs? NA 6. Any fevers, problems with breathing, unexpected pain? () 7. Are there any skin problems or new areas of breakdown? No a. Has the patient/family member arranged specialty MD follow up (ie cardiology/neurology/renal/surgical/etc.)?  She's waiting for an appointment with [name] she called Dr Servando Snare office and was instructed  she needs a referral. Spoke with PA from hospital, she will call [name] office.  b. Can we help arrange? See the above 8. Does the patient need any other services or support that we can help arrange? No 9. Are caregivers following through as expected in assisting the patient? Yes 10. Has the patient quit smoking, drinking alcohol, or using drugs as recommended? Denies smoking, drinking alcohol or illicit drugs.  Appointment date/time 05/11/2017, arrival time 9:00 for scheduled appointment at 09:20  with Bayard Hugger ANP-C [address]

## 2017-05-05 ENCOUNTER — Other Ambulatory Visit: Payer: Self-pay | Admitting: Cardiothoracic Surgery

## 2017-05-05 NOTE — Telephone Encounter (Addendum)
Referral placed to Dr. Liliane Shi office for follow up on lung mass.

## 2017-05-08 ENCOUNTER — Other Ambulatory Visit (HOSPITAL_COMMUNITY): Payer: Self-pay | Admitting: Family Medicine

## 2017-05-11 ENCOUNTER — Ambulatory Visit: Payer: BLUE CROSS/BLUE SHIELD | Admitting: Cardiothoracic Surgery

## 2017-05-11 ENCOUNTER — Inpatient Hospital Stay: Payer: BLUE CROSS/BLUE SHIELD | Admitting: Adult Health

## 2017-05-11 ENCOUNTER — Encounter: Payer: BLUE CROSS/BLUE SHIELD | Attending: Registered Nurse | Admitting: Registered Nurse

## 2017-05-11 ENCOUNTER — Encounter: Payer: Self-pay | Admitting: Cardiothoracic Surgery

## 2017-05-11 ENCOUNTER — Other Ambulatory Visit: Payer: Self-pay | Admitting: *Deleted

## 2017-05-11 ENCOUNTER — Encounter: Payer: Self-pay | Admitting: Registered Nurse

## 2017-05-11 ENCOUNTER — Other Ambulatory Visit: Payer: Self-pay

## 2017-05-11 VITALS — BP 137/62 | HR 60

## 2017-05-11 VITALS — BP 133/58 | HR 75 | Resp 18 | Ht 63.0 in | Wt 128.0 lb

## 2017-05-11 DIAGNOSIS — K259 Gastric ulcer, unspecified as acute or chronic, without hemorrhage or perforation: Secondary | ICD-10-CM | POA: Insufficient documentation

## 2017-05-11 DIAGNOSIS — Z7689 Persons encountering health services in other specified circumstances: Secondary | ICD-10-CM | POA: Diagnosis present

## 2017-05-11 DIAGNOSIS — R569 Unspecified convulsions: Secondary | ICD-10-CM | POA: Diagnosis not present

## 2017-05-11 DIAGNOSIS — E7800 Pure hypercholesterolemia, unspecified: Secondary | ICD-10-CM | POA: Diagnosis not present

## 2017-05-11 DIAGNOSIS — K921 Melena: Secondary | ICD-10-CM

## 2017-05-11 DIAGNOSIS — I481 Persistent atrial fibrillation: Secondary | ICD-10-CM | POA: Insufficient documentation

## 2017-05-11 DIAGNOSIS — E559 Vitamin D deficiency, unspecified: Secondary | ICD-10-CM | POA: Diagnosis not present

## 2017-05-11 DIAGNOSIS — I10 Essential (primary) hypertension: Secondary | ICD-10-CM | POA: Diagnosis not present

## 2017-05-11 DIAGNOSIS — Z9889 Other specified postprocedural states: Secondary | ICD-10-CM | POA: Diagnosis not present

## 2017-05-11 DIAGNOSIS — I6783 Posterior reversible encephalopathy syndrome: Secondary | ICD-10-CM

## 2017-05-11 DIAGNOSIS — G7281 Critical illness myopathy: Secondary | ICD-10-CM | POA: Diagnosis not present

## 2017-05-11 DIAGNOSIS — F17200 Nicotine dependence, unspecified, uncomplicated: Secondary | ICD-10-CM | POA: Insufficient documentation

## 2017-05-11 DIAGNOSIS — J9859 Other diseases of mediastinum, not elsewhere classified: Secondary | ICD-10-CM | POA: Diagnosis not present

## 2017-05-11 NOTE — Progress Notes (Signed)
Subjective:    Patient ID: Cam Harnden, female    DOB: May 02, 1955, 62 y.o.   MRN: [id number]  HPI: Ms. Alle Difabio is a 62 year old female who is her for transitional visit in follow up of her GIB she underwent clipping of gastric ulcer she received multiple units of PRBC, critical illness myopathy, PRES new onset of Seizures she was prescribed Keppra, she has been compliant.. When Ms. Foody was in the hospital she had A-Fib with RVR , she had converted to NSR, she has an appointment scheduled with her Cardiologist Dr Stanford Breed on March 12th. Has a follow up appointment with [name] this afternoon. She denies any pain  at this time. Also reports she has a good appetite, denies and bleeding.  Ms. Ilyas states she is not receiving home therapies, this provider placed a call to [place]. [facility] reports her care was discharged on  05/06/17, also reporting  Ms. Gerrard didn't receive any therapies. Ms. Donata Clay was informed regarding home therapy by PA on 05/08/2017.   In contact with Dr. Posey Pronto regarding the discharge instructions for repeat MRI per [name] recommendations. MRI with and without contrast will be ordered, if PRES resolved he stated she could be gradually tapered off Keppra. Ms. Ricklefs verbalizes understanding.   Pain Inventory Average Pain 0 Pain Right Now 0 My pain is none  In the last 24 hours, has pain interfered with the following? General activity 0 Relation with others 0 Enjoyment of life 0 What TIME of day is your pain at its worst? none Sleep (in general) Good  Pain is worse with: sitting Pain improves with: medication Relief from Meds: 8  Mobility walk without assistance use a cane do you drive?  no transfers alone Do you have any goals in this area?  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   yes  Neuro/Psych No problems in this area  Prior Studies Any changes since last visit?  no  Physicians involved in your care Any changes since last visit?  no   Family History  Problem Relation Age of Onset  . Heart disease Father    Social History   Socioeconomic History  . Marital status: Unknown    Spouse name: Not on file  . Number of children: Not on file  . Years of education: Not on file  . Highest education level: Not on file  Social Needs  . Financial resource strain: Not on file  . Food insecurity - worry: Not on file  . Food insecurity - inability: Not on file  . Transportation needs - medical: Not on file  . Transportation needs - non-medical: Not on file  Occupational History  . Occupation: Has to lift heavy boxes at times.    Employer: [employer]  Tobacco Use  . Smoking status: Current Every Day Smoker  . Smokeless tobacco: Never Used  Substance and Sexual Activity  . Alcohol use: No    Frequency: Never  . Drug use: No  . Sexual activity: Not on file  Other Topics Concern  . Not on file  Social History Narrative  . Not on file   Past Surgical History:  Procedure Laterality Date  . COLONOSCOPY Left 04/09/2017   Procedure: COLONOSCOPY;  Surgeon: Carol Ada, MD;  Location: [place];  Service: Endoscopy;  Laterality: Left;  . ESOPHAGOGASTRODUODENOSCOPY N/A 04/03/2017   Procedure: ESOPHAGOGASTRODUODENOSCOPY (EGD);  Surgeon: Carol Ada, MD;  Location: [place];  Service: Endoscopy;  Laterality: N/A;  . ESOPHAGOGASTRODUODENOSCOPY (EGD) WITH PROPOFOL N/A 04/12/2017   Procedure: ESOPHAGOGASTRODUODENOSCOPY (EGD) WITH PROPOFOL;  Surgeon: Milus Banister, MD;  Location: [facility] END[facility]COPY;  Service: Endoscopy;  Laterality: N/A;  . IR ANGIOGRAM SELECTIVE EACH ADDITIONAL VESSEL  04/12/2017  . IR ANGIOGRAM SELECTIVE EACH ADDITIONAL VESSEL  04/13/2017  . IR ANGIOGRAM SELECTIVE EACH ADDITIONAL VESSEL  04/13/2017  . IR ANGIOGRAM SELECTIVE EACH ADDITIONAL VESSEL   04/13/2017  . IR ANGIOGRAM VISCERAL SELECTIVE  04/12/2017  . IR ANGIOGRAM VISCERAL SELECTIVE  04/13/2017  . IR EMBO ART  VEN HEMORR LYMPH EXTRAV  INC GUIDE ROADMAPPING  04/12/2017  . IR EMBO ART  VEN HEMORR LYMPH EXTRAV  INC GUIDE ROADMAPPING  04/13/2017  . IR FLUORO GUIDE CV LINE RIGHT  04/13/2017  . IR IVC FILTER PLMT / S&I /IMG GUID/MOD SED  04/12/2017  . IR US GUIDE VASC ACCESS RIGHT  04/12/2017  . IR US GUIDE VASC ACCESS RIGHT  04/13/2017  . IR US GUIDE VASC ACCESS RIGHT  04/13/2017  . LEG SURGERY  1974   Blood Clot Removal    Past Medical History:  Diagnosis Date  . Acute blood loss anemia 04/02/2017  . Gastrointestinal hemorrhage with melena   . Hypercholesteremia   . Hypertension   . Persistent atrial fibrillation with rapid ventricular response ([place]) 04/07/2017  . Vitamin D deficiency    There were no vitals taken for this visit.  Opioid Risk Score:   Fall Risk Score:  `1  Depression screen PHQ 2/9  No flowsheet data found.  Review of Systems  Constitutional: Positive for unexpected weight change.  Respiratory: Positive for cough.   All other systems reviewed and are negative.      Objective:   Physical Exam  Constitutional: She is oriented to person, place, and time. She appears well-developed and well-nourished.  HENT:  Head: Normocephalic and atraumatic.  Neck: Normal range of motion. Neck supple.  Cardiovascular: Normal rate and regular rhythm.  Pulmonary/Chest: Effort normal and breath sounds normal.  Musculoskeletal:  Normal Muscle Bulk and Muscle Testing Reveals: Upper Extremities: Full ROM and Muscle Strength 5/5 Lower Extremities: Full ROM and Muscle Strength 5/5 Arises from Table with ease, using straight cane for support Narrow Based Gait  Neurological: She is alert and oriented to person, place, and time.  Skin: Skin is warm and dry.  Psychiatric: She has a normal mood and affect.  Nursing note and vitals reviewed.         Assessment & Plan:  1. GIB: S/P  Clipping of Gastric Ulcer: Dr. [name] Following 2. PRES/ Seizure: Continue Keppra: Repeat MRI with and without contrast will be ordered.  3. Critical Illness Myopathy: Continue to Monitor  30 minutes of face to face patient care time was spent during this visit. All questions were encouraged and answered.

## 2017-05-11 NOTE — Progress Notes (Signed)
[address]       [place] [address]             [phone]                    [name] Medical Record #[id number] Date of Birth: 01-Nov-1955  Referring: Bary Leriche, PA-C Primary Care: [name], MD Primary Cardiologist: No primary care provider on file.  Chief Complaint:    Chief Complaint  Patient presents with  . Mediastinal Mass    f/u Chest CT 1/29, ECHO 2/5    History of Present Illness:    Kayla Wyatt 62 y.o. female is seen in the office  today for follow-up after recent admission to the hospital for GI bleeding.  The patient was ultimately hospitalized for a month.  She had endoscopy done 3 times, colonoscopy done,  Embolization of the inferior division of the splenic artery for repeat GI bleed.  She also had a inferior vena caval filter placed.  She had episodes of delirium and myopathy during her hospitalization.  She has slowly improved is now walking with a cane.  Has had no more GI bleed .   In late January a CT scan of the chest was done which suggested enlarged AP window lymph node which is why she is been referred to the thoracic surgery office.  Since she was discharged from the hospital she has not smoked no more cigarettes.  Current Activity/ Functional Status:  Patient is independent with mobility/ambulation, transfers, ADL's, IADL's.   Zubrod Score: At the time of surgery this patient's most appropriate activity status/level should be described as: _0     0    Normal activity, no symptoms _1     1    Restricted in physical strenuous activity but ambulatory, able to do out light work _2     2    Ambulatory and capable of self care, unable to do work activities, up and about               >50 % of waking hours                              _3     3    Only limited self care, in bed greater than 50% of waking hours _4     4    Completely disabled, no self care, confined to bed or chair _5     5     Moribund   Past Medical History:  Diagnosis Date  . Acute blood loss anemia 04/02/2017  . Gastrointestinal hemorrhage with melena   . Hypercholesteremia   . Hypertension   . Persistent atrial fibrillation with rapid ventricular response ([place]) 04/07/2017  . Vitamin D deficiency     Past Surgical History:  Procedure Laterality Date  . COLONOSCOPY Left 04/09/2017   Procedure: COLONOSCOPY;  Surgeon: Carol Ada, MD;  Location: [facility];  Service: Endoscopy;  Laterality: Left;  . ESOPHAGOGASTRODUODENOSCOPY N/A 04/03/2017   Procedure: ESOPHAGOGASTRODUODENOSCOPY (EGD);  Surgeon: Carol Ada, MD;  Location: [facility];  Service: Endoscopy;  Laterality: N/A;  . ESOPHAGOGASTRODUODENOSCOPY (EGD) WITH PROPOFOL N/A 04/12/2017   Procedure: ESOPHAGOGASTRODUODENOSCOPY (EGD) WITH PROPOFOL;  Surgeon: Milus Banister, MD;  Location: [facility] ENDOSCOPY;  Service: Endoscopy;  Laterality: N/A;  . IR ANGIOGRAM SELECTIVE EACH ADDITIONAL VESSEL  04/12/2017  . IR ANGIOGRAM SELECTIVE EACH ADDITIONAL VESSEL  04/13/2017  . IR  ANGIOGRAM SELECTIVE EACH ADDITIONAL VESSEL  04/13/2017  . IR ANGIOGRAM SELECTIVE EACH ADDITIONAL VESSEL  04/13/2017  . IR ANGIOGRAM VISCERAL SELECTIVE  04/12/2017  . IR ANGIOGRAM VISCERAL SELECTIVE  04/13/2017  . IR EMBO ART  VEN HEMORR LYMPH EXTRAV  INC GUIDE ROADMAPPING  04/12/2017  . IR EMBO ART  VEN HEMORR LYMPH EXTRAV  INC GUIDE ROADMAPPING  04/13/2017  . IR FLUORO GUIDE CV LINE RIGHT  04/13/2017  . IR IVC FILTER PLMT / S&I /IMG GUID/MOD SED  04/12/2017  . IR US GUIDE VASC ACCESS RIGHT  04/12/2017  . IR US GUIDE VASC ACCESS RIGHT  04/13/2017  . IR US GUIDE VASC ACCESS RIGHT  04/13/2017  . LEG SURGERY  1974   Blood Clot Removal     Family History  Problem Relation Age of Onset  . Heart disease Father     Social History   Socioeconomic History  . Marital status: Unknown    Spouse name: Not on file  . Number of children: Not on file  . Years of education: Not on file  . Highest education level: Not  on file  Social Needs  . Financial resource strain: Not on file  . Food insecurity - worry: Not on file  . Food insecurity - inability: Not on file  . Transportation needs - medical: Not on file  . Transportation needs - non-medical: Not on file  Occupational History  . Occupation: Has to lift heavy boxes at times.    Employer: [employer]  Tobacco Use  . Smoking status: Former Smoker    Packs/day: 0.50    Years: 40.00    Pack years: 20.00    Types: Cigarettes    Last attempt to quit: 03/31/2017    Years since quitting: 0.1  . Smokeless tobacco: Never Used  Substance and Sexual Activity  . Alcohol use: No    Frequency: Never  . Drug use: No  . Sexual activity: Not Currently  Other Topics Concern  . Not on file  Social History Narrative  . Not on file    Social History   Tobacco Use  Smoking Status Former Smoker  . Packs/day: 0.50  . Years: 40.00  . Pack years: 20.00  . Types: Cigarettes  . Last attempt to quit: 03/31/2017  . Years since quitting: 0.1  Smokeless Tobacco Never Used    Social History   Substance and Sexual Activity  Alcohol Use No  . Frequency: Never     No Known Allergies  Current Outpatient Medications  Medication Sig Dispense Refill  . acetaminophen (TYLENOL) 325 MG tablet Take 1-2 tablets (325-650 mg total) by mouth every 4 (four) hours as needed for mild pain.    . budesonide-formoterol (SYMBICORT) 80-4.5 MCG/ACT inhaler Inhale 2 puffs into the lungs 2 (two) times daily. 1 Inhaler 12  . cholecalciferol (VITAMIN D) 1000 units tablet Take 1,000 Units by mouth 2 (two) times daily.    . collagenase (SANTYL) ointment Apply topically daily. To sacral area with damp to dry dressing. Change daily 15 g 0  . levETIRAcetam (KEPPRA) 500 MG tablet Take 1 tablet (500 mg total) by mouth 2 (two) times daily. 60 tablet 0  . magnesium oxide (MAG-OX) 400 (241.3 Mg) MG tablet Take 0.5 tablets (200 mg total) by mouth 2 (two) times daily. 60 tablet 0  .  pantoprazole (PROTONIX) 40 MG tablet Take 1 tablet (40 mg total) by mouth 2 (two) times daily. 60 tablet 0  . polycarbophil (FIBERCON) 625  MG tablet Take 2 tablets (1,250 mg total) by mouth 2 (two) times daily. 120 tablet 0  . potassium chloride SA (K-DUR,KLOR-CON) 20 MEQ tablet Take 1 tablet (20 mEq total) by mouth 2 (two) times daily. 60 tablet 0  . saccharomyces boulardii (FLORASTOR) 250 MG capsule Take 1 capsule (250 mg total) by mouth 2 (two) times daily. 100 capsule 0   No current facility-administered medications for this visit.       Review of Systems:     Cardiac Review of Systems: [Y] = yes  or   [  ] = no   Chest Pain [  n  ]  Resting SOB [ y ] Exertional SOB  Blue.Reese  ]  Orthopnea [ y ]   Pedal Edema [  n ]    Palpitations [ n ] Syncope  [ n ]   Presyncope [ n  ]  General Review of Systems: [Y] = yes [  ]=no Constitional: recent weight change Blue.Reese  ];  Wt loss over the last 3 months [  61 ] anorexia [ y ]; fatigue [ y ]; nausea [ y ]; night sweats [n  ]; fever [ n ]; or chills [  n];          Dental: poor dentition[ n ]; Last Dentist visit:   Eye : blurred vision [ n ]; diplopia [   ]; vision changes [ n ];  Amaurosis fugax[  ]; Resp: cough [ y ];  wheezing[  ];  hemoptysis[ n ]; shortness of breath[ y ]; paroxysmal nocturnal dyspnea[  ]; dyspnea on exertion[  ]; or orthopnea[  ];  GI:  gallstones[n  ], vomiting[ny ];  dysphagia[  ]; melena[  ];  hematochezia [ y ]; heartburn[  ];   Hx of  Colonoscopy[ y ]; GU: kidney stones [  ]; hematuria[n  ];   dysuria [  n];  nocturia[  ];  history of     obstruction [n  ]; urinary frequency [  ]             Skin: rash, swelling[  ];, hair loss[  ];  peripheral edema[  ];  or itching[  ]; Musculosketetal: myalgias[n  ];  joint swelling[ n ];  joint erythema[  ];  joint pain[n  ];  back pain[  ];  Heme/Lymph: bruising[  ];  bleeding[  ];  anemia[  ];  Neuro: TIA[n  ];  headaches[ n ];  stroke[n  ];  vertigo[  ];  seizures[ y ];   paresthesias[  ];   difficulty walking[ y ];  Psych:depression[  ]; anxiety[  ];  Endocrine: diabetes[  ];  thyroid dysfunction[  ];  Immunizations: Flu up to date Blue.Reese  ]; Pneumococcal up to date [ y ];  Other:  Physical Exam: BP (!) 133/58 (BP Location: Left Arm, Patient Position: Sitting, Cuff Size: Normal)   Pulse 75   Resp 18   Ht _0  (1.6 m)   Wt 128 lb (58.1 kg)   SpO2 95% Comment: RA  BMI 22.67 kg/m   PHYSICAL EXAMINATION: General appearance: alert, cooperative, appears older than stated age and no distress Head: Normocephalic, without obvious abnormality, atraumatic Neck: no adenopathy, no carotid bruit, no JVD, supple, symmetrical, trachea midline and thyroid not enlarged, symmetric, no tenderness/mass/nodules Lymph nodes: Cervical, supraclavicular, and axillary nodes normal. Resp: clear to auscultation bilaterally Back: symmetric, no curvature. ROM normal. No CVA tenderness. Cardio: regular rate and  rhythm, S1, S2 normal, no murmur, click, rub or gallop GI: soft, non-tender; bowel sounds normal; no masses,  no organomegaly Extremities: extremities normal, atraumatic, no cyanosis or edema Neurologic: Grossly normal ambulates with a cane  Diagnostic Studies & Laboratory data:     Recent Radiology Findings:   Ct Abdomen Pelvis Wo Contrast  Result Date: 04/13/2017 CLINICAL DATA:  Evaluate for retroperitoneal hematoma status post arterial access. EXAM: CT ABDOMEN AND PELVIS WITHOUT CONTRAST TECHNIQUE: Multidetector CT imaging of the abdomen and pelvis was performed following the standard protocol without IV contrast. COMPARISON:  04/03/2017 FINDINGS: Lower chest: Moderate bilateral pleural effusions with overlying compressive type atelectasis. Hepatobiliary: No focal liver abnormality. Vicarious excretion of contrast material into the gallbladder is noted. Pancreas: Unremarkable. No pancreatic ductal dilatation or surrounding inflammatory changes. Spleen: Normal in size without focal abnormality.  Adrenals/Urinary Tract: Normal adrenal glands. There is a striated nephrographic appearance of both kidneys, right greater than left. IV contrast material is identified within a dilated left renal collecting system and ureter. Asymmetrically diminished contrast within the right renal collecting system identified. Urinary bladder collapsed around a Foley catheter. Stomach/Bowel: There is a gastrostomy tube with tip in the distal stomach. Moderate to increased attenuation material is identified within the gastric lumen, image 17 of series 3. There also high attenuation material is identified within the small and large bowel loops. Vascular/Lymphatic: Aortic atherosclerosis. No aneurysm. Filter identified within the IVC. There is no adenopathy. Reproductive: Status post hysterectomy. No adnexal masses. Other: There is a small volume of ascites identified. Small to moderate volume of ascites within the right upper quadrant of the abdomen and right lower quadrant of the abdomen and pelvis. There is diffuse body wall edema compatible with anasarca. No retroperitoneal hematoma identified. Musculoskeletal: No acute or significant osseous findings. IMPRESSION: 1. No evidence for retroperitoneal hematoma. 2. Striated nephrographic appearance of both kidneys identified, right greater than left. Imaging findings are favored to represent sequelae of acute tubular necrosis. Not excluded but favored less likely would be an embolic phenomenon resulting in areas of renal infarct. 3. Left-sided hydronephrosis and hydroureter. Etiology indeterminate. Cannot rule out distal ureteral obstruction. 4. Bilateral pleural effusions, ascites and diffuse body wall edema compatible with fluid overload. 5. Intermediate to high attenuation material within the stomach. Although conceivably this could be related to it administration of enteric contrast material ongoing bleeding within the gastric lumen is not excluded. 6.  Aortic Atherosclerosis  (ICD10-I70.0). Electronically Signed   By: Kerby Moors M.D.   On: 04/13/2017 12:00   Ct Head Wo Contrast  Result Date: 04/19/2017 CLINICAL DATA:  Seizure activity. EXAM: CT HEAD WITHOUT CONTRAST TECHNIQUE: Contiguous axial images were obtained from the base of the skull through the vertex without intravenous contrast. COMPARISON:  None. FINDINGS: Brain: No acute infarct, hemorrhage, or mass lesion is present. The ventricles are of normal size. No significant extraaxial fluid collection is present. Brainstem and cerebellum are normal. Vascular: Vascular calcifications are present within the right cavernous internal carotid artery. There is no hyperdense vessel. Skull: Calvarium is intact. No focal lytic or blastic lesions are present. No significant extracranial soft tissue injury is present. Sinuses/Orbits: The paranasal sinuses and mastoid air cells are clear. Globes and orbits are within normal limits. IMPRESSION: 1. Atherosclerosis. 2. Otherwise negative CT of the head. Electronically Signed   By: San Morelle M.D.   On: 04/19/2017 18:09   Mr Jeri Cos EU Contrast  Result Date: 04/22/2017 CLINICAL DATA:  Altered mental status, minimally responsive.  Seizure like symptoms. EXAM: MRI HEAD WITHOUT AND WITH CONTRAST TECHNIQUE: Multiplanar, multiecho pulse sequences of the brain and surrounding structures were obtained without and with intravenous contrast. CONTRAST:  12m MULTIHANCE GADOBENATE DIMEGLUMINE 529 MG/ML IV SOLN COMPARISON:  CT HEAD April 19, 2017 FINDINGS: Mildly motion degraded examination. INTRACRANIAL CONTENTS: No reduced diffusion to suggest acute ischemia or status epilepticus. No susceptibility artifact to suggest hemorrhage. The ventricles and sulci are normal for patient's age. Small area faint bilateral parietal FLAIR T2 hyperintense signal. No midline shift, mass effect or masses. No abnormal extra-axial fluid collections. No extra-axial masses. No abnormal intraparenchymal or  extra-axial enhancement. VASCULAR: Normal major intracranial vascular flow voids present at skull base. SKULL AND UPPER CERVICAL SPINE: No abnormal sellar expansion. No suspicious calvarial bone marrow signal. Craniocervical junction maintained. SINUSES/ORBITS: Bilateral mastoid effusions.The included ocular globes and orbital contents are non-suspicious. OTHER: Patient is edentulous. IMPRESSION: 1. Motion degraded examination. 2. 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 3. Otherwise negative MRI of the head with and without contrast for age. Electronically Signed   By: CElon AlasM.D.   On: 04/22/2017 00:34   Ir Angiogram Visceral Selective  Result Date: 04/13/2017 INDICATION: 62year old female with gastric ulcer and a visible bleeding vessel. Patient underwent embolization yesterday but continues to demonstrate evidence of upper GI bleeding. Repeat angiography is requested. EXAM: IR EMBO ART VEN HEMORR LYMPH EXTRAV INC GUIDE ROADMAPPING; ARTERIOGRAPHY; IR ULTRASOUND GUIDANCE VASC ACCESS RIGHT; SELECTIVE VISCERAL ARTERIOGRAPHY; ADDITIONAL ARTERIOGRAPHY; IR RIGHT FLOURO GUIDE CV LINE 1. Ultrasound-guided access right common femoral artery 2. Catheterization and angiogram of the left gastric artery 3. Catheterization and angiogram the celiac axis 4. Catheterization and angiogram of the splenic artery 5. Catheterization and angiogram of the superior division of the splenic artery 6. Catheterization and angiogram of the inferior division of the splenic artery 7. Coil embolization of the inferior division of the splenic artery MEDICATIONS: None ANESTHESIA/SEDATION: Moderate (conscious) sedation was employed during this procedure. A total of Versed 1 mg was administered intravenously. The patient was on a fentanyl drip. Moderate Sedation Time: 60 minutes. The patient's level of consciousness and vital signs were monitored continuously by radiology nursing  throughout the procedure under my direct supervision. CONTRAST:  80 mL Isovue 300 FLUOROSCOPY TIME:  Fluoroscopy Time: 13 minutes 6 seconds (1299 mGy). COMPLICATIONS: None immediate. PROCEDURE: Informed consent was obtained from the patient following explanation of the procedure, risks, benefits and alternatives. The patient understands, agrees and consents for the procedure. All questions were addressed. A time out was performed prior to the initiation of the procedure. Maximal barrier sterile technique utilized including caps, mask, sterile gowns, sterile gloves, large sterile drape, hand hygiene, and Betadine prep. The right common femoral artery was interrogated with ultrasound and found to be widely patent. An image was obtained and stored for the medical record. Local anesthesia was attained by infiltration with 1% lidocaine. A small dermatotomy was made. Under real-time sonographic guidance, the vessel was punctured with a 21 gauge micropuncture needle. Using standard technique, the initial micro needle was exchanged over a 0.018 micro wire for a transitional 4 [place] sheath. The micro sheath was then exchanged over a 0.035 wire for a 5 French vascular sheath. A C2 cobra catheter was advanced over a Bentson wire into the abdominal aorta. The catheter was used to select the left gastric artery which arises directly from the aorta. Catheter angiography was performed. Successful coil embolization of the left gastric artery on the prior  angiogram. There is no significant perfusion in the region of the vascular clip. The C2 cobra catheter was then advanced into the celiac artery and a celiac arteriogram was performed. The vessels are significantly attenuated consistent with diffuse vaso spasm. Branches of the short gastric arteries are identified in the region of the previously placed endoscopic clip. Using a glidewire, the 5 [place] catheter was successfully advanced into the splenic artery. Angiography was  then performed in multiple obliquities. The splenic artery divides into a superior and inferior division. Both divisions give rise to some a short gastric arteries. A Renegade high flow microcatheter was successfully advanced into the distal splenic artery and then into the superior division. Superior division short gastric arteries appear to provide blood flow more superiorly along the gastric fundus. The microcatheter was then successfully navigated into the inferior division of the splenic artery. The short gastric arteries are in close proximity to the endovascular clip. No definite active extravasation was identified. The decision was made to perform coil embolization. This will likely devascularize approximately 30-40% of the lower pole of the spleen. Coil embolization was performed using a combination of 2 and 3 mm interlock and vortex coils. Post embolization arteriography demonstrates complete cessation of flow in the arteries in the region of the previously placed endovascular clip. The catheters were removed. Hemostasis was attained with the assistance of an ExoSeal extra arterial vascular plug. IMPRESSION: 1. Successful prior coil embolization of the left gastric artery. 2. Coil embolization of the inferior division of the splenic artery and is associated short gastric arteries in the region of the previously placed endovascular clip. Angiography and embolization therapy has now been maximized. If the patient continues to hemorrhage, repeat endoscopy or surgery would likely be required. Signed, Criselda Peaches, MD [facility] Electronically Signed   By: Jacqulynn Cadet M.D.   On: 04/13/2017 19:09   Ir Angiogram Visceral Selective  Result Date: 04/12/2017 INDICATION: History of recent upper GI bleed, post endoscopy approximately 1 week ago resulting in placement of an endoscopy clip at the location of a bleeding gastric ulcer. Patient  subsequently found to have a lower extremity DVT and was started on anti coagulation (patient has received 2 doses of Xarelto). Unfortunately, this resulted in a recurrent large upper GI bleed. Repeat upper endoscopy demonstrates a large amount of blood products within the stomach with inability to achieve hemostasis from endoscopic approach. As such, request made for emergent mesenteric arteriogram and potential percutaneous coil embolization as well as placement of an IVC filter for temporary caval interruption purposes. EXAM: 1. ULTRASOUND GUIDANCE FOR VENOUS AND ARTERIAL ACCESS 2. SELECTIVE CELIAC ARTERIOGRAM. 3. SELECTIVE ACCESSORY LEFT GASTRIC ARTERIOGRAM. 4. SELECTIVE ARTERIOGRAM OF DISTAL TRIBUTARY OF THE LEFT GASTRIC ARTERY (3rd ORDER) AND PERCUTANEOUS COIL EMBOLIZATION 5. IVC CATHETERIZATION AND VENOGRAM 6. IVC FILTER INSERTION COMPARISON:  CTA of the abdomen pelvis - 04/03/2017; chest CT - 04/07/2017 MEDICATIONS: None. ANESTHESIA/SEDATION: Versed 58 mg IV Sedation Time: minutes; The patient was continuously monitored during the procedure by the interventional radiology nurse under my direct supervision. CONTRAST:  75 cc Isovue 300 FLUOROSCOPY TIME:  9 Minutes 24 seconds (179 mGy) COMPLICATIONS: None immediate. PROCEDURE: Informed written consent was obtained from the patient's brother following explanation of the procedure, risks, benefits and alternatives. A time out was performed prior to the initiation of the procedure. The right groin was prepped and draped in usual sterile fashion. Maximal barrier sterile technique utilized including caps, mask, sterile gowns, sterile gloves,  large sterile drape, hand hygiene, and chlorhexidine prep. The right femoral head was marked fluoroscopically. Under ultrasound guidance, the right common femoral artery was accessed with a micropuncture kit after the overlying soft tissues were anesthetized with 1% lidocaine. An ultrasound image was saved for documentation  purposes. The micropuncture sheath was exchanged for a 6 [place] vascular sheath over a Bentson wire. (Note, a 6 [place] vascular sheath was selected for access given suspected hemodynamically significant stenosis within the mid aspect of the abdominal aorta and potential necessity for angioplasty.) A closure arteriogram was performed through the side of the sheath confirming access within the right common femoral artery, however the 6 [place] vascular sheath was noted to be near occlusive secondary to a combination of atherosclerotic plaque as well as the vasospasm. With the use of a Kumpe catheter, a Bentson wire was advanced to the level of the caudal aspect of the thoracic aorta. Limited abdominal aortogram was performed demonstrating patency of the abdominal aorta with the 5 French catheter in place and as such abdominal aortic angioplasty would not be necessary and the decision was made to downsized the vascular sheath a 5 [place]. Again, closure arteriogram was performed demonstrating preferential flow through the right internal iliac artery with atretic flow surrounding the vascular sheath. Given patient's critical state the decision was made to proceed with the mesenteric arteriogram and embolization in an expeditious manner in lieu of abandoning the femoral access for a brachial approach. Over a Bentson wire, a Mickelson catheter was advanced to the level of the thoracic aorta where it was back bled and flushed. The catheter was then utilized to select the celiac artery and a selective celiac arteriogram was performed The Mickelson catheter was then utilized to select the left gastric artery and a selective left gastric arteriogram was performed. With the use of an 0.014 fathom microwire, a regular Renegade microcatheter was advanced into a distal tributary of the left gastric artery serving as the predominant arterial supply to the tangle of vessels regional to the endoscopy clip. Selective injection  confirmed appropriate positioning. The distal aspect of the branch vessel was then percutaneously coil embolized with multiple overlapping 2 mm and 3 mm soft and regular interlock coils. The microcatheter was withdrawn into the central aspect of the left gastric artery and a post embolization arteriogram was performed. The microcatheter was removed and a completion left gastric arteriogram was performed via the Mickelson catheter Again, the Mickelson catheter was utilized to select the celiac artery and selective celiac arteriograms were performed in various obliquities. Images were reviewed and the arteriogram portion of the procedure was terminated. Completion closure arteriogram was performed. All wires, catheters and sheaths were removed from the patient. Hemostasis was achieved at the right groin access site with deployment of a ExoSeal closure device and manual compression. _________________________________________________________ Next, attention was made towards placement of the IVC filter. Under sterile condition and local anesthesia, right common femoral venous access was performed with ultrasound. An ultrasound image was saved and sent to PACS. Over a guidewire, the IVC filter delivery sheath and inner dilator were advanced into the IVC just above the IVC bifurcation. Contrast injection was performed for an IVC venogram. Through the delivery sheath, a retrievable Denali IVC filter was deployed below the level of the renal veins and above the IVC bifurcation. Limited post deployment venacavagram was performed. The delivery sheath was removed and hemostasis was obtained with manual compression. A dressing was placed. The patient tolerated the procedure well without immediate post  procedural complication. FINDINGS: Selective celiac arteriogram demonstrates non conventional branching pattern without supply to the left gastric artery or significant supply to the gastric fundus, as was demonstrated on preceding  CTA. As such, the accessory left gastric artery arising directly from the abdominal aorta was selected with sub selective arteriogram demonstrating a tangle of blood vessels contributing arterial supply to the portion of the stomach containing the endoscopy clip. Caudal division of the accessory left gastric artery supplies the dominant arterial contribution to the portion of the stomach containing the endoscopy clip and as such, was percutaneously coil embolized with multiple overlapping 2 and 3 mm interlock coils to near the vessel's origin. Completion arteriogram demonstrates a technically excellent result with no definitive persistent arterial supply regional to the endoscopy clip. Following the percutaneous coil embolization, several additional selective arteriograms were performed in various obliquities, again confirming no definitive supply to the gastric fundus. As such, additional embolization was not performed. The IVC is patent. No evidence of thrombus, stenosis, or occlusion. No variant venous anatomy. Successful placement of the IVC filter below the level of the renal veins. IMPRESSION: 1. Technically successful percutaneous coil embolization of the caudal division of the accessory left gastric artery serving as the predominant arterial supply to the portion of the stomach containing the endoscopy clip. 2. Selective celiac arteriogram fails to delineate significant arterial supply to the gastric fundus and in particular, to the portion of stomach regional to the endoscopy clip. 3. Successful ultrasound and fluoroscopically guided placement of an infrarenal retrievable IVC filter. PLAN: - Patient is to lie flat for 4 hours. Continued aggressive resuscitation as per the critical care team. Note, as the patient has experienced an acute large bleeding episode, I would expect the patient's H and H has not yet reached a nadir but hopefully will stabilize in the next 24-48 hours. - The IVC filter is  potentially retrievable. The patient will be assessed for filter retrieval by Interventional Radiology in approximately 8-12 weeks. Further recommendations regarding filter retrieval, continued surveillance or declaration of device permanence, will be made at that time. Electronically Signed   By: Sandi Mariscal M.D.   On: 04/12/2017 07:04   Ir Angiogram Selective Each Additional Vessel  Result Date: 04/14/2017 INDICATION: 62 year old female with gastric ulcer and a visible bleeding vessel. Patient underwent embolization yesterday but continues to demonstrate evidence of upper GI bleeding. Repeat angiography is requested. EXAM: IR EMBO ART VEN HEMORR LYMPH EXTRAV INC GUIDE ROADMAPPING; ARTERIOGRAPHY; IR ULTRASOUND GUIDANCE VASC ACCESS RIGHT; SELECTIVE VISCERAL ARTERIOGRAPHY; ADDITIONAL ARTERIOGRAPHY; IR RIGHT FLOURO GUIDE CV LINE 1. Ultrasound-guided access right common femoral artery 2. Catheterization and angiogram of the left gastric artery 3. Catheterization and angiogram the celiac axis 4. Catheterization and angiogram of the splenic artery 5. Catheterization and angiogram of the superior division of the splenic artery 6. Catheterization and angiogram of the inferior division of the splenic artery 7. Coil embolization of the inferior division of the splenic artery MEDICATIONS: None ANESTHESIA/SEDATION: Moderate (conscious) sedation was employed during this procedure. A total of Versed 1 mg was administered intravenously. The patient was on a fentanyl drip. Moderate Sedation Time: 60 minutes. The patient's level of consciousness and vital signs were monitored continuously by radiology nursing throughout the procedure under my direct supervision. CONTRAST:  80 mL Isovue 300 FLUOROSCOPY TIME:  Fluoroscopy Time: 13 minutes 6 seconds (1299 mGy). COMPLICATIONS: None immediate. PROCEDURE: Informed consent was obtained from the patient following explanation of the procedure, risks, benefits and alternatives. The  patient understands,  agrees and consents for the procedure. All questions were addressed. A time out was performed prior to the initiation of the procedure. Maximal barrier sterile technique utilized including caps, mask, sterile gowns, sterile gloves, large sterile drape, hand hygiene, and Betadine prep. The right common femoral artery was interrogated with ultrasound and found to be widely patent. An image was obtained and stored for the medical record. Local anesthesia was attained by infiltration with 1% lidocaine. A small dermatotomy was made. Under real-time sonographic guidance, the vessel was punctured with a 21 gauge micropuncture needle. Using standard technique, the initial micro needle was exchanged over a 0.018 micro wire for a transitional 4 [place] micro sheath. The micro sheath was then exchanged over a 0.035 wire for a 5 French vascular sheath. A C2 cobra catheter was advanced over a Bentson wire into the abdominal aorta. The catheter was used to select the left gastric artery which arises directly from the aorta. Catheter angiography was performed. Successful coil embolization of the left gastric artery on the prior angiogram. There is no significant perfusion in the region of the vascular clip. The C2 cobra catheter was then advanced into the celiac artery and a celiac arteriogram was performed. The vessels are significantly attenuated consistent with diffuse vaso spasm. Branches of the short gastric arteries are identified in the region of the previously placed endoscopic clip. Using a glidewire, the 5 [place] catheter was successfully advanced into the splenic artery. Angiography was then performed in multiple obliquities. The splenic artery divides into a superior and inferior division. Both divisions give rise to some a short gastric arteries. A Renegade high flow microcatheter was successfully advanced into the distal splenic artery and then into the superior division. Superior division short  gastric arteries appear to provide blood flow more superiorly along the gastric fundus. The microcatheter was then successfully navigated into the inferior division of the splenic artery. The short gastric arteries are in close proximity to the endovascular clip. No definite active extravasation was identified. The decision was made to perform coil embolization. This will likely devascularize approximately 30-40% of the lower pole of the spleen. Coil embolization was performed using a combination of 2 and 3 mm interlock and vortex coils. Post embolization arteriography demonstrates complete cessation of flow in the arteries in the region of the previously placed endovascular clip. The catheters were removed. Hemostasis was attained with the assistance of an ExoSeal extra arterial vascular plug. IMPRESSION: 1. Successful prior coil embolization of the left gastric artery. 2. Coil embolization of the inferior division of the splenic artery and is associated short gastric arteries in the region of the previously placed endovascular clip. Angiography and embolization therapy has now been maximized. If the patient continues to hemorrhage, repeat endoscopy or surgery would likely be required. Signed, Criselda Peaches, MD [facility] Radiology Electronically Signed   By: Jacqulynn Cadet M.D.   On: 04/13/2017 19:09   Ir Angiogram Selective Each Additional Vessel  Result Date: 04/13/2017 INDICATION: 62 year old female with gastric ulcer and a visible bleeding vessel. Patient underwent embolization yesterday but continues to demonstrate evidence of upper GI bleeding. Repeat angiography is requested. EXAM: IR EMBO ART VEN HEMORR LYMPH EXTRAV INC GUIDE ROADMAPPING; ARTERIOGRAPHY; IR ULTRASOUND GUIDANCE VASC ACCESS RIGHT; SELECTIVE VISCERAL ARTERIOGRAPHY; ADDITIONAL ARTERIOGRAPHY; IR RIGHT FLOURO GUIDE CV LINE 1. Ultrasound-guided access right common femoral artery 2.  Catheterization and angiogram of the left gastric artery 3. Catheterization and angiogram the celiac axis 4. Catheterization and angiogram of  the splenic artery 5. Catheterization and angiogram of the superior division of the splenic artery 6. Catheterization and angiogram of the inferior division of the splenic artery 7. Coil embolization of the inferior division of the splenic artery MEDICATIONS: None ANESTHESIA/SEDATION: Moderate (conscious) sedation was employed during this procedure. A total of Versed 1 mg was administered intravenously. The patient was on a fentanyl drip. Moderate Sedation Time: 60 minutes. The patient's level of consciousness and vital signs were monitored continuously by radiology nursing throughout the procedure under my direct supervision. CONTRAST:  80 mL Isovue 300 FLUOROSCOPY TIME:  Fluoroscopy Time: 13 minutes 6 seconds (1299 mGy). COMPLICATIONS: None immediate. PROCEDURE: Informed consent was obtained from the patient following explanation of the procedure, risks, benefits and alternatives. The patient understands, agrees and consents for the procedure. All questions were addressed. A time out was performed prior to the initiation of the procedure. Maximal barrier sterile technique utilized including caps, mask, sterile gowns, sterile gloves, large sterile drape, hand hygiene, and Betadine prep. The right common femoral artery was interrogated with ultrasound and found to be widely patent. An image was obtained and stored for the medical record. Local anesthesia was attained by infiltration with 1% lidocaine. A small dermatotomy was made. Under real-time sonographic guidance, the vessel was punctured with a 21 gauge micropuncture needle. Using standard technique, the initial micro needle was exchanged over a 0.018 micro wire for a transitional 4 [place] micro sheath. The micro sheath was then exchanged over a 0.035 wire for a 5 French vascular sheath. A C2 cobra catheter was advanced  over a Bentson wire into the abdominal aorta. The catheter was used to select the left gastric artery which arises directly from the aorta. Catheter angiography was performed. Successful coil embolization of the left gastric artery on the prior angiogram. There is no significant perfusion in the region of the vascular clip. The C2 cobra catheter was then advanced into the celiac artery and a celiac arteriogram was performed. The vessels are significantly attenuated consistent with diffuse vaso spasm. Branches of the short gastric arteries are identified in the region of the previously placed endoscopic clip. Using a glidewire, the 5 [place] catheter was successfully advanced into the splenic artery. Angiography was then performed in multiple obliquities. The splenic artery divides into a superior and inferior division. Both divisions give rise to some a short gastric arteries. A Renegade high flow microcatheter was successfully advanced into the distal splenic artery and then into the superior division. Superior division short gastric arteries appear to provide blood flow more superiorly along the gastric fundus. The microcatheter was then successfully navigated into the inferior division of the splenic artery. The short gastric arteries are in close proximity to the endovascular clip. No definite active extravasation was identified. The decision was made to perform coil embolization. This will likely devascularize approximately 30-40% of the lower pole of the spleen. Coil embolization was performed using a combination of 2 and 3 mm interlock and vortex coils. Post embolization arteriography demonstrates complete cessation of flow in the arteries in the region of the previously placed endovascular clip. The catheters were removed. Hemostasis was attained with the assistance of an ExoSeal extra arterial vascular plug. IMPRESSION: 1. Successful prior coil embolization of the left gastric artery. 2. Coil embolization of  the inferior division of the splenic artery and is associated short gastric arteries in the region of the previously placed endovascular clip. Angiography and embolization therapy has now been maximized. If the patient continues to hemorrhage,  repeat endoscopy or surgery would likely be required. Signed, Criselda Peaches, MD [facility] Radiology Electronically Signed   By: Jacqulynn Cadet M.D.   On: 04/13/2017 19:09   Ir Angiogram Selective Each Additional Vessel  Result Date: 04/13/2017 INDICATION: 62 year old female with gastric ulcer and a visible bleeding vessel. Patient underwent embolization yesterday but continues to demonstrate evidence of upper GI bleeding. Repeat angiography is requested. EXAM: IR EMBO ART VEN HEMORR LYMPH EXTRAV INC GUIDE ROADMAPPING; ARTERIOGRAPHY; IR ULTRASOUND GUIDANCE VASC ACCESS RIGHT; SELECTIVE VISCERAL ARTERIOGRAPHY; ADDITIONAL ARTERIOGRAPHY; IR RIGHT FLOURO GUIDE CV LINE 1. Ultrasound-guided access right common femoral artery 2. Catheterization and angiogram of the left gastric artery 3. Catheterization and angiogram the celiac axis 4. Catheterization and angiogram of the splenic artery 5. Catheterization and angiogram of the superior division of the splenic artery 6. Catheterization and angiogram of the inferior division of the splenic artery 7. Coil embolization of the inferior division of the splenic artery MEDICATIONS: None ANESTHESIA/SEDATION: Moderate (conscious) sedation was employed during this procedure. A total of Versed 1 mg was administered intravenously. The patient was on a fentanyl drip. Moderate Sedation Time: 60 minutes. The patient's level of consciousness and vital signs were monitored continuously by radiology nursing throughout the procedure under my direct supervision. CONTRAST:  80 mL Isovue 300 FLUOROSCOPY TIME:  Fluoroscopy Time: 13 minutes 6 seconds (1299 mGy). COMPLICATIONS: None immediate.  PROCEDURE: Informed consent was obtained from the patient following explanation of the procedure, risks, benefits and alternatives. The patient understands, agrees and consents for the procedure. All questions were addressed. A time out was performed prior to the initiation of the procedure. Maximal barrier sterile technique utilized including caps, mask, sterile gowns, sterile gloves, large sterile drape, hand hygiene, and Betadine prep. The right common femoral artery was interrogated with ultrasound and found to be widely patent. An image was obtained and stored for the medical record. Local anesthesia was attained by infiltration with 1% lidocaine. A small dermatotomy was made. Under real-time sonographic guidance, the vessel was punctured with a 21 gauge micropuncture needle. Using standard technique, the initial micro needle was exchanged over a 0.018 micro wire for a transitional 4 [place] micro sheath. The micro sheath was then exchanged over a 0.035 wire for a 5 French vascular sheath. A C2 cobra catheter was advanced over a Bentson wire into the abdominal aorta. The catheter was used to select the left gastric artery which arises directly from the aorta. Catheter angiography was performed. Successful coil embolization of the left gastric artery on the prior angiogram. There is no significant perfusion in the region of the vascular clip. The C2 cobra catheter was then advanced into the celiac artery and a celiac arteriogram was performed. The vessels are significantly attenuated consistent with diffuse vaso spasm. Branches of the short gastric arteries are identified in the region of the previously placed endoscopic clip. Using a glidewire, the 5 [place] catheter was successfully advanced into the splenic artery. Angiography was then performed in multiple obliquities. The splenic artery divides into a superior and inferior division. Both divisions give rise to some a short gastric arteries. A Renegade high  flow microcatheter was successfully advanced into the distal splenic artery and then into the superior division. Superior division short gastric arteries appear to provide blood flow more superiorly along the gastric fundus. The microcatheter was then successfully navigated into the inferior division of the splenic artery. The short gastric arteries are in close proximity to the endovascular clip.  No definite active extravasation was identified. The decision was made to perform coil embolization. This will likely devascularize approximately 30-40% of the lower pole of the spleen. Coil embolization was performed using a combination of 2 and 3 mm interlock and vortex coils. Post embolization arteriography demonstrates complete cessation of flow in the arteries in the region of the previously placed endovascular clip. The catheters were removed. Hemostasis was attained with the assistance of an ExoSeal extra arterial vascular plug. IMPRESSION: 1. Successful prior coil embolization of the left gastric artery. 2. Coil embolization of the inferior division of the splenic artery and is associated short gastric arteries in the region of the previously placed endovascular clip. Angiography and embolization therapy has now been maximized. If the patient continues to hemorrhage, repeat endoscopy or surgery would likely be required. Signed, Criselda Peaches, MD [facility] Radiology Electronically Signed   By: Jacqulynn Cadet M.D.   On: 04/13/2017 19:09   Ir Angiogram Selective Each Additional Vessel  Result Date: 04/12/2017 INDICATION: History of recent upper GI bleed, post endoscopy approximately 1 week ago resulting in placement of an endoscopy clip at the location of a bleeding gastric ulcer. Patient subsequently found to have a lower extremity DVT and was started on anti coagulation (patient has received 2 doses of Xarelto). Unfortunately, this resulted in a  recurrent large upper GI bleed. Repeat upper endoscopy demonstrates a large amount of blood products within the stomach with inability to achieve hemostasis from endoscopic approach. As such, request made for emergent mesenteric arteriogram and potential percutaneous coil embolization as well as placement of an IVC filter for temporary caval interruption purposes. EXAM: 1. ULTRASOUND GUIDANCE FOR VENOUS AND ARTERIAL ACCESS 2. SELECTIVE CELIAC ARTERIOGRAM. 3. SELECTIVE ACCESSORY LEFT GASTRIC ARTERIOGRAM. 4. SELECTIVE ARTERIOGRAM OF DISTAL TRIBUTARY OF THE LEFT GASTRIC ARTERY (3rd ORDER) AND PERCUTANEOUS COIL EMBOLIZATION 5. IVC CATHETERIZATION AND VENOGRAM 6. IVC FILTER INSERTION COMPARISON:  CTA of the abdomen pelvis - 04/03/2017; chest CT - 04/07/2017 MEDICATIONS: None. ANESTHESIA/SEDATION: Versed 58 mg IV Sedation Time: minutes; The patient was continuously monitored during the procedure by the interventional radiology nurse under my direct supervision. CONTRAST:  75 cc Isovue 300 FLUOROSCOPY TIME:  9 Minutes 24 seconds (384 mGy) COMPLICATIONS: None immediate. PROCEDURE: Informed written consent was obtained from the patient's brother following explanation of the procedure, risks, benefits and alternatives. A time out was performed prior to the initiation of the procedure. The right groin was prepped and draped in usual sterile fashion. Maximal barrier sterile technique utilized including caps, mask, sterile gowns, sterile gloves, large sterile drape, hand hygiene, and chlorhexidine prep. The right femoral head was marked fluoroscopically. Under ultrasound guidance, the right common femoral artery was accessed with a micropuncture kit after the overlying soft tissues were anesthetized with 1% lidocaine. An ultrasound image was saved for documentation purposes. The micropuncture sheath was exchanged for a 6 [place] vascular sheath over a Bentson wire. (Note, a 6 [place] vascular sheath was selected for access given  suspected hemodynamically significant stenosis within the mid aspect of the abdominal aorta and potential necessity for angioplasty.) A closure arteriogram was performed through the side of the sheath confirming access within the right common femoral artery, however the 6 [place] vascular sheath was noted to be near occlusive secondary to a combination of atherosclerotic plaque as well as the vasospasm. With the use of a Kumpe catheter, a Bentson wire was advanced to the level of the caudal aspect of the thoracic aorta. Limited  abdominal aortogram was performed demonstrating patency of the abdominal aorta with the 5 French catheter in place and as such abdominal aortic angioplasty would not be necessary and the decision was made to downsized the vascular sheath a 5 [place]. Again, closure arteriogram was performed demonstrating preferential flow through the right internal iliac artery with atretic flow surrounding the vascular sheath. Given patient's critical state the decision was made to proceed with the mesenteric arteriogram and embolization in an expeditious manner in lieu of abandoning the femoral access for a brachial approach. Over a Bentson wire, a Mickelson catheter was advanced to the level of the thoracic aorta where it was back bled and flushed. The catheter was then utilized to select the celiac artery and a selective celiac arteriogram was performed The Mickelson catheter was then utilized to select the left gastric artery and a selective left gastric arteriogram was performed. With the use of an 0.014 fathom microwire, a regular Renegade microcatheter was advanced into a distal tributary of the left gastric artery serving as the predominant arterial supply to the tangle of vessels regional to the endoscopy clip. Selective injection confirmed appropriate positioning. The distal aspect of the branch vessel was then percutaneously coil embolized with multiple overlapping 2 mm and 3 mm soft and regular  interlock coils. The microcatheter was withdrawn into the central aspect of the left gastric artery and a post embolization arteriogram was performed. The microcatheter was removed and a completion left gastric arteriogram was performed via the Mickelson catheter Again, the Mickelson catheter was utilized to select the celiac artery and selective celiac arteriograms were performed in various obliquities. Images were reviewed and the arteriogram portion of the procedure was terminated. Completion closure arteriogram was performed. All wires, catheters and sheaths were removed from the patient. Hemostasis was achieved at the right groin access site with deployment of a ExoSeal closure device and manual compression. _________________________________________________________ Next, attention was made towards placement of the IVC filter. Under sterile condition and local anesthesia, right common femoral venous access was performed with ultrasound. An ultrasound image was saved and sent to PACS. Over a guidewire, the IVC filter delivery sheath and inner dilator were advanced into the IVC just above the IVC bifurcation. Contrast injection was performed for an IVC venogram. Through the delivery sheath, a retrievable Denali IVC filter was deployed below the level of the renal veins and above the IVC bifurcation. Limited post deployment venacavagram was performed. The delivery sheath was removed and hemostasis was obtained with manual compression. A dressing was placed. The patient tolerated the procedure well without immediate post procedural complication. FINDINGS: Selective celiac arteriogram demonstrates non conventional branching pattern without supply to the left gastric artery or significant supply to the gastric fundus, as was demonstrated on preceding CTA. As such, the accessory left gastric artery arising directly from the abdominal aorta was selected with sub selective arteriogram demonstrating a tangle of blood  vessels contributing arterial supply to the portion of the stomach containing the endoscopy clip. Caudal division of the accessory left gastric artery supplies the dominant arterial contribution to the portion of the stomach containing the endoscopy clip and as such, was percutaneously coil embolized with multiple overlapping 2 and 3 mm interlock coils to near the vessel's origin. Completion arteriogram demonstrates a technically excellent result with no definitive persistent arterial supply regional to the endoscopy clip. Following the percutaneous coil embolization, several additional selective arteriograms were performed in various obliquities, again confirming no definitive supply to the gastric fundus. As such, additional  embolization was not performed. The IVC is patent. No evidence of thrombus, stenosis, or occlusion. No variant venous anatomy. Successful placement of the IVC filter below the level of the renal veins. IMPRESSION: 1. Technically successful percutaneous coil embolization of the caudal division of the accessory left gastric artery serving as the predominant arterial supply to the portion of the stomach containing the endoscopy clip. 2. Selective celiac arteriogram fails to delineate significant arterial supply to the gastric fundus and in particular, to the portion of stomach regional to the endoscopy clip. 3. Successful ultrasound and fluoroscopically guided placement of an infrarenal retrievable IVC filter. PLAN: - Patient is to lie flat for 4 hours. Continued aggressive resuscitation as per the critical care team. Note, as the patient has experienced an acute large bleeding episode, I would expect the patient's H and H has not yet reached a nadir but hopefully will stabilize in the next 24-48 hours. - The IVC filter is potentially retrievable. The patient will be assessed for filter retrieval by Interventional Radiology in approximately 8-12 weeks. Further recommendations regarding filter  retrieval, continued surveillance or declaration of device permanence, will be made at that time. Electronically Signed   By: Sandi Mariscal M.D.   On: 04/12/2017 07:04   Ir Ivc Filter Plmt / S&i /img Guid/mod Sed  Result Date: 04/12/2017 INDICATION: History of recent upper GI bleed, post endoscopy approximately 1 week ago resulting in placement of an endoscopy clip at the location of a bleeding gastric ulcer. Patient subsequently found to have a lower extremity DVT and was started on anti coagulation (patient has received 2 doses of Xarelto). Unfortunately, this resulted in a recurrent large upper GI bleed. Repeat upper endoscopy demonstrates a large amount of blood products within the stomach with inability to achieve hemostasis from endoscopic approach. As such, request made for emergent mesenteric arteriogram and potential percutaneous coil embolization as well as placement of an IVC filter for temporary caval interruption purposes. EXAM: 1. ULTRASOUND GUIDANCE FOR VENOUS AND ARTERIAL ACCESS 2. SELECTIVE CELIAC ARTERIOGRAM. 3. SELECTIVE ACCESSORY LEFT GASTRIC ARTERIOGRAM. 4. SELECTIVE ARTERIOGRAM OF DISTAL TRIBUTARY OF THE LEFT GASTRIC ARTERY (3rd ORDER) AND PERCUTANEOUS COIL EMBOLIZATION 5. IVC CATHETERIZATION AND VENOGRAM 6. IVC FILTER INSERTION COMPARISON:  CTA of the abdomen pelvis - 04/03/2017; chest CT - 04/07/2017 MEDICATIONS: None. ANESTHESIA/SEDATION: Versed 58 mg IV Sedation Time: minutes; The patient was continuously monitored during the procedure by the interventional radiology nurse under my direct supervision. CONTRAST:  75 cc Isovue 300 FLUOROSCOPY TIME:  9 Minutes 24 seconds (093 mGy) COMPLICATIONS: None immediate. PROCEDURE: Informed written consent was obtained from the patient's brother following explanation of the procedure, risks, benefits and alternatives. A time out was performed prior to the initiation of the procedure. The right groin was prepped and draped in usual sterile fashion.  Maximal barrier sterile technique utilized including caps, mask, sterile gowns, sterile gloves, large sterile drape, hand hygiene, and chlorhexidine prep. The right femoral head was marked fluoroscopically. Under ultrasound guidance, the right common femoral artery was accessed with a micropuncture kit after the overlying soft tissues were anesthetized with 1% lidocaine. An ultrasound image was saved for documentation purposes. The micropuncture sheath was exchanged for a 6 [place] vascular sheath over a Bentson wire. (Note, a 6 [place] vascular sheath was selected for access given suspected hemodynamically significant stenosis within the mid aspect of the abdominal aorta and potential necessity for angioplasty.) A closure arteriogram was performed through the side of the sheath confirming access within the right common  femoral artery, however the 6 French vascular sheath was noted to be near occlusive secondary to a combination of atherosclerotic plaque as well as the vasospasm. With the use of a Kumpe catheter, a Bentson wire was advanced to the level of the caudal aspect of the thoracic aorta. Limited abdominal aortogram was performed demonstrating patency of the abdominal aorta with the 5 French catheter in place and as such abdominal aortic angioplasty would not be necessary and the decision was made to downsized the vascular sheath a 5 [place]. Again, closure arteriogram was performed demonstrating preferential flow through the right internal iliac artery with atretic flow surrounding the vascular sheath. Given patient's critical state the decision was made to proceed with the mesenteric arteriogram and embolization in an expeditious manner in lieu of abandoning the femoral access for a brachial approach. Over a Bentson wire, a Mickelson catheter was advanced to the level of the thoracic aorta where it was back bled and flushed. The catheter was then utilized to select the celiac artery and a selective celiac  arteriogram was performed The Mickelson catheter was then utilized to select the left gastric artery and a selective left gastric arteriogram was performed. With the use of an 0.014 fathom microwire, a regular Renegade microcatheter was advanced into a distal tributary of the left gastric artery serving as the predominant arterial supply to the tangle of vessels regional to the endoscopy clip. Selective injection confirmed appropriate positioning. The distal aspect of the branch vessel was then percutaneously coil embolized with multiple overlapping 2 mm and 3 mm soft and regular interlock coils. The microcatheter was withdrawn into the central aspect of the left gastric artery and a post embolization arteriogram was performed. The microcatheter was removed and a completion left gastric arteriogram was performed via the Mickelson catheter Again, the Mickelson catheter was utilized to select the celiac artery and selective celiac arteriograms were performed in various obliquities. Images were reviewed and the arteriogram portion of the procedure was terminated. Completion closure arteriogram was performed. All wires, catheters and sheaths were removed from the patient. Hemostasis was achieved at the right groin access site with deployment of a ExoSeal closure device and manual compression. _________________________________________________________ Next, attention was made towards placement of the IVC filter. Under sterile condition and local anesthesia, right common femoral venous access was performed with ultrasound. An ultrasound image was saved and sent to PACS. Over a guidewire, the IVC filter delivery sheath and inner dilator were advanced into the IVC just above the IVC bifurcation. Contrast injection was performed for an IVC venogram. Through the delivery sheath, a retrievable Denali IVC filter was deployed below the level of the renal veins and above the IVC bifurcation. Limited post deployment venacavagram  was performed. The delivery sheath was removed and hemostasis was obtained with manual compression. A dressing was placed. The patient tolerated the procedure well without immediate post procedural complication. FINDINGS: Selective celiac arteriogram demonstrates non conventional branching pattern without supply to the left gastric artery or significant supply to the gastric fundus, as was demonstrated on preceding CTA. As such, the accessory left gastric artery arising directly from the abdominal aorta was selected with sub selective arteriogram demonstrating a tangle of blood vessels contributing arterial supply to the portion of the stomach containing the endoscopy clip. Caudal division of the accessory left gastric artery supplies the dominant arterial contribution to the portion of the stomach containing the endoscopy clip and as such, was percutaneously coil embolized with multiple overlapping 2 and 3 mm interlock  coils to near the vessel's origin. Completion arteriogram demonstrates a technically excellent result with no definitive persistent arterial supply regional to the endoscopy clip. Following the percutaneous coil embolization, several additional selective arteriograms were performed in various obliquities, again confirming no definitive supply to the gastric fundus. As such, additional embolization was not performed. The IVC is patent. No evidence of thrombus, stenosis, or occlusion. No variant venous anatomy. Successful placement of the IVC filter below the level of the renal veins. IMPRESSION: 1. Technically successful percutaneous coil embolization of the caudal division of the accessory left gastric artery serving as the predominant arterial supply to the portion of the stomach containing the endoscopy clip. 2. Selective celiac arteriogram fails to delineate significant arterial supply to the gastric fundus and in particular, to the portion of stomach regional to the endoscopy clip. 3. Successful  ultrasound and fluoroscopically guided placement of an infrarenal retrievable IVC filter. PLAN: - Patient is to lie flat for 4 hours. Continued aggressive resuscitation as per the critical care team. Note, as the patient has experienced an acute large bleeding episode, I would expect the patient's H and H has not yet reached a nadir but hopefully will stabilize in the next 24-48 hours. - The IVC filter is potentially retrievable. The patient will be assessed for filter retrieval by Interventional Radiology in approximately 8-12 weeks. Further recommendations regarding filter retrieval, continued surveillance or declaration of device permanence, will be made at that time. Electronically Signed   By: Sandi Mariscal M.D.   On: 04/12/2017 07:04   Ir Fluoro Guide Cv Line Right  Result Date: 04/13/2017 INDICATION: 62 year old female with gastric ulcer and a visible bleeding vessel. Patient underwent embolization yesterday but continues to demonstrate evidence of upper GI bleeding. Repeat angiography is requested. EXAM: IR EMBO ART VEN HEMORR LYMPH EXTRAV INC GUIDE ROADMAPPING; ARTERIOGRAPHY; IR ULTRASOUND GUIDANCE VASC ACCESS RIGHT; SELECTIVE VISCERAL ARTERIOGRAPHY; ADDITIONAL ARTERIOGRAPHY; IR RIGHT FLOURO GUIDE CV LINE 1. Ultrasound-guided access right common femoral artery 2. Catheterization and angiogram of the left gastric artery 3. Catheterization and angiogram the celiac axis 4. Catheterization and angiogram of the splenic artery 5. Catheterization and angiogram of the superior division of the splenic artery 6. Catheterization and angiogram of the inferior division of the splenic artery 7. Coil embolization of the inferior division of the splenic artery MEDICATIONS: None ANESTHESIA/SEDATION: Moderate (conscious) sedation was employed during this procedure. A total of Versed 1 mg was administered intravenously. The patient was on a fentanyl drip. Moderate Sedation Time: 60 minutes. The patient's level of  consciousness and vital signs were monitored continuously by radiology nursing throughout the procedure under my direct supervision. CONTRAST:  80 mL Isovue 300 FLUOROSCOPY TIME:  Fluoroscopy Time: 13 minutes 6 seconds (1299 mGy). COMPLICATIONS: None immediate. PROCEDURE: Informed consent was obtained from the patient following explanation of the procedure, risks, benefits and alternatives. The patient understands, agrees and consents for the procedure. All questions were addressed. A time out was performed prior to the initiation of the procedure. Maximal barrier sterile technique utilized including caps, mask, sterile gowns, sterile gloves, large sterile drape, hand hygiene, and Betadine prep. The right common femoral artery was interrogated with ultrasound and found to be widely patent. An image was obtained and stored for the medical record. Local anesthesia was attained by infiltration with 1% lidocaine. A small dermatotomy was made. Under real-time sonographic guidance, the vessel was punctured with a 21 gauge micropuncture needle. Using standard technique, the initial micro needle was exchanged over a 0.018 micro  wire for a transitional 4 [place] micro sheath. The micro sheath was then exchanged over a 0.035 wire for a 5 French vascular sheath. A C2 cobra catheter was advanced over a Bentson wire into the abdominal aorta. The catheter was used to select the left gastric artery which arises directly from the aorta. Catheter angiography was performed. Successful coil embolization of the left gastric artery on the prior angiogram. There is no significant perfusion in the region of the vascular clip. The C2 cobra catheter was then advanced into the celiac artery and a celiac arteriogram was performed. The vessels are significantly attenuated consistent with diffuse vaso spasm. Branches of the short gastric arteries are identified in the region of the previously placed endoscopic clip. Using a glidewire, the 5  [place] catheter was successfully advanced into the splenic artery. Angiography was then performed in multiple obliquities. The splenic artery divides into a superior and inferior division. Both divisions give rise to some a short gastric arteries. A Renegade high flow microcatheter was successfully advanced into the distal splenic artery and then into the superior division. Superior division short gastric arteries appear to provide blood flow more superiorly along the gastric fundus. The microcatheter was then successfully navigated into the inferior division of the splenic artery. The short gastric arteries are in close proximity to the endovascular clip. No definite active extravasation was identified. The decision was made to perform coil embolization. This will likely devascularize approximately 30-40% of the lower pole of the spleen. Coil embolization was performed using a combination of 2 and 3 mm interlock and vortex coils. Post embolization arteriography demonstrates complete cessation of flow in the arteries in the region of the previously placed endovascular clip. The catheters were removed. Hemostasis was attained with the assistance of an ExoSeal extra arterial vascular plug. IMPRESSION: 1. Successful prior coil embolization of the left gastric artery. 2. Coil embolization of the inferior division of the splenic artery and is associated short gastric arteries in the region of the previously placed endovascular clip. Angiography and embolization therapy has now been maximized. If the patient continues to hemorrhage, repeat endoscopy or surgery would likely be required. Signed, Criselda Peaches, MD [facility] Radiology Electronically Signed   By: Jacqulynn Cadet M.D.   On: 04/13/2017 19:09   Ir US Guide Vasc Access Right  Result Date: 04/13/2017 INDICATION: 62 year old female with gastric ulcer and a visible bleeding vessel. Patient underwent  embolization yesterday but continues to demonstrate evidence of upper GI bleeding. Repeat angiography is requested. EXAM: IR EMBO ART VEN HEMORR LYMPH EXTRAV INC GUIDE ROADMAPPING; ARTERIOGRAPHY; IR ULTRASOUND GUIDANCE VASC ACCESS RIGHT; SELECTIVE VISCERAL ARTERIOGRAPHY; ADDITIONAL ARTERIOGRAPHY; IR RIGHT FLOURO GUIDE CV LINE 1. Ultrasound-guided access right common femoral artery 2. Catheterization and angiogram of the left gastric artery 3. Catheterization and angiogram the celiac axis 4. Catheterization and angiogram of the splenic artery 5. Catheterization and angiogram of the superior division of the splenic artery 6. Catheterization and angiogram of the inferior division of the splenic artery 7. Coil embolization of the inferior division of the splenic artery MEDICATIONS: None ANESTHESIA/SEDATION: Moderate (conscious) sedation was employed during this procedure. A total of Versed 1 mg was administered intravenously. The patient was on a fentanyl drip. Moderate Sedation Time: 60 minutes. The patient's level of consciousness and vital signs were monitored continuously by radiology nursing throughout the procedure under my direct supervision. CONTRAST:  80 mL Isovue 300 FLUOROSCOPY TIME:  Fluoroscopy Time: 13 minutes 6 seconds (1299 mGy).  COMPLICATIONS: None immediate. PROCEDURE: Informed consent was obtained from the patient following explanation of the procedure, risks, benefits and alternatives. The patient understands, agrees and consents for the procedure. All questions were addressed. A time out was performed prior to the initiation of the procedure. Maximal barrier sterile technique utilized including caps, mask, sterile gowns, sterile gloves, large sterile drape, hand hygiene, and Betadine prep. The right common femoral artery was interrogated with ultrasound and found to be widely patent. An image was obtained and stored for the medical record. Local anesthesia was attained by infiltration with 1%  lidocaine. A small dermatotomy was made. Under real-time sonographic guidance, the vessel was punctured with a 21 gauge micropuncture needle. Using standard technique, the initial micro needle was exchanged over a 0.018 micro wire for a transitional 4 [place] micro sheath. The micro sheath was then exchanged over a 0.035 wire for a 5 French vascular sheath. A C2 cobra catheter was advanced over a Bentson wire into the abdominal aorta. The catheter was used to select the left gastric artery which arises directly from the aorta. Catheter angiography was performed. Successful coil embolization of the left gastric artery on the prior angiogram. There is no significant perfusion in the region of the vascular clip. The C2 cobra catheter was then advanced into the celiac artery and a celiac arteriogram was performed. The vessels are significantly attenuated consistent with diffuse vaso spasm. Branches of the short gastric arteries are identified in the region of the previously placed endoscopic clip. Using a glidewire, the 5 [place] catheter was successfully advanced into the splenic artery. Angiography was then performed in multiple obliquities. The splenic artery divides into a superior and inferior division. Both divisions give rise to some a short gastric arteries. A Renegade high flow microcatheter was successfully advanced into the distal splenic artery and then into the superior division. Superior division short gastric arteries appear to provide blood flow more superiorly along the gastric fundus. The microcatheter was then successfully navigated into the inferior division of the splenic artery. The short gastric arteries are in close proximity to the endovascular clip. No definite active extravasation was identified. The decision was made to perform coil embolization. This will likely devascularize approximately 30-40% of the lower pole of the spleen. Coil embolization was performed using a combination of 2 and 3 mm  interlock and vortex coils. Post embolization arteriography demonstrates complete cessation of flow in the arteries in the region of the previously placed endovascular clip. The catheters were removed. Hemostasis was attained with the assistance of an ExoSeal extra arterial vascular plug. IMPRESSION: 1. Successful prior coil embolization of the left gastric artery. 2. Coil embolization of the inferior division of the splenic artery and is associated short gastric arteries in the region of the previously placed endovascular clip. Angiography and embolization therapy has now been maximized. If the patient continues to hemorrhage, repeat endoscopy or surgery would likely be required. Signed, Criselda Peaches, MD [facility] Radiology Electronically Signed   By: Jacqulynn Cadet M.D.   On: 04/13/2017 19:09   Ir US Guide Vasc Access Right  Result Date: 04/13/2017 INDICATION: 63 year old female with gastric ulcer and a visible bleeding vessel. Patient underwent embolization yesterday but continues to demonstrate evidence of upper GI bleeding. Repeat angiography is requested. EXAM: IR EMBO ART VEN HEMORR LYMPH EXTRAV INC GUIDE ROADMAPPING; ARTERIOGRAPHY; IR ULTRASOUND GUIDANCE VASC ACCESS RIGHT; SELECTIVE VISCERAL ARTERIOGRAPHY; ADDITIONAL ARTERIOGRAPHY; IR RIGHT FLOURO GUIDE CV LINE 1. Ultrasound-guided access right common  femoral artery 2. Catheterization and angiogram of the left gastric artery 3. Catheterization and angiogram the celiac axis 4. Catheterization and angiogram of the splenic artery 5. Catheterization and angiogram of the superior division of the splenic artery 6. Catheterization and angiogram of the inferior division of the splenic artery 7. Coil embolization of the inferior division of the splenic artery MEDICATIONS: None ANESTHESIA/SEDATION: Moderate (conscious) sedation was employed during this procedure. A total of Versed 1 mg was administered  intravenously. The patient was on a fentanyl drip. Moderate Sedation Time: 60 minutes. The patient's level of consciousness and vital signs were monitored continuously by radiology nursing throughout the procedure under my direct supervision. CONTRAST:  80 mL Isovue 300 FLUOROSCOPY TIME:  Fluoroscopy Time: 13 minutes 6 seconds (1299 mGy). COMPLICATIONS: None immediate. PROCEDURE: Informed consent was obtained from the patient following explanation of the procedure, risks, benefits and alternatives. The patient understands, agrees and consents for the procedure. All questions were addressed. A time out was performed prior to the initiation of the procedure. Maximal barrier sterile technique utilized including caps, mask, sterile gowns, sterile gloves, large sterile drape, hand hygiene, and Betadine prep. The right common femoral artery was interrogated with ultrasound and found to be widely patent. An image was obtained and stored for the medical record. Local anesthesia was attained by infiltration with 1% lidocaine. A small dermatotomy was made. Under real-time sonographic guidance, the vessel was punctured with a 21 gauge micropuncture needle. Using standard technique, the initial micro needle was exchanged over a 0.018 micro wire for a transitional 4 [place] micro sheath. The micro sheath was then exchanged over a 0.035 wire for a 5 French vascular sheath. A C2 cobra catheter was advanced over a Bentson wire into the abdominal aorta. The catheter was used to select the left gastric artery which arises directly from the aorta. Catheter angiography was performed. Successful coil embolization of the left gastric artery on the prior angiogram. There is no significant perfusion in the region of the vascular clip. The C2 cobra catheter was then advanced into the celiac artery and a celiac arteriogram was performed. The vessels are significantly attenuated consistent with diffuse vaso spasm. Branches of the short gastric  arteries are identified in the region of the previously placed endoscopic clip. Using a glidewire, the 5 [place] catheter was successfully advanced into the splenic artery. Angiography was then performed in multiple obliquities. The splenic artery divides into a superior and inferior division. Both divisions give rise to some a short gastric arteries. A Renegade high flow microcatheter was successfully advanced into the distal splenic artery and then into the superior division. Superior division short gastric arteries appear to provide blood flow more superiorly along the gastric fundus. The microcatheter was then successfully navigated into the inferior division of the splenic artery. The short gastric arteries are in close proximity to the endovascular clip. No definite active extravasation was identified. The decision was made to perform coil embolization. This will likely devascularize approximately 30-40% of the lower pole of the spleen. Coil embolization was performed using a combination of 2 and 3 mm interlock and vortex coils. Post embolization arteriography demonstrates complete cessation of flow in the arteries in the region of the previously placed endovascular clip. The catheters were removed. Hemostasis was attained with the assistance of an ExoSeal extra arterial vascular plug. IMPRESSION: 1. Successful prior coil embolization of the left gastric artery. 2. Coil embolization of the inferior division of the splenic artery and is associated short gastric arteries  in the region of the previously placed endovascular clip. Angiography and embolization therapy has now been maximized. If the patient continues to hemorrhage, repeat endoscopy or surgery would likely be required. Signed, Criselda Peaches, MD [facility] Electronically Signed   By: Jacqulynn Cadet M.D.   On: 04/13/2017 19:09   Ir US Guide Vasc Access Right  Result Date:  04/12/2017 INDICATION: History of recent upper GI bleed, post endoscopy approximately 1 week ago resulting in placement of an endoscopy clip at the location of a bleeding gastric ulcer. Patient subsequently found to have a lower extremity DVT and was started on anti coagulation (patient has received 2 doses of Xarelto). Unfortunately, this resulted in a recurrent large upper GI bleed. Repeat upper endoscopy demonstrates a large amount of blood products within the stomach with inability to achieve hemostasis from endoscopic approach. As such, request made for emergent mesenteric arteriogram and potential percutaneous coil embolization as well as placement of an IVC filter for temporary caval interruption purposes. EXAM: 1. ULTRASOUND GUIDANCE FOR VENOUS AND ARTERIAL ACCESS 2. SELECTIVE CELIAC ARTERIOGRAM. 3. SELECTIVE ACCESSORY LEFT GASTRIC ARTERIOGRAM. 4. SELECTIVE ARTERIOGRAM OF DISTAL TRIBUTARY OF THE LEFT GASTRIC ARTERY (3rd ORDER) AND PERCUTANEOUS COIL EMBOLIZATION 5. IVC CATHETERIZATION AND VENOGRAM 6. IVC FILTER INSERTION COMPARISON:  CTA of the abdomen pelvis - 04/03/2017; chest CT - 04/07/2017 MEDICATIONS: None. ANESTHESIA/SEDATION: Versed 58 mg IV Sedation Time: minutes; The patient was continuously monitored during the procedure by the interventional radiology nurse under my direct supervision. CONTRAST:  75 cc Isovue 300 FLUOROSCOPY TIME:  9 Minutes 24 seconds (638 mGy) COMPLICATIONS: None immediate. PROCEDURE: Informed written consent was obtained from the patient's brother following explanation of the procedure, risks, benefits and alternatives. A time out was performed prior to the initiation of the procedure. The right groin was prepped and draped in usual sterile fashion. Maximal barrier sterile technique utilized including caps, mask, sterile gowns, sterile gloves, large sterile drape, hand hygiene, and chlorhexidine prep. The right femoral head was marked fluoroscopically. Under ultrasound guidance,  the right common femoral artery was accessed with a micropuncture kit after the overlying soft tissues were anesthetized with 1% lidocaine. An ultrasound image was saved for documentation purposes. The micropuncture sheath was exchanged for a 6 [place] vascular sheath over a Bentson wire. (Note, a 6 [place] vascular sheath was selected for access given suspected hemodynamically significant stenosis within the mid aspect of the abdominal aorta and potential necessity for angioplasty.) A closure arteriogram was performed through the side of the sheath confirming access within the right common femoral artery, however the 6 [place] vascular sheath was noted to be near occlusive secondary to a combination of atherosclerotic plaque as well as the vasospasm. With the use of a Kumpe catheter, a Bentson wire was advanced to the level of the caudal aspect of the thoracic aorta. Limited abdominal aortogram was performed demonstrating patency of the abdominal aorta with the 5 French catheter in place and as such abdominal aortic angioplasty would not be necessary and the decision was made to downsized the vascular sheath a 5 [place]. Again, closure arteriogram was performed demonstrating preferential flow through the right internal iliac artery with atretic flow surrounding the vascular sheath. Given patient's critical state the decision was made to proceed with the mesenteric arteriogram and embolization in an expeditious manner in lieu of abandoning the femoral access for a brachial approach. Over a Bentson wire, a Mickelson catheter was advanced to the level of the thoracic aorta  where it was back bled and flushed. The catheter was then utilized to select the celiac artery and a selective celiac arteriogram was performed The Mickelson catheter was then utilized to select the left gastric artery and a selective left gastric arteriogram was performed. With the use of an 0.014 fathom microwire, a regular Renegade microcatheter was  advanced into a distal tributary of the left gastric artery serving as the predominant arterial supply to the tangle of vessels regional to the endoscopy clip. Selective injection confirmed appropriate positioning. The distal aspect of the branch vessel was then percutaneously coil embolized with multiple overlapping 2 mm and 3 mm soft and regular interlock coils. The microcatheter was withdrawn into the central aspect of the left gastric artery and a post embolization arteriogram was performed. The microcatheter was removed and a completion left gastric arteriogram was performed via the Mickelson catheter Again, the Mickelson catheter was utilized to select the celiac artery and selective celiac arteriograms were performed in various obliquities. Images were reviewed and the arteriogram portion of the procedure was terminated. Completion closure arteriogram was performed. All wires, catheters and sheaths were removed from the patient. Hemostasis was achieved at the right groin access site with deployment of a ExoSeal closure device and manual compression. _________________________________________________________ Next, attention was made towards placement of the IVC filter. Under sterile condition and local anesthesia, right common femoral venous access was performed with ultrasound. An ultrasound image was saved and sent to PACS. Over a guidewire, the IVC filter delivery sheath and inner dilator were advanced into the IVC just above the IVC bifurcation. Contrast injection was performed for an IVC venogram. Through the delivery sheath, a retrievable Denali IVC filter was deployed below the level of the renal veins and above the IVC bifurcation. Limited post deployment venacavagram was performed. The delivery sheath was removed and hemostasis was obtained with manual compression. A dressing was placed. The patient tolerated the procedure well without immediate post procedural complication. FINDINGS: Selective celiac  arteriogram demonstrates non conventional branching pattern without supply to the left gastric artery or significant supply to the gastric fundus, as was demonstrated on preceding CTA. As such, the accessory left gastric artery arising directly from the abdominal aorta was selected with sub selective arteriogram demonstrating a tangle of blood vessels contributing arterial supply to the portion of the stomach containing the endoscopy clip. Caudal division of the accessory left gastric artery supplies the dominant arterial contribution to the portion of the stomach containing the endoscopy clip and as such, was percutaneously coil embolized with multiple overlapping 2 and 3 mm interlock coils to near the vessel's origin. Completion arteriogram demonstrates a technically excellent result with no definitive persistent arterial supply regional to the endoscopy clip. Following the percutaneous coil embolization, several additional selective arteriograms were performed in various obliquities, again confirming no definitive supply to the gastric fundus. As such, additional embolization was not performed. The IVC is patent. No evidence of thrombus, stenosis, or occlusion. No variant venous anatomy. Successful placement of the IVC filter below the level of the renal veins. IMPRESSION: 1. Technically successful percutaneous coil embolization of the caudal division of the accessory left gastric artery serving as the predominant arterial supply to the portion of the stomach containing the endoscopy clip. 2. Selective celiac arteriogram fails to delineate significant arterial supply to the gastric fundus and in particular, to the portion of stomach regional to the endoscopy clip. 3. Successful ultrasound and fluoroscopically guided placement of an infrarenal retrievable IVC filter. PLAN: - Patient  is to lie flat for 4 hours. Continued aggressive resuscitation as per the critical care team. Note, as the patient has experienced an  acute large bleeding episode, I would expect the patient's H and H has not yet reached a nadir but hopefully will stabilize in the next 24-48 hours. - The IVC filter is potentially retrievable. The patient will be assessed for filter retrieval by Interventional Radiology in approximately 8-12 weeks. Further recommendations regarding filter retrieval, continued surveillance or declaration of device permanence, will be made at that time. Electronically Signed   By: Sandi Mariscal M.D.   On: 04/12/2017 07:04   Dg Chest Port 1 View  Result Date: 04/15/2017 CLINICAL DATA:  Ventilator dependent respiratory failure. Atrial fibrillation with rapid ventricular response. Follow-up bilateral pleural effusions. Recent transcatheter embolization for gastric bleeding. EXAM: PORTABLE CHEST 1 VIEW COMPARISON:  04/14/2017, 04/12/2017 and earlier, including CT chest 04/07/2017. FINDINGS: Endotracheal tube tip in satisfactory position projecting approximately 5 cm above the carina. Right jugular central venous catheter projects tip at or near the cavoatrial junction, unchanged. Nasogastric tube courses below the diaphragm into the stomach. Embolization coils in the left upper quadrant of the abdomen. Persistent moderate to large bilateral pleural effusions, right greater than left, unchanged. Associated dense consolidation in the lower lobes, unchanged. Mild pulmonary venous hypertension without overt edema. No new pulmonary parenchymal abnormalities. IMPRESSION: 1.  Support apparatus satisfactory. 2. Stable bilateral pleural effusions, right greater than left, and associated dense passive atelectasis and/or pneumonia in the lower lobes. 3. No new abnormalities. Electronically Signed   By: Evangeline Dakin M.D.   On: 04/15/2017 09:09   Dg Chest Port 1 View  Result Date: 04/14/2017 CLINICAL DATA:  Respiratory failure EXAM: PORTABLE CHEST 1 VIEW COMPARISON:  04/13/2017 FINDINGS: Cardiac shadow is within normal limits. Endotracheal  tube and nasogastric catheter are again noted in satisfactory position. Previously seen left jugular line is been removed and a new right jugular line placed in satisfactory position. Bilateral pleural effusions right greater than left are noted with associated atelectatic changes. IMPRESSION: Increasing bilateral pleural effusions and atelectasis. Electronically Signed   By: Inez Catalina M.D.   On: 04/14/2017 06:59   Dg Chest Port 1 View  Result Date: 04/13/2017 CLINICAL DATA:  Respiratory failure EXAM: PORTABLE CHEST 1 VIEW COMPARISON:  Portable chest x-ray of April 12, 2017 FINDINGS: The lungs are reasonably well inflated. There is persistent increased density at both lung bases greatest on the left. The heart is normal in size. The pulmonary vascularity is less engorged today. The endotracheal tube tip projects 4.5 cm above the carina. The left internal jugular venous catheter tip projects at the junction of the right and left brachiocephalic veins. IMPRESSION: Decreased pulmonary edema. Persistent bibasilar subsegmental atelectasis or pneumonia greatest on the left. Electronically Signed   By: David  Martinique M.D.   On: 04/13/2017 07:00   Dg Chest Port 1 View  Result Date: 04/12/2017 CLINICAL DATA:  Central line placement EXAM: PORTABLE CHEST 1 VIEW COMPARISON:  04/12/2017 FINDINGS: Endotracheal tube tip measures 4.7 cm above the carina. A left jugular central venous catheter has been placed. Tip is over the mid mediastinum consistent with location in the brachiocephalic vein. No pneumothorax. Mild cardiac enlargement. Developing pulmonary vascular congestion with developing perihilar infiltration suggesting edema. No blunting of costophrenic angles. IMPRESSION: Left central venous catheter tip projects over the brachiocephalic vein. No pneumothorax. Developing cardiac enlargement with perihilar edema. Electronically Signed   By: Lucienne Capers M.D.   On: 04/12/2017  04:05   Dg Chest Port 1  View  Result Date: 04/12/2017 CLINICAL DATA:  Intubation EXAM: PORTABLE CHEST 1 VIEW COMPARISON:  CT chest 04/07/2017.  Chest 04/03/2017 FINDINGS: Shallow inspiration. Heart size and pulmonary vascularity are normal. 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 An endotracheal tube is been placed with tip measuring 4.8 cm above the carina. Lungs are clear. No blunting of costophrenic angles. No pneumothorax. IMPRESSION: Endotracheal tube placed with tip measuring 4.8 cm above the carina. Lungs are clear. Electronically Signed   By: Lucienne Capers M.D.   On: 04/12/2017 01:22   Dg Abd 2 Views  Result Date: 04/19/2017 CLINICAL DATA:  Lower abdominal pain for a couple of days. Small bowel obstruction. EXAM: ABDOMEN - 2 VIEW COMPARISON:  04/18/2017 FINDINGS: Prominence of loops of small bowel, without overt dilation. Few air-fluid levels noted on the erect view. No free air. Wire coils are noted in the left upper quadrant, stable. There is a vena cava filter projecting over the right aspect of L2. Opacity is noted at the lung bases consistent with combination of pleural effusions and atelectasis or infiltrate. IMPRESSION: 1. Prominent small bowel loops with a few air-fluid levels. This could reflect a low grade small bowel obstruction. Mild adynamic ileus is felt more likely. The appearance is similar to the previous day's exam. 2. No free air. Electronically Signed   By: Lajean Manes M.D.   On: 04/19/2017 18:19   Dg Abd Portable 1v  Result Date: 04/13/2017 CLINICAL DATA:  62 year old female with nasogastric tube placement. Subsequent encounter. EXAM: PORTABLE ABDOMEN - 1 VIEW COMPARISON:  04/13/2017 CT FINDINGS: Nasogastric tube tip pylorus level with side hole body/antrum junction. Residual contrast within dilated left renal collecting system. Abnormal bowel gas pattern.  Please see CT report. IMPRESSION: Nasogastric tube tip gastric pylorus level with side hole gastric  body/antrum junction. Electronically Signed   By: Genia Del M.D.   On: 04/13/2017 15:36   Dg Abd Portable 2v  Result Date: 04/18/2017 CLINICAL DATA:  Abdominal pain EXAM: PORTABLE ABDOMEN - 2 VIEW COMPARISON:  04/13/2017 FINDINGS: Mildly prominent mid abdominal small bowel loops are noted. Gas within mildly prominent stomach. Interval removal of NG tube. No organomegaly or suspicious calcification. IVC filter in place. IMPRESSION: Increasing prominence of mid to lower abdominal small bowel loops, cannot exclude early small bowel obstruction. Electronically Signed   By: Rolm Baptise M.D.   On: 04/18/2017 12:37   [place] Guide Roadmapping  Result Date: 04/13/2017 INDICATION: 62 year old female with gastric ulcer and a visible bleeding vessel. Patient underwent embolization yesterday but continues to demonstrate evidence of upper GI bleeding. Repeat angiography is requested. EXAM: IR EMBO ART VEN HEMORR LYMPH EXTRAV INC GUIDE ROADMAPPING; ARTERIOGRAPHY; IR ULTRASOUND GUIDANCE VASC ACCESS RIGHT; SELECTIVE VISCERAL ARTERIOGRAPHY; ADDITIONAL ARTERIOGRAPHY; IR RIGHT FLOURO GUIDE CV LINE 1. Ultrasound-guided access right common femoral artery 2. Catheterization and angiogram of the left gastric artery 3. Catheterization and angiogram the celiac axis 4. Catheterization and angiogram of the splenic artery 5. Catheterization and angiogram of the superior division of the splenic artery 6. Catheterization and angiogram of the inferior division of the splenic artery 7. Coil embolization of the inferior division of the splenic artery MEDICATIONS: None ANESTHESIA/SEDATION: Moderate (conscious) sedation was employed during this procedure. A total of Versed 1 mg was administered intravenously. The patient was on a fentanyl drip. Moderate Sedation Time: 60 minutes. The patient's level of consciousness  and vital signs were monitored continuously by radiology nursing throughout the procedure  under my direct supervision. CONTRAST:  80 mL Isovue 300 FLUOROSCOPY TIME:  Fluoroscopy Time: 13 minutes 6 seconds (1299 mGy). COMPLICATIONS: None immediate. PROCEDURE: Informed consent was obtained from the patient following explanation of the procedure, risks, benefits and alternatives. The patient understands, agrees and consents for the procedure. All questions were addressed. A time out was performed prior to the initiation of the procedure. Maximal barrier sterile technique utilized including caps, mask, sterile gowns, sterile gloves, large sterile drape, hand hygiene, and Betadine prep. The right common femoral artery was interrogated with ultrasound and found to be widely patent. An image was obtained and stored for the medical record. Local anesthesia was attained by infiltration with 1% lidocaine. A small dermatotomy was made. Under real-time sonographic guidance, the vessel was punctured with a 21 gauge micropuncture needle. Using standard technique, the initial micro needle was exchanged over a 0.018 micro wire for a transitional 4 [place] micro sheath. The micro sheath was then exchanged over a 0.035 wire for a 5 French vascular sheath. A C2 cobra catheter was advanced over a Bentson wire into the abdominal aorta. The catheter was used to select the left gastric artery which arises directly from the aorta. Catheter angiography was performed. Successful coil embolization of the left gastric artery on the prior angiogram. There is no significant perfusion in the region of the vascular clip. The C2 cobra catheter was then advanced into the celiac artery and a celiac arteriogram was performed. The vessels are significantly attenuated consistent with diffuse vaso spasm. Branches of the short gastric arteries are identified in the region of the previously placed endoscopic clip. Using a glidewire, the 5 [place] catheter was successfully advanced into the splenic artery. Angiography was then performed in  multiple obliquities. The splenic artery divides into a superior and inferior division. Both divisions give rise to some a short gastric arteries. A Renegade high flow microcatheter was successfully advanced into the distal splenic artery and then into the superior division. Superior division short gastric arteries appear to provide blood flow more superiorly along the gastric fundus. The microcatheter was then successfully navigated into the inferior division of the splenic artery. The short gastric arteries are in close proximity to the endovascular clip. No definite active extravasation was identified. The decision was made to perform coil embolization. This will likely devascularize approximately 30-40% of the lower pole of the spleen. Coil embolization was performed using a combination of 2 and 3 mm interlock and vortex coils. Post embolization arteriography demonstrates complete cessation of flow in the arteries in the region of the previously placed endovascular clip. The catheters were removed. Hemostasis was attained with the assistance of an ExoSeal extra arterial vascular plug. IMPRESSION: 1. Successful prior coil embolization of the left gastric artery. 2. Coil embolization of the inferior division of the splenic artery and is associated short gastric arteries in the region of the previously placed endovascular clip. Angiography and embolization therapy has now been maximized. If the patient continues to hemorrhage, repeat endoscopy or surgery would likely be required. Signed, Criselda Peaches, MD [facility] Electronically Signed   By: Jacqulynn Cadet M.D.   On: 04/13/2017 19:09   [place] Guide Roadmapping  Result Date: 04/12/2017 INDICATION: History of recent upper GI bleed, post endoscopy approximately 1 week ago resulting in placement of an endoscopy clip at the location  of a bleeding gastric ulcer.  Patient subsequently found to have a lower extremity DVT and was started on anti coagulation (patient has received 2 doses of Xarelto). Unfortunately, this resulted in a recurrent large upper GI bleed. Repeat upper endoscopy demonstrates a large amount of blood products within the stomach with inability to achieve hemostasis from endoscopic approach. As such, request made for emergent mesenteric arteriogram and potential percutaneous coil embolization as well as placement of an IVC filter for temporary caval interruption purposes. EXAM: 1. ULTRASOUND GUIDANCE FOR VENOUS AND ARTERIAL ACCESS 2. SELECTIVE CELIAC ARTERIOGRAM. 3. SELECTIVE ACCESSORY LEFT GASTRIC ARTERIOGRAM. 4. SELECTIVE ARTERIOGRAM OF DISTAL TRIBUTARY OF THE LEFT GASTRIC ARTERY (3rd ORDER) AND PERCUTANEOUS COIL EMBOLIZATION 5. IVC CATHETERIZATION AND VENOGRAM 6. IVC FILTER INSERTION COMPARISON:  CTA of the abdomen pelvis - 04/03/2017; chest CT - 04/07/2017 MEDICATIONS: None. ANESTHESIA/SEDATION: Versed 58 mg IV Sedation Time: minutes; The patient was continuously monitored during the procedure by the interventional radiology nurse under my direct supervision. CONTRAST:  75 cc Isovue 300 FLUOROSCOPY TIME:  9 Minutes 24 seconds (027 mGy) COMPLICATIONS: None immediate. PROCEDURE: Informed written consent was obtained from the patient's brother following explanation of the procedure, risks, benefits and alternatives. A time out was performed prior to the initiation of the procedure. The right groin was prepped and draped in usual sterile fashion. Maximal barrier sterile technique utilized including caps, mask, sterile gowns, sterile gloves, large sterile drape, hand hygiene, and chlorhexidine prep. The right femoral head was marked fluoroscopically. Under ultrasound guidance, the right common femoral artery was accessed with a micropuncture kit after the overlying soft tissues were anesthetized with 1% lidocaine. An ultrasound image was saved for  documentation purposes. The micropuncture sheath was exchanged for a 6 [place] vascular sheath over a Bentson wire. (Note, a 6 [place] vascular sheath was selected for access given suspected hemodynamically significant stenosis within the mid aspect of the abdominal aorta and potential necessity for angioplasty.) A closure arteriogram was performed through the side of the sheath confirming access within the right common femoral artery, however the 6 [place] vascular sheath was noted to be near occlusive secondary to a combination of atherosclerotic plaque as well as the vasospasm. With the use of a Kumpe catheter, a Bentson wire was advanced to the level of the caudal aspect of the thoracic aorta. Limited abdominal aortogram was performed demonstrating patency of the abdominal aorta with the 5 French catheter in place and as such abdominal aortic angioplasty would not be necessary and the decision was made to downsized the vascular sheath a 5 [place]. Again, closure arteriogram was performed demonstrating preferential flow through the right internal iliac artery with atretic flow surrounding the vascular sheath. Given patient's critical state the decision was made to proceed with the mesenteric arteriogram and embolization in an expeditious manner in lieu of abandoning the femoral access for a brachial approach. Over a Bentson wire, a Mickelson catheter was advanced to the level of the thoracic aorta where it was back bled and flushed. The catheter was then utilized to select the celiac artery and a selective celiac arteriogram was performed The Mickelson catheter was then utilized to select the left gastric artery and a selective left gastric arteriogram was performed. With the use of an 0.014 fathom microwire, a regular Renegade microcatheter was advanced into a distal tributary of the left gastric artery serving as the predominant arterial supply to the tangle of vessels regional to the endoscopy clip. Selective  injection confirmed appropriate positioning. The distal aspect  of the branch vessel was then percutaneously coil embolized with multiple overlapping 2 mm and 3 mm soft and regular interlock coils. The microcatheter was withdrawn into the central aspect of the left gastric artery and a post embolization arteriogram was performed. The microcatheter was removed and a completion left gastric arteriogram was performed via the Mickelson catheter Again, the Mickelson catheter was utilized to select the celiac artery and selective celiac arteriograms were performed in various obliquities. Images were reviewed and the arteriogram portion of the procedure was terminated. Completion closure arteriogram was performed. All wires, catheters and sheaths were removed from the patient. Hemostasis was achieved at the right groin access site with deployment of a ExoSeal closure device and manual compression. _________________________________________________________ Next, attention was made towards placement of the IVC filter. Under sterile condition and local anesthesia, right common femoral venous access was performed with ultrasound. An ultrasound image was saved and sent to PACS. Over a guidewire, the IVC filter delivery sheath and inner dilator were advanced into the IVC just above the IVC bifurcation. Contrast injection was performed for an IVC venogram. Through the delivery sheath, a retrievable Denali IVC filter was deployed below the level of the renal veins and above the IVC bifurcation. Limited post deployment venacavagram was performed. The delivery sheath was removed and hemostasis was obtained with manual compression. A dressing was placed. The patient tolerated the procedure well without immediate post procedural complication. FINDINGS: Selective celiac arteriogram demonstrates non conventional branching pattern without supply to the left gastric artery or significant supply to the gastric fundus, as was demonstrated on  preceding CTA. As such, the accessory left gastric artery arising directly from the abdominal aorta was selected with sub selective arteriogram demonstrating a tangle of blood vessels contributing arterial supply to the portion of the stomach containing the endoscopy clip. Caudal division of the accessory left gastric artery supplies the dominant arterial contribution to the portion of the stomach containing the endoscopy clip and as such, was percutaneously coil embolized with multiple overlapping 2 and 3 mm interlock coils to near the vessel's origin. Completion arteriogram demonstrates a technically excellent result with no definitive persistent arterial supply regional to the endoscopy clip. Following the percutaneous coil embolization, several additional selective arteriograms were performed in various obliquities, again confirming no definitive supply to the gastric fundus. As such, additional embolization was not performed. The IVC is patent. No evidence of thrombus, stenosis, or occlusion. No variant venous anatomy. Successful placement of the IVC filter below the level of the renal veins. IMPRESSION: 1. Technically successful percutaneous coil embolization of the caudal division of the accessory left gastric artery serving as the predominant arterial supply to the portion of the stomach containing the endoscopy clip. 2. Selective celiac arteriogram fails to delineate significant arterial supply to the gastric fundus and in particular, to the portion of stomach regional to the endoscopy clip. 3. Successful ultrasound and fluoroscopically guided placement of an infrarenal retrievable IVC filter. PLAN: - Patient is to lie flat for 4 hours. Continued aggressive resuscitation as per the critical care team. Note, as the patient has experienced an acute large bleeding episode, I would expect the patient's H and H has not yet reached a nadir but hopefully will stabilize in the next 24-48 hours. - The IVC filter is  potentially retrievable. The patient will be assessed for filter retrieval by Interventional Radiology in approximately 8-12 weeks. Further recommendations regarding filter retrieval, continued surveillance or declaration of device permanence, will be made at that time. Electronically  Signed   By: Sandi Mariscal M.D.   On: 04/12/2017 07:04     I have independently reviewed the above radiologic studies.  Recent Lab Findings: Lab Results  Component Value Date   WBC 5.0 04/30/2017   HGB 8.6 (L) 04/30/2017   HCT 28.0 (L) 04/30/2017   PLT 195 04/30/2017   GLUCOSE 90 04/29/2017   ALT 37 04/23/2017   AST 20 04/23/2017   NA 142 04/29/2017   K 3.7 04/29/2017   CL 109 04/29/2017   CREATININE 0.69 04/29/2017   BUN 11 04/29/2017   CO2 23 04/29/2017   TSH 2.440 04/08/2017   INR 1.19 04/15/2017   HGBA1C 4.7 (L) 04/27/2017      Assessment / Plan:   Patient with enlarged AP window lymph node, suggestive of malignancy.  The workup has been delayed by multiple GI bleeds, period of time on ventilator, need for embolization of the splenic artery and placement of inferior vena caval cannula.  The patient is slowly improving from her recent month-long hospitalization and return to the office toda for further evaluation of her mediastinal lymph node .  During her hospitalization no PET scan was done,  At this point will arrange for a PET sca both to evaluate the size of a mediastinal adenopathy and if there are any areas of hypermetabolic activity suggestive of lung cancer or other malignancy that could be biopsied . She will return to see me soon after the PET scan will make a determination about appropriateness of biopsy     Grace Isaac MD      [address] [place] [address] Office [phone]   Beeper [phone]  05/11/2017 4:41 PM

## 2017-05-14 ENCOUNTER — Ambulatory Visit (HOSPITAL_COMMUNITY)
Admission: RE | Admit: 2017-05-14 | Discharge: 2017-05-14 | Disposition: A | Discharge status: Home / Self Care | Payer: BLUE CROSS/BLUE SHIELD | Source: Ambulatory Visit | Attending: Cardiothoracic Surgery | Admitting: Cardiothoracic Surgery

## 2017-05-14 DIAGNOSIS — I700 Atherosclerosis of aorta: Secondary | ICD-10-CM | POA: Insufficient documentation

## 2017-05-14 DIAGNOSIS — R222 Localized swelling, mass and lump, trunk: Secondary | ICD-10-CM | POA: Diagnosis present

## 2017-05-14 DIAGNOSIS — R918 Other nonspecific abnormal finding of lung field: Secondary | ICD-10-CM | POA: Diagnosis not present

## 2017-05-14 DIAGNOSIS — R9389 Abnormal findings on diagnostic imaging of other specified body structures: Secondary | ICD-10-CM | POA: Insufficient documentation

## 2017-05-14 DIAGNOSIS — J9859 Other diseases of mediastinum, not elsewhere classified: Secondary | ICD-10-CM

## 2017-05-14 LAB — GLUCOSE, CAPILLARY: Glucose-Capillary: 90 mg/dL (ref 65–99)

## 2017-05-14 MED ORDER — FLUDEOXYGLUCOSE F - 18 (FDG) INJECTION
6.9000 | Freq: Once | INTRAVENOUS | Status: AC | PRN
  Administered 2017-05-14: 6.9 via INTRAVENOUS

## 2017-05-15 ENCOUNTER — Other Ambulatory Visit: Payer: Self-pay | Admitting: *Deleted

## 2017-05-15 ENCOUNTER — Other Ambulatory Visit: Payer: Self-pay

## 2017-05-15 ENCOUNTER — Ambulatory Visit: Payer: BLUE CROSS/BLUE SHIELD | Admitting: Cardiothoracic Surgery

## 2017-05-15 ENCOUNTER — Encounter: Payer: Self-pay | Admitting: Cardiothoracic Surgery

## 2017-05-15 VITALS — BP 136/63 | HR 89 | Resp 20 | Ht 63.0 in | Wt 129.4 lb

## 2017-05-15 DIAGNOSIS — R918 Other nonspecific abnormal finding of lung field: Secondary | ICD-10-CM

## 2017-05-15 DIAGNOSIS — J9859 Other diseases of mediastinum, not elsewhere classified: Secondary | ICD-10-CM | POA: Diagnosis not present

## 2017-05-15 NOTE — Progress Notes (Signed)
[address]       [place] [address]             [phone]                    [name] [facility] Medical Record #[id number] Date of Birth: 1955/08/22  Referring: Bary Leriche, PA-C Primary Care: [name], MD Primary Cardiologist: No primary care provider on file.  Chief Complaint:    Chief Complaint  Patient presents with  . Mediastinal Mass    f/u after PET 05/14/2017    History of Present Illness:    Kayla Wyatt 62 y.o. female is seen in the office  today for follow-up after recent admission to the hospital for GI bleeding.  She returns today to follow-up on the PET scan that was ordered after her last visit earlier in the week.   The patient was hospitalized for a month.  She had endoscopy done 3 times, colonoscopy done,  Embolization of the inferior division of the splenic artery for repeat GI bleed.  She also had a inferior vena caval filter placed.  She had episodes of delirium and myopathy during her hospitalization.  She has slowly improved is now walking with a cane.  Has had no more GI bleed .   In late January a CT scan of the chest was done which suggested enlarged AP window lymph node which is why she is been referred to the thoracic surgery office.  Since she was discharged from the hospital she has not smoked no more cigarettes.  Current Activity/ Functional Status:  Patient is independent with mobility/ambulation, transfers, ADL's, IADL's.   Zubrod Score: At the time of surgery this patient's most appropriate activity status/level should be described as: []     0    Normal activity, no symptoms []     1    Restricted in physical strenuous activity but ambulatory, able to do out light work [x]     2    Ambulatory and capable of self care, unable to do work activities, up and about               >50 % of waking hours                              []     3    Only limited self care, in bed greater than 50% of waking hours []      4    Completely disabled, no self care, confined to bed or chair []     5    Moribund   Past Medical History:  Diagnosis Date  . Acute blood loss anemia 04/02/2017  . Gastrointestinal hemorrhage with melena   . Hypercholesteremia   . Hypertension   . Persistent atrial fibrillation with rapid ventricular response ([facility]) 04/07/2017  . Vitamin D deficiency     Past Surgical History:  Procedure Laterality Date  . COLONOSCOPY Left 04/09/2017   Procedure: COLONOSCOPY;  Surgeon: Carol Ada, MD;  Location: [facility];  Service: Endoscopy;  Laterality: Left;  . ESOPHAGOGASTRODUODENOSCOPY N/A 04/03/2017   Procedure: ESOPHAGOGASTRODUODENOSCOPY (EGD);  Surgeon: Carol Ada, MD;  Location: [facility];  Service: Endoscopy;  Laterality: N/A;  . ESOPHAGOGASTRODUODENOSCOPY (EGD) WITH PROPOFOL N/A 04/12/2017   Procedure: ESOPHAGOGASTRODUODENOSCOPY (EGD) WITH PROPOFOL;  Surgeon: Milus Banister, MD;  Location[facility]COPY;  Service: Endoscopy;  Laterality: N/A;  . IR ANGIOGRAM  SELECTIVE EACH ADDITIONAL VESSEL  04/12/2017  . IR ANGIOGRAM SELECTIVE EACH ADDITIONAL VESSEL  04/13/2017  . IR ANGIOGRAM SELECTIVE EACH ADDITIONAL VESSEL  04/13/2017  . IR ANGIOGRAM SELECTIVE EACH ADDITIONAL VESSEL  04/13/2017  . IR ANGIOGRAM VISCERAL SELECTIVE  04/12/2017  . IR ANGIOGRAM VISCERAL SELECTIVE  04/13/2017  . IR EMBO ART  VEN HEMORR LYMPH EXTRAV  INC GUIDE ROADMAPPING  04/12/2017  . IR EMBO ART  VEN HEMORR LYMPH EXTRAV  INC GUIDE ROADMAPPING  04/13/2017  . IR FLUORO GUIDE CV LINE RIGHT  04/13/2017  . IR IVC FILTER PLMT / S&I /IMG GUID/MOD SED  04/12/2017  . IR US GUIDE VASC ACCESS RIGHT  04/12/2017  . IR US GUIDE VASC ACCESS RIGHT  04/13/2017  . IR US GUIDE VASC ACCESS RIGHT  04/13/2017  . LEG SURGERY  1974   Blood Clot Removal     Family History  Problem Relation Age of Onset  . Heart disease Father     Social History   Socioeconomic History  . Marital status: Unknown    Spouse name: Not on file  . Number of children:  Not on file  . Years of education: Not on file  . Highest education level: Not on file  Social Needs  . Financial resource strain: Not on file  . Food insecurity - worry: Not on file  . Food insecurity - inability: Not on file  . Transportation needs - medical: Not on file  . Transportation needs - non-medical: Not on file  Occupational History  . Occupation: Has to lift heavy boxes at times.    Employer: [employer]  Tobacco Use  . Smoking status: Former Smoker    Packs/day: 0.50    Years: 40.00    Pack years: 20.00    Types: Cigarettes    Last attempt to quit: 03/31/2017    Years since quitting: 0.1  . Smokeless tobacco: Never Used  Substance and Sexual Activity  . Alcohol use: No    Frequency: Never  . Drug use: No  . Sexual activity: Not Currently  Other Topics Concern  . Not on file  Social History Narrative  . Not on file    Social History   Tobacco Use  Smoking Status Former Smoker  . Packs/day: 0.50  . Years: 40.00  . Pack years: 20.00  . Types: Cigarettes  . Last attempt to quit: 03/31/2017  . Years since quitting: 0.1  Smokeless Tobacco Never Used    Social History   Substance and Sexual Activity  Alcohol Use No  . Frequency: Never     No Known Allergies  Current Outpatient Medications  Medication Sig Dispense Refill  . acetaminophen (TYLENOL) 325 MG tablet Take 1-2 tablets (325-650 mg total) by mouth every 4 (four) hours as needed for mild pain.    . budesonide-formoterol (SYMBICORT) 80-4.5 MCG/ACT inhaler Inhale 2 puffs into the lungs 2 (two) times daily. 1 Inhaler 12  . cholecalciferol (VITAMIN D) 1000 units tablet Take 1,000 Units by mouth 2 (two) times daily.    . collagenase (SANTYL) ointment Apply topically daily. To sacral area with damp to dry dressing. Change daily 15 g 0  . levETIRAcetam (KEPPRA) 500 MG tablet Take 1 tablet (500 mg total) by mouth 2 (two) times daily. 60 tablet 0  . magnesium oxide (MAG-OX) 400 (241.3 Mg) MG tablet Take 0.5  tablets (200 mg total) by mouth 2 (two) times daily. 60 tablet 0  . pantoprazole (PROTONIX) 40 MG tablet Take  1 tablet (40 mg total) by mouth 2 (two) times daily. 60 tablet 0  . polycarbophil (FIBERCON) 625 MG tablet Take 2 tablets (1,250 mg total) by mouth 2 (two) times daily. 120 tablet 0  . potassium chloride SA (K-DUR,KLOR-CON) 20 MEQ tablet Take 1 tablet (20 mEq total) by mouth 2 (two) times daily. 60 tablet 0  . saccharomyces boulardii (FLORASTOR) 250 MG capsule Take 1 capsule (250 mg total) by mouth 2 (two) times daily. 100 capsule 0   No current facility-administered medications for this visit.       Review of Systems: Review of systems is again reviewed with the patient on visit today and unchanged from earlier last week    Cardiac Review of Systems: [Y] = yes  or   [  ] = no   Chest Pain [  n  ]  Resting SOB [ y ] Exertional SOB  Blue.Reese  ]  Orthopnea [ y ]   Pedal Edema [  n ]    Palpitations [ n ] Syncope  [ n ]   Presyncope [ n  ]  General Review of Systems: [Y] = yes [  ]=no Constitional: recent weight change Blue.Reese  ];  Wt loss over the last 3 months [  36 ] anorexia [ y ]; fatigue [ y ]; nausea [ y ]; night sweats [n  ]; fever [ n ]; or chills [  n];          Dental: poor dentition[ n ]; Last Dentist visit:   Eye : blurred vision [ n ]; diplopia [   ]; vision changes [ n ];  Amaurosis fugax[  ]; Resp: cough [ y ];  wheezing[  ];  hemoptysis[ n ]; shortness of breath[ y ]; paroxysmal nocturnal dyspnea[  ]; dyspnea on exertion[  ]; or orthopnea[  ];  GI:  gallstones[n  ], vomiting[ny ];  dysphagia[  ]; melena[  ];  hematochezia [ y ]; heartburn[  ];   Hx of  Colonoscopy[ y ]; GU: kidney stones [  ]; hematuria[n  ];   dysuria [  n];  nocturia[  ];  history of     obstruction [n  ]; urinary frequency [  ]             Skin: rash, swelling[  ];, hair loss[  ];  peripheral edema[  ];  or itching[  ]; Musculosketetal: myalgias[n  ];  joint swelling[ n ];  joint erythema[  ];  joint pain[n  ];   back pain[  ];  Heme/Lymph: bruising[  ];  bleeding[  ];  anemia[  ];  Neuro: TIA[n  ];  headaches[ n ];  stroke[n  ];  vertigo[  ];  seizures[ y ];   paresthesias[  ];  difficulty walking[ y ];  Psych:depression[  ]; anxiety[  ];  Endocrine: diabetes[  ];  thyroid dysfunction[  ];  Immunizations: Flu up to date Blue.Reese  ]; Pneumococcal up to date [ y ];  Other:  Physical Exam: BP 136/63 (BP Location: Left Arm, Patient Position: Sitting, Cuff Size: Normal)   Pulse 89   Resp 20   Ht 5\' 3"  (1.6 m)   Wt 129 lb 6.4 oz (58.7 kg)   SpO2 94% Comment: RA  BMI 22.92 kg/m   PHYSICAL EXAMINATION: General appearance: alert, cooperative and appears older than stated age Head: Normocephalic, without obvious abnormality, atraumatic Neck: no adenopathy, no carotid bruit, no JVD, supple, symmetrical,  trachea midline and thyroid not enlarged, symmetric, no tenderness/mass/nodules Lymph nodes: Cervical, supraclavicular, and axillary nodes normal. Resp: clear to auscultation bilaterally Cardio: regular rate and rhythm, S1, S2 normal, no murmur, click, rub or gallop GI: soft, non-tender; bowel sounds normal; no masses,  no organomegaly Extremities: extremities normal, atraumatic, no cyanosis or edema Neurologic: Grossly normal  Diagnostic Studies & Laboratory data:     Recent Radiology Findings:    Ct Head Wo Contrast  Result Date: 04/19/2017 CLINICAL DATA:  Seizure activity. EXAM: CT HEAD WITHOUT CONTRAST TECHNIQUE: Contiguous axial images were obtained from the base of the skull through the vertex without intravenous contrast. COMPARISON:  None. FINDINGS: Brain: No acute infarct, hemorrhage, or mass lesion is present. The ventricles are of normal size. No significant extraaxial fluid collection is present. Brainstem and cerebellum are normal. Vascular: Vascular calcifications are present within the right cavernous internal carotid artery. There is no hyperdense vessel. Skull: Calvarium is intact. No focal  lytic or blastic lesions are present. No significant extracranial soft tissue injury is present. Sinuses/Orbits: The paranasal sinuses and mastoid air cells are clear. Globes and orbits are within normal limits. IMPRESSION: 1. Atherosclerosis. 2. Otherwise negative CT of the head. Electronically Signed   By: San Morelle M.D.   On: 04/19/2017 18:09   Mr Jeri Cos GX Contrast  Result Date: 04/22/2017 CLINICAL DATA:  Altered mental status, minimally responsive. Seizure like symptoms. EXAM: MRI HEAD WITHOUT AND WITH CONTRAST TECHNIQUE: Multiplanar, multiecho pulse sequences of the brain and surrounding structures were obtained without and with intravenous contrast. CONTRAST:  62mL MULTIHANCE GADOBENATE DIMEGLUMINE 529 MG/ML IV SOLN COMPARISON:  CT HEAD April 19, 2017 FINDINGS: Mildly motion degraded examination. INTRACRANIAL CONTENTS: No reduced diffusion to suggest acute ischemia or status epilepticus. No susceptibility artifact to suggest hemorrhage. The ventricles and sulci are normal for patient's age. Small area faint bilateral parietal FLAIR T2 hyperintense signal. No midline shift, mass effect or masses. No abnormal extra-axial fluid collections. No extra-axial masses. No abnormal intraparenchymal or extra-axial enhancement. VASCULAR: Normal major intracranial vascular flow voids present at skull base. SKULL AND UPPER CERVICAL SPINE: No abnormal sellar expansion. No suspicious calvarial bone marrow signal. Craniocervical junction maintained. SINUSES/ORBITS: Bilateral mastoid effusions.The included ocular globes and orbital contents are non-suspicious. OTHER: Patient is edentulous. IMPRESSION: 1. Motion degraded examination. 2. 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 3. Otherwise negative MRI of the head with and without contrast for age. Electronically Signed   By: Elon Alas M.D.   On: 04/22/2017 00:34   Nm Pet Image Initial (pi)  Skull Base To Thigh  Result Date: 05/15/2017 CLINICAL DATA:  Initial treatment strategy for mediastinal mass on CT. EXAM: NUCLEAR MEDICINE PET SKULL BASE TO THIGH TECHNIQUE: 6.7 mCi F-18 FDG was injected intravenously. Full-ring PET imaging was performed from the skull base to thigh after the radiotracer. CT data was obtained and used for attenuation correction and anatomic localization. Fasting blood glucose: 90 mg/dl Mediastinal blood pool activity: SUV max 2.1 COMPARISON:  Chest CT 04/07/2017.  Abdominopelvic CT 04/13/2017. FINDINGS: NECK: No hypermetabolic cervical lymph nodes are identified.There is intense hypermetabolic activity anteriorly in the oral cavity associated with the tongue, probably physiologic. This has an SUV max of 17.3, however, more than typically seen. Correlation with physical examination recommended. There is also mildly increased metabolic activity within the left pterygoid musculature, also likely physiologic. No suspicious findings of the pharyngeal mucosal space identified on the CT images. Incidental CT findings: Mild carotid atherosclerosis.  CHEST: The previously demonstrated 4.3 x 3.1 cm AP window mass is intensely hypermetabolic with an SUV max of 11.0. No other hypermetabolic mediastinal or axillary lymph nodes are demonstrated. There is new patchy ground-glass opacity in the lingula measuring up to 2.3 cm on image 29/8. This is also mildly hypermetabolic (SUV max 3.9). Because it is new from the previous chest CT, this is probably inflammatory. No suspicious pulmonary nodules are identified. Incidental CT findings: Moderate centrilobular emphysema. Atherosclerosis of the aorta, great vessels and coronary arteries. ABDOMEN/PELVIS: There is no hypermetabolic activity within the liver, adrenal glands, spleen or pancreas. There is no hypermetabolic nodal activity. As demonstrated on previous CT, there is mild asymmetric dilatation of the left renal collecting system and left ureter  without high-grade obstruction. There is limited bladder evaluation by PET-CT, especially in this case as the bladder is nearly empty and there appears to be mild pelvic floor laxity. No pelvic mass identified. Incidental CT findings: Postsurgical changes in the left upper quadrant of the abdomen. IVC filter and severe aortic atherosclerosis. There is extensive calcification within the lumen of the abdominal aorta inferior to the renal arteries. SKELETON: There is no hypermetabolic activity to suggest osseous metastatic disease. Incidental CT findings: none IMPRESSION: 1. The known mediastinal mass is intensely hypermetabolic, worrisome for malignancy. This could reflect lymphoma or small cell lung cancer. Tissue sampling recommended. 2. No peripheral lung mass identified. New patchy ground-glass opacity in the lingula is mildly hypermetabolic and likely inflammatory. 3. Intense activity associated with the anterior oral cavity, probably physiologic. Clinical correlation to exclude tongue lesion recommended. 4. Mild hydroureter on the left, similar to previous CT and possibly related to pelvic floor laxity. 5. Aortic Atherosclerosis (ICD10-I70.0). Severe involvement the infrarenal abdominal aorta. Electronically Signed   By: Richardean Sale M.D.   On: 05/15/2017 08:41   Dg Abd 2 Views  Result Date: 04/19/2017 CLINICAL DATA:  Lower abdominal pain for a couple of days. Small bowel obstruction. EXAM: ABDOMEN - 2 VIEW COMPARISON:  04/18/2017 FINDINGS: Prominence of loops of small bowel, without overt dilation. Few air-fluid levels noted on the erect view. No free air. Wire coils are noted in the left upper quadrant, stable. There is a vena cava filter projecting over the right aspect of L2. Opacity is noted at the lung bases consistent with combination of pleural effusions and atelectasis or infiltrate. IMPRESSION: 1. Prominent small bowel loops with a few air-fluid levels. This could reflect a low grade small bowel  obstruction. Mild adynamic ileus is felt more likely. The appearance is similar to the previous day's exam. 2. No free air. Electronically Signed   By: Lajean Manes M.D.   On: 04/19/2017 18:19   Dg Abd Portable 2v  Result Date: 04/18/2017 CLINICAL DATA:  Abdominal pain EXAM: PORTABLE ABDOMEN - 2 VIEW COMPARISON:  04/13/2017 FINDINGS: Mildly prominent mid abdominal small bowel loops are noted. Gas within mildly prominent stomach. Interval removal of NG tube. No organomegaly or suspicious calcification. IVC filter in place. IMPRESSION: Increasing prominence of mid to lower abdominal small bowel loops, cannot exclude early small bowel obstruction. Electronically Signed   By: Rolm Baptise M.D.   On: 04/18/2017 12:37     I have independently reviewed the above radiologic studies.  Recent Lab Findings: Lab Results  Component Value Date   WBC 5.0 04/30/2017   HGB 8.6 (L) 04/30/2017   HCT 28.0 (L) 04/30/2017   PLT 195 04/30/2017   GLUCOSE 90 04/29/2017   ALT 37 04/23/2017  AST 20 04/23/2017   NA 142 04/29/2017   K 3.7 04/29/2017   CL 109 04/29/2017   CREATININE 0.69 04/29/2017   BUN 11 04/29/2017   CO2 23 04/29/2017   TSH 2.440 04/08/2017   INR 1.19 04/15/2017   HGBA1C 4.7 (L) 04/27/2017      Assessment / Plan:   Patient with enlarged AP window lymph node, suggestive of malignancy.  The workup was delayed by multiple GI bleeds, period of time on ventilator, need for embolization of the splenic artery and placement of infDuring her hospitalization no PET scan was done,   4.3 x 3.1 cm AP window mass hypermetabolic on PET scan, but without other activity in mediastinal lymph nodes.  Plan to proceed with bronchoscopy ebus transbronchial biopsy of the left hilar mass on Tuesday, March 12.  Risks and options were discussed with the patient and her husband in detail.  She is aware that most likely this will be a outpatient procedure.  Grace Isaac MD      [address] [name] [address] Office [phone]   Beeper [phone]  05/15/2017 11:54 AM

## 2017-05-18 ENCOUNTER — Other Ambulatory Visit: Payer: Self-pay

## 2017-05-18 ENCOUNTER — Encounter (HOSPITAL_COMMUNITY): Payer: Self-pay | Admitting: *Deleted

## 2017-05-18 NOTE — Progress Notes (Signed)
Patient verbalized understanding of pre-op instructions and had no further question

## 2017-05-19 ENCOUNTER — Ambulatory Visit (HOSPITAL_COMMUNITY): Payer: BLUE CROSS/BLUE SHIELD

## 2017-05-19 ENCOUNTER — Ambulatory Visit: Payer: BLUE CROSS/BLUE SHIELD | Admitting: Physician Assistant

## 2017-05-19 ENCOUNTER — Ambulatory Visit (HOSPITAL_COMMUNITY): Payer: BLUE CROSS/BLUE SHIELD | Admitting: Anesthesiology

## 2017-05-19 ENCOUNTER — Encounter (HOSPITAL_COMMUNITY): Payer: Self-pay | Admitting: *Deleted

## 2017-05-19 ENCOUNTER — Ambulatory Visit (HOSPITAL_COMMUNITY)
Admission: RE | Admit: 2017-05-19 | Discharge: 2017-05-19 | Disposition: A | Discharge status: Home / Self Care | Payer: BLUE CROSS/BLUE SHIELD | Source: Ambulatory Visit | Attending: Cardiothoracic Surgery | Admitting: Cardiothoracic Surgery

## 2017-05-19 ENCOUNTER — Encounter (HOSPITAL_COMMUNITY)
Admission: RE | Disposition: A | Discharge status: Home / Self Care | Payer: Self-pay | Source: Ambulatory Visit | Attending: Cardiothoracic Surgery

## 2017-05-19 DIAGNOSIS — I10 Essential (primary) hypertension: Secondary | ICD-10-CM | POA: Diagnosis not present

## 2017-05-19 DIAGNOSIS — E7800 Pure hypercholesterolemia, unspecified: Secondary | ICD-10-CM | POA: Diagnosis not present

## 2017-05-19 DIAGNOSIS — C3490 Malignant neoplasm of unspecified part of unspecified bronchus or lung: Secondary | ICD-10-CM | POA: Diagnosis not present

## 2017-05-19 DIAGNOSIS — D360 Benign neoplasm of lymph nodes: Secondary | ICD-10-CM | POA: Diagnosis not present

## 2017-05-19 DIAGNOSIS — Z87891 Personal history of nicotine dependence: Secondary | ICD-10-CM | POA: Diagnosis not present

## 2017-05-19 DIAGNOSIS — R222 Localized swelling, mass and lump, trunk: Secondary | ICD-10-CM | POA: Diagnosis present

## 2017-05-19 DIAGNOSIS — R918 Other nonspecific abnormal finding of lung field: Secondary | ICD-10-CM

## 2017-05-19 HISTORY — DX: Unspecified convulsions: R56.9

## 2017-05-19 HISTORY — PX: VIDEO BRONCHOSCOPY WITH ENDOBRONCHIAL ULTRASOUND: SHX6177

## 2017-05-19 HISTORY — DX: Anxiety disorder, unspecified: F41.9

## 2017-05-19 HISTORY — DX: Headache: R51

## 2017-05-19 HISTORY — DX: Headache, unspecified: R51.9

## 2017-05-19 LAB — COMPREHENSIVE METABOLIC PANEL
ALT: 15 U/L (ref 14–54)
AST: 28 U/L (ref 15–41)
Albumin: 3.7 g/dL (ref 3.5–5.0)
Alkaline Phosphatase: 78 U/L (ref 38–126)
Anion gap: 11 (ref 5–15)
BUN: 10 mg/dL (ref 6–20)
CO2: 22 mmol/L (ref 22–32)
Calcium: 10 mg/dL (ref 8.9–10.3)
Chloride: 105 mmol/L (ref 101–111)
Creatinine, Ser: 0.74 mg/dL (ref 0.44–1.00)
GFR calc Af Amer: >60 mL/min (ref >60)
GFR calc non Af Amer: >60 mL/min (ref >60)
Glucose, Bld: 102 mg/dL — ABNORMAL HIGH (ref 65–99)
Potassium: 3.6 mmol/L (ref 3.5–5.1)
Sodium: 138 mmol/L (ref 135–145)
Total Bilirubin: 0.7 mg/dL (ref 0.3–1.2)
Total Protein: 7.2 g/dL (ref 6.5–8.1)

## 2017-05-19 LAB — CBC
HCT: 40.1 % (ref 36.0–46.0)
Hemoglobin: 12.6 g/dL (ref 12.0–15.0)
MCH: 29.9 pg (ref 26.0–34.0)
MCHC: 31.4 g/dL (ref 30.0–36.0)
MCV: 95 fL (ref 78.0–100.0)
Platelets: 256 10*3/uL (ref 150–400)
RBC: 4.22 MIL/uL (ref 3.87–5.11)
RDW: 15.3 % (ref 11.5–15.5)
WBC: 7.8 10*3/uL (ref 4.0–10.5)

## 2017-05-19 LAB — PROTIME-INR
INR: 0.97
Prothrombin Time: 12.8 seconds (ref 11.4–15.2)

## 2017-05-19 LAB — APTT: aPTT: 28 seconds (ref 24–36)

## 2017-05-19 SURGERY — BRONCHOSCOPY, WITH EBUS
Anesthesia: General

## 2017-05-19 MED ORDER — LABETALOL HCL 5 MG/ML IV SOLN
10.0000 mg | INTRAVENOUS | Status: DC | PRN
  Administered 2017-05-19: 10 mg via INTRAVENOUS

## 2017-05-19 MED ORDER — MIDAZOLAM HCL 2 MG/2ML IJ SOLN
INTRAMUSCULAR | Status: AC
  Filled 2017-05-19: qty 2

## 2017-05-19 MED ORDER — ALBUTEROL SULFATE HFA 108 (90 BASE) MCG/ACT IN AERS
INHALATION_SPRAY | RESPIRATORY_TRACT | Status: AC
  Filled 2017-05-19: qty 6.7

## 2017-05-19 MED ORDER — SUGAMMADEX SODIUM 200 MG/2ML IV SOLN
INTRAVENOUS | Status: DC | PRN
  Administered 2017-05-19: 120 mg via INTRAVENOUS

## 2017-05-19 MED ORDER — ROCURONIUM BROMIDE 100 MG/10ML IV SOLN
INTRAVENOUS | Status: DC | PRN
  Administered 2017-05-19: 45 mg via INTRAVENOUS

## 2017-05-19 MED ORDER — ONDANSETRON HCL 4 MG/2ML IJ SOLN: 4.0000 mg | Freq: Once | INTRAMUSCULAR | Status: DC | PRN

## 2017-05-19 MED ORDER — LABETALOL HCL 5 MG/ML IV SOLN
INTRAVENOUS | Status: AC
  Filled 2017-05-19: qty 4

## 2017-05-19 MED ORDER — PHENYLEPHRINE 40 MCG/ML (10ML) SYRINGE FOR IV PUSH (FOR BLOOD PRESSURE SUPPORT)
PREFILLED_SYRINGE | INTRAVENOUS | Status: DC | PRN
  Administered 2017-05-19: 40 ug via INTRAVENOUS
  Administered 2017-05-19: 80 ug via INTRAVENOUS
  Administered 2017-05-19: 40 ug via INTRAVENOUS
  Administered 2017-05-19: 80 ug via INTRAVENOUS

## 2017-05-19 MED ORDER — ONDANSETRON HCL 4 MG/2ML IJ SOLN
INTRAMUSCULAR | Status: DC | PRN
  Administered 2017-05-19: 4 mg via INTRAVENOUS

## 2017-05-19 MED ORDER — PROPOFOL 10 MG/ML IV BOLUS
INTRAVENOUS | Status: AC
  Filled 2017-05-19: qty 20

## 2017-05-19 MED ORDER — DEXAMETHASONE SODIUM PHOSPHATE 4 MG/ML IJ SOLN
INTRAMUSCULAR | Status: DC | PRN
  Administered 2017-05-19: 4 mg via INTRAVENOUS

## 2017-05-19 MED ORDER — MIDAZOLAM HCL 5 MG/5ML IJ SOLN
INTRAMUSCULAR | Status: DC | PRN
  Administered 2017-05-19 (×2): 1 mg via INTRAVENOUS

## 2017-05-19 MED ORDER — EPHEDRINE SULFATE-NACL 50-0.9 MG/10ML-% IV SOSY
PREFILLED_SYRINGE | INTRAVENOUS | Status: DC | PRN
  Administered 2017-05-19 (×4): 5 mg via INTRAVENOUS

## 2017-05-19 MED ORDER — ESMOLOL HCL 100 MG/10ML IV SOLN
INTRAVENOUS | Status: DC | PRN
  Administered 2017-05-19 (×3): 10 mg via INTRAVENOUS

## 2017-05-19 MED ORDER — PROPOFOL 10 MG/ML IV BOLUS
INTRAVENOUS | Status: DC | PRN
  Administered 2017-05-19: 130 mg via INTRAVENOUS

## 2017-05-19 MED ORDER — DEXMEDETOMIDINE HCL IN NACL 200 MCG/50ML IV SOLN
INTRAVENOUS | Status: AC
  Filled 2017-05-19: qty 50

## 2017-05-19 MED ORDER — ROCURONIUM BROMIDE 50 MG/5ML IV SOLN
INTRAVENOUS | Status: AC
  Filled 2017-05-19: qty 1

## 2017-05-19 MED ORDER — ONDANSETRON HCL 4 MG/2ML IJ SOLN
INTRAMUSCULAR | Status: AC
  Filled 2017-05-19: qty 2

## 2017-05-19 MED ORDER — OXYCODONE HCL 5 MG/5ML PO SOLN: 5.0000 mg | Freq: Once | ORAL | Status: DC | PRN

## 2017-05-19 MED ORDER — PROTAMINE SULFATE 10 MG/ML IV SOLN
INTRAVENOUS | Status: AC
  Filled 2017-05-19: qty 5

## 2017-05-19 MED ORDER — 0.9 % SODIUM CHLORIDE (POUR BTL) OPTIME
TOPICAL | Status: DC | PRN
  Administered 2017-05-19: 1000 mL

## 2017-05-19 MED ORDER — DEXAMETHASONE SODIUM PHOSPHATE 10 MG/ML IJ SOLN
INTRAMUSCULAR | Status: AC
  Filled 2017-05-19: qty 1

## 2017-05-19 MED ORDER — MIDAZOLAM HCL 2 MG/2ML IJ SOLN
INTRAMUSCULAR | Status: AC
  Filled 2017-05-19: qty 4

## 2017-05-19 MED ORDER — FENTANYL CITRATE (PF) 100 MCG/2ML IJ SOLN
INTRAMUSCULAR | Status: DC | PRN
  Administered 2017-05-19 (×2): 50 ug via INTRAVENOUS

## 2017-05-19 MED ORDER — FENTANYL CITRATE (PF) 100 MCG/2ML IJ SOLN: 25.0000 ug | INTRAMUSCULAR | Status: DC | PRN

## 2017-05-19 MED ORDER — PHENYLEPHRINE 40 MCG/ML (10ML) SYRINGE FOR IV PUSH (FOR BLOOD PRESSURE SUPPORT)
PREFILLED_SYRINGE | INTRAVENOUS | Status: AC
  Filled 2017-05-19: qty 10

## 2017-05-19 MED ORDER — LIDOCAINE HCL (CARDIAC) 20 MG/ML IV SOLN
INTRAVENOUS | Status: DC | PRN
  Administered 2017-05-19: 40 mg via INTRAVENOUS

## 2017-05-19 MED ORDER — OXYCODONE HCL 5 MG PO TABS: 5.0000 mg | ORAL_TABLET | Freq: Once | ORAL | Status: DC | PRN

## 2017-05-19 MED ORDER — LACTATED RINGERS IV SOLN
INTRAVENOUS | Status: DC
  Administered 2017-05-19 (×3): via INTRAVENOUS

## 2017-05-19 MED ORDER — FENTANYL CITRATE (PF) 250 MCG/5ML IJ SOLN
INTRAMUSCULAR | Status: AC
  Filled 2017-05-19: qty 5

## 2017-05-19 SURGICAL SUPPLY — 25 items
BRUSH CYTOL CELLEBRITY 1.5X140 (MISCELLANEOUS) IMPLANT
CANISTER SUCT 3000ML PPV (MISCELLANEOUS) ×2 IMPLANT
CONT SPEC 4OZ CLIKSEAL STRL BL (MISCELLANEOUS) ×2 IMPLANT
COVER BACK TABLE 60X90IN (DRAPES) ×2 IMPLANT
COVER DOME SNAP 22 D (MISCELLANEOUS) ×4 IMPLANT
FORCEPS BIOP RJ4 1.8 (CUTTING FORCEPS) IMPLANT
GAUZE SPONGE 4X4 12PLY STRL (GAUZE/BANDAGES/DRESSINGS) ×2 IMPLANT
GLOVE BIO SURGEON STRL SZ 6.5 (GLOVE) ×2 IMPLANT
KIT CLEAN ENDO COMPLIANCE (KITS) ×6 IMPLANT
KIT ROOM TURNOVER OR (KITS) ×2 IMPLANT
MARKER SKIN DUAL TIP RULER LAB (MISCELLANEOUS) ×2 IMPLANT
NEEDLE EBUS SONO TIP PENTAX (NEEDLE) ×2 IMPLANT
NEEDLE FILTER BLUNT 18X 1/2SAF (NEEDLE)
NEEDLE FILTER BLUNT 18X1 1/2 (NEEDLE) IMPLANT
NS IRRIG 1000ML POUR BTL (IV SOLUTION) ×2 IMPLANT
OIL SILICONE PENTAX (PARTS (SERVICE/REPAIRS)) ×2 IMPLANT
PAD ARMBOARD 7.5X6 YLW CONV (MISCELLANEOUS) ×4 IMPLANT
SYR 20CC LL (SYRINGE) ×2 IMPLANT
SYR 20ML ECCENTRIC (SYRINGE) ×2 IMPLANT
TOWEL GREEN STERILE (TOWEL DISPOSABLE) ×2 IMPLANT
TOWEL GREEN STERILE FF (TOWEL DISPOSABLE) ×2 IMPLANT
TRAP SPECIMEN MUCOUS 40CC (MISCELLANEOUS) ×4 IMPLANT
TUBE CONNECTING 20X1/4 (TUBING) ×2 IMPLANT
VALVE DISPOSABLE (MISCELLANEOUS) ×2 IMPLANT
WATER STERILE IRR 1000ML POUR (IV SOLUTION) ×2 IMPLANT

## 2017-05-19 NOTE — Transfer of Care (Signed)
Immediate Anesthesia Transfer of Care Note  Patient: Kayla Wyatt  Procedure(s) Performed: VIDEO BRONCHOSCOPY WITH ENDOBRONCHIAL ULTRASOUND (N/A )  Patient Location: PACU  Anesthesia Type:General  Level of Consciousness: awake, alert , oriented and patient cooperative  Airway & Oxygen Therapy: Patient Spontanous Breathing and Patient connected to nasal cannula oxygen  Post-op Assessment: Report given to RN and Post -op Vital signs reviewed and stable  Post vital signs: Reviewed 120/68, 127, 19, 93% Last Vitals:  Vitals:   05/19/17 1153  BP: (!) 119/49  Pulse: 65  Resp: 20  Temp: 36.4 C  SpO2: 99%    Last Pain:  Vitals:   05/19/17 1153  TempSrc: Oral      Patients Stated Pain Goal: 3 (87/27/61 8485)  Complications: No apparent anesthesia complications

## 2017-05-19 NOTE — H&P (Signed)
[address]       [place] [address]             [phone]                    Kayla Wyatt [facility] Medical Record #[id number] Date of Birth: June 14, 1955  Referring: No ref. provider found Primary Care: [name], MD Primary Cardiologist: No primary care provider on file.  Chief Complaint:    No chief complaint on file.   History of Present Illness:    Kayla Wyatt 62 y.o. female is seen in the office  today for follow-up after recent admission to the hospital for GI bleeding.  She returns today to follow-up on the PET scan that was ordered after her last visit earlier in the week.   The patient was hospitalized for a month.  She had endoscopy done 3 times, colonoscopy done,  Embolization of the inferior division of the splenic artery for repeat GI bleed.  She also had a inferior vena caval filter placed.  She had episodes of delirium and myopathy during her hospitalization.  She has slowly improved is now walking with a cane.  Has had no more GI bleed .   In late January a CT scan of the chest was done which suggested enlarged AP window lymph node which is why she is been referred to the thoracic surgery office.  Since she was discharged from the hospital she has not smoked no more cigarettes.  Current Activity/ Functional Status:  Patient is independent with mobility/ambulation, transfers, ADL's, IADL's.   Zubrod Score: At the time of surgery this patient's most appropriate activity status/level should be described as: []     0    Normal activity, no symptoms []     1    Restricted in physical strenuous activity but ambulatory, able to do out light work [x]     2    Ambulatory and capable of self care, unable to do work activities, up and about               >50 % of waking hours                              []     3    Only limited self care, in bed greater than 50% of waking hours []     4    Completely disabled, no self care, confined to bed  or chair []     5    Moribund   Past Medical History:  Diagnosis Date  . Acute blood loss anemia 04/02/2017  . Anxiety   . Gastrointestinal hemorrhage with melena   . Headache   . Hypercholesteremia   . Hypertension   . Persistent atrial fibrillation with rapid ventricular response ([place]) 04/07/2017  . Seizures ([place])    04/2017 while in hospital at [facility]  . Vitamin D deficiency     Past Surgical History:  Procedure Laterality Date  . COLONOSCOPY Left 04/09/2017   Procedure: COLONOSCOPY;  Surgeon: Carol Ada, MD;  Location: [facility];  Service: Endoscopy;  Laterality: Left;  . ESOPHAGOGASTRODUODENOSCOPY N/A 04/03/2017   Procedure: ESOPHAGOGASTRODUODENOSCOPY (EGD);  Surgeon: Carol Ada, MD;  Location: [facility];  Service: Endoscopy;  Laterality: N/A;  . ESOPHAGOGASTRODUODENOSCOPY (EGD) WITH PROPOFOL N/A 04/12/2017   Procedure: ESOPHAGOGASTRODUODENOSCOPY (EGD) WITH PROPOFOL;  Surgeon: Milus Banister, MD;  Location: [facility];  Service:  Endoscopy;  Laterality: N/A;  . IR ANGIOGRAM SELECTIVE EACH ADDITIONAL VESSEL  04/12/2017  . IR ANGIOGRAM SELECTIVE EACH ADDITIONAL VESSEL  04/13/2017  . IR ANGIOGRAM SELECTIVE EACH ADDITIONAL VESSEL  04/13/2017  . IR ANGIOGRAM SELECTIVE EACH ADDITIONAL VESSEL  04/13/2017  . IR ANGIOGRAM VISCERAL SELECTIVE  04/12/2017  . IR ANGIOGRAM VISCERAL SELECTIVE  04/13/2017  . IR EMBO ART  VEN HEMORR LYMPH EXTRAV  INC GUIDE ROADMAPPING  04/12/2017  . IR EMBO ART  VEN HEMORR LYMPH EXTRAV  INC GUIDE ROADMAPPING  04/13/2017  . IR FLUORO GUIDE CV LINE RIGHT  04/13/2017  . IR IVC FILTER PLMT / S&I /IMG GUID/MOD SED  04/12/2017  . IR US GUIDE VASC ACCESS RIGHT  04/12/2017  . IR US GUIDE VASC ACCESS RIGHT  04/13/2017  . IR US GUIDE VASC ACCESS RIGHT  04/13/2017  . LEG SURGERY  1974   Blood Clot Removal     Family History  Problem Relation Age of Onset  . Heart disease Father     Social History   Socioeconomic History  . Marital status: Single    Spouse name: Not on file    . Number of children: Not on file  . Years of education: Not on file  . Highest education level: Not on file  Social Needs  . Financial resource strain: Not on file  . Food insecurity - worry: Not on file  . Food insecurity - inability: Not on file  . Transportation needs - medical: Not on file  . Transportation needs - non-medical: Not on file  Occupational History  . Occupation: Has to lift heavy boxes at times.    Employer: [employer]  Tobacco Use  . Smoking status: Former Smoker    Packs/day: 0.50    Years: 40.00    Pack years: 20.00    Types: Cigarettes    Last attempt to quit: 03/31/2017    Years since quitting: 0.1  . Smokeless tobacco: Never Used  Substance and Sexual Activity  . Alcohol use: No    Frequency: Never  . Drug use: No  . Sexual activity: Not Currently  Other Topics Concern  . Not on file  Social History Narrative  . Not on file    Social History   Tobacco Use  Smoking Status Former Smoker  . Packs/day: 0.50  . Years: 40.00  . Pack years: 20.00  . Types: Cigarettes  . Last attempt to quit: 03/31/2017  . Years since quitting: 0.1  Smokeless Tobacco Never Used    Social History   Substance and Sexual Activity  Alcohol Use No  . Frequency: Never     No Known Allergies  Current Facility-Administered Medications  Medication Dose Route Frequency Provider Last Rate Last Dose  . lactated ringers infusion   Intravenous Continuous Audry Pili, MD 50 mL/hr at 05/19/17 1219        Review of Systems: Review of systems is again reviewed with the patient on visit today and unchanged from earlier last week    Cardiac Review of Systems: [Y] = yes  or   [  ] = no   Chest Pain [  n  ]  Resting SOB [ y ] Exertional SOB  Blue.Reese  ]  Orthopnea [ y ]   Pedal Edema [  n ]    Palpitations [ n ] Syncope  [ n ]   Presyncope [ n  ]  General Review of Systems: [Y] = yes [  ]=  no Constitional: recent weight change Blue.Reese  ];  Wt loss over the last 3 months [  61 ]  anorexia [ y ]; fatigue [ y ]; nausea [ y ]; night sweats [n  ]; fever [ n ]; or chills [  n];          Dental: poor dentition[ n ]; Last Dentist visit:   Eye : blurred vision [ n ]; diplopia [   ]; vision changes [ n ];  Amaurosis fugax[  ]; Resp: cough [ y ];  wheezing[  ];  hemoptysis[ n ]; shortness of breath[ y ]; paroxysmal nocturnal dyspnea[  ]; dyspnea on exertion[  ]; or orthopnea[  ];  GI:  gallstones[n  ], vomiting[ny ];  dysphagia[  ]; melena[  ];  hematochezia [ y ]; heartburn[  ];   Hx of  Colonoscopy[ y ]; GU: kidney stones [  ]; hematuria[n  ];   dysuria [  n];  nocturia[  ];  history of     obstruction [n  ]; urinary frequency [  ]             Skin: rash, swelling[  ];, hair loss[  ];  peripheral edema[  ];  or itching[  ]; Musculosketetal: myalgias[n  ];  joint swelling[ n ];  joint erythema[  ];  joint pain[n  ];  back pain[  ];  Heme/Lymph: bruising[  ];  bleeding[  ];  anemia[  ];  Neuro: TIA[n  ];  headaches[ n ];  stroke[n  ];  vertigo[  ];  seizures[ y ];   paresthesias[  ];  difficulty walking[ y ];  Psych:depression[  ]; anxiety[  ];  Endocrine: diabetes[  ];  thyroid dysfunction[  ];  Immunizations: Flu up to date Blue.Reese  ]; Pneumococcal up to date [ y ];  Other:  Physical Exam: BP (!) 119/49   Pulse 65   Temp 97.6 F (36.4 C) (Oral)   Resp 20   SpO2 99%   PHYSICAL EXAMINATION: General appearance: alert, cooperative and appears older than stated age Head: Normocephalic, without obvious abnormality, atraumatic Neck: no adenopathy, no carotid bruit, no JVD, supple, symmetrical, trachea midline and thyroid not enlarged, symmetric, no tenderness/mass/nodules Lymph nodes: Cervical, supraclavicular, and axillary nodes normal. Resp: clear to auscultation bilaterally Cardio: regular rate and rhythm, S1, S2 normal, no murmur, click, rub or gallop GI: soft, non-tender; bowel sounds normal; no masses,  no organomegaly Extremities: extremities normal, atraumatic, no cyanosis  or edema Neurologic: Grossly normal  Diagnostic Studies & Laboratory data:     Recent Radiology Findings:    Ct Head Wo Contrast  Result Date: 04/19/2017 CLINICAL DATA:  Seizure activity. EXAM: CT HEAD WITHOUT CONTRAST TECHNIQUE: Contiguous axial images were obtained from the base of the skull through the vertex without intravenous contrast. COMPARISON:  None. FINDINGS: Brain: No acute infarct, hemorrhage, or mass lesion is present. The ventricles are of normal size. No significant extraaxial fluid collection is present. Brainstem and cerebellum are normal. Vascular: Vascular calcifications are present within the right cavernous internal carotid artery. There is no hyperdense vessel. Skull: Calvarium is intact. No focal lytic or blastic lesions are present. No significant extracranial soft tissue injury is present. Sinuses/Orbits: The paranasal sinuses and mastoid air cells are clear. Globes and orbits are within normal limits. IMPRESSION: 1. Atherosclerosis. 2. Otherwise negative CT of the head. Electronically Signed   By: San Morelle M.D.   On: 04/19/2017 18:09   Mr  Brain W Wo Contrast  Result Date: 04/22/2017 CLINICAL DATA:  Altered mental status, minimally responsive. Seizure like symptoms. EXAM: MRI HEAD WITHOUT AND WITH CONTRAST TECHNIQUE: Multiplanar, multiecho pulse sequences of the brain and surrounding structures were obtained without and with intravenous contrast. CONTRAST:  28mL MULTIHANCE GADOBENATE DIMEGLUMINE 529 MG/ML IV SOLN COMPARISON:  CT HEAD April 19, 2017 FINDINGS: Mildly motion degraded examination. INTRACRANIAL CONTENTS: No reduced diffusion to suggest acute ischemia or status epilepticus. No susceptibility artifact to suggest hemorrhage. The ventricles and sulci are normal for patient's age. Small area faint bilateral parietal FLAIR T2 hyperintense signal. No midline shift, mass effect or masses. No abnormal extra-axial fluid collections. No extra-axial masses. No  abnormal intraparenchymal or extra-axial enhancement. VASCULAR: Normal major intracranial vascular flow voids present at skull base. SKULL AND UPPER CERVICAL SPINE: No abnormal sellar expansion. No suspicious calvarial bone marrow signal. Craniocervical junction maintained. SINUSES/ORBITS: Bilateral mastoid effusions.The included ocular globes and orbital contents are non-suspicious. OTHER: Patient is edentulous. IMPRESSION: 1. Motion degraded examination. 2. 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 3. Otherwise negative MRI of the head with and without contrast for age. Electronically Signed   By: Elon Alas M.D.   On: 04/22/2017 00:34   Nm Pet Image Initial (pi) Skull Base To Thigh  Result Date: 05/15/2017 CLINICAL DATA:  Initial treatment strategy for mediastinal mass on CT. EXAM: NUCLEAR MEDICINE PET SKULL BASE TO THIGH TECHNIQUE: 6.7 mCi F-18 FDG was injected intravenously. Full-ring PET imaging was performed from the skull base to thigh after the radiotracer. CT data was obtained and used for attenuation correction and anatomic localization. Fasting blood glucose: 90 mg/dl Mediastinal blood pool activity: SUV max 2.1 COMPARISON:  Chest CT 04/07/2017.  Abdominopelvic CT 04/13/2017. FINDINGS: NECK: No hypermetabolic cervical lymph nodes are identified.There is intense hypermetabolic activity anteriorly in the oral cavity associated with the tongue, probably physiologic. This has an SUV max of 17.3, however, more than typically seen. Correlation with physical examination recommended. There is also mildly increased metabolic activity within the left pterygoid musculature, also likely physiologic. No suspicious findings of the pharyngeal mucosal space identified on the CT images. Incidental CT findings: Mild carotid atherosclerosis. CHEST: The previously demonstrated 4.3 x 3.1 cm AP window mass is intensely hypermetabolic with an SUV max of 11.0. No  other hypermetabolic mediastinal or axillary lymph nodes are demonstrated. There is new patchy ground-glass opacity in the lingula measuring up to 2.3 cm on image 29/8. This is also mildly hypermetabolic (SUV max 3.9). Because it is new from the previous chest CT, this is probably inflammatory. No suspicious pulmonary nodules are identified. Incidental CT findings: Moderate centrilobular emphysema. Atherosclerosis of the aorta, great vessels and coronary arteries. ABDOMEN/PELVIS: There is no hypermetabolic activity within the liver, adrenal glands, spleen or pancreas. There is no hypermetabolic nodal activity. As demonstrated on previous CT, there is mild asymmetric dilatation of the left renal collecting system and left ureter without high-grade obstruction. There is limited bladder evaluation by PET-CT, especially in this case as the bladder is nearly empty and there appears to be mild pelvic floor laxity. No pelvic mass identified. Incidental CT findings: Postsurgical changes in the left upper quadrant of the abdomen. IVC filter and severe aortic atherosclerosis. There is extensive calcification within the lumen of the abdominal aorta inferior to the renal arteries. SKELETON: There is no hypermetabolic activity to suggest osseous metastatic disease. Incidental CT findings: none IMPRESSION: 1. The known mediastinal mass is intensely hypermetabolic, worrisome for malignancy.  This could reflect lymphoma or small cell lung cancer. Tissue sampling recommended. 2. No peripheral lung mass identified. New patchy ground-glass opacity in the lingula is mildly hypermetabolic and likely inflammatory. 3. Intense activity associated with the anterior oral cavity, probably physiologic. Clinical correlation to exclude tongue lesion recommended. 4. Mild hydroureter on the left, similar to previous CT and possibly related to pelvic floor laxity. 5. Aortic Atherosclerosis (ICD10-I70.0). Severe involvement the infrarenal abdominal  aorta. Electronically Signed   By: Richardean Sale M.D.   On: 05/15/2017 08:41   Dg Abd 2 Views  Result Date: 04/19/2017 CLINICAL DATA:  Lower abdominal pain for a couple of days. Small bowel obstruction. EXAM: ABDOMEN - 2 VIEW COMPARISON:  04/18/2017 FINDINGS: Prominence of loops of small bowel, without overt dilation. Few air-fluid levels noted on the erect view. No free air. Wire coils are noted in the left upper quadrant, stable. There is a vena cava filter projecting over the right aspect of L2. Opacity is noted at the lung bases consistent with combination of pleural effusions and atelectasis or infiltrate. IMPRESSION: 1. Prominent small bowel loops with a few air-fluid levels. This could reflect a low grade small bowel obstruction. Mild adynamic ileus is felt more likely. The appearance is similar to the previous day's exam. 2. No free air. Electronically Signed   By: Lajean Manes M.D.   On: 04/19/2017 18:19   Dg Abd Portable 2v  Result Date: 04/18/2017 CLINICAL DATA:  Abdominal pain EXAM: PORTABLE ABDOMEN - 2 VIEW COMPARISON:  04/13/2017 FINDINGS: Mildly prominent mid abdominal small bowel loops are noted. Gas within mildly prominent stomach. Interval removal of NG tube. No organomegaly or suspicious calcification. IVC filter in place. IMPRESSION: Increasing prominence of mid to lower abdominal small bowel loops, cannot exclude early small bowel obstruction. Electronically Signed   By: Rolm Baptise M.D.   On: 04/18/2017 12:37     I have independently reviewed the above radiologic studies.  Recent Lab Findings: Lab Results  Component Value Date   WBC 7.8 05/19/2017   HGB 12.6 05/19/2017   HCT 40.1 05/19/2017   PLT 256 05/19/2017   GLUCOSE 102 (H) 05/19/2017   ALT 15 05/19/2017   AST 28 05/19/2017   NA 138 05/19/2017   K 3.6 05/19/2017   CL 105 05/19/2017   CREATININE 0.74 05/19/2017   BUN 10 05/19/2017   CO2 22 05/19/2017   TSH 2.440 04/08/2017   INR 0.97 05/19/2017   HGBA1C 4.7  (L) 04/27/2017      Assessment / Plan:   Patient with enlarged AP window lymph node, suggestive of malignancy.  The workup was delayed by multiple GI bleeds, period of time on ventilator, need for embolization of the splenic artery and placement of infDuring her hospitalization no PET scan was done,   4.3 x 3.1 cm AP window mass hypermetabolic on PET scan, but without other activity in mediastinal lymph nodes.  Plan to proceed with bronchoscopy ebus transbronchial biopsy of the left hilar mass .  Risks and options were discussed with the patient and her husband in detail.  The goals risks and alternatives of the planned surgical procedure Procedure(s): VIDEO BRONCHOSCOPY WITH ENDOBRONCHIAL ULTRASOUND (N/A)  have been discussed with the patient in detail. The risks of the procedure including death, infection, stroke, myocardial infarction, bleeding, blood transfusion have all been discussed specifically.  I have quoted Anders Simmonds a 1 % of perioperative mortality and a complication rate as high as 10%. The patient's questions have been  answered.Gearldean Lomanto is willing  to proceed with the planned procedure.  Grace Isaac MD      [address] [place] [address] Office [phone]   Beeper [phone]  05/19/2017 2:04 PM

## 2017-05-19 NOTE — Anesthesia Procedure Notes (Signed)
Procedure Name: Intubation Date/Time: 05/19/2017 3:16 PM Performed by: Sammie Bench, CRNA Pre-anesthesia Checklist: Patient identified, Emergency Drugs available, Suction available and Patient being monitored Patient Re-evaluated:Patient Re-evaluated prior to induction Oxygen Delivery Method: Circle System Utilized Preoxygenation: Pre-oxygenation with 100% oxygen Induction Type: IV induction Ventilation: Mask ventilation without difficulty Laryngoscope Size: Mac and 3 Grade View: Grade I Tube type: Oral Tube size: 8.0 mm Number of attempts: 1 Airway Equipment and Method: Stylet and Oral airway Placement Confirmation: ETT inserted through vocal cords under direct vision,  positive ETCO2 and breath sounds checked- equal and bilateral Tube secured with: Tape Dental Injury: Teeth and Oropharynx as per pre-operative assessment

## 2017-05-19 NOTE — Anesthesia Preprocedure Evaluation (Addendum)
Anesthesia Evaluation  Patient identified by MRN, date of birth, ID band Patient awake    Reviewed: Allergy & Precautions, NPO status , Patient's Chart, lab work & pertinent test results  Airway Mallampati: II  TM Distance: >3 FB Neck ROM: Full    Dental  (+) Edentulous Upper, Edentulous Lower   Pulmonary former smoker,  CXR 04/2017 - IMPRESSION: Stable bilateral pleural effusions, right greater than left, and associated dense passive atelectasis and/or pneumonia in the lower lobes.  '19 CT Chest - Mediastinum/Nodes: Soft tissue mass within the aortopulmonary window region of the mediastinum, measuring 4.1 cm greatest dimension. Scattered small lymph nodes elsewhere within the mediastinum. Esophagus is unremarkable. Trachea and central bronchi are unremarkable.   breath sounds clear to auscultation       Cardiovascular hypertension, Pt. on medications + dysrhythmias Atrial Fibrillation  Rhythm:Regular Rate:Normal  '19 TTE - EF of 65% to 70%. Grade 1 diastolic dysfunction.  EKG - SR with PVCs   Neuro/Psych  Headaches, Seizures -,  Anxiety    GI/Hepatic Neg liver ROS, PUD, Hx GIB   Endo/Other  negative endocrine ROS  Renal/GU negative Renal ROS     Musculoskeletal   Abdominal (+) + obese,   Peds  Hematology  (+) anemia ,   Anesthesia Other Findings   Reproductive/Obstetrics                           Lab Results  Component Value Date   WBC 5.0 04/30/2017   HGB 8.6 (L) 04/30/2017   HCT 28.0 (L) 04/30/2017   MCV 96.6 04/30/2017   PLT 195 04/30/2017   Lab Results  Component Value Date   INR 1.19 04/15/2017   INR 1.27 04/14/2017   INR 1.97 04/13/2017   Lab Results  Component Value Date   CREATININE 0.69 04/29/2017   BUN 11 04/29/2017   NA 142 04/29/2017   K 3.7 04/29/2017   CL 109 04/29/2017   CO2 23 04/29/2017   EKG: atrial fibrillation. Converted to SR.  Echo: - Left  ventricle: The cavity size was normal. Wall thickness was   normal. Systolic function was normal. The estimated ejection   fraction was in the range of 55% to 60%. Wall motion was normal;   there were no regional wall motion abnormalities. Features are   consistent with a pseudonormal left ventricular filling pattern,   with concomitant abnormal relaxation and increased filling   pressure (grade 2 diastolic dysfunction). - Mitral valve: There was mild regurgitation. - Left atrium: The atrium was mildly dilated. - Atrial septum: There was increased thickness of the septum,   consistent with lipomatous hypertrophy.  Anesthesia Physical  Anesthesia Plan  ASA: III  Anesthesia Plan: General   Post-op Pain Management:    Induction: Intravenous  PONV Risk Score and Plan: 3 and Treatment may vary due to age or medical condition, Ondansetron, Dexamethasone and Scopolamine patch - Pre-op  Airway Management Planned: Oral ETT  Additional Equipment: None  Intra-op Plan:   Post-operative Plan: Extubation in OR  Informed Consent: I have reviewed the patients History and Physical, chart, labs and discussed the procedure including the risks, benefits and alternatives for the proposed anesthesia with the patient or authorized representative who has indicated his/her understanding and acceptance.   Dental advisory given  Plan Discussed with: CRNA  Anesthesia Plan Comments:        Anesthesia Quick Evaluation

## 2017-05-19 NOTE — Anesthesia Postprocedure Evaluation (Signed)
Anesthesia Post Note  Patient: Karlene Southard  Procedure(s) Performed: VIDEO BRONCHOSCOPY WITH ENDOBRONCHIAL ULTRASOUND (N/A )     Patient location during evaluation: PACU Anesthesia Type: General Level of consciousness: awake and alert Pain management: pain level controlled Vital Signs Assessment: post-procedure vital signs reviewed and stable Respiratory status: spontaneous breathing, nonlabored ventilation, respiratory function stable and patient connected to nasal cannula oxygen Cardiovascular status: blood pressure returned to baseline, stable and tachycardic Postop Assessment: no apparent nausea or vomiting Anesthetic complications: no    Last Vitals:  Vitals:   05/19/17 1717 05/19/17 1731  BP:    Pulse:    Resp:    Temp:    SpO2: 93% 92%    Last Pain:  Vitals:   05/19/17 1153  TempSrc: Oral                 Audry Pili

## 2017-05-19 NOTE — Discharge Instructions (Signed)
Flexible Bronchoscopy, Care After °This sheet gives you information about how to care for yourself after your procedure. Your health care provider may also give you more specific instructions. If you have problems or questions, contact your health care provider. °What can I expect after the procedure? °After the procedure, it is common to have the following symptoms for 24-48 hours: °· A cough that is worse than it was before the procedure. °· A low-grade fever. °· A sore throat or hoarse voice. °· Small streaks of blood in the mucus from your lungs (sputum), if tissue samples were removed (biopsy). ° °Follow these instructions at home: °Eating and drinking °· Do not eat or drink anything (including water) for 2 hours after your procedure, or until your numbing medicine (local anesthetic) has worn off. Having a numb throat increases your risk of burning yourself or choking. °· After your numbness is gone and your cough and gag reflexes have returned, you may start eating only soft foods and slowly drinking liquids. °· The day after the procedure, return to your normal diet. °Driving °· Do not drive for 24 hours if you were given a medicine to help you relax (sedative). °· Do not drive or use heavy machinery while taking prescription pain medicine. °General instructions °· Take over-the-counter and prescription medicines only as told by your health care provider. °· Return to your normal activities as told by your health care provider. Ask your health care provider what activities are safe for you. °· Do not use any products that contain nicotine or tobacco, such as cigarettes and e-cigarettes. If you need help quitting, ask your health care provider. °· Keep all follow-up visits as told by your health care provider. This is important, especially if you had a biopsy taken. °Get help right away if: °· You have shortness of breath that gets worse. °· You become light-headed or feel like you might faint. °· You have  chest pain. °· You cough up more than a small amount of blood. °· The amount of blood you cough up increases. °Summary °· Common symptoms in the 24-48 hours following a flexible bronchoscopy include cough, low-grade fever, sore throat or hoarse voice, and blood-streaked mucus from the lungs (if you had a biopsy). °· Do not eat or drink anything (including water) for 2 hours after your procedure, or until your local anesthetic has worn off. You can return to your normal diet the day after the procedure. °· Get help right away if you develop worsening shortness of breath, have chest pain, become light-headed, or cough up more than a small amount of blood. °This information is not intended to replace advice given to you by your health care provider. Make sure you discuss any questions you have with your health care provider. °Document Released: 09/13/2004 Document Revised: 03/14/2016 Document Reviewed: 03/14/2016 °Elsevier Interactive Patient Education © 2017 [employer] ° °

## 2017-05-19 NOTE — Brief Op Note (Signed)
      [address]       [place] [address]             [phone]     05/19/2017  4:23 PM  PATIENT:  Kayla Wyatt  61 y.o. female  PRE-OPERATIVE DIAGNOSIS:  MEDIASTINAL MASS  POST-OPERATIVE DIAGNOSIS:  MEDIASTINAL MASS/ non small cell cancer   PROCEDURE:  Procedure(s): VIDEO BRONCHOSCOPY WITH ENDOBRONCHIAL ULTRASOUND (N/A) and transbronchials biopsy  SURGEON:  Surgeon(s) and Role:    * Grace Isaac, MD - Primary   ANESTHESIA:   general  EBL:  None   BLOOD ADMINISTERED:none  DRAINS: none   LOCAL MEDICATIONS USED:  NONE  SPECIMEN:  Source of Specimen:  # 5 node  DISPOSITION OF SPECIMEN:  PATHOLOGY  COUNTS:  YES   DICTATION: .Dragon Dictation  PLAN OF CARE: Discharge to home after PACU  PATIENT DISPOSITION:  PACU - hemodynamically stable.   Delay start of Pharmacological VTE agent (>24hrs) due to surgical blood loss or risk of bleeding: yes

## 2017-05-19 NOTE — Progress Notes (Signed)
Spoke with Dr Fransisco Beau re HR 120's-130's/ orders for labetalol

## 2017-05-20 ENCOUNTER — Encounter (HOSPITAL_COMMUNITY): Payer: Self-pay | Admitting: Cardiothoracic Surgery

## 2017-05-20 NOTE — Op Note (Signed)
NAMEDORISE, GANGI              ACCOUNT NO.:  [id number]  MEDICAL RECORD NO.:  [id number]  LOCATION:                                 FACILITY:  PHYSICIAN:  Lanelle Bal, MD    DATE OF BIRTH:  10/30/1955  DATE OF PROCEDURE:  05/19/2017 DATE OF DISCHARGE:  05/19/2017                              OPERATIVE REPORT   PREOPERATIVE DIAGNOSIS:  Left hilar mass.  POSTOPERATIVE DIAGNOSIS:  Left hilar mass, non-small cell lung cancer, on initial quick stain.  PROCEDURE PERFORMED:  Bronchoscopy with endobronchial ultrasound with transbronchial biopsy of #5 nodes.  SURGEON:  Lanelle Bal, MD.  BRIEF HISTORY:  The patient is a 62 year old female, who was hospitalized for recurrent GI bleeds in January 2019.  She had a prolonged hospital course including time on ventilator and also significant myopathy.  She has gradually improved during the hospitalization.  CT scan of the chest have been obtained, which suggested a left hilar mass.  After the patient was discharged, we arranged for a PET scan, which confirmed that this area was hypermetabolic and highly suspicious for malignancy.  A bronch EBUS with biopsies was recommended to the patient to obtain a tissue diagnosis. She agreed and signed informed consent.  DESCRIPTION OF PROCEDURE:  The patient underwent general endotracheal anesthesia without incident.  Appropriate time-out was performed and through the endotracheal tube, we used a video bronchoscope to examine the tracheobronchial tree to the subsegmental level, both the right and left trachea without evidence of endobronchial lesions.  The scope was then removed and EBUS scope placed with this in the takeoff to the left mainstem bronchus.  The EBUS scope was placed.  This gave good visualization of a large mass consistent with a hypermetabolic area on the CT scan.  Multiple passes with transbronchial aspirating needle were performed.  Initial 3 passes were placed on slides  and submitted to Dr. Melina Copa for examination.  These were interpreted as malignant consistent with non-small cell carcinoma of the lung.  While waiting for confirmation, additional passes of the same area were done and placed directly in satellite for further cytologic and molecular testing with the positive diagnosis in the node area in question labeled #5.  We then removed the EBUS scope again with a standard scope.  The tracheobronchial tree was cleared of all secretions.  The patient was then extubated in the operating room and transferred to the recovery room for further postoperative care.  She tolerated the procedure without obvious complication.     Lanelle Bal, MD    EG/MEDQ  D:  05/20/2017  T:  05/20/2017  Job:  [id number]

## 2017-05-20 NOTE — Op Note (Deleted)
  The note originally documented on this encounter has been moved the the encounter in which it belongs.  

## 2017-05-21 ENCOUNTER — Encounter: Payer: Self-pay | Admitting: *Deleted

## 2017-05-21 ENCOUNTER — Telehealth: Payer: Self-pay | Admitting: *Deleted

## 2017-05-28 ENCOUNTER — Encounter: Payer: Self-pay | Admitting: Hematology

## 2017-05-28 ENCOUNTER — Telehealth: Payer: Self-pay | Admitting: Hematology

## 2017-05-28 NOTE — Telephone Encounter (Signed)
Pt has been scheduled to see Dr. Irene Limbo on 3/27 at 11am. Pt aware to arrive 30 minutes early. Letter mailed.

## 2017-06-01 ENCOUNTER — Inpatient Hospital Stay: Payer: BLUE CROSS/BLUE SHIELD | Attending: Hematology | Admitting: Hematology

## 2017-06-01 ENCOUNTER — Encounter: Payer: Self-pay | Admitting: Hematology

## 2017-06-01 ENCOUNTER — Telehealth: Payer: Self-pay | Admitting: Hematology

## 2017-06-01 VITALS — BP 141/51 | HR 75 | Temp 97.8°F | Resp 18 | Ht 63.0 in | Wt 127.5 lb

## 2017-06-01 DIAGNOSIS — I824Z1 Acute embolism and thrombosis of unspecified deep veins of right distal lower extremity: Secondary | ICD-10-CM

## 2017-06-01 DIAGNOSIS — C3490 Malignant neoplasm of unspecified part of unspecified bronchus or lung: Secondary | ICD-10-CM | POA: Diagnosis not present

## 2017-06-01 DIAGNOSIS — K922 Gastrointestinal hemorrhage, unspecified: Secondary | ICD-10-CM | POA: Diagnosis not present

## 2017-06-01 DIAGNOSIS — R569 Unspecified convulsions: Secondary | ICD-10-CM

## 2017-06-01 NOTE — Telephone Encounter (Signed)
Appointments scheduled I also scheduled her PFT testing/ AVS/[place] printed per 3/25 los

## 2017-06-01 NOTE — Progress Notes (Signed)
HEMATOLOGY/ONCOLOGY CONSULTATION NOTE  Date of Service: 06/01/17  Patient Care Team: [name], MD as PCP - General (Family Medicine)  CHIEF COMPLAINTS/PURPOSE OF CONSULTATION:  Newly diagnosed Non-Small Cell Carcinoma  HISTORY OF PRESENTING ILLNESS:   Kayla Wyatt is a wonderful 62 y.o. female who has been referred to [place] by Dr [name] for evaluation and management of Non-Small Cell Carcinoma. She is accompanied today by her husband. The pt reports that she is doing well overall.   Patient recently had a significant hospitalization on 04/02/2017 - as per DC summary by [name] " with abdominal pain, hematemesis, melena and ABLA due to GIB.  She underwent clipping of gastric ulcer and received multiple units PRBC but continued to have drop in H/H with bloody stool. NM GI scan was negative for bleeding and she underwent colonoscopy by Dr. [name] 1/31 showing diverticulosis but no signs of bleeding. Hospital course significant for R- gastrocnemius DVT, PAF, incidental findings of mediastinal mass as well as brief episode of hematuria. She was cleared to start on Xarelto on 02/01 for treatment of DVT. On 2/2 am she developed hematemesis with maroon stools and hypotension requiring fluid bolus as well as 2 units PRBCs. She continued to decline requiring intubation, pressors as well as reversal of anticoagulation with Kcentra on 02/3. She underwent UGI revealing large clot with fresh blood in stomach and underwent mesenteric arteriogram with percutaneous coil embolization fo distal tributary of left gastric artery and placement of IVC filter by interventional radiology.   A fib with RVR felt to be due to hemorrhagic shock and she converted to NSR. [name] felt no further cardiac work up indicated and did not recommend initiating anticoagulation in short term. To follow up with [name] after PET scan and routine biopsy after medically stable. She continued to have bleeding  and underwent visceral angiogram with embolization of inferior division of splenic artery and associated gastric arteries on 2/4. No surgical intervention needed per [name] with recommendations to continue monitoring H/H as well as for recurrence of hematochezia. She tolerated extubation on 2/6 and respiratory status improving.  On 2/11, she has episode of unresponsiveness with jerking of bilateral limbs that lasted about 3 minutes. She had amnesia of events but was back to baseline. EEG revealed "focal slowing over the right temporo-occipital regionandoccasional epileptiform discharges over the right occipital region". Head CT reviewed, unremarkable for acute intracranial process. MRI brain done revealing mild biparietal signal abnormality suspicious for PRES. [name] recommended starting patient on Keppra BID with repeat MRI in 2 weeks. New onset seizure likely provoked by PRES--if resolved--Ok to take patient off Keppra. Patient with resultant generalized weakness. CIR recommended due to functional deficits. "   Later as outpatient she had a PET scan on 05/14/17 which showed a hypermetabolic mediastinal mass which was subsequently biopsied with a bronchoscopy on 05/19/17. She notes that she had smoked for about 35-40 years, half a pack of a day. She reports that she has not had any other symptoms related to her smoking; she has never had to be on home oxygen or inhalers. She has recently begun Symbicort. While being in the hospital she lost about 20 lbs but has maintained her weight since being discharged.  The pt and her husband note that she is concerned about what to do if she is not able to go back to work after her 90-day leave. We recommended that they speak with our social workers here to explore the available  options for insurance coverage /disability application etc.  Of note prior to the patient's visit today, pt has had PET/CT completed on 05/14/17 with results revealing 1. The  known mediastinal mass is intensely hypermetabolic, worrisome for malignancy. This could reflect lymphoma or small cell lung cancer. Tissue sampling recommended.2. No peripheral lung mass identified. New patchy ground-glass opacity in the lingula is mildly hypermetabolic and likely inflammatory.  On 05/19/17 the pt had a Fine Needle Aspiration which revealed Non-Small cell lung carcinoma.  Most recent lab results (05/19/17) of CBC  is as follows: all values are WNL except for RBC at 2.90, Hgb at 8.6, HCT at 28.0, RDW at 16.4.  On review of systems, pt reports knee pain, and denies abdominal pains, leg swelling, SOB, CP, and any other symptoms.   On PMHx the pt reports acute blood loss anemia, GI hemorrhage with melena, hypercholesteremia, HTN, persistent atrial fibrillation with rapid ventricular response, Vitamin D deficiency, denies liver and kidney problems. On Social Hx the pt reports having smoked for 35-40 years of half a pack per day. She quit smoking on 03/31/17.  On Family Hx the pt reports that her father had heart disease.  MEDICAL HISTORY:  Past Medical History:  Diagnosis Date  . Acute blood loss anemia 04/02/2017  . Anxiety   . Gastrointestinal hemorrhage with melena   . Headache   . Hypercholesteremia   . Hypertension   . Persistent atrial fibrillation with rapid ventricular response ([place]) 04/07/2017  . Seizures ([place])    04/2017 while in hospital at [facility]  . Vitamin D deficiency     SURGICAL HISTORY: Past Surgical History:  Procedure Laterality Date  . COLONOSCOPY Left 04/09/2017   Procedure: COLONOSCOPY;  Surgeon: Carol Ada, MD;  Location: [place];  Service: Endoscopy;  Laterality: Left;  . ESOPHAGOGASTRODUODENOSCOPY N/A 04/03/2017   Procedure: ESOPHAGOGASTRODUODENOSCOPY (EGD);  Surgeon: Carol Ada, MD;  Location: [place];  Service: Endoscopy;  Laterality: N/A;  . ESOPHAGOGASTRODUODENOSCOPY (EGD) WITH PROPOFOL N/A 04/12/2017   Procedure:  ESOPHAGOGASTRODUODENOSCOPY (EGD) WITH PROPOFOL;  Surgeon: Milus Banister, MD;  Location: [facility] ENDOSCOPY;  Service: Endoscopy;  Laterality: N/A;  . IR ANGIOGRAM SELECTIVE EACH ADDITIONAL VESSEL  04/12/2017  . IR ANGIOGRAM SELECTIVE EACH ADDITIONAL VESSEL  04/13/2017  . IR ANGIOGRAM SELECTIVE EACH ADDITIONAL VESSEL  04/13/2017  . IR ANGIOGRAM SELECTIVE EACH ADDITIONAL VESSEL  04/13/2017  . IR ANGIOGRAM VISCERAL SELECTIVE  04/12/2017  . IR ANGIOGRAM VISCERAL SELECTIVE  04/13/2017  . IR EMBO ART  VEN HEMORR LYMPH EXTRAV  INC GUIDE ROADMAPPING  04/12/2017  . IR EMBO ART  VEN HEMORR LYMPH EXTRAV  INC GUIDE ROADMAPPING  04/13/2017  . IR FLUORO GUIDE CV LINE RIGHT  04/13/2017  . IR IVC FILTER PLMT / S&I /IMG GUID/MOD SED  04/12/2017  . IR US GUIDE VASC ACCESS RIGHT  04/12/2017  . IR US GUIDE VASC ACCESS RIGHT  04/13/2017  . IR US GUIDE VASC ACCESS RIGHT  04/13/2017  . LEG SURGERY  1974   Blood Clot Removal   . VIDEO BRONCHOSCOPY WITH ENDOBRONCHIAL ULTRASOUND N/A 05/19/2017   Procedure: VIDEO BRONCHOSCOPY WITH ENDOBRONCHIAL ULTRASOUND;  Surgeon: Grace Isaac, MD;  Location: [place];  Service: Thoracic;  Laterality: N/A;    SOCIAL HISTORY: Social History   Socioeconomic History  . Marital status: Single    Spouse name: Not on file  . Number of children: Not on file  . Years of education: Not on file  . Highest education level: Not on file  Occupational History  . Occupation: Has to lift heavy boxes at times.    Employer: [employer]  Social Needs  . Financial resource strain: Not on file  . Food insecurity:    Worry: Not on file    Inability: Not on file  . Transportation needs:    Medical: Not on file    Non-medical: Not on file  Tobacco Use  . Smoking status: Former Smoker    Packs/day: 0.50    Years: 40.00    Pack years: 20.00    Types: Cigarettes    Last attempt to quit: 03/31/2017    Years since quitting: 0.1  . Smokeless tobacco: Never Used  Substance and Sexual Activity  . Alcohol use: No     Frequency: Never  . Drug use: No  . Sexual activity: Not Currently  Lifestyle  . Physical activity:    Days per week: Not on file    Minutes per session: Not on file  . Stress: Not on file  Relationships  . Social connections:    Talks on phone: Not on file    Gets together: Not on file    Attends religious service: Not on file    Active member of club or organization: Not on file    Attends meetings of clubs or organizations: Not on file    Relationship status: Not on file  . Intimate partner violence:    Fear of current or ex partner: Not on file    Emotionally abused: Not on file    Physically abused: Not on file    Forced sexual activity: Not on file  Other Topics Concern  . Not on file  Social History Narrative  . Not on file    FAMILY HISTORY: Family History  Problem Relation Age of Onset  . Heart disease Father     ALLERGIES:  has No Known Allergies.  MEDICATIONS:  Current Outpatient Medications  Medication Sig Dispense Refill  . acetaminophen (TYLENOL) 325 MG tablet Take 1-2 tablets (325-650 mg total) by mouth every 4 (four) hours as needed for mild pain.    . budesonide-formoterol (SYMBICORT) 80-4.5 MCG/ACT inhaler Inhale 2 puffs into the lungs 2 (two) times daily. 1 Inhaler 12  . cholecalciferol (VITAMIN D) 1000 units tablet Take 1,000 Units by mouth 2 (two) times daily.    . collagenase (SANTYL) ointment Apply topically daily. To sacral area with damp to dry dressing. Change daily 15 g 0  . magnesium oxide (MAG-OX) 400 (241.3 Mg) MG tablet Take 0.5 tablets (200 mg total) by mouth 2 (two) times daily. 60 tablet 0  . polycarbophil (FIBERCON) 625 MG tablet Take 2 tablets (1,250 mg total) by mouth 2 (two) times daily. 120 tablet 0  . potassium chloride SA (K-DUR,KLOR-CON) 20 MEQ tablet Take 1 tablet (20 mEq total) by mouth 2 (two) times daily. 60 tablet 0  . saccharomyces boulardii (FLORASTOR) 250 MG capsule Take 1 capsule (250 mg total) by mouth 2 (two) times  daily. 100 capsule 0  . levETIRAcetam (KEPPRA) 500 MG tablet Take 1 tablet (500 mg total) by mouth 2 (two) times daily. 60 tablet 0  . pantoprazole (PROTONIX) 40 MG tablet Take 1 tablet (40 mg total) by mouth 2 (two) times daily. 60 tablet 0   No current facility-administered medications for this visit.     REVIEW OF SYSTEMS:    10 Point review of Systems was done is negative except as noted above.  PHYSICAL EXAMINATION: ECOG PERFORMANCE STATUS: 1 -  Symptomatic but completely ambulatory  . Vitals:   06/01/17 1406  BP: (!) 141/51  Pulse: 75  Resp: 18  Temp: 97.8 F (36.6 C)  SpO2: 95%   Filed Weights   06/01/17 1406  Weight: 127 lb 8 oz (57.8 kg)   .Body mass index is 22.59 kg/m.  GENERAL:alert, in no acute distress and comfortable SKIN: no acute rashes, no significant lesions EYES: conjunctiva are pink and non-injected, sclera anicteric OROPHARYNX: MMM, no exudates, no oropharyngeal erythema or ulceration NECK: supple, no JVD LYMPH:  no palpable lymphadenopathy in the cervical, axillary or inguinal regions LUNGS: clear to auscultation b/l with normal respiratory effort HEART: regular rate & rhythm ABDOMEN:  normoactive bowel sounds , non tender, not distended. Extremity: no pedal edema PSYCH: alert & oriented x 3 with fluent speech NEURO: no focal motor/sensory deficits  LABORATORY DATA:  I have reviewed the data as listed  . CBC Latest Ref Rng & Units 05/19/2017 04/30/2017 04/29/2017  WBC 4.0 - 10.5 K/uL 7.8 5.0 4.5  Hemoglobin 12.0 - 15.0 g/dL 12.6 8.6(L) 8.8(L)  Hematocrit 36.0 - 46.0 % 40.1 28.0(L) 28.5(L)  Platelets 150 - 400 K/uL 256 195 187    . CMP Latest Ref Rng & Units 05/19/2017 04/29/2017 04/27/2017  Glucose 65 - 99 mg/dL 102(H) 90 102(H)  BUN 6 - 20 mg/dL '10 11 13  '$ Creatinine 0.44 - 1.00 mg/dL 0.74 0.69 0.77  Sodium 135 - 145 mmol/L 138 142 141  Potassium 3.5 - 5.1 mmol/L 3.6 3.7 3.2(L)  Chloride 101 - 111 mmol/L 105 109 108  CO2 22 - 32 mmol/L '22  23 23  '$ Calcium 8.9 - 10.3 mg/dL 10.0 8.3(L) 8.8(L)  Total Protein 6.5 - 8.1 g/dL 7.2 - -  Total Bilirubin 0.3 - 1.2 mg/dL 0.7 - -  Alkaline Phos 38 - 126 U/L 78 - -  AST 15 - 41 U/L 28 - -  ALT 14 - 54 U/L 15 - -   05/19/17 Fine Needle Aspiration:  05/19/17 Bronchial Washing Specimen B:   RADIOGRAPHIC STUDIES: I have personally reviewed the radiological images as listed and agreed with the findings in the report. Dg Chest 2 View  Result Date: 05/19/2017 CLINICAL DATA:  Preoperative assessment pre biopsy. Hypertension. Atrial fibrillation. EXAM: CHEST - 2 VIEW COMPARISON:  PET-CT May 14, 2017; chest radiograph April 15, 2017 FINDINGS: There is subtle infiltrate in the left upper lobe, less apparent than on recent PET study. Lungs elsewhere are clear. Heart size and pulmonary vascularity are normal. There is a mass on the left at the level of the aortopulmonary window, better appreciated on CT. This lesion measures approximately 4.7 x 3.8 cm. No similar mass or adenopathy elsewhere. Postoperative changes are noted in the upper abdomen. No bone lesions. IMPRESSION: Mass in the middle mediastinum, better seen on recent PET study. Subtle infiltrate left upper lobe, best prominent than on recent PET study. Lungs elsewhere clear. Heart size normal. Electronically Signed   By: Lowella Grip III M.D.   On: 05/19/2017 12:12   Nm Pet Image Initial (pi) Skull Base To Thigh  Result Date: 05/15/2017 CLINICAL DATA:  Initial treatment strategy for mediastinal mass on CT. EXAM: NUCLEAR MEDICINE PET SKULL BASE TO THIGH TECHNIQUE: 6.7 mCi F-18 FDG was injected intravenously. Full-ring PET imaging was performed from the skull base to thigh after the radiotracer. CT data was obtained and used for attenuation correction and anatomic localization. Fasting blood glucose: 90 mg/dl Mediastinal blood pool activity: SUV max 2.1 COMPARISON:  Chest CT 04/07/2017.  Abdominopelvic CT 04/13/2017. FINDINGS: NECK: No  hypermetabolic cervical lymph nodes are identified.There is intense hypermetabolic activity anteriorly in the oral cavity associated with the tongue, probably physiologic. This has an SUV max of 17.3, however, more than typically seen. Correlation with physical examination recommended. There is also mildly increased metabolic activity within the left pterygoid musculature, also likely physiologic. No suspicious findings of the pharyngeal mucosal space identified on the CT images. Incidental CT findings: Mild carotid atherosclerosis. CHEST: The previously demonstrated 4.3 x 3.1 cm AP window mass is intensely hypermetabolic with an SUV max of 11.0. No other hypermetabolic mediastinal or axillary lymph nodes are demonstrated. There is new patchy ground-glass opacity in the lingula measuring up to 2.3 cm on image 29/8. This is also mildly hypermetabolic (SUV max 3.9). Because it is new from the previous chest CT, this is probably inflammatory. No suspicious pulmonary nodules are identified. Incidental CT findings: Moderate centrilobular emphysema. Atherosclerosis of the aorta, great vessels and coronary arteries. ABDOMEN/PELVIS: There is no hypermetabolic activity within the liver, adrenal glands, spleen or pancreas. There is no hypermetabolic nodal activity. As demonstrated on previous CT, there is mild asymmetric dilatation of the left renal collecting system and left ureter without high-grade obstruction. There is limited bladder evaluation by PET-CT, especially in this case as the bladder is nearly empty and there appears to be mild pelvic floor laxity. No pelvic mass identified. Incidental CT findings: Postsurgical changes in the left upper quadrant of the abdomen. IVC filter and severe aortic atherosclerosis. There is extensive calcification within the lumen of the abdominal aorta inferior to the renal arteries. SKELETON: There is no hypermetabolic activity to suggest osseous metastatic disease. Incidental CT  findings: none IMPRESSION: 1. The known mediastinal mass is intensely hypermetabolic, worrisome for malignancy. This could reflect lymphoma or small cell lung cancer. Tissue sampling recommended. 2. No peripheral lung mass identified. New patchy ground-glass opacity in the lingula is mildly hypermetabolic and likely inflammatory. 3. Intense activity associated with the anterior oral cavity, probably physiologic. Clinical correlation to exclude tongue lesion recommended. 4. Mild hydroureter on the left, similar to previous CT and possibly related to pelvic floor laxity. 5. Aortic Atherosclerosis (ICD10-I70.0). Severe involvement the infrarenal abdominal aorta. Electronically Signed   By: Richardean Sale M.D.   On: 05/15/2017 08:41   MRI brain 04/21/2017: IMPRESSION: 1. Motion degraded examination. 2. 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 3. Otherwise negative MRI of the head with and without contrast for age.  Electronically Signed   By: Elon Alas M.D.   On: 04/22/2017 00:34    ASSESSMENT & PLAN:   62 y.o. female with  1. Newly diagnosed Non-Small Cell Carcinoma on bronchoscopy/cytology. Not enough tissue for further characterization or mutation testing. Presenting with mediastinal nodal mass - TxN2 Mx (Atleast Stage III disease)  ? Lingular primary vs inflammation. No overt evidence of metastatic disease.  2. Recent SZ -currently on Keppra  3. Recent rt calf DVT - off anticoagulation due to massive GI bleeding. S/p IVC filter.  4. Recent GI bleeding from GU --needing clipping and arterial embolization. hgb stable today at 12.6. On PPI BID PLAN -patient is not keen on additional biopsy for further tumor characterization or molecular evaluation. -EGFR mutation studies on blood -Discussed patient's most recent labs  -Discussed the most recent 05/14/17 PET scan and 05/19/17 biopsy which revealed Non-small cell lung cancer  carcinoma.  -Discussed the staging of the carcinoma with pt and her husband  as being atleast a stage III.  -Discussed that the primary mass is in the center of the chest is within the aorto-pulmonary window and is close to important structures, including arteries, which would negate the possibility of surgery. -Discussed that radiation alone vs chemotherapy with radiation vs chemotherapy followed by RT would be treatment approaches available. -in the event of uncertainly regarding the primary tumor and?evolving lingular lesion -- would recommend neoadjuvant chemotherapy (Carbo/taxol 2-3 cycles)--followed by RT to residual disease if no other disease arises. -it would be tough for patient with her co-morbids and recent issues to tolerate concurrent chemo-radiation and RT alone will not address other sites of disease that are likely to emerge. -ECHO on 04/14/17 revealed that her heart function was normal.  -Will need to evaluate her lung capacity with a lung function test. -we will refer patient to [place] and have their input as well -Provided supplemental information to the pt and her husband.  -Discussed national cancer treatment guidelines for her stage of disease. -Discussed the possible side effects of chemotherapy and radiation, as well as the possibility of relapse.   Pulmonary function testing Radiation Oncology referral in 3-5 days RTC with Dr Irene Limbo with labs in 10 days to finalize treatment.   All of the patients questions were answered with apparent satisfaction. The patient knows to call the clinic with any problems, questions or concerns.  I spent 60 minutes counseling the patient face to face. The total time spent in the appointment was 80 minutes and more than 50% was on counseling and direct patient cares.    Sullivan Lone MD [place] AAHIVMS [facility] Hematology/Oncology Physician [facility]  (Office):       [phone] (Work cell):  [phone] (Fax):            [phone]  06/01/2017 10:49 AM  This document serves as a record of services personally performed by Sullivan Lone, MD. It was created on his behalf by Baldwin Jamaica, a trained medical scribe. The creation of this record is based on the scribe's personal observations and the provider's statements to them.   .I have reviewed the above documentation for accuracy and completeness, and I agree with the above. Brunetta Genera MD MS

## 2017-06-02 ENCOUNTER — Ambulatory Visit (HOSPITAL_COMMUNITY)
Admission: RE | Admit: 2017-06-02 | Discharge: 2017-06-02 | Disposition: A | Discharge status: Home / Self Care | Payer: BLUE CROSS/BLUE SHIELD | Source: Ambulatory Visit | Attending: Hematology | Admitting: Hematology

## 2017-06-02 DIAGNOSIS — C3490 Malignant neoplasm of unspecified part of unspecified bronchus or lung: Secondary | ICD-10-CM

## 2017-06-02 LAB — PULMONARY FUNCTION TEST
DL/VA % pred: 79 %
DL/VA: 3.71 ml/min/mmHg/L
DLCO unc % pred: 52 %
DLCO unc: 12.08 ml/min/mmHg
FEF 25-75 Post: 0.92 L/sec
FEF 25-75 Pre: 1.33 L/sec
FEF2575-%Change-Post: -30 %
FEF2575-%Pred-Post: 43 %
FEF2575-%Pred-Pre: 62 %
FEV1-%Change-Post: -4 %
FEV1-%Pred-Post: 61 %
FEV1-%Pred-Pre: 64 %
FEV1-Post: 1.43 L
FEV1-Pre: 1.5 L
FEV1FVC-%Change-Post: 2 %
FEV1FVC-%Pred-Pre: 95 %
FEV6-%Change-Post: -1 %
FEV6-%Pred-Post: 64 %
FEV6-%Pred-Pre: 66 %
FEV6-Post: 1.91 L
FEV6-Pre: 1.94 L
FEV6FVC-%Pred-Post: 104 %
FEV6FVC-%Pred-Pre: 104 %
FVC-%Change-Post: -6 %
FVC-%Pred-Post: 62 %
FVC-%Pred-Pre: 66 %
FVC-Post: 1.91 L
FVC-Pre: 2.05 L
Post FEV1/FVC ratio: 75 %
Post FEV6/FVC ratio: 100 %
Pre FEV1/FVC ratio: 73 %
Pre FEV6/FVC Ratio: 100 %
RV % pred: 131 %
RV: 2.65 L
TLC % pred: 92 %
TLC: 4.54 L

## 2017-06-02 MED ORDER — ALBUTEROL SULFATE (2.5 MG/3ML) 0.083% IN NEBU
2.5000 mg | INHALATION_SOLUTION | Freq: Once | RESPIRATORY_TRACT | Status: AC
  Administered 2017-06-02: 2.5 mg via RESPIRATORY_TRACT

## 2017-06-03 ENCOUNTER — Ambulatory Visit: Payer: BLUE CROSS/BLUE SHIELD | Admitting: Hematology

## 2017-06-03 ENCOUNTER — Encounter: Payer: Self-pay | Admitting: Radiation Oncology

## 2017-06-05 ENCOUNTER — Other Ambulatory Visit: Payer: Self-pay | Admitting: Radiation Therapy

## 2017-06-05 DIAGNOSIS — C383 Malignant neoplasm of mediastinum, part unspecified: Secondary | ICD-10-CM | POA: Insufficient documentation

## 2017-06-05 DIAGNOSIS — I6783 Posterior reversible encephalopathy syndrome: Secondary | ICD-10-CM

## 2017-06-05 NOTE — Progress Notes (Signed)
Thoracic Location of Tumor / Histology: Non-small cell carcinoma.  Started on seizure medication after having a seizure while in the hospital for a GI hemorrhage 04/02/17.    PET 10/09/9560: Hypermetabolic mediastinal mass, no peripheral lung mass identified. New patchy ground glass opacity in the lingula is mildly hypermetabolic and likely inflammatory.  Mild hydroureter on the left, similar to previous CT and possible related to pelvic floor laxity.  Bronchoscopy 05/19/2017: biopsy of hypermetabolic mediastinal mass, revealed non-small cell carcinoma.  CXR 05/19/2017: mass in the middle mediastinum, subtle infiltrate left upper lobe.  CT head 04/19/2017: No acute infract.  MRI Brain 04/22/2017: mild biparietal signal abnormality suspicious for posterior reversible encephalopathic syndrome, otherwise negative MRI.  CT scan 03/2017: suggested enlarged AP window lymph node.   Tobacco/Marijuana/Snuff/ETOH use: Smoked for 35-40 years, 1/2 pack a day.  Quit smoking on 03/31/2017.  Past/Anticipated interventions by cardiothoracic surgery, if any:  05/15/2017 [name]: Bronchoscopy ebus transbronchial biopsy of the left hilar mass 05/19/2017 with [name]   Past/Anticipated interventions by medical oncology, if any:  Dr. Irene Limbo 06/01/2017 -Not surgical candidate as the primary mass is in the center of the chest close to important structures.  -Evaluate cardiac and pulmonary function. -Referral to IR -Current treatment options would be radiation alone, or chemotherapy with radiation.  -Radiation oncology referral -RTC with Dr.Kale with labs in 10 days   Signs/Symptoms  Weight changes, if any: Lost roughly 30 pounds over the course of 3 months during her hospital stay.  Respiratory complaints, if any: Shortness of breath when she walks long distances.  Started symbicort after hospital stay  Hemoptysis, if any: Productive cough of yellow mucus.  Pain issues, if any:  Headaches on occasion,  takes tylenol.  BP (!) 147/50 (BP Location: Right Arm, Patient Position: Sitting, Cuff Size: Normal)   Pulse (!) 52   Temp 97.9 F (36.6 C) (Oral)   Resp 20   Ht 5\' 3"  (1.6 m)   Wt 128 lb 6.4 oz (58.2 kg)   SpO2 95%   BMI 22.75 kg/m    Wt Readings from Last 3 Encounters:  06/09/17 128 lb 6.4 oz (58.2 kg)  06/01/17 127 lb 8 oz (57.8 kg)  05/15/17 129 lb 6.4 oz (58.7 kg)    SAFETY ISSUES:  Prior radiation? No  Pacemaker/ICD? No  Possible current pregnancy? No  Is the patient on methotrexate? No   Current Complaints / other details:  Newly diagnosed non small cell carcinoma. Recent seizure while hospitalized started on Keppra, recent right calf DVT started on xarelto, xarelto stopped due to massive GI bleeding, arterial embolization and clipping completed, s/p IVC filter.

## 2017-06-09 ENCOUNTER — Encounter: Payer: Self-pay | Admitting: Radiation Oncology

## 2017-06-09 ENCOUNTER — Ambulatory Visit
Admission: RE | Admit: 2017-06-09 | Discharge: 2017-06-09 | Disposition: A | Discharge status: Home / Self Care | Payer: BLUE CROSS/BLUE SHIELD | Source: Ambulatory Visit | Attending: Radiation Oncology | Admitting: Radiation Oncology

## 2017-06-09 ENCOUNTER — Other Ambulatory Visit: Payer: Self-pay

## 2017-06-09 VITALS — BP 147/50 | HR 52 | Temp 97.9°F | Resp 20 | Ht 63.0 in | Wt 128.4 lb

## 2017-06-09 DIAGNOSIS — E559 Vitamin D deficiency, unspecified: Secondary | ICD-10-CM | POA: Diagnosis not present

## 2017-06-09 DIAGNOSIS — F419 Anxiety disorder, unspecified: Secondary | ICD-10-CM | POA: Insufficient documentation

## 2017-06-09 DIAGNOSIS — D62 Acute posthemorrhagic anemia: Secondary | ICD-10-CM | POA: Diagnosis not present

## 2017-06-09 DIAGNOSIS — R569 Unspecified convulsions: Secondary | ICD-10-CM | POA: Diagnosis not present

## 2017-06-09 DIAGNOSIS — Z87891 Personal history of nicotine dependence: Secondary | ICD-10-CM | POA: Diagnosis not present

## 2017-06-09 DIAGNOSIS — Z8249 Family history of ischemic heart disease and other diseases of the circulatory system: Secondary | ICD-10-CM | POA: Diagnosis not present

## 2017-06-09 DIAGNOSIS — I700 Atherosclerosis of aorta: Secondary | ICD-10-CM | POA: Diagnosis not present

## 2017-06-09 DIAGNOSIS — E7800 Pure hypercholesterolemia, unspecified: Secondary | ICD-10-CM | POA: Insufficient documentation

## 2017-06-09 DIAGNOSIS — R222 Localized swelling, mass and lump, trunk: Secondary | ICD-10-CM | POA: Diagnosis not present

## 2017-06-09 DIAGNOSIS — N134 Hydroureter: Secondary | ICD-10-CM | POA: Insufficient documentation

## 2017-06-09 DIAGNOSIS — C3492 Malignant neoplasm of unspecified part of left bronchus or lung: Secondary | ICD-10-CM

## 2017-06-09 DIAGNOSIS — Z9889 Other specified postprocedural states: Secondary | ICD-10-CM | POA: Diagnosis not present

## 2017-06-09 DIAGNOSIS — C3490 Malignant neoplasm of unspecified part of unspecified bronchus or lung: Secondary | ICD-10-CM | POA: Insufficient documentation

## 2017-06-09 DIAGNOSIS — Z79899 Other long term (current) drug therapy: Secondary | ICD-10-CM | POA: Insufficient documentation

## 2017-06-09 DIAGNOSIS — I481 Persistent atrial fibrillation: Secondary | ICD-10-CM | POA: Diagnosis not present

## 2017-06-09 NOTE — Progress Notes (Signed)
Radiation Oncology         [phone] ________________________________  Name: Kayla Wyatt        MRN: [id number]  Date of Service: 06/09/2017 DOB: 12-Mar-1955  [id number], Gwyndolyn Saxon, MD  Brunetta Genera, MD     REFERRING PHYSICIAN: Brunetta Genera, MD   DIAGNOSIS: The encounter diagnosis was Non-small cell carcinoma of lung ([place]).   HISTORY OF PRESENT ILLNESS: Kayla Wyatt is a 62 y.o. female seen at the request of Dr. Irene Limbo for a new diagnosis of lung cancer. The patient presented to the hospital on 04/02/17 with melena  and hematemesis. She had a prolonged course until her discharge on 04/22/17. During her stay she was found to have a DVT, mediastinal mass in the AP window measuring 4.1 cm. She had ecoli bacteremia and respiratory failure requiring intubation. She had acute blood loss anemia from a GI bleed and received multiple units of blood and clipping of a GI ulcer, as well and A Fib with RVR. On  2/11 she had a seizure and MRI of the Brain on 04/22/2017 revealed a mild biparietal signal abnormality suspicious for posterior reversible encephalopathic syndrome, otherwise negative MRI.  Following her discharge, she was at inpatient rehabilitation and was discharged home on 04/30/17. A PET scan on 05/14/2017 revealed I known mediastinal mass is intensely hypermetabolic with an SUV of 17, worrisome for malignancy. Also, a new patchy ground-glass opacity in the lingula is mildly hypermetabolic and likely inflammatory with an SUV of 3.9, measuring 2.3 cm. Intense activity associated with the anterior oral cavity, probably physiologic. She underwent a bronchoscopy on 05/19/2017 by [name] and biopsy showed a hypermetabolic mediastinal mass, revealed non-small cell carcinoma. She comes today to discuss options of treatment and will see Dr. Irene Limbo on Friday to move forward with chemotherapy.    PREVIOUS RADIATION THERAPY: No   PAST MEDICAL HISTORY:  Past Medical History:  Diagnosis  Date  . Acute blood loss anemia 04/02/2017  . Anxiety   . Gastrointestinal hemorrhage with melena   . Headache   . Hypercholesteremia   . Hypertension   . Persistent atrial fibrillation with rapid ventricular response ([place]) 04/07/2017  . Seizures ([place])    04/2017 while in hospital at [facility]  . Vitamin D deficiency        PAST SURGICAL HISTORY: Past Surgical History:  Procedure Laterality Date  . COLONOSCOPY Left 04/09/2017   Procedure: COLONOSCOPY;  Surgeon: Carol Ada, MD;  Location: [place];  Service: Endoscopy;  Laterality: Left;  . ESOPHAGOGASTRODUODENOSCOPY N/A 04/03/2017   Procedure: ESOPHAGOGASTRODUODENOSCOPY (EGD);  Surgeon: Carol Ada, MD;  Location: [place];  Service: Endoscopy;  Laterality: N/A;  . ESOPHAGOGASTRODUODENOSCOPY (EGD) WITH PROPOFOL N/A 04/12/2017   Procedure: ESOPHAGOGASTRODUODENOSCOPY (EGD) WITH PROPOFOL;  Surgeon: Milus Banister, MD;  Location: [facility] ENDOSCOPY;  Service: Endoscopy;  Laterality: N/A;  . IR ANGIOGRAM SELECTIVE EACH ADDITIONAL VESSEL  04/12/2017  . IR ANGIOGRAM SELECTIVE EACH ADDITIONAL VESSEL  04/13/2017  . IR ANGIOGRAM SELECTIVE EACH ADDITIONAL VESSEL  04/13/2017  . IR ANGIOGRAM SELECTIVE EACH ADDITIONAL VESSEL  04/13/2017  . IR ANGIOGRAM VISCERAL SELECTIVE  04/12/2017  . IR ANGIOGRAM VISCERAL SELECTIVE  04/13/2017  . IR EMBO ART  VEN HEMORR LYMPH EXTRAV  INC GUIDE ROADMAPPING  04/12/2017  . IR EMBO ART  VEN HEMORR LYMPH EXTRAV  INC GUIDE ROADMAPPING  04/13/2017  . IR FLUORO GUIDE CV LINE RIGHT  04/13/2017  . IR IVC FILTER PLMT / S&I /IMG GUID/MOD SED  04/12/2017  . IR  US GUIDE VASC ACCESS RIGHT  04/12/2017  . IR US GUIDE VASC ACCESS RIGHT  04/13/2017  . IR US GUIDE VASC ACCESS RIGHT  04/13/2017  . LEG SURGERY  1974   Blood Clot Removal   . VIDEO BRONCHOSCOPY WITH ENDOBRONCHIAL ULTRASOUND N/A 05/19/2017   Procedure: VIDEO BRONCHOSCOPY WITH ENDOBRONCHIAL ULTRASOUND;  Surgeon: Grace Isaac, MD;  Location: [place];  Service: Thoracic;  Laterality: N/A;      FAMILY HISTORY:  Family History  Problem Relation Age of Onset  . Heart disease Father      SOCIAL HISTORY:  reports that she quit smoking about 2 months ago. Her smoking use included cigarettes. She has a 20.00 pack-year smoking history. She has never used smokeless tobacco. She reports that she does not drink alcohol or use drugs. The patient is married and lives in [place]. She is out of work due to her previous hospitalization.   ALLERGIES: Patient has no known allergies.   MEDICATIONS:  Current Outpatient Medications  Medication Sig Dispense Refill  . acetaminophen (TYLENOL) 325 MG tablet Take 1-2 tablets (325-650 mg total) by mouth every 4 (four) hours as needed for mild pain.    . budesonide-formoterol (SYMBICORT) 80-4.5 MCG/ACT inhaler Inhale 2 puffs into the lungs 2 (two) times daily. 1 Inhaler 12  . cholecalciferol (VITAMIN D) 1000 units tablet Take 1,000 Units by mouth 2 (two) times daily.    . collagenase (SANTYL) ointment Apply topically daily. To sacral area with damp to dry dressing. Change daily 15 g 0  . magnesium oxide (MAG-OX) 400 (241.3 Mg) MG tablet Take 0.5 tablets (200 mg total) by mouth 2 (two) times daily. 60 tablet 0  . polycarbophil (FIBERCON) 625 MG tablet Take 2 tablets (1,250 mg total) by mouth 2 (two) times daily. 120 tablet 0  . potassium chloride SA (K-DUR,KLOR-CON) 20 MEQ tablet Take 1 tablet (20 mEq total) by mouth 2 (two) times daily. 60 tablet 0  . saccharomyces boulardii (FLORASTOR) 250 MG capsule Take 1 capsule (250 mg total) by mouth 2 (two) times daily. 100 capsule 0  . levETIRAcetam (KEPPRA) 500 MG tablet Take 1 tablet (500 mg total) by mouth 2 (two) times daily. 60 tablet 0  . levETIRAcetam (KEPPRA) 500 MG tablet TK 1 T PO BID  6  . pantoprazole (PROTONIX) 40 MG tablet Take 1 tablet (40 mg total) by mouth 2 (two) times daily. 60 tablet 0  . pantoprazole (PROTONIX) 40 MG tablet TK 1 T PO BID  6   No current facility-administered  medications for this encounter.      REVIEW OF SYSTEMS: On review of systems, the patient reports that she is doing well overall. She denies any chest pain, shortness of breath, cough, fevers, chills, night sweats. She denies any known unintended weight changes prior to her hospitalization but did lose about 20 pounds during her hospital stay. She denies any bowel or bladder disturbances, and denies abdominal pain, nausea or vomiting. She denies any new musculoskeletal or joint aches or pains. The patient presents today with her husband. She inquired about how long the radiation treatments will take because she stated that she is not working and is concerned about her insurance with [employer] and [employer], which she admits runs out by the end of this month.  A complete review of systems is obtained and is otherwise negative.   PHYSICAL EXAM:  Wt Readings from Last 3 Encounters:  06/09/17 128 lb 6.4 oz (58.2 kg)  06/01/17 127  lb 8 oz (57.8 kg)  05/15/17 129 lb 6.4 oz (58.7 kg)   Temp Readings from Last 3 Encounters:  06/09/17 97.9 F (36.6 C) (Oral)  06/01/17 97.8 F (36.6 C) (Oral)  05/19/17 (!) 97.5 F (36.4 C)   BP Readings from Last 3 Encounters:  06/09/17 (!) 147/50  06/01/17 (!) 141/51  05/19/17 100/77   Pulse Readings from Last 3 Encounters:  06/09/17 (!) 52  06/01/17 75  05/19/17 (!) 107   Pain Assessment Pain Score: 0-No pain/10  In general this is a well appearing caucasian female in no acute distress. She is alert and oriented x4 and appropriate throughout the examination. HEENT reveals that the patient is normocephalic, atraumatic. EOMs are intact. PERRLA. Skin is intact without any evidence of gross lesions. Cardiovascular exam reveals a regular rate and rhythm, no clicks rubs or murmurs are auscultated. Chest is clear to auscultation bilaterally. Lymphatic assessment is performed and does not reveal any adenopathy in the cervical, supraclavicular, axillary, or  inguinal chains. Abdomen has active bowel sounds in all quadrants and is intact. The abdomen is soft, non tender, non distended. Lower extremities are negative for pretibial pitting edema, deep calf tenderness, cyanosis or clubbing.   ECOG = 0  0 - Asymptomatic (Fully active, able to carry on all predisease activities without restriction)  1 - Symptomatic but completely ambulatory (Restricted in physically strenuous activity but ambulatory and able to carry out work of a light or sedentary nature. For example, light housework, office work)  2 - Symptomatic, <50% in bed during the day (Ambulatory and capable of all self care but unable to carry out any work activities. Up and about more than 50% of waking hours)  3 - Symptomatic, >50% in bed, but not bedbound (Capable of only limited self-care, confined to bed or chair 50% or more of waking hours)  4 - Bedbound (Completely disabled. Cannot carry on any self-care. Totally confined to bed or chair)  5 - Death   Eustace Pen MM, Creech RH, Tormey DC, et al. [phone]). "Toxicity and response criteria of the [facility]". [place] Oncol. 5 (6): 649-55    LABORATORY DATA:  Lab Results  Component Value Date   WBC 7.8 05/19/2017   HGB 12.6 05/19/2017   HCT 40.1 05/19/2017   MCV 95.0 05/19/2017   PLT 256 05/19/2017   Lab Results  Component Value Date   NA 138 05/19/2017   K 3.6 05/19/2017   CL 105 05/19/2017   CO2 22 05/19/2017   Lab Results  Component Value Date   ALT 15 05/19/2017   AST 28 05/19/2017   ALKPHOS 78 05/19/2017   BILITOT 0.7 05/19/2017      RADIOGRAPHY: Dg Chest 2 View  Result Date: 05/19/2017 CLINICAL DATA:  Preoperative assessment pre biopsy. Hypertension. Atrial fibrillation. EXAM: CHEST - 2 VIEW COMPARISON:  PET-CT May 14, 2017; chest radiograph April 15, 2017 FINDINGS: There is subtle infiltrate in the left upper lobe, less apparent than on recent PET study. Lungs elsewhere are clear. Heart  size and pulmonary vascularity are normal. There is a mass on the left at the level of the aortopulmonary window, better appreciated on CT. This lesion measures approximately 4.7 x 3.8 cm. No similar mass or adenopathy elsewhere. Postoperative changes are noted in the upper abdomen. No bone lesions. IMPRESSION: Mass in the middle mediastinum, better seen on recent PET study. Subtle infiltrate left upper lobe, best prominent than on recent PET study. Lungs elsewhere clear.  Heart size normal. Electronically Signed   By: Lowella Grip III M.D.   On: 05/19/2017 12:12   Nm Pet Image Initial (pi) Skull Base To Thigh  Result Date: 05/15/2017 CLINICAL DATA:  Initial treatment strategy for mediastinal mass on CT. EXAM: NUCLEAR MEDICINE PET SKULL BASE TO THIGH TECHNIQUE: 6.7 mCi F-18 FDG was injected intravenously. Full-ring PET imaging was performed from the skull base to thigh after the radiotracer. CT data was obtained and used for attenuation correction and anatomic localization. Fasting blood glucose: 90 mg/dl Mediastinal blood pool activity: SUV max 2.1 COMPARISON:  Chest CT 04/07/2017.  Abdominopelvic CT 04/13/2017. FINDINGS: NECK: No hypermetabolic cervical lymph nodes are identified.There is intense hypermetabolic activity anteriorly in the oral cavity associated with the tongue, probably physiologic. This has an SUV max of 17.3, however, more than typically seen. Correlation with physical examination recommended. There is also mildly increased metabolic activity within the left pterygoid musculature, also likely physiologic. No suspicious findings of the pharyngeal mucosal space identified on the CT images. Incidental CT findings: Mild carotid atherosclerosis. CHEST: The previously demonstrated 4.3 x 3.1 cm AP window mass is intensely hypermetabolic with an SUV max of 11.0. No other hypermetabolic mediastinal or axillary lymph nodes are demonstrated. There is new patchy ground-glass opacity in the lingula  measuring up to 2.3 cm on image 29/8. This is also mildly hypermetabolic (SUV max 3.9). Because it is new from the previous chest CT, this is probably inflammatory. No suspicious pulmonary nodules are identified. Incidental CT findings: Moderate centrilobular emphysema. Atherosclerosis of the aorta, great vessels and coronary arteries. ABDOMEN/PELVIS: There is no hypermetabolic activity within the liver, adrenal glands, spleen or pancreas. There is no hypermetabolic nodal activity. As demonstrated on previous CT, there is mild asymmetric dilatation of the left renal collecting system and left ureter without high-grade obstruction. There is limited bladder evaluation by PET-CT, especially in this case as the bladder is nearly empty and there appears to be mild pelvic floor laxity. No pelvic mass identified. Incidental CT findings: Postsurgical changes in the left upper quadrant of the abdomen. IVC filter and severe aortic atherosclerosis. There is extensive calcification within the lumen of the abdominal aorta inferior to the renal arteries. SKELETON: There is no hypermetabolic activity to suggest osseous metastatic disease. Incidental CT findings: none IMPRESSION: 1. The known mediastinal mass is intensely hypermetabolic, worrisome for malignancy. This could reflect lymphoma or small cell lung cancer. Tissue sampling recommended. 2. No peripheral lung mass identified. New patchy ground-glass opacity in the lingula is mildly hypermetabolic and likely inflammatory. 3. Intense activity associated with the anterior oral cavity, probably physiologic. Clinical correlation to exclude tongue lesion recommended. 4. Mild hydroureter on the left, similar to previous CT and possibly related to pelvic floor laxity. 5. Aortic Atherosclerosis (ICD10-I70.0). Severe involvement the infrarenal abdominal aorta. Electronically Signed   By: Richardean Sale M.D.   On: 05/15/2017 08:41       IMPRESSION/PLAN: 1. At least Stage IIA,  [id number], NSCLC of the AP window with probable inflammatory change of the lingula. Dr. Lisbeth Renshaw discusses the pathology findings and reviews the nature of her disease and course which has been quite complicated. He discusses the rationale for proceeding with 2 courses of chemotherapy and following this repeat a diagnostic CT of the chest to confirm resolution of the findings in the lingula. He agrees with then following with a course of concurrent chemoRT. We will share this with Dr. Irene Limbo. We discussed the risks, benefits, short, and long term effects  of radiotherapy, and the patient is interested in proceeding at the appropriate interval. Dr. Lisbeth Renshaw discusses the delivery and logistics of radiotherapy and anticipates a course of 6 1/2 weeks. We will see her back in preparation for simulation toward the end of May or early June. Written consent is obtained and placed in the chart, a copy was provided to the patient. 2.         PRES. The patient had features of reversible encephalopathy and will proceed with repeat MRI brain on 07/10/2107. We will review this in brain/spine oncology conference and share recommendations with the patient. 3.         Film/video editor. The patient will meet with social work for recommendations on seeking health care coverage.  4. Recent issues of A. Fib, GI bleed, and DVT. She will continue with medical oncology and her PCP regarding ongoing follow up for this.    The above documentation reflects my direct findings during this shared patient visit. Please see the separate note by Dr. Lisbeth Renshaw on this date for the remainder of the patient's plan of care.    Carola Rhine, PAC This document serves as a record of services personally performed by Shona Simpson, PA-C and Kyung Rudd, MD. It was created on their behalf by Valeta Harms, a trained medical scribe. The creation of this record is based on the scribe's personal observations and the providers' statements to them. This  document has been checked and approved by the attending provider.

## 2017-06-11 NOTE — Progress Notes (Signed)
HEMATOLOGY/ONCOLOGY CLINIC NOTE  Date of Service: 06/12/17  Patient Care Team: [name], MD as PCP - General (Family Medicine)  CHIEF COMPLAINTS/PURPOSE OF CONSULTATION:  Newly diagnosed Non-Small Cell Carcinoma  HISTORY OF PRESENTING ILLNESS:   Kayla Wyatt is a wonderful 62 y.o. female who has been referred to [place] by Dr [name] for evaluation and management of Non-Small Cell Carcinoma. She is accompanied today by her husband. The pt reports that she is doing well overall.   Patient recently had a significant hospitalization on 04/02/2017 - as per DC summary by [name] " with abdominal pain, hematemesis, melena and ABLA due to GIB.  She underwent clipping of gastric ulcer and received multiple units PRBC but continued to have drop in H/H with bloody stool. NM GI scan was negative for bleeding and she underwent colonoscopy by Dr. [name] 1/31 showing diverticulosis but no signs of bleeding. Hospital course significant for R- gastrocnemius DVT, PAF, incidental findings of mediastinal mass as well as brief episode of hematuria. She was cleared to [facility] on Xarelto on 02/01 for treatment of DVT. On 2/2 am she developed hematemesis with maroon stools and hypotension requiring fluid bolus as well as 2 units PRBCs. She continued to decline requiring intubation, pressors as well as reversal of anticoagulation with Kcentra on 02/3. She underwent UGI revealing large clot with fresh blood in stomach and underwent mesenteric arteriogram with percutaneous coil embolization fo distal tributary of left gastric artery and placement of IVC filter by interventional radiology.   A fib with RVR felt to be due to hemorrhagic shock and she converted to NSR. [name] felt no further cardiac work up indicated and did not recommend initiating anticoagulation in short term. To follow up with [name] after PET scan and routine biopsy after medically stable. She continued to have bleeding and  underwent visceral angiogram with embolization of inferior division of splenic artery and associated gastric arteries on 2/4. No surgical intervention needed per [name] with recommendations to continue monitoring H/H as well as for recurrence of hematochezia. She tolerated extubation on 2/6 and respiratory status improving.  On 2/11, she has episode of unresponsiveness with jerking of bilateral limbs that lasted about 3 minutes. She had amnesia of events but was back to baseline. EEG revealed "focal slowing over the right temporo-occipital regionandoccasional epileptiform discharges over the right occipital region". Head CT reviewed, unremarkable for acute intracranial process. MRI brain done revealing mild biparietal signal abnormality suspicious for PRES. [name] recommended starting patient on Keppra BID with repeat MRI in 2 weeks. New onset seizure likely provoked by PRES--if resolved--Ok to take patient off Keppra. Patient with resultant generalized weakness. CIR recommended due to functional deficits. "   Later as outpatient she had a PET scan on 05/14/17 which showed a hypermetabolic mediastinal mass which was subsequently biopsied with a bronchoscopy on 05/19/17. She notes that she had smoked for about 35-40 years, half a pack of a day. She reports that she has not had any other symptoms related to her smoking; she has never had to be on home oxygen or inhalers. She has recently begun Symbicort. While being in the hospital she lost about 20 lbs but has maintained her weight since being discharged.  The pt and her husband note that she is concerned about what to do if she is not able to go back to work after her 90-day leave. We recommended that they speak with our social workers here to explore the available  options for insurance coverage /disability application etc.  Of note prior to the patient's visit today, pt has had PET/CT completed on 05/14/17 with results revealing 1. The known  mediastinal mass is intensely hypermetabolic, worrisome for malignancy. This could reflect lymphoma or small cell lung cancer. Tissue sampling recommended.2. No peripheral lung mass identified. New patchy ground-glass opacity in the lingula is mildly hypermetabolic and likely inflammatory.  On 05/19/17 the pt had a Fine Needle Aspiration which revealed Non-Small cell lung carcinoma.  Most recent lab results (05/19/17) of CBC  is as follows: all values are WNL except for RBC at 2.90, Hgb at 8.6, HCT at 28.0, RDW at 16.4.  On review of systems, pt reports knee pain, and denies abdominal pains, leg swelling, SOB, CP, and any other symptoms.   On PMHx the pt reports acute blood loss anemia, GI hemorrhage with melena, hypercholesteremia, HTN, persistent atrial fibrillation with rapid ventricular response, Vitamin D deficiency, denies liver and kidney problems. On Social Hx the pt reports having smoked for 35-40 years of half a pack per day. She quit smoking on 03/31/17.  On Family Hx the pt reports that her father had heart disease.  Interval History:  Kayla Wyatt returns today regarding her Non-Small Cell Carcinoma. The patient's last visit with [place] was on 06/01/17. She is accompanied today by her husband. The pt reports that she is doing well overall.   The pt reports that she has spoken with radiology and Dr. Lisbeth Renshaw since our last visit. The pt also notes that she has not yet been able to speak with the social worker and we will put her in touch with our social worker today.   She notes that she will be seeing Dr Posey Pronto next week for rehabilitation and will also see Dr Stanford Breed with cardiology next week as well.   The pt reports that her A fib wasn't constant, and has had isolated moments.   Of note since the patient's last visit, pt has had Pulmonary Function Test completed on 05/2617.   Lab results today (06/12/17) of CBC, CMP, and Reticulocytes is as follows: all values are WNL except for  Albumin at 3.4, Retic Count Abs at 28.8. Ferritin 4/5/9 is at 36.  Iron and TIBC 06/12/17 shows all values WNL except for Sat Ratio at 14%  On review of systems, pt reports moving her bowels well, eating well, and denies changes in breathing, CP, blood in the stools, recent seizures, abdominal pains, leg swelling and any other symptoms.    MEDICAL HISTORY:  Past Medical History:  Diagnosis Date  . Acute blood loss anemia 04/02/2017  . Anxiety   . Gastrointestinal hemorrhage with melena   . Headache   . Hypercholesteremia   . Hypertension   . Persistent atrial fibrillation with rapid ventricular response ([place]) 04/07/2017  . Seizures ([place])    04/2017 while in hospital at [facility]  . Vitamin D deficiency     SURGICAL HISTORY: Past Surgical History:  Procedure Laterality Date  . COLONOSCOPY Left 04/09/2017   Procedure: COLONOSCOPY;  Surgeon: Carol Ada, MD;  Location: [place];  Service: Endoscopy;  Laterality: Left;  . ESOPHAGOGASTRODUODENOSCOPY N/A 04/03/2017   Procedure: ESOPHAGOGASTRODUODENOSCOPY (EGD);  Surgeon: Carol Ada, MD;  Location: [place];  Service: Endoscopy;  Laterality: N/A;  . ESOPHAGOGASTRODUODENOSCOPY (EGD) WITH PROPOFOL N/A 04/12/2017   Procedure: ESOPHAGOGASTRODUODENOSCOPY (EGD) WITH PROPOFOL;  Surgeon: Milus Banister, MD;  Location: [facility] ENDOSCOPY;  Service: Endoscopy;  Laterality: N/A;  . IR ANGIOGRAM SELECTIVE  EACH ADDITIONAL VESSEL  04/12/2017  . IR ANGIOGRAM SELECTIVE EACH ADDITIONAL VESSEL  04/13/2017  . IR ANGIOGRAM SELECTIVE EACH ADDITIONAL VESSEL  04/13/2017  . IR ANGIOGRAM SELECTIVE EACH ADDITIONAL VESSEL  04/13/2017  . IR ANGIOGRAM VISCERAL SELECTIVE  04/12/2017  . IR ANGIOGRAM VISCERAL SELECTIVE  04/13/2017  . IR EMBO ART  VEN HEMORR LYMPH EXTRAV  INC GUIDE ROADMAPPING  04/12/2017  . IR EMBO ART  VEN HEMORR LYMPH EXTRAV  INC GUIDE ROADMAPPING  04/13/2017  . IR FLUORO GUIDE CV LINE RIGHT  04/13/2017  . IR IVC FILTER PLMT / S&I /IMG GUID/MOD SED  04/12/2017  . IR US  GUIDE VASC ACCESS RIGHT  04/12/2017  . IR US GUIDE VASC ACCESS RIGHT  04/13/2017  . IR US GUIDE VASC ACCESS RIGHT  04/13/2017  . LEG SURGERY  1974   Blood Clot Removal   . VIDEO BRONCHOSCOPY WITH ENDOBRONCHIAL ULTRASOUND N/A 05/19/2017   Procedure: VIDEO BRONCHOSCOPY WITH ENDOBRONCHIAL ULTRASOUND;  Surgeon: Grace Isaac, MD;  Location: [place];  Service: Thoracic;  Laterality: N/A;    SOCIAL HISTORY: Social History   Socioeconomic History  . Marital status: Single    Spouse name: Not on file  . Number of children: Not on file  . Years of education: Not on file  . Highest education level: Not on file  Occupational History  . Occupation: Has to lift heavy boxes at times.    Employer: [employer]  Social Needs  . Financial resource strain: Not on file  . Food insecurity:    Worry: Not on file    Inability: Not on file  . Transportation needs:    Medical: Not on file    Non-medical: Not on file  Tobacco Use  . Smoking status: Former Smoker    Packs/day: 0.50    Years: 40.00    Pack years: 20.00    Types: Cigarettes    Last attempt to quit: 03/31/2017    Years since quitting: 0.2  . Smokeless tobacco: Never Used  Substance and Sexual Activity  . Alcohol use: No    Frequency: Never  . Drug use: No  . Sexual activity: Not Currently  Lifestyle  . Physical activity:    Days per week: Not on file    Minutes per session: Not on file  . Stress: Not on file  Relationships  . Social connections:    Talks on phone: Not on file    Gets together: Not on file    Attends religious service: Not on file    Active member of club or organization: Not on file    Attends meetings of clubs or organizations: Not on file    Relationship status: Not on file  . Intimate partner violence:    Fear of current or ex partner: Not on file    Emotionally abused: Not on file    Physically abused: Not on file    Forced sexual activity: Not on file  Other Topics Concern  . Not on file  Social History  Narrative  . Not on file    FAMILY HISTORY: Family History  Problem Relation Age of Onset  . Heart disease Father     ALLERGIES:  has No Known Allergies.  MEDICATIONS:  Current Outpatient Medications  Medication Sig Dispense Refill  . acetaminophen (TYLENOL) 325 MG tablet Take 1-2 tablets (325-650 mg total) by mouth every 4 (four) hours as needed for mild pain.    . budesonide-formoterol (SYMBICORT) 80-4.5 MCG/ACT inhaler  Inhale 2 puffs into the lungs 2 (two) times daily. 1 Inhaler 12  . cholecalciferol (VITAMIN D) 1000 units tablet Take 1,000 Units by mouth 2 (two) times daily.    . collagenase (SANTYL) ointment Apply topically daily. To sacral area with damp to dry dressing. Change daily 15 g 0  . levETIRAcetam (KEPPRA) 500 MG tablet TK 1 T PO BID  6  . magnesium oxide (MAG-OX) 400 (241.3 Mg) MG tablet Take 0.5 tablets (200 mg total) by mouth 2 (two) times daily. 60 tablet 0  . pantoprazole (PROTONIX) 40 MG tablet TK 1 T PO BID  6  . polycarbophil (FIBERCON) 625 MG tablet Take 2 tablets (1,250 mg total) by mouth 2 (two) times daily. 120 tablet 0  . potassium chloride SA (K-DUR,KLOR-CON) 20 MEQ tablet Take 1 tablet (20 mEq total) by mouth 2 (two) times daily. 60 tablet 0  . saccharomyces boulardii (FLORASTOR) 250 MG capsule Take 1 capsule (250 mg total) by mouth 2 (two) times daily. 100 capsule 0   No current facility-administered medications for this visit.     REVIEW OF SYSTEMS:   .10 Point review of Systems was done is negative except as noted above.  PHYSICAL EXAMINATION: ECOG PERFORMANCE STATUS: 1 - Symptomatic but completely ambulatory  . Vitals:   06/12/17 1203  BP: (!) 133/46  Pulse: (!) 58  Resp: 17  Temp: 97.8 F (36.6 C)  SpO2: 98%   Filed Weights   06/12/17 1203  Weight: 127 lb 8 oz (57.8 kg)   .Body mass index is 22.59 kg/m. [name] GENERAL:alert, in no acute distress and comfortable SKIN: no acute rashes, no significant lesions EYES: conjunctiva are pink  and non-injected, sclera anicteric OROPHARYNX: MMM, no exudates, no oropharyngeal erythema or ulceration NECK: supple, no JVD LYMPH:  no palpable lymphadenopathy in the cervical, axillary or inguinal regions LUNGS: clear to auscultation b/l with normal respiratory effort HEART: regular rate & rhythm ABDOMEN:  normoactive bowel sounds , non tender, not distended. Extremity: no pedal edema PSYCH: alert & oriented x 3 with fluent speech NEURO: no focal motor/sensory deficits   LABORATORY DATA:  I have reviewed the data as listed  . CBC Latest Ref Rng & Units 06/12/2017 05/19/2017 04/30/2017  WBC 3.9 - 10.3 K/uL 5.8 7.8 5.0  Hemoglobin 12.0 - 15.0 g/dL - 12.6 8.6(L)  Hematocrit 34.8 - 46.6 % 37.5 40.1 28.0(L)  Platelets 145 - 400 K/uL 204 256 195  HGB 12.1  . CMP Latest Ref Rng & Units 06/12/2017 05/19/2017 04/29/2017  Glucose 70 - 140 mg/dL 88 102(H) 90  BUN 7 - 26 mg/dL _0 Creatinine 0.60 - 1.10 mg/dL 0.71 0.74 0.69  Sodium 136 - 145 mmol/L 140 138 142  Potassium 3.5 - 5.1 mmol/L 3.8 3.6 3.7  Chloride 98 - 109 mmol/L 105 105 109  CO2 22 - 29 mmol/L _1 Calcium 8.4 - 10.4 mg/dL 10.1 10.0 8.3(L)  Total Protein 6.4 - 8.3 g/dL 7.0 7.2 -  Total Bilirubin 0.2 - 1.2 mg/dL 0.4 0.7 -  Alkaline Phos 40 - 150 U/L 80 78 -  AST 5 - 34 U/L 14 28 -  ALT 0 - 55 U/L 9 15 -   05/19/17 Fine Needle Aspiration:  05/19/17 Bronchial Washing Specimen B:   RADIOGRAPHIC STUDIES: I have personally reviewed the radiological images as listed and agreed with the findings in the report. Dg Chest 2 View  Result Date: 05/19/2017 CLINICAL DATA:  Preoperative assessment pre  biopsy. Hypertension. Atrial fibrillation. EXAM: CHEST - 2 VIEW COMPARISON:  PET-CT May 14, 2017; chest radiograph April 15, 2017 FINDINGS: There is subtle infiltrate in the left upper lobe, less apparent than on recent PET study. Lungs elsewhere are clear. Heart size and pulmonary vascularity are normal. There is a mass on the  left at the level of the aortopulmonary window, better appreciated on CT. This lesion measures approximately 4.7 x 3.8 cm. No similar mass or adenopathy elsewhere. Postoperative changes are noted in the upper abdomen. No bone lesions. IMPRESSION: Mass in the middle mediastinum, better seen on recent PET study. Subtle infiltrate left upper lobe, best prominent than on recent PET study. Lungs elsewhere clear. Heart size normal. Electronically Signed   By: Lowella Grip III M.D.   On: 05/19/2017 12:12   Nm Pet Image Initial (pi) Skull Base To Thigh  Result Date: 05/15/2017 CLINICAL DATA:  Initial treatment strategy for mediastinal mass on CT. EXAM: NUCLEAR MEDICINE PET SKULL BASE TO THIGH TECHNIQUE: 6.7 mCi F-18 FDG was injected intravenously. Full-ring PET imaging was performed from the skull base to thigh after the radiotracer. CT data was obtained and used for attenuation correction and anatomic localization. Fasting blood glucose: 90 mg/dl Mediastinal blood pool activity: SUV max 2.1 COMPARISON:  Chest CT 04/07/2017.  Abdominopelvic CT 04/13/2017. FINDINGS: NECK: No hypermetabolic cervical lymph nodes are identified.There is intense hypermetabolic activity anteriorly in the oral cavity associated with the tongue, probably physiologic. This has an SUV max of 17.3, however, more than typically seen. Correlation with physical examination recommended. There is also mildly increased metabolic activity within the left pterygoid musculature, also likely physiologic. No suspicious findings of the pharyngeal mucosal space identified on the CT images. Incidental CT findings: Mild carotid atherosclerosis. CHEST: The previously demonstrated 4.3 x 3.1 cm AP window mass is intensely hypermetabolic with an SUV max of 11.0. No other hypermetabolic mediastinal or axillary lymph nodes are demonstrated. There is new patchy ground-glass opacity in the lingula measuring up to 2.3 cm on image 29/8. This is also mildly  hypermetabolic (SUV max 3.9). Because it is new from the previous chest CT, this is probably inflammatory. No suspicious pulmonary nodules are identified. Incidental CT findings: Moderate centrilobular emphysema. Atherosclerosis of the aorta, great vessels and coronary arteries. ABDOMEN/PELVIS: There is no hypermetabolic activity within the liver, adrenal glands, spleen or pancreas. There is no hypermetabolic nodal activity. As demonstrated on previous CT, there is mild asymmetric dilatation of the left renal collecting system and left ureter without high-grade obstruction. There is limited bladder evaluation by PET-CT, especially in this case as the bladder is nearly empty and there appears to be mild pelvic floor laxity. No pelvic mass identified. Incidental CT findings: Postsurgical changes in the left upper quadrant of the abdomen. IVC filter and severe aortic atherosclerosis. There is extensive calcification within the lumen of the abdominal aorta inferior to the renal arteries. SKELETON: There is no hypermetabolic activity to suggest osseous metastatic disease. Incidental CT findings: none IMPRESSION: 1. The known mediastinal mass is intensely hypermetabolic, worrisome for malignancy. This could reflect lymphoma or small cell lung cancer. Tissue sampling recommended. 2. No peripheral lung mass identified. New patchy ground-glass opacity in the lingula is mildly hypermetabolic and likely inflammatory. 3. Intense activity associated with the anterior oral cavity, probably physiologic. Clinical correlation to exclude tongue lesion recommended. 4. Mild hydroureter on the left, similar to previous CT and possibly related to pelvic floor laxity. 5. Aortic Atherosclerosis (ICD10-I70.0). Severe involvement the infrarenal abdominal  aorta. Electronically Signed   By: Richardean Sale M.D.   On: 05/15/2017 08:41   MRI brain 04/21/2017: IMPRESSION: 1. Motion degraded examination. 2. Mild biparietal signal abnormality  suspicious for posterior reversible encephalopathic syndrome. 3. Otherwise negative MRI of the head with and without contrast for age.  Electronically Signed   By: Elon Alas M.D.   On: 04/22/2017 00:34    ASSESSMENT & PLAN:   62 y.o. female with  1. Newly diagnosed Non-Small Cell Carcinoma on bronchoscopy/cytology. Not enough tissue for further characterization or mutation testing. Presenting with mediastinal nodal mass - TxN2 Mx (Atleast Stage III disease)  ? Lingular primary vs inflammation. No overt evidence of metastatic disease.  2. Recent SZ -currently on Keppra  3. Recent rt calf DVT - off anticoagulation due to massive GI bleeding. S/p IVC filter.  4. Recent GI bleeding from GU --needing clipping and arterial embolization. hgb stable today at 12.6. On PPI BID PLAN -patient is not keen on additional biopsy for further tumor characterization or molecular evaluation. -EGFR mutation studies on blood -Discussed the most recent 05/14/17 PET scan and 05/19/17 biopsy which revealed Non-small cell lung cancer carcinoma.  -Discussed the staging of the carcinoma with pt and her husband as being atleast a stage III.  -Discussed that the primary mass is in the center of the chest is within the aorto-pulmonary window and is close to important structures, including arteries, which would negate the possibility of surgery. -Discussed that radiation alone vs chemotherapy with radiation vs chemotherapy followed by RT would be treatment approaches available. -in the event of uncertainly regarding the primary tumor and?evolving lingular lesion -- would recommend neoadjuvant chemotherapy (Carbo/taxol 2-3 cycles)--followed by RT to residual disease if no other disease arises. -it would be tough for patient with her co-morbids and recent issues to tolerate concurrent chemo-radiation and RT alone will not address other sites of disease that are likely to emerge. -ECHO on 04/14/17 revealed that  her heart function was normal.  -Will need to evaluate her lung capacity with a lung function test. -we will refer patient to [place] and have their input as well -Provided supplemental information to the pt and her husband.  -Discussed national cancer treatment guidelines for her stage of disease. -Discussed the possible side effects of chemotherapy and radiation, as well as the possibility of relapse.  -Discussed pt labwork today; CBC and CMP is all WNL except for Albumin at 3.4.  -Discussed that our recommendation is 1-2 months of chemotherapy, evaluate disease stability, and radiate specific spots following this. -Discussed that chemotherapy will be once every 3 weeks   Chemo-counseling for Carboplatin/Taxol in 5 days Schedule Carboplatin/Taxol with G-CSF support to [facility] within 1 week (will be q3weeks x 3 cycles) followed by RT -f/u with Dr Lebron Conners with labs in 1 week post chemotherapy for toxicity check -urgent Social worker consultation due to financial concerns.  Chemo-counseling for Carboplatin/Taxol in 5 days Schedule Carboplatin/Taxol with G-CSF support to [facility] within 1 week (will be q3weeks x 3 cycles) followed by RT -f/u with Dr Lebron Conners with labs in 1 week post chemotherapy for toxicity check -RTC with Dr Irene Limbo with C2D1 with labs -urgent Social worker consultation due to financial concerns.  All of the patients questions were answered with apparent satisfaction. The patient knows to call the clinic with any problems, questions or concerns.  I spent 20 minutes counseling the pt face to face. The toal time spent in the appt was 25 minutes and more than  50% was on counseling and direct patient cares.    Sullivan Lone MD [place] AAHIVMS [facility] Hematology/Oncology Physician [facility]  (Office):       [phone] (Work cell):  [phone] (Fax):           [phone]  06/12/2017 12:32 PM  This document serves as a record of services personally performed by Sullivan Lone, MD. It was created on his behalf by Baldwin Jamaica, a trained medical scribe. The creation of this record is based on the scribe's personal observations and the provider's statements to them.   .I have reviewed the above documentation for accuracy and completeness, and I agree with the above. Brunetta Genera MD [place]

## 2017-06-12 ENCOUNTER — Telehealth: Payer: Self-pay

## 2017-06-12 ENCOUNTER — Telehealth: Payer: Self-pay | Admitting: General Practice

## 2017-06-12 ENCOUNTER — Encounter: Payer: Self-pay | Admitting: Hematology

## 2017-06-12 ENCOUNTER — Inpatient Hospital Stay: Payer: BLUE CROSS/BLUE SHIELD | Attending: Hematology

## 2017-06-12 ENCOUNTER — Encounter: Payer: Self-pay | Admitting: General Practice

## 2017-06-12 ENCOUNTER — Inpatient Hospital Stay (HOSPITAL_BASED_OUTPATIENT_CLINIC_OR_DEPARTMENT_OTHER): Payer: BLUE CROSS/BLUE SHIELD | Admitting: Hematology

## 2017-06-12 VITALS — BP 133/46 | HR 58 | Temp 97.8°F | Resp 17 | Ht 63.0 in | Wt 127.5 lb

## 2017-06-12 DIAGNOSIS — Z87891 Personal history of nicotine dependence: Secondary | ICD-10-CM | POA: Insufficient documentation

## 2017-06-12 DIAGNOSIS — I10 Essential (primary) hypertension: Secondary | ICD-10-CM | POA: Insufficient documentation

## 2017-06-12 DIAGNOSIS — B370 Candidal stomatitis: Secondary | ICD-10-CM | POA: Diagnosis not present

## 2017-06-12 DIAGNOSIS — I824Z1 Acute embolism and thrombosis of unspecified deep veins of right distal lower extremity: Secondary | ICD-10-CM | POA: Diagnosis not present

## 2017-06-12 DIAGNOSIS — Z5111 Encounter for antineoplastic chemotherapy: Secondary | ICD-10-CM | POA: Insufficient documentation

## 2017-06-12 DIAGNOSIS — Z5189 Encounter for other specified aftercare: Secondary | ICD-10-CM | POA: Insufficient documentation

## 2017-06-12 DIAGNOSIS — M7918 Myalgia, other site: Secondary | ICD-10-CM | POA: Diagnosis not present

## 2017-06-12 DIAGNOSIS — C3490 Malignant neoplasm of unspecified part of unspecified bronchus or lung: Secondary | ICD-10-CM | POA: Diagnosis not present

## 2017-06-12 DIAGNOSIS — R197 Diarrhea, unspecified: Secondary | ICD-10-CM | POA: Diagnosis not present

## 2017-06-12 DIAGNOSIS — Z7189 Other specified counseling: Secondary | ICD-10-CM

## 2017-06-12 LAB — CBC WITH DIFFERENTIAL (CANCER CENTER ONLY)
Basophils Absolute: 0.1 10*3/uL (ref 0.0–0.1)
Basophils Relative: 1 %
Eosinophils Absolute: 0.1 10*3/uL (ref 0.0–0.5)
Eosinophils Relative: 2 %
HCT: 37.5 % (ref 34.8–46.6)
Hemoglobin: 12.1 g/dL (ref 11.6–15.9)
Lymphocytes Relative: 29 %
Lymphs Abs: 1.7 10*3/uL (ref 0.9–3.3)
MCH: 29.4 pg (ref 25.1–34.0)
MCHC: 32.3 g/dL (ref 31.5–36.0)
MCV: 91.2 fL (ref 79.5–101.0)
Monocytes Absolute: 0.5 10*3/uL (ref 0.1–0.9)
Monocytes Relative: 9 %
Neutro Abs: 3.4 10*3/uL (ref 1.5–6.5)
Neutrophils Relative %: 59 %
Platelet Count: 204 10*3/uL (ref 145–400)
RBC: 4.11 MIL/uL (ref 3.70–5.45)
RDW: 14.1 % (ref 11.2–14.5)
WBC Count: 5.8 10*3/uL (ref 3.9–10.3)

## 2017-06-12 LAB — CMP (CANCER CENTER ONLY)
ALT: 9 U/L (ref 0–55)
AST: 14 U/L (ref 5–34)
Albumin: 3.4 g/dL — ABNORMAL LOW (ref 3.5–5.0)
Alkaline Phosphatase: 80 U/L (ref 40–150)
Anion gap: 9 (ref 3–11)
BUN: 10 mg/dL (ref 7–26)
CO2: 26 mmol/L (ref 22–29)
Calcium: 10.1 mg/dL (ref 8.4–10.4)
Chloride: 105 mmol/L (ref 98–109)
Creatinine: 0.71 mg/dL (ref 0.60–1.10)
GFR, Est AFR Am: >60 mL/min (ref >60)
GFR, Estimated: >60 mL/min (ref >60)
Glucose, Bld: 88 mg/dL (ref 70–140)
Potassium: 3.8 mmol/L (ref 3.5–5.1)
Sodium: 140 mmol/L (ref 136–145)
Total Bilirubin: 0.4 mg/dL (ref 0.2–1.2)
Total Protein: 7 g/dL (ref 6.4–8.3)

## 2017-06-12 LAB — RETICULOCYTES
RBC.: 4.11 MIL/uL (ref 3.70–5.45)
Retic Count, Absolute: 28.8 10*3/uL — ABNORMAL LOW (ref 33.7–90.7)
Retic Ct Pct: 0.7 % (ref 0.7–2.1)

## 2017-06-12 LAB — IRON AND TIBC
Iron: 41 ug/dL (ref 41–142)
Saturation Ratios: 14 % — ABNORMAL LOW (ref 21–57)
TIBC: 290 ug/dL (ref 236–444)
UIBC: 250 ug/dL

## 2017-06-12 LAB — FERRITIN: Ferritin: 36 ng/mL (ref 9–269)

## 2017-06-12 NOTE — Telephone Encounter (Signed)
[facility] CSW Progress Note  Patient called, concerned she will lose insurance on 06/30/17; anxious about being able to begin treatment.  Has been told "someone will help me get disability."  CSW explained process of short term vs long term disability.  Encouraged patient to explore resources at her job.  Referred to [facility] for help w long term disability application.  Messaged [employer] requesting they reach out to patient re options for uninsured patients and possible Medicaid application.    Edwyna Shell, LCSW Clinical Social Worker Phone:  [phone]

## 2017-06-12 NOTE — Progress Notes (Signed)
[facility] CSW Progress Note  Product/process development scientist received from desk nurse, Aldona Bar.  Patient states that she needs help applying for disability - is currently out of work only until April 23.  Wants to begin treatment and is concerned about insurance benefits.  CSW was asked to reach out to patient at [phone].  No answer, no VM.  Spoke w Aldona Bar, conveyed information on process of applying for long term disability (either directly w IT trainer, w help from Engineer, building services and/or by referral to [employer]) or short term disability (patient would need to verify her coverage by contacting her employer).  CSW will pass on request to contact patient to [place] to assist if patient does not call today.  Edwyna Shell, LCSW Clinical Social Worker Phone:  [phone]

## 2017-06-12 NOTE — Telephone Encounter (Signed)
Patient and brother in clinic today to see Dr. Irene Limbo. Brought forward concern regarding Social Security Disability. Patient was recently hospitalized and unable to continue to work at this time. Her employer has given her three months of insurance. Per Edwyna Shell, CSW this is probably FMLA. Three months will end on April 23rd. Patient and brother very anxious as they expressed, "We have been getting the run-around and were promised to speak with the Social Worker while hospitalized and now twice at the [employer] office, but the Education officer, museum is never available." Explained that the social workers have a lot of patients and some scheduled appointments as well. Patient given card for Johnnye Lana and Edwyna Shell.   Discussed situation with Webb Silversmith on the phone and based on current situation, patient may not be eligible for long-term disability at this time. CSW called patient, but received no answer. Message forwarded to Abigail to f/u on Monday.

## 2017-06-16 ENCOUNTER — Encounter: Payer: Self-pay | Admitting: Hematology and Oncology

## 2017-06-16 NOTE — Progress Notes (Signed)
Called patient referred by Baron Hamper.  Asked patient if she was sure what date her insurance coverage would end this month and she was unsure. Advised her to contact her employer to get the actual termination date for her coverage. She can apply for Medicaid once her coverage terminates. If she applies now, it will still show active. Advised her that once she becomes uninsured, if she is receiving treatment drugs that can be provided through a free drug program, Rob in pharmacy will reach out to her regarding applying for these programs. This cannot be done until her coverage shows as terminated. She will also automatically receive the 55% uninsured discount once she becomes uninsured. Advised her to follow up with me on 06/18/17 to advise of confirmed termination date of coverage and I will give her a Medicaid application to be completed and returned to [place].

## 2017-06-17 ENCOUNTER — Encounter: Payer: Self-pay | Admitting: Hematology

## 2017-06-17 ENCOUNTER — Encounter: Payer: Self-pay | Admitting: Physician Assistant

## 2017-06-17 ENCOUNTER — Encounter: Payer: BLUE CROSS/BLUE SHIELD | Attending: Registered Nurse | Admitting: Physical Medicine & Rehabilitation

## 2017-06-17 ENCOUNTER — Ambulatory Visit (INDEPENDENT_AMBULATORY_CARE_PROVIDER_SITE_OTHER): Payer: BLUE CROSS/BLUE SHIELD | Admitting: Physician Assistant

## 2017-06-17 ENCOUNTER — Encounter: Payer: Self-pay | Admitting: Physical Medicine & Rehabilitation

## 2017-06-17 VITALS — BP 124/58 | HR 71 | Ht 63.0 in | Wt 129.0 lb

## 2017-06-17 VITALS — BP 128/72 | HR 63 | Ht 63.0 in | Wt 128.0 lb

## 2017-06-17 DIAGNOSIS — I10 Essential (primary) hypertension: Secondary | ICD-10-CM | POA: Insufficient documentation

## 2017-06-17 DIAGNOSIS — E559 Vitamin D deficiency, unspecified: Secondary | ICD-10-CM | POA: Insufficient documentation

## 2017-06-17 DIAGNOSIS — K922 Gastrointestinal hemorrhage, unspecified: Secondary | ICD-10-CM | POA: Diagnosis not present

## 2017-06-17 DIAGNOSIS — R569 Unspecified convulsions: Secondary | ICD-10-CM

## 2017-06-17 DIAGNOSIS — K921 Melena: Secondary | ICD-10-CM

## 2017-06-17 DIAGNOSIS — I824Z9 Acute embolism and thrombosis of unspecified deep veins of unspecified distal lower extremity: Secondary | ICD-10-CM

## 2017-06-17 DIAGNOSIS — E7800 Pure hypercholesterolemia, unspecified: Secondary | ICD-10-CM | POA: Insufficient documentation

## 2017-06-17 DIAGNOSIS — E785 Hyperlipidemia, unspecified: Secondary | ICD-10-CM

## 2017-06-17 DIAGNOSIS — F17200 Nicotine dependence, unspecified, uncomplicated: Secondary | ICD-10-CM | POA: Insufficient documentation

## 2017-06-17 DIAGNOSIS — Z9889 Other specified postprocedural states: Secondary | ICD-10-CM | POA: Insufficient documentation

## 2017-06-17 DIAGNOSIS — I480 Paroxysmal atrial fibrillation: Secondary | ICD-10-CM

## 2017-06-17 DIAGNOSIS — C3490 Malignant neoplasm of unspecified part of unspecified bronchus or lung: Secondary | ICD-10-CM

## 2017-06-17 DIAGNOSIS — I82491 Acute embolism and thrombosis of other specified deep vein of right lower extremity: Secondary | ICD-10-CM

## 2017-06-17 DIAGNOSIS — I481 Persistent atrial fibrillation: Secondary | ICD-10-CM | POA: Insufficient documentation

## 2017-06-17 DIAGNOSIS — G7281 Critical illness myopathy: Secondary | ICD-10-CM | POA: Insufficient documentation

## 2017-06-17 DIAGNOSIS — K259 Gastric ulcer, unspecified as acute or chronic, without hemorrhage or perforation: Secondary | ICD-10-CM | POA: Diagnosis present

## 2017-06-17 DIAGNOSIS — Z7689 Persons encountering health services in other specified circumstances: Secondary | ICD-10-CM | POA: Insufficient documentation

## 2017-06-17 NOTE — Progress Notes (Signed)
Cardiology Office Note    Date:  06/17/2017   ID:  [name], DOB 05/25/1955, MRN [id number]  PCP:  [name], MD  Cardiologist:  Dr. Stanford Breed   Chief Complaint  Patient presents with  . Follow-up    seen for Dr. Stanford Breed. PAF    History of Present Illness:  Kayla Wyatt is a 62 y.o. female with PMH of HTN and HLD who recently presented to the hospital on 04/02/2017 with lower abdominal pain, bloody diarrhea, hypotension and tachycardia. She underwent EGD on 04/03/2017 which showed gastric ulcer with visible vessels. GI bleed treated with clip. She was noted to have new afib with RVR on telemetry 04/06/2017. Echo 04/06/2017 showed LVEF 55-60%, grade 2 DD. She was going in and out of afib at the time cardiology saw the patient. She was placed on IV amiodarone to maintain sinus rhythm. She was considered not a candidate for anticoagulation at the time due to active bleeding. She was also diagnosed with gastrocnemius DVT, but no proximal DVT.  She was initially placed on Xarelto but had hemoptysis and is what was discontinued.  CTA of chest 04/07/2017 showed no PE, but a 4.1 cm soft tissue mass within the aortopulmonary window region of the mediastinum.  The mass was concerning for neoplasm.  Patient had a significant hypertension on 04/12/2017, arterial line and central line were placed.  Acute respiratory failure was felt to be related to hemorrhagic shock.  She required short-term intubation, and was successfully extubated on 04/15/2016.  She was noted to have hemoglobin down to 4, and required further transfusion.  She required a total of 6 units of packed red blood cell transfusion during hospitalization.  Upon discharge on 04/22/2817, her hemoglobin remained stable around 8.5-8.7.  IVC filter was placed as the patient cannot be anticoagulated short-term.  Patient was seen by [name] on 05/11/2017 in the office.  She eventually underwent video bronchoscopy with endobronchial ultrasound and  transbronchial biopsy confirming non-small cell cancer stage III.  Patient presents today for cardiology office visit.  Her recent CBC shows normalization of the hemoglobin level.  She has been evaluated by Dr. Joylene Draft of hematology oncology service.  She denies any recent chest pain, shortness of breath, lower extremity edema, orthopnea or PND.  She is no longer on any rate control medication or amiodarone by this point.  She does not seems to have any recurrence of palpitation.  Heart rate is 63.  I will hold off on initiating any rate control medication.  She will need a 30-day event monitor, if she does not have any recurrence of atrial fibrillation, then I would be in favor of not starting on any systemic anticoagulation therapy.  Note, patient likely will start on chemotherapy near the middle of this month.  Otherwise she has no lower extremity edema, orthopnea or PND.   Past Medical History:  Diagnosis Date  . Acute blood loss anemia 04/02/2017  . Anxiety   . Gastrointestinal hemorrhage with melena   . Headache   . Hypercholesteremia   . Hypertension   . Persistent atrial fibrillation with rapid ventricular response ([place]) 04/07/2017  . Seizures ([place])    04/2017 while in hospital at [facility]  . Vitamin D deficiency     Past Surgical History:  Procedure Laterality Date  . COLONOSCOPY Left 04/09/2017   Procedure: COLONOSCOPY;  Surgeon: Carol Ada, MD;  Location: [place];  Service: Endoscopy;  Laterality: Left;  . ESOPHAGOGASTRODUODENOSCOPY N/A 04/03/2017   Procedure: ESOPHAGOGASTRODUODENOSCOPY (EGD);  Surgeon: Carol Ada, MD;  Location: [facility];  Service: Endoscopy;  Laterality: N/A;  . ESOPHAGOGASTRODUODENOSCOPY (EGD) WITH PROPOFOL N/A 04/12/2017   Procedure: ESOPHAGOGASTRODUODENOSCOPY (EGD) WITH PROPOFOL;  Surgeon: Milus Banister, MD;  Location: [facility] ENDOSCOPY;  Service: Endoscopy;  Laterality: N/A;  . IR ANGIOGRAM SELECTIVE EACH ADDITIONAL VESSEL  04/12/2017  . IR ANGIOGRAM  SELECTIVE EACH ADDITIONAL VESSEL  04/13/2017  . IR ANGIOGRAM SELECTIVE EACH ADDITIONAL VESSEL  04/13/2017  . IR ANGIOGRAM SELECTIVE EACH ADDITIONAL VESSEL  04/13/2017  . IR ANGIOGRAM VISCERAL SELECTIVE  04/12/2017  . IR ANGIOGRAM VISCERAL SELECTIVE  04/13/2017  . IR EMBO ART  VEN HEMORR LYMPH EXTRAV  INC GUIDE ROADMAPPING  04/12/2017  . IR EMBO ART  VEN HEMORR LYMPH EXTRAV  INC GUIDE ROADMAPPING  04/13/2017  . IR FLUORO GUIDE CV LINE RIGHT  04/13/2017  . IR IVC FILTER PLMT / S&I /IMG GUID/MOD SED  04/12/2017  . IR US GUIDE VASC ACCESS RIGHT  04/12/2017  . IR US GUIDE VASC ACCESS RIGHT  04/13/2017  . IR US GUIDE VASC ACCESS RIGHT  04/13/2017  . LEG SURGERY  1974   Blood Clot Removal   . VIDEO BRONCHOSCOPY WITH ENDOBRONCHIAL ULTRASOUND N/A 05/19/2017   Procedure: VIDEO BRONCHOSCOPY WITH ENDOBRONCHIAL ULTRASOUND;  Surgeon: Grace Isaac, MD;  Location: [facility] OR;  Service: Thoracic;  Laterality: N/A;    Current Medications: Outpatient Medications Prior to Visit  Medication Sig Dispense Refill  . acetaminophen (TYLENOL) 325 MG tablet Take 1-2 tablets (325-650 mg total) by mouth every 4 (four) hours as needed for mild pain.    . budesonide-formoterol (SYMBICORT) 80-4.5 MCG/ACT inhaler Inhale 2 puffs into the lungs 2 (two) times daily. 1 Inhaler 12  . cholecalciferol (VITAMIN D) 1000 units tablet Take 1,000 Units by mouth 2 (two) times daily.    . collagenase (SANTYL) ointment Apply topically daily. To sacral area with damp to dry dressing. Change daily 15 g 0  . levETIRAcetam (KEPPRA) 500 MG tablet TK 1 T PO BID  6  . magnesium oxide (MAG-OX) 400 (241.3 Mg) MG tablet Take 0.5 tablets (200 mg total) by mouth 2 (two) times daily. 60 tablet 0  . pantoprazole (PROTONIX) 40 MG tablet TK 1 T PO BID  6  . polycarbophil (FIBERCON) 625 MG tablet Take 2 tablets (1,250 mg total) by mouth 2 (two) times daily. 120 tablet 0  . potassium chloride SA (K-DUR,KLOR-CON) 20 MEQ tablet Take 1 tablet (20 mEq total) by mouth 2 (two) times  daily. 60 tablet 0  . saccharomyces boulardii (FLORASTOR) 250 MG capsule Take 1 capsule (250 mg total) by mouth 2 (two) times daily. 100 capsule 0   No facility-administered medications prior to visit.      Allergies:   Patient has no known allergies.   Social History   Socioeconomic History  . Marital status: Single    Spouse name: Not on file  . Number of children: Not on file  . Years of education: Not on file  . Highest education level: Not on file  Occupational History  . Occupation: Has to lift heavy boxes at times.    Employer: [employer]  Social Needs  . Financial resource strain: Not on file  . Food insecurity:    Worry: Not on file    Inability: Not on file  . Transportation needs:    Medical: Not on file    Non-medical: Not on file  Tobacco Use  . Smoking status: Former Smoker  Packs/day: 0.50    Years: 40.00    Pack years: 20.00    Types: Cigarettes    Last attempt to quit: 03/31/2017    Years since quitting: 0.2  . Smokeless tobacco: Never Used  Substance and Sexual Activity  . Alcohol use: No    Frequency: Never  . Drug use: No  . Sexual activity: Not Currently  Lifestyle  . Physical activity:    Days per week: Not on file    Minutes per session: Not on file  . Stress: Not on file  Relationships  . Social connections:    Talks on phone: Not on file    Gets together: Not on file    Attends religious service: Not on file    Active member of club or organization: Not on file    Attends meetings of clubs or organizations: Not on file    Relationship status: Not on file  Other Topics Concern  . Not on file  Social History Narrative  . Not on file     Family History:  The patient's family history includes Heart disease in her father.   ROS:   Please see the history of present illness.    ROS All other systems reviewed and are negative.   PHYSICAL EXAM:   VS:  BP 128/72   Pulse 63   Ht 5\' 3"  (1.6 m)   Wt 128 lb (58.1 kg)   BMI 22.67 kg/m     GEN: Well nourished, well developed, in no acute distress  HEENT: normal  Neck: no JVD, carotid bruits, or masses Cardiac: RRR; no murmurs, rubs, or gallops,no edema  Respiratory:  clear to auscultation bilaterally, normal work of breathing GI: soft, nontender, nondistended, + BS MS: no deformity or atrophy  Skin: warm and dry, no rash Neuro:  Alert and Oriented x 3, Strength and sensation are intact Psych: euthymic mood, full affect  Wt Readings from Last 3 Encounters:  06/17/17 128 lb (58.1 kg)  06/17/17 129 lb (58.5 kg)  06/12/17 127 lb 8 oz (57.8 kg)      Studies/Labs Reviewed:   EKG:  EKG is ordered today.  The ekg ordered today demonstrates NSR without significant ST-T wave changes  Recent Labs: 04/08/2017: TSH 2.440 04/30/2017: Magnesium 2.0 05/19/2017: Hemoglobin 12.6 06/12/2017: ALT 9; BUN 10; Creatinine 0.71; Platelet Count 204; Potassium 3.8; Sodium 140   Lipid Panel No results found for: CHOL, TRIG, HDL, CHOLHDL, VLDL, LDLCALC, LDLDIRECT  Additional studies/ records that were reviewed today include:   CTA of chest 04/07/2017 IMPRESSION: 1. 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 Recommend tissue sampling. 2. No pulmonary embolism seen, with mild study limitations detailed above. 3. Small right pleural effusion.  Mild bibasilar atelectasis. 4. Mild emphysematous changes within the lung apices.     ASSESSMENT:    1. Paroxysmal A-fib ([place])   2. Non-small cell lung cancer, unspecified laterality ([place])   3. Essential hypertension   4. Hyperlipidemia, unspecified hyperlipidemia type   5. Gastrointestinal hemorrhage, unspecified gastrointestinal hemorrhage type   6. Deep vein thrombosis (DVT) of distal vein of lower extremity, unspecified chronicity, unspecified laterality (H[facility])      PLAN:  In order of problems listed above:  1. Paroxysmal atrial fibrillation: Her  paroxysmal atrial fibrillation occurred in the setting of acute GI bleed, hypotension and upper EGD procedure.  Given the recent GI bleed, I would not recommend systemic anticoagulation at this time.  Given the transient nature of her atrial fibrillation and associated potential trigger for her atrial fibrillation, I would recommend a 30-day monitor at this time.  If she does not have any recurrence of atrial fibrillation on the heart monitor, I would recommend to off on initiating systemic anticoagulation therapy.  She is now off of all rate control medication given the recent severe hypotension in the setting of severe anemia.  I did not restart her on low-dose metoprolol today.  We can potentially consider starting her on low-dose beta-blocker later if she remains stable.   2. Non-small cell lung cancer stage III: Confirmed through biopsy by [name], she is being followed by Dr. Joylene Draft of hematology and oncology and has upcoming chemotherapy.  3. Hypertension: Pressure is normal, she had severe hemorrhagic shock and hypotension in January requiring intubation and prolonged ICU admission.  Since then she has recovered quite well.  Despite the fact that she carries a diagnosis of hypertension, she is actually not on any blood pressure medication.  May consider addition of low-dose beta-blocker on follow-up to further suppress irregular rhythm.  4. Hyperlipidemia: She is not on any statin medication.  Will defer annual lipid panel to primary care provider.  5. GI bleed: Underwent EGD in January with clipping of gastric ulcer.  Recent hemoglobin is essentially normalized.  6. Lower extremity DVT: Occurred in the distal vein, underwent IVC filter placement given the inability to start on anticoagulation.  She was briefly placed on Xarelto during the January admission, however had a hemoptysis and severe anemia afterward.    Medication Adjustments/Labs and Tests Ordered: Current medicines are reviewed  at length with the patient today.  Concerns regarding medicines are outlined above.  Medication changes, Labs and Tests ordered today are listed in the Patient Instructions below. Patient Instructions  Medication Instructions:  Your physician recommends that you continue on your current medications as directed. Please refer to the Current Medication list given to you today.  Labwork: NONE   Testing/Procedures: Your physician has recommended that you wear an 30 DAY event monitor. Event monitors are medical devices that record the heart's electrical activity. Doctors most often [place] these monitors to diagnose arrhythmias. Arrhythmias are problems with the speed or rhythm of the heartbeat. The monitor is a small, portable device. You can wear one while you do your normal daily activities. This is usually used to diagnose what is causing palpitations/syncope (passing out). [place]: Your physician recommends that you schedule a follow-up appointment in: 3 MONTHS WITH DR Stanford Breed  Any Other Special Instructions Will Be Listed Below (If Applicable).  If you need a refill on your cardiac medications before your next appointment, please call your pharmacy.     Hilbert Corrigan, [place]  06/17/2017 11:28 PM    [facility] [address], [place], [place]  [address] Phone: [phone]; Fax: [phone]

## 2017-06-17 NOTE — Progress Notes (Signed)
Returned patient's call from a voicemail she left yesterday afternoon.  She states she has it taken care of. Asked patient if she found out from her employer what her termination date would be for her insurance. She said the 30th but her HR will send her information regarding COBRA and she is thinking about keeping coverage with them. Advised her I will provide her with my card tomorrow if she has any addtional financial questions or concerns.

## 2017-06-17 NOTE — Patient Instructions (Signed)
Medication Instructions:  Your physician recommends that you continue on your current medications as directed. Please refer to the Current Medication list given to you today.  Labwork: NONE   Testing/Procedures: Your physician has recommended that you wear an 30 DAY event monitor. Event monitors are medical devices that record the heart's electrical activity. Doctors most often [place] these monitors to diagnose arrhythmias. Arrhythmias are problems with the speed or rhythm of the heartbeat. The monitor is a small, portable device. You can wear one while you do your normal daily activities. This is usually used to diagnose what is causing palpitations/syncope (passing out). [place]: Your physician recommends that you schedule a follow-up appointment in: 3 MONTHS WITH DR Stanford Breed  Any Other Special Instructions Will Be Listed Below (If Applicable).  If you need a refill on your cardiac medications before your next appointment, please call your pharmacy.

## 2017-06-17 NOTE — Progress Notes (Signed)
Subjective:    Patient ID: Kayla Wyatt, female    DOB: Apr 22, 1955, 62 y.o.   MRN: [id number]  HPI 63 y.o. female with history of HTN presents for hospital follow up after receiving CIR for PRES.     Admit date: 04/22/2017 Discharge date: 04/30/2017  Since discharge, she saw Pulm and Heme/Onc and was diagnosed with Non-SCC stage III, notes reviewed. At discharge, she was instructed to follow up with Cards, who she sees today. She saw CTS. She has to reschedule to see GI. She saw her PCP. She had her labs drawn. Her sacral ulcer is improving. She her MRI brain scheduled for May 2nd. No difficulties with breathing. Denies pain. Denies falls.  Therapies: Patient declined DME: Previously possessed Mobility: Walker at night  Pain Inventory Average Pain 0 Pain Right Now 0 My pain is na  In the last 24 hours, has pain interfered with the following? General activity 0 Relation with others 0 Enjoyment of life 0 What TIME of day is your pain at its worst? na Sleep (in general) Good  Pain is worse with: nsa Pain improves with: na Relief from Meds: na  Mobility walk without assistance ability to climb steps?  yes do you drive?  no  Function employed # of hrs/week [employer]ml[employer]  Neuro/Psych numbness tingling  Prior Studies Any changes since last visit?  no  Physicians involved in your care Any changes since last visit?  no   Family History  Problem Relation Age of Onset  . Heart disease Father    Social History   Socioeconomic History  . Marital status: Single    Spouse name: Not on file  . Number of children: Not on file  . Years of education: Not on file  . Highest education level: Not on file  Occupational History  . Occupation: Has to lift heavy boxes at times.    Employer: [employer]  Social Needs  . Financial resource strain: Not on file  . Food insecurity:    Worry: Not on file    Inability: Not on file  . Transportation needs:    Medical: Not on file   Non-medical: Not on file  Tobacco Use  . Smoking status: Former Smoker    Packs/day: 0.50    Years: 40.00    Pack years: 20.00    Types: Cigarettes    Last attempt to quit: 03/31/2017    Years since quitting: 0.2  . Smokeless tobacco: Never Used  Substance and Sexual Activity  . Alcohol use: No    Frequency: Never  . Drug use: No  . Sexual activity: Not Currently  Lifestyle  . Physical activity:    Days per week: Not on file    Minutes per session: Not on file  . Stress: Not on file  Relationships  . Social connections:    Talks on phone: Not on file    Gets together: Not on file    Attends religious service: Not on file    Active member of club or organization: Not on file    Attends meetings of clubs or organizations: Not on file    Relationship status: Not on file  Other Topics Concern  . Not on file  Social History Narrative  . Not on file   Past Surgical History:  Procedure Laterality Date  . COLONOSCOPY Left 04/09/2017   Procedure: COLONOSCOPY;  Surgeon: Carol Ada, MD;  Location: [facility];  Service: Endoscopy;  Laterality: Left;  . ESOPHAGOGASTRODUODENOSCOPY N/A  04/03/2017   Procedure: ESOPHAGOGASTRODUODENOSCOPY (EGD);  Surgeon: Carol Ada, MD;  Location: [facility];  Service: Endoscopy;  Laterality: N/A;  . ESOPHAGOGASTRODUODENOSCOPY (EGD) WITH PROPOFOL N/A 04/12/2017   Procedure: ESOPHAGOGASTRODUODENOSCOPY (EGD) WITH PROPOFOL;  Surgeon: Milus Banister, MD;  Location: [facility] ENDOSCOPY;  Service: Endoscopy;  Laterality: N/A;  . IR ANGIOGRAM SELECTIVE EACH ADDITIONAL VESSEL  04/12/2017  . IR ANGIOGRAM SELECTIVE EACH ADDITIONAL VESSEL  04/13/2017  . IR ANGIOGRAM SELECTIVE EACH ADDITIONAL VESSEL  04/13/2017  . IR ANGIOGRAM SELECTIVE EACH ADDITIONAL VESSEL  04/13/2017  . IR ANGIOGRAM VISCERAL SELECTIVE  04/12/2017  . IR ANGIOGRAM VISCERAL SELECTIVE  04/13/2017  . IR EMBO ART  VEN HEMORR LYMPH EXTRAV  INC GUIDE ROADMAPPING  04/12/2017  . IR EMBO ART  VEN HEMORR LYMPH EXTRAV   INC GUIDE ROADMAPPING  04/13/2017  . IR FLUORO GUIDE CV LINE RIGHT  04/13/2017  . IR IVC FILTER PLMT / S&I /IMG GUID/MOD SED  04/12/2017  . IR US GUIDE VASC ACCESS RIGHT  04/12/2017  . IR US GUIDE VASC ACCESS RIGHT  04/13/2017  . IR US GUIDE VASC ACCESS RIGHT  04/13/2017  . LEG SURGERY  1974   Blood Clot Removal   . VIDEO BRONCHOSCOPY WITH ENDOBRONCHIAL ULTRASOUND N/A 05/19/2017   Procedure: VIDEO BRONCHOSCOPY WITH ENDOBRONCHIAL ULTRASOUND;  Surgeon: Grace Isaac, MD;  Location: [place];  Service: Thoracic;  Laterality: N/A;   Past Medical History:  Diagnosis Date  . Acute blood loss anemia 04/02/2017  . Anxiety   . Gastrointestinal hemorrhage with melena   . Headache   . Hypercholesteremia   . Hypertension   . Persistent atrial fibrillation with rapid ventricular response ([place]) 04/07/2017  . Seizures ([place])    04/2017 while in hospital at [facility]  . Vitamin D deficiency    BP (!) 124/58   Pulse 71   Ht 5\' 3"  (1.6 m) Comment: states  Wt 129 lb (58.5 kg)   SpO2 95%   BMI 22.85 kg/m   Opioid Risk Score:   Fall Risk Score:  `1  Depression screen PHQ 2/9  Depression screen [facility] 2/9 06/09/2017 05/11/2017 05/11/2017  Decreased Interest 0 0 0  Down, Depressed, Hopeless 0 0 0  PHQ - 2 Score 0 0 0  Altered sleeping - 0 -  Tired, decreased energy - 1 -  Change in appetite - 0 -  Feeling bad or failure about yourself  - 0 -  Trouble concentrating - 0 -  Moving slowly or fidgety/restless - 0 -  Suicidal thoughts - 0 -  PHQ-9 Score - 1 -  Difficult doing work/chores - Not difficult at all -     Review of Systems  Constitutional: Negative.   HENT: Negative.   Eyes: Negative.   Respiratory: Negative.   Cardiovascular: Negative.   Gastrointestinal: Negative.   Endocrine: Negative.   Genitourinary: Negative.   Musculoskeletal: Negative.   Skin: Negative.   Allergic/Immunologic: Negative.   Neurological: Positive for numbness.  Hematological: Negative.   Psychiatric/Behavioral: Negative.     All other systems reviewed and are negative.     Objective:   Physical Exam Constitutional: She appears well-developed. No distress.  HENT: Normocephalic and atraumatic.  Eyes: EOM are normal. No discharge.  Cardiovascular: Irregularly irregular. No JVD. Respiratory: Clear. Unlabored. GI: BS +, non-tender  Musculoskeletal: She exhibits no edema or tenderness.  Neurological: She is alert and oriented.  HOH Motor: 4+-5/5 bilateral upper extremities 4+/5 bilateral hip flexion, 4+-5/5 knee extension, ankle dorsi/plantar flexion  Skin: Skin is warm and dry. Sacral ulcer healed. Psychiatric: Her speech is normal and behavior is normal.     Assessment & Plan:  62 y.o. female with history of HTN presents for hospital follow up after receiving CIR for PRES.    1. Deficits with mobility, endurance, self-care secondary to PRES  PRES resolved  Cont HEP  2.  RLE DVT:              Treated with IVC filter.   No anticoagulation-- due to significant bleed on Xarelto.   3. Skin/Wound Care:    Sacral ulcer healed  4. Gastric ulcer/GIB with hemorrhagic shock/ABLA:    Follow up with GI, needs appointment  5. Non-small cell CA, stage III  Follow up with Heme/Onc, plan for chemo  6. PRES with new onset seizures:   Continue Keppra bid  Follow up brain MRI  Meds reviewed Referrals reviewed - needs GI appointment All questions answered

## 2017-06-18 ENCOUNTER — Inpatient Hospital Stay: Payer: BLUE CROSS/BLUE SHIELD | Admitting: Hematology and Oncology

## 2017-06-18 ENCOUNTER — Inpatient Hospital Stay: Payer: BLUE CROSS/BLUE SHIELD

## 2017-06-18 ENCOUNTER — Encounter: Payer: Self-pay | Admitting: Hematology

## 2017-06-18 NOTE — Progress Notes (Signed)
Met with patient and brother to discuss their financial concerns. Patient's brother states her company is having a meeting to determine if she can continue coverage through them for an extension. Advised them for right now while she is showing covered there are no foundations that have copay assistance for her diagnosis but there is a copay assistance program for Ellen Henri that would leave a $0 copay after insurance has pays. He states she does not qualify for Medicaid due to him receiving social security and living with her. He wants to know what will happen after insurance ends. Advised him all uninsured patients automatically receive a 55% discount and if they apply for Medicaid, they can apply for additional assistance through [facility]. Mentioned the Access One program and provided the billing number to apply for that program through this program.  Enrolled patient in [place] Complete program for [employer]den[employer]. Patient approved.  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 Based on patient with no income, brother provided letter of support. Patient approved for one-time $400 grant. Provided a copy of the approval as well as the expense sheet it covers and the Outpatient pharmacy information. Gave my card for any additional financial questions or concerns.  Rob came down to meet with patient to discuss drug replacement for Uden[employer] if and when patient becomes uninsured. He left his card as well.          Advised them to contact me for any additional financial questions, concerns, or updates.

## 2017-06-21 DIAGNOSIS — Z7189 Other specified counseling: Secondary | ICD-10-CM | POA: Insufficient documentation

## 2017-06-21 MED ORDER — DEXAMETHASONE 4 MG PO TABS: 8.0000 mg | ORAL_TABLET | Freq: Every day | ORAL | 1 refills | Status: DC

## 2017-06-21 MED ORDER — ONDANSETRON HCL 8 MG PO TABS: 8.0000 mg | ORAL_TABLET | Freq: Two times a day (BID) | ORAL | 1 refills | Status: DC | PRN

## 2017-06-21 MED ORDER — PROCHLORPERAZINE MALEATE 10 MG PO TABS: 10.0000 mg | ORAL_TABLET | Freq: Four times a day (QID) | ORAL | 1 refills | Status: DC | PRN

## 2017-06-22 ENCOUNTER — Other Ambulatory Visit: Payer: Self-pay | Admitting: *Deleted

## 2017-06-22 DIAGNOSIS — C3490 Malignant neoplasm of unspecified part of unspecified bronchus or lung: Secondary | ICD-10-CM

## 2017-06-23 ENCOUNTER — Other Ambulatory Visit: Payer: Self-pay

## 2017-06-23 ENCOUNTER — Ambulatory Visit: Payer: BLUE CROSS/BLUE SHIELD | Admitting: Hematology and Oncology

## 2017-06-23 ENCOUNTER — Telehealth: Payer: Self-pay | Admitting: Hematology and Oncology

## 2017-06-23 ENCOUNTER — Inpatient Hospital Stay: Payer: BLUE CROSS/BLUE SHIELD

## 2017-06-23 ENCOUNTER — Encounter: Payer: Self-pay | Admitting: Hematology and Oncology

## 2017-06-23 ENCOUNTER — Inpatient Hospital Stay (HOSPITAL_BASED_OUTPATIENT_CLINIC_OR_DEPARTMENT_OTHER): Payer: BLUE CROSS/BLUE SHIELD | Admitting: Hematology and Oncology

## 2017-06-23 VITALS — BP 164/48 | HR 45 | Temp 97.8°F | Resp 17

## 2017-06-23 VITALS — BP 150/50 | HR 57 | Temp 97.6°F | Resp 16 | Ht 63.0 in | Wt 127.9 lb

## 2017-06-23 DIAGNOSIS — I10 Essential (primary) hypertension: Secondary | ICD-10-CM | POA: Diagnosis not present

## 2017-06-23 DIAGNOSIS — C3490 Malignant neoplasm of unspecified part of unspecified bronchus or lung: Secondary | ICD-10-CM

## 2017-06-23 DIAGNOSIS — Z7189 Other specified counseling: Secondary | ICD-10-CM

## 2017-06-23 DIAGNOSIS — Z87891 Personal history of nicotine dependence: Secondary | ICD-10-CM

## 2017-06-23 DIAGNOSIS — E876 Hypokalemia: Secondary | ICD-10-CM | POA: Diagnosis not present

## 2017-06-23 DIAGNOSIS — Z5111 Encounter for antineoplastic chemotherapy: Secondary | ICD-10-CM

## 2017-06-23 LAB — CBC WITH DIFFERENTIAL (CANCER CENTER ONLY)
Basophils Absolute: 0 K/uL (ref 0.0–0.1)
Basophils Relative: 1 %
Eosinophils Absolute: 0.1 K/uL (ref 0.0–0.5)
Eosinophils Relative: 1 %
HCT: 37.4 % (ref 34.8–46.6)
Hemoglobin: 12.1 g/dL (ref 11.6–15.9)
Lymphocytes Relative: 26 %
Lymphs Abs: 1.5 K/uL (ref 0.9–3.3)
MCH: 28.5 pg (ref 25.1–34.0)
MCHC: 32.2 g/dL (ref 31.5–36.0)
MCV: 88.6 fL (ref 79.5–101.0)
Monocytes Absolute: 0.5 K/uL (ref 0.1–0.9)
Monocytes Relative: 9 %
Neutro Abs: 3.7 K/uL (ref 1.5–6.5)
Neutrophils Relative %: 63 %
Platelet Count: 200 K/uL (ref 145–400)
RBC: 4.23 MIL/uL (ref 3.70–5.45)
RDW: 14.5 % (ref 11.2–14.5)
WBC Count: 5.8 K/uL (ref 3.9–10.3)

## 2017-06-23 LAB — CMP (CANCER CENTER ONLY)
ALT: 8 U/L (ref 0–55)
AST: 13 U/L (ref 5–34)
Albumin: 3.3 g/dL — ABNORMAL LOW (ref 3.5–5.0)
Alkaline Phosphatase: 79 U/L (ref 40–150)
Anion gap: 9 (ref 3–11)
BUN: 11 mg/dL (ref 7–26)
CO2: 24 mmol/L (ref 22–29)
Calcium: 9.9 mg/dL (ref 8.4–10.4)
Chloride: 109 mmol/L (ref 98–109)
Creatinine: 0.71 mg/dL (ref 0.60–1.10)
GFR, Est AFR Am: >60 mL/min (ref >60)
GFR, Estimated: >60 mL/min (ref >60)
Glucose, Bld: 87 mg/dL (ref 70–140)
Potassium: 3.4 mmol/L — ABNORMAL LOW (ref 3.5–5.1)
Sodium: 142 mmol/L (ref 136–145)
Total Bilirubin: 0.2 mg/dL (ref 0.2–1.2)
Total Protein: 7.1 g/dL (ref 6.4–8.3)

## 2017-06-23 MED ORDER — CARBOPLATIN CHEMO INJECTION 600 MG/60ML
449.0000 mg | Freq: Once | INTRAVENOUS | Status: AC
  Administered 2017-06-23: 450 mg via INTRAVENOUS
  Filled 2017-06-23: qty 45

## 2017-06-23 MED ORDER — PALONOSETRON HCL INJECTION 0.25 MG/5ML
0.2500 mg | Freq: Once | INTRAVENOUS | Status: AC
  Administered 2017-06-23: 0.25 mg via INTRAVENOUS

## 2017-06-23 MED ORDER — POTASSIUM CHLORIDE CRYS ER 20 MEQ PO TBCR
EXTENDED_RELEASE_TABLET | ORAL | Status: AC
  Filled 2017-06-23: qty 2

## 2017-06-23 MED ORDER — DIPHENHYDRAMINE HCL 50 MG/ML IJ SOLN
INTRAMUSCULAR | Status: AC
  Filled 2017-06-23: qty 1

## 2017-06-23 MED ORDER — FAMOTIDINE IN NACL 20-0.9 MG/50ML-% IV SOLN
INTRAVENOUS | Status: AC
  Filled 2017-06-23: qty 50

## 2017-06-23 MED ORDER — SODIUM CHLORIDE 0.9 % IV SOLN
Freq: Once | INTRAVENOUS | Status: AC
  Administered 2017-06-23: 11:00:00 via INTRAVENOUS
  Filled 2017-06-23: qty 5

## 2017-06-23 MED ORDER — DIPHENHYDRAMINE HCL 50 MG/ML IJ SOLN
50.0000 mg | Freq: Once | INTRAMUSCULAR | Status: AC
  Administered 2017-06-23: 50 mg via INTRAVENOUS

## 2017-06-23 MED ORDER — SODIUM CHLORIDE 0.9 % IV SOLN
175.0000 mg/m2 | Freq: Once | INTRAVENOUS | Status: AC
  Administered 2017-06-23: 282 mg via INTRAVENOUS
  Filled 2017-06-23: qty 47

## 2017-06-23 MED ORDER — POTASSIUM CHLORIDE CRYS ER 20 MEQ PO TBCR
40.0000 meq | EXTENDED_RELEASE_TABLET | Freq: Once | ORAL | Status: AC
  Administered 2017-06-23: 40 meq via ORAL

## 2017-06-23 MED ORDER — SODIUM CHLORIDE 0.9 % IV SOLN
Freq: Once | INTRAVENOUS | Status: AC
  Administered 2017-06-23: 10:00:00 via INTRAVENOUS

## 2017-06-23 MED ORDER — FAMOTIDINE IN NACL 20-0.9 MG/50ML-% IV SOLN
20.0000 mg | Freq: Once | INTRAVENOUS | Status: AC
  Administered 2017-06-23: 20 mg via INTRAVENOUS

## 2017-06-23 MED ORDER — PALONOSETRON HCL INJECTION 0.25 MG/5ML
INTRAVENOUS | Status: AC
  Filled 2017-06-23: qty 5

## 2017-06-23 MED ORDER — POTASSIUM CHLORIDE 10 MEQ/100ML IV SOLN
10.0000 meq | INTRAVENOUS | Status: DC
  Filled 2017-06-23: qty 100

## 2017-06-23 NOTE — Progress Notes (Signed)
Nutrition Assessment   Reason for Assessment:   Patient identified on Malnutrition Screening report for weight loss  ASSESSMENT:  62 year old female with new diagnosis of non-small cell lung cancer.  Past medical history of HTN, HLD, afib, Vit D deficency.  Noted hospital admission (30 days per patient) 1/24 with GI bleed, afib, intubated during admission.  Patient reports mostly NPO or on liquid diet during admission.    Met with patient and husband during first infusion today.  Patient reports appetite is good and she has been eating.  Reports that she is drinking 1 ensure enlive daily.  Reports that she usually eats grits and bacon or sausage and eggs or cereal for breakfast. Reports sometimes skips lunch or eats a bag of chips. Also may eat sandwich.  Dinner she usually cooks, meats (chicken, meatloaf, ham) potato, lima beans and corn, green beans, broccoli.    Nutrition Focused Physical Exam: deferred   Medications: magox, vit d, protonix, fibercon, KCL  Labs: K 3.4  Anthropometrics:   Height: 63 inches Weight: 127 lb 14.4 oz today UBW: 140-150s prior to hospital admission BMI: 22  Patient reports weight loss came from hospital admission.  Reports weight of 127-129 lb since discharged from the hospital 9% weight loss in 2 months, significant  Estimated Energy Needs  Kcals: 1450-1700 calories/d Protein: 72-85 g  Fluid: 2 L/d  NUTRITION DIAGNOSIS: Inadequate oral intake related to hospital admission (GI bleed) as evidenced by 9% weight loss in 2 months, signficant and eating < 50% of energy needs for > or equal to 5 days (during admission)   MALNUTRITION DIAGNOSIS: Patient meets criteria for severe malnutrition in acute illness related to hospital admission as evidenced by 9% weight loss in 2 months and eating < or equal to 50% of energy needs for > or equal to 5 days.  INTERVENTION:   Discussed importance of good nutrition during treatment and post recent  hospitalization.   Discussed strategies to increase calories and protein.  Fact sheet provided. Encouraged patient to increase ensure enlive to 2 per day for additional nutrition. Coupons given.    MONITORING, EVALUATION, GOAL: weight trends, intake   NEXT VISIT: May 7 during infusion  Blaire Palomino B. Zenia Resides, [place], [place] Registered Dietitian [phone] (pager)

## 2017-06-23 NOTE — Progress Notes (Signed)
[facility] Follow-up Visit:  Assessment: Non-small cell lung cancer ([place]) 62 y.o. female with at least stage III non-small cell carcinoma of the lung plan for initiation of systemic chemotherapy with carboplatin and paclitaxel by Dr. Irene Limbo.   Patient appears to be reasonably well informed regarding the diagnosis and plan therapy.  As this is her first treatment, I have reviewed the diagnosis, proposed treatment sequence, component medications, and schedule of administration with the patient.  I also have briefly reviewed most prominent potential side effects including neutropenic fever, nausea, vomiting and others with the patient.  She confirms her continued agreement to proceed with the recommended therapy.  Informed consent obtained.  Clinical evaluation lab work today permissive to proceed with recommended therapy.  Patient does have low potassium level, but already has potassium supplement prescribed which she has not been taking yet.  Plan: -Proceed with potassium replacement of 20 mEq twice daily -Proceed with initial cycle of carboplatin and paclitaxel systemic therapy. -Return to clinic in 1 week with Dr Irene Limbo for toxicity assessment.   Voice recognition software was used and creation of this note. Despite my best effort at editing the text, some misspelling/errors may have occurred.  Orders Placed This Encounter  Procedures  . CBC with Differential (Cancer Center Only)    Standing Status:   Future    Standing Expiration Date:   06/24/2018  . CMP ([facility] only)    Standing Status:   Future    Standing Expiration Date:   06/24/2018  . Magnesium    Standing Status:   Future    Standing Expiration Date:   06/23/2018    Cancer Staging Non-small cell lung cancer ([place]) Staging form: Lung, AJCC 8th Edition - Clinical: No stage assigned - Unsigned   All questions were answered.  . The patient knows to call the clinic with any problems, questions or  concerns.  This note was electronically signed.    History of Presenting Illness Kayla Wyatt 62 y.o. presenting to the [facility] for initiation of induction systemic chemotherapy with carboplatin and paclitaxel for diagnosis of at least stage III non-small cell lung cancer.  Her primary oncologist is Dr. Irene Limbo and I am seeing the patient in his absence.  Patient underwent chemotherapy education.  She does not have any significant questions regarding her planned therapy.  Patient denies any active fever, chills, night sweats.  Stable respiratory symptoms.  No new neurological symptoms.  Oncological/hematological History:   Non-small cell lung cancer ([place])   06/05/2017 Initial Diagnosis    Non-small cell lung cancer ([place])      06/23/2017 -  Chemotherapy    Neo-adjuvant chemotherapy: CARBOplatin (PARAPLATIN) AUC5, d1 + PACLitaxel 175 mg/m2, d1 Q21d with G-CSF support --Cycle #1, 06/23/17:       Medical History: Past Medical History:  Diagnosis Date  . Acute blood loss anemia 04/02/2017  . Anxiety   . Gastrointestinal hemorrhage with melena   . Headache   . Hypercholesteremia   . Hypertension   . Persistent atrial fibrillation with rapid ventricular response ([place]) 04/07/2017  . Seizures ([place])    04/2017 while in hospital at [facility]  . Vitamin D deficiency     Surgical History: Past Surgical History:  Procedure Laterality Date  . COLONOSCOPY Left 04/09/2017   Procedure: COLONOSCOPY;  Surgeon: Carol Ada, MD;  Location: [facility];  Service: Endoscopy;  Laterality: Left;  . ESOPHAGOGASTRODUODENOSCOPY N/A 04/03/2017   Procedure: ESOPHAGOGASTRODUODENOSCOPY (EGD);  Surgeon: Carol Ada, MD;  Location: [facility] ENDOSCOPY;  Service: Endoscopy;  Laterality: N/A;  . ESOPHAGOGASTRODUODENOSCOPY (EGD) WITH PROPOFOL N/A 04/12/2017   Procedure: ESOPHAGOGASTRODUODENOSCOPY (EGD) WITH PROPOFOL;  Surgeon: Milus Banister, MD;  Location[facility] ENDOSCOPY;  Service: Endoscopy;  Laterality: N/A;  . IR  ANGIOGRAM SELECTIVE EACH ADDITIONAL VESSEL  04/12/2017  . IR ANGIOGRAM SELECTIVE EACH ADDITIONAL VESSEL  04/13/2017  . IR ANGIOGRAM SELECTIVE EACH ADDITIONAL VESSEL  04/13/2017  . IR ANGIOGRAM SELECTIVE EACH ADDITIONAL VESSEL  04/13/2017  . IR ANGIOGRAM VISCERAL SELECTIVE  04/12/2017  . IR ANGIOGRAM VISCERAL SELECTIVE  04/13/2017  . IR EMBO ART  VEN HEMORR LYMPH EXTRAV  INC GUIDE ROADMAPPING  04/12/2017  . IR EMBO ART  VEN HEMORR LYMPH EXTRAV  INC GUIDE ROADMAPPING  04/13/2017  . IR FLUORO GUIDE CV LINE RIGHT  04/13/2017  . IR IVC FILTER PLMT / S&I /IMG GUID/MOD SED  04/12/2017  . IR US GUIDE VASC ACCESS RIGHT  04/12/2017  . IR US GUIDE VASC ACCESS RIGHT  04/13/2017  . IR US GUIDE VASC ACCESS RIGHT  04/13/2017  . LEG SURGERY  1974   Blood Clot Removal   . VIDEO BRONCHOSCOPY WITH ENDOBRONCHIAL ULTRASOUND N/A 05/19/2017   Procedure: VIDEO BRONCHOSCOPY WITH ENDOBRONCHIAL ULTRASOUND;  Surgeon: Grace Isaac, MD;  Location: [facility] OR;  Service: Thoracic;  Laterality: N/A;    Family History: Family History  Problem Relation Age of Onset  . Heart disease Father     Social History: Social History   Socioeconomic History  . Marital status: Single    Spouse name: Not on file  . Number of children: Not on file  . Years of education: Not on file  . Highest education level: Not on file  Occupational History  . Occupation: Has to lift heavy boxes at times.    Employer: [employer]  Social Needs  . Financial resource strain: Not on file  . Food insecurity:    Worry: Not on file    Inability: Not on file  . Transportation needs:    Medical: Not on file    Non-medical: Not on file  Tobacco Use  . Smoking status: Former Smoker    Packs/day: 0.50    Years: 40.00    Pack years: 20.00    Types: Cigarettes    Last attempt to quit: 03/31/2017    Years since quitting: 0.2  . Smokeless tobacco: Never Used  Substance and Sexual Activity  . Alcohol use: No    Frequency: Never  . Drug use: No  . Sexual activity: Not  Currently  Lifestyle  . Physical activity:    Days per week: Not on file    Minutes per session: Not on file  . Stress: Not on file  Relationships  . Social connections:    Talks on phone: Not on file    Gets together: Not on file    Attends religious service: Not on file    Active member of club or organization: Not on file    Attends meetings of clubs or organizations: Not on file    Relationship status: Not on file  . Intimate partner violence:    Fear of current or ex partner: Not on file    Emotionally abused: Not on file    Physically abused: Not on file    Forced sexual activity: Not on file  Other Topics Concern  . Not on file  Social History Narrative  . Not on file    Allergies: No Known Allergies  Medications:  Current Outpatient Medications  Medication Sig Dispense Refill  . acetaminophen (TYLENOL) 325 MG tablet Take 1-2 tablets (325-650 mg total) by mouth every 4 (four) hours as needed for mild pain.    . budesonide-formoterol (SYMBICORT) 80-4.5 MCG/ACT inhaler Inhale 2 puffs into the lungs 2 (two) times daily. 1 Inhaler 12  . cholecalciferol (VITAMIN D) 1000 units tablet Take 1,000 Units by mouth 2 (two) times daily.    . collagenase (SANTYL) ointment Apply topically daily. To sacral area with damp to dry dressing. Change daily 15 g 0  . dexamethasone (DECADRON) 4 MG tablet Take 2 tablets (8 mg total) by mouth daily. Start the day after chemotherapy for 2 days. 30 tablet 1  . levETIRAcetam (KEPPRA) 500 MG tablet TK 1 T PO BID  6  . magnesium oxide (MAG-OX) 400 (241.3 Mg) MG tablet Take 0.5 tablets (200 mg total) by mouth 2 (two) times daily. 60 tablet 0  . ondansetron (ZOFRAN) 8 MG tablet Take 1 tablet (8 mg total) by mouth 2 (two) times daily as needed for refractory nausea / vomiting. Start on day 3 after chemo. 30 tablet 1  . pantoprazole (PROTONIX) 40 MG tablet TK 1 T PO BID  6  . polycarbophil (FIBERCON) 625 MG tablet Take 2 tablets (1,250 mg total) by mouth  2 (two) times daily. 120 tablet 0  . potassium chloride SA (K-DUR,KLOR-CON) 20 MEQ tablet Take 1 tablet (20 mEq total) by mouth 2 (two) times daily. 60 tablet 0  . prochlorperazine (COMPAZINE) 10 MG tablet Take 1 tablet (10 mg total) by mouth every 6 (six) hours as needed (Nausea or vomiting). 30 tablet 1  . saccharomyces boulardii (FLORASTOR) 250 MG capsule Take 1 capsule (250 mg total) by mouth 2 (two) times daily. 100 capsule 0   No current facility-administered medications for this visit.     Review of Systems: Review of Systems  All other systems reviewed and are negative.    PHYSICAL EXAMINATION Blood pressure (!) 150/50, pulse (!) 57, temperature 97.6 F (36.4 C), temperature source Oral, resp. rate 16, height '5\' 3"'$  (1.6 m), weight 127 lb 14.4 oz (58 kg), SpO2 95 %.  ECOG PERFORMANCE STATUS: 1 - Symptomatic but completely ambulatory  Physical Exam  Constitutional: She is oriented to person, place, and time.  Cachectic middle-aged female.  Does not appear to be in acute distress.  HENT:  Head: Normocephalic and atraumatic.  Mouth/Throat: Oropharynx is clear and moist. No oropharyngeal exudate.  Eyes: Pupils are equal, round, and reactive to light. Conjunctivae and EOM are normal. No scleral icterus.  Neck: No thyromegaly present.  Cardiovascular: Normal rate, regular rhythm and normal heart sounds.  No murmur heard. Pulmonary/Chest: Effort normal and breath sounds normal. No stridor. No respiratory distress. She has no wheezes.  Abdominal: Soft. Bowel sounds are normal. She exhibits no distension and no mass. There is no tenderness. There is no guarding.  Musculoskeletal: She exhibits edema.  Lymphadenopathy:    She has no cervical adenopathy.  Neurological: She is alert and oriented to person, place, and time. She displays normal reflexes. No cranial nerve deficit or sensory deficit.  Skin: Skin is warm and dry. No rash noted. No erythema. There is pallor.     LABORATORY  DATA: I have personally reviewed the data as listed: Appointment on 06/23/2017  Component Date Value Ref Range Status  . WBC Count 06/23/2017 5.8  3.9 - 10.3 K/uL Final  . RBC 06/23/2017 4.23  3.70 - 5.45  MIL/uL Final  . Hemoglobin 06/23/2017 12.1  11.6 - 15.9 g/dL Final  . HCT 06/23/2017 37.4  34.8 - 46.6 % Final  . MCV 06/23/2017 88.6  79.5 - 101.0 fL Final  . MCH 06/23/2017 28.5  25.1 - 34.0 pg Final  . MCHC 06/23/2017 32.2  31.5 - 36.0 g/dL Final  . RDW 06/23/2017 14.5  11.2 - 14.5 % Final  . Platelet Count 06/23/2017 200  145 - 400 K/uL Final  . Neutrophils Relative % 06/23/2017 63  % Final  . Neutro Abs 06/23/2017 3.7  1.5 - 6.5 K/uL Final  . Lymphocytes Relative 06/23/2017 26  % Final  . Lymphs Abs 06/23/2017 1.5  0.9 - 3.3 K/uL Final  . Monocytes Relative 06/23/2017 9  % Final  . Monocytes Absolute 06/23/2017 0.5  0.1 - 0.9 K/uL Final  . Eosinophils Relative 06/23/2017 1  % Final  . Eosinophils Absolute 06/23/2017 0.1  0.0 - 0.5 K/uL Final  . Basophils Relative 06/23/2017 1  % Final  . Basophils Absolute 06/23/2017 0.0  0.0 - 0.1 K/uL Final   Performed at [facility], [place] [address], [place], [place] [address]  . Sodium 06/23/2017 142  136 - 145 mmol/L Final  . Potassium 06/23/2017 3.4* 3.5 - 5.1 mmol/L Final  . Chloride 06/23/2017 109  98 - 109 mmol/L Final  . CO2 06/23/2017 24  22 - 29 mmol/L Final  . Glucose, Bld 06/23/2017 87  70 - 140 mg/dL Final  . BUN 06/23/2017 11  7 - 26 mg/dL Final  . Creatinine 06/23/2017 0.71  0.60 - 1.10 mg/dL Final  . Calcium 06/23/2017 9.9  8.4 - 10.4 mg/dL Final  . Total Protein 06/23/2017 7.1  6.4 - 8.3 g/dL Final  . Albumin 06/23/2017 3.3* 3.5 - 5.0 g/dL Final  . AST 06/23/2017 13  5 - 34 U/L Final  . ALT 06/23/2017 8  0 - 55 U/L Final  . Alkaline Phosphatase 06/23/2017 79  40 - 150 U/L Final  . Total Bilirubin 06/23/2017 0.2  0.2 - 1.2 mg/dL Final  . GFR, Est Non Af Am 06/23/2017 >60  >60 mL/min Final  . GFR,  Est AFR Am 06/23/2017 >60  >60 mL/min Final   Comment: (NOTE) The eGFR has been calculated using the CKD EPI equation. This calculation has not been validated in all clinical situations. eGFR's persistently <60 mL/min signify possible Chronic Kidney Disease.   Georgiann Hahn gap 06/23/2017 9  3 - 11 Final   Performed at [facility], [place] [address] [place], [place] [address]       Ardath Sax, MD

## 2017-06-23 NOTE — Patient Instructions (Addendum)
[facility] Discharge Instructions for Patients Receiving Chemotherapy  Today you received the following chemotherapy agents Taxol and Carboplatin  To help prevent nausea and vomiting after your treatment, we encourage you to take your nausea medication as prescribed.   If you develop nausea and vomiting that is not controlled by your nausea medication, call the clinic.   BELOW ARE SYMPTOMS THAT SHOULD BE REPORTED IMMEDIATELY:  *FEVER GREATER THAN 100.5 F  *CHILLS WITH OR WITHOUT FEVER  NAUSEA AND VOMITING THAT IS NOT CONTROLLED WITH YOUR NAUSEA MEDICATION  *UNUSUAL SHORTNESS OF BREATH  *UNUSUAL BRUISING OR BLEEDING  TENDERNESS IN MOUTH AND THROAT WITH OR WITHOUT PRESENCE OF ULCERS  *URINARY PROBLEMS  *BOWEL PROBLEMS  UNUSUAL RASH Items with * indicate a potential emergency and should be followed up as soon as possible.  Feel free to call the clinic should you have any questions or concerns. The clinic phone number is [phone].  Please show the [place] at check-in to the Emergency Department and triage nurse.  Paclitaxel injection (taxol) What is this medicine? PACLITAXEL (PAK li TAX el) is a chemotherapy drug. It targets fast dividing cells, like cancer cells, and causes these cells to die. This medicine is used to treat ovarian cancer, breast cancer, and other cancers. This medicine may be used for other purposes; ask your health care provider or pharmacist if you have questions. COMMON BRAND NAME(S): Onxol, Taxol What should I tell my health care provider before I take this medicine? They need to know if you have any of these conditions: -blood disorders -irregular heartbeat -infection (especially a virus infection such as chickenpox, cold sores, or herpes) -liver disease -previous or ongoing radiation therapy -an unusual or allergic reaction to paclitaxel, alcohol, polyoxyethylated castor oil, other chemotherapy agents, other medicines,  foods, dyes, or preservatives -pregnant or trying to get pregnant -breast-feeding How should I use this medicine? This drug is given as an infusion into a vein. It is administered in a hospital or clinic by a specially trained health care professional. Talk to your pediatrician regarding the use of this medicine in children. Special care may be needed. Overdosage: If you think you have taken too much of this medicine contact a poison control center or emergency room at once. NOTE: This medicine is only for you. Do not share this medicine with others. What if I miss a dose? It is important not to miss your dose. Call your doctor or health care professional if you are unable to keep an appointment. What may interact with this medicine? Do not take this medicine with any of the following medications: -disulfiram -metronidazole This medicine may also interact with the following medications: -cyclosporine -diazepam -ketoconazole -medicines to increase blood counts like filgrastim, pegfilgrastim, sargramostim -other chemotherapy drugs like cisplatin, doxorubicin, epirubicin, etoposide, teniposide, vincristine -quinidine -testosterone -vaccines -verapamil Talk to your doctor or health care professional before taking any of these medicines: -acetaminophen -aspirin -ibuprofen -ketoprofen -naproxen This list may not describe all possible interactions. Give your health care provider a list of all the medicines, herbs, non-prescription drugs, or dietary supplements you use. Also tell them if you smoke, drink alcohol, or use illegal drugs. Some items may interact with your medicine. What should I watch for while using this medicine? Your condition will be monitored carefully while you are receiving this medicine. You will need important blood work done while you are taking this medicine. This medicine can cause serious allergic reactions. To reduce your risk you will  need to take other  medicine(s) before treatment with this medicine. If you experience allergic reactions like skin rash, itching or hives, swelling of the face, lips, or tongue, tell your doctor or health care professional right away. In some cases, you may be given additional medicines to help with side effects. Follow all directions for their use. This drug may make you feel generally unwell. This is not uncommon, as chemotherapy can affect healthy cells as well as cancer cells. Report any side effects. Continue your course of treatment even though you feel ill unless your doctor tells you to stop. Call your doctor or health care professional for advice if you get a fever, chills or sore throat, or other symptoms of a cold or flu. Do not treat yourself. This drug decreases your body's ability to fight infections. Try to avoid being around people who are sick. This medicine may increase your risk to bruise or bleed. Call your doctor or health care professional if you notice any unusual bleeding. Be careful brushing and flossing your teeth or using a toothpick because you may get an infection or bleed more easily. If you have any dental work done, tell your dentist you are receiving this medicine. Avoid taking products that contain aspirin, acetaminophen, ibuprofen, naproxen, or ketoprofen unless instructed by your doctor. These medicines may hide a fever. Do not become pregnant while taking this medicine. Women should inform their doctor if they wish to become pregnant or think they might be pregnant. There is a potential for serious side effects to an unborn child. Talk to your health care professional or pharmacist for more information. Do not breast-feed an infant while taking this medicine. Men are advised not to father a child while receiving this medicine. This product may contain alcohol. Ask your pharmacist or healthcare provider if this medicine contains alcohol. Be sure to tell all healthcare providers you are  taking this medicine. Certain medicines, like metronidazole and disulfiram, can cause an unpleasant reaction when taken with alcohol. The reaction includes flushing, headache, nausea, vomiting, sweating, and increased thirst. The reaction can last from 30 minutes to several hours. What side effects may I notice from receiving this medicine? Side effects that you should report to your doctor or health care professional as soon as possible: -allergic reactions like skin rash, itching or hives, swelling of the face, lips, or tongue -low blood counts - This drug may decrease the number of white blood cells, red blood cells and platelets. You may be at increased risk for infections and bleeding. -signs of infection - fever or chills, cough, sore throat, pain or difficulty passing urine -signs of decreased platelets or bleeding - bruising, pinpoint red spots on the skin, black, tarry stools, nosebleeds -signs of decreased red blood cells - unusually weak or tired, fainting spells, lightheadedness -breathing problems -chest pain -high or low blood pressure -mouth sores -nausea and vomiting -pain, swelling, redness or irritation at the injection site -pain, tingling, numbness in the hands or feet -slow or irregular heartbeat -swelling of the ankle, feet, hands Side effects that usually do not require medical attention (report to your doctor or health care professional if they continue or are bothersome): -bone pain -complete hair loss including hair on your head, underarms, pubic hair, eyebrows, and eyelashes -changes in the color of fingernails -diarrhea -loosening of the fingernails -loss of appetite -muscle or joint pain -red flush to skin -sweating This list may not describe all possible side effects. Call your doctor for medical  advice about side effects. You may report side effects to FDA at 1-800-FDA-1088. Where should I keep my medicine? This drug is given in a hospital or clinic and  will not be stored at home. NOTE: This sheet is a summary. It may not cover all possible information. If you have questions about this medicine, talk to your doctor, pharmacist, or health care provider.  2018 Elsevier/Gold Standard (2014-12-26 19:58:00)   Carboplatin injection What is this medicine? CARBOPLATIN (KAR boe pla tin) is a chemotherapy drug. It targets fast dividing cells, like cancer cells, and causes these cells to die. This medicine is used to treat ovarian cancer and many other cancers. This medicine may be used for other purposes; ask your health care provider or pharmacist if you have questions. COMMON BRAND NAME(S): Paraplatin What should I tell my health care provider before I take this medicine? They need to know if you have any of these conditions: -blood disorders -hearing problems -kidney disease -recent or ongoing radiation therapy -an unusual or allergic reaction to carboplatin, cisplatin, other chemotherapy, other medicines, foods, dyes, or preservatives -pregnant or trying to get pregnant -breast-feeding How should I use this medicine? This drug is usually given as an infusion into a vein. It is administered in a hospital or clinic by a specially trained health care professional. Talk to your pediatrician regarding the use of this medicine in children. Special care may be needed. Overdosage: If you think you have taken too much of this medicine contact a poison control center or emergency room at once. NOTE: This medicine is only for you. Do not share this medicine with others. What if I miss a dose? It is important not to miss a dose. Call your doctor or health care professional if you are unable to keep an appointment. What may interact with this medicine? -medicines for seizures -medicines to increase blood counts like filgrastim, pegfilgrastim, sargramostim -some antibiotics like amikacin, gentamicin, neomycin, streptomycin, tobramycin -vaccines Talk to  your doctor or health care professional before taking any of these medicines: -acetaminophen -aspirin -ibuprofen -ketoprofen -naproxen This list may not describe all possible interactions. Give your health care provider a list of all the medicines, herbs, non-prescription drugs, or dietary supplements you use. Also tell them if you smoke, drink alcohol, or use illegal drugs. Some items may interact with your medicine. What should I watch for while using this medicine? Your condition will be monitored carefully while you are receiving this medicine. You will need important blood work done while you are taking this medicine. This drug may make you feel generally unwell. This is not uncommon, as chemotherapy can affect healthy cells as well as cancer cells. Report any side effects. Continue your course of treatment even though you feel ill unless your doctor tells you to stop. In some cases, you may be given additional medicines to help with side effects. Follow all directions for their use. Call your doctor or health care professional for advice if you get a fever, chills or sore throat, or other symptoms of a cold or flu. Do not treat yourself. This drug decreases your body's ability to fight infections. Try to avoid being around people who are sick. This medicine may increase your risk to bruise or bleed. Call your doctor or health care professional if you notice any unusual bleeding. Be careful brushing and flossing your teeth or using a toothpick because you may get an infection or bleed more easily. If you have any dental work done, tell  your dentist you are receiving this medicine. Avoid taking products that contain aspirin, acetaminophen, ibuprofen, naproxen, or ketoprofen unless instructed by your doctor. These medicines may hide a fever. Do not become pregnant while taking this medicine. Women should inform their doctor if they wish to become pregnant or think they might be pregnant. There is a  potential for serious side effects to an unborn child. Talk to your health care professional or pharmacist for more information. Do not breast-feed an infant while taking this medicine. What side effects may I notice from receiving this medicine? Side effects that you should report to your doctor or health care professional as soon as possible: -allergic reactions like skin rash, itching or hives, swelling of the face, lips, or tongue -signs of infection - fever or chills, cough, sore throat, pain or difficulty passing urine -signs of decreased platelets or bleeding - bruising, pinpoint red spots on the skin, black, tarry stools, nosebleeds -signs of decreased red blood cells - unusually weak or tired, fainting spells, lightheadedness -breathing problems -changes in hearing -changes in vision -chest pain -high blood pressure -low blood counts - This drug may decrease the number of white blood cells, red blood cells and platelets. You may be at increased risk for infections and bleeding. -nausea and vomiting -pain, swelling, redness or irritation at the injection site -pain, tingling, numbness in the hands or feet -problems with balance, talking, walking -trouble passing urine or change in the amount of urine Side effects that usually do not require medical attention (report to your doctor or health care professional if they continue or are bothersome): -hair loss -loss of appetite -metallic taste in the mouth or changes in taste This list may not describe all possible side effects. Call your doctor for medical advice about side effects. You may report side effects to FDA at 1-800-FDA-1088. Where should I keep my medicine? This drug is given in a hospital or clinic and will not be stored at home. NOTE: This sheet is a summary. It may not cover all possible information. If you have questions about this medicine, talk to your doctor, pharmacist, or health care provider.  2018 Elsevier/Gold  Standard (2007-06-01 14:38:05)

## 2017-06-23 NOTE — Assessment & Plan Note (Signed)
62 y.o. female with at least stage III non-small cell carcinoma of the lung plan for initiation of systemic chemotherapy with carboplatin and paclitaxel by Dr. Irene Limbo.   Patient appears to be reasonably well informed regarding the diagnosis and plan therapy.  As this is her first treatment, I have reviewed the diagnosis, proposed treatment sequence, component medications, and schedule of administration with the patient.  I also have briefly reviewed most prominent potential side effects including neutropenic fever, nausea, vomiting and others with the patient.  She confirms her continued agreement to proceed with the recommended therapy.  Informed consent obtained.  Clinical evaluation lab work today permissive to proceed with recommended therapy.  Patient does have low potassium level, but already has potassium supplement prescribed which she has not been taking yet.  Plan: -Proceed with potassium replacement of 20 mEq twice daily -Proceed with initial cycle of carboplatin and paclitaxel systemic therapy. -Return to clinic in 1 week with Dr Irene Limbo for toxicity assessment.

## 2017-06-23 NOTE — Telephone Encounter (Signed)
Appts already scheduled per 4/16 los - printed out calender and AVS

## 2017-06-23 NOTE — Progress Notes (Signed)
Discussed pt's lab result with Dr. Lebron Conners [facility] out of office.) Potassium 3.4, pt confirmed she is taking 57meq PO BID at home. Per Dr. Lebron Conners: Give 29meq PO today. Discontinue order for IV potassium. Maudie Mercury, Infusion RN made aware.

## 2017-06-24 ENCOUNTER — Encounter: Payer: Self-pay | Admitting: General Practice

## 2017-06-24 NOTE — Progress Notes (Signed)
[facility] CSW Progress Note  Per [employer], application for social security disability has been submitted as of June 22, 2017.  Edwyna Shell, LCSW Clinical Social Worker Phone:  [phone]

## 2017-06-25 ENCOUNTER — Inpatient Hospital Stay: Payer: BLUE CROSS/BLUE SHIELD

## 2017-06-25 DIAGNOSIS — Z5111 Encounter for antineoplastic chemotherapy: Secondary | ICD-10-CM | POA: Diagnosis not present

## 2017-06-25 DIAGNOSIS — Z7189 Other specified counseling: Secondary | ICD-10-CM

## 2017-06-25 DIAGNOSIS — C3490 Malignant neoplasm of unspecified part of unspecified bronchus or lung: Secondary | ICD-10-CM

## 2017-06-25 LAB — EPIDERMAL GROWTH FACTOR RECEPTOR (EGFR) MUTATION ANALYSIS

## 2017-06-25 MED ORDER — PEGFILGRASTIM-CBQV 6 MG/0.6ML ~~LOC~~ SOSY
6.0000 mg | PREFILLED_SYRINGE | Freq: Once | SUBCUTANEOUS | Status: AC
  Administered 2017-06-25: 6 mg via SUBCUTANEOUS

## 2017-06-25 MED ORDER — PEGFILGRASTIM-CBQV 6 MG/0.6ML ~~LOC~~ SOSY
PREFILLED_SYRINGE | SUBCUTANEOUS | Status: AC
  Filled 2017-06-25: qty 0.6

## 2017-06-30 ENCOUNTER — Telehealth: Payer: Self-pay | Admitting: Hematology and Oncology

## 2017-06-30 ENCOUNTER — Encounter: Payer: Self-pay | Admitting: Hematology and Oncology

## 2017-06-30 ENCOUNTER — Inpatient Hospital Stay (HOSPITAL_BASED_OUTPATIENT_CLINIC_OR_DEPARTMENT_OTHER): Payer: BLUE CROSS/BLUE SHIELD | Admitting: Hematology and Oncology

## 2017-06-30 ENCOUNTER — Inpatient Hospital Stay: Payer: BLUE CROSS/BLUE SHIELD

## 2017-06-30 VITALS — BP 136/54 | HR 81 | Temp 98.3°F | Resp 17 | Ht 63.0 in | Wt 127.6 lb

## 2017-06-30 DIAGNOSIS — I10 Essential (primary) hypertension: Secondary | ICD-10-CM

## 2017-06-30 DIAGNOSIS — C3490 Malignant neoplasm of unspecified part of unspecified bronchus or lung: Secondary | ICD-10-CM

## 2017-06-30 DIAGNOSIS — R197 Diarrhea, unspecified: Secondary | ICD-10-CM

## 2017-06-30 DIAGNOSIS — M7918 Myalgia, other site: Secondary | ICD-10-CM | POA: Diagnosis not present

## 2017-06-30 DIAGNOSIS — B370 Candidal stomatitis: Secondary | ICD-10-CM | POA: Diagnosis not present

## 2017-06-30 DIAGNOSIS — Z5111 Encounter for antineoplastic chemotherapy: Secondary | ICD-10-CM | POA: Diagnosis not present

## 2017-06-30 LAB — CMP (CANCER CENTER ONLY)
ALT: 13 U/L (ref 0–55)
AST: 15 U/L (ref 5–34)
Albumin: 3.4 g/dL — ABNORMAL LOW (ref 3.5–5.0)
Alkaline Phosphatase: 101 U/L (ref 40–150)
Anion gap: 9 (ref 3–11)
BUN: 18 mg/dL (ref 7–26)
CO2: 26 mmol/L (ref 22–29)
Calcium: 9.6 mg/dL (ref 8.4–10.4)
Chloride: 107 mmol/L (ref 98–109)
Creatinine: 0.77 mg/dL (ref 0.60–1.10)
GFR, Est AFR Am: >60 mL/min (ref >60)
GFR, Estimated: >60 mL/min (ref >60)
Glucose, Bld: 90 mg/dL (ref 70–140)
Potassium: 4.5 mmol/L (ref 3.5–5.1)
Sodium: 142 mmol/L (ref 136–145)
Total Bilirubin: <0.2 mg/dL — ABNORMAL LOW (ref 0.2–1.2)
Total Protein: 6.3 g/dL — ABNORMAL LOW (ref 6.4–8.3)

## 2017-06-30 LAB — CBC WITH DIFFERENTIAL (CANCER CENTER ONLY)
Basophils Absolute: 0 10*3/uL (ref 0.0–0.1)
Basophils Relative: 0 %
Eosinophils Absolute: 0.1 10*3/uL (ref 0.0–0.5)
Eosinophils Relative: 1 %
HCT: 36.5 % (ref 34.8–46.6)
Hemoglobin: 11.7 g/dL (ref 11.6–15.9)
Lymphocytes Relative: 16 %
Lymphs Abs: 2.6 10*3/uL (ref 0.9–3.3)
MCH: 28.7 pg (ref 25.1–34.0)
MCHC: 32.1 g/dL (ref 31.5–36.0)
MCV: 89.5 fL (ref 79.5–101.0)
Monocytes Absolute: 1.8 10*3/uL — ABNORMAL HIGH (ref 0.1–0.9)
Monocytes Relative: 11 %
Neutro Abs: 11.5 10*3/uL — ABNORMAL HIGH (ref 1.5–6.5)
Neutrophils Relative %: 72 %
Platelet Count: 131 10*3/uL — ABNORMAL LOW (ref 145–400)
RBC: 4.08 MIL/uL (ref 3.70–5.45)
RDW: 14.1 % (ref 11.2–14.5)
WBC Count: 16 10*3/uL — ABNORMAL HIGH (ref 3.9–10.3)

## 2017-06-30 LAB — MAGNESIUM: Magnesium: 1.6 mg/dL — ABNORMAL LOW (ref 1.7–2.4)

## 2017-06-30 MED ORDER — FLUCONAZOLE 200 MG PO TABS: 200.0000 mg | ORAL_TABLET | Freq: Every day | ORAL | 0 refills | Status: AC

## 2017-06-30 MED ORDER — ALUM & MAG HYDROXIDE-SIMETH 400-400-40 MG/5ML PO SUSP: 10.0000 mL | Freq: Four times a day (QID) | ORAL | 0 refills | Status: DC | PRN

## 2017-06-30 NOTE — Assessment & Plan Note (Signed)
62 y.o. female with at least stage III non-small cell carcinoma of the lung started on systemic chemotherapy with carboplatin and paclitaxel by Dr. Irene Wyatt.  She has received initial round of treatment last week.  Tolerated infusion without difficulties, but subsequently developed muscle aches and what appears to be oral and possible oropharyngeal thrush.  Patient remains afebrile.  White blood cell count is still above neutropenic threshold.  Lab work obtained today demonstrates decreased magnesium level.  Plan: -Fluconazole 200 mg daily for 10 days for thrush. -Patient is currently taking magnesium oxide, but does not tolerate full dose due to diarrhea.  Will prescribe Maalox as the motility-neutral agent containing magnesium. -Return to clinic in 2 week with Dr Kayla Wyatt for toxicity monitoring and possible next cycle of systemic chemotherapy.

## 2017-06-30 NOTE — Progress Notes (Signed)
[facility] [facility] Follow-up Visit:  Assessment: Non-small cell lung cancer [facility]) 62 y.o. female with at least stage III non-small cell carcinoma of the lung started on systemic chemotherapy with carboplatin and paclitaxel by Dr. Irene Limbo.  She has received initial round of treatment last week.  Tolerated infusion without difficulties, but subsequently developed muscle aches and what appears to be oral and possible oropharyngeal thrush.  Patient remains afebrile.  White blood cell count is still above neutropenic threshold.  Lab work obtained today demonstrates decreased magnesium level.  Plan: -Fluconazole 200 mg daily for 10 days for thrush. -Patient is currently taking magnesium oxide, but does not tolerate full dose due to diarrhea.  Will prescribe Maalox as the motility-neutral agent containing magnesium. -Return to clinic in 2 week with Dr Irene Limbo for toxicity monitoring and possible next cycle of systemic chemotherapy.   Voice recognition software was used and creation of this note. Despite my best effort at editing the text, some misspelling/errors may have occurred.  Orders Placed This Encounter  Procedures  . CBC with Differential (Cancer Center Only)    Standing Status:   Future    Standing Expiration Date:   07/01/2018  . CMP ([place] only)    Standing Status:   Future    Standing Expiration Date:   07/01/2018  . Magnesium    Standing Status:   Future    Standing Expiration Date:   06/30/2018  . Phosphorus    Standing Status:   Future    Standing Expiration Date:   06/30/2018    Cancer Staging Non-small cell lung cancer [facility]) Staging form: Lung, AJCC 8th Edition - Clinical: No stage assigned - Unsigned   All questions were answered.  . The patient knows to call the clinic with any problems, questions or concerns.  This note was electronically signed.    History of Presenting Illness Kayla Wyatt 62 y.o. presenting to the [facility] for initiation  of induction systemic chemotherapy with carboplatin and paclitaxel for diagnosis of at least stage III non-small cell lung cancer.  Her primary oncologist is Dr. Irene Limbo and I am seeing the patient in his absence.    Returns to the clinic for toxicity monitoring after receiving the initial dose of systemic chemotherapy.  Patient tolerated infusion without difficulties.  No breakthrough nausea subsequently.  Patient's complaints include muscle achiness in bilateral thighs as well as development of mouth sores.  Patient denies any interval fevers, chills, night sweats.  No nausea, abdominal pain, diarrhea, or constipation.  No new neurological deficits.  Oncological/hematological History:   Non-small cell lung cancer ([place])   06/05/2017 Initial Diagnosis    Non-small cell lung cancer ([place])      06/23/2017 -  Chemotherapy    Neo-adjuvant chemotherapy: CARBOplatin (PARAPLATIN) AUC5, d1 + PACLitaxel 175 mg/m2, d1 Q21d with G-CSF support --Cycle #1, 06/23/17:       Medical History: Past Medical History:  Diagnosis Date  . Acute blood loss anemia 04/02/2017  . Anxiety   . Gastrointestinal hemorrhage with melena   . Headache   . Hypercholesteremia   . Hypertension   . Persistent atrial fibrillation with rapid ventricular response ([place]) 04/07/2017  . Seizures ([place])    04/2017 while in hospital at [facility]  . Vitamin D deficiency     Surgical History: Past Surgical History:  Procedure Laterality Date  . COLONOSCOPY Left 04/09/2017   Procedure: COLONOSCOPY;  Surgeon: Carol Ada, MD;  Location: [place];  Service: Endoscopy;  Laterality:  Left;  . ESOPHAGOGASTRODUODENOSCOPY N/A 04/03/2017   Procedure: ESOPHAGOGASTRODUODENOSCOPY (EGD);  Surgeon: Carol Ada, MD;  Location: [place];  Service: Endoscopy;  Laterality: N/A;  . ESOPHAGOGASTRODUODENOSCOPY (EGD) WITH PROPOFOL N/A 04/12/2017   Procedure: ESOPHAGOGASTRODUODENOSCOPY (EGD) WITH PROPOFOL;  Surgeon: Milus Banister, MD;  Location[facility]  ENDOSCOPY;  Service: Endoscopy;  Laterality: N/A;  . IR ANGIOGRAM SELECTIVE EACH ADDITIONAL VESSEL  04/12/2017  . IR ANGIOGRAM SELECTIVE EACH ADDITIONAL VESSEL  04/13/2017  . IR ANGIOGRAM SELECTIVE EACH ADDITIONAL VESSEL  04/13/2017  . IR ANGIOGRAM SELECTIVE EACH ADDITIONAL VESSEL  04/13/2017  . IR ANGIOGRAM VISCERAL SELECTIVE  04/12/2017  . IR ANGIOGRAM VISCERAL SELECTIVE  04/13/2017  . IR EMBO ART  VEN HEMORR LYMPH EXTRAV  INC GUIDE ROADMAPPING  04/12/2017  . IR EMBO ART  VEN HEMORR LYMPH EXTRAV  INC GUIDE ROADMAPPING  04/13/2017  . IR FLUORO GUIDE CV LINE RIGHT  04/13/2017  . IR IVC FILTER PLMT / S&I /IMG GUID/MOD SED  04/12/2017  . IR US GUIDE VASC ACCESS RIGHT  04/12/2017  . IR US GUIDE VASC ACCESS RIGHT  04/13/2017  . IR US GUIDE VASC ACCESS RIGHT  04/13/2017  . LEG SURGERY  1974   Blood Clot Removal   . VIDEO BRONCHOSCOPY WITH ENDOBRONCHIAL ULTRASOUND N/A 05/19/2017   Procedure: VIDEO BRONCHOSCOPY WITH ENDOBRONCHIAL ULTRASOUND;  Surgeon: Grace Isaac, MD;  Location: [facility] OR;  Service: Thoracic;  Laterality: N/A;    Family History: Family History  Problem Relation Age of Onset  . Heart disease Father     Social History: Social History   Socioeconomic History  . Marital status: Single    Spouse name: Not on file  . Number of children: Not on file  . Years of education: Not on file  . Highest education level: Not on file  Occupational History  . Occupation: Has to lift heavy boxes at times.    Employer: [employer]  Social Needs  . Financial resource strain: Not on file  . Food insecurity:    Worry: Not on file    Inability: Not on file  . Transportation needs:    Medical: Not on file    Non-medical: Not on file  Tobacco Use  . Smoking status: Former Smoker    Packs/day: 0.50    Years: 40.00    Pack years: 20.00    Types: Cigarettes    Last attempt to quit: 03/31/2017    Years since quitting: 0.2  . Smokeless tobacco: Never Used  Substance and Sexual Activity  . Alcohol use: No     Frequency: Never  . Drug use: No  . Sexual activity: Not Currently  Lifestyle  . Physical activity:    Days per week: Not on file    Minutes per session: Not on file  . Stress: Not on file  Relationships  . Social connections:    Talks on phone: Not on file    Gets together: Not on file    Attends religious service: Not on file    Active member of club or organization: Not on file    Attends meetings of clubs or organizations: Not on file    Relationship status: Not on file  . Intimate partner violence:    Fear of current or ex partner: Not on file    Emotionally abused: Not on file    Physically abused: Not on file    Forced sexual activity: Not on file  Other Topics Concern  . Not on file  Social History Narrative  . Not on file    Allergies: No Known Allergies  Medications:  Current Outpatient Medications  Medication Sig Dispense Refill  . acetaminophen (TYLENOL) 325 MG tablet Take 1-2 tablets (325-650 mg total) by mouth every 4 (four) hours as needed for mild pain.    . budesonide-formoterol (SYMBICORT) 80-4.5 MCG/ACT inhaler Inhale 2 puffs into the lungs 2 (two) times daily. 1 Inhaler 12  . cholecalciferol (VITAMIN D) 1000 units tablet Take 1,000 Units by mouth 2 (two) times daily.    . collagenase (SANTYL) ointment Apply topically daily. To sacral area with damp to dry dressing. Change daily 15 g 0  . dexamethasone (DECADRON) 4 MG tablet Take 2 tablets (8 mg total) by mouth daily. Start the day after chemotherapy for 2 days. 30 tablet 1  . levETIRAcetam (KEPPRA) 500 MG tablet TK 1 T PO BID  6  . magnesium oxide (MAG-OX) 400 (241.3 Mg) MG tablet Take 0.5 tablets (200 mg total) by mouth 2 (two) times daily. 60 tablet 0  . ondansetron (ZOFRAN) 8 MG tablet Take 1 tablet (8 mg total) by mouth 2 (two) times daily as needed for refractory nausea / vomiting. Start on day 3 after chemo. 30 tablet 1  . pantoprazole (PROTONIX) 40 MG tablet TK 1 T PO BID  6  . polycarbophil  (FIBERCON) 625 MG tablet Take 2 tablets (1,250 mg total) by mouth 2 (two) times daily. 120 tablet 0  . potassium chloride SA (K-DUR,KLOR-CON) 20 MEQ tablet Take 1 tablet (20 mEq total) by mouth 2 (two) times daily. 60 tablet 0  . prochlorperazine (COMPAZINE) 10 MG tablet Take 1 tablet (10 mg total) by mouth every 6 (six) hours as needed (Nausea or vomiting). 30 tablet 1  . saccharomyces boulardii (FLORASTOR) 250 MG capsule Take 1 capsule (250 mg total) by mouth 2 (two) times daily. 100 capsule 0  . alum & mag hydroxide-simeth (MAALOX MULTI SYMPTOM MAX ST) 076-226-33 MG/5ML suspension Take 10 mLs by mouth every 6 (six) hours as needed for indigestion. 355 mL 0  . fluconazole (DIFLUCAN) 200 MG tablet Take 1 tablet (200 mg total) by mouth daily for 10 days. 10 tablet 0   No current facility-administered medications for this visit.     Review of Systems: Review of Systems  HENT:   Positive for mouth sores.   All other systems reviewed and are negative.    PHYSICAL EXAMINATION Blood pressure (!) 136/54, pulse 81, temperature 98.3 F (36.8 C), temperature source Oral, resp. rate 17, height '5\' 3"'$  (1.6 m), weight 127 lb 9.6 oz (57.9 kg), SpO2 98 %.  ECOG PERFORMANCE STATUS: 1 - Symptomatic but completely ambulatory  Physical Exam  Constitutional: She is oriented to person, place, and time.  Cachectic middle-aged female.  Does not appear to be in acute distress.  HENT:  Head: Normocephalic and atraumatic.  Mouth/Throat: Oropharyngeal exudate present.  Punctate white patches over the soft and hard palate and oropharynx.  No ulcerations or bleeding.  Eyes: Pupils are equal, round, and reactive to light. Conjunctivae and EOM are normal. No scleral icterus.  Neck: No thyromegaly present.  Cardiovascular: Normal rate, regular rhythm and normal heart sounds.  No murmur heard. Pulmonary/Chest: Effort normal and breath sounds normal. No stridor. No respiratory distress. She has no wheezes.   Abdominal: Soft. Bowel sounds are normal. She exhibits no distension and no mass. There is no tenderness. There is no guarding.  Musculoskeletal: She exhibits edema.  Lymphadenopathy:  She has no cervical adenopathy.  Neurological: She is alert and oriented to person, place, and time. She displays normal reflexes. No cranial nerve deficit or sensory deficit.  Skin: Skin is warm and dry. No rash noted. No erythema. There is pallor.     LABORATORY DATA: I have personally reviewed the data as listed: Appointment on 06/30/2017  Component Date Value Ref Range Status  . WBC Count 06/30/2017 16.0* 3.9 - 10.3 K/uL Final  . RBC 06/30/2017 4.08  3.70 - 5.45 MIL/uL Final  . Hemoglobin 06/30/2017 11.7  11.6 - 15.9 g/dL Final  . HCT 06/30/2017 36.5  34.8 - 46.6 % Final  . MCV 06/30/2017 89.5  79.5 - 101.0 fL Final  . MCH 06/30/2017 28.7  25.1 - 34.0 pg Final  . MCHC 06/30/2017 32.1  31.5 - 36.0 g/dL Final  . RDW 06/30/2017 14.1  11.2 - 14.5 % Final  . Platelet Count 06/30/2017 131* 145 - 400 K/uL Final  . Neutrophils Relative % 06/30/2017 72  % Final  . Neutro Abs 06/30/2017 11.5* 1.5 - 6.5 K/uL Final  . Lymphocytes Relative 06/30/2017 16  % Final  . Lymphs Abs 06/30/2017 2.6  0.9 - 3.3 K/uL Final  . Monocytes Relative 06/30/2017 11  % Final  . Monocytes Absolute 06/30/2017 1.8* 0.1 - 0.9 K/uL Final  . Eosinophils Relative 06/30/2017 1  % Final  . Eosinophils Absolute 06/30/2017 0.1  0.0 - 0.5 K/uL Final  . Basophils Relative 06/30/2017 0  % Final  . Basophils Absolute 06/30/2017 0.0  0.0 - 0.1 K/uL Final   Performed at [facility], [place] [address], [place], [place] [address]  . Sodium 06/30/2017 142  136 - 145 mmol/L Final  . Potassium 06/30/2017 4.5  3.5 - 5.1 mmol/L Final  . Chloride 06/30/2017 107  98 - 109 mmol/L Final  . CO2 06/30/2017 26  22 - 29 mmol/L Final  . Glucose, Bld 06/30/2017 90  70 - 140 mg/dL Final  . BUN 06/30/2017 18  7 - 26 mg/dL Final  .  Creatinine 06/30/2017 0.77  0.60 - 1.10 mg/dL Final  . Calcium 06/30/2017 9.6  8.4 - 10.4 mg/dL Final  . Total Protein 06/30/2017 6.3* 6.4 - 8.3 g/dL Final  . Albumin 06/30/2017 3.4* 3.5 - 5.0 g/dL Final  . AST 06/30/2017 15  5 - 34 U/L Final  . ALT 06/30/2017 13  0 - 55 U/L Final  . Alkaline Phosphatase 06/30/2017 101  40 - 150 U/L Final  . Total Bilirubin 06/30/2017 <0.2* 0.2 - 1.2 mg/dL Final  . GFR, Est Non Af Am 06/30/2017 >60  >60 mL/min Final  . GFR, Est AFR Am 06/30/2017 >60  >60 mL/min Final   Comment: (NOTE) The eGFR has been calculated using the CKD EPI equation. This calculation has not been validated in all clinical situations. eGFR's persistently <60 mL/min signify possible Chronic Kidney Disease.   Georgiann Hahn gap 06/30/2017 9  3 - 11 Final   Performed at [facility], [place] [address], [place], [place] [address]  . Magnesium 06/30/2017 1.6* 1.7 - 2.4 mg/dL Final   Performed at [facility], [place] [address], [place], [place] [address]       Ardath Sax, MD

## 2017-06-30 NOTE — Telephone Encounter (Signed)
Appointments scheduled AVS/Calendar printed per 4/23 los

## 2017-07-09 ENCOUNTER — Ambulatory Visit
Admission: RE | Admit: 2017-07-09 | Discharge: 2017-07-09 | Disposition: A | Discharge status: Home / Self Care | Payer: BLUE CROSS/BLUE SHIELD | Source: Ambulatory Visit | Attending: Radiation Oncology | Admitting: Radiation Oncology

## 2017-07-09 DIAGNOSIS — I6783 Posterior reversible encephalopathy syndrome: Secondary | ICD-10-CM

## 2017-07-14 ENCOUNTER — Encounter: Payer: Self-pay | Admitting: Hematology

## 2017-07-14 ENCOUNTER — Inpatient Hospital Stay: Payer: BLUE CROSS/BLUE SHIELD

## 2017-07-14 ENCOUNTER — Inpatient Hospital Stay: Payer: BLUE CROSS/BLUE SHIELD | Attending: Hematology | Admitting: Hematology

## 2017-07-14 VITALS — BP 142/51 | HR 68 | Temp 97.7°F | Resp 17 | Ht 63.0 in | Wt 128.1 lb

## 2017-07-14 DIAGNOSIS — B370 Candidal stomatitis: Secondary | ICD-10-CM

## 2017-07-14 DIAGNOSIS — E876 Hypokalemia: Secondary | ICD-10-CM

## 2017-07-14 DIAGNOSIS — C3490 Malignant neoplasm of unspecified part of unspecified bronchus or lung: Secondary | ICD-10-CM | POA: Diagnosis not present

## 2017-07-14 DIAGNOSIS — Z5189 Encounter for other specified aftercare: Secondary | ICD-10-CM | POA: Diagnosis not present

## 2017-07-14 DIAGNOSIS — I824Z1 Acute embolism and thrombosis of unspecified deep veins of right distal lower extremity: Secondary | ICD-10-CM

## 2017-07-14 DIAGNOSIS — R197 Diarrhea, unspecified: Secondary | ICD-10-CM | POA: Diagnosis not present

## 2017-07-14 DIAGNOSIS — C3491 Malignant neoplasm of unspecified part of right bronchus or lung: Secondary | ICD-10-CM

## 2017-07-14 DIAGNOSIS — M898X9 Other specified disorders of bone, unspecified site: Secondary | ICD-10-CM

## 2017-07-14 DIAGNOSIS — Z5111 Encounter for antineoplastic chemotherapy: Secondary | ICD-10-CM | POA: Insufficient documentation

## 2017-07-14 DIAGNOSIS — Z7189 Other specified counseling: Secondary | ICD-10-CM

## 2017-07-14 DIAGNOSIS — Z7901 Long term (current) use of anticoagulants: Secondary | ICD-10-CM

## 2017-07-14 LAB — CMP (CANCER CENTER ONLY)
ALT: 10 U/L (ref 0–55)
AST: 16 U/L (ref 5–34)
Albumin: 4 g/dL (ref 3.5–5.0)
Alkaline Phosphatase: 82 U/L (ref 40–150)
Anion gap: 9 (ref 3–11)
BUN: 15 mg/dL (ref 7–26)
CO2: 24 mmol/L (ref 22–29)
Calcium: 10.2 mg/dL (ref 8.4–10.4)
Chloride: 110 mmol/L — ABNORMAL HIGH (ref 98–109)
Creatinine: 0.73 mg/dL (ref 0.60–1.10)
GFR, Est AFR Am: >60 mL/min (ref >60)
GFR, Estimated: >60 mL/min (ref >60)
Glucose, Bld: 80 mg/dL (ref 70–140)
Potassium: 3.5 mmol/L (ref 3.5–5.1)
Sodium: 143 mmol/L (ref 136–145)
Total Bilirubin: 0.2 mg/dL (ref 0.2–1.2)
Total Protein: 7.6 g/dL (ref 6.4–8.3)

## 2017-07-14 LAB — CBC WITH DIFFERENTIAL (CANCER CENTER ONLY)
Basophils Absolute: 0 10*3/uL (ref 0.0–0.1)
Basophils Relative: 0 %
Eosinophils Absolute: 0 10*3/uL (ref 0.0–0.5)
Eosinophils Relative: 0 %
HCT: 40.9 % (ref 34.8–46.6)
Hemoglobin: 13.1 g/dL (ref 11.6–15.9)
Lymphocytes Relative: 30 %
Lymphs Abs: 2.1 10*3/uL (ref 0.9–3.3)
MCH: 28.7 pg (ref 25.1–34.0)
MCHC: 32 g/dL (ref 31.5–36.0)
MCV: 89.7 fL (ref 79.5–101.0)
Monocytes Absolute: 0.4 10*3/uL (ref 0.1–0.9)
Monocytes Relative: 6 %
Neutro Abs: 4.5 10*3/uL (ref 1.5–6.5)
Neutrophils Relative %: 64 %
Platelet Count: 204 10*3/uL (ref 145–400)
RBC: 4.56 MIL/uL (ref 3.70–5.45)
RDW: 15.4 % — ABNORMAL HIGH (ref 11.2–14.5)
WBC Count: 7.1 10*3/uL (ref 3.9–10.3)

## 2017-07-14 LAB — PHOSPHORUS: Phosphorus: 4 mg/dL (ref 2.5–4.6)

## 2017-07-14 LAB — MAGNESIUM: Magnesium: 1.6 mg/dL — ABNORMAL LOW (ref 1.7–2.4)

## 2017-07-14 MED ORDER — FOSAPREPITANT DIMEGLUMINE INJECTION 150 MG
Freq: Once | INTRAVENOUS | Status: AC
  Administered 2017-07-14: 12:00:00 via INTRAVENOUS
  Filled 2017-07-14: qty 5

## 2017-07-14 MED ORDER — DIPHENHYDRAMINE HCL 50 MG/ML IJ SOLN
50.0000 mg | Freq: Once | INTRAMUSCULAR | Status: AC
  Administered 2017-07-14: 50 mg via INTRAVENOUS

## 2017-07-14 MED ORDER — DIPHENHYDRAMINE HCL 50 MG/ML IJ SOLN
INTRAMUSCULAR | Status: AC
Start: 2017-07-14 — End: ?
  Filled 2017-07-14: qty 1

## 2017-07-14 MED ORDER — PALONOSETRON HCL INJECTION 0.25 MG/5ML
0.2500 mg | Freq: Once | INTRAVENOUS | Status: AC
  Administered 2017-07-14: 0.25 mg via INTRAVENOUS

## 2017-07-14 MED ORDER — SODIUM CHLORIDE 0.9 % IV SOLN
450.0000 mg | Freq: Once | INTRAVENOUS | Status: AC
  Administered 2017-07-14: 450 mg via INTRAVENOUS
  Filled 2017-07-14: qty 45

## 2017-07-14 MED ORDER — PALONOSETRON HCL INJECTION 0.25 MG/5ML
INTRAVENOUS | Status: AC
Start: 2017-07-14 — End: ?
  Filled 2017-07-14: qty 5

## 2017-07-14 MED ORDER — SODIUM CHLORIDE 0.9 % IV SOLN
175.0000 mg/m2 | Freq: Once | INTRAVENOUS | Status: AC
  Administered 2017-07-14: 282 mg via INTRAVENOUS
  Filled 2017-07-14: qty 47

## 2017-07-14 MED ORDER — FAMOTIDINE IN NACL 20-0.9 MG/50ML-% IV SOLN
20.0000 mg | Freq: Once | INTRAVENOUS | Status: AC
  Administered 2017-07-14: 20 mg via INTRAVENOUS

## 2017-07-14 MED ORDER — SODIUM CHLORIDE 0.9 % IV SOLN
Freq: Once | INTRAVENOUS | Status: AC
  Administered 2017-07-14: 11:00:00 via INTRAVENOUS

## 2017-07-14 MED ORDER — FAMOTIDINE IN NACL 20-0.9 MG/50ML-% IV SOLN
INTRAVENOUS | Status: AC
  Filled 2017-07-14: qty 50

## 2017-07-14 NOTE — Progress Notes (Signed)
Nutrition Follow-up:  Patient with non-small cell lung cancer.  Patient followed by Dr. Irene Limbo.    Met with patient during infusion this am.  Patient reports that appetite has been good over the past few weeks following treatment.  Reports thrush and sore mouth during that time period but medication helped.  Still eating 3 meals per day and drinking 1 ensure enlive or premier protein daily.  Reports that she likes the premier protein better.    Reports frequent bowel movements 3-4 times per day but reports MD planning to adjust Magnesium medication to help with frequent stools.     Medications: reviewed  Labs: Mag 1.6 , phosphorus WNL  Anthropometrics:   Weight increasd to 128 lb 1.6 oz from 127 lb 14.4 oz on 4/16.    UBW 140-150s prior to extended hospitalization   NUTRITION DIAGNOSIS: Inadequate oral intake improving   MALNUTRITION DIAGNOSIS: Severe malnutrition improving   INTERVENTION:   Encourage patient to continue to consume good sources of protein at every meal.  Examples provided.   Encouraged patient to continue oral nutrition supplement shakes at least 2 times per day. Discussed how patient can incorporate into day.  Coupons given.    MONITORING, EVALUATION, GOAL: weight trends, intake   NEXT VISIT: as needed  Tatyanna Cronk B. Zenia Resides, [place], [place] Registered Dietitian [phone] (pager)

## 2017-07-14 NOTE — Patient Instructions (Signed)
[facility] Discharge Instructions for Patients Receiving Chemotherapy  Today you received the following chemotherapy agents Taxol and Carboplatin  To help prevent nausea and vomiting after your treatment, we encourage you to take your nausea medication as prescribed.   If you develop nausea and vomiting that is not controlled by your nausea medication, call the clinic.   BELOW ARE SYMPTOMS THAT SHOULD BE REPORTED IMMEDIATELY:  *FEVER GREATER THAN 100.5 F  *CHILLS WITH OR WITHOUT FEVER  NAUSEA AND VOMITING THAT IS NOT CONTROLLED WITH YOUR NAUSEA MEDICATION  *UNUSUAL SHORTNESS OF BREATH  *UNUSUAL BRUISING OR BLEEDING  TENDERNESS IN MOUTH AND THROAT WITH OR WITHOUT PRESENCE OF ULCERS  *URINARY PROBLEMS  *BOWEL PROBLEMS  UNUSUAL RASH Items with * indicate a potential emergency and should be followed up as soon as possible.  Feel free to call the clinic should you have any questions or concerns. The clinic phone number is [phone].  Please show the [facility] at check-in to the Emergency Department and triage nurse.  Paclitaxel injection (taxol) What is this medicine? PACLITAXEL (PAK li TAX el) is a chemotherapy drug. It targets fast dividing cells, like cancer cells, and causes these cells to die. This medicine is used to treat ovarian cancer, breast cancer, and other cancers. This medicine may be used for other purposes; ask your health care provider or pharmacist if you have questions. COMMON BRAND NAME(S): Onxol, Taxol What should I tell my health care provider before I take this medicine? They need to know if you have any of these conditions: -blood disorders -irregular heartbeat -infection (especially a virus infection such as chickenpox, cold sores, or herpes) -liver disease -previous or ongoing radiation therapy -an unusual or allergic reaction to paclitaxel, alcohol, polyoxyethylated castor oil, other chemotherapy agents, other medicines,  foods, dyes, or preservatives -pregnant or trying to get pregnant -breast-feeding How should I use this medicine? This drug is given as an infusion into a vein. It is administered in a hospital or clinic by a specially trained health care professional. Talk to your pediatrician regarding the use of this medicine in children. Special care may be needed. Overdosage: If you think you have taken too much of this medicine contact a poison control center or emergency room at once. NOTE: This medicine is only for you. Do not share this medicine with others. What if I miss a dose? It is important not to miss your dose. Call your doctor or health care professional if you are unable to keep an appointment. What may interact with this medicine? Do not take this medicine with any of the following medications: -disulfiram -metronidazole This medicine may also interact with the following medications: -cyclosporine -diazepam -ketoconazole -medicines to increase blood counts like filgrastim, pegfilgrastim, sargramostim -other chemotherapy drugs like cisplatin, doxorubicin, epirubicin, etoposide, teniposide, vincristine -quinidine -testosterone -vaccines -verapamil Talk to your doctor or health care professional before taking any of these medicines: -acetaminophen -aspirin -ibuprofen -ketoprofen -naproxen This list may not describe all possible interactions. Give your health care provider a list of all the medicines, herbs, non-prescription drugs, or dietary supplements you use. Also tell them if you smoke, drink alcohol, or use illegal drugs. Some items may interact with your medicine. What should I watch for while using this medicine? Your condition will be monitored carefully while you are receiving this medicine. You will need important blood work done while you are taking this medicine. This medicine can cause serious allergic reactions. To reduce your risk you will  need to take other  medicine(s) before treatment with this medicine. If you experience allergic reactions like skin rash, itching or hives, swelling of the face, lips, or tongue, tell your doctor or health care professional right away. In some cases, you may be given additional medicines to help with side effects. Follow all directions for their use. This drug may make you feel generally unwell. This is not uncommon, as chemotherapy can affect healthy cells as well as cancer cells. Report any side effects. Continue your course of treatment even though you feel ill unless your doctor tells you to stop. Call your doctor or health care professional for advice if you get a fever, chills or sore throat, or other symptoms of a cold or flu. Do not treat yourself. This drug decreases your body's ability to fight infections. Try to avoid being around people who are sick. This medicine may increase your risk to bruise or bleed. Call your doctor or health care professional if you notice any unusual bleeding. Be careful brushing and flossing your teeth or using a toothpick because you may get an infection or bleed more easily. If you have any dental work done, tell your dentist you are receiving this medicine. Avoid taking products that contain aspirin, acetaminophen, ibuprofen, naproxen, or ketoprofen unless instructed by your doctor. These medicines may hide a fever. Do not become pregnant while taking this medicine. Women should inform their doctor if they wish to become pregnant or think they might be pregnant. There is a potential for serious side effects to an unborn child. Talk to your health care professional or pharmacist for more information. Do not breast-feed an infant while taking this medicine. Men are advised not to father a child while receiving this medicine. This product may contain alcohol. Ask your pharmacist or healthcare provider if this medicine contains alcohol. Be sure to tell all healthcare providers you are  taking this medicine. Certain medicines, like metronidazole and disulfiram, can cause an unpleasant reaction when taken with alcohol. The reaction includes flushing, headache, nausea, vomiting, sweating, and increased thirst. The reaction can last from 30 minutes to several hours. What side effects may I notice from receiving this medicine? Side effects that you should report to your doctor or health care professional as soon as possible: -allergic reactions like skin rash, itching or hives, swelling of the face, lips, or tongue -low blood counts - This drug may decrease the number of white blood cells, red blood cells and platelets. You may be at increased risk for infections and bleeding. -signs of infection - fever or chills, cough, sore throat, pain or difficulty passing urine -signs of decreased platelets or bleeding - bruising, pinpoint red spots on the skin, black, tarry stools, nosebleeds -signs of decreased red blood cells - unusually weak or tired, fainting spells, lightheadedness -breathing problems -chest pain -high or low blood pressure -mouth sores -nausea and vomiting -pain, swelling, redness or irritation at the injection site -pain, tingling, numbness in the hands or feet -slow or irregular heartbeat -swelling of the ankle, feet, hands Side effects that usually do not require medical attention (report to your doctor or health care professional if they continue or are bothersome): -bone pain -complete hair loss including hair on your head, underarms, pubic hair, eyebrows, and eyelashes -changes in the color of fingernails -diarrhea -loosening of the fingernails -loss of appetite -muscle or joint pain -red flush to skin -sweating This list may not describe all possible side effects. Call your doctor for medical  advice about side effects. You may report side effects to FDA at 1-800-FDA-1088. Where should I keep my medicine? This drug is given in a hospital or clinic and  will not be stored at home. NOTE: This sheet is a summary. It may not cover all possible information. If you have questions about this medicine, talk to your doctor, pharmacist, or health care provider.  2018 [employer] Standard (2014-12-26 19:58:00)   Carboplatin injection What is this medicine? CARBOPLATIN (KAR boe pla tin) is a chemotherapy drug. It targets fast dividing cells, like cancer cells, and causes these cells to die. This medicine is used to treat ovarian cancer and many other cancers. This medicine may be used for other purposes; ask your health care provider or pharmacist if you have questions. COMMON BRAND NAME(S): Paraplatin What should I tell my health care provider before I take this medicine? They need to know if you have any of these conditions: -blood disorders -hearing problems -kidney disease -recent or ongoing radiation therapy -an unusual or allergic reaction to carboplatin, cisplatin, other chemotherapy, other medicines, foods, dyes, or preservatives -pregnant or trying to get pregnant -breast-feeding How should I use this medicine? This drug is usually given as an infusion into a vein. It is administered in a hospital or clinic by a specially trained health care professional. Talk to your pediatrician regarding the use of this medicine in children. Special care may be needed. Overdosage: If you think you have taken too much of this medicine contact a poison control center or emergency room at once. NOTE: This medicine is only for you. Do not share this medicine with others. What if I miss a dose? It is important not to miss a dose. Call your doctor or health care professional if you are unable to keep an appointment. What may interact with this medicine? -medicines for seizures -medicines to increase blood counts like filgrastim, pegfilgrastim, sargramostim -some antibiotics like amikacin, gentamicin, neomycin, streptomycin, tobramycin -vaccines Talk to  your doctor or health care professional before taking any of these medicines: -acetaminophen -aspirin -ibuprofen -ketoprofen -naproxen This list may not describe all possible interactions. Give your health care provider a list of all the medicines, herbs, non-prescription drugs, or dietary supplements you use. Also tell them if you smoke, drink alcohol, or use illegal drugs. Some items may interact with your medicine. What should I watch for while using this medicine? Your condition will be monitored carefully while you are receiving this medicine. You will need important blood work done while you are taking this medicine. This drug may make you feel generally unwell. This is not uncommon, as chemotherapy can affect healthy cells as well as cancer cells. Report any side effects. Continue your course of treatment even though you feel ill unless your doctor tells you to stop. In some cases, you may be given additional medicines to help with side effects. Follow all directions for their use. Call your doctor or health care professional for advice if you get a fever, chills or sore throat, or other symptoms of a cold or flu. Do not treat yourself. This drug decreases your body's ability to fight infections. Try to avoid being around people who are sick. This medicine may increase your risk to bruise or bleed. Call your doctor or health care professional if you notice any unusual bleeding. Be careful brushing and flossing your teeth or using a toothpick because you may get an infection or bleed more easily. If you have any dental work done, tell  your dentist you are receiving this medicine. Avoid taking products that contain aspirin, acetaminophen, ibuprofen, naproxen, or ketoprofen unless instructed by your doctor. These medicines may hide a fever. Do not become pregnant while taking this medicine. Women should inform their doctor if they wish to become pregnant or think they might be pregnant. There is a  potential for serious side effects to an unborn child. Talk to your health care professional or pharmacist for more information. Do not breast-feed an infant while taking this medicine. What side effects may I notice from receiving this medicine? Side effects that you should report to your doctor or health care professional as soon as possible: -allergic reactions like skin rash, itching or hives, swelling of the face, lips, or tongue -signs of infection - fever or chills, cough, sore throat, pain or difficulty passing urine -signs of decreased platelets or bleeding - bruising, pinpoint red spots on the skin, black, tarry stools, nosebleeds -signs of decreased red blood cells - unusually weak or tired, fainting spells, lightheadedness -breathing problems -changes in hearing -changes in vision -chest pain -high blood pressure -low blood counts - This drug may decrease the number of white blood cells, red blood cells and platelets. You may be at increased risk for infections and bleeding. -nausea and vomiting -pain, swelling, redness or irritation at the injection site -pain, tingling, numbness in the hands or feet -problems with balance, talking, walking -trouble passing urine or change in the amount of urine Side effects that usually do not require medical attention (report to your doctor or health care professional if they continue or are bothersome): -hair loss -loss of appetite -metallic taste in the mouth or changes in taste This list may not describe all possible side effects. Call your doctor for medical advice about side effects. You may report side effects to FDA at 1-800-FDA-1088. Where should I keep my medicine? This drug is given in a hospital or clinic and will not be stored at home. NOTE: This sheet is a summary. It may not cover all possible information. If you have questions about this medicine, talk to your doctor, pharmacist, or health care provider.  2018 [employer]  Standard (2007-06-01 14:38:05)

## 2017-07-14 NOTE — Progress Notes (Signed)
HEMATOLOGY/ONCOLOGY CLINIC NOTE  Date of Service: 07/14/2017   Patient Care Team: Kayla Frees, MD as PCP - General (Family Medicine)  CHIEF COMPLAINTS/PURPOSE OF CONSULTATION:  F/u for continued management of recently diagnosed Non-Small Cell Carcinoma  HISTORY OF PRESENTING ILLNESS:   Kayla Wyatt is a wonderful 62 y.o. female who has been referred to [place] by Dr Kayla Wyatt for evaluation and management of Non-Small Cell Carcinoma. She is accompanied today by her husband. The pt reports that she is doing well overall.   Patient recently had a significant hospitalization on 04/02/2017 - as per DC summary by Kayla Wyatt " with abdominal pain, hematemesis, melena and ABLA due to GIB.  She underwent clipping of gastric ulcer and received multiple units PRBC but continued to have drop in H/H with bloody stool. NM GI scan was negative for bleeding and she underwent colonoscopy by Dr. Benson Wyatt 1/31 showing diverticulosis but no signs of bleeding. Hospital course significant for R- gastrocnemius DVT, PAF, incidental findings of mediastinal mass as well as brief episode of hematuria. She was cleared to start on Xarelto on 02/01 for treatment of DVT. On 2/2 am she developed hematemesis with maroon stools and hypotension requiring fluid bolus as well as 2 units PRBCs. She continued to decline requiring intubation, pressors as well as reversal of anticoagulation with Kayla Wyatt on 02/3. She underwent UGI revealing large clot with fresh blood in stomach and underwent mesenteric arteriogram with percutaneous coil embolization fo distal tributary of left gastric artery and placement of IVC filter by interventional radiology.   A fib with RVR felt to be due to hemorrhagic shock and she converted to NSR. Kayla Wyatt felt no further cardiac work up indicated and did not recommend initiating anticoagulation in short term. To follow up with Kayla Wyatt after PET scan and routine biopsy after medically stable.  She continued to have bleeding and underwent visceral angiogram with embolization of inferior division of splenic artery and associated gastric arteries on 2/4. No surgical intervention needed per Kayla Wyatt with recommendations to continue monitoring H/H as well as for recurrence of hematochezia. She tolerated extubation on 2/6 and respiratory status improving.  On 2/11, she has episode of unresponsiveness with jerking of bilateral limbs that lasted about 3 minutes. She had amnesia of events but was back to baseline. EEG revealed "focal slowing over the right temporo-occipital regionandoccasional epileptiform discharges over the right occipital region". Head CT reviewed, unremarkable for acute intracranial process. MRI brain done revealing mild biparietal signal abnormality suspicious for PRES. Kayla Wyatt recommended starting patient on Kayla Wyatt BID with repeat MRI in 2 weeks. New onset seizure likely provoked by PRES--if resolved--Ok to take patient off Kayla Wyatt. Patient with resultant generalized weakness. Kayla Wyatt recommended due to functional deficits. "   Later as outpatient she had a PET scan on 05/14/17 which showed a hypermetabolic mediastinal mass which was subsequently biopsied with a bronchoscopy on 05/19/17. She notes that she had smoked for about 35-40 years, half a pack of a day. She reports that she has not had any other symptoms related to her smoking; she has never had to be on home oxygen or inhalers. She has recently begun Symbicort. While being in the hospital she lost about 20 lbs but has maintained her weight since being discharged.  The pt and her husband note that she is concerned about what to do if she is not able to go back to work after her 90-day leave. We recommended that they speak with our social  workers here to explore the available options for insurance coverage Kayla Wyatt.  Of note prior to the patient's visit today, pt has had PET/CT completed on 05/14/17  with results revealing 1. The known mediastinal mass is intensely hypermetabolic, worrisome for malignancy. This could reflect lymphoma or small cell lung cancer. Tissue sampling recommended.2. No peripheral lung mass identified. New patchy ground-glass opacity in the lingula is mildly hypermetabolic and likely inflammatory.  On 05/19/17 the pt had a Fine Needle Aspiration which revealed Non-Small cell lung carcinoma.  Most recent lab results (05/19/17) of CBC  is as follows: all values are WNL except for RBC at 2.90, Hgb at 8.6, HCT at 28.0, RDW at 16.4.  On review of systems, pt reports knee pain, and denies abdominal pains, leg swelling, SOB, CP, and any other symptoms.   On PMHx the pt reports acute blood loss anemia, GI hemorrhage with melena, hypercholesteremia, HTN, persistent atrial fibrillation with rapid ventricular response, Vitamin D deficiency, denies liver and kidney problems. On Social Hx the pt reports having smoked for 35-40 years of half a pack per day. She quit smoking on 03/31/17.  On Family Hx the pt reports that her father had heart disease.  Interval History:   [name] returns today regarding her Non-Small Cell Carcinoma. She presents to the clinic today accompanied by her husband. He had thrush after his first cycle CT and had lowered magnesium. She was given oral magnesium. She notes she has diarrhea from this. She notes she tolerated chemotherapy overall well. Her job plans to pay for her insurance through the end of this year which she notes has been a relief for her. Kayla Wyatt has resolved with treatment.  On review of symptoms, pt denies concerns with her breathing. Her thrush is resolved. She is doing well overall.   MEDICAL HISTORY:  Past Medical History:  Diagnosis Date  . Acute blood loss anemia 04/02/2017  . Anxiety   . Gastrointestinal hemorrhage with melena   . Headache   . Hypercholesteremia   . Hypertension   . Persistent atrial fibrillation with  rapid ventricular response (Kayla Wyatt) 04/07/2017  . Seizures (Kayla Wyatt)    04/2017 while in hospital at [facility]  . Vitamin D deficiency     SURGICAL HISTORY: Past Surgical History:  Procedure Laterality Date  . COLONOSCOPY Left 04/09/2017   Procedure: COLONOSCOPY;  Surgeon: Carol Ada, MD;  Location: [place];  Service: Endoscopy;  Laterality: Left;  . ESOPHAGOGASTRODUODENOSCOPY N/A 04/03/2017   Procedure: ESOPHAGOGASTRODUODENOSCOPY (EGD);  Surgeon: Carol Ada, MD;  Location: [place];  Service: Endoscopy;  Laterality: N/A;  . ESOPHAGOGASTRODUODENOSCOPY (EGD) WITH PROPOFOL N/A 04/12/2017   Procedure: ESOPHAGOGASTRODUODENOSCOPY (EGD) WITH PROPOFOL;  Surgeon: Milus Banister, MD;  Location: [facility] ENDOSCOPY;  Service: Endoscopy;  Laterality: N/A;  . IR ANGIOGRAM SELECTIVE EACH ADDITIONAL VESSEL  04/12/2017  . IR ANGIOGRAM SELECTIVE EACH ADDITIONAL VESSEL  04/13/2017  . IR ANGIOGRAM SELECTIVE EACH ADDITIONAL VESSEL  04/13/2017  . IR ANGIOGRAM SELECTIVE EACH ADDITIONAL VESSEL  04/13/2017  . IR ANGIOGRAM VISCERAL SELECTIVE  04/12/2017  . IR ANGIOGRAM VISCERAL SELECTIVE  04/13/2017  . IR EMBO ART  VEN HEMORR LYMPH EXTRAV  INC GUIDE ROADMAPPING  04/12/2017  . IR EMBO ART  VEN HEMORR LYMPH EXTRAV  INC GUIDE ROADMAPPING  04/13/2017  . IR FLUORO GUIDE CV LINE RIGHT  04/13/2017  . IR IVC FILTER PLMT / S&I /IMG GUID/MOD SED  04/12/2017  . IR US GUIDE VASC ACCESS RIGHT  04/12/2017  . IR  US GUIDE VASC ACCESS RIGHT  04/13/2017  . IR US GUIDE VASC ACCESS RIGHT  04/13/2017  . LEG SURGERY  1974   Blood Clot Removal   . VIDEO BRONCHOSCOPY WITH ENDOBRONCHIAL ULTRASOUND N/A 05/19/2017   Procedure: VIDEO BRONCHOSCOPY WITH ENDOBRONCHIAL ULTRASOUND;  Surgeon: Grace Isaac, MD;  Location: [place];  Service: Thoracic;  Laterality: N/A;    SOCIAL HISTORY: Social History   Socioeconomic History  . Marital status: Single    Spouse name: Not on file  . Number of children: Not on file  . Years of education: Not on file  . Highest  education level: Not on file  Occupational History  . Occupation: Has to lift heavy boxes at times.    Employer: [employer]  Social Needs  . Financial resource strain: Not on file  . Food insecurity:    Worry: Not on file    Inability: Not on file  . Transportation needs:    Medical: Not on file    Non-medical: Not on file  Tobacco Use  . Smoking status: Former Smoker    Packs/day: 0.50    Years: 40.00    Pack years: 20.00    Types: Cigarettes    Last attempt to quit: 03/31/2017    Years since quitting: 0.2  . Smokeless tobacco: Never Used  Substance and Sexual Activity  . Alcohol use: No    Frequency: Never  . Drug use: No  . Sexual activity: Not Currently  Lifestyle  . Physical activity:    Days per week: Not on file    Minutes per session: Not on file  . Stress: Not on file  Relationships  . Social connections:    Talks on phone: Not on file    Gets together: Not on file    Attends religious service: Not on file    Active member of club or organization: Not on file    Attends meetings of clubs or organizations: Not on file    Relationship status: Not on file  . Intimate partner violence:    Fear of current or ex partner: Not on file    Emotionally abused: Not on file    Physically abused: Not on file    Forced sexual activity: Not on file  Other Topics Concern  . Not on file  Social History Narrative  . Not on file    FAMILY HISTORY: Family History  Problem Relation Age of Onset  . Heart disease Father     ALLERGIES:  has No Known Allergies.  MEDICATIONS:  Current Outpatient Medications  Medication Sig Dispense Refill  . acetaminophen (TYLENOL) 325 MG tablet Take 1-2 tablets (325-650 mg total) by mouth every 4 (four) hours as needed for mild pain.    [name] alum & mag hydroxide-simeth (MAALOX MULTI SYMPTOM MAX ST) 400-400-40 MG/5ML suspension Take 10 mLs by mouth every 6 (six) hours as needed for indigestion. 355 mL 0  . budesonide-formoterol (SYMBICORT)  80-4.5 MCG/ACT inhaler Inhale 2 puffs into the lungs 2 (two) times daily. 1 Inhaler 12  . cholecalciferol (VITAMIN D) 1000 units tablet Take 1,000 Units by mouth 2 (two) times daily.    . collagenase (SANTYL) ointment Apply topically daily. To sacral area with damp to dry dressing. Change daily 15 g 0  . dexamethasone (DECADRON) 4 MG tablet Take 2 tablets (8 mg total) by mouth daily. Start the day after chemotherapy for 2 days. 30 tablet 1  . levETIRAcetam (Kayla Wyatt) 500 MG tablet TK 1  T PO BID  6  . magnesium oxide (MAG-OX) 400 (241.3 Mg) MG tablet Take 0.5 tablets (200 mg total) by mouth 2 (two) times daily. 60 tablet 0  . ondansetron (ZOFRAN) 8 MG tablet Take 1 tablet (8 mg total) by mouth 2 (two) times daily as needed for refractory nausea / vomiting. Start on day 3 after chemo. 30 tablet 1  . pantoprazole (PROTONIX) 40 MG tablet TK 1 T PO BID  6  . polycarbophil (FIBERCON) 625 MG tablet Take 2 tablets (1,250 mg total) by mouth 2 (two) times daily. 120 tablet 0  . potassium chloride SA (K-DUR,KLOR-CON) 20 MEQ tablet Take 1 tablet (20 mEq total) by mouth 2 (two) times daily. 60 tablet 0  . prochlorperazine (COMPAZINE) 10 MG tablet Take 1 tablet (10 mg total) by mouth every 6 (six) hours as needed (Nausea or vomiting). 30 tablet 1  . saccharomyces boulardii (FLORASTOR) 250 MG capsule Take 1 capsule (250 mg total) by mouth 2 (two) times daily. 100 capsule 0   No current facility-administered medications for this visit.     REVIEW OF SYSTEMS:   .10 Point review of Systems was done is negative except as noted above.  PHYSICAL EXAMINATION: ECOG PERFORMANCE STATUS: 1 - Symptomatic but completely ambulatory  . Vitals:   07/14/17 0931  BP: (!) 142/51  Pulse: 68  Resp: 17  Temp: 97.7 F (36.5 C)  SpO2: 96%   Filed Weights   07/14/17 0931  Weight: 128 lb 1.6 oz (58.1 kg)   .Body mass index is 22.69 kg/m. [name] GENERAL:alert, in no acute distress and comfortable SKIN: no acute rashes, no  significant lesions EYES: conjunctiva are pink and non-injected, sclera anicteric OROPHARYNX: MMM, no exudates, no oropharyngeal erythema or ulceration, no thrush. NECK: supple, no JVD LYMPH:  no palpable lymphadenopathy in the cervical, axillary or inguinal regions LUNGS: clear to auscultation b/l with normal respiratory effort HEART: regular rate & rhythm ABDOMEN:  normoactive bowel sounds , non tender, not distended. Extremity: no pedal edema PSYCH: alert & oriented x 3 with fluent speech NEURO: no focal motor/sensory deficits  LABORATORY DATA:  I have reviewed the data as listed  . CBC Latest Ref Rng & Units 07/14/2017 06/30/2017 06/23/2017  WBC 3.9 - 10.3 K/uL 7.1 16.0(H) 5.8  Hemoglobin 11.6 - 15.9 g/dL 13.1 11.7 12.1  Hematocrit 34.8 - 46.6 % 40.9 36.5 37.4  Platelets 145 - 400 K/uL 204 131(L) 200    . CMP Latest Ref Rng & Units 07/14/2017 06/30/2017 06/23/2017  Glucose 70 - 140 mg/dL 80 90 87  BUN 7 - 26 mg/dL '15 18 11  '$ Creatinine 0.60 - 1.10 mg/dL 0.73 0.77 0.71  Sodium 136 - 145 mmol/L 143 142 142  Potassium 3.5 - 5.1 mmol/L 3.5 4.5 3.4(L)  Chloride 98 - 109 mmol/L 110(H) 107 109  CO2 22 - 29 mmol/L '24 26 24  '$ Calcium 8.4 - 10.4 mg/dL 10.2 9.6 9.9  Total Protein 6.4 - 8.3 g/dL 7.6 6.3(L) 7.1  Total Bilirubin 0.2 - 1.2 mg/dL 0.2 <0.2(L) 0.2  Alkaline Phos 40 - 150 U/L 82 101 79  AST 5 - 34 U/L '16 15 13  '$ ALT 0 - 55 U/L '10 13 8   '$ 05/19/17 Fine Needle Aspiration:  05/19/17 Bronchial Washing Specimen B:   RADIOGRAPHIC STUDIES: I have personally reviewed the radiological images as listed and agreed with the findings in the report. No results found. MRI brain 04/21/2017: IMPRESSION: 1. Motion degraded examination. 2. Mild biparietal signal  abnormality suspicious for posterior reversible encephalopathic syndrome. 3. Otherwise negative MRI of the head with and without contrast for age.  Electronically Signed   By: Elon Alas M.D.   On: 04/22/2017  00:34    ASSESSMENT & PLAN:   62 y.o. female with  1. Newly diagnosed Non-Small Cell Carcinoma on bronchoscopy/cytology. Not enough tissue for further characterization or mutation testing. Presenting with mediastinal nodal mass - TxN2 Mx (Atleast Stage III disease) ? Lingular primary vs inflammation. No overt evidence of metastatic disease.  -EGFR mutation studies on blood negative.  -Carboplatin/Taxol with G-CSF support to starting 06/23/17 (will be q3weeks x 3 cycles) followed by RT  2. Bone Pain from neulasta - managed with Tylenol  3. Recent SZ -currently on Kayla Wyatt  4. Recent rt calf DVT - off anticoagulation due to massive GI bleeding. S/p IVC filter.  5. Recent GI bleeding from Gastric ulcer --needing clipping and arterial embolization. hgb normal at 13.1 today. On PPI BID  6. Low Magnesium -- today at 1.6 (07/14/17) -On oral magnesium '200mg'$  BID   PLAN  -Pt started carboplatin/paclitaxel on 06/23/17 and tolerated well. She had some bone pain after neulasta and managed with tylenol.  -She also developed thrush and drop in magnesium. Dr. Lebron Conners treated her and thrush is resolved. She started on oral magnesium. She will continue.  -I recommend slow mag to reduce her diarrhea. I will fill today -I discussed the option of PAC placement. Given her current treatment plan this is at her discretion. I reviewed the risk and benefits with pt. Pt opted for PAC placement. Will refer to IR -Labs reviewed and stable, overall adequate to proceed with cycle CT today  -Will do PET/CT scan after cycle 3 treatment.  -Previously discussed that our recommendation is 2 months of chemotherapy, evaluate disease stability, and radiate specific spots following this. May consider her options for adjuvant durvalumab post RT.    Fill Slow Mag today  IR referral for port a cath placement in 1 week Please schedule C3 of chemotherapy with neulasta RTC with Dr Irene Limbo in 3 weeks with labs with C3D1 of  treatment   All of the patients questions were answered with apparent satisfaction. The patient knows to call the clinic with any problems, questions or concerns.  . The total time spent in the appointment was 25 minutes and more than 50% was on counseling and direct patient cares.     Sullivan Lone MD [place] AAHIVMS [facility] Hematology/Oncology Physician [facility]  (Office):       [phone] (Work cell):  [phone] (Fax):           [phone]  07/14/2017 10:56 AM  This document serves as a record of services personally performed by Sullivan Lone, MD. It was created on his behalf by Joslyn Devon, a trained medical scribe. The creation of this record is based on the scribe's personal observations and the provider's statements to them.    .I have reviewed the above documentation for accuracy and completeness, and I agree with the above.  Brunetta Genera MD MS

## 2017-07-14 NOTE — Patient Instructions (Signed)
Thank you for choosing [facility] to provide your oncology and hematology care.  To afford each patient quality time with our providers, please arrive 30 minutes before your scheduled appointment time.  If you arrive late for your appointment, you may be asked to reschedule.  We strive to give you quality time with our providers, and arriving late affects you and other patients whose appointments are after yours.   If you are a no show for multiple scheduled visits, you may be dismissed from the clinic at the providers discretion.    Again, thank you for choosing [facility], our hope is that these requests will decrease the amount of time that you wait before being seen by our physicians.  ______________________________________________________________________  Should you have questions after your visit to the [facility], please contact our office at [phone] between the hours of 8:30 and 4:30 p.m.    Voicemails left after 4:30p.m will not be returned until the following business day.    For prescription refill requests, please have your pharmacy contact us directly.  Please also try to allow 48 hours for prescription requests.    Please contact the scheduling department for questions regarding scheduling.  For scheduling of procedures such as PET scans, CT scans, MRI, Ultrasound, etc please contact central scheduling at [phone].    Resources For Cancer Patients and Caregivers:   Oncolink.org:  A wonderful resource for patients and healthcare providers for information regarding your disease, ways to tract your treatment, what to expect, etc.     American Cancer Society:  800-227-2345  Can help patients locate various types of support and financial assistance  Cancer Care: 1-800-813-HOPE (4673) Provides financial assistance, online support groups, medication/co-pay assistance.    [place] DSS:  336-641-3447 Where to apply for food  stamps, Medicaid, and utility assistance  Medicare Rights Center: 800-333-4114 Helps people with Medicare understand their rights and benefits, navigate the Medicare system, and secure the quality healthcare they deserve  [employer]: 336-333-6589  [employer]'s shared-ride transportation service for eligible riders who have a disability that prevents them from riding the fixed route bus.    For additional information on assistance programs please contact our social worker:   Grier Hock/Abigail Elmore:  [phone]            

## 2017-07-16 ENCOUNTER — Inpatient Hospital Stay: Payer: BLUE CROSS/BLUE SHIELD

## 2017-07-16 VITALS — BP 149/49 | HR 55 | Temp 98.0°F

## 2017-07-16 DIAGNOSIS — Z7189 Other specified counseling: Secondary | ICD-10-CM

## 2017-07-16 DIAGNOSIS — C3490 Malignant neoplasm of unspecified part of unspecified bronchus or lung: Secondary | ICD-10-CM | POA: Diagnosis not present

## 2017-07-16 MED ORDER — PEGFILGRASTIM-CBQV 6 MG/0.6ML ~~LOC~~ SOSY
6.0000 mg | PREFILLED_SYRINGE | Freq: Once | SUBCUTANEOUS | Status: AC
Start: 2017-07-16 — End: 2017-07-16
  Administered 2017-07-16: 6 mg via SUBCUTANEOUS

## 2017-07-24 ENCOUNTER — Other Ambulatory Visit: Payer: Self-pay | Admitting: Radiology

## 2017-07-24 MED ORDER — MAGNESIUM OXIDE 400 (241.3 MG) MG PO TABS: 200.0000 mg | ORAL_TABLET | Freq: Two times a day (BID) | ORAL | 0 refills | Status: DC

## 2017-07-24 MED ORDER — MAGNESIUM CHLORIDE 64 MG PO TBEC: 2.0000 | DELAYED_RELEASE_TABLET | Freq: Every day | ORAL | 1 refills | Status: DC

## 2017-07-27 ENCOUNTER — Encounter (HOSPITAL_COMMUNITY): Payer: Self-pay

## 2017-07-27 ENCOUNTER — Ambulatory Visit (HOSPITAL_COMMUNITY)
Admission: RE | Admit: 2017-07-27 | Discharge: 2017-07-27 | Disposition: A | Discharge status: Home / Self Care | Payer: BLUE CROSS/BLUE SHIELD | Source: Ambulatory Visit | Attending: Hematology | Admitting: Hematology

## 2017-07-27 ENCOUNTER — Other Ambulatory Visit: Payer: Self-pay | Admitting: Hematology

## 2017-07-27 DIAGNOSIS — Z8249 Family history of ischemic heart disease and other diseases of the circulatory system: Secondary | ICD-10-CM | POA: Insufficient documentation

## 2017-07-27 DIAGNOSIS — Z7951 Long term (current) use of inhaled steroids: Secondary | ICD-10-CM | POA: Diagnosis not present

## 2017-07-27 DIAGNOSIS — E559 Vitamin D deficiency, unspecified: Secondary | ICD-10-CM | POA: Insufficient documentation

## 2017-07-27 DIAGNOSIS — C3491 Malignant neoplasm of unspecified part of right bronchus or lung: Secondary | ICD-10-CM

## 2017-07-27 DIAGNOSIS — E7800 Pure hypercholesterolemia, unspecified: Secondary | ICD-10-CM | POA: Diagnosis not present

## 2017-07-27 DIAGNOSIS — Z9889 Other specified postprocedural states: Secondary | ICD-10-CM | POA: Insufficient documentation

## 2017-07-27 DIAGNOSIS — I10 Essential (primary) hypertension: Secondary | ICD-10-CM | POA: Diagnosis not present

## 2017-07-27 DIAGNOSIS — D72829 Elevated white blood cell count, unspecified: Secondary | ICD-10-CM | POA: Insufficient documentation

## 2017-07-27 DIAGNOSIS — I481 Persistent atrial fibrillation: Secondary | ICD-10-CM | POA: Insufficient documentation

## 2017-07-27 DIAGNOSIS — Z87891 Personal history of nicotine dependence: Secondary | ICD-10-CM | POA: Diagnosis not present

## 2017-07-27 DIAGNOSIS — Z79899 Other long term (current) drug therapy: Secondary | ICD-10-CM | POA: Insufficient documentation

## 2017-07-27 HISTORY — PX: IR FLUORO GUIDE PORT INSERTION RIGHT: IMG5741

## 2017-07-27 HISTORY — PX: IR US GUIDE VASC ACCESS RIGHT: IMG2390

## 2017-07-27 LAB — PROTIME-INR
INR: 0.9
Prothrombin Time: 12.1 seconds (ref 11.4–15.2)

## 2017-07-27 LAB — CBC WITH DIFFERENTIAL/PLATELET
Basophils Absolute: 0 10*3/uL (ref 0.0–0.1)
Basophils Relative: 0 %
Eosinophils Absolute: 0 10*3/uL (ref 0.0–0.7)
Eosinophils Relative: 0 %
HCT: 34.9 % — ABNORMAL LOW (ref 36.0–46.0)
Hemoglobin: 11.3 g/dL — ABNORMAL LOW (ref 12.0–15.0)
Lymphocytes Relative: 20 %
Lymphs Abs: 2.5 10*3/uL (ref 0.7–4.0)
MCH: 28.7 pg (ref 26.0–34.0)
MCHC: 32.4 g/dL (ref 30.0–36.0)
MCV: 88.6 fL (ref 78.0–100.0)
Monocytes Absolute: 0.7 10*3/uL (ref 0.1–1.0)
Monocytes Relative: 6 %
Neutro Abs: 9.2 10*3/uL — ABNORMAL HIGH (ref 1.7–7.7)
Neutrophils Relative %: 74 %
Platelets: 145 10*3/uL — ABNORMAL LOW (ref 150–400)
RBC: 3.94 MIL/uL (ref 3.87–5.11)
RDW: 16.2 % — ABNORMAL HIGH (ref 11.5–15.5)
WBC: 12.4 10*3/uL — ABNORMAL HIGH (ref 4.0–10.5)

## 2017-07-27 MED ORDER — CEFAZOLIN SODIUM-DEXTROSE 2-4 GM/100ML-% IV SOLN
2.0000 g | INTRAVENOUS | Status: AC
  Administered 2017-07-27: 2 g via INTRAVENOUS

## 2017-07-27 MED ORDER — MIDAZOLAM HCL 2 MG/2ML IJ SOLN
INTRAMUSCULAR | Status: AC
  Filled 2017-07-27: qty 4

## 2017-07-27 MED ORDER — MIDAZOLAM HCL 2 MG/2ML IJ SOLN
INTRAMUSCULAR | Status: AC | PRN
  Administered 2017-07-27 (×3): 1 mg via INTRAVENOUS

## 2017-07-27 MED ORDER — LIDOCAINE-EPINEPHRINE (PF) 2 %-1:200000 IJ SOLN
INTRAMUSCULAR | Status: AC | PRN
  Administered 2017-07-27: 20 mL

## 2017-07-27 MED ORDER — FENTANYL CITRATE (PF) 100 MCG/2ML IJ SOLN
INTRAMUSCULAR | Status: AC
  Filled 2017-07-27: qty 4

## 2017-07-27 MED ORDER — SODIUM CHLORIDE 0.9 % IV SOLN
INTRAVENOUS | Status: DC
  Administered 2017-07-27: 12:00:00 via INTRAVENOUS

## 2017-07-27 MED ORDER — CEFAZOLIN SODIUM-DEXTROSE 2-4 GM/100ML-% IV SOLN
INTRAVENOUS | Status: AC
  Administered 2017-07-27: 2 g via INTRAVENOUS
  Filled 2017-07-27: qty 100

## 2017-07-27 MED ORDER — LIDOCAINE-EPINEPHRINE (PF) 2 %-1:200000 IJ SOLN
INTRAMUSCULAR | Status: AC
  Filled 2017-07-27: qty 20

## 2017-07-27 MED ORDER — FENTANYL CITRATE (PF) 100 MCG/2ML IJ SOLN
INTRAMUSCULAR | Status: AC | PRN
  Administered 2017-07-27 (×3): 50 ug via INTRAVENOUS

## 2017-07-27 MED ORDER — HEPARIN SOD (PORK) LOCK FLUSH 100 UNIT/ML IV SOLN
INTRAVENOUS | Status: AC | PRN
  Administered 2017-07-27: 500 [IU] via INTRAVENOUS

## 2017-07-27 MED ORDER — HEPARIN SOD (PORK) LOCK FLUSH 100 UNIT/ML IV SOLN
INTRAVENOUS | Status: AC
  Filled 2017-07-27: qty 5

## 2017-07-27 NOTE — Sedation Documentation (Signed)
Patient is resting comfortably. Denies pain. Awake and alert. MD suturing port pocket closed at this time

## 2017-07-27 NOTE — H&P (Signed)
Chief Complaint: Patient was seen in consultation today for port placement at the request of Brunetta Genera  Referring Physician(s): Brunetta Genera  Supervising Physician: Sandi Mariscal  Patient Status: [facility] - Out-pt  History of Present Illness: Kayla Wyatt is a 62 y.o. female with lung cancer is referred for port palcement PMHx, meds, labs, imaging, allergies reviewed. Feels well, no recent fevers, chills, illness. Has been NPO today as directed. Family at bedside.   Past Medical History:  Diagnosis Date  . Acute blood loss anemia 04/02/2017  . Anxiety   . Gastrointestinal hemorrhage with melena   . Headache   . Hypercholesteremia   . Hypertension   . Persistent atrial fibrillation with rapid ventricular response ([place]) 04/07/2017  . Seizures ([place])    04/2017 while in hospital at [facility]  . Vitamin D deficiency     Past Surgical History:  Procedure Laterality Date  . COLONOSCOPY Left 04/09/2017   Procedure: COLONOSCOPY;  Surgeon: Carol Ada, MD;  Location: [place];  Service: Endoscopy;  Laterality: Left;  . ESOPHAGOGASTRODUODENOSCOPY N/A 04/03/2017   Procedure: ESOPHAGOGASTRODUODENOSCOPY (EGD);  Surgeon: Carol Ada, MD;  Location: [place];  Service: Endoscopy;  Laterality: N/A;  . ESOPHAGOGASTRODUODENOSCOPY (EGD) WITH PROPOFOL N/A 04/12/2017   Procedure: ESOPHAGOGASTRODUODENOSCOPY (EGD) WITH PROPOFOL;  Surgeon: Milus Banister, MD;  Location: [facility] ENDOSCOPY;  Service: Endoscopy;  Laterality: N/A;  . IR ANGIOGRAM SELECTIVE EACH ADDITIONAL VESSEL  04/12/2017  . IR ANGIOGRAM SELECTIVE EACH ADDITIONAL VESSEL  04/13/2017  . IR ANGIOGRAM SELECTIVE EACH ADDITIONAL VESSEL  04/13/2017  . IR ANGIOGRAM SELECTIVE EACH ADDITIONAL VESSEL  04/13/2017  . IR ANGIOGRAM VISCERAL SELECTIVE  04/12/2017  . IR ANGIOGRAM VISCERAL SELECTIVE  04/13/2017  . IR EMBO ART  VEN HEMORR LYMPH EXTRAV  INC GUIDE ROADMAPPING  04/12/2017  . IR EMBO ART  VEN HEMORR LYMPH EXTRAV  INC GUIDE  ROADMAPPING  04/13/2017  . IR FLUORO GUIDE CV LINE RIGHT  04/13/2017  . IR IVC FILTER PLMT / S&I /IMG GUID/MOD SED  04/12/2017  . IR US GUIDE VASC ACCESS RIGHT  04/12/2017  . IR US GUIDE VASC ACCESS RIGHT  04/13/2017  . IR US GUIDE VASC ACCESS RIGHT  04/13/2017  . LEG SURGERY  1974   Blood Clot Removal   . VIDEO BRONCHOSCOPY WITH ENDOBRONCHIAL ULTRASOUND N/A 05/19/2017   Procedure: VIDEO BRONCHOSCOPY WITH ENDOBRONCHIAL ULTRASOUND;  Surgeon: Grace Isaac, MD;  Location: [place];  Service: Thoracic;  Laterality: N/A;    Allergies: Patient has no known allergies.  Medications: Prior to Admission medications   Medication Sig Start Date End Date Taking? Authorizing Provider  acetaminophen (TYLENOL) 325 MG tablet Take 1-2 tablets (325-650 mg total) by mouth every 4 (four) hours as needed for mild pain. 04/30/17  Yes Love, Ivan Anchors, PA-C  alum & mag hydroxide-simeth (MAALOX MULTI SYMPTOM MAX ST) 400-400-40 MG/5ML suspension Take 10 mLs by mouth every 6 (six) hours as needed for indigestion. 06/30/17  Yes Ardath Sax, MD  budesonide-formoterol (SYMBICORT) 80-4.5 MCG/ACT inhaler Inhale 2 puffs into the lungs 2 (two) times daily. 04/30/17  Yes Love, Ivan Anchors, PA-C  cholecalciferol (VITAMIN D) 1000 units tablet Take 1,000 Units by mouth 2 (two) times daily.   Yes [provider]  levETIRAcetam (KEPPRA) 500 MG tablet TK 1 T PO BID 06/02/17  Yes [provider]  magnesium chloride (SLOW-MAG) 64 MG TBEC SR tablet Take 2 tablets (128 mg total) by mouth daily. 07/24/17  Yes Brunetta Genera, MD  pantoprazole ([place])  40 MG tablet TK 1 T PO BID 06/02/17  Yes [provider]  polycarbophil (FIBERCON) 625 MG tablet Take 2 tablets (1,250 mg total) by mouth 2 (two) times daily. 04/30/17  Yes Love, Ivan Anchors, PA-C  potassium chloride SA (K-DUR,KLOR-CON) 20 MEQ tablet Take 1 tablet (20 mEq total) by mouth 2 (two) times daily. 04/30/17  Yes Love, Ivan Anchors, PA-C  saccharomyces boulardii  (FLORASTOR) 250 MG capsule Take 1 capsule (250 mg total) by mouth 2 (two) times daily. 04/30/17  Yes Love, Ivan Anchors, PA-C  collagenase (SANTYL) ointment Apply topically daily. To sacral area with damp to dry dressing. Change daily 05/01/17   Love, Ivan Anchors, PA-C  dexamethasone (DECADRON) 4 MG tablet Take 2 tablets (8 mg total) by mouth daily. Start the day after chemotherapy for 2 days. 06/21/17   Brunetta Genera, MD  ondansetron (ZOFRAN) 8 MG tablet Take 1 tablet (8 mg total) by mouth 2 (two) times daily as needed for refractory nausea / vomiting. Start on day 3 after chemo. 06/21/17   Brunetta Genera, MD  prochlorperazine (COMPAZINE) 10 MG tablet Take 1 tablet (10 mg total) by mouth every 6 (six) hours as needed (Nausea or vomiting). 06/21/17   Brunetta Genera, MD     Family History  Problem Relation Age of Onset  . Heart disease Father     Social History   Socioeconomic History  . Marital status: Single    Spouse name: Not on file  . Number of children: Not on file  . Years of education: Not on file  . Highest education level: Not on file  Occupational History  . Occupation: Has to lift heavy boxes at times.    Employer: [employer]  Social Needs  . Financial resource strain: Not on file  . Food insecurity:    Worry: Not on file    Inability: Not on file  . Transportation needs:    Medical: Not on file    Non-medical: Not on file  Tobacco Use  . Smoking status: Former Smoker    Packs/day: 0.50    Years: 40.00    Pack years: 20.00    Types: Cigarettes    Last attempt to quit: 03/31/2017    Years since quitting: 0.3  . Smokeless tobacco: Never Used  Substance and Sexual Activity  . Alcohol use: No    Frequency: Never  . Drug use: No  . Sexual activity: Not Currently  Lifestyle  . Physical activity:    Days per week: Not on file    Minutes per session: Not on file  . Stress: Not on file  Relationships  . Social connections:    Talks on phone: Not on file     Gets together: Not on file    Attends religious service: Not on file    Active member of club or organization: Not on file    Attends meetings of clubs or organizations: Not on file    Relationship status: Not on file  Other Topics Concern  . Not on file  Social History Narrative  . Not on file     Review of Systems: A 12 point ROS discussed and pertinent positives are indicated in the HPI above.  All other systems are negative.  Review of Systems  Vital Signs: BP (!) 133/53   Pulse 63   Temp 98.2 F (36.8 C) (Oral)   Resp 18   Ht 5\' 3"  (1.6 m)   Wt 127 lb (  57.6 kg)   SpO2 95%   BMI 22.50 kg/m   Physical Exam  Constitutional: She is oriented to person, place, and time. She appears well-developed. No distress.  HENT:  Head: Normocephalic.  Mouth/Throat: Oropharynx is clear and moist.  Neck: Normal range of motion. No JVD present.  Cardiovascular: Normal rate, regular rhythm and normal heart sounds.  Pulmonary/Chest: Effort normal and breath sounds normal. No respiratory distress.  Neurological: She is alert and oriented to person, place, and time.  Skin: Skin is warm and dry.  Psychiatric: She has a normal mood and affect. Judgment normal.    Imaging: No results found.  Labs:  CBC: Recent Labs    06/23/17 0850 06/30/17 1306 07/14/17 0900 07/27/17 1149  WBC 5.8 16.0* 7.1 12.4*  HGB 12.1 11.7 13.1 11.3*  HCT 37.4 36.5 40.9 34.9*  PLT 200 131* 204 145*    COAGS: Recent Labs    04/02/17 1638 04/03/17 0409  04/14/17 0920 04/15/17 0755 05/19/17 1142 07/27/17 1149  INR  --  1.10   < > 1.27 1.19 0.97 0.90  APTT 27 25  --   --   --  28  --    < > = values in this interval not displayed.    BMP: Recent Labs    06/12/17 1032 06/23/17 0850 06/30/17 1306 07/14/17 0900  NA 140 142 142 143  K 3.8 3.4* 4.5 3.5  CL 105 109 107 110*  CO2 26 24 26 24   GLUCOSE 88 87 90 80  BUN 10 11 18 15   CALCIUM 10.1 9.9 9.6 10.2  CREATININE 0.71 0.71 0.77 0.73    GFRNONAA >60 >60 >60 >60  GFRAA >60 >60 >60 >60    LIVER FUNCTION TESTS: Recent Labs    06/12/17 1032 06/23/17 0850 06/30/17 1306 07/14/17 0900  BILITOT 0.4 0.2 <0.2* 0.2  AST 14 13 15 16   ALT 9 8 13 10   ALKPHOS 80 79 101 82  PROT 7.0 7.1 6.3* 7.6  ALBUMIN 3.4* 3.3* 3.4* 4.0    TUMOR MARKERS: No results for input(s): AFPTM, CEA, CA199, CHROMGRNA in the last 8760 hours.  Assessment and Plan: Lung cancer For port placement Labs reviewed, mild leukocytosis. Pt has been on po Decadron. Afebrile Risks and benefits of image guided port-a-catheter placement was discussed with the patient including, but not limited to bleeding, infection, pneumothorax, or fibrin sheath development and need for additional procedures.  All of the patient's questions were answered, patient is agreeable to proceed. Consent signed and in chart.    Thank you for this interesting consult.  I greatly enjoyed meeting Gabbie Marzo and look forward to participating in their care.  A copy of this report was sent to the requesting provider on this date.  Electronically Signed: Ascencion Dike, PA-C 07/27/2017, 1:10 PM   I spent a total of 20 minutes in face to face in clinical consultation, greater than 50% of which was counseling/coordinating care for port placement

## 2017-07-27 NOTE — Procedures (Signed)
Pre Procedure Dx: Lung Cancer Post Procedural Dx: Same  Successful placement of right IJ approach port-a-cath with tip at the superior caval atrial junction. The catheter is ready for immediate use.  Estimated Blood Loss: Minimal  Complications: None immediate.  Jay Commodore Bellew, MD Pager #: [phone]   

## 2017-07-27 NOTE — Discharge Instructions (Signed)
Implanted Port Insertion, Care After °This sheet gives you information about how to care for yourself after your procedure. Your health care provider may also give you more specific instructions. If you have problems or questions, contact your health care provider. °What can I expect after the procedure? °After your procedure, it is common to have: °· Discomfort at the port insertion site. °· Bruising on the skin over the port. This should improve over 3-4 days. ° °Follow these instructions at home: °Port care °· After your port is placed, you will get a manufacturer's information card. The card has information about your port. Keep this card with you at all times. °· Take care of the port as told by your health care provider. Ask your health care provider if you or a family member can get training for taking care of the port at home. A home health care nurse may also take care of the port. °· Make sure to remember what type of port you have. °Incision care °· Follow instructions from your health care provider about how to take care of your port insertion site. Make sure you: °? Wash your hands with soap and water before you change your bandage (dressing). If soap and water are not available, use hand sanitizer. °? Change your dressing as told by your health care provider. °? Leave stitches (sutures), skin glue, or adhesive strips in place. These skin closures may need to stay in place for 2 weeks or longer. If adhesive strip edges start to loosen and curl up, you may trim the loose edges. Do not remove adhesive strips completely unless your health care provider tells you to do that. °· Check your port insertion site every day for signs of infection. Check for: °? More redness, swelling, or pain. °? More fluid or blood. °? Warmth. °? Pus or a bad smell. °General instructions °· Do not take baths, swim, or use a hot tub until your health care provider approves. °· Do not lift anything that is heavier than 10 lb (4.5  kg) for a week, or as told by your health care provider. °· Ask your health care provider when it is okay to: °? Return to work or school. °? Resume usual physical activities or sports. °· Do not drive for 24 hours if you were given a medicine to help you relax (sedative). °· Take over-the-counter and prescription medicines only as told by your health care provider. °· Wear a medical alert bracelet in case of an emergency. This will tell any health care providers that you have a port. °· Keep all follow-up visits as told by your health care provider. This is important. °Contact a health care provider if: °· You cannot flush your port with saline as directed, or you cannot draw blood from the port. °· You have a fever or chills. °· You have more redness, swelling, or pain around your port insertion site. °· You have more fluid or blood coming from your port insertion site. °· Your port insertion site feels warm to the touch. °· You have pus or a bad smell coming from the port insertion site. °Get help right away if: °· You have chest pain or shortness of breath. °· You have bleeding from your port that you cannot control. °Summary °· Take care of the port as told by your health care provider. °· Change your dressing as told by your health care provider. °· Keep all follow-up visits as told by your health care provider. °  This information is not intended to replace advice given to you by your health care provider. Make sure you discuss any questions you have with your health care provider. °Document Released: 12/15/2012 Document Revised: 01/16/2016 Document Reviewed: 01/16/2016 °Elsevier Interactive Patient Education © 2017 [employer] °Moderate Conscious Sedation, Adult, Care After °These instructions provide you with information about caring for yourself after your procedure. Your health care provider may also give you more specific instructions. Your treatment has been planned according to current medical  practices, but problems sometimes occur. Call your health care provider if you have any problems or questions after your procedure. °What can I expect after the procedure? °After your procedure, it is common: °· To feel sleepy for several hours. °· To feel clumsy and have poor balance for several hours. °· To have poor judgment for several hours. °· To vomit if you eat too soon. ° °Follow these instructions at home: °For at least 24 hours after the procedure: ° °· Do not: °? Participate in activities where you could fall or become injured. °? Drive. °? Use heavy machinery. °? Drink alcohol. °? Take sleeping pills or medicines that cause drowsiness. °? Make important decisions or sign legal documents. °? Take care of children on your own. °· Rest. °Eating and drinking °· Follow the diet recommended by your health care provider. °· If you vomit: °? Drink water, juice, or soup when you can drink without vomiting. °? Make sure you have little or no nausea before eating solid foods. °General instructions °· Have a responsible adult stay with you until you are awake and alert. °· Take over-the-counter and prescription medicines only as told by your health care provider. °· If you smoke, do not smoke without supervision. °· Keep all follow-up visits as told by your health care provider. This is important. °Contact a health care provider if: °· You keep feeling nauseous or you keep vomiting. °· You feel light-headed. °· You develop a rash. °· You have a fever. °Get help right away if: °· You have trouble breathing. °This information is not intended to replace advice given to you by your health care provider. Make sure you discuss any questions you have with your health care provider. °Document Released: 12/15/2012 Document Revised: 07/30/2015 Document Reviewed: 06/16/2015 °Elsevier Interactive Patient Education © 2018 Elsevier Inc. ° °

## 2017-07-30 ENCOUNTER — Encounter: Payer: BLUE CROSS/BLUE SHIELD | Attending: Registered Nurse | Admitting: Physical Medicine & Rehabilitation

## 2017-07-30 ENCOUNTER — Encounter: Payer: Self-pay | Admitting: Physical Medicine & Rehabilitation

## 2017-07-30 VITALS — BP 129/55 | HR 67 | Ht 63.0 in | Wt 130.6 lb

## 2017-07-30 DIAGNOSIS — I82491 Acute embolism and thrombosis of other specified deep vein of right lower extremity: Secondary | ICD-10-CM | POA: Diagnosis not present

## 2017-07-30 DIAGNOSIS — R569 Unspecified convulsions: Secondary | ICD-10-CM | POA: Insufficient documentation

## 2017-07-30 DIAGNOSIS — F17200 Nicotine dependence, unspecified, uncomplicated: Secondary | ICD-10-CM | POA: Insufficient documentation

## 2017-07-30 DIAGNOSIS — E7800 Pure hypercholesterolemia, unspecified: Secondary | ICD-10-CM | POA: Insufficient documentation

## 2017-07-30 DIAGNOSIS — K259 Gastric ulcer, unspecified as acute or chronic, without hemorrhage or perforation: Secondary | ICD-10-CM | POA: Insufficient documentation

## 2017-07-30 DIAGNOSIS — I10 Essential (primary) hypertension: Secondary | ICD-10-CM | POA: Diagnosis not present

## 2017-07-30 DIAGNOSIS — I481 Persistent atrial fibrillation: Secondary | ICD-10-CM | POA: Diagnosis not present

## 2017-07-30 DIAGNOSIS — Z7689 Persons encountering health services in other specified circumstances: Secondary | ICD-10-CM | POA: Diagnosis not present

## 2017-07-30 DIAGNOSIS — C3490 Malignant neoplasm of unspecified part of unspecified bronchus or lung: Secondary | ICD-10-CM

## 2017-07-30 DIAGNOSIS — G7281 Critical illness myopathy: Secondary | ICD-10-CM | POA: Insufficient documentation

## 2017-07-30 DIAGNOSIS — Z9889 Other specified postprocedural states: Secondary | ICD-10-CM | POA: Diagnosis not present

## 2017-07-30 DIAGNOSIS — E559 Vitamin D deficiency, unspecified: Secondary | ICD-10-CM | POA: Insufficient documentation

## 2017-07-30 DIAGNOSIS — K921 Melena: Secondary | ICD-10-CM | POA: Diagnosis not present

## 2017-07-30 NOTE — Patient Instructions (Signed)
Please follow up for brain MRI  Please follow up with Gastroenterologist

## 2017-07-30 NOTE — Progress Notes (Signed)
Subjective:    Patient ID: Kayla Wyatt, female    DOB: 09-13-1955, 62 y.o.   MRN: [id number]  HPI 62 y.o. female with history of HTN presents for hospital follow up for PRES.     Last clinic visit 06/17/17. Since that time, she states she continues to HEP. She still has not followed up with GI. She has started Chemo.  She still has not had repeat MRI. Denies  Pain Inventory Average Pain 0 Pain Right Now 0 My pain is na  In the last 24 hours, has pain interfered with the following? General activity 0 Relation with others 0 Enjoyment of life 0 What TIME of day is your pain at its worst? na Sleep (in general) Good  Pain is worse with: nsa Pain improves with: na Relief from Meds: na  Mobility walk without assistance ability to climb steps?  yes do you drive?  no  Function employed # of hrs/week [employer] Do you have any goals in this area?  no  Neuro/Psych No problems in this area  Prior Studies Any changes since last visit?  no  Physicians involved in your care Any changes since last visit?  no   Family History  Problem Relation Age of Onset  . Heart disease Father    Social History   Socioeconomic History  . Marital status: Single    Spouse name: Not on file  . Number of children: Not on file  . Years of education: Not on file  . Highest education level: Not on file  Occupational History  . Occupation: Has to lift heavy boxes at times.    Employer: [employer]  Social Needs  . Financial resource strain: Not on file  . Food insecurity:    Worry: Not on file    Inability: Not on file  . Transportation needs:    Medical: Not on file    Non-medical: Not on file  Tobacco Use  . Smoking status: Former Smoker    Packs/day: 0.50    Years: 40.00    Pack years: 20.00    Types: Cigarettes    Last attempt to quit: 03/31/2017    Years since quitting: 0.3  . Smokeless tobacco: Never Used  Substance and Sexual Activity  . Alcohol use: No    Frequency: Never  .  Drug use: No  . Sexual activity: Not Currently  Lifestyle  . Physical activity:    Days per week: Not on file    Minutes per session: Not on file  . Stress: Not on file  Relationships  . Social connections:    Talks on phone: Not on file    Gets together: Not on file    Attends religious service: Not on file    Active member of club or organization: Not on file    Attends meetings of clubs or organizations: Not on file    Relationship status: Not on file  Other Topics Concern  . Not on file  Social History Narrative  . Not on file   Past Surgical History:  Procedure Laterality Date  . COLONOSCOPY Left 04/09/2017   Procedure: COLONOSCOPY;  Surgeon: Carol Ada, MD;  Location: [place];  Service: Endoscopy;  Laterality: Left;  . ESOPHAGOGASTRODUODENOSCOPY N/A 04/03/2017   Procedure: ESOPHAGOGASTRODUODENOSCOPY (EGD);  Surgeon: Carol Ada, MD;  Location: [place];  Service: Endoscopy;  Laterality: N/A;  . ESOPHAGOGASTRODUODENOSCOPY (EGD) WITH PROPOFOL N/A 04/12/2017   Procedure: ESOPHAGOGASTRODUODENOSCOPY (EGD) WITH PROPOFOL;  Surgeon: Milus Banister, MD;  Location: [facility] ENDOSCOPY;  Service: Endoscopy;  Laterality: N/A;  . IR ANGIOGRAM SELECTIVE EACH ADDITIONAL VESSEL  04/12/2017  . IR ANGIOGRAM SELECTIVE EACH ADDITIONAL VESSEL  04/13/2017  . IR ANGIOGRAM SELECTIVE EACH ADDITIONAL VESSEL  04/13/2017  . IR ANGIOGRAM SELECTIVE EACH ADDITIONAL VESSEL  04/13/2017  . IR ANGIOGRAM VISCERAL SELECTIVE  04/12/2017  . IR ANGIOGRAM VISCERAL SELECTIVE  04/13/2017  . IR EMBO ART  VEN HEMORR LYMPH EXTRAV  INC GUIDE ROADMAPPING  04/12/2017  . IR EMBO ART  VEN HEMORR LYMPH EXTRAV  INC GUIDE ROADMAPPING  04/13/2017  . IR FLUORO GUIDE CV LINE RIGHT  04/13/2017  . IR FLUORO GUIDE PORT INSERTION RIGHT  07/27/2017  . IR IVC FILTER PLMT / S&I /IMG GUID/MOD SED  04/12/2017  . IR US GUIDE VASC ACCESS RIGHT  04/12/2017  . IR US GUIDE VASC ACCESS RIGHT  04/13/2017  . IR US GUIDE VASC ACCESS RIGHT  04/13/2017  . IR US  GUIDE VASC ACCESS RIGHT  07/27/2017  . LEG SURGERY  1974   Blood Clot Removal   . VIDEO BRONCHOSCOPY WITH ENDOBRONCHIAL ULTRASOUND N/A 05/19/2017   Procedure: VIDEO BRONCHOSCOPY WITH ENDOBRONCHIAL ULTRASOUND;  Surgeon: Grace Isaac, MD;  Location: [place];  Service: Thoracic;  Laterality: N/A;   Past Medical History:  Diagnosis Date  . Acute blood loss anemia 04/02/2017  . Anxiety   . Gastrointestinal hemorrhage with melena   . Headache   . Hypercholesteremia   . Hypertension   . Persistent atrial fibrillation with rapid ventricular response ([place]) 04/07/2017  . Seizures ([place])    04/2017 while in hospital at [facility]  . Vitamin D deficiency    BP (!) 129/55   Pulse 67   Ht 5\' 3"  (1.6 m)   Wt 130 lb 9.6 oz (59.2 kg)   SpO2 96%   BMI 23.13 kg/m   Opioid Risk Score:   Fall Risk Score:  `1  Depression screen PHQ 2/9  Depression screen [facility] 2/9 06/09/2017 05/11/2017 05/11/2017  Decreased Interest 0 0 0  Down, Depressed, Hopeless 0 0 0  PHQ - 2 Score 0 0 0  Altered sleeping - 0 -  Tired, decreased energy - 1 -  Change in appetite - 0 -  Feeling bad or failure about yourself  - 0 -  Trouble concentrating - 0 -  Moving slowly or fidgety/restless - 0 -  Suicidal thoughts - 0 -  PHQ-9 Score - 1 -  Difficult doing work/chores - Not difficult at all -     Review of Systems  Constitutional: Negative.   HENT: Negative.   Eyes: Negative.   Respiratory: Negative.   Cardiovascular: Negative.   Gastrointestinal: Negative.   Endocrine: Negative.   Genitourinary: Negative.   Musculoskeletal: Negative.   Skin: Negative.   Allergic/Immunologic: Negative.   Neurological: Negative.   Hematological: Negative.   Psychiatric/Behavioral: Negative.   All other systems reviewed and are negative.     Objective:   Physical Exam Constitutional: She appears well-developed. No distress.  HENT: Normocephalic and atraumatic.  Eyes: EOM are normal. No discharge.  Cardiovascular: Irregularly  irregular. No JVD. Respiratory: clear. Unlabored. GI: BS +, non-tender  Musculoskeletal: She exhibits no edema or tenderness.  Neurological: She is alert and oriented.  HOH Motor: 4+-5/5 bilateral upper extremities (proximal RUE limited due to recent port placement) 4+/5 bilateral hip flexion, 4+-5/5 knee extension, ankle dorsi/plantar flexion  Skin: Right upper chest wall port.  Psychiatric: Her speech is normal and behavior is normal.  Assessment & Plan:  62 y.o. female with history of HTN presents for hospital follow up for PRES.    1. Deficits with mobility, endurance, self-care secondary to PRES  Cont HEP  2. Gastric ulcer/GIB with hemorrhagic shock/ABLA:    Follow up with GI, needs appointment, reminded again  3. Non-small cell CA, stage III  Cont follow up with Heme/Onc, currently undergoing Chemo  4. PRES with new onset seizures:   Continue Keppra bid  Follow up brain MRI, reminded patient to follow up

## 2017-07-31 NOTE — Progress Notes (Signed)
HEMATOLOGY/ONCOLOGY CLINIC NOTE  Date of Service: 08/05/2017   Patient Care Team: [name], MD as PCP - General (Family Medicine)  CHIEF COMPLAINTS/PURPOSE OF CONSULTATION:  F/u for continued management of recently diagnosed Non-Small Cell lung Carcinoma  HISTORY OF PRESENTING ILLNESS:   Kayla Wyatt is a wonderful 62 y.o. female who has been referred to [place] by Dr [name] for evaluation and management of Non-Small Cell Carcinoma. She is accompanied today by her husband. The pt reports that she is doing well overall.   Patient recently had a significant hospitalization on 04/02/2017 - as per DC summary by [name] " with abdominal pain, hematemesis, melena and ABLA due to GIB.  She underwent clipping of gastric ulcer and received multiple units PRBC but continued to have drop in H/H with bloody stool. NM GI scan was negative for bleeding and she underwent colonoscopy by Dr. [name] 1/31 showing diverticulosis but no signs of bleeding. Hospital course significant for R- gastrocnemius DVT, PAF, incidental findings of mediastinal mass as well as brief episode of hematuria. She was cleared to start on Xarelto on 02/01 for treatment of DVT. On 2/2 am she developed hematemesis with maroon stools and hypotension requiring fluid bolus as well as 2 units PRBCs. She continued to decline requiring intubation, pressors as well as reversal of anticoagulation with Kcentra on 02/3. She underwent UGI revealing large clot with fresh blood in stomach and underwent mesenteric arteriogram with percutaneous coil embolization fo distal tributary of left gastric artery and placement of IVC filter by interventional radiology.   A fib with RVR felt to be due to hemorrhagic shock and she converted to NSR. [name] felt no further cardiac work up indicated and did not recommend initiating anticoagulation in short term. To follow up with [name] after PET scan and routine biopsy after medically  stable. She continued to have bleeding and underwent visceral angiogram with embolization of inferior division of splenic artery and associated gastric arteries on 2/4. No surgical intervention needed per [name] with recommendations to continue monitoring H/H as well as for recurrence of hematochezia. She tolerated extubation on 2/6 and respiratory status improving.  On 2/11, she has episode of unresponsiveness with jerking of bilateral limbs that lasted about 3 minutes. She had amnesia of events but was back to baseline. EEG revealed "focal slowing over the right temporo-occipital regionandoccasional epileptiform discharges over the right occipital region". Head CT reviewed, unremarkable for acute intracranial process. MRI brain done revealing mild biparietal signal abnormality suspicious for PRES. [name] recommended starting patient on Keppra BID with repeat MRI in 2 weeks. New onset seizure likely provoked by PRES--if resolved--Ok to take patient off Keppra. Patient with resultant generalized weakness. CIR recommended due to functional deficits. "   Later as outpatient she had a PET scan on 05/14/17 which showed a hypermetabolic mediastinal mass which was subsequently biopsied with a bronchoscopy on 05/19/17. She notes that she had smoked for about 35-40 years, half a pack of a day. She reports that she has not had any other symptoms related to her smoking; she has never had to be on home oxygen or inhalers. She has recently begun Symbicort. While being in the hospital she lost about 20 lbs but has maintained her weight since being discharged.  The pt and her husband note that she is concerned about what to do if she is not able to go back to work after her 90-day leave. We recommended that they speak with our  social workers here to explore the available options for insurance coverage Scientist, water quality.  Of note prior to the patient's visit today, pt has had PET/CT completed on  05/14/17 with results revealing 1. The known mediastinal mass is intensely hypermetabolic, worrisome for malignancy. This could reflect lymphoma or small cell lung cancer. Tissue sampling recommended.2. No peripheral lung mass identified. New patchy ground-glass opacity in the lingula is mildly hypermetabolic and likely inflammatory.  On 05/19/17 the pt had a Fine Needle Aspiration which revealed Non-Small cell lung carcinoma.  Most recent lab results (05/19/17) of CBC  is as follows: all values are WNL except for RBC at 2.90, Hgb at 8.6, HCT at 28.0, RDW at 16.4.  On review of systems, pt reports knee pain, and denies abdominal pains, leg swelling, SOB, CP, and any other symptoms.   On PMHx the pt reports acute blood loss anemia, GI hemorrhage with melena, hypercholesteremia, HTN, persistent atrial fibrillation with rapid ventricular response, Vitamin D deficiency, denies liver and kidney problems. On Social Hx the pt reports having smoked for 35-40 years of half a pack per day. She quit smoking on 03/31/17.  On Family Hx the pt reports that her father had heart disease.  Interval History:   [name] returns today regarding her Non-Small Cell Carcinoma. Today is C3D1 of her Carboplatin/paclitaxel treatment. The patient's last visit with [place] was on 07/14/17. She is accompanied today by her husband. The pt reports that she is doing well overall.  The pt reports that she took one 325 mg Tylenol for her shoulder pain after her Neulasta shot, which did not address all of her pain. Her shoulder pain has since resolved. She notes that she has been staying well hydrated and has been eating well as well as walking 20-30 minutes each day.   She notes that she required Ativan during her previous Brain MRI in February and would like to have this again prior to her next Brain MRI.   She notes that she does not yet have a follow up with Dr Lisbeth Renshaw.   Lab results today (08/05/17) of CBC, CMP, and Reticulocytes  is as follows: all values are WNL except for Hgb at 11.2, RDW at 18.2.  Magnesium 08/05/17 is at 1.6.   On review of systems, pt reports eating well, resolved shoulder pain, and denies leg swelling, diarrhea, constipation, problems breathing, nausea, peripheral neuropathy, jaw pain, pain along the spine, abdominal pains, and any other symptoms.    MEDICAL HISTORY:  Past Medical History:  Diagnosis Date  . Acute blood loss anemia 04/02/2017  . Anxiety   . Gastrointestinal hemorrhage with melena   . Headache   . Hypercholesteremia   . Hypertension   . Persistent atrial fibrillation with rapid ventricular response ([place]) 04/07/2017  . Seizures ([place])    04/2017 while in hospital at [facility]  . Vitamin D deficiency     SURGICAL HISTORY: Past Surgical History:  Procedure Laterality Date  . COLONOSCOPY Left 04/09/2017   Procedure: COLONOSCOPY;  Surgeon: Carol Ada, MD;  Location: [facility];  Service: Endoscopy;  Laterality: Left;  . ESOPHAGOGASTRODUODENOSCOPY N/A 04/03/2017   Procedure: ESOPHAGOGASTRODUODENOSCOPY (EGD);  Surgeon: Carol Ada, MD;  Location: [facility];  Service: Endoscopy;  Laterality: N/A;  . ESOPHAGOGASTRODUODENOSCOPY (EGD) WITH PROPOFOL N/A 04/12/2017   Procedure: ESOPHAGOGASTRODUODENOSCOPY (EGD) WITH PROPOFOL;  Surgeon: Milus Banister, MD;  Location: [facility];  Service: Endoscopy;  Laterality: N/A;  . IR ANGIOGRAM SELECTIVE EACH ADDITIONAL VESSEL  04/12/2017  .  IR ANGIOGRAM SELECTIVE EACH ADDITIONAL VESSEL  04/13/2017  . IR ANGIOGRAM SELECTIVE EACH ADDITIONAL VESSEL  04/13/2017  . IR ANGIOGRAM SELECTIVE EACH ADDITIONAL VESSEL  04/13/2017  . IR ANGIOGRAM VISCERAL SELECTIVE  04/12/2017  . IR ANGIOGRAM VISCERAL SELECTIVE  04/13/2017  . IR EMBO ART  VEN HEMORR LYMPH EXTRAV  INC GUIDE ROADMAPPING  04/12/2017  . IR EMBO ART  VEN HEMORR LYMPH EXTRAV  INC GUIDE ROADMAPPING  04/13/2017  . IR FLUORO GUIDE CV LINE RIGHT  04/13/2017  . IR FLUORO GUIDE PORT INSERTION RIGHT  07/27/2017  . IR  IVC FILTER PLMT / S&I /IMG GUID/MOD SED  04/12/2017  . IR US GUIDE VASC ACCESS RIGHT  04/12/2017  . IR US GUIDE VASC ACCESS RIGHT  04/13/2017  . IR US GUIDE VASC ACCESS RIGHT  04/13/2017  . IR US GUIDE VASC ACCESS RIGHT  07/27/2017  . LEG SURGERY  1974   Blood Clot Removal   . VIDEO BRONCHOSCOPY WITH ENDOBRONCHIAL ULTRASOUND N/A 05/19/2017   Procedure: VIDEO BRONCHOSCOPY WITH ENDOBRONCHIAL ULTRASOUND;  Surgeon: Grace Isaac, MD;  Location: [facility];  Service: Thoracic;  Laterality: N/A;    SOCIAL HISTORY: Social History   Socioeconomic History  . Marital status: Single    Spouse name: Not on file  . Number of children: Not on file  . Years of education: Not on file  . Highest education level: Not on file  Occupational History  . Occupation: Has to lift heavy boxes at times.    Employer: [employer]  Social Needs  . Financial resource strain: Not on file  . Food insecurity:    Worry: Not on file    Inability: Not on file  . Transportation needs:    Medical: Not on file    Non-medical: Not on file  Tobacco Use  . Smoking status: Former Smoker    Packs/day: 0.50    Years: 40.00    Pack years: 20.00    Types: Cigarettes    Last attempt to quit: 03/31/2017    Years since quitting: 0.3  . Smokeless tobacco: Never Used  Substance and Sexual Activity  . Alcohol use: No    Frequency: Never  . Drug use: No  . Sexual activity: Not Currently  Lifestyle  . Physical activity:    Days per week: Not on file    Minutes per session: Not on file  . Stress: Not on file  Relationships  . Social connections:    Talks on phone: Not on file    Gets together: Not on file    Attends religious service: Not on file    Active member of club or organization: Not on file    Attends meetings of clubs or organizations: Not on file    Relationship status: Not on file  . Intimate partner violence:    Fear of current or ex partner: Not on file    Emotionally abused: Not on file    Physically abused:  Not on file    Forced sexual activity: Not on file  Other Topics Concern  . Not on file  Social History Narrative  . Not on file    FAMILY HISTORY: Family History  Problem Relation Age of Onset  . Heart disease Father     ALLERGIES:  has No Known Allergies.  MEDICATIONS:  Current Outpatient Medications  Medication Sig Dispense Refill  . acetaminophen (TYLENOL) 325 MG tablet Take 1-2 tablets (325-650 mg total) by mouth every 4 (four) hours as  needed for mild pain.    [name] alum & mag hydroxide-simeth (MAALOX MULTI SYMPTOM MAX ST) 400-400-40 MG/5ML suspension Take 10 mLs by mouth every 6 (six) hours as needed for indigestion. 355 mL 0  . budesonide-formoterol (SYMBICORT) 80-4.5 MCG/ACT inhaler Inhale 2 puffs into the lungs 2 (two) times daily. 1 Inhaler 12  . cholecalciferol (VITAMIN D) 1000 units tablet Take 1,000 Units by mouth 2 (two) times daily.    . collagenase (SANTYL) ointment Apply topically daily. To sacral area with damp to dry dressing. Change daily 15 g 0  . dexamethasone (DECADRON) 4 MG tablet Take 2 tablets (8 mg total) by mouth daily. Start the day after chemotherapy for 2 days. 30 tablet 1  . levETIRAcetam (KEPPRA) 500 MG tablet TK 1 T PO BID  6  . ondansetron (ZOFRAN) 8 MG tablet Take 1 tablet (8 mg total) by mouth 2 (two) times daily as needed for refractory nausea / vomiting. Start on day 3 after chemo. 30 tablet 1  . pantoprazole (PROTONIX) 40 MG tablet TK 1 T PO BID  6  . polycarbophil (FIBERCON) 625 MG tablet Take 2 tablets (1,250 mg total) by mouth 2 (two) times daily. 120 tablet 0  . potassium chloride SA (K-DUR,KLOR-CON) 20 MEQ tablet Take 1 tablet (20 mEq total) by mouth 2 (two) times daily. 60 tablet 0  . prochlorperazine (COMPAZINE) 10 MG tablet Take 1 tablet (10 mg total) by mouth every 6 (six) hours as needed (Nausea or vomiting). 30 tablet 1  . saccharomyces boulardii (FLORASTOR) 250 MG capsule Take 1 capsule (250 mg total) by mouth 2 (two) times daily. 100  capsule 0   No current facility-administered medications for this visit.     REVIEW OF SYSTEMS:   A 10+ POINT REVIEW OF SYSTEMS WAS OBTAINED including neurology, dermatology, psychiatry, cardiac, respiratory, lymph, extremities, GI, GU, Musculoskeletal, constitutional, breasts, reproductive, HEENT.  All pertinent positives are noted in the HPI.  All others are negative.   PHYSICAL EXAMINATION: ECOG PERFORMANCE STATUS: 1 - Symptomatic but completely ambulatory  . Vitals:   08/05/17 0930  BP: (!) 149/51  Pulse: 72  Resp: 18  Temp: 98 F (36.7 C)  SpO2: 96%   Filed Weights   08/05/17 0930  Weight: 130 lb 6.4 oz (59.1 kg)   .Body mass index is 23.1 kg/m.  GENERAL:alert, in no acute distress and comfortable SKIN: no acute rashes, no significant lesions EYES: conjunctiva are pink and non-injected, sclera anicteric OROPHARYNX: MMM, no exudates, no oropharyngeal erythema or ulceration NECK: supple, no JVD LYMPH:  no palpable lymphadenopathy in the cervical, axillary or inguinal regions LUNGS: clear to auscultation b/l with normal respiratory effort HEART: regular rate & rhythm ABDOMEN:  normoactive bowel sounds , non tender, not distended. Extremity: no pedal edema PSYCH: alert & oriented x 3 with fluent speech NEURO: no focal motor/sensory deficits   LABORATORY DATA:  I have reviewed the data as listed  . CBC Latest Ref Rng & Units 08/05/2017 07/27/2017 07/14/2017  WBC 3.9 - 10.3 K/uL 6.6 12.4(H) 7.1  Hemoglobin 11.6 - 15.9 g/dL 11.2(L) 11.3(L) 13.1  Hematocrit 34.8 - 46.6 % 34.9 34.9(L) 40.9  Platelets 145 - 400 K/uL 155 145(L) 204    . CMP Latest Ref Rng & Units 08/05/2017 07/14/2017 06/30/2017  Glucose 70 - 140 mg/dL 95 80 90  BUN 7 - 26 mg/dL '19 15 18  '$ Creatinine 0.60 - 1.10 mg/dL 0.71 0.73 0.77  Sodium 136 - 145 mmol/L 143 143 142  Potassium 3.5 - 5.1 mmol/L 3.6 3.5 4.5  Chloride 98 - 109 mmol/L 108 110(H) 107  CO2 22 - 29 mmol/L '25 24 26  '$ Calcium 8.4 - 10.4 mg/dL  9.7 10.2 9.6  Total Protein 6.4 - 8.3 g/dL 6.9 7.6 6.3(L)  Total Bilirubin 0.2 - 1.2 mg/dL 0.2 0.2 <0.2(L)  Alkaline Phos 40 - 150 U/L 83 82 101  AST 5 - 34 U/L '21 16 15  '$ ALT 0 - 55 U/L '24 10 13   '$ 05/19/17 Fine Needle Aspiration:  05/19/17 Bronchial Washing Specimen B:   RADIOGRAPHIC STUDIES: I have personally reviewed the radiological images as listed and agreed with the findings in the report. Ir US Guide Vasc Access Right  Result Date: 07/27/2017 INDICATION: History of lung cancer. In need of durable intravenous access for chemotherapy administration. EXAM: IMPLANTED PORT A CATH PLACEMENT WITH ULTRASOUND AND FLUOROSCOPIC GUIDANCE COMPARISON:  PET-CT - 05/14/2017 MEDICATIONS: Ancef 2 gm IV; The antibiotic was administered within an appropriate time interval prior to skin puncture. ANESTHESIA/SEDATION: Moderate (conscious) sedation was employed during this procedure. A total of Versed 3 mg and Fentanyl 150 mcg was administered intravenously. Moderate Sedation Time: 27 minutes. The patient's level of consciousness and vital signs were monitored continuously by radiology nursing throughout the procedure under my direct supervision. CONTRAST:  None FLUOROSCOPY TIME:  12 seconds (2 mGy) COMPLICATIONS: None immediate. PROCEDURE: The procedure, risks, benefits, and alternatives were explained to the patient. Questions regarding the procedure were encouraged and answered. The patient understands and consents to the procedure. The right neck and chest were prepped with chlorhexidine in a sterile fashion, and a sterile drape was applied covering the operative field. Maximum barrier sterile technique with sterile gowns and gloves were used for the procedure. A timeout was performed prior to the initiation of the procedure. Local anesthesia was provided with 1% lidocaine with epinephrine. After creating a small venotomy incision, a micropuncture kit was utilized to access the internal jugular vein. Real-time  ultrasound guidance was utilized for vascular access including the acquisition of a permanent ultrasound image documenting patency of the accessed vessel. The microwire was utilized to measure appropriate catheter length. A subcutaneous port pocket was then created along the upper chest wall utilizing a combination of sharp and blunt dissection. The pocket was irrigated with sterile saline. A single lumen thin power injectable port was chosen for placement. The 8 Fr catheter was tunneled from the port pocket site to the venotomy incision. The port was placed in the pocket. The external catheter was trimmed to appropriate length. At the venotomy, an 8 Fr peel-away sheath was placed over a guidewire under fluoroscopic guidance. The catheter was then placed through the sheath and the sheath was removed. Final catheter positioning was confirmed and documented with a fluoroscopic spot radiograph. The port was accessed with a Huber needle, aspirated and flushed with heparinized saline. The venotomy site was closed with an interrupted 4-0 Vicryl suture. The port pocket incision was closed with interrupted 2-0 Vicryl suture and the skin was opposed with a running subcuticular 4-0 Vicryl suture. Dermabond and Steri-strips were applied to both incisions. Dressings were placed. The patient tolerated the procedure well without immediate post procedural complication. FINDINGS: After catheter placement, the tip lies within the superior cavoatrial junction. The catheter aspirates and flushes normally and is ready for immediate use. IMPRESSION: Successful placement of a right internal jugular approach power injectable Port-A-Cath. The catheter is ready for immediate use. Electronically Signed   By: Sandi Mariscal  M.D.   On: 07/27/2017 14:51   Ir Cyndy Freeze Guide Port Insertion Right  Result Date: 07/27/2017 INDICATION: History of lung cancer. In need of durable intravenous access for chemotherapy administration. EXAM: IMPLANTED PORT A  CATH PLACEMENT WITH ULTRASOUND AND FLUOROSCOPIC GUIDANCE COMPARISON:  PET-CT - 05/14/2017 MEDICATIONS: Ancef 2 gm IV; The antibiotic was administered within an appropriate time interval prior to skin puncture. ANESTHESIA/SEDATION: Moderate (conscious) sedation was employed during this procedure. A total of Versed 3 mg and Fentanyl 150 mcg was administered intravenously. Moderate Sedation Time: 27 minutes. The patient's level of consciousness and vital signs were monitored continuously by radiology nursing throughout the procedure under my direct supervision. CONTRAST:  None FLUOROSCOPY TIME:  12 seconds (2 mGy) COMPLICATIONS: None immediate. PROCEDURE: The procedure, risks, benefits, and alternatives were explained to the patient. Questions regarding the procedure were encouraged and answered. The patient understands and consents to the procedure. The right neck and chest were prepped with chlorhexidine in a sterile fashion, and a sterile drape was applied covering the operative field. Maximum barrier sterile technique with sterile gowns and gloves were used for the procedure. A timeout was performed prior to the initiation of the procedure. Local anesthesia was provided with 1% lidocaine with epinephrine. After creating a small venotomy incision, a micropuncture kit was utilized to access the internal jugular vein. Real-time ultrasound guidance was utilized for vascular access including the acquisition of a permanent ultrasound image documenting patency of the accessed vessel. The microwire was utilized to measure appropriate catheter length. A subcutaneous port pocket was then created along the upper chest wall utilizing a combination of sharp and blunt dissection. The pocket was irrigated with sterile saline. A single lumen thin power injectable port was chosen for placement. The 8 Fr catheter was tunneled from the port pocket site to the venotomy incision. The port was placed in the pocket. The external catheter  was trimmed to appropriate length. At the venotomy, an 8 Fr peel-away sheath was placed over a guidewire under fluoroscopic guidance. The catheter was then placed through the sheath and the sheath was removed. Final catheter positioning was confirmed and documented with a fluoroscopic spot radiograph. The port was accessed with a Huber needle, aspirated and flushed with heparinized saline. The venotomy site was closed with an interrupted 4-0 Vicryl suture. The port pocket incision was closed with interrupted 2-0 Vicryl suture and the skin was opposed with a running subcuticular 4-0 Vicryl suture. Dermabond and Steri-strips were applied to both incisions. Dressings were placed. The patient tolerated the procedure well without immediate post procedural complication. FINDINGS: After catheter placement, the tip lies within the superior cavoatrial junction. The catheter aspirates and flushes normally and is ready for immediate use. IMPRESSION: Successful placement of a right internal jugular approach power injectable Port-A-Cath. The catheter is ready for immediate use. Electronically Signed   By: Sandi Mariscal M.D.   On: 07/27/2017 14:51   MRI brain 04/21/2017: IMPRESSION: 1. Motion degraded examination. 2. 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 3. Otherwise negative MRI of the head with and without contrast for age.  Electronically Signed   By: Elon Alas M.D.   On: 04/22/2017 00:34    ASSESSMENT & PLAN:   62 y.o. female with  1. Recently diagnosed Non-Small Cell Carcinoma on bronchoscopy/cytology. Not enough tissue for further characterization or mutation testing. Presenting with mediastinal nodal mass - TxN2 Mx (Atleast Stage III disease) ? Lingular primary vs inflammation. No overt evidence of metastatic disease.  -  EGFR mutation studies on blood negative.  -Carboplatin/Taxol with G-CSF support to starting 06/23/17 (will be q3weeks  x 3 cycles) followed by RT  2. Bone Pain from neulasta - managed with Tylenol  3. Recent SZ -currently on Keppra  4. Recent rt calf DVT - off anticoagulation due to massive GI bleeding. S/p IVC filter.  5. Recent GI bleeding from Gastric ulcer --needing clipping and arterial embolization. hgb normal at 13.1 today. On PPI BID  6. Low Magnesium -- today at 1.6 (07/14/17) -On oral magnesium '200mg'$  BID  7. H/o ctx /steroid related thrush --now resolved.  PLAN   -Discussed pt labwork today, 08/05/17; Magnesium slightly low at 1.6, blood counts and chemistries are otherwise stable.  -Pt to pick up emla cream -Take two Tylenol and continue with claritin, and ibuprofen if needed. -Pt will call our clinic if bone pain becomes more troublesome than Tylenol or Advil can cover -Will order a PET scan and Brain MRI with plans to radiate whatever is remaining, followed by immunotherapy - durvalumab.  -Prescribing Ativan for Brain MRI -Continue PO Magnesium replacement -Will see pt back in 2weeks and she should follow up with Dr Lisbeth Renshaw as well.   PET/CT in 10 days MRI Brain in 10 days RTC with Dr Irene Limbo in 2 weeks    All of the patients questions were answered with apparent satisfaction. The patient knows to call the clinic with any problems, questions or concerns.  The toal time spent in the appt was 25 minutes and more than 50% was on counseling and direct patient cares.   Sullivan Lone MD MS AAHIVMS [facility] Hematology/Oncology Physician [facility]  (Office):       [phone] (Work cell):  [phone] (Fax):           [phone]  08/05/2017 9:46 AM  I, Baldwin Jamaica, am acting as a Education administrator for Dr Irene Limbo.   .I have reviewed the above documentation for accuracy and completeness, and I agree with the above. Brunetta Genera MD MS

## 2017-08-04 ENCOUNTER — Ambulatory Visit: Payer: BLUE CROSS/BLUE SHIELD

## 2017-08-04 ENCOUNTER — Other Ambulatory Visit: Payer: BLUE CROSS/BLUE SHIELD

## 2017-08-04 ENCOUNTER — Ambulatory Visit: Payer: BLUE CROSS/BLUE SHIELD | Admitting: Hematology

## 2017-08-05 ENCOUNTER — Telehealth: Payer: Self-pay

## 2017-08-05 ENCOUNTER — Inpatient Hospital Stay (HOSPITAL_BASED_OUTPATIENT_CLINIC_OR_DEPARTMENT_OTHER): Payer: BLUE CROSS/BLUE SHIELD | Admitting: Hematology

## 2017-08-05 ENCOUNTER — Encounter: Payer: Self-pay | Admitting: Hematology

## 2017-08-05 ENCOUNTER — Other Ambulatory Visit: Payer: BLUE CROSS/BLUE SHIELD

## 2017-08-05 ENCOUNTER — Inpatient Hospital Stay: Payer: BLUE CROSS/BLUE SHIELD

## 2017-08-05 ENCOUNTER — Ambulatory Visit: Payer: BLUE CROSS/BLUE SHIELD

## 2017-08-05 ENCOUNTER — Ambulatory Visit: Payer: BLUE CROSS/BLUE SHIELD | Admitting: Hematology

## 2017-08-05 VITALS — BP 149/51 | HR 72 | Temp 98.0°F | Resp 18 | Ht 63.0 in | Wt 130.4 lb

## 2017-08-05 DIAGNOSIS — C3490 Malignant neoplasm of unspecified part of unspecified bronchus or lung: Secondary | ICD-10-CM

## 2017-08-05 DIAGNOSIS — M898X9 Other specified disorders of bone, unspecified site: Secondary | ICD-10-CM | POA: Diagnosis not present

## 2017-08-05 DIAGNOSIS — F17210 Nicotine dependence, cigarettes, uncomplicated: Secondary | ICD-10-CM

## 2017-08-05 DIAGNOSIS — C3491 Malignant neoplasm of unspecified part of right bronchus or lung: Secondary | ICD-10-CM

## 2017-08-05 DIAGNOSIS — I824Z1 Acute embolism and thrombosis of unspecified deep veins of right distal lower extremity: Secondary | ICD-10-CM

## 2017-08-05 DIAGNOSIS — Z7901 Long term (current) use of anticoagulants: Secondary | ICD-10-CM

## 2017-08-05 DIAGNOSIS — Z7189 Other specified counseling: Secondary | ICD-10-CM

## 2017-08-05 LAB — CBC WITH DIFFERENTIAL/PLATELET
Basophils Absolute: 0 10*3/uL (ref 0.0–0.1)
Basophils Relative: 1 %
Eosinophils Absolute: 0 10*3/uL (ref 0.0–0.5)
Eosinophils Relative: 0 %
HCT: 34.9 % (ref 34.8–46.6)
Hemoglobin: 11.2 g/dL — ABNORMAL LOW (ref 11.6–15.9)
Lymphocytes Relative: 29 %
Lymphs Abs: 1.9 10*3/uL (ref 0.9–3.3)
MCH: 28.9 pg (ref 25.1–34.0)
MCHC: 32.1 g/dL (ref 31.5–36.0)
MCV: 90.2 fL (ref 79.5–101.0)
Monocytes Absolute: 0.6 10*3/uL (ref 0.1–0.9)
Monocytes Relative: 8 %
Neutro Abs: 4.1 10*3/uL (ref 1.5–6.5)
Neutrophils Relative %: 62 %
Platelets: 155 10*3/uL (ref 145–400)
RBC: 3.87 MIL/uL (ref 3.70–5.45)
RDW: 18.2 % — ABNORMAL HIGH (ref 11.2–14.5)
WBC: 6.6 10*3/uL (ref 3.9–10.3)

## 2017-08-05 LAB — CMP (CANCER CENTER ONLY)
ALT: 24 U/L (ref 0–55)
AST: 21 U/L (ref 5–34)
Albumin: 3.7 g/dL (ref 3.5–5.0)
Alkaline Phosphatase: 83 U/L (ref 40–150)
Anion gap: 10 (ref 3–11)
BUN: 19 mg/dL (ref 7–26)
CO2: 25 mmol/L (ref 22–29)
Calcium: 9.7 mg/dL (ref 8.4–10.4)
Chloride: 108 mmol/L (ref 98–109)
Creatinine: 0.71 mg/dL (ref 0.60–1.10)
GFR, Est AFR Am: >60 mL/min (ref >60)
GFR, Estimated: >60 mL/min (ref >60)
Glucose, Bld: 95 mg/dL (ref 70–140)
Potassium: 3.6 mmol/L (ref 3.5–5.1)
Sodium: 143 mmol/L (ref 136–145)
Total Bilirubin: 0.2 mg/dL (ref 0.2–1.2)
Total Protein: 6.9 g/dL (ref 6.4–8.3)

## 2017-08-05 LAB — MAGNESIUM: Magnesium: 1.6 mg/dL — ABNORMAL LOW (ref 1.7–2.4)

## 2017-08-05 MED ORDER — PACLITAXEL CHEMO INJECTION 300 MG/50ML
175.0000 mg/m2 | Freq: Once | INTRAVENOUS | Status: AC
  Administered 2017-08-05: 282 mg via INTRAVENOUS
  Filled 2017-08-05: qty 47

## 2017-08-05 MED ORDER — DIPHENHYDRAMINE HCL 50 MG/ML IJ SOLN
50.0000 mg | Freq: Once | INTRAMUSCULAR | Status: AC
  Administered 2017-08-05: 50 mg via INTRAVENOUS

## 2017-08-05 MED ORDER — FAMOTIDINE IN NACL 20-0.9 MG/50ML-% IV SOLN
20.0000 mg | Freq: Once | INTRAVENOUS | Status: AC
  Administered 2017-08-05: 20 mg via INTRAVENOUS

## 2017-08-05 MED ORDER — DIPHENHYDRAMINE HCL 50 MG/ML IJ SOLN
INTRAMUSCULAR | Status: AC
  Filled 2017-08-05: qty 1

## 2017-08-05 MED ORDER — PALONOSETRON HCL INJECTION 0.25 MG/5ML
INTRAVENOUS | Status: AC
  Filled 2017-08-05: qty 5

## 2017-08-05 MED ORDER — SODIUM CHLORIDE 0.9 % IV SOLN
449.0000 mg | Freq: Once | INTRAVENOUS | Status: AC
  Administered 2017-08-05: 450 mg via INTRAVENOUS
  Filled 2017-08-05: qty 45

## 2017-08-05 MED ORDER — HEPARIN SOD (PORK) LOCK FLUSH 100 UNIT/ML IV SOLN
500.0000 [IU] | Freq: Once | INTRAVENOUS | Status: AC | PRN
  Administered 2017-08-05: 500 [IU]
  Filled 2017-08-05: qty 5

## 2017-08-05 MED ORDER — MAGNESIUM CHLORIDE 64 MG PO TBEC: 2.0000 | DELAYED_RELEASE_TABLET | Freq: Every day | ORAL | 1 refills | Status: DC

## 2017-08-05 MED ORDER — FAMOTIDINE IN NACL 20-0.9 MG/50ML-% IV SOLN
INTRAVENOUS | Status: AC
  Filled 2017-08-05: qty 50

## 2017-08-05 MED ORDER — LORAZEPAM 0.5 MG PO TABS: 1.0000 mg | ORAL_TABLET | Freq: Once | ORAL | 0 refills | Status: DC | PRN

## 2017-08-05 MED ORDER — SODIUM CHLORIDE 0.9% FLUSH
10.0000 mL | INTRAVENOUS | Status: DC | PRN
  Administered 2017-08-05: 10 mL
  Filled 2017-08-05: qty 10

## 2017-08-05 MED ORDER — SODIUM CHLORIDE 0.9 % IV SOLN
Freq: Once | INTRAVENOUS | Status: AC
  Administered 2017-08-05: 11:00:00 via INTRAVENOUS
  Filled 2017-08-05: qty 5

## 2017-08-05 MED ORDER — PALONOSETRON HCL INJECTION 0.25 MG/5ML
0.2500 mg | Freq: Once | INTRAVENOUS | Status: AC
  Administered 2017-08-05: 0.25 mg via INTRAVENOUS

## 2017-08-05 NOTE — Patient Instructions (Signed)
[facility] Discharge Instructions for Patients Receiving Chemotherapy  Today you received the following chemotherapy agents Taxol and Carboplatin.   To help prevent nausea and vomiting after your treatment, we encourage you to take your nausea medication as prescribed.    If you develop nausea and vomiting that is not controlled by your nausea medication, call the clinic.   BELOW ARE SYMPTOMS THAT SHOULD BE REPORTED IMMEDIATELY:  *FEVER GREATER THAN 100.5 F  *CHILLS WITH OR WITHOUT FEVER  NAUSEA AND VOMITING THAT IS NOT CONTROLLED WITH YOUR NAUSEA MEDICATION  *UNUSUAL SHORTNESS OF BREATH  *UNUSUAL BRUISING OR BLEEDING  TENDERNESS IN MOUTH AND THROAT WITH OR WITHOUT PRESENCE OF ULCERS  *URINARY PROBLEMS  *BOWEL PROBLEMS  UNUSUAL RASH Items with * indicate a potential emergency and should be followed up as soon as possible.  Feel free to call the clinic should you have any questions or concerns. The clinic phone number is [phone].  Please show the CHEMO ALERT CARD at check-in to the Emergency Department and triage nurse.   

## 2017-08-05 NOTE — Telephone Encounter (Signed)
Printed avs and and calender of upcoming appointment. Per 5/29 los

## 2017-08-07 ENCOUNTER — Ambulatory Visit: Payer: BLUE CROSS/BLUE SHIELD

## 2017-08-07 ENCOUNTER — Inpatient Hospital Stay: Payer: BLUE CROSS/BLUE SHIELD

## 2017-08-07 VITALS — BP 121/54 | HR 50 | Temp 98.0°F | Resp 18

## 2017-08-07 DIAGNOSIS — C3490 Malignant neoplasm of unspecified part of unspecified bronchus or lung: Secondary | ICD-10-CM

## 2017-08-07 DIAGNOSIS — Z7189 Other specified counseling: Secondary | ICD-10-CM

## 2017-08-07 MED ORDER — PEGFILGRASTIM-CBQV 6 MG/0.6ML ~~LOC~~ SOSY
PREFILLED_SYRINGE | SUBCUTANEOUS | Status: AC
  Filled 2017-08-07: qty 0.6

## 2017-08-07 MED ORDER — PEGFILGRASTIM-CBQV 6 MG/0.6ML ~~LOC~~ SOSY
6.0000 mg | PREFILLED_SYRINGE | Freq: Once | SUBCUTANEOUS | Status: AC
  Administered 2017-08-07: 6 mg via SUBCUTANEOUS

## 2017-08-12 ENCOUNTER — Other Ambulatory Visit: Payer: Self-pay | Admitting: Hematology

## 2017-08-12 DIAGNOSIS — C3490 Malignant neoplasm of unspecified part of unspecified bronchus or lung: Secondary | ICD-10-CM

## 2017-08-13 ENCOUNTER — Telehealth: Payer: Self-pay

## 2017-08-13 NOTE — Telephone Encounter (Signed)
Returned patient's call regarding scans that have not been scheduled with the schedule of appointments. Patient stated that she has already spoken with scheduling and is aware of the PET scan and MRI if brain on 6/12.

## 2017-08-13 NOTE — Telephone Encounter (Signed)
Pt aware that PET and MRI ordered. Plan to call central scheduling to set up. Pt verbalized understanding to call back and reschedule appt with Dr. Irene Limbo if scan appt after 08/20/17.

## 2017-08-19 ENCOUNTER — Ambulatory Visit (HOSPITAL_COMMUNITY)
Admission: RE | Admit: 2017-08-19 | Discharge: 2017-08-19 | Disposition: A | Discharge status: Home / Self Care | Payer: BLUE CROSS/BLUE SHIELD | Source: Ambulatory Visit | Attending: Hematology | Admitting: Hematology

## 2017-08-19 ENCOUNTER — Other Ambulatory Visit (HOSPITAL_COMMUNITY): Payer: BLUE CROSS/BLUE SHIELD

## 2017-08-19 DIAGNOSIS — I700 Atherosclerosis of aorta: Secondary | ICD-10-CM | POA: Insufficient documentation

## 2017-08-19 DIAGNOSIS — J432 Centrilobular emphysema: Secondary | ICD-10-CM | POA: Insufficient documentation

## 2017-08-19 DIAGNOSIS — R933 Abnormal findings on diagnostic imaging of other parts of digestive tract: Secondary | ICD-10-CM | POA: Insufficient documentation

## 2017-08-19 DIAGNOSIS — R222 Localized swelling, mass and lump, trunk: Secondary | ICD-10-CM | POA: Diagnosis not present

## 2017-08-19 DIAGNOSIS — I2510 Atherosclerotic heart disease of native coronary artery without angina pectoris: Secondary | ICD-10-CM | POA: Insufficient documentation

## 2017-08-19 DIAGNOSIS — C3490 Malignant neoplasm of unspecified part of unspecified bronchus or lung: Secondary | ICD-10-CM

## 2017-08-19 LAB — GLUCOSE, CAPILLARY: Glucose-Capillary: 94 mg/dL (ref 65–99)

## 2017-08-19 MED ORDER — FLUDEOXYGLUCOSE F - 18 (FDG) INJECTION
6.4000 | Freq: Once | INTRAVENOUS | Status: AC | PRN
  Administered 2017-08-19: 6.4 via INTRAVENOUS

## 2017-08-19 MED ORDER — GADOBENATE DIMEGLUMINE 529 MG/ML IV SOLN
15.0000 mL | Freq: Once | INTRAVENOUS | Status: AC | PRN
  Administered 2017-08-19: 12 mL via INTRAVENOUS

## 2017-08-19 NOTE — Progress Notes (Signed)
HEMATOLOGY/ONCOLOGY CLINIC NOTE  Date of Service: 08/20/2017   Patient Care Team: [name], MD as PCP - General (Family Medicine)  CHIEF COMPLAINTS/PURPOSE OF CONSULTATION:  F/u for continued management of recently diagnosed Non-Small Cell lung Carcinoma  HISTORY OF PRESENTING ILLNESS:   Kayla Wyatt is a wonderful 62 y.o. female who has been referred to [place] by Dr [name] for evaluation and management of Non-Small Cell Carcinoma. She is accompanied today by her husband. The pt reports that she is doing well overall.   Patient recently had a significant hospitalization on 04/02/2017 - as per DC summary by [name] " with abdominal pain, hematemesis, melena and ABLA due to GIB.  She underwent clipping of gastric ulcer and received multiple units PRBC but continued to have drop in H/H with bloody stool. NM GI scan was negative for bleeding and she underwent colonoscopy by Dr. [name] 1/31 showing diverticulosis but no signs of bleeding. Hospital course significant for R- gastrocnemius DVT, PAF, incidental findings of mediastinal mass as well as brief episode of hematuria. She was cleared to start on Xarelto on 02/01 for treatment of DVT. On 2/2 am she developed hematemesis with maroon stools and hypotension requiring fluid bolus as well as 2 units PRBCs. She continued to decline requiring intubation, pressors as well as reversal of anticoagulation with Kcentra on 02/3. She underwent UGI revealing large clot with fresh blood in stomach and underwent mesenteric arteriogram with percutaneous coil embolization fo distal tributary of left gastric artery and placement of IVC filter by interventional radiology.   A fib with RVR felt to be due to hemorrhagic shock and she converted to NSR. [name] felt no further cardiac work up indicated and did not recommend initiating anticoagulation in short term. To follow up with [name] after PET scan and routine biopsy after medically  stable. She continued to have bleeding and underwent visceral angiogram with embolization of inferior division of splenic artery and associated gastric arteries on 2/4. No surgical intervention needed per [name] with recommendations to continue monitoring H/H as well as for recurrence of hematochezia. She tolerated extubation on 2/6 and respiratory status improving.  On 2/11, she has episode of unresponsiveness with jerking of bilateral limbs that lasted about 3 minutes. She had amnesia of events but was back to baseline. EEG revealed "focal slowing over the right temporo-occipital regionandoccasional epileptiform discharges over the right occipital region". Head CT reviewed, unremarkable for acute intracranial process. MRI brain done revealing mild biparietal signal abnormality suspicious for PRES. [name] recommended starting patient on Keppra BID with repeat MRI in 2 weeks. New onset seizure likely provoked by PRES--if resolved--Ok to take patient off Keppra. Patient with resultant generalized weakness. CIR recommended due to functional deficits. "   Later as outpatient she had a PET scan on 05/14/17 which showed a hypermetabolic mediastinal mass which was subsequently biopsied with a bronchoscopy on 05/19/17. She notes that she had smoked for about 35-40 years, half a pack of a day. She reports that she has not had any other symptoms related to her smoking; she has never had to be on home oxygen or inhalers. She has recently begun Symbicort. While being in the hospital she lost about 20 lbs but has maintained her weight since being discharged.  The pt and her husband note that she is concerned about what to do if she is not able to go back to work after her 90-day leave. We recommended that they speak with our  social workers here to explore the available options for insurance coverage Scientist, water quality.  Of note prior to the patient's visit today, pt has had PET/CT completed on  05/14/17 with results revealing 1. The known mediastinal mass is intensely hypermetabolic, worrisome for malignancy. This could reflect lymphoma or small cell lung cancer. Tissue sampling recommended.2. No peripheral lung mass identified. New patchy ground-glass opacity in the lingula is mildly hypermetabolic and likely inflammatory.  On 05/19/17 the pt had a Fine Needle Aspiration which revealed Non-Small cell lung carcinoma.  Most recent lab results (05/19/17) of CBC  is as follows: all values are WNL except for RBC at 2.90, Hgb at 8.6, HCT at 28.0, RDW at 16.4.  On review of systems, pt reports knee pain, and denies abdominal pains, leg swelling, SOB, CP, and any other symptoms.   On PMHx the pt reports acute blood loss anemia, GI hemorrhage with melena, hypercholesteremia, HTN, persistent atrial fibrillation with rapid ventricular response, Vitamin D deficiency, denies liver and kidney problems. On Social Hx the pt reports having smoked for 35-40 years of half a pack per day. She quit smoking on 03/31/17.  On Family Hx the pt reports that her father had heart disease.  Interval History:   [name] returns today regarding her Non-Small Cell Carcinoma. She is here after finishing C3 of her Carboplatin/paclitaxel treatment. The patient's last visit with [place] was on 08/05/17. She is accompanied today by her husband. The pt reports that she is doing well overall.   The pt reports that she has had a dry mouth in the interim but denies any mouth sores, lumps in her mouth or discomfort in the jaw. She denies noticing any difference in the way her tongue feels. She notes that her appetite has increased and she has gained some weight in the interim.   In discussion regarding the patient's lung capacity, the pt notes that currently she could walk several hundred yards without stopping with concerns for SOB. She doesn't believe she could walk a mile without stopping.   In discussion regarding the  patient's treatment after the recent imaging findings, as noted below, the pt notes that she is agreeable to chemotherapy and radiation if it is recommended.  The pt is not regularly seeing a dentist though she does have dental problems.    Though the pt had a seizure in the last several months, she is not seeing a neurologist but is taking Keppra and seeing Dr. [name].   Prior to today's visit the pt has had a Brain MRI on 08/19/17 which revealed No evidence of intracranial metastases. 2. Interval resolution of bilateral parietal signal abnormality favoring PRES  Additionally the pt had a PET/CT on 08/19/17 which revealed No significant change in size of hypermetabolic AP window mass. The degree of FDG uptake is slightly increased in the interval. 2. Persistent hypermetabolic focus involving the distal aspect of the tongue within SUV max of 16.46. Similar to previous exam. Correlation with direct visualization is advised to exclude head/neck malignancy. 3.  Emphysema (ICD10-J43.9). 4. Aortic atherosclerosis and 3 vessel coronary artery atherosclerotic calcifications. Aortic Atherosclerosis  Lab results (08/05/17) of CBC, CMP is as follows: all values are WNL except for HGB at 11.2, RDW at 18.2. Magnesium 08/05/17 is low at 1.66  On review of systems, pt reports eating well, good energy levels, weight gain, moving her bowels well, and denies mouth sores, jaw discomfort, SOB, abdominal pains, leg swelling, and any other symptoms.  MEDICAL HISTORY:  Past Medical History:  Diagnosis Date  . Acute blood loss anemia 04/02/2017  . Anxiety   . Gastrointestinal hemorrhage with melena   . Headache   . Hypercholesteremia   . Hypertension   . Persistent atrial fibrillation with rapid ventricular response ([place]) 04/07/2017  . Seizures ([place])    04/2017 while in hospital at [facility]  . Vitamin D deficiency     SURGICAL HISTORY: Past Surgical History:  Procedure Laterality Date  .  COLONOSCOPY Left 04/09/2017   Procedure: COLONOSCOPY;  Surgeon: Carol Ada, MD;  Location: [facility];  Service: Endoscopy;  Laterality: Left;  . ESOPHAGOGASTRODUODENOSCOPY N/A 04/03/2017   Procedure: ESOPHAGOGASTRODUODENOSCOPY (EGD);  Surgeon: Carol Ada, MD;  Location: [place];  Service: Endoscopy;  Laterality: N/A;  . ESOPHAGOGASTRODUODENOSCOPY (EGD) WITH PROPOFOL N/A 04/12/2017   Procedure: ESOPHAGOGASTRODUODENOSCOPY (EGD) WITH PROPOFOL;  Surgeon: Milus Banister, MD;  Location: [facility] ENDOSCOPY;  Service: Endoscopy;  Laterality: N/A;  . IR ANGIOGRAM SELECTIVE EACH ADDITIONAL VESSEL  04/12/2017  . IR ANGIOGRAM SELECTIVE EACH ADDITIONAL VESSEL  04/13/2017  . IR ANGIOGRAM SELECTIVE EACH ADDITIONAL VESSEL  04/13/2017  . IR ANGIOGRAM SELECTIVE EACH ADDITIONAL VESSEL  04/13/2017  . IR ANGIOGRAM VISCERAL SELECTIVE  04/12/2017  . IR ANGIOGRAM VISCERAL SELECTIVE  04/13/2017  . IR EMBO ART  VEN HEMORR LYMPH EXTRAV  INC GUIDE ROADMAPPING  04/12/2017  . IR EMBO ART  VEN HEMORR LYMPH EXTRAV  INC GUIDE ROADMAPPING  04/13/2017  . IR FLUORO GUIDE CV LINE RIGHT  04/13/2017  . IR FLUORO GUIDE PORT INSERTION RIGHT  07/27/2017  . IR IVC FILTER PLMT / S&I /IMG GUID/MOD SED  04/12/2017  . IR US GUIDE VASC ACCESS RIGHT  04/12/2017  . IR US GUIDE VASC ACCESS RIGHT  04/13/2017  . IR US GUIDE VASC ACCESS RIGHT  04/13/2017  . IR US GUIDE VASC ACCESS RIGHT  07/27/2017  . LEG SURGERY  1974   Blood Clot Removal   . VIDEO BRONCHOSCOPY WITH ENDOBRONCHIAL ULTRASOUND N/A 05/19/2017   Procedure: VIDEO BRONCHOSCOPY WITH ENDOBRONCHIAL ULTRASOUND;  Surgeon: Grace Isaac, MD;  Location: [place] [facility];  Service: Thoracic;  Laterality: N/A;    SOCIAL HISTORY: Social History   Socioeconomic History  . Marital status: Single    Spouse name: Not on file  . Number of children: Not on file  . Years of education: Not on file  . Highest education level: Not on file  Occupational History  . Occupation: Has to lift heavy boxes at times.     Employer: [employer]  Social Needs  . Financial resource strain: Not on file  . Food insecurity:    Worry: Not on file    Inability: Not on file  . Transportation needs:    Medical: Not on file    Non-medical: Not on file  Tobacco Use  . Smoking status: Former Smoker    Packs/day: 0.50    Years: 40.00    Pack years: 20.00    Types: Cigarettes    Last attempt to quit: 03/31/2017    Years since quitting: 0.3  . Smokeless tobacco: Never Used  Substance and Sexual Activity  . Alcohol use: No    Frequency: Never  . Drug use: No  . Sexual activity: Not Currently  Lifestyle  . Physical activity:    Days per week: Not on file    Minutes per session: Not on file  . Stress: Not on file  Relationships  . Social connections:    Talks  on phone: Not on file    Gets together: Not on file    Attends religious service: Not on file    Active member of club or organization: Not on file    Attends meetings of clubs or organizations: Not on file    Relationship status: Not on file  . Intimate partner violence:    Fear of current or ex partner: Not on file    Emotionally abused: Not on file    Physically abused: Not on file    Forced sexual activity: Not on file  Other Topics Concern  . Not on file  Social History Narrative  . Not on file    FAMILY HISTORY: Family History  Problem Relation Age of Onset  . Heart disease Father     ALLERGIES:  has No Known Allergies.  MEDICATIONS:  Current Outpatient Medications  Medication Sig Dispense Refill  . acetaminophen (TYLENOL) 325 MG tablet Take 1-2 tablets (325-650 mg total) by mouth every 4 (four) hours as needed for mild pain.    [name] alum & mag hydroxide-simeth (MAALOX MULTI SYMPTOM MAX ST) 400-400-40 MG/5ML suspension Take 10 mLs by mouth every 6 (six) hours as needed for indigestion. 355 mL 0  . budesonide-formoterol (SYMBICORT) 80-4.5 MCG/ACT inhaler Inhale 2 puffs into the lungs 2 (two) times daily. 1 Inhaler 12  . cholecalciferol  (VITAMIN D) 1000 units tablet Take 1,000 Units by mouth 2 (two) times daily.    . collagenase (SANTYL) ointment Apply topically daily. To sacral area with damp to dry dressing. Change daily 15 g 0  . dexamethasone (DECADRON) 4 MG tablet Take 2 tablets (8 mg total) by mouth daily. Start the day after chemotherapy for 2 days. 30 tablet 1  . levETIRAcetam (KEPPRA) 500 MG tablet TK 1 T PO BID  6  . LORazepam (ATIVAN) 0.5 MG tablet Take 2 tablets (1 mg total) by mouth once as needed for up to 1 dose (prior to PET/CT and MRI brain for anxiety). 6 tablet 0  . magnesium chloride (SLOW-MAG) 64 MG TBEC SR tablet Take 2 tablets (128 mg total) by mouth daily. 60 tablet 1  . ondansetron (ZOFRAN) 8 MG tablet Take 1 tablet (8 mg total) by mouth 2 (two) times daily as needed for refractory nausea / vomiting. Start on day 3 after chemo. 30 tablet 1  . pantoprazole (PROTONIX) 40 MG tablet TK 1 T PO BID  6  . polycarbophil (FIBERCON) 625 MG tablet Take 2 tablets (1,250 mg total) by mouth 2 (two) times daily. 120 tablet 0  . potassium chloride SA (K-DUR,KLOR-CON) 20 MEQ tablet Take 1 tablet (20 mEq total) by mouth 2 (two) times daily. 60 tablet 0  . prochlorperazine (COMPAZINE) 10 MG tablet Take 1 tablet (10 mg total) by mouth every 6 (six) hours as needed (Nausea or vomiting). 30 tablet 1  . saccharomyces boulardii (FLORASTOR) 250 MG capsule Take 1 capsule (250 mg total) by mouth 2 (two) times daily. 100 capsule 0   No current facility-administered medications for this visit.     REVIEW OF SYSTEMS:   A 10+ POINT REVIEW OF SYSTEMS WAS OBTAINED including neurology, dermatology, psychiatry, cardiac, respiratory, lymph, extremities, GI, GU, Musculoskeletal, constitutional, breasts, reproductive, HEENT.  All pertinent positives are noted in the HPI.  All others are negative.   PHYSICAL EXAMINATION: ECOG PERFORMANCE STATUS: 1 - Symptomatic but completely ambulatory  Vitals:   08/20/17 0831  BP: (!) 159/60  Pulse: 74   Resp: 18  Temp: 98  F (36.7 C)  SpO2: 98%   Filed Weights   08/20/17 0831  Weight: 133 lb 12.8 oz (60.7 kg)   .Body mass index is 23.7 kg/m.  GENERAL:alert, in no acute distress and comfortable SKIN: no acute rashes, no significant lesions EYES: conjunctiva are pink and non-injected, sclera anicteric OROPHARYNX: MMM, no exudates, no oropharyngeal erythema or ulceration NECK: supple, no JVD LYMPH:  no palpable lymphadenopathy in the cervical, axillary or inguinal regions LUNGS: clear to auscultation b/l with normal respiratory effort HEART: regular rate & rhythm ABDOMEN:  normoactive bowel sounds , non tender, not distended. Extremity: no pedal edema PSYCH: alert & oriented x 3 with fluent speech NEURO: no focal motor/sensory deficits   LABORATORY DATA:  I have reviewed the data as listed  . CBC Latest Ref Rng & Units 08/05/2017 07/27/2017 07/14/2017  WBC 3.9 - 10.3 K/uL 6.6 12.4(H) 7.1  Hemoglobin 11.6 - 15.9 g/dL 11.2(L) 11.3(L) 13.1  Hematocrit 34.8 - 46.6 % 34.9 34.9(L) 40.9  Platelets 145 - 400 K/uL 155 145(L) 204    . CMP Latest Ref Rng & Units 08/05/2017 07/14/2017 06/30/2017  Glucose 70 - 140 mg/dL 95 80 90  BUN 7 - 26 mg/dL _0 Creatinine 0.60 - 1.10 mg/dL 0.71 0.73 0.77  Sodium 136 - 145 mmol/L 143 143 142  Potassium 3.5 - 5.1 mmol/L 3.6 3.5 4.5  Chloride 98 - 109 mmol/L 108 110(H) 107  CO2 22 - 29 mmol/L _1 Calcium 8.4 - 10.4 mg/dL 9.7 10.2 9.6  Total Protein 6.4 - 8.3 g/dL 6.9 7.6 6.3(L)  Total Bilirubin 0.2 - 1.2 mg/dL 0.2 0.2 <0.2(L)  Alkaline Phos 40 - 150 U/L 83 82 101  AST 5 - 34 U/L _2 ALT 0 - 55 U/L _3 05/19/17 Fine Needle Aspiration:  05/19/17 Bronchial Washing Specimen B:   RADIOGRAPHIC STUDIES: I have personally reviewed the radiological images as listed and agreed with the findings in the report. Mr Jeri Cos Wo Contrast  Result Date: 08/19/2017 CLINICAL DATA:  Non-small cell lung cancer staging. EXAM: MRI HEAD  WITHOUT AND WITH CONTRAST TECHNIQUE: Multiplanar, multiecho pulse sequences of the brain and surrounding structures were obtained without and with intravenous contrast. CONTRAST:  87m MULTIHANCE GADOBENATE DIMEGLUMINE 529 MG/ML IV SOLN COMPARISON:  04/21/2017 FINDINGS: Brain: There is no evidence of acute infarct, intracranial hemorrhage, mass, midline shift, or extra-axial fluid collection. The brain is normal in signal, with the small regions of T2/FLAIR hyperintensity in the parietal lobes on the prior MRI having resolved. No abnormal enhancement is identified within limitations of mild motion artifact. Mild cerebral atrophy is unchanged and not necessarily greater than expected for patient's age. Vascular: Major intracranial vascular flow voids are preserved. Skull and upper cervical spine: Unremarkable bone marrow signal. Sinuses/Orbits: Unremarkable orbits. Paranasal sinuses and mastoid air cells are clear. Other: None. IMPRESSION: 1. No evidence of intracranial metastases. 2. Interval resolution of bilateral parietal signal abnormality favoring PRES. Electronically Signed   By: ALogan BoresM.D.   On: 08/19/2017 16:32   Nm Pet Image Restag (ps) Skull Base To Thigh  Result Date: 08/19/2017 CLINICAL DATA:  Subsequent treatment strategy for LUNG CANCER. EXAM: NUCLEAR MEDICINE PET SKULL BASE TO THIGH TECHNIQUE: 6.4 mCi F-18 FDG was injected intravenously. Full-ring PET imaging was performed from the skull base to thigh after the radiotracer. CT data was obtained and used for attenuation correction and anatomic localization. Fasting blood glucose: 94 mg/dl COMPARISON:  05/14/2017 FINDINGS: Mediastinal blood pool activity: SUV max 94 NECK: Focus of increased uptake within the distal aspect of the tongue has an SUV max of 16.46. On the previous exam the SUV max was equal to 17.3. Incidental CT findings: none CHEST: The mass centered around the AP window is again noted. This measures 3.2 by 4.4 cm and has an SUV  max of 14.04. Previously 3.1 x 4.3 cm within SUV max of 11.0. Previously noted left upper lobe ground-glass opacity has resolved in the interval compatible with an inflammatory or infectious process. Incidental CT findings: Moderate changes of centrilobular emphysema. Aortic atherosclerosis noted. Calcifications in the RCA, LAD and left circumflex coronary artery noted. ABDOMEN/PELVIS: No focal liver abnormality. The pancreas and spleen are unremarkable. Normal appearance of the adrenal glands. No hypermetabolic lymph nodes identified. Incidental CT findings: Aortic atherosclerosis. A filter is identified within the IVC. Aortic atherosclerosis without aneurysm. SKELETON: Interval decrease in radiotracer uptake throughout the axial and appendicular skeleton which is favored to represent treatment related changes. No suspicious bone lesions identified. Incidental CT findings: none IMPRESSION: 1. No significant change in size of hypermetabolic AP window mass. The degree of FDG uptake is slightly increased in the interval. 2. Persistent hypermetabolic focus involving the distal aspect of the tongue within SUV max of 16.46. Similar to previous exam. Correlation with direct visualization is advised to exclude head/neck malignancy. 3.  Emphysema (ICD10-J43.9). 4. Aortic atherosclerosis and 3 vessel coronary artery atherosclerotic calcifications. Aortic Atherosclerosis (ICD10-I70.0). Electronically Signed   By: Kerby Moors M.D.   On: 08/19/2017 15:57   Ir US Guide Vasc Access Right  Result Date: 07/27/2017 INDICATION: History of lung cancer. In need of durable intravenous access for chemotherapy administration. EXAM: IMPLANTED PORT A CATH PLACEMENT WITH ULTRASOUND AND FLUOROSCOPIC GUIDANCE COMPARISON:  PET-CT - 05/14/2017 MEDICATIONS: Ancef 2 gm IV; The antibiotic was administered within an appropriate time interval prior to skin puncture. ANESTHESIA/SEDATION: Moderate (conscious) sedation was employed during this  procedure. A total of Versed 3 mg and Fentanyl 150 mcg was administered intravenously. Moderate Sedation Time: 27 minutes. The patient's level of consciousness and vital signs were monitored continuously by radiology nursing throughout the procedure under my direct supervision. CONTRAST:  None FLUOROSCOPY TIME:  12 seconds (2 mGy) COMPLICATIONS: None immediate. PROCEDURE: The procedure, risks, benefits, and alternatives were explained to the patient. Questions regarding the procedure were encouraged and answered. The patient understands and consents to the procedure. The right neck and chest were prepped with chlorhexidine in a sterile fashion, and a sterile drape was applied covering the operative field. Maximum barrier sterile technique with sterile gowns and gloves were used for the procedure. A timeout was performed prior to the initiation of the procedure. Local anesthesia was provided with 1% lidocaine with epinephrine. After creating a small venotomy incision, a micropuncture kit was utilized to access the internal jugular vein. Real-time ultrasound guidance was utilized for vascular access including the acquisition of a permanent ultrasound image documenting patency of the accessed vessel. The microwire was utilized to measure appropriate catheter length. A subcutaneous port pocket was then created along the upper chest wall utilizing a combination of sharp and blunt dissection. The pocket was irrigated with sterile saline. A single lumen thin power injectable port was chosen for placement. The 8 Fr catheter was tunneled from the port pocket site to the venotomy incision. The port was placed in the pocket. The external catheter was trimmed to appropriate length. At the venotomy, an 8 Fr peel-away sheath  was placed over a guidewire under fluoroscopic guidance. The catheter was then placed through the sheath and the sheath was removed. Final catheter positioning was confirmed and documented with a fluoroscopic  spot radiograph. The port was accessed with a Huber needle, aspirated and flushed with heparinized saline. The venotomy site was closed with an interrupted 4-0 Vicryl suture. The port pocket incision was closed with interrupted 2-0 Vicryl suture and the skin was opposed with a running subcuticular 4-0 Vicryl suture. Dermabond and Steri-strips were applied to both incisions. Dressings were placed. The patient tolerated the procedure well without immediate post procedural complication. FINDINGS: After catheter placement, the tip lies within the superior cavoatrial junction. The catheter aspirates and flushes normally and is ready for immediate use. IMPRESSION: Successful placement of a right internal jugular approach power injectable Port-A-Cath. The catheter is ready for immediate use. Electronically Signed   By: Sandi Mariscal M.D.   On: 07/27/2017 14:51   Ir Fluoro Guide Port Insertion Right  Result Date: 07/27/2017 INDICATION: History of lung cancer. In need of durable intravenous access for chemotherapy administration. EXAM: IMPLANTED PORT A CATH PLACEMENT WITH ULTRASOUND AND FLUOROSCOPIC GUIDANCE COMPARISON:  PET-CT - 05/14/2017 MEDICATIONS: Ancef 2 gm IV; The antibiotic was administered within an appropriate time interval prior to skin puncture. ANESTHESIA/SEDATION: Moderate (conscious) sedation was employed during this procedure. A total of Versed 3 mg and Fentanyl 150 mcg was administered intravenously. Moderate Sedation Time: 27 minutes. The patient's level of consciousness and vital signs were monitored continuously by radiology nursing throughout the procedure under my direct supervision. CONTRAST:  None FLUOROSCOPY TIME:  12 seconds (2 mGy) COMPLICATIONS: None immediate. PROCEDURE: The procedure, risks, benefits, and alternatives were explained to the patient. Questions regarding the procedure were encouraged and answered. The patient understands and consents to the procedure. The right neck and chest  were prepped with chlorhexidine in a sterile fashion, and a sterile drape was applied covering the operative field. Maximum barrier sterile technique with sterile gowns and gloves were used for the procedure. A timeout was performed prior to the initiation of the procedure. Local anesthesia was provided with 1% lidocaine with epinephrine. After creating a small venotomy incision, a micropuncture kit was utilized to access the internal jugular vein. Real-time ultrasound guidance was utilized for vascular access including the acquisition of a permanent ultrasound image documenting patency of the accessed vessel. The microwire was utilized to measure appropriate catheter length. A subcutaneous port pocket was then created along the upper chest wall utilizing a combination of sharp and blunt dissection. The pocket was irrigated with sterile saline. A single lumen thin power injectable port was chosen for placement. The 8 Fr catheter was tunneled from the port pocket site to the venotomy incision. The port was placed in the pocket. The external catheter was trimmed to appropriate length. At the venotomy, an 8 Fr peel-away sheath was placed over a guidewire under fluoroscopic guidance. The catheter was then placed through the sheath and the sheath was removed. Final catheter positioning was confirmed and documented with a fluoroscopic spot radiograph. The port was accessed with a Huber needle, aspirated and flushed with heparinized saline. The venotomy site was closed with an interrupted 4-0 Vicryl suture. The port pocket incision was closed with interrupted 2-0 Vicryl suture and the skin was opposed with a running subcuticular 4-0 Vicryl suture. Dermabond and Steri-strips were applied to both incisions. Dressings were placed. The patient tolerated the procedure well without immediate post procedural complication. FINDINGS: After catheter placement, the  tip lies within the superior cavoatrial junction. The catheter  aspirates and flushes normally and is ready for immediate use. IMPRESSION: Successful placement of a right internal jugular approach power injectable Port-A-Cath. The catheter is ready for immediate use. Electronically Signed   By: Sandi Mariscal M.D.   On: 07/27/2017 14:51   MRI brain 04/21/2017: IMPRESSION: 1. Motion degraded examination. 2. 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 3. Otherwise negative MRI of the head with and without contrast for age.  Electronically Signed   By: Elon Alas M.D.   On: 04/22/2017 00:34    ASSESSMENT & PLAN:   62 y.o. female with  1. Recently diagnosed Non-Small Cell Carcinoma on bronchoscopy/cytology. Not enough tissue for further characterization or mutation testing. Presenting with mediastinal nodal mass - TxN2 Mx (Atleast Stage III disease) ? Lingular primary vs inflammation. No overt evidence of metastatic disease.  -EGFR mutation studies on blood negative. Patient is s/p 3 cycles of Carboplatin/taxol with   2. Bone Pain from neulasta - managed with Tylenol  3. Recent SZ -currently on Keppra. MRI brain - resolution of findings of PRES  4. rt calf DVT - off anticoagulation due to massive GI bleeding. S/p IVC filter.  5. S/p Recent GI bleeding from Gastric ulcer --needing clipping and arterial embolization. hgb normal at 11.2 today. On PPI BID  6. Low Magnesium -- today at 1.6 (07/14/17) -On oral magnesium 247m BID  7. H/o ctx /steroid related thrush --now resolved.  PLAN  Discussed pt labwork from 08/05/17; magnesium was slightly low at 1.6, blood counts and chemistries were otherwise stable -Discussed and reviewed 08/19/17 PET with pt and her husband which revealed  No significant change in size of hypermetabolic AP window mass. The degree of FDG uptake is slightly increased in the interval. 2. Persistent hypermetabolic focus involving the distal aspect of the tongue within SUV  max of 16.46. Similar to previous exam. (no clinical co-relate). Lung involvement has decreased.  Plan -Discussed 08/19/17 Brain MRI which revealed No evidence of intracranial metastases. Interval resolution of bilateral parietal signal abnormality favoring PRES -Base of tongue involvement from PET could be from inflammation from noted dental concerns- no other overt clinical lesions noted. -Discussed that the interval findings from radiographic imaging is mixed and necessitate a decision of how to move forward with treatment: radiation vs chemo and radiation -Pt is clinically improved compared to before treatment; functioning well, not on oxygen, nutritional status improved. -Would suggest treating as Stage IIIB with chemotherapy and radiation, with maintenance immunotherapy afterwards -will setup for weekly carboplatin/taxol with concurrent RT -she will f/u with rad-onc for this. -Discussed that immunotherapy/Durvalumab may play a role after radiation -Patient's lung capacity revealed moderate diffusion problems in addition to moderate obstruction before treatment  -Will repeat PET 8-12 weeks after treatment completion -Continue staying well hydrated, walking 30 minutes a day, and maintaining nutrition   F/u with Dr MLisbeth Renshawradiation oncology to plan to start combined chemo-radiation Weekly carbo-taxol to start in 2weeks RTC with Dr KIrene Limboin 2 weeks with C1 of weekly carbo/taxol with labs   All of the patients questions were answered with apparent satisfaction. The patient knows to call the clinic with any problems, questions or concerns.  The toal time spent in the appt was 30 minutes and more than 50% was on counseling and direct patient cares.   GSullivan LoneMD MOtter TailAAHIVMS SHighlands Regional Medical CenterCSelect [facility]ty [facility] Spectrum HealthHematology/Oncology Physician [place] (Office):  [phone] (Work cell):  [phone] (Fax):           [phone]  08/20/2017 9:41 AM  I, Baldwin Jamaica, am acting as a Education administrator  for Dr Irene Limbo.   .I have reviewed the above documentation for accuracy and completeness, and I agree with the above. Brunetta Genera MD

## 2017-08-20 ENCOUNTER — Inpatient Hospital Stay: Payer: BLUE CROSS/BLUE SHIELD | Attending: Hematology | Admitting: Hematology

## 2017-08-20 ENCOUNTER — Telehealth: Payer: Self-pay

## 2017-08-20 ENCOUNTER — Encounter: Payer: Self-pay | Admitting: Hematology

## 2017-08-20 VITALS — BP 159/60 | HR 74 | Temp 98.0°F | Resp 18 | Ht 63.0 in | Wt 133.8 lb

## 2017-08-20 DIAGNOSIS — I10 Essential (primary) hypertension: Secondary | ICD-10-CM | POA: Diagnosis not present

## 2017-08-20 DIAGNOSIS — Z87891 Personal history of nicotine dependence: Secondary | ICD-10-CM | POA: Insufficient documentation

## 2017-08-20 DIAGNOSIS — C3490 Malignant neoplasm of unspecified part of unspecified bronchus or lung: Secondary | ICD-10-CM | POA: Insufficient documentation

## 2017-08-20 DIAGNOSIS — Z5111 Encounter for antineoplastic chemotherapy: Secondary | ICD-10-CM | POA: Diagnosis present

## 2017-08-20 DIAGNOSIS — Z7189 Other specified counseling: Secondary | ICD-10-CM

## 2017-08-20 DIAGNOSIS — I824Z1 Acute embolism and thrombosis of unspecified deep veins of right distal lower extremity: Secondary | ICD-10-CM | POA: Diagnosis not present

## 2017-08-20 DIAGNOSIS — M898X9 Other specified disorders of bone, unspecified site: Secondary | ICD-10-CM | POA: Diagnosis not present

## 2017-08-20 DIAGNOSIS — Z7901 Long term (current) use of anticoagulants: Secondary | ICD-10-CM | POA: Insufficient documentation

## 2017-08-20 NOTE — Progress Notes (Signed)
Thoracic Location of Tumor / Histology: Non-small cell carcinoma.  Started on seizure medication after having a seizure while in the hospital for a GI hemorrhage 04/02/17.  She underwent clipping of gastric ulcer and received multiple units PRBC but continued to have drop in H/H with bloody stool. NM GI scan was negative for bleeding and she underwent colonoscopy by Dr. [name] 1/31 showing diverticulosis but no signs of bleeding.    PET 06/10/3534: Hypermetabolic mediastinal mass, no peripheral lung mass identified. New patchy ground glass opacity in the lingula is mildly hypermetabolic and likely inflammatory.  Mild hydroureter on the left, similar to previous CT and possible related to pelvic floor laxity.  Bronchoscopy  Dr. Lanelle Bal 05/19/2017: biopsy of hypermetabolic mediastinal mass, revealed non-small cell carcinoma.  05/19/17 the pt had a Fine Needle Aspiration which revealed Non-Small cell lung carcinoma.    CXR 05/19/2017: mass in the middle mediastinum, subtle infiltrate left upper lobe.  CT head 04/19/2017: No acute infract.  MRI Brain 04/22/2017: mild biparietal signal abnormality suspicious for posterior reversible encephalopathic syndrome, otherwise negative MRI.  CT scan 03/2017: suggested enlarged AP window lymph node.   Tobacco/Marijuana/Snuff/ETOH use: Smoked for 35-40 years, 1/2 pack a day.  Quit smoking on 03/31/2017.  Past/Anticipated interventions by cardiothoracic surgery, if any:Dr. Martin Majestic   ADDITIONAL INFORMATION: 05-19-17 ADDENDUM: Immunohistochemistry is performed on the cell block and the malignant cells are negative with thyroid transcription factor-I and cytokeratin 5/6. The immunophenotype is nonspecific. (JDP:kh 05/22/17) Claudette Laws MD Pathologist, Electronic Signature ( Signed 05/25/2017) Adequacy Reason Satisfactory For Evaluation.  Diagnosis 05-19-17  FINENEEDLE ASPIRATION, EBUS, 5L, A (SPECIMEN 1 OF 2, COLLECTED  05/19/17): NON-SMALL CELL CARCINOMA. SEE COMMENT. Preliminary Diagnosis MALIGNANT CELLS PRESENT NON-SMALL CELL CARCINOMA (DLB) Claudette Laws MD Pathologist, Electronic Signature (Case signed 05/21/2017) Specimen Clinical Information Mediastinal mass Source Fine Needle Aspiration, Endoscopic, Specimen A, EBUS 5L Node, (Specimen 1 of 2, collected on 05/19/2017)   Diagnosis 05-19-17  BRONCHIAL WASHING SPECIMEN B (SPECIMEN 2 OF 2, COLLECTED ON 05/19/2017): NO MALIGNANT CELLS IDENTIFIED.  Bronchoscopy  Dr. Lanelle Bal 05/19/2017: biopsy of hypermetabolic mediastinal mass, revealed non-small cell carcinoma.    05/15/2017 [name]: Bronchoscopy ebus transbronchial biopsy of the left hilar mass 05/19/2017 with [name]   PET 03/13/4313: Hypermetabolic mediastinal mass, no peripheral lung mass identified. New patchy ground glass opacity in the lingula is mildly hypermetabolic and likely inflammatory.  Mild hydroureter on the left, similar to previous CT and possible related to pelvic floor laxity.    Past/Anticipated interventions by medical oncology, if any: Dr. Irene Limbo  IMPRESSION: 08-19-17 1. No significant change in size of hypermetabolic AP window mass. The degree of FDG uptake is slightly increased in the interval. 2. Persistent hypermetabolic focus involving the distal aspect of the tongue within SUV max of 16.46. Similar to previous exam. Correlation with direct visualization is advised to exclude head/neck malignancy. 3. Emphysema (ICD10-J43.9). 4. Aortic atherosclerosis and 3 vessel coronary artery atherosclerotic calcifications. Aortic Atherosclerosis   08-19-17 MRI Brain w wo contrast IMPRESSION: 1. No evidence of intracranial metastases. 2. Interval resolution of bilateral parietal signal abnormality favoring PRES.   Dr. Irene Limbo 06/01/2017 -Not surgical candidate as the primary mass is in the center of the chest close to important structures.  Evaluate cardiac and   06-02-17 Pulmonary Function Test -Referral to IR -Current treatment options would be radiation alone,  chemotherapy Carboplatin/paclitaxel treatment q3weeks x 3 cycles) followed by RT  -Radiation oncology referral -   Signs/Symptoms  Weight changes, if  any: Lost roughly 30 pounds over the course of 3 months during her hospital stay.  Respiratory complaints, if any: Shortness of breath when she walks long distances.  Started symbicort after hospital stay  Hemoptysis, if any:   Pain issues, if any:

## 2017-08-20 NOTE — Telephone Encounter (Signed)
Called and spoke with patient concerning upcoming appointment. Per 6/13 los, also informed her that a letter will be mailed to her with a calender enclosed concerning these appointment.

## 2017-08-25 ENCOUNTER — Ambulatory Visit: Payer: BLUE CROSS/BLUE SHIELD

## 2017-08-25 ENCOUNTER — Other Ambulatory Visit: Payer: BLUE CROSS/BLUE SHIELD

## 2017-08-25 ENCOUNTER — Ambulatory Visit: Payer: BLUE CROSS/BLUE SHIELD | Admitting: Hematology

## 2017-08-25 ENCOUNTER — Ambulatory Visit
Admission: RE | Admit: 2017-08-25 | Discharge: 2017-08-25 | Disposition: A | Discharge status: Home / Self Care | Payer: BLUE CROSS/BLUE SHIELD | Source: Ambulatory Visit | Attending: Radiation Oncology | Admitting: Radiation Oncology

## 2017-08-25 NOTE — Progress Notes (Deleted)
Thoracic Location of Tumor / Histology: Non-small cell carcinoma.  Started on seizure medication after having a seizure while in the hospital for a GI hemorrhage 04/02/17.  She underwent clipping of gastric ulcer and received multiple units PRBC but continued to have drop in H/H with bloody stool. NM GI scan was negative for bleeding and she underwent colonoscopy by Dr. [name] 1/31 showing diverticulosis but no signs of bleeding.    PET 0/10/6576: Hypermetabolic mediastinal mass, no peripheral lung mass identified. New patchy ground glass opacity in the lingula is mildly hypermetabolic and likely inflammatory.  Mild hydroureter on the left, similar to previous CT and possible related to pelvic floor laxity.  Bronchoscopy  Dr. Lanelle Bal 05/19/2017: biopsy of hypermetabolic mediastinal mass, revealed non-small cell carcinoma.  05/19/17 the pt had a Fine Needle Aspiration which revealed Non-Small cell lung carcinoma.    CXR 05/19/2017: mass in the middle mediastinum, subtle infiltrate left upper lobe.  CT head 04/19/2017: No acute infract.  MRI Brain 04/22/2017: mild biparietal signal abnormality suspicious for posterior reversible encephalopathic syndrome, otherwise negative MRI.  CT scan 03/2017: suggested enlarged AP window lymph node.   Tobacco/Marijuana/Snuff/ETOH use: Smoked for 35-40 years, 1/2 pack a day.  Quit smoking on 03/31/2017.  Past/Anticipated interventions by cardiothoracic surgery, if any:Dr. Martin Majestic   ADDITIONAL INFORMATION: 05-19-17 ADDENDUM: Immunohistochemistry is performed on the cell block and the malignant cells are negative with thyroid transcription factor-I and cytokeratin 5/6. The immunophenotype is nonspecific. (JDP:kh 05/22/17) Claudette Laws MD Pathologist, Electronic Signature ( Signed 05/25/2017) Adequacy Reason Satisfactory For Evaluation.  Diagnosis 05-19-17  FINENEEDLE ASPIRATION, EBUS, 5L, A (SPECIMEN 1 OF 2, COLLECTED  05/19/17): NON-SMALL CELL CARCINOMA. SEE COMMENT. Preliminary Diagnosis MALIGNANT CELLS PRESENT NON-SMALL CELL CARCINOMA (DLB) Claudette Laws MD Pathologist, Electronic Signature (Case signed 05/21/2017) Specimen Clinical Information Mediastinal mass Source Fine Needle Aspiration, Endoscopic, Specimen A, EBUS 5L Node, (Specimen 1 of 2, collected on 05/19/2017)   Diagnosis 05-19-17  BRONCHIAL WASHING SPECIMEN B (SPECIMEN 2 OF 2, COLLECTED ON 05/19/2017): NO MALIGNANT CELLS IDENTIFIED.  Bronchoscopy  Dr. Lanelle Bal 05/19/2017: biopsy of hypermetabolic mediastinal mass, revealed non-small cell carcinoma.    05/15/2017 [name]: Bronchoscopy ebus transbronchial biopsy of the left hilar mass 05/19/2017 with [name]   PET 06/14/9627: Hypermetabolic mediastinal mass, no peripheral lung mass identified. New patchy ground glass opacity in the lingula is mildly hypermetabolic and likely inflammatory.  Mild hydroureter on the left, similar to previous CT and possible related to pelvic floor laxity.    Past/Anticipated interventions by medical oncology, if any: Dr. Irene Limbo  IMPRESSION: 08-19-17 1. No significant change in size of hypermetabolic AP window mass. The degree of FDG uptake is slightly increased in the interval. 2. Persistent hypermetabolic focus involving the distal aspect of the tongue within SUV max of 16.46. Similar to previous exam. Correlation with direct visualization is advised to exclude head/neck malignancy. 3. Emphysema (ICD10-J43.9). 4. Aortic atherosclerosis and 3 vessel coronary artery atherosclerotic calcifications. Aortic Atherosclerosis   08-19-17 MRI Brain w wo contrast IMPRESSION: 1. No evidence of intracranial metastases. 2. Interval resolution of bilateral parietal signal abnormality favoring PRES.   Dr. Irene Limbo 06/01/2017 -Not surgical candidate as the primary mass is in the center of the chest close to important structures.  Evaluate cardiac and   06-02-17 Pulmonary Function Test -Referral to IR -Current treatment options would be radiation alone,  chemotherapy Carboplatin/paclitaxel treatment q3weeks x 3 cycles) followed by RT  -Radiation oncology referral -   Signs/Symptoms  Weight changes, if  any: Lost roughly 30 pounds over the course of 3 months during her hospital stay.  Respiratory complaints, if any: Shortness of breath when she walks long distances.  Started symbicort after hospital stay  Hemoptysis, if any:   Pain issues, if any:    SAFETY ISSUES:  Prior radiation?No  Pacemaker/ICD?No  Possiblecurrent pregnancy?No  Is the patient on methotrexate?No  Current Complaints / other details:Newly diagnosed non small cell carcinoma. Recent seizure while hospitalized started on Keppra, recent right calf DVT started on xarelto, xarelto stopped due to massive GI bleeding, arterial embolization and clipping completed, s/p IVC filter.

## 2017-08-26 MED ORDER — LIDOCAINE-PRILOCAINE 2.5-2.5 % EX CREA: TOPICAL_CREAM | CUTANEOUS | 3 refills | Status: DC

## 2017-08-26 NOTE — Progress Notes (Signed)
Thoracic Location of Tumor / Histology: Non-small cell carcinoma.  Started on seizure medication after having a seizure while in the hospital for a GI hemorrhage 04/02/17.  She underwent clipping of gastric ulcer and received multiple units PRBC but continued to have drop in H/H with bloody stool. NM GI scan was negative for bleeding and she underwent colonoscopy by Dr. [name] 1/31 showing diverticulosis but no signs of bleeding.    PET 08/10/6946: Hypermetabolic mediastinal mass, no peripheral lung mass identified. New patchy ground glass opacity in the lingula is mildly hypermetabolic and likely inflammatory.  Mild hydroureter on the left, similar to previous CT and possible related to pelvic floor laxity.  Bronchoscopy  Dr. Lanelle Bal 05/19/2017: biopsy of hypermetabolic mediastinal mass, revealed non-small cell carcinoma.  05/19/17 the pt had a Fine Needle Aspiration which revealed Non-Small cell lung carcinoma.    CXR 05/19/2017: mass in the middle mediastinum, subtle infiltrate left upper lobe.  CT head 04/19/2017: No acute infract.  MRI Brain 04/22/2017: mild biparietal signal abnormality suspicious for posterior reversible encephalopathic syndrome, otherwise negative MRI.  CT scan 03/2017: suggested enlarged AP window lymph node.   Tobacco/Marijuana/Snuff/ETOH use: Smoked for 35-40 years, 1/2 pack a day.  Quit smoking on 03/31/2017.  Past/Anticipated interventions by cardiothoracic surgery, if any:Dr. Martin Majestic   ADDITIONAL INFORMATION: 05-19-17 ADDENDUM: Immunohistochemistry is performed on the cell block and the malignant cells are negative with thyroid transcription factor-I and cytokeratin 5/6. The immunophenotype is nonspecific. (JDP:kh 05/22/17) Claudette Laws MD Pathologist, Electronic Signature ( Signed 05/25/2017) Adequacy Reason Satisfactory For Evaluation.  Diagnosis 05-19-17  FINENEEDLE ASPIRATION, EBUS, 5L, A (SPECIMEN 1 OF 2, COLLECTED  05/19/17): NON-SMALL CELL CARCINOMA. SEE COMMENT. Preliminary Diagnosis MALIGNANT CELLS PRESENT NON-SMALL CELL CARCINOMA (DLB) Claudette Laws MD Pathologist, Electronic Signature (Case signed 05/21/2017) Specimen Clinical Information Mediastinal mass Source Fine Needle Aspiration, Endoscopic, Specimen A, EBUS 5L Node, (Specimen 1 of 2, collected on 05/19/2017)   Diagnosis 05-19-17  BRONCHIAL WASHING SPECIMEN B (SPECIMEN 2 OF 2, COLLECTED ON 05/19/2017): NO MALIGNANT CELLS IDENTIFIED.  Bronchoscopy  Dr. Lanelle Bal 05/19/2017: biopsy of hypermetabolic mediastinal mass, revealed non-small cell carcinoma.    05/15/2017 [name]: Bronchoscopy ebus transbronchial biopsy of the left hilar mass 05/19/2017 with [name]   PET 07/11/6268: Hypermetabolic mediastinal mass, no peripheral lung mass identified. New patchy ground glass opacity in the lingula is mildly hypermetabolic and likely inflammatory.  Mild hydroureter on the left, similar to previous CT and possible related to pelvic floor laxity.    Past/Anticipated interventions by medical oncology, if any: Dr. Irene Limbo  IMPRESSION: 08-19-17 1. No significant change in size of hypermetabolic AP window mass. The degree of FDG uptake is slightly increased in the interval. 2. Persistent hypermetabolic focus involving the distal aspect of the tongue within SUV max of 16.46. Similar to previous exam. Correlation with direct visualization is advised to exclude head/neck malignancy. 3. Emphysema (ICD10-J43.9). 4. Aortic atherosclerosis and 3 vessel coronary artery atherosclerotic calcifications. Aortic Atherosclerosis   08-19-17 MRI Brain w wo contrast IMPRESSION: 1. No evidence of intracranial metastases. 2. Interval resolution of bilateral parietal signal abnormality favoring PRES.   Dr. Irene Limbo 06/01/2017 -Not surgical candidate as the primary mass is in the center of the chest close to important structures.  Evaluate cardiac and   06-02-17 Pulmonary Function Test -Referral to IR -Current treatment options would be radiation alone,  chemotherapy Carboplatin/paclitaxel treatment q3weeks x 3 cycles) followed by RT  -Radiation oncology referral -   Signs/Symptoms  Weight changes, if  any: Lost roughly 30 pounds over the course of 3 months during her hospital stay.  Respiratory complaints, if any: Shortness of breath when she walks long distances.  Started symbicort after hospital stay  Hemoptysis, if any:   Pain issues, if any:     SAFETY ISSUES:  Prior radiation? No  Pacemaker/ICD? No  Possible current pregnancy? No  Is the patient on methotrexate? No   Current Complaints / other details:  Newly diagnosed non small cell carcinoma. Recent seizure while hospitalized started on Keppra, recent right calf DVT started on xarelto, xarelto stopped due to massive GI bleeding, arterial embolization and clipping completed, s/p IVC filter

## 2017-08-26 NOTE — Progress Notes (Signed)
START ON PATHWAY REGIMEN - Non-Small Cell Lung     Administer weekly:     Paclitaxel      Carboplatin   **Always confirm dose/schedule in your pharmacy ordering system**  Patient Characteristics: Stage III - Unresectable, PS = 0, 1 AJCC T Category: TX Current Disease Status: No Distant Mets or Local Recurrence AJCC N Category: NX AJCC M Category: M0 AJCC 8 Stage Grouping: IIIB Performance Status: PS = 0, 1 Intent of Therapy: Non-Curative / Palliative Intent, Discussed with Patient

## 2017-08-27 ENCOUNTER — Ambulatory Visit
Admission: RE | Admit: 2017-08-27 | Discharge: 2017-08-27 | Disposition: A | Discharge status: Home / Self Care | Payer: BLUE CROSS/BLUE SHIELD | Source: Ambulatory Visit | Attending: Radiation Oncology | Admitting: Radiation Oncology

## 2017-08-27 ENCOUNTER — Encounter: Payer: Self-pay | Admitting: Radiation Oncology

## 2017-08-27 ENCOUNTER — Ambulatory Visit: Payer: BLUE CROSS/BLUE SHIELD

## 2017-08-27 VITALS — BP 160/60 | HR 70 | Temp 98.2°F | Resp 20 | Ht 63.0 in | Wt 132.4 lb

## 2017-08-27 DIAGNOSIS — E559 Vitamin D deficiency, unspecified: Secondary | ICD-10-CM | POA: Insufficient documentation

## 2017-08-27 DIAGNOSIS — E7800 Pure hypercholesterolemia, unspecified: Secondary | ICD-10-CM | POA: Insufficient documentation

## 2017-08-27 DIAGNOSIS — D62 Acute posthemorrhagic anemia: Secondary | ICD-10-CM | POA: Diagnosis not present

## 2017-08-27 DIAGNOSIS — Z87891 Personal history of nicotine dependence: Secondary | ICD-10-CM | POA: Insufficient documentation

## 2017-08-27 DIAGNOSIS — I10 Essential (primary) hypertension: Secondary | ICD-10-CM | POA: Insufficient documentation

## 2017-08-27 DIAGNOSIS — Z79899 Other long term (current) drug therapy: Secondary | ICD-10-CM | POA: Diagnosis not present

## 2017-08-27 DIAGNOSIS — F419 Anxiety disorder, unspecified: Secondary | ICD-10-CM | POA: Insufficient documentation

## 2017-08-27 DIAGNOSIS — I481 Persistent atrial fibrillation: Secondary | ICD-10-CM | POA: Diagnosis not present

## 2017-08-27 DIAGNOSIS — Z7951 Long term (current) use of inhaled steroids: Secondary | ICD-10-CM | POA: Diagnosis not present

## 2017-08-27 DIAGNOSIS — C383 Malignant neoplasm of mediastinum, part unspecified: Secondary | ICD-10-CM

## 2017-08-27 DIAGNOSIS — C3490 Malignant neoplasm of unspecified part of unspecified bronchus or lung: Secondary | ICD-10-CM | POA: Insufficient documentation

## 2017-08-27 DIAGNOSIS — R569 Unspecified convulsions: Secondary | ICD-10-CM | POA: Insufficient documentation

## 2017-08-27 NOTE — Addendum Note (Signed)
Encounter addended by: Malena Edman, RN on: 08/27/2017 10:43 AM  Actions taken: Visit diagnoses modified

## 2017-08-27 NOTE — Progress Notes (Signed)
Thoracic Location of Tumor / Histology: Non-small cell carcinoma.  Started on seizure medication after having a seizure while in the hospital for a GI hemorrhage 04/02/17.  She underwent clipping of gastric ulcer and received multiple units PRBC but continued to have drop in H/H with bloody stool. NM GI scan was negative for bleeding and she underwent colonoscopy by Dr. [name] 1/31 showing diverticulosis but no signs of bleeding.    PET 4/0/9811: Hypermetabolic mediastinal mass, no peripheral lung mass identified. New patchy ground glass opacity in the lingula is mildly hypermetabolic and likely inflammatory.  Mild hydroureter on the left, similar to previous CT and possible related to pelvic floor laxity.  Bronchoscopy  Dr. Lanelle Bal 05/19/2017: biopsy of hypermetabolic mediastinal mass, revealed non-small cell carcinoma.  05/19/17 the pt had a Fine Needle Aspiration which revealed Non-Small cell lung carcinoma.    CXR 05/19/2017: mass in the middle mediastinum, subtle infiltrate left upper lobe.  CT head 04/19/2017: No acute infract.  MRI Brain 04/22/2017: mild biparietal signal abnormality suspicious for posterior reversible encephalopathic syndrome, otherwise negative MRI.  CT scan 03/2017: suggested enlarged AP window lymph node.   Tobacco/Marijuana/Snuff/ETOH use: Smoked for 35-40 years, 1/2 pack a day.  Quit smoking on 03/31/2017.  Past/Anticipated interventions by cardiothoracic surgery, if any:Dr. Martin Majestic   ADDITIONAL INFORMATION: 05-19-17 ADDENDUM: Immunohistochemistry is performed on the cell block and the malignant cells are negative with thyroid transcription factor-I and cytokeratin 5/6. The immunophenotype is nonspecific. (JDP:kh 05/22/17) Claudette Laws MD Pathologist, Electronic Signature ( Signed 05/25/2017) Adequacy Reason Satisfactory For Evaluation.  Diagnosis 05-19-17  FINENEEDLE ASPIRATION, EBUS, 5L, A (SPECIMEN 1 OF 2, COLLECTED  05/19/17): NON-SMALL CELL CARCINOMA. SEE COMMENT. Preliminary Diagnosis MALIGNANT CELLS PRESENT NON-SMALL CELL CARCINOMA (DLB) Claudette Laws MD Pathologist, Electronic Signature (Case signed 05/21/2017) Specimen Clinical Information Mediastinal mass Source Fine Needle Aspiration, Endoscopic, Specimen A, EBUS 5L Node, (Specimen 1 of 2, collected on 05/19/2017)   Diagnosis 05-19-17  BRONCHIAL WASHING SPECIMEN B (SPECIMEN 2 OF 2, COLLECTED ON 05/19/2017): NO MALIGNANT CELLS IDENTIFIED.  Bronchoscopy  Dr. Lanelle Bal 05/19/2017: biopsy of hypermetabolic mediastinal mass, revealed non-small cell carcinoma.    05/15/2017 [name]: Bronchoscopy ebus transbronchial biopsy of the left hilar mass 05/19/2017 with [name]   PET 11/08/4780: Hypermetabolic mediastinal mass, no peripheral lung mass identified. New patchy ground glass opacity in the lingula is mildly hypermetabolic and likely inflammatory.  Mild hydroureter on the left, similar to previous CT and possible related to pelvic floor laxity.    Past/Anticipated interventions by medical oncology, if any: Dr. Irene Limbo  IMPRESSION: 08-19-17 1. No significant change in size of hypermetabolic AP window mass. The degree of FDG uptake is slightly increased in the interval. 2. Persistent hypermetabolic focus involving the distal aspect of the tongue within SUV max of 16.46. Similar to previous exam. Correlation with direct visualization is advised to exclude head/neck malignancy. 3. Emphysema (ICD10-J43.9). 4. Aortic atherosclerosis and 3 vessel coronary artery atherosclerotic calcifications. Aortic Atherosclerosis   08-19-17 MRI Brain w wo contrast IMPRESSION: 1. No evidence of intracranial metastases. 2. Interval resolution of bilateral parietal signal abnormality favoring PRES.   Dr. Irene Limbo 06/01/2017 -Not surgical candidate as the primary mass is in the center of the chest close to important structures.  Evaluate cardiac and   06-02-17 Pulmonary Function Test -Referral to IR -Current treatment options would be radiation alone,  chemotherapy Carboplatin/paclitaxel treatment q3weeks x 3 cycles) followed by RT  -Radiation oncology referral   Signs/Symptoms  Weight changes, if any:  Gained 1.4 pounds since her last appointment;Lost roughly 30 pounds over the course of 3 months during her hospital stay.  Respiratory complaints, if any: Shortness of breath when she walks long distances has inproved.   Coughing clear secretion on symbicort, denies wheezing  Hemoptysis, if any: No  Pain issues, if any: No    SAFETY ISSUES:Fell August 11, 2017 got after her chemotherapy injection. Dizzy after   Prior radiation? No  Pacemaker/ICD? No  Possible current pregnancy? No  Is the patient on methotrexate? No   Current Complaints / other details:  Newly diagnosed non small cell carcinoma. Recent seizure while hospitalized started on Keppra, recent right calf DVT started on xarelto, xarelto stopped due to massive GI bleeding, arterial embolization and clipping completed, s/p IVC filter  No recent seizure activity on keppra. Wt Readings from Last 3 Encounters:  08/27/17 132 lb 6.4 oz (60.1 kg)  08/20/17 133 lb 12.8 oz (60.7 kg)  08/05/17 130 lb 6.4 oz (59.1 kg)  BP (!) 160/60 (BP Location: Right Arm, Patient Position: Sitting, Cuff Size: Normal)   Pulse 70   Temp 98.2 F (36.8 C) (Oral)   Resp 20   Ht 5\' 3"  (1.6 m)   Wt 132 lb 6.4 oz (60.1 kg)   SpO2 94%   BMI 23.45 kg/m

## 2017-08-27 NOTE — Progress Notes (Signed)
Radiation Oncology         [phone] ________________________________  Name: Kayla Wyatt        MRN: [id number]  Date of Service: 08/27/2017 DOB: Mar 27, 1955  QQ:[id number], Gwyndolyn Saxon, MD  Brunetta Genera, MD     REFERRING PHYSICIAN: Brunetta Genera, MD   DIAGNOSIS: The encounter diagnosis was Non-small cell lung cancer, unspecified laterality ([place]).   HISTORY OF PRESENT ILLNESS: Kayla Wyatt is a 62 y.o. female seen at the request of Dr. Irene Limbo for a new diagnosis of lung cancer. The patient presented to the hospital on 04/02/17 with melena  and hematemesis. She had a prolonged course until her discharge on 04/22/17. During her stay she was found to have a DVT, mediastinal mass in the AP window measuring 4.1 cm. She had ecoli bacteremia and respiratory failure requiring intubation. She had acute blood loss anemia from a GI bleed and received multiple units of blood and clipping of a GI ulcer, as well and A Fib with RVR. On  2/11 she had a seizure and MRI of the Brain on 04/22/2017 revealed a mild biparietal signal abnormality suspicious for posterior reversible encephalopathic syndrome, otherwise negative MRI.  Following her discharge, she was at inpatient rehabilitation and was discharged home on 04/30/17. A PET scan on 05/14/2017 revealed a known mediastinal mass is intensely hypermetabolic with an SUV of 17, worrisome for malignancy. Also, a new patchy ground-glass opacity in the lingula is mildly hypermetabolic and likely inflammatory with an SUV of 3.9, measuring 2.3 cm. Intense activity associated with the anterior oral cavity, probably physiologic. She underwent a bronchoscopy on 05/19/2017 by [name] and biopsy showed a hypermetabolic mediastinal mass, revealed non-small cell carcinoma.      She went on to begin taxol/carboplatin chemotherapy and has received 3 cycles to date. Her repeat imaging on 08/19/17 with MRI of the brain revealed no metastatic disease and resolution of  PRES. She also had another PET scan that day that revealed the AP window mass measuring 3.2 x 4.4 cm and the previously noted left upper lobe ground-glass opacity was resolved. Persistent change in the distal tongue was seen similar to previous though this is not felt to be a malignant process per Dr. Grier Mitts notes. She comes today to discuss options of chemoRT. She is scheduled to begin the weekly regimen of chemotherapy on 09/03/17.  PREVIOUS RADIATION THERAPY: No   PAST MEDICAL HISTORY:  Past Medical History:  Diagnosis Date  . Acute blood loss anemia 04/02/2017  . Anxiety   . Gastrointestinal hemorrhage with melena   . Headache   . Hypercholesteremia   . Hypertension   . Persistent atrial fibrillation with rapid ventricular response ([place]) 04/07/2017  . Seizures ([place])    04/2017 while in hospital at [facility]  . Vitamin D deficiency        PAST SURGICAL HISTORY: Past Surgical History:  Procedure Laterality Date  . COLONOSCOPY Left 04/09/2017   Procedure: COLONOSCOPY;  Surgeon: Carol Ada, MD;  Location: [place];  Service: Endoscopy;  Laterality: Left;  . ESOPHAGOGASTRODUODENOSCOPY N/A 04/03/2017   Procedure: ESOPHAGOGASTRODUODENOSCOPY (EGD);  Surgeon: Carol Ada, MD;  Location: [place];  Service: Endoscopy;  Laterality: N/A;  . ESOPHAGOGASTRODUODENOSCOPY (EGD) WITH PROPOFOL N/A 04/12/2017   Procedure: ESOPHAGOGASTRODUODENOSCOPY (EGD) WITH PROPOFOL;  Surgeon: Milus Banister, MD;  Location: [facility] ENDOSCOPY;  Service: Endoscopy;  Laterality: N/A;  . IR ANGIOGRAM SELECTIVE EACH ADDITIONAL VESSEL  04/12/2017  . IR ANGIOGRAM SELECTIVE EACH ADDITIONAL VESSEL  04/13/2017  . IR  ANGIOGRAM SELECTIVE EACH ADDITIONAL VESSEL  04/13/2017  . IR ANGIOGRAM SELECTIVE EACH ADDITIONAL VESSEL  04/13/2017  . IR ANGIOGRAM VISCERAL SELECTIVE  04/12/2017  . IR ANGIOGRAM VISCERAL SELECTIVE  04/13/2017  . IR EMBO ART  VEN HEMORR LYMPH EXTRAV  INC GUIDE ROADMAPPING  04/12/2017  . IR EMBO ART  VEN HEMORR LYMPH EXTRAV   INC GUIDE ROADMAPPING  04/13/2017  . IR FLUORO GUIDE CV LINE RIGHT  04/13/2017  . IR FLUORO GUIDE PORT INSERTION RIGHT  07/27/2017  . IR IVC FILTER PLMT / S&I /IMG GUID/MOD SED  04/12/2017  . IR US GUIDE VASC ACCESS RIGHT  04/12/2017  . IR US GUIDE VASC ACCESS RIGHT  04/13/2017  . IR US GUIDE VASC ACCESS RIGHT  04/13/2017  . IR US GUIDE VASC ACCESS RIGHT  07/27/2017  . LEG SURGERY  1974   Blood Clot Removal   . VIDEO BRONCHOSCOPY WITH ENDOBRONCHIAL ULTRASOUND N/A 05/19/2017   Procedure: VIDEO BRONCHOSCOPY WITH ENDOBRONCHIAL ULTRASOUND;  Surgeon: Grace Isaac, MD;  Location: [place];  Service: Thoracic;  Laterality: N/A;     FAMILY HISTORY:  Family History  Problem Relation Age of Onset  . Heart disease Father      SOCIAL HISTORY:  reports that she quit smoking about 4 months ago. Her smoking use included cigarettes. She has a 20.00 pack-year smoking history. She has never used smokeless tobacco. She reports that she does not drink alcohol or use drugs. The patient is married and lives in [place]. She is out of work due to her previous hospitalization.   ALLERGIES: Patient has no known allergies.   MEDICATIONS:  Current Outpatient Medications  Medication Sig Dispense Refill  . acetaminophen (TYLENOL) 325 MG tablet Take 1-2 tablets (325-650 mg total) by mouth every 4 (four) hours as needed for mild pain.    [name] alum & mag hydroxide-simeth (MAALOX MULTI SYMPTOM MAX ST) 400-400-40 MG/5ML suspension Take 10 mLs by mouth every 6 (six) hours as needed for indigestion. 355 mL 0  . budesonide-formoterol (SYMBICORT) 80-4.5 MCG/ACT inhaler Inhale 2 puffs into the lungs 2 (two) times daily. 1 Inhaler 12  . cholecalciferol (VITAMIN D) 1000 units tablet Take 1,000 Units by mouth 2 (two) times daily.    [name] dexamethasone (DECADRON) 4 MG tablet Take 2 tablets (8 mg total) by mouth daily. Start the day after chemotherapy for 2 days. 30 tablet 1  . levETIRAcetam (KEPPRA) 500 MG tablet TK 1 T PO BID  6  .  lidocaine-prilocaine (EMLA) cream Apply to affected area once 30 g 3  . LORazepam (ATIVAN) 0.5 MG tablet Take 2 tablets (1 mg total) by mouth once as needed for up to 1 dose (prior to PET/CT and MRI brain for anxiety). 6 tablet 0  . magnesium chloride (SLOW-MAG) 64 MG TBEC SR tablet Take 2 tablets (128 mg total) by mouth daily. 60 tablet 1  . pantoprazole (PROTONIX) 40 MG tablet TK 1 T PO BID  6  . polycarbophil (FIBERCON) 625 MG tablet Take 2 tablets (1,250 mg total) by mouth 2 (two) times daily. 120 tablet 0  . potassium chloride SA (K-DUR,KLOR-CON) 20 MEQ tablet Take 1 tablet (20 mEq total) by mouth 2 (two) times daily. 60 tablet 0  . saccharomyces boulardii (FLORASTOR) 250 MG capsule Take 1 capsule (250 mg total) by mouth 2 (two) times daily. 100 capsule 0  . ondansetron (ZOFRAN) 8 MG tablet Take 1 tablet (8 mg total) by mouth 2 (two) times daily as needed for refractory  nausea / vomiting. Start on day 3 after chemo. (Patient not taking: Reported on 08/27/2017) 30 tablet 1  . prochlorperazine (COMPAZINE) 10 MG tablet Take 1 tablet (10 mg total) by mouth every 6 (six) hours as needed (Nausea or vomiting). (Patient not taking: Reported on 08/27/2017) 30 tablet 1   No current facility-administered medications for this encounter.      REVIEW OF SYSTEMS: On review of systems, the patient reports that she is doing well overall. She reports occasional cough but denies any hemoptysis. She denies any chest pain, shortness of breath,  fevers, chills, night sweats, unintended weight changes. She has maintained her nutrition and is walking regularly. She denies any bowel or bladder disturbances, and denies abdominal pain, nausea or vomiting. She denies any new musculoskeletal or joint aches or pains, new skin lesions or concerns. A complete review of systems is obtained and is otherwise negative.    PHYSICAL EXAM:  Wt Readings from Last 3 Encounters:  08/27/17 132 lb 6.4 oz (60.1 kg)  08/20/17 133 lb 12.8  oz (60.7 kg)  08/05/17 130 lb 6.4 oz (59.1 kg)   Temp Readings from Last 3 Encounters:  08/27/17 98.2 F (36.8 C) (Oral)  08/20/17 98 F (36.7 C) (Oral)  08/07/17 98 F (36.7 C) (Oral)   BP Readings from Last 3 Encounters:  08/27/17 (!) 160/60  08/20/17 (!) 159/60  08/07/17 (!) 121/54   Pulse Readings from Last 3 Encounters:  08/27/17 70  08/20/17 74  08/07/17 (!) 50   Pain Assessment Pain Score: 0-No pain/10  In general this is a well appearing caucasian female in no acute distress. She's alert and oriented x4 and appropriate throughout the examination. Cardiopulmonary assessment is negative for acute distress and she exhibits normal effort. She has rolling movement of her mouth and jaw during discussion that she appears to be able to control.     ECOG = 0  0 - Asymptomatic (Fully active, able to carry on all predisease activities without restriction)  1 - Symptomatic but completely ambulatory (Restricted in physically strenuous activity but ambulatory and able to carry out work of a light or sedentary nature. For example, light housework, office work)  2 - Symptomatic, <50% in bed during the day (Ambulatory and capable of all self care but unable to carry out any work activities. Up and about more than 50% of waking hours)  3 - Symptomatic, >50% in bed, but not bedbound (Capable of only limited self-care, confined to bed or chair 50% or more of waking hours)  4 - Bedbound (Completely disabled. Cannot carry on any self-care. Totally confined to bed or chair)  5 - Death   Eustace Pen MM, Creech RH, Tormey DC, et al. [phone]). "Toxicity and response criteria of the [facility]". [place] Oncol. 5 (6): 649-55    LABORATORY DATA:  Lab Results  Component Value Date   WBC 6.6 08/05/2017   HGB 11.2 (L) 08/05/2017   HCT 34.9 08/05/2017   MCV 90.2 08/05/2017   PLT 155 08/05/2017   Lab Results  Component Value Date   NA 143 08/05/2017   K 3.6  08/05/2017   CL 108 08/05/2017   CO2 25 08/05/2017   Lab Results  Component Value Date   ALT 24 08/05/2017   AST 21 08/05/2017   ALKPHOS 83 08/05/2017   BILITOT 0.2 08/05/2017      RADIOGRAPHY: Mr Jeri Cos FG Contrast  Result Date: 08/19/2017 CLINICAL DATA:  Non-small cell lung  cancer staging. EXAM: MRI HEAD WITHOUT AND WITH CONTRAST TECHNIQUE: Multiplanar, multiecho pulse sequences of the brain and surrounding structures were obtained without and with intravenous contrast. CONTRAST:  51mL MULTIHANCE GADOBENATE DIMEGLUMINE 529 MG/ML IV SOLN COMPARISON:  04/21/2017 FINDINGS: Brain: There is no evidence of acute infarct, intracranial hemorrhage, mass, midline shift, or extra-axial fluid collection. The brain is normal in signal, with the small regions of T2/FLAIR hyperintensity in the parietal lobes on the prior MRI having resolved. No abnormal enhancement is identified within limitations of mild motion artifact. Mild cerebral atrophy is unchanged and not necessarily greater than expected for patient's age. Vascular: Major intracranial vascular flow voids are preserved. Skull and upper cervical spine: Unremarkable bone marrow signal. Sinuses/Orbits: Unremarkable orbits. Paranasal sinuses and mastoid air cells are clear. Other: None. IMPRESSION: 1. No evidence of intracranial metastases. 2. Interval resolution of bilateral parietal signal abnormality favoring PRES. Electronically Signed   By: Logan Bores M.D.   On: 08/19/2017 16:32   Nm Pet Image Restag (ps) Skull Base To Thigh  Result Date: 08/19/2017 CLINICAL DATA:  Subsequent treatment strategy for LUNG CANCER. EXAM: NUCLEAR MEDICINE PET SKULL BASE TO THIGH TECHNIQUE: 6.4 mCi F-18 FDG was injected intravenously. Full-ring PET imaging was performed from the skull base to thigh after the radiotracer. CT data was obtained and used for attenuation correction and anatomic localization. Fasting blood glucose: 94 mg/dl COMPARISON:  05/14/2017 FINDINGS:  Mediastinal blood pool activity: SUV max 94 NECK: Focus of increased uptake within the distal aspect of the tongue has an SUV max of 16.46. On the previous exam the SUV max was equal to 17.3. Incidental CT findings: none CHEST: The mass centered around the AP window is again noted. This measures 3.2 by 4.4 cm and has an SUV max of 14.04. Previously 3.1 x 4.3 cm within SUV max of 11.0. Previously noted left upper lobe ground-glass opacity has resolved in the interval compatible with an inflammatory or infectious process. Incidental CT findings: Moderate changes of centrilobular emphysema. Aortic atherosclerosis noted. Calcifications in the RCA, LAD and left circumflex coronary artery noted. ABDOMEN/PELVIS: No focal liver abnormality. The pancreas and spleen are unremarkable. Normal appearance of the adrenal glands. No hypermetabolic lymph nodes identified. Incidental CT findings: Aortic atherosclerosis. A filter is identified within the IVC. Aortic atherosclerosis without aneurysm. SKELETON: Interval decrease in radiotracer uptake throughout the axial and appendicular skeleton which is favored to represent treatment related changes. No suspicious bone lesions identified. Incidental CT findings: none IMPRESSION: 1. No significant change in size of hypermetabolic AP window mass. The degree of FDG uptake is slightly increased in the interval. 2. Persistent hypermetabolic focus involving the distal aspect of the tongue within SUV max of 16.46. Similar to previous exam. Correlation with direct visualization is advised to exclude head/neck malignancy. 3.  Emphysema (ICD10-J43.9). 4. Aortic atherosclerosis and 3 vessel coronary artery atherosclerotic calcifications. Aortic Atherosclerosis (ICD10-I70.0). Electronically Signed   By: Kerby Moors M.D.   On: 08/19/2017 15:57       IMPRESSION/PLAN: 1.         At least Stage III, TxN2Mx, NSCLC of the AP window with probable inflammatory change of the lingula. The patient  has undergone 3 cycles of Carboplatin/Taxol and repeat imaging reveals improvement of her infectious changes but persistence of her AP window disease, and no new disease. She is advised to proceed with a course of concurrent chemoRT.  We discussed the risks, benefits, short, and long term effects of radiotherapy, and the patient is interested  in proceeding at the appropriate interval. Dr. Lisbeth Renshaw discusses the delivery and logistics of radiotherapy and anticipates a course of 6 1/2 weeks.  Written consent was previously obtained and placed in the chart, a copy was provided to the patient during her prior visit and she reports she has this at home. We will plan to begin treatment 09/03/17 and she will return tomorrow for simulation. 2.         PRES. The patient had features of reversible encephalopathy and has been followed with repeat imaging on 08/19/17 which was reviewed yesterday in brain/spine oncology.  Her features of PRES have resolved and she does not have metastatic disease otherwise.  In a visit lasting 25 minutes, greater than 50% of the time was spent face to face discussing her case, and coordinating the patient's care.  The above documentation reflects my direct findings during this shared patient visit. Please see the separate note by Dr. Lisbeth Renshaw on this date for the remainder of the patient's plan of care.     Carola Rhine, PAC This document serves as a record of services personally performed by Shona Simpson, PA-C and Kyung Rudd, MD. It was created on their behalf by Valeta Harms, a trained medical scribe. The creation of this record is based on the scribe's personal observations and the providers' statements to them. This document has been checked and approved by the attending provider.

## 2017-08-28 ENCOUNTER — Ambulatory Visit
Admission: RE | Admit: 2017-08-28 | Discharge: 2017-08-28 | Disposition: A | Discharge status: Home / Self Care | Payer: BLUE CROSS/BLUE SHIELD | Source: Ambulatory Visit | Attending: Radiation Oncology | Admitting: Radiation Oncology

## 2017-08-28 DIAGNOSIS — C383 Malignant neoplasm of mediastinum, part unspecified: Secondary | ICD-10-CM | POA: Diagnosis not present

## 2017-08-28 DIAGNOSIS — Z510 Encounter for antineoplastic radiation therapy: Secondary | ICD-10-CM | POA: Insufficient documentation

## 2017-09-02 ENCOUNTER — Other Ambulatory Visit: Payer: BLUE CROSS/BLUE SHIELD

## 2017-09-02 ENCOUNTER — Ambulatory Visit: Payer: BLUE CROSS/BLUE SHIELD | Admitting: Hematology

## 2017-09-02 DIAGNOSIS — Z510 Encounter for antineoplastic radiation therapy: Secondary | ICD-10-CM | POA: Diagnosis not present

## 2017-09-02 NOTE — Progress Notes (Signed)
HEMATOLOGY/ONCOLOGY CLINIC NOTE  Date of Service: 09/03/2017   Patient Care Team: [name], MD as PCP - General (Family Medicine)  CHIEF COMPLAINTS/PURPOSE OF CONSULTATION:  F/u for continued management of recently diagnosed Non-Small Cell lung Carcinoma  HISTORY OF PRESENTING ILLNESS:   Kayla Wyatt is a wonderful 62 y.o. female who has been referred to [place] by Dr [name] for evaluation and management of Non-Small Cell Carcinoma. She is accompanied today by her husband. The pt reports that she is doing well overall.   Patient recently had a significant hospitalization on 04/02/2017 - as per DC summary by [name] " with abdominal pain, hematemesis, melena and ABLA due to GIB.  She underwent clipping of gastric ulcer and received multiple units PRBC but continued to have drop in H/H with bloody stool. NM GI scan was negative for bleeding and she underwent colonoscopy by Dr. [name] 1/31 showing diverticulosis but no signs of bleeding. Hospital course significant for R- gastrocnemius DVT, PAF, incidental findings of mediastinal mass as well as brief episode of hematuria. She was cleared to start on Xarelto on 02/01 for treatment of DVT. On 2/2 am she developed hematemesis with maroon stools and hypotension requiring fluid bolus as well as 2 units PRBCs. She continued to decline requiring intubation, pressors as well as reversal of anticoagulation with Kcentra on 02/3. She underwent UGI revealing large clot with fresh blood in stomach and underwent mesenteric arteriogram with percutaneous coil embolization fo distal tributary of left gastric artery and placement of IVC filter by interventional radiology.   A fib with RVR felt to be due to hemorrhagic shock and she converted to NSR. [name] felt no further cardiac work up indicated and did not recommend initiating anticoagulation in short term. To follow up with [name] after PET scan and routine biopsy after medically  stable. She continued to have bleeding and underwent visceral angiogram with embolization of inferior division of splenic artery and associated gastric arteries on 2/4. No surgical intervention needed per [name] with recommendations to continue monitoring H/H as well as for recurrence of hematochezia. She tolerated extubation on 2/6 and respiratory status improving.  On 2/11, she has episode of unresponsiveness with jerking of bilateral limbs that lasted about 3 minutes. She had amnesia of events but was back to baseline. EEG revealed "focal slowing over the right temporo-occipital regionandoccasional epileptiform discharges over the right occipital region". Head CT reviewed, unremarkable for acute intracranial process. MRI brain done revealing mild biparietal signal abnormality suspicious for PRES. [name] recommended starting patient on Keppra BID with repeat MRI in 2 weeks. New onset seizure likely provoked by PRES--if resolved--Ok to take patient off Keppra. Patient with resultant generalized weakness. CIR recommended due to functional deficits. "   Later as outpatient she had a PET scan on 05/14/17 which showed a hypermetabolic mediastinal mass which was subsequently biopsied with a bronchoscopy on 05/19/17. She notes that she had smoked for about 35-40 years, half a pack of a day. She reports that she has not had any other symptoms related to her smoking; she has never had to be on home oxygen or inhalers. She has recently begun Symbicort. While being in the hospital she lost about 20 lbs but has maintained her weight since being discharged.  The pt and her husband note that she is concerned about what to do if she is not able to go back to work after her 90-day leave. We recommended that they speak with our  social workers here to explore the available options for insurance coverage Scientist, water quality.  Of note prior to the patient's visit today, pt has had PET/CT completed on  05/14/17 with results revealing 1. The known mediastinal mass is intensely hypermetabolic, worrisome for malignancy. This could reflect lymphoma or small cell lung cancer. Tissue sampling recommended.2. No peripheral lung mass identified. New patchy ground-glass opacity in the lingula is mildly hypermetabolic and likely inflammatory.  On 05/19/17 the pt had a Fine Needle Aspiration which revealed Non-Small cell lung carcinoma.  Most recent lab results (05/19/17) of CBC  is as follows: all values are WNL except for RBC at 2.90, Hgb at 8.6, HCT at 28.0, RDW at 16.4.  On review of systems, pt reports knee pain, and denies abdominal pains, leg swelling, SOB, CP, and any other symptoms.   On PMHx the pt reports acute blood loss anemia, GI hemorrhage with melena, hypercholesteremia, HTN, persistent atrial fibrillation with rapid ventricular response, Vitamin D deficiency, denies liver and kidney problems. On Social Hx the pt reports having smoked for 35-40 years of half a pack per day. She quit smoking on 03/31/17.  On Family Hx the pt reports that her father had heart disease.  Interval History:   Kayla Wyatt returns today regarding her Non-Small Cell Carcinoma. She is here prior to beginning concurrent Carboplatin/taxol and radiation treatment. The patient's last visit with [place] was on 08/20/17. She is accompanied today by her husband. The pt reports that she is doing well overall.   The pt reports that her weight has been stable and she is eating 3 full meals each day. She denies any problems with her port or any pain/discomfort at the site.   Lab results today (09/03/17) of CBC, CMP, and Reticulocytes is as follows: all values are WNL except for RBC at 3.60, HGB at 11.2, HCT at 34.0, RDW at 21.2, Glucose at 104.  On review of systems, pt reports stable weight, stable breathing, and denies problems with port, leg swelling, and any other symptoms.    MEDICAL HISTORY:  Past Medical History:    Diagnosis Date  . Acute blood loss anemia 04/02/2017  . Anxiety   . Gastrointestinal hemorrhage with melena   . Headache   . Hypercholesteremia   . Hypertension   . Persistent atrial fibrillation with rapid ventricular response ([place]) 04/07/2017  . Seizures ([place])    04/2017 while in hospital at [facility]  . Vitamin D deficiency     SURGICAL HISTORY: Past Surgical History:  Procedure Laterality Date  . COLONOSCOPY Left 04/09/2017   Procedure: COLONOSCOPY;  Surgeon: Carol Ada, MD;  Location: [place];  Service: Endoscopy;  Laterality: Left;  . ESOPHAGOGASTRODUODENOSCOPY N/A 04/03/2017   Procedure: ESOPHAGOGASTRODUODENOSCOPY (EGD);  Surgeon: Carol Ada, MD;  Location: [place];  Service: Endoscopy;  Laterality: N/A;  . ESOPHAGOGASTRODUODENOSCOPY (EGD) WITH PROPOFOL N/A 04/12/2017   Procedure: ESOPHAGOGASTRODUODENOSCOPY (EGD) WITH PROPOFOL;  Surgeon: Milus Banister, MD;  Location[facility] ENDOSCOPY;  Service: Endoscopy;  Laterality: N/A;  . IR ANGIOGRAM SELECTIVE EACH ADDITIONAL VESSEL  04/12/2017  . IR ANGIOGRAM SELECTIVE EACH ADDITIONAL VESSEL  04/13/2017  . IR ANGIOGRAM SELECTIVE EACH ADDITIONAL VESSEL  04/13/2017  . IR ANGIOGRAM SELECTIVE EACH ADDITIONAL VESSEL  04/13/2017  . IR ANGIOGRAM VISCERAL SELECTIVE  04/12/2017  . IR ANGIOGRAM VISCERAL SELECTIVE  04/13/2017  . IR EMBO ART  VEN HEMORR LYMPH EXTRAV  INC GUIDE ROADMAPPING  04/12/2017  . IR EMBO ART  VEN HEMORR LYMPH EXTRAV  INC  GUIDE ROADMAPPING  04/13/2017  . IR FLUORO GUIDE CV LINE RIGHT  04/13/2017  . IR FLUORO GUIDE PORT INSERTION RIGHT  07/27/2017  . IR IVC FILTER PLMT / S&I /IMG GUID/MOD SED  04/12/2017  . IR US GUIDE VASC ACCESS RIGHT  04/12/2017  . IR US GUIDE VASC ACCESS RIGHT  04/13/2017  . IR US GUIDE VASC ACCESS RIGHT  04/13/2017  . IR US GUIDE VASC ACCESS RIGHT  07/27/2017  . LEG SURGERY  1974   Blood Clot Removal   . VIDEO BRONCHOSCOPY WITH ENDOBRONCHIAL ULTRASOUND N/A 05/19/2017   Procedure: VIDEO BRONCHOSCOPY WITH ENDOBRONCHIAL  ULTRASOUND;  Surgeon: Grace Isaac, MD;  Location: [place];  Service: Thoracic;  Laterality: N/A;    SOCIAL HISTORY: Social History   Socioeconomic History  . Marital status: Single    Spouse name: Not on file  . Number of children: Not on file  . Years of education: Not on file  . Highest education level: Not on file  Occupational History  . Occupation: Has to lift heavy boxes at times.    Employer: [employer]  Social Needs  . Financial resource strain: Not on file  . Food insecurity:    Worry: Not on file    Inability: Not on file  . Transportation needs:    Medical: Not on file    Non-medical: Not on file  Tobacco Use  . Smoking status: Former Smoker    Packs/day: 0.50    Years: 40.00    Pack years: 20.00    Types: Cigarettes    Last attempt to quit: 03/31/2017    Years since quitting: 0.4  . Smokeless tobacco: Never Used  Substance and Sexual Activity  . Alcohol use: No    Frequency: Never  . Drug use: No  . Sexual activity: Not Currently  Lifestyle  . Physical activity:    Days per week: Not on file    Minutes per session: Not on file  . Stress: Not on file  Relationships  . Social connections:    Talks on phone: Not on file    Gets together: Not on file    Attends religious service: Not on file    Active member of club or organization: Not on file    Attends meetings of clubs or organizations: Not on file    Relationship status: Not on file  . Intimate partner violence:    Fear of current or ex partner: Not on file    Emotionally abused: Not on file    Physically abused: Not on file    Forced sexual activity: Not on file  Other Topics Concern  . Not on file  Social History Narrative  . Not on file    FAMILY HISTORY: Family History  Problem Relation Age of Onset  . Heart disease Father     ALLERGIES:  has No Known Allergies.  MEDICATIONS:  Current Outpatient Medications  Medication Sig Dispense Refill  . acetaminophen (TYLENOL) 325 MG  tablet Take 1-2 tablets (325-650 mg total) by mouth every 4 (four) hours as needed for mild pain.    . budesonide-formoterol (SYMBICORT) 80-4.5 MCG/ACT inhaler Inhale 2 puffs into the lungs 2 (two) times daily. 1 Inhaler 12  . cholecalciferol (VITAMIN D) 1000 units tablet Take 1,000 Units by mouth 2 (two) times daily.    [name] dexamethasone (DECADRON) 4 MG tablet Take 2 tablets (8 mg total) by mouth daily. Start the day after chemotherapy for 2 days. 30 tablet 1  .  levETIRAcetam (KEPPRA) 500 MG tablet TK 1 T PO BID  6  . lidocaine-prilocaine (EMLA) cream Apply to affected area once 30 g 3  . LORazepam (ATIVAN) 0.5 MG tablet Take 2 tablets (1 mg total) by mouth once as needed for up to 1 dose (prior to PET/CT and MRI brain for anxiety). 6 tablet 0  . magnesium chloride (SLOW-MAG) 64 MG TBEC SR tablet Take 2 tablets (128 mg total) by mouth daily. 60 tablet 1  . ondansetron (ZOFRAN) 8 MG tablet Take 1 tablet (8 mg total) by mouth 2 (two) times daily as needed for refractory nausea / vomiting. Start on day 3 after chemo. (Patient not taking: Reported on 08/27/2017) 30 tablet 1  . pantoprazole (PROTONIX) 40 MG tablet TK 1 T PO BID  6  . polycarbophil (FIBERCON) 625 MG tablet Take 2 tablets (1,250 mg total) by mouth 2 (two) times daily. 120 tablet 0  . potassium chloride SA (K-DUR,KLOR-CON) 20 MEQ tablet Take 1 tablet (20 mEq total) by mouth 2 (two) times daily. 60 tablet 0  . prochlorperazine (COMPAZINE) 10 MG tablet Take 1 tablet (10 mg total) by mouth every 6 (six) hours as needed (Nausea or vomiting). (Patient not taking: Reported on 08/27/2017) 30 tablet 1  . saccharomyces boulardii (FLORASTOR) 250 MG capsule Take 1 capsule (250 mg total) by mouth 2 (two) times daily. 100 capsule 0   No current facility-administered medications for this visit.     REVIEW OF SYSTEMS:   A 10+ POINT REVIEW OF SYSTEMS WAS OBTAINED including neurology, dermatology, psychiatry, cardiac, respiratory, lymph, extremities, GI, GU,  Musculoskeletal, constitutional, breasts, reproductive, HEENT.  All pertinent positives are noted in the HPI.  All others are negative.    PHYSICAL EXAMINATION: ECOG PERFORMANCE STATUS: 1 - Symptomatic but completely ambulatory  Vitals:   09/03/17 0846  BP: (!) 150/58  Pulse: 76  Resp: 18  Temp: 97.7 F (36.5 C)  SpO2: 97%   Filed Weights   09/03/17 0846  Weight: 130 lb 12.8 oz (59.3 kg)   .Body mass index is 23.17 kg/m.  GENERAL:alert, in no acute distress and comfortable SKIN: no acute rashes, no significant lesions EYES: conjunctiva are pink and non-injected, sclera anicteric OROPHARYNX: MMM, no exudates, no oropharyngeal erythema or ulceration NECK: supple, no JVD LYMPH:  no palpable lymphadenopathy in the cervical, axillary or inguinal regions LUNGS: clear to auscultation b/l with normal respiratory effort HEART: regular rate & rhythm ABDOMEN:  normoactive bowel sounds , non tender, not distended. Extremity: no pedal edema PSYCH: alert & oriented x 3 with fluent speech NEURO: no focal motor/sensory deficits    LABORATORY DATA:  I have reviewed the data as listed  . CBC Latest Ref Rng & Units 09/03/2017 08/05/2017 07/27/2017  WBC 3.9 - 10.3 K/uL 6.1 6.6 12.4(H)  Hemoglobin 11.6 - 15.9 g/dL 11.2(L) 11.2(L) 11.3(L)  Hematocrit 34.8 - 46.6 % 34.0(L) 34.9 34.9(L)  Platelets 145 - 400 K/uL 239 155 145(L)    . CMP Latest Ref Rng & Units 09/03/2017 08/05/2017 07/14/2017  Glucose 70 - 99 mg/dL 104(H) 95 80  BUN 8 - 23 mg/dL _0 Creatinine 0.44 - 1.00 mg/dL 0.74 0.71 0.73  Sodium 135 - 145 mmol/L 141 143 143  Potassium 3.5 - 5.1 mmol/L 4.4 3.6 3.5  Chloride 98 - 111 mmol/L 105 108 110(H)  CO2 22 - 32 mmol/L _1 Calcium 8.9 - 10.3 mg/dL 10.0 9.7 10.2  Total Protein 6.5 - 8.1 g/dL 7.1  6.9 7.6  Total Bilirubin 0.3 - 1.2 mg/dL 0.5 0.2 0.2  Alkaline Phos 38 - 126 U/L 82 83 82  AST 15 - 41 U/L _0 ALT 0 - 44 U/L _1 05/19/17 Fine Needle  Aspiration:  05/19/17 Bronchial Washing Specimen B:   RADIOGRAPHIC STUDIES: I have personally reviewed the radiological images as listed and agreed with the findings in the report. Mr Jeri Cos Wo Contrast  Result Date: 08/19/2017 CLINICAL DATA:  Non-small cell lung cancer staging. EXAM: MRI HEAD WITHOUT AND WITH CONTRAST TECHNIQUE: Multiplanar, multiecho pulse sequences of the brain and surrounding structures were obtained without and with intravenous contrast. CONTRAST:  64m MULTIHANCE GADOBENATE DIMEGLUMINE 529 MG/ML IV SOLN COMPARISON:  04/21/2017 FINDINGS: Brain: There is no evidence of acute infarct, intracranial hemorrhage, mass, midline shift, or extra-axial fluid collection. The brain is normal in signal, with the small regions of T2/FLAIR hyperintensity in the parietal lobes on the prior MRI having resolved. No abnormal enhancement is identified within limitations of mild motion artifact. Mild cerebral atrophy is unchanged and not necessarily greater than expected for patient's age. Vascular: Major intracranial vascular flow voids are preserved. Skull and upper cervical spine: Unremarkable bone marrow signal. Sinuses/Orbits: Unremarkable orbits. Paranasal sinuses and mastoid air cells are clear. Other: None. IMPRESSION: 1. No evidence of intracranial metastases. 2. Interval resolution of bilateral parietal signal abnormality favoring PRES. Electronically Signed   By: ALogan BoresM.D.   On: 08/19/2017 16:32   Nm Pet Image Restag (ps) Skull Base To Thigh  Result Date: 08/19/2017 CLINICAL DATA:  Subsequent treatment strategy for LUNG CANCER. EXAM: NUCLEAR MEDICINE PET SKULL BASE TO THIGH TECHNIQUE: 6.4 mCi F-18 FDG was injected intravenously. Full-ring PET imaging was performed from the skull base to thigh after the radiotracer. CT data was obtained and used for attenuation correction and anatomic localization. Fasting blood glucose: 94 mg/dl COMPARISON:  05/14/2017 FINDINGS: Mediastinal blood pool  activity: SUV max 94 NECK: Focus of increased uptake within the distal aspect of the tongue has an SUV max of 16.46. On the previous exam the SUV max was equal to 17.3. Incidental CT findings: none CHEST: The mass centered around the AP window is again noted. This measures 3.2 by 4.4 cm and has an SUV max of 14.04. Previously 3.1 x 4.3 cm within SUV max of 11.0. Previously noted left upper lobe ground-glass opacity has resolved in the interval compatible with an inflammatory or infectious process. Incidental CT findings: Moderate changes of centrilobular emphysema. Aortic atherosclerosis noted. Calcifications in the RCA, LAD and left circumflex coronary artery noted. ABDOMEN/PELVIS: No focal liver abnormality. The pancreas and spleen are unremarkable. Normal appearance of the adrenal glands. No hypermetabolic lymph nodes identified. Incidental CT findings: Aortic atherosclerosis. A filter is identified within the IVC. Aortic atherosclerosis without aneurysm. SKELETON: Interval decrease in radiotracer uptake throughout the axial and appendicular skeleton which is favored to represent treatment related changes. No suspicious bone lesions identified. Incidental CT findings: none IMPRESSION: 1. No significant change in size of hypermetabolic AP window mass. The degree of FDG uptake is slightly increased in the interval. 2. Persistent hypermetabolic focus involving the distal aspect of the tongue within SUV max of 16.46. Similar to previous exam. Correlation with direct visualization is advised to exclude head/neck malignancy. 3.  Emphysema (ICD10-J43.9). 4. Aortic atherosclerosis and 3 vessel coronary artery atherosclerotic calcifications. Aortic Atherosclerosis (ICD10-I70.0). Electronically Signed   By: TKerby MoorsM.D.   On: 08/19/2017 15:57  MRI brain 04/21/2017: IMPRESSION: 1. Motion degraded examination. 2. 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 3.  Otherwise negative MRI of the head with and without contrast for age.  Electronically Signed   By: Elon Alas M.D.   On: 04/22/2017 00:34    ASSESSMENT & PLAN:   62 y.o. female with  1. Recently diagnosed Non-Small Cell Carcinoma on bronchoscopy/cytology. Not enough tissue for further characterization or mutation testing. Presenting with mediastinal nodal mass - TxN2 Mx (Atleast Stage III disease) ? Lingular primary vs inflammation. No overt evidence of metastatic disease.  -EGFR mutation studies on blood negative. Patient is s/p 3 cycles of Carboplatin/taxol -resolution left upper lobe ground-glass opacity has resolved in the interval.  2. Bone Pain from neulasta - managed with Tylenol  3. Recent SZ -currently on Keppra. MRI brain - resolution of findings of PRES  4. rt calf DVT - off anticoagulation due to massive GI bleeding. S/p IVC filter.  5. S/p Recent GI bleeding from Gastric ulcer --needing clipping and arterial embolization. hgb normal at 11.2 today. On PPI BID  6. Low Magnesium -- today at 1.6 (07/14/17) -On oral magnesium 271m BID  7. H/o ctx /steroid related thrush --now resolved.  PLAN  Discussed pt labwork from 08/05/17; magnesium was slightly low at 1.6, blood counts and chemistries were otherwise stable -Discussed and reviewed 08/19/17 PET with pt and her husband which revealed  No significant change in size of hypermetabolic AP window mass. The degree of FDG uptake is slightly increased in the interval. 2. Persistent hypermetabolic focus involving the distal aspect of the tongue within SUV max of 16.46. Similar to previous exam. (no clinical co-relate). Lung involvement has decreased.  Plan -Discussed 08/19/17 Brain MRI which revealed No evidence of intracranial metastases. Interval resolution of bilateral parietal signal abnormality favoring PRES -Base of tongue involvement from PET could be from inflammation from noted dental concerns- no other overt  clinical lesions noted. -Discussed that the interval findings from radiographic imaging is mixed and necessitate a decision of how to move forward with treatment: radiation vs chemo and radiation -Pt is clinically improved compared to before treatment; functioning well, not on oxygen, nutritional status improved. -Would suggest treating as Stage IIIB with concurrent chemotherapy and radiation, with maintenance immunotherapy afterwards -will setup for weekly carboplatin/taxol with concurrent RT -she will f/u with rad-onc -Discussed that immunotherapy/Durvalumab may play a role after radiation -Patient's lung capacity revealed moderate diffusion problems in addition to moderate obstruction before treatment  -Will repeat PET 8-12 weeks after treatment completion  Continue weekly carboplatin/taxol with labs as per orders RTC with Dr KIrene Limboin 2 weeks  All of the patients questions were answered with apparent satisfaction. The patient knows to call the clinic with any problems, questions or concerns.  The toal time spent in the appt was 25 minutes and more than 50% was on counseling and direct patient cares and co-ordination with radiation oncology   GSullivan LoneMD Kayla AAHIVMS SAtlantic Rehabilitation InstituteCSaint ALPhonsus Medical Center - [facility] City, IncHematology/Oncology Physician [facility] (Office):       [phone](Work cell):  [phone](Fax):           [phone] 09/03/2017 9:06 AM  I, SBaldwin Jamaica am acting as a sEducation administratorfor Dr KIrene Limbo   .I have reviewed the above documentation for accuracy and completeness, and I agree with the above. .Brunetta GeneraMD

## 2017-09-03 ENCOUNTER — Ambulatory Visit
Admission: RE | Admit: 2017-09-03 | Discharge: 2017-09-03 | Disposition: A | Discharge status: Home / Self Care | Payer: BLUE CROSS/BLUE SHIELD | Source: Ambulatory Visit | Attending: Radiation Oncology | Admitting: Radiation Oncology

## 2017-09-03 ENCOUNTER — Encounter: Payer: Self-pay | Admitting: Hematology

## 2017-09-03 ENCOUNTER — Inpatient Hospital Stay: Payer: BLUE CROSS/BLUE SHIELD | Admitting: Nutrition

## 2017-09-03 ENCOUNTER — Inpatient Hospital Stay: Payer: BLUE CROSS/BLUE SHIELD

## 2017-09-03 ENCOUNTER — Other Ambulatory Visit: Payer: Self-pay | Admitting: Hematology

## 2017-09-03 ENCOUNTER — Inpatient Hospital Stay (HOSPITAL_BASED_OUTPATIENT_CLINIC_OR_DEPARTMENT_OTHER): Payer: BLUE CROSS/BLUE SHIELD | Admitting: Hematology

## 2017-09-03 ENCOUNTER — Ambulatory Visit: Payer: BLUE CROSS/BLUE SHIELD

## 2017-09-03 VITALS — BP 150/58 | HR 76 | Temp 97.7°F | Resp 18 | Ht 63.0 in | Wt 130.8 lb

## 2017-09-03 DIAGNOSIS — M898X9 Other specified disorders of bone, unspecified site: Secondary | ICD-10-CM | POA: Diagnosis not present

## 2017-09-03 DIAGNOSIS — I824Z1 Acute embolism and thrombosis of unspecified deep veins of right distal lower extremity: Secondary | ICD-10-CM

## 2017-09-03 DIAGNOSIS — Z87891 Personal history of nicotine dependence: Secondary | ICD-10-CM

## 2017-09-03 DIAGNOSIS — Z510 Encounter for antineoplastic radiation therapy: Secondary | ICD-10-CM | POA: Diagnosis not present

## 2017-09-03 DIAGNOSIS — C3490 Malignant neoplasm of unspecified part of unspecified bronchus or lung: Secondary | ICD-10-CM | POA: Diagnosis not present

## 2017-09-03 DIAGNOSIS — Z7189 Other specified counseling: Secondary | ICD-10-CM

## 2017-09-03 DIAGNOSIS — C383 Malignant neoplasm of mediastinum, part unspecified: Secondary | ICD-10-CM

## 2017-09-03 DIAGNOSIS — I10 Essential (primary) hypertension: Secondary | ICD-10-CM | POA: Diagnosis not present

## 2017-09-03 DIAGNOSIS — Z5111 Encounter for antineoplastic chemotherapy: Secondary | ICD-10-CM

## 2017-09-03 LAB — CBC WITH DIFFERENTIAL/PLATELET
Basophils Absolute: 0.1 10*3/uL (ref 0.0–0.1)
Basophils Relative: 1 %
Eosinophils Absolute: 0.1 10*3/uL (ref 0.0–0.5)
Eosinophils Relative: 1 %
HCT: 34 % — ABNORMAL LOW (ref 34.8–46.6)
Hemoglobin: 11.2 g/dL — ABNORMAL LOW (ref 11.6–15.9)
Lymphocytes Relative: 30 %
Lymphs Abs: 1.8 10*3/uL (ref 0.9–3.3)
MCH: 31.1 pg (ref 25.1–34.0)
MCHC: 32.9 g/dL (ref 31.5–36.0)
MCV: 94.4 fL (ref 79.5–101.0)
Monocytes Absolute: 0.7 10*3/uL (ref 0.1–0.9)
Monocytes Relative: 12 %
Neutro Abs: 3.5 10*3/uL (ref 1.5–6.5)
Neutrophils Relative %: 56 %
Platelets: 239 10*3/uL (ref 145–400)
RBC: 3.6 MIL/uL — ABNORMAL LOW (ref 3.70–5.45)
RDW: 21.2 % — ABNORMAL HIGH (ref 11.2–14.5)
WBC: 6.1 10*3/uL (ref 3.9–10.3)

## 2017-09-03 LAB — CMP (CANCER CENTER ONLY)
ALT: 12 U/L (ref 0–44)
AST: 16 U/L (ref 15–41)
Albumin: 3.9 g/dL (ref 3.5–5.0)
Alkaline Phosphatase: 82 U/L (ref 38–126)
Anion gap: 9 (ref 5–15)
BUN: 18 mg/dL (ref 8–23)
CO2: 27 mmol/L (ref 22–32)
Calcium: 10 mg/dL (ref 8.9–10.3)
Chloride: 105 mmol/L (ref 98–111)
Creatinine: 0.74 mg/dL (ref 0.44–1.00)
GFR, Est AFR Am: >60 mL/min (ref >60)
GFR, Estimated: >60 mL/min (ref >60)
Glucose, Bld: 104 mg/dL — ABNORMAL HIGH (ref 70–99)
Potassium: 4.4 mmol/L (ref 3.5–5.1)
Sodium: 141 mmol/L (ref 135–145)
Total Bilirubin: 0.5 mg/dL (ref 0.3–1.2)
Total Protein: 7.1 g/dL (ref 6.5–8.1)

## 2017-09-03 LAB — RETICULOCYTES
RBC.: 3.6 MIL/uL — ABNORMAL LOW (ref 3.70–5.45)
Retic Count, Absolute: 46.8 10*3/uL (ref 33.7–90.7)
Retic Ct Pct: 1.3 % (ref 0.7–2.1)

## 2017-09-03 LAB — MAGNESIUM: Magnesium: 1.7 mg/dL (ref 1.7–2.4)

## 2017-09-03 MED ORDER — PACLITAXEL CHEMO INJECTION 300 MG/50ML
45.0000 mg/m2 | Freq: Once | INTRAVENOUS | Status: AC
  Administered 2017-09-03: 72 mg via INTRAVENOUS
  Filled 2017-09-03: qty 12

## 2017-09-03 MED ORDER — SODIUM CHLORIDE 0.9 % IV SOLN
Freq: Once | INTRAVENOUS | Status: AC
  Administered 2017-09-03: 10:00:00 via INTRAVENOUS

## 2017-09-03 MED ORDER — FAMOTIDINE IN NACL 20-0.9 MG/50ML-% IV SOLN
20.0000 mg | Freq: Once | INTRAVENOUS | Status: AC
  Administered 2017-09-03: 20 mg via INTRAVENOUS

## 2017-09-03 MED ORDER — HEPARIN SOD (PORK) LOCK FLUSH 100 UNIT/ML IV SOLN
500.0000 [IU] | Freq: Once | INTRAVENOUS | Status: AC | PRN
  Administered 2017-09-03: 500 [IU]
  Filled 2017-09-03: qty 5

## 2017-09-03 MED ORDER — DEXAMETHASONE SODIUM PHOSPHATE 100 MG/10ML IJ SOLN
20.0000 mg | Freq: Once | INTRAMUSCULAR | Status: AC
  Administered 2017-09-03: 20 mg via INTRAVENOUS
  Filled 2017-09-03: qty 2

## 2017-09-03 MED ORDER — DIPHENHYDRAMINE HCL 50 MG/ML IJ SOLN
50.0000 mg | Freq: Once | INTRAMUSCULAR | Status: AC
  Administered 2017-09-03: 50 mg via INTRAVENOUS

## 2017-09-03 MED ORDER — SONAFINE EX EMUL
1.0000 "application " | Freq: Two times a day (BID) | CUTANEOUS | Status: DC
  Administered 2017-09-03: 1 via TOPICAL

## 2017-09-03 MED ORDER — PALONOSETRON HCL INJECTION 0.25 MG/5ML
0.2500 mg | Freq: Once | INTRAVENOUS | Status: AC
  Administered 2017-09-03: 0.25 mg via INTRAVENOUS

## 2017-09-03 MED ORDER — SODIUM CHLORIDE 0.9 % IV SOLN
180.0000 mg | Freq: Once | INTRAVENOUS | Status: AC
  Administered 2017-09-03: 180 mg via INTRAVENOUS
  Filled 2017-09-03: qty 18

## 2017-09-03 MED ORDER — SODIUM CHLORIDE 0.9% FLUSH
10.0000 mL | INTRAVENOUS | Status: DC | PRN
  Administered 2017-09-03: 10 mL
  Filled 2017-09-03: qty 10

## 2017-09-03 MED ORDER — DIPHENHYDRAMINE HCL 50 MG/ML IJ SOLN
INTRAMUSCULAR | Status: AC
  Filled 2017-09-03: qty 1

## 2017-09-03 MED ORDER — PALONOSETRON HCL INJECTION 0.25 MG/5ML
INTRAVENOUS | Status: AC
  Filled 2017-09-03: qty 5

## 2017-09-03 MED ORDER — FAMOTIDINE IN NACL 20-0.9 MG/50ML-% IV SOLN
INTRAVENOUS | Status: AC
  Filled 2017-09-03: qty 50

## 2017-09-03 NOTE — Progress Notes (Unsigned)
cbc

## 2017-09-03 NOTE — Patient Instructions (Signed)
[facility] Discharge Instructions for Patients Receiving Chemotherapy  Today you received the following chemotherapy agents taxol/carboplatin  To help prevent nausea and vomiting after your treatment, we encourage you to take your nausea medication as directed   If you develop nausea and vomiting that is not controlled by your nausea medication, call the clinic.   BELOW ARE SYMPTOMS THAT SHOULD BE REPORTED IMMEDIATELY:  *FEVER GREATER THAN 100.5 F  *CHILLS WITH OR WITHOUT FEVER  NAUSEA AND VOMITING THAT IS NOT CONTROLLED WITH YOUR NAUSEA MEDICATION  *UNUSUAL SHORTNESS OF BREATH  *UNUSUAL BRUISING OR BLEEDING  TENDERNESS IN MOUTH AND THROAT WITH OR WITHOUT PRESENCE OF ULCERS  *URINARY PROBLEMS  *BOWEL PROBLEMS  UNUSUAL RASH Items with * indicate a potential emergency and should be followed up as soon as possible.  Feel free to call the clinic you have any questions or concerns. The clinic phone number is [phone].  

## 2017-09-03 NOTE — Progress Notes (Signed)
Nutrition follow-up completed with patient diagnosed with non-small cell lung cancer. Weight is stable and documented as 130.8 pounds on June 27. Patient denies nutrition impact symptoms. She is drinking Ensure twice a day. She declines the offer of additional coupons today.  Nutrition diagnosis: Inadequate oral intake resolved.  Encouraged patient to continue strategies for adequate calorie and protein intake.  Recommended she continue with oral nutrition supplements twice a day. Recommended she contact me with questions or concerns in the future.  No follow-up is scheduled at this time.  Patient has contact information for questions or concerns.

## 2017-09-04 ENCOUNTER — Ambulatory Visit
Admission: RE | Admit: 2017-09-04 | Discharge: 2017-09-04 | Disposition: A | Discharge status: Home / Self Care | Payer: BLUE CROSS/BLUE SHIELD | Source: Ambulatory Visit | Attending: Radiation Oncology | Admitting: Radiation Oncology

## 2017-09-04 ENCOUNTER — Telehealth: Payer: Self-pay

## 2017-09-04 DIAGNOSIS — Z510 Encounter for antineoplastic radiation therapy: Secondary | ICD-10-CM | POA: Diagnosis not present

## 2017-09-04 NOTE — Telephone Encounter (Signed)
Unable to leave a message for patient concerning the changes made to her current schedule. Per 6/27 los will mail a letter and calender to the patient.

## 2017-09-05 NOTE — Progress Notes (Signed)
  Radiation Oncology         [phone] ________________________________  Name: Kayla Wyatt MRN: [id number]  Date: 08/28/2017  DOB: 05-01-1955  SIMULATION AND TREATMENT PLANNING NOTE  DIAGNOSIS:     ICD-10-CM   1. Primary malignant neoplasm of mediastinum (HCC) C38.3      Site:  chest  NARRATIVE:  The patient was brought to the [facility].  Identity was confirmed.  All relevant records and images related to the planned course of therapy were reviewed.   Written consent to proceed with treatment was confirmed which was freely given after reviewing the details related to the planned course of therapy had been reviewed with the patient.  Then, the patient was set-up in a stable reproducible  supine position for radiation therapy.  CT images were obtained.  Surface markings were placed.    Medically necessary complex treatment device(s) for immobilization:  Vac-lock bag.   The CT images were loaded into the planning software.  Then the target and avoidance structures were contoured.  Treatment planning then occurred.  The radiation prescription was entered and confirmed.  A total of 5 complex treatment devices were fabricated which relate to the designed radiation treatment fields. Additional reduced fields will be used as necessary to improve the dose homogeneity of the plan. Each of these customized fields/ complex treatment devices will be used on a daily basis during the radiation course. I have requested : 3D Simulation  I have requested a DVH of the following structures: target volume, spinal cord, lungs, heart.   The patient will undergo daily image guidance to ensure accurate localization of the target, and adequate minimize dose to the normal surrounding structures in close proximity to the target.  PLAN:  The patient will receive 60 Gy in 30 fractions initially. The patient will then receive a 6 Gy boost for a final dose of 66 Gy.  Special treatment procedure The  patient will also receive concurrent chemotherapy during the treatment. The patient may therefore experience increased toxicity or side effects and the patient will be monitored for such problems. This may require extra lab work as necessary. This therefore constitutes a special treatment procedure.   ________________________________   Jodelle Gross, MD, PhD

## 2017-09-07 ENCOUNTER — Ambulatory Visit
Admission: RE | Admit: 2017-09-07 | Discharge: 2017-09-07 | Disposition: A | Discharge status: Home / Self Care | Payer: BLUE CROSS/BLUE SHIELD | Source: Ambulatory Visit | Attending: Radiation Oncology | Admitting: Radiation Oncology

## 2017-09-07 DIAGNOSIS — Z510 Encounter for antineoplastic radiation therapy: Secondary | ICD-10-CM | POA: Diagnosis not present

## 2017-09-07 DIAGNOSIS — C383 Malignant neoplasm of mediastinum, part unspecified: Secondary | ICD-10-CM | POA: Insufficient documentation

## 2017-09-08 ENCOUNTER — Ambulatory Visit
Admission: RE | Admit: 2017-09-08 | Discharge: 2017-09-08 | Disposition: A | Discharge status: Home / Self Care | Payer: BLUE CROSS/BLUE SHIELD | Source: Ambulatory Visit | Attending: Radiation Oncology | Admitting: Radiation Oncology

## 2017-09-08 DIAGNOSIS — C383 Malignant neoplasm of mediastinum, part unspecified: Secondary | ICD-10-CM | POA: Diagnosis not present

## 2017-09-09 ENCOUNTER — Encounter: Payer: BLUE CROSS/BLUE SHIELD | Attending: Registered Nurse | Admitting: Physical Medicine & Rehabilitation

## 2017-09-09 ENCOUNTER — Ambulatory Visit
Admission: RE | Admit: 2017-09-09 | Discharge: 2017-09-09 | Disposition: A | Discharge status: Home / Self Care | Payer: BLUE CROSS/BLUE SHIELD | Source: Ambulatory Visit | Attending: Radiation Oncology | Admitting: Radiation Oncology

## 2017-09-09 ENCOUNTER — Encounter: Payer: Self-pay | Admitting: Physical Medicine & Rehabilitation

## 2017-09-09 ENCOUNTER — Other Ambulatory Visit: Payer: Self-pay

## 2017-09-09 VITALS — BP 135/66 | HR 77 | Resp 14 | Ht 63.0 in | Wt 130.0 lb

## 2017-09-09 DIAGNOSIS — F17200 Nicotine dependence, unspecified, uncomplicated: Secondary | ICD-10-CM | POA: Diagnosis not present

## 2017-09-09 DIAGNOSIS — G7281 Critical illness myopathy: Secondary | ICD-10-CM | POA: Diagnosis not present

## 2017-09-09 DIAGNOSIS — E559 Vitamin D deficiency, unspecified: Secondary | ICD-10-CM | POA: Diagnosis not present

## 2017-09-09 DIAGNOSIS — C3490 Malignant neoplasm of unspecified part of unspecified bronchus or lung: Secondary | ICD-10-CM

## 2017-09-09 DIAGNOSIS — K259 Gastric ulcer, unspecified as acute or chronic, without hemorrhage or perforation: Secondary | ICD-10-CM | POA: Diagnosis present

## 2017-09-09 DIAGNOSIS — I10 Essential (primary) hypertension: Secondary | ICD-10-CM | POA: Diagnosis not present

## 2017-09-09 DIAGNOSIS — R569 Unspecified convulsions: Secondary | ICD-10-CM | POA: Insufficient documentation

## 2017-09-09 DIAGNOSIS — Z7689 Persons encountering health services in other specified circumstances: Secondary | ICD-10-CM | POA: Diagnosis not present

## 2017-09-09 DIAGNOSIS — E7800 Pure hypercholesterolemia, unspecified: Secondary | ICD-10-CM | POA: Diagnosis not present

## 2017-09-09 DIAGNOSIS — Z9889 Other specified postprocedural states: Secondary | ICD-10-CM | POA: Insufficient documentation

## 2017-09-09 DIAGNOSIS — I481 Persistent atrial fibrillation: Secondary | ICD-10-CM | POA: Insufficient documentation

## 2017-09-09 DIAGNOSIS — C383 Malignant neoplasm of mediastinum, part unspecified: Secondary | ICD-10-CM

## 2017-09-09 DIAGNOSIS — K921 Melena: Secondary | ICD-10-CM

## 2017-09-09 NOTE — Progress Notes (Signed)
Subjective:    Patient ID: Kayla Wyatt, female    DOB: 15-Jan-1956, 62 y.o.   MRN: [id number]  HPI 62 y.o. female with history of HTN presents for hospital follow up for PRES.     Last clinic visit 07/2317. Since that time, she went to see Heme/Onc, notes reviewed, plan to start Chemo/Radiation. She continues to do HEP.  She states she has not had time to see GI. She had MRI of brain, which was unremarkable. Denies falls.  Pain Inventory Average Pain 0 Pain Right Now 0 My pain is no pain  In the last 24 hours, has pain interfered with the following? General activity 0 Relation with others 0 Enjoyment of life 0 What TIME of day is your pain at its worst? no pain Sleep (in general) Good  Pain is worse with: no pain Pain improves with: no pain Relief from Meds: no pain  Mobility walk without assistance ability to climb steps?  yes do you drive?  no  Function employed # of hrs/week 0  Neuro/Psych No problems in this area  Prior Studies Any changes since last visit?  no  Physicians involved in your care Any changes since last visit?  no   Family History  Problem Relation Age of Onset  . Heart disease Father    Social History   Socioeconomic History  . Marital status: Single    Spouse name: Not on file  . Number of children: Not on file  . Years of education: Not on file  . Highest education level: Not on file  Occupational History  . Occupation: Has to lift heavy boxes at times.    Employer: [employer]  Social Needs  . Financial resource strain: Not on file  . Food insecurity:    Worry: Not on file    Inability: Not on file  . Transportation needs:    Medical: Not on file    Non-medical: Not on file  Tobacco Use  . Smoking status: Former Smoker    Packs/day: 0.50    Years: 40.00    Pack years: 20.00    Types: Cigarettes    Last attempt to quit: 03/31/2017    Years since quitting: 0.4  . Smokeless tobacco: Never Used  Substance and Sexual Activity    . Alcohol use: No    Frequency: Never  . Drug use: No  . Sexual activity: Not Currently  Lifestyle  . Physical activity:    Days per week: Not on file    Minutes per session: Not on file  . Stress: Not on file  Relationships  . Social connections:    Talks on phone: Not on file    Gets together: Not on file    Attends religious service: Not on file    Active member of club or organization: Not on file    Attends meetings of clubs or organizations: Not on file    Relationship status: Not on file  Other Topics Concern  . Not on file  Social History Narrative  . Not on file   Past Surgical History:  Procedure Laterality Date  . COLONOSCOPY Left 04/09/2017   Procedure: COLONOSCOPY;  Surgeon: Carol Ada, MD;  Location: [place];  Service: Endoscopy;  Laterality: Left;  . ESOPHAGOGASTRODUODENOSCOPY N/A 04/03/2017   Procedure: ESOPHAGOGASTRODUODENOSCOPY (EGD);  Surgeon: Carol Ada, MD;  Location: [place];  Service: Endoscopy;  Laterality: N/A;  . ESOPHAGOGASTRODUODENOSCOPY (EGD) WITH PROPOFOL N/A 04/12/2017   Procedure: ESOPHAGOGASTRODUODENOSCOPY (EGD) WITH PROPOFOL;  Surgeon: Milus Banister, MD;  Location: [facility] ENDOSCOPY;  Service: Endoscopy;  Laterality: N/A;  . IR ANGIOGRAM SELECTIVE EACH ADDITIONAL VESSEL  04/12/2017  . IR ANGIOGRAM SELECTIVE EACH ADDITIONAL VESSEL  04/13/2017  . IR ANGIOGRAM SELECTIVE EACH ADDITIONAL VESSEL  04/13/2017  . IR ANGIOGRAM SELECTIVE EACH ADDITIONAL VESSEL  04/13/2017  . IR ANGIOGRAM VISCERAL SELECTIVE  04/12/2017  . IR ANGIOGRAM VISCERAL SELECTIVE  04/13/2017  . IR EMBO ART  VEN HEMORR LYMPH EXTRAV  INC GUIDE ROADMAPPING  04/12/2017  . IR EMBO ART  VEN HEMORR LYMPH EXTRAV  INC GUIDE ROADMAPPING  04/13/2017  . IR FLUORO GUIDE CV LINE RIGHT  04/13/2017  . IR FLUORO GUIDE PORT INSERTION RIGHT  07/27/2017  . IR IVC FILTER PLMT / S&I /IMG GUID/MOD SED  04/12/2017  . IR US GUIDE VASC ACCESS RIGHT  04/12/2017  . IR US GUIDE VASC ACCESS RIGHT  04/13/2017  . IR US  GUIDE VASC ACCESS RIGHT  04/13/2017  . IR US GUIDE VASC ACCESS RIGHT  07/27/2017  . LEG SURGERY  1974   Blood Clot Removal   . VIDEO BRONCHOSCOPY WITH ENDOBRONCHIAL ULTRASOUND N/A 05/19/2017   Procedure: VIDEO BRONCHOSCOPY WITH ENDOBRONCHIAL ULTRASOUND;  Surgeon: Grace Isaac, MD;  Location: [place];  Service: Thoracic;  Laterality: N/A;   Past Medical History:  Diagnosis Date  . Acute blood loss anemia 04/02/2017  . Anxiety   . Gastrointestinal hemorrhage with melena   . Headache   . Hypercholesteremia   . Hypertension   . Persistent atrial fibrillation with rapid ventricular response ([place]) 04/07/2017  . Seizures ([place])    04/2017 while in hospital at [facility]  . Vitamin D deficiency    BP 135/66 (BP Location: Right Arm, Patient Position: Sitting, Cuff Size: Normal)   Pulse 77   Resp 14   Ht 5\' 3"  (1.6 m)   Wt 130 lb (59 kg)   SpO2 95%   BMI 23.03 kg/m   Opioid Risk Score:   Fall Risk Score:  `1  Depression screen PHQ 2/9  Depression screen [facility] 2/9 08/27/2017 06/09/2017 05/11/2017 05/11/2017  Decreased Interest 0 0 0 0  Down, Depressed, Hopeless 0 0 0 0  PHQ - 2 Score 0 0 0 0  Altered sleeping - - 0 -  Tired, decreased energy - - 1 -  Change in appetite - - 0 -  Feeling bad or failure about yourself  - - 0 -  Trouble concentrating - - 0 -  Moving slowly or fidgety/restless - - 0 -  Suicidal thoughts - - 0 -  PHQ-9 Score - - 1 -  Difficult doing work/chores - - Not difficult at all -     Review of Systems  Constitutional: Negative.   HENT: Negative.   Eyes: Negative.   Respiratory: Negative.   Cardiovascular: Negative.   Gastrointestinal: Negative.   Endocrine: Negative.   Genitourinary: Negative.   Musculoskeletal: Negative.   Skin: Negative.   Allergic/Immunologic: Negative.   Neurological: Negative.   Hematological: Negative.   Psychiatric/Behavioral: Negative.   All other systems reviewed and are negative.     Objective:   Physical Exam Constitutional: She  appears well-developed. No distress.  HENT: Normocephalic and atraumatic.  Eyes: EOM are normal. No discharge.  Cardiovascular: Irregularly irregular. No JVD. Respiratory: Clear. Unlabored. GI: BS +, non-tender  Musculoskeletal: She exhibits no edema or tenderness.  Neurological: She is alert and oriented.  HOH Motor: 4+-5/5 bilateral upper extremities,  4+/5 bilateral hip flexion,  4+-5/5 knee extension, ankle dorsi/plantar flexion  Skin: Warm and dry. Intact.  Psychiatric: Her speech is normal and behavior is normal.     Assessment & Plan:  62 y.o. female with history of HTN presents for hospital follow up for PRES.    1. Gastric ulcer/GIB with hemorrhagic shock/ABLA:    Follow up with GI, needs appointment, reminded again (x2), states she has not had time due to other appointments  2. Non-small cell CA, stage III  Cont follow up with Heme/Onc, currently undergoing Chemo/radiation  3. PRES with new onset seizures:   Continue Keppra bid  She had brain MRI, relatively unremarkable  Encouraged follow up with Neurology to discuss seizure meds

## 2017-09-11 ENCOUNTER — Ambulatory Visit
Admission: RE | Admit: 2017-09-11 | Discharge: 2017-09-11 | Disposition: A | Discharge status: Home / Self Care | Payer: BLUE CROSS/BLUE SHIELD | Source: Ambulatory Visit | Attending: Radiation Oncology | Admitting: Radiation Oncology

## 2017-09-11 ENCOUNTER — Inpatient Hospital Stay: Payer: BLUE CROSS/BLUE SHIELD | Attending: Hematology

## 2017-09-11 ENCOUNTER — Inpatient Hospital Stay: Payer: BLUE CROSS/BLUE SHIELD

## 2017-09-11 VITALS — BP 129/52 | HR 58 | Temp 97.6°F | Resp 17

## 2017-09-11 DIAGNOSIS — C383 Malignant neoplasm of mediastinum, part unspecified: Secondary | ICD-10-CM

## 2017-09-11 DIAGNOSIS — D500 Iron deficiency anemia secondary to blood loss (chronic): Secondary | ICD-10-CM | POA: Diagnosis not present

## 2017-09-11 DIAGNOSIS — Z86718 Personal history of other venous thrombosis and embolism: Secondary | ICD-10-CM | POA: Insufficient documentation

## 2017-09-11 DIAGNOSIS — Z87891 Personal history of nicotine dependence: Secondary | ICD-10-CM | POA: Diagnosis not present

## 2017-09-11 DIAGNOSIS — K254 Chronic or unspecified gastric ulcer with hemorrhage: Secondary | ICD-10-CM | POA: Insufficient documentation

## 2017-09-11 DIAGNOSIS — C3490 Malignant neoplasm of unspecified part of unspecified bronchus or lung: Secondary | ICD-10-CM | POA: Insufficient documentation

## 2017-09-11 DIAGNOSIS — Z5111 Encounter for antineoplastic chemotherapy: Secondary | ICD-10-CM | POA: Diagnosis present

## 2017-09-11 DIAGNOSIS — R930 Abnormal findings on diagnostic imaging of skull and head, not elsewhere classified: Secondary | ICD-10-CM | POA: Insufficient documentation

## 2017-09-11 DIAGNOSIS — Z7189 Other specified counseling: Secondary | ICD-10-CM

## 2017-09-11 LAB — COMPREHENSIVE METABOLIC PANEL
ALT: 15 U/L (ref 0–44)
AST: 19 U/L (ref 15–41)
Albumin: 3.9 g/dL (ref 3.5–5.0)
Alkaline Phosphatase: 57 U/L (ref 38–126)
Anion gap: 10 (ref 5–15)
BUN: 23 mg/dL (ref 8–23)
CO2: 25 mmol/L (ref 22–32)
Calcium: 9.7 mg/dL (ref 8.9–10.3)
Chloride: 107 mmol/L (ref 98–111)
Creatinine, Ser: 0.7 mg/dL (ref 0.44–1.00)
GFR calc Af Amer: >60 mL/min (ref >60)
GFR calc non Af Amer: >60 mL/min (ref >60)
Glucose, Bld: 110 mg/dL — ABNORMAL HIGH (ref 70–99)
Potassium: 4.1 mmol/L (ref 3.5–5.1)
Sodium: 142 mmol/L (ref 135–145)
Total Bilirubin: 0.8 mg/dL (ref 0.3–1.2)
Total Protein: 6.6 g/dL (ref 6.5–8.1)

## 2017-09-11 LAB — CBC WITH DIFFERENTIAL (CANCER CENTER ONLY)
Basophils Absolute: 0 10*3/uL (ref 0.0–0.1)
Basophils Relative: 0 %
Eosinophils Absolute: 0 10*3/uL (ref 0.0–0.5)
Eosinophils Relative: 1 %
HCT: 32.7 % — ABNORMAL LOW (ref 34.8–46.6)
Hemoglobin: 11.1 g/dL — ABNORMAL LOW (ref 11.6–15.9)
Lymphocytes Relative: 24 %
Lymphs Abs: 1.3 10*3/uL (ref 0.9–3.3)
MCH: 32.6 pg (ref 25.1–34.0)
MCHC: 33.9 g/dL (ref 31.5–36.0)
MCV: 96.3 fL (ref 79.5–101.0)
Monocytes Absolute: 0.4 10*3/uL (ref 0.1–0.9)
Monocytes Relative: 7 %
Neutro Abs: 3.7 10*3/uL (ref 1.5–6.5)
Neutrophils Relative %: 68 %
Platelet Count: 191 10*3/uL (ref 145–400)
RBC: 3.4 MIL/uL — ABNORMAL LOW (ref 3.70–5.45)
RDW: 22.4 % — ABNORMAL HIGH (ref 11.2–14.5)
WBC Count: 5.5 10*3/uL (ref 3.9–10.3)

## 2017-09-11 LAB — PHOSPHORUS: Phosphorus: 3.6 mg/dL (ref 2.5–4.6)

## 2017-09-11 LAB — MAGNESIUM: Magnesium: 1.5 mg/dL (ref 1.7–2.4)

## 2017-09-11 MED ORDER — SODIUM CHLORIDE 0.9 % IV SOLN
20.0000 mg | Freq: Once | INTRAVENOUS | Status: AC
  Administered 2017-09-11: 20 mg via INTRAVENOUS
  Filled 2017-09-11: qty 2

## 2017-09-11 MED ORDER — DIPHENHYDRAMINE HCL 50 MG/ML IJ SOLN
50.0000 mg | Freq: Once | INTRAMUSCULAR | Status: AC
  Administered 2017-09-11: 50 mg via INTRAVENOUS

## 2017-09-11 MED ORDER — PALONOSETRON HCL INJECTION 0.25 MG/5ML
0.2500 mg | Freq: Once | INTRAVENOUS | Status: AC
  Administered 2017-09-11: 0.25 mg via INTRAVENOUS

## 2017-09-11 MED ORDER — FAMOTIDINE IN NACL 20-0.9 MG/50ML-% IV SOLN
INTRAVENOUS | Status: AC
  Filled 2017-09-11: qty 50

## 2017-09-11 MED ORDER — HEPARIN SOD (PORK) LOCK FLUSH 100 UNIT/ML IV SOLN
500.0000 [IU] | Freq: Once | INTRAVENOUS | Status: AC | PRN
  Administered 2017-09-11: 500 [IU]
  Filled 2017-09-11: qty 5

## 2017-09-11 MED ORDER — FAMOTIDINE IN NACL 20-0.9 MG/50ML-% IV SOLN
20.0000 mg | Freq: Once | INTRAVENOUS | Status: AC
  Administered 2017-09-11: 20 mg via INTRAVENOUS

## 2017-09-11 MED ORDER — SODIUM CHLORIDE 0.9 % IV SOLN
Freq: Once | INTRAVENOUS | Status: AC
  Administered 2017-09-11: 10:00:00 via INTRAVENOUS

## 2017-09-11 MED ORDER — DIPHENHYDRAMINE HCL 50 MG/ML IJ SOLN
INTRAMUSCULAR | Status: AC
  Filled 2017-09-11: qty 1

## 2017-09-11 MED ORDER — PACLITAXEL CHEMO INJECTION 300 MG/50ML
45.0000 mg/m2 | Freq: Once | INTRAVENOUS | Status: AC
  Administered 2017-09-11: 72 mg via INTRAVENOUS
  Filled 2017-09-11: qty 12

## 2017-09-11 MED ORDER — SODIUM CHLORIDE 0.9% FLUSH
10.0000 mL | INTRAVENOUS | Status: DC | PRN
  Administered 2017-09-11: 10 mL
  Filled 2017-09-11: qty 10

## 2017-09-11 MED ORDER — SODIUM CHLORIDE 0.9 % IV SOLN
180.0000 mg | Freq: Once | INTRAVENOUS | Status: AC
  Administered 2017-09-11: 180 mg via INTRAVENOUS
  Filled 2017-09-11: qty 18

## 2017-09-11 MED ORDER — PALONOSETRON HCL INJECTION 0.25 MG/5ML
INTRAVENOUS | Status: AC
  Filled 2017-09-11: qty 5

## 2017-09-11 NOTE — Patient Instructions (Signed)
[facility] Discharge Instructions for Patients Receiving Chemotherapy  Today you received the following chemotherapy agents Taxol, carboplatin To help prevent nausea and vomiting after your treatment, we encourage you to take your nausea medication as directed If you develop nausea and vomiting that is not controlled by your nausea medication, call the clinic.   BELOW ARE SYMPTOMS THAT SHOULD BE REPORTED IMMEDIATELY:  *FEVER GREATER THAN 100.5 F  *CHILLS WITH OR WITHOUT FEVER  NAUSEA AND VOMITING THAT IS NOT CONTROLLED WITH YOUR NAUSEA MEDICATION  *UNUSUAL SHORTNESS OF BREATH  *UNUSUAL BRUISING OR BLEEDING  TENDERNESS IN MOUTH AND THROAT WITH OR WITHOUT PRESENCE OF ULCERS  *URINARY PROBLEMS  *BOWEL PROBLEMS  UNUSUAL RASH Items with * indicate a potential emergency and should be followed up as soon as possible.  Feel free to call the clinic should you have any questions or concerns. The clinic phone number is [phone].  Please show the [facility] at check-in to the Emergency Department and triage nurse.

## 2017-09-14 ENCOUNTER — Ambulatory Visit
Admission: RE | Admit: 2017-09-14 | Discharge: 2017-09-14 | Disposition: A | Discharge status: Home / Self Care | Payer: BLUE CROSS/BLUE SHIELD | Source: Ambulatory Visit | Attending: Radiation Oncology | Admitting: Radiation Oncology

## 2017-09-14 DIAGNOSIS — C383 Malignant neoplasm of mediastinum, part unspecified: Secondary | ICD-10-CM | POA: Diagnosis not present

## 2017-09-15 ENCOUNTER — Ambulatory Visit
Admission: RE | Admit: 2017-09-15 | Discharge: 2017-09-15 | Disposition: A | Discharge status: Home / Self Care | Payer: BLUE CROSS/BLUE SHIELD | Source: Ambulatory Visit | Attending: Radiation Oncology | Admitting: Radiation Oncology

## 2017-09-15 DIAGNOSIS — C383 Malignant neoplasm of mediastinum, part unspecified: Secondary | ICD-10-CM | POA: Diagnosis not present

## 2017-09-16 ENCOUNTER — Ambulatory Visit
Admission: RE | Admit: 2017-09-16 | Discharge: 2017-09-16 | Disposition: A | Discharge status: Home / Self Care | Payer: BLUE CROSS/BLUE SHIELD | Source: Ambulatory Visit | Attending: Radiation Oncology | Admitting: Radiation Oncology

## 2017-09-16 ENCOUNTER — Other Ambulatory Visit: Payer: Self-pay

## 2017-09-16 DIAGNOSIS — C383 Malignant neoplasm of mediastinum, part unspecified: Secondary | ICD-10-CM

## 2017-09-16 NOTE — Progress Notes (Signed)
HEMATOLOGY/ONCOLOGY CLINIC NOTE  Date of Service: 09/17/2017   Patient Care Team: Kayla Frees, MD as PCP - General (Family Medicine)  CHIEF COMPLAINTS/PURPOSE OF CONSULTATION:  F/u for continued management of recently diagnosed Non-Small Cell lung Carcinoma  HISTORY OF PRESENTING ILLNESS:   Kayla Wyatt is a wonderful 62 y.o. female who has been referred to [place] by Kayla Wyatt for evaluation and management of Non-Small Cell Carcinoma. She is accompanied today by her husband. The pt reports that she is doing well overall.   Patient recently had a significant hospitalization on 04/02/2017 - as per DC summary by Kayla Wyatt " with abdominal pain, hematemesis, melena and ABLA due to GIB.  She underwent clipping of gastric ulcer and received multiple units PRBC but continued to have drop in H/H with bloody stool. NM GI scan was negative for bleeding and she underwent colonoscopy by Kayla. Benson Wyatt 1/31 showing diverticulosis but no signs of bleeding. Hospital course significant for R- gastrocnemius DVT, PAF, incidental findings of mediastinal mass as well as brief episode of hematuria. She was cleared to start on Xarelto on 02/01 for treatment of DVT. On 2/2 am she developed hematemesis with maroon stools and hypotension requiring fluid bolus as well as 2 units PRBCs. She continued to decline requiring intubation, pressors as well as reversal of anticoagulation with Kcentra on 02/3. She underwent UGI revealing large clot with fresh blood in stomach and underwent mesenteric arteriogram with percutaneous coil embolization fo distal tributary of left gastric artery and placement of IVC filter by interventional radiology.   A fib with RVR felt to be due to hemorrhagic shock and she converted to NSR. Kayla. Debara Wyatt felt no further cardiac work up indicated and did not recommend initiating anticoagulation in short term. To follow up with Kayla. Servando Wyatt after PET scan and routine biopsy after medically  stable. She continued to have bleeding and underwent visceral angiogram with embolization of inferior division of splenic artery and associated gastric arteries on 2/4. No surgical intervention needed per Kayla. Donne Wyatt with recommendations to continue monitoring H/H as well as for recurrence of hematochezia. She tolerated extubation on 2/6 and respiratory status improving.  On 2/11, she has episode of unresponsiveness with jerking of bilateral limbs that lasted about 3 minutes. She had amnesia of events but was back to baseline. EEG revealed "focal slowing over the right temporo-occipital regionandoccasional epileptiform discharges over the right occipital region". Head CT reviewed, unremarkable for acute intracranial process. MRI brain done revealing mild biparietal signal abnormality suspicious for PRES. Kayla. Cheral Wyatt recommended starting patient on Keppra BID with repeat MRI in 2 weeks. New onset seizure likely provoked by PRES--if resolved--Ok to take patient off Keppra. Patient with resultant generalized weakness. CIR recommended due to functional deficits. "   Later as outpatient she had a PET scan on 05/14/17 which showed a hypermetabolic mediastinal mass which was subsequently biopsied with a bronchoscopy on 05/19/17. She notes that she had smoked for about 35-40 years, half a pack of a day. She reports that she has not had any other symptoms related to her smoking; she has never had to be on home oxygen or inhalers. She has recently begun Symbicort. While being in the hospital she lost about 20 lbs but has maintained her weight since being discharged.  The pt and her husband note that she is concerned about what to do if she is not able to go back to work after her 90-day leave. We recommended that they speak with our  social workers here to explore the available options for insurance coverage Scientist, water quality.  Of note prior to the patient's visit today, pt has had PET/CT completed on  05/14/17 with results revealing 1. The known mediastinal mass is intensely hypermetabolic, worrisome for malignancy. This could reflect lymphoma or small cell lung cancer. Tissue sampling recommended.2. No peripheral lung mass identified. New patchy ground-glass opacity in the lingula is mildly hypermetabolic and likely inflammatory.  On 05/19/17 the pt had a Fine Needle Aspiration which revealed Non-Small cell lung carcinoma.  Most recent lab results (05/19/17) of CBC  is as follows: all values are WNL except for RBC at 2.90, Hgb at 8.6, HCT at 28.0, RDW at 16.4.  On review of systems, pt reports knee pain, and denies abdominal pains, leg swelling, SOB, CP, and any other symptoms.   On PMHx the pt reports acute blood loss anemia, GI hemorrhage with melena, hypercholesteremia, HTN, persistent atrial fibrillation with rapid ventricular response, Vitamin D deficiency, denies liver and kidney problems. On Social Hx the pt reports having smoked for 35-40 years of half a pack per day. She quit smoking on 03/31/17.  On Family Hx the pt reports that her father had heart disease.  Interval History:   [name] returns today regarding her Non-Small Cell Carcinoma. She is here for concurrent Carboplatin/taxol and radiation treatment. The patient's last visit with [place] was on 09/03/17. She is accompanied today by her husband. The pt reports that she is doing well overall.   The pt reports that she has no new concerns at this time and is tolerating concurrent chemotherapy and radiation well. She notes good energy levels, eating well, moving her bowels well and denies any difficulty breathing, nausea, or other constitutional symptoms.   Lab results today (09/17/17) of CBC w/diff is as follows: all values are WNL except for RBC at 3.01, HGB at 9.7, HCT at 29.7, RDW at 19.5, PLT at 133k. CMP 09/17/17 is pending Magnesium 09/17/17 is 1.5 Phosphorous 09/17/17 is 3.4  On review of systems, pt reports eating well,  moving her bowels well, good energy levels, and denies fevers, chills, night sweats, swallowing problems, breathing problems, SOB, skin issues, problems with her port, nausea, mouth sores, abdominal pains, and any other symptoms.   MEDICAL HISTORY:  Past Medical History:  Diagnosis Date  . Acute blood loss anemia 04/02/2017  . Anxiety   . Gastrointestinal hemorrhage with melena   . Headache   . Hypercholesteremia   . Hypertension   . Persistent atrial fibrillation with rapid ventricular response ([place]) 04/07/2017  . Seizures ([place])    04/2017 while in hospital at [facility]  . Vitamin D deficiency     SURGICAL HISTORY: Past Surgical History:  Procedure Laterality Date  . COLONOSCOPY Left 04/09/2017   Procedure: COLONOSCOPY;  Surgeon: Carol Ada, MD;  Location: [place];  Service: Endoscopy;  Laterality: Left;  . ESOPHAGOGASTRODUODENOSCOPY N/A 04/03/2017   Procedure: ESOPHAGOGASTRODUODENOSCOPY (EGD);  Surgeon: Carol Ada, MD;  Location: [place];  Service: Endoscopy;  Laterality: N/A;  . ESOPHAGOGASTRODUODENOSCOPY (EGD) WITH PROPOFOL N/A 04/12/2017   Procedure: ESOPHAGOGASTRODUODENOSCOPY (EGD) WITH PROPOFOL;  Surgeon: Milus Banister, MD;  Location: [facility] ENDOSCOPY;  Service: Endoscopy;  Laterality: N/A;  . IR ANGIOGRAM SELECTIVE EACH ADDITIONAL VESSEL  04/12/2017  . IR ANGIOGRAM SELECTIVE EACH ADDITIONAL VESSEL  04/13/2017  . IR ANGIOGRAM SELECTIVE EACH ADDITIONAL VESSEL  04/13/2017  . IR ANGIOGRAM SELECTIVE EACH ADDITIONAL VESSEL  04/13/2017  . IR ANGIOGRAM VISCERAL SELECTIVE  04/12/2017  .  IR ANGIOGRAM VISCERAL SELECTIVE  04/13/2017  . IR EMBO ART  VEN HEMORR LYMPH EXTRAV  INC GUIDE ROADMAPPING  04/12/2017  . IR EMBO ART  VEN HEMORR LYMPH EXTRAV  INC GUIDE ROADMAPPING  04/13/2017  . IR FLUORO GUIDE CV LINE RIGHT  04/13/2017  . IR FLUORO GUIDE PORT INSERTION RIGHT  07/27/2017  . IR IVC FILTER PLMT / S&I /IMG GUID/MOD SED  04/12/2017  . IR US GUIDE VASC ACCESS RIGHT  04/12/2017  . IR US GUIDE VASC ACCESS  RIGHT  04/13/2017  . IR US GUIDE VASC ACCESS RIGHT  04/13/2017  . IR US GUIDE VASC ACCESS RIGHT  07/27/2017  . LEG SURGERY  1974   Blood Clot Removal   . VIDEO BRONCHOSCOPY WITH ENDOBRONCHIAL ULTRASOUND N/A 05/19/2017   Procedure: VIDEO BRONCHOSCOPY WITH ENDOBRONCHIAL ULTRASOUND;  Surgeon: Grace Isaac, MD;  Location: [place];  Service: Thoracic;  Laterality: N/A;    SOCIAL HISTORY: Social History   Socioeconomic History  . Marital status: Single    Spouse name: Not on file  . Number of children: Not on file  . Years of education: Not on file  . Highest education level: Not on file  Occupational History  . Occupation: Has to lift heavy boxes at times.    Employer: [employer]  Social Needs  . Financial resource strain: Not on file  . Food insecurity:    Worry: Not on file    Inability: Not on file  . Transportation needs:    Medical: Not on file    Non-medical: Not on file  Tobacco Use  . Smoking status: Former Smoker    Packs/day: 0.50    Years: 40.00    Pack years: 20.00    Types: Cigarettes    Last attempt to quit: 03/31/2017    Years since quitting: 0.4  . Smokeless tobacco: Never Used  Substance and Sexual Activity  . Alcohol use: No    Frequency: Never  . Drug use: No  . Sexual activity: Not Currently  Lifestyle  . Physical activity:    Days per week: Not on file    Minutes per session: Not on file  . Stress: Not on file  Relationships  . Social connections:    Talks on phone: Not on file    Gets together: Not on file    Attends religious service: Not on file    Active member of club or organization: Not on file    Attends meetings of clubs or organizations: Not on file    Relationship status: Not on file  . Intimate partner violence:    Fear of current or ex partner: Not on file    Emotionally abused: Not on file    Physically abused: Not on file    Forced sexual activity: Not on file  Other Topics Concern  . Not on file  Social History Narrative  .  Not on file    FAMILY HISTORY: Family History  Problem Relation Age of Onset  . Heart disease Father     ALLERGIES:  has No Known Allergies.  MEDICATIONS:  Current Outpatient Medications  Medication Sig Dispense Refill  . acetaminophen (TYLENOL) 325 MG tablet Take 1-2 tablets (325-650 mg total) by mouth every 4 (four) hours as needed for mild pain.    . budesonide-formoterol (SYMBICORT) 80-4.5 MCG/ACT inhaler Inhale 2 puffs into the lungs 2 (two) times daily. 1 Inhaler 12  . cholecalciferol (VITAMIN D) 1000 units tablet Take 1,000 Units by  mouth 2 (two) times daily.    [name] dexamethasone (DECADRON) 4 MG tablet Take 2 tablets (8 mg total) by mouth daily. Start the day after chemotherapy for 2 days. 30 tablet 1  . levETIRAcetam (KEPPRA) 500 MG tablet TK 1 T PO BID  6  . lidocaine-prilocaine (EMLA) cream Apply to affected area once 30 g 3  . LORazepam (ATIVAN) 0.5 MG tablet Take 2 tablets (1 mg total) by mouth once as needed for up to 1 dose (prior to PET/CT and MRI brain for anxiety). 6 tablet 0  . magnesium chloride (SLOW-MAG) 64 MG TBEC SR tablet Take 2 tablets (128 mg total) by mouth daily. 60 tablet 1  . ondansetron (ZOFRAN) 8 MG tablet Take 1 tablet (8 mg total) by mouth 2 (two) times daily as needed for refractory nausea / vomiting. Start on day 3 after chemo. (Patient not taking: Reported on 09/17/2017) 30 tablet 1  . pantoprazole (PROTONIX) 40 MG tablet TK 1 T PO BID  6  . polycarbophil (FIBERCON) 625 MG tablet Take 2 tablets (1,250 mg total) by mouth 2 (two) times daily. 120 tablet 0  . potassium chloride SA (K-DUR,KLOR-CON) 20 MEQ tablet Take 1 tablet (20 mEq total) by mouth 2 (two) times daily. 60 tablet 0  . prochlorperazine (COMPAZINE) 10 MG tablet Take 1 tablet (10 mg total) by mouth every 6 (six) hours as needed (Nausea or vomiting). (Patient not taking: Reported on 09/17/2017) 30 tablet 1  . saccharomyces boulardii (FLORASTOR) 250 MG capsule Take 1 capsule (250 mg total) by mouth  2 (two) times daily. 100 capsule 0   No current facility-administered medications for this visit.     REVIEW OF SYSTEMS:   A 10+ POINT REVIEW OF SYSTEMS WAS OBTAINED including neurology, dermatology, psychiatry, cardiac, respiratory, lymph, extremities, GI, GU, Musculoskeletal, constitutional, breasts, reproductive, HEENT.  All pertinent positives are noted in the HPI.  All others are negative.     PHYSICAL EXAMINATION: ECOG PERFORMANCE STATUS: 1 - Symptomatic but completely ambulatory  Vitals:   09/17/17 1006  BP: (!) 130/58  Pulse: 71  Resp: 18  Temp: 97.8 F (36.6 C)  SpO2: 98%   Filed Weights   09/17/17 1006  Weight: 134 lb 9.6 oz (61.1 kg)   .Body mass index is 23.84 kg/m.  GENERAL:alert, in no acute distress and comfortable SKIN: no acute rashes, no significant lesions EYES: conjunctiva are pink and non-injected, sclera anicteric OROPHARYNX: MMM, no exudates, no oropharyngeal erythema or ulceration NECK: supple, no JVD LYMPH:  no palpable lymphadenopathy in the cervical, axillary or inguinal regions LUNGS: clear to auscultation b/l with normal respiratory effort HEART: regular rate & rhythm ABDOMEN:  normoactive bowel sounds , non tender, not distended. No palpable hepatosplenomegaly.  Extremity: no pedal edema PSYCH: alert & oriented x 3 with fluent speech NEURO: no focal motor/sensory deficits  =  LABORATORY DATA:  I have reviewed the data as listed  . CBC Latest Ref Rng & Units 09/17/2017 09/11/2017 09/03/2017  WBC 3.9 - 10.3 K/uL 5.1 5.5 6.1  Hemoglobin 11.6 - 15.9 g/dL 9.7(L) 11.1(L) 11.2(L)  Hematocrit 34.8 - 46.6 % 29.7(L) 32.7(L) 34.0(L)  Platelets 145 - 400 K/uL 133(L) 191 239    . CMP Latest Ref Rng & Units 09/11/2017 09/03/2017 08/05/2017  Glucose 70 - 99 mg/dL 110(H) 104(H) 95  BUN 8 - 23 mg/dL _0 Creatinine 0.44 - 1.00 mg/dL 0.70 0.74 0.71  Sodium 135 - 145 mmol/L 142 141 143  Potassium 3.5 -  5.1 mmol/L 4.1 4.4 3.6  Chloride 98 - 111 mmol/L  107 105 108  CO2 22 - 32 mmol/L _0 Calcium 8.9 - 10.3 mg/dL 9.7 10.0 9.7  Total Protein 6.5 - 8.1 g/dL 6.6 7.1 6.9  Total Bilirubin 0.3 - 1.2 mg/dL 0.8 0.5 0.2  Alkaline Phos 38 - 126 U/L 57 82 83  AST 15 - 41 U/L _1 ALT 0 - 44 U/L _2 05/19/17 Fine Needle Aspiration:  05/19/17 Bronchial Washing Specimen B:   RADIOGRAPHIC STUDIES: I have personally reviewed the radiological images as listed and agreed with the findings in the report. Mr Jeri Cos Wo Contrast  Result Date: 08/19/2017 CLINICAL DATA:  Non-small cell lung cancer staging. EXAM: MRI HEAD WITHOUT AND WITH CONTRAST TECHNIQUE: Multiplanar, multiecho pulse sequences of the brain and surrounding structures were obtained without and with intravenous contrast. CONTRAST:  52m MULTIHANCE GADOBENATE DIMEGLUMINE 529 MG/ML IV SOLN COMPARISON:  04/21/2017 FINDINGS: Brain: There is no evidence of acute infarct, intracranial hemorrhage, mass, midline shift, or extra-axial fluid collection. The brain is normal in signal, with the small regions of T2/FLAIR hyperintensity in the parietal lobes on the prior MRI having resolved. No abnormal enhancement is identified within limitations of mild motion artifact. Mild cerebral atrophy is unchanged and not necessarily greater than expected for patient's age. Vascular: Major intracranial vascular flow voids are preserved. Skull and upper cervical spine: Unremarkable bone marrow signal. Sinuses/Orbits: Unremarkable orbits. Paranasal sinuses and mastoid air cells are clear. Other: None. IMPRESSION: 1. No evidence of intracranial metastases. 2. Interval resolution of bilateral parietal signal abnormality favoring PRES. Electronically Signed   By: ALogan BoresM.D.   On: 08/19/2017 16:32   Nm Pet Image Restag (ps) Skull Base To Thigh  Result Date: 08/19/2017 CLINICAL DATA:  Subsequent treatment strategy for LUNG CANCER. EXAM: NUCLEAR MEDICINE PET SKULL BASE TO THIGH TECHNIQUE: 6.4 mCi F-18 FDG  was injected intravenously. Full-ring PET imaging was performed from the skull base to thigh after the radiotracer. CT data was obtained and used for attenuation correction and anatomic localization. Fasting blood glucose: 94 mg/dl COMPARISON:  05/14/2017 FINDINGS: Mediastinal blood pool activity: SUV max 94 NECK: Focus of increased uptake within the distal aspect of the tongue has an SUV max of 16.46. On the previous exam the SUV max was equal to 17.3. Incidental CT findings: none CHEST: The mass centered around the AP window is again noted. This measures 3.2 by 4.4 cm and has an SUV max of 14.04. Previously 3.1 x 4.3 cm within SUV max of 11.0. Previously noted left upper lobe ground-glass opacity has resolved in the interval compatible with an inflammatory or infectious process. Incidental CT findings: Moderate changes of centrilobular emphysema. Aortic atherosclerosis noted. Calcifications in the RCA, LAD and left circumflex coronary artery noted. ABDOMEN/PELVIS: No focal liver abnormality. The pancreas and spleen are unremarkable. Normal appearance of the adrenal glands. No hypermetabolic lymph nodes identified. Incidental CT findings: Aortic atherosclerosis. A filter is identified within the IVC. Aortic atherosclerosis without aneurysm. SKELETON: Interval decrease in radiotracer uptake throughout the axial and appendicular skeleton which is favored to represent treatment related changes. No suspicious bone lesions identified. Incidental CT findings: none IMPRESSION: 1. No significant change in size of hypermetabolic AP window mass. The degree of FDG uptake is slightly increased in the interval. 2. Persistent hypermetabolic focus involving the distal aspect of the tongue within SUV max of 16.46. Similar to previous exam. Correlation with direct  visualization is advised to exclude head/neck malignancy. 3.  Emphysema (ICD10-J43.9). 4. Aortic atherosclerosis and 3 vessel coronary artery atherosclerotic  calcifications. Aortic Atherosclerosis (ICD10-I70.0). Electronically Signed   By: Kerby Moors M.D.   On: 08/19/2017 15:57   MRI brain 04/21/2017: IMPRESSION: 1. Motion degraded examination. 2. 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 3. Otherwise negative MRI of the head with and without contrast for age.  Electronically Signed   By: Elon Alas M.D.   On: 04/22/2017 00:34    ASSESSMENT & PLAN:   62 y.o. female with  1. Recently diagnosed Non-Small Cell Carcinoma on bronchoscopy/cytology. Not enough tissue for further characterization or mutation testing. Presenting with mediastinal nodal mass - TxN2 Mx (Atleast Stage III disease) ? Lingular primary vs inflammation. No overt evidence of metastatic disease.  -EGFR mutation studies on blood negative. Patient is s/p 3 cycles of Carboplatin/taxol -resolution left upper lobe ground-glass opacity has resolved in the interval. 08/19/17 PET revealed  No significant change in size of hypermetabolic AP window mass. The degree of FDG uptake is slightly increased in the interval. 2. Persistent hypermetabolic focus involving the distal aspect of the tongue within SUV max of 16.46. Similar to previous exam. (no clinical co-relate). Lung involvement has decreased.   08/19/17 Brain MRI revealed No evidence of intracranial metastases. Interval resolution of bilateral parietal signal abnormality favoring PRES    2. Bone Pain from neulasta - managed with Tylenol  3. Recent SZ -currently on Keppra. MRI brain - resolution of findings of PRES  4. rt calf DVT - off anticoagulation due to massive GI bleeding. S/p IVC filter.  5. S/p Recent GI bleeding from Gastric ulcer --needing clipping and arterial embolization. hgb normal at 11.2 today. On PPI BID  6. Low Magnesium -- today at 1.6 (07/14/17) -On oral magnesium 275m BID  7. H/o ctx /steroid related thrush --now resolved.   PLAN:    -Discussed pt labwork today, 09/17/17; some anemia noted with HGB at 9.7, ANC normal at 3.7k, PLT at 133k.  -Will transfuse if HGB <8 -Will continue weekly chemotherapy until August 13 when radiation concludes, unless prohibitive toxicities or neutropenia develop -The pt has no prohibitive toxicities from continuing Carboplatin taxol with concurrent RT at this time.   -Discussed that immunotherapy/Durvalumab may play a role after radiation -Patient's lung capacity revealed moderate diffusion problems in addition to moderate obstruction before treatment  -Will repeat PET 8-12 weeks after treatment completion  Please schedule C4, C5 and C6 of weekly carbo/taxol with labs RTC with Kayla KIrene Limboin 2 weeks with C5 Weekly labs with port flush   All of the patients questions were answered with apparent satisfaction. The patient knows to call the clinic with any problems, questions or concerns.  The total time spent in the appt was 20 minutes and more than 50% was on counseling and direct patient cares.    GSullivan LoneMD MS AAHIVMS SLourdes Counseling CenterCNorthlake Endoscopy LLCHematology/Oncology Physician CNorth Country Orthopaedic [facility] (Office):       [phone](Work cell):  [phone](Fax):           [phone] 09/17/2017 10:15 AM  I, SBaldwin Jamaica am acting as a sEducation administratorfor Kayla KIrene Limbo   .I have reviewed the above documentation for accuracy and completeness, and I agree with the above. .Brunetta GeneraMD

## 2017-09-17 ENCOUNTER — Inpatient Hospital Stay: Payer: BLUE CROSS/BLUE SHIELD

## 2017-09-17 ENCOUNTER — Encounter: Payer: Self-pay | Admitting: Hematology

## 2017-09-17 ENCOUNTER — Ambulatory Visit
Admission: RE | Admit: 2017-09-17 | Discharge: 2017-09-17 | Disposition: A | Discharge status: Home / Self Care | Payer: BLUE CROSS/BLUE SHIELD | Source: Ambulatory Visit | Attending: Radiation Oncology | Admitting: Radiation Oncology

## 2017-09-17 ENCOUNTER — Telehealth: Payer: Self-pay | Admitting: Hematology

## 2017-09-17 ENCOUNTER — Inpatient Hospital Stay (HOSPITAL_BASED_OUTPATIENT_CLINIC_OR_DEPARTMENT_OTHER): Payer: BLUE CROSS/BLUE SHIELD | Admitting: Hematology

## 2017-09-17 VITALS — BP 130/58 | HR 71 | Temp 97.8°F | Resp 18 | Ht 63.0 in | Wt 134.6 lb

## 2017-09-17 DIAGNOSIS — Z5111 Encounter for antineoplastic chemotherapy: Secondary | ICD-10-CM

## 2017-09-17 DIAGNOSIS — C383 Malignant neoplasm of mediastinum, part unspecified: Secondary | ICD-10-CM

## 2017-09-17 DIAGNOSIS — D500 Iron deficiency anemia secondary to blood loss (chronic): Secondary | ICD-10-CM | POA: Diagnosis not present

## 2017-09-17 DIAGNOSIS — K254 Chronic or unspecified gastric ulcer with hemorrhage: Secondary | ICD-10-CM | POA: Diagnosis not present

## 2017-09-17 DIAGNOSIS — Z86718 Personal history of other venous thrombosis and embolism: Secondary | ICD-10-CM

## 2017-09-17 DIAGNOSIS — C3490 Malignant neoplasm of unspecified part of unspecified bronchus or lung: Secondary | ICD-10-CM

## 2017-09-17 DIAGNOSIS — R930 Abnormal findings on diagnostic imaging of skull and head, not elsewhere classified: Secondary | ICD-10-CM

## 2017-09-17 DIAGNOSIS — Z7189 Other specified counseling: Secondary | ICD-10-CM

## 2017-09-17 DIAGNOSIS — C3491 Malignant neoplasm of unspecified part of right bronchus or lung: Secondary | ICD-10-CM

## 2017-09-17 DIAGNOSIS — Z87891 Personal history of nicotine dependence: Secondary | ICD-10-CM

## 2017-09-17 LAB — CBC WITH DIFFERENTIAL (CANCER CENTER ONLY)
Basophils Absolute: 0 10*3/uL (ref 0.0–0.1)
Basophils Relative: 0 %
Eosinophils Absolute: 0 10*3/uL (ref 0.0–0.5)
Eosinophils Relative: 1 %
HCT: 29.7 % — ABNORMAL LOW (ref 34.8–46.6)
Hemoglobin: 9.7 g/dL — ABNORMAL LOW (ref 11.6–15.9)
Lymphocytes Relative: 20 %
Lymphs Abs: 1 10*3/uL (ref 0.9–3.3)
MCH: 32.2 pg (ref 25.1–34.0)
MCHC: 32.7 g/dL (ref 31.5–36.0)
MCV: 98.7 fL (ref 79.5–101.0)
Monocytes Absolute: 0.3 10*3/uL (ref 0.1–0.9)
Monocytes Relative: 7 %
Neutro Abs: 3.7 10*3/uL (ref 1.5–6.5)
Neutrophils Relative %: 72 %
Platelet Count: 133 10*3/uL — ABNORMAL LOW (ref 145–400)
RBC: 3.01 MIL/uL — ABNORMAL LOW (ref 3.70–5.45)
RDW: 19.5 % — ABNORMAL HIGH (ref 11.2–14.5)
WBC Count: 5.1 10*3/uL (ref 3.9–10.3)

## 2017-09-17 LAB — CMP (CANCER CENTER ONLY)
ALT: 12 U/L (ref 0–44)
AST: 14 U/L — ABNORMAL LOW (ref 15–41)
Albumin: 3.5 g/dL (ref 3.5–5.0)
Alkaline Phosphatase: 57 U/L (ref 38–126)
Anion gap: 6 (ref 5–15)
BUN: 17 mg/dL (ref 8–23)
CO2: 28 mmol/L (ref 22–32)
Calcium: 9.2 mg/dL (ref 8.9–10.3)
Chloride: 107 mmol/L (ref 98–111)
Creatinine: 0.76 mg/dL (ref 0.44–1.00)
GFR, Est AFR Am: >60 mL/min (ref >60)
GFR, Estimated: >60 mL/min (ref >60)
Glucose, Bld: 105 mg/dL — ABNORMAL HIGH (ref 70–99)
Potassium: 4.6 mmol/L (ref 3.5–5.1)
Sodium: 141 mmol/L (ref 135–145)
Total Bilirubin: 0.3 mg/dL (ref 0.3–1.2)
Total Protein: 5.9 g/dL — ABNORMAL LOW (ref 6.5–8.1)

## 2017-09-17 LAB — MAGNESIUM: Magnesium: 1.5 mg/dL — ABNORMAL LOW (ref 1.7–2.4)

## 2017-09-17 LAB — PHOSPHORUS: Phosphorus: 3.4 mg/dL (ref 2.5–4.6)

## 2017-09-17 MED ORDER — SODIUM CHLORIDE 0.9 % IV SOLN
Freq: Once | INTRAVENOUS | Status: AC
  Administered 2017-09-17: 13:00:00 via INTRAVENOUS

## 2017-09-17 MED ORDER — FAMOTIDINE IN NACL 20-0.9 MG/50ML-% IV SOLN
INTRAVENOUS | Status: AC
Start: 2017-09-17 — End: ?
  Filled 2017-09-17: qty 50

## 2017-09-17 MED ORDER — SODIUM CHLORIDE 0.9 % IV SOLN
45.0000 mg/m2 | Freq: Once | INTRAVENOUS | Status: AC
  Administered 2017-09-17: 72 mg via INTRAVENOUS
  Filled 2017-09-17: qty 12

## 2017-09-17 MED ORDER — SODIUM CHLORIDE 0.9% FLUSH
10.0000 mL | INTRAVENOUS | Status: DC | PRN
  Administered 2017-09-17: 10 mL
  Filled 2017-09-17: qty 10

## 2017-09-17 MED ORDER — DIPHENHYDRAMINE HCL 50 MG/ML IJ SOLN
INTRAMUSCULAR | Status: AC
  Filled 2017-09-17: qty 1

## 2017-09-17 MED ORDER — SODIUM CHLORIDE 0.9 % IV SOLN
20.0000 mg | Freq: Once | INTRAVENOUS | Status: AC
  Administered 2017-09-17: 20 mg via INTRAVENOUS
  Filled 2017-09-17: qty 2

## 2017-09-17 MED ORDER — PALONOSETRON HCL INJECTION 0.25 MG/5ML
INTRAVENOUS | Status: AC
  Filled 2017-09-17: qty 5

## 2017-09-17 MED ORDER — FAMOTIDINE IN NACL 20-0.9 MG/50ML-% IV SOLN
20.0000 mg | Freq: Once | INTRAVENOUS | Status: AC
  Administered 2017-09-17: 20 mg via INTRAVENOUS

## 2017-09-17 MED ORDER — HEPARIN SOD (PORK) LOCK FLUSH 100 UNIT/ML IV SOLN
500.0000 [IU] | Freq: Once | INTRAVENOUS | Status: AC | PRN
  Administered 2017-09-17: 500 [IU]
  Filled 2017-09-17: qty 5

## 2017-09-17 MED ORDER — SODIUM CHLORIDE 0.9 % IV SOLN
180.0000 mg | Freq: Once | INTRAVENOUS | Status: AC
  Administered 2017-09-17: 180 mg via INTRAVENOUS
  Filled 2017-09-17: qty 18

## 2017-09-17 MED ORDER — PALONOSETRON HCL INJECTION 0.25 MG/5ML
0.2500 mg | Freq: Once | INTRAVENOUS | Status: AC
  Administered 2017-09-17: 0.25 mg via INTRAVENOUS

## 2017-09-17 MED ORDER — DIPHENHYDRAMINE HCL 50 MG/ML IJ SOLN
50.0000 mg | Freq: Once | INTRAMUSCULAR | Status: AC
  Administered 2017-09-17: 50 mg via INTRAVENOUS

## 2017-09-17 NOTE — Telephone Encounter (Signed)
Scheduled appt per 7/11 los - no f/u added due to availability on [place] schedule - will ask MD when appt can be added - will call patient when added.

## 2017-09-17 NOTE — Patient Instructions (Signed)
Kayla Wyatt Discharge Instructions for Patients Receiving Chemotherapy  Today you received the following chemotherapy agents Taxol, carboplatin To help prevent nausea and vomiting after your treatment, we encourage you to take your nausea medication as directed If you develop nausea and vomiting that is not controlled by your nausea medication, call the clinic.   BELOW ARE SYMPTOMS THAT SHOULD BE REPORTED IMMEDIATELY:  *FEVER GREATER THAN 100.5 F  *CHILLS WITH OR WITHOUT FEVER  NAUSEA AND VOMITING THAT IS NOT CONTROLLED WITH YOUR NAUSEA MEDICATION  *UNUSUAL SHORTNESS OF BREATH  *UNUSUAL BRUISING OR BLEEDING  TENDERNESS IN MOUTH AND THROAT WITH OR WITHOUT PRESENCE OF ULCERS  *URINARY PROBLEMS  *BOWEL PROBLEMS  UNUSUAL RASH Items with * indicate a potential emergency and should be followed up as soon as possible.  Feel free to call the clinic should you have any questions or concerns. The clinic phone number is [phone].  Please show the [name] at check-in to the Emergency Department and triage nurse.

## 2017-09-18 ENCOUNTER — Ambulatory Visit
Admission: RE | Admit: 2017-09-18 | Discharge: 2017-09-18 | Disposition: A | Discharge status: Home / Self Care | Payer: BLUE CROSS/BLUE SHIELD | Source: Ambulatory Visit | Attending: Radiation Oncology | Admitting: Radiation Oncology

## 2017-09-18 DIAGNOSIS — C383 Malignant neoplasm of mediastinum, part unspecified: Secondary | ICD-10-CM | POA: Diagnosis not present

## 2017-09-21 ENCOUNTER — Ambulatory Visit
Admission: RE | Admit: 2017-09-21 | Discharge: 2017-09-21 | Disposition: A | Discharge status: Home / Self Care | Payer: BLUE CROSS/BLUE SHIELD | Source: Ambulatory Visit | Attending: Radiation Oncology | Admitting: Radiation Oncology

## 2017-09-21 DIAGNOSIS — C383 Malignant neoplasm of mediastinum, part unspecified: Secondary | ICD-10-CM | POA: Diagnosis not present

## 2017-09-22 ENCOUNTER — Ambulatory Visit (HOSPITAL_COMMUNITY)
Admission: RE | Admit: 2017-09-22 | Discharge: 2017-09-22 | Disposition: A | Discharge status: Home / Self Care | Payer: BLUE CROSS/BLUE SHIELD | Source: Ambulatory Visit | Attending: Radiation Oncology | Admitting: Radiation Oncology

## 2017-09-22 ENCOUNTER — Ambulatory Visit
Admission: RE | Admit: 2017-09-22 | Discharge: 2017-09-22 | Disposition: A | Discharge status: Home / Self Care | Payer: BLUE CROSS/BLUE SHIELD | Source: Ambulatory Visit | Attending: Radiation Oncology | Admitting: Radiation Oncology

## 2017-09-22 ENCOUNTER — Ambulatory Visit: Payer: BLUE CROSS/BLUE SHIELD | Admitting: Cardiology

## 2017-09-22 ENCOUNTER — Other Ambulatory Visit: Payer: Self-pay | Admitting: Radiation Oncology

## 2017-09-22 ENCOUNTER — Telehealth: Payer: Self-pay | Admitting: *Deleted

## 2017-09-22 DIAGNOSIS — R600 Localized edema: Secondary | ICD-10-CM

## 2017-09-22 DIAGNOSIS — C383 Malignant neoplasm of mediastinum, part unspecified: Secondary | ICD-10-CM | POA: Diagnosis not present

## 2017-09-22 DIAGNOSIS — I871 Compression of vein: Secondary | ICD-10-CM | POA: Insufficient documentation

## 2017-09-22 NOTE — Progress Notes (Signed)
Pt presents to clinic from treatment area with c/o RLE calf swelling, tightness. RLE is warm to touch and skin is taut. Pt reports pain behind knee. ADara Lords, PA-C to eval pt. Pt aware. Loma Sousa, RN BSN

## 2017-09-22 NOTE — Progress Notes (Signed)
*  Preliminary Results* Right lower extremity venous duplex completed. Right lower extremity is positive for deep vein thrombosis in the femoral vein, popliteal vein, posterior tibial vein and peroneal veins.  There is no evidence of right Baker's cyst.  09/22/2017 5:05 PM  Jinny Blossom Dawna Part

## 2017-09-22 NOTE — Telephone Encounter (Signed)
CALLED PATIENT TO INFORM OF APPT. US/DOPPLER FOR 09-22-17 - ARRIVAL TIME - 4 PM @ [facility] ADMITTING, NO RESTRICTIONS TO TEST, SPOKE WITH PATIENT AND SHE IS AWARE OF THIS TEST

## 2017-09-22 NOTE — Progress Notes (Signed)
I saw the patient and she started noticing increasing right lower extremity pain and edema. She has a history of DVT and Xarelto led to an acute GI bleed that required EGD intervention with embolization and clipping of gastric vessels. She apparently had an IVC filter placed during that January 2019 incident which also was around the time that she was diagnosed with her cancer. Today she presented in clinic and her vitals are seen below:  Wt Readings from Last 3 Encounters:  09/17/17 134 lb 9.6 oz (61.1 kg)  09/09/17 130 lb (59 kg)  09/03/17 130 lb 12.8 oz (59.3 kg)   Temp Readings from Last 3 Encounters:  09/22/17 98.2 F (36.8 C) (Oral)  09/17/17 97.8 F (36.6 C) (Oral)  09/11/17 97.6 F (36.4 C) (Oral)   BP Readings from Last 3 Encounters:  09/22/17 120/60  09/17/17 (!) 130/58  09/11/17 (!) 129/52   Pulse Readings from Last 3 Encounters:  09/22/17 72  09/17/17 71  09/11/17 (!) 58    In general this is a chronically fatigued appearing caucasian female in no acute distress. She's alert and oriented x4 and appropriate throughout the examination. Cardiopulmonary assessment is negative for acute distress and she exhibits normal effort. The RLE has 1+ pitting edema from the dorsal aspect of her foot with multiple varicosities. She has mildly positive Homan's sign. Palpable pulses are noted at the dorsalis pedis, and posterior tibialis. No edema or deep calf tenderness is noted on the LLE.   I discussed her case with Dr. Irene Limbo. We will proceed with stat Doppler Ultrasound to rule out DVT, and if she has an acute DVT, he would recommend BID Lovenox dosing. We will follow up though with results before making definitive treatment plans.     Carola Rhine, PAC

## 2017-09-23 ENCOUNTER — Ambulatory Visit
Admission: RE | Admit: 2017-09-23 | Discharge: 2017-09-23 | Disposition: A | Discharge status: Home / Self Care | Payer: BLUE CROSS/BLUE SHIELD | Source: Ambulatory Visit | Attending: Radiation Oncology | Admitting: Radiation Oncology

## 2017-09-23 DIAGNOSIS — C383 Malignant neoplasm of mediastinum, part unspecified: Secondary | ICD-10-CM | POA: Diagnosis not present

## 2017-09-24 ENCOUNTER — Ambulatory Visit
Admission: RE | Admit: 2017-09-24 | Discharge: 2017-09-24 | Disposition: A | Discharge status: Home / Self Care | Payer: BLUE CROSS/BLUE SHIELD | Source: Ambulatory Visit | Attending: Radiation Oncology | Admitting: Radiation Oncology

## 2017-09-24 ENCOUNTER — Other Ambulatory Visit: Payer: Self-pay | Admitting: Radiation Oncology

## 2017-09-24 DIAGNOSIS — C383 Malignant neoplasm of mediastinum, part unspecified: Secondary | ICD-10-CM | POA: Diagnosis not present

## 2017-09-24 MED ORDER — SUCRALFATE 1 G PO TABS: 1.0000 g | ORAL_TABLET | Freq: Three times a day (TID) | ORAL | 1 refills | Status: DC

## 2017-09-24 MED ORDER — SUCRALFATE 1 G PO TABS: ORAL_TABLET | ORAL | 1 refills | Status: DC

## 2017-09-24 NOTE — Progress Notes (Signed)
The patient was sent for eval on Tuesday and her Doppler was positive. She needed 60 mg lovenox BID. Her pharmacy was able to give her (2) 30 mg injections to total 4 doses, and ordered 60 mg for her. [facility] confirmed today they have her medication and will notify her by text message today. A new prescription was also sent in for her Carafate at her preferred pharmacy of [place].

## 2017-09-25 ENCOUNTER — Inpatient Hospital Stay: Payer: BLUE CROSS/BLUE SHIELD

## 2017-09-25 ENCOUNTER — Ambulatory Visit
Admission: RE | Admit: 2017-09-25 | Discharge: 2017-09-25 | Disposition: A | Discharge status: Home / Self Care | Payer: BLUE CROSS/BLUE SHIELD | Source: Ambulatory Visit | Attending: Radiation Oncology | Admitting: Radiation Oncology

## 2017-09-25 VITALS — BP 134/64 | HR 71 | Temp 98.0°F | Resp 20 | Wt 131.0 lb

## 2017-09-25 DIAGNOSIS — C383 Malignant neoplasm of mediastinum, part unspecified: Secondary | ICD-10-CM

## 2017-09-25 DIAGNOSIS — Z95828 Presence of other vascular implants and grafts: Secondary | ICD-10-CM | POA: Insufficient documentation

## 2017-09-25 DIAGNOSIS — Z7189 Other specified counseling: Secondary | ICD-10-CM

## 2017-09-25 DIAGNOSIS — C3490 Malignant neoplasm of unspecified part of unspecified bronchus or lung: Secondary | ICD-10-CM | POA: Diagnosis not present

## 2017-09-25 DIAGNOSIS — C3491 Malignant neoplasm of unspecified part of right bronchus or lung: Secondary | ICD-10-CM

## 2017-09-25 LAB — CBC WITH DIFFERENTIAL/PLATELET
Basophils Absolute: 0 10*3/uL (ref 0.0–0.1)
Basophils Relative: 0 %
Eosinophils Absolute: 0 10*3/uL (ref 0.0–0.5)
Eosinophils Relative: 1 %
HCT: 28.7 % — ABNORMAL LOW (ref 34.8–46.6)
Hemoglobin: 9.7 g/dL — ABNORMAL LOW (ref 11.6–15.9)
Lymphocytes Relative: 17 %
Lymphs Abs: 0.6 10*3/uL — ABNORMAL LOW (ref 0.9–3.3)
MCH: 33.8 pg (ref 25.1–34.0)
MCHC: 33.8 g/dL (ref 31.5–36.0)
MCV: 100.1 fL (ref 79.5–101.0)
Monocytes Absolute: 0.3 10*3/uL (ref 0.1–0.9)
Monocytes Relative: 10 %
Neutro Abs: 2.4 10*3/uL (ref 1.5–6.5)
Neutrophils Relative %: 72 %
Platelets: 126 10*3/uL — ABNORMAL LOW (ref 145–400)
RBC: 2.87 MIL/uL — ABNORMAL LOW (ref 3.70–5.45)
RDW: 19.2 % — ABNORMAL HIGH (ref 11.2–14.5)
WBC: 3.3 10*3/uL — ABNORMAL LOW (ref 3.9–10.3)

## 2017-09-25 LAB — CMP (CANCER CENTER ONLY)
ALT: 15 U/L (ref 0–44)
AST: 16 U/L (ref 15–41)
Albumin: 3.6 g/dL (ref 3.5–5.0)
Alkaline Phosphatase: 65 U/L (ref 38–126)
Anion gap: 8 (ref 5–15)
BUN: 10 mg/dL (ref 8–23)
CO2: 25 mmol/L (ref 22–32)
Calcium: 9.5 mg/dL (ref 8.9–10.3)
Chloride: 109 mmol/L (ref 98–111)
Creatinine: 0.7 mg/dL (ref 0.44–1.00)
GFR, Est AFR Am: >60 mL/min (ref >60)
GFR, Estimated: >60 mL/min (ref >60)
Glucose, Bld: 110 mg/dL — ABNORMAL HIGH (ref 70–99)
Potassium: 3.9 mmol/L (ref 3.5–5.1)
Sodium: 142 mmol/L (ref 135–145)
Total Bilirubin: 0.4 mg/dL (ref 0.3–1.2)
Total Protein: 6.5 g/dL (ref 6.5–8.1)

## 2017-09-25 LAB — MAGNESIUM: Magnesium: 1.5 mg/dL — ABNORMAL LOW (ref 1.7–2.4)

## 2017-09-25 MED ORDER — PALONOSETRON HCL INJECTION 0.25 MG/5ML
0.2500 mg | Freq: Once | INTRAVENOUS | Status: AC
  Administered 2017-09-25: 0.25 mg via INTRAVENOUS

## 2017-09-25 MED ORDER — HEPARIN SOD (PORK) LOCK FLUSH 100 UNIT/ML IV SOLN
500.0000 [IU] | Freq: Once | INTRAVENOUS | Status: AC | PRN
  Administered 2017-09-25: 500 [IU]
  Filled 2017-09-25: qty 5

## 2017-09-25 MED ORDER — SODIUM CHLORIDE 0.9% FLUSH
10.0000 mL | INTRAVENOUS | Status: DC | PRN
  Administered 2017-09-25: 10 mL
  Filled 2017-09-25: qty 10

## 2017-09-25 MED ORDER — SODIUM CHLORIDE 0.9 % IV SOLN
180.0000 mg | Freq: Once | INTRAVENOUS | Status: AC
  Administered 2017-09-25: 180 mg via INTRAVENOUS
  Filled 2017-09-25: qty 18

## 2017-09-25 MED ORDER — SODIUM CHLORIDE 0.9 % IV SOLN
45.0000 mg/m2 | Freq: Once | INTRAVENOUS | Status: AC
  Administered 2017-09-25: 72 mg via INTRAVENOUS
  Filled 2017-09-25: qty 12

## 2017-09-25 MED ORDER — SODIUM CHLORIDE 0.9 % IV SOLN
Freq: Once | INTRAVENOUS | Status: AC
  Administered 2017-09-25: 12:00:00 via INTRAVENOUS

## 2017-09-25 MED ORDER — SODIUM CHLORIDE 0.9 % IV SOLN
20.0000 mg | Freq: Once | INTRAVENOUS | Status: AC
  Administered 2017-09-25: 20 mg via INTRAVENOUS
  Filled 2017-09-25: qty 2

## 2017-09-25 MED ORDER — DIPHENHYDRAMINE HCL 50 MG/ML IJ SOLN
INTRAMUSCULAR | Status: AC
  Filled 2017-09-25: qty 1

## 2017-09-25 MED ORDER — FAMOTIDINE IN NACL 20-0.9 MG/50ML-% IV SOLN
20.0000 mg | Freq: Once | INTRAVENOUS | Status: AC
  Administered 2017-09-25: 20 mg via INTRAVENOUS

## 2017-09-25 MED ORDER — PALONOSETRON HCL INJECTION 0.25 MG/5ML
INTRAVENOUS | Status: AC
  Filled 2017-09-25: qty 5

## 2017-09-25 MED ORDER — FAMOTIDINE IN NACL 20-0.9 MG/50ML-% IV SOLN
INTRAVENOUS | Status: AC
Start: 2017-09-25 — End: ?
  Filled 2017-09-25: qty 50

## 2017-09-25 MED ORDER — DIPHENHYDRAMINE HCL 50 MG/ML IJ SOLN
50.0000 mg | Freq: Once | INTRAMUSCULAR | Status: AC
  Administered 2017-09-25: 50 mg via INTRAVENOUS

## 2017-09-25 NOTE — Patient Instructions (Signed)
[facility] Discharge Instructions for Patients Receiving Chemotherapy  Today you received the following chemotherapy agents Taxol, Carboplatin  To help prevent nausea and vomiting after your treatment, we encourage you to take your nausea medication as directed  If you develop nausea and vomiting that is not controlled by your nausea medication, call the clinic.   BELOW ARE SYMPTOMS THAT SHOULD BE REPORTED IMMEDIATELY:  *FEVER GREATER THAN 100.5 F  *CHILLS WITH OR WITHOUT FEVER  NAUSEA AND VOMITING THAT IS NOT CONTROLLED WITH YOUR NAUSEA MEDICATION  *UNUSUAL SHORTNESS OF BREATH  *UNUSUAL BRUISING OR BLEEDING  TENDERNESS IN MOUTH AND THROAT WITH OR WITHOUT PRESENCE OF ULCERS  *URINARY PROBLEMS  *BOWEL PROBLEMS  UNUSUAL RASH Items with * indicate a potential emergency and should be followed up as soon as possible.  Feel free to call the clinic should you have any questions or concerns. The clinic phone number is [phone].  Please show the CHEMO ALERT CARD at check-in to the Emergency Department and triage nurse.   

## 2017-09-28 ENCOUNTER — Ambulatory Visit
Admission: RE | Admit: 2017-09-28 | Discharge: 2017-09-28 | Disposition: A | Discharge status: Home / Self Care | Payer: BLUE CROSS/BLUE SHIELD | Source: Ambulatory Visit | Attending: Radiation Oncology | Admitting: Radiation Oncology

## 2017-09-28 DIAGNOSIS — C383 Malignant neoplasm of mediastinum, part unspecified: Secondary | ICD-10-CM | POA: Diagnosis not present

## 2017-09-29 ENCOUNTER — Ambulatory Visit
Admission: RE | Admit: 2017-09-29 | Discharge: 2017-09-29 | Disposition: A | Discharge status: Home / Self Care | Payer: BLUE CROSS/BLUE SHIELD | Source: Ambulatory Visit | Attending: Radiation Oncology | Admitting: Radiation Oncology

## 2017-09-29 DIAGNOSIS — C383 Malignant neoplasm of mediastinum, part unspecified: Secondary | ICD-10-CM | POA: Diagnosis not present

## 2017-09-30 ENCOUNTER — Ambulatory Visit
Admission: RE | Admit: 2017-09-30 | Discharge: 2017-09-30 | Disposition: A | Discharge status: Home / Self Care | Payer: BLUE CROSS/BLUE SHIELD | Source: Ambulatory Visit | Attending: Radiation Oncology | Admitting: Radiation Oncology

## 2017-09-30 DIAGNOSIS — C383 Malignant neoplasm of mediastinum, part unspecified: Secondary | ICD-10-CM | POA: Diagnosis not present

## 2017-09-30 NOTE — Progress Notes (Signed)
HEMATOLOGY/ONCOLOGY CLINIC NOTE  Date of Service: 10/01/2017   Patient Care Team: [name], MD as PCP - General (Family Medicine)  CHIEF COMPLAINTS/PURPOSE OF CONSULTATION:  F/u for continued management of recently diagnosed Non-Small Cell lung Carcinoma  HISTORY OF PRESENTING ILLNESS:   Kayla Wyatt is a wonderful 62 y.o. female who has been referred to [place] by Dr [name] for evaluation and management of Non-Small Cell Carcinoma. She is accompanied today by her husband. The pt reports that she is doing well overall.   Patient recently had a significant hospitalization on 04/02/2017 - as per DC summary by [name] " with abdominal pain, hematemesis, melena and ABLA due to GIB.  She underwent clipping of gastric ulcer and received multiple units PRBC but continued to have drop in H/H with bloody stool. NM GI scan was negative for bleeding and she underwent colonoscopy by Dr. [name] 1/31 showing diverticulosis but no signs of bleeding. Hospital course significant for R- gastrocnemius DVT, PAF, incidental findings of mediastinal mass as well as brief episode of hematuria. She was cleared to start on Xarelto on 02/01 for treatment of DVT. On 2/2 am she developed hematemesis with maroon stools and hypotension requiring fluid bolus as well as 2 units PRBCs. She continued to decline requiring intubation, pressors as well as reversal of anticoagulation with Kcentra on 02/3. She underwent UGI revealing large clot with fresh blood in stomach and underwent mesenteric arteriogram with percutaneous coil embolization fo distal tributary of left gastric artery and placement of IVC filter by interventional radiology.   A fib with RVR felt to be due to hemorrhagic shock and she converted to NSR. [name] felt no further cardiac work up indicated and did not recommend initiating anticoagulation in short term. To follow up with [name] after PET scan and routine biopsy after medically  stable. She continued to have bleeding and underwent visceral angiogram with embolization of inferior division of splenic artery and associated gastric arteries on 2/4. No surgical intervention needed per [name] with recommendations to continue monitoring H/H as well as for recurrence of hematochezia. She tolerated extubation on 2/6 and respiratory status improving.  On 2/11, she has episode of unresponsiveness with jerking of bilateral limbs that lasted about 3 minutes. She had amnesia of events but was back to baseline. EEG revealed "focal slowing over the right temporo-occipital regionandoccasional epileptiform discharges over the right occipital region". Head CT reviewed, unremarkable for acute intracranial process. MRI brain done revealing mild biparietal signal abnormality suspicious for PRES. [name] recommended starting patient on Keppra BID with repeat MRI in 2 weeks. New onset seizure likely provoked by PRES--if resolved--Ok to take patient off Keppra. Patient with resultant generalized weakness. CIR recommended due to functional deficits. "   Later as outpatient she had a PET scan on 05/14/17 which showed a hypermetabolic mediastinal mass which was subsequently biopsied with a bronchoscopy on 05/19/17. She notes that she had smoked for about 35-40 years, half a pack of a day. She reports that she has not had any other symptoms related to her smoking; she has never had to be on home oxygen or inhalers. She has recently begun Symbicort. While being in the hospital she lost about 20 lbs but has maintained her weight since being discharged.  The pt and her husband note that she is concerned about what to do if she is not able to go back to work after her 90-day leave. We recommended that they speak with  our social workers here to explore the available options for insurance coverage /disability application etc.  Of note prior to the patient's visit today, pt has had PET/CT completed on  05/14/17 with results revealing 1. The known mediastinal mass is intensely hypermetabolic, worrisome for malignancy. This could reflect lymphoma or small cell lung cancer. Tissue sampling recommended.2. No peripheral lung mass identified. New patchy ground-glass opacity in the lingula is mildly hypermetabolic and likely inflammatory.  On 05/19/17 the pt had a Fine Needle Aspiration which revealed Non-Small cell lung carcinoma.  Most recent lab results (05/19/17) of CBC  is as follows: all values are WNL except for RBC at 2.90, Hgb at 8.6, HCT at 28.0, RDW at 16.4.  On review of systems, pt reports knee pain, and denies abdominal pains, leg swelling, SOB, CP, and any other symptoms.   On PMHx the pt reports acute blood loss anemia, GI hemorrhage with melena, hypercholesteremia, HTN, persistent atrial fibrillation with rapid ventricular response, Vitamin D deficiency, denies liver and kidney problems. On Social Hx the pt reports having smoked for 35-40 years of half a pack per day. She quit smoking on 03/31/17.  On Family Hx the pt reports that her father had heart disease.  Interval History:   Kayla Wyatt returns today regarding her Non-Small Cell Carcinoma. She is here for concurrent Carboplatin/taxol and radiation treatment. The patient's last visit with [place] was on 09/17/17. She is accompanied today by her husband. The pt reports that she is doing well overall.   The pt reports that she has not had any concerning symptoms since her last visit. She notes that she is eating well and has not had more difficulty breathing. She is continuing to attend radiation with her last dose set for 10/20/17.   She is taking sucralfate for some discomfort swallowing.   Lab results today (10/01/17) of CBC w/diff, CMP, and Reticulocytes is as follows: all values are WNL except for WBC at 2.3k, RBC at 3.04, HGB at 10.4, HCT at 31.0, MCV at 101.9, MCH at 34.2, RDW at 17.3, PLT at 105k, Lymphs abs at 600, Glucose at  124, Total Protein at 6.3. Phosphorous 10/01/17 is WNL at 3.0 Magnesium 10/01/17 is low at 1.4  On review of systems, pt reports eating well, controlled discomfort when swallowing, and denies mouth sores, breathing difficulty, abdominal pains, and any other symptoms.   MEDICAL HISTORY:  Past Medical History:  Diagnosis Date  . Acute blood loss anemia 04/02/2017  . Anxiety   . Gastrointestinal hemorrhage with melena   . Headache   . Hypercholesteremia   . Hypertension   . Persistent atrial fibrillation with rapid ventricular response ([place]) 04/07/2017  . Seizures ([place])    04/2017 while in hospital at [facility]  . Vitamin D deficiency     SURGICAL HISTORY: Past Surgical History:  Procedure Laterality Date  . COLONOSCOPY Left 04/09/2017   Procedure: COLONOSCOPY;  Surgeon: Carol Ada, MD;  Location: [facility];  Service: Endoscopy;  Laterality: Left;  . ESOPHAGOGASTRODUODENOSCOPY N/A 04/03/2017   Procedure: ESOPHAGOGASTRODUODENOSCOPY (EGD);  Surgeon: Carol Ada, MD;  Location: [place];  Service: Endoscopy;  Laterality: N/A;  . ESOPHAGOGASTRODUODENOSCOPY (EGD) WITH PROPOFOL N/A 04/12/2017   Procedure: ESOPHAGOGASTRODUODENOSCOPY (EGD) WITH PROPOFOL;  Surgeon: Milus Banister, MD;  Location: [facility] ENDOSCOPY;  Service: Endoscopy;  Laterality: N/A;  . IR ANGIOGRAM SELECTIVE EACH ADDITIONAL VESSEL  04/12/2017  . IR ANGIOGRAM SELECTIVE EACH ADDITIONAL VESSEL  04/13/2017  . IR ANGIOGRAM SELECTIVE EACH ADDITIONAL VESSEL  04/13/2017  .  IR ANGIOGRAM SELECTIVE EACH ADDITIONAL VESSEL  04/13/2017  . IR ANGIOGRAM VISCERAL SELECTIVE  04/12/2017  . IR ANGIOGRAM VISCERAL SELECTIVE  04/13/2017  . IR EMBO ART  VEN HEMORR LYMPH EXTRAV  INC GUIDE ROADMAPPING  04/12/2017  . IR EMBO ART  VEN HEMORR LYMPH EXTRAV  INC GUIDE ROADMAPPING  04/13/2017  . IR FLUORO GUIDE CV LINE RIGHT  04/13/2017  . IR FLUORO GUIDE PORT INSERTION RIGHT  07/27/2017  . IR IVC FILTER PLMT / S&I /IMG GUID/MOD SED  04/12/2017  . IR US GUIDE VASC ACCESS  RIGHT  04/12/2017  . IR US GUIDE VASC ACCESS RIGHT  04/13/2017  . IR US GUIDE VASC ACCESS RIGHT  04/13/2017  . IR US GUIDE VASC ACCESS RIGHT  07/27/2017  . LEG SURGERY  1974   Blood Clot Removal   . VIDEO BRONCHOSCOPY WITH ENDOBRONCHIAL ULTRASOUND N/A 05/19/2017   Procedure: VIDEO BRONCHOSCOPY WITH ENDOBRONCHIAL ULTRASOUND;  Surgeon: Grace Isaac, MD;  Location: [facility];  Service: Thoracic;  Laterality: N/A;    SOCIAL HISTORY: Social History   Socioeconomic History  . Marital status: Single    Spouse name: Not on file  . Number of children: Not on file  . Years of education: Not on file  . Highest education level: Not on file  Occupational History  . Occupation: Has to lift heavy boxes at times.    Employer: [employer]  Social Needs  . Financial resource strain: Not on file  . Food insecurity:    Worry: Not on file    Inability: Not on file  . Transportation needs:    Medical: Not on file    Non-medical: Not on file  Tobacco Use  . Smoking status: Former Smoker    Packs/day: 0.50    Years: 40.00    Pack years: 20.00    Types: Cigarettes    Last attempt to quit: 03/31/2017    Years since quitting: 0.5  . Smokeless tobacco: Never Used  Substance and Sexual Activity  . Alcohol use: No    Frequency: Never  . Drug use: No  . Sexual activity: Not Currently  Lifestyle  . Physical activity:    Days per week: Not on file    Minutes per session: Not on file  . Stress: Not on file  Relationships  . Social connections:    Talks on phone: Not on file    Gets together: Not on file    Attends religious service: Not on file    Active member of club or organization: Not on file    Attends meetings of clubs or organizations: Not on file    Relationship status: Not on file  . Intimate partner violence:    Fear of current or ex partner: Not on file    Emotionally abused: Not on file    Physically abused: Not on file    Forced sexual activity: Not on file  Other Topics Concern  .  Not on file  Social History Narrative  . Not on file    FAMILY HISTORY: Family History  Problem Relation Age of Onset  . Heart disease Father     ALLERGIES:  has No Known Allergies.  MEDICATIONS:  Current Outpatient Medications  Medication Sig Dispense Refill  . acetaminophen (TYLENOL) 325 MG tablet Take 1-2 tablets (325-650 mg total) by mouth every 4 (four) hours as needed for mild pain.    . budesonide-formoterol (SYMBICORT) 80-4.5 MCG/ACT inhaler Inhale 2 puffs into the lungs 2 (  two) times daily. 1 Inhaler 12  . cholecalciferol (VITAMIN D) 1000 units tablet Take 1,000 Units by mouth 2 (two) times daily.    [name] dexamethasone (DECADRON) 4 MG tablet Take 2 tablets (8 mg total) by mouth daily. Start the day after chemotherapy for 2 days. 30 tablet 1  . levETIRAcetam (KEPPRA) 500 MG tablet TK 1 T PO BID  6  . lidocaine-prilocaine (EMLA) cream Apply to affected area once 30 g 3  . LORazepam (ATIVAN) 0.5 MG tablet Take 2 tablets (1 mg total) by mouth once as needed for up to 1 dose (prior to PET/CT and MRI brain for anxiety). 6 tablet 0  . magnesium chloride (SLOW-MAG) 64 MG TBEC SR tablet Take 2 tablets (128 mg total) by mouth daily. 60 tablet 1  . ondansetron (ZOFRAN) 8 MG tablet Take 1 tablet (8 mg total) by mouth 2 (two) times daily as needed for refractory nausea / vomiting. Start on day 3 after chemo. (Patient not taking: Reported on 09/17/2017) 30 tablet 1  . pantoprazole (PROTONIX) 40 MG tablet TK 1 T PO BID  6  . polycarbophil (FIBERCON) 625 MG tablet Take 2 tablets (1,250 mg total) by mouth 2 (two) times daily. 120 tablet 0  . potassium chloride SA (K-DUR,KLOR-CON) 20 MEQ tablet Take 1 tablet (20 mEq total) by mouth 2 (two) times daily. 60 tablet 0  . prochlorperazine (COMPAZINE) 10 MG tablet Take 1 tablet (10 mg total) by mouth every 6 (six) hours as needed (Nausea or vomiting). (Patient not taking: Reported on 09/17/2017) 30 tablet 1  . saccharomyces boulardii (FLORASTOR) 250 MG  capsule Take 1 capsule (250 mg total) by mouth 2 (two) times daily. 100 capsule 0  . sucralfate (CARAFATE) 1 g tablet Crush and mix in 10-15 mL of water prior to swallowing qAC and HS 120 tablet 1   No current facility-administered medications for this visit.    Facility-Administered Medications Ordered in Other Visits  Medication Dose Route Frequency Provider Last Rate Last Dose  . CARBOplatin (PARAPLATIN) 180 mg in sodium chloride 0.9 % 250 mL chemo infusion  180 mg Intravenous Once Brunetta Genera, MD      . heparin lock flush 100 unit/mL  500 Units Intracatheter Once PRN Brunetta Genera, MD      . magnesium sulfate IVPB 2 g 50 mL  2 g Intravenous Once Brunetta Genera, MD 50 mL/hr at 10/01/17 1252 2 g at 10/01/17 1252  . PACLitaxel (TAXOL) 72 mg in sodium chloride 0.9 % 150 mL chemo infusion (</= '80mg'$ /m2)  45 mg/m2 (Treatment Plan Recorded) Intravenous Once Brunetta Genera, MD      . sodium chloride flush (NS) 0.9 % injection 10 mL  10 mL Intracatheter PRN Brunetta Genera, MD        REVIEW OF SYSTEMS:   A 10+ POINT REVIEW OF SYSTEMS WAS OBTAINED including neurology, dermatology, psychiatry, cardiac, respiratory, lymph, extremities, GI, GU, Musculoskeletal, constitutional, breasts, reproductive, HEENT.  All pertinent positives are noted in the HPI.  All others are negative.   PHYSICAL EXAMINATION: ECOG PERFORMANCE STATUS: 1 - Symptomatic but completely ambulatory  VS reviewed in EPIC GENERAL:alert, in no acute distress and comfortable SKIN: no acute rashes, no significant lesions EYES: conjunctiva are pink and non-injected, sclera anicteric OROPHARYNX: MMM, no exudates, no oropharyngeal erythema or ulceration NECK: supple, no JVD LYMPH:  no palpable lymphadenopathy in the cervical, axillary or inguinal regions LUNGS: clear to auscultation b/l with normal respiratory effort HEART: regular  rate & rhythm ABDOMEN:  normoactive bowel sounds , non tender, not  distended. No palpable hepatosplenomegaly.  Extremity: no pedal edema PSYCH: alert & oriented x 3 with fluent speech NEURO: no focal motor/sensory deficits   LABORATORY DATA:  I have reviewed the data as listed  . CBC Latest Ref Rng & Units 10/01/2017 09/25/2017 09/17/2017  WBC 3.9 - 10.3 K/uL 2.3(L) 3.3(L) 5.1  Hemoglobin 11.6 - 15.9 g/dL 10.4(L) 9.7(L) 9.7(L)  Hematocrit 34.8 - 46.6 % 31.0(L) 28.7(L) 29.7(L)  Platelets 145 - 400 K/uL 105(L) 126(L) 133(L)    . CMP Latest Ref Rng & Units 10/01/2017 09/25/2017 09/17/2017  Glucose 70 - 99 mg/dL 124(H) 110(H) 105(H)  BUN 8 - 23 mg/dL '16 10 17  '$ Creatinine 0.44 - 1.00 mg/dL 0.68 0.70 0.76  Sodium 135 - 145 mmol/L 140 142 141  Potassium 3.5 - 5.1 mmol/L 4.3 3.9 4.6  Chloride 98 - 111 mmol/L 107 109 107  CO2 22 - 32 mmol/L '26 25 28  '$ Calcium 8.9 - 10.3 mg/dL 9.7 9.5 9.2  Total Protein 6.5 - 8.1 g/dL 6.3(L) 6.5 5.9(L)  Total Bilirubin 0.3 - 1.2 mg/dL 0.3 0.4 0.3  Alkaline Phos 38 - 126 U/L 60 65 57  AST 15 - 41 U/L 15 16 14(L)  ALT 0 - 44 U/L '19 15 12   '$ 05/19/17 Fine Needle Aspiration:  05/19/17 Bronchial Washing Specimen B:   RADIOGRAPHIC STUDIES: I have personally reviewed the radiological images as listed and agreed with the findings in the report. No results found. MRI brain 04/21/2017: IMPRESSION: 1. Motion degraded examination. 2. 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 3. Otherwise negative MRI of the head with and without contrast for age.  Electronically Signed   By: Elon Alas M.D.   On: 04/22/2017 00:34    ASSESSMENT & PLAN:   62 y.o. female with  1. Recently diagnosed Non-Small Cell Carcinoma on bronchoscopy/cytology. Not enough tissue for further characterization or mutation testing. Presenting with mediastinal nodal mass - TxN2 Mx (Atleast Stage III disease) ? Lingular primary vs inflammation. No overt evidence of metastatic disease.  -EGFR  mutation studies on blood negative. Patient is s/p 3 cycles of Carboplatin/taxol -resolution left upper lobe ground-glass opacity has resolved in the interval. 08/19/17 PET revealed  No significant change in size of hypermetabolic AP window mass. The degree of FDG uptake is slightly increased in the interval. 2. Persistent hypermetabolic focus involving the distal aspect of the tongue within SUV max of 16.46. Similar to previous exam. (no clinical co-relate). Lung involvement has decreased.   08/19/17 Brain MRI revealed No evidence of intracranial metastases. Interval resolution of bilateral parietal signal abnormality favoring PRES    2. Bone Pain from neulasta - managed with Tylenol  3. Recent SZ -currently on Keppra. MRI brain - resolution of findings of PRES  4. rt calf DVT - off anticoagulation due to massive GI bleeding. S/p IVC filter.  5. S/p Recent GI bleeding from Gastric ulcer --needing clipping and arterial embolization. hgb normal at 10.4  today. On PPI BID  6. Low Magnesium -On oral magnesium '200mg'$  BID  7. H/o ctx /steroid related thrush --now resolved.   PLAN:  -Discussed pt labwork today, 10/01/17; HGB slightly improved to 10.4, PLT at 105k, ANC normal at 1.5k, Magnesium low at 1.4 -Will continue to watch [place]  -Pt received additional magnesium with infusion today -Continue sucralfate -The pt has no prohibitive toxicities from continuing Carboplatin/Taxol with concurrent RT at this  time.   -Will transfuse if HGB <8 -Will continue weekly chemotherapy until August 13 when radiation concludes, unless prohibitive toxicities or neutropenia develop -Discussed that immunotherapy/Durvalumab may play a role after radiation -Patient's lung capacity revealed moderate diffusion problems in addition to moderate obstruction before treatment  -Will repeat PET 8-12 weeks after treatment completion   Please schedule last dose of chemotherapy in 1 week with labs RTC with Dr Irene Limbo in 2  weeks with labs   All of the patients questions were answered with apparent satisfaction. The patient knows to call the clinic with any problems, questions or concerns.  The total time spent in the appt was 20 minutes and more than 50% was on counseling and direct patient cares.    Sullivan Lone MD Kayla AAHIVMS [facility] Hematology/Oncology Physician [facility]  (Office):       [phone] (Work cell):  [phone] (Fax):           [phone]  10/01/2017 1:43 PM  I, Baldwin [place], am acting as a Education administrator for Dr Irene Limbo.   .I have reviewed the above documentation for accuracy and completeness, and I agree with the above. Brunetta Genera MD

## 2017-10-01 ENCOUNTER — Inpatient Hospital Stay: Payer: BLUE CROSS/BLUE SHIELD

## 2017-10-01 ENCOUNTER — Inpatient Hospital Stay (HOSPITAL_BASED_OUTPATIENT_CLINIC_OR_DEPARTMENT_OTHER): Payer: BLUE CROSS/BLUE SHIELD | Admitting: Hematology

## 2017-10-01 ENCOUNTER — Telehealth: Payer: Self-pay

## 2017-10-01 ENCOUNTER — Ambulatory Visit
Admission: RE | Admit: 2017-10-01 | Discharge: 2017-10-01 | Disposition: A | Discharge status: Home / Self Care | Payer: BLUE CROSS/BLUE SHIELD | Source: Ambulatory Visit | Attending: Radiation Oncology | Admitting: Radiation Oncology

## 2017-10-01 ENCOUNTER — Other Ambulatory Visit: Payer: Self-pay

## 2017-10-01 VITALS — BP 148/68 | HR 85 | Temp 98.0°F | Resp 18

## 2017-10-01 DIAGNOSIS — Z7189 Other specified counseling: Secondary | ICD-10-CM

## 2017-10-01 DIAGNOSIS — M898X9 Other specified disorders of bone, unspecified site: Secondary | ICD-10-CM

## 2017-10-01 DIAGNOSIS — C3490 Malignant neoplasm of unspecified part of unspecified bronchus or lung: Secondary | ICD-10-CM | POA: Diagnosis not present

## 2017-10-01 DIAGNOSIS — C383 Malignant neoplasm of mediastinum, part unspecified: Secondary | ICD-10-CM

## 2017-10-01 DIAGNOSIS — Z95828 Presence of other vascular implants and grafts: Secondary | ICD-10-CM

## 2017-10-01 DIAGNOSIS — C3491 Malignant neoplasm of unspecified part of right bronchus or lung: Secondary | ICD-10-CM

## 2017-10-01 DIAGNOSIS — Z87891 Personal history of nicotine dependence: Secondary | ICD-10-CM

## 2017-10-01 DIAGNOSIS — I82621 Acute embolism and thrombosis of deep veins of right upper extremity: Secondary | ICD-10-CM | POA: Diagnosis not present

## 2017-10-01 DIAGNOSIS — K254 Chronic or unspecified gastric ulcer with hemorrhage: Secondary | ICD-10-CM | POA: Diagnosis not present

## 2017-10-01 LAB — CBC WITH DIFFERENTIAL/PLATELET
Basophils Absolute: 0 10*3/uL (ref 0.0–0.1)
Basophils Relative: 1 %
Eosinophils Absolute: 0 10*3/uL (ref 0.0–0.5)
Eosinophils Relative: 2 %
HCT: 31 % — ABNORMAL LOW (ref 34.8–46.6)
Hemoglobin: 10.4 g/dL — ABNORMAL LOW (ref 11.6–15.9)
Lymphocytes Relative: 25 %
Lymphs Abs: 0.6 10*3/uL — ABNORMAL LOW (ref 0.9–3.3)
MCH: 34.2 pg — ABNORMAL HIGH (ref 25.1–34.0)
MCHC: 33.5 g/dL (ref 31.5–36.0)
MCV: 101.9 fL — ABNORMAL HIGH (ref 79.5–101.0)
Monocytes Absolute: 0.2 10*3/uL (ref 0.1–0.9)
Monocytes Relative: 7 %
Neutro Abs: 1.5 10*3/uL (ref 1.5–6.5)
Neutrophils Relative %: 65 %
Platelets: 105 10*3/uL — ABNORMAL LOW (ref 145–400)
RBC: 3.04 MIL/uL — ABNORMAL LOW (ref 3.70–5.45)
RDW: 17.3 % — ABNORMAL HIGH (ref 11.2–14.5)
WBC: 2.3 10*3/uL — ABNORMAL LOW (ref 3.9–10.3)

## 2017-10-01 LAB — CMP (CANCER CENTER ONLY)
ALT: 19 U/L (ref 0–44)
AST: 15 U/L (ref 15–41)
Albumin: 3.6 g/dL (ref 3.5–5.0)
Alkaline Phosphatase: 60 U/L (ref 38–126)
Anion gap: 7 (ref 5–15)
BUN: 16 mg/dL (ref 8–23)
CO2: 26 mmol/L (ref 22–32)
Calcium: 9.7 mg/dL (ref 8.9–10.3)
Chloride: 107 mmol/L (ref 98–111)
Creatinine: 0.68 mg/dL (ref 0.44–1.00)
GFR, Est AFR Am: >60 mL/min (ref >60)
GFR, Estimated: >60 mL/min (ref >60)
Glucose, Bld: 124 mg/dL — ABNORMAL HIGH (ref 70–99)
Potassium: 4.3 mmol/L (ref 3.5–5.1)
Sodium: 140 mmol/L (ref 135–145)
Total Bilirubin: 0.3 mg/dL (ref 0.3–1.2)
Total Protein: 6.3 g/dL — ABNORMAL LOW (ref 6.5–8.1)

## 2017-10-01 LAB — PHOSPHORUS: Phosphorus: 3 mg/dL (ref 2.5–4.6)

## 2017-10-01 LAB — MAGNESIUM: Magnesium: 1.4 mg/dL — CL (ref 1.7–2.4)

## 2017-10-01 MED ORDER — PALONOSETRON HCL INJECTION 0.25 MG/5ML
INTRAVENOUS | Status: AC
  Filled 2017-10-01: qty 5

## 2017-10-01 MED ORDER — SODIUM CHLORIDE 0.9% FLUSH
10.0000 mL | INTRAVENOUS | Status: DC | PRN
  Filled 2017-10-01: qty 10

## 2017-10-01 MED ORDER — SODIUM CHLORIDE 0.9 % IV SOLN
45.0000 mg/m2 | Freq: Once | INTRAVENOUS | Status: AC
  Administered 2017-10-01: 72 mg via INTRAVENOUS
  Filled 2017-10-01: qty 12

## 2017-10-01 MED ORDER — DIPHENHYDRAMINE HCL 50 MG/ML IJ SOLN
INTRAMUSCULAR | Status: AC
  Filled 2017-10-01: qty 1

## 2017-10-01 MED ORDER — SODIUM CHLORIDE 0.9 % IV SOLN
180.0000 mg | Freq: Once | INTRAVENOUS | Status: AC
  Administered 2017-10-01: 180 mg via INTRAVENOUS
  Filled 2017-10-01: qty 18

## 2017-10-01 MED ORDER — DIPHENHYDRAMINE HCL 50 MG/ML IJ SOLN
50.0000 mg | Freq: Once | INTRAMUSCULAR | Status: AC
  Administered 2017-10-01: 50 mg via INTRAVENOUS

## 2017-10-01 MED ORDER — SODIUM CHLORIDE 0.9% FLUSH
10.0000 mL | INTRAVENOUS | Status: DC | PRN
  Administered 2017-10-01: 10 mL
  Filled 2017-10-01: qty 10

## 2017-10-01 MED ORDER — MAGNESIUM SULFATE 2 GM/50ML IV SOLN
2.0000 g | Freq: Once | INTRAVENOUS | Status: AC
  Administered 2017-10-01: 2 g via INTRAVENOUS
  Filled 2017-10-01: qty 50

## 2017-10-01 MED ORDER — FAMOTIDINE IN NACL 20-0.9 MG/50ML-% IV SOLN
20.0000 mg | Freq: Once | INTRAVENOUS | Status: AC
  Administered 2017-10-01: 20 mg via INTRAVENOUS

## 2017-10-01 MED ORDER — HEPARIN SOD (PORK) LOCK FLUSH 100 UNIT/ML IV SOLN
500.0000 [IU] | Freq: Once | INTRAVENOUS | Status: AC | PRN
  Administered 2017-10-01: 500 [IU]
  Filled 2017-10-01: qty 5

## 2017-10-01 MED ORDER — FAMOTIDINE IN NACL 20-0.9 MG/50ML-% IV SOLN
INTRAVENOUS | Status: AC
  Filled 2017-10-01: qty 50

## 2017-10-01 MED ORDER — SODIUM CHLORIDE 0.9 % IV SOLN
Freq: Once | INTRAVENOUS | Status: AC
  Administered 2017-10-01: 12:00:00 via INTRAVENOUS
  Filled 2017-10-01: qty 250

## 2017-10-01 MED ORDER — PALONOSETRON HCL INJECTION 0.25 MG/5ML
0.2500 mg | Freq: Once | INTRAVENOUS | Status: AC
  Administered 2017-10-01: 0.25 mg via INTRAVENOUS

## 2017-10-01 MED ORDER — DEXAMETHASONE SODIUM PHOSPHATE 100 MG/10ML IJ SOLN
20.0000 mg | Freq: Once | INTRAMUSCULAR | Status: AC
  Administered 2017-10-01: 20 mg via INTRAVENOUS
  Filled 2017-10-01: qty 2

## 2017-10-01 NOTE — Patient Instructions (Addendum)
Implanted Port Home Guide An implanted port is a type of central line that is placed under the skin. Central lines are used to provide IV access when treatment or nutrition needs to be given through a person's veins. Implanted ports are used for long-term IV access. An implanted port may be placed because:  You need IV medicine that would be irritating to the small veins in your hands or arms.  You need long-term IV medicines, such as antibiotics.  You need IV nutrition for a long period.  You need frequent blood draws for lab tests.  You need dialysis.  Implanted ports are usually placed in the chest area, but they can also be placed in the upper arm, the abdomen, or the leg. An implanted port has two main parts:  Reservoir. The reservoir is round and will appear as a small, raised area under your skin. The reservoir is the part where a needle is inserted to give medicines or draw blood.  Catheter. The catheter is a thin, flexible tube that extends from the reservoir. The catheter is placed into a large vein. Medicine that is inserted into the reservoir goes into the catheter and then into the vein.  How will I care for my incision site? Do not get the incision site wet. Bathe or shower as directed by your health care provider. How is my port accessed? Special steps must be taken to access the port:  Before the port is accessed, a numbing cream can be placed on the skin. This helps numb the skin over the port site.  Your health care provider uses a sterile technique to access the port. ? Your health care provider must put on a mask and sterile gloves. ? The skin over your port is cleaned carefully with an antiseptic and allowed to dry. ? The port is gently pinched between sterile gloves, and a needle is inserted into the port.  Only "non-coring" port needles should be used to access the port. Once the port is accessed, a blood return should be checked. This helps ensure that the port  is in the vein and is not clogged.  If your port needs to remain accessed for a constant infusion, a clear (transparent) bandage will be placed over the needle site. The bandage and needle will need to be changed every week, or as directed by your health care provider.  Keep the bandage covering the needle clean and dry. Do not get it wet. Follow your health care provider's instructions on how to take a shower or bath while the port is accessed.  If your port does not need to stay accessed, no bandage is needed over the port.  What is flushing? Flushing helps keep the port from getting clogged. Follow your health care provider's instructions on how and when to flush the port. Ports are usually flushed with saline solution or a medicine called heparin. The need for flushing will depend on how the port is used.  If the port is used for intermittent medicines or blood draws, the port will need to be flushed: ? After medicines have been given. ? After blood has been drawn. ? As part of routine maintenance.  If a constant infusion is running, the port may not need to be flushed.  How long will my port stay implanted? The port can stay in for as long as your health care provider thinks it is needed. When it is time for the port to come out, surgery will be   done to remove it. The procedure is similar to the one performed when the port was put in. When should I seek immediate medical care? When you have an implanted port, you should seek immediate medical care if:  You notice a bad smell coming from the incision site.  You have swelling, redness, or drainage at the incision site.  You have more swelling or pain at the port site or the surrounding area.  You have a fever that is not controlled with medicine.  This information is not intended to replace advice given to you by your health care provider. Make sure you discuss any questions you have with your health care provider. Document  Released: 02/24/2005 Document Revised: 08/02/2015 Document Reviewed: 11/01/2012 Elsevier Interactive Patient Education  2017 [employer]  

## 2017-10-01 NOTE — Patient Instructions (Signed)
[facility] Discharge Instructions for Patients Receiving Chemotherapy  Today you received the following chemotherapy agents Taxol, carboplatin To help prevent nausea and vomiting after your treatment, we encourage you to take your nausea medication as directed If you develop nausea and vomiting that is not controlled by your nausea medication, call the clinic.   BELOW ARE SYMPTOMS THAT SHOULD BE REPORTED IMMEDIATELY:  *FEVER GREATER THAN 100.5 F  *CHILLS WITH OR WITHOUT FEVER  NAUSEA AND VOMITING THAT IS NOT CONTROLLED WITH YOUR NAUSEA MEDICATION  *UNUSUAL SHORTNESS OF BREATH  *UNUSUAL BRUISING OR BLEEDING  TENDERNESS IN MOUTH AND THROAT WITH OR WITHOUT PRESENCE OF ULCERS  *URINARY PROBLEMS  *BOWEL PROBLEMS  UNUSUAL RASH Items with * indicate a potential emergency and should be followed up as soon as possible.  Feel free to call the clinic should you have any questions or concerns. The clinic phone number is [phone].  Please show the [facility] at check-in to the Emergency Department and triage nurse.

## 2017-10-01 NOTE — Telephone Encounter (Signed)
Dr. Irene Limbo made aware of Magnesium level of 1.4. Orders for Magnesium 2g IV. Eliezer Lofts in pharmacy made aware. Also, per Dr. Irene Limbo it is ok to release orders for treatment today and he will see patient in the infusion room. Monica, RN in infusion made aware.

## 2017-10-02 ENCOUNTER — Ambulatory Visit
Admission: RE | Admit: 2017-10-02 | Discharge: 2017-10-02 | Disposition: A | Discharge status: Home / Self Care | Payer: BLUE CROSS/BLUE SHIELD | Source: Ambulatory Visit | Attending: Radiation Oncology | Admitting: Radiation Oncology

## 2017-10-02 ENCOUNTER — Telehealth: Payer: Self-pay | Admitting: Hematology

## 2017-10-02 DIAGNOSIS — C383 Malignant neoplasm of mediastinum, part unspecified: Secondary | ICD-10-CM | POA: Diagnosis not present

## 2017-10-02 NOTE — Telephone Encounter (Signed)
Spoke to patient regarding upcoming aug appts.

## 2017-10-05 ENCOUNTER — Ambulatory Visit
Admission: RE | Admit: 2017-10-05 | Discharge: 2017-10-05 | Disposition: A | Discharge status: Home / Self Care | Payer: BLUE CROSS/BLUE SHIELD | Source: Ambulatory Visit | Attending: Radiation Oncology | Admitting: Radiation Oncology

## 2017-10-05 DIAGNOSIS — C383 Malignant neoplasm of mediastinum, part unspecified: Secondary | ICD-10-CM | POA: Diagnosis not present

## 2017-10-06 ENCOUNTER — Ambulatory Visit
Admission: RE | Admit: 2017-10-06 | Discharge: 2017-10-06 | Disposition: A | Discharge status: Home / Self Care | Payer: BLUE CROSS/BLUE SHIELD | Source: Ambulatory Visit | Attending: Radiation Oncology | Admitting: Radiation Oncology

## 2017-10-06 DIAGNOSIS — C383 Malignant neoplasm of mediastinum, part unspecified: Secondary | ICD-10-CM | POA: Diagnosis not present

## 2017-10-07 ENCOUNTER — Ambulatory Visit
Admission: RE | Admit: 2017-10-07 | Discharge: 2017-10-07 | Disposition: A | Discharge status: Home / Self Care | Payer: BLUE CROSS/BLUE SHIELD | Source: Ambulatory Visit | Attending: Radiation Oncology | Admitting: Radiation Oncology

## 2017-10-07 DIAGNOSIS — C383 Malignant neoplasm of mediastinum, part unspecified: Secondary | ICD-10-CM | POA: Diagnosis not present

## 2017-10-08 ENCOUNTER — Other Ambulatory Visit: Payer: Self-pay

## 2017-10-08 ENCOUNTER — Inpatient Hospital Stay: Payer: BLUE CROSS/BLUE SHIELD | Attending: Hematology

## 2017-10-08 ENCOUNTER — Inpatient Hospital Stay: Payer: BLUE CROSS/BLUE SHIELD

## 2017-10-08 ENCOUNTER — Ambulatory Visit
Admission: RE | Admit: 2017-10-08 | Discharge: 2017-10-08 | Disposition: A | Discharge status: Home / Self Care | Payer: BLUE CROSS/BLUE SHIELD | Source: Ambulatory Visit | Attending: Radiation Oncology | Admitting: Radiation Oncology

## 2017-10-08 ENCOUNTER — Telehealth: Payer: Self-pay

## 2017-10-08 VITALS — BP 130/64 | HR 72 | Temp 97.9°F | Resp 18

## 2017-10-08 DIAGNOSIS — Z510 Encounter for antineoplastic radiation therapy: Secondary | ICD-10-CM | POA: Diagnosis not present

## 2017-10-08 DIAGNOSIS — I824Z1 Acute embolism and thrombosis of unspecified deep veins of right distal lower extremity: Secondary | ICD-10-CM | POA: Diagnosis not present

## 2017-10-08 DIAGNOSIS — M898X9 Other specified disorders of bone, unspecified site: Secondary | ICD-10-CM | POA: Insufficient documentation

## 2017-10-08 DIAGNOSIS — C383 Malignant neoplasm of mediastinum, part unspecified: Secondary | ICD-10-CM | POA: Diagnosis not present

## 2017-10-08 DIAGNOSIS — Z95828 Presence of other vascular implants and grafts: Secondary | ICD-10-CM

## 2017-10-08 DIAGNOSIS — Z7189 Other specified counseling: Secondary | ICD-10-CM

## 2017-10-08 DIAGNOSIS — Z87891 Personal history of nicotine dependence: Secondary | ICD-10-CM | POA: Insufficient documentation

## 2017-10-08 DIAGNOSIS — C3490 Malignant neoplasm of unspecified part of unspecified bronchus or lung: Secondary | ICD-10-CM | POA: Diagnosis not present

## 2017-10-08 LAB — CBC WITH DIFFERENTIAL/PLATELET
Basophils Absolute: 0 10*3/uL (ref 0.0–0.1)
Basophils Relative: 0 %
Eosinophils Absolute: 0 10*3/uL (ref 0.0–0.5)
Eosinophils Relative: 1 %
HCT: 28.6 % — ABNORMAL LOW (ref 34.8–46.6)
Hemoglobin: 9.6 g/dL — ABNORMAL LOW (ref 11.6–15.9)
Lymphocytes Relative: 35 %
Lymphs Abs: 0.6 10*3/uL — ABNORMAL LOW (ref 0.9–3.3)
MCH: 34 pg (ref 25.1–34.0)
MCHC: 33.6 g/dL (ref 31.5–36.0)
MCV: 101.4 fL — ABNORMAL HIGH (ref 79.5–101.0)
Monocytes Absolute: 0.2 10*3/uL (ref 0.1–0.9)
Monocytes Relative: 11 %
Neutro Abs: 0.9 10*3/uL — ABNORMAL LOW (ref 1.5–6.5)
Neutrophils Relative %: 53 %
Platelets: 63 10*3/uL — ABNORMAL LOW (ref 145–400)
RBC: 2.82 MIL/uL — ABNORMAL LOW (ref 3.70–5.45)
RDW: 15.7 % — ABNORMAL HIGH (ref 11.2–14.5)
WBC: 1.7 10*3/uL — ABNORMAL LOW (ref 3.9–10.3)

## 2017-10-08 LAB — CMP (CANCER CENTER ONLY)
ALT: 18 U/L (ref 0–44)
AST: 17 U/L (ref 15–41)
Albumin: 3.5 g/dL (ref 3.5–5.0)
Alkaline Phosphatase: 69 U/L (ref 38–126)
Anion gap: 8 (ref 5–15)
BUN: 11 mg/dL (ref 8–23)
CO2: 25 mmol/L (ref 22–32)
Calcium: 9.5 mg/dL (ref 8.9–10.3)
Chloride: 108 mmol/L (ref 98–111)
Creatinine: 0.64 mg/dL (ref 0.44–1.00)
GFR, Est AFR Am: >60 mL/min (ref >60)
GFR, Estimated: >60 mL/min (ref >60)
Glucose, Bld: 87 mg/dL (ref 70–99)
Potassium: 4.1 mmol/L (ref 3.5–5.1)
Sodium: 141 mmol/L (ref 135–145)
Total Bilirubin: 0.3 mg/dL (ref 0.3–1.2)
Total Protein: 6.6 g/dL (ref 6.5–8.1)

## 2017-10-08 LAB — MAGNESIUM: Magnesium: 1.5 mg/dL — ABNORMAL LOW (ref 1.7–2.4)

## 2017-10-08 MED ORDER — SODIUM CHLORIDE 0.9 % IV SOLN
1.0000 g | Freq: Once | INTRAVENOUS | Status: DC
  Filled 2017-10-08: qty 2

## 2017-10-08 MED ORDER — SODIUM CHLORIDE 0.9 % IV SOLN
1.0000 g | Freq: Once | INTRAVENOUS | Status: AC
  Administered 2017-10-08: 1 g via INTRAVENOUS
  Filled 2017-10-08: qty 2

## 2017-10-08 MED ORDER — SODIUM CHLORIDE 0.9 % IV SOLN
Freq: Once | INTRAVENOUS | Status: AC
  Administered 2017-10-08: 11:00:00 via INTRAVENOUS
  Filled 2017-10-08: qty 250

## 2017-10-08 MED ORDER — HEPARIN SOD (PORK) LOCK FLUSH 100 UNIT/ML IV SOLN
500.0000 [IU] | Freq: Once | INTRAVENOUS | Status: AC | PRN
  Administered 2017-10-08: 500 [IU]
  Filled 2017-10-08: qty 5

## 2017-10-08 MED ORDER — SODIUM CHLORIDE 0.9 % IV SOLN
INTRAVENOUS | Status: AC
  Administered 2017-10-08: 13:00:00 via INTRAVENOUS
  Filled 2017-10-08: qty 250

## 2017-10-08 MED ORDER — SODIUM CHLORIDE 0.9% FLUSH
10.0000 mL | INTRAVENOUS | Status: DC | PRN
  Administered 2017-10-08 (×2): 10 mL
  Filled 2017-10-08: qty 10

## 2017-10-08 NOTE — Patient Instructions (Signed)
Implanted Port Home Guide An implanted port is a type of central line that is placed under the skin. Central lines are used to provide IV access when treatment or nutrition needs to be given through a person's veins. Implanted ports are used for long-term IV access. An implanted port may be placed because:  You need IV medicine that would be irritating to the small veins in your hands or arms.  You need long-term IV medicines, such as antibiotics.  You need IV nutrition for a long period.  You need frequent blood draws for lab tests.  You need dialysis.  Implanted ports are usually placed in the chest area, but they can also be placed in the upper arm, the abdomen, or the leg. An implanted port has two main parts:  Reservoir. The reservoir is round and will appear as a small, raised area under your skin. The reservoir is the part where a needle is inserted to give medicines or draw blood.  Catheter. The catheter is a thin, flexible tube that extends from the reservoir. The catheter is placed into a large vein. Medicine that is inserted into the reservoir goes into the catheter and then into the vein.  How will I care for my incision site? Do not get the incision site wet. Bathe or shower as directed by your health care provider. How is my port accessed? Special steps must be taken to access the port:  Before the port is accessed, a numbing cream can be placed on the skin. This helps numb the skin over the port site.  Your health care provider uses a sterile technique to access the port. ? Your health care provider must put on a mask and sterile gloves. ? The skin over your port is cleaned carefully with an antiseptic and allowed to dry. ? The port is gently pinched between sterile gloves, and a needle is inserted into the port.  Only "non-coring" port needles should be used to access the port. Once the port is accessed, a blood return should be checked. This helps ensure that the port  is in the vein and is not clogged.  If your port needs to remain accessed for a constant infusion, a clear (transparent) bandage will be placed over the needle site. The bandage and needle will need to be changed every week, or as directed by your health care provider.  Keep the bandage covering the needle clean and dry. Do not get it wet. Follow your health care provider's instructions on how to take a shower or bath while the port is accessed.  If your port does not need to stay accessed, no bandage is needed over the port.  What is flushing? Flushing helps keep the port from getting clogged. Follow your health care provider's instructions on how and when to flush the port. Ports are usually flushed with saline solution or a medicine called heparin. The need for flushing will depend on how the port is used.  If the port is used for intermittent medicines or blood draws, the port will need to be flushed: ? After medicines have been given. ? After blood has been drawn. ? As part of routine maintenance.  If a constant infusion is running, the port may not need to be flushed.  How long will my port stay implanted? The port can stay in for as long as your health care provider thinks it is needed. When it is time for the port to come out, surgery will be   done to remove it. The procedure is similar to the one performed when the port was put in. When should I seek immediate medical care? When you have an implanted port, you should seek immediate medical care if:  You notice a bad smell coming from the incision site.  You have swelling, redness, or drainage at the incision site.  You have more swelling or pain at the port site or the surrounding area.  You have a fever that is not controlled with medicine.  This information is not intended to replace advice given to you by your health care provider. Make sure you discuss any questions you have with your health care provider. Document  Released: 02/24/2005 Document Revised: 08/02/2015 Document Reviewed: 11/01/2012 Elsevier Interactive Patient Education  2017 [employer] Inc.  

## 2017-10-08 NOTE — Telephone Encounter (Signed)
Per Dr. Irene Limbo, no chemo treatment today due to platelet count of 63 and ANC 0.9. Dr. Irene Limbo did order 1gram of magnesium IV. Tim, RN in infusion made aware and pharmacy made aware.

## 2017-10-08 NOTE — Progress Notes (Signed)
mage

## 2017-10-08 NOTE — Patient Instructions (Signed)
Neutropenic Fever Neutropenic fever is a type of fever that can develop in someone who has a very low number of a certain kind of white blood cells called neutrophils (neutropenia). These blood cells are important for fighting infections caused by bacteria and fungi. When you have neutropenia, you could be in danger of a severe infection. You may need to start taking antibiotic medicines. What are the causes? Neutrophils are made in the spongy tissue inside your bones (bone marrow). Anything that damages your bone marrow or damages neutrophils after they leave your bone marrow can cause neutropenia. Once you have a dangerously low level of neutrophils, you are at risk for infection and neutropenic fever. Causes of neutropenia may include:  Cancer treatments.  Bone marrow cancer.  Cancer of the white blood cells (leukemia or myeloma).  Severe infection.  Bone marrow failure (aplastic anemia).  Many types of medicines.  Diseases of the body's defense system (autoimmune diseases).  Inherited genes that cause neutropenia.  Vitamin B deficiency.  Spleen enlargement in rheumatoid arthritis (Felty syndrome).  What are the signs or symptoms? Fever is the main symptom of neutropenic fever. Other signs and symptoms may include:  Chills.  Fatigue.  Painful mouth ulcers.  Cough.  Shortness of breath.  Swollen glands (lymph nodes).  Sore throat.  Sinus and ear infections.  Gum disease.  Skin infection.  Burning and frequent urination.  Rectal infections.  Vaginal discharge or itching.  How is this diagnosed? Your health care provider may diagnose neutropenic fever if your neutrophil count is less than 500 neutrophils per microliter of blood and you have a fever of at least 100.30F (38.0C).  Blood tests and other tests that measure neutrophils will be done. These may include: ? A complete blood count (CBC) and a differential white blood count (WBC). ? Peripheral smear.  This test involves checking a blood sample under a microscope.  Other types of tests may also be done, including: ? Chest X-rays. ? Cultures of blood and body fluids to look for a source of infection.  Your health care provider will also determine if your neutropenic fever is high risk or low risk.  You may have high-risk neutropenic fever if: ? Your neutrophil count is less than 100 neutrophils per microliter of blood. ? You have also been diagnosed with pneumonia or another serious medical problem. ? Your condition requires you to be treated in the hospital.  You may have low-risk neutropenic fever if: ? Your neutrophil count is more than 100 neutrophils per microliter of blood. ? Your chest X-ray is normal. ? You do not have an active illness or any other problems that require you to be in the hospital.  How is this treated? You may start treatment as soon as you get diagnosed with neutropenic fever, even if your health care provider is still looking for the source of infection.  Treatment for high-risk neutropenic fever is antibiotic medicine given through an IV access tube. This is done in the hospital. You may be given a single antibiotic or a combination of antibiotics.  Low-risk neutropenic fever may be treated at home. You may have to take one or two different oral antibiotics. In some cases, you may need to be treated with IV antibiotics that are given by a home health care provider who visits your home.  If your health care provider finds a specific cause of infection, you may be switched to the antibiotics that work best against those particular bacteria.  If  a fungal infection is found, your medicine will be changed to an antifungal medicine.  If the fever goes away in 3-5 days, you may have to take medicine for about 7 days. If the fever is not responding, you may have to take medicine longer.  You may have to stop taking any medicine that could be causing neutropenic  fever.  If you have neutropenic fever from cancer treatment drugs (chemotherapy), you may need to take a type of medicine called white blood cell growth factors. This medicine can help prevent fever.  Follow these instructions at home:  Only take medicines as directed by your health care provider. ? If you are being treated with oral antibiotics at home, you may need to return to your health care provider every day to have your CBC checked. You may have to do this until your fever responds. ? Take your antibiotics as directed. Make sure you finish them even if you start to feel better.  Preventing infection is important when you have neutropenia. Here are some ways to prevent infections: ? Avoid sick friends and family members. ? Wash your hands often. ? Do noteat uncooked or undercooked meats. ? Wash all fruits and vegetables. ? Do not eat or drink unpasteurized dairy products. ? Get regular dental care, and maintain good dental hygiene. ? Get a flu shot. Ask your health care provider whether you need any other vaccines. ? Wear gloves when gardening.  Follow up with your health care provider as directed. Contact a health care provider if:  You have chills.  You have a fever.  You have signs or symptoms of infection. Get help right away if:  You have trouble breathing.  You have chest pain. This information is not intended to replace advice given to you by your health care provider. Make sure you discuss any questions you have with your health care provider. Document Released: 03/01/2013 Document Revised: 08/02/2015 Document Reviewed: 08/29/2015 Elsevier Interactive Patient Education  2018 [employer].   Magnesium Sulfate injection What is this medicine? MAGNESIUM SULFATE (mag NEE zee um SUL fate) is an electrolyte injection commonly used to treat low magnesium levels in your blood. It is also used to prevent or control seizures in women with preeclampsia or eclampsia. This  medicine may be used for other purposes; ask your health care provider or pharmacist if you have questions. What should I tell my health care provider before I take this medicine? They need to know if you have any of these conditions: -heart disease -history of irregular heart beat -kidney disease -an unusual or allergic reaction to magnesium sulfate, medicines, foods, dyes, or preservatives -pregnant or trying to get pregnant -breast-feeding How should I use this medicine? This medicine is for infusion into a vein. It is given by a health care professional in a hospital or clinic setting. Talk to your pediatrician regarding the use of this medicine in children. While this drug may be prescribed for selected conditions, precautions do apply. Overdosage: If you think you have taken too much of this medicine contact a poison control center or emergency room at once. NOTE: This medicine is only for you. Do not share this medicine with others. What if I miss a dose? This does not apply. What may interact with this medicine? This medicine may interact with the following medications: -certain medicines for anxiety or sleep -certain medicines for seizures like phenobarbital -digoxin -medicines that relax muscles for surgery -narcotic medicines for pain This list may not  describe all possible interactions. Give your health care provider a list of all the medicines, herbs, non-prescription drugs, or dietary supplements you use. Also tell them if you smoke, drink alcohol, or use illegal drugs. Some items may interact with your medicine. What should I watch for while using this medicine? Your condition will be monitored carefully while you are receiving this medicine. You may need blood work done while you are receiving this medicine. What side effects may I notice from receiving this medicine? Side effects that you should report to your doctor or health care professional as soon as  possible: -allergic reactions like skin rash, itching or hives, swelling of the face, lips, or tongue -facial flushing -muscle weakness -signs and symptoms of low blood pressure like dizziness; feeling faint or lightheaded, falls; unusually weak or tired -signs and symptoms of a dangerous change in heartbeat or heart rhythm like chest pain; dizziness; fast or irregular heartbeat; palpitations; breathing problems -sweating This list may not describe all possible side effects. Call your doctor for medical advice about side effects. You may report side effects to FDA at 1-800-FDA-1088. Where should I keep my medicine? This drug is given in a hospital or clinic and will not be stored at home. NOTE: This sheet is a summary. It may not cover all possible information. If you have questions about this medicine, talk to your doctor, pharmacist, or health care provider.  2018 Elsevier/Gold Standard (2015-09-12 12:31:42)

## 2017-10-09 ENCOUNTER — Ambulatory Visit
Admission: RE | Admit: 2017-10-09 | Discharge: 2017-10-09 | Disposition: A | Discharge status: Home / Self Care | Payer: BLUE CROSS/BLUE SHIELD | Source: Ambulatory Visit | Attending: Radiation Oncology | Admitting: Radiation Oncology

## 2017-10-09 DIAGNOSIS — C383 Malignant neoplasm of mediastinum, part unspecified: Secondary | ICD-10-CM | POA: Diagnosis not present

## 2017-10-12 ENCOUNTER — Ambulatory Visit
Admission: RE | Admit: 2017-10-12 | Discharge: 2017-10-12 | Disposition: A | Discharge status: Home / Self Care | Payer: BLUE CROSS/BLUE SHIELD | Source: Ambulatory Visit | Attending: Radiation Oncology | Admitting: Radiation Oncology

## 2017-10-12 DIAGNOSIS — C383 Malignant neoplasm of mediastinum, part unspecified: Secondary | ICD-10-CM | POA: Diagnosis not present

## 2017-10-13 ENCOUNTER — Ambulatory Visit
Admission: RE | Admit: 2017-10-13 | Discharge: 2017-10-13 | Disposition: A | Discharge status: Home / Self Care | Payer: BLUE CROSS/BLUE SHIELD | Source: Ambulatory Visit | Attending: Radiation Oncology | Admitting: Radiation Oncology

## 2017-10-13 DIAGNOSIS — C383 Malignant neoplasm of mediastinum, part unspecified: Secondary | ICD-10-CM | POA: Diagnosis not present

## 2017-10-14 ENCOUNTER — Ambulatory Visit
Admission: RE | Admit: 2017-10-14 | Discharge: 2017-10-14 | Disposition: A | Discharge status: Home / Self Care | Payer: BLUE CROSS/BLUE SHIELD | Source: Ambulatory Visit | Attending: Radiation Oncology | Admitting: Radiation Oncology

## 2017-10-14 DIAGNOSIS — C383 Malignant neoplasm of mediastinum, part unspecified: Secondary | ICD-10-CM | POA: Diagnosis not present

## 2017-10-14 NOTE — Progress Notes (Signed)
HEMATOLOGY/ONCOLOGY CLINIC NOTE  Date of Service: 10/15/2017   Patient Care Team: [name], MD as PCP - General (Family Medicine)  CHIEF COMPLAINTS/PURPOSE OF CONSULTATION:  F/u for continued management of recently diagnosed Non-Small Cell lung Carcinoma  HISTORY OF PRESENTING ILLNESS:   Kayla Wyatt is a wonderful 62 y.o. female who has been referred to [place] by Dr [name] for evaluation and management of Non-Small Cell Carcinoma. She is accompanied today by her husband. The pt reports that she is doing well overall.   Patient recently had a significant hospitalization on 04/02/2017 - as per DC summary by [name] " with abdominal pain, hematemesis, melena and ABLA due to GIB.  She underwent clipping of gastric ulcer and received multiple units PRBC but continued to have drop in H/H with bloody stool. NM GI scan was negative for bleeding and she underwent colonoscopy by Dr. [name] 1/31 showing diverticulosis but no signs of bleeding. Hospital course significant for R- gastrocnemius DVT, PAF, incidental findings of mediastinal mass as well as brief episode of hematuria. She was cleared to start on Xarelto on 02/01 for treatment of DVT. On 2/2 am she developed hematemesis with maroon stools and hypotension requiring fluid bolus as well as 2 units PRBCs. She continued to decline requiring intubation, pressors as well as reversal of anticoagulation with Kcentra on 02/3. She underwent UGI revealing large clot with fresh blood in stomach and underwent mesenteric arteriogram with percutaneous coil embolization fo distal tributary of left gastric artery and placement of IVC filter by interventional radiology.   A fib with RVR felt to be due to hemorrhagic shock and she converted to NSR. [name] felt no further cardiac work up indicated and did not recommend initiating anticoagulation in short term. To follow up with [name] after PET scan and routine biopsy after medically  stable. She continued to have bleeding and underwent visceral angiogram with embolization of inferior division of splenic artery and associated gastric arteries on 2/4. No surgical intervention needed per [name] with recommendations to continue monitoring H/H as well as for recurrence of hematochezia. She tolerated extubation on 2/6 and respiratory status improving.  On 2/11, she has episode of unresponsiveness with jerking of bilateral limbs that lasted about 3 minutes. She had amnesia of events but was back to baseline. EEG revealed "focal slowing over the right temporo-occipital regionandoccasional epileptiform discharges over the right occipital region". Head CT reviewed, unremarkable for acute intracranial process. MRI brain done revealing mild biparietal signal abnormality suspicious for PRES. [name] recommended starting patient on Keppra BID with repeat MRI in 2 weeks. New onset seizure likely provoked by PRES--if resolved--Ok to take patient off Keppra. Patient with resultant generalized weakness. CIR recommended due to functional deficits. "   Later as outpatient she had a PET scan on 05/14/17 which showed a hypermetabolic mediastinal mass which was subsequently biopsied with a bronchoscopy on 05/19/17. She notes that she had smoked for about 35-40 years, half a pack of a day. She reports that she has not had any other symptoms related to her smoking; she has never had to be on home oxygen or inhalers. She has recently begun Symbicort. While being in the hospital she lost about 20 lbs but has maintained her weight since being discharged.  The pt and her husband note that she is concerned about what to do if she is not able to go back to work after her 90-day leave. We recommended that they speak with  our social workers here to explore the available options for insurance coverage /disability application etc.  Of note prior to the patient's visit today, pt has had PET/CT completed on  05/14/17 with results revealing 1. The known mediastinal mass is intensely hypermetabolic, worrisome for malignancy. This could reflect lymphoma or small cell lung cancer. Tissue sampling recommended.2. No peripheral lung mass identified. New patchy ground-glass opacity in the lingula is mildly hypermetabolic and likely inflammatory.  On 05/19/17 the pt had a Fine Needle Aspiration which revealed Non-Small cell lung carcinoma.  Most recent lab results (05/19/17) of CBC  is as follows: all values are WNL except for RBC at 2.90, Hgb at 8.6, HCT at 28.0, RDW at 16.4.  On review of systems, pt reports knee pain, and denies abdominal pains, leg swelling, SOB, CP, and any other symptoms.   On PMHx the pt reports acute blood loss anemia, GI hemorrhage with melena, hypercholesteremia, HTN, persistent atrial fibrillation with rapid ventricular response, Vitamin D deficiency, denies liver and kidney problems. On Social Hx the pt reports having smoked for 35-40 years of half a pack per day. She quit smoking on 03/31/17.  On Family Hx the pt reports that her father had heart disease.  Interval History:   Kayla Wyatt returns today regarding her Non-Small Cell Carcinoma. She is here for concurrent Carboplatin/taxol and radiation treatment. The patient's last visit with [place] was on 10/01/17. She is accompanied today by her husband. The pt reports that she is doing well overall. She will finish RT on 10/20/17.   The pt reports that she has not had any new concerns in the interim. She notes that she has been eating well, consuming soft foods and using Sucralfate for successful throat pain relief. She is consuming one Ensure each day and thinks she is eating more than half of what she used to eat.   The pt has continued to be compliant with Lovenox injections.   Lab results today (10/15/17) of CBC w/diff, CMP is as follows: all values are WNL except for WBC at 2.5k, RBC at 2.73, HGB at 9.5, HCT at 28.2, MCV at 103.3,  MCH at 34.8, RDW at 15.7, PLT at 74k, ANC at 1.4k, Lymphs abs at 700, Glucose at 107, Albumin at 3.3, AST at 13. Magnesium 10/15/17 is low at 1.5  On review of systems, pt reports controlled throat pain, some weight loss, eating moderately well, good energy levels, and denies blood in the stools, black stools, shortness of breath, chest pain, pain along the spine, back pain, leg swelling, and any other symptoms.    MEDICAL HISTORY:  Past Medical History:  Diagnosis Date  . Acute blood loss anemia 04/02/2017  . Anxiety   . Gastrointestinal hemorrhage with melena   . Headache   . Hypercholesteremia   . Hypertension   . Persistent atrial fibrillation with rapid ventricular response ([place]) 04/07/2017  . Seizures ([place])    04/2017 while in hospital at [facility]  . Vitamin D deficiency     SURGICAL HISTORY: Past Surgical History:  Procedure Laterality Date  . COLONOSCOPY Left 04/09/2017   Procedure: COLONOSCOPY;  Surgeon: Carol Ada, MD;  Location: [place];  Service: Endoscopy;  Laterality: Left;  . ESOPHAGOGASTRODUODENOSCOPY N/A 04/03/2017   Procedure: ESOPHAGOGASTRODUODENOSCOPY (EGD);  Surgeon: Carol Ada, MD;  Location: [place];  Service: Endoscopy;  Laterality: N/A;  . ESOPHAGOGASTRODUODENOSCOPY (EGD) WITH PROPOFOL N/A 04/12/2017   Procedure: ESOPHAGOGASTRODUODENOSCOPY (EGD) WITH PROPOFOL;  Surgeon: Milus Banister, MD;  Location: [facility]  ENDOSCOPY;  Service: Endoscopy;  Laterality: N/A;  . IR ANGIOGRAM SELECTIVE EACH ADDITIONAL VESSEL  04/12/2017  . IR ANGIOGRAM SELECTIVE EACH ADDITIONAL VESSEL  04/13/2017  . IR ANGIOGRAM SELECTIVE EACH ADDITIONAL VESSEL  04/13/2017  . IR ANGIOGRAM SELECTIVE EACH ADDITIONAL VESSEL  04/13/2017  . IR ANGIOGRAM VISCERAL SELECTIVE  04/12/2017  . IR ANGIOGRAM VISCERAL SELECTIVE  04/13/2017  . IR EMBO ART  VEN HEMORR LYMPH EXTRAV  INC GUIDE ROADMAPPING  04/12/2017  . IR EMBO ART  VEN HEMORR LYMPH EXTRAV  INC GUIDE ROADMAPPING  04/13/2017  . IR FLUORO GUIDE CV LINE RIGHT   04/13/2017  . IR FLUORO GUIDE PORT INSERTION RIGHT  07/27/2017  . IR IVC FILTER PLMT / S&I /IMG GUID/MOD SED  04/12/2017  . IR US GUIDE VASC ACCESS RIGHT  04/12/2017  . IR US GUIDE VASC ACCESS RIGHT  04/13/2017  . IR US GUIDE VASC ACCESS RIGHT  04/13/2017  . IR US GUIDE VASC ACCESS RIGHT  07/27/2017  . LEG SURGERY  1974   Blood Clot Removal   . VIDEO BRONCHOSCOPY WITH ENDOBRONCHIAL ULTRASOUND N/A 05/19/2017   Procedure: VIDEO BRONCHOSCOPY WITH ENDOBRONCHIAL ULTRASOUND;  Surgeon: Grace Isaac, MD;  Location: [place];  Service: Thoracic;  Laterality: N/A;    SOCIAL HISTORY: Social History   Socioeconomic History  . Marital status: Single    Spouse name: Not on file  . Number of children: Not on file  . Years of education: Not on file  . Highest education level: Not on file  Occupational History  . Occupation: Has to lift heavy boxes at times.    Employer: [employer]  Social Needs  . Financial resource strain: Not on file  . Food insecurity:    Worry: Not on file    Inability: Not on file  . Transportation needs:    Medical: Not on file    Non-medical: Not on file  Tobacco Use  . Smoking status: Former Smoker    Packs/day: 0.50    Years: 40.00    Pack years: 20.00    Types: Cigarettes    Last attempt to quit: 03/31/2017    Years since quitting: 0.5  . Smokeless tobacco: Never Used  Substance and Sexual Activity  . Alcohol use: No    Frequency: Never  . Drug use: No  . Sexual activity: Not Currently  Lifestyle  . Physical activity:    Days per week: Not on file    Minutes per session: Not on file  . Stress: Not on file  Relationships  . Social connections:    Talks on phone: Not on file    Gets together: Not on file    Attends religious service: Not on file    Active member of club or organization: Not on file    Attends meetings of clubs or organizations: Not on file    Relationship status: Not on file  . Intimate partner violence:    Fear of current or ex partner: Not  on file    Emotionally abused: Not on file    Physically abused: Not on file    Forced sexual activity: Not on file  Other Topics Concern  . Not on file  Social History Narrative  . Not on file    FAMILY HISTORY: Family History  Problem Relation Age of Onset  . Heart disease Father     ALLERGIES:  has No Known Allergies.  MEDICATIONS:  Current Outpatient Medications  Medication Sig Dispense Refill  .  acetaminophen (TYLENOL) 325 MG tablet Take 1-2 tablets (325-650 mg total) by mouth every 4 (four) hours as needed for mild pain.    . budesonide-formoterol (SYMBICORT) 80-4.5 MCG/ACT inhaler Inhale 2 puffs into the lungs 2 (two) times daily. 1 Inhaler 12  . cholecalciferol (VITAMIN D) 1000 units tablet Take 1,000 Units by mouth 2 (two) times daily.    [name] dexamethasone (DECADRON) 4 MG tablet Take 2 tablets (8 mg total) by mouth daily. Start the day after chemotherapy for 2 days. 30 tablet 1  . levETIRAcetam (KEPPRA) 500 MG tablet TK 1 T PO BID  6  . lidocaine-prilocaine (EMLA) cream Apply to affected area once 30 g 3  . LORazepam (ATIVAN) 0.5 MG tablet Take 2 tablets (1 mg total) by mouth once as needed for up to 1 dose (prior to PET/CT and MRI brain for anxiety). 6 tablet 0  . magnesium chloride (SLOW-MAG) 64 MG TBEC SR tablet Take 2 tablets (128 mg total) by mouth daily. 60 tablet 1  . ondansetron (ZOFRAN) 8 MG tablet Take 1 tablet (8 mg total) by mouth 2 (two) times daily as needed for refractory nausea / vomiting. Start on day 3 after chemo. (Patient not taking: Reported on 09/17/2017) 30 tablet 1  . pantoprazole (PROTONIX) 40 MG tablet TK 1 T PO BID  6  . polycarbophil (FIBERCON) 625 MG tablet Take 2 tablets (1,250 mg total) by mouth 2 (two) times daily. 120 tablet 0  . potassium chloride SA (K-DUR,KLOR-CON) 20 MEQ tablet Take 1 tablet (20 mEq total) by mouth 2 (two) times daily. 60 tablet 0  . prochlorperazine (COMPAZINE) 10 MG tablet Take 1 tablet (10 mg total) by mouth every 6 (six)  hours as needed (Nausea or vomiting). (Patient not taking: Reported on 09/17/2017) 30 tablet 1  . saccharomyces boulardii (FLORASTOR) 250 MG capsule Take 1 capsule (250 mg total) by mouth 2 (two) times daily. 100 capsule 0  . sucralfate (CARAFATE) 1 g tablet Crush and mix in 10-15 mL of water prior to swallowing qAC and HS 120 tablet 1   No current facility-administered medications for this visit.     REVIEW OF SYSTEMS:    A 10+ POINT REVIEW OF SYSTEMS WAS OBTAINED including neurology, dermatology, psychiatry, cardiac, respiratory, lymph, extremities, GI, GU, Musculoskeletal, constitutional, breasts, reproductive, HEENT.  All pertinent positives are noted in the HPI.  All others are negative.   PHYSICAL EXAMINATION: ECOG PERFORMANCE STATUS: 1 - Symptomatic but completely ambulatory  VS reviewed in EPIC  GENERAL:alert, in no acute distress and comfortable SKIN: no acute rashes, no significant lesions EYES: conjunctiva are pink and non-injected, sclera anicteric OROPHARYNX: MMM, no exudates, no oropharyngeal erythema or ulceration NECK: supple, no JVD LYMPH:  no palpable lymphadenopathy in the cervical, axillary or inguinal regions LUNGS: clear to auscultation b/l with normal respiratory effort HEART: regular rate & rhythm ABDOMEN:  normoactive bowel sounds , non tender, not distended. No palpable hepatosplenomegaly.  Extremity: no pedal edema PSYCH: alert & oriented x 3 with fluent speech NEURO: no focal motor/sensory deficits   LABORATORY DATA:  I have reviewed the data as listed  . CBC Latest Ref Rng & Units 10/15/2017 10/08/2017 10/01/2017  WBC 3.9 - 10.3 K/uL 2.5(L) 1.7(L) 2.3(L)  Hemoglobin 11.6 - 15.9 g/dL 9.5(L) 9.6(L) 10.4(L)  Hematocrit 34.8 - 46.6 % 28.2(L) 28.6(L) 31.0(L)  Platelets 145 - 400 K/uL 74(L) 63(L) 105(L)    . CMP Latest Ref Rng & Units 10/15/2017 10/08/2017 10/01/2017  Glucose 70 - 99  mg/dL 107(H) 87 124(H)  BUN 8 - 23 mg/dL _0 Creatinine 0.44 - 1.00  mg/dL 0.68 0.64 0.68  Sodium 135 - 145 mmol/L 144 141 140  Potassium 3.5 - 5.1 mmol/L 3.7 4.1 4.3  Chloride 98 - 111 mmol/L 109 108 107  CO2 22 - 32 mmol/L _1 Calcium 8.9 - 10.3 mg/dL 9.5 9.5 9.7  Total Protein 6.5 - 8.1 g/dL 6.5 6.6 6.3(L)  Total Bilirubin 0.3 - 1.2 mg/dL 0.3 0.3 0.3  Alkaline Phos 38 - 126 U/L 64 69 60  AST 15 - 41 U/L 13(L) 17 15  ALT 0 - 44 U/L _2 05/19/17 Fine Needle Aspiration:  05/19/17 Bronchial Washing Specimen B:   RADIOGRAPHIC STUDIES: I have personally reviewed the radiological images as listed and agreed with the findings in the report. No results found. MRI brain 04/21/2017: IMPRESSION: 1. Motion degraded examination. 2. 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 3. Otherwise negative MRI of the head with and without contrast for age.  Electronically Signed   By: Elon Alas M.D.   On: 04/22/2017 00:34    ASSESSMENT & PLAN:   62 y.o. female with  1. Recently diagnosed Non-Small Cell Carcinoma on bronchoscopy/cytology. Not enough tissue for further characterization or mutation testing. Presenting with mediastinal nodal mass - TxN2 Mx (Atleast Stage III disease) ? Lingular primary vs inflammation. No overt evidence of metastatic disease.  -EGFR mutation studies on blood negative. Patient is s/p 3 cycles of Carboplatin/taxol -resolution left upper lobe ground-glass opacity has resolved in the interval. 08/19/17 PET revealed  No significant change in size of hypermetabolic AP window mass. The degree of FDG uptake is slightly increased in the interval. 2. Persistent hypermetabolic focus involving the distal aspect of the tongue within SUV max of 16.46. Similar to previous exam. (no clinical co-relate). Lung involvement has decreased.   08/19/17 Brain MRI revealed No evidence of intracranial metastases. Interval resolution of bilateral parietal signal abnormality favoring PRES    Currently on concurrent Carbo/taxol chemotherapy + RT  2. Bone Pain from neulasta - managed with Tylenol  3. Recent SZ -currently on Keppra. MRI brain - resolution of findings of PRES  4. rt calf DVT - off anticoagulation due to massive GI bleeding. S/p IVC filter.  5. S/p Recent GI bleeding from Gastric ulcer --needing clipping and arterial embolization. hgb normal at 10.4  today. On PPI BID  6. Low Magnesium -On oral magnesium 244m BID  7. H/o ctx /steroid related thrush --now resolved.   PLAN:  -Will transfuse if HGB <8 -Patient's lung capacity revealed moderate diffusion problems in addition to moderate obstruction before treatment  -Discussed pt labwork today, 10/15/17; blood chemistries are stable. HGB stable at 9.5, PLT increased to 74k, ANC at 1.4k -Pt has completed Carboplatin/Taxol -Continue Magnesium replacement -Continue Sucralfate -Offered to refer pt to our nutritional therapist, she would not like this at this time -Pt has completed chemotherapy and will finish RT on 10/20/17 -Discussed the option for Durvalumab. Will discuss this role for immunotherapy further after PET/CT -PET/CT 8 weeks after finishing RT -Will see the pt back in 3-4 weeks unless she develops any new concerns in the interim    RTC with Dr KIrene Limboin 3-4 weeks with labs  All of the patients questions were answered with apparent satisfaction. The patient knows to call the clinic with any problems, questions or concerns.  The total time spent in the  appt was 30 minutes and more than 50% was on counseling and direct patient cares.    Sullivan Lone MD Kayla AAHIVMS [facility] Hematology/Oncology Physician [facility]  (Office):       [phone] (Work cell):  [phone] (Fax):           [phone]  10/15/2017 11:55 AM  I, Baldwin [place], am acting as a scribe for Dr. Irene Limbo  .I have reviewed the above documentation for accuracy and completeness, and I agree with the above. Brunetta Genera MD

## 2017-10-15 ENCOUNTER — Ambulatory Visit
Admission: RE | Admit: 2017-10-15 | Discharge: 2017-10-15 | Disposition: A | Discharge status: Home / Self Care | Payer: BLUE CROSS/BLUE SHIELD | Source: Ambulatory Visit | Attending: Radiation Oncology | Admitting: Radiation Oncology

## 2017-10-15 ENCOUNTER — Inpatient Hospital Stay: Payer: BLUE CROSS/BLUE SHIELD

## 2017-10-15 ENCOUNTER — Inpatient Hospital Stay (HOSPITAL_BASED_OUTPATIENT_CLINIC_OR_DEPARTMENT_OTHER): Payer: BLUE CROSS/BLUE SHIELD | Admitting: Hematology

## 2017-10-15 VITALS — BP 123/52 | HR 95 | Temp 98.2°F | Resp 18 | Ht 63.0 in | Wt 128.8 lb

## 2017-10-15 DIAGNOSIS — C3490 Malignant neoplasm of unspecified part of unspecified bronchus or lung: Secondary | ICD-10-CM | POA: Diagnosis not present

## 2017-10-15 DIAGNOSIS — I824Z1 Acute embolism and thrombosis of unspecified deep veins of right distal lower extremity: Secondary | ICD-10-CM

## 2017-10-15 DIAGNOSIS — M898X9 Other specified disorders of bone, unspecified site: Secondary | ICD-10-CM | POA: Diagnosis not present

## 2017-10-15 DIAGNOSIS — C383 Malignant neoplasm of mediastinum, part unspecified: Secondary | ICD-10-CM | POA: Diagnosis not present

## 2017-10-15 DIAGNOSIS — Z95828 Presence of other vascular implants and grafts: Secondary | ICD-10-CM

## 2017-10-15 DIAGNOSIS — Z87891 Personal history of nicotine dependence: Secondary | ICD-10-CM

## 2017-10-15 DIAGNOSIS — C3491 Malignant neoplasm of unspecified part of right bronchus or lung: Secondary | ICD-10-CM

## 2017-10-15 LAB — CBC WITH DIFFERENTIAL/PLATELET
Basophils Absolute: 0 10*3/uL (ref 0.0–0.1)
Basophils Relative: 0 %
Eosinophils Absolute: 0 10*3/uL (ref 0.0–0.5)
Eosinophils Relative: 1 %
HCT: 28.2 % — ABNORMAL LOW (ref 34.8–46.6)
Hemoglobin: 9.5 g/dL — ABNORMAL LOW (ref 11.6–15.9)
Lymphocytes Relative: 29 %
Lymphs Abs: 0.7 10*3/uL — ABNORMAL LOW (ref 0.9–3.3)
MCH: 34.8 pg — ABNORMAL HIGH (ref 25.1–34.0)
MCHC: 33.7 g/dL (ref 31.5–36.0)
MCV: 103.3 fL — ABNORMAL HIGH (ref 79.5–101.0)
Monocytes Absolute: 0.3 10*3/uL (ref 0.1–0.9)
Monocytes Relative: 13 %
Neutro Abs: 1.4 10*3/uL — ABNORMAL LOW (ref 1.5–6.5)
Neutrophils Relative %: 57 %
Platelets: 74 10*3/uL — ABNORMAL LOW (ref 145–400)
RBC: 2.73 MIL/uL — ABNORMAL LOW (ref 3.70–5.45)
RDW: 15.7 % — ABNORMAL HIGH (ref 11.2–14.5)
WBC: 2.5 10*3/uL — ABNORMAL LOW (ref 3.9–10.3)

## 2017-10-15 LAB — CMP (CANCER CENTER ONLY)
ALT: 10 U/L (ref 0–44)
AST: 13 U/L — ABNORMAL LOW (ref 15–41)
Albumin: 3.3 g/dL — ABNORMAL LOW (ref 3.5–5.0)
Alkaline Phosphatase: 64 U/L (ref 38–126)
Anion gap: 11 (ref 5–15)
BUN: 12 mg/dL (ref 8–23)
CO2: 24 mmol/L (ref 22–32)
Calcium: 9.5 mg/dL (ref 8.9–10.3)
Chloride: 109 mmol/L (ref 98–111)
Creatinine: 0.68 mg/dL (ref 0.44–1.00)
GFR, Est AFR Am: >60 mL/min (ref >60)
GFR, Estimated: >60 mL/min (ref >60)
Glucose, Bld: 107 mg/dL — ABNORMAL HIGH (ref 70–99)
Potassium: 3.7 mmol/L (ref 3.5–5.1)
Sodium: 144 mmol/L (ref 135–145)
Total Bilirubin: 0.3 mg/dL (ref 0.3–1.2)
Total Protein: 6.5 g/dL (ref 6.5–8.1)

## 2017-10-15 LAB — MAGNESIUM: Magnesium: 1.5 mg/dL — ABNORMAL LOW (ref 1.7–2.4)

## 2017-10-15 MED ORDER — HEPARIN SOD (PORK) LOCK FLUSH 100 UNIT/ML IV SOLN
500.0000 [IU] | Freq: Once | INTRAVENOUS | Status: AC | PRN
  Administered 2017-10-15: 500 [IU]
  Filled 2017-10-15: qty 5

## 2017-10-15 MED ORDER — SODIUM CHLORIDE 0.9% FLUSH
10.0000 mL | INTRAVENOUS | Status: DC | PRN
  Administered 2017-10-15: 10 mL
  Filled 2017-10-15: qty 10

## 2017-10-16 ENCOUNTER — Ambulatory Visit
Admission: RE | Admit: 2017-10-16 | Discharge: 2017-10-16 | Disposition: A | Discharge status: Home / Self Care | Payer: BLUE CROSS/BLUE SHIELD | Source: Ambulatory Visit | Attending: Radiation Oncology | Admitting: Radiation Oncology

## 2017-10-16 ENCOUNTER — Telehealth: Payer: Self-pay

## 2017-10-16 DIAGNOSIS — C383 Malignant neoplasm of mediastinum, part unspecified: Secondary | ICD-10-CM | POA: Diagnosis not present

## 2017-10-16 NOTE — Progress Notes (Signed)
HPI: Follow-up paroxysmal atrial fibrillation.  Patient admitted with GI bleed January 2019.  EGD showed gastric ulcer which was clipped.  She developed atrial fibrillation with rapid ventricular response which was treated with amiodarone.  Echocardiogram Feb 2019 showed ejection fraction 55 to 60% and grade 1 diastolic dysfunction.  She also was found to have a new mass on chest CT and has subsequently been diagnosed with nonsense small cell lung cancer.  Finally she was noted to have gastrocnemius DVT.  IVC filter was placed. Since last seen, patient denies dyspnea, chest pain, palpitations or syncope.  Current Outpatient Medications  Medication Sig Dispense Refill  . acetaminophen (TYLENOL) 325 MG tablet Take 1-2 tablets (325-650 mg total) by mouth every 4 (four) hours as needed for mild pain.    . budesonide-formoterol (SYMBICORT) 80-4.5 MCG/ACT inhaler Inhale 2 puffs into the lungs 2 (two) times daily. 1 Inhaler 12  . cholecalciferol (VITAMIN D) 1000 units tablet Take 1,000 Units by mouth 2 (two) times daily.    [name] dexamethasone (DECADRON) 4 MG tablet Take 2 tablets (8 mg total) by mouth daily. Start the day after chemotherapy for 2 days. 30 tablet 1  . levETIRAcetam (KEPPRA) 500 MG tablet TK 1 T PO BID  6  . lidocaine-prilocaine (EMLA) cream Apply to affected area once 30 g 3  . LORazepam (ATIVAN) 0.5 MG tablet Take 2 tablets (1 mg total) by mouth once as needed for up to 1 dose (prior to PET/CT and MRI brain for anxiety). 6 tablet 0  . magnesium chloride (SLOW-MAG) 64 MG TBEC SR tablet Take 2 tablets (128 mg total) by mouth daily. 60 tablet 1  . ondansetron (ZOFRAN) 8 MG tablet Take 1 tablet (8 mg total) by mouth 2 (two) times daily as needed for refractory nausea / vomiting. Start on day 3 after chemo. 30 tablet 1  . pantoprazole (PROTONIX) 40 MG tablet TK 1 T PO BID  6  . polycarbophil (FIBERCON) 625 MG tablet Take 2 tablets (1,250 mg total) by mouth 2 (two) times daily. 120 tablet 0    . potassium chloride SA (K-DUR,KLOR-CON) 20 MEQ tablet Take 1 tablet (20 mEq total) by mouth 2 (two) times daily. 60 tablet 0  . prochlorperazine (COMPAZINE) 10 MG tablet Take 1 tablet (10 mg total) by mouth every 6 (six) hours as needed (Nausea or vomiting). 30 tablet 1  . saccharomyces boulardii (FLORASTOR) 250 MG capsule Take 1 capsule (250 mg total) by mouth 2 (two) times daily. 100 capsule 0  . sucralfate (CARAFATE) 1 g tablet Crush and mix in 10-15 mL of water prior to swallowing qAC and HS 120 tablet 1   No current facility-administered medications for this visit.      Past Medical History:  Diagnosis Date  . Acute blood loss anemia 04/02/2017  . Anxiety   . Gastrointestinal hemorrhage with melena   . Headache   . Hypercholesteremia   . Hypertension   . Persistent atrial fibrillation with rapid ventricular response ([place]) 04/07/2017  . Seizures ([place])    04/2017 while in hospital at [facility]  . Vitamin D deficiency     Past Surgical History:  Procedure Laterality Date  . COLONOSCOPY Left 04/09/2017   Procedure: COLONOSCOPY;  Surgeon: Carol Ada, MD;  Location: [place];  Service: Endoscopy;  Laterality: Left;  . ESOPHAGOGASTRODUODENOSCOPY N/A 04/03/2017   Procedure: ESOPHAGOGASTRODUODENOSCOPY (EGD);  Surgeon: Carol Ada, MD;  Location: [place];  Service: Endoscopy;  Laterality: N/A;  . ESOPHAGOGASTRODUODENOSCOPY (  EGD) WITH PROPOFOL N/A 04/12/2017   Procedure: ESOPHAGOGASTRODUODENOSCOPY (EGD) WITH PROPOFOL;  Surgeon: Milus Banister, MD;  Location: [facility] ENDOSCOPY;  Service: Endoscopy;  Laterality: N/A;  . IR ANGIOGRAM SELECTIVE EACH ADDITIONAL VESSEL  04/12/2017  . IR ANGIOGRAM SELECTIVE EACH ADDITIONAL VESSEL  04/13/2017  . IR ANGIOGRAM SELECTIVE EACH ADDITIONAL VESSEL  04/13/2017  . IR ANGIOGRAM SELECTIVE EACH ADDITIONAL VESSEL  04/13/2017  . IR ANGIOGRAM VISCERAL SELECTIVE  04/12/2017  . IR ANGIOGRAM VISCERAL SELECTIVE  04/13/2017  . IR EMBO ART  VEN HEMORR LYMPH EXTRAV  INC  GUIDE ROADMAPPING  04/12/2017  . IR EMBO ART  VEN HEMORR LYMPH EXTRAV  INC GUIDE ROADMAPPING  04/13/2017  . IR FLUORO GUIDE CV LINE RIGHT  04/13/2017  . IR FLUORO GUIDE PORT INSERTION RIGHT  07/27/2017  . IR IVC FILTER PLMT / S&I /IMG GUID/MOD SED  04/12/2017  . IR US GUIDE VASC ACCESS RIGHT  04/12/2017  . IR US GUIDE VASC ACCESS RIGHT  04/13/2017  . IR US GUIDE VASC ACCESS RIGHT  04/13/2017  . IR US GUIDE VASC ACCESS RIGHT  07/27/2017  . LEG SURGERY  1974   Blood Clot Removal   . VIDEO BRONCHOSCOPY WITH ENDOBRONCHIAL ULTRASOUND N/A 05/19/2017   Procedure: VIDEO BRONCHOSCOPY WITH ENDOBRONCHIAL ULTRASOUND;  Surgeon: Grace Isaac, MD;  Location: [place];  Service: Thoracic;  Laterality: N/A;    Social History   Socioeconomic History  . Marital status: Single    Spouse name: Not on file  . Number of children: Not on file  . Years of education: Not on file  . Highest education level: Not on file  Occupational History  . Occupation: Has to lift heavy boxes at times.    Employer: [employer]  Social Needs  . Financial resource strain: Not on file  . Food insecurity:    Worry: Not on file    Inability: Not on file  . Transportation needs:    Medical: Not on file    Non-medical: Not on file  Tobacco Use  . Smoking status: Former Smoker    Packs/day: 0.50    Years: 40.00    Pack years: 20.00    Types: Cigarettes    Last attempt to quit: 03/31/2017    Years since quitting: 0.5  . Smokeless tobacco: Never Used  Substance and Sexual Activity  . Alcohol use: No    Frequency: Never  . Drug use: No  . Sexual activity: Not Currently  Lifestyle  . Physical activity:    Days per week: Not on file    Minutes per session: Not on file  . Stress: Not on file  Relationships  . Social connections:    Talks on phone: Not on file    Gets together: Not on file    Attends religious service: Not on file    Active member of club or organization: Not on file    Attends meetings of clubs or organizations:  Not on file    Relationship status: Not on file  . Intimate partner violence:    Fear of current or ex partner: Not on file    Emotionally abused: Not on file    Physically abused: Not on file    Forced sexual activity: Not on file  Other Topics Concern  . Not on file  Social History Narrative  . Not on file    Family History  Problem Relation Age of Onset  . Heart disease Father     ROS:  no fevers or chills, productive cough, hemoptysis, dysphasia, odynophagia, melena, hematochezia, dysuria, hematuria, rash, seizure activity, orthopnea, PND, pedal edema, claudication. Remaining systems are negative.  Physical Exam: Well-developed well-nourished in no acute distress.  Skin is warm and dry.  HEENT is normal.  Neck is supple.  Chest is clear to auscultation with normal expansion.  Cardiovascular exam is regular rate and rhythm.  Abdominal exam nontender or distended. No masses palpated. Extremities show no edema. neuro grossly intact  ECG-sinus rhythm at a rate of 82.  No ST changes.  Personally reviewed  A/P  1 paroxysmal atrial fibrillation-in setting of GI bleed and EGD.  She has not had recurrences.  Given the episode occurred in the setting of acute illness and other medical issues (including lung cancer with h/o hemoptysis) we have elected not to anticoagulate.  2 hypertension-blood pressure is controlled on no medications.  3 hyperlipidemia-management per primary care.  4 non-small cell lung cancer-followed by oncology.  5 history of GI bleed  6 prior DVT-occurred in the distal vein and patient has an IVC filter in place.  Not anticoagulated because of outlined reasons above.  Kirk Ruths, MD

## 2017-10-16 NOTE — Telephone Encounter (Signed)
Called patient and tried to leave a voice mail concerning a appointment that was scheduled for her per 8/8 los. Was unable to leave a message . I Levada Dy) will be mailing her a letter with a calender enclosed.

## 2017-10-19 ENCOUNTER — Ambulatory Visit
Admission: RE | Admit: 2017-10-19 | Discharge: 2017-10-19 | Disposition: A | Discharge status: Home / Self Care | Payer: BLUE CROSS/BLUE SHIELD | Source: Ambulatory Visit | Attending: Radiation Oncology | Admitting: Radiation Oncology

## 2017-10-19 DIAGNOSIS — C383 Malignant neoplasm of mediastinum, part unspecified: Secondary | ICD-10-CM | POA: Diagnosis not present

## 2017-10-20 ENCOUNTER — Encounter: Payer: Self-pay | Admitting: Cardiology

## 2017-10-20 ENCOUNTER — Ambulatory Visit
Admission: RE | Admit: 2017-10-20 | Discharge: 2017-10-20 | Disposition: A | Discharge status: Home / Self Care | Payer: BLUE CROSS/BLUE SHIELD | Source: Ambulatory Visit | Attending: Radiation Oncology | Admitting: Radiation Oncology

## 2017-10-20 ENCOUNTER — Ambulatory Visit (INDEPENDENT_AMBULATORY_CARE_PROVIDER_SITE_OTHER): Payer: BLUE CROSS/BLUE SHIELD | Admitting: Cardiology

## 2017-10-20 ENCOUNTER — Encounter: Payer: Self-pay | Admitting: Radiation Oncology

## 2017-10-20 VITALS — BP 118/64 | HR 82 | Ht 63.0 in | Wt 128.4 lb

## 2017-10-20 DIAGNOSIS — E7800 Pure hypercholesterolemia, unspecified: Secondary | ICD-10-CM | POA: Diagnosis not present

## 2017-10-20 DIAGNOSIS — I10 Essential (primary) hypertension: Secondary | ICD-10-CM

## 2017-10-20 DIAGNOSIS — I480 Paroxysmal atrial fibrillation: Secondary | ICD-10-CM | POA: Diagnosis not present

## 2017-10-20 DIAGNOSIS — C383 Malignant neoplasm of mediastinum, part unspecified: Secondary | ICD-10-CM | POA: Diagnosis not present

## 2017-10-20 DIAGNOSIS — I824Z9 Acute embolism and thrombosis of unspecified deep veins of unspecified distal lower extremity: Secondary | ICD-10-CM | POA: Diagnosis not present

## 2017-10-20 NOTE — Patient Instructions (Signed)
Your physician wants you to follow-up in: 6 MONTHS WITH DR CRENSHAW You will receive a reminder letter in the mail two months in advance. If you don't receive a letter, please call our office to schedule the follow-up appointment.   If you need a refill on your cardiac medications before your next appointment, please call your pharmacy.  

## 2017-10-26 NOTE — Progress Notes (Signed)
  Radiation Oncology         [phone]) [phone] ________________________________  Name: Kayla Wyatt MRN: [id number]  Date: 10/20/2017  DOB: Jul 30, 1955  End of Treatment Note  Diagnosis:  62 y.o. female with At least Stage III, TxN2Mx, NSCLC of the AP window with probable inflammatory change of the lingula    Indication for treatment::  curative       Radiation treatment dates:   09/03/2017 - 10/20/2017  Site/dose:   The patient was treated to the disease within the mediastinum initially to a dose of 60 Gy in 30 fractions using a 5 field, 3-D conformal technique. The patient then received a cone down boost treatment for an additional 6 Gy delievered in 3 fractions. This yielded a final total dose of 66 Gy.   Narrative: The patient tolerated radiation treatment relatively well with concurrent chemotherapy.   She noted some increased fatigue. The patient did experience esophagitis during the course of treatment which required management with Carafate. She also developed some erythema and itching to her posterior chest and is applying Radiaplex daily.  Plan: The patient has completed radiation treatment. The patient will return to radiation oncology clinic for routine followup in one month. I advised the patient to call or return sooner if they have any questions or concerns related to their recovery or treatment. ________________________________  Jodelle Gross, MD, PhD  This document serves as a record of services personally performed by Kyung Rudd, MD. It was created on his behalf by Rae Lips, a trained medical scribe. The creation of this record is based on the scribe's personal observations and the provider's statements to them. This document has been checked and approved by the attending provider.

## 2017-11-13 NOTE — Progress Notes (Signed)
HEMATOLOGY/ONCOLOGY CLINIC NOTE  Date of Service: 11/16/2017   Patient Care Team: [name], MD as PCP - General (Family Medicine)  CHIEF COMPLAINTS/PURPOSE OF CONSULTATION:  F/u for continued management of recently diagnosed Non-Small Cell lung Carcinoma  HISTORY OF PRESENTING ILLNESS:   Kayla Wyatt is a wonderful 62 y.o. female who has been referred to [place] by Dr [name] for evaluation and management of Non-Small Cell Carcinoma. She is accompanied today by her husband. The pt reports that she is doing well overall.   Patient recently had a significant hospitalization on 04/02/2017 - as per DC summary by [name] " with abdominal pain, hematemesis, melena and ABLA due to GIB.  She underwent clipping of gastric ulcer and received multiple units PRBC but continued to have drop in H/H with bloody stool. NM GI scan was negative for bleeding and she underwent colonoscopy by Dr. [name] 1/31 showing diverticulosis but no signs of bleeding. Hospital course significant for R- gastrocnemius DVT, PAF, incidental findings of mediastinal mass as well as brief episode of hematuria. She was cleared to start on Xarelto on 02/01 for treatment of DVT. On 2/2 am she developed hematemesis with maroon stools and hypotension requiring fluid bolus as well as 2 units PRBCs. She continued to decline requiring intubation, pressors as well as reversal of anticoagulation with Kcentra on 02/3. She underwent UGI revealing large clot with fresh blood in stomach and underwent mesenteric arteriogram with percutaneous coil embolization fo distal tributary of left gastric artery and placement of IVC filter by interventional radiology.   A fib with RVR felt to be due to hemorrhagic shock and she converted to NSR. [name] felt no further cardiac work up indicated and did not recommend initiating anticoagulation in short term. To follow up with [name] after PET scan and routine biopsy after medically  stable. She continued to have bleeding and underwent visceral angiogram with embolization of inferior division of splenic artery and associated gastric arteries on 2/4. No surgical intervention needed per [name] with recommendations to continue monitoring H/H as well as for recurrence of hematochezia. She tolerated extubation on 2/6 and respiratory status improving.  On 2/11, she has episode of unresponsiveness with jerking of bilateral limbs that lasted about 3 minutes. She had amnesia of events but was back to baseline. EEG revealed "focal slowing over the right temporo-occipital regionandoccasional epileptiform discharges over the right occipital region". Head CT reviewed, unremarkable for acute intracranial process. MRI brain done revealing mild biparietal signal abnormality suspicious for PRES. [name] recommended starting patient on Keppra BID with repeat MRI in 2 weeks. New onset seizure likely provoked by PRES--if resolved--Ok to take patient off Keppra. Patient with resultant generalized weakness. CIR recommended due to functional deficits. "   Later as outpatient she had a PET scan on 05/14/17 which showed a hypermetabolic mediastinal mass which was subsequently biopsied with a bronchoscopy on 05/19/17. She notes that she had smoked for about 35-40 years, half a pack of a day. She reports that she has not had any other symptoms related to her smoking; she has never had to be on home oxygen or inhalers. She has recently begun Symbicort. While being in the hospital she lost about 20 lbs but has maintained her weight since being discharged.  The pt and her husband note that she is concerned about what to do if she is not able to go back to work after her 90-day leave. We recommended that they speak with  our social workers here to explore the available options for insurance coverage /disability application etc.  Of note prior to the patient's visit today, pt has had PET/CT completed on  05/14/17 with results revealing 1. The known mediastinal mass is intensely hypermetabolic, worrisome for malignancy. This could reflect lymphoma or small cell lung cancer. Tissue sampling recommended.2. No peripheral lung mass identified. New patchy ground-glass opacity in the lingula is mildly hypermetabolic and likely inflammatory.  On 05/19/17 the pt had a Fine Needle Aspiration which revealed Non-Small cell lung carcinoma.  Most recent lab results (05/19/17) of CBC  is as follows: all values are WNL except for RBC at 2.90, Hgb at 8.6, HCT at 28.0, RDW at 16.4.  On review of systems, pt reports knee pain, and denies abdominal pains, leg swelling, SOB, CP, and any other symptoms.   On PMHx the pt reports acute blood loss anemia, GI hemorrhage with melena, hypercholesteremia, HTN, persistent atrial fibrillation with rapid ventricular response, Vitamin D deficiency, denies liver and kidney problems. On Social Hx the pt reports having smoked for 35-40 years of half a pack per day. She quit smoking on 03/31/17.  On Family Hx the pt reports that her father had heart disease.  Interval History:   Kayla Wyatt returns today regarding her Non-Small Cell Carcinoma. The patient's last visit with [place] was on 10/15/17. She is accompanied today by her husband. The pt reports that she is doing well overall. In the interim the pt finished RT with Dr. Lisbeth Renshaw on 10/20/17.   The pt reports that she has not had any new concerns in the interim. She continues to have some pain when swallowing after finishing RT but denies any problems swallowing as such. She also notes that she is breathing well. She notes that she has been eating well, staying hydrated, and keeping active.   The pt also notes that she has had diarrhea every day and has continued taking Magnesium replacement BID.  Lab results today (11/16/17) of CBC w/diff, CMP, and Reticulocytes is as follows: all values are WNL except for RBC at 3.34, HGB at 11.4, HCT at  34.3, MCV at 102.7, MCH at 34.1, Sodium at 146, Glucose at 102. 11/16/17 Phosphorous at 3.8 11/16/17 Magnesium at 1.6  On review of systems, pt reports some throat pain when swallowing, eating well, staying hydrated, staying active, stable weight, diarrhea, and denies fevers, chills, night sweats, SOB, difficulty breathing, difficulty swallowing, problems with the port, blood in the stools, and any other symptoms.    MEDICAL HISTORY:  Past Medical History:  Diagnosis Date  . Acute blood loss anemia 04/02/2017  . Anxiety   . Gastrointestinal hemorrhage with melena   . Headache   . Hypercholesteremia   . Hypertension   . Persistent atrial fibrillation with rapid ventricular response ([place]) 04/07/2017  . Seizures ([place])    04/2017 while in hospital at [facility]  . Vitamin D deficiency     SURGICAL HISTORY: Past Surgical History:  Procedure Laterality Date  . COLONOSCOPY Left 04/09/2017   Procedure: COLONOSCOPY;  Surgeon: Carol Ada, MD;  Location: [place];  Service: Endoscopy;  Laterality: Left;  . ESOPHAGOGASTRODUODENOSCOPY N/A 04/03/2017   Procedure: ESOPHAGOGASTRODUODENOSCOPY (EGD);  Surgeon: Carol Ada, MD;  Location: [place];  Service: Endoscopy;  Laterality: N/A;  . ESOPHAGOGASTRODUODENOSCOPY (EGD) WITH PROPOFOL N/A 04/12/2017   Procedure: ESOPHAGOGASTRODUODENOSCOPY (EGD) WITH PROPOFOL;  Surgeon: Milus Banister, MD;  Location: [place] [facility];  Service: Endoscopy;  Laterality: N/A;  . IR ANGIOGRAM SELECTIVE  EACH ADDITIONAL VESSEL  04/12/2017  . IR ANGIOGRAM SELECTIVE EACH ADDITIONAL VESSEL  04/13/2017  . IR ANGIOGRAM SELECTIVE EACH ADDITIONAL VESSEL  04/13/2017  . IR ANGIOGRAM SELECTIVE EACH ADDITIONAL VESSEL  04/13/2017  . IR ANGIOGRAM VISCERAL SELECTIVE  04/12/2017  . IR ANGIOGRAM VISCERAL SELECTIVE  04/13/2017  . IR EMBO ART  VEN HEMORR LYMPH EXTRAV  INC GUIDE ROADMAPPING  04/12/2017  . IR EMBO ART  VEN HEMORR LYMPH EXTRAV  INC GUIDE ROADMAPPING  04/13/2017  . IR FLUORO GUIDE CV LINE RIGHT   04/13/2017  . IR FLUORO GUIDE PORT INSERTION RIGHT  07/27/2017  . IR IVC FILTER PLMT / S&I /IMG GUID/MOD SED  04/12/2017  . IR US GUIDE VASC ACCESS RIGHT  04/12/2017  . IR US GUIDE VASC ACCESS RIGHT  04/13/2017  . IR US GUIDE VASC ACCESS RIGHT  04/13/2017  . IR US GUIDE VASC ACCESS RIGHT  07/27/2017  . LEG SURGERY  1974   Blood Clot Removal   . VIDEO BRONCHOSCOPY WITH ENDOBRONCHIAL ULTRASOUND N/A 05/19/2017   Procedure: VIDEO BRONCHOSCOPY WITH ENDOBRONCHIAL ULTRASOUND;  Surgeon: Grace Isaac, MD;  Location: [place];  Service: Thoracic;  Laterality: N/A;    SOCIAL HISTORY: Social History   Socioeconomic History  . Marital status: Single    Spouse name: Not on file  . Number of children: Not on file  . Years of education: Not on file  . Highest education level: Not on file  Occupational History  . Occupation: Has to lift heavy boxes at times.    Employer: [employer]  Social Needs  . Financial resource strain: Not on file  . Food insecurity:    Worry: Not on file    Inability: Not on file  . Transportation needs:    Medical: Not on file    Non-medical: Not on file  Tobacco Use  . Smoking status: Former Smoker    Packs/day: 0.50    Years: 40.00    Pack years: 20.00    Types: Cigarettes    Last attempt to quit: 03/31/2017    Years since quitting: 0.6  . Smokeless tobacco: Never Used  Substance and Sexual Activity  . Alcohol use: No    Frequency: Never  . Drug use: No  . Sexual activity: Not Currently  Lifestyle  . Physical activity:    Days per week: Not on file    Minutes per session: Not on file  . Stress: Not on file  Relationships  . Social connections:    Talks on phone: Not on file    Gets together: Not on file    Attends religious service: Not on file    Active member of club or organization: Not on file    Attends meetings of clubs or organizations: Not on file    Relationship status: Not on file  . Intimate partner violence:    Fear of current or ex partner: Not  on file    Emotionally abused: Not on file    Physically abused: Not on file    Forced sexual activity: Not on file  Other Topics Concern  . Not on file  Social History Narrative  . Not on file    FAMILY HISTORY: Family History  Problem Relation Age of Onset  . Heart disease Father     ALLERGIES:  has No Known Allergies.  MEDICATIONS:  Current Outpatient Medications  Medication Sig Dispense Refill  . acetaminophen (TYLENOL) 325 MG tablet Take 1-2 tablets (325-650 mg total)  by mouth every 4 (four) hours as needed for mild pain.    . budesonide-formoterol (SYMBICORT) 80-4.5 MCG/ACT inhaler Inhale 2 puffs into the lungs 2 (two) times daily. 1 Inhaler 12  . cholecalciferol (VITAMIN D) 1000 units tablet Take 1,000 Units by mouth 2 (two) times daily.    [name] dexamethasone (DECADRON) 4 MG tablet Take 2 tablets (8 mg total) by mouth daily. Start the day after chemotherapy for 2 days. 30 tablet 1  . levETIRAcetam (KEPPRA) 500 MG tablet TK 1 T PO BID  6  . lidocaine-prilocaine (EMLA) cream Apply to affected area once 30 g 3  . LORazepam (ATIVAN) 0.5 MG tablet Take 2 tablets (1 mg total) by mouth once as needed for up to 1 dose (prior to PET/CT and MRI brain for anxiety). 6 tablet 0  . magnesium chloride (SLOW-MAG) 64 MG TBEC SR tablet Take 2 tablets (128 mg total) by mouth daily. 60 tablet 1  . ondansetron (ZOFRAN) 8 MG tablet Take 1 tablet (8 mg total) by mouth 2 (two) times daily as needed for refractory nausea / vomiting. Start on day 3 after chemo. 30 tablet 1  . pantoprazole (PROTONIX) 40 MG tablet TK 1 T PO BID  6  . polycarbophil (FIBERCON) 625 MG tablet Take 2 tablets (1,250 mg total) by mouth 2 (two) times daily. 120 tablet 0  . potassium chloride SA (K-DUR,KLOR-CON) 20 MEQ tablet Take 1 tablet (20 mEq total) by mouth 2 (two) times daily. 60 tablet 0  . prochlorperazine (COMPAZINE) 10 MG tablet Take 1 tablet (10 mg total) by mouth every 6 (six) hours as needed (Nausea or vomiting). 30  tablet 1  . saccharomyces boulardii (FLORASTOR) 250 MG capsule Take 1 capsule (250 mg total) by mouth 2 (two) times daily. 100 capsule 0  . sucralfate (CARAFATE) 1 g tablet Crush and mix in 10-15 mL of water prior to swallowing qAC and HS 120 tablet 1   No current facility-administered medications for this visit.     REVIEW OF SYSTEMS:    A 10+ POINT REVIEW OF SYSTEMS WAS OBTAINED including neurology, dermatology, psychiatry, cardiac, respiratory, lymph, extremities, GI, GU, Musculoskeletal, constitutional, breasts, reproductive, HEENT.  All pertinent positives are noted in the HPI.  All others are negative.   PHYSICAL EXAMINATION: ECOG PERFORMANCE STATUS: 1 - Symptomatic but completely ambulatory  VS reviewed in EPIC  GENERAL:alert, in no acute distress and comfortable SKIN: no acute rashes, no significant lesions EYES: conjunctiva are pink and non-injected, sclera anicteric OROPHARYNX: MMM, no exudates, no oropharyngeal erythema or ulceration NECK: supple, no JVD LYMPH:  no palpable lymphadenopathy in the cervical, axillary or inguinal regions LUNGS: clear to auscultation b/l with normal respiratory effort HEART: regular rate & rhythm ABDOMEN:  normoactive bowel sounds , non tender, not distended. No palpable hepatosplenomegaly.  Extremity: no pedal edema PSYCH: alert & oriented x 3 with fluent speech NEURO: no focal motor/sensory deficits   LABORATORY DATA:  I have reviewed the data as listed  . CBC Latest Ref Rng & Units 11/16/2017 10/15/2017 10/08/2017  WBC 3.9 - 10.3 K/uL 6.6 2.5(L) 1.7(L)  Hemoglobin 11.6 - 15.9 g/dL 11.4(L) 9.5(L) 9.6(L)  Hematocrit 34.8 - 46.6 % 34.3(L) 28.2(L) 28.6(L)  Platelets 145 - 400 K/uL 194 74(L) 63(L)    . CMP Latest Ref Rng & Units 11/16/2017 10/15/2017 10/08/2017  Glucose 70 - 99 mg/dL 102(H) 107(H) 87  BUN 8 - 23 mg/dL '11 12 11  '$ Creatinine 0.44 - 1.00 mg/dL 0.76 0.68 0.64  Sodium 135 - 145 mmol/L 146(H) 144 141  Potassium 3.5 - 5.1 mmol/L 4.1  3.7 4.1  Chloride 98 - 111 mmol/L 110 109 108  CO2 22 - 32 mmol/L '27 24 25  '$ Calcium 8.9 - 10.3 mg/dL 10.0 9.5 9.5  Total Protein 6.5 - 8.1 g/dL 7.0 6.5 6.6  Total Bilirubin 0.3 - 1.2 mg/dL 0.3 0.3 0.3  Alkaline Phos 38 - 126 U/L 57 64 69  AST 15 - 41 U/L 15 13(L) 17  ALT 0 - 44 U/L '8 10 18   '$ 05/19/17 Fine Needle Aspiration:  05/19/17 Bronchial Washing Specimen B:   RADIOGRAPHIC STUDIES: I have personally reviewed the radiological images as listed and agreed with the findings in the report. No results found. MRI brain 04/21/2017: IMPRESSION: 1. Motion degraded examination. 2. 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 3. Otherwise negative MRI of the head with and without contrast for age.  Electronically Signed   By: Elon Alas M.D.   On: 04/22/2017 00:34    ASSESSMENT & PLAN:   62 y.o. female with  1. Recently diagnosed Non-Small Cell Carcinoma on bronchoscopy/cytology.  Not enough tissue for further characterization or mutation testing. Presenting with mediastinal nodal mass - TxN2 Mx (Atleast Stage III disease) ? Lingular primary vs inflammation. No overt evidence of metastatic disease.  -EGFR mutation studies on blood negative. Patient is s/p 3 cycles of Carboplatin/taxol -resolution left upper lobe ground-glass opacity has resolved in the interval. 08/19/17 PET revealed  No significant change in size of hypermetabolic AP window mass. The degree of FDG uptake is slightly increased in the interval. 2. Persistent hypermetabolic focus involving the distal aspect of the tongue within SUV max of 16.46. Similar to previous exam. (no clinical co-relate). Lung involvement has decreased.   08/19/17 Brain MRI revealed No evidence of intracranial metastases. Interval resolution of bilateral parietal signal abnormality favoring PRES  Completed concurrent Carbo/taxol chemotherapy + RT  2. Bone Pain from neulasta - managed  with Tylenol  3. Recent SZ -currently on Keppra. MRI brain - resolution of findings of PRES  4. rt calf DVT - off anticoagulation due to massive GI bleeding. S/p IVC filter.  5. S/p Recent GI bleeding from Gastric ulcer --needing clipping and arterial embolization. hgb normal at 10.4  today. On PPI BID  6. Low Magnesium -On oral magnesium '200mg'$  BID  7. H/o ctx /steroid related thrush --now resolved.   PLAN:   -Discussed pt labwork today, 11/16/17; blood counts and chemistries are stable -Continue sucralfate -Will complete PET/CT in 5 weeks -Discussed that after PET/CT, there will be a role to consider maintenance Durvalumab -Recommended that the pt continue to eat well, drink at least 48-64 oz of water each day, and walk 20-30 minutes each day.  -Recommended that the pt hold Magnesium for a couple days for her diarrhea, and begin eating nuts -Will see the pt back in 6 weeks with PET  -Will transfuse if HGB <8 -Patient's lung capacity revealed moderate diffusion problems in addition to moderate obstruction before treatment  -Offered to refer pt to our nutritional therapist, she would not like this at this time  PET/CT in 5 weeks  RTC with Dr Irene Limbo in 6 weeks with labs and port flush   All of the patients questions were answered with apparent satisfaction. The patient knows to call the clinic with any problems, questions or concerns.  The total time spent in the appt was 20 minutes and more than 50%  was on counseling and direct patient cares.    Sullivan Lone MD Kayla AAHIVMS [facility] Hematology/Oncology Physician [facility]  (Office):       [phone] (Work cell):  [phone] (Fax):           [phone]  11/16/2017 2:35 PM  I, Baldwin [place], am acting as a scribe for Dr. Irene Limbo  .I have reviewed the above documentation for accuracy and completeness, and I agree with the above. Brunetta Genera MD

## 2017-11-16 ENCOUNTER — Inpatient Hospital Stay: Payer: BLUE CROSS/BLUE SHIELD | Attending: Hematology | Admitting: Hematology

## 2017-11-16 ENCOUNTER — Inpatient Hospital Stay: Payer: BLUE CROSS/BLUE SHIELD

## 2017-11-16 VITALS — BP 148/75 | HR 125 | Temp 98.1°F | Resp 18 | Ht 63.0 in | Wt 128.1 lb

## 2017-11-16 DIAGNOSIS — Z87891 Personal history of nicotine dependence: Secondary | ICD-10-CM | POA: Diagnosis not present

## 2017-11-16 DIAGNOSIS — C3491 Malignant neoplasm of unspecified part of right bronchus or lung: Secondary | ICD-10-CM

## 2017-11-16 DIAGNOSIS — M898X9 Other specified disorders of bone, unspecified site: Secondary | ICD-10-CM | POA: Diagnosis not present

## 2017-11-16 DIAGNOSIS — C3490 Malignant neoplasm of unspecified part of unspecified bronchus or lung: Secondary | ICD-10-CM | POA: Diagnosis not present

## 2017-11-16 DIAGNOSIS — I824Z1 Acute embolism and thrombosis of unspecified deep veins of right distal lower extremity: Secondary | ICD-10-CM | POA: Insufficient documentation

## 2017-11-16 LAB — CBC WITH DIFFERENTIAL/PLATELET
Basophils Absolute: 0 10*3/uL (ref 0.0–0.1)
Basophils Relative: 0 %
Eosinophils Absolute: 0.1 10*3/uL (ref 0.0–0.5)
Eosinophils Relative: 1 %
HCT: 34.3 % — ABNORMAL LOW (ref 34.8–46.6)
Hemoglobin: 11.4 g/dL — ABNORMAL LOW (ref 11.6–15.9)
Lymphocytes Relative: 18 %
Lymphs Abs: 1.2 10*3/uL (ref 0.9–3.3)
MCH: 34.1 pg — ABNORMAL HIGH (ref 25.1–34.0)
MCHC: 33.2 g/dL (ref 31.5–36.0)
MCV: 102.7 fL — ABNORMAL HIGH (ref 79.5–101.0)
Monocytes Absolute: 0.6 10*3/uL (ref 0.1–0.9)
Monocytes Relative: 8 %
Neutro Abs: 4.8 10*3/uL (ref 1.5–6.5)
Neutrophils Relative %: 73 %
Platelets: 194 10*3/uL (ref 145–400)
RBC: 3.34 MIL/uL — ABNORMAL LOW (ref 3.70–5.45)
RDW: 14.3 % (ref 11.2–14.5)
WBC: 6.6 10*3/uL (ref 3.9–10.3)

## 2017-11-16 LAB — CMP (CANCER CENTER ONLY)
ALT: 8 U/L (ref 0–44)
AST: 15 U/L (ref 15–41)
Albumin: 3.7 g/dL (ref 3.5–5.0)
Alkaline Phosphatase: 57 U/L (ref 38–126)
Anion gap: 9 (ref 5–15)
BUN: 11 mg/dL (ref 8–23)
CO2: 27 mmol/L (ref 22–32)
Calcium: 10 mg/dL (ref 8.9–10.3)
Chloride: 110 mmol/L (ref 98–111)
Creatinine: 0.76 mg/dL (ref 0.44–1.00)
GFR, Est AFR Am: >60 mL/min (ref >60)
GFR, Estimated: >60 mL/min (ref >60)
Glucose, Bld: 102 mg/dL — ABNORMAL HIGH (ref 70–99)
Potassium: 4.1 mmol/L (ref 3.5–5.1)
Sodium: 146 mmol/L — ABNORMAL HIGH (ref 135–145)
Total Bilirubin: 0.3 mg/dL (ref 0.3–1.2)
Total Protein: 7 g/dL (ref 6.5–8.1)

## 2017-11-16 LAB — MAGNESIUM: Magnesium: 1.6 mg/dL — ABNORMAL LOW (ref 1.7–2.4)

## 2017-11-16 LAB — PHOSPHORUS: Phosphorus: 3.8 mg/dL (ref 2.5–4.6)

## 2017-11-17 ENCOUNTER — Telehealth: Payer: Self-pay

## 2017-11-17 NOTE — Telephone Encounter (Signed)
Spoke with patient concerning her upcoming appointment that was scheduled per 9/9 los. Mailed a letter with a calender, and CT number enclosed. Also offered to transfer call to that department for scheduling.

## 2017-11-30 ENCOUNTER — Ambulatory Visit
Admission: RE | Admit: 2017-11-30 | Discharge: 2017-11-30 | Disposition: A | Discharge status: Home / Self Care | Payer: BLUE CROSS/BLUE SHIELD | Source: Ambulatory Visit | Attending: Radiation Oncology | Admitting: Radiation Oncology

## 2017-11-30 ENCOUNTER — Other Ambulatory Visit: Payer: Self-pay

## 2017-11-30 ENCOUNTER — Encounter: Payer: Self-pay | Admitting: Radiation Oncology

## 2017-11-30 VITALS — BP 126/66 | HR 98 | Temp 98.0°F | Resp 20 | Ht 63.0 in | Wt 129.6 lb

## 2017-11-30 DIAGNOSIS — Z7901 Long term (current) use of anticoagulants: Secondary | ICD-10-CM | POA: Insufficient documentation

## 2017-11-30 DIAGNOSIS — Z923 Personal history of irradiation: Secondary | ICD-10-CM | POA: Diagnosis not present

## 2017-11-30 DIAGNOSIS — K209 Esophagitis, unspecified: Secondary | ICD-10-CM | POA: Insufficient documentation

## 2017-11-30 DIAGNOSIS — Z79899 Other long term (current) drug therapy: Secondary | ICD-10-CM | POA: Insufficient documentation

## 2017-11-30 DIAGNOSIS — I82401 Acute embolism and thrombosis of unspecified deep veins of right lower extremity: Secondary | ICD-10-CM | POA: Insufficient documentation

## 2017-11-30 DIAGNOSIS — C383 Malignant neoplasm of mediastinum, part unspecified: Secondary | ICD-10-CM

## 2017-11-30 DIAGNOSIS — C3490 Malignant neoplasm of unspecified part of unspecified bronchus or lung: Secondary | ICD-10-CM | POA: Diagnosis present

## 2017-11-30 NOTE — Progress Notes (Signed)
Radiation Oncology         [phone] ________________________________  Name: Kayla Wyatt MRN: [id number]  Date of Service: 11/30/2017 DOB: 1955-09-07  Post Treatment Note  CC: [name], MD  Brunetta Genera, MD  Diagnosis:   At least Stage III, TxN2Mx, NSCLC of the AP window with probable inflammatory change of the lingula    Interval Since Last Radiation:  6 weeks   09/03/2017 - 10/20/2017: The patient was treated to the disease within the mediastinum initially to a dose of 60 Gy in 30 fractions using a 5 field, 3-D conformal technique. The patient then received a cone down boost treatment for an additional 6 Gy delievered in 3 fractions. This yielded a final total dose of 66 Gy  Narrative:  The patient returns today for routine follow-up.  In summary this is a pleasant 62 year old woman with a history of at least stage III non-small cell lung cancer arising in the AP window.  She recently finished radiotherapy which completed on 10/20/2017 with concurrent chemotherapy with Taxol and carboplatin.  In the midst of her radiotherapy, she developed right lower extremity edema and had a prior history of right DVT complicated by a massive GI bleed following anticoagulation.  She had an IVC filter placed during that assessment, and in July 2019 during her treatment developed a new ultrasound confirmed DVT.  She was placed on Lovenox by myself at the recommendation of Dr. Irene Limbo, and reports that she use this up until a few weeks ago and the swelling resolved.  Since completing her radiotherapy, her swallowing is improving, she denies any trouble with breathing at this time or chest pain.  No other complaints are verbalized.  ALLERGIES:  has No Known Allergies.  Meds: Current Outpatient Medications  Medication Sig Dispense Refill  . acetaminophen (TYLENOL) 325 MG tablet Take 1-2 tablets (325-650 mg total) by mouth every 4 (four) hours as needed for mild pain.    .  budesonide-formoterol (SYMBICORT) 80-4.5 MCG/ACT inhaler Inhale 2 puffs into the lungs 2 (two) times daily. 1 Inhaler 12  . cholecalciferol (VITAMIN D) 1000 units tablet Take 1,000 Units by mouth 2 (two) times daily.    [name] levETIRAcetam (KEPPRA) 500 MG tablet TK 1 T PO BID  6  . lidocaine-prilocaine (EMLA) cream Apply to affected area once 30 g 3  . pantoprazole (PROTONIX) 40 MG tablet TK 1 T PO BID  6  . polycarbophil (FIBERCON) 625 MG tablet Take 2 tablets (1,250 mg total) by mouth 2 (two) times daily. 120 tablet 0  . potassium chloride SA (K-DUR,KLOR-CON) 20 MEQ tablet Take 1 tablet (20 mEq total) by mouth 2 (two) times daily. 60 tablet 0  . saccharomyces boulardii (FLORASTOR) 250 MG capsule Take 1 capsule (250 mg total) by mouth 2 (two) times daily. 100 capsule 0  . sucralfate (CARAFATE) 1 g tablet Crush and mix in 10-15 mL of water prior to swallowing qAC and HS 120 tablet 1  . dexamethasone (DECADRON) 4 MG tablet Take 2 tablets (8 mg total) by mouth daily. Start the day after chemotherapy for 2 days. (Patient not taking: Reported on 11/30/2017) 30 tablet 1  . LORazepam (ATIVAN) 0.5 MG tablet Take 2 tablets (1 mg total) by mouth once as needed for up to 1 dose (prior to PET/CT and MRI brain for anxiety). (Patient not taking: Reported on 11/30/2017) 6 tablet 0  . ondansetron (ZOFRAN) 8 MG tablet Take 1 tablet (8 mg total) by mouth 2 (two) times  daily as needed for refractory nausea / vomiting. Start on day 3 after chemo. (Patient not taking: Reported on 11/30/2017) 30 tablet 1  . prochlorperazine (COMPAZINE) 10 MG tablet Take 1 tablet (10 mg total) by mouth every 6 (six) hours as needed (Nausea or vomiting). (Patient not taking: Reported on 11/30/2017) 30 tablet 1   No current facility-administered medications for this encounter.     Physical Findings:  height is 5\' 3"  (1.6 m) and weight is 129 lb 9.6 oz (58.8 kg). Her oral temperature is 98 F (36.7 C). Her blood pressure is 126/66 and her pulse  is 98. Her respiration is 20 and oxygen saturation is 96%.  Pain Assessment Pain Score: 0-No pain/10 In general this is a well appearing Caucasian female in no acute distress. She's alert and oriented x4 and appropriate throughout the examination. Cardiopulmonary assessment is negative for acute distress and she exhibits normal effort with a regular rate and rhythm no clicks rubs or murmurs are auscultated, chest is clear to auscultation bilaterally.  Her right lower extremity edema has resolved.   Lab Findings: Lab Results  Component Value Date   WBC 6.6 11/16/2017   HGB 11.4 (L) 11/16/2017   HCT 34.3 (L) 11/16/2017   MCV 102.7 (H) 11/16/2017   PLT 194 11/16/2017     Radiographic Findings: No results found.  Impression/Plan: 1. At least Stage III, TxN2Mx, NSCLC of the AP window with probable inflammatory change of the lingula.     The patient is recovering well from the effects of radiotherapy.  Her esophagitis has improved, she continues to use Carafate and will do so until her symptoms completely resolve.  We would be happy to see her as needed moving forward regarding questions or concerns from her prior therapy. 2. Recurrent right lower extremity DVT.  The patient is not taking her Lovenox, and I will contact Dr. Rod Can to make sure he is in agreement with this.     Carola Rhine, PAC

## 2017-11-30 NOTE — Addendum Note (Signed)
Encounter addended by: Malena Edman, RN on: 11/30/2017 3:21 PM  Actions taken: Charge Capture section accepted

## 2017-12-03 ENCOUNTER — Encounter: Payer: BLUE CROSS/BLUE SHIELD | Admitting: Family Medicine

## 2017-12-24 ENCOUNTER — Ambulatory Visit (HOSPITAL_COMMUNITY)
Admission: RE | Admit: 2017-12-24 | Discharge: 2017-12-24 | Disposition: A | Discharge status: Home / Self Care | Payer: BLUE CROSS/BLUE SHIELD | Source: Ambulatory Visit | Attending: Hematology | Admitting: Hematology

## 2017-12-24 DIAGNOSIS — J438 Other emphysema: Secondary | ICD-10-CM | POA: Insufficient documentation

## 2017-12-24 DIAGNOSIS — N8110 Cystocele, unspecified: Secondary | ICD-10-CM | POA: Diagnosis not present

## 2017-12-24 DIAGNOSIS — R918 Other nonspecific abnormal finding of lung field: Secondary | ICD-10-CM | POA: Diagnosis not present

## 2017-12-24 DIAGNOSIS — C3491 Malignant neoplasm of unspecified part of right bronchus or lung: Secondary | ICD-10-CM | POA: Diagnosis not present

## 2017-12-24 DIAGNOSIS — I700 Atherosclerosis of aorta: Secondary | ICD-10-CM | POA: Insufficient documentation

## 2017-12-24 DIAGNOSIS — J432 Centrilobular emphysema: Secondary | ICD-10-CM | POA: Insufficient documentation

## 2017-12-24 DIAGNOSIS — N8189 Other female genital prolapse: Secondary | ICD-10-CM | POA: Insufficient documentation

## 2017-12-24 LAB — GLUCOSE, CAPILLARY: Glucose-Capillary: 88 mg/dL (ref 70–99)

## 2017-12-24 MED ORDER — FLUDEOXYGLUCOSE F - 18 (FDG) INJECTION
6.4000 | Freq: Once | INTRAVENOUS | Status: AC | PRN
  Administered 2017-12-24: 6.4 via INTRAVENOUS

## 2017-12-25 NOTE — Progress Notes (Signed)
Kayla Wyatt    HEMATOLOGY/ONCOLOGY CLINIC NOTE  Date of Service: 12/28/2017   Patient Care Team: [name], MD as PCP - General (Family Medicine)  CHIEF COMPLAINTS/PURPOSE OF CONSULTATION:  F/u for continued management of recently diagnosed Non-Small Cell lung Carcinoma  HISTORY OF PRESENTING ILLNESS:   Kayla Wyatt is a wonderful 62 y.o. female who has been referred to [place] by Dr [name] for evaluation and management of Non-Small Cell Carcinoma. She is accompanied today by her husband. The pt reports that she is doing well overall.   Patient recently had a significant hospitalization on 04/02/2017 - as per DC summary by [name] " with abdominal pain, hematemesis, melena and ABLA due to GIB.  She underwent clipping of gastric ulcer and received multiple units PRBC but continued to have drop in H/H with bloody stool. NM GI scan was negative for bleeding and she underwent colonoscopy by Dr. [name] 1/31 showing diverticulosis but no signs of bleeding. Hospital course significant for R- gastrocnemius DVT, PAF, incidental findings of mediastinal mass as well as brief episode of hematuria. She was cleared to start on Xarelto on 02/01 for treatment of DVT. On 2/2 am she developed hematemesis with maroon stools and hypotension requiring fluid bolus as well as 2 units PRBCs. She continued to decline requiring intubation, pressors as well as reversal of anticoagulation with Kcentra on 02/3. She underwent UGI revealing large clot with fresh blood in stomach and underwent mesenteric arteriogram with percutaneous coil embolization fo distal tributary of left gastric artery and placement of IVC filter by interventional radiology.   A fib with RVR felt to be due to hemorrhagic shock and she converted to NSR. [name] felt no further cardiac work up indicated and did not recommend initiating anticoagulation in short term. To follow up with [name] after PET scan and routine biopsy after medically  stable. She continued to have bleeding and underwent visceral angiogram with embolization of inferior division of splenic artery and associated gastric arteries on 2/4. No surgical intervention needed per [name] with recommendations to continue monitoring H/H as well as for recurrence of hematochezia. She tolerated extubation on 2/6 and respiratory status improving.  On 2/11, she has episode of unresponsiveness with jerking of bilateral limbs that lasted about 3 minutes. She had amnesia of events but was back to baseline. EEG revealed "focal slowing over the right temporo-occipital regionandoccasional epileptiform discharges over the right occipital region". Head CT reviewed, unremarkable for acute intracranial process. MRI brain done revealing mild biparietal signal abnormality suspicious for PRES. [name] recommended starting patient on Keppra BID with repeat MRI in 2 weeks. New onset seizure likely provoked by PRES--if resolved--Ok to take patient off Keppra. Patient with resultant generalized weakness. CIR recommended due to functional deficits. "   Later as outpatient she had a PET scan on 05/14/17 which showed a hypermetabolic mediastinal mass which was subsequently biopsied with a bronchoscopy on 05/19/17. She notes that she had smoked for about 35-40 years, half a pack of a day. She reports that she has not had any other symptoms related to her smoking; she has never had to be on home oxygen or inhalers. She has recently begun Symbicort. While being in the hospital she lost about 20 lbs but has maintained her weight since being discharged.  The pt and her husband note that she is concerned about what to do if she is not able to go back to work after her 90-day leave. We recommended that they  speak with our social workers here to explore the available options for insurance coverage /disability application etc.  Of note prior to the patient's visit today, pt has had PET/CT completed on  05/14/17 with results revealing 1. The known mediastinal mass is intensely hypermetabolic, worrisome for malignancy. This could reflect lymphoma or small cell lung cancer. Tissue sampling recommended.2. No peripheral lung mass identified. New patchy ground-glass opacity in the lingula is mildly hypermetabolic and likely inflammatory.  On 05/19/17 the pt had a Fine Needle Aspiration which revealed Non-Small cell lung carcinoma.  Most recent lab results (05/19/17) of CBC  is as follows: all values are WNL except for RBC at 2.90, Hgb at 8.6, HCT at 28.0, RDW at 16.4.  On review of systems, pt reports knee pain, and denies abdominal pains, leg swelling, SOB, CP, and any other symptoms.   On PMHx the pt reports acute blood loss anemia, GI hemorrhage with melena, hypercholesteremia, HTN, persistent atrial fibrillation with rapid ventricular response, Vitamin D deficiency, denies liver and kidney problems. On Social Hx the pt reports having smoked for 35-40 years of half a pack per day. She quit smoking on 03/31/17.  On Family Hx the pt reports that her father had heart disease.  Interval History:   Kayla Wyatt returns today regarding her Non-Small Cell Carcinoma. The patient's last visit with [place] was on 11/16/17. She is accompanied today by her husband. The pt reports that she is doing well overall.   The pt reports that she has continued to return to her pre-treatment baseline. She notes that her energy levels have been fair and are improving, she has continued eating well and has been moving her bowels well also.   Of note since the patient's last visit, pt has had a PET/CT completed on 12/24/17 with results revealing Marked interval response to therapy with large AP window lymph nodes seen on the previous study almost imperceptible on today's exam. Hypermetabolism within this lesion has resolved with FDG accumulation now at background blood pool levels. 2. Small focus of hypermetabolism in the anal  region, similar to prior and indeterminate by PET imaging. 3. Hypermetabolic focus at the distal tongue has decreased in the Interval. 4. Marked pelvic floor laxity with cystocele. 5. Stable tiny left lung nodules. 6.  Aortic Atherosclerois. 7.  Emphysema.  Lab results today (12/28/17) of CBC w/diff is as follows: all values are WNL except for RBC at 3.50, HGB at 11.6, HCT at 34.8. 12/28/17 CMP is pending 12/28/17 Magnesium is pending  On review of systems, pt reports eating well, fair energy levels, moving her bowels well, weight gain, and denies abdominal pains, back pains, leg swelling, and any other symptoms.     MEDICAL HISTORY:  Past Medical History:  Diagnosis Date  . Acute blood loss anemia 04/02/2017  . Anxiety   . Gastrointestinal hemorrhage with melena   . Headache   . Hypercholesteremia   . Hypertension   . Persistent atrial fibrillation with rapid ventricular response 04/07/2017  . Seizures ([place])    04/2017 while in hospital at [facility]  . Vitamin D deficiency     SURGICAL HISTORY: Past Surgical History:  Procedure Laterality Date  . COLONOSCOPY Left 04/09/2017   Procedure: COLONOSCOPY;  Surgeon: Carol Ada, MD;  Location: [place];  Service: Endoscopy;  Laterality: Left;  . ESOPHAGOGASTRODUODENOSCOPY N/A 04/03/2017   Procedure: ESOPHAGOGASTRODUODENOSCOPY (EGD);  Surgeon: Carol Ada, MD;  Location: [place];  Service: Endoscopy;  Laterality: N/A;  . ESOPHAGOGASTRODUODENOSCOPY (EGD) WITH  PROPOFOL N/A 04/12/2017   Procedure: ESOPHAGOGASTRODUODENOSCOPY (EGD) WITH PROPOFOL;  Surgeon: Milus Banister, MD;  Location: [facility] ENDOSCOPY;  Service: Endoscopy;  Laterality: N/A;  . IR ANGIOGRAM SELECTIVE EACH ADDITIONAL VESSEL  04/12/2017  . IR ANGIOGRAM SELECTIVE EACH ADDITIONAL VESSEL  04/13/2017  . IR ANGIOGRAM SELECTIVE EACH ADDITIONAL VESSEL  04/13/2017  . IR ANGIOGRAM SELECTIVE EACH ADDITIONAL VESSEL  04/13/2017  . IR ANGIOGRAM VISCERAL SELECTIVE  04/12/2017  . IR ANGIOGRAM  VISCERAL SELECTIVE  04/13/2017  . IR EMBO ART  VEN HEMORR LYMPH EXTRAV  INC GUIDE ROADMAPPING  04/12/2017  . IR EMBO ART  VEN HEMORR LYMPH EXTRAV  INC GUIDE ROADMAPPING  04/13/2017  . IR FLUORO GUIDE CV LINE RIGHT  04/13/2017  . IR FLUORO GUIDE PORT INSERTION RIGHT  07/27/2017  . IR IVC FILTER PLMT / S&I /IMG GUID/MOD SED  04/12/2017  . IR US GUIDE VASC ACCESS RIGHT  04/12/2017  . IR US GUIDE VASC ACCESS RIGHT  04/13/2017  . IR US GUIDE VASC ACCESS RIGHT  04/13/2017  . IR US GUIDE VASC ACCESS RIGHT  07/27/2017  . LEG SURGERY  1974   Blood Clot Removal   . VIDEO BRONCHOSCOPY WITH ENDOBRONCHIAL ULTRASOUND N/A 05/19/2017   Procedure: VIDEO BRONCHOSCOPY WITH ENDOBRONCHIAL ULTRASOUND;  Surgeon: Grace Isaac, MD;  Location: [place];  Service: Thoracic;  Laterality: N/A;    SOCIAL HISTORY: Social History   Socioeconomic History  . Marital status: Single    Spouse name: Not on file  . Number of children: Not on file  . Years of education: Not on file  . Highest education level: Not on file  Occupational History  . Occupation: Has to lift heavy boxes at times.    Employer: [employer]  Social Needs  . Financial resource strain: Not on file  . Food insecurity:    Worry: Not on file    Inability: Not on file  . Transportation needs:    Medical: Not on file    Non-medical: Not on file  Tobacco Use  . Smoking status: Former Smoker    Packs/day: 0.50    Years: 40.00    Pack years: 20.00    Types: Cigarettes    Last attempt to quit: 03/31/2017    Years since quitting: 0.7  . Smokeless tobacco: Never Used  Substance and Sexual Activity  . Alcohol use: No    Frequency: Never  . Drug use: No  . Sexual activity: Not Currently  Lifestyle  . Physical activity:    Days per week: Not on file    Minutes per session: Not on file  . Stress: Not on file  Relationships  . Social connections:    Talks on phone: Not on file    Gets together: Not on file    Attends religious service: Not on file    Active  member of club or organization: Not on file    Attends meetings of clubs or organizations: Not on file    Relationship status: Not on file  . Intimate partner violence:    Fear of current or ex partner: No    Emotionally abused: No    Physically abused: No    Forced sexual activity: No  Other Topics Concern  . Not on file  Social History Narrative  . Not on file    FAMILY HISTORY: Family History  Problem Relation Age of Onset  . Heart disease Father     ALLERGIES:  has No Known Allergies.  MEDICATIONS:  Current Outpatient Medications  Medication Sig Dispense Refill  . acetaminophen (TYLENOL) 325 MG tablet Take 1-2 tablets (325-650 mg total) by mouth every 4 (four) hours as needed for mild pain.    . budesonide-formoterol (SYMBICORT) 80-4.5 MCG/ACT inhaler Inhale 2 puffs into the lungs 2 (two) times daily. 1 Inhaler 12  . cholecalciferol (VITAMIN D) 1000 units tablet Take 1,000 Units by mouth 2 (two) times daily.    Kayla Wyatt dexamethasone (DECADRON) 4 MG tablet Take 2 tablets (8 mg total) by mouth daily. Start the day after chemotherapy for 2 days. (Patient not taking: Reported on 11/30/2017) 30 tablet 1  . levETIRAcetam (KEPPRA) 500 MG tablet TK 1 T PO BID  6  . lidocaine-prilocaine (EMLA) cream Apply to affected area once 30 g 3  . LORazepam (ATIVAN) 0.5 MG tablet Take 2 tablets (1 mg total) by mouth once as needed for up to 1 dose (prior to PET/CT and MRI brain for anxiety). (Patient not taking: Reported on 11/30/2017) 6 tablet 0  . ondansetron (ZOFRAN) 8 MG tablet Take 1 tablet (8 mg total) by mouth 2 (two) times daily as needed for refractory nausea / vomiting. Start on day 3 after chemo. (Patient not taking: Reported on 11/30/2017) 30 tablet 1  . pantoprazole (PROTONIX) 40 MG tablet TK 1 T PO BID  6  . polycarbophil (FIBERCON) 625 MG tablet Take 2 tablets (1,250 mg total) by mouth 2 (two) times daily. 120 tablet 0  . potassium chloride SA (K-DUR,KLOR-CON) 20 MEQ tablet Take 1 tablet (20  mEq total) by mouth 2 (two) times daily. 60 tablet 0  . prochlorperazine (COMPAZINE) 10 MG tablet Take 1 tablet (10 mg total) by mouth every 6 (six) hours as needed (Nausea or vomiting). (Patient not taking: Reported on 11/30/2017) 30 tablet 1  . saccharomyces boulardii (FLORASTOR) 250 MG capsule Take 1 capsule (250 mg total) by mouth 2 (two) times daily. 100 capsule 0  . sucralfate (CARAFATE) 1 g tablet Crush and mix in 10-15 mL of water prior to swallowing qAC and HS 120 tablet 1   No current facility-administered medications for this visit.     REVIEW OF SYSTEMS:    A 10+ POINT REVIEW OF SYSTEMS WAS OBTAINED including neurology, dermatology, psychiatry, cardiac, respiratory, lymph, extremities, GI, GU, Musculoskeletal, constitutional, breasts, reproductive, HEENT.  All pertinent positives are noted in the HPI.  All others are negative.   PHYSICAL EXAMINATION: ECOG PERFORMANCE STATUS: 1 - Symptomatic but completely ambulatory  VS reviewed in EPIC  GENERAL:alert, in no acute distress and comfortable SKIN: no acute rashes, no significant lesions EYES: conjunctiva are pink and non-injected, sclera anicteric OROPHARYNX: MMM, no exudates, no oropharyngeal erythema or ulceration NECK: supple, no JVD LYMPH:  no palpable lymphadenopathy in the cervical, axillary or inguinal regions LUNGS: clear to auscultation b/l with normal respiratory effort HEART: regular rate & rhythm ABDOMEN:  normoactive bowel sounds , non tender, not distended. No palpable hepatosplenomegaly.  Extremity: no pedal edema PSYCH: alert & oriented x 3 with fluent speech NEURO: no focal motor/sensory deficits   LABORATORY DATA:  I have reviewed the data as listed  . CBC Latest Ref Rng & Units 12/28/2017 11/16/2017 10/15/2017  WBC 4.0 - 10.5 K/uL 4.7 6.6 2.5(L)  Hemoglobin 12.0 - 15.0 g/dL 11.6(L) 11.4(L) 9.5(L)  Hematocrit 36.0 - 46.0 % 34.8(L) 34.3(L) 28.2(L)  Platelets 150 - 400 K/uL 228 194 74(L)    . CMP Latest  Ref Rng & Units 12/28/2017 11/16/2017 10/15/2017  Glucose  70 - 99 mg/dL 100(H) 102(H) 107(H)  BUN 8 - 23 mg/dL _0 Creatinine 0.44 - 1.00 mg/dL 0.70 0.76 0.68  Sodium 135 - 145 mmol/L 143 146(H) 144  Potassium 3.5 - 5.1 mmol/L 4.5 4.1 3.7  Chloride 98 - 111 mmol/L 108 110 109  CO2 22 - 32 mmol/L _1 Calcium 8.9 - 10.3 mg/dL 10.5(H) 10.0 9.5  Total Protein 6.5 - 8.1 g/dL 7.5 7.0 6.5  Total Bilirubin 0.3 - 1.2 mg/dL 0.3 0.3 0.3  Alkaline Phos 38 - 126 U/L 80 57 64  AST 15 - 41 U/L 15 15 13(L)  ALT 0 - 44 U/L _2 Component     Latest Ref Rng & Units 12/28/2017  Magnesium     1.7 - 2.4 mg/dL 1.6 (L)   05/19/17 Fine Needle Aspiration:  05/19/17 Bronchial Washing Specimen B:   RADIOGRAPHIC STUDIES: I have personally reviewed the radiological images as listed and agreed with the findings in the report. Nm Pet Image Restag (ps) Skull Base To Thigh  Result Date: 12/25/2017 CLINICAL DATA:  Subsequent treatment strategy for non-small cell right lung cancer. EXAM: NUCLEAR MEDICINE PET SKULL BASE TO THIGH TECHNIQUE: 6.4 mCi F-18 FDG was injected intravenously. Full-ring PET imaging was performed from the skull base to thigh after the radiotracer. CT data was obtained and used for attenuation correction and anatomic localization. Fasting blood glucose: 88 mg/dl COMPARISON:  08/19/2017 FINDINGS: Mediastinal blood pool activity: SUV max 2.2 NECK: I take again noted in the distal aspect of the tongue with SUV max = 8.6 today compared to 16.5 previously. Otherwise no unexpected or suspicious hypermetabolism in the neck. Incidental CT findings: None. CHEST: Marked interval decrease in size and hypermetabolism of the AP window lesion. This measures 1.8 x 1.2 cm today compared to 4.4 x 3.2 cm previously. FDG accumulation in this lesion today's it blood pool levels ( SUV max = 2.7) which compares to 11 0.0 previously. No new hypermetabolic disease in the thorax. Incidental CT findings: Right  Port-A-Cath tip is positioned at the SVC/RA junction. Coronary artery calcification is evident. Atherosclerotic calcification is noted in the wall of the thoracic aorta. Centrilobular and paraseptal emphysema noted in the lungs without suspicious pulmonary nodule or mass. 3 mm left upper lobe nodule (24/8) is unchanged. 2 mm nodule posterior left lower lobe (48/8) also stable. ABDOMEN/PELVIS: No abnormal hypermetabolic activity within the liver, pancreas, adrenal glands, or spleen. No hypermetabolic lymph nodes in the abdomen or pelvis. Similar focus of uptake identified in the region of the anus. Incidental CT findings: IVC filter visualized in situ. There is abdominal aortic atherosclerosis without aneurysm. Left colonic diverticulosis without diverticulitis. Prominent pelvic floor laxity with cystocele evident. SKELETON: No focal hypermetabolic activity to suggest skeletal metastasis. Incidental CT findings: none IMPRESSION: 1. Marked interval response to therapy with large AP window lymph nodes seen on the previous study almost imperceptible on today's exam. Hypermetabolism within this lesion has resolved with FDG accumulation now at background blood pool levels. 2. Small focus of hypermetabolism in the anal region, similar to prior and indeterminate by PET imaging. 3. Hypermetabolic focus at the distal tongue has decreased in the interval. 4. Marked pelvic floor laxity with cystocele. 5. Stable tiny left lung nodules. 6.  Aortic Atherosclerois (ICD10-170.0) 7.  Emphysema. (BBC48-G89.9) Electronically Signed   By: Misty Stanley M.D.   On: 12/25/2017 10:03   MRI brain 04/21/2017: IMPRESSION: 1. Motion degraded examination. 2. Mild biparietal  signal abnormality suspicious for posterior reversible encephalopathic syndrome. 3. Otherwise negative MRI of the head with and without contrast for age.  Electronically Signed   By: Elon Alas M.D.   On: 04/22/2017 00:34    ASSESSMENT & PLAN:   62  y.o. female with  1. Recently diagnosed Non-Small Cell Carcinoma on bronchoscopy/cytology.  Not enough tissue for further characterization or mutation testing. Presenting with mediastinal nodal mass - TxN2 Mx (Atleast Stage III disease) ? Lingular primary vs inflammation. No overt evidence of metastatic disease.  -EGFR mutation studies on blood negative. Patient is s/p 3 cycles of Carboplatin/taxol -resolution left upper lobe ground-glass opacity has resolved in the interval. 08/19/17 PET revealed  No significant change in size of hypermetabolic AP window mass. The degree of FDG uptake is slightly increased in the interval. 2. Persistent hypermetabolic focus involving the distal aspect of the tongue within SUV max of 16.46. Similar to previous exam. (no clinical co-relate). Lung involvement has decreased.   08/19/17 Brain MRI revealed No evidence of intracranial metastases. Interval resolution of bilateral parietal signal abnormality favoring PRES  Completed concurrent Carbo/taxol chemotherapy + RT  2. Bone Pain from neulasta - managed with Tylenol  3. Recent SZ -currently on Keppra. MRI brain - resolution of findings of PRES  4. rt calf DVT - off anticoagulation due to massive GI bleeding. S/p IVC filter.  5. S/p Recent GI bleeding from Gastric ulcer --needing clipping and arterial embolization. hgb normal at 10.4  today. On PPI BID  6. Low Magnesium Mg 1.6 -On oral magnesium 223m BID  7. H/o ctx /steroid related thrush --now resolved.   PLAN:   -Discussed pt labwork today, 12/28/17; blood counts continue to improve with HGB up to 11.6, normal WBC and normal PLT -Discussed the 12/24/17 PET/CT which revealed Marked interval response to therapy with large AP window lymph nodes seen on the previous study almost imperceptible on today's exam. Hypermetabolism within this lesion has resolved with FDG accumulation now at background blood pool levels. 2. Small focus of hypermetabolism in the  anal region, similar to prior and indeterminate by PET imaging. 3. Hypermetabolic focus at the distal tongue has decreased in the Interval. 4. Marked pelvic floor laxity with cystocele. 5. Stable tiny left lung nodules. 6.  Aortic Atherosclerois. 7.  Emphysema.  -Discussed the option to pursue maintenance Durvalumab every 2 weeks for one year, the intent of this treatment, and possible side effects. Answered many questions that the pt and husband had and provided supplemental information. The pt will consider this further in the interim.  -Recommended that the pt stay up to date with annual flu vaccine, both 5-year pneumonia vaccines, and Shingrix  -Recommended that the pt continue to eat well, drink at least 48-64 oz of water each day, and walk 20-30 minutes each day. -Will continue with port flushes every 2 months -Will see the pt back in 2 months, sooner if new concerns, or sooner if the pt decides to pursue maintenance Durvalumab    -Patient will decide on whether to proceed with maintenance Durvalumab immunotherapy -RTC with Dr KIrene Limbowith labs and port a cath flush in 21month-port flush q2m84month  All of the patients questions were answered with apparent satisfaction. The patient knows to call the clinic with any problems, questions or concerns.  The total time spent in the appt was 30 minutes and more than 50% was on counseling and direct patient cares.   GauSullivan Lone Kayla AndersonHIVMS [facility]  [facility] Hematology/Oncology Physician [facility]  (Office):       [phone] (Work cell):  [phone] (Fax):           [phone]  12/28/2017 11:35 AM  I, Baldwin Jamaica, am acting as a scribe for Dr. Irene Limbo  .I have reviewed the above documentation for accuracy and completeness, and I agree with the above. Brunetta Genera MD

## 2017-12-28 ENCOUNTER — Encounter: Payer: Self-pay | Admitting: Hematology

## 2017-12-28 ENCOUNTER — Inpatient Hospital Stay: Payer: BLUE CROSS/BLUE SHIELD

## 2017-12-28 ENCOUNTER — Inpatient Hospital Stay: Payer: BLUE CROSS/BLUE SHIELD | Attending: Hematology | Admitting: Hematology

## 2017-12-28 VITALS — BP 156/61 | HR 77 | Temp 97.5°F | Resp 19 | Ht 63.0 in | Wt 131.1 lb

## 2017-12-28 DIAGNOSIS — Z87891 Personal history of nicotine dependence: Secondary | ICD-10-CM | POA: Insufficient documentation

## 2017-12-28 DIAGNOSIS — C3490 Malignant neoplasm of unspecified part of unspecified bronchus or lung: Secondary | ICD-10-CM | POA: Insufficient documentation

## 2017-12-28 DIAGNOSIS — I824Z1 Acute embolism and thrombosis of unspecified deep veins of right distal lower extremity: Secondary | ICD-10-CM | POA: Diagnosis not present

## 2017-12-28 DIAGNOSIS — C3491 Malignant neoplasm of unspecified part of right bronchus or lung: Secondary | ICD-10-CM

## 2017-12-28 DIAGNOSIS — M898X9 Other specified disorders of bone, unspecified site: Secondary | ICD-10-CM | POA: Insufficient documentation

## 2017-12-28 DIAGNOSIS — Z95828 Presence of other vascular implants and grafts: Secondary | ICD-10-CM

## 2017-12-28 LAB — CMP (CANCER CENTER ONLY)
ALT: 9 U/L (ref 0–44)
AST: 15 U/L (ref 15–41)
Albumin: 3.9 g/dL (ref 3.5–5.0)
Alkaline Phosphatase: 80 U/L (ref 38–126)
Anion gap: 9 (ref 5–15)
BUN: 13 mg/dL (ref 8–23)
CO2: 26 mmol/L (ref 22–32)
Calcium: 10.5 mg/dL — ABNORMAL HIGH (ref 8.9–10.3)
Chloride: 108 mmol/L (ref 98–111)
Creatinine: 0.7 mg/dL (ref 0.44–1.00)
GFR, Est AFR Am: >60 mL/min (ref >60)
GFR, Estimated: >60 mL/min (ref >60)
Glucose, Bld: 100 mg/dL — ABNORMAL HIGH (ref 70–99)
Potassium: 4.5 mmol/L (ref 3.5–5.1)
Sodium: 143 mmol/L (ref 135–145)
Total Bilirubin: 0.3 mg/dL (ref 0.3–1.2)
Total Protein: 7.5 g/dL (ref 6.5–8.1)

## 2017-12-28 LAB — CBC WITH DIFFERENTIAL/PLATELET
Abs Immature Granulocytes: 0.01 10*3/uL (ref 0.00–0.07)
Basophils Absolute: 0 10*3/uL (ref 0.0–0.1)
Basophils Relative: 1 %
Eosinophils Absolute: 0.1 10*3/uL (ref 0.0–0.5)
Eosinophils Relative: 3 %
HCT: 34.8 % — ABNORMAL LOW (ref 36.0–46.0)
Hemoglobin: 11.6 g/dL — ABNORMAL LOW (ref 12.0–15.0)
Immature Granulocytes: 0 %
Lymphocytes Relative: 24 %
Lymphs Abs: 1.1 10*3/uL (ref 0.7–4.0)
MCH: 33.1 pg (ref 26.0–34.0)
MCHC: 33.3 g/dL (ref 30.0–36.0)
MCV: 99.4 fL (ref 80.0–100.0)
Monocytes Absolute: 0.5 10*3/uL (ref 0.1–1.0)
Monocytes Relative: 10 %
Neutro Abs: 2.9 10*3/uL (ref 1.7–7.7)
Neutrophils Relative %: 62 %
Platelets: 228 10*3/uL (ref 150–400)
RBC: 3.5 MIL/uL — ABNORMAL LOW (ref 3.87–5.11)
RDW: 12.9 % (ref 11.5–15.5)
WBC: 4.7 10*3/uL (ref 4.0–10.5)
nRBC: 0 % (ref 0.0–0.2)

## 2017-12-28 LAB — MAGNESIUM: Magnesium: 1.6 mg/dL — ABNORMAL LOW (ref 1.7–2.4)

## 2017-12-28 MED ORDER — SODIUM CHLORIDE 0.9% FLUSH
10.0000 mL | INTRAVENOUS | Status: DC | PRN
  Administered 2017-12-28: 10 mL
  Filled 2017-12-28: qty 10

## 2017-12-28 MED ORDER — HEPARIN SOD (PORK) LOCK FLUSH 100 UNIT/ML IV SOLN
500.0000 [IU] | Freq: Once | INTRAVENOUS | Status: AC | PRN
  Administered 2017-12-28: 500 [IU]
  Filled 2017-12-28: qty 5

## 2017-12-29 ENCOUNTER — Telehealth: Payer: Self-pay

## 2017-12-29 NOTE — Telephone Encounter (Signed)
Spoke with patient concerning upcoming appointment. Per 10/21 los. Mailed a letter with a calender enclosed of these new appointments.

## 2018-01-05 ENCOUNTER — Telehealth: Payer: Self-pay | Admitting: *Deleted

## 2018-01-05 NOTE — Telephone Encounter (Signed)
Pt called regarding new treatment discussed with Dr. Irene Limbo.  Pt apprehensive due to information given to her about Imfinzi from Dr. Irene Limbo.  Pt stated "there are a lot of side effects, and I don't know if I should take this."  Explained to pt that there are in fact many side effects to any medication, and it is a requirement for the drug companies to list any potential side effect.  Explained to pt that immunotherapies are in fact less toxic than chemotherapies, and there are usually fewer side effects.  Pt verbalized understanding, and stated she will call us back in the next week to let us know what she decides.

## 2018-01-14 ENCOUNTER — Telehealth: Payer: Self-pay | Admitting: *Deleted

## 2018-01-14 NOTE — Telephone Encounter (Signed)
Contacted office to state she wants to start treatment as described in office visit 10/21. Next scheduled appt is 12/7. Advised patient to contact scheduling for earlier appt if one is available. Patient verbalized understanding.  Notified MD of patient decision.

## 2018-01-18 ENCOUNTER — Other Ambulatory Visit: Payer: Self-pay | Admitting: Hematology

## 2018-01-18 DIAGNOSIS — C3490 Malignant neoplasm of unspecified part of unspecified bronchus or lung: Secondary | ICD-10-CM | POA: Insufficient documentation

## 2018-01-18 DIAGNOSIS — C383 Malignant neoplasm of mediastinum, part unspecified: Secondary | ICD-10-CM

## 2018-01-18 MED ORDER — LIDOCAINE-PRILOCAINE 2.5-2.5 % EX CREA: TOPICAL_CREAM | CUTANEOUS | 3 refills | Status: AC

## 2018-01-18 NOTE — Progress Notes (Signed)
DISCONTINUE ON PATHWAY REGIMEN - Non-Small Cell Lung     Administer weekly:     Paclitaxel      Carboplatin   **Always confirm dose/schedule in your pharmacy ordering system**  REASON: Continuation Of Treatment PRIOR TREATMENT: [id number]: Carboplatin AUC=2 + Paclitaxel 45 mg/m2 Weekly During Radiation TREATMENT RESPONSE: N/A - Adjuvant Therapy  START ON PATHWAY REGIMEN - Non-Small Cell Lung     A cycle is every 14 days:     Durvalumab   **Always confirm dose/schedule in your pharmacy ordering system**  Patient Characteristics: Stage III - Unresectable, PS = 0, 1 AJCC T Category: TX Current Disease Status: No Distant Mets or Local Recurrence AJCC N Category: NX AJCC M Category: M0 AJCC 8 Stage Grouping: IIIB Performance Status: PS = 0, 1 Intent of Therapy: Curative Intent, Discussed with Patient

## 2018-01-20 ENCOUNTER — Telehealth: Payer: Self-pay | Admitting: Hematology

## 2018-01-20 NOTE — Telephone Encounter (Signed)
Called regarding 11/18

## 2018-01-22 ENCOUNTER — Other Ambulatory Visit: Payer: Self-pay | Admitting: *Deleted

## 2018-01-22 ENCOUNTER — Telehealth: Payer: Self-pay | Admitting: Hematology

## 2018-01-22 DIAGNOSIS — C3490 Malignant neoplasm of unspecified part of unspecified bronchus or lung: Secondary | ICD-10-CM

## 2018-01-22 NOTE — Telephone Encounter (Signed)
Called pt regarding appts added per 11/11 sch message

## 2018-01-25 ENCOUNTER — Inpatient Hospital Stay: Payer: BLUE CROSS/BLUE SHIELD | Attending: Hematology

## 2018-01-25 ENCOUNTER — Encounter: Payer: Self-pay | Admitting: Hematology

## 2018-01-25 DIAGNOSIS — C3490 Malignant neoplasm of unspecified part of unspecified bronchus or lung: Secondary | ICD-10-CM | POA: Insufficient documentation

## 2018-01-25 DIAGNOSIS — Z5112 Encounter for antineoplastic immunotherapy: Secondary | ICD-10-CM | POA: Insufficient documentation

## 2018-01-25 DIAGNOSIS — Z79899 Other long term (current) drug therapy: Secondary | ICD-10-CM | POA: Insufficient documentation

## 2018-01-25 NOTE — Progress Notes (Signed)
Met with patient and brother to get clarification on insurance coverage. Patient currently has BCBS through 03/09/18. On 02/07/18, her Medicare begins which she will have [employer] and BC[employer]. In January, she will only have HTA.  Patient received a gas card today from [employer].  Had patient to sign application for copay assistance for Imfinzi. Left form on physician's desk to sign and return back to me.   They have my card for any additional financial questions or concerns.

## 2018-01-25 NOTE — Progress Notes (Signed)
[name]    HEMATOLOGY/ONCOLOGY CLINIC NOTE  Date of Service: 01/26/2018   Patient Care Team: [name], MD as PCP - General (Family Medicine)  CHIEF COMPLAINTS/PURPOSE OF CONSULTATION:  F/u for continued management of recently diagnosed Non-Small Cell lung Carcinoma  HISTORY OF PRESENTING ILLNESS:   Kayla Wyatt is a wonderful 62 y.o. female who has been referred to [place] by Dr [name] for evaluation and management of Non-Small Cell Carcinoma. She is accompanied today by her husband. The pt reports that she is doing well overall.   Patient recently had a significant hospitalization on 04/02/2017 - as per DC summary by [name] " with abdominal pain, hematemesis, melena and ABLA due to GIB.  She underwent clipping of gastric ulcer and received multiple units PRBC but continued to have drop in H/H with bloody stool. NM GI scan was negative for bleeding and she underwent colonoscopy by Dr. [name] 1/31 showing diverticulosis but no signs of bleeding. Hospital course significant for R- gastrocnemius DVT, PAF, incidental findings of mediastinal mass as well as brief episode of hematuria. She was cleared to start on Xarelto on 02/01 for treatment of DVT. On 2/2 am she developed hematemesis with maroon stools and hypotension requiring fluid bolus as well as 2 units PRBCs. She continued to decline requiring intubation, pressors as well as reversal of anticoagulation with Kcentra on 02/3. She underwent UGI revealing large clot with fresh blood in stomach and underwent mesenteric arteriogram with percutaneous coil embolization fo distal tributary of left gastric artery and placement of IVC filter by interventional radiology.   A fib with RVR felt to be due to hemorrhagic shock and she converted to NSR. [name] felt no further cardiac work up indicated and did not recommend initiating anticoagulation in short term. To follow up with [name] after PET scan and routine biopsy after medically  stable. She continued to have bleeding and underwent visceral angiogram with embolization of inferior division of splenic artery and associated gastric arteries on 2/4. No surgical intervention needed per [name] with recommendations to continue monitoring H/H as well as for recurrence of hematochezia. She tolerated extubation on 2/6 and respiratory status improving.  On 2/11, she has episode of unresponsiveness with jerking of bilateral limbs that lasted about 3 minutes. She had amnesia of events but was back to baseline. EEG revealed "focal slowing over the right temporo-occipital regionandoccasional epileptiform discharges over the right occipital region". Head CT reviewed, unremarkable for acute intracranial process. MRI brain done revealing mild biparietal signal abnormality suspicious for PRES. [name] recommended starting patient on Keppra BID with repeat MRI in 2 weeks. New onset seizure likely provoked by PRES--if resolved--Ok to take patient off Keppra. Patient with resultant generalized weakness. CIR recommended due to functional deficits. "   Later as outpatient she had a PET scan on 05/14/17 which showed a hypermetabolic mediastinal mass which was subsequently biopsied with a bronchoscopy on 05/19/17. She notes that she had smoked for about 35-40 years, half a pack of a day. She reports that she has not had any other symptoms related to her smoking; she has never had to be on home oxygen or inhalers. She has recently begun Symbicort. While being in the hospital she lost about 20 lbs but has maintained her weight since being discharged.  The pt and her husband note that she is concerned about what to do if she is not able to go back to work after her 90-day leave. We recommended that they  speak with our social workers here to explore the available options for insurance coverage /disability application etc.  Of note prior to the patient's visit today, pt has had PET/CT completed on  05/14/17 with results revealing 1. The known mediastinal mass is intensely hypermetabolic, worrisome for malignancy. This could reflect lymphoma or small cell lung cancer. Tissue sampling recommended.2. No peripheral lung mass identified. New patchy ground-glass opacity in the lingula is mildly hypermetabolic and likely inflammatory.  On 05/19/17 the pt had a Fine Needle Aspiration which revealed Non-Small cell lung carcinoma.  Most recent lab results (05/19/17) of CBC  is as follows: all values are WNL except for RBC at 2.90, Hgb at 8.6, HCT at 28.0, RDW at 16.4.  On review of systems, pt reports knee pain, and denies abdominal pains, leg swelling, SOB, CP, and any other symptoms.   On PMHx the pt reports acute blood loss anemia, GI hemorrhage with melena, hypercholesteremia, HTN, persistent atrial fibrillation with rapid ventricular response, Vitamin D deficiency, denies liver and kidney problems. On Social Hx the pt reports having smoked for 35-40 years of half a pack per day. She quit smoking on 03/31/17.  On Family Hx the pt reports that her father had heart disease.  Interval History:   Kayla Wyatt returns today regarding her Non-Small Cell Carcinoma. The patient's last visit with [place] was on 12/28/17. She is accompanied today by her husband. The pt reports that she is doing well overall.   The pt reports that she has not had any new concerns in the interim and has been eating well and has gained weight. She notes that she has enjoyed breathing without difficulty and also endorses good energy levels.   In the interim, the pt reviewed previously provided information regarding Durvalumab and chose to begin maintenance Duvalumab every two weeks.   Lab results today (01/25/18) of CBC w/diff, CMP is as follows: all values are WNL except for RBC at 3.61, HGB at 11.6, Sodium at 146, Glucose at 107. 01/26/18 Magnesium at 1.6 01/26/18 TSH is normal at 0.862  On review of systems, pt reports  breathing well, eating well, weight gain, moving her bowels well, and denies blood in the stools, leg swelling, skin rashes, abdominal pains, and any other symptoms.   MEDICAL HISTORY:  Past Medical History:  Diagnosis Date  . Acute blood loss anemia 04/02/2017  . Anxiety   . Gastrointestinal hemorrhage with melena   . Headache   . Hypercholesteremia   . Hypertension   . Persistent atrial fibrillation with rapid ventricular response 04/07/2017  . Seizures ([place])    04/2017 while in hospital at [facility]  . Vitamin D deficiency     SURGICAL HISTORY: Past Surgical History:  Procedure Laterality Date  . COLONOSCOPY Left 04/09/2017   Procedure: COLONOSCOPY;  Surgeon: Carol Ada, MD;  Location: [facility];  Service: Endoscopy;  Laterality: Left;  . ESOPHAGOGASTRODUODENOSCOPY N/A 04/03/2017   Procedure: ESOPHAGOGASTRODUODENOSCOPY (EGD);  Surgeon: Carol Ada, MD;  Location: [facility];  Service: Endoscopy;  Laterality: N/A;  . ESOPHAGOGASTRODUODENOSCOPY (EGD) WITH PROPOFOL N/A 04/12/2017   Procedure: ESOPHAGOGASTRODUODENOSCOPY (EGD) WITH PROPOFOL;  Surgeon: Milus Banister, MD;  Location[facility];  Service: Endoscopy;  Laterality: N/A;  . IR ANGIOGRAM SELECTIVE EACH ADDITIONAL VESSEL  04/12/2017  . IR ANGIOGRAM SELECTIVE EACH ADDITIONAL VESSEL  04/13/2017  . IR ANGIOGRAM SELECTIVE EACH ADDITIONAL VESSEL  04/13/2017  . IR ANGIOGRAM SELECTIVE EACH ADDITIONAL VESSEL  04/13/2017  . IR ANGIOGRAM VISCERAL SELECTIVE  04/12/2017  .  IR ANGIOGRAM VISCERAL SELECTIVE  04/13/2017  . IR EMBO ART  VEN HEMORR LYMPH EXTRAV  INC GUIDE ROADMAPPING  04/12/2017  . IR EMBO ART  VEN HEMORR LYMPH EXTRAV  INC GUIDE ROADMAPPING  04/13/2017  . IR FLUORO GUIDE CV LINE RIGHT  04/13/2017  . IR FLUORO GUIDE PORT INSERTION RIGHT  07/27/2017  . IR IVC FILTER PLMT / S&I /IMG GUID/MOD SED  04/12/2017  . IR US GUIDE VASC ACCESS RIGHT  04/12/2017  . IR US GUIDE VASC ACCESS RIGHT  04/13/2017  . IR US GUIDE VASC ACCESS RIGHT  04/13/2017  . IR US  GUIDE VASC ACCESS RIGHT  07/27/2017  . LEG SURGERY  1974   Blood Clot Removal   . VIDEO BRONCHOSCOPY WITH ENDOBRONCHIAL ULTRASOUND N/A 05/19/2017   Procedure: VIDEO BRONCHOSCOPY WITH ENDOBRONCHIAL ULTRASOUND;  Surgeon: Grace Isaac, MD;  Location: [place];  Service: Thoracic;  Laterality: N/A;    SOCIAL HISTORY: Social History   Socioeconomic History  . Marital status: Single    Spouse name: Not on file  . Number of children: Not on file  . Years of education: Not on file  . Highest education level: Not on file  Occupational History  . Occupation: Has to lift heavy boxes at times.    Employer: [employer]  Social Needs  . Financial resource strain: Not on file  . Food insecurity:    Worry: Not on file    Inability: Not on file  . Transportation needs:    Medical: Not on file    Non-medical: Not on file  Tobacco Use  . Smoking status: Former Smoker    Packs/day: 0.50    Years: 40.00    Pack years: 20.00    Types: Cigarettes    Last attempt to quit: 03/31/2017    Years since quitting: 0.8  . Smokeless tobacco: Never Used  Substance and Sexual Activity  . Alcohol use: No    Frequency: Never  . Drug use: No  . Sexual activity: Not Currently  Lifestyle  . Physical activity:    Days per week: Not on file    Minutes per session: Not on file  . Stress: Not on file  Relationships  . Social connections:    Talks on phone: Not on file    Gets together: Not on file    Attends religious service: Not on file    Active member of club or organization: Not on file    Attends meetings of clubs or organizations: Not on file    Relationship status: Not on file  . Intimate partner violence:    Fear of current or ex partner: No    Emotionally abused: No    Physically abused: No    Forced sexual activity: No  Other Topics Concern  . Not on file  Social History Narrative  . Not on file    FAMILY HISTORY: Family History  Problem Relation Age of Onset  . Heart disease Father       ALLERGIES:  has No Known Allergies.  MEDICATIONS:  Current Outpatient Medications  Medication Sig Dispense Refill  . acetaminophen (TYLENOL) 325 MG tablet Take 1-2 tablets (325-650 mg total) by mouth every 4 (four) hours as needed for mild pain.    . budesonide-formoterol (SYMBICORT) 80-4.5 MCG/ACT inhaler Inhale 2 puffs into the lungs 2 (two) times daily. 1 Inhaler 12  . cholecalciferol (VITAMIN D) 1000 units tablet Take 1,000 Units by mouth 2 (two) times daily.    [name]  dexamethasone (DECADRON) 4 MG tablet Take 2 tablets (8 mg total) by mouth daily. Start the day after chemotherapy for 2 days. (Patient not taking: Reported on 11/30/2017) 30 tablet 1  . levETIRAcetam (KEPPRA) 500 MG tablet TK 1 T PO BID  6  . lidocaine-prilocaine (EMLA) cream Apply to affected area once 30 g 3  . LORazepam (ATIVAN) 0.5 MG tablet Take 2 tablets (1 mg total) by mouth once as needed for up to 1 dose (prior to PET/CT and MRI brain for anxiety). (Patient not taking: Reported on 11/30/2017) 6 tablet 0  . ondansetron (ZOFRAN) 8 MG tablet Take 1 tablet (8 mg total) by mouth 2 (two) times daily as needed for refractory nausea / vomiting. Start on day 3 after chemo. (Patient not taking: Reported on 11/30/2017) 30 tablet 1  . pantoprazole (PROTONIX) 40 MG tablet TK 1 T PO BID  6  . polycarbophil (FIBERCON) 625 MG tablet Take 2 tablets (1,250 mg total) by mouth 2 (two) times daily. 120 tablet 0  . potassium chloride SA (K-DUR,KLOR-CON) 20 MEQ tablet Take 1 tablet (20 mEq total) by mouth 2 (two) times daily. 60 tablet 0  . prochlorperazine (COMPAZINE) 10 MG tablet Take 1 tablet (10 mg total) by mouth every 6 (six) hours as needed (Nausea or vomiting). (Patient not taking: Reported on 11/30/2017) 30 tablet 1  . saccharomyces boulardii (FLORASTOR) 250 MG capsule Take 1 capsule (250 mg total) by mouth 2 (two) times daily. 100 capsule 0  . sucralfate (CARAFATE) 1 g tablet Crush and mix in 10-15 mL of water prior to swallowing qAC  and HS 120 tablet 1   No current facility-administered medications for this visit.     REVIEW OF SYSTEMS:    A 10+ POINT REVIEW OF SYSTEMS WAS OBTAINED including neurology, dermatology, psychiatry, cardiac, respiratory, lymph, extremities, GI, GU, Musculoskeletal, constitutional, breasts, reproductive, HEENT.  All pertinent positives are noted in the HPI.  All others are negative.   PHYSICAL EXAMINATION: ECOG PERFORMANCE STATUS: 1 - Symptomatic but completely ambulatory  VS reviewed in EPIC  GENERAL:alert, in no acute distress and comfortable SKIN: no acute rashes, no significant lesions EYES: conjunctiva are pink and non-injected, sclera anicteric OROPHARYNX: MMM, no exudates, no oropharyngeal erythema or ulceration NECK: supple, no JVD LYMPH:  no palpable lymphadenopathy in the cervical, axillary or inguinal regions LUNGS: clear to auscultation b/l with normal respiratory effort HEART: regular rate & rhythm ABDOMEN:  normoactive bowel sounds , non tender, not distended. No palpable hepatosplenomegaly.  Extremity: no pedal edema PSYCH: alert & oriented x 3 with fluent speech NEURO: no focal motor/sensory deficits   LABORATORY DATA:  I have reviewed the data as listed  . CBC Latest Ref Rng & Units 01/26/2018 12/28/2017 11/16/2017  WBC 4.0 - 10.5 K/uL 4.5 4.7 6.6  Hemoglobin 12.0 - 15.0 g/dL 11.6(L) 11.6(L) 11.4(L)  Hematocrit 36.0 - 46.0 % 36.1 34.8(L) 34.3(L)  Platelets 150 - 400 K/uL 177 228 194    . CMP Latest Ref Rng & Units 01/26/2018 12/28/2017 11/16/2017  Glucose 70 - 99 mg/dL 107(H) 100(H) 102(H)  BUN 8 - 23 mg/dL '14 13 11  '$ Creatinine 0.44 - 1.00 mg/dL 0.69 0.70 0.76  Sodium 135 - 145 mmol/L 146(H) 143 146(H)  Potassium 3.5 - 5.1 mmol/L 4.0 4.5 4.1  Chloride 98 - 111 mmol/L 111 108 110  CO2 22 - 32 mmol/L '26 26 27  '$ Calcium 8.9 - 10.3 mg/dL 9.6 10.5(H) 10.0  Total Protein 6.5 - 8.1 g/dL 6.8  7.5 7.0  Total Bilirubin 0.3 - 1.2 mg/dL 0.3 0.3 0.3  Alkaline Phos 38 -  126 U/L 76 80 57  AST 15 - 41 U/L '16 15 15  '$ ALT 0 - 44 U/L '13 9 8   '$ Component     Latest Ref Rng & Units 12/28/2017  Magnesium     1.7 - 2.4 mg/dL 1.6 (L)   05/19/17 Fine Needle Aspiration:  05/19/17 Bronchial Washing Specimen B:   RADIOGRAPHIC STUDIES: I have personally reviewed the radiological images as listed and agreed with the findings in the report. No results found. MRI brain 04/21/2017: IMPRESSION: 1. Motion degraded examination. 2. 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 3. Otherwise negative MRI of the head with and without contrast for age.  Electronically Signed   By: Elon Alas M.D.   On: 04/22/2017 00:34    ASSESSMENT & PLAN:   62 y.o. female with  1. Recently diagnosed Non-Small Cell Carcinoma on bronchoscopy/cytology.  Not enough tissue for further characterization or mutation testing. Presenting with mediastinal nodal mass - TxN2 Mx (Atleast Stage III disease) ? Lingular primary vs inflammation. No overt evidence of metastatic disease.  -EGFR mutation studies on blood negative. Patient is s/p 3 cycles of Carboplatin/taxol -resolution left upper lobe ground-glass opacity has resolved in the interval. 08/19/17 PET revealed  No significant change in size of hypermetabolic AP window mass. The degree of FDG uptake is slightly increased in the interval. 2. Persistent hypermetabolic focus involving the distal aspect of the tongue within SUV max of 16.46. Similar to previous exam. (no clinical co-relate). Lung involvement has decreased.   08/19/17 Brain MRI revealed No evidence of intracranial metastases. Interval resolution of bilateral parietal signal abnormality favoring PRES  Completed concurrent Carbo/taxol chemotherapy + RT  12/24/17 PET/CT revealed Marked interval response to therapy with large AP window lymph nodes seen on the previous study almost imperceptible on today's exam. Hypermetabolism  within this lesion has resolved with FDG accumulation now at background blood pool levels. 2. Small focus of hypermetabolism in the anal region, similar to prior and indeterminate by PET imaging. 3. Hypermetabolic focus at the distal tongue has decreased in the Interval. 4. Marked pelvic floor laxity with cystocele. 5. Stable tiny left lung nodules. 6.  Aortic Atherosclerois. 7.  Emphysema.   2. Bone Pain from neulasta - managed with Tylenol  3. Recent SZ -currently on Keppra. MRI brain - resolution of findings of PRES  4. rt calf DVT - off anticoagulation due to massive GI bleeding. S/p IVC filter.  5. S/p Recent GI bleeding from Gastric ulcer --needing clipping and arterial embolization. hgb normal at 10.4  today. On PPI BID  6. Low Magnesium Mg 1.6 -On oral magnesium '200mg'$  BID  7. H/o ctx /steroid related thrush --now resolved.   PLAN:  -Recommended that the pt stay up to date with annual flu vaccine, both 5-year pneumonia vaccines, and Shingrix  -Will continue with port flushes every 2 months -Discussed pt labwork today, 01/25/18; Magnesium borderline low at 1.6, blood counts and chemistries are otherwise stable. TSH is normal -Will begin PO Magnesium replacement -Will complete CT Chest in 4 months  -The pt has no prohibitive toxicities from beginning maintenance Durvalumab every two weeks at this time.   -Recommended that the pt continue to eat well, drink at least 48-64 oz of water each day, and walk 20-30 minutes each day.  -Will see the pt back in 2 weeks to ensure tolerance to  Durvalumab    Please schedule Durvalumab q2weeks = x 4 doses Labs and port flush with each treatment RTC with Dr Irene Limbo with next dose in 2 weeks with labs for toxicity check   All of the patients questions were answered with apparent satisfaction. The patient knows to call the clinic with any problems, questions or concerns.  The total time spent in the appt was 25 minutes and more than 50% was on  counseling and direct patient cares.   Sullivan Lone MD Kayla AAHIVMS [facility] Hematology/Oncology Physician [facility]  (Office):       [phone] (Work cell):  [phone] (Fax):           [phone]  01/26/2018 10:02 AM  I, Baldwin Jamaica, am acting as a scribe for Dr. Sullivan Lone.   .I have reviewed the above documentation for accuracy and completeness, and I agree with the above. Brunetta Genera MD

## 2018-01-26 ENCOUNTER — Inpatient Hospital Stay: Payer: BLUE CROSS/BLUE SHIELD

## 2018-01-26 ENCOUNTER — Encounter: Payer: Self-pay | Admitting: Hematology

## 2018-01-26 ENCOUNTER — Inpatient Hospital Stay (HOSPITAL_BASED_OUTPATIENT_CLINIC_OR_DEPARTMENT_OTHER): Payer: BLUE CROSS/BLUE SHIELD | Admitting: Hematology

## 2018-01-26 ENCOUNTER — Telehealth: Payer: Self-pay

## 2018-01-26 VITALS — BP 160/58 | HR 93 | Temp 97.5°F | Resp 21 | Ht 63.0 in | Wt 138.5 lb

## 2018-01-26 DIAGNOSIS — C3490 Malignant neoplasm of unspecified part of unspecified bronchus or lung: Secondary | ICD-10-CM

## 2018-01-26 DIAGNOSIS — I824Z1 Acute embolism and thrombosis of unspecified deep veins of right distal lower extremity: Secondary | ICD-10-CM | POA: Diagnosis not present

## 2018-01-26 DIAGNOSIS — C383 Malignant neoplasm of mediastinum, part unspecified: Secondary | ICD-10-CM

## 2018-01-26 DIAGNOSIS — M898X9 Other specified disorders of bone, unspecified site: Secondary | ICD-10-CM

## 2018-01-26 DIAGNOSIS — Z87891 Personal history of nicotine dependence: Secondary | ICD-10-CM

## 2018-01-26 DIAGNOSIS — Z5111 Encounter for antineoplastic chemotherapy: Secondary | ICD-10-CM

## 2018-01-26 DIAGNOSIS — Z95828 Presence of other vascular implants and grafts: Secondary | ICD-10-CM

## 2018-01-26 DIAGNOSIS — Z5112 Encounter for antineoplastic immunotherapy: Secondary | ICD-10-CM | POA: Diagnosis present

## 2018-01-26 DIAGNOSIS — C3491 Malignant neoplasm of unspecified part of right bronchus or lung: Secondary | ICD-10-CM

## 2018-01-26 DIAGNOSIS — Z79899 Other long term (current) drug therapy: Secondary | ICD-10-CM | POA: Diagnosis not present

## 2018-01-26 LAB — CMP (CANCER CENTER ONLY)
ALT: 13 U/L (ref 0–44)
AST: 16 U/L (ref 15–41)
Albumin: 3.6 g/dL (ref 3.5–5.0)
Alkaline Phosphatase: 76 U/L (ref 38–126)
Anion gap: 9 (ref 5–15)
BUN: 14 mg/dL (ref 8–23)
CO2: 26 mmol/L (ref 22–32)
Calcium: 9.6 mg/dL (ref 8.9–10.3)
Chloride: 111 mmol/L (ref 98–111)
Creatinine: 0.69 mg/dL (ref 0.44–1.00)
GFR, Est AFR Am: >60 mL/min (ref >60)
GFR, Estimated: >60 mL/min (ref >60)
Glucose, Bld: 107 mg/dL — ABNORMAL HIGH (ref 70–99)
Potassium: 4 mmol/L (ref 3.5–5.1)
Sodium: 146 mmol/L — ABNORMAL HIGH (ref 135–145)
Total Bilirubin: 0.3 mg/dL (ref 0.3–1.2)
Total Protein: 6.8 g/dL (ref 6.5–8.1)

## 2018-01-26 LAB — CBC WITH DIFFERENTIAL (CANCER CENTER ONLY)
Abs Immature Granulocytes: 0.02 10*3/uL (ref 0.00–0.07)
Basophils Absolute: 0 10*3/uL (ref 0.0–0.1)
Basophils Relative: 1 %
Eosinophils Absolute: 0.1 10*3/uL (ref 0.0–0.5)
Eosinophils Relative: 2 %
HCT: 36.1 % (ref 36.0–46.0)
Hemoglobin: 11.6 g/dL — ABNORMAL LOW (ref 12.0–15.0)
Immature Granulocytes: 0 %
Lymphocytes Relative: 23 %
Lymphs Abs: 1 10*3/uL (ref 0.7–4.0)
MCH: 32.1 pg (ref 26.0–34.0)
MCHC: 32.1 g/dL (ref 30.0–36.0)
MCV: 100 fL (ref 80.0–100.0)
Monocytes Absolute: 0.4 10*3/uL (ref 0.1–1.0)
Monocytes Relative: 10 %
Neutro Abs: 2.9 10*3/uL (ref 1.7–7.7)
Neutrophils Relative %: 64 %
Platelet Count: 177 10*3/uL (ref 150–400)
RBC: 3.61 MIL/uL — ABNORMAL LOW (ref 3.87–5.11)
RDW: 13.4 % (ref 11.5–15.5)
WBC Count: 4.5 10*3/uL (ref 4.0–10.5)
nRBC: 0 % (ref 0.0–0.2)

## 2018-01-26 LAB — TSH: TSH: 0.862 u[IU]/mL (ref 0.308–3.960)

## 2018-01-26 LAB — MAGNESIUM: Magnesium: 1.6 mg/dL — ABNORMAL LOW (ref 1.7–2.4)

## 2018-01-26 MED ORDER — SODIUM CHLORIDE 0.9 % IV SOLN
Freq: Once | INTRAVENOUS | Status: AC
  Administered 2018-01-26: 10:00:00 via INTRAVENOUS
  Filled 2018-01-26: qty 250

## 2018-01-26 MED ORDER — SODIUM CHLORIDE 0.9% FLUSH
10.0000 mL | INTRAVENOUS | Status: DC | PRN
  Administered 2018-01-26: 10 mL
  Filled 2018-01-26: qty 10

## 2018-01-26 MED ORDER — HEPARIN SOD (PORK) LOCK FLUSH 100 UNIT/ML IV SOLN
500.0000 [IU] | Freq: Once | INTRAVENOUS | Status: AC | PRN
  Administered 2018-01-26: 500 [IU]
  Filled 2018-01-26: qty 5

## 2018-01-26 MED ORDER — SODIUM CHLORIDE 0.9 % IV SOLN
620.0000 mg | Freq: Once | INTRAVENOUS | Status: AC
  Administered 2018-01-26: 620 mg via INTRAVENOUS
  Filled 2018-01-26: qty 10

## 2018-01-26 NOTE — Telephone Encounter (Signed)
Printed avs and calender of upcoming appointment. Per 11/19 los

## 2018-01-26 NOTE — Patient Instructions (Signed)
Thank you for choosing [facility] to provide your oncology and hematology care.  To afford each patient quality time with our providers, please arrive 30 minutes before your scheduled appointment time.  If you arrive late for your appointment, you may be asked to reschedule.  We strive to give you quality time with our providers, and arriving late affects you and other patients whose appointments are after yours.    If you are a no show for multiple scheduled visits, you may be dismissed from the clinic at the providers discretion.     Again, thank you for choosing [facility], our hope is that these requests will decrease the amount of time that you wait before being seen by our physicians.  ______________________________________________________________________   Should you have questions after your visit to the [facility], please contact our office at [phone] between the hours of 8:30 and 4:30 p.m.    Voicemails left after 4:30p.m will not be returned until the following business day.     For prescription refill requests, please have your pharmacy contact us directly.  Please also try to allow 48 hours for prescription requests.     Please contact the scheduling department for questions regarding scheduling.  For scheduling of procedures such as PET scans, CT scans, MRI, Ultrasound, etc please contact central scheduling at [phone].     Resources For Cancer Patients and Caregivers:    Oncolink.org:  A wonderful resource for patients and healthcare providers for information regarding your disease, ways to tract your treatment, what to expect, etc.      American Cancer Society:  800-227-2345  Can help patients locate various types of support and financial assistance   Cancer Care: 1-800-813-HOPE (4673) Provides financial assistance, online support groups, medication/co-pay assistance.     [place] DSS:  336-641-3447 Where to apply  for food stamps, Medicaid, and utility assistance   Medicare Rights Center: 800-333-4114 Helps people with Medicare understand their rights and benefits, navigate the Medicare system, and secure the quality healthcare they deserve   SCAT: 336-333-6589 [place] [employer]'s shared-ride transportation service for eligible riders who have a disability that prevents them from riding the fixed route bus.     For additional information on assistance programs please contact our social worker:   Abigail Elmore:  [phone]  

## 2018-01-26 NOTE — Patient Instructions (Signed)
[facility] Discharge Instructions for Patients Receiving Chemotherapy  Today you received the following chemotherapy agentsDurvalumab  To help prevent nausea and vomiting after your treatment, we encourage you to take your nausea medication.   If you develop nausea and vomiting that is not controlled by your nausea medication, call the clinic.   BELOW ARE SYMPTOMS THAT SHOULD BE REPORTED IMMEDIATELY:  *FEVER GREATER THAN 100.5 F  *CHILLS WITH OR WITHOUT FEVER  NAUSEA AND VOMITING THAT IS NOT CONTROLLED WITH YOUR NAUSEA MEDICATION  *UNUSUAL SHORTNESS OF BREATH  *UNUSUAL BRUISING OR BLEEDING  TENDERNESS IN MOUTH AND THROAT WITH OR WITHOUT PRESENCE OF ULCERS  *URINARY PROBLEMS  *BOWEL PROBLEMS  UNUSUAL RASH Items with * indicate a potential emergency and should be followed up as soon as possible.  Feel free to call the clinic should you have any questions or concerns. The clinic phone number is [phone].  Please show the [facility] at check-in to the Emergency Department and triage nurse.

## 2018-01-27 ENCOUNTER — Encounter: Payer: Self-pay | Admitting: Hematology

## 2018-01-27 NOTE — Progress Notes (Signed)
Received call from [place] w/ [place] assistance confirming application information.  Patient successfully enrolled to up to $26,000 per year for Imfinizi plus $100 Administration cost per infusion and can be back-billed up to 120 days. She will fax a copy of the approval letter as well as send one to the patient and load on portal for [facility] to have access to submit claims.

## 2018-02-08 NOTE — Progress Notes (Signed)
[name]    HEMATOLOGY/ONCOLOGY CLINIC NOTE  Date of Service: 02/09/2018   Patient Care Team: [name], MD as PCP - General (Family Medicine)  CHIEF COMPLAINTS/PURPOSE OF CONSULTATION:  F/u for continued management of recently diagnosed Non-Small Cell lung Carcinoma  HISTORY OF PRESENTING ILLNESS:   Kayla Wyatt is a wonderful 62 y.o. female who has been referred to [place] by Dr [name] for evaluation and management of Non-Small Cell Carcinoma. She is accompanied today by her husband. The pt reports that she is doing well overall.   Patient recently had a significant hospitalization on 04/02/2017 - as per DC summary by [name] " with abdominal pain, hematemesis, melena and ABLA due to GIB.  She underwent clipping of gastric ulcer and received multiple units PRBC but continued to have drop in H/H with bloody stool. NM GI scan was negative for bleeding and she underwent colonoscopy by Dr. [name] 1/31 showing diverticulosis but no signs of bleeding. Hospital course significant for R- gastrocnemius DVT, PAF, incidental findings of mediastinal mass as well as brief episode of hematuria. She was cleared to start on Xarelto on 02/01 for treatment of DVT. On 2/2 am she developed hematemesis with maroon stools and hypotension requiring fluid bolus as well as 2 units PRBCs. She continued to decline requiring intubation, pressors as well as reversal of anticoagulation with Kcentra on 02/3. She underwent UGI revealing large clot with fresh blood in stomach and underwent mesenteric arteriogram with percutaneous coil embolization fo distal tributary of left gastric artery and placement of IVC filter by interventional radiology.   A fib with RVR felt to be due to hemorrhagic shock and she converted to NSR. [name] felt no further cardiac work up indicated and did not recommend initiating anticoagulation in short term. To follow up with [name] after PET scan and routine biopsy after medically  stable. She continued to have bleeding and underwent visceral angiogram with embolization of inferior division of splenic artery and associated gastric arteries on 2/4. No surgical intervention needed per [name] with recommendations to continue monitoring H/H as well as for recurrence of hematochezia. She tolerated extubation on 2/6 and respiratory status improving.  On 2/11, she has episode of unresponsiveness with jerking of bilateral limbs that lasted about 3 minutes. She had amnesia of events but was back to baseline. EEG revealed "focal slowing over the right temporo-occipital regionandoccasional epileptiform discharges over the right occipital region". Head CT reviewed, unremarkable for acute intracranial process. MRI brain done revealing mild biparietal signal abnormality suspicious for PRES. [name] recommended starting patient on Keppra BID with repeat MRI in 2 weeks. New onset seizure likely provoked by PRES--if resolved--Ok to take patient off Keppra. Patient with resultant generalized weakness. CIR recommended due to functional deficits. "   Later as outpatient she had a PET scan on 05/14/17 which showed a hypermetabolic mediastinal mass which was subsequently biopsied with a bronchoscopy on 05/19/17. She notes that she had smoked for about 35-40 years, half a pack of a day. She reports that she has not had any other symptoms related to her smoking; she has never had to be on home oxygen or inhalers. She has recently begun Symbicort. While being in the hospital she lost about 20 lbs but has maintained her weight since being discharged.  The pt and her husband note that she is concerned about what to do if she is not able to go back to work after her 90-day leave. We recommended that they  speak with our social workers here to explore the available options for insurance coverage /disability application etc.  Of note prior to the patient's visit today, pt has had PET/CT completed on  05/14/17 with results revealing 1. The known mediastinal mass is intensely hypermetabolic, worrisome for malignancy. This could reflect lymphoma or small cell lung cancer. Tissue sampling recommended.2. No peripheral lung mass identified. New patchy ground-glass opacity in the lingula is mildly hypermetabolic and likely inflammatory.  On 05/19/17 the pt had a Fine Needle Aspiration which revealed Non-Small cell lung carcinoma.  Most recent lab results (05/19/17) of CBC  is as follows: all values are WNL except for RBC at 2.90, Hgb at 8.6, HCT at 28.0, RDW at 16.4.  On review of systems, pt reports knee pain, and denies abdominal pains, leg swelling, SOB, CP, and any other symptoms.   On PMHx the pt reports acute blood loss anemia, GI hemorrhage with melena, hypercholesteremia, HTN, persistent atrial fibrillation with rapid ventricular response, Vitamin D deficiency, denies liver and kidney problems. On Social Hx the pt reports having smoked for 35-40 years of half a pack per day. She quit smoking on 03/31/17.  On Family Hx the pt reports that her father had heart disease.  Interval History:   Kayla Wyatt returns today regarding her Non-Small Cell Carcinoma. The patient's last visit with [place] was on 01/26/18. She is accompanied today by her husband. The pt reports that she is doing well overall.   The pt reports that she tolerated her first dose of maintenance dose of Durvalumab without difficulty. She denies any mouth sores, skin rashes, diarrhea or new fatigue. She also notes that she has had no difficulty breathing, or any new lung symptoms.   The pt notes that she has been gaining weight and is comfortable with her current weight.   Lab results today (02/09/18) of CBC w/diff is as follows: all values are WNL except for RBC at 3.79. 02/09/18 CMP is pending  02/09/18 Magnesium is pending 02/09/18 TSH is pending   On review of systems, pt reports stable breathing, stable energy levels, eating  well, and denies skin rashes, mouth sores, fatigue, diarrhea, abdominal pains, leg swelling, and any other symptoms.    MEDICAL HISTORY:  Past Medical History:  Diagnosis Date  . Acute blood loss anemia 04/02/2017  . Anxiety   . Gastrointestinal hemorrhage with melena   . Headache   . Hypercholesteremia   . Hypertension   . Persistent atrial fibrillation with rapid ventricular response 04/07/2017  . Seizures ([place])    04/2017 while in hospital at [facility]  . Vitamin D deficiency     SURGICAL HISTORY: Past Surgical History:  Procedure Laterality Date  . COLONOSCOPY Left 04/09/2017   Procedure: COLONOSCOPY;  Surgeon: Carol Ada, MD;  Location: [facility];  Service: Endoscopy;  Laterality: Left;  . ESOPHAGOGASTRODUODENOSCOPY N/A 04/03/2017   Procedure: ESOPHAGOGASTRODUODENOSCOPY (EGD);  Surgeon: Carol Ada, MD;  Location: [facility];  Service: Endoscopy;  Laterality: N/A;  . ESOPHAGOGASTRODUODENOSCOPY (EGD) WITH PROPOFOL N/A 04/12/2017   Procedure: ESOPHAGOGASTRODUODENOSCOPY (EGD) WITH PROPOFOL;  Surgeon: Milus Banister, MD;  Location: [facility] ENDOSCOPY;  Service: Endoscopy;  Laterality: N/A;  . IR ANGIOGRAM SELECTIVE EACH ADDITIONAL VESSEL  04/12/2017  . IR ANGIOGRAM SELECTIVE EACH ADDITIONAL VESSEL  04/13/2017  . IR ANGIOGRAM SELECTIVE EACH ADDITIONAL VESSEL  04/13/2017  . IR ANGIOGRAM SELECTIVE EACH ADDITIONAL VESSEL  04/13/2017  . IR ANGIOGRAM VISCERAL SELECTIVE  04/12/2017  . IR ANGIOGRAM VISCERAL SELECTIVE  04/13/2017  .  IR EMBO ART  VEN HEMORR LYMPH EXTRAV  INC GUIDE ROADMAPPING  04/12/2017  . IR EMBO ART  VEN HEMORR LYMPH EXTRAV  INC GUIDE ROADMAPPING  04/13/2017  . IR FLUORO GUIDE CV LINE RIGHT  04/13/2017  . IR FLUORO GUIDE PORT INSERTION RIGHT  07/27/2017  . IR IVC FILTER PLMT / S&I /IMG GUID/MOD SED  04/12/2017  . IR US GUIDE VASC ACCESS RIGHT  04/12/2017  . IR US GUIDE VASC ACCESS RIGHT  04/13/2017  . IR US GUIDE VASC ACCESS RIGHT  04/13/2017  . IR US GUIDE VASC ACCESS RIGHT  07/27/2017  . LEG  SURGERY  1974   Blood Clot Removal   . VIDEO BRONCHOSCOPY WITH ENDOBRONCHIAL ULTRASOUND N/A 05/19/2017   Procedure: VIDEO BRONCHOSCOPY WITH ENDOBRONCHIAL ULTRASOUND;  Surgeon: Grace Isaac, MD;  Location: [facility];  Service: Thoracic;  Laterality: N/A;    SOCIAL HISTORY: Social History   Socioeconomic History  . Marital status: Single    Spouse name: Not on file  . Number of children: Not on file  . Years of education: Not on file  . Highest education level: Not on file  Occupational History  . Occupation: Has to lift heavy boxes at times.    Employer: [employer]  Social Needs  . Financial resource strain: Not on file  . Food insecurity:    Worry: Not on file    Inability: Not on file  . Transportation needs:    Medical: Not on file    Non-medical: Not on file  Tobacco Use  . Smoking status: Former Smoker    Packs/day: 0.50    Years: 40.00    Pack years: 20.00    Types: Cigarettes    Last attempt to quit: 03/31/2017    Years since quitting: 0.8  . Smokeless tobacco: Never Used  Substance and Sexual Activity  . Alcohol use: No    Frequency: Never  . Drug use: No  . Sexual activity: Not Currently  Lifestyle  . Physical activity:    Days per week: Not on file    Minutes per session: Not on file  . Stress: Not on file  Relationships  . Social connections:    Talks on phone: Not on file    Gets together: Not on file    Attends religious service: Not on file    Active member of club or organization: Not on file    Attends meetings of clubs or organizations: Not on file    Relationship status: Not on file  . Intimate partner violence:    Fear of current or ex partner: No    Emotionally abused: No    Physically abused: No    Forced sexual activity: No  Other Topics Concern  . Not on file  Social History Narrative  . Not on file    FAMILY HISTORY: Family History  Problem Relation Age of Onset  . Heart disease Father     ALLERGIES:  has No Known  Allergies.  MEDICATIONS:  Current Outpatient Medications  Medication Sig Dispense Refill  . acetaminophen (TYLENOL) 325 MG tablet Take 1-2 tablets (325-650 mg total) by mouth every 4 (four) hours as needed for mild pain.    . budesonide-formoterol (SYMBICORT) 80-4.5 MCG/ACT inhaler Inhale 2 puffs into the lungs 2 (two) times daily. 1 Inhaler 12  . cholecalciferol (VITAMIN D) 1000 units tablet Take 1,000 Units by mouth 2 (two) times daily.    [name] levETIRAcetam (KEPPRA) 500 MG tablet TK 1  T PO BID  6  . lidocaine-prilocaine (EMLA) cream Apply to affected area once 30 g 3  . LORazepam (ATIVAN) 0.5 MG tablet Take 2 tablets (1 mg total) by mouth once as needed for up to 1 dose (prior to PET/CT and MRI brain for anxiety). (Patient not taking: Reported on 11/30/2017) 6 tablet 0  . ondansetron (ZOFRAN) 8 MG tablet Take 1 tablet (8 mg total) by mouth 2 (two) times daily as needed for refractory nausea / vomiting. Start on day 3 after chemo. (Patient not taking: Reported on 11/30/2017) 30 tablet 1  . pantoprazole (PROTONIX) 40 MG tablet TK 1 T PO BID  6  . polycarbophil (FIBERCON) 625 MG tablet Take 2 tablets (1,250 mg total) by mouth 2 (two) times daily. 120 tablet 0  . potassium chloride SA (K-DUR,KLOR-CON) 20 MEQ tablet Take 1 tablet (20 mEq total) by mouth 2 (two) times daily. 60 tablet 0  . prochlorperazine (COMPAZINE) 10 MG tablet Take 1 tablet (10 mg total) by mouth every 6 (six) hours as needed (Nausea or vomiting). (Patient not taking: Reported on 11/30/2017) 30 tablet 1  . saccharomyces boulardii (FLORASTOR) 250 MG capsule Take 1 capsule (250 mg total) by mouth 2 (two) times daily. 100 capsule 0  . sucralfate (CARAFATE) 1 g tablet Crush and mix in 10-15 mL of water prior to swallowing qAC and HS 120 tablet 1   No current facility-administered medications for this visit.     REVIEW OF SYSTEMS:    A 10+ POINT REVIEW OF SYSTEMS WAS OBTAINED including neurology, dermatology, psychiatry, cardiac,  respiratory, lymph, extremities, GI, GU, Musculoskeletal, constitutional, breasts, reproductive, HEENT.  All pertinent positives are noted in the HPI.  All others are negative.   PHYSICAL EXAMINATION: ECOG PERFORMANCE STATUS: 1 - Symptomatic but completely ambulatory  VS reviewed in EPIC  GENERAL:alert, in no acute distress and comfortable SKIN: no acute rashes, no significant lesions EYES: conjunctiva are pink and non-injected, sclera anicteric OROPHARYNX: MMM, no exudates, no oropharyngeal erythema or ulceration NECK: supple, no JVD LYMPH:  no palpable lymphadenopathy in the cervical, axillary or inguinal regions LUNGS: clear to auscultation b/l with normal respiratory effort HEART: regular rate & rhythm ABDOMEN:  normoactive bowel sounds , non tender, not distended. No palpable hepatosplenomegaly.  Extremity: no pedal edema PSYCH: alert & oriented x 3 with fluent speech NEURO: no focal motor/sensory deficits   LABORATORY DATA:  I have reviewed the data as listed  . CBC Latest Ref Rng & Units 02/09/2018 01/26/2018 12/28/2017  WBC 4.0 - 10.5 K/uL 5.3 4.5 4.7  Hemoglobin 12.0 - 15.0 g/dL 12.0 11.6(L) 11.6(L)  Hematocrit 36.0 - 46.0 % 36.9 36.1 34.8(L)  Platelets 150 - 400 K/uL 219 177 228    . CMP Latest Ref Rng & Units 01/26/2018 12/28/2017 11/16/2017  Glucose 70 - 99 mg/dL 107(H) 100(H) 102(H)  BUN 8 - 23 mg/dL '14 13 11  '$ Creatinine 0.44 - 1.00 mg/dL 0.69 0.70 0.76  Sodium 135 - 145 mmol/L 146(H) 143 146(H)  Potassium 3.5 - 5.1 mmol/L 4.0 4.5 4.1  Chloride 98 - 111 mmol/L 111 108 110  CO2 22 - 32 mmol/L '26 26 27  '$ Calcium 8.9 - 10.3 mg/dL 9.6 10.5(H) 10.0  Total Protein 6.5 - 8.1 g/dL 6.8 7.5 7.0  Total Bilirubin 0.3 - 1.2 mg/dL 0.3 0.3 0.3  Alkaline Phos 38 - 126 U/L 76 80 57  AST 15 - 41 U/L '16 15 15  '$ ALT 0 - 44 U/L 13 9 8  Component     Latest Ref Rng & Units 12/28/2017  Magnesium     1.7 - 2.4 mg/dL 1.6 (L)   05/19/17 Fine Needle Aspiration:  05/19/17 Bronchial  Washing Specimen B:   RADIOGRAPHIC STUDIES: I have personally reviewed the radiological images as listed and agreed with the findings in the report. No results found. MRI brain 04/21/2017: IMPRESSION: 1. Motion degraded examination. 2. 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 3. Otherwise negative MRI of the head with and without contrast for age.  Electronically Signed   By: Elon Alas M.D.   On: 04/22/2017 00:34    ASSESSMENT & PLAN:   62 y.o. female with  1. Recently diagnosed Non-Small Cell Carcinoma on bronchoscopy/cytology.  Not enough tissue for further characterization or mutation testing. Presenting with mediastinal nodal mass - TxN2 Mx (Atleast Stage III disease) ? Lingular primary vs inflammation. No overt evidence of metastatic disease.  -EGFR mutation studies on blood negative. Patient is s/p 3 cycles of Carboplatin/taxol -resolution left upper lobe ground-glass opacity has resolved in the interval. 08/19/17 PET revealed  No significant change in size of hypermetabolic AP window mass. The degree of FDG uptake is slightly increased in the interval. 2. Persistent hypermetabolic focus involving the distal aspect of the tongue within SUV max of 16.46. Similar to previous exam. (no clinical co-relate). Lung involvement has decreased.   08/19/17 Brain MRI revealed No evidence of intracranial metastases. Interval resolution of bilateral parietal signal abnormality favoring PRES  Completed concurrent Carbo/taxol chemotherapy + RT  12/24/17 PET/CT revealed Marked interval response to therapy with large AP window lymph nodes seen on the previous study almost imperceptible on today's exam. Hypermetabolism within this lesion has resolved with FDG accumulation now at background blood pool levels. 2. Small focus of hypermetabolism in the anal region, similar to prior and indeterminate by PET imaging. 3. Hypermetabolic  focus at the distal tongue has decreased in the Interval. 4. Marked pelvic floor laxity with cystocele. 5. Stable tiny left lung nodules. 6.  Aortic Atherosclerois. 7.  Emphysema.   2. Bone Pain from neulasta - managed with Tylenol  3. Recent SZ -currently on Keppra. MRI brain - resolution of findings of PRES  4. rt calf DVT - off anticoagulation due to massive GI bleeding. S/p IVC filter.  5. S/p Recent GI bleeding from Gastric ulcer --needing clipping and arterial embolization. hgb normal at 10.4  today. On PPI BID  6. Low Magnesium Mg 1.6 -On oral magnesium '200mg'$  BID  7. H/o ctx /steroid related thrush --now resolved.   PLAN:  -Discussed pt labwork today, 02/09/18; anemia resolved with HGB normalized to 12.0, other blood counts are stable  -The pt has no prohibitive toxicities from continuing maintenance Durvalumab every 2 weeks  -Recommended that the pt stay up to date with annual flu vaccine, both 5-year pneumonia vaccines, and Shingrix  -Will continue with port flushes every 2 months -Will begin PO Magnesium replacement -Will complete CT Chest in 4 months  -Will see the pt back in 2 weeks   RTC for next Durvalumab immunotherapy in 2weeks with labs and MD visit    All of the patients questions were answered with apparent satisfaction. The patient knows to call the clinic with any problems, questions or concerns.  The total time spent in the appt was 25 minutes and more than 50% was on counseling and direct patient cares.   Sullivan Lone MD [place] AAHIVMS [facility] Hematology/Oncology Physician [place]  [facility]  (Office):       [phone] (Work cell):  [phone] (Fax):           [phone]  02/09/2018 2:52 PM  I, Baldwin [place], am acting as a scribe for Dr. Sullivan Lone.   .I have reviewed the above documentation for accuracy and completeness, and I agree with the above. Brunetta Genera MD

## 2018-02-09 ENCOUNTER — Inpatient Hospital Stay (HOSPITAL_BASED_OUTPATIENT_CLINIC_OR_DEPARTMENT_OTHER): Payer: BLUE CROSS/BLUE SHIELD | Admitting: Hematology

## 2018-02-09 ENCOUNTER — Inpatient Hospital Stay: Payer: BLUE CROSS/BLUE SHIELD

## 2018-02-09 ENCOUNTER — Inpatient Hospital Stay: Payer: BLUE CROSS/BLUE SHIELD | Attending: Hematology

## 2018-02-09 VITALS — BP 153/73 | HR 75 | Temp 97.7°F | Resp 18 | Ht 63.0 in | Wt 140.5 lb

## 2018-02-09 DIAGNOSIS — M898X9 Other specified disorders of bone, unspecified site: Secondary | ICD-10-CM | POA: Diagnosis not present

## 2018-02-09 DIAGNOSIS — Z5112 Encounter for antineoplastic immunotherapy: Secondary | ICD-10-CM | POA: Insufficient documentation

## 2018-02-09 DIAGNOSIS — E876 Hypokalemia: Secondary | ICD-10-CM

## 2018-02-09 DIAGNOSIS — Z87891 Personal history of nicotine dependence: Secondary | ICD-10-CM

## 2018-02-09 DIAGNOSIS — I824Z1 Acute embolism and thrombosis of unspecified deep veins of right distal lower extremity: Secondary | ICD-10-CM | POA: Diagnosis not present

## 2018-02-09 DIAGNOSIS — Z79899 Other long term (current) drug therapy: Secondary | ICD-10-CM | POA: Diagnosis not present

## 2018-02-09 DIAGNOSIS — C3491 Malignant neoplasm of unspecified part of right bronchus or lung: Secondary | ICD-10-CM

## 2018-02-09 DIAGNOSIS — Z95828 Presence of other vascular implants and grafts: Secondary | ICD-10-CM

## 2018-02-09 DIAGNOSIS — C3490 Malignant neoplasm of unspecified part of unspecified bronchus or lung: Secondary | ICD-10-CM

## 2018-02-09 LAB — CBC WITH DIFFERENTIAL/PLATELET
Abs Immature Granulocytes: 0.01 10*3/uL (ref 0.00–0.07)
Basophils Absolute: 0 10*3/uL (ref 0.0–0.1)
Basophils Relative: 0 %
Eosinophils Absolute: 0.1 10*3/uL (ref 0.0–0.5)
Eosinophils Relative: 2 %
HCT: 36.9 % (ref 36.0–46.0)
Hemoglobin: 12 g/dL (ref 12.0–15.0)
Immature Granulocytes: 0 %
Lymphocytes Relative: 27 %
Lymphs Abs: 1.5 10*3/uL (ref 0.7–4.0)
MCH: 31.7 pg (ref 26.0–34.0)
MCHC: 32.5 g/dL (ref 30.0–36.0)
MCV: 97.4 fL (ref 80.0–100.0)
Monocytes Absolute: 0.5 10*3/uL (ref 0.1–1.0)
Monocytes Relative: 10 %
Neutro Abs: 3.2 10*3/uL (ref 1.7–7.7)
Neutrophils Relative %: 61 %
Platelets: 219 10*3/uL (ref 150–400)
RBC: 3.79 MIL/uL — ABNORMAL LOW (ref 3.87–5.11)
RDW: 13.8 % (ref 11.5–15.5)
WBC: 5.3 10*3/uL (ref 4.0–10.5)
nRBC: 0 % (ref 0.0–0.2)

## 2018-02-09 LAB — CMP (CANCER CENTER ONLY)
ALT: 16 U/L (ref 0–44)
AST: 23 U/L (ref 15–41)
Albumin: 3.8 g/dL (ref 3.5–5.0)
Alkaline Phosphatase: 77 U/L (ref 38–126)
Anion gap: 8 (ref 5–15)
BUN: 17 mg/dL (ref 8–23)
CO2: 27 mmol/L (ref 22–32)
Calcium: 9.9 mg/dL (ref 8.9–10.3)
Chloride: 110 mmol/L (ref 98–111)
Creatinine: 0.68 mg/dL (ref 0.44–1.00)
GFR, Est AFR Am: >60 mL/min (ref >60)
GFR, Estimated: >60 mL/min (ref >60)
Glucose, Bld: 127 mg/dL — ABNORMAL HIGH (ref 70–99)
Potassium: 3.5 mmol/L (ref 3.5–5.1)
Sodium: 145 mmol/L (ref 135–145)
Total Bilirubin: 0.3 mg/dL (ref 0.3–1.2)
Total Protein: 7.1 g/dL (ref 6.5–8.1)

## 2018-02-09 LAB — TSH: TSH: 0.411 u[IU]/mL (ref 0.308–3.960)

## 2018-02-09 LAB — MAGNESIUM: Magnesium: 1.8 mg/dL (ref 1.7–2.4)

## 2018-02-09 MED ORDER — HEPARIN SOD (PORK) LOCK FLUSH 100 UNIT/ML IV SOLN
500.0000 [IU] | Freq: Once | INTRAVENOUS | Status: AC | PRN
  Administered 2018-02-09: 500 [IU]
  Filled 2018-02-09: qty 5

## 2018-02-09 MED ORDER — SODIUM CHLORIDE 0.9% FLUSH
10.0000 mL | INTRAVENOUS | Status: DC | PRN
  Administered 2018-02-09: 10 mL
  Filled 2018-02-09: qty 10

## 2018-02-10 ENCOUNTER — Inpatient Hospital Stay: Payer: BLUE CROSS/BLUE SHIELD

## 2018-02-10 VITALS — BP 152/60 | HR 89 | Temp 97.7°F | Resp 18

## 2018-02-10 DIAGNOSIS — C3490 Malignant neoplasm of unspecified part of unspecified bronchus or lung: Secondary | ICD-10-CM | POA: Diagnosis not present

## 2018-02-10 DIAGNOSIS — C383 Malignant neoplasm of mediastinum, part unspecified: Secondary | ICD-10-CM

## 2018-02-10 MED ORDER — HEPARIN SOD (PORK) LOCK FLUSH 100 UNIT/ML IV SOLN
500.0000 [IU] | Freq: Once | INTRAVENOUS | Status: AC | PRN
  Administered 2018-02-10: 500 [IU]
  Filled 2018-02-10: qty 5

## 2018-02-10 MED ORDER — SODIUM CHLORIDE 0.9 % IV SOLN
Freq: Once | INTRAVENOUS | Status: AC
  Administered 2018-02-10: 09:00:00 via INTRAVENOUS
  Filled 2018-02-10: qty 250

## 2018-02-10 MED ORDER — SODIUM CHLORIDE 0.9 % IV SOLN
10.3000 mg/kg | Freq: Once | INTRAVENOUS | Status: AC
  Administered 2018-02-10: 620 mg via INTRAVENOUS
  Filled 2018-02-10: qty 2.4

## 2018-02-10 MED ORDER — SODIUM CHLORIDE 0.9% FLUSH
10.0000 mL | INTRAVENOUS | Status: DC | PRN
  Administered 2018-02-10: 10 mL
  Filled 2018-02-10: qty 10

## 2018-02-10 NOTE — Patient Instructions (Signed)
[facility] Discharge Instructions for Patients Receiving Chemotherapy  Today you received the following chemotherapy agents: Durvalumab  To help prevent nausea and vomiting after your treatment, we encourage you to take your nausea medication.   If you develop nausea and vomiting that is not controlled by your nausea medication, call the clinic.   BELOW ARE SYMPTOMS THAT SHOULD BE REPORTED IMMEDIATELY:  *FEVER GREATER THAN 100.5 F  *CHILLS WITH OR WITHOUT FEVER  NAUSEA AND VOMITING THAT IS NOT CONTROLLED WITH YOUR NAUSEA MEDICATION  *UNUSUAL SHORTNESS OF BREATH  *UNUSUAL BRUISING OR BLEEDING  TENDERNESS IN MOUTH AND THROAT WITH OR WITHOUT PRESENCE OF ULCERS  *URINARY PROBLEMS  *BOWEL PROBLEMS  UNUSUAL RASH Items with * indicate a potential emergency and should be followed up as soon as possible.  Feel free to call the clinic should you have any questions or concerns. The clinic phone number is [phone].  Please show the [facility] at check-in to the Emergency Department and triage nurse.

## 2018-02-22 NOTE — Progress Notes (Signed)
[name]    HEMATOLOGY/ONCOLOGY CLINIC NOTE  Date of Service: 02/23/2018   Patient Care Team: [name], MD as PCP - General (Family Medicine)  CHIEF COMPLAINTS/PURPOSE OF CONSULTATION:  F/u for continued management of recently diagnosed Non-Small Cell lung Carcinoma  HISTORY OF PRESENTING ILLNESS:   ARABEL BARCENAS is a wonderful 62 y.o. female who has been referred to [place] by Dr [name] for evaluation and management of Non-Small Cell Carcinoma. She is accompanied today by her husband. The pt reports that she is doing well overall.   Patient recently had a significant hospitalization on 04/02/2017 - as per DC summary by [name] " with abdominal pain, hematemesis, melena and ABLA due to GIB.  She underwent clipping of gastric ulcer and received multiple units PRBC but continued to have drop in H/H with bloody stool. NM GI scan was negative for bleeding and she underwent colonoscopy by Dr. [name] 1/31 showing diverticulosis but no signs of bleeding. Hospital course significant for R- gastrocnemius DVT, PAF, incidental findings of mediastinal mass as well as brief episode of hematuria. She was cleared to start on Xarelto on 02/01 for treatment of DVT. On 2/2 am she developed hematemesis with maroon stools and hypotension requiring fluid bolus as well as 2 units PRBCs. She continued to decline requiring intubation, pressors as well as reversal of anticoagulation with Kcentra on 02/3. She underwent UGI revealing large clot with fresh blood in stomach and underwent mesenteric arteriogram with percutaneous coil embolization fo distal tributary of left gastric artery and placement of IVC filter by interventional radiology.   A fib with RVR felt to be due to hemorrhagic shock and she converted to NSR. [name] felt no further cardiac work up indicated and did not recommend initiating anticoagulation in short term. To follow up with [name] after PET scan and routine biopsy after medically  stable. She continued to have bleeding and underwent visceral angiogram with embolization of inferior division of splenic artery and associated gastric arteries on 2/4. No surgical intervention needed per [name] with recommendations to continue monitoring H/H as well as for recurrence of hematochezia. She tolerated extubation on 2/6 and respiratory status improving.  On 2/11, she has episode of unresponsiveness with jerking of bilateral limbs that lasted about 3 minutes. She had amnesia of events but was back to baseline. EEG revealed "focal slowing over the right temporo-occipital regionandoccasional epileptiform discharges over the right occipital region". Head CT reviewed, unremarkable for acute intracranial process. MRI brain done revealing mild biparietal signal abnormality suspicious for PRES. [name] recommended starting patient on Keppra BID with repeat MRI in 2 weeks. New onset seizure likely provoked by PRES--if resolved--Ok to take patient off Keppra. Patient with resultant generalized weakness. CIR recommended due to functional deficits. "   Later as outpatient she had a PET scan on 05/14/17 which showed a hypermetabolic mediastinal mass which was subsequently biopsied with a bronchoscopy on 05/19/17. She notes that she had smoked for about 35-40 years, half a pack of a day. She reports that she has not had any other symptoms related to her smoking; she has never had to be on home oxygen or inhalers. She has recently begun Symbicort. While being in the hospital she lost about 20 lbs but has maintained her weight since being discharged.  The pt and her husband note that she is concerned about what to do if she is not able to go back to work after her 90-day leave. We recommended that they  speak with our social workers here to explore the available options for insurance coverage /disability application etc.  Of note prior to the patient's visit today, pt has had PET/CT completed on  05/14/17 with results revealing 1. The known mediastinal mass is intensely hypermetabolic, worrisome for malignancy. This could reflect lymphoma or small cell lung cancer. Tissue sampling recommended.2. No peripheral lung mass identified. New patchy ground-glass opacity in the lingula is mildly hypermetabolic and likely inflammatory.  On 05/19/17 the pt had a Fine Needle Aspiration which revealed Non-Small cell lung carcinoma.  Most recent lab results (05/19/17) of CBC  is as follows: all values are WNL except for RBC at 2.90, Hgb at 8.6, HCT at 28.0, RDW at 16.4.  On review of systems, pt reports knee pain, and denies abdominal pains, leg swelling, SOB, CP, and any other symptoms.   On PMHx the pt reports acute blood loss anemia, GI hemorrhage with melena, hypercholesteremia, HTN, persistent atrial fibrillation with rapid ventricular response, Vitamin D deficiency, denies liver and kidney problems. On Social Hx the pt reports having smoked for 35-40 years of half a pack per day. She quit smoking on 03/31/17.  On Family Hx the pt reports that her father had heart disease.  Interval History:   Kayla Wyatt returns today regarding her Non-Small Cell Carcinoma. The patient's last visit with [place] was on 02/09/18. She is accompanied today by her husband. The pt reports that she is doing well overall.   The pt reports that she has not developed any new concerns in the brief interim. She denies any difficulty tolerating the Durvalumab including mouth sores and diarrhea. She also denies any fevers, chills, night sweats or concerns for infections. She denies any problems with her port.   Lab results today (02/23/18) of CBC w/diff and CMP is as follows: all values are WNL except for RBC at 3.41, HGB at 10.8, HCT at 33.4, Potassium at 3.3.  On review of systems, pt reports eating well, moving her bowels well, and denies fevers, chills, night sweats, concerns for infections, mouth sores, diarrhea, abdominal  pains, and any other symptoms.   MEDICAL HISTORY:  Past Medical History:  Diagnosis Date  . Acute blood loss anemia 04/02/2017  . Anxiety   . Gastrointestinal hemorrhage with melena   . Headache   . Hypercholesteremia   . Hypertension   . Persistent atrial fibrillation with rapid ventricular response 04/07/2017  . Seizures ([place])    04/2017 while in hospital at [facility]  . Vitamin D deficiency     SURGICAL HISTORY: Past Surgical History:  Procedure Laterality Date  . COLONOSCOPY Left 04/09/2017   Procedure: COLONOSCOPY;  Surgeon: Carol Ada, MD;  Location: [place];  Service: Endoscopy;  Laterality: Left;  . ESOPHAGOGASTRODUODENOSCOPY N/A 04/03/2017   Procedure: ESOPHAGOGASTRODUODENOSCOPY (EGD);  Surgeon: Carol Ada, MD;  Location: [place];  Service: Endoscopy;  Laterality: N/A;  . ESOPHAGOGASTRODUODENOSCOPY (EGD) WITH PROPOFOL N/A 04/12/2017   Procedure: ESOPHAGOGASTRODUODENOSCOPY (EGD) WITH PROPOFOL;  Surgeon: Milus Banister, MD;  Location: [facility] ENDOSCOPY;  Service: Endoscopy;  Laterality: N/A;  . IR ANGIOGRAM SELECTIVE EACH ADDITIONAL VESSEL  04/12/2017  . IR ANGIOGRAM SELECTIVE EACH ADDITIONAL VESSEL  04/13/2017  . IR ANGIOGRAM SELECTIVE EACH ADDITIONAL VESSEL  04/13/2017  . IR ANGIOGRAM SELECTIVE EACH ADDITIONAL VESSEL  04/13/2017  . IR ANGIOGRAM VISCERAL SELECTIVE  04/12/2017  . IR ANGIOGRAM VISCERAL SELECTIVE  04/13/2017  . IR EMBO ART  VEN HEMORR LYMPH EXTRAV  INC GUIDE ROADMAPPING  04/12/2017  .  IR EMBO ART  VEN HEMORR LYMPH EXTRAV  INC GUIDE ROADMAPPING  04/13/2017  . IR FLUORO GUIDE CV LINE RIGHT  04/13/2017  . IR FLUORO GUIDE PORT INSERTION RIGHT  07/27/2017  . IR IVC FILTER PLMT / S&I /IMG GUID/MOD SED  04/12/2017  . IR US GUIDE VASC ACCESS RIGHT  04/12/2017  . IR US GUIDE VASC ACCESS RIGHT  04/13/2017  . IR US GUIDE VASC ACCESS RIGHT  04/13/2017  . IR US GUIDE VASC ACCESS RIGHT  07/27/2017  . LEG SURGERY  1974   Blood Clot Removal   . VIDEO BRONCHOSCOPY WITH ENDOBRONCHIAL ULTRASOUND  N/A 05/19/2017   Procedure: VIDEO BRONCHOSCOPY WITH ENDOBRONCHIAL ULTRASOUND;  Surgeon: Grace Isaac, MD;  Location: [place];  Service: Thoracic;  Laterality: N/A;    SOCIAL HISTORY: Social History   Socioeconomic History  . Marital status: Single    Spouse name: Not on file  . Number of children: Not on file  . Years of education: Not on file  . Highest education level: Not on file  Occupational History  . Occupation: Has to lift heavy boxes at times.    Employer: [employer]  Social Needs  . Financial resource strain: Not on file  . Food insecurity:    Worry: Not on file    Inability: Not on file  . Transportation needs:    Medical: Not on file    Non-medical: Not on file  Tobacco Use  . Smoking status: Former Smoker    Packs/day: 0.50    Years: 40.00    Pack years: 20.00    Types: Cigarettes    Last attempt to quit: 03/31/2017    Years since quitting: 0.9  . Smokeless tobacco: Never Used  Substance and Sexual Activity  . Alcohol use: No    Frequency: Never  . Drug use: No  . Sexual activity: Not Currently  Lifestyle  . Physical activity:    Days per week: Not on file    Minutes per session: Not on file  . Stress: Not on file  Relationships  . Social connections:    Talks on phone: Not on file    Gets together: Not on file    Attends religious service: Not on file    Active member of club or organization: Not on file    Attends meetings of clubs or organizations: Not on file    Relationship status: Not on file  . Intimate partner violence:    Fear of current or ex partner: No    Emotionally abused: No    Physically abused: No    Forced sexual activity: No  Other Topics Concern  . Not on file  Social History Narrative  . Not on file    FAMILY HISTORY: Family History  Problem Relation Age of Onset  . Heart disease Father     ALLERGIES:  has No Known Allergies.  MEDICATIONS:  Current Outpatient Medications  Medication Sig Dispense Refill  .  acetaminophen (TYLENOL) 325 MG tablet Take 1-2 tablets (325-650 mg total) by mouth every 4 (four) hours as needed for mild pain.    . budesonide-formoterol (SYMBICORT) 80-4.5 MCG/ACT inhaler Inhale 2 puffs into the lungs 2 (two) times daily. 1 Inhaler 12  . cholecalciferol (VITAMIN D) 1000 units tablet Take 1,000 Units by mouth 2 (two) times daily.    [name] levETIRAcetam (KEPPRA) 500 MG tablet TK 1 T PO BID  6  . lidocaine-prilocaine (EMLA) cream Apply to affected area once 30  g 3  . LORazepam (ATIVAN) 0.5 MG tablet Take 2 tablets (1 mg total) by mouth once as needed for up to 1 dose (prior to PET/CT and MRI brain for anxiety). (Patient not taking: Reported on 11/30/2017) 6 tablet 0  . ondansetron (ZOFRAN) 8 MG tablet Take 1 tablet (8 mg total) by mouth 2 (two) times daily as needed for refractory nausea / vomiting. Start on day 3 after chemo. (Patient not taking: Reported on 11/30/2017) 30 tablet 1  . pantoprazole (PROTONIX) 40 MG tablet TK 1 T PO BID  6  . polycarbophil (FIBERCON) 625 MG tablet Take 2 tablets (1,250 mg total) by mouth 2 (two) times daily. 120 tablet 0  . potassium chloride SA (K-DUR,KLOR-CON) 20 MEQ tablet Take 1 tablet (20 mEq total) by mouth 2 (two) times daily. 60 tablet 0  . prochlorperazine (COMPAZINE) 10 MG tablet Take 1 tablet (10 mg total) by mouth every 6 (six) hours as needed (Nausea or vomiting). (Patient not taking: Reported on 11/30/2017) 30 tablet 1  . saccharomyces boulardii (FLORASTOR) 250 MG capsule Take 1 capsule (250 mg total) by mouth 2 (two) times daily. 100 capsule 0  . sucralfate (CARAFATE) 1 g tablet Crush and mix in 10-15 mL of water prior to swallowing qAC and HS 120 tablet 1   No current facility-administered medications for this visit.     REVIEW OF SYSTEMS:    A 10+ POINT REVIEW OF SYSTEMS WAS OBTAINED including neurology, dermatology, psychiatry, cardiac, respiratory, lymph, extremities, GI, GU, Musculoskeletal, constitutional, breasts, reproductive,  HEENT.  All pertinent positives are noted in the HPI.  All others are negative.   PHYSICAL EXAMINATION: ECOG PERFORMANCE STATUS: 1 - Symptomatic but completely ambulatory  VS reviewed in EPIC  GENERAL:alert, in no acute distress and comfortable SKIN: no acute rashes, no significant lesions EYES: conjunctiva are pink and non-injected, sclera anicteric OROPHARYNX: MMM, no exudates, no oropharyngeal erythema or ulceration NECK: supple, no JVD LYMPH:  no palpable lymphadenopathy in the cervical, axillary or inguinal regions LUNGS: clear to auscultation b/l with normal respiratory effort HEART: regular rate & rhythm ABDOMEN:  normoactive bowel sounds , non tender, not distended. No palpable hepatosplenomegaly.  Extremity: no pedal edema PSYCH: alert & oriented x 3 with fluent speech NEURO: no focal motor/sensory deficits   LABORATORY DATA:  I have reviewed the data as listed  . CBC Latest Ref Rng & Units 02/23/2018 02/09/2018 01/26/2018  WBC 4.0 - 10.5 K/uL 5.2 5.3 4.5  Hemoglobin 12.0 - 15.0 g/dL 10.8(L) 12.0 11.6(L)  Hematocrit 36.0 - 46.0 % 33.4(L) 36.9 36.1  Platelets 150 - 400 K/uL 228 219 177    . CMP Latest Ref Rng & Units 02/23/2018 02/09/2018 01/26/2018  Glucose 70 - 99 mg/dL 91 127(H) 107(H)  BUN 8 - 23 mg/dL '15 17 14  '$ Creatinine 0.44 - 1.00 mg/dL 0.61 0.68 0.69  Sodium 135 - 145 mmol/L 145 145 146(H)  Potassium 3.5 - 5.1 mmol/L 3.3(L) 3.5 4.0  Chloride 98 - 111 mmol/L 111 110 111  CO2 22 - 32 mmol/L '25 27 26  '$ Calcium 8.9 - 10.3 mg/dL 9.3 9.9 9.6  Total Protein 6.5 - 8.1 g/dL 6.6 7.1 6.8  Total Bilirubin 0.3 - 1.2 mg/dL 0.3 0.3 0.3  Alkaline Phos 38 - 126 U/L 66 77 76  AST 15 - 41 U/L '18 23 16  '$ ALT 0 - 44 U/L '15 16 13   '$ Component     Latest Ref Rng & Units 12/28/2017  Magnesium  1.7 - 2.4 mg/dL 1.6 (L)   05/19/17 Fine Needle Aspiration:  05/19/17 Bronchial Washing Specimen B:   RADIOGRAPHIC STUDIES: I have personally reviewed the radiological images as  listed and agreed with the findings in the report. No results found. MRI brain 04/21/2017: IMPRESSION: 1. Motion degraded examination. 2. 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 3. Otherwise negative MRI of the head with and without contrast for age.  Electronically Signed   By: Elon Alas M.D.   On: 04/22/2017 00:34    ASSESSMENT & PLAN:   62 y.o. female with  1. Recently diagnosed Non-Small Cell Carcinoma on bronchoscopy/cytology.  Not enough tissue for further characterization or mutation testing. Presenting with mediastinal nodal mass - TxN2 Mx (Atleast Stage III disease) ? Lingular primary vs inflammation. No overt evidence of metastatic disease.  -EGFR mutation studies on blood negative. Patient is s/p 3 cycles of Carboplatin/taxol -resolution left upper lobe ground-glass opacity has resolved in the interval. 08/19/17 PET revealed  No significant change in size of hypermetabolic AP window mass. The degree of FDG uptake is slightly increased in the interval. 2. Persistent hypermetabolic focus involving the distal aspect of the tongue within SUV max of 16.46. Similar to previous exam. (no clinical co-relate). Lung involvement has decreased.   08/19/17 Brain MRI revealed No evidence of intracranial metastases. Interval resolution of bilateral parietal signal abnormality favoring PRES  Completed concurrent Carbo/taxol chemotherapy + RT  12/24/17 PET/CT revealed Marked interval response to therapy with large AP window lymph nodes seen on the previous study almost imperceptible on today's exam. Hypermetabolism within this lesion has resolved with FDG accumulation now at background blood pool levels. 2. Small focus of hypermetabolism in the anal region, similar to prior and indeterminate by PET imaging. 3. Hypermetabolic focus at the distal tongue has decreased in the Interval. 4. Marked pelvic floor laxity with cystocele. 5.  Stable tiny left lung nodules. 6.  Aortic Atherosclerois. 7.  Emphysema.   2. Bone Pain from neulasta - managed with Tylenol  3. Previous h/ot SZ -currently on Keppra. MRI brain - resolution of findings of PRES  4. H/o rt calf DVT - off anticoagulation due to massive GI bleeding. S/p IVC filter.  5. S/p previous GI bleeding from Gastric ulcer --needing clipping and arterial embolization. hgb normal at 10.4  today. On PPI BID  6. Low Magnesium Mg 1.6 -On oral magnesium '200mg'$  BID  7. H/o ctx /steroid related thrush --now resolved.   PLAN:  -Discussed pt labwork today, 02/23/18; slightly anemic with HGB at 10.8, mild hyperkalemia with Potassium at 3.3 -The pt has no prohibitive toxicities from continuing maintenance Durvalumab every 2 weeks at this time.  -Recommended that the pt stay up to date with annual flu vaccine, both 5-year pneumonia vaccines, and Shingrix  -Will continue with port flushes every 2 months -Will complete CT Chest in 3 months  -Will see the pt back in 4 weeks    Please schedule next 4 doses of Durvalumab q2weeks with labs RTC with Dr Irene Limbo in 4 weeks   All of the patients questions were answered with apparent satisfaction. The patient knows to call the clinic with any problems, questions or concerns.  The total time spent in the appt was 20 minutes and more than 50% was on counseling and direct patient cares.   Sullivan Lone MD [place] AAHIVMS [facility] Hematology/Oncology Physician [facility]  (Office):       [phone] (Work cell):  [phone] (Fax):           [phone]  02/23/2018 2:38 PM  I, Baldwin [place], am acting as a scribe for Dr. Sullivan Lone.   .I have reviewed the above documentation for accuracy and completeness, and I agree with the above. Brunetta Genera MD

## 2018-02-23 ENCOUNTER — Inpatient Hospital Stay (HOSPITAL_BASED_OUTPATIENT_CLINIC_OR_DEPARTMENT_OTHER): Payer: BLUE CROSS/BLUE SHIELD | Admitting: Hematology

## 2018-02-23 ENCOUNTER — Inpatient Hospital Stay: Payer: BLUE CROSS/BLUE SHIELD

## 2018-02-23 VITALS — BP 162/55 | HR 95 | Temp 97.9°F | Resp 18 | Ht 63.0 in | Wt 144.6 lb

## 2018-02-23 DIAGNOSIS — M898X9 Other specified disorders of bone, unspecified site: Secondary | ICD-10-CM

## 2018-02-23 DIAGNOSIS — C383 Malignant neoplasm of mediastinum, part unspecified: Secondary | ICD-10-CM

## 2018-02-23 DIAGNOSIS — Z87891 Personal history of nicotine dependence: Secondary | ICD-10-CM | POA: Diagnosis not present

## 2018-02-23 DIAGNOSIS — I824Z1 Acute embolism and thrombosis of unspecified deep veins of right distal lower extremity: Secondary | ICD-10-CM | POA: Diagnosis not present

## 2018-02-23 DIAGNOSIS — C3490 Malignant neoplasm of unspecified part of unspecified bronchus or lung: Secondary | ICD-10-CM | POA: Diagnosis not present

## 2018-02-23 DIAGNOSIS — Z5111 Encounter for antineoplastic chemotherapy: Secondary | ICD-10-CM

## 2018-02-23 DIAGNOSIS — Z95828 Presence of other vascular implants and grafts: Secondary | ICD-10-CM

## 2018-02-23 LAB — CMP (CANCER CENTER ONLY)
ALT: 15 U/L (ref 0–44)
AST: 18 U/L (ref 15–41)
Albumin: 3.7 g/dL (ref 3.5–5.0)
Alkaline Phosphatase: 66 U/L (ref 38–126)
Anion gap: 9 (ref 5–15)
BUN: 15 mg/dL (ref 8–23)
CO2: 25 mmol/L (ref 22–32)
Calcium: 9.3 mg/dL (ref 8.9–10.3)
Chloride: 111 mmol/L (ref 98–111)
Creatinine: 0.61 mg/dL (ref 0.44–1.00)
GFR, Est AFR Am: >60 mL/min (ref >60)
GFR, Estimated: >60 mL/min (ref >60)
Glucose, Bld: 91 mg/dL (ref 70–99)
Potassium: 3.3 mmol/L — ABNORMAL LOW (ref 3.5–5.1)
Sodium: 145 mmol/L (ref 135–145)
Total Bilirubin: 0.3 mg/dL (ref 0.3–1.2)
Total Protein: 6.6 g/dL (ref 6.5–8.1)

## 2018-02-23 LAB — CBC WITH DIFFERENTIAL/PLATELET
Abs Immature Granulocytes: 0.01 10*3/uL (ref 0.00–0.07)
Basophils Absolute: 0 10*3/uL (ref 0.0–0.1)
Basophils Relative: 0 %
Eosinophils Absolute: 0.2 10*3/uL (ref 0.0–0.5)
Eosinophils Relative: 3 %
HCT: 33.4 % — ABNORMAL LOW (ref 36.0–46.0)
Hemoglobin: 10.8 g/dL — ABNORMAL LOW (ref 12.0–15.0)
Immature Granulocytes: 0 %
Lymphocytes Relative: 27 %
Lymphs Abs: 1.4 10*3/uL (ref 0.7–4.0)
MCH: 31.7 pg (ref 26.0–34.0)
MCHC: 32.3 g/dL (ref 30.0–36.0)
MCV: 97.9 fL (ref 80.0–100.0)
Monocytes Absolute: 0.5 10*3/uL (ref 0.1–1.0)
Monocytes Relative: 10 %
Neutro Abs: 3.1 10*3/uL (ref 1.7–7.7)
Neutrophils Relative %: 60 %
Platelets: 228 10*3/uL (ref 150–400)
RBC: 3.41 MIL/uL — ABNORMAL LOW (ref 3.87–5.11)
RDW: 14.2 % (ref 11.5–15.5)
WBC: 5.2 10*3/uL (ref 4.0–10.5)
nRBC: 0 % (ref 0.0–0.2)

## 2018-02-23 MED ORDER — HEPARIN SOD (PORK) LOCK FLUSH 100 UNIT/ML IV SOLN
500.0000 [IU] | Freq: Once | INTRAVENOUS | Status: DC | PRN
  Filled 2018-02-23: qty 5

## 2018-02-23 MED ORDER — SODIUM CHLORIDE 0.9% FLUSH
10.0000 mL | INTRAVENOUS | Status: DC | PRN
  Administered 2018-02-23: 10 mL
  Filled 2018-02-23: qty 10

## 2018-02-23 MED ORDER — SODIUM CHLORIDE 0.9% FLUSH
10.0000 mL | INTRAVENOUS | Status: DC | PRN
  Filled 2018-02-23: qty 10

## 2018-02-23 MED ORDER — SODIUM CHLORIDE 0.9 % IV SOLN
Freq: Once | INTRAVENOUS | Status: AC
  Administered 2018-02-23: 16:00:00 via INTRAVENOUS
  Filled 2018-02-23: qty 250

## 2018-02-23 MED ORDER — SODIUM CHLORIDE 0.9 % IV SOLN
620.0000 mg | Freq: Once | INTRAVENOUS | Status: AC
  Administered 2018-02-23: 620 mg via INTRAVENOUS
  Filled 2018-02-23: qty 10

## 2018-02-23 NOTE — Patient Instructions (Signed)
[facility] Discharge Instructions for Patients Receiving Chemotherapy  Today you received the following chemotherapy agents: Durvalumab  To help prevent nausea and vomiting after your treatment, we encourage you to take your nausea medication.   If you develop nausea and vomiting that is not controlled by your nausea medication, call the clinic.   BELOW ARE SYMPTOMS THAT SHOULD BE REPORTED IMMEDIATELY:  *FEVER GREATER THAN 100.5 F  *CHILLS WITH OR WITHOUT FEVER  NAUSEA AND VOMITING THAT IS NOT CONTROLLED WITH YOUR NAUSEA MEDICATION  *UNUSUAL SHORTNESS OF BREATH  *UNUSUAL BRUISING OR BLEEDING  TENDERNESS IN MOUTH AND THROAT WITH OR WITHOUT PRESENCE OF ULCERS  *URINARY PROBLEMS  *BOWEL PROBLEMS  UNUSUAL RASH Items with * indicate a potential emergency and should be followed up as soon as possible.  Feel free to call the clinic should you have any questions or concerns. The clinic phone number is [phone].  Please show the [place] at check-in to the Emergency Department and triage nurse.

## 2018-02-26 ENCOUNTER — Telehealth: Payer: Self-pay

## 2018-02-26 NOTE — Telephone Encounter (Signed)
Tried calling patient concerning changes to her current schedule due to [place] wanting to see her in 2 weeks. Per 12/17 los mailed a letter with a calender enclosed of her new and added appointments.

## 2018-03-09 ENCOUNTER — Inpatient Hospital Stay: Payer: BLUE CROSS/BLUE SHIELD

## 2018-03-09 DIAGNOSIS — C3490 Malignant neoplasm of unspecified part of unspecified bronchus or lung: Secondary | ICD-10-CM | POA: Diagnosis not present

## 2018-03-09 DIAGNOSIS — Z95828 Presence of other vascular implants and grafts: Secondary | ICD-10-CM

## 2018-03-09 DIAGNOSIS — C383 Malignant neoplasm of mediastinum, part unspecified: Secondary | ICD-10-CM

## 2018-03-09 DIAGNOSIS — C3491 Malignant neoplasm of unspecified part of right bronchus or lung: Secondary | ICD-10-CM

## 2018-03-09 LAB — CBC WITH DIFFERENTIAL/PLATELET
Abs Immature Granulocytes: 0.02 10*3/uL (ref 0.00–0.07)
Basophils Absolute: 0 10*3/uL (ref 0.0–0.1)
Basophils Relative: 0 %
Eosinophils Absolute: 0.1 10*3/uL (ref 0.0–0.5)
Eosinophils Relative: 2 %
HCT: 35.5 % — ABNORMAL LOW (ref 36.0–46.0)
Hemoglobin: 11.7 g/dL — ABNORMAL LOW (ref 12.0–15.0)
Immature Granulocytes: 0 %
Lymphocytes Relative: 26 %
Lymphs Abs: 1.5 10*3/uL (ref 0.7–4.0)
MCH: 31.7 pg (ref 26.0–34.0)
MCHC: 33 g/dL (ref 30.0–36.0)
MCV: 96.2 fL (ref 80.0–100.0)
Monocytes Absolute: 0.6 10*3/uL (ref 0.1–1.0)
Monocytes Relative: 9 %
Neutro Abs: 3.6 10*3/uL (ref 1.7–7.7)
Neutrophils Relative %: 63 %
Platelets: 238 10*3/uL (ref 150–400)
RBC: 3.69 MIL/uL — ABNORMAL LOW (ref 3.87–5.11)
RDW: 14.4 % (ref 11.5–15.5)
WBC: 5.8 10*3/uL (ref 4.0–10.5)
nRBC: 0 % (ref 0.0–0.2)

## 2018-03-09 LAB — CMP (CANCER CENTER ONLY)
ALT: 12 U/L (ref 0–44)
AST: 15 U/L (ref 15–41)
Albumin: 3.7 g/dL (ref 3.5–5.0)
Alkaline Phosphatase: 71 U/L (ref 38–126)
Anion gap: 11 (ref 5–15)
BUN: 15 mg/dL (ref 8–23)
CO2: 27 mmol/L (ref 22–32)
Calcium: 9.4 mg/dL (ref 8.9–10.3)
Chloride: 106 mmol/L (ref 98–111)
Creatinine: 0.74 mg/dL (ref 0.44–1.00)
GFR, Est AFR Am: >60 mL/min (ref >60)
GFR, Estimated: >60 mL/min (ref >60)
Glucose, Bld: 98 mg/dL (ref 70–99)
Potassium: 3.2 mmol/L — ABNORMAL LOW (ref 3.5–5.1)
Sodium: 144 mmol/L (ref 135–145)
Total Bilirubin: 0.3 mg/dL (ref 0.3–1.2)
Total Protein: 6.6 g/dL (ref 6.5–8.1)

## 2018-03-09 MED ORDER — SODIUM CHLORIDE 0.9% FLUSH
10.0000 mL | INTRAVENOUS | Status: DC | PRN
  Administered 2018-03-09: 10 mL
  Filled 2018-03-09: qty 10

## 2018-03-09 MED ORDER — HEPARIN SOD (PORK) LOCK FLUSH 100 UNIT/ML IV SOLN
500.0000 [IU] | Freq: Once | INTRAVENOUS | Status: AC | PRN
  Administered 2018-03-09: 500 [IU]
  Filled 2018-03-09: qty 5

## 2018-03-09 MED ORDER — SODIUM CHLORIDE 0.9 % IV SOLN
Freq: Once | INTRAVENOUS | Status: AC
  Administered 2018-03-09: 14:00:00 via INTRAVENOUS
  Filled 2018-03-09: qty 250

## 2018-03-09 MED ORDER — SODIUM CHLORIDE 0.9 % IV SOLN
10.4000 mg/kg | Freq: Once | INTRAVENOUS | Status: AC
  Administered 2018-03-09: 620 mg via INTRAVENOUS
  Filled 2018-03-09: qty 10

## 2018-03-09 NOTE — Patient Instructions (Signed)
[facility] Discharge Instructions for Patients Receiving Chemotherapy  Today you received the following chemotherapy agents: Durvalumab  To help prevent nausea and vomiting after your treatment, we encourage you to take your nausea medication.   If you develop nausea and vomiting that is not controlled by your nausea medication, call the clinic.   BELOW ARE SYMPTOMS THAT SHOULD BE REPORTED IMMEDIATELY:  *FEVER GREATER THAN 100.5 F  *CHILLS WITH OR WITHOUT FEVER  NAUSEA AND VOMITING THAT IS NOT CONTROLLED WITH YOUR NAUSEA MEDICATION  *UNUSUAL SHORTNESS OF BREATH  *UNUSUAL BRUISING OR BLEEDING  TENDERNESS IN MOUTH AND THROAT WITH OR WITHOUT PRESENCE OF ULCERS  *URINARY PROBLEMS  *BOWEL PROBLEMS  UNUSUAL RASH Items with * indicate a potential emergency and should be followed up as soon as possible.  Feel free to call the clinic should you have any questions or concerns. The clinic phone number is [phone].  Please show the [place] at check-in to the Emergency Department and triage nurse.

## 2018-03-22 NOTE — Progress Notes (Signed)
[name]    HEMATOLOGY/ONCOLOGY CLINIC NOTE  Date of Service: 03/23/2018   Patient Care Team: [name], MD as PCP - General (Family Medicine)  CHIEF COMPLAINTS/PURPOSE OF CONSULTATION:  F/u for continued management of Non-Small Cell lung Carcinoma  HISTORY OF PRESENTING ILLNESS:   Kayla Wyatt is a wonderful 63 y.o. female who has been referred to [place] by Dr [name] for evaluation and management of Non-Small Cell Carcinoma. She is accompanied today by her husband. The pt reports that she is doing well overall.   Patient recently had a significant hospitalization on 04/02/2017 - as per DC summary by [name] " with abdominal pain, hematemesis, melena and ABLA due to GIB.  She underwent clipping of gastric ulcer and received multiple units PRBC but continued to have drop in H/H with bloody stool. NM GI scan was negative for bleeding and she underwent colonoscopy by Dr. [name] 1/31 showing diverticulosis but no signs of bleeding. Hospital course significant for R- gastrocnemius DVT, PAF, incidental findings of mediastinal mass as well as brief episode of hematuria. She was cleared to start on Xarelto on 02/01 for treatment of DVT. On 2/2 am she developed hematemesis with maroon stools and hypotension requiring fluid bolus as well as 2 units PRBCs. She continued to decline requiring intubation, pressors as well as reversal of anticoagulation with Kcentra on 02/3. She underwent UGI revealing large clot with fresh blood in stomach and underwent mesenteric arteriogram with percutaneous coil embolization fo distal tributary of left gastric artery and placement of IVC filter by interventional radiology.   A fib with RVR felt to be due to hemorrhagic shock and she converted to NSR. [name] felt no further cardiac work up indicated and did not recommend initiating anticoagulation in short term. To follow up with [name] after PET scan and routine biopsy after medically stable. She  continued to have bleeding and underwent visceral angiogram with embolization of inferior division of splenic artery and associated gastric arteries on 2/4. No surgical intervention needed per [name] with recommendations to continue monitoring H/H as well as for recurrence of hematochezia. She tolerated extubation on 2/6 and respiratory status improving.  On 2/11, she has episode of unresponsiveness with jerking of bilateral limbs that lasted about 3 minutes. She had amnesia of events but was back to baseline. EEG revealed "focal slowing over the right temporo-occipital regionandoccasional epileptiform discharges over the right occipital region". Head CT reviewed, unremarkable for acute intracranial process. MRI brain done revealing mild biparietal signal abnormality suspicious for PRES. [name] recommended starting patient on Keppra BID with repeat MRI in 2 weeks. New onset seizure likely provoked by PRES--if resolved--Ok to take patient off Keppra. Patient with resultant generalized weakness. CIR recommended due to functional deficits. "   Later as outpatient she had a PET scan on 05/14/17 which showed a hypermetabolic mediastinal mass which was subsequently biopsied with a bronchoscopy on 05/19/17. She notes that she had smoked for about 35-40 years, half a pack of a day. She reports that she has not had any other symptoms related to her smoking; she has never had to be on home oxygen or inhalers. She has recently begun Symbicort. While being in the hospital she lost about 20 lbs but has maintained her weight since being discharged.  The pt and her husband note that she is concerned about what to do if she is not able to go back to work after her 90-day leave. We recommended that they speak with  our social workers here to explore the available options for insurance coverage /disability application etc.  Of note prior to the patient's visit today, pt has had PET/CT completed on 05/14/17 with  results revealing 1. The known mediastinal mass is intensely hypermetabolic, worrisome for malignancy. This could reflect lymphoma or small cell lung cancer. Tissue sampling recommended.2. No peripheral lung mass identified. New patchy ground-glass opacity in the lingula is mildly hypermetabolic and likely inflammatory.  On 05/19/17 the pt had a Fine Needle Aspiration which revealed Non-Small cell lung carcinoma.  Most recent lab results (05/19/17) of CBC  is as follows: all values are WNL except for RBC at 2.90, Hgb at 8.6, HCT at 28.0, RDW at 16.4.  On review of systems, pt reports knee pain, and denies abdominal pains, leg swelling, SOB, CP, and any other symptoms.   On PMHx the pt reports acute blood loss anemia, GI hemorrhage with melena, hypercholesteremia, HTN, persistent atrial fibrillation with rapid ventricular response, Vitamin D deficiency, denies liver and kidney problems. On Social Hx the pt reports having smoked for 35-40 years of half a pack per day. She quit smoking on 03/31/17.  On Family Hx the pt reports that her father had heart disease.  Interval History:   Kayla Wyatt returns today regarding her Non-Small Cell Carcinoma. The patient's last visit with [place] was on 02/23/18. She is accompanied today by her brother. The pt reports that she is doing well overall.   The pt reports that she felt light headed and had heart burn for a couple days after her last infusion. She had some loose stools up to 3 times a day, small to medium sized amounts, which has since resolved. She notes that she did not feel nauseous. She denies feeling these symptoms during the infusion, or very soon after. She took tums for the heartburn which successfully resolved her heartburn. She also notes that she did not have fevers, but did experience some fatigue in the interim. She notes that her fatigue is improving. She notes that she did have some muscle aches in her left arm.   The pt notes that she has  not take Lisinopril since she was last discharged from the hospital. She notes that she has returned "more or less," to her baseline weight.   Lab results today (03/23/18) of CBC w/diff and CMP is as follows: all values are WNL except for RBC at 3.77, HGB at 11.7, Potassium at 3.3, Glucose at 107. 03/23/18 TSH is normal at 0.423  On review of systems, pt reports recent lower energy levels, recent light headedness, recent heartburn, and denies fevers, chills, nausea, vomiting, abdominal pains, leg swelling, and any other symptoms.   MEDICAL HISTORY:  Past Medical History:  Diagnosis Date  . Acute blood loss anemia 04/02/2017  . Anxiety   . Gastrointestinal hemorrhage with melena   . Headache   . Hypercholesteremia   . Hypertension   . Persistent atrial fibrillation with rapid ventricular response 04/07/2017  . Seizures ([place])    04/2017 while in hospital at [facility]  . Vitamin D deficiency     SURGICAL HISTORY: Past Surgical History:  Procedure Laterality Date  . COLONOSCOPY Left 04/09/2017   Procedure: COLONOSCOPY;  Surgeon: Carol Ada, MD;  Location: [place];  Service: Endoscopy;  Laterality: Left;  . ESOPHAGOGASTRODUODENOSCOPY N/A 04/03/2017   Procedure: ESOPHAGOGASTRODUODENOSCOPY (EGD);  Surgeon: Carol Ada, MD;  Location: [place];  Service: Endoscopy;  Laterality: N/A;  . ESOPHAGOGASTRODUODENOSCOPY (EGD) WITH PROPOFOL N/A 04/12/2017  Procedure: ESOPHAGOGASTRODUODENOSCOPY (EGD) WITH PROPOFOL;  Surgeon: Milus Banister, MD;  [facility] ENDOSCOPY;  Service: Endoscopy;  Laterality: N/A;  . IR ANGIOGRAM SELECTIVE EACH ADDITIONAL VESSEL  04/12/2017  . IR ANGIOGRAM SELECTIVE EACH ADDITIONAL VESSEL  04/13/2017  . IR ANGIOGRAM SELECTIVE EACH ADDITIONAL VESSEL  04/13/2017  . IR ANGIOGRAM SELECTIVE EACH ADDITIONAL VESSEL  04/13/2017  . IR ANGIOGRAM VISCERAL SELECTIVE  04/12/2017  . IR ANGIOGRAM VISCERAL SELECTIVE  04/13/2017  . IR EMBO ART  VEN HEMORR LYMPH EXTRAV  INC GUIDE ROADMAPPING   04/12/2017  . IR EMBO ART  VEN HEMORR LYMPH EXTRAV  INC GUIDE ROADMAPPING  04/13/2017  . IR FLUORO GUIDE CV LINE RIGHT  04/13/2017  . IR FLUORO GUIDE PORT INSERTION RIGHT  07/27/2017  . IR IVC FILTER PLMT / S&I /IMG GUID/MOD SED  04/12/2017  . IR US GUIDE VASC ACCESS RIGHT  04/12/2017  . IR US GUIDE VASC ACCESS RIGHT  04/13/2017  . IR US GUIDE VASC ACCESS RIGHT  04/13/2017  . IR US GUIDE VASC ACCESS RIGHT  07/27/2017  . LEG SURGERY  1974   Blood Clot Removal   . VIDEO BRONCHOSCOPY WITH ENDOBRONCHIAL ULTRASOUND N/A 05/19/2017   Procedure: VIDEO BRONCHOSCOPY WITH ENDOBRONCHIAL ULTRASOUND;  Surgeon: Grace Isaac, MD;  Location: [place];  Service: Thoracic;  Laterality: N/A;    SOCIAL HISTORY: Social History   Socioeconomic History  . Marital status: Single    Spouse name: Not on file  . Number of children: Not on file  . Years of education: Not on file  . Highest education level: Not on file  Occupational History  . Occupation: Has to lift heavy boxes at times.    Employer: [employer]  Social Needs  . Financial resource strain: Not on file  . Food insecurity:    Worry: Not on file    Inability: Not on file  . Transportation needs:    Medical: Not on file    Non-medical: Not on file  Tobacco Use  . Smoking status: Former Smoker    Packs/day: 0.50    Years: 40.00    Pack years: 20.00    Types: Cigarettes    Last attempt to quit: 03/31/2017    Years since quitting: 0.9  . Smokeless tobacco: Never Used  Substance and Sexual Activity  . Alcohol use: No    Frequency: Never  . Drug use: No  . Sexual activity: Not Currently  Lifestyle  . Physical activity:    Days per week: Not on file    Minutes per session: Not on file  . Stress: Not on file  Relationships  . Social connections:    Talks on phone: Not on file    Gets together: Not on file    Attends religious service: Not on file    Active member of club or organization: Not on file    Attends meetings of clubs or organizations:  Not on file    Relationship status: Not on file  . Intimate partner violence:    Fear of current or ex partner: No    Emotionally abused: No    Physically abused: No    Forced sexual activity: No  Other Topics Concern  . Not on file  Social History Narrative  . Not on file    FAMILY HISTORY: Family History  Problem Relation Age of Onset  . Heart disease Father     ALLERGIES:  has No Known Allergies.  MEDICATIONS:  Current Outpatient Medications  Medication Sig Dispense Refill  . acetaminophen (TYLENOL) 325 MG tablet Take 1-2 tablets (325-650 mg total) by mouth every 4 (four) hours as needed for mild pain.    . budesonide-formoterol (SYMBICORT) 80-4.5 MCG/ACT inhaler Inhale 2 puffs into the lungs 2 (two) times daily. 1 Inhaler 12  . cholecalciferol (VITAMIN D) 1000 units tablet Take 1,000 Units by mouth 2 (two) times daily.    [name] levETIRAcetam (KEPPRA) 500 MG tablet TK 1 T PO BID  6  . lidocaine-prilocaine (EMLA) cream Apply to affected area once 30 g 3  . LORazepam (ATIVAN) 0.5 MG tablet Take 2 tablets (1 mg total) by mouth once as needed for up to 1 dose (prior to PET/CT and MRI brain for anxiety). 6 tablet 0  . ondansetron (ZOFRAN) 8 MG tablet Take 1 tablet (8 mg total) by mouth 2 (two) times daily as needed for refractory nausea / vomiting. Start on day 3 after chemo. 30 tablet 1  . pantoprazole (PROTONIX) 40 MG tablet TK 1 T PO BID  6  . polycarbophil (FIBERCON) 625 MG tablet Take 2 tablets (1,250 mg total) by mouth 2 (two) times daily. 120 tablet 0  . potassium chloride SA (K-DUR,KLOR-CON) 20 MEQ tablet Take 1 tablet (20 mEq total) by mouth 2 (two) times daily. 60 tablet 0  . prochlorperazine (COMPAZINE) 10 MG tablet Take 1 tablet (10 mg total) by mouth every 6 (six) hours as needed (Nausea or vomiting). 30 tablet 1  . saccharomyces boulardii (FLORASTOR) 250 MG capsule Take 1 capsule (250 mg total) by mouth 2 (two) times daily. 100 capsule 0  . sucralfate (CARAFATE) 1 g tablet  Crush and mix in 10-15 mL of water prior to swallowing qAC and HS 120 tablet 1   No current facility-administered medications for this visit.     REVIEW OF SYSTEMS:    A 10+ POINT REVIEW OF SYSTEMS WAS OBTAINED including neurology, dermatology, psychiatry, cardiac, respiratory, lymph, extremities, GI, GU, Musculoskeletal, constitutional, breasts, reproductive, HEENT.  All pertinent positives are noted in the HPI.  All others are negative.   PHYSICAL EXAMINATION: ECOG PERFORMANCE STATUS: 1 - Symptomatic but completely ambulatory  VS reviewed in EPIC  GENERAL:alert, in no acute distress and comfortable SKIN: no acute rashes, no significant lesions EYES: conjunctiva are pink and non-injected, sclera anicteric OROPHARYNX: MMM, no exudates, no oropharyngeal erythema or ulceration NECK: supple, no JVD LYMPH:  no palpable lymphadenopathy in the cervical, axillary or inguinal regions LUNGS: clear to auscultation b/l with normal respiratory effort HEART: regular rate & rhythm ABDOMEN:  normoactive bowel sounds , non tender, not distended. No palpable hepatosplenomegaly.  Extremity: no pedal edema PSYCH: alert & oriented x 3 with fluent speech NEURO: no focal motor/sensory deficits   LABORATORY DATA:  I have reviewed the data as listed  . CBC Latest Ref Rng & Units 03/23/2018 03/09/2018 02/23/2018  WBC 4.0 - 10.5 K/uL 4.8 5.8 5.2  Hemoglobin 12.0 - 15.0 g/dL 11.7(L) 11.7(L) 10.8(L)  Hematocrit 36.0 - 46.0 % 36.0 35.5(L) 33.4(L)  Platelets 150 - 400 K/uL 214 238 228    . CMP Latest Ref Rng & Units 03/23/2018 03/09/2018 02/23/2018  Glucose 70 - 99 mg/dL 107(H) 98 91  BUN 8 - 23 mg/dL _0 Creatinine 0.44 - 1.00 mg/dL 0.64 0.74 0.61  Sodium 135 - 145 mmol/L 144 144 145  Potassium 3.5 - 5.1 mmol/L 3.3(L) 3.2(L) 3.3(L)  Chloride 98 - 111 mmol/L 110 106 111  CO2 22 - 32 mmol/L  _0 Calcium 8.9 - 10.3 mg/dL 9.5 9.4 9.3  Total Protein 6.5 - 8.1 g/dL 7.0 6.6 6.6  Total Bilirubin  0.3 - 1.2 mg/dL 0.4 0.3 0.3  Alkaline Phos 38 - 126 U/L 68 71 66  AST 15 - 41 U/L _1 ALT 0 - 44 U/L _2 Component     Latest Ref Rng & Units 12/28/2017  Magnesium     1.7 - 2.4 mg/dL 1.6 (L)   05/19/17 Fine Needle Aspiration:  05/19/17 Bronchial Washing Specimen B:   RADIOGRAPHIC STUDIES: I have personally reviewed the radiological images as listed and agreed with the findings in the report. No results found. MRI brain 04/21/2017: IMPRESSION: 1. Motion degraded examination. 2. 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 3. Otherwise negative MRI of the head with and without contrast for age.  Electronically Signed   By: Elon Alas M.D.   On: 04/22/2017 00:34    ASSESSMENT & PLAN:   63 y.o. female with  1. Recently diagnosed Non-Small Cell Carcinoma on bronchoscopy/cytology.  Not enough tissue for further characterization or mutation testing. Presenting with mediastinal nodal mass - TxN2 Mx (Atleast Stage III disease) ? Lingular primary vs inflammation. No overt evidence of metastatic disease.  -EGFR mutation studies on blood negative. Patient is s/p 3 cycles of Carboplatin/taxol -resolution left upper lobe ground-glass opacity has resolved in the interval. 08/19/17 PET revealed  No significant change in size of hypermetabolic AP window mass. The degree of FDG uptake is slightly increased in the interval. 2. Persistent hypermetabolic focus involving the distal aspect of the tongue within SUV max of 16.46. Similar to previous exam. (no clinical co-relate). Lung involvement has decreased.   08/19/17 Brain MRI revealed No evidence of intracranial metastases. Interval resolution of bilateral parietal signal abnormality favoring PRES  Completed concurrent Carbo/taxol chemotherapy + RT  12/24/17 PET/CT revealed Marked interval response to therapy with large AP window lymph nodes seen on the previous study  almost imperceptible on today's exam. Hypermetabolism within this lesion has resolved with FDG accumulation now at background blood pool levels. 2. Small focus of hypermetabolism in the anal region, similar to prior and indeterminate by PET imaging. 3. Hypermetabolic focus at the distal tongue has decreased in the Interval. 4. Marked pelvic floor laxity with cystocele. 5. Stable tiny left lung nodules. 6.  Aortic Atherosclerois. 7.  Emphysema.   2. Bone Pain from neulasta - managed with Tylenol  3. Previous h/ot SZ -currently on Keppra. MRI brain - resolution of findings of PRES  4. H/o rt calf DVT - off anticoagulation due to massive GI bleeding. S/p IVC filter.  5. S/p previous GI bleeding from Gastric ulcer --needing clipping and arterial embolization. hgb normal at 10.4  today. On PPI BID  6. Low Magnesium Mg 1.6 -On oral magnesium 238m BID  7. H/o ctx /steroid related thrush --now resolved.   PLAN:  -Discussed pt labwork today, 03/23/18; blood counts and chemistries are stable  -Suspect that pt had a subclinical viral infection in the interim, which has since resolved. She will continue to track her symptoms and will let me know if these return or worsen.  -The pt has no prohibitive toxicities from continuing with her fifth infusion of maintenance Durvalumab every 2 weeks, at this time.   -Encouraged pt to stay well hydrated.   -Pt will check BP readings at home, first thing in the morning, for consideration of restarting  her Lisinopril  -Recommended that the pt stay up to date with annual flu vaccine, both 5-year pneumonia vaccines, and Shingrix  -Will complete CT Chest in 3 months  -Will see the pt back in 2 weeks    -please schedule next 2 doses of Durvalumab q2weeks with labs -RTC with Dr Irene Limbo with labs in 2 weeks   All of the patients questions were answered with apparent satisfaction. The patient knows to call the clinic with any problems, questions or concerns.  The total  time spent in the appt was 25 minutes and more than 50% was on counseling and direct patient cares.   Sullivan Lone MD Kayla AAHIVMS [facility] Hematology/Oncology Physician [facility]  (Office):       [phone] (Work cell):  [phone] (Fax):           [phone]  03/23/2018 9:53 AM  I, Baldwin Jamaica, am acting as a scribe for Dr. Sullivan Lone.   .I have reviewed the above documentation for accuracy and completeness, and I agree with the above. Brunetta Genera MD

## 2018-03-23 ENCOUNTER — Other Ambulatory Visit: Payer: BLUE CROSS/BLUE SHIELD

## 2018-03-23 ENCOUNTER — Ambulatory Visit: Payer: BLUE CROSS/BLUE SHIELD

## 2018-03-23 ENCOUNTER — Ambulatory Visit: Payer: BLUE CROSS/BLUE SHIELD | Admitting: Hematology

## 2018-03-23 ENCOUNTER — Inpatient Hospital Stay: Payer: PPO

## 2018-03-23 ENCOUNTER — Inpatient Hospital Stay (HOSPITAL_BASED_OUTPATIENT_CLINIC_OR_DEPARTMENT_OTHER): Payer: PPO | Admitting: Hematology

## 2018-03-23 ENCOUNTER — Inpatient Hospital Stay: Payer: PPO | Attending: Hematology

## 2018-03-23 VITALS — BP 172/53 | HR 97 | Temp 98.1°F | Resp 18 | Ht 63.0 in | Wt 146.0 lb

## 2018-03-23 DIAGNOSIS — C3490 Malignant neoplasm of unspecified part of unspecified bronchus or lung: Secondary | ICD-10-CM | POA: Insufficient documentation

## 2018-03-23 DIAGNOSIS — Z87891 Personal history of nicotine dependence: Secondary | ICD-10-CM | POA: Diagnosis not present

## 2018-03-23 DIAGNOSIS — Z5112 Encounter for antineoplastic immunotherapy: Secondary | ICD-10-CM

## 2018-03-23 DIAGNOSIS — Z79899 Other long term (current) drug therapy: Secondary | ICD-10-CM | POA: Insufficient documentation

## 2018-03-23 DIAGNOSIS — M898X9 Other specified disorders of bone, unspecified site: Secondary | ICD-10-CM | POA: Diagnosis not present

## 2018-03-23 DIAGNOSIS — C383 Malignant neoplasm of mediastinum, part unspecified: Secondary | ICD-10-CM

## 2018-03-23 DIAGNOSIS — I10 Essential (primary) hypertension: Secondary | ICD-10-CM | POA: Diagnosis not present

## 2018-03-23 DIAGNOSIS — Z95828 Presence of other vascular implants and grafts: Secondary | ICD-10-CM

## 2018-03-23 LAB — CBC WITH DIFFERENTIAL/PLATELET
Abs Immature Granulocytes: 0.01 10*3/uL (ref 0.00–0.07)
Basophils Absolute: 0 10*3/uL (ref 0.0–0.1)
Basophils Relative: 1 %
Eosinophils Absolute: 0.1 10*3/uL (ref 0.0–0.5)
Eosinophils Relative: 3 %
HCT: 36 % (ref 36.0–46.0)
Hemoglobin: 11.7 g/dL — ABNORMAL LOW (ref 12.0–15.0)
Immature Granulocytes: 0 %
Lymphocytes Relative: 24 %
Lymphs Abs: 1.2 10*3/uL (ref 0.7–4.0)
MCH: 31 pg (ref 26.0–34.0)
MCHC: 32.5 g/dL (ref 30.0–36.0)
MCV: 95.5 fL (ref 80.0–100.0)
Monocytes Absolute: 0.5 10*3/uL (ref 0.1–1.0)
Monocytes Relative: 11 %
Neutro Abs: 3 10*3/uL (ref 1.7–7.7)
Neutrophils Relative %: 61 %
Platelets: 214 10*3/uL (ref 150–400)
RBC: 3.77 MIL/uL — ABNORMAL LOW (ref 3.87–5.11)
RDW: 14.3 % (ref 11.5–15.5)
WBC: 4.8 10*3/uL (ref 4.0–10.5)
nRBC: 0 % (ref 0.0–0.2)

## 2018-03-23 LAB — CMP (CANCER CENTER ONLY)
ALT: 14 U/L (ref 0–44)
AST: 17 U/L (ref 15–41)
Albumin: 3.8 g/dL (ref 3.5–5.0)
Alkaline Phosphatase: 68 U/L (ref 38–126)
Anion gap: 9 (ref 5–15)
BUN: 18 mg/dL (ref 8–23)
CO2: 25 mmol/L (ref 22–32)
Calcium: 9.5 mg/dL (ref 8.9–10.3)
Chloride: 110 mmol/L (ref 98–111)
Creatinine: 0.64 mg/dL (ref 0.44–1.00)
GFR, Est AFR Am: >60 mL/min (ref >60)
GFR, Estimated: >60 mL/min (ref >60)
Glucose, Bld: 107 mg/dL — ABNORMAL HIGH (ref 70–99)
Potassium: 3.3 mmol/L — ABNORMAL LOW (ref 3.5–5.1)
Sodium: 144 mmol/L (ref 135–145)
Total Bilirubin: 0.4 mg/dL (ref 0.3–1.2)
Total Protein: 7 g/dL (ref 6.5–8.1)

## 2018-03-23 LAB — TSH: TSH: 0.423 u[IU]/mL (ref 0.308–3.960)

## 2018-03-23 MED ORDER — SODIUM CHLORIDE 0.9% FLUSH
10.0000 mL | INTRAVENOUS | Status: DC | PRN
  Administered 2018-03-23: 10 mL
  Filled 2018-03-23: qty 10

## 2018-03-23 MED ORDER — HEPARIN SOD (PORK) LOCK FLUSH 100 UNIT/ML IV SOLN
500.0000 [IU] | Freq: Once | INTRAVENOUS | Status: AC | PRN
  Administered 2018-03-23: 500 [IU]
  Filled 2018-03-23: qty 5

## 2018-03-23 MED ORDER — SODIUM CHLORIDE 0.9 % IV SOLN
10.4000 mg/kg | Freq: Once | INTRAVENOUS | Status: AC
  Administered 2018-03-23: 620 mg via INTRAVENOUS
  Filled 2018-03-23: qty 2.4

## 2018-03-23 MED ORDER — SODIUM CHLORIDE 0.9 % IV SOLN
Freq: Once | INTRAVENOUS | Status: AC
  Administered 2018-03-23: 10:00:00 via INTRAVENOUS
  Filled 2018-03-23: qty 250

## 2018-03-23 NOTE — Patient Instructions (Signed)
Thank you for choosing [facility] to provide your oncology and hematology care.  To afford each patient quality time with our providers, please arrive 30 minutes before your scheduled appointment time.  If you arrive late for your appointment, you may be asked to reschedule.  We strive to give you quality time with our providers, and arriving late affects you and other patients whose appointments are after yours.    If you are a no show for multiple scheduled visits, you may be dismissed from the clinic at the providers discretion.     Again, thank you for choosing [facility], our hope is that these requests will decrease the amount of time that you wait before being seen by our physicians.  ______________________________________________________________________   Should you have questions after your visit to the  Cancer Center, please contact our office at [phone] between the hours of 8:30 and 4:30 p.m.    Voicemails left after 4:30p.m will not be returned until the following business day.     For prescription refill requests, please have your pharmacy contact us directly.  Please also try to allow 48 hours for prescription requests.     Please contact the scheduling department for questions regarding scheduling.  For scheduling of procedures such as PET scans, CT scans, MRI, Ultrasound, etc please contact central scheduling at [phone].     Resources For Cancer Patients and Caregivers:    Oncolink.org:  A wonderful resource for patients and healthcare providers for information regarding your disease, ways to tract your treatment, what to expect, etc.      American Cancer Society:  800-227-2345  Can help patients locate various types of support and financial assistance   Cancer Care: 1-800-813-HOPE (4673) Provides financial assistance, online support groups, medication/co-pay assistance.     [place] DSS:  336-641-3447 Where to apply  for food stamps, Medicaid, and utility assistance   Medicare Rights Center: 800-333-4114 Helps people with Medicare understand their rights and benefits, navigate the Medicare system, and secure the quality healthcare they deserve   SCAT: 336-333-6589 [place] [employer]'s shared-ride transportation service for eligible riders who have a disability that prevents them from riding the fixed route bus.     For additional information on assistance programs please contact our social worker:   Abigail Elmore:  [phone]  

## 2018-03-23 NOTE — Patient Instructions (Signed)
[facility] Discharge Instructions for Patients Receiving Chemotherapy  Today you received the following chemotherapy agents Durvalumab (IMFINZI).  To help prevent nausea and vomiting after your treatment, we encourage you to take your nausea medication as prescribed.   If you develop nausea and vomiting that is not controlled by your nausea medication, call the clinic.   BELOW ARE SYMPTOMS THAT SHOULD BE REPORTED IMMEDIATELY:  *FEVER GREATER THAN 100.5 F  *CHILLS WITH OR WITHOUT FEVER  NAUSEA AND VOMITING THAT IS NOT CONTROLLED WITH YOUR NAUSEA MEDICATION  *UNUSUAL SHORTNESS OF BREATH  *UNUSUAL BRUISING OR BLEEDING  TENDERNESS IN MOUTH AND THROAT WITH OR WITHOUT PRESENCE OF ULCERS  *URINARY PROBLEMS  *BOWEL PROBLEMS  UNUSUAL RASH Items with * indicate a potential emergency and should be followed up as soon as possible.  Feel free to call the clinic should you have any questions or concerns. The clinic phone number is [phone].  Please show the [place] at check-in to the Emergency Department and triage nurse.

## 2018-04-05 NOTE — Progress Notes (Signed)
[name]    HEMATOLOGY/ONCOLOGY CLINIC NOTE  Date of Service: 04/06/2018   Patient Care Team: [name], MD as PCP - General (Family Medicine)  CHIEF COMPLAINTS/PURPOSE OF CONSULTATION:  F/u for continued management of Non-Small Cell lung Carcinoma  HISTORY OF PRESENTING ILLNESS:   Kayla Wyatt is a wonderful 63 y.o. female who has been referred to [place] by Dr [name] for evaluation and management of Non-Small Cell Carcinoma. She is accompanied today by her husband. The pt reports that she is doing well overall.   Patient recently had a significant hospitalization on 04/02/2017 - as per DC summary by [name] " with abdominal pain, hematemesis, melena and ABLA due to GIB.  She underwent clipping of gastric ulcer and received multiple units PRBC but continued to have drop in H/H with bloody stool. NM GI scan was negative for bleeding and she underwent colonoscopy by Dr. [name] 1/31 showing diverticulosis but no signs of bleeding. Hospital course significant for R- gastrocnemius DVT, PAF, incidental findings of mediastinal mass as well as brief episode of hematuria. She was cleared to start on Xarelto on 02/01 for treatment of DVT. On 2/2 am she developed hematemesis with maroon stools and hypotension requiring fluid bolus as well as 2 units PRBCs. She continued to decline requiring intubation, pressors as well as reversal of anticoagulation with Kcentra on 02/3. She underwent UGI revealing large clot with fresh blood in stomach and underwent mesenteric arteriogram with percutaneous coil embolization fo distal tributary of left gastric artery and placement of IVC filter by interventional radiology.   A fib with RVR felt to be due to hemorrhagic shock and she converted to NSR. [name] felt no further cardiac work up indicated and did not recommend initiating anticoagulation in short term. To follow up with [name] after PET scan and routine biopsy after medically stable. She  continued to have bleeding and underwent visceral angiogram with embolization of inferior division of splenic artery and associated gastric arteries on 2/4. No surgical intervention needed per [name] with recommendations to continue monitoring H/H as well as for recurrence of hematochezia. She tolerated extubation on 2/6 and respiratory status improving.  On 2/11, she has episode of unresponsiveness with jerking of bilateral limbs that lasted about 3 minutes. She had amnesia of events but was back to baseline. EEG revealed "focal slowing over the right temporo-occipital regionandoccasional epileptiform discharges over the right occipital region". Head CT reviewed, unremarkable for acute intracranial process. MRI brain done revealing mild biparietal signal abnormality suspicious for PRES. [name] recommended starting patient on Keppra BID with repeat MRI in 2 weeks. New onset seizure likely provoked by PRES--if resolved--Ok to take patient off Keppra. Patient with resultant generalized weakness. CIR recommended due to functional deficits. "   Later as outpatient she had a PET scan on 05/14/17 which showed a hypermetabolic mediastinal mass which was subsequently biopsied with a bronchoscopy on 05/19/17. She notes that she had smoked for about 35-40 years, half a pack of a day. She reports that she has not had any other symptoms related to her smoking; she has never had to be on home oxygen or inhalers. She has recently begun Symbicort. While being in the hospital she lost about 20 lbs but has maintained her weight since being discharged.  The pt and her husband note that she is concerned about what to do if she is not able to go back to work after her 90-day leave. We recommended that they speak with  our social workers here to explore the available options for insurance coverage /disability application etc.  Of note prior to the patient's visit today, pt has had PET/CT completed on 05/14/17 with  results revealing 1. The known mediastinal mass is intensely hypermetabolic, worrisome for malignancy. This could reflect lymphoma or small cell lung cancer. Tissue sampling recommended.2. No peripheral lung mass identified. New patchy ground-glass opacity in the lingula is mildly hypermetabolic and likely inflammatory.  On 05/19/17 the pt had a Fine Needle Aspiration which revealed Non-Small cell lung carcinoma.  Most recent lab results (05/19/17) of CBC  is as follows: all values are WNL except for RBC at 2.90, Hgb at 8.6, HCT at 28.0, RDW at 16.4.  On review of systems, pt reports knee pain, and denies abdominal pains, leg swelling, SOB, CP, and any other symptoms.   On PMHx the pt reports acute blood loss anemia, GI hemorrhage with melena, hypercholesteremia, HTN, persistent atrial fibrillation with rapid ventricular response, Vitamin D deficiency, denies liver and kidney problems. On Social Hx the pt reports having smoked for 35-40 years of half a pack per day. She quit smoking on 03/31/17.  On Family Hx the pt reports that her father had heart disease.  Interval History:   Kayla Wyatt returns today regarding her Non-Small Cell Carcinoma. The patient's last visit with [place] was on 03/23/18. She is accompanied today by her brother. The pt reports that she is doing well overall.   The pt reports that she has not developed any new concerns in the interim. She notes that her BP has been much better controlled, and is now under the 140s. The pt denies any fevers, chills, SOB, difficulty breathing or concerns for infections.   Lab results today (04/06/18) of CBC w/diff and CMP is as follows: all values are WNL except for RBC at 3.67, HGB at 11.7, HCT at 35.5, BUN at 27.  On review of systems, pt reports good energy levels, and denies fevers, chills, night sweats, difficulty breathing, SOB, chest pain, abdominal pains, leg swelling, concerns for infections, and any other symptoms.   MEDICAL  HISTORY:  Past Medical History:  Diagnosis Date  . Acute blood loss anemia 04/02/2017  . Anxiety   . Gastrointestinal hemorrhage with melena   . Headache   . Hypercholesteremia   . Hypertension   . Persistent atrial fibrillation with rapid ventricular response 04/07/2017  . Seizures ([place])    04/2017 while in hospital at [facility]  . Vitamin D deficiency     SURGICAL HISTORY: Past Surgical History:  Procedure Laterality Date  . COLONOSCOPY Left 04/09/2017   Procedure: COLONOSCOPY;  Surgeon: Carol Ada, MD;  Location: [place];  Service: Endoscopy;  Laterality: Left;  . ESOPHAGOGASTRODUODENOSCOPY N/A 04/03/2017   Procedure: ESOPHAGOGASTRODUODENOSCOPY (EGD);  Surgeon: Carol Ada, MD;  Location: [place];  Service: Endoscopy;  Laterality: N/A;  . ESOPHAGOGASTRODUODENOSCOPY (EGD) WITH PROPOFOL N/A 04/12/2017   Procedure: ESOPHAGOGASTRODUODENOSCOPY (EGD) WITH PROPOFOL;  Surgeon: Milus Banister, MD;  Location: [facility] ENDOSCOPY;  Service: Endoscopy;  Laterality: N/A;  . IR ANGIOGRAM SELECTIVE EACH ADDITIONAL VESSEL  04/12/2017  . IR ANGIOGRAM SELECTIVE EACH ADDITIONAL VESSEL  04/13/2017  . IR ANGIOGRAM SELECTIVE EACH ADDITIONAL VESSEL  04/13/2017  . IR ANGIOGRAM SELECTIVE EACH ADDITIONAL VESSEL  04/13/2017  . IR ANGIOGRAM VISCERAL SELECTIVE  04/12/2017  . IR ANGIOGRAM VISCERAL SELECTIVE  04/13/2017  . IR EMBO ART  VEN HEMORR LYMPH EXTRAV  INC GUIDE ROADMAPPING  04/12/2017  . IR EMBO ART  VEN HEMORR LYMPH EXTRAV  INC GUIDE ROADMAPPING  04/13/2017  . IR FLUORO GUIDE CV LINE RIGHT  04/13/2017  . IR FLUORO GUIDE PORT INSERTION RIGHT  07/27/2017  . IR IVC FILTER PLMT / S&I /IMG GUID/MOD SED  04/12/2017  . IR US GUIDE VASC ACCESS RIGHT  04/12/2017  . IR US GUIDE VASC ACCESS RIGHT  04/13/2017  . IR US GUIDE VASC ACCESS RIGHT  04/13/2017  . IR US GUIDE VASC ACCESS RIGHT  07/27/2017  . LEG SURGERY  1974   Blood Clot Removal   . VIDEO BRONCHOSCOPY WITH ENDOBRONCHIAL ULTRASOUND N/A 05/19/2017   Procedure: VIDEO  BRONCHOSCOPY WITH ENDOBRONCHIAL ULTRASOUND;  Surgeon: Grace Isaac, MD;  Location: [place];  Service: Thoracic;  Laterality: N/A;    SOCIAL HISTORY: Social History   Socioeconomic History  . Marital status: Single    Spouse name: Not on file  . Number of children: Not on file  . Years of education: Not on file  . Highest education level: Not on file  Occupational History  . Occupation: Has to lift heavy boxes at times.    Employer: [employer]  Social Needs  . Financial resource strain: Not on file  . Food insecurity:    Worry: Not on file    Inability: Not on file  . Transportation needs:    Medical: Not on file    Non-medical: Not on file  Tobacco Use  . Smoking status: Former Smoker    Packs/day: 0.50    Years: 40.00    Pack years: 20.00    Types: Cigarettes    Last attempt to quit: 03/31/2017    Years since quitting: 1.0  . Smokeless tobacco: Never Used  Substance and Sexual Activity  . Alcohol use: No    Frequency: Never  . Drug use: No  . Sexual activity: Not Currently  Lifestyle  . Physical activity:    Days per week: Not on file    Minutes per session: Not on file  . Stress: Not on file  Relationships  . Social connections:    Talks on phone: Not on file    Gets together: Not on file    Attends religious service: Not on file    Active member of club or organization: Not on file    Attends meetings of clubs or organizations: Not on file    Relationship status: Not on file  . Intimate partner violence:    Fear of current or ex partner: No    Emotionally abused: No    Physically abused: No    Forced sexual activity: No  Other Topics Concern  . Not on file  Social History Narrative  . Not on file    FAMILY HISTORY: Family History  Problem Relation Age of Onset  . Heart disease Father     ALLERGIES:  has No Known Allergies.  MEDICATIONS:  Current Outpatient Medications  Medication Sig Dispense Refill  . acetaminophen (TYLENOL) 325 MG tablet  Take 1-2 tablets (325-650 mg total) by mouth every 4 (four) hours as needed for mild pain.    . budesonide-formoterol (SYMBICORT) 80-4.5 MCG/ACT inhaler Inhale 2 puffs into the lungs 2 (two) times daily. 1 Inhaler 12  . cholecalciferol (VITAMIN D) 1000 units tablet Take 1,000 Units by mouth 2 (two) times daily.    [name] levETIRAcetam (KEPPRA) 500 MG tablet TK 1 T PO BID  6  . lidocaine-prilocaine (EMLA) cream Apply to affected area once 30 g 3  .  LORazepam (ATIVAN) 0.5 MG tablet Take 2 tablets (1 mg total) by mouth once as needed for up to 1 dose (prior to PET/CT and MRI brain for anxiety). 6 tablet 0  . ondansetron (ZOFRAN) 8 MG tablet Take 1 tablet (8 mg total) by mouth 2 (two) times daily as needed for refractory nausea / vomiting. Start on day 3 after chemo. 30 tablet 1  . pantoprazole (PROTONIX) 40 MG tablet TK 1 T PO BID  6  . polycarbophil (FIBERCON) 625 MG tablet Take 2 tablets (1,250 mg total) by mouth 2 (two) times daily. 120 tablet 0  . potassium chloride SA (K-DUR,KLOR-CON) 20 MEQ tablet Take 1 tablet (20 mEq total) by mouth 2 (two) times daily. 60 tablet 0  . prochlorperazine (COMPAZINE) 10 MG tablet Take 1 tablet (10 mg total) by mouth every 6 (six) hours as needed (Nausea or vomiting). 30 tablet 1  . saccharomyces boulardii (FLORASTOR) 250 MG capsule Take 1 capsule (250 mg total) by mouth 2 (two) times daily. 100 capsule 0  . sucralfate (CARAFATE) 1 g tablet Crush and mix in 10-15 mL of water prior to swallowing qAC and HS 120 tablet 1   No current facility-administered medications for this visit.     REVIEW OF SYSTEMS:    A 10+ POINT REVIEW OF SYSTEMS WAS OBTAINED including neurology, dermatology, psychiatry, cardiac, respiratory, lymph, extremities, GI, GU, Musculoskeletal, constitutional, breasts, reproductive, HEENT.  All pertinent positives are noted in the HPI.  All others are negative.   PHYSICAL EXAMINATION: ECOG PERFORMANCE STATUS: 1 - Symptomatic but completely  ambulatory  VS reviewed in EPIC  GENERAL:alert, in no acute distress and comfortable SKIN: no acute rashes, no significant lesions EYES: conjunctiva are pink and non-injected, sclera anicteric OROPHARYNX: MMM, no exudates, no oropharyngeal erythema or ulceration NECK: supple, no JVD LYMPH:  no palpable lymphadenopathy in the cervical, axillary or inguinal regions LUNGS: clear to auscultation b/l with normal respiratory effort HEART: regular rate & rhythm ABDOMEN:  normoactive bowel sounds , non tender, not distended. No palpable hepatosplenomegaly.  Extremity: no pedal edema PSYCH: alert & oriented x 3 with fluent speech NEURO: no focal motor/sensory deficits   LABORATORY DATA:  I have reviewed the data as listed  . CBC Latest Ref Rng & Units 04/06/2018 03/23/2018 03/09/2018  WBC 4.0 - 10.5 K/uL 4.7 4.8 5.8  Hemoglobin 12.0 - 15.0 g/dL 11.7(L) 11.7(L) 11.7(L)  Hematocrit 36.0 - 46.0 % 35.5(L) 36.0 35.5(L)  Platelets 150 - 400 K/uL 222 214 238    . CMP Latest Ref Rng & Units 04/06/2018 03/23/2018 03/09/2018  Glucose 70 - 99 mg/dL 94 107(H) 98  BUN 8 - 23 mg/dL 27(H) 18 15  Creatinine 0.44 - 1.00 mg/dL 0.76 0.64 0.74  Sodium 135 - 145 mmol/L 142 144 144  Potassium 3.5 - 5.1 mmol/L 3.5 3.3(L) 3.2(L)  Chloride 98 - 111 mmol/L 107 110 106  CO2 22 - 32 mmol/L _0 Calcium 8.9 - 10.3 mg/dL 9.4 9.5 9.4  Total Protein 6.5 - 8.1 g/dL 6.9 7.0 6.6  Total Bilirubin 0.3 - 1.2 mg/dL 0.4 0.4 0.3  Alkaline Phos 38 - 126 U/L 69 68 71  AST 15 - 41 U/L _1 ALT 0 - 44 U/L _2 Component     Latest Ref Rng & Units 12/28/2017  Magnesium     1.7 - 2.4 mg/dL 1.6 (L)   05/19/17 Fine Needle Aspiration:  05/19/17 Bronchial Washing Specimen B:  RADIOGRAPHIC STUDIES: I have personally reviewed the radiological images as listed and agreed with the findings in the report. No results found. MRI brain 04/21/2017: IMPRESSION: 1. Motion degraded examination. 2. Mild biparietal  signal abnormality suspicious for posterior reversible encephalopathic syndrome. 3. Otherwise negative MRI of the head with and without contrast for age.  Electronically Signed   By: Elon Alas M.D.   On: 04/22/2017 00:34    ASSESSMENT & PLAN:   63 y.o. female with  1. Recently diagnosed Non-Small Cell Carcinoma on bronchoscopy/cytology.  Not enough tissue for further characterization or mutation testing. Presenting with mediastinal nodal mass - TxN2 Mx (Atleast Stage III disease) ? Lingular primary vs inflammation. No overt evidence of metastatic disease.  -EGFR mutation studies on blood negative. Patient is s/p 3 cycles of Carboplatin/taxol -resolution left upper lobe ground-glass opacity has resolved in the interval. 08/19/17 PET revealed  No significant change in size of hypermetabolic AP window mass. The degree of FDG uptake is slightly increased in the interval. 2. Persistent hypermetabolic focus involving the distal aspect of the tongue within SUV max of 16.46. Similar to previous exam. (no clinical co-relate). Lung involvement has decreased.   08/19/17 Brain MRI revealed No evidence of intracranial metastases. Interval resolution of bilateral parietal signal abnormality favoring PRES  Completed concurrent Carbo/taxol chemotherapy + RT  12/24/17 PET/CT revealed Marked interval response to therapy with large AP window lymph nodes seen on the previous study almost imperceptible on today's exam. Hypermetabolism within this lesion has resolved with FDG accumulation now at background blood pool levels. 2. Small focus of hypermetabolism in the anal region, similar to prior and indeterminate by PET imaging. 3. Hypermetabolic focus at the distal tongue has decreased in the Interval. 4. Marked pelvic floor laxity with cystocele. 5. Stable tiny left lung nodules. 6.  Aortic Atherosclerois. 7.  Emphysema.   2. Bone Pain from neulasta - managed with Tylenol  3. Previous h/ot SZ  -currently on Keppra. MRI brain - resolution of findings of PRES  4. H/o rt calf DVT - off anticoagulation due to massive GI bleeding. S/p IVC filter.  5. S/p previous GI bleeding from Gastric ulcer --needing clipping and arterial embolization. hgb normal at 10.4  today. On PPI BID  6. Low Magnesium Mg 1.6 -On oral magnesium 21m BID  7. H/o ctx /steroid related thrush --now resolved.   PLAN:  -Discussed pt labwork today, 04/06/18; blood counts and chemistries are stable -The pt has no prohibitive toxicities from continuing her sixth maintenance Durvalumab every 2 weeks, at this time. -Will repeat CT in 3 weeks, prior to next visit -Pt will check BP readings at home, first thing in the morning, for consideration of restarting her Lisinopril  -Recommended that the pt stay up to date with annual flu vaccine, both 5-year pneumonia vaccines, and Shingrix  -Recommended that the pt continue to eat well, drink at least 48-64 oz of water each day, and walk 20-30 minutes each day. -Will see the pt back in 4 weeks   -continue Durvalumab as per orders -- please schedule next 4 doses as ordered with labs -CT chest in 3 weeks -RTC with Dr KIrene Limboin 4 weeks   All of the patients questions were answered with apparent satisfaction. The patient knows to call the clinic with any problems, questions or concerns.  The total time spent in the appt was 25 minutes and more than 50% was on counseling and direct patient cares.   GSullivan LoneMD Kayla AAHIVMS  [facility] Hematology/Oncology Physician [place]  (Office):       [phone] (Work cell):  [phone] (Fax):           [phone]  04/06/2018 11:35 AM  I, Baldwin [place], am acting as a scribe for Dr. Sullivan Lone.   .I have reviewed the above documentation for accuracy and completeness, and I agree with the above. Brunetta Genera MD

## 2018-04-06 ENCOUNTER — Other Ambulatory Visit: Payer: BLUE CROSS/BLUE SHIELD

## 2018-04-06 ENCOUNTER — Inpatient Hospital Stay: Payer: PPO

## 2018-04-06 ENCOUNTER — Inpatient Hospital Stay (HOSPITAL_BASED_OUTPATIENT_CLINIC_OR_DEPARTMENT_OTHER): Payer: PPO | Admitting: Hematology

## 2018-04-06 ENCOUNTER — Ambulatory Visit: Payer: BLUE CROSS/BLUE SHIELD

## 2018-04-06 ENCOUNTER — Ambulatory Visit: Payer: BLUE CROSS/BLUE SHIELD | Admitting: Hematology

## 2018-04-06 VITALS — BP 137/51 | HR 84 | Temp 97.7°F | Resp 18 | Ht 63.0 in | Wt 144.9 lb

## 2018-04-06 DIAGNOSIS — M898X9 Other specified disorders of bone, unspecified site: Secondary | ICD-10-CM

## 2018-04-06 DIAGNOSIS — Z79899 Other long term (current) drug therapy: Secondary | ICD-10-CM | POA: Diagnosis not present

## 2018-04-06 DIAGNOSIS — C3490 Malignant neoplasm of unspecified part of unspecified bronchus or lung: Secondary | ICD-10-CM

## 2018-04-06 DIAGNOSIS — C383 Malignant neoplasm of mediastinum, part unspecified: Secondary | ICD-10-CM

## 2018-04-06 DIAGNOSIS — Z87891 Personal history of nicotine dependence: Secondary | ICD-10-CM

## 2018-04-06 DIAGNOSIS — Z86718 Personal history of other venous thrombosis and embolism: Secondary | ICD-10-CM | POA: Diagnosis not present

## 2018-04-06 DIAGNOSIS — Z5112 Encounter for antineoplastic immunotherapy: Secondary | ICD-10-CM

## 2018-04-06 DIAGNOSIS — I10 Essential (primary) hypertension: Secondary | ICD-10-CM | POA: Diagnosis not present

## 2018-04-06 LAB — CBC WITH DIFFERENTIAL/PLATELET
Abs Immature Granulocytes: 0.01 10*3/uL (ref 0.00–0.07)
Basophils Absolute: 0 10*3/uL (ref 0.0–0.1)
Basophils Relative: 1 %
Eosinophils Absolute: 0.1 10*3/uL (ref 0.0–0.5)
Eosinophils Relative: 3 %
HCT: 35.5 % — ABNORMAL LOW (ref 36.0–46.0)
Hemoglobin: 11.7 g/dL — ABNORMAL LOW (ref 12.0–15.0)
Immature Granulocytes: 0 %
Lymphocytes Relative: 26 %
Lymphs Abs: 1.2 10*3/uL (ref 0.7–4.0)
MCH: 31.9 pg (ref 26.0–34.0)
MCHC: 33 g/dL (ref 30.0–36.0)
MCV: 96.7 fL (ref 80.0–100.0)
Monocytes Absolute: 0.4 10*3/uL (ref 0.1–1.0)
Monocytes Relative: 9 %
Neutro Abs: 2.9 10*3/uL (ref 1.7–7.7)
Neutrophils Relative %: 61 %
Platelets: 222 10*3/uL (ref 150–400)
RBC: 3.67 MIL/uL — ABNORMAL LOW (ref 3.87–5.11)
RDW: 14 % (ref 11.5–15.5)
WBC: 4.7 10*3/uL (ref 4.0–10.5)
nRBC: 0 % (ref 0.0–0.2)

## 2018-04-06 LAB — CMP (CANCER CENTER ONLY)
ALT: 11 U/L (ref 0–44)
AST: 15 U/L (ref 15–41)
Albumin: 3.8 g/dL (ref 3.5–5.0)
Alkaline Phosphatase: 69 U/L (ref 38–126)
Anion gap: 10 (ref 5–15)
BUN: 27 mg/dL — ABNORMAL HIGH (ref 8–23)
CO2: 25 mmol/L (ref 22–32)
Calcium: 9.4 mg/dL (ref 8.9–10.3)
Chloride: 107 mmol/L (ref 98–111)
Creatinine: 0.76 mg/dL (ref 0.44–1.00)
GFR, Est AFR Am: >60 mL/min (ref >60)
GFR, Estimated: >60 mL/min (ref >60)
Glucose, Bld: 94 mg/dL (ref 70–99)
Potassium: 3.5 mmol/L (ref 3.5–5.1)
Sodium: 142 mmol/L (ref 135–145)
Total Bilirubin: 0.4 mg/dL (ref 0.3–1.2)
Total Protein: 6.9 g/dL (ref 6.5–8.1)

## 2018-04-06 MED ORDER — SODIUM CHLORIDE 0.9 % IV SOLN
10.4000 mg/kg | Freq: Once | INTRAVENOUS | Status: AC
  Administered 2018-04-06: 620 mg via INTRAVENOUS
  Filled 2018-04-06: qty 2.4

## 2018-04-06 MED ORDER — SODIUM CHLORIDE 0.9 % IV SOLN
Freq: Once | INTRAVENOUS | Status: AC
  Administered 2018-04-06: 12:00:00 via INTRAVENOUS
  Filled 2018-04-06: qty 250

## 2018-04-06 MED ORDER — SODIUM CHLORIDE 0.9% FLUSH
10.0000 mL | INTRAVENOUS | Status: DC | PRN
  Administered 2018-04-06: 10 mL
  Filled 2018-04-06: qty 10

## 2018-04-06 MED ORDER — HEPARIN SOD (PORK) LOCK FLUSH 100 UNIT/ML IV SOLN
500.0000 [IU] | Freq: Once | INTRAVENOUS | Status: AC | PRN
  Administered 2018-04-06: 500 [IU]
  Filled 2018-04-06: qty 5

## 2018-04-06 NOTE — Patient Instructions (Signed)
Thank you for choosing [facility] to provide your oncology and hematology care.  To afford each patient quality time with our providers, please arrive 30 minutes before your scheduled appointment time.  If you arrive late for your appointment, you may be asked to reschedule.  We strive to give you quality time with our providers, and arriving late affects you and other patients whose appointments are after yours.    If you are a no show for multiple scheduled visits, you may be dismissed from the clinic at the providers discretion.     Again, thank you for choosing [facility], our hope is that these requests will decrease the amount of time that you wait before being seen by our physicians.  ______________________________________________________________________   Should you have questions after your visit to the [facility], please contact our office at [phone] between the hours of 8:30 and 4:30 p.m.    Voicemails left after 4:30p.m will not be returned until the following business day.     For prescription refill requests, please have your pharmacy contact us directly.  Please also try to allow 48 hours for prescription requests.     Please contact the scheduling department for questions regarding scheduling.  For scheduling of procedures such as PET scans, CT scans, MRI, Ultrasound, etc please contact central scheduling at [phone].     Resources For Cancer Patients and Caregivers:    Oncolink.org:  A wonderful resource for patients and healthcare providers for information regarding your disease, ways to tract your treatment, what to expect, etc.      American Cancer Society:  800-227-2345  Can help patients locate various types of support and financial assistance   Cancer Care: 1-800-813-HOPE (4673) Provides financial assistance, online support groups, medication/co-pay assistance.     [place] DSS:  336-641-3447 Where to apply  for food stamps, Medicaid, and utility assistance   Medicare Rights Center: 800-333-4114 Helps people with Medicare understand their rights and benefits, navigate the Medicare system, and secure the quality healthcare they deserve   SCAT: 336-333-6589 [place] [employer]'s shared-ride transportation service for eligible riders who have a disability that prevents them from riding the fixed route bus.     For additional information on assistance programs please contact our social worker:   Kayla Wyatt:  [phone]  

## 2018-04-06 NOTE — Patient Instructions (Signed)
[facility] Discharge Instructions for Patients Receiving Chemotherapy  Today you received the following chemotherapy agents Durvalumab (IMFINZI).  To help prevent nausea and vomiting after your treatment, we encourage you to take your nausea medication as prescribed.   If you develop nausea and vomiting that is not controlled by your nausea medication, call the clinic.   BELOW ARE SYMPTOMS THAT SHOULD BE REPORTED IMMEDIATELY:  *FEVER GREATER THAN 100.5 F  *CHILLS WITH OR WITHOUT FEVER  NAUSEA AND VOMITING THAT IS NOT CONTROLLED WITH YOUR NAUSEA MEDICATION  *UNUSUAL SHORTNESS OF BREATH  *UNUSUAL BRUISING OR BLEEDING  TENDERNESS IN MOUTH AND THROAT WITH OR WITHOUT PRESENCE OF ULCERS  *URINARY PROBLEMS  *BOWEL PROBLEMS  UNUSUAL RASH Items with * indicate a potential emergency and should be followed up as soon as possible.  Feel free to call the clinic should you have any questions or concerns. The clinic phone number is [phone].  Please show the [place] at check-in to the Emergency Department and triage nurse.

## 2018-04-08 ENCOUNTER — Telehealth: Payer: Self-pay

## 2018-04-08 NOTE — Telephone Encounter (Signed)
Per 1/28 los mailed calender  of additional appointments with letter encloosed

## 2018-04-20 ENCOUNTER — Inpatient Hospital Stay: Payer: PPO | Attending: Hematology | Admitting: Hematology

## 2018-04-20 ENCOUNTER — Inpatient Hospital Stay: Payer: PPO

## 2018-04-20 VITALS — BP 127/57 | HR 71 | Temp 98.2°F | Resp 17 | Ht 63.0 in | Wt 143.8 lb

## 2018-04-20 DIAGNOSIS — Z95828 Presence of other vascular implants and grafts: Secondary | ICD-10-CM

## 2018-04-20 DIAGNOSIS — Z8669 Personal history of other diseases of the nervous system and sense organs: Secondary | ICD-10-CM | POA: Insufficient documentation

## 2018-04-20 DIAGNOSIS — Z87891 Personal history of nicotine dependence: Secondary | ICD-10-CM | POA: Insufficient documentation

## 2018-04-20 DIAGNOSIS — Z5112 Encounter for antineoplastic immunotherapy: Secondary | ICD-10-CM

## 2018-04-20 DIAGNOSIS — C3490 Malignant neoplasm of unspecified part of unspecified bronchus or lung: Secondary | ICD-10-CM

## 2018-04-20 DIAGNOSIS — K228 Other specified diseases of esophagus: Secondary | ICD-10-CM | POA: Diagnosis not present

## 2018-04-20 DIAGNOSIS — C383 Malignant neoplasm of mediastinum, part unspecified: Secondary | ICD-10-CM

## 2018-04-20 DIAGNOSIS — Z86718 Personal history of other venous thrombosis and embolism: Secondary | ICD-10-CM | POA: Diagnosis not present

## 2018-04-20 DIAGNOSIS — Z79899 Other long term (current) drug therapy: Secondary | ICD-10-CM | POA: Diagnosis not present

## 2018-04-20 LAB — CBC WITH DIFFERENTIAL/PLATELET
Abs Immature Granulocytes: 0.02 10*3/uL (ref 0.00–0.07)
Basophils Absolute: 0 10*3/uL (ref 0.0–0.1)
Basophils Relative: 1 %
Eosinophils Absolute: 0.1 10*3/uL (ref 0.0–0.5)
Eosinophils Relative: 2 %
HCT: 36.6 % (ref 36.0–46.0)
Hemoglobin: 12.1 g/dL (ref 12.0–15.0)
Immature Granulocytes: 0 %
Lymphocytes Relative: 24 %
Lymphs Abs: 1.6 10*3/uL (ref 0.7–4.0)
MCH: 31.6 pg (ref 26.0–34.0)
MCHC: 33.1 g/dL (ref 30.0–36.0)
MCV: 95.6 fL (ref 80.0–100.0)
Monocytes Absolute: 0.6 10*3/uL (ref 0.1–1.0)
Monocytes Relative: 9 %
Neutro Abs: 4.1 10*3/uL (ref 1.7–7.7)
Neutrophils Relative %: 64 %
Platelets: 214 10*3/uL (ref 150–400)
RBC: 3.83 MIL/uL — ABNORMAL LOW (ref 3.87–5.11)
RDW: 13.5 % (ref 11.5–15.5)
WBC: 6.3 10*3/uL (ref 4.0–10.5)
nRBC: 0 % (ref 0.0–0.2)

## 2018-04-20 LAB — CMP (CANCER CENTER ONLY)
ALT: 8 U/L (ref 0–44)
AST: 17 U/L (ref 15–41)
Albumin: 3.9 g/dL (ref 3.5–5.0)
Alkaline Phosphatase: 68 U/L (ref 38–126)
Anion gap: 9 (ref 5–15)
BUN: 30 mg/dL — ABNORMAL HIGH (ref 8–23)
CO2: 24 mmol/L (ref 22–32)
Calcium: 10.3 mg/dL (ref 8.9–10.3)
Chloride: 106 mmol/L (ref 98–111)
Creatinine: 0.75 mg/dL (ref 0.44–1.00)
GFR, Est AFR Am: >60 mL/min (ref >60)
GFR, Estimated: >60 mL/min (ref >60)
Glucose, Bld: 98 mg/dL (ref 70–99)
Potassium: 3.7 mmol/L (ref 3.5–5.1)
Sodium: 139 mmol/L (ref 135–145)
Total Bilirubin: 0.4 mg/dL (ref 0.3–1.2)
Total Protein: 7.1 g/dL (ref 6.5–8.1)

## 2018-04-20 MED ORDER — HEPARIN SOD (PORK) LOCK FLUSH 100 UNIT/ML IV SOLN
500.0000 [IU] | Freq: Once | INTRAVENOUS | Status: DC | PRN
  Filled 2018-04-20: qty 5

## 2018-04-20 MED ORDER — SODIUM CHLORIDE 0.9% FLUSH
10.0000 mL | INTRAVENOUS | Status: DC | PRN
  Administered 2018-04-20: 10 mL
  Filled 2018-04-20: qty 10

## 2018-04-20 MED ORDER — LORAZEPAM 0.5 MG PO TABS: 1.0000 mg | ORAL_TABLET | Freq: Once | ORAL | 0 refills | Status: DC | PRN

## 2018-04-20 MED ORDER — SODIUM CHLORIDE 0.9 % IV SOLN
10.3000 mg/kg | Freq: Once | INTRAVENOUS | Status: AC
  Administered 2018-04-20: 620 mg via INTRAVENOUS
  Filled 2018-04-20: qty 10

## 2018-04-20 MED ORDER — SODIUM CHLORIDE 0.9 % IV SOLN
Freq: Once | INTRAVENOUS | Status: AC
  Administered 2018-04-20: 16:00:00 via INTRAVENOUS
  Filled 2018-04-20: qty 250

## 2018-04-20 NOTE — Progress Notes (Signed)
Per Dr Irene Limbo ok to tx with BUN 30 today, no new orders.

## 2018-04-21 ENCOUNTER — Telehealth: Payer: Self-pay

## 2018-04-21 NOTE — Telephone Encounter (Signed)
Per 2/11 no los

## 2018-04-27 ENCOUNTER — Ambulatory Visit (HOSPITAL_COMMUNITY)
Admission: RE | Admit: 2018-04-27 | Discharge: 2018-04-27 | Disposition: A | Discharge status: Home / Self Care | Payer: PPO | Source: Ambulatory Visit | Attending: Hematology | Admitting: Hematology

## 2018-04-27 ENCOUNTER — Other Ambulatory Visit: Payer: PPO

## 2018-04-27 ENCOUNTER — Encounter (HOSPITAL_COMMUNITY): Payer: Self-pay

## 2018-04-27 DIAGNOSIS — C3490 Malignant neoplasm of unspecified part of unspecified bronchus or lung: Secondary | ICD-10-CM | POA: Insufficient documentation

## 2018-04-27 DIAGNOSIS — J439 Emphysema, unspecified: Secondary | ICD-10-CM | POA: Diagnosis not present

## 2018-04-27 MED ORDER — SODIUM CHLORIDE (PF) 0.9 % IJ SOLN
INTRAMUSCULAR | Status: AC
  Filled 2018-04-27: qty 50

## 2018-04-27 MED ORDER — IOHEXOL 300 MG/ML  SOLN
75.0000 mL | Freq: Once | INTRAMUSCULAR | Status: AC | PRN
  Administered 2018-04-27: 75 mL via INTRAVENOUS

## 2018-05-03 NOTE — Progress Notes (Signed)
[name]    HEMATOLOGY/ONCOLOGY CLINIC NOTE  Date of Service: 05/04/2018   Patient Care Team: [name], MD as PCP - General (Family Medicine)  CHIEF COMPLAINTS/PURPOSE OF CONSULTATION:  F/u for continued management of Non-Small Cell lung Carcinoma  HISTORY OF PRESENTING ILLNESS:   Kayla Wyatt is a wonderful 63 y.o. female who has been referred to [place] by Dr [name] for evaluation and management of Non-Small Cell Carcinoma. She is accompanied today by her husband. The pt reports that she is doing well overall.   Patient recently had a significant hospitalization on 04/02/2017 - as per DC summary by [name] " with abdominal pain, hematemesis, melena and ABLA due to GIB.  She underwent clipping of gastric ulcer and received multiple units PRBC but continued to have drop in H/H with bloody stool. NM GI scan was negative for bleeding and she underwent colonoscopy by Dr. [name] 1/31 showing diverticulosis but no signs of bleeding. Hospital course significant for R- gastrocnemius DVT, PAF, incidental findings of mediastinal mass as well as brief episode of hematuria. She was cleared to start on Xarelto on 02/01 for treatment of DVT. On 2/2 am she developed hematemesis with maroon stools and hypotension requiring fluid bolus as well as 2 units PRBCs. She continued to decline requiring intubation, pressors as well as reversal of anticoagulation with Kcentra on 02/3. She underwent UGI revealing large clot with fresh blood in stomach and underwent mesenteric arteriogram with percutaneous coil embolization fo distal tributary of left gastric artery and placement of IVC filter by interventional radiology.   A fib with RVR felt to be due to hemorrhagic shock and she converted to NSR. [name] felt no further cardiac work up indicated and did not recommend initiating anticoagulation in short term. To follow up with [name] after PET scan and routine biopsy after medically stable. She  continued to have bleeding and underwent visceral angiogram with embolization of inferior division of splenic artery and associated gastric arteries on 2/4. No surgical intervention needed per [name] with recommendations to continue monitoring H/H as well as for recurrence of hematochezia. She tolerated extubation on 2/6 and respiratory status improving.  On 2/11, she has episode of unresponsiveness with jerking of bilateral limbs that lasted about 3 minutes. She had amnesia of events but was back to baseline. EEG revealed "focal slowing over the right temporo-occipital regionandoccasional epileptiform discharges over the right occipital region". Head CT reviewed, unremarkable for acute intracranial process. MRI brain done revealing mild biparietal signal abnormality suspicious for PRES. [name] recommended starting patient on Keppra BID with repeat MRI in 2 weeks. New onset seizure likely provoked by PRES--if resolved--Ok to take patient off Keppra. Patient with resultant generalized weakness. CIR recommended due to functional deficits. "   Later as outpatient she had a PET scan on 05/14/17 which showed a hypermetabolic mediastinal mass which was subsequently biopsied with a bronchoscopy on 05/19/17. She notes that she had smoked for about 35-40 years, half a pack of a day. She reports that she has not had any other symptoms related to her smoking; she has never had to be on home oxygen or inhalers. She has recently begun Symbicort. While being in the hospital she lost about 20 lbs but has maintained her weight since being discharged.  The pt and her husband note that she is concerned about what to do if she is not able to go back to work after her 90-day leave. We recommended that they speak with  our social workers here to explore the available options for insurance coverage /disability application etc.  Of note prior to the patient's visit today, pt has had PET/CT completed on 05/14/17 with  results revealing 1. The known mediastinal mass is intensely hypermetabolic, worrisome for malignancy. This could reflect lymphoma or small cell lung cancer. Tissue sampling recommended.2. No peripheral lung mass identified. New patchy ground-glass opacity in the lingula is mildly hypermetabolic and likely inflammatory.  On 05/19/17 the pt had a Fine Needle Aspiration which revealed Non-Small cell lung carcinoma.  Most recent lab results (05/19/17) of CBC  is as follows: all values are WNL except for RBC at 2.90, Hgb at 8.6, HCT at 28.0, RDW at 16.4.  On review of systems, pt reports knee pain, and denies abdominal pains, leg swelling, SOB, CP, and any other symptoms.   On PMHx the pt reports acute blood loss anemia, GI hemorrhage with melena, hypercholesteremia, HTN, persistent atrial fibrillation with rapid ventricular response, Vitamin D deficiency, denies liver and kidney problems. On Social Hx the pt reports having smoked for 35-40 years of half a pack per day. She quit smoking on 03/31/17.  On Family Hx the pt reports that her father had heart disease.  Interval History:   Kayla Wyatt returns today regarding her Non-Small Cell Carcinoma. The patient's last visit with [place] was on 04/06/18. She is accompanied today by her brother. The pt reports that she is doing well overall.   The pt reports that she feels that she has had more heart burn recently. She notes that sometimes "her foods wants to stop" when she is swallowing, but denies food actually stopping while swallowing. She notes that this occurs while eating pizza. She does not use dentures, but feels she chews "pretty good.". She denies pain while swallowing. She has previously taken Protonix BID, but notes that she needs a refill and hasn't taken these in a while.   Otherwise, the pt denies any new concerns.  Of note since the patient's last visit, pt has had a CT Chest completed on 04/27/18 with results revealing Stable exam. No new  or progressive findings. 2. Stable mild circumferential wall thickening mid esophagus. Esophagitis is a consideration. 3. Stable tiny left pulmonary nodules. 4.  Emphysema. 5.  Aortic Atherosclerois.  Lab results today (05/04/18) of CBC w/diff and CMP is as follows: all values are WNL except for RBC at 3.68, HGB at 11.6, HCT at 35.4, Eosinophils abs at 700, BUN at 36.  On review of systems, pt reports recent heart burn, occasional sense of not swallowing successfully, stable energy levels, and denies pain while swallowing, choking, and any other symptoms.  MEDICAL HISTORY:  Past Medical History:  Diagnosis Date  . Acute blood loss anemia 04/02/2017  . Anxiety   . Gastrointestinal hemorrhage with melena   . Headache   . Hypercholesteremia   . Hypertension   . Persistent atrial fibrillation with rapid ventricular response 04/07/2017  . Seizures ([place])    04/2017 while in hospital at [facility]  . Vitamin D deficiency     SURGICAL HISTORY: Past Surgical History:  Procedure Laterality Date  . COLONOSCOPY Left 04/09/2017   Procedure: COLONOSCOPY;  Surgeon: Carol Ada, MD;  Location: [place];  Service: Endoscopy;  Laterality: Left;  . ESOPHAGOGASTRODUODENOSCOPY N/A 04/03/2017   Procedure: ESOPHAGOGASTRODUODENOSCOPY (EGD);  Surgeon: Carol Ada, MD;  Location: [place];  Service: Endoscopy;  Laterality: N/A;  . ESOPHAGOGASTRODUODENOSCOPY (EGD) WITH PROPOFOL N/A 04/12/2017   Procedure: ESOPHAGOGASTRODUODENOSCOPY (EGD) WITH  PROPOFOL;  Surgeon: Milus Banister, MD;  Location: [facility]COPY;  Service: Endoscopy;  Laterality: N/A;  . IR ANGIOGRAM SELECTIVE EACH ADDITIONAL VESSEL  04/12/2017  . IR ANGIOGRAM SELECTIVE EACH ADDITIONAL VESSEL  04/13/2017  . IR ANGIOGRAM SELECTIVE EACH ADDITIONAL VESSEL  04/13/2017  . IR ANGIOGRAM SELECTIVE EACH ADDITIONAL VESSEL  04/13/2017  . IR ANGIOGRAM VISCERAL SELECTIVE  04/12/2017  . IR ANGIOGRAM VISCERAL SELECTIVE  04/13/2017  . IR EMBO ART  VEN HEMORR LYMPH EXTRAV   INC GUIDE ROADMAPPING  04/12/2017  . IR EMBO ART  VEN HEMORR LYMPH EXTRAV  INC GUIDE ROADMAPPING  04/13/2017  . IR FLUORO GUIDE CV LINE RIGHT  04/13/2017  . IR FLUORO GUIDE PORT INSERTION RIGHT  07/27/2017  . IR IVC FILTER PLMT / S&I /IMG GUID/MOD SED  04/12/2017  . IR US GUIDE VASC ACCESS RIGHT  04/12/2017  . IR US GUIDE VASC ACCESS RIGHT  04/13/2017  . IR US GUIDE VASC ACCESS RIGHT  04/13/2017  . IR US GUIDE VASC ACCESS RIGHT  07/27/2017  . LEG SURGERY  1974   Blood Clot Removal   . VIDEO BRONCHOSCOPY WITH ENDOBRONCHIAL ULTRASOUND N/A 05/19/2017   Procedure: VIDEO BRONCHOSCOPY WITH ENDOBRONCHIAL ULTRASOUND;  Surgeon: Grace Isaac, MD;  Location: [place];  Service: Thoracic;  Laterality: N/A;    SOCIAL HISTORY: Social History   Socioeconomic History  . Marital status: Single    Spouse name: Not on file  . Number of children: Not on file  . Years of education: Not on file  . Highest education level: Not on file  Occupational History  . Occupation: Has to lift heavy boxes at times.    Employer: [employer]  Social Needs  . Financial resource strain: Not on file  . Food insecurity:    Worry: Not on file    Inability: Not on file  . Transportation needs:    Medical: Not on file    Non-medical: Not on file  Tobacco Use  . Smoking status: Former Smoker    Packs/day: 0.50    Years: 40.00    Pack years: 20.00    Types: Cigarettes    Last attempt to quit: 03/31/2017    Years since quitting: 1.0  . Smokeless tobacco: Never Used  Substance and Sexual Activity  . Alcohol use: No    Frequency: Never  . Drug use: No  . Sexual activity: Not Currently  Lifestyle  . Physical activity:    Days per week: Not on file    Minutes per session: Not on file  . Stress: Not on file  Relationships  . Social connections:    Talks on phone: Not on file    Gets together: Not on file    Attends religious service: Not on file    Active member of club or organization: Not on file    Attends meetings of  clubs or organizations: Not on file    Relationship status: Not on file  . Intimate partner violence:    Fear of current or ex partner: No    Emotionally abused: No    Physically abused: No    Forced sexual activity: No  Other Topics Concern  . Not on file  Social History Narrative  . Not on file    FAMILY HISTORY: Family History  Problem Relation Age of Onset  . Heart disease Father     ALLERGIES:  has No Known Allergies.  MEDICATIONS:  Current Outpatient Medications  Medication Sig Dispense  Refill  . acetaminophen (TYLENOL) 325 MG tablet Take 1-2 tablets (325-650 mg total) by mouth every 4 (four) hours as needed for mild pain.    . budesonide-formoterol (SYMBICORT) 80-4.5 MCG/ACT inhaler Inhale 2 puffs into the lungs 2 (two) times daily. 1 Inhaler 12  . cholecalciferol (VITAMIN D) 1000 units tablet Take 1,000 Units by mouth 2 (two) times daily.    [name] levETIRAcetam (KEPPRA) 500 MG tablet TK 1 T PO BID  6  . lidocaine-prilocaine (EMLA) cream Apply to affected area once 30 g 3  . lisinopril-hydrochlorothiazide (PRINZIDE,ZESTORETIC) 20-12.5 MG tablet Take 1 tablet by mouth daily.    [name] LORazepam (ATIVAN) 0.5 MG tablet Take 2 tablets (1 mg total) by mouth once as needed for up to 1 dose (prior to PET/CT and MRI brain for anxiety). 6 tablet 0  . ondansetron (ZOFRAN) 8 MG tablet Take 1 tablet (8 mg total) by mouth 2 (two) times daily as needed for refractory nausea / vomiting. Start on day 3 after chemo. 30 tablet 1  . pantoprazole (PROTONIX) 40 MG tablet TK 1 T PO BID  6  . polycarbophil (FIBERCON) 625 MG tablet Take 2 tablets (1,250 mg total) by mouth 2 (two) times daily. 120 tablet 0  . potassium chloride SA (K-DUR,KLOR-CON) 20 MEQ tablet Take 1 tablet (20 mEq total) by mouth 2 (two) times daily. 60 tablet 0  . prochlorperazine (COMPAZINE) 10 MG tablet Take 1 tablet (10 mg total) by mouth every 6 (six) hours as needed (Nausea or vomiting). 30 tablet 1  . saccharomyces boulardii  (FLORASTOR) 250 MG capsule Take 1 capsule (250 mg total) by mouth 2 (two) times daily. 100 capsule 0  . sucralfate (CARAFATE) 1 g tablet Crush and mix in 10-15 mL of water prior to swallowing qAC and HS 120 tablet 1   No current facility-administered medications for this visit.     REVIEW OF SYSTEMS:    A 10+ POINT REVIEW OF SYSTEMS WAS OBTAINED including neurology, dermatology, psychiatry, cardiac, respiratory, lymph, extremities, GI, GU, Musculoskeletal, constitutional, breasts, reproductive, HEENT.  All pertinent positives are noted in the HPI.  All others are negative.   PHYSICAL EXAMINATION: ECOG PERFORMANCE STATUS: 1 - Symptomatic but completely ambulatory  VS reviewed in EPIC  GENERAL:alert, in no acute distress and comfortable SKIN: no acute rashes, no significant lesions EYES: conjunctiva are pink and non-injected, sclera anicteric OROPHARYNX: MMM, no exudates, no oropharyngeal erythema or ulceration NECK: supple, no JVD LYMPH:  no palpable lymphadenopathy in the cervical, axillary or inguinal regions LUNGS: clear to auscultation b/l with normal respiratory effort HEART: regular rate & rhythm ABDOMEN:  normoactive bowel sounds , non tender, not distended. No palpable hepatosplenomegaly.  Extremity: no pedal edema PSYCH: alert & oriented x 3 with fluent speech NEURO: no focal motor/sensory deficits   LABORATORY DATA:  I have reviewed the data as listed  . CBC Latest Ref Rng & Units 05/04/2018 04/20/2018 04/06/2018  WBC 4.0 - 10.5 K/uL 6.7 6.3 4.7  Hemoglobin 12.0 - 15.0 g/dL 11.6(L) 12.1 11.7(L)  Hematocrit 36.0 - 46.0 % 35.4(L) 36.6 35.5(L)  Platelets 150 - 400 K/uL 229 214 222    . CMP Latest Ref Rng & Units 05/04/2018 04/20/2018 04/06/2018  Glucose 70 - 99 mg/dL 98 98 94  BUN 8 - 23 mg/dL 36(H) 30(H) 27(H)  Creatinine 0.44 - 1.00 mg/dL 0.79 0.75 0.76  Sodium 135 - 145 mmol/L 140 139 142  Potassium 3.5 - 5.1 mmol/L 4.0 3.7 3.5  Chloride 98 -  111 mmol/L 106 106 107    CO2 22 - 32 mmol/L _0 Calcium 8.9 - 10.3 mg/dL 9.4 10.3 9.4  Total Protein 6.5 - 8.1 g/dL 7.0 7.1 6.9  Total Bilirubin 0.3 - 1.2 mg/dL 0.4 0.4 0.4  Alkaline Phos 38 - 126 U/L 74 68 69  AST 15 - 41 U/L _1 ALT 0 - 44 U/L _2 Component     Latest Ref Rng & Units 12/28/2017  Magnesium     1.7 - 2.4 mg/dL 1.6 (L)   05/19/17 Fine Needle Aspiration:  05/19/17 Bronchial Washing Specimen B:   RADIOGRAPHIC STUDIES: I have personally reviewed the radiological images as listed and agreed with the findings in the report. Ct Chest W Contrast  Result Date: 04/27/2018 CLINICAL DATA:  Lung cancer. EXAM: CT CHEST WITH CONTRAST TECHNIQUE: Multidetector CT imaging of the chest was performed during intravenous contrast administration. CONTRAST:  31m OMNIPAQUE IOHEXOL 300 MG/ML  SOLN COMPARISON:  PET-CT 12/24/2017. FINDINGS: Cardiovascular: The heart size is normal. No substantial pericardial effusion. Coronary artery calcification is evident. Atherosclerotic calcification is noted in the wall of the thoracic aorta. Right Port-A-Cath tip is at the SVC/RA junction. Mediastinum/Nodes: Subpleural node measured previously at 12 mm short axis in the AP window is stable at 12 mm. No other mediastinal lymphadenopathy evident. No hilar lymphadenopathy. Mild circumferential wall thickening noted mid esophagus. There is no axillary lymphadenopathy. Lungs/Pleura: The central tracheobronchial airways are patent. Centrilobular and paraseptal emphysema noted. Biapical pleuroparenchymal scarring evident. Previously identified 3 mm left upper lobe nodule (65/7) is unchanged. 2 mm left lower lobe nodule (112/7) is also stable. No new suspicious pulmonary nodule or mass. No pleural effusion. Upper Abdomen: Unremarkable. Musculoskeletal: No worrisome lytic or sclerotic osseous abnormality. IMPRESSION: 1. Stable exam. No new or progressive findings. 2. Stable mild circumferential wall thickening mid esophagus.  Esophagitis is a consideration. 3. Stable tiny left pulmonary nodules. 4.  Emphysema. (ICD10-J43.9) 5.  Aortic Atherosclerois (ICD10-170.0) Electronically Signed   By: EMisty StanleyM.D.   On: 04/27/2018 15:37   MRI brain 04/21/2017: IMPRESSION: 1. Motion degraded examination. 2. 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 3. Otherwise negative MRI of the head with and without contrast for age.  Electronically Signed   By: CElon AlasM.D.   On: 04/22/2017 00:34    ASSESSMENT & PLAN:   63y.o. female with  1. Recently diagnosed Non-Small Cell Carcinoma on bronchoscopy/cytology.  Not enough tissue for further characterization or mutation testing. Presenting with mediastinal nodal mass - TxN2 Mx (Atleast Stage III disease) ? Lingular primary vs inflammation. No overt evidence of metastatic disease.  -EGFR mutation studies on blood negative. Patient is s/p 3 cycles of Carboplatin/taxol -resolution left upper lobe ground-glass opacity has resolved in the interval. 08/19/17 PET revealed  No significant change in size of hypermetabolic AP window mass. The degree of FDG uptake is slightly increased in the interval. 2. Persistent hypermetabolic focus involving the distal aspect of the tongue within SUV max of 16.46. Similar to previous exam. (no clinical co-relate). Lung involvement has decreased.   08/19/17 Brain MRI revealed No evidence of intracranial metastases. Interval resolution of bilateral parietal signal abnormality favoring PRES  Completed concurrent Carbo/taxol chemotherapy + RT  12/24/17 PET/CT revealed Marked interval response to therapy with large AP window lymph nodes seen on the previous study almost imperceptible on today's exam. Hypermetabolism within this lesion has resolved with FDG accumulation now  at background blood pool levels. 2. Small focus of hypermetabolism in the anal region, similar to prior and  indeterminate by PET imaging. 3. Hypermetabolic focus at the distal tongue has decreased in the Interval. 4. Marked pelvic floor laxity with cystocele. 5. Stable tiny left lung nodules. 6.  Aortic Atherosclerois. 7.  Emphysema.   2. Bone Pain from neulasta - managed with Tylenol  3. Previous h/ot SZ -currently on Keppra. MRI brain - resolution of findings of PRES  4. H/o rt calf DVT - off anticoagulation due to massive GI bleeding. S/p IVC filter.  5. S/p previous GI bleeding from Gastric ulcer --needing clipping and arterial embolization. hgb normal at 10.4  today. On PPI BID  6. Low Magnesium Mg 1.6 -On oral magnesium 264m BID  7. H/o ctx /steroid related thrush --now resolved.   PLAN:  -Discussed pt labwork today, 05/04/18; blood counts and chemistries are stable -Discussed the 04/27/18 CT Chest which revealed Stable exam. No new or progressive findings. 2. Stable mild circumferential wall thickening mid esophagus. Esophagitis is a consideration. 3. Stable tiny left pulmonary nodules. 4.  Emphysema. 5.  Aortic Atherosclerois. -Discussed that the patient's 04/03/17 endoscopy did not reveal esophageal thickening -Recommend that the pt speak with her PCP or GI Dr. JArdis Hughsto see if he would recommend a repeated endoscopy and for management of her circumferential wall thickening, as the pt does have a smoking history. She notes she would like to discuss this further with her PCP, and will let me know if she decides she would like a referral -Esophageal thickening could be due to acid reflux or previous radiation, or less likely a new primary malignancy  -Recommend pt call her PCP for medication refill of Protonix -Recommend avoiding acidic foods, and not lying down after eating -The pt has no prohibitive toxicities from continuing her eighth maintenance Durvalumab every 2 weeks at this time.   -Pt will check BP readings at home, first thing in the morning, for consideration of restarting her  Lisinopril  -Recommended that the pt stay up to date with annual flu vaccine, both 5-year pneumonia vaccines, and Shingrix  -Recommended that the pt continue to eat well, drink at least 48-64 oz of water each day, and walk 20-30 minutes each day. -Will see the pt back in 4 weeks   -Continue Durvalumab q2weeks with labs as per orders- plz schedule next 4 doses. May discontinue MD visit on 3/10 (will plan to see patient with every other treatment q4 weeks)    All of the patients questions were answered with apparent satisfaction. The patient knows to call the clinic with any problems, questions or concerns.  The total time spent in the appt was 30 minutes and more than 50% was on counseling and direct patient cares.   GSullivan LoneMD Kayla AAHIVMS SSwedish American HospitalCWoodstock Endoscopy CenterHematology/Oncology Physician CL[facility] (Office):       [phone](Work cell):  [phone](Fax):           [phone] 05/04/2018 3:50 PM  I, SBaldwin Jamaica am acting as a scribe for Dr. GSullivan Lone   .I have reviewed the above documentation for accuracy and completeness, and I agree with the above. .Brunetta GeneraMD

## 2018-05-04 ENCOUNTER — Telehealth: Payer: Self-pay | Admitting: Hematology

## 2018-05-04 ENCOUNTER — Inpatient Hospital Stay: Payer: PPO

## 2018-05-04 ENCOUNTER — Inpatient Hospital Stay (HOSPITAL_BASED_OUTPATIENT_CLINIC_OR_DEPARTMENT_OTHER): Payer: PPO | Admitting: Hematology

## 2018-05-04 VITALS — BP 122/56 | HR 66 | Temp 97.7°F | Resp 18 | Ht 63.0 in | Wt 145.1 lb

## 2018-05-04 DIAGNOSIS — Z8669 Personal history of other diseases of the nervous system and sense organs: Secondary | ICD-10-CM

## 2018-05-04 DIAGNOSIS — Z5112 Encounter for antineoplastic immunotherapy: Secondary | ICD-10-CM

## 2018-05-04 DIAGNOSIS — C3490 Malignant neoplasm of unspecified part of unspecified bronchus or lung: Secondary | ICD-10-CM

## 2018-05-04 DIAGNOSIS — Z87891 Personal history of nicotine dependence: Secondary | ICD-10-CM | POA: Diagnosis not present

## 2018-05-04 DIAGNOSIS — Z79899 Other long term (current) drug therapy: Secondary | ICD-10-CM | POA: Diagnosis not present

## 2018-05-04 DIAGNOSIS — K228 Other specified diseases of esophagus: Secondary | ICD-10-CM

## 2018-05-04 DIAGNOSIS — K2289 Other specified disease of esophagus: Secondary | ICD-10-CM

## 2018-05-04 DIAGNOSIS — Z86718 Personal history of other venous thrombosis and embolism: Secondary | ICD-10-CM | POA: Diagnosis not present

## 2018-05-04 DIAGNOSIS — Z95828 Presence of other vascular implants and grafts: Secondary | ICD-10-CM

## 2018-05-04 DIAGNOSIS — C383 Malignant neoplasm of mediastinum, part unspecified: Secondary | ICD-10-CM

## 2018-05-04 LAB — CMP (CANCER CENTER ONLY)
ALT: 16 U/L (ref 0–44)
AST: 19 U/L (ref 15–41)
Albumin: 3.8 g/dL (ref 3.5–5.0)
Alkaline Phosphatase: 74 U/L (ref 38–126)
Anion gap: 10 (ref 5–15)
BUN: 36 mg/dL — ABNORMAL HIGH (ref 8–23)
CO2: 24 mmol/L (ref 22–32)
Calcium: 9.4 mg/dL (ref 8.9–10.3)
Chloride: 106 mmol/L (ref 98–111)
Creatinine: 0.79 mg/dL (ref 0.44–1.00)
GFR, Est AFR Am: >60 mL/min (ref >60)
GFR, Estimated: >60 mL/min (ref >60)
Glucose, Bld: 98 mg/dL (ref 70–99)
Potassium: 4 mmol/L (ref 3.5–5.1)
Sodium: 140 mmol/L (ref 135–145)
Total Bilirubin: 0.4 mg/dL (ref 0.3–1.2)
Total Protein: 7 g/dL (ref 6.5–8.1)

## 2018-05-04 LAB — CBC WITH DIFFERENTIAL/PLATELET
Abs Immature Granulocytes: 0.02 10*3/uL (ref 0.00–0.07)
Basophils Absolute: 0 10*3/uL (ref 0.0–0.1)
Basophils Relative: 1 %
Eosinophils Absolute: 0.7 10*3/uL — ABNORMAL HIGH (ref 0.0–0.5)
Eosinophils Relative: 10 %
HCT: 35.4 % — ABNORMAL LOW (ref 36.0–46.0)
Hemoglobin: 11.6 g/dL — ABNORMAL LOW (ref 12.0–15.0)
Immature Granulocytes: 0 %
Lymphocytes Relative: 24 %
Lymphs Abs: 1.6 10*3/uL (ref 0.7–4.0)
MCH: 31.5 pg (ref 26.0–34.0)
MCHC: 32.8 g/dL (ref 30.0–36.0)
MCV: 96.2 fL (ref 80.0–100.0)
Monocytes Absolute: 0.6 10*3/uL (ref 0.1–1.0)
Monocytes Relative: 9 %
Neutro Abs: 3.7 10*3/uL (ref 1.7–7.7)
Neutrophils Relative %: 56 %
Platelets: 229 10*3/uL (ref 150–400)
RBC: 3.68 MIL/uL — ABNORMAL LOW (ref 3.87–5.11)
RDW: 13.6 % (ref 11.5–15.5)
WBC: 6.7 10*3/uL (ref 4.0–10.5)
nRBC: 0 % (ref 0.0–0.2)

## 2018-05-04 IMAGING — CT NM PET TUM IMG INITIAL (PI) SKULL BASE T - THIGH
1 of 8 series · 1 of 25 positions shown · non-contrast
Comparison: Chest CT 04/07/2017.  Abdominopelvic CT 04/13/2017.

CLINICAL DATA: Initial treatment strategy for mediastinal mass on
CT.

EXAM:
NUCLEAR MEDICINE PET SKULL BASE TO THIGH
TECHNIQUE: 6.7 mCi F-18 FDG was injected intravenously. Full-ring PET imaging
was performed from the skull base to thigh after the radiotracer. CT
data was obtained and used for attenuation correction and anatomic
localization.
Fasting blood glucose: 90 mg/dl
Mediastinal blood pool activity: SUV max

[Series 4: ct sk_thigh 5.0 b31f · axial · 5.0mm · 0.98mm/px · 1 of 239 slices shown]
[im 239/239  brain]
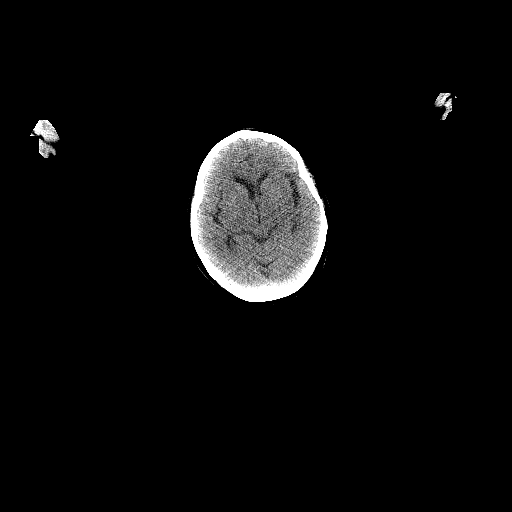

[1 of 25 positions shown; findings below may reference images not displayed]

FINDINGS: NECK:

No hypermetabolic cervical lymph nodes are identified.There is
intense hypermetabolic activity anteriorly in the oral cavity
associated with the tongue, probably physiologic. This has an SUV
max of 17.3, however, more than typically seen. Correlation with
physical examination recommended. There is also mildly increased
metabolic activity within the left pterygoid musculature, also
likely physiologic. No suspicious findings of the pharyngeal mucosal
space identified on the CT images.

Incidental CT findings: Mild carotid atherosclerosis.

CHEST:

The previously demonstrated 4.3 x 3.1 cm AP window mass is intensely
hypermetabolic with an SUV max of 11.0. No other hypermetabolic
mediastinal or axillary lymph nodes are demonstrated. There is new
patchy ground-glass opacity in the lingula measuring up to 2.3 cm on
image [DATE]. This is also mildly hypermetabolic (SUV max 3.9).
Because it is new from the previous chest CT, this is probably
inflammatory. No suspicious pulmonary nodules are identified.

Incidental CT findings: Moderate centrilobular emphysema.
Atherosclerosis of the aorta, great vessels and coronary arteries.

ABDOMEN/PELVIS:

There is no hypermetabolic activity within the liver, adrenal
glands, spleen or pancreas. There is no hypermetabolic nodal
activity. As demonstrated on previous CT, there is mild asymmetric
dilatation of the left renal collecting system and left ureter
without high-grade obstruction. There is limited bladder evaluation
by PET-CT, especially in this case as the bladder is nearly empty
and there appears to be mild pelvic floor laxity. No pelvic mass
identified.

Incidental CT findings: Postsurgical changes in the left upper
quadrant of the abdomen. IVC filter and severe aortic
atherosclerosis. There is extensive calcification within the lumen
of the abdominal aorta inferior to the renal arteries.

SKELETON:

There is no hypermetabolic activity to suggest osseous metastatic
disease.

Incidental CT findings: none
IMPRESSION: 1. The known mediastinal mass is intensely hypermetabolic, worrisome
for malignancy. This could reflect lymphoma or small cell lung
cancer. Tissue sampling recommended.
2. No peripheral lung mass identified. New patchy ground-glass
opacity in the lingula is mildly hypermetabolic and likely
inflammatory.
3. Intense activity associated with the anterior oral cavity,
probably physiologic. Clinical correlation to exclude tongue lesion
recommended.
4. Mild hydroureter on the left, similar to previous CT and possibly
related to pelvic floor laxity.
5. Aortic Atherosclerosis ([id number]). Severe involvement the
infrarenal abdominal aorta.

## 2018-05-04 MED ORDER — SODIUM CHLORIDE 0.9 % IV SOLN
Freq: Once | INTRAVENOUS | Status: AC
  Administered 2018-05-04: 16:00:00 via INTRAVENOUS
  Filled 2018-05-04: qty 250

## 2018-05-04 MED ORDER — HEPARIN SOD (PORK) LOCK FLUSH 100 UNIT/ML IV SOLN
500.0000 [IU] | Freq: Once | INTRAVENOUS | Status: AC | PRN
  Administered 2018-05-04: 500 [IU]
  Filled 2018-05-04: qty 5

## 2018-05-04 MED ORDER — SODIUM CHLORIDE 0.9 % IV SOLN
10.3000 mg/kg | Freq: Once | INTRAVENOUS | Status: AC
  Administered 2018-05-04: 620 mg via INTRAVENOUS
  Filled 2018-05-04: qty 10

## 2018-05-04 MED ORDER — SODIUM CHLORIDE 0.9% FLUSH
10.0000 mL | INTRAVENOUS | Status: DC | PRN
  Administered 2018-05-04: 10 mL
  Filled 2018-05-04: qty 10

## 2018-05-04 NOTE — Patient Instructions (Signed)
[facility] Discharge Instructions for Patients Receiving Chemotherapy  Today you received the following chemotherapy agents Durvalumab (IMFINZI).  To help prevent nausea and vomiting after your treatment, we encourage you to take your nausea medication as prescribed.   If you develop nausea and vomiting that is not controlled by your nausea medication, call the clinic.   BELOW ARE SYMPTOMS THAT SHOULD BE REPORTED IMMEDIATELY:  *FEVER GREATER THAN 100.5 F  *CHILLS WITH OR WITHOUT FEVER  NAUSEA AND VOMITING THAT IS NOT CONTROLLED WITH YOUR NAUSEA MEDICATION  *UNUSUAL SHORTNESS OF BREATH  *UNUSUAL BRUISING OR BLEEDING  TENDERNESS IN MOUTH AND THROAT WITH OR WITHOUT PRESENCE OF ULCERS  *URINARY PROBLEMS  *BOWEL PROBLEMS  UNUSUAL RASH Items with * indicate a potential emergency and should be followed up as soon as possible.  Feel free to call the clinic should you have any questions or concerns. The clinic phone number is [phone].  Please show the [place] at check-in to the Emergency Department and triage nurse.

## 2018-05-04 NOTE — Telephone Encounter (Signed)
Scheduled appt per 2/25 los.  Treatment area will print out her new appt calendar.

## 2018-05-09 IMAGING — CR DG CHEST 2V
3 series · 3 of 3 positions shown · non-contrast
Comparison: PET-CT May 14, 2017; chest radiograph April 15, 2017

CLINICAL DATA: Preoperative assessment pre biopsy. Hypertension.
Atrial fibrillation.

EXAM:
CHEST - 2 VIEW

[w chest pa]
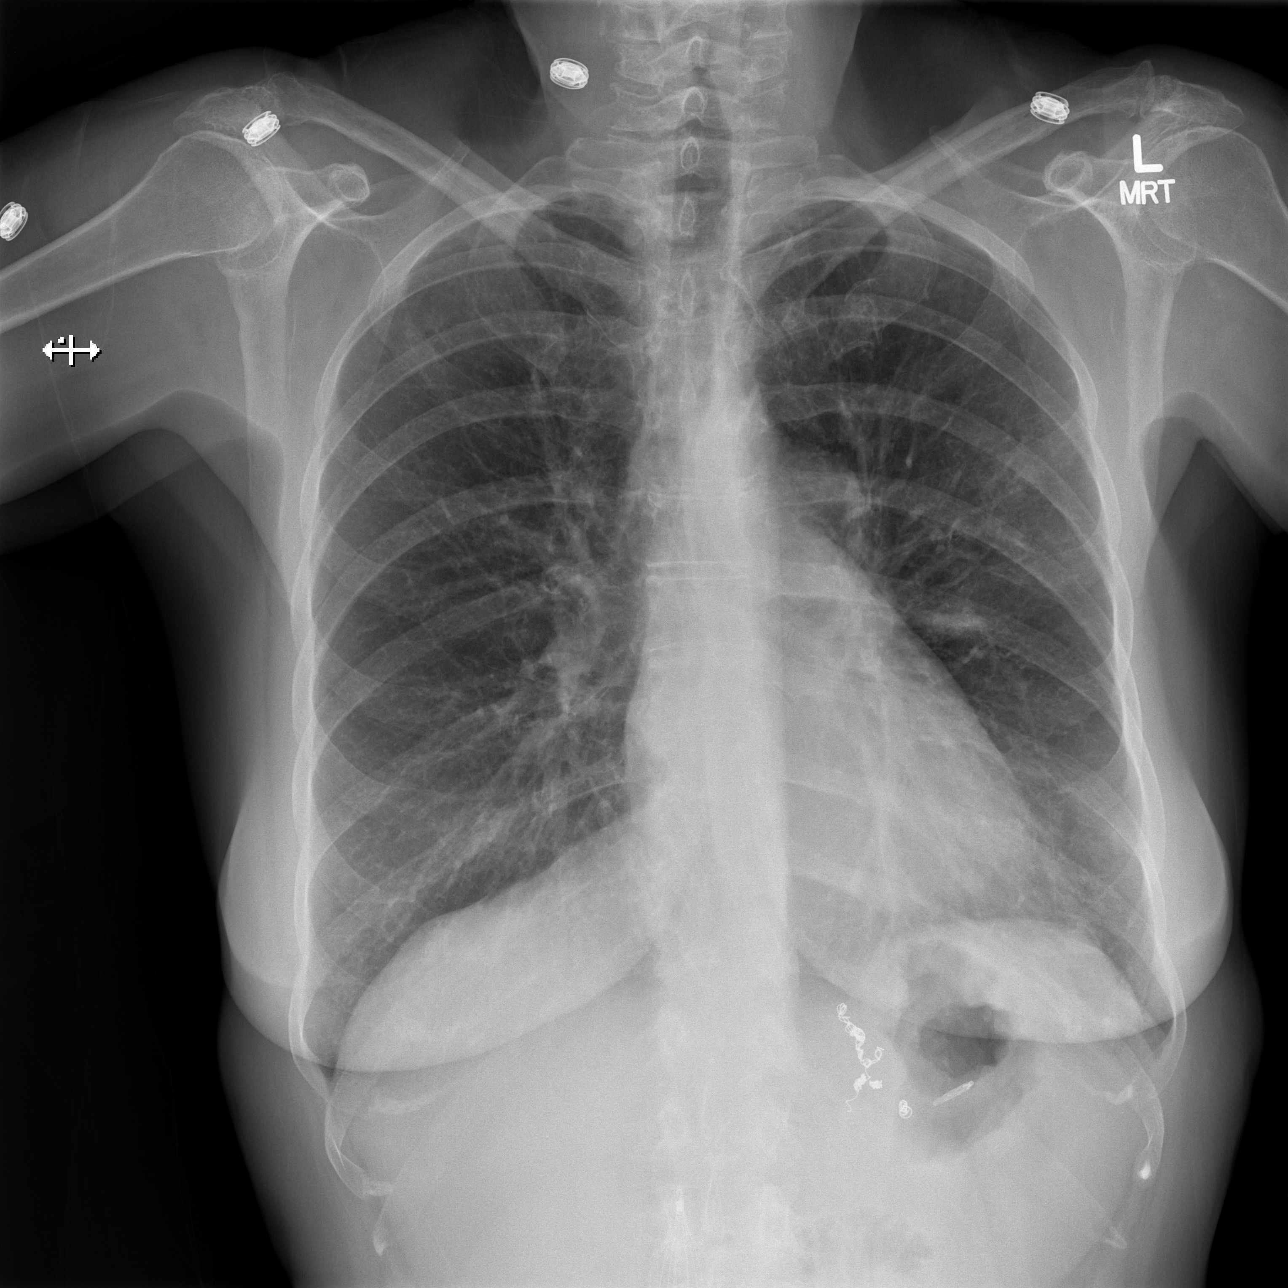

[w chest lat (1 of 2)]
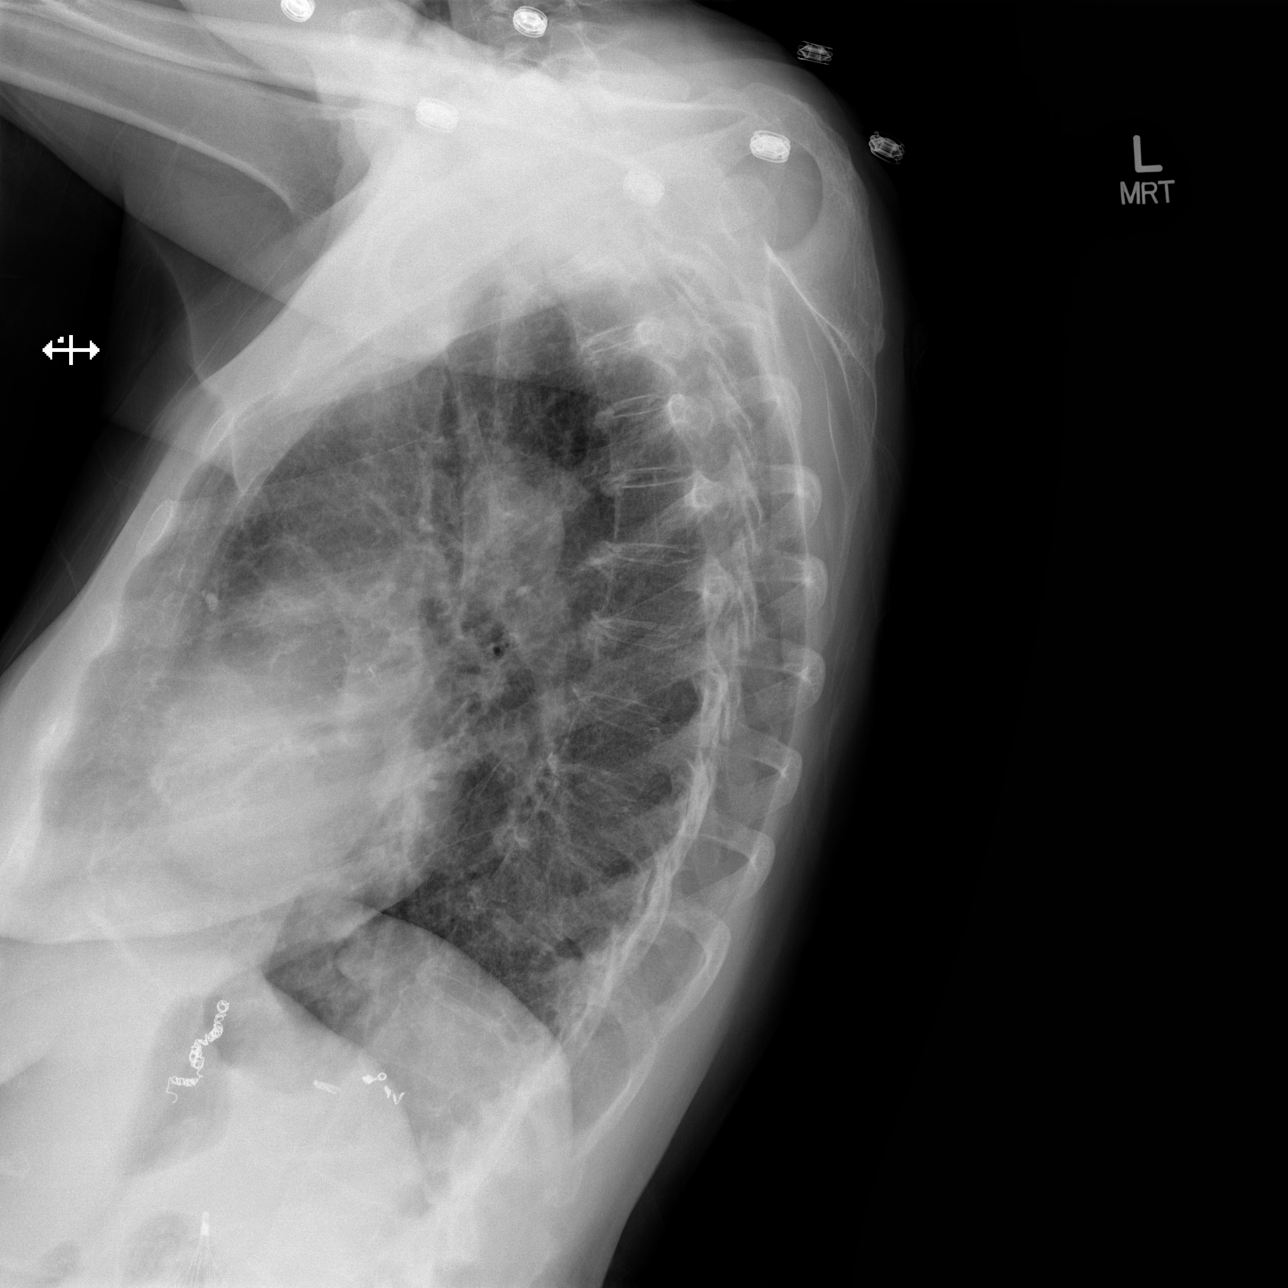

[w chest lat (2 of 2)]
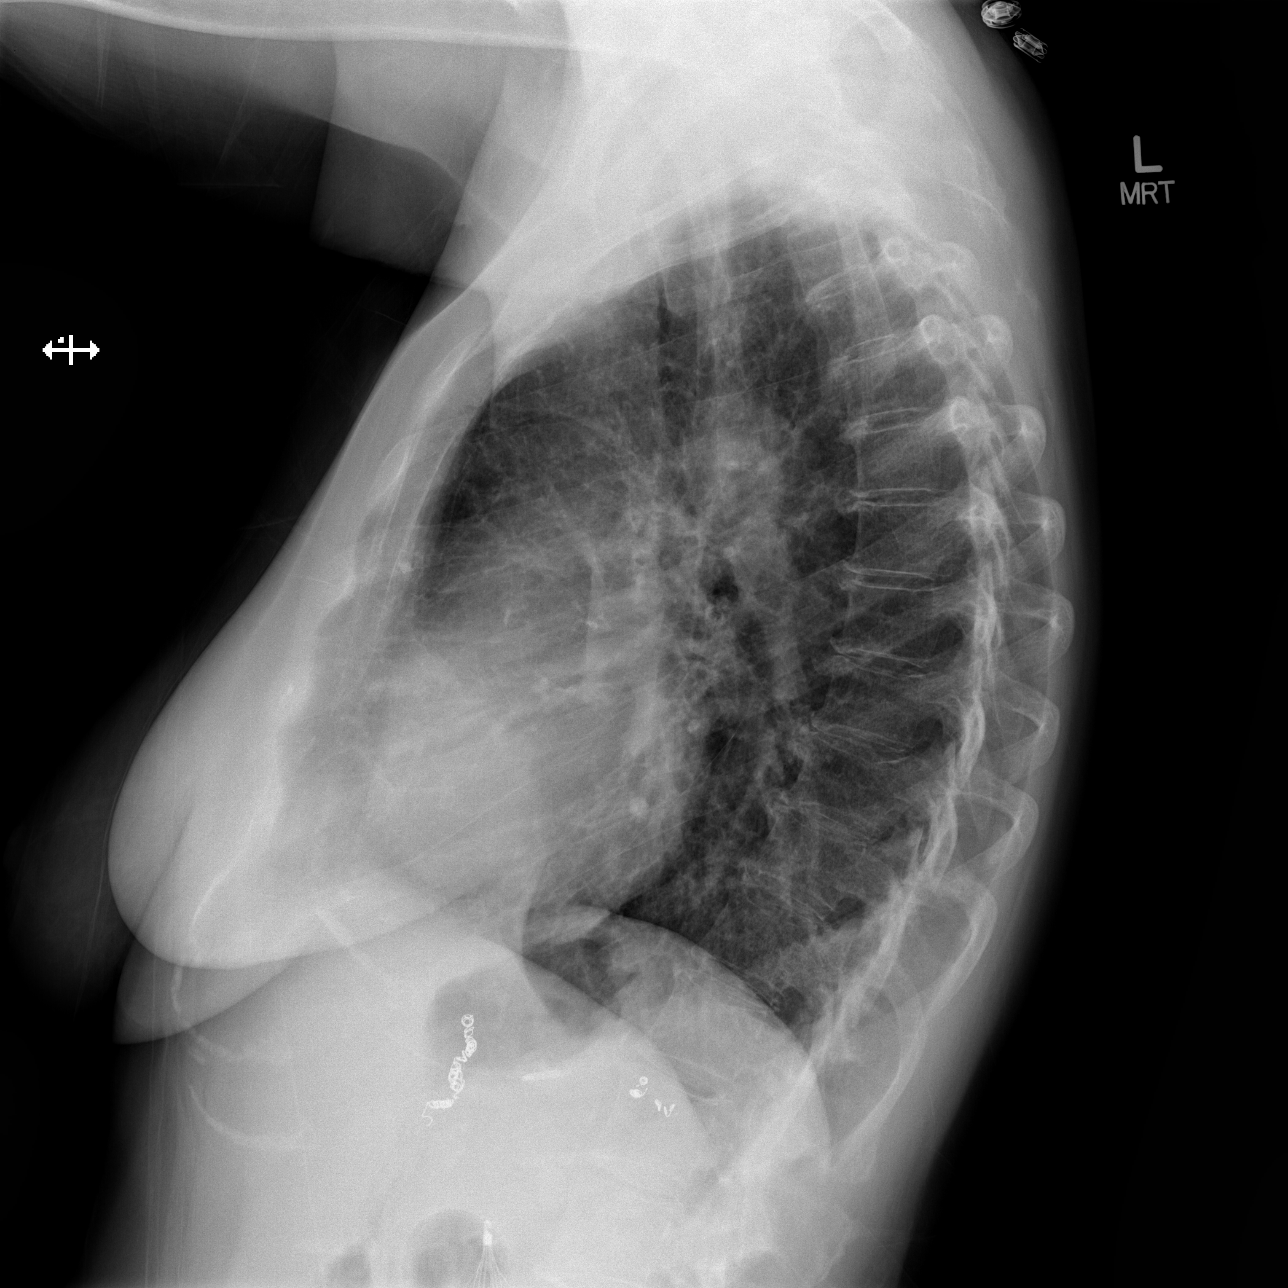

[3 of 3 positions shown; findings below may reference images not displayed]

FINDINGS: There is subtle infiltrate in the left upper lobe, less apparent
than on recent PET study. Lungs elsewhere are clear. Heart size and
pulmonary vascularity are normal. There is a mass on the left at the
level of the aortopulmonary window, better appreciated on CT. This
lesion measures approximately 4.7 x 3.8 cm. No similar mass or
adenopathy elsewhere.

Postoperative changes are noted in the upper abdomen. No bone
lesions.
IMPRESSION: Mass in the middle mediastinum, better seen on recent PET study.
Subtle infiltrate left upper lobe, best prominent than on recent PET
study. Lungs elsewhere clear. Heart size normal.

## 2018-05-11 ENCOUNTER — Other Ambulatory Visit: Payer: PPO

## 2018-05-12 ENCOUNTER — Telehealth: Payer: Self-pay | Admitting: *Deleted

## 2018-05-12 NOTE — Telephone Encounter (Signed)
Called to state she had not heard from GI doctor after Dr. Irene Limbo referred her at last appt. Referral placed by Dr. Irene Limbo to Dr. Owens Loffler ([facility])  on 2/27. Gave patient phone number for Dr. Ardis Hughs # [phone].  Also shared CT results per Dr. Irene Limbo directions: CT chest showed stable NSCLC. Esophageal thickening noted, will refer back to GI for consideration of EGD.  Patient verbalized understanding of information. Encouraged to contact office for questions or concerns.

## 2018-05-18 ENCOUNTER — Other Ambulatory Visit: Payer: PPO

## 2018-05-18 ENCOUNTER — Inpatient Hospital Stay: Payer: PPO

## 2018-05-18 ENCOUNTER — Ambulatory Visit: Payer: PPO | Admitting: Hematology

## 2018-05-18 ENCOUNTER — Other Ambulatory Visit: Payer: Self-pay

## 2018-05-18 ENCOUNTER — Inpatient Hospital Stay: Payer: PPO | Attending: Hematology

## 2018-05-18 VITALS — BP 126/44 | HR 52 | Temp 97.6°F | Resp 18

## 2018-05-18 DIAGNOSIS — C3490 Malignant neoplasm of unspecified part of unspecified bronchus or lung: Secondary | ICD-10-CM | POA: Diagnosis not present

## 2018-05-18 DIAGNOSIS — Z95828 Presence of other vascular implants and grafts: Secondary | ICD-10-CM

## 2018-05-18 DIAGNOSIS — C383 Malignant neoplasm of mediastinum, part unspecified: Secondary | ICD-10-CM

## 2018-05-18 DIAGNOSIS — Z5112 Encounter for antineoplastic immunotherapy: Secondary | ICD-10-CM | POA: Diagnosis not present

## 2018-05-18 DIAGNOSIS — Z87891 Personal history of nicotine dependence: Secondary | ICD-10-CM | POA: Diagnosis not present

## 2018-05-18 DIAGNOSIS — Z8249 Family history of ischemic heart disease and other diseases of the circulatory system: Secondary | ICD-10-CM | POA: Insufficient documentation

## 2018-05-18 LAB — CBC WITH DIFFERENTIAL/PLATELET
Abs Immature Granulocytes: 0.02 10*3/uL (ref 0.00–0.07)
Basophils Absolute: 0 10*3/uL (ref 0.0–0.1)
Basophils Relative: 1 %
Eosinophils Absolute: 0.7 10*3/uL — ABNORMAL HIGH (ref 0.0–0.5)
Eosinophils Relative: 12 %
HCT: 33.8 % — ABNORMAL LOW (ref 36.0–46.0)
Hemoglobin: 11.1 g/dL — ABNORMAL LOW (ref 12.0–15.0)
Immature Granulocytes: 0 %
Lymphocytes Relative: 22 %
Lymphs Abs: 1.3 10*3/uL (ref 0.7–4.0)
MCH: 31.7 pg (ref 26.0–34.0)
MCHC: 32.8 g/dL (ref 30.0–36.0)
MCV: 96.6 fL (ref 80.0–100.0)
Monocytes Absolute: 0.6 10*3/uL (ref 0.1–1.0)
Monocytes Relative: 10 %
Neutro Abs: 3.3 10*3/uL (ref 1.7–7.7)
Neutrophils Relative %: 55 %
Platelets: 229 10*3/uL (ref 150–400)
RBC: 3.5 MIL/uL — ABNORMAL LOW (ref 3.87–5.11)
RDW: 13.7 % (ref 11.5–15.5)
WBC: 5.9 10*3/uL (ref 4.0–10.5)
nRBC: 0 % (ref 0.0–0.2)

## 2018-05-18 LAB — CMP (CANCER CENTER ONLY)
ALT: 11 U/L (ref 0–44)
AST: 15 U/L (ref 15–41)
Albumin: 3.5 g/dL (ref 3.5–5.0)
Alkaline Phosphatase: 66 U/L (ref 38–126)
Anion gap: 10 (ref 5–15)
BUN: 34 mg/dL — ABNORMAL HIGH (ref 8–23)
CO2: 23 mmol/L (ref 22–32)
Calcium: 9.4 mg/dL (ref 8.9–10.3)
Chloride: 107 mmol/L (ref 98–111)
Creatinine: 0.76 mg/dL (ref 0.44–1.00)
GFR, Est AFR Am: >60 mL/min (ref >60)
GFR, Estimated: >60 mL/min (ref >60)
Glucose, Bld: 95 mg/dL (ref 70–99)
Potassium: 3.5 mmol/L (ref 3.5–5.1)
Sodium: 140 mmol/L (ref 135–145)
Total Bilirubin: 0.3 mg/dL (ref 0.3–1.2)
Total Protein: 6.6 g/dL (ref 6.5–8.1)

## 2018-05-18 MED ORDER — HEPARIN SOD (PORK) LOCK FLUSH 100 UNIT/ML IV SOLN
500.0000 [IU] | Freq: Once | INTRAVENOUS | Status: AC | PRN
  Administered 2018-05-18: 500 [IU]
  Filled 2018-05-18: qty 5

## 2018-05-18 MED ORDER — SODIUM CHLORIDE 0.9 % IV SOLN
Freq: Once | INTRAVENOUS | Status: AC
  Administered 2018-05-18: 10:00:00 via INTRAVENOUS
  Filled 2018-05-18: qty 250

## 2018-05-18 MED ORDER — SODIUM CHLORIDE 0.9 % IV SOLN
10.3000 mg/kg | Freq: Once | INTRAVENOUS | Status: AC
  Administered 2018-05-18: 620 mg via INTRAVENOUS
  Filled 2018-05-18: qty 10

## 2018-05-18 MED ORDER — SODIUM CHLORIDE 0.9% FLUSH
10.0000 mL | INTRAVENOUS | Status: DC | PRN
  Administered 2018-05-18: 10 mL
  Filled 2018-05-18: qty 10

## 2018-05-18 NOTE — Patient Instructions (Signed)
[facility] Discharge Instructions for Patients Receiving Chemotherapy  Today you received the following chemotherapy agents Durvalumab (IMFINZI).  To help prevent nausea and vomiting after your treatment, we encourage you to take your nausea medication as prescribed.   If you develop nausea and vomiting that is not controlled by your nausea medication, call the clinic.   BELOW ARE SYMPTOMS THAT SHOULD BE REPORTED IMMEDIATELY:  *FEVER GREATER THAN 100.5 F  *CHILLS WITH OR WITHOUT FEVER  NAUSEA AND VOMITING THAT IS NOT CONTROLLED WITH YOUR NAUSEA MEDICATION  *UNUSUAL SHORTNESS OF BREATH  *UNUSUAL BRUISING OR BLEEDING  TENDERNESS IN MOUTH AND THROAT WITH OR WITHOUT PRESENCE OF ULCERS  *URINARY PROBLEMS  *BOWEL PROBLEMS  UNUSUAL RASH Items with * indicate a potential emergency and should be followed up as soon as possible.  Feel free to call the clinic should you have any questions or concerns. The clinic phone number is [phone].  Please show the [place] at check-in to the Emergency Department and triage nurse.

## 2018-05-19 DIAGNOSIS — C3490 Malignant neoplasm of unspecified part of unspecified bronchus or lung: Secondary | ICD-10-CM | POA: Diagnosis not present

## 2018-05-19 DIAGNOSIS — K222 Esophageal obstruction: Secondary | ICD-10-CM | POA: Diagnosis not present

## 2018-05-19 DIAGNOSIS — R1310 Dysphagia, unspecified: Secondary | ICD-10-CM | POA: Diagnosis not present

## 2018-05-27 MED ORDER — PALONOSETRON HCL INJECTION 0.25 MG/5ML
INTRAVENOUS | Status: AC
  Filled 2018-05-27: qty 5

## 2018-05-31 NOTE — Progress Notes (Signed)
[name]    HEMATOLOGY/ONCOLOGY CLINIC NOTE  Date of Service: 06/01/2018   Patient Care Team: [name], MD as PCP - General (Family Medicine)  CHIEF COMPLAINTS/PURPOSE OF CONSULTATION:  F/u for continued management of Non-Small Cell lung Carcinoma  HISTORY OF PRESENTING ILLNESS:   Kayla Wyatt is a wonderful 62 y.o. female who has been referred to [place] by Dr [name] for evaluation and management of Non-Small Cell Carcinoma. She is accompanied today by her husband. The pt reports that she is doing well overall.   Patient recently had a significant hospitalization on 04/02/2017 - as per DC summary by [name] " with abdominal pain, hematemesis, melena and ABLA due to GIB.  She underwent clipping of gastric ulcer and received multiple units PRBC but continued to have drop in H/H with bloody stool. NM GI scan was negative for bleeding and she underwent colonoscopy by Dr. [name] 1/31 showing diverticulosis but no signs of bleeding. Hospital course significant for R- gastrocnemius DVT, PAF, incidental findings of mediastinal mass as well as brief episode of hematuria. She was cleared to start on Xarelto on 02/01 for treatment of DVT. On 2/2 am she developed hematemesis with maroon stools and hypotension requiring fluid bolus as well as 2 units PRBCs. She continued to decline requiring intubation, pressors as well as reversal of anticoagulation with Kcentra on 02/3. She underwent UGI revealing large clot with fresh blood in stomach and underwent mesenteric arteriogram with percutaneous coil embolization fo distal tributary of left gastric artery and placement of IVC filter by interventional radiology.   A fib with RVR felt to be due to hemorrhagic shock and she converted to NSR. [name] felt no further cardiac work up indicated and did not recommend initiating anticoagulation in short term. To follow up with [name] after PET scan and routine biopsy after medically stable. She  continued to have bleeding and underwent visceral angiogram with embolization of inferior division of splenic artery and associated gastric arteries on 2/4. No surgical intervention needed per [name] with recommendations to continue monitoring H/H as well as for recurrence of hematochezia. She tolerated extubation on 2/6 and respiratory status improving.  On 2/11, she has episode of unresponsiveness with jerking of bilateral limbs that lasted about 3 minutes. She had amnesia of events but was back to baseline. EEG revealed "focal slowing over the right temporo-occipital regionandoccasional epileptiform discharges over the right occipital region". Head CT reviewed, unremarkable for acute intracranial process. MRI brain done revealing mild biparietal signal abnormality suspicious for PRES. [name] recommended starting patient on Keppra BID with repeat MRI in 2 weeks. New onset seizure likely provoked by PRES--if resolved--Ok to take patient off Keppra. Patient with resultant generalized weakness. CIR recommended due to functional deficits. "   Later as outpatient she had a PET scan on 05/14/17 which showed a hypermetabolic mediastinal mass which was subsequently biopsied with a bronchoscopy on 05/19/17. She notes that she had smoked for about 35-40 years, half a pack of a day. She reports that she has not had any other symptoms related to her smoking; she has never had to be on home oxygen or inhalers. She has recently begun Symbicort. While being in the hospital she lost about 20 lbs but has maintained her weight since being discharged.  The pt and her husband note that she is concerned about what to do if she is not able to go back to work after her 90-day leave. We recommended that they speak with  our social workers here to explore the available options for insurance coverage /disability application etc.  Of note prior to the patient's visit today, pt has had PET/CT completed on 05/14/17 with  results revealing 1. The known mediastinal mass is intensely hypermetabolic, worrisome for malignancy. This could reflect lymphoma or small cell lung cancer. Tissue sampling recommended.2. No peripheral lung mass identified. New patchy ground-glass opacity in the lingula is mildly hypermetabolic and likely inflammatory.  On 05/19/17 the pt had a Fine Needle Aspiration which revealed Non-Small cell lung carcinoma.  Most recent lab results (05/19/17) of CBC  is as follows: all values are WNL except for RBC at 2.90, Hgb at 8.6, HCT at 28.0, RDW at 16.4.  On review of systems, pt reports knee pain, and denies abdominal pains, leg swelling, SOB, CP, and any other symptoms.   On PMHx the pt reports acute blood loss anemia, GI hemorrhage with melena, hypercholesteremia, HTN, persistent atrial fibrillation with rapid ventricular response, Vitamin D deficiency, denies liver and kidney problems. On Social Hx the pt reports having smoked for 35-40 years of half a pack per day. She quit smoking on 03/31/17.  On Family Hx the pt reports that her father had heart disease.  Interval History:   Kayla Wyatt returns today regarding her Non-Small Cell Carcinoma. The patient's last visit with [place] was on 05/04/18. The pt reports that she is doing well overall.   The pt reports that she is anticipating her endoscopy on April 14. She denies any abdominal pains or changes in her bowel habits. She denies SOB, changes in breathing, fevers or chills. She notes that she is tolerating her maintenance treatment well and denies diarrhea or skin rashes.  Lab results today (06/01/18) of CBC w/diff is as follows: all values are WNL except for RBC at 3.58, HGB at 11.4, HCT at 34.7 06/01/18 CMP is pending.  On review of systems, pt reports good energy levels, eating well, stable weight, and denies fevers, chills, abdominal pains, changes in bowel habits, changes in breathing, SOB, leg swelling, skin rashes, diarrhea, and any other  symptoms.   MEDICAL HISTORY:  Past Medical History:  Diagnosis Date  . Acute blood loss anemia 04/02/2017  . Anxiety   . Gastrointestinal hemorrhage with melena   . Headache   . Hypercholesteremia   . Hypertension   . Persistent atrial fibrillation with rapid ventricular response 04/07/2017  . Seizures ([place])    04/2017 while in hospital at [facility]  . Vitamin D deficiency     SURGICAL HISTORY: Past Surgical History:  Procedure Laterality Date  . COLONOSCOPY Left 04/09/2017   Procedure: COLONOSCOPY;  Surgeon: Carol Ada, MD;  Location: [place];  Service: Endoscopy;  Laterality: Left;  . ESOPHAGOGASTRODUODENOSCOPY N/A 04/03/2017   Procedure: ESOPHAGOGASTRODUODENOSCOPY (EGD);  Surgeon: Carol Ada, MD;  Location: [place];  Service: Endoscopy;  Laterality: N/A;  . ESOPHAGOGASTRODUODENOSCOPY (EGD) WITH PROPOFOL N/A 04/12/2017   Procedure: ESOPHAGOGASTRODUODENOSCOPY (EGD) WITH PROPOFOL;  Surgeon: Milus Banister, MD;  Location: [facility] END[facility]COPY;  Service: Endoscopy;  Laterality: N/A;  . IR ANGIOGRAM SELECTIVE EACH ADDITIONAL VESSEL  04/12/2017  . IR ANGIOGRAM SELECTIVE EACH ADDITIONAL VESSEL  04/13/2017  . IR ANGIOGRAM SELECTIVE EACH ADDITIONAL VESSEL  04/13/2017  . IR ANGIOGRAM SELECTIVE EACH ADDITIONAL VESSEL  04/13/2017  . IR ANGIOGRAM VISCERAL SELECTIVE  04/12/2017  . IR ANGIOGRAM VISCERAL SELECTIVE  04/13/2017  . IR EMBO ART  VEN HEMORR LYMPH EXTRAV  INC GUIDE ROADMAPPING  04/12/2017  . IR EMBO ART  VEN HEMORR LYMPH EXTRAV  INC GUIDE ROADMAPPING  04/13/2017  . IR FLUORO GUIDE CV LINE RIGHT  04/13/2017  . IR FLUORO GUIDE PORT INSERTION RIGHT  07/27/2017  . IR IVC FILTER PLMT / S&I /IMG GUID/MOD SED  04/12/2017  . IR US GUIDE VASC ACCESS RIGHT  04/12/2017  . IR US GUIDE VASC ACCESS RIGHT  04/13/2017  . IR US GUIDE VASC ACCESS RIGHT  04/13/2017  . IR US GUIDE VASC ACCESS RIGHT  07/27/2017  . LEG SURGERY  1974   Blood Clot Removal   . VIDEO BRONCHOSCOPY WITH ENDOBRONCHIAL ULTRASOUND N/A 05/19/2017    Procedure: VIDEO BRONCHOSCOPY WITH ENDOBRONCHIAL ULTRASOUND;  Surgeon: Grace Isaac, MD;  Location: [place];  Service: Thoracic;  Laterality: N/A;    SOCIAL HISTORY: Social History   Socioeconomic History  . Marital status: Single    Spouse name: Not on file  . Number of children: Not on file  . Years of education: Not on file  . Highest education level: Not on file  Occupational History  . Occupation: Has to lift heavy boxes at times.    Employer: [employer]  Social Needs  . Financial resource strain: Not on file  . Food insecurity:    Worry: Not on file    Inability: Not on file  . Transportation needs:    Medical: Not on file    Non-medical: Not on file  Tobacco Use  . Smoking status: Former Smoker    Packs/day: 0.50    Years: 40.00    Pack years: 20.00    Types: Cigarettes    Last attempt to quit: 03/31/2017    Years since quitting: 1.1  . Smokeless tobacco: Never Used  Substance and Sexual Activity  . Alcohol use: No    Frequency: Never  . Drug use: No  . Sexual activity: Not Currently  Lifestyle  . Physical activity:    Days per week: Not on file    Minutes per session: Not on file  . Stress: Not on file  Relationships  . Social connections:    Talks on phone: Not on file    Gets together: Not on file    Attends religious service: Not on file    Active member of club or organization: Not on file    Attends meetings of clubs or organizations: Not on file    Relationship status: Not on file  . Intimate partner violence:    Fear of current or ex partner: No    Emotionally abused: No    Physically abused: No    Forced sexual activity: No  Other Topics Concern  . Not on file  Social History Narrative  . Not on file    FAMILY HISTORY: Family History  Problem Relation Age of Onset  . Heart disease Father     ALLERGIES:  has No Known Allergies.  MEDICATIONS:  Current Outpatient Medications  Medication Sig Dispense Refill  . acetaminophen  (TYLENOL) 325 MG tablet Take 1-2 tablets (325-650 mg total) by mouth every 4 (four) hours as needed for mild pain.    . budesonide-formoterol (SYMBICORT) 80-4.5 MCG/ACT inhaler Inhale 2 puffs into the lungs 2 (two) times daily. 1 Inhaler 12  . cholecalciferol (VITAMIN D) 1000 units tablet Take 1,000 Units by mouth 2 (two) times daily.    [name] levETIRAcetam (KEPPRA) 500 MG tablet TK 1 T PO BID  6  . lidocaine-prilocaine (EMLA) cream Apply to affected area once 30 g 3  .  lisinopril-hydrochlorothiazide (PRINZIDE,ZESTORETIC) 20-12.5 MG tablet Take 1 tablet by mouth daily.    [name] LORazepam (ATIVAN) 0.5 MG tablet Take 2 tablets (1 mg total) by mouth once as needed for up to 1 dose (prior to PET/CT and MRI brain for anxiety). 6 tablet 0  . ondansetron (ZOFRAN) 8 MG tablet Take 1 tablet (8 mg total) by mouth 2 (two) times daily as needed for refractory nausea / vomiting. Start on day 3 after chemo. 30 tablet 1  . pantoprazole (PROTONIX) 40 MG tablet TK 1 T PO BID  6  . polycarbophil (FIBERCON) 625 MG tablet Take 2 tablets (1,250 mg total) by mouth 2 (two) times daily. 120 tablet 0  . potassium chloride SA (K-DUR,KLOR-CON) 20 MEQ tablet Take 1 tablet (20 mEq total) by mouth 2 (two) times daily. 60 tablet 0  . prochlorperazine (COMPAZINE) 10 MG tablet Take 1 tablet (10 mg total) by mouth every 6 (six) hours as needed (Nausea or vomiting). 30 tablet 1  . saccharomyces boulardii (FLORASTOR) 250 MG capsule Take 1 capsule (250 mg total) by mouth 2 (two) times daily. 100 capsule 0  . sucralfate (CARAFATE) 1 g tablet Crush and mix in 10-15 mL of water prior to swallowing qAC and HS 120 tablet 1   No current facility-administered medications for this visit.    Facility-Administered Medications Ordered in Other Visits  Medication Dose Route Frequency Provider Last Rate Last Dose  . 0.9 %  sodium chloride infusion   Intravenous Once Brunetta Genera, MD      . durvalumab (IMFINZI) 600 mg in sodium chloride 0.9 % 100  mL chemo infusion  10 mg/kg (Treatment Plan Recorded) Intravenous Once Brunetta Genera, MD      . heparin lock flush 100 unit/mL  500 Units Intracatheter Once PRN Brunetta Genera, MD      . sodium chloride flush (NS) 0.9 % injection 10 mL  10 mL Intracatheter PRN Brunetta Genera, MD        REVIEW OF SYSTEMS:    A 10+ POINT REVIEW OF SYSTEMS WAS OBTAINED including neurology, dermatology, psychiatry, cardiac, respiratory, lymph, extremities, GI, GU, Musculoskeletal, constitutional, breasts, reproductive, HEENT.  All pertinent positives are noted in the HPI.  All others are negative.   PHYSICAL EXAMINATION: ECOG PERFORMANCE STATUS: 1 - Symptomatic but completely ambulatory  VS reviewed in EPIC  GENERAL:alert, in no acute distress and comfortable SKIN: no acute rashes, no significant lesions EYES: conjunctiva are pink and non-injected, sclera anicteric OROPHARYNX: MMM, no exudates, no oropharyngeal erythema or ulceration NECK: supple, no JVD LYMPH:  no palpable lymphadenopathy in the cervical, axillary or inguinal regions LUNGS: clear to auscultation b/l with normal respiratory effort HEART: regular rate & rhythm ABDOMEN:  normoactive bowel sounds , non tender, not distended. No palpable hepatosplenomegaly.  Extremity: no pedal edema PSYCH: alert & oriented x 3 with fluent speech NEURO: no focal motor/sensory deficits   LABORATORY DATA:  I have reviewed the data as listed  . CBC Latest Ref Rng & Units 06/01/2018 05/18/2018 05/04/2018  WBC 4.0 - 10.5 K/uL 6.0 5.9 6.7  Hemoglobin 12.0 - 15.0 g/dL 11.4(L) 11.1(L) 11.6(L)  Hematocrit 36.0 - 46.0 % 34.7(L) 33.8(L) 35.4(L)  Platelets 150 - 400 K/uL 244 229 229    . CMP Latest Ref Rng & Units 06/01/2018 05/18/2018 05/04/2018  Glucose 70 - 99 mg/dL 99 95 98  BUN 8 - 23 mg/dL 26(H) 34(H) 36(H)  Creatinine 0.44 - 1.00 mg/dL 0.82 0.76 0.79  Sodium  135 - 145 mmol/L 142 140 140  Potassium 3.5 - 5.1 mmol/L 3.6 3.5 4.0  Chloride 98  - 111 mmol/L 106 107 106  CO2 22 - 32 mmol/L _0 Calcium 8.9 - 10.3 mg/dL 9.3 9.4 9.4  Total Protein 6.5 - 8.1 g/dL 6.9 6.6 7.0  Total Bilirubin 0.3 - 1.2 mg/dL 0.4 0.3 0.4  Alkaline Phos 38 - 126 U/L 69 66 74  AST 15 - 41 U/L _1 ALT 0 - 44 U/L _2 Component     Latest Ref Rng & Units 12/28/2017  Magnesium     1.7 - 2.4 mg/dL 1.6 (L)   05/19/17 Fine Needle Aspiration:  05/19/17 Bronchial Washing Specimen B:   RADIOGRAPHIC STUDIES: I have personally reviewed the radiological images as listed and agreed with the findings in the report. No results found. MRI brain 04/21/2017: IMPRESSION: 1. Motion degraded examination. 2. 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 3. Otherwise negative MRI of the head with and without contrast for age.  Electronically Signed   By: Elon Alas M.D.   On: 04/22/2017 00:34    ASSESSMENT & PLAN:   63 y.o. female with  1. Recently diagnosed Non-Small Cell Carcinoma on bronchoscopy/cytology.  Not enough tissue for further characterization or mutation testing. Presenting with mediastinal nodal mass - TxN2 Mx (Atleast Stage III disease) ? Lingular primary vs inflammation. No overt evidence of metastatic disease.  -EGFR mutation studies on blood negative. Patient is s/p 3 cycles of Carboplatin/taxol -resolution left upper lobe ground-glass opacity has resolved in the interval. 08/19/17 PET revealed  No significant change in size of hypermetabolic AP window mass. The degree of FDG uptake is slightly increased in the interval. 2. Persistent hypermetabolic focus involving the distal aspect of the tongue within SUV max of 16.46. Similar to previous exam. (no clinical co-relate). Lung involvement has decreased.   08/19/17 Brain MRI revealed No evidence of intracranial metastases. Interval resolution of bilateral parietal signal abnormality favoring PRES  Completed concurrent  Carbo/taxol chemotherapy + RT  12/24/17 PET/CT revealed Marked interval response to therapy with large AP window lymph nodes seen on the previous study almost imperceptible on today's exam. Hypermetabolism within this lesion has resolved with FDG accumulation now at background blood pool levels. 2. Small focus of hypermetabolism in the anal region, similar to prior and indeterminate by PET imaging. 3. Hypermetabolic focus at the distal tongue has decreased in the Interval. 4. Marked pelvic floor laxity with cystocele. 5. Stable tiny left lung nodules. 6.  Aortic Atherosclerois. 7.  Emphysema.   04/03/17 endoscopy did not reveal esophageal thickening    04/27/18 CT Chest revealed Stable exam. No new or progressive findings. 2. Stable mild circumferential wall thickening mid esophagus. Esophagitis is a consideration. 3. Stable tiny left pulmonary nodules. 4.  Emphysema. 5.  Aortic Atherosclerois.  2. Bone Pain from neulasta - managed with Tylenol  3. Previous h/ot SZ -currently on Keppra. MRI brain - resolution of findings of PRES  4. H/o rt calf DVT - off anticoagulation due to massive GI bleeding. S/p IVC filter.  5. S/p previous GI bleeding from Gastric ulcer --needing clipping and arterial embolization. hgb normal at 10.4  today. On PPI BID  6. Low Magnesium Mg 1.6 -On oral magnesium 247m BID  7. H/o ctx /steroid related thrush --now resolved.   PLAN:  -Discussed pt labwork today, 06/01/18; blood counts are stable -The pt has no prohibitive  toxicities from continuing tenth maintenance Durvalumab, every 2 weeks, at this time.  -Follow up with GI Dr. Ardis Hughs and repeat endoscopy on 06/22/18 for evaluation of circumferential wall thickening, as the pt does have a smoking history. -Esophageal thickening could be due to acid reflux or previous radiation, or less likely a new primary malignancy  -Recommend avoiding acidic foods, and not lying down after eating -Pt will check BP readings at home,  first thing in the morning, for consideration of restarting her Lisinopril  -Recommended that the pt stay up to date with annual flu vaccine, both 5-year pneumonia vaccines, and Shingrix  -Recommended that the pt continue to eat well, drink at least 48-64 oz of water each day, and walk 20-30 minutes each day.  -Advised crowd avoidance and infection prevention strategies -Will see the pt back in 4 weeks   F/U As per currently scheduled appointments in April 2020   All of the patients questions were answered with apparent satisfaction. The patient knows to call the clinic with any problems, questions or concerns.  The total time spent in the appt was 20 minutes and more than 50% was on counseling and direct patient cares.   Sullivan Lone MD Kayla AAHIVMS [facility]ematology[facility]On[facility]  (Office):       [phone] (Work cell):  [phone] (Fax):           [phone]  06/01/2018 1:45 PM  I, Baldwin [place], am acting as a scribe for Dr. Sullivan Lone.   .I have reviewed the above documentation for accuracy and completeness, and I agree with the above. Brunetta Genera MD

## 2018-06-01 ENCOUNTER — Inpatient Hospital Stay: Payer: PPO

## 2018-06-01 ENCOUNTER — Other Ambulatory Visit: Payer: Self-pay

## 2018-06-01 ENCOUNTER — Inpatient Hospital Stay (HOSPITAL_BASED_OUTPATIENT_CLINIC_OR_DEPARTMENT_OTHER): Payer: PPO | Admitting: Hematology

## 2018-06-01 ENCOUNTER — Telehealth: Payer: Self-pay | Admitting: Hematology

## 2018-06-01 VITALS — BP 124/42 | HR 72 | Temp 98.3°F | Resp 18 | Ht 63.0 in | Wt 145.1 lb

## 2018-06-01 DIAGNOSIS — Z87891 Personal history of nicotine dependence: Secondary | ICD-10-CM | POA: Diagnosis not present

## 2018-06-01 DIAGNOSIS — Z8249 Family history of ischemic heart disease and other diseases of the circulatory system: Secondary | ICD-10-CM | POA: Diagnosis not present

## 2018-06-01 DIAGNOSIS — Z5112 Encounter for antineoplastic immunotherapy: Secondary | ICD-10-CM

## 2018-06-01 DIAGNOSIS — Z5111 Encounter for antineoplastic chemotherapy: Secondary | ICD-10-CM

## 2018-06-01 DIAGNOSIS — C383 Malignant neoplasm of mediastinum, part unspecified: Secondary | ICD-10-CM

## 2018-06-01 DIAGNOSIS — C3490 Malignant neoplasm of unspecified part of unspecified bronchus or lung: Secondary | ICD-10-CM

## 2018-06-01 DIAGNOSIS — Z95828 Presence of other vascular implants and grafts: Secondary | ICD-10-CM

## 2018-06-01 LAB — CMP (CANCER CENTER ONLY)
ALT: 13 U/L (ref 0–44)
AST: 17 U/L (ref 15–41)
Albumin: 3.6 g/dL (ref 3.5–5.0)
Alkaline Phosphatase: 69 U/L (ref 38–126)
Anion gap: 12 (ref 5–15)
BUN: 26 mg/dL — ABNORMAL HIGH (ref 8–23)
CO2: 24 mmol/L (ref 22–32)
Calcium: 9.3 mg/dL (ref 8.9–10.3)
Chloride: 106 mmol/L (ref 98–111)
Creatinine: 0.82 mg/dL (ref 0.44–1.00)
GFR, Est AFR Am: >60 mL/min (ref >60)
GFR, Estimated: >60 mL/min (ref >60)
Glucose, Bld: 99 mg/dL (ref 70–99)
Potassium: 3.6 mmol/L (ref 3.5–5.1)
Sodium: 142 mmol/L (ref 135–145)
Total Bilirubin: 0.4 mg/dL (ref 0.3–1.2)
Total Protein: 6.9 g/dL (ref 6.5–8.1)

## 2018-06-01 LAB — CBC WITH DIFFERENTIAL/PLATELET
Abs Immature Granulocytes: 0.02 10*3/uL (ref 0.00–0.07)
Basophils Absolute: 0 10*3/uL (ref 0.0–0.1)
Basophils Relative: 0 %
Eosinophils Absolute: 0.3 10*3/uL (ref 0.0–0.5)
Eosinophils Relative: 6 %
HCT: 34.7 % — ABNORMAL LOW (ref 36.0–46.0)
Hemoglobin: 11.4 g/dL — ABNORMAL LOW (ref 12.0–15.0)
Immature Granulocytes: 0 %
Lymphocytes Relative: 24 %
Lymphs Abs: 1.4 10*3/uL (ref 0.7–4.0)
MCH: 31.8 pg (ref 26.0–34.0)
MCHC: 32.9 g/dL (ref 30.0–36.0)
MCV: 96.9 fL (ref 80.0–100.0)
Monocytes Absolute: 0.5 10*3/uL (ref 0.1–1.0)
Monocytes Relative: 9 %
Neutro Abs: 3.7 10*3/uL (ref 1.7–7.7)
Neutrophils Relative %: 61 %
Platelets: 244 10*3/uL (ref 150–400)
RBC: 3.58 MIL/uL — ABNORMAL LOW (ref 3.87–5.11)
RDW: 14 % (ref 11.5–15.5)
WBC: 6 10*3/uL (ref 4.0–10.5)
nRBC: 0 % (ref 0.0–0.2)

## 2018-06-01 MED ORDER — SODIUM CHLORIDE 0.9 % IV SOLN
Freq: Once | INTRAVENOUS | Status: AC
  Administered 2018-06-01: 14:00:00 via INTRAVENOUS
  Filled 2018-06-01: qty 250

## 2018-06-01 MED ORDER — SODIUM CHLORIDE 0.9 % IV SOLN
620.0000 mg | Freq: Once | INTRAVENOUS | Status: AC
  Administered 2018-06-01: 620 mg via INTRAVENOUS
  Filled 2018-06-01: qty 10

## 2018-06-01 MED ORDER — SODIUM CHLORIDE 0.9% FLUSH
10.0000 mL | INTRAVENOUS | Status: DC | PRN
  Administered 2018-06-01: 10 mL
  Filled 2018-06-01: qty 10

## 2018-06-01 MED ORDER — HEPARIN SOD (PORK) LOCK FLUSH 100 UNIT/ML IV SOLN
500.0000 [IU] | Freq: Once | INTRAVENOUS | Status: AC | PRN
  Administered 2018-06-01: 500 [IU]
  Filled 2018-06-01: qty 5

## 2018-06-01 NOTE — Patient Instructions (Signed)
[facility] Discharge Instructions for Patients Receiving Chemotherapy  Today you received the following chemotherapy agents Durvalumab (IMFINZI).  To help prevent nausea and vomiting after your treatment, we encourage you to take your nausea medication as prescribed.   If you develop nausea and vomiting that is not controlled by your nausea medication, call the clinic.   BELOW ARE SYMPTOMS THAT SHOULD BE REPORTED IMMEDIATELY:  *FEVER GREATER THAN 100.5 F  *CHILLS WITH OR WITHOUT FEVER  NAUSEA AND VOMITING THAT IS NOT CONTROLLED WITH YOUR NAUSEA MEDICATION  *UNUSUAL SHORTNESS OF BREATH  *UNUSUAL BRUISING OR BLEEDING  TENDERNESS IN MOUTH AND THROAT WITH OR WITHOUT PRESENCE OF ULCERS  *URINARY PROBLEMS  *BOWEL PROBLEMS  UNUSUAL RASH Items with * indicate a potential emergency and should be followed up as soon as possible.  Feel free to call the clinic should you have any questions or concerns. The clinic phone number is [phone].  Please show the [place] at check-in to the Emergency Department and triage nurse.

## 2018-06-01 NOTE — Telephone Encounter (Signed)
Per 3/24 los f/u as scheduled appt.

## 2018-06-09 DIAGNOSIS — E559 Vitamin D deficiency, unspecified: Secondary | ICD-10-CM | POA: Diagnosis not present

## 2018-06-09 DIAGNOSIS — C3490 Malignant neoplasm of unspecified part of unspecified bronchus or lung: Secondary | ICD-10-CM | POA: Diagnosis not present

## 2018-06-09 DIAGNOSIS — I10 Essential (primary) hypertension: Secondary | ICD-10-CM | POA: Diagnosis not present

## 2018-06-09 DIAGNOSIS — R569 Unspecified convulsions: Secondary | ICD-10-CM | POA: Diagnosis not present

## 2018-06-09 DIAGNOSIS — E7800 Pure hypercholesterolemia, unspecified: Secondary | ICD-10-CM | POA: Diagnosis not present

## 2018-06-12 MED ORDER — PEGFILGRASTIM-CBQV 6 MG/0.6ML ~~LOC~~ SOSY
PREFILLED_SYRINGE | SUBCUTANEOUS | Status: AC
  Filled 2018-06-12: qty 0.6

## 2018-06-15 ENCOUNTER — Ambulatory Visit: Payer: PPO

## 2018-06-15 ENCOUNTER — Other Ambulatory Visit: Payer: PPO

## 2018-06-23 DIAGNOSIS — K222 Esophageal obstruction: Secondary | ICD-10-CM | POA: Diagnosis not present

## 2018-06-23 DIAGNOSIS — R1310 Dysphagia, unspecified: Secondary | ICD-10-CM | POA: Diagnosis not present

## 2018-06-28 NOTE — Progress Notes (Signed)
[name]    HEMATOLOGY/ONCOLOGY CLINIC NOTE  Date of Service: 06/29/2018   Patient Care Team: [name], MD as PCP - General (Family Medicine)  CHIEF COMPLAINTS/PURPOSE OF CONSULTATION:  F/u for continued management of Non-Small Cell lung Carcinoma  HISTORY OF PRESENTING ILLNESS:   Kayla Wyatt is a wonderful 63 y.o. female who has been referred to [place] by Dr [name] for evaluation and management of Non-Small Cell Carcinoma. She is accompanied today by her husband. The pt reports that she is doing well overall.   Patient recently had a significant hospitalization on 04/02/2017 - as per DC summary by [name] " with abdominal pain, hematemesis, melena and ABLA due to GIB.  She underwent clipping of gastric ulcer and received multiple units PRBC but continued to have drop in H/H with bloody stool. NM GI scan was negative for bleeding and she underwent colonoscopy by Dr. [name] 1/31 showing diverticulosis but no signs of bleeding. Hospital course significant for R- gastrocnemius DVT, PAF, incidental findings of mediastinal mass as well as brief episode of hematuria. She was cleared to start on Xarelto on 02/01 for treatment of DVT. On 2/2 am she developed hematemesis with maroon stools and hypotension requiring fluid bolus as well as 2 units PRBCs. She continued to decline requiring intubation, pressors as well as reversal of anticoagulation with Kcentra on 02/3. She underwent UGI revealing large clot with fresh blood in stomach and underwent mesenteric arteriogram with percutaneous coil embolization fo distal tributary of left gastric artery and placement of IVC filter by interventional radiology.   A fib with RVR felt to be due to hemorrhagic shock and she converted to NSR. [name] felt no further cardiac work up indicated and did not recommend initiating anticoagulation in short term. To follow up with [name] after PET scan and routine biopsy after medically stable. She  continued to have bleeding and underwent visceral angiogram with embolization of inferior division of splenic artery and associated gastric arteries on 2/4. No surgical intervention needed per [name] with recommendations to continue monitoring H/H as well as for recurrence of hematochezia. She tolerated extubation on 2/6 and respiratory status improving.  On 2/11, she has episode of unresponsiveness with jerking of bilateral limbs that lasted about 3 minutes. She had amnesia of events but was back to baseline. EEG revealed "focal slowing over the right temporo-occipital regionandoccasional epileptiform discharges over the right occipital region". Head CT reviewed, unremarkable for acute intracranial process. MRI brain done revealing mild biparietal signal abnormality suspicious for PRES. [name] recommended starting patient on Keppra BID with repeat MRI in 2 weeks. New onset seizure likely provoked by PRES--if resolved--Ok to take patient off Keppra. Patient with resultant generalized weakness. CIR recommended due to functional deficits. "   Later as outpatient she had a PET scan on 05/14/17 which showed a hypermetabolic mediastinal mass which was subsequently biopsied with a bronchoscopy on 05/19/17. She notes that she had smoked for about 35-40 years, half a pack of a day. She reports that she has not had any other symptoms related to her smoking; she has never had to be on home oxygen or inhalers. She has recently begun Symbicort. While being in the hospital she lost about 20 lbs but has maintained her weight since being discharged.  The pt and her husband note that she is concerned about what to do if she is not able to go back to work after her 90-day leave. We recommended that they speak with  our social workers here to explore the available options for insurance coverage /disability application etc.  Of note prior to the patient's visit today, pt has had PET/CT completed on 05/14/17 with  results revealing 1. The known mediastinal mass is intensely hypermetabolic, worrisome for malignancy. This could reflect lymphoma or small cell lung cancer. Tissue sampling recommended.2. No peripheral lung mass identified. New patchy ground-glass opacity in the lingula is mildly hypermetabolic and likely inflammatory.  On 05/19/17 the pt had a Fine Needle Aspiration which revealed Non-Small cell lung carcinoma.  Most recent lab results (05/19/17) of CBC  is as follows: all values are WNL except for RBC at 2.90, Hgb at 8.6, HCT at 28.0, RDW at 16.4.  On review of systems, pt reports knee pain, and denies abdominal pains, leg swelling, SOB, CP, and any other symptoms.   On PMHx the pt reports acute blood loss anemia, GI hemorrhage with melena, hypercholesteremia, HTN, persistent atrial fibrillation with rapid ventricular response, Vitamin D deficiency, denies liver and kidney problems. On Social Hx the pt reports having smoked for 35-40 years of half a pack per day. She quit smoking on 03/31/17.  On Family Hx the pt reports that her father had heart disease.  Interval History:   [name] returns today regarding her Non-Small Cell Carcinoma. The patient's last visit with [place] was on 06/01/18. The pt reports that she is doing well overall.  The pt reports that she has not developed any new concerns in the interim. She notes that she has not had any fevers, chills, SOB, or changes in breathing. She has been avoiding public spaces.  She had an endoscopy last week with Dr. [name] and notes that she had her food pipe stretched out. She notes that her ulcers were seen to be healed.  Lab results today (06/29/18) of CBC w/diff and CMP is as follows: all values are WNL except for RBC at 3.79, Glucose at 104, BUN at 32, Creatinine at 1.04, GFR at 56. 06/29/18 TSH is pending  On review of systems, pt reports breathing well, eating well, stable weight, stable energy levels, and denies fevers, chills,  concerns for infections, SOB, changes in breathing, abdominal pains, leg swelling, and any other symptoms.   MEDICAL HISTORY:  Past Medical History:  Diagnosis Date  . Acute blood loss anemia 04/02/2017  . Anxiety   . Gastrointestinal hemorrhage with melena   . Headache   . Hypercholesteremia   . Hypertension   . Persistent atrial fibrillation with rapid ventricular response 04/07/2017  . Seizures ([place])    04/2017 while in hospital at [facility]  . Vitamin D deficiency     SURGICAL HISTORY: Past Surgical History:  Procedure Laterality Date  . COLONOSCOPY Left 04/09/2017   Procedure: COLONOSCOPY;  Surgeon: Carol Ada, MD;  Location: [facility];  Service: Endoscopy;  Laterality: Left;  . ESOPHAGOGASTRODUODENOSCOPY N/A 04/03/2017   Procedure: ESOPHAGOGASTRODUODENOSCOPY (EGD);  Surgeon: Carol Ada, MD;  Location: [facility];  Service: Endoscopy;  Laterality: N/A;  . ESOPHAGOGASTRODUODENOSCOPY (EGD) WITH PROPOFOL N/A 04/12/2017   Procedure: ESOPHAGOGASTRODUODENOSCOPY (EGD) WITH PROPOFOL;  Surgeon: Milus Banister, MD;  Location: [facility] ENDOSCOPY;  Service: Endoscopy;  Laterality: N/A;  . IR ANGIOGRAM SELECTIVE EACH ADDITIONAL VESSEL  04/12/2017  . IR ANGIOGRAM SELECTIVE EACH ADDITIONAL VESSEL  04/13/2017  . IR ANGIOGRAM SELECTIVE EACH ADDITIONAL VESSEL  04/13/2017  . IR ANGIOGRAM SELECTIVE EACH ADDITIONAL VESSEL  04/13/2017  . IR ANGIOGRAM VISCERAL SELECTIVE  04/12/2017  . IR ANGIOGRAM VISCERAL SELECTIVE  04/13/2017  . IR EMBO ART  VEN HEMORR LYMPH EXTRAV  INC GUIDE ROADMAPPING  04/12/2017  . IR EMBO ART  VEN HEMORR LYMPH EXTRAV  INC GUIDE ROADMAPPING  04/13/2017  . IR FLUORO GUIDE CV LINE RIGHT  04/13/2017  . IR FLUORO GUIDE PORT INSERTION RIGHT  07/27/2017  . IR IVC FILTER PLMT / S&I /IMG GUID/MOD SED  04/12/2017  . IR US GUIDE VASC ACCESS RIGHT  04/12/2017  . IR US GUIDE VASC ACCESS RIGHT  04/13/2017  . IR US GUIDE VASC ACCESS RIGHT  04/13/2017  . IR US GUIDE VASC ACCESS RIGHT  07/27/2017  . LEG SURGERY  1974    Blood Clot Removal   . VIDEO BRONCHOSCOPY WITH ENDOBRONCHIAL ULTRASOUND N/A 05/19/2017   Procedure: VIDEO BRONCHOSCOPY WITH ENDOBRONCHIAL ULTRASOUND;  Surgeon: Grace Isaac, MD;  Location: [facility];  Service: Thoracic;  Laterality: N/A;    SOCIAL HISTORY: Social History   Socioeconomic History  . Marital status: Single    Spouse name: Not on file  . Number of children: Not on file  . Years of education: Not on file  . Highest education level: Not on file  Occupational History  . Occupation: Has to lift heavy boxes at times.    Employer: [employer]  Social Needs  . Financial resource strain: Not on file  . Food insecurity:    Worry: Not on file    Inability: Not on file  . Transportation needs:    Medical: Not on file    Non-medical: Not on file  Tobacco Use  . Smoking status: Former Smoker    Packs/day: 0.50    Years: 40.00    Pack years: 20.00    Types: Cigarettes    Last attempt to quit: 03/31/2017    Years since quitting: 1.2  . Smokeless tobacco: Never Used  Substance and Sexual Activity  . Alcohol use: No    Frequency: Never  . Drug use: No  . Sexual activity: Not Currently  Lifestyle  . Physical activity:    Days per week: Not on file    Minutes per session: Not on file  . Stress: Not on file  Relationships  . Social connections:    Talks on phone: Not on file    Gets together: Not on file    Attends religious service: Not on file    Active member of club or organization: Not on file    Attends meetings of clubs or organizations: Not on file    Relationship status: Not on file  . Intimate partner violence:    Fear of current or ex partner: No    Emotionally abused: No    Physically abused: No    Forced sexual activity: No  Other Topics Concern  . Not on file  Social History Narrative  . Not on file    FAMILY HISTORY: Family History  Problem Relation Age of Onset  . Heart disease Father     ALLERGIES:  has No Known Allergies.  MEDICATIONS:   Current Outpatient Medications  Medication Sig Dispense Refill  . acetaminophen (TYLENOL) 325 MG tablet Take 1-2 tablets (325-650 mg total) by mouth every 4 (four) hours as needed for mild pain.    . budesonide-formoterol (SYMBICORT) 80-4.5 MCG/ACT inhaler Inhale 2 puffs into the lungs 2 (two) times daily. 1 Inhaler 12  . cholecalciferol (VITAMIN D) 1000 units tablet Take 1,000 Units by mouth 2 (two) times daily.    [name] levETIRAcetam (KEPPRA) 500 MG  tablet TK 1 T PO BID  6  . lidocaine-prilocaine (EMLA) cream Apply to affected area once 30 g 3  . lisinopril-hydrochlorothiazide (PRINZIDE,ZESTORETIC) 20-12.5 MG tablet Take 1 tablet by mouth daily.    [name] LORazepam (ATIVAN) 0.5 MG tablet Take 2 tablets (1 mg total) by mouth once as needed for up to 1 dose (prior to PET/CT and MRI brain for anxiety). 6 tablet 0  . ondansetron (ZOFRAN) 8 MG tablet Take 1 tablet (8 mg total) by mouth 2 (two) times daily as needed for refractory nausea / vomiting. Start on day 3 after chemo. 30 tablet 1  . pantoprazole (PROTONIX) 40 MG tablet TK 1 T PO BID  6  . polycarbophil (FIBERCON) 625 MG tablet Take 2 tablets (1,250 mg total) by mouth 2 (two) times daily. 120 tablet 0  . potassium chloride SA (K-DUR,KLOR-CON) 20 MEQ tablet Take 1 tablet (20 mEq total) by mouth 2 (two) times daily. 60 tablet 0  . prochlorperazine (COMPAZINE) 10 MG tablet Take 1 tablet (10 mg total) by mouth every 6 (six) hours as needed (Nausea or vomiting). 30 tablet 1  . saccharomyces boulardii (FLORASTOR) 250 MG capsule Take 1 capsule (250 mg total) by mouth 2 (two) times daily. 100 capsule 0  . sucralfate (CARAFATE) 1 g tablet Crush and mix in 10-15 mL of water prior to swallowing qAC and HS 120 tablet 1   No current facility-administered medications for this visit.     REVIEW OF SYSTEMS:    A 10+ POINT REVIEW OF SYSTEMS WAS OBTAINED including neurology, dermatology, psychiatry, cardiac, respiratory, lymph, extremities, GI, GU, Musculoskeletal,  constitutional, breasts, reproductive, HEENT.  All pertinent positives are noted in the HPI.  All others are negative.   PHYSICAL EXAMINATION: ECOG PERFORMANCE STATUS: 1 - Symptomatic but completely ambulatory  VS reviewed in EPIC  GENERAL:alert, in no acute distress and comfortable SKIN: no acute rashes, no significant lesions EYES: conjunctiva are pink and non-injected, sclera anicteric OROPHARYNX: MMM, no exudates, no oropharyngeal erythema or ulceration NECK: supple, no JVD LYMPH:  no palpable lymphadenopathy in the cervical, axillary or inguinal regions LUNGS: clear to auscultation b/l with normal respiratory effort HEART: regular rate & rhythm ABDOMEN:  normoactive bowel sounds , non tender, not distended. No palpable hepatosplenomegaly.  Extremity: no pedal edema PSYCH: alert & oriented x 3 with fluent speech NEURO: no focal motor/sensory deficits   LABORATORY DATA:  I have reviewed the data as listed  . CBC Latest Ref Rng & Units 06/29/2018 06/01/2018 05/18/2018  WBC 4.0 - 10.5 K/uL 6.0 6.0 5.9  Hemoglobin 12.0 - 15.0 g/dL 12.1 11.4(L) 11.1(L)  Hematocrit 36.0 - 46.0 % 36.5 34.7(L) 33.8(L)  Platelets 150 - 400 K/uL 239 244 229    . CMP Latest Ref Rng & Units 06/29/2018 06/01/2018 05/18/2018  Glucose 70 - 99 mg/dL 104(H) 99 95  BUN 8 - 23 mg/dL 32(H) 26(H) 34(H)  Creatinine 0.44 - 1.00 mg/dL 1.04(H) 0.82 0.76  Sodium 135 - 145 mmol/L 140 142 140  Potassium 3.5 - 5.1 mmol/L 3.5 3.6 3.5  Chloride 98 - 111 mmol/L 105 106 107  CO2 22 - 32 mmol/L _0 Calcium 8.9 - 10.3 mg/dL 9.2 9.3 9.4  Total Protein 6.5 - 8.1 g/dL 7.1 6.9 6.6  Total Bilirubin 0.3 - 1.2 mg/dL 0.4 0.4 0.3  Alkaline Phos 38 - 126 U/L 68 69 66  AST 15 - 41 U/L _1 ALT 0 - 44 U/L 11 13 11  Component     Latest Ref Rng & Units 12/28/2017  Magnesium     1.7 - 2.4 mg/dL 1.6 (L)   05/19/17 Fine Needle Aspiration:  05/19/17 Bronchial Washing Specimen B:   RADIOGRAPHIC STUDIES: I have  personally reviewed the radiological images as listed and agreed with the findings in the report. No results found. MRI brain 04/21/2017: IMPRESSION: 1. Motion degraded examination. 2. 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 3. Otherwise negative MRI of the head with and without contrast for age.  Electronically Signed   By: Elon Alas M.D.   On: 04/22/2017 00:34    ASSESSMENT & PLAN:   63 y.o. female with  1. Recently diagnosed Non-Small Cell Carcinoma on bronchoscopy/cytology.  Not enough tissue for further characterization or mutation testing. Presenting with mediastinal nodal mass - TxN2 Mx (Atleast Stage III disease) ? Lingular primary vs inflammation. No overt evidence of metastatic disease.  -EGFR mutation studies on blood negative. Patient is s/p 3 cycles of Carboplatin/taxol -resolution left upper lobe ground-glass opacity has resolved in the interval. 08/19/17 PET revealed  No significant change in size of hypermetabolic AP window mass. The degree of FDG uptake is slightly increased in the interval. 2. Persistent hypermetabolic focus involving the distal aspect of the tongue within SUV max of 16.46. Similar to previous exam. (no clinical co-relate). Lung involvement has decreased.   08/19/17 Brain MRI revealed No evidence of intracranial metastases. Interval resolution of bilateral parietal signal abnormality favoring PRES  Completed concurrent Carbo/taxol chemotherapy + RT  12/24/17 PET/CT revealed Marked interval response to therapy with large AP window lymph nodes seen on the previous study almost imperceptible on today's exam. Hypermetabolism within this lesion has resolved with FDG accumulation now at background blood pool levels. 2. Small focus of hypermetabolism in the anal region, similar to prior and indeterminate by PET imaging. 3. Hypermetabolic focus at the distal tongue has decreased in the Interval.  4. Marked pelvic floor laxity with cystocele. 5. Stable tiny left lung nodules. 6.  Aortic Atherosclerois. 7.  Emphysema.   04/03/17 endoscopy did not reveal esophageal thickening    04/27/18 CT Chest revealed Stable exam. No new or progressive findings. 2. Stable mild circumferential wall thickening mid esophagus. Esophagitis is a consideration. 3. Stable tiny left pulmonary nodules. 4.  Emphysema. 5.  Aortic Atherosclerois.  2. Bone Pain from neulasta - managed with Tylenol  3. Previous h/ot SZ -currently on Keppra. MRI brain - resolution of findings of PRES  4. H/o rt calf DVT - off anticoagulation due to massive GI bleeding. S/p IVC filter.  5. S/p previous GI bleeding from Gastric ulcer --needing clipping and arterial embolization. hgb normal at 10.4  today. On PPI BID  6. Low Magnesium Mg 1.6 -On oral magnesium 272m BID  7. H/o ctx /steroid related thrush --now resolved.   PLAN: -Discussed pt labwork today, 06/29/18; HGB normalized to 12.1 and other blood counts normal, chemistries are stable -The pt has no prohibitive toxicities from continuing her eleventh maintenance Durvalumab every 2 weeks at this time.  -Ulcers seen to be healed with recent April 2020 repeat endoscopy  -Continue follow up with GI Dr. HBenson Norway-Recommend avoiding acidic foods, and not lying down after eating -Pt will check BP readings at home, first thing in the morning, for consideration of restarting her Lisinopril  -Recommended that the pt stay up to date with annual flu vaccine, both 5-year pneumonia vaccines, and Shingrix  -Recommended that the pt continue  to eat well, drink at least 48-64 oz of water each day, and walk 20-30 minutes each day.  -Advised crowd avoidance, infection prevention strategies, and frequent handwashing -Will see the pt back in 4 weeks  8. Elevated TSH -- ? Related to immunotherapy PLAN -ordered rpt TSH and free T4 with next labs in 2 weeks to determine need for thyroid replacement.   Continue followup as per currently scheduled appointments for the next 2 treatments   All of the patients questions were answered with apparent satisfaction. The patient knows to call the clinic with any problems, questions or concerns.  The total time spent in the appt was 25 minutes and more than 50% was on counseling and direct patient cares.   Sullivan Lone MD MS AAHIVMS [facility] Hematology/Oncology Physician [facility]  (Office):       [phone] (Work cell):  [phone] (Fax):           [phone]  06/29/2018 12:33 PM  I, Baldwin Jamaica, am acting as a scribe for Dr. Sullivan Lone.   .I have reviewed the above documentation for accuracy and completeness, and I agree with the above. Brunetta Genera MD

## 2018-06-29 ENCOUNTER — Inpatient Hospital Stay: Payer: PPO

## 2018-06-29 ENCOUNTER — Other Ambulatory Visit: Payer: Self-pay

## 2018-06-29 ENCOUNTER — Telehealth: Payer: Self-pay | Admitting: Hematology

## 2018-06-29 ENCOUNTER — Inpatient Hospital Stay: Payer: PPO | Attending: Hematology

## 2018-06-29 ENCOUNTER — Inpatient Hospital Stay (HOSPITAL_BASED_OUTPATIENT_CLINIC_OR_DEPARTMENT_OTHER): Payer: PPO | Admitting: Hematology

## 2018-06-29 VITALS — BP 105/52 | HR 73 | Temp 97.7°F | Resp 17 | Ht 63.0 in | Wt 150.5 lb

## 2018-06-29 DIAGNOSIS — R7989 Other specified abnormal findings of blood chemistry: Secondary | ICD-10-CM | POA: Diagnosis not present

## 2018-06-29 DIAGNOSIS — Z87891 Personal history of nicotine dependence: Secondary | ICD-10-CM | POA: Diagnosis not present

## 2018-06-29 DIAGNOSIS — Z79899 Other long term (current) drug therapy: Secondary | ICD-10-CM

## 2018-06-29 DIAGNOSIS — Z5112 Encounter for antineoplastic immunotherapy: Secondary | ICD-10-CM

## 2018-06-29 DIAGNOSIS — M898X9 Other specified disorders of bone, unspecified site: Secondary | ICD-10-CM

## 2018-06-29 DIAGNOSIS — Z95828 Presence of other vascular implants and grafts: Secondary | ICD-10-CM

## 2018-06-29 DIAGNOSIS — C3490 Malignant neoplasm of unspecified part of unspecified bronchus or lung: Secondary | ICD-10-CM | POA: Insufficient documentation

## 2018-06-29 DIAGNOSIS — C383 Malignant neoplasm of mediastinum, part unspecified: Secondary | ICD-10-CM

## 2018-06-29 DIAGNOSIS — Z86718 Personal history of other venous thrombosis and embolism: Secondary | ICD-10-CM

## 2018-06-29 LAB — CBC WITH DIFFERENTIAL/PLATELET
Abs Immature Granulocytes: 0.02 10*3/uL (ref 0.00–0.07)
Basophils Absolute: 0 10*3/uL (ref 0.0–0.1)
Basophils Relative: 1 %
Eosinophils Absolute: 0.2 10*3/uL (ref 0.0–0.5)
Eosinophils Relative: 4 %
HCT: 36.5 % (ref 36.0–46.0)
Hemoglobin: 12.1 g/dL (ref 12.0–15.0)
Immature Granulocytes: 0 %
Lymphocytes Relative: 20 %
Lymphs Abs: 1.2 10*3/uL (ref 0.7–4.0)
MCH: 31.9 pg (ref 26.0–34.0)
MCHC: 33.2 g/dL (ref 30.0–36.0)
MCV: 96.3 fL (ref 80.0–100.0)
Monocytes Absolute: 0.4 10*3/uL (ref 0.1–1.0)
Monocytes Relative: 7 %
Neutro Abs: 4.1 10*3/uL (ref 1.7–7.7)
Neutrophils Relative %: 68 %
Platelets: 239 10*3/uL (ref 150–400)
RBC: 3.79 MIL/uL — ABNORMAL LOW (ref 3.87–5.11)
RDW: 14.3 % (ref 11.5–15.5)
WBC: 6 10*3/uL (ref 4.0–10.5)
nRBC: 0 % (ref 0.0–0.2)

## 2018-06-29 LAB — CMP (CANCER CENTER ONLY)
ALT: 11 U/L (ref 0–44)
AST: 17 U/L (ref 15–41)
Albumin: 3.7 g/dL (ref 3.5–5.0)
Alkaline Phosphatase: 68 U/L (ref 38–126)
Anion gap: 12 (ref 5–15)
BUN: 32 mg/dL — ABNORMAL HIGH (ref 8–23)
CO2: 23 mmol/L (ref 22–32)
Calcium: 9.2 mg/dL (ref 8.9–10.3)
Chloride: 105 mmol/L (ref 98–111)
Creatinine: 1.04 mg/dL — ABNORMAL HIGH (ref 0.44–1.00)
GFR, Est AFR Am: >60 mL/min (ref >60)
GFR, Estimated: 56 mL/min — ABNORMAL LOW (ref >60)
Glucose, Bld: 104 mg/dL — ABNORMAL HIGH (ref 70–99)
Potassium: 3.5 mmol/L (ref 3.5–5.1)
Sodium: 140 mmol/L (ref 135–145)
Total Bilirubin: 0.4 mg/dL (ref 0.3–1.2)
Total Protein: 7.1 g/dL (ref 6.5–8.1)

## 2018-06-29 LAB — TSH: TSH: 34.114 u[IU]/mL — ABNORMAL HIGH (ref 0.308–3.960)

## 2018-06-29 MED ORDER — SODIUM CHLORIDE 0.9% FLUSH
10.0000 mL | INTRAVENOUS | Status: DC | PRN
  Administered 2018-06-29: 10 mL
  Filled 2018-06-29: qty 10

## 2018-06-29 MED ORDER — SODIUM CHLORIDE 0.9 % IV SOLN
Freq: Once | INTRAVENOUS | Status: AC
  Administered 2018-06-29: 13:00:00 via INTRAVENOUS
  Filled 2018-06-29: qty 250

## 2018-06-29 MED ORDER — SODIUM CHLORIDE 0.9 % IV SOLN
10.4000 mg/kg | Freq: Once | INTRAVENOUS | Status: AC
  Administered 2018-06-29: 620 mg via INTRAVENOUS
  Filled 2018-06-29: qty 10

## 2018-06-29 MED ORDER — HEPARIN SOD (PORK) LOCK FLUSH 100 UNIT/ML IV SOLN
500.0000 [IU] | Freq: Once | INTRAVENOUS | Status: AC | PRN
  Administered 2018-06-29: 500 [IU]
  Filled 2018-06-29: qty 5

## 2018-06-29 NOTE — Telephone Encounter (Signed)
Per 4/21 los, appt already scheduled.

## 2018-06-29 NOTE — Patient Instructions (Signed)
[facility] Discharge Instructions for Patients Receiving Chemotherapy  Today you received the following chemotherapy agents Durvalumab (IMFINZI).  To help prevent nausea and vomiting after your treatment, we encourage you to take your nausea medication as prescribed.   If you develop nausea and vomiting that is not controlled by your nausea medication, call the clinic.   BELOW ARE SYMPTOMS THAT SHOULD BE REPORTED IMMEDIATELY:  *FEVER GREATER THAN 100.5 F  *CHILLS WITH OR WITHOUT FEVER  NAUSEA AND VOMITING THAT IS NOT CONTROLLED WITH YOUR NAUSEA MEDICATION  *UNUSUAL SHORTNESS OF BREATH  *UNUSUAL BRUISING OR BLEEDING  TENDERNESS IN MOUTH AND THROAT WITH OR WITHOUT PRESENCE OF ULCERS  *URINARY PROBLEMS  *BOWEL PROBLEMS  UNUSUAL RASH Items with * indicate a potential emergency and should be followed up as soon as possible.  Feel free to call the clinic should you have any questions or concerns. The clinic phone number is [phone].  Please show the [facility] at check-in to the Emergency Department and triage nurse.  Coronavirus (COVID-19) Are you at risk?  Are you at risk for the Coronavirus (COVID-19)?  To be considered HIGH RISK for Coronavirus (COVID-19), you have to meet the following criteria:  . Traveled to [place], [place], [place], [place] or [place]; or in the [place] to [place], [place], [place], or [place]; and have fever, cough, and shortness of breath within the last 2 weeks of travel OR . Been in close contact with a person diagnosed with COVID-19 within the last 2 weeks and have fever, cough, and shortness of breath . IF YOU DO NOT MEET THESE CRITERIA, YOU ARE CONSIDERED LOW RISK FOR COVID-19.  What to do if you are HIGH RISK for COVID-19?  [name] If you are having a medical emergency, call 911. . Seek medical care right away. Before you go to a doctor's office, urgent care or emergency department, call ahead and tell them  about your recent travel, contact with someone diagnosed with COVID-19, and your symptoms. You should receive instructions from your physician's office regarding next steps of care.  . When you arrive at healthcare provider, tell the healthcare staff immediately you have returned from visiting [place], [place], [place], [place] or [place]; or traveled in the [place] to [place], [place], [place], or [place]; in the last two weeks or you have been in close contact with a person diagnosed with COVID-19 in the last 2 weeks.   . Tell the health care staff about your symptoms: fever, cough and shortness of breath. . After you have been seen by a medical provider, you will be either: o Tested for (COVID-19) and discharged home on quarantine except to seek medical care if symptoms worsen, and asked to  - Stay home and avoid contact with others until you get your results (4-5 days)  - Avoid travel on public transportation if possible (such as bus, train, or airplane) or o Sent to the Emergency Department by EMS for evaluation, COVID-19 testing, and possible admission depending on your condition and test results.  What to do if you are LOW RISK for COVID-19?  Reduce your risk of any infection by using the same precautions used for avoiding the common cold or flu:  [name] Wash your hands often with soap and warm water for at least 20 seconds.  If soap and water are not readily available, use an alcohol-based hand sanitizer with at least 60% alcohol.  . If coughing or  sneezing, cover your mouth and nose by coughing or sneezing into the elbow areas of your shirt or coat, into a tissue or into your sleeve (not your hands). . Avoid shaking hands with others and consider head nods or verbal greetings only. . Avoid touching your eyes, nose, or mouth with unwashed hands.  . Avoid close contact with people who are sick. . Avoid places or events with large numbers of people in one location, like concerts or  sporting events. . Carefully consider travel plans you have or are making. . If you are planning any travel outside or inside the [place], visit the CDC's Travelers' Health webpage for the latest health notices. . If you have some symptoms but not all symptoms, continue to monitor at home and seek medical attention if your symptoms worsen. . If you are having a medical emergency, call 911.   [place] / e-Visit: [email or web address]         [facility]: [facility]: [phone]                   [facility]: [phone]

## 2018-06-29 NOTE — Patient Instructions (Signed)

## 2018-06-30 NOTE — Progress Notes (Signed)
Virtual Visit via Video Note changed to phone visit at patient request.  No smart phone.   This visit type was conducted due to national recommendations for restrictions regarding the COVID-19 Pandemic (e.g. social distancing) in an effort to limit this patient's exposure and mitigate transmission in our community.  Due to her co-morbid illnesses, this patient is at least at moderate risk for complications without adequate follow up.  This format is felt to be most appropriate for this patient at this time.  All issues noted in this document were discussed and addressed.  A limited physical exam was performed with this format.  Please refer to the patient's chart for her consent to telehealth for [facility].   Evaluation Performed:  Follow-up visit  Date:  07/08/2018   ID:  Kayla Wyatt, DOB [birth year], MRN [id number]  Patient Location: Home Provider Location: Home  PCP:  [name], MD  Cardiologist:  Dr Stanford Breed  Chief Complaint:  FU atrial fibrillation  History of Present Illness:    Follow-up paroxysmal atrial fibrillation.  Patient admitted with GI bleed January 2019.  EGD showed gastric ulcer which was clipped.  She developed atrial fibrillation with rapid ventricular response which was treated with amiodarone.  Echocardiogram Feb 2019 showed ejection fraction 55 to 60% and grade 1 diastolic dysfunction.  She also was found to have a new mass on chest CT and has subsequently been diagnosed with nonsmall cell lung cancer.  Finally she was noted to have gastrocnemius DVT.  IVC filter was placed.  Note we have not anticoagulated as her episode of atrial fibrillation occurred in the setting of acute illness and she also has lung cancer and has had occasional hemoptysis.  Since last seen,  denies dyspnea, chest pain, palpitations or syncope.  The patient does not have symptoms concerning for COVID-19 infection (fever, chills, cough, or new shortness of breath).    Past Medical  History:  Diagnosis Date  . Acute blood loss anemia 04/02/2017  . Anxiety   . Gastrointestinal hemorrhage with melena   . Headache   . Hypercholesteremia   . Hypertension   . Persistent atrial fibrillation with rapid ventricular response 04/07/2017  . Seizures ([place])    04/2017 while in hospital at [facility]  . Vitamin D deficiency    Past Surgical History:  Procedure Laterality Date  . COLONOSCOPY Left 04/09/2017   Procedure: COLONOSCOPY;  Surgeon: Carol Ada, MD;  Location: [place];  Service: Endoscopy;  Laterality: Left;  . ESOPHAGOGASTRODUODENOSCOPY N/A 04/03/2017   Procedure: ESOPHAGOGASTRODUODENOSCOPY (EGD);  Surgeon: Carol Ada, MD;  Location: [place];  Service: Endoscopy;  Laterality: N/A;  . ESOPHAGOGASTRODUODENOSCOPY (EGD) WITH PROPOFOL N/A 04/12/2017   Procedure: ESOPHAGOGASTRODUODENOSCOPY (EGD) WITH PROPOFOL;  Surgeon: Milus Banister, MD;  Location[facility] ENDOSCOPY;  Service: Endoscopy;  Laterality: N/A;  . IR ANGIOGRAM SELECTIVE EACH ADDITIONAL VESSEL  04/12/2017  . IR ANGIOGRAM SELECTIVE EACH ADDITIONAL VESSEL  04/13/2017  . IR ANGIOGRAM SELECTIVE EACH ADDITIONAL VESSEL  04/13/2017  . IR ANGIOGRAM SELECTIVE EACH ADDITIONAL VESSEL  04/13/2017  . IR ANGIOGRAM VISCERAL SELECTIVE  04/12/2017  . IR ANGIOGRAM VISCERAL SELECTIVE  04/13/2017  . IR EMBO ART  VEN HEMORR LYMPH EXTRAV  INC GUIDE ROADMAPPING  04/12/2017  . IR EMBO ART  VEN HEMORR LYMPH EXTRAV  INC GUIDE ROADMAPPING  04/13/2017  . IR FLUORO GUIDE CV LINE RIGHT  04/13/2017  . IR FLUORO GUIDE PORT INSERTION RIGHT  07/27/2017  . IR IVC FILTER PLMT / S&I Burke Keels GUID/MOD  SED  04/12/2017  . IR US GUIDE VASC ACCESS RIGHT  04/12/2017  . IR US GUIDE VASC ACCESS RIGHT  04/13/2017  . IR US GUIDE VASC ACCESS RIGHT  04/13/2017  . IR US GUIDE VASC ACCESS RIGHT  07/27/2017  . LEG SURGERY  1974   Blood Clot Removal   . VIDEO BRONCHOSCOPY WITH ENDOBRONCHIAL ULTRASOUND N/A 05/19/2017   Procedure: VIDEO BRONCHOSCOPY WITH ENDOBRONCHIAL ULTRASOUND;  Surgeon:  Grace Isaac, MD;  Location: [place];  Service: Thoracic;  Laterality: N/A;     Current Meds  Medication Sig  . acetaminophen (TYLENOL) 325 MG tablet Take 1-2 tablets (325-650 mg total) by mouth every 4 (four) hours as needed for mild pain.  . budesonide-formoterol (SYMBICORT) 80-4.5 MCG/ACT inhaler Inhale 2 puffs into the lungs 2 (two) times daily.  . cholecalciferol (VITAMIN D) 1000 units tablet Take 1,000 Units by mouth 2 (two) times daily.  [name] levETIRAcetam (KEPPRA) 500 MG tablet TK 1 T PO BID  . lidocaine-prilocaine (EMLA) cream Apply to affected area once  . lisinopril-hydrochlorothiazide (PRINZIDE,ZESTORETIC) 20-12.5 MG tablet Take 1 tablet by mouth daily.  [name] LORazepam (ATIVAN) 0.5 MG tablet Take 2 tablets (1 mg total) by mouth once as needed for up to 1 dose (prior to PET/CT and MRI brain for anxiety).  . ondansetron (ZOFRAN) 8 MG tablet Take 1 tablet (8 mg total) by mouth 2 (two) times daily as needed for refractory nausea / vomiting. Start on day 3 after chemo.  . pantoprazole (PROTONIX) 40 MG tablet TK 1 T PO BID  . polycarbophil (FIBERCON) 625 MG tablet Take 2 tablets (1,250 mg total) by mouth 2 (two) times daily.  . potassium chloride SA (K-DUR,KLOR-CON) 20 MEQ tablet Take 1 tablet (20 mEq total) by mouth 2 (two) times daily.  [name] saccharomyces boulardii (FLORASTOR) 250 MG capsule Take 250 mg by mouth 2 (two) times daily.  . sucralfate (CARAFATE) 1 g tablet Crush and mix in 10-15 mL of water prior to swallowing qAC and HS     Allergies:   Patient has no known allergies.   Social History   Tobacco Use  . Smoking status: Former Smoker    Packs/day: 0.50    Years: 40.00    Pack years: 20.00    Types: Cigarettes    Last attempt to quit: 03/31/2017    Years since quitting: 1.2  . Smokeless tobacco: Never Used  Substance Use Topics  . Alcohol use: No    Frequency: Never  . Drug use: No     Family Hx: The patient's family history includes Heart disease in her father.   ROS:   Please see the history of present illness.    No F/C or productive cough All other systems reviewed and are negative.  Recent Labs: 02/09/2018: Magnesium 1.8 06/29/2018: ALT 11; BUN 32; Creatinine 1.04; Hemoglobin 12.1; Platelets 239; Potassium 3.5; Sodium 140; TSH 34.114    Wt Readings from Last 3 Encounters:  07/08/18 149 lb (67.6 kg)  06/29/18 150 lb 8 oz (68.3 kg)  06/01/18 145 lb 1.6 oz (65.8 kg)     Objective:    Vital Signs:  Ht 5\' 3"  (1.6 m)   Wt 149 lb (67.6 kg)   BMI 26.39 kg/m    VITAL SIGNS:  reviewed  Answers questions appropriately. Normal affect. No acute distress.   Remainder of physical examination not performed (telehealth visit; coronavirus pandemic).  ASSESSMENT & PLAN:    1. Paroxysmal atrial fibrillation-this occurred in the setting of  GI bleed and EGD.  She has not had recurrences.  We have not anticoagulated given this occurred in the setting of acute illness.  She also has had problems with hemoptysis related to her lung cancer now resolved. 2. Hypertension-blood pressure is controlled.  Continue present medications and follow. 3. Hyperlipidemia-followed by primary care. 4. Prior DVT-patient has an IVC filter in place.  She is not anticoagulated as outlined above. 5. Non-small cell lung cancer-managed by oncology.  COVID-19 Education: The importance of social distancing was discussed today.  Time:   Today, I have spent 15 minutes with the patient with telehealth technology discussing the above problems.     Medication Adjustments/Labs and Tests Ordered: Current medicines are reviewed at length with the patient today.  Concerns regarding medicines are outlined above.   Tests Ordered: No orders of the defined types were placed in this encounter.   Medication Changes: No orders of the defined types were placed in this encounter.   Disposition:  Follow up in 6 month(s)  Signed, Kirk Ruths, MD  07/08/2018 2:29 PM    [place]

## 2018-07-08 ENCOUNTER — Encounter: Payer: Self-pay | Admitting: Cardiology

## 2018-07-08 ENCOUNTER — Telehealth (INDEPENDENT_AMBULATORY_CARE_PROVIDER_SITE_OTHER): Payer: PPO | Admitting: Cardiology

## 2018-07-08 VITALS — BP 134/76 | HR 111 | Ht 63.0 in | Wt 149.0 lb

## 2018-07-08 DIAGNOSIS — I480 Paroxysmal atrial fibrillation: Secondary | ICD-10-CM | POA: Diagnosis not present

## 2018-07-08 DIAGNOSIS — E7800 Pure hypercholesterolemia, unspecified: Secondary | ICD-10-CM

## 2018-07-08 DIAGNOSIS — I10 Essential (primary) hypertension: Secondary | ICD-10-CM

## 2018-07-08 NOTE — Patient Instructions (Signed)

## 2018-07-13 ENCOUNTER — Other Ambulatory Visit: Payer: Self-pay

## 2018-07-13 ENCOUNTER — Inpatient Hospital Stay: Payer: PPO | Attending: Hematology

## 2018-07-13 ENCOUNTER — Inpatient Hospital Stay: Payer: PPO

## 2018-07-13 VITALS — BP 142/61 | HR 50 | Temp 97.8°F | Resp 18

## 2018-07-13 DIAGNOSIS — C3490 Malignant neoplasm of unspecified part of unspecified bronchus or lung: Secondary | ICD-10-CM | POA: Insufficient documentation

## 2018-07-13 DIAGNOSIS — Z79899 Other long term (current) drug therapy: Secondary | ICD-10-CM | POA: Insufficient documentation

## 2018-07-13 DIAGNOSIS — Z5112 Encounter for antineoplastic immunotherapy: Secondary | ICD-10-CM | POA: Diagnosis not present

## 2018-07-13 DIAGNOSIS — R7989 Other specified abnormal findings of blood chemistry: Secondary | ICD-10-CM

## 2018-07-13 DIAGNOSIS — C383 Malignant neoplasm of mediastinum, part unspecified: Secondary | ICD-10-CM

## 2018-07-13 DIAGNOSIS — Z95828 Presence of other vascular implants and grafts: Secondary | ICD-10-CM

## 2018-07-13 LAB — CBC WITH DIFFERENTIAL/PLATELET
Abs Immature Granulocytes: 0.03 10*3/uL (ref 0.00–0.07)
Basophils Absolute: 0 10*3/uL (ref 0.0–0.1)
Basophils Relative: 1 %
Eosinophils Absolute: 0.4 10*3/uL (ref 0.0–0.5)
Eosinophils Relative: 7 %
HCT: 34.3 % — ABNORMAL LOW (ref 36.0–46.0)
Hemoglobin: 11.4 g/dL — ABNORMAL LOW (ref 12.0–15.0)
Immature Granulocytes: 1 %
Lymphocytes Relative: 24 %
Lymphs Abs: 1.4 10*3/uL (ref 0.7–4.0)
MCH: 32 pg (ref 26.0–34.0)
MCHC: 33.2 g/dL (ref 30.0–36.0)
MCV: 96.3 fL (ref 80.0–100.0)
Monocytes Absolute: 0.4 10*3/uL (ref 0.1–1.0)
Monocytes Relative: 7 %
Neutro Abs: 3.6 10*3/uL (ref 1.7–7.7)
Neutrophils Relative %: 60 %
Platelets: 242 10*3/uL (ref 150–400)
RBC: 3.56 MIL/uL — ABNORMAL LOW (ref 3.87–5.11)
RDW: 14.6 % (ref 11.5–15.5)
WBC: 5.9 10*3/uL (ref 4.0–10.5)
nRBC: 0 % (ref 0.0–0.2)

## 2018-07-13 LAB — CMP (CANCER CENTER ONLY)
ALT: 13 U/L (ref 0–44)
AST: 19 U/L (ref 15–41)
Albumin: 3.5 g/dL (ref 3.5–5.0)
Alkaline Phosphatase: 68 U/L (ref 38–126)
Anion gap: 9 (ref 5–15)
BUN: 16 mg/dL (ref 8–23)
CO2: 25 mmol/L (ref 22–32)
Calcium: 8.8 mg/dL — ABNORMAL LOW (ref 8.9–10.3)
Chloride: 110 mmol/L (ref 98–111)
Creatinine: 0.8 mg/dL (ref 0.44–1.00)
GFR, Est AFR Am: >60 mL/min (ref >60)
GFR, Estimated: >60 mL/min (ref >60)
Glucose, Bld: 84 mg/dL (ref 70–99)
Potassium: 3.5 mmol/L (ref 3.5–5.1)
Sodium: 144 mmol/L (ref 135–145)
Total Bilirubin: 0.2 mg/dL — ABNORMAL LOW (ref 0.3–1.2)
Total Protein: 6.8 g/dL (ref 6.5–8.1)

## 2018-07-13 LAB — T4, FREE: Free T4: 0.3 ng/dL — ABNORMAL LOW (ref 0.82–1.77)

## 2018-07-13 LAB — TSH: TSH: 62.91 u[IU]/mL — ABNORMAL HIGH (ref 0.308–3.960)

## 2018-07-13 MED ORDER — HEPARIN SOD (PORK) LOCK FLUSH 100 UNIT/ML IV SOLN
500.0000 [IU] | Freq: Once | INTRAVENOUS | Status: AC | PRN
  Administered 2018-07-13: 15:00:00 500 [IU]
  Filled 2018-07-13: qty 5

## 2018-07-13 MED ORDER — SODIUM CHLORIDE 0.9 % IV SOLN
Freq: Once | INTRAVENOUS | Status: AC
  Administered 2018-07-13: 13:00:00 via INTRAVENOUS
  Filled 2018-07-13: qty 250

## 2018-07-13 MED ORDER — SODIUM CHLORIDE 0.9 % IV SOLN
10.4000 mg/kg | Freq: Once | INTRAVENOUS | Status: AC
  Administered 2018-07-13: 620 mg via INTRAVENOUS
  Filled 2018-07-13: qty 2.4

## 2018-07-13 MED ORDER — SODIUM CHLORIDE 0.9% FLUSH
10.0000 mL | INTRAVENOUS | Status: DC | PRN
  Administered 2018-07-13: 10 mL
  Filled 2018-07-13: qty 10

## 2018-07-13 NOTE — Patient Instructions (Signed)
Coronavirus (COVID-19) Are you at risk?  Are you at risk for the Coronavirus (COVID-19)?  To be considered HIGH RISK for Coronavirus (COVID-19), you have to meet the following criteria:  . Traveled to [place], [place], [place], [place] or [place]; or in the [place] to [place], [place], [place], or [place]; and have fever, cough, and shortness of breath within the last 2 weeks of travel OR . Been in close contact with a person diagnosed with COVID-19 within the last 2 weeks and have fever, cough, and shortness of breath . IF YOU DO NOT MEET THESE CRITERIA, YOU ARE CONSIDERED LOW RISK FOR COVID-19.  What to do if you are HIGH RISK for COVID-19?  . If you are having a medical emergency, call 911. . Seek medical care right away. Before you go to a doctor's office, urgent care or emergency department, call ahead and tell them about your recent travel, contact with someone diagnosed with COVID-19, and your symptoms. You should receive instructions from your physician's office regarding next steps of care.  . When you arrive at healthcare provider, tell the healthcare staff immediately you have returned from visiting [place], [place], [place], [place] or [place]; or traveled in the [place] to [place], [place], [place], or [place]; in the last two weeks or you have been in close contact with a person diagnosed with COVID-19 in the last 2 weeks.   . Tell the health care staff about your symptoms: fever, cough and shortness of breath. . After you have been seen by a medical provider, you will be either: o Tested for (COVID-19) and discharged home on quarantine except to seek medical care if symptoms worsen, and asked to  - Stay home and avoid contact with others until you get your results (4-5 days)  - Avoid travel on public transportation if possible (such as bus, train, or airplane) or o Sent to the Emergency Department by EMS for evaluation, COVID-19 testing, and possible  admission depending on your condition and test results.  What to do if you are LOW RISK for COVID-19?  Reduce your risk of any infection by using the same precautions used for avoiding the common cold or flu:  . Wash your hands often with soap and warm water for at least 20 seconds.  If soap and water are not readily available, use an alcohol-based hand sanitizer with at least 60% alcohol.  . If coughing or sneezing, cover your mouth and nose by coughing or sneezing into the elbow areas of your shirt or coat, into a tissue or into your sleeve (not your hands). . Avoid shaking hands with others and consider head nods or verbal greetings only. . Avoid touching your eyes, nose, or mouth with unwashed hands.  . Avoid close contact with people who are sick. . Avoid places or events with large numbers of people in one location, like concerts or sporting events. . Carefully consider travel plans you have or are making. . If you are planning any travel outside or inside the [place], visit the CDC's Travelers' Health webpage for the latest health notices. . If you have some symptoms but not all symptoms, continue to monitor at home and seek medical attention if your symptoms worsen. . If you are having a medical emergency, call 911.   ADDITIONAL HEALTHCARE OPTIONS FOR PATIENTS  [facility] / e-Visit: [email or web address]         [facility]: [phone]  [place]   Urgent Care: [facility]: [place] Discharge Instructions for Patients Receiving Chemotherapy  Today you received the following chemotherapy agents Imfinzi  To help prevent nausea and vomiting after your treatment, we encourage you to take your nausea medication as directed.    If you develop nausea and vomiting that is not controlled by your nausea medication, call the clinic.   BELOW ARE  SYMPTOMS THAT SHOULD BE REPORTED IMMEDIATELY:  *FEVER GREATER THAN 100.5 F  *CHILLS WITH OR WITHOUT FEVER  NAUSEA AND VOMITING THAT IS NOT CONTROLLED WITH YOUR NAUSEA MEDICATION  *UNUSUAL SHORTNESS OF BREATH  *UNUSUAL BRUISING OR BLEEDING  TENDERNESS IN MOUTH AND THROAT WITH OR WITHOUT PRESENCE OF ULCERS  *URINARY PROBLEMS  *BOWEL PROBLEMS  UNUSUAL RASH Items with * indicate a potential emergency and should be followed up as soon as possible.  Feel free to call the clinic should you have any questions or concerns. The clinic phone number is [phone].  Please show the [place] at check-in to the Emergency Department and triage nurse.

## 2018-07-21 DIAGNOSIS — K222 Esophageal obstruction: Secondary | ICD-10-CM | POA: Diagnosis not present

## 2018-07-26 NOTE — Progress Notes (Signed)
[name]    HEMATOLOGY/ONCOLOGY CLINIC NOTE  Date of Service: 07/27/2018   Patient Care Team: [name], MD as PCP - General (Family Medicine)  CHIEF COMPLAINTS/PURPOSE OF CONSULTATION:  F/u for continued management of Non-Small Cell lung Carcinoma  HISTORY OF PRESENTING ILLNESS:   Kayla Wyatt is a wonderful 63 y.o. female who has been referred to [place] by Dr [name] for evaluation and management of Non-Small Cell Carcinoma. She is accompanied today by her husband. The pt reports that she is doing well overall.   Patient recently had a significant hospitalization on 04/02/2017 - as per DC summary by [name] " with abdominal pain, hematemesis, melena and ABLA due to GIB.  She underwent clipping of gastric ulcer and received multiple units PRBC but continued to have drop in H/H with bloody stool. NM GI scan was negative for bleeding and she underwent colonoscopy by Dr. [name] 1/31 showing diverticulosis but no signs of bleeding. Hospital course significant for R- gastrocnemius DVT, PAF, incidental findings of mediastinal mass as well as brief episode of hematuria. She was cleared to start on Xarelto on 02/01 for treatment of DVT. On 2/2 am she developed hematemesis with maroon stools and hypotension requiring fluid bolus as well as 2 units PRBCs. She continued to decline requiring intubation, pressors as well as reversal of anticoagulation with Kcentra on 02/3. She underwent UGI revealing large clot with fresh blood in stomach and underwent mesenteric arteriogram with percutaneous coil embolization fo distal tributary of left gastric artery and placement of IVC filter by interventional radiology.   A fib with RVR felt to be due to hemorrhagic shock and she converted to NSR. [name] felt no further cardiac work up indicated and did not recommend initiating anticoagulation in short term. To follow up with [name] after PET scan and routine biopsy after medically stable. She  continued to have bleeding and underwent visceral angiogram with embolization of inferior division of splenic artery and associated gastric arteries on 2/4. No surgical intervention needed per [name] with recommendations to continue monitoring H/H as well as for recurrence of hematochezia. She tolerated extubation on 2/6 and respiratory status improving.  On 2/11, she has episode of unresponsiveness with jerking of bilateral limbs that lasted about 3 minutes. She had amnesia of events but was back to baseline. EEG revealed "focal slowing over the right temporo-occipital regionandoccasional epileptiform discharges over the right occipital region". Head CT reviewed, unremarkable for acute intracranial process. MRI brain done revealing mild biparietal signal abnormality suspicious for PRES. [name] recommended starting patient on Keppra BID with repeat MRI in 2 weeks. New onset seizure likely provoked by PRES--if resolved--Ok to take patient off Keppra. Patient with resultant generalized weakness. CIR recommended due to functional deficits. "   Later as outpatient she had a PET scan on 05/14/17 which showed a hypermetabolic mediastinal mass which was subsequently biopsied with a bronchoscopy on 05/19/17. She notes that she had smoked for about 35-40 years, half a pack of a day. She reports that she has not had any other symptoms related to her smoking; she has never had to be on home oxygen or inhalers. She has recently begun Symbicort. While being in the hospital she lost about 20 lbs but has maintained her weight since being discharged.  The pt and her husband note that she is concerned about what to do if she is not able to go back to work after her 90-day leave. We recommended that they speak with  our social workers here to explore the available options for insurance coverage /disability application etc.  Of note prior to the patient's visit today, pt has had PET/CT completed on 05/14/17 with  results revealing 1. The known mediastinal mass is intensely hypermetabolic, worrisome for malignancy. This could reflect lymphoma or small cell lung cancer. Tissue sampling recommended.2. No peripheral lung mass identified. New patchy ground-glass opacity in the lingula is mildly hypermetabolic and likely inflammatory.  On 05/19/17 the pt had a Fine Needle Aspiration which revealed Non-Small cell lung carcinoma.  Most recent lab results (05/19/17) of CBC  is as follows: all values are WNL except for RBC at 2.90, Hgb at 8.6, HCT at 28.0, RDW at 16.4.  On review of systems, pt reports knee pain, and denies abdominal pains, leg swelling, SOB, CP, and any other symptoms.   On PMHx the pt reports acute blood loss anemia, GI hemorrhage with melena, hypercholesteremia, HTN, persistent atrial fibrillation with rapid ventricular response, Vitamin D deficiency, denies liver and kidney problems. On Social Hx the pt reports having smoked for 35-40 years of half a pack per day. She quit smoking on 03/31/17.  On Family Hx the pt reports that her father had heart disease.  Interval History:   Kayla Wyatt returns today regarding her Non-Small Cell Carcinoma. The patient's last visit with [place] was on 06/29/18. The pt reports that she is doing well overall.  The pt reports that she has not developed any new concerns in the interim. She notes that she has been eating "too well," and has gained 19 pounds in the last 2 months. She denies constipation and denies fatigue or changes in breathing. She endorses dry skins.   She has continued in absolute smoking cessation and notes that she is walking a little bit each day.  Lab results today (07/27/18) of CBC w/diff and CMP is as follows: all values are WNL except for RBC at 3.67, HGB at 11.7, HCT at 35.5. 07/13/18 TSH at 62.91 07/13/18 Free T4 at 0.30  On review of systems, pt reports eating well, weight gain, dry skin, and denies changes in breathing, constipation, and  any other symptoms.   MEDICAL HISTORY:  Past Medical History:  Diagnosis Date  . Acute blood loss anemia 04/02/2017  . Anxiety   . Gastrointestinal hemorrhage with melena   . Headache   . Hypercholesteremia   . Hypertension   . Persistent atrial fibrillation with rapid ventricular response 04/07/2017  . Seizures ([place])    04/2017 while in hospital at [facility]  . Vitamin D deficiency     SURGICAL HISTORY: Past Surgical History:  Procedure Laterality Date  . COLONOSCOPY Left 04/09/2017   Procedure: COLONOSCOPY;  Surgeon: Carol Ada, MD;  Location: [facility];  Service: Endoscopy;  Laterality: Left;  . ESOPHAGOGASTRODUODENOSCOPY N/A 04/03/2017   Procedure: ESOPHAGOGASTRODUODENOSCOPY (EGD);  Surgeon: Carol Ada, MD;  Location: [facility];  Service: Endoscopy;  Laterality: N/A;  . ESOPHAGOGASTRODUODENOSCOPY (EGD) WITH PROPOFOL N/A 04/12/2017   Procedure: ESOPHAGOGASTRODUODENOSCOPY (EGD) WITH PROPOFOL;  Surgeon: Milus Banister, MD;  Location[facility] ENDOSCOPY;  Service: Endoscopy;  Laterality: N/A;  . IR ANGIOGRAM SELECTIVE EACH ADDITIONAL VESSEL  04/12/2017  . IR ANGIOGRAM SELECTIVE EACH ADDITIONAL VESSEL  04/13/2017  . IR ANGIOGRAM SELECTIVE EACH ADDITIONAL VESSEL  04/13/2017  . IR ANGIOGRAM SELECTIVE EACH ADDITIONAL VESSEL  04/13/2017  . IR ANGIOGRAM VISCERAL SELECTIVE  04/12/2017  . IR ANGIOGRAM VISCERAL SELECTIVE  04/13/2017  . IR EMBO ART  VEN HEMORR LYMPH EXTRAV  INC GUIDE ROADMAPPING  04/12/2017  . IR EMBO ART  VEN HEMORR LYMPH EXTRAV  INC GUIDE ROADMAPPING  04/13/2017  . IR FLUORO GUIDE CV LINE RIGHT  04/13/2017  . IR FLUORO GUIDE PORT INSERTION RIGHT  07/27/2017  . IR IVC FILTER PLMT / S&I /IMG GUID/MOD SED  04/12/2017  . IR US GUIDE VASC ACCESS RIGHT  04/12/2017  . IR US GUIDE VASC ACCESS RIGHT  04/13/2017  . IR US GUIDE VASC ACCESS RIGHT  04/13/2017  . IR US GUIDE VASC ACCESS RIGHT  07/27/2017  . LEG SURGERY  1974   Blood Clot Removal   . VIDEO BRONCHOSCOPY WITH ENDOBRONCHIAL ULTRASOUND N/A  05/19/2017   Procedure: VIDEO BRONCHOSCOPY WITH ENDOBRONCHIAL ULTRASOUND;  Surgeon: Grace Isaac, MD;  Location: [facility];  Service: Thoracic;  Laterality: N/A;    SOCIAL HISTORY: Social History   Socioeconomic History  . Marital status: Single    Spouse name: Not on file  . Number of children: Not on file  . Years of education: Not on file  . Highest education level: Not on file  Occupational History  . Occupation: Has to lift heavy boxes at times.    Employer: [employer]  Social Needs  . Financial resource strain: Not on file  . Food insecurity:    Worry: Not on file    Inability: Not on file  . Transportation needs:    Medical: Not on file    Non-medical: Not on file  Tobacco Use  . Smoking status: Former Smoker    Packs/day: 0.50    Years: 40.00    Pack years: 20.00    Types: Cigarettes    Last attempt to quit: 03/31/2017    Years since quitting: 1.3  . Smokeless tobacco: Never Used  Substance and Sexual Activity  . Alcohol use: No    Frequency: Never  . Drug use: No  . Sexual activity: Not Currently  Lifestyle  . Physical activity:    Days per week: Not on file    Minutes per session: Not on file  . Stress: Not on file  Relationships  . Social connections:    Talks on phone: Not on file    Gets together: Not on file    Attends religious service: Not on file    Active member of club or organization: Not on file    Attends meetings of clubs or organizations: Not on file    Relationship status: Not on file  . Intimate partner violence:    Fear of current or ex partner: No    Emotionally abused: No    Physically abused: No    Forced sexual activity: No  Other Topics Concern  . Not on file  Social History Narrative  . Not on file    FAMILY HISTORY: Family History  Problem Relation Age of Onset  . Heart disease Father     ALLERGIES:  has No Known Allergies.  MEDICATIONS:  Current Outpatient Medications  Medication Sig Dispense Refill  .  acetaminophen (TYLENOL) 325 MG tablet Take 1-2 tablets (325-650 mg total) by mouth every 4 (four) hours as needed for mild pain.    . budesonide-formoterol (SYMBICORT) 80-4.5 MCG/ACT inhaler Inhale 2 puffs into the lungs 2 (two) times daily. 1 Inhaler 12  . cholecalciferol (VITAMIN D) 1000 units tablet Take 1,000 Units by mouth 2 (two) times daily.    [name] levETIRAcetam (KEPPRA) 500 MG tablet TK 1 T PO BID  6  . levothyroxine (SYNTHROID)  50 MCG tablet Take 1 tablet (50 mcg total) by mouth daily before breakfast. 30 tablet 2  . lidocaine-prilocaine (EMLA) cream Apply to affected area once 30 g 3  . lisinopril-hydrochlorothiazide (PRINZIDE,ZESTORETIC) 20-12.5 MG tablet Take 1 tablet by mouth daily.    [name] LORazepam (ATIVAN) 0.5 MG tablet Take 2 tablets (1 mg total) by mouth once as needed for up to 1 dose (prior to PET/CT and MRI brain for anxiety). 6 tablet 0  . ondansetron (ZOFRAN) 8 MG tablet Take 1 tablet (8 mg total) by mouth 2 (two) times daily as needed for refractory nausea / vomiting. Start on day 3 after chemo. 30 tablet 1  . pantoprazole (PROTONIX) 40 MG tablet TK 1 T PO BID  6  . polycarbophil (FIBERCON) 625 MG tablet Take 2 tablets (1,250 mg total) by mouth 2 (two) times daily. 120 tablet 0  . potassium chloride SA (K-DUR,KLOR-CON) 20 MEQ tablet Take 1 tablet (20 mEq total) by mouth 2 (two) times daily. 60 tablet 0  . saccharomyces boulardii (FLORASTOR) 250 MG capsule Take 250 mg by mouth 2 (two) times daily.    . sucralfate (CARAFATE) 1 g tablet Crush and mix in 10-15 mL of water prior to swallowing qAC and HS 120 tablet 1   No current facility-administered medications for this visit.     REVIEW OF SYSTEMS:    A 10+ POINT REVIEW OF SYSTEMS WAS OBTAINED including neurology, dermatology, psychiatry, cardiac, respiratory, lymph, extremities, GI, GU, Musculoskeletal, constitutional, breasts, reproductive, HEENT.  All pertinent positives are noted in the HPI.  All others are negative.   PHYSICAL EXAMINATION: ECOG PERFORMANCE STATUS: 1 - Symptomatic but completely ambulatory  VS reviewed in EPIC  GENERAL:alert, in no acute distress and comfortable SKIN: no acute rashes, no significant lesions EYES: conjunctiva are pink and non-injected, sclera anicteric OROPHARYNX: MMM, no exudates, no oropharyngeal erythema or ulceration NECK: supple, no JVD LYMPH:  no palpable lymphadenopathy in the cervical, axillary or inguinal regions LUNGS: clear to auscultation b/l with normal respiratory effort HEART: regular rate & rhythm ABDOMEN:  normoactive bowel sounds , non tender, not distended. No palpable hepatosplenomegaly.  Extremity: no pedal edema PSYCH: alert & oriented x 3 with fluent speech NEURO: no focal motor/sensory deficits   LABORATORY DATA:  I have reviewed the data as listed  . CBC Latest Ref Rng & Units 07/27/2018 07/13/2018 06/29/2018  WBC 4.0 - 10.5 K/uL 5.9 5.9 6.0  Hemoglobin 12.0 - 15.0 g/dL 11.7(L) 11.4(L) 12.1  Hematocrit 36.0 - 46.0 % 35.5(L) 34.3(L) 36.5  Platelets 150 - 400 K/uL 258 242 239    . CMP Latest Ref Rng & Units 07/27/2018 07/13/2018 06/29/2018  Glucose 70 - 99 mg/dL 72 84 104(H)  BUN 8 - 23 mg/dL 23 16 32(H)  Creatinine 0.44 - 1.00 mg/dL 0.92 0.80 1.04(H)  Sodium 135 - 145 mmol/L 143 144 140  Potassium 3.5 - 5.1 mmol/L 3.6 3.5 3.5  Chloride 98 - 111 mmol/L 106 110 105  CO2 22 - 32 mmol/L '26 25 23  '$ Calcium 8.9 - 10.3 mg/dL 9.5 8.8(L) 9.2  Total Protein 6.5 - 8.1 g/dL 7.2 6.8 7.1  Total Bilirubin 0.3 - 1.2 mg/dL 0.3 0.2(L) 0.4  Alkaline Phos 38 - 126 U/L 68 68 68  AST 15 - 41 U/L 35 19 17  ALT 0 - 44 U/L '26 13 11   '$ Component     Latest Ref Rng & Units 12/28/2017  Magnesium     1.7 - 2.4 mg/dL  1.6 (L)   05/19/17 Fine Needle Aspiration:  05/19/17 Bronchial Washing Specimen B:   RADIOGRAPHIC STUDIES: I have personally reviewed the radiological images as listed and agreed with the findings in the report. No results found. MRI brain  04/21/2017: IMPRESSION: 1. Motion degraded examination. 2. 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 3. Otherwise negative MRI of the head with and without contrast for age.  Electronically Signed   By: Elon Alas M.D.   On: 04/22/2017 00:34    ASSESSMENT & PLAN:   63 y.o. female with  1. Recently diagnosed Non-Small Cell Carcinoma on bronchoscopy/cytology.  Not enough tissue for further characterization or mutation testing. Presenting with mediastinal nodal mass - TxN2 Mx (Atleast Stage III disease) ? Lingular primary vs inflammation. No overt evidence of metastatic disease.  -EGFR mutation studies on blood negative. Patient is s/p 3 cycles of Carboplatin/taxol -resolution left upper lobe ground-glass opacity has resolved in the interval. 08/19/17 PET revealed  No significant change in size of hypermetabolic AP window mass. The degree of FDG uptake is slightly increased in the interval. 2. Persistent hypermetabolic focus involving the distal aspect of the tongue within SUV max of 16.46. Similar to previous exam. (no clinical co-relate). Lung involvement has decreased.   08/19/17 Brain MRI revealed No evidence of intracranial metastases. Interval resolution of bilateral parietal signal abnormality favoring PRES  Completed concurrent Carbo/taxol chemotherapy + RT  12/24/17 PET/CT revealed Marked interval response to therapy with large AP window lymph nodes seen on the previous study almost imperceptible on today's exam. Hypermetabolism within this lesion has resolved with FDG accumulation now at background blood pool levels. 2. Small focus of hypermetabolism in the anal region, similar to prior and indeterminate by PET imaging. 3. Hypermetabolic focus at the distal tongue has decreased in the Interval. 4. Marked pelvic floor laxity with cystocele. 5. Stable tiny left lung nodules. 6.  Aortic Atherosclerois. 7.  Emphysema.    04/03/17 endoscopy did not reveal esophageal thickening    04/27/18 CT Chest revealed Stable exam. No new or progressive findings. 2. Stable mild circumferential wall thickening mid esophagus. Esophagitis is a consideration. 3. Stable tiny left pulmonary nodules. 4.  Emphysema. 5.  Aortic Atherosclerois.  2. Bone Pain from neulasta - managed with Tylenol  3. Previous h/ot SZ -currently on Keppra. MRI brain - resolution of findings of PRES  4. H/o rt calf DVT - off anticoagulation due to massive GI bleeding. S/p IVC filter.  5. S/p previous GI bleeding from Gastric ulcer --needing clipping and arterial embolization. On PPI BID Ulcers seen to be healed with recent April 2020 repeat endoscopy  6. Low Magnesium Mg 1.6 -On oral magnesium '200mg'$  BID  7. H/o ctx /steroid related thrush --now resolved.  8. Elevated TSH/Hypothyroidism -- ? Related to immunotherapy  PLAN: -Discussed pt labwork today, 07/27/18; blood counts and chemistries are stable -Discussed the 07/13/18 Thyroid functions which revealed TSH at 62.91 and Free T4 at 0.30 -Will begin low dose 61mg Levothyroxine and provide endocrinology referral for further optimization. Recommend taking Levothyroxine 1.5 hours before to breakfast with only water and no other medications. -The pt has no prohibitive toxicities from continuing her thirteenth maintenance Durvalumab every 2 weeks at this time. -Continue follow up with GI Dr. HBenson Norway-Recommend avoiding acidic foods, and not lying down after eating -Pt will check BP readings at home, first thing in the morning, for consideration of restarting her Lisinopril  -Recommended that the pt stay up to date  with annual flu vaccine, both 5-year pneumonia vaccines, and Shingrix  -Recommended that the pt continue to eat well, drink at least 48-64 oz of water each day, and walk 20-30 minutes each day.  -Advised crowd avoidance, infection prevention strategies, and frequent handwashing -Will see the pt  back in 4 weeks   -continue Durvalumab q2weeks with labs - plz schedule next 4 doses -RTC with Dr Irene Limbo in 4 weeks   All of the patients questions were answered with apparent satisfaction. The patient knows to call the clinic with any problems, questions or concerns.  The total time spent in the appt was 25 minutes and more than 50% was on counseling and direct patient cares.   Sullivan Lone MD Kayla AAHIVMS [facility] Hematology/Oncology Physician [facility]  (Office):       [phone] (Work cell):  [phone] (Fax):           [phone]  07/27/2018 12:45 PM  I, Baldwin Jamaica, am acting as a scribe for Dr. Sullivan Lone.   .I have reviewed the above documentation for accuracy and completeness, and I agree with the above. Brunetta Genera MD

## 2018-07-27 ENCOUNTER — Inpatient Hospital Stay: Payer: PPO

## 2018-07-27 ENCOUNTER — Inpatient Hospital Stay (HOSPITAL_BASED_OUTPATIENT_CLINIC_OR_DEPARTMENT_OTHER): Payer: PPO | Admitting: Hematology

## 2018-07-27 ENCOUNTER — Other Ambulatory Visit: Payer: Self-pay

## 2018-07-27 VITALS — Wt 162.0 lb

## 2018-07-27 VITALS — BP 147/78 | HR 96 | Temp 98.7°F | Resp 20 | Ht 63.0 in | Wt 162.8 lb

## 2018-07-27 DIAGNOSIS — Z87891 Personal history of nicotine dependence: Secondary | ICD-10-CM | POA: Diagnosis not present

## 2018-07-27 DIAGNOSIS — Z79899 Other long term (current) drug therapy: Secondary | ICD-10-CM

## 2018-07-27 DIAGNOSIS — E032 Hypothyroidism due to medicaments and other exogenous substances: Secondary | ICD-10-CM

## 2018-07-27 DIAGNOSIS — C3490 Malignant neoplasm of unspecified part of unspecified bronchus or lung: Secondary | ICD-10-CM | POA: Diagnosis not present

## 2018-07-27 DIAGNOSIS — Z5112 Encounter for antineoplastic immunotherapy: Secondary | ICD-10-CM | POA: Diagnosis not present

## 2018-07-27 DIAGNOSIS — Z86718 Personal history of other venous thrombosis and embolism: Secondary | ICD-10-CM | POA: Diagnosis not present

## 2018-07-27 DIAGNOSIS — Z95828 Presence of other vascular implants and grafts: Secondary | ICD-10-CM

## 2018-07-27 DIAGNOSIS — C383 Malignant neoplasm of mediastinum, part unspecified: Secondary | ICD-10-CM

## 2018-07-27 DIAGNOSIS — Z5111 Encounter for antineoplastic chemotherapy: Secondary | ICD-10-CM

## 2018-07-27 DIAGNOSIS — M898X9 Other specified disorders of bone, unspecified site: Secondary | ICD-10-CM

## 2018-07-27 DIAGNOSIS — C3491 Malignant neoplasm of unspecified part of right bronchus or lung: Secondary | ICD-10-CM

## 2018-07-27 LAB — CMP (CANCER CENTER ONLY)
ALT: 26 U/L (ref 0–44)
AST: 35 U/L (ref 15–41)
Albumin: 3.9 g/dL (ref 3.5–5.0)
Alkaline Phosphatase: 68 U/L (ref 38–126)
Anion gap: 11 (ref 5–15)
BUN: 23 mg/dL (ref 8–23)
CO2: 26 mmol/L (ref 22–32)
Calcium: 9.5 mg/dL (ref 8.9–10.3)
Chloride: 106 mmol/L (ref 98–111)
Creatinine: 0.92 mg/dL (ref 0.44–1.00)
GFR, Est AFR Am: >60 mL/min (ref >60)
GFR, Estimated: >60 mL/min (ref >60)
Glucose, Bld: 72 mg/dL (ref 70–99)
Potassium: 3.6 mmol/L (ref 3.5–5.1)
Sodium: 143 mmol/L (ref 135–145)
Total Bilirubin: 0.3 mg/dL (ref 0.3–1.2)
Total Protein: 7.2 g/dL (ref 6.5–8.1)

## 2018-07-27 LAB — CBC WITH DIFFERENTIAL/PLATELET
Abs Immature Granulocytes: 0.03 10*3/uL (ref 0.00–0.07)
Basophils Absolute: 0 10*3/uL (ref 0.0–0.1)
Basophils Relative: 1 %
Eosinophils Absolute: 0.3 10*3/uL (ref 0.0–0.5)
Eosinophils Relative: 5 %
HCT: 35.5 % — ABNORMAL LOW (ref 36.0–46.0)
Hemoglobin: 11.7 g/dL — ABNORMAL LOW (ref 12.0–15.0)
Immature Granulocytes: 1 %
Lymphocytes Relative: 24 %
Lymphs Abs: 1.4 10*3/uL (ref 0.7–4.0)
MCH: 31.9 pg (ref 26.0–34.0)
MCHC: 33 g/dL (ref 30.0–36.0)
MCV: 96.7 fL (ref 80.0–100.0)
Monocytes Absolute: 0.4 10*3/uL (ref 0.1–1.0)
Monocytes Relative: 7 %
Neutro Abs: 3.7 10*3/uL (ref 1.7–7.7)
Neutrophils Relative %: 62 %
Platelets: 258 10*3/uL (ref 150–400)
RBC: 3.67 MIL/uL — ABNORMAL LOW (ref 3.87–5.11)
RDW: 14.8 % (ref 11.5–15.5)
WBC: 5.9 10*3/uL (ref 4.0–10.5)
nRBC: 0 % (ref 0.0–0.2)

## 2018-07-27 MED ORDER — SODIUM CHLORIDE 0.9 % IV SOLN
740.0000 mg | Freq: Once | INTRAVENOUS | Status: AC
  Administered 2018-07-27: 740 mg via INTRAVENOUS
  Filled 2018-07-27: qty 10

## 2018-07-27 MED ORDER — SODIUM CHLORIDE 0.9 % IV SOLN
Freq: Once | INTRAVENOUS | Status: AC
  Administered 2018-07-27: 14:00:00 via INTRAVENOUS
  Filled 2018-07-27: qty 250

## 2018-07-27 MED ORDER — SODIUM CHLORIDE 0.9% FLUSH
10.0000 mL | INTRAVENOUS | Status: DC | PRN
  Administered 2018-07-27: 10 mL
  Filled 2018-07-27: qty 10

## 2018-07-27 MED ORDER — LEVOTHYROXINE SODIUM 50 MCG PO TABS: 50.0000 ug | ORAL_TABLET | Freq: Every day | ORAL | 2 refills | Status: DC

## 2018-07-27 MED ORDER — HEPARIN SOD (PORK) LOCK FLUSH 100 UNIT/ML IV SOLN
500.0000 [IU] | Freq: Once | INTRAVENOUS | Status: AC | PRN
  Administered 2018-07-27: 500 [IU]
  Filled 2018-07-27: qty 5

## 2018-07-27 NOTE — Progress Notes (Signed)
Increase Imfinzi dose to 740mg  based on change in weight.  Hardie Pulley, PharmD, BCPS, BCOP

## 2018-07-27 NOTE — Patient Instructions (Signed)
[facility] Discharge Instructions for Patients Receiving Chemotherapy  Today you received the following chemotherapy agents :  Imfinzi.  To help prevent nausea and vomiting after your treatment, we encourage you to take your nausea medication as prescribed.   If you develop nausea and vomiting that is not controlled by your nausea medication, call the clinic.   BELOW ARE SYMPTOMS THAT SHOULD BE REPORTED IMMEDIATELY:  *FEVER GREATER THAN 100.5 F  *CHILLS WITH OR WITHOUT FEVER  NAUSEA AND VOMITING THAT IS NOT CONTROLLED WITH YOUR NAUSEA MEDICATION  *UNUSUAL SHORTNESS OF BREATH  *UNUSUAL BRUISING OR BLEEDING  TENDERNESS IN MOUTH AND THROAT WITH OR WITHOUT PRESENCE OF ULCERS  *URINARY PROBLEMS  *BOWEL PROBLEMS  UNUSUAL RASH Items with * indicate a potential emergency and should be followed up as soon as possible.  Feel free to call the clinic should you have any questions or concerns. The clinic phone number is [phone].  Please show the [place] at check-in to the Emergency Department and triage nurse.

## 2018-07-28 ENCOUNTER — Telehealth: Payer: Self-pay | Admitting: Hematology

## 2018-07-28 NOTE — Telephone Encounter (Signed)
Scheduled appt per 5/19 los. ° °A calendar will be mailed out. °

## 2018-07-29 MED ORDER — FAMOTIDINE IN NACL 20-0.9 MG/50ML-% IV SOLN
INTRAVENOUS | Status: AC
  Filled 2018-07-29: qty 50

## 2018-07-29 MED ORDER — DIPHENHYDRAMINE HCL 50 MG/ML IJ SOLN
INTRAMUSCULAR | Status: AC
  Filled 2018-07-29: qty 1

## 2018-08-10 ENCOUNTER — Other Ambulatory Visit: Payer: Self-pay

## 2018-08-10 ENCOUNTER — Other Ambulatory Visit: Payer: Self-pay | Admitting: Hematology

## 2018-08-10 ENCOUNTER — Inpatient Hospital Stay: Payer: PPO

## 2018-08-10 ENCOUNTER — Inpatient Hospital Stay: Payer: PPO | Attending: Hematology

## 2018-08-10 VITALS — BP 149/62 | HR 98 | Temp 97.8°F | Resp 20 | Wt 161.8 lb

## 2018-08-10 DIAGNOSIS — Z79899 Other long term (current) drug therapy: Secondary | ICD-10-CM | POA: Insufficient documentation

## 2018-08-10 DIAGNOSIS — C3490 Malignant neoplasm of unspecified part of unspecified bronchus or lung: Secondary | ICD-10-CM

## 2018-08-10 DIAGNOSIS — Z5112 Encounter for antineoplastic immunotherapy: Secondary | ICD-10-CM | POA: Diagnosis not present

## 2018-08-10 DIAGNOSIS — Z5111 Encounter for antineoplastic chemotherapy: Secondary | ICD-10-CM

## 2018-08-10 DIAGNOSIS — E032 Hypothyroidism due to medicaments and other exogenous substances: Secondary | ICD-10-CM | POA: Diagnosis not present

## 2018-08-10 DIAGNOSIS — Z95828 Presence of other vascular implants and grafts: Secondary | ICD-10-CM

## 2018-08-10 DIAGNOSIS — I10 Essential (primary) hypertension: Secondary | ICD-10-CM | POA: Insufficient documentation

## 2018-08-10 DIAGNOSIS — Z7901 Long term (current) use of anticoagulants: Secondary | ICD-10-CM | POA: Diagnosis not present

## 2018-08-10 DIAGNOSIS — Z87891 Personal history of nicotine dependence: Secondary | ICD-10-CM | POA: Diagnosis not present

## 2018-08-10 DIAGNOSIS — C383 Malignant neoplasm of mediastinum, part unspecified: Secondary | ICD-10-CM

## 2018-08-10 LAB — CBC WITH DIFFERENTIAL/PLATELET
Abs Immature Granulocytes: 0.01 10*3/uL (ref 0.00–0.07)
Basophils Absolute: 0 10*3/uL (ref 0.0–0.1)
Basophils Relative: 1 %
Eosinophils Absolute: 0.2 10*3/uL (ref 0.0–0.5)
Eosinophils Relative: 4 %
HCT: 34.4 % — ABNORMAL LOW (ref 36.0–46.0)
Hemoglobin: 11.3 g/dL — ABNORMAL LOW (ref 12.0–15.0)
Immature Granulocytes: 0 %
Lymphocytes Relative: 23 %
Lymphs Abs: 1.3 10*3/uL (ref 0.7–4.0)
MCH: 32.3 pg (ref 26.0–34.0)
MCHC: 32.8 g/dL (ref 30.0–36.0)
MCV: 98.3 fL (ref 80.0–100.0)
Monocytes Absolute: 0.4 10*3/uL (ref 0.1–1.0)
Monocytes Relative: 7 %
Neutro Abs: 3.7 10*3/uL (ref 1.7–7.7)
Neutrophils Relative %: 65 %
Platelets: 218 10*3/uL (ref 150–400)
RBC: 3.5 MIL/uL — ABNORMAL LOW (ref 3.87–5.11)
RDW: 14.7 % (ref 11.5–15.5)
WBC: 5.7 10*3/uL (ref 4.0–10.5)
nRBC: 0 % (ref 0.0–0.2)

## 2018-08-10 LAB — CMP (CANCER CENTER ONLY)
ALT: 15 U/L (ref 0–44)
AST: 22 U/L (ref 15–41)
Albumin: 3.9 g/dL (ref 3.5–5.0)
Alkaline Phosphatase: 67 U/L (ref 38–126)
Anion gap: 8 (ref 5–15)
BUN: 21 mg/dL (ref 8–23)
CO2: 26 mmol/L (ref 22–32)
Calcium: 9.2 mg/dL (ref 8.9–10.3)
Chloride: 107 mmol/L (ref 98–111)
Creatinine: 0.84 mg/dL (ref 0.44–1.00)
GFR, Est AFR Am: >60 mL/min (ref >60)
GFR, Estimated: >60 mL/min (ref >60)
Glucose, Bld: 105 mg/dL — ABNORMAL HIGH (ref 70–99)
Potassium: 3.4 mmol/L — ABNORMAL LOW (ref 3.5–5.1)
Sodium: 141 mmol/L (ref 135–145)
Total Bilirubin: 0.4 mg/dL (ref 0.3–1.2)
Total Protein: 7.2 g/dL (ref 6.5–8.1)

## 2018-08-10 LAB — T4, FREE: Free T4: 0.52 ng/dL — ABNORMAL LOW (ref 0.82–1.77)

## 2018-08-10 LAB — TSH: TSH: 78.443 u[IU]/mL — ABNORMAL HIGH (ref 0.308–3.960)

## 2018-08-10 MED ORDER — SODIUM CHLORIDE 0.9 % IV SOLN
10.0000 mg/kg | Freq: Once | INTRAVENOUS | Status: AC
  Administered 2018-08-10: 740 mg via INTRAVENOUS
  Filled 2018-08-10: qty 4.8

## 2018-08-10 MED ORDER — HEPARIN SOD (PORK) LOCK FLUSH 100 UNIT/ML IV SOLN
500.0000 [IU] | Freq: Once | INTRAVENOUS | Status: DC | PRN
  Filled 2018-08-10: qty 5

## 2018-08-10 MED ORDER — SODIUM CHLORIDE 0.9% FLUSH
10.0000 mL | INTRAVENOUS | Status: AC | PRN
  Administered 2018-08-10: 10 mL
  Filled 2018-08-10: qty 10

## 2018-08-10 MED ORDER — HEPARIN SOD (PORK) LOCK FLUSH 100 UNIT/ML IV SOLN
500.0000 [IU] | Freq: Once | INTRAVENOUS | Status: AC | PRN
  Administered 2018-08-10: 500 [IU]
  Filled 2018-08-10: qty 5

## 2018-08-10 MED ORDER — SODIUM CHLORIDE 0.9% FLUSH
10.0000 mL | INTRAVENOUS | Status: DC | PRN
  Administered 2018-08-10: 10 mL
  Filled 2018-08-10: qty 10

## 2018-08-10 MED ORDER — SODIUM CHLORIDE 0.9 % IV SOLN
Freq: Once | INTRAVENOUS | Status: AC
  Administered 2018-08-10: 14:00:00 via INTRAVENOUS
  Filled 2018-08-10: qty 250

## 2018-08-10 NOTE — Patient Instructions (Signed)
[facility] Discharge Instructions for Patients Receiving Chemotherapy  Today you received the following chemotherapy agents: Imfinzi.  To help prevent nausea and vomiting after your treatment, we encourage you to take your nausea medication as directed.   If you develop nausea and vomiting that is not controlled by your nausea medication, call the clinic.   BELOW ARE SYMPTOMS THAT SHOULD BE REPORTED IMMEDIATELY:  *FEVER GREATER THAN 100.5 F  *CHILLS WITH OR WITHOUT FEVER  NAUSEA AND VOMITING THAT IS NOT CONTROLLED WITH YOUR NAUSEA MEDICATION  *UNUSUAL SHORTNESS OF BREATH  *UNUSUAL BRUISING OR BLEEDING  TENDERNESS IN MOUTH AND THROAT WITH OR WITHOUT PRESENCE OF ULCERS  *URINARY PROBLEMS  *BOWEL PROBLEMS  UNUSUAL RASH Items with * indicate a potential emergency and should be followed up as soon as possible.  Feel free to call the clinic should you have any questions or concerns. The clinic phone number is [phone].  Please show the CHEMO ALERT CARD at check-in to the Emergency Department and triage nurse.   

## 2018-08-10 NOTE — Addendum Note (Signed)
Addended by: Jonnie Finner D on: 08/10/2018 02:38 PM   Modules accepted: Orders

## 2018-08-12 MED ORDER — DIPHENHYDRAMINE HCL 50 MG/ML IJ SOLN
INTRAMUSCULAR | Status: AC
  Filled 2018-08-12: qty 1

## 2018-08-12 MED ORDER — FAMOTIDINE IN NACL 20-0.9 MG/50ML-% IV SOLN
INTRAVENOUS | Status: AC
  Filled 2018-08-12: qty 50

## 2018-08-19 ENCOUNTER — Other Ambulatory Visit: Payer: Self-pay

## 2018-08-19 ENCOUNTER — Ambulatory Visit (INDEPENDENT_AMBULATORY_CARE_PROVIDER_SITE_OTHER): Payer: PPO | Admitting: Internal Medicine

## 2018-08-19 ENCOUNTER — Encounter: Payer: Self-pay | Admitting: Internal Medicine

## 2018-08-19 VITALS — BP 132/78 | HR 106 | Temp 98.1°F | Ht 66.0 in | Wt 164.8 lb

## 2018-08-19 DIAGNOSIS — E032 Hypothyroidism due to medicaments and other exogenous substances: Secondary | ICD-10-CM | POA: Diagnosis not present

## 2018-08-19 MED ORDER — FAMOTIDINE IN NACL 20-0.9 MG/50ML-% IV SOLN
INTRAVENOUS | Status: AC
  Filled 2018-08-19: qty 50

## 2018-08-19 MED ORDER — LEVOTHYROXINE SODIUM 88 MCG PO TABS: 88.0000 ug | ORAL_TABLET | Freq: Every day | ORAL | 3 refills | Status: DC

## 2018-08-19 MED ORDER — DIPHENHYDRAMINE HCL 50 MG/ML IJ SOLN
INTRAMUSCULAR | Status: AC
  Filled 2018-08-19: qty 1

## 2018-08-19 NOTE — Progress Notes (Signed)
Name: Kayla Wyatt  MRN/ DOB: [id number], [birth year]    Age/ Sex: 63 y.o., female    PCP: [name], MD   Reason for Endocrinology Evaluation: Hypothyroidism     Date of Initial Endocrinology Evaluation: 08/19/2018     HPI: [name] is a 63 y.o. female with a past medical history of non-small cell Cacrinoma ( S/p chemo and aradiation ). The patient presented for initial endocrinology clinic visit on 08/19/2018 for consultative assistance with her hypothyroidism.   She was diagnosed with non-small cell lung cancer in 03/2017. She is s/p carboplatin/taxol and radiation therapy. She was started on Durvalumab infusions in 01/2018.    Pt was noted to have a high TSH on routine labs through [facility] at 34.114 uIU/mL. Repeat Labs confirmed hypothyroidism with a TSH 62.910 uIU/mL and low FT4 at 0.3 ng/dL.   Pt was started on Levothyroxine 50 mcg on 07/27/2018  Today she admits a 17 lbs weight gain in just a few weeks, she has noted intermittent fatigue and hair loss that she attributes to her chemotherapy.  She denies any cold intolerance, depression or constipation.   No local neck symptoms.   No FH of thyroid disease.       HISTORY:  Past Medical History:  Past Medical History:  Diagnosis Date   Acute blood loss anemia 04/02/2017   Anxiety    Gastrointestinal hemorrhage with melena    Headache    Hypercholesteremia    Hypertension    Persistent atrial fibrillation with rapid ventricular response 04/07/2017   Seizures ([place])    04/2017 while in hospital at [facility]   Vitamin D deficiency    Past Surgical History:  Past Surgical History:  Procedure Laterality Date   COLONOSCOPY Left 04/09/2017   Procedure: COLONOSCOPY;  Surgeon: Carol Ada, MD;  Location: [place];  Service: Endoscopy;  Laterality: Left;   ESOPHAGOGASTRODUODENOSCOPY N/A 04/03/2017   Procedure: ESOPHAGOGASTRODUODENOSCOPY (EGD);  Surgeon: Carol Ada, MD;  Location: [place];  Service: Endoscopy;  Laterality: N/A;   ESOPHAGOGASTRODUODENOSCOPY (EGD) WITH PROPOFOL N/A 04/12/2017   Procedure: ESOPHAGOGASTRODUODENOSCOPY (EGD) WITH PROPOFOL;  Surgeon: Milus Banister, MD;  Location: [facility] ENDOSCOPY;  Service: Endoscopy;  Laterality: N/A;   IR ANGIOGRAM SELECTIVE EACH ADDITIONAL VESSEL  04/12/2017   IR ANGIOGRAM SELECTIVE EACH ADDITIONAL VESSEL  04/13/2017   IR ANGIOGRAM SELECTIVE EACH ADDITIONAL VESSEL  04/13/2017   IR ANGIOGRAM SELECTIVE EACH ADDITIONAL VESSEL  04/13/2017   IR ANGIOGRAM VISCERAL SELECTIVE  04/12/2017   IR ANGIOGRAM VISCERAL SELECTIVE  04/13/2017   IR EMBO ART  VEN HEMORR LYMPH EXTRAV  INC GUIDE ROADMAPPING  04/12/2017   IR EMBO ART  VEN HEMORR LYMPH EXTRAV  INC GUIDE ROADMAPPING  04/13/2017   IR FLUORO GUIDE CV LINE RIGHT  04/13/2017   IR FLUORO GUIDE PORT INSERTION RIGHT  07/27/2017   IR IVC FILTER PLMT / S&I /IMG GUID/MOD SED  04/12/2017   IR US GUIDE VASC ACCESS RIGHT  04/12/2017   IR US GUIDE VASC ACCESS RIGHT  04/13/2017   IR US GUIDE VASC ACCESS RIGHT  04/13/2017   IR US GUIDE VASC ACCESS RIGHT  07/27/2017   LEG SURGERY  1974   Blood Clot Removal    VIDEO BRONCHOSCOPY WITH ENDOBRONCHIAL ULTRASOUND N/A 05/19/2017   Procedure: VIDEO BRONCHOSCOPY WITH ENDOBRONCHIAL ULTRASOUND;  Surgeon: Grace Isaac, MD;  Location: [place] [facility];  Service: Thoracic;  Laterality: N/A;      Social History:  reports that she quit smoking  about 16 months ago. Her smoking use included cigarettes. She has a 20.00 pack-year smoking history. She has never used smokeless tobacco. She reports that she does not drink alcohol or use drugs.  Family History: family history includes Heart disease in her father.   HOME MEDICATIONS: Allergies as of 08/19/2018   No Known Allergies     Medication List       Accurate as of August 19, 2018  1:05 PM. If you have any questions, ask your nurse or doctor.        acetaminophen 325 MG tablet Commonly known as: TYLENOL Take 1-2  tablets (325-650 mg total) by mouth every 4 (four) hours as needed for mild pain.   budesonide-formoterol 80-4.5 MCG/ACT inhaler Commonly known as: SYMBICORT Inhale 2 puffs into the lungs 2 (two) times daily.   cholecalciferol 1000 units tablet Commonly known as: VITAMIN D Take 1,000 Units by mouth 2 (two) times daily.   levETIRAcetam 500 MG tablet Commonly known as: KEPPRA TK 1 T PO BID   levothyroxine 88 MCG tablet Commonly known as: SYNTHROID Take 1 tablet (88 mcg total) by mouth daily. What changed:   medication strength  how much to take  when to take this Changed by: Dorita Sciara, MD   lidocaine-prilocaine cream Commonly known as: EMLA Apply to affected area once   lisinopril-hydrochlorothiazide 20-12.5 MG tablet Commonly known as: ZESTORETIC Take 1 tablet by mouth daily.   LORazepam 0.5 MG tablet Commonly known as: ATIVAN Take 2 tablets (1 mg total) by mouth once as needed for up to 1 dose (prior to PET/CT and MRI brain for anxiety).   ondansetron 8 MG tablet Commonly known as: Zofran Take 1 tablet (8 mg total) by mouth 2 (two) times daily as needed for refractory nausea / vomiting. Start on day 3 after chemo.   pantoprazole 40 MG tablet Commonly known as: PROTONIX TK 1 T PO BID   polycarbophil 625 MG tablet Commonly known as: FIBERCON Take 2 tablets (1,250 mg total) by mouth 2 (two) times daily.   potassium chloride SA 20 MEQ tablet Commonly known as: K-DUR Take 1 tablet (20 mEq total) by mouth 2 (two) times daily.   saccharomyces boulardii 250 MG capsule Commonly known as: FLORASTOR Take 250 mg by mouth 2 (two) times daily.   sucralfate 1 g tablet Commonly known as: Carafate Crush and mix in 10-15 mL of water prior to swallowing qAC and HS         REVIEW OF SYSTEMS: A comprehensive ROS was conducted with the patient and is negative except as per HPI and below:  Review of Systems  Constitutional: Positive for malaise/fatigue.  Negative for fever and weight loss.  HENT: Positive for sore throat. Negative for congestion.   Respiratory: Negative for cough and shortness of breath.   Cardiovascular: Negative for chest pain and palpitations.  Gastrointestinal: Negative for constipation and nausea.  Neurological: Negative for tremors and headaches.  Psychiatric/Behavioral: Negative for depression. The patient is not nervous/anxious.        OBJECTIVE:  VS: BP 132/78 (BP Location: Left Arm, Patient Position: Sitting, Cuff Size: Large)    Pulse (!) 106    Temp 98.1 F (36.7 C)    Ht 5\' 6"  (1.676 m)    Wt 164 lb 12.8 oz (74.8 kg)    SpO2 96%    BMI 26.60 kg/m    Wt Readings from Last 3 Encounters:  08/19/18 164 lb 12.8 oz (74.8 kg)  08/10/18  161 lb 12 oz (73.4 kg)  07/27/18 162 lb (73.5 kg)     EXAM: General: Pt appears well and is in NAD  Hydration: Well-hydrated with moist mucous membranes and good skin turgor  Eyes: External eye exam normal without stare, lid lag or exophthalmos.  EOM intact.    Ears, Nose, Throat: Hearing: Grossly intact bilaterally Throat: mild erythema but no exudate  Neck: General: Supple without adenopathy. Thyroid: Thyroid size normal.  No goiter or nodules appreciated. No thyroid bruit.  Lungs: Clear with good BS bilat with no rales, rhonchi, or wheezes  Heart: Auscultation: RRR.  Abdomen: Normoactive bowel sounds, soft, nontender, without masses or organomegaly palpable  Extremities:  BL LE: No pretibial edema normal ROM and strength.  Skin: Hair: Texture and amount normal with gender appropriate distribution Skin Inspection: No rashes Skin Palpation: Skin temperature, texture, and thickness normal to palpation  Neuro: Cranial nerves: II - XII grossly intact  Motor: Normal strength throughout  Mental Status: Judgment, insight: Intact Orientation: Oriented to time, place, and person Mood and affect: No depression, anxiety, or agitation     DATA REVIEWED: Results for Kayla Wyatt, Kayla Wyatt (MRN [id number]) as of 08/19/2018 12:48  Ref. Range 06/29/2018 11:05 07/13/2018 11:38 08/10/2018 13:02  TSH Latest Ref Range: 0.308 - 3.960 uIU/mL 34.114 (H) 62.910 (H) 78.443 (H)  T4,Free(Direct) Latest Ref Range: 0.82 - 1.77 ng/dL  0.30 (L) 0.52 (L)      ASSESSMENT/PLAN/RECOMMENDATIONS:   1. Hypothyroidism :  - Clinically and biochemically hypothyroid  - Most likely secondary to Durvalumab, no change in treatment needed, will replace LT-4 - Pt educated extensively on the correct way to take levothyroxine (first thing in the morning with water, 30 minutes before eating or taking other medications). - Pt encouraged to double dose the following day if she were to miss a dose given long half-life of levothyroxine. - She would like to use the 50 mcg levothyroxine pills first before getting a new prescription, she was advised to take 2 tablets of those on Friday, Saturday and Sunday and one tablet from Mon-Thursday - Once she is done with that prescription (should last her until 7/26) ,she was advised to switch to one daily of 88 mcg   Medications  Levothyroxine 88 mcg daily    Labs in 8 weeks  F/u in 4 months   Signed electronically by: Mack Guise, MD  [facility]  [address] [address] [place], [place] [address] Phone: [phone] FAX: [phone]   CC: [name], MD [place] [address] Phone: [phone] Fax: [phone]   Return to Endocrinology clinic as below: Future Appointments  Date Time Provider [place]  08/24/2018  1:15 PM [facility] None  10/21/2018 10:30 AM [facility] None  12/23/2018 11:10 AM Rosmary Dionisio, Melanie Crazier, MD [facility] None

## 2018-08-19 NOTE — Patient Instructions (Addendum)
-   PLease take Levothyroxine (50 mcg ) 2 tablets on Friday, Saturday and Sunday, one tablet the rest of the week until you are finished with this prescription - Once you get the new prescription of Levothyroxine 88 mcg , take one tablet daily .   - Repeat labs in 2 months

## 2018-08-23 NOTE — Progress Notes (Signed)
[name]    HEMATOLOGY/ONCOLOGY CLINIC NOTE  Date of Service: 08/24/2018   Patient Care Team: [name], MD as PCP - General (Family Medicine)  CHIEF COMPLAINTS/PURPOSE OF CONSULTATION:  F/u for continued management of Non-Small Cell lung Carcinoma  HISTORY OF PRESENTING ILLNESS:   Kayla Wyatt is a wonderful 63 y.o. female who has been referred to [place] by Dr [name] for evaluation and management of Non-Small Cell Carcinoma. She is accompanied today by her husband. The pt reports that she is doing well overall.   Patient recently had a significant hospitalization on 04/02/2017 - as per DC summary by [name] " with abdominal pain, hematemesis, melena and ABLA due to GIB.  She underwent clipping of gastric ulcer and received multiple units PRBC but continued to have drop in H/H with bloody stool. NM GI scan was negative for bleeding and she underwent colonoscopy by Dr. [name] 1/31 showing diverticulosis but no signs of bleeding. Hospital course significant for R- gastrocnemius DVT, PAF, incidental findings of mediastinal mass as well as brief episode of hematuria. She was cleared to start on Xarelto on 02/01 for treatment of DVT. On 2/2 am she developed hematemesis with maroon stools and hypotension requiring fluid bolus as well as 2 units PRBCs. She continued to decline requiring intubation, pressors as well as reversal of anticoagulation with Kcentra on 02/3. She underwent UGI revealing large clot with fresh blood in stomach and underwent mesenteric arteriogram with percutaneous coil embolization fo distal tributary of left gastric artery and placement of IVC filter by interventional radiology.   A fib with RVR felt to be due to hemorrhagic shock and she converted to NSR. [name] felt no further cardiac work up indicated and did not recommend initiating anticoagulation in short term. To follow up with [name] after PET scan and routine biopsy after medically stable. She  continued to have bleeding and underwent visceral angiogram with embolization of inferior division of splenic artery and associated gastric arteries on 2/4. No surgical intervention needed per [name] with recommendations to continue monitoring H/H as well as for recurrence of hematochezia. She tolerated extubation on 2/6 and respiratory status improving.  On 2/11, she has episode of unresponsiveness with jerking of bilateral limbs that lasted about 3 minutes. She had amnesia of events but was back to baseline. EEG revealed "focal slowing over the right temporo-occipital regionandoccasional epileptiform discharges over the right occipital region". Head CT reviewed, unremarkable for acute intracranial process. MRI brain done revealing mild biparietal signal abnormality suspicious for PRES. [name] recommended starting patient on Keppra BID with repeat MRI in 2 weeks. New onset seizure likely provoked by PRES--if resolved--Ok to take patient off Keppra. Patient with resultant generalized weakness. CIR recommended due to functional deficits. "   Later as outpatient she had a PET scan on 05/14/17 which showed a hypermetabolic mediastinal mass which was subsequently biopsied with a bronchoscopy on 05/19/17. She notes that she had smoked for about 35-40 years, half a pack of a day. She reports that she has not had any other symptoms related to her smoking; she has never had to be on home oxygen or inhalers. She has recently begun Symbicort. While being in the hospital she lost about 20 lbs but has maintained her weight since being discharged.  The pt and her husband note that she is concerned about what to do if she is not able to go back to work after her 90-day leave. We recommended that they speak with  our social workers here to explore the available options for insurance coverage /disability application etc.  Of note prior to the patient's visit today, pt has had PET/CT completed on 05/14/17 with  results revealing 1. The known mediastinal mass is intensely hypermetabolic, worrisome for malignancy. This could reflect lymphoma or small cell lung cancer. Tissue sampling recommended.2. No peripheral lung mass identified. New patchy ground-glass opacity in the lingula is mildly hypermetabolic and likely inflammatory.  On 05/19/17 the pt had a Fine Needle Aspiration which revealed Non-Small cell lung carcinoma.  Most recent lab results (05/19/17) of CBC  is as follows: all values are WNL except for RBC at 2.90, Hgb at 8.6, HCT at 28.0, RDW at 16.4.  On review of systems, pt reports knee pain, and denies abdominal pains, leg swelling, SOB, CP, and any other symptoms.   On PMHx the pt reports acute blood loss anemia, GI hemorrhage with melena, hypercholesteremia, HTN, persistent atrial fibrillation with rapid ventricular response, Vitamin D deficiency, denies liver and kidney problems. On Social Hx the pt reports having smoked for 35-40 years of half a pack per day. She quit smoking on 03/31/17.  On Family Hx the pt reports that her father had heart disease.  Interval History:   Kayla Wyatt returns today regarding her Non-Small Cell Carcinoma. The patient's last visit with [place] was on 07/27/18. The pt reports that she is doing well overall.  The pt reports that she was able to establish care with Dr. Kelton Pillar in endocrinology in the interim. She notes that she has had her thyroid replacement optimized. She denies fevers, CP, skin rashes, SOB, or changes in breathing. She notes that she had a "bug" last week but that this resolved after two days in which she felt she had a tickle in her throat. She notes that she has been staying mildly active with some yard work.  Lab results today (08/24/18) of CBC w/diff and CMP is as follows: all values are WNL except for RBC at 3.51, HGB at 11.4, HCT at 34.9, Total Bilirubin at 0.2. 08/24/18 TSH is pending  On review of systems, pt reports slightly improved  energy levels, eating well, weight gain, and denies fevers, CP, skin rashes, changes in breathing, diarrhea, SOB, and any other symptoms.   MEDICAL HISTORY:  Past Medical History:  Diagnosis Date   Acute blood loss anemia 04/02/2017   Anxiety    Gastrointestinal hemorrhage with melena    Headache    Hypercholesteremia    Hypertension    Persistent atrial fibrillation with rapid ventricular response 04/07/2017   Seizures ([place])    04/2017 while in hospital at [facility] [place]   Vitamin D deficiency     SURGICAL HISTORY: Past Surgical History:  Procedure Laterality Date   COLONOSCOPY Left 04/09/2017   Procedure: COLONOSCOPY;  Surgeon: Carol Ada, MD;  Location: [place];  Service: Endoscopy;  Laterality: Left;   ESOPHAGOGASTRODUODENOSCOPY N/A 04/03/2017   Procedure: ESOPHAGOGASTRODUODENOSCOPY (EGD);  Surgeon: Carol Ada, MD;  Location: [facility] [place];  Service: Endoscopy;  Laterality: N/A;   ESOPHAGOGASTRODUODENOSCOPY (EGD) WITH PROPOFOL N/A 04/12/2017   Procedure: ESOPHAGOGASTRODUODENOSCOPY (EGD) WITH PROPOFOL;  Surgeon: Milus Banister, MD;  Location: [facility] ENDOSCOPY;  Service: Endoscopy;  Laterality: N/A;   IR ANGIOGRAM SELECTIVE EACH ADDITIONAL VESSEL  04/12/2017   IR ANGIOGRAM SELECTIVE EACH ADDITIONAL VESSEL  04/13/2017   IR ANGIOGRAM SELECTIVE EACH ADDITIONAL VESSEL  04/13/2017   IR ANGIOGRAM SELECTIVE EACH ADDITIONAL VESSEL  04/13/2017   IR ANGIOGRAM VISCERAL SELECTIVE  04/12/2017  IR ANGIOGRAM VISCERAL SELECTIVE  04/13/2017   IR EMBO ART  VEN HEMORR LYMPH EXTRAV  INC GUIDE ROADMAPPING  04/12/2017   IR EMBO ART  VEN HEMORR LYMPH EXTRAV  INC GUIDE ROADMAPPING  04/13/2017   IR FLUORO GUIDE CV LINE RIGHT  04/13/2017   IR FLUORO GUIDE PORT INSERTION RIGHT  07/27/2017   IR IVC FILTER PLMT / S&I /IMG GUID/MOD SED  04/12/2017   IR US GUIDE VASC ACCESS RIGHT  04/12/2017   IR US GUIDE VASC ACCESS RIGHT  04/13/2017   IR US GUIDE VASC ACCESS RIGHT  04/13/2017   IR US GUIDE VASC ACCESS RIGHT   07/27/2017   LEG SURGERY  1974   Blood Clot Removal    VIDEO BRONCHOSCOPY WITH ENDOBRONCHIAL ULTRASOUND N/A 05/19/2017   Procedure: VIDEO BRONCHOSCOPY WITH ENDOBRONCHIAL ULTRASOUND;  Surgeon: Grace Isaac, MD;  Location: [facility] OR;  Service: Thoracic;  Laterality: N/A;    SOCIAL HISTORY: Social History   Socioeconomic History   Marital status: Single    Spouse name: Not on file   Number of children: Not on file   Years of education: Not on file   Highest education level: Not on file  Occupational History   Occupation: Has to lift heavy boxes at times.    Employer: [employer]  Scientist, product/process development strain: Not on file   Food insecurity    Worry: Not on file    Inability: Not on file   Transportation needs    Medical: Not on file    Non-medical: Not on file  Tobacco Use   Smoking status: Former Smoker    Packs/day: 0.50    Years: 40.00    Pack years: 20.00    Types: Cigarettes    Quit date: 03/31/2017    Years since quitting: 1.4   Smokeless tobacco: Never Used  Substance and Sexual Activity   Alcohol use: No    Frequency: Never   Drug use: No   Sexual activity: Not Currently  Lifestyle   Physical activity    Days per week: Not on file    Minutes per session: Not on file   Stress: Not on file  Relationships   Social connections    Talks on phone: Not on file    Gets together: Not on file    Attends religious service: Not on file    Active member of club or organization: Not on file    Attends meetings of clubs or organizations: Not on file    Relationship status: Not on file   Intimate partner violence    Fear of current or ex partner: No    Emotionally abused: No    Physically abused: No    Forced sexual activity: No  Other Topics Concern   Not on file  Social History Narrative   Not on file    FAMILY HISTORY: Family History  Problem Relation Age of Onset   Heart disease Father     ALLERGIES:  has No Known  Allergies.  MEDICATIONS:  Current Outpatient Medications  Medication Sig Dispense Refill   acetaminophen (TYLENOL) 325 MG tablet Take 1-2 tablets (325-650 mg total) by mouth every 4 (four) hours as needed for mild pain.     budesonide-formoterol (SYMBICORT) 80-4.5 MCG/ACT inhaler Inhale 2 puffs into the lungs 2 (two) times daily. 1 Inhaler 12   cholecalciferol (VITAMIN D) 1000 units tablet Take 1,000 Units by mouth 2 (two) times daily.     levETIRAcetam (  KEPPRA) 500 MG tablet TK 1 T PO BID  6   levothyroxine (SYNTHROID) 88 MCG tablet Take 1 tablet (88 mcg total) by mouth daily. 60 tablet 3   lidocaine-prilocaine (EMLA) cream Apply to affected area once 30 g 3   lisinopril-hydrochlorothiazide (PRINZIDE,ZESTORETIC) 20-12.5 MG tablet Take 1 tablet by mouth daily.     LORazepam (ATIVAN) 0.5 MG tablet Take 2 tablets (1 mg total) by mouth once as needed for up to 1 dose (prior to PET/CT and MRI brain for anxiety). 6 tablet 0   ondansetron (ZOFRAN) 8 MG tablet Take 1 tablet (8 mg total) by mouth 2 (two) times daily as needed for refractory nausea / vomiting. Start on day 3 after chemo. 30 tablet 1   pantoprazole (PROTONIX) 40 MG tablet TK 1 T PO BID  6   polycarbophil (FIBERCON) 625 MG tablet Take 2 tablets (1,250 mg total) by mouth 2 (two) times daily. 120 tablet 0   potassium chloride SA (K-DUR,KLOR-CON) 20 MEQ tablet Take 1 tablet (20 mEq total) by mouth 2 (two) times daily. 60 tablet 0   saccharomyces boulardii (FLORASTOR) 250 MG capsule Take 250 mg by mouth 2 (two) times daily.     sucralfate (CARAFATE) 1 g tablet Crush and mix in 10-15 mL of water prior to swallowing qAC and HS (Patient not taking: Reported on 08/19/2018) 120 tablet 1   No current facility-administered medications for this visit.    Facility-Administered Medications Ordered in Other Visits  Medication Dose Route Frequency Provider Last Rate Last Dose   sodium chloride flush (NS) 0.9 % injection 10 mL  10 mL  Intracatheter PRN Brunetta Genera, MD   10 mL at 08/10/18 1558    REVIEW OF SYSTEMS:    A 10+ POINT REVIEW OF SYSTEMS WAS OBTAINED including neurology, dermatology, psychiatry, cardiac, respiratory, lymph, extremities, GI, GU, Musculoskeletal, constitutional, breasts, reproductive, HEENT.  All pertinent positives are noted in the HPI.  All others are negative.  PHYSICAL EXAMINATION: ECOG PERFORMANCE STATUS: 1 - Symptomatic but completely ambulatory  VS reviewed in EPIC  GENERAL:alert, in no acute distress and comfortable SKIN: no acute rashes, no significant lesions EYES: conjunctiva are pink and non-injected, sclera anicteric OROPHARYNX: MMM, no exudates, no oropharyngeal erythema or ulceration NECK: supple, no JVD LYMPH:  no palpable lymphadenopathy in the cervical, axillary or inguinal regions LUNGS: clear to auscultation b/l with normal respiratory effort HEART: regular rate & rhythm ABDOMEN:  normoactive bowel sounds , non tender, not distended. No palpable hepatosplenomegaly.  Extremity: no pedal edema PSYCH: alert & oriented x 3 with fluent speech NEURO: no focal motor/sensory deficits  LABORATORY DATA:  I have reviewed the data as listed  . CBC Latest Ref Rng & Units 08/24/2018 08/10/2018 07/27/2018  WBC 4.0 - 10.5 K/uL 5.5 5.7 5.9  Hemoglobin 12.0 - 15.0 g/dL 11.4(L) 11.3(L) 11.7(L)  Hematocrit 36.0 - 46.0 % 34.9(L) 34.4(L) 35.5(L)  Platelets 150 - 400 K/uL 208 218 258    . CMP Latest Ref Rng & Units 08/24/2018 08/10/2018 07/27/2018  Glucose 70 - 99 mg/dL 93 105(H) 72  BUN 8 - 23 mg/dL '21 21 23  '$ Creatinine 0.44 - 1.00 mg/dL 0.91 0.84 0.92  Sodium 135 - 145 mmol/L 141 141 143  Potassium 3.5 - 5.1 mmol/L 3.6 3.4(L) 3.6  Chloride 98 - 111 mmol/L 107 107 106  CO2 22 - 32 mmol/L '23 26 26  '$ Calcium 8.9 - 10.3 mg/dL 8.9 9.2 9.5  Total Protein 6.5 - 8.1 g/dL 7.3  7.2 7.2  Total Bilirubin 0.3 - 1.2 mg/dL 0.2(L) 0.4 0.3  Alkaline Phos 38 - 126 U/L 73 67 68  AST 15 - 41 U/L  24 22 35  ALT 0 - 44 U/L '18 15 26    '$ 05/19/17 Fine Needle Aspiration:  05/19/17 Bronchial Washing Specimen B:   RADIOGRAPHIC STUDIES: I have personally reviewed the radiological images as listed and agreed with the findings in the report. No results found. MRI brain 04/21/2017: IMPRESSION: 1. Motion degraded examination. 2. 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 3. Otherwise negative MRI of the head with and without contrast for age.  Electronically Signed   By: Elon Alas M.D.   On: 04/22/2017 00:34    ASSESSMENT & PLAN:   63 y.o. female with  1. Recently diagnosed Non-Small Cell Carcinoma on bronchoscopy/cytology.  Not enough tissue for further characterization or mutation testing. Presenting with mediastinal nodal mass - TxN2 Mx (Atleast Stage III disease) ? Lingular primary vs inflammation. No overt evidence of metastatic disease.  -EGFR mutation studies on blood negative. Patient is s/p 3 cycles of Carboplatin/taxol -resolution left upper lobe ground-glass opacity has resolved in the interval. 08/19/17 PET revealed  No significant change in size of hypermetabolic AP window mass. The degree of FDG uptake is slightly increased in the interval. 2. Persistent hypermetabolic focus involving the distal aspect of the tongue within SUV max of 16.46. Similar to previous exam. (no clinical co-relate). Lung involvement has decreased.   08/19/17 Brain MRI revealed No evidence of intracranial metastases. Interval resolution of bilateral parietal signal abnormality favoring PRES  Completed concurrent Carbo/taxol chemotherapy + RT  12/24/17 PET/CT revealed Marked interval response to therapy with large AP window lymph nodes seen on the previous study almost imperceptible on today's exam. Hypermetabolism within this lesion has resolved with FDG accumulation now at background blood pool levels. 2. Small focus of hypermetabolism  in the anal region, similar to prior and indeterminate by PET imaging. 3. Hypermetabolic focus at the distal tongue has decreased in the Interval. 4. Marked pelvic floor laxity with cystocele. 5. Stable tiny left lung nodules. 6.  Aortic Atherosclerois. 7.  Emphysema.   04/03/17 endoscopy did not reveal esophageal thickening    04/27/18 CT Chest revealed Stable exam. No new or progressive findings. 2. Stable mild circumferential wall thickening mid esophagus. Esophagitis is a consideration. 3. Stable tiny left pulmonary nodules. 4.  Emphysema. 5.  Aortic Atherosclerois.  2. Bone Pain from neulasta - managed with Tylenol  3. Previous h/ot SZ -currently on Keppra. MRI brain - resolution of findings of PRES  4. H/o rt calf DVT - off anticoagulation due to massive GI bleeding. S/p IVC filter. No issues currently  5. S/p previous GI bleeding from Gastric ulcer --needing clipping and arterial embolization. On PPI BID Ulcers seen to be healed with recent April 2020 repeat endoscopy  6. Low Magnesium Mg 1.6 -On oral magnesium '200mg'$  BID  7. H/o ctx /steroid related thrush --now resolved.  8. Elevated TSH/Hypothyroidism -- ? Related to immunotherapy 07/13/18 Thyroid functions revealed TSH at 62.91 and Free T4 at 0.30 Began low dose 27mg Levothyroxine and provide endocrinology referral for further optimization. Recommend taking Levothyroxine 1.5 hours before to breakfast with only water and no other medications.  F/u with Dr. SKelton Pillarin Endocrinology  PLAN: -Discussed pt labwork today, 08/24/18; blood counts and chemistries are stable. TSH improved to 31.9.- being optimized by endocrinology. -The pt has no prohibitive toxicities from continuing her  fifteenth dose of maintenance Durvalumab, every two weeks, at this time. -Continue follow up with GI Dr. [name] -Recommend avoiding acidic foods, and not lying down after eating -Pt will check BP readings at home, first thing in the morning, for  consideration of restarting her Lisinopril  -Recommended that the pt stay up to date with annual flu vaccine, both 5-year pneumonia vaccines, and Shingrix  -Recommended that the pt continue to eat well, drink at least 48-64 oz of water each day, and walk 20-30 minutes each day. -Advised crowd avoidance, infection prevention strategies, and frequent handwashing -Will see the pt back in 4 weeks   -continue Durvalumab q2weeks with labs - plz schedule next 4 doses -RTC with Dr Irene Limbo in 4 weeks   All of the patients questions were answered with apparent satisfaction. The patient knows to call the clinic with any problems, questions or concerns.  The total time spent in the appt was 25 minutes and more than 50% was on counseling and direct patient cares.   Sullivan Lone MD Kayla AAHIVMS [facility] Hematology/Oncology Physician [facility]  (Office):       [phone] (Work cell):  [phone] (Fax):           [phone]  08/24/2018 2:26 PM  I, Baldwin [place], am acting as a scribe for Dr. Sullivan Lone.   .I have reviewed the above documentation for accuracy and completeness, and I agree with the above. Brunetta Genera MD

## 2018-08-24 ENCOUNTER — Inpatient Hospital Stay: Payer: PPO

## 2018-08-24 ENCOUNTER — Other Ambulatory Visit: Payer: Self-pay

## 2018-08-24 ENCOUNTER — Inpatient Hospital Stay (HOSPITAL_BASED_OUTPATIENT_CLINIC_OR_DEPARTMENT_OTHER): Payer: PPO | Admitting: Hematology

## 2018-08-24 VITALS — BP 115/48 | HR 87 | Temp 98.7°F | Resp 18 | Ht 66.0 in | Wt 162.0 lb

## 2018-08-24 DIAGNOSIS — C383 Malignant neoplasm of mediastinum, part unspecified: Secondary | ICD-10-CM

## 2018-08-24 DIAGNOSIS — Z5112 Encounter for antineoplastic immunotherapy: Secondary | ICD-10-CM

## 2018-08-24 DIAGNOSIS — I10 Essential (primary) hypertension: Secondary | ICD-10-CM | POA: Diagnosis not present

## 2018-08-24 DIAGNOSIS — Z87891 Personal history of nicotine dependence: Secondary | ICD-10-CM | POA: Diagnosis not present

## 2018-08-24 DIAGNOSIS — E032 Hypothyroidism due to medicaments and other exogenous substances: Secondary | ICD-10-CM | POA: Diagnosis not present

## 2018-08-24 DIAGNOSIS — C3490 Malignant neoplasm of unspecified part of unspecified bronchus or lung: Secondary | ICD-10-CM | POA: Diagnosis not present

## 2018-08-24 DIAGNOSIS — Z95828 Presence of other vascular implants and grafts: Secondary | ICD-10-CM

## 2018-08-24 LAB — CBC WITH DIFFERENTIAL/PLATELET
Abs Immature Granulocytes: 0.02 10*3/uL (ref 0.00–0.07)
Basophils Absolute: 0 10*3/uL (ref 0.0–0.1)
Basophils Relative: 1 %
Eosinophils Absolute: 0.3 10*3/uL (ref 0.0–0.5)
Eosinophils Relative: 5 %
HCT: 34.9 % — ABNORMAL LOW (ref 36.0–46.0)
Hemoglobin: 11.4 g/dL — ABNORMAL LOW (ref 12.0–15.0)
Immature Granulocytes: 0 %
Lymphocytes Relative: 24 %
Lymphs Abs: 1.3 10*3/uL (ref 0.7–4.0)
MCH: 32.5 pg (ref 26.0–34.0)
MCHC: 32.7 g/dL (ref 30.0–36.0)
MCV: 99.4 fL (ref 80.0–100.0)
Monocytes Absolute: 0.5 10*3/uL (ref 0.1–1.0)
Monocytes Relative: 8 %
Neutro Abs: 3.4 10*3/uL (ref 1.7–7.7)
Neutrophils Relative %: 62 %
Platelets: 208 10*3/uL (ref 150–400)
RBC: 3.51 MIL/uL — ABNORMAL LOW (ref 3.87–5.11)
RDW: 14.7 % (ref 11.5–15.5)
WBC: 5.5 10*3/uL (ref 4.0–10.5)
nRBC: 0 % (ref 0.0–0.2)

## 2018-08-24 LAB — CMP (CANCER CENTER ONLY)
ALT: 18 U/L (ref 0–44)
AST: 24 U/L (ref 15–41)
Albumin: 3.9 g/dL (ref 3.5–5.0)
Alkaline Phosphatase: 73 U/L (ref 38–126)
Anion gap: 11 (ref 5–15)
BUN: 21 mg/dL (ref 8–23)
CO2: 23 mmol/L (ref 22–32)
Calcium: 8.9 mg/dL (ref 8.9–10.3)
Chloride: 107 mmol/L (ref 98–111)
Creatinine: 0.91 mg/dL (ref 0.44–1.00)
GFR, Est AFR Am: >60 mL/min (ref >60)
GFR, Estimated: >60 mL/min (ref >60)
Glucose, Bld: 93 mg/dL (ref 70–99)
Potassium: 3.6 mmol/L (ref 3.5–5.1)
Sodium: 141 mmol/L (ref 135–145)
Total Bilirubin: 0.2 mg/dL — ABNORMAL LOW (ref 0.3–1.2)
Total Protein: 7.3 g/dL (ref 6.5–8.1)

## 2018-08-24 LAB — TSH: TSH: 31.918 u[IU]/mL — ABNORMAL HIGH (ref 0.308–3.960)

## 2018-08-24 MED ORDER — HEPARIN SOD (PORK) LOCK FLUSH 100 UNIT/ML IV SOLN
500.0000 [IU] | Freq: Once | INTRAVENOUS | Status: AC | PRN
  Administered 2018-08-24: 500 [IU]
  Filled 2018-08-24: qty 5

## 2018-08-24 MED ORDER — SODIUM CHLORIDE 0.9% FLUSH
10.0000 mL | INTRAVENOUS | Status: DC | PRN
  Administered 2018-08-24: 10 mL
  Filled 2018-08-24: qty 10

## 2018-08-24 MED ORDER — SODIUM CHLORIDE 0.9 % IV SOLN
10.0000 mg/kg | Freq: Once | INTRAVENOUS | Status: AC
  Administered 2018-08-24: 740 mg via INTRAVENOUS
  Filled 2018-08-24: qty 10

## 2018-08-24 MED ORDER — SODIUM CHLORIDE 0.9 % IV SOLN
Freq: Once | INTRAVENOUS | Status: AC
  Administered 2018-08-24: 15:00:00 via INTRAVENOUS
  Filled 2018-08-24: qty 250

## 2018-08-24 NOTE — Patient Instructions (Signed)
Coronavirus (COVID-19) Are you at risk?  Are you at risk for the Coronavirus (COVID-19)?  To be considered HIGH RISK for Coronavirus (COVID-19), you have to meet the following criteria:  . Traveled to [place], [place], [place], [place] or [place]; or in the [place] to [place], [place], [place], or [place]; and have fever, cough, and shortness of breath within the last 2 weeks of travel OR . Been in close contact with a person diagnosed with COVID-19 within the last 2 weeks and have fever, cough, and shortness of breath . IF YOU DO NOT MEET THESE CRITERIA, YOU ARE CONSIDERED LOW RISK FOR COVID-19.  What to do if you are HIGH RISK for COVID-19?  [name] If you are having a medical emergency, call 911. . Seek medical care right away. Before you go to a doctor's office, urgent care or emergency department, call ahead and tell them about your recent travel, contact with someone diagnosed with COVID-19, and your symptoms. You should receive instructions from your physician's office regarding next steps of care.  . When you arrive at healthcare provider, tell the healthcare staff immediately you have returned from visiting [place], [place], [place], [place] or [place]; or traveled in the [place] to [place], [place], [place], or [place]; in the last two weeks or you have been in close contact with a person diagnosed with COVID-19 in the last 2 weeks.   . Tell the health care staff about your symptoms: fever, cough and shortness of breath. . After you have been seen by a medical provider, you will be either: o Tested for (COVID-19) and discharged home on quarantine except to seek medical care if symptoms worsen, and asked to  - Stay home and avoid contact with others until you get your results (4-5 days)  - Avoid travel on public transportation if possible (such as bus, train, or airplane) or o Sent to the Emergency Department by EMS for evaluation, COVID-19 testing, and possible  admission depending on your condition and test results.  What to do if you are LOW RISK for COVID-19?  Reduce your risk of any infection by using the same precautions used for avoiding the common cold or flu:  [name] Wash your hands often with soap and warm water for at least 20 seconds.  If soap and water are not readily available, use an alcohol-based hand sanitizer with at least 60% alcohol.  . If coughing or sneezing, cover your mouth and nose by coughing or sneezing into the elbow areas of your shirt or coat, into a tissue or into your sleeve (not your hands). . Avoid shaking hands with others and consider head nods or verbal greetings only. . Avoid touching your eyes, nose, or mouth with unwashed hands.  . Avoid close contact with people who are sick. . Avoid places or events with large numbers of people in one location, like concerts or sporting events. . Carefully consider travel plans you have or are making. . If you are planning any travel outside or inside the [place], visit the CDC's Travelers' Health webpage for the latest health notices. . If you have some symptoms but not all symptoms, continue to monitor at home and seek medical attention if your symptoms worsen. . If you are having a medical emergency, call 911.   [place] / e-Visit: [email or web address]         [facility]: [place]  [facility]: [facility]: [place] Discharge Instructions for Patients Receiving Chemotherapy  Today you received the following chemotherapy agents Imfinzi  To help prevent nausea and vomiting after your treatment, we encourage you to take your nausea medication as directed.    If you develop nausea and vomiting that is not controlled by your nausea medication, call the clinic.   BELOW ARE  SYMPTOMS THAT SHOULD BE REPORTED IMMEDIATELY:  *FEVER GREATER THAN 100.5 F  *CHILLS WITH OR WITHOUT FEVER  NAUSEA AND VOMITING THAT IS NOT CONTROLLED WITH YOUR NAUSEA MEDICATION  *UNUSUAL SHORTNESS OF BREATH  *UNUSUAL BRUISING OR BLEEDING  TENDERNESS IN MOUTH AND THROAT WITH OR WITHOUT PRESENCE OF ULCERS  *URINARY PROBLEMS  *BOWEL PROBLEMS  UNUSUAL RASH Items with * indicate a potential emergency and should be followed up as soon as possible.  Feel free to call the clinic should you have any questions or concerns. The clinic phone number is [phone].  Please show the [place] at check-in to the Emergency Department and triage nurse.

## 2018-08-25 ENCOUNTER — Telehealth: Payer: Self-pay | Admitting: Hematology

## 2018-08-25 NOTE — Telephone Encounter (Signed)
Per 6/16 los appts already scheduled.

## 2018-08-26 MED ORDER — FAMOTIDINE IN NACL 20-0.9 MG/50ML-% IV SOLN
INTRAVENOUS | Status: AC
  Filled 2018-08-26: qty 50

## 2018-08-26 MED ORDER — DIPHENHYDRAMINE HCL 50 MG/ML IJ SOLN
INTRAMUSCULAR | Status: AC
  Filled 2018-08-26: qty 1

## 2018-09-02 MED ORDER — DIPHENHYDRAMINE HCL 50 MG/ML IJ SOLN
INTRAMUSCULAR | Status: AC
  Filled 2018-09-02: qty 1

## 2018-09-02 MED ORDER — FAMOTIDINE IN NACL 20-0.9 MG/50ML-% IV SOLN
INTRAVENOUS | Status: AC
  Filled 2018-09-02: qty 50

## 2018-09-07 ENCOUNTER — Inpatient Hospital Stay: Payer: PPO

## 2018-09-07 ENCOUNTER — Other Ambulatory Visit: Payer: Self-pay

## 2018-09-07 VITALS — BP 155/68 | HR 94 | Temp 98.5°F | Resp 18

## 2018-09-07 DIAGNOSIS — C3490 Malignant neoplasm of unspecified part of unspecified bronchus or lung: Secondary | ICD-10-CM

## 2018-09-07 DIAGNOSIS — Z95828 Presence of other vascular implants and grafts: Secondary | ICD-10-CM

## 2018-09-07 DIAGNOSIS — Z5112 Encounter for antineoplastic immunotherapy: Secondary | ICD-10-CM | POA: Diagnosis not present

## 2018-09-07 DIAGNOSIS — C383 Malignant neoplasm of mediastinum, part unspecified: Secondary | ICD-10-CM

## 2018-09-07 DIAGNOSIS — Z5111 Encounter for antineoplastic chemotherapy: Secondary | ICD-10-CM

## 2018-09-07 DIAGNOSIS — E032 Hypothyroidism due to medicaments and other exogenous substances: Secondary | ICD-10-CM

## 2018-09-07 LAB — CBC WITH DIFFERENTIAL/PLATELET
Abs Immature Granulocytes: 0.03 10*3/uL (ref 0.00–0.07)
Basophils Absolute: 0.1 10*3/uL (ref 0.0–0.1)
Basophils Relative: 1 %
Eosinophils Absolute: 0.3 10*3/uL (ref 0.0–0.5)
Eosinophils Relative: 5 %
HCT: 32 % — ABNORMAL LOW (ref 36.0–46.0)
Hemoglobin: 10.7 g/dL — ABNORMAL LOW (ref 12.0–15.0)
Immature Granulocytes: 1 %
Lymphocytes Relative: 20 %
Lymphs Abs: 1.2 10*3/uL (ref 0.7–4.0)
MCH: 32.8 pg (ref 26.0–34.0)
MCHC: 33.4 g/dL (ref 30.0–36.0)
MCV: 98.2 fL (ref 80.0–100.0)
Monocytes Absolute: 0.5 10*3/uL (ref 0.1–1.0)
Monocytes Relative: 8 %
Neutro Abs: 4.2 10*3/uL (ref 1.7–7.7)
Neutrophils Relative %: 65 %
Platelets: 272 10*3/uL (ref 150–400)
RBC: 3.26 MIL/uL — ABNORMAL LOW (ref 3.87–5.11)
RDW: 14.1 % (ref 11.5–15.5)
WBC: 6.3 10*3/uL (ref 4.0–10.5)
nRBC: 0 % (ref 0.0–0.2)

## 2018-09-07 LAB — CMP (CANCER CENTER ONLY)
ALT: 25 U/L (ref 0–44)
AST: 20 U/L (ref 15–41)
Albumin: 3.4 g/dL — ABNORMAL LOW (ref 3.5–5.0)
Alkaline Phosphatase: 83 U/L (ref 38–126)
Anion gap: 9 (ref 5–15)
BUN: 16 mg/dL (ref 8–23)
CO2: 26 mmol/L (ref 22–32)
Calcium: 8.9 mg/dL (ref 8.9–10.3)
Chloride: 106 mmol/L (ref 98–111)
Creatinine: 0.86 mg/dL (ref 0.44–1.00)
GFR, Est AFR Am: >60 mL/min (ref >60)
GFR, Estimated: >60 mL/min (ref >60)
Glucose, Bld: 115 mg/dL — ABNORMAL HIGH (ref 70–99)
Potassium: 3.3 mmol/L — ABNORMAL LOW (ref 3.5–5.1)
Sodium: 141 mmol/L (ref 135–145)
Total Bilirubin: 0.2 mg/dL — ABNORMAL LOW (ref 0.3–1.2)
Total Protein: 7.2 g/dL (ref 6.5–8.1)

## 2018-09-07 LAB — TSH: TSH: 12.638 u[IU]/mL — ABNORMAL HIGH (ref 0.308–3.960)

## 2018-09-07 LAB — T4, FREE: Free T4: 1.01 ng/dL (ref 0.61–1.12)

## 2018-09-07 MED ORDER — SODIUM CHLORIDE 0.9% FLUSH
10.0000 mL | INTRAVENOUS | Status: DC | PRN
  Administered 2018-09-07: 10 mL
  Filled 2018-09-07: qty 10

## 2018-09-07 MED ORDER — HEPARIN SOD (PORK) LOCK FLUSH 100 UNIT/ML IV SOLN
500.0000 [IU] | Freq: Once | INTRAVENOUS | Status: AC | PRN
  Administered 2018-09-07: 500 [IU]
  Filled 2018-09-07: qty 5

## 2018-09-07 MED ORDER — SODIUM CHLORIDE 0.9 % IV SOLN
10.0000 mg/kg | Freq: Once | INTRAVENOUS | Status: AC
  Administered 2018-09-07: 740 mg via INTRAVENOUS
  Filled 2018-09-07: qty 4.8

## 2018-09-07 MED ORDER — SODIUM CHLORIDE 0.9 % IV SOLN
Freq: Once | INTRAVENOUS | Status: AC
  Administered 2018-09-07: 15:00:00 via INTRAVENOUS
  Filled 2018-09-07: qty 250

## 2018-09-07 NOTE — Patient Instructions (Signed)
Coronavirus (COVID-19) Are you at risk?  Are you at risk for the Coronavirus (COVID-19)?  To be considered HIGH RISK for Coronavirus (COVID-19), you have to meet the following criteria:  . Traveled to [place], [place], [place], [place] or [place]; or in the [place] to [place], [place], [place], or [place]; and have fever, cough, and shortness of breath within the last 2 weeks of travel OR . Been in close contact with a person diagnosed with COVID-19 within the last 2 weeks and have fever, cough, and shortness of breath . IF YOU DO NOT MEET THESE CRITERIA, YOU ARE CONSIDERED LOW RISK FOR COVID-19.  What to do if you are HIGH RISK for COVID-19?  [name] If you are having a medical emergency, call 911. . Seek medical care right away. Before you go to a doctor's office, urgent care or emergency department, call ahead and tell them about your recent travel, contact with someone diagnosed with COVID-19, and your symptoms. You should receive instructions from your physician's office regarding next steps of care.  . When you arrive at healthcare provider, tell the healthcare staff immediately you have returned from visiting [place], [place], [place], [place] or [place]; or traveled in the [place] to [place], [place], [place], or [place]; in the last two weeks or you have been in close contact with a person diagnosed with COVID-19 in the last 2 weeks.   . Tell the health care staff about your symptoms: fever, cough and shortness of breath. . After you have been seen by a medical provider, you will be either: o Tested for (COVID-19) and discharged home on quarantine except to seek medical care if symptoms worsen, and asked to  - Stay home and avoid contact with others until you get your results (4-5 days)  - Avoid travel on public transportation if possible (such as bus, train, or airplane) or o Sent to the Emergency Department by EMS for evaluation, COVID-19 testing, and possible  admission depending on your condition and test results.  What to do if you are LOW RISK for COVID-19?  Reduce your risk of any infection by using the same precautions used for avoiding the common cold or flu:  [name] Wash your hands often with soap and warm water for at least 20 seconds.  If soap and water are not readily available, use an alcohol-based hand sanitizer with at least 60% alcohol.  . If coughing or sneezing, cover your mouth and nose by coughing or sneezing into the elbow areas of your shirt or coat, into a tissue or into your sleeve (not your hands). . Avoid shaking hands with others and consider head nods or verbal greetings only. . Avoid touching your eyes, nose, or mouth with unwashed hands.  . Avoid close contact with people who are sick. . Avoid places or events with large numbers of people in one location, like concerts or sporting events. . Carefully consider travel plans you have or are making. . If you are planning any travel outside or inside the [place], visit the CDC's Travelers' Health webpage for the latest health notices. . If you have some symptoms but not all symptoms, continue to monitor at home and seek medical attention if your symptoms worsen. . If you are having a medical emergency, call 911.   [place] / e-Visit: [email or web address]         [facility]: [place]  [facility]: [place] [facility]: [place] Discharge Instructions for Patients Receiving Chemotherapy  Today you received the following chemotherapy agents Imfinzi  To help prevent nausea and vomiting after your treatment, we encourage you to take your nausea medication as directed.    If you develop nausea and vomiting that is not controlled by your nausea medication, call the clinic.   BELOW ARE  SYMPTOMS THAT SHOULD BE REPORTED IMMEDIATELY:  *FEVER GREATER THAN 100.5 F  *CHILLS WITH OR WITHOUT FEVER  NAUSEA AND VOMITING THAT IS NOT CONTROLLED WITH YOUR NAUSEA MEDICATION  *UNUSUAL SHORTNESS OF BREATH  *UNUSUAL BRUISING OR BLEEDING  TENDERNESS IN MOUTH AND THROAT WITH OR WITHOUT PRESENCE OF ULCERS  *URINARY PROBLEMS  *BOWEL PROBLEMS  UNUSUAL RASH Items with * indicate a potential emergency and should be followed up as soon as possible.  Feel free to call the clinic should you have any questions or concerns. The clinic phone number is [phone].  Please show the [place] at check-in to the Emergency Department and triage nurse.

## 2018-09-09 MED ORDER — DIPHENHYDRAMINE HCL 50 MG/ML IJ SOLN
INTRAMUSCULAR | Status: AC
  Filled 2018-09-09: qty 1

## 2018-09-09 MED ORDER — FAMOTIDINE IN NACL 20-0.9 MG/50ML-% IV SOLN
INTRAVENOUS | Status: AC
  Filled 2018-09-09: qty 50

## 2018-09-20 ENCOUNTER — Telehealth: Payer: Self-pay | Admitting: Hematology

## 2018-09-20 NOTE — Telephone Encounter (Signed)
Returned call re message received to reschedule 7/14 appointments. Per patient moved appointments to 7/15. Patient has new date/time.

## 2018-09-21 ENCOUNTER — Ambulatory Visit: Payer: PPO

## 2018-09-21 ENCOUNTER — Inpatient Hospital Stay: Payer: PPO

## 2018-09-21 ENCOUNTER — Other Ambulatory Visit: Payer: PPO

## 2018-09-21 ENCOUNTER — Ambulatory Visit: Payer: PPO | Admitting: Hematology

## 2018-09-21 NOTE — Progress Notes (Signed)
[name]    HEMATOLOGY/ONCOLOGY CLINIC NOTE  Date of Service: 09/22/2018   Patient Care Team: [name], MD as PCP - General (Family Medicine)  CHIEF COMPLAINTS/PURPOSE OF CONSULTATION:  F/u for continued management of Non-Small Cell lung Carcinoma  HISTORY OF PRESENTING ILLNESS:   Kayla Wyatt is a wonderful 63 y.o. female who has been referred to [place] by Dr [name] for evaluation and management of Non-Small Cell Carcinoma. She is accompanied today by her husband. The pt reports that she is doing well overall.   Patient recently had a significant hospitalization on 04/02/2017 - as per DC summary by [name] " with abdominal pain, hematemesis, melena and ABLA due to GIB.  She underwent clipping of gastric ulcer and received multiple units PRBC but continued to have drop in H/H with bloody stool. NM GI scan was negative for bleeding and she underwent colonoscopy by Dr. [name] 1/31 showing diverticulosis but no signs of bleeding. Hospital course significant for R- gastrocnemius DVT, PAF, incidental findings of mediastinal mass as well as brief episode of hematuria. She was cleared to start on Xarelto on 02/01 for treatment of DVT. On 2/2 am she developed hematemesis with maroon stools and hypotension requiring fluid bolus as well as 2 units PRBCs. She continued to decline requiring intubation, pressors as well as reversal of anticoagulation with Kcentra on 02/3. She underwent UGI revealing large clot with fresh blood in stomach and underwent mesenteric arteriogram with percutaneous coil embolization fo distal tributary of left gastric artery and placement of IVC filter by interventional radiology.   A fib with RVR felt to be due to hemorrhagic shock and she converted to NSR. [name] felt no further cardiac work up indicated and did not recommend initiating anticoagulation in short term. To follow up with [name] after PET scan and routine biopsy after medically stable. She  continued to have bleeding and underwent visceral angiogram with embolization of inferior division of splenic artery and associated gastric arteries on 2/4. No surgical intervention needed per [name] with recommendations to continue monitoring H/H as well as for recurrence of hematochezia. She tolerated extubation on 2/6 and respiratory status improving.  On 2/11, she has episode of unresponsiveness with jerking of bilateral limbs that lasted about 3 minutes. She had amnesia of events but was back to baseline. EEG revealed "focal slowing over the right temporo-occipital regionandoccasional epileptiform discharges over the right occipital region". Head CT reviewed, unremarkable for acute intracranial process. MRI brain done revealing mild biparietal signal abnormality suspicious for PRES. [name] recommended starting patient on Keppra BID with repeat MRI in 2 weeks. New onset seizure likely provoked by PRES--if resolved--Ok to take patient off Keppra. Patient with resultant generalized weakness. CIR recommended due to functional deficits. "   Later as outpatient she had a PET scan on 05/14/17 which showed a hypermetabolic mediastinal mass which was subsequently biopsied with a bronchoscopy on 05/19/17. She notes that she had smoked for about 35-40 years, half a pack of a day. She reports that she has not had any other symptoms related to her smoking; she has never had to be on home oxygen or inhalers. She has recently begun Symbicort. While being in the hospital she lost about 20 lbs but has maintained her weight since being discharged.  The pt and her husband note that she is concerned about what to do if she is not able to go back to work after her 90-day leave. We recommended that they speak with  our social workers here to explore the available options for insurance coverage /disability application etc.  Of note prior to the patient's visit today, pt has had PET/CT completed on 05/14/17 with  results revealing 1. The known mediastinal mass is intensely hypermetabolic, worrisome for malignancy. This could reflect lymphoma or small cell lung cancer. Tissue sampling recommended.2. No peripheral lung mass identified. New patchy ground-glass opacity in the lingula is mildly hypermetabolic and likely inflammatory.  On 05/19/17 the pt had a Fine Needle Aspiration which revealed Non-Small cell lung carcinoma.  Most recent lab results (05/19/17) of CBC  is as follows: all values are WNL except for RBC at 2.90, Hgb at 8.6, HCT at 28.0, RDW at 16.4.  On review of systems, pt reports knee pain, and denies abdominal pains, leg swelling, SOB, CP, and any other symptoms.   On PMHx the pt reports acute blood loss anemia, GI hemorrhage with melena, hypercholesteremia, HTN, persistent atrial fibrillation with rapid ventricular response, Vitamin D deficiency, denies liver and kidney problems. On Social Hx the pt reports having smoked for 35-40 years of half a pack per day. She quit smoking on 03/31/17.  On Family Hx the pt reports that her father had heart disease.   Interval History:  Kayla Wyatt returns today regarding her Non-Small Cell Carcinoma. The patient's last visit with [place] was on 08/24/2018. The pt reports that she is doing well overall.  She continues with her 17th cycle durvalumab. The pt has no prohibitive toxicities from continuing this treatment at this time.  The pt reports that she is taking appropriate precautions against the spread of COVID-19  Lab results today (09/22/18) of CBC w/diff and CMP is as follows: all values are WNL except for RBC at 3.63, hemoglobin at 11.6, HCT at 35.8, and glucose at 118.  On review of systems, pt denies fevers, chills, night sweats, swelling, bowel changes, and any other symptoms.    MEDICAL HISTORY:  Past Medical History:  Diagnosis Date  . Acute blood loss anemia 04/02/2017  . Anxiety   . Gastrointestinal hemorrhage with melena   .  Headache   . Hypercholesteremia   . Hypertension   . Persistent atrial fibrillation with rapid ventricular response 04/07/2017  . Seizures ([place])    04/2017 while in hospital at [facility]  . Vitamin D deficiency     SURGICAL HISTORY: Past Surgical History:  Procedure Laterality Date  . COLONOSCOPY Left 04/09/2017   Procedure: COLONOSCOPY;  Surgeon: Carol Ada, MD;  Location: [place];  Service: Endoscopy;  Laterality: Left;  . ESOPHAGOGASTRODUODENOSCOPY N/A 04/03/2017   Procedure: ESOPHAGOGASTRODUODENOSCOPY (EGD);  Surgeon: Carol Ada, MD;  Location: [place];  Service: Endoscopy;  Laterality: N/A;  . ESOPHAGOGASTRODUODENOSCOPY (EGD) WITH PROPOFOL N/A 04/12/2017   Procedure: ESOPHAGOGASTRODUODENOSCOPY (EGD) WITH PROPOFOL;  Surgeon: Milus Banister, MD;  Location[facility] ENDOSCOPY;  Service: Endoscopy;  Laterality: N/A;  . IR ANGIOGRAM SELECTIVE EACH ADDITIONAL VESSEL  04/12/2017  . IR ANGIOGRAM SELECTIVE EACH ADDITIONAL VESSEL  04/13/2017  . IR ANGIOGRAM SELECTIVE EACH ADDITIONAL VESSEL  04/13/2017  . IR ANGIOGRAM SELECTIVE EACH ADDITIONAL VESSEL  04/13/2017  . IR ANGIOGRAM VISCERAL SELECTIVE  04/12/2017  . IR ANGIOGRAM VISCERAL SELECTIVE  04/13/2017  . IR EMBO ART  VEN HEMORR LYMPH EXTRAV  INC GUIDE ROADMAPPING  04/12/2017  . IR EMBO ART  VEN HEMORR LYMPH EXTRAV  INC GUIDE ROADMAPPING  04/13/2017  . IR FLUORO GUIDE CV LINE RIGHT  04/13/2017  . IR FLUORO GUIDE PORT INSERTION RIGHT  07/27/2017  . IR IVC FILTER PLMT / S&I /IMG GUID/MOD SED  04/12/2017  . IR US GUIDE VASC ACCESS RIGHT  04/12/2017  . IR US GUIDE VASC ACCESS RIGHT  04/13/2017  . IR US GUIDE VASC ACCESS RIGHT  04/13/2017  . IR US GUIDE VASC ACCESS RIGHT  07/27/2017  . LEG SURGERY  1974   Blood Clot Removal   . VIDEO BRONCHOSCOPY WITH ENDOBRONCHIAL ULTRASOUND N/A 05/19/2017   Procedure: VIDEO BRONCHOSCOPY WITH ENDOBRONCHIAL ULTRASOUND;  Surgeon: Grace Isaac, MD;  Location: [place];  Service: Thoracic;  Laterality: N/A;    SOCIAL HISTORY:  Social History   Socioeconomic History  . Marital status: Single    Spouse name: Not on file  . Number of children: Not on file  . Years of education: Not on file  . Highest education level: Not on file  Occupational History  . Occupation: Has to lift heavy boxes at times.    Employer: [employer]  Social Needs  . Financial resource strain: Not on file  . Food insecurity    Worry: Not on file    Inability: Not on file  . Transportation needs    Medical: Not on file    Non-medical: Not on file  Tobacco Use  . Smoking status: Former Smoker    Packs/day: 0.50    Years: 40.00    Pack years: 20.00    Types: Cigarettes    Quit date: 03/31/2017    Years since quitting: 1.4  . Smokeless tobacco: Never Used  Substance and Sexual Activity  . Alcohol use: No    Frequency: Never  . Drug use: No  . Sexual activity: Not Currently  Lifestyle  . Physical activity    Days per week: Not on file    Minutes per session: Not on file  . Stress: Not on file  Relationships  . Social Herbalist on phone: Not on file    Gets together: Not on file    Attends religious service: Not on file    Active member of club or organization: Not on file    Attends meetings of clubs or organizations: Not on file    Relationship status: Not on file  . Intimate partner violence    Fear of current or ex partner: No    Emotionally abused: No    Physically abused: No    Forced sexual activity: No  Other Topics Concern  . Not on file  Social History Narrative  . Not on file    FAMILY HISTORY: Family History  Problem Relation Age of Onset  . Heart disease Father     ALLERGIES:  has No Known Allergies.  MEDICATIONS:  Current Outpatient Medications  Medication Sig Dispense Refill  . acetaminophen (TYLENOL) 325 MG tablet Take 1-2 tablets (325-650 mg total) by mouth every 4 (four) hours as needed for mild pain.    . budesonide-formoterol (SYMBICORT) 80-4.5 MCG/ACT inhaler Inhale 2 puffs into  the lungs 2 (two) times daily. 1 Inhaler 12  . cholecalciferol (VITAMIN D) 1000 units tablet Take 1,000 Units by mouth 2 (two) times daily.    [name] levETIRAcetam (KEPPRA) 500 MG tablet TK 1 T PO BID  6  . levothyroxine (SYNTHROID) 88 MCG tablet Take 1 tablet (88 mcg total) by mouth daily. 60 tablet 3  . lidocaine-prilocaine (EMLA) cream Apply to affected area once 30 g 3  . lisinopril-hydrochlorothiazide (PRINZIDE,ZESTORETIC) 20-12.5 MG tablet Take 1 tablet by mouth daily.    [name]  LORazepam (ATIVAN) 0.5 MG tablet Take 2 tablets (1 mg total) by mouth once as needed for up to 1 dose (prior to PET/CT and MRI brain for anxiety). 6 tablet 0  . ondansetron (ZOFRAN) 8 MG tablet Take 1 tablet (8 mg total) by mouth 2 (two) times daily as needed for refractory nausea / vomiting. Start on day 3 after chemo. 30 tablet 1  . pantoprazole (PROTONIX) 40 MG tablet TK 1 T PO BID  6  . polycarbophil (FIBERCON) 625 MG tablet Take 2 tablets (1,250 mg total) by mouth 2 (two) times daily. 120 tablet 0  . potassium chloride SA (K-DUR,KLOR-CON) 20 MEQ tablet Take 1 tablet (20 mEq total) by mouth 2 (two) times daily. 60 tablet 0  . saccharomyces boulardii (FLORASTOR) 250 MG capsule Take 250 mg by mouth 2 (two) times daily.    . sucralfate (CARAFATE) 1 g tablet Crush and mix in 10-15 mL of water prior to swallowing qAC and HS (Patient not taking: Reported on 08/19/2018) 120 tablet 1   No current facility-administered medications for this visit.    Facility-Administered Medications Ordered in Other Visits  Medication Dose Route Frequency Provider Last Rate Last Dose  . durvalumab (IMFINZI) 740 mg in sodium chloride 0.9 % 100 mL chemo infusion  10 mg/kg (Order-Specific) Intravenous Once Brunetta Genera, MD      . heparin lock flush 100 unit/mL  500 Units Intracatheter Once PRN Brunetta Genera, MD      . sodium chloride flush (NS) 0.9 % injection 10 mL  10 mL Intracatheter PRN Brunetta Genera, MD   10 mL at 08/10/18  1558  . sodium chloride flush (NS) 0.9 % injection 10 mL  10 mL Intracatheter PRN Brunetta Genera, MD        REVIEW OF SYSTEMS:   A 10+ POINT REVIEW OF SYSTEMS WAS OBTAINED including neurology, dermatology, psychiatry, cardiac, respiratory, lymph, extremities, GI, GU, Musculoskeletal, constitutional, breasts, reproductive, HEENT.  All pertinent positives are noted in the HPI.  All others are negative.     PHYSICAL EXAMINATION: ECOG PERFORMANCE STATUS: 1 - Symptomatic but completely ambulatory  Today's Vitals   09/22/18 0827  BP: 128/67  Pulse: 100  Resp: 18  Temp: 98.9 F (37.2 C)  TempSrc: Oral  SpO2: 94%  Weight: 160 lb 1.6 oz (72.6 kg)  Height: _0  (1.676 m)     Wt Readings from Last 3 Encounters:  09/22/18 160 lb 1.6 oz (72.6 kg)  08/24/18 162 lb (73.5 kg)  08/19/18 164 lb 12.8 oz (74.8 kg)   Body mass index is 25.84 kg/m.  GENERAL:alert, in no acute distress and comfortable SKIN: no acute rashes, no significant lesions EYES: conjunctiva are pink and non-injected, sclera anicteric OROPHARYNX: MMM, no exudates, no oropharyngeal erythema or ulceration NECK: supple, no JVD LYMPH:  no palpable lymphadenopathy in the cervical, axillary or inguinal regions LUNGS: clear to auscultation b/l with normal respiratory effort HEART: regular rate & rhythm ABDOMEN:  normoactive bowel sounds , non tender, not distended. Extremity: no pedal edema PSYCH: alert & oriented x 3 with fluent speech NEURO: no focal motor/sensory deficits    LABORATORY DATA:  I have reviewed the data as listed  . CBC Latest Ref Rng & Units 09/22/2018 09/07/2018 08/24/2018  WBC 4.0 - 10.5 K/uL 5.6 6.3 5.5  Hemoglobin 12.0 - 15.0 g/dL 11.6(L) 10.7(L) 11.4(L)  Hematocrit 36.0 - 46.0 % 35.8(L) 32.0(L) 34.9(L)  Platelets 150 - 400 K/uL 220 272 208    .  CMP Latest Ref Rng & Units 09/22/2018 09/07/2018 08/24/2018  Glucose 70 - 99 mg/dL 118(H) 115(H) 93  BUN 8 - 23 mg/dL _0 Creatinine 0.44 -  1.00 mg/dL 0.85 0.86 0.91  Sodium 135 - 145 mmol/L 141 141 141  Potassium 3.5 - 5.1 mmol/L 3.7 3.3(L) 3.6  Chloride 98 - 111 mmol/L 108 106 107  CO2 22 - 32 mmol/L _1 Calcium 8.9 - 10.3 mg/dL 9.0 8.9 8.9  Total Protein 6.5 - 8.1 g/dL 6.9 7.2 7.3  Total Bilirubin 0.3 - 1.2 mg/dL 0.3 0.2(L) 0.2(L)  Alkaline Phos 38 - 126 U/L 66 83 73  AST 15 - 41 U/L _2 ALT 0 - 44 U/L _3 05/19/17 Fine Needle Aspiration:  05/19/17 Bronchial Washing Specimen B:   RADIOGRAPHIC STUDIES: I have personally reviewed the radiological images as listed and agreed with the findings in the report. No results found. MRI brain 04/21/2017: IMPRESSION: 1. Motion degraded examination. 2. 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 3. Otherwise negative MRI of the head with and without contrast for age.  Electronically Signed   By: Elon Alas M.D.   On: 04/22/2017 00:34    ASSESSMENT & PLAN:   63 y.o. female with  1. Recently diagnosed Non-Small Cell Carcinoma on bronchoscopy/cytology.  Not enough tissue for further characterization or mutation testing. Presenting with mediastinal nodal mass - TxN2 Mx (Atleast Stage III disease) ? Lingular primary vs inflammation. No overt evidence of metastatic disease.  -EGFR mutation studies on blood negative. Patient is s/p 3 cycles of Carboplatin/taxol -resolution left upper lobe ground-glass opacity has resolved in the interval. 08/19/17 PET revealed  No significant change in size of hypermetabolic AP window mass. The degree of FDG uptake is slightly increased in the interval. 2. Persistent hypermetabolic focus involving the distal aspect of the tongue within SUV max of 16.46. Similar to previous exam. (no clinical co-relate). Lung involvement has decreased.   08/19/17 Brain MRI revealed No evidence of intracranial metastases. Interval resolution of bilateral parietal signal abnormality  favoring PRES  Completed concurrent Carbo/taxol chemotherapy + RT  12/24/17 PET/CT revealed Marked interval response to therapy with large AP window lymph nodes seen on the previous study almost imperceptible on today's exam. Hypermetabolism within this lesion has resolved with FDG accumulation now at background blood pool levels. 2. Small focus of hypermetabolism in the anal region, similar to prior and indeterminate by PET imaging. 3. Hypermetabolic focus at the distal tongue has decreased in the Interval. 4. Marked pelvic floor laxity with cystocele. 5. Stable tiny left lung nodules. 6.  Aortic Atherosclerois. 7.  Emphysema.   04/03/17 endoscopy did not reveal esophageal thickening    04/27/18 CT Chest revealed Stable exam. No new or progressive findings. 2. Stable mild circumferential wall thickening mid esophagus. Esophagitis is a consideration. 3. Stable tiny left pulmonary nodules. 4.  Emphysema. 5.  Aortic Atherosclerois.  2. Bone Pain from neulasta - managed with Tylenol  3. Previous h/ot SZ -currently on Keppra. MRI brain - resolution of findings of PRES  4. H/o rt calf DVT - off anticoagulation due to massive GI bleeding. S/p IVC filter. No issues currently  5. S/p previous GI bleeding from Gastric ulcer --needing clipping and arterial embolization. On PPI BID Ulcers seen to be healed with recent April 2020 repeat endoscopy  6. Low Magnesium Mg 1.6 -On oral magnesium 237m BID  7. H/o ctx /steroid  related thrush --now resolved.  8. Elevated TSH/Hypothyroidism -- ? Related to immunotherapy 07/13/18 Thyroid functions revealed TSH at 62.91 and Free T4 at 0.30 Began low dose 13mg Levothyroxine and provide endocrinology referral for further optimization. Recommend taking Levothyroxine 1.5 hours before to breakfast with only water and no other medications.  F/u with Dr. SKelton Pillarin [facility]ocrin[facility]   PLAN: -Discussed pt labwork today, 09/22/18; all values are WNL except for RBC at  3.63, hemoglobin at 11.6, HCT at 35.8, and glucose at 118. -Discussed Levothyroxine dosages increased to 88 MCG -The pt has no prohibitive toxicities from continuing durvalumab at this time.   - no labs or clinical symptoms suggestive of lung cancer progression at this time.  FOLLOW UP: -Plz schedule next 4 doses of Durvalumab with labs q2weeks -MD visit with every other treatment.    All of the patients questions were answered with apparent satisfaction. The patient knows to call the clinic with any problems, questions or concerns.  The total time spent in the appt was 15 minutes and more than 50% was on counseling and direct patient cares.    GSullivan LoneMD Kayla AAHIVMS SMercy Medical Center - MercedCPrg Dallas Asc LPHematology/Oncology Physician CShriners Hospital For Children (Office):       [phone](Work cell):  [phone](Fax):           [phone] 09/22/2018 10:03 AM   I, AJacqualyn Posey am acting as a sEducation administratorfor Dr. GSullivan Lone   .I have reviewed the above documentation for accuracy and completeness, and I agree with the above. .Brunetta GeneraMD

## 2018-09-22 ENCOUNTER — Telehealth: Payer: Self-pay | Admitting: Hematology

## 2018-09-22 ENCOUNTER — Other Ambulatory Visit: Payer: Self-pay

## 2018-09-22 ENCOUNTER — Inpatient Hospital Stay: Payer: PPO

## 2018-09-22 ENCOUNTER — Inpatient Hospital Stay (HOSPITAL_BASED_OUTPATIENT_CLINIC_OR_DEPARTMENT_OTHER): Payer: PPO | Admitting: Hematology

## 2018-09-22 ENCOUNTER — Inpatient Hospital Stay: Payer: PPO | Attending: Hematology

## 2018-09-22 VITALS — BP 128/67 | HR 100 | Temp 98.9°F | Resp 18 | Ht 66.0 in | Wt 160.1 lb

## 2018-09-22 DIAGNOSIS — M898X9 Other specified disorders of bone, unspecified site: Secondary | ICD-10-CM | POA: Diagnosis not present

## 2018-09-22 DIAGNOSIS — Z79899 Other long term (current) drug therapy: Secondary | ICD-10-CM | POA: Diagnosis not present

## 2018-09-22 DIAGNOSIS — C3490 Malignant neoplasm of unspecified part of unspecified bronchus or lung: Secondary | ICD-10-CM

## 2018-09-22 DIAGNOSIS — E032 Hypothyroidism due to medicaments and other exogenous substances: Secondary | ICD-10-CM | POA: Diagnosis not present

## 2018-09-22 DIAGNOSIS — Z86718 Personal history of other venous thrombosis and embolism: Secondary | ICD-10-CM

## 2018-09-22 DIAGNOSIS — Z87891 Personal history of nicotine dependence: Secondary | ICD-10-CM

## 2018-09-22 DIAGNOSIS — Z5112 Encounter for antineoplastic immunotherapy: Secondary | ICD-10-CM | POA: Diagnosis not present

## 2018-09-22 DIAGNOSIS — Z95828 Presence of other vascular implants and grafts: Secondary | ICD-10-CM

## 2018-09-22 DIAGNOSIS — C383 Malignant neoplasm of mediastinum, part unspecified: Secondary | ICD-10-CM

## 2018-09-22 LAB — CMP (CANCER CENTER ONLY)
ALT: 12 U/L (ref 0–44)
AST: 15 U/L (ref 15–41)
Albumin: 3.7 g/dL (ref 3.5–5.0)
Alkaline Phosphatase: 66 U/L (ref 38–126)
Anion gap: 10 (ref 5–15)
BUN: 23 mg/dL (ref 8–23)
CO2: 23 mmol/L (ref 22–32)
Calcium: 9 mg/dL (ref 8.9–10.3)
Chloride: 108 mmol/L (ref 98–111)
Creatinine: 0.85 mg/dL (ref 0.44–1.00)
GFR, Est AFR Am: >60 mL/min (ref >60)
GFR, Estimated: >60 mL/min (ref >60)
Glucose, Bld: 118 mg/dL — ABNORMAL HIGH (ref 70–99)
Potassium: 3.7 mmol/L (ref 3.5–5.1)
Sodium: 141 mmol/L (ref 135–145)
Total Bilirubin: 0.3 mg/dL (ref 0.3–1.2)
Total Protein: 6.9 g/dL (ref 6.5–8.1)

## 2018-09-22 LAB — CBC WITH DIFFERENTIAL/PLATELET
Abs Immature Granulocytes: 0.02 10*3/uL (ref 0.00–0.07)
Basophils Absolute: 0 10*3/uL (ref 0.0–0.1)
Basophils Relative: 1 %
Eosinophils Absolute: 0.2 10*3/uL (ref 0.0–0.5)
Eosinophils Relative: 4 %
HCT: 35.8 % — ABNORMAL LOW (ref 36.0–46.0)
Hemoglobin: 11.6 g/dL — ABNORMAL LOW (ref 12.0–15.0)
Immature Granulocytes: 0 %
Lymphocytes Relative: 20 %
Lymphs Abs: 1.1 10*3/uL (ref 0.7–4.0)
MCH: 32 pg (ref 26.0–34.0)
MCHC: 32.4 g/dL (ref 30.0–36.0)
MCV: 98.6 fL (ref 80.0–100.0)
Monocytes Absolute: 0.5 10*3/uL (ref 0.1–1.0)
Monocytes Relative: 9 %
Neutro Abs: 3.8 10*3/uL (ref 1.7–7.7)
Neutrophils Relative %: 66 %
Platelets: 220 10*3/uL (ref 150–400)
RBC: 3.63 MIL/uL — ABNORMAL LOW (ref 3.87–5.11)
RDW: 14 % (ref 11.5–15.5)
WBC: 5.6 10*3/uL (ref 4.0–10.5)
nRBC: 0 % (ref 0.0–0.2)

## 2018-09-22 MED ORDER — SODIUM CHLORIDE 0.9 % IV SOLN
Freq: Once | INTRAVENOUS | Status: AC
  Administered 2018-09-22: 10:00:00 via INTRAVENOUS
  Filled 2018-09-22: qty 250

## 2018-09-22 MED ORDER — SODIUM CHLORIDE 0.9% FLUSH
10.0000 mL | INTRAVENOUS | Status: DC | PRN
  Administered 2018-09-22: 10 mL
  Filled 2018-09-22: qty 10

## 2018-09-22 MED ORDER — SODIUM CHLORIDE 0.9 % IV SOLN
10.0000 mg/kg | Freq: Once | INTRAVENOUS | Status: AC
  Administered 2018-09-22: 740 mg via INTRAVENOUS
  Filled 2018-09-22: qty 4.8

## 2018-09-22 MED ORDER — HEPARIN SOD (PORK) LOCK FLUSH 100 UNIT/ML IV SOLN
500.0000 [IU] | Freq: Once | INTRAVENOUS | Status: AC | PRN
  Administered 2018-09-22: 500 [IU]
  Filled 2018-09-22: qty 5

## 2018-09-22 NOTE — Patient Instructions (Signed)
[facility] Discharge Instructions for Patients Receiving Chemotherapy  Today you received the following chemotherapy agents: Imfinzi.  To help prevent nausea and vomiting after your treatment, we encourage you to take your nausea medication as directed.   If you develop nausea and vomiting that is not controlled by your nausea medication, call the clinic.   BELOW ARE SYMPTOMS THAT SHOULD BE REPORTED IMMEDIATELY:  *FEVER GREATER THAN 100.5 F  *CHILLS WITH OR WITHOUT FEVER  NAUSEA AND VOMITING THAT IS NOT CONTROLLED WITH YOUR NAUSEA MEDICATION  *UNUSUAL SHORTNESS OF BREATH  *UNUSUAL BRUISING OR BLEEDING  TENDERNESS IN MOUTH AND THROAT WITH OR WITHOUT PRESENCE OF ULCERS  *URINARY PROBLEMS  *BOWEL PROBLEMS  UNUSUAL RASH Items with * indicate a potential emergency and should be followed up as soon as possible.  Feel free to call the clinic should you have any questions or concerns. The clinic phone number is [phone].  Please show the CHEMO ALERT CARD at check-in to the Emergency Department and triage nurse.   

## 2018-09-22 NOTE — Telephone Encounter (Signed)
Scheduled appt per 7/15 los.

## 2018-10-05 ENCOUNTER — Inpatient Hospital Stay: Payer: PPO

## 2018-10-05 ENCOUNTER — Other Ambulatory Visit: Payer: Self-pay

## 2018-10-05 VITALS — BP 120/55 | HR 62 | Temp 99.0°F | Resp 18

## 2018-10-05 DIAGNOSIS — C3490 Malignant neoplasm of unspecified part of unspecified bronchus or lung: Secondary | ICD-10-CM

## 2018-10-05 DIAGNOSIS — Z95828 Presence of other vascular implants and grafts: Secondary | ICD-10-CM

## 2018-10-05 DIAGNOSIS — Z5112 Encounter for antineoplastic immunotherapy: Secondary | ICD-10-CM

## 2018-10-05 DIAGNOSIS — E032 Hypothyroidism due to medicaments and other exogenous substances: Secondary | ICD-10-CM

## 2018-10-05 DIAGNOSIS — C383 Malignant neoplasm of mediastinum, part unspecified: Secondary | ICD-10-CM

## 2018-10-05 DIAGNOSIS — Z5111 Encounter for antineoplastic chemotherapy: Secondary | ICD-10-CM

## 2018-10-05 LAB — CMP (CANCER CENTER ONLY)
ALT: 10 U/L (ref 0–44)
AST: 17 U/L (ref 15–41)
Albumin: 3.8 g/dL (ref 3.5–5.0)
Alkaline Phosphatase: 58 U/L (ref 38–126)
Anion gap: 10 (ref 5–15)
BUN: 38 mg/dL — ABNORMAL HIGH (ref 8–23)
CO2: 24 mmol/L (ref 22–32)
Calcium: 9.5 mg/dL (ref 8.9–10.3)
Chloride: 107 mmol/L (ref 98–111)
Creatinine: 1.08 mg/dL — ABNORMAL HIGH (ref 0.44–1.00)
GFR, Est AFR Am: >60 mL/min (ref >60)
GFR, Estimated: 54 mL/min — ABNORMAL LOW (ref >60)
Glucose, Bld: 90 mg/dL (ref 70–99)
Potassium: 3.9 mmol/L (ref 3.5–5.1)
Sodium: 141 mmol/L (ref 135–145)
Total Bilirubin: 0.3 mg/dL (ref 0.3–1.2)
Total Protein: 6.9 g/dL (ref 6.5–8.1)

## 2018-10-05 LAB — CBC WITH DIFFERENTIAL/PLATELET
Abs Immature Granulocytes: 0.01 10*3/uL (ref 0.00–0.07)
Basophils Absolute: 0 10*3/uL (ref 0.0–0.1)
Basophils Relative: 1 %
Eosinophils Absolute: 0.2 10*3/uL (ref 0.0–0.5)
Eosinophils Relative: 4 %
HCT: 32.5 % — ABNORMAL LOW (ref 36.0–46.0)
Hemoglobin: 10.9 g/dL — ABNORMAL LOW (ref 12.0–15.0)
Immature Granulocytes: 0 %
Lymphocytes Relative: 23 %
Lymphs Abs: 1.5 10*3/uL (ref 0.7–4.0)
MCH: 32.1 pg (ref 26.0–34.0)
MCHC: 33.5 g/dL (ref 30.0–36.0)
MCV: 95.6 fL (ref 80.0–100.0)
Monocytes Absolute: 0.5 10*3/uL (ref 0.1–1.0)
Monocytes Relative: 8 %
Neutro Abs: 4.2 10*3/uL (ref 1.7–7.7)
Neutrophils Relative %: 64 %
Platelets: 198 10*3/uL (ref 150–400)
RBC: 3.4 MIL/uL — ABNORMAL LOW (ref 3.87–5.11)
RDW: 13.9 % (ref 11.5–15.5)
WBC: 6.4 10*3/uL (ref 4.0–10.5)
nRBC: 0 % (ref 0.0–0.2)

## 2018-10-05 LAB — TSH: TSH: 2.657 u[IU]/mL (ref 0.308–3.960)

## 2018-10-05 LAB — T4, FREE: Free T4: 1.28 ng/dL — ABNORMAL HIGH (ref 0.61–1.12)

## 2018-10-05 MED ORDER — SODIUM CHLORIDE 0.9 % IV SOLN
10.0000 mg/kg | Freq: Once | INTRAVENOUS | Status: AC
  Administered 2018-10-05: 740 mg via INTRAVENOUS
  Filled 2018-10-05: qty 4.8

## 2018-10-05 MED ORDER — HEPARIN SOD (PORK) LOCK FLUSH 100 UNIT/ML IV SOLN
500.0000 [IU] | Freq: Once | INTRAVENOUS | Status: AC | PRN
  Administered 2018-10-05: 500 [IU]
  Filled 2018-10-05: qty 5

## 2018-10-05 MED ORDER — SODIUM CHLORIDE 0.9% FLUSH
10.0000 mL | INTRAVENOUS | Status: DC | PRN
  Administered 2018-10-05: 10 mL
  Filled 2018-10-05: qty 10

## 2018-10-05 MED ORDER — SODIUM CHLORIDE 0.9 % IV SOLN
Freq: Once | INTRAVENOUS | Status: AC
  Administered 2018-10-05: 14:00:00 via INTRAVENOUS
  Filled 2018-10-05: qty 250

## 2018-10-05 NOTE — Patient Instructions (Signed)
[facility] Discharge Instructions for Patients Receiving Chemotherapy  Today you received the following chemotherapy agents: Imfinzi.  To help prevent nausea and vomiting after your treatment, we encourage you to take your nausea medication as directed.   If you develop nausea and vomiting that is not controlled by your nausea medication, call the clinic.   BELOW ARE SYMPTOMS THAT SHOULD BE REPORTED IMMEDIATELY:  *FEVER GREATER THAN 100.5 F  *CHILLS WITH OR WITHOUT FEVER  NAUSEA AND VOMITING THAT IS NOT CONTROLLED WITH YOUR NAUSEA MEDICATION  *UNUSUAL SHORTNESS OF BREATH  *UNUSUAL BRUISING OR BLEEDING  TENDERNESS IN MOUTH AND THROAT WITH OR WITHOUT PRESENCE OF ULCERS  *URINARY PROBLEMS  *BOWEL PROBLEMS  UNUSUAL RASH Items with * indicate a potential emergency and should be followed up as soon as possible.  Feel free to call the clinic should you have any questions or concerns. The clinic phone number is [phone].  Please show the CHEMO ALERT CARD at check-in to the Emergency Department and triage nurse.   

## 2018-10-18 NOTE — Progress Notes (Signed)
[name]    HEMATOLOGY/ONCOLOGY CLINIC NOTE  Date of Service: 10/18/2018   Patient Care Team: [name], MD as PCP - General (Family Medicine)  CHIEF COMPLAINTS/PURPOSE OF CONSULTATION:  F/u for continued management of Non-Small Cell lung Carcinoma  HISTORY OF PRESENTING ILLNESS:   Kayla Wyatt is a wonderful 63 y.o. female who has been referred to [place] by Dr [name] for evaluation and management of Non-Small Cell Carcinoma. She is accompanied today by her husband. The pt reports that she is doing well overall.   Patient recently had a significant hospitalization on 04/02/2017 - as per DC summary by [name] " with abdominal pain, hematemesis, melena and ABLA due to GIB.  She underwent clipping of gastric ulcer and received multiple units PRBC but continued to have drop in H/H with bloody stool. NM GI scan was negative for bleeding and she underwent colonoscopy by Dr. [name] 1/31 showing diverticulosis but no signs of bleeding. Hospital course significant for R- gastrocnemius DVT, PAF, incidental findings of mediastinal mass as well as brief episode of hematuria. She was cleared to start on Xarelto on 02/01 for treatment of DVT. On 2/2 am she developed hematemesis with maroon stools and hypotension requiring fluid bolus as well as 2 units PRBCs. She continued to decline requiring intubation, pressors as well as reversal of anticoagulation with Kcentra on 02/3. She underwent UGI revealing large clot with fresh blood in stomach and underwent mesenteric arteriogram with percutaneous coil embolization fo distal tributary of left gastric artery and placement of IVC filter by interventional radiology.   A fib with RVR felt to be due to hemorrhagic shock and she converted to NSR. [name] felt no further cardiac work up indicated and did not recommend initiating anticoagulation in short term. To follow up with [name] after PET scan and routine biopsy after medically stable. She  continued to have bleeding and underwent visceral angiogram with embolization of inferior division of splenic artery and associated gastric arteries on 2/4. No surgical intervention needed per [name] with recommendations to continue monitoring H/H as well as for recurrence of hematochezia. She tolerated extubation on 2/6 and respiratory status improving.  On 2/11, she has episode of unresponsiveness with jerking of bilateral limbs that lasted about 3 minutes. She had amnesia of events but was back to baseline. EEG revealed "focal slowing over the right temporo-occipital regionandoccasional epileptiform discharges over the right occipital region". Head CT reviewed, unremarkable for acute intracranial process. MRI brain done revealing mild biparietal signal abnormality suspicious for PRES. [name] recommended starting patient on Keppra BID with repeat MRI in 2 weeks. New onset seizure likely provoked by PRES--if resolved--Ok to take patient off Keppra. Patient with resultant generalized weakness. CIR recommended due to functional deficits. "  Later as outpatient she had a PET scan on 05/14/17 which showed a hypermetabolic mediastinal mass which was subsequently biopsied with a bronchoscopy on 05/19/17. She notes that she had smoked for about 35-40 years, half a pack of a day. She reports that she has not had any other symptoms related to her smoking; she has never had to be on home oxygen or inhalers. She has recently begun Symbicort. While being in the hospital she lost about 20 lbs but has maintained her weight since being discharged.  The pt and her husband note that she is concerned about what to do if she is not able to go back to work after her 90-day leave. We recommended that they speak with our  social workers here to explore the available options for insurance coverage Scientist, water quality.  Of note prior to the patient's visit today, pt has had PET/CT completed on 05/14/17 with  results revealing 1. The known mediastinal mass is intensely hypermetabolic, worrisome for malignancy. This could reflect lymphoma or small cell lung cancer. Tissue sampling recommended.2. No peripheral lung mass identified. New patchy ground-glass opacity in the lingula is mildly hypermetabolic and likely inflammatory.  On 05/19/17 the pt had a Fine Needle Aspiration which revealed Non-Small cell lung carcinoma.  Most recent lab results (05/19/17) of CBC  is as follows: all values are WNL except for RBC at 2.90, Hgb at 8.6, HCT at 28.0, RDW at 16.4.  On review of systems, pt reports knee pain, and denies abdominal pains, leg swelling, SOB, CP, and any other symptoms.   On PMHx the pt reports acute blood loss anemia, GI hemorrhage with melena, hypercholesteremia, HTN, persistent atrial fibrillation with rapid ventricular response, Vitamin D deficiency, denies liver and kidney problems. On Social Hx the pt reports having smoked for 35-40 years of half a pack per day. She quit smoking on 03/31/17.  On Family Hx the pt reports that her father had heart disease.   Interval History:  Kayla Wyatt returns today regarding her Non-Small Cell Carcinoma. The patient's last visit with [place] was on 09/22/2018. The pt reports that she is doing well overall.  The pt reports that she has been taking her thyroid medication as prescribed. Denies changes in breathing, weight changes, less swelling, skin rashes, belly pain, and diarrhea. She has been eating well and avoiding crowds. She has not smoked in over a year and a half.   Lab results today (10/20/2018) of CBC w/diff and CMP is as follows: all values are WNL except for glucose at 101, RBC at 3.49, HGB at 11.3, HCT at 34.2 10/20/2018 TSH is 1.193  On review of systems, pt reports eating well and denies changes in breathing, belly pain, weight changes, less swelling, skin rashes, diarrhea. and any other symptoms.   MEDICAL HISTORY:  Past Medical History:   Diagnosis Date  . Acute blood loss anemia 04/02/2017  . Anxiety   . Gastrointestinal hemorrhage with melena   . Headache   . Hypercholesteremia   . Hypertension   . Persistent atrial fibrillation with rapid ventricular response 04/07/2017  . Seizures ([place])    04/2017 while in hospital at [facility]  . Vitamin D deficiency     SURGICAL HISTORY: Past Surgical History:  Procedure Laterality Date  . COLONOSCOPY Left 04/09/2017   Procedure: COLONOSCOPY;  Surgeon: Carol Ada, MD;  Location: [place];  Service: Endoscopy;  Laterality: Left;  . ESOPHAGOGASTRODUODENOSCOPY N/A 04/03/2017   Procedure: ESOPHAGOGASTRODUODENOSCOPY (EGD);  Surgeon: Carol Ada, MD;  Location: [place];  Service: Endoscopy;  Laterality: N/A;  . ESOPHAGOGASTRODUODENOSCOPY (EGD) WITH PROPOFOL N/A 04/12/2017   Procedure: ESOPHAGOGASTRODUODENOSCOPY (EGD) WITH PROPOFOL;  Surgeon: Milus Banister, MD;  Location: [facility] ENDOSCOPY;  Service: Endoscopy;  Laterality: N/A;  . IR ANGIOGRAM SELECTIVE EACH ADDITIONAL VESSEL  04/12/2017  . IR ANGIOGRAM SELECTIVE EACH ADDITIONAL VESSEL  04/13/2017  . IR ANGIOGRAM SELECTIVE EACH ADDITIONAL VESSEL  04/13/2017  . IR ANGIOGRAM SELECTIVE EACH ADDITIONAL VESSEL  04/13/2017  . IR ANGIOGRAM VISCERAL SELECTIVE  04/12/2017  . IR ANGIOGRAM VISCERAL SELECTIVE  04/13/2017  . IR EMBO ART  VEN HEMORR LYMPH EXTRAV  INC GUIDE ROADMAPPING  04/12/2017  . IR EMBO ART  VEN HEMORR LYMPH EXTRAV  INC GUIDE  ROADMAPPING  04/13/2017  . IR FLUORO GUIDE CV LINE RIGHT  04/13/2017  . IR FLUORO GUIDE PORT INSERTION RIGHT  07/27/2017  . IR IVC FILTER PLMT / S&I /IMG GUID/MOD SED  04/12/2017  . IR US GUIDE VASC ACCESS RIGHT  04/12/2017  . IR US GUIDE VASC ACCESS RIGHT  04/13/2017  . IR US GUIDE VASC ACCESS RIGHT  04/13/2017  . IR US GUIDE VASC ACCESS RIGHT  07/27/2017  . LEG SURGERY  1974   Blood Clot Removal   . VIDEO BRONCHOSCOPY WITH ENDOBRONCHIAL ULTRASOUND N/A 05/19/2017   Procedure: VIDEO BRONCHOSCOPY WITH ENDOBRONCHIAL ULTRASOUND;   Surgeon: Grace Isaac, MD;  Location: [place];  Service: Thoracic;  Laterality: N/A;    SOCIAL HISTORY: Social History   Socioeconomic History  . Marital status: Single    Spouse name: Not on file  . Number of children: Not on file  . Years of education: Not on file  . Highest education level: Not on file  Occupational History  . Occupation: Has to lift heavy boxes at times.    Employer: [employer]  Social Needs  . Financial resource strain: Not on file  . Food insecurity    Worry: Not on file    Inability: Not on file  . Transportation needs    Medical: Not on file    Non-medical: Not on file  Tobacco Use  . Smoking status: Former Smoker    Packs/day: 0.50    Years: 40.00    Pack years: 20.00    Types: Cigarettes    Quit date: 03/31/2017    Years since quitting: 1.5  . Smokeless tobacco: Never Used  Substance and Sexual Activity  . Alcohol use: No    Frequency: Never  . Drug use: No  . Sexual activity: Not Currently  Lifestyle  . Physical activity    Days per week: Not on file    Minutes per session: Not on file  . Stress: Not on file  Relationships  . Social Herbalist on phone: Not on file    Gets together: Not on file    Attends religious service: Not on file    Active member of club or organization: Not on file    Attends meetings of clubs or organizations: Not on file    Relationship status: Not on file  . Intimate partner violence    Fear of current or ex partner: No    Emotionally abused: No    Physically abused: No    Forced sexual activity: No  Other Topics Concern  . Not on file  Social History Narrative  . Not on file    FAMILY HISTORY: Family History  Problem Relation Age of Onset  . Heart disease Father     ALLERGIES:  has No Known Allergies.  MEDICATIONS:  Current Outpatient Medications  Medication Sig Dispense Refill  . acetaminophen (TYLENOL) 325 MG tablet Take 1-2 tablets (325-650 mg total) by mouth every 4 (four)  hours as needed for mild pain.    . budesonide-formoterol (SYMBICORT) 80-4.5 MCG/ACT inhaler Inhale 2 puffs into the lungs 2 (two) times daily. 1 Inhaler 12  . cholecalciferol (VITAMIN D) 1000 units tablet Take 1,000 Units by mouth 2 (two) times daily.    [name] levETIRAcetam (KEPPRA) 500 MG tablet TK 1 T PO BID  6  . levothyroxine (SYNTHROID) 88 MCG tablet Take 1 tablet (88 mcg total) by mouth daily. 60 tablet 3  . lidocaine-prilocaine (EMLA) cream  Apply to affected area once 30 g 3  . lisinopril-hydrochlorothiazide (PRINZIDE,ZESTORETIC) 20-12.5 MG tablet Take 1 tablet by mouth daily.    [name] LORazepam (ATIVAN) 0.5 MG tablet Take 2 tablets (1 mg total) by mouth once as needed for up to 1 dose (prior to PET/CT and MRI brain for anxiety). 6 tablet 0  . ondansetron (ZOFRAN) 8 MG tablet Take 1 tablet (8 mg total) by mouth 2 (two) times daily as needed for refractory nausea / vomiting. Start on day 3 after chemo. 30 tablet 1  . pantoprazole (PROTONIX) 40 MG tablet TK 1 T PO BID  6  . polycarbophil (FIBERCON) 625 MG tablet Take 2 tablets (1,250 mg total) by mouth 2 (two) times daily. 120 tablet 0  . potassium chloride SA (K-DUR,KLOR-CON) 20 MEQ tablet Take 1 tablet (20 mEq total) by mouth 2 (two) times daily. 60 tablet 0  . saccharomyces boulardii (FLORASTOR) 250 MG capsule Take 250 mg by mouth 2 (two) times daily.    . sucralfate (CARAFATE) 1 g tablet Crush and mix in 10-15 mL of water prior to swallowing qAC and HS (Patient not taking: Reported on 08/19/2018) 120 tablet 1   No current facility-administered medications for this visit.    Facility-Administered Medications Ordered in Other Visits  Medication Dose Route Frequency Provider Last Rate Last Dose  . sodium chloride flush (NS) 0.9 % injection 10 mL  10 mL Intracatheter PRN Brunetta Genera, MD   10 mL at 08/10/18 1558    REVIEW OF SYSTEMS:    A 10+ POINT REVIEW OF SYSTEMS WAS OBTAINED including neurology, dermatology, psychiatry, cardiac,  respiratory, lymph, extremities, GI, GU, Musculoskeletal, constitutional, breasts, reproductive, HEENT.  All pertinent positives are noted in the HPI.  All others are negative.   PHYSICAL EXAMINATION: ECOG PERFORMANCE STATUS: 1 - Symptomatic but completely ambulatory  There were no vitals filed for this visit.   Wt Readings from Last 3 Encounters:  09/22/18 160 lb 1.6 oz (72.6 kg)  08/24/18 162 lb (73.5 kg)  08/19/18 164 lb 12.8 oz (74.8 kg)   There is no height or weight on file to calculate BMI.   GENERAL:alert, in no acute distress and comfortable SKIN: no acute rashes, no significant lesions EYES: conjunctiva are pink and non-injected, sclera anicteric OROPHARYNX: MMM, no exudates, no oropharyngeal erythema or ulceration NECK: supple, no JVD LYMPH:  no palpable lymphadenopathy in the cervical, axillary or inguinal regions LUNGS: clear to auscultation b/l with normal respiratory effort HEART: regular rate & rhythm ABDOMEN:  normoactive bowel sounds , non tender, not distended. No palpable hepatosplenomegaly.  Extremity: no pedal edema PSYCH: alert & oriented x 3 with fluent speech NEURO: no focal motor/sensory deficits   LABORATORY DATA:  I have reviewed the data as listed  . CBC Latest Ref Rng & Units 10/20/2018 10/05/2018 09/22/2018  WBC 4.0 - 10.5 K/uL 5.6 6.4 5.6  Hemoglobin 12.0 - 15.0 g/dL 11.3(L) 10.9(L) 11.6(L)  Hematocrit 36.0 - 46.0 % 34.2(L) 32.5(L) 35.8(L)  Platelets 150 - 400 K/uL 211 198 220    . CMP Latest Ref Rng & Units 10/20/2018 10/05/2018 09/22/2018  Glucose 70 - 99 mg/dL 101(H) 90 118(H)  BUN 8 - 23 mg/dL 21 38(H) 23  Creatinine 0.44 - 1.00 mg/dL 0.89 1.08(H) 0.85  Sodium 135 - 145 mmol/L 141 141 141  Potassium 3.5 - 5.1 mmol/L 4.1 3.9 3.7  Chloride 98 - 111 mmol/L 107 107 108  CO2 22 - 32 mmol/L '23 24 23  '$ Calcium  8.9 - 10.3 mg/dL 9.7 9.5 9.0  Total Protein 6.5 - 8.1 g/dL 7.0 6.9 6.9  Total Bilirubin 0.3 - 1.2 mg/dL 0.3 0.3 0.3  Alkaline Phos 38 -  126 U/L 62 58 66  AST 15 - 41 U/L '16 17 15  '$ ALT 0 - 44 U/L '11 10 12    '$ 05/19/17 Fine Needle Aspiration:  05/19/17 Bronchial Washing Specimen B:   RADIOGRAPHIC STUDIES: I have personally reviewed the radiological images as listed and agreed with the findings in the report. No results found. MRI brain 04/21/2017: IMPRESSION: 1. Motion degraded examination. 2. 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 3. Otherwise negative MRI of the head with and without contrast for age.  Electronically Signed   By: Elon Alas M.D.   On: 04/22/2017 00:34    ASSESSMENT & PLAN:   63 y.o. female with  1. Recently diagnosed Non-Small Cell Carcinoma on bronchoscopy/cytology.  Not enough tissue for further characterization or mutation testing. Presenting with mediastinal nodal mass - TxN2 Mx (Atleast Stage III disease) ? Lingular primary vs inflammation. No overt evidence of metastatic disease.  -EGFR mutation studies on blood negative. Patient is s/p 3 cycles of Carboplatin/taxol -resolution left upper lobe ground-glass opacity has resolved in the interval. 08/19/17 PET revealed  No significant change in size of hypermetabolic AP window mass. The degree of FDG uptake is slightly increased in the interval. 2. Persistent hypermetabolic focus involving the distal aspect of the tongue within SUV max of 16.46. Similar to previous exam. (no clinical co-relate). Lung involvement has decreased.   08/19/17 Brain MRI revealed No evidence of intracranial metastases. Interval resolution of bilateral parietal signal abnormality favoring PRES  Completed concurrent Carbo/taxol chemotherapy + RT  12/24/17 PET/CT revealed Marked interval response to therapy with large AP window lymph nodes seen on the previous study almost imperceptible on today's exam. Hypermetabolism within this lesion has resolved with FDG accumulation now at background blood pool  levels. 2. Small focus of hypermetabolism in the anal region, similar to prior and indeterminate by PET imaging. 3. Hypermetabolic focus at the distal tongue has decreased in the Interval. 4. Marked pelvic floor laxity with cystocele. 5. Stable tiny left lung nodules. 6.  Aortic Atherosclerois. 7.  Emphysema.   04/03/17 endoscopy did not reveal esophageal thickening    04/27/18 CT Chest revealed Stable exam. No new or progressive findings. 2. Stable mild circumferential wall thickening mid esophagus. Esophagitis is a consideration. 3. Stable tiny left pulmonary nodules. 4.  Emphysema. 5.  Aortic Atherosclerois.  2. Bone Pain from neulasta - managed with Tylenol  3. Previous h/ot SZ -currently on Keppra. MRI brain - resolution of findings of PRES  4. H/o rt calf DVT - off anticoagulation due to massive GI bleeding. S/p IVC filter. No issues currently  5. S/p previous GI bleeding from Gastric ulcer --needing clipping and arterial embolization. On PPI BID Ulcers seen to be healed with recent April 2020 repeat endoscopy  6. Low Magnesium Mg 1.6 -On oral magnesium '200mg'$  BID  7. H/o ctx /steroid related thrush --now resolved.  8. Elevated TSH/Hypothyroidism -- ? Related to immunotherapy 07/13/18 Thyroid functions revealed TSH at 62.91 and Free T4 at 0.30 Began low dose 42mg Levothyroxine and provide endocrinology referral for further optimization. Recommend taking Levothyroxine 1.5 hours before to breakfast with only water and no other medications.  F/u with Dr. SKelton Pillarin Endocrinology   PLAN: -Discussed pt labwork today, 10/20/2018; TSH is pending, HGB has improved -The  pt has no prohibitive toxicities from continuing C19 durvalumab at this time.   -Will plan to finish 24 doses of Levothyroxine before repeating scans -Continue Levothyroxine 88 MCG -No labs or clinical symptoms suggestive of lung cancer progression at this time.    -Continue Durvalumab q2weeks with labs x 5 -Will need  port flush with each lab and treatment (needs to be added) -MD visit with every other treatment may cancel the MD visit on 8/26   All of the patients questions were answered with apparent satisfaction. The patient knows to call the clinic with any problems, questions or concerns.  The total time spent in the appt was 20 minutes and more than 50% was on counseling and direct patient cares.  Sullivan Lone MD Kayla AAHIVMS [facility] Hematology/Oncology Physician [facility]  (Office):       [phone] (Work cell):  [phone] (Fax):           [phone]  10/18/2018 10:16 PM   I, De Burrs, am acting as a scribe for Dr. Irene Limbo  .I have reviewed the above documentation for accuracy and completeness, and I agree with the above. Brunetta Genera MD

## 2018-10-20 ENCOUNTER — Telehealth: Payer: Self-pay | Admitting: Hematology

## 2018-10-20 ENCOUNTER — Inpatient Hospital Stay: Payer: PPO | Attending: Hematology

## 2018-10-20 ENCOUNTER — Inpatient Hospital Stay (HOSPITAL_BASED_OUTPATIENT_CLINIC_OR_DEPARTMENT_OTHER): Payer: PPO | Admitting: Hematology

## 2018-10-20 ENCOUNTER — Other Ambulatory Visit: Payer: Self-pay

## 2018-10-20 ENCOUNTER — Inpatient Hospital Stay: Payer: PPO

## 2018-10-20 VITALS — BP 123/76 | HR 103 | Temp 97.8°F | Resp 18 | Ht 66.0 in | Wt 161.1 lb

## 2018-10-20 VITALS — BP 113/51 | HR 78 | Resp 16

## 2018-10-20 DIAGNOSIS — Z5112 Encounter for antineoplastic immunotherapy: Secondary | ICD-10-CM

## 2018-10-20 DIAGNOSIS — Z452 Encounter for adjustment and management of vascular access device: Secondary | ICD-10-CM | POA: Insufficient documentation

## 2018-10-20 DIAGNOSIS — C383 Malignant neoplasm of mediastinum, part unspecified: Secondary | ICD-10-CM

## 2018-10-20 DIAGNOSIS — E032 Hypothyroidism due to medicaments and other exogenous substances: Secondary | ICD-10-CM

## 2018-10-20 DIAGNOSIS — Z79899 Other long term (current) drug therapy: Secondary | ICD-10-CM | POA: Diagnosis not present

## 2018-10-20 DIAGNOSIS — C3490 Malignant neoplasm of unspecified part of unspecified bronchus or lung: Secondary | ICD-10-CM | POA: Diagnosis not present

## 2018-10-20 DIAGNOSIS — Z5111 Encounter for antineoplastic chemotherapy: Secondary | ICD-10-CM

## 2018-10-20 LAB — CMP (CANCER CENTER ONLY)
ALT: 11 U/L (ref 0–44)
AST: 16 U/L (ref 15–41)
Albumin: 3.7 g/dL (ref 3.5–5.0)
Alkaline Phosphatase: 62 U/L (ref 38–126)
Anion gap: 11 (ref 5–15)
BUN: 21 mg/dL (ref 8–23)
CO2: 23 mmol/L (ref 22–32)
Calcium: 9.7 mg/dL (ref 8.9–10.3)
Chloride: 107 mmol/L (ref 98–111)
Creatinine: 0.89 mg/dL (ref 0.44–1.00)
GFR, Est AFR Am: >60 mL/min (ref >60)
GFR, Estimated: >60 mL/min (ref >60)
Glucose, Bld: 101 mg/dL — ABNORMAL HIGH (ref 70–99)
Potassium: 4.1 mmol/L (ref 3.5–5.1)
Sodium: 141 mmol/L (ref 135–145)
Total Bilirubin: 0.3 mg/dL (ref 0.3–1.2)
Total Protein: 7 g/dL (ref 6.5–8.1)

## 2018-10-20 LAB — CBC WITH DIFFERENTIAL/PLATELET
Abs Immature Granulocytes: 0.03 10*3/uL (ref 0.00–0.07)
Basophils Absolute: 0 10*3/uL (ref 0.0–0.1)
Basophils Relative: 1 %
Eosinophils Absolute: 0.2 10*3/uL (ref 0.0–0.5)
Eosinophils Relative: 4 %
HCT: 34.2 % — ABNORMAL LOW (ref 36.0–46.0)
Hemoglobin: 11.3 g/dL — ABNORMAL LOW (ref 12.0–15.0)
Immature Granulocytes: 1 %
Lymphocytes Relative: 20 %
Lymphs Abs: 1.1 10*3/uL (ref 0.7–4.0)
MCH: 32.4 pg (ref 26.0–34.0)
MCHC: 33 g/dL (ref 30.0–36.0)
MCV: 98 fL (ref 80.0–100.0)
Monocytes Absolute: 0.5 10*3/uL (ref 0.1–1.0)
Monocytes Relative: 8 %
Neutro Abs: 3.8 10*3/uL (ref 1.7–7.7)
Neutrophils Relative %: 66 %
Platelets: 211 10*3/uL (ref 150–400)
RBC: 3.49 MIL/uL — ABNORMAL LOW (ref 3.87–5.11)
RDW: 13.6 % (ref 11.5–15.5)
WBC: 5.6 10*3/uL (ref 4.0–10.5)
nRBC: 0 % (ref 0.0–0.2)

## 2018-10-20 LAB — TSH: TSH: 1.193 u[IU]/mL (ref 0.308–3.960)

## 2018-10-20 MED ORDER — HEPARIN SOD (PORK) LOCK FLUSH 100 UNIT/ML IV SOLN
500.0000 [IU] | Freq: Once | INTRAVENOUS | Status: AC | PRN
  Administered 2018-10-20: 500 [IU]
  Filled 2018-10-20: qty 5

## 2018-10-20 MED ORDER — SODIUM CHLORIDE 0.9 % IV SOLN
10.0000 mg/kg | Freq: Once | INTRAVENOUS | Status: AC
  Administered 2018-10-20: 740 mg via INTRAVENOUS
  Filled 2018-10-20: qty 10

## 2018-10-20 MED ORDER — SODIUM CHLORIDE 0.9% FLUSH
10.0000 mL | INTRAVENOUS | Status: DC | PRN
  Administered 2018-10-20: 10 mL
  Filled 2018-10-20: qty 10

## 2018-10-20 MED ORDER — SODIUM CHLORIDE 0.9 % IV SOLN
Freq: Once | INTRAVENOUS | Status: AC
  Administered 2018-10-20: 12:00:00 via INTRAVENOUS
  Filled 2018-10-20: qty 250

## 2018-10-20 NOTE — Patient Instructions (Signed)
[facility] Discharge Instructions for Patients Receiving Chemotherapy  Today you received the following chemotherapy agents: Imfinzi.  To help prevent nausea and vomiting after your treatment, we encourage you to take your nausea medication as directed.   If you develop nausea and vomiting that is not controlled by your nausea medication, call the clinic.   BELOW ARE SYMPTOMS THAT SHOULD BE REPORTED IMMEDIATELY:  *FEVER GREATER THAN 100.5 F  *CHILLS WITH OR WITHOUT FEVER  NAUSEA AND VOMITING THAT IS NOT CONTROLLED WITH YOUR NAUSEA MEDICATION  *UNUSUAL SHORTNESS OF BREATH  *UNUSUAL BRUISING OR BLEEDING  TENDERNESS IN MOUTH AND THROAT WITH OR WITHOUT PRESENCE OF ULCERS  *URINARY PROBLEMS  *BOWEL PROBLEMS  UNUSUAL RASH Items with * indicate a potential emergency and should be followed up as soon as possible.  Feel free to call the clinic should you have any questions or concerns. The clinic phone number is [phone].  Please show the CHEMO ALERT CARD at check-in to the Emergency Department and triage nurse.   

## 2018-10-20 NOTE — Telephone Encounter (Signed)
Scheduled appt per 8/12 los.

## 2018-10-21 ENCOUNTER — Other Ambulatory Visit (INDEPENDENT_AMBULATORY_CARE_PROVIDER_SITE_OTHER): Payer: PPO

## 2018-10-21 DIAGNOSIS — E032 Hypothyroidism due to medicaments and other exogenous substances: Secondary | ICD-10-CM | POA: Diagnosis not present

## 2018-10-21 LAB — T4, FREE: Free T4: 1.25 ng/dL (ref 0.60–1.60)

## 2018-10-21 LAB — TSH: TSH: 1.29 u[IU]/mL (ref 0.35–4.50)

## 2018-10-22 ENCOUNTER — Encounter: Payer: Self-pay | Admitting: Internal Medicine

## 2018-11-03 ENCOUNTER — Other Ambulatory Visit: Payer: PPO

## 2018-11-03 ENCOUNTER — Inpatient Hospital Stay: Payer: PPO

## 2018-11-03 ENCOUNTER — Other Ambulatory Visit: Payer: Self-pay | Admitting: Hematology

## 2018-11-03 ENCOUNTER — Ambulatory Visit: Payer: PPO | Admitting: Hematology

## 2018-11-03 ENCOUNTER — Other Ambulatory Visit: Payer: Self-pay

## 2018-11-03 VITALS — BP 100/54 | HR 82 | Temp 97.8°F | Resp 17 | Ht 66.0 in | Wt 159.5 lb

## 2018-11-03 DIAGNOSIS — C3490 Malignant neoplasm of unspecified part of unspecified bronchus or lung: Secondary | ICD-10-CM

## 2018-11-03 DIAGNOSIS — Z5112 Encounter for antineoplastic immunotherapy: Secondary | ICD-10-CM

## 2018-11-03 DIAGNOSIS — Z5111 Encounter for antineoplastic chemotherapy: Secondary | ICD-10-CM

## 2018-11-03 DIAGNOSIS — C383 Malignant neoplasm of mediastinum, part unspecified: Secondary | ICD-10-CM

## 2018-11-03 DIAGNOSIS — Z95828 Presence of other vascular implants and grafts: Secondary | ICD-10-CM

## 2018-11-03 DIAGNOSIS — E032 Hypothyroidism due to medicaments and other exogenous substances: Secondary | ICD-10-CM

## 2018-11-03 LAB — CBC WITH DIFFERENTIAL/PLATELET
Abs Immature Granulocytes: 0.02 10*3/uL (ref 0.00–0.07)
Basophils Absolute: 0 10*3/uL (ref 0.0–0.1)
Basophils Relative: 1 %
Eosinophils Absolute: 0.2 10*3/uL (ref 0.0–0.5)
Eosinophils Relative: 4 %
HCT: 34.5 % — ABNORMAL LOW (ref 36.0–46.0)
Hemoglobin: 11.5 g/dL — ABNORMAL LOW (ref 12.0–15.0)
Immature Granulocytes: 0 %
Lymphocytes Relative: 20 %
Lymphs Abs: 1.1 10*3/uL (ref 0.7–4.0)
MCH: 31.5 pg (ref 26.0–34.0)
MCHC: 33.3 g/dL (ref 30.0–36.0)
MCV: 94.5 fL (ref 80.0–100.0)
Monocytes Absolute: 0.5 10*3/uL (ref 0.1–1.0)
Monocytes Relative: 9 %
Neutro Abs: 3.7 10*3/uL (ref 1.7–7.7)
Neutrophils Relative %: 66 %
Platelets: 224 10*3/uL (ref 150–400)
RBC: 3.65 MIL/uL — ABNORMAL LOW (ref 3.87–5.11)
RDW: 13.2 % (ref 11.5–15.5)
WBC: 5.6 10*3/uL (ref 4.0–10.5)
nRBC: 0 % (ref 0.0–0.2)

## 2018-11-03 LAB — CMP (CANCER CENTER ONLY)
ALT: 11 U/L (ref 0–44)
AST: 16 U/L (ref 15–41)
Albumin: 3.5 g/dL (ref 3.5–5.0)
Alkaline Phosphatase: 69 U/L (ref 38–126)
Anion gap: 11 (ref 5–15)
BUN: 18 mg/dL (ref 8–23)
CO2: 23 mmol/L (ref 22–32)
Calcium: 9.3 mg/dL (ref 8.9–10.3)
Chloride: 108 mmol/L (ref 98–111)
Creatinine: 0.82 mg/dL (ref 0.44–1.00)
GFR, Est AFR Am: >60 mL/min (ref >60)
GFR, Estimated: >60 mL/min (ref >60)
Glucose, Bld: 94 mg/dL (ref 70–99)
Potassium: 3.6 mmol/L (ref 3.5–5.1)
Sodium: 142 mmol/L (ref 135–145)
Total Bilirubin: 0.2 mg/dL — ABNORMAL LOW (ref 0.3–1.2)
Total Protein: 6.7 g/dL (ref 6.5–8.1)

## 2018-11-03 LAB — T4, FREE: Free T4: 1.33 ng/dL — ABNORMAL HIGH (ref 0.61–1.12)

## 2018-11-03 MED ORDER — SODIUM CHLORIDE 0.9% FLUSH
10.0000 mL | INTRAVENOUS | Status: DC | PRN
  Administered 2018-11-03: 10 mL
  Filled 2018-11-03: qty 10

## 2018-11-03 MED ORDER — SODIUM CHLORIDE 0.9 % IV SOLN
10.0000 mg/kg | Freq: Once | INTRAVENOUS | Status: AC
  Administered 2018-11-03: 740 mg via INTRAVENOUS
  Filled 2018-11-03: qty 10

## 2018-11-03 MED ORDER — ALTEPLASE 2 MG IJ SOLR
INTRAMUSCULAR | Status: AC
  Filled 2018-11-03: qty 2

## 2018-11-03 MED ORDER — SODIUM CHLORIDE 0.9 % IV SOLN
Freq: Once | INTRAVENOUS | Status: AC
  Administered 2018-11-03: 11:00:00 via INTRAVENOUS
  Filled 2018-11-03: qty 250

## 2018-11-03 MED ORDER — ALTEPLASE 2 MG IJ SOLR
2.0000 mg | Freq: Once | INTRAMUSCULAR | Status: AC | PRN
  Administered 2018-11-03: 2 mg
  Filled 2018-11-03: qty 2

## 2018-11-03 MED ORDER — HEPARIN SOD (PORK) LOCK FLUSH 100 UNIT/ML IV SOLN
500.0000 [IU] | Freq: Once | INTRAVENOUS | Status: DC | PRN
  Filled 2018-11-03: qty 5

## 2018-11-03 NOTE — Progress Notes (Signed)
Unable to observe blood return while flushing pt's PAC before deaccessing after Imfinzi infusion was completed (had witnessed blood return before connecting pt's PAC to saline at the start of her treatment). Had pt do several minutes of "port aerobics" but still did not obtain any blood return. Called Dr. Irene Limbo with update and received orders to instill CathFlow and then deaccess pt's port, and have pt return for her lab/port flush as normal for her next treatment. Orders placed and pt updated on plan. CathFlow instilled at 12:23 (heparin not administered) and PAC deaccessed without further incident.

## 2018-11-03 NOTE — Patient Instructions (Signed)
Durvalumab injection What is this medicine? DURVALUMAB (dur VAL ue mab) is a monoclonal antibody. It is used to treat urothelial cancer and lung cancer. This medicine may be used for other purposes; ask your health care provider or pharmacist if you have questions. COMMON BRAND NAME(S): [email or web address] What should I tell my health care provider before I take this medicine? They need to know if you have any of these conditions:  diabetes  immune system problems  infection  inflammatory bowel disease  kidney disease  liver disease  lung or breathing disease  lupus  organ transplant  stomach or intestine problems  thyroid disease  an unusual or allergic reaction to durvalumab, other medicines, foods, dyes, or preservatives  pregnant or trying to get pregnant  breast-feeding How should I use this medicine? This medicine is for infusion into a vein. It is given by a health care professional in a hospital or clinic setting. A special MedGuide will be given to you before each treatment. Be sure to read this information carefully each time. Talk to your pediatrician regarding the use of this medicine in children. Special care may be needed. Overdosage: If you think you have taken too much of this medicine contact a poison control center or emergency room at once. NOTE: This medicine is only for you. Do not share this medicine with others. What if I miss a dose? It is important not to miss your dose. Call your doctor or health care professional if you are unable to keep an appointment. What may interact with this medicine? Interactions have not been studied. This list may not describe all possible interactions. Give your health care provider a list of all the medicines, herbs, non-prescription drugs, or dietary supplements you use. Also tell them if you smoke, drink alcohol, or use illegal drugs. Some items may interact with your medicine. What should I watch for while using this  medicine? This drug may make you feel generally unwell. Continue your course of treatment even though you feel ill unless your doctor tells you to stop. You may need blood work done while you are taking this medicine. Do not become pregnant while taking this medicine or for 3 months after stopping it. Women should inform their doctor if they wish to become pregnant or think they might be pregnant. There is a potential for serious side effects to an unborn child. Talk to your health care professional or pharmacist for more information. Do not breast-feed an infant while taking this medicine or for 3 months after stopping it. What side effects may I notice from receiving this medicine? Side effects that you should report to your doctor or health care professional as soon as possible:  allergic reactions like skin rash, itching or hives, swelling of the face, lips, or tongue  black, tarry stools  bloody or watery diarrhea  breathing problems  change in emotions or moods  change in sex drive  changes in vision  chest pain or chest tightness  chills  confusion  cough  facial flushing  fever  headache  signs and symptoms of high blood sugar such as dizziness; dry mouth; dry skin; fruity breath; nausea; stomach pain; increased hunger or thirst; increased urination  signs and symptoms of liver injury like dark yellow or brown urine; general ill feeling or flu-like symptoms; light-colored stools; loss of appetite; nausea; right upper belly pain; unusually weak or tired; yellowing of the eyes or skin  stomach pain  trouble passing urine or change in   the amount of urine  weight gain or weight loss Side effects that usually do not require medical attention (report these to your doctor or health care professional if they continue or are bothersome):  bone pain  constipation  loss of appetite  muscle pain  nausea  swelling of the ankles, feet, hands  tiredness This list  may not describe all possible side effects. Call your doctor for medical advice about side effects. You may report side effects to FDA at 1-800-FDA-1088. Where should I keep my medicine? This drug is given in a hospital or clinic and will not be stored at home. NOTE: This sheet is a summary. It may not cover all possible information. If you have questions about this medicine, talk to your doctor, pharmacist, or health care provider.  2020 [employer]/Gold Standard (2016-05-06 19:25:04)  Coronavirus (COVID-19) Are you at risk?  Are you at risk for the Coronavirus (COVID-19)?  To be considered HIGH RISK for Coronavirus (COVID-19), you have to meet the following criteria:  . Traveled to [place], [place], [place], [place] or [place]; or in the [place] to [place], [place], [place], or [place]; and have fever, cough, and shortness of breath within the last 2 weeks of travel OR . Been in close contact with a person diagnosed with COVID-19 within the last 2 weeks and have fever, cough, and shortness of breath . IF YOU DO NOT MEET THESE CRITERIA, YOU ARE CONSIDERED LOW RISK FOR COVID-19.  What to do if you are HIGH RISK for COVID-19?  . If you are having a medical emergency, call 911. . Seek medical care right away. Before you go to a doctor's office, urgent care or emergency department, call ahead and tell them about your recent travel, contact with someone diagnosed with COVID-19, and your symptoms. You should receive instructions from your physician's office regarding next steps of care.  . When you arrive at healthcare provider, tell the healthcare staff immediately you have returned from visiting [place], [place], [place], [place] or [place]; or traveled in the [place] to [place], [place], [place], or [place]; in the last two weeks or you have been in close contact with a person diagnosed with COVID-19 in the last 2 weeks.   . Tell the health care staff about your symptoms:  fever, cough and shortness of breath. . After you have been seen by a medical provider, you will be either: o Tested for (COVID-19) and discharged home on quarantine except to seek medical care if symptoms worsen, and asked to  - Stay home and avoid contact with others until you get your results (4-5 days)  - Avoid travel on public transportation if possible (such as bus, train, or airplane) or o Sent to the Emergency Department by EMS for evaluation, COVID-19 testing, and possible admission depending on your condition and test results.  What to do if you are LOW RISK for COVID-19?  Reduce your risk of any infection by using the same precautions used for avoiding the common cold or flu:  . Wash your hands often with soap and warm water for at least 20 seconds.  If soap and water are not readily available, use an alcohol-based hand sanitizer with at least 60% alcohol.  . If coughing or sneezing, cover your mouth and nose by coughing or sneezing into the elbow areas of your shirt or coat, into a tissue or into your sleeve (not your hands). . Avoid shaking hands with others   and consider head nods or verbal greetings only. . Avoid touching your eyes, nose, or mouth with unwashed hands.  . Avoid close contact with people who are sick. . Avoid places or events with large numbers of people in one location, like concerts or sporting events. . Carefully consider travel plans you have or are making. . If you are planning any travel outside or inside the [place], visit the CDC's Travelers' Health webpage for the latest health notices. . If you have some symptoms but not all symptoms, continue to monitor at home and seek medical attention if your symptoms worsen. . If you are having a medical emergency, call 911.   ADDITIONAL HEALTHCARE OPTIONS FOR PATIENTS  [facility] / e-Visit: [email or web address]         [facility]: [phone]  [facility]: [phone]                   [facility]: [phone]   

## 2018-11-03 NOTE — Patient Instructions (Signed)

## 2018-11-17 ENCOUNTER — Telehealth: Payer: Self-pay | Admitting: Hematology

## 2018-11-17 ENCOUNTER — Inpatient Hospital Stay (HOSPITAL_BASED_OUTPATIENT_CLINIC_OR_DEPARTMENT_OTHER): Payer: PPO | Admitting: Hematology

## 2018-11-17 ENCOUNTER — Other Ambulatory Visit: Payer: Self-pay

## 2018-11-17 ENCOUNTER — Inpatient Hospital Stay: Payer: PPO

## 2018-11-17 ENCOUNTER — Inpatient Hospital Stay: Payer: PPO | Attending: Hematology

## 2018-11-17 VITALS — BP 107/39 | HR 108 | Temp 98.0°F | Resp 20 | Ht 66.0 in | Wt 159.8 lb

## 2018-11-17 VITALS — HR 89

## 2018-11-17 DIAGNOSIS — C383 Malignant neoplasm of mediastinum, part unspecified: Secondary | ICD-10-CM | POA: Diagnosis not present

## 2018-11-17 DIAGNOSIS — Z95828 Presence of other vascular implants and grafts: Secondary | ICD-10-CM

## 2018-11-17 DIAGNOSIS — C3490 Malignant neoplasm of unspecified part of unspecified bronchus or lung: Secondary | ICD-10-CM

## 2018-11-17 DIAGNOSIS — Z5112 Encounter for antineoplastic immunotherapy: Secondary | ICD-10-CM | POA: Insufficient documentation

## 2018-11-17 DIAGNOSIS — Z79899 Other long term (current) drug therapy: Secondary | ICD-10-CM | POA: Diagnosis not present

## 2018-11-17 DIAGNOSIS — Z5111 Encounter for antineoplastic chemotherapy: Secondary | ICD-10-CM

## 2018-11-17 DIAGNOSIS — E032 Hypothyroidism due to medicaments and other exogenous substances: Secondary | ICD-10-CM

## 2018-11-17 LAB — CBC WITH DIFFERENTIAL/PLATELET
Abs Immature Granulocytes: 0.02 10*3/uL (ref 0.00–0.07)
Basophils Absolute: 0.1 10*3/uL (ref 0.0–0.1)
Basophils Relative: 1 %
Eosinophils Absolute: 0.2 10*3/uL (ref 0.0–0.5)
Eosinophils Relative: 3 %
HCT: 35 % — ABNORMAL LOW (ref 36.0–46.0)
Hemoglobin: 11.5 g/dL — ABNORMAL LOW (ref 12.0–15.0)
Immature Granulocytes: 0 %
Lymphocytes Relative: 15 %
Lymphs Abs: 1.2 10*3/uL (ref 0.7–4.0)
MCH: 31.6 pg (ref 26.0–34.0)
MCHC: 32.9 g/dL (ref 30.0–36.0)
MCV: 96.2 fL (ref 80.0–100.0)
Monocytes Absolute: 0.6 10*3/uL (ref 0.1–1.0)
Monocytes Relative: 8 %
Neutro Abs: 5.7 10*3/uL (ref 1.7–7.7)
Neutrophils Relative %: 73 %
Platelets: 221 10*3/uL (ref 150–400)
RBC: 3.64 MIL/uL — ABNORMAL LOW (ref 3.87–5.11)
RDW: 13.2 % (ref 11.5–15.5)
WBC: 7.7 10*3/uL (ref 4.0–10.5)
nRBC: 0 % (ref 0.0–0.2)

## 2018-11-17 LAB — TSH: TSH: 0.384 u[IU]/mL (ref 0.308–3.960)

## 2018-11-17 LAB — CMP (CANCER CENTER ONLY)
ALT: 10 U/L (ref 0–44)
AST: 16 U/L (ref 15–41)
Albumin: 3.6 g/dL (ref 3.5–5.0)
Alkaline Phosphatase: 69 U/L (ref 38–126)
Anion gap: 9 (ref 5–15)
BUN: 25 mg/dL — ABNORMAL HIGH (ref 8–23)
CO2: 23 mmol/L (ref 22–32)
Calcium: 9 mg/dL (ref 8.9–10.3)
Chloride: 109 mmol/L (ref 98–111)
Creatinine: 0.9 mg/dL (ref 0.44–1.00)
GFR, Est AFR Am: >60 mL/min (ref >60)
GFR, Estimated: >60 mL/min (ref >60)
Glucose, Bld: 109 mg/dL — ABNORMAL HIGH (ref 70–99)
Potassium: 3.5 mmol/L (ref 3.5–5.1)
Sodium: 141 mmol/L (ref 135–145)
Total Bilirubin: 0.4 mg/dL (ref 0.3–1.2)
Total Protein: 6.7 g/dL (ref 6.5–8.1)

## 2018-11-17 MED ORDER — HEPARIN SOD (PORK) LOCK FLUSH 100 UNIT/ML IV SOLN
500.0000 [IU] | Freq: Once | INTRAVENOUS | Status: AC | PRN
  Administered 2018-11-17: 500 [IU]
  Filled 2018-11-17: qty 5

## 2018-11-17 MED ORDER — SODIUM CHLORIDE 0.9 % IV SOLN
10.0000 mg/kg | Freq: Once | INTRAVENOUS | Status: AC
  Administered 2018-11-17: 740 mg via INTRAVENOUS
  Filled 2018-11-17: qty 4.8

## 2018-11-17 MED ORDER — SODIUM CHLORIDE 0.9 % IV SOLN
Freq: Once | INTRAVENOUS | Status: AC
  Administered 2018-11-17: 11:00:00 via INTRAVENOUS
  Filled 2018-11-17: qty 250

## 2018-11-17 MED ORDER — SODIUM CHLORIDE 0.9% FLUSH
10.0000 mL | INTRAVENOUS | Status: DC | PRN
  Administered 2018-11-17: 10 mL
  Filled 2018-11-17: qty 10

## 2018-11-17 NOTE — Patient Instructions (Signed)

## 2018-11-17 NOTE — Progress Notes (Signed)
[name]    HEMATOLOGY/ONCOLOGY CLINIC NOTE  Date of Service: 11/17/2018   Patient Care Team: [name], MD as PCP - General (Family Medicine)  CHIEF COMPLAINTS/PURPOSE OF CONSULTATION:  F/u for continued management of Non-Small Cell lung Carcinoma  HISTORY OF PRESENTING ILLNESS:   Kayla Wyatt is a wonderful 63 y.o. female who has been referred to [place] by Dr [name] for evaluation and management of Non-Small Cell Carcinoma. She is accompanied today by her husband. The pt reports that she is doing well overall.   Patient recently had a significant hospitalization on 04/02/2017 - as per DC summary by [name] " with abdominal pain, hematemesis, melena and ABLA due to GIB.  She underwent clipping of gastric ulcer and received multiple units PRBC but continued to have drop in H/H with bloody stool. NM GI scan was negative for bleeding and she underwent colonoscopy by Dr. [name] 1/31 showing diverticulosis but no signs of bleeding. Hospital course significant for R- gastrocnemius DVT, PAF, incidental findings of mediastinal mass as well as brief episode of hematuria. She was cleared to start on Xarelto on 02/01 for treatment of DVT. On 2/2 am she developed hematemesis with maroon stools and hypotension requiring fluid bolus as well as 2 units PRBCs. She continued to decline requiring intubation, pressors as well as reversal of anticoagulation with Kcentra on 02/3. She underwent UGI revealing large clot with fresh blood in stomach and underwent mesenteric arteriogram with percutaneous coil embolization fo distal tributary of left gastric artery and placement of IVC filter by interventional radiology.   A fib with RVR felt to be due to hemorrhagic shock and she converted to NSR. [name] felt no further cardiac work up indicated and did not recommend initiating anticoagulation in short term. To follow up with [name] after PET scan and routine biopsy after medically stable. She  continued to have bleeding and underwent visceral angiogram with embolization of inferior division of splenic artery and associated gastric arteries on 2/4. No surgical intervention needed per [name] with recommendations to continue monitoring H/H as well as for recurrence of hematochezia. She tolerated extubation on 2/6 and respiratory status improving.  On 2/11, she has episode of unresponsiveness with jerking of bilateral limbs that lasted about 3 minutes. She had amnesia of events but was back to baseline. EEG revealed "focal slowing over the right temporo-occipital regionandoccasional epileptiform discharges over the right occipital region". Head CT reviewed, unremarkable for acute intracranial process. MRI brain done revealing mild biparietal signal abnormality suspicious for PRES. [name] recommended starting patient on Keppra BID with repeat MRI in 2 weeks. New onset seizure likely provoked by PRES--if resolved--Ok to take patient off Keppra. Patient with resultant generalized weakness. CIR recommended due to functional deficits. "  Later as outpatient she had a PET scan on 05/14/17 which showed a hypermetabolic mediastinal mass which was subsequently biopsied with a bronchoscopy on 05/19/17. She notes that she had smoked for about 35-40 years, half a pack of a day. She reports that she has not had any other symptoms related to her smoking; she has never had to be on home oxygen or inhalers. She has recently begun Symbicort. While being in the hospital she lost about 20 lbs but has maintained her weight since being discharged.  The pt and her husband note that she is concerned about what to do if she is not able to go back to work after her 90-day leave. We recommended that they speak with our  social workers here to explore the available options for insurance coverage Scientist, water quality.  Of note prior to the patient's visit today, pt has had PET/CT completed on 05/14/17 with  results revealing 1. The known mediastinal mass is intensely hypermetabolic, worrisome for malignancy. This could reflect lymphoma or small cell lung cancer. Tissue sampling recommended.2. No peripheral lung mass identified. New patchy ground-glass opacity in the lingula is mildly hypermetabolic and likely inflammatory.  On 05/19/17 the pt had a Fine Needle Aspiration which revealed Non-Small cell lung carcinoma.  Most recent lab results (05/19/17) of CBC  is as follows: all values are WNL except for RBC at 2.90, Hgb at 8.6, HCT at 28.0, RDW at 16.4.  On review of systems, pt reports knee pain, and denies abdominal pains, leg swelling, SOB, CP, and any other symptoms.   On PMHx the pt reports acute blood loss anemia, GI hemorrhage with melena, hypercholesteremia, HTN, persistent atrial fibrillation with rapid ventricular response, Vitamin D deficiency, denies liver and kidney problems. On Social Hx the pt reports having smoked for 35-40 years of half a pack per day. She quit smoking on 03/31/17.  On Family Hx the pt reports that her father had heart disease.   Interval History:  Kayla Wyatt returns today regarding her Non-Small Cell Carcinoma. The patient's last visit with [place] was on 10/20/2018. The pt reports that she is doing well overall.  The pt reports that she has no new concerns and that she has been taking her Synthroid regularly. She also affirms that she has had a recent T4 test that revealed normal TSH levels.   Lab results today (11/17/18) of CBC w/diff and CMP is as follows: all values are WNL except for RBC at 3.64, Hgb at 11.5, HCT at 35.0, Glucose at 109. BUN at 25.  11/03/2018 T4 at 0.384  On review of systems, pt denies SOB, bloody/black stools, rashes, headaches, changes in vision, changes in bowel habits, abdominal pain and any other symptoms.     MEDICAL HISTORY:  Past Medical History:  Diagnosis Date  . Acute blood loss anemia 04/02/2017  . Anxiety   .  Gastrointestinal hemorrhage with melena   . Headache   . Hypercholesteremia   . Hypertension   . Persistent atrial fibrillation with rapid ventricular response 04/07/2017  . Seizures ([place])    04/2017 while in hospital at [facility]  . Vitamin D deficiency     SURGICAL HISTORY: Past Surgical History:  Procedure Laterality Date  . COLONOSCOPY Left 04/09/2017   Procedure: COLONOSCOPY;  Surgeon: Carol Ada, MD;  Location: [place];  Service: Endoscopy;  Laterality: Left;  . ESOPHAGOGASTRODUODENOSCOPY N/A 04/03/2017   Procedure: ESOPHAGOGASTRODUODENOSCOPY (EGD);  Surgeon: Carol Ada, MD;  Location: [place];  Service: Endoscopy;  Laterality: N/A;  . ESOPHAGOGASTRODUODENOSCOPY (EGD) WITH PROPOFOL N/A 04/12/2017   Procedure: ESOPHAGOGASTRODUODENOSCOPY (EGD) WITH PROPOFOL;  Surgeon: Milus Banister, MD;  Location: [facility] ENDOSCOPY;  Service: Endoscopy;  Laterality: N/A;  . IR ANGIOGRAM SELECTIVE EACH ADDITIONAL VESSEL  04/12/2017  . IR ANGIOGRAM SELECTIVE EACH ADDITIONAL VESSEL  04/13/2017  . IR ANGIOGRAM SELECTIVE EACH ADDITIONAL VESSEL  04/13/2017  . IR ANGIOGRAM SELECTIVE EACH ADDITIONAL VESSEL  04/13/2017  . IR ANGIOGRAM VISCERAL SELECTIVE  04/12/2017  . IR ANGIOGRAM VISCERAL SELECTIVE  04/13/2017  . IR EMBO ART  VEN HEMORR LYMPH EXTRAV  INC GUIDE ROADMAPPING  04/12/2017  . IR EMBO ART  VEN HEMORR LYMPH EXTRAV  INC GUIDE ROADMAPPING  04/13/2017  . IR FLUORO GUIDE  CV LINE RIGHT  04/13/2017  . IR FLUORO GUIDE PORT INSERTION RIGHT  07/27/2017  . IR IVC FILTER PLMT / S&I /IMG GUID/MOD SED  04/12/2017  . IR US GUIDE VASC ACCESS RIGHT  04/12/2017  . IR US GUIDE VASC ACCESS RIGHT  04/13/2017  . IR US GUIDE VASC ACCESS RIGHT  04/13/2017  . IR US GUIDE VASC ACCESS RIGHT  07/27/2017  . LEG SURGERY  1974   Blood Clot Removal   . VIDEO BRONCHOSCOPY WITH ENDOBRONCHIAL ULTRASOUND N/A 05/19/2017   Procedure: VIDEO BRONCHOSCOPY WITH ENDOBRONCHIAL ULTRASOUND;  Surgeon: Grace Isaac, MD;  Location: [facility];  Service: Thoracic;   Laterality: N/A;    SOCIAL HISTORY: Social History   Socioeconomic History  . Marital status: Single    Spouse name: Not on file  . Number of children: Not on file  . Years of education: Not on file  . Highest education level: Not on file  Occupational History  . Occupation: Has to lift heavy boxes at times.    Employer: [employer]  Social Needs  . Financial resource strain: Not on file  . Food insecurity    Worry: Not on file    Inability: Not on file  . Transportation needs    Medical: Not on file    Non-medical: Not on file  Tobacco Use  . Smoking status: Former Smoker    Packs/day: 0.50    Years: 40.00    Pack years: 20.00    Types: Cigarettes    Quit date: 03/31/2017    Years since quitting: 1.6  . Smokeless tobacco: Never Used  Substance and Sexual Activity  . Alcohol use: No    Frequency: Never  . Drug use: No  . Sexual activity: Not Currently  Lifestyle  . Physical activity    Days per week: Not on file    Minutes per session: Not on file  . Stress: Not on file  Relationships  . Social Herbalist on phone: Not on file    Gets together: Not on file    Attends religious service: Not on file    Active member of club or organization: Not on file    Attends meetings of clubs or organizations: Not on file    Relationship status: Not on file  . Intimate partner violence    Fear of current or ex partner: No    Emotionally abused: No    Physically abused: No    Forced sexual activity: No  Other Topics Concern  . Not on file  Social History Narrative  . Not on file    FAMILY HISTORY: Family History  Problem Relation Age of Onset  . Heart disease Father     ALLERGIES:  has No Known Allergies.  MEDICATIONS:  Current Outpatient Medications  Medication Sig Dispense Refill  . acetaminophen (TYLENOL) 325 MG tablet Take 1-2 tablets (325-650 mg total) by mouth every 4 (four) hours as needed for mild pain.    . budesonide-formoterol (SYMBICORT)  80-4.5 MCG/ACT inhaler Inhale 2 puffs into the lungs 2 (two) times daily. 1 Inhaler 12  . cholecalciferol (VITAMIN D) 1000 units tablet Take 1,000 Units by mouth 2 (two) times daily.    [name] levETIRAcetam (KEPPRA) 500 MG tablet TK 1 T PO BID  6  . levothyroxine (SYNTHROID) 88 MCG tablet Take 1 tablet (88 mcg total) by mouth daily. 60 tablet 3  . lidocaine-prilocaine (EMLA) cream Apply to affected area once 30 g 3  .  lisinopril-hydrochlorothiazide (PRINZIDE,ZESTORETIC) 20-12.5 MG tablet Take 1 tablet by mouth daily.    [name] LORazepam (ATIVAN) 0.5 MG tablet Take 2 tablets (1 mg total) by mouth once as needed for up to 1 dose (prior to PET/CT and MRI brain for anxiety). 6 tablet 0  . ondansetron (ZOFRAN) 8 MG tablet Take 1 tablet (8 mg total) by mouth 2 (two) times daily as needed for refractory nausea / vomiting. Start on day 3 after chemo. 30 tablet 1  . pantoprazole (PROTONIX) 40 MG tablet TK 1 T PO BID  6  . polycarbophil (FIBERCON) 625 MG tablet Take 2 tablets (1,250 mg total) by mouth 2 (two) times daily. 120 tablet 0  . potassium chloride SA (K-DUR,KLOR-CON) 20 MEQ tablet Take 1 tablet (20 mEq total) by mouth 2 (two) times daily. 60 tablet 0  . saccharomyces boulardii (FLORASTOR) 250 MG capsule Take 250 mg by mouth 2 (two) times daily.    . sucralfate (CARAFATE) 1 g tablet Crush and mix in 10-15 mL of water prior to swallowing qAC and HS (Patient not taking: Reported on 08/19/2018) 120 tablet 1   No current facility-administered medications for this visit.    Facility-Administered Medications Ordered in Other Visits  Medication Dose Route Frequency Provider Last Rate Last Dose  . sodium chloride flush (NS) 0.9 % injection 10 mL  10 mL Intracatheter PRN Brunetta Genera, MD   10 mL at 08/10/18 1558    REVIEW OF SYSTEMS:    A 10+ POINT REVIEW OF SYSTEMS WAS OBTAINED including neurology, dermatology, psychiatry, cardiac, respiratory, lymph, extremities, GI, GU, Musculoskeletal, constitutional,  breasts, reproductive, HEENT.  All pertinent positives are noted in the HPI.  All others are negative.    PHYSICAL EXAMINATION: ECOG PERFORMANCE STATUS: 1 - Symptomatic but completely ambulatory  There were no vitals filed for this visit.   Wt Readings from Last 3 Encounters:  11/03/18 159 lb 8 oz (72.3 kg)  10/20/18 161 lb 1.6 oz (73.1 kg)  09/22/18 160 lb 1.6 oz (72.6 kg)   Body mass index is 25.79 kg/m.    GENERAL:alert, in no acute distress and comfortable SKIN: no acute rashes, no significant lesions EYES: conjunctiva are pink and non-injected, sclera anicteric OROPHARYNX: MMM, no exudates, no oropharyngeal erythema or ulceration NECK: supple, no JVD LYMPH:  no palpable lymphadenopathy in the cervical, axillary or inguinal regions LUNGS: clear to auscultation b/l with normal respiratory effort HEART: regular rate & rhythm ABDOMEN:  normoactive bowel sounds , non tender, not distended. No palpable hepatosplenomegaly.  Extremity: no pedal edema PSYCH: alert & oriented x 3 with fluent speech NEURO: no focal motor/sensory deficits  LABORATORY DATA:  I have reviewed the data as listed  . CBC Latest Ref Rng & Units 11/17/2018 11/03/2018 10/20/2018  WBC 4.0 - 10.5 K/uL 7.7 5.6 5.6  Hemoglobin 12.0 - 15.0 g/dL 11.5(L) 11.5(L) 11.3(L)  Hematocrit 36.0 - 46.0 % 35.0(L) 34.5(L) 34.2(L)  Platelets 150 - 400 K/uL 221 224 211    . CMP Latest Ref Rng & Units 11/17/2018 11/03/2018 10/20/2018  Glucose 70 - 99 mg/dL 109(H) 94 101(H)  BUN 8 - 23 mg/dL 25(H) 18 21  Creatinine 0.44 - 1.00 mg/dL 0.90 0.82 0.89  Sodium 135 - 145 mmol/L 141 142 141  Potassium 3.5 - 5.1 mmol/L 3.5 3.6 4.1  Chloride 98 - 111 mmol/L 109 108 107  CO2 22 - 32 mmol/L _0 Calcium 8.9 - 10.3 mg/dL 9.0 9.3 9.7  Total Protein 6.5 -  8.1 g/dL 6.7 6.7 7.0  Total Bilirubin 0.3 - 1.2 mg/dL 0.4 0.2(L) 0.3  Alkaline Phos 38 - 126 U/L 69 69 62  AST 15 - 41 U/L _0 ALT 0 - 44 U/L _1 05/19/17 Fine  Needle Aspiration:  05/19/17 Bronchial Washing Specimen B:   RADIOGRAPHIC STUDIES: I have personally reviewed the radiological images as listed and agreed with the findings in the report. No results found. MRI brain 04/21/2017: IMPRESSION: 1. Motion degraded examination. 2. 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 3. Otherwise negative MRI of the head with and without contrast for age.  Electronically Signed   By: Elon Alas M.D.   On: 04/22/2017 00:34    ASSESSMENT & PLAN:   63 y.o. female with  1. Recently diagnosed Non-Small Cell Carcinoma on bronchoscopy/cytology.  Not enough tissue for further characterization or mutation testing. Presenting with mediastinal nodal mass - TxN2 Mx (Atleast Stage III disease) ? Lingular primary vs inflammation. No overt evidence of metastatic disease.  -EGFR mutation studies on blood negative. Patient is s/p 3 cycles of Carboplatin/taxol -resolution left upper lobe ground-glass opacity has resolved in the interval. 08/19/17 PET revealed  No significant change in size of hypermetabolic AP window mass. The degree of FDG uptake is slightly increased in the interval. 2. Persistent hypermetabolic focus involving the distal aspect of the tongue within SUV max of 16.46. Similar to previous exam. (no clinical co-relate). Lung involvement has decreased.   08/19/17 Brain MRI revealed No evidence of intracranial metastases. Interval resolution of bilateral parietal signal abnormality favoring PRES  Completed concurrent Carbo/taxol chemotherapy + RT  12/24/17 PET/CT revealed Marked interval response to therapy with large AP window lymph nodes seen on the previous study almost imperceptible on today's exam. Hypermetabolism within this lesion has resolved with FDG accumulation now at background blood pool levels. 2. Small focus of hypermetabolism in the anal region, similar to prior and  indeterminate by PET imaging. 3. Hypermetabolic focus at the distal tongue has decreased in the Interval. 4. Marked pelvic floor laxity with cystocele. 5. Stable tiny left lung nodules. 6.  Aortic Atherosclerois. 7.  Emphysema.   04/03/17 endoscopy did not reveal esophageal thickening    04/27/18 CT Chest revealed Stable exam. No new or progressive findings. 2. Stable mild circumferential wall thickening mid esophagus. Esophagitis is a consideration. 3. Stable tiny left pulmonary nodules. 4.  Emphysema. 5.  Aortic Atherosclerois.  2. Bone Pain from neulasta - managed with Tylenol  3. Previous h/ot SZ -currently on Keppra. MRI brain - resolution of findings of PRES  4. H/o rt calf DVT - off anticoagulation due to massive GI bleeding. S/p IVC filter. No issues currently  5. S/p previous GI bleeding from Gastric ulcer --needing clipping and arterial embolization. On PPI BID Ulcers seen to be healed with recent April 2020 repeat endoscopy  6. Low Magnesium Mg 1.6 -On oral magnesium 275m BID  7. H/o ctx /steroid related thrush --now resolved.  8. Elevated TSH/Hypothyroidism -- ? Related to immunotherapy 07/13/18 Thyroid functions revealed TSH at 62.91 and Free T4 at 0.30 Began low dose 584m Levothyroxine and provide endocrinology referral for further optimization. Recommend taking Levothyroxine 1.5 hours before to breakfast with only water and no other medications.  F/u with Dr. ShKelton Pillarn Endocrinology   PLAN:  -Discussed pt labwork today, 11/17/18; all values are WNL except for RBC at 3.64, Hgb at 11.5, HCT at 35.0, Glucose at 109.  BUN at 25.  -Discussed 11/03/2018 T4 at 0.384 -The pt has no prohibitive toxicities from continuing C21 Durvalumab at this time.  -Continue Levothyroxine 88 MCG -No labs or clinical symptoms suggestive of lung cancer progression at this time.  -Will repeat CT Chest/Abd/Pel after C24  FOLLOW UP: F/u for next 2 treatments as scheduled.   The total time  spent in the appt was 15 minutes and more than 50% was on counseling and direct patient cares.  All of the patient's questions were answered with apparent satisfaction. The patient knows to call the clinic with any problems, questions or concerns.   Sullivan Lone MD [place] AAHIVMS [facility] Hematology/Oncology Physician [facility]  (Office):       [phone] (Work cell):  [phone] (Fax):           [phone]  11/17/2018 3:21 AM  I, [name], am acting as a scribe for Dr. Sullivan Lone.   .I have reviewed the above documentation for accuracy and completeness, and I agree with the above. Brunetta Genera MD

## 2018-11-17 NOTE — Telephone Encounter (Signed)
Per 9/9 los F/u for next 2 treatments as scheduled

## 2018-11-17 NOTE — Patient Instructions (Signed)
Durvalumab injection What is this medicine? DURVALUMAB (dur VAL ue mab) is a monoclonal antibody. It is used to treat urothelial cancer and lung cancer. This medicine may be used for other purposes; ask your health care provider or pharmacist if you have questions. COMMON BRAND NAME(S): IMFINZI What should I tell my health care provider before I take this medicine? They need to know if you have any of these conditions:  diabetes  immune system problems  infection  inflammatory bowel disease  kidney disease  liver disease  lung or breathing disease  lupus  organ transplant  stomach or intestine problems  thyroid disease  an unusual or allergic reaction to durvalumab, other medicines, foods, dyes, or preservatives  pregnant or trying to get pregnant  breast-feeding How should I use this medicine? This medicine is for infusion into a vein. It is given by a health care professional in a hospital or clinic setting. A special MedGuide will be given to you before each treatment. Be sure to read this information carefully each time. Talk to your pediatrician regarding the use of this medicine in children. Special care may be needed. Overdosage: If you think you have taken too much of this medicine contact a poison control center or emergency room at once. NOTE: This medicine is only for you. Do not share this medicine with others. What if I miss a dose? It is important not to miss your dose. Call your doctor or health care professional if you are unable to keep an appointment. What may interact with this medicine? Interactions have not been studied. This list may not describe all possible interactions. Give your health care provider a list of all the medicines, herbs, non-prescription drugs, or dietary supplements you use. Also tell them if you smoke, drink alcohol, or use illegal drugs. Some items may interact with your medicine. What should I watch for while using this  medicine? This drug may make you feel generally unwell. Continue your course of treatment even though you feel ill unless your doctor tells you to stop. You may need blood work done while you are taking this medicine. Do not become pregnant while taking this medicine or for 3 months after stopping it. Women should inform their doctor if they wish to become pregnant or think they might be pregnant. There is a potential for serious side effects to an unborn child. Talk to your health care professional or pharmacist for more information. Do not breast-feed an infant while taking this medicine or for 3 months after stopping it. What side effects may I notice from receiving this medicine? Side effects that you should report to your doctor or health care professional as soon as possible:  allergic reactions like skin rash, itching or hives, swelling of the face, lips, or tongue  black, tarry stools  bloody or watery diarrhea  breathing problems  change in emotions or moods  change in sex drive  changes in vision  chest pain or chest tightness  chills  confusion  cough  facial flushing  fever  headache  signs and symptoms of high blood sugar such as dizziness; dry mouth; dry skin; fruity breath; nausea; stomach pain; increased hunger or thirst; increased urination  signs and symptoms of liver injury like dark yellow or brown urine; general ill feeling or flu-like symptoms; light-colored stools; loss of appetite; nausea; right upper belly pain; unusually weak or tired; yellowing of the eyes or skin  stomach pain  trouble passing urine or change in   the amount of urine  weight gain or weight loss Side effects that usually do not require medical attention (report these to your doctor or health care professional if they continue or are bothersome):  bone pain  constipation  loss of appetite  muscle pain  nausea  swelling of the ankles, feet, hands  tiredness This list  may not describe all possible side effects. Call your doctor for medical advice about side effects. You may report side effects to FDA at 1-800-FDA-1088. Where should I keep my medicine? This drug is given in a hospital or clinic and will not be stored at home. NOTE: This sheet is a summary. It may not cover all possible information. If you have questions about this medicine, talk to your doctor, pharmacist, or health care provider.  2020 Elsevier/Gold Standard (2016-05-06 19:25:04)  Coronavirus (COVID-19) Are you at risk?  Are you at risk for the Coronavirus (COVID-19)?  To be considered HIGH RISK for Coronavirus (COVID-19), you have to meet the following criteria:  . Traveled to [place], [place], [place], [place] or [place]; or in the [place] to [place], [place], [place], or [place]; and have fever, cough, and shortness of breath within the last 2 weeks of travel OR . Been in close contact with a person diagnosed with COVID-19 within the last 2 weeks and have fever, cough, and shortness of breath . IF YOU DO NOT MEET THESE CRITERIA, YOU ARE CONSIDERED LOW RISK FOR COVID-19.  What to do if you are HIGH RISK for COVID-19?  . If you are having a medical emergency, call 911. . Seek medical care right away. Before you go to a doctor's office, urgent care or emergency department, call ahead and tell them about your recent travel, contact with someone diagnosed with COVID-19, and your symptoms. You should receive instructions from your physician's office regarding next steps of care.  . When you arrive at healthcare provider, tell the healthcare staff immediately you have returned from visiting [place], [place], [place], [place] or [place]; or traveled in the [place] to [place], [place], [place], or [place]; in the last two weeks or you have been in close contact with a person diagnosed with COVID-19 in the last 2 weeks.   . Tell the health care staff about your symptoms:  fever, cough and shortness of breath. . After you have been seen by a medical provider, you will be either: o Tested for (COVID-19) and discharged home on quarantine except to seek medical care if symptoms worsen, and asked to  - Stay home and avoid contact with others until you get your results (4-5 days)  - Avoid travel on public transportation if possible (such as bus, train, or airplane) or o Sent to the Emergency Department by EMS for evaluation, COVID-19 testing, and possible admission depending on your condition and test results.  What to do if you are LOW RISK for COVID-19?  Reduce your risk of any infection by using the same precautions used for avoiding the common cold or flu:  . Wash your hands often with soap and warm water for at least 20 seconds.  If soap and water are not readily available, use an alcohol-based hand sanitizer with at least 60% alcohol.  . If coughing or sneezing, cover your mouth and nose by coughing or sneezing into the elbow areas of your shirt or coat, into a tissue or into your sleeve (not your hands). . Avoid shaking hands with others   and consider head nods or verbal greetings only. . Avoid touching your eyes, nose, or mouth with unwashed hands.  . Avoid close contact with people who are sick. . Avoid places or events with large numbers of people in one location, like concerts or sporting events. . Carefully consider travel plans you have or are making. . If you are planning any travel outside or inside the [place], visit the CDC's Travelers' Health webpage for the latest health notices. . If you have some symptoms but not all symptoms, continue to monitor at home and seek medical attention if your symptoms worsen. . If you are having a medical emergency, call 911.   ADDITIONAL HEALTHCARE OPTIONS FOR PATIENTS  [facility] / e-Visit: [email or web address]         [facility]: [phone]  [facility]: [phone]                   [facility]: [phone]   

## 2018-12-01 ENCOUNTER — Other Ambulatory Visit: Payer: Self-pay

## 2018-12-01 ENCOUNTER — Inpatient Hospital Stay (HOSPITAL_BASED_OUTPATIENT_CLINIC_OR_DEPARTMENT_OTHER): Payer: PPO | Admitting: Medical

## 2018-12-01 ENCOUNTER — Other Ambulatory Visit: Payer: Self-pay | Admitting: Medical

## 2018-12-01 ENCOUNTER — Inpatient Hospital Stay: Payer: PPO

## 2018-12-01 ENCOUNTER — Ambulatory Visit (HOSPITAL_COMMUNITY)
Admission: RE | Admit: 2018-12-01 | Discharge: 2018-12-01 | Disposition: A | Discharge status: Home / Self Care | Payer: PPO | Source: Ambulatory Visit | Attending: Medical | Admitting: Medical

## 2018-12-01 VITALS — BP 137/56 | HR 66 | Temp 98.3°F | Resp 18

## 2018-12-01 DIAGNOSIS — C383 Malignant neoplasm of mediastinum, part unspecified: Secondary | ICD-10-CM

## 2018-12-01 DIAGNOSIS — Z5112 Encounter for antineoplastic immunotherapy: Secondary | ICD-10-CM | POA: Diagnosis not present

## 2018-12-01 DIAGNOSIS — C3490 Malignant neoplasm of unspecified part of unspecified bronchus or lung: Secondary | ICD-10-CM

## 2018-12-01 DIAGNOSIS — E032 Hypothyroidism due to medicaments and other exogenous substances: Secondary | ICD-10-CM

## 2018-12-01 DIAGNOSIS — M7989 Other specified soft tissue disorders: Secondary | ICD-10-CM

## 2018-12-01 DIAGNOSIS — I82412 Acute embolism and thrombosis of left femoral vein: Secondary | ICD-10-CM

## 2018-12-01 DIAGNOSIS — Z95828 Presence of other vascular implants and grafts: Secondary | ICD-10-CM

## 2018-12-01 DIAGNOSIS — R600 Localized edema: Secondary | ICD-10-CM

## 2018-12-01 DIAGNOSIS — Z5111 Encounter for antineoplastic chemotherapy: Secondary | ICD-10-CM

## 2018-12-01 LAB — CBC WITH DIFFERENTIAL/PLATELET
Abs Immature Granulocytes: 0.02 10*3/uL (ref 0.00–0.07)
Basophils Absolute: 0 10*3/uL (ref 0.0–0.1)
Basophils Relative: 1 %
Eosinophils Absolute: 0.3 10*3/uL (ref 0.0–0.5)
Eosinophils Relative: 4 %
HCT: 32.8 % — ABNORMAL LOW (ref 36.0–46.0)
Hemoglobin: 10.8 g/dL — ABNORMAL LOW (ref 12.0–15.0)
Immature Granulocytes: 0 %
Lymphocytes Relative: 16 %
Lymphs Abs: 1.1 10*3/uL (ref 0.7–4.0)
MCH: 31.5 pg (ref 26.0–34.0)
MCHC: 32.9 g/dL (ref 30.0–36.0)
MCV: 95.6 fL (ref 80.0–100.0)
Monocytes Absolute: 0.6 10*3/uL (ref 0.1–1.0)
Monocytes Relative: 9 %
Neutro Abs: 4.5 10*3/uL (ref 1.7–7.7)
Neutrophils Relative %: 70 %
Platelets: 209 10*3/uL (ref 150–400)
RBC: 3.43 MIL/uL — ABNORMAL LOW (ref 3.87–5.11)
RDW: 13.4 % (ref 11.5–15.5)
WBC: 6.4 10*3/uL (ref 4.0–10.5)
nRBC: 0 % (ref 0.0–0.2)

## 2018-12-01 LAB — CMP (CANCER CENTER ONLY)
ALT: 31 U/L (ref 0–44)
AST: 28 U/L (ref 15–41)
Albumin: 3.6 g/dL (ref 3.5–5.0)
Alkaline Phosphatase: 79 U/L (ref 38–126)
Anion gap: 8 (ref 5–15)
BUN: 21 mg/dL (ref 8–23)
CO2: 23 mmol/L (ref 22–32)
Calcium: 9.3 mg/dL (ref 8.9–10.3)
Chloride: 110 mmol/L (ref 98–111)
Creatinine: 0.79 mg/dL (ref 0.44–1.00)
GFR, Est AFR Am: >60 mL/min (ref >60)
GFR, Estimated: >60 mL/min (ref >60)
Glucose, Bld: 105 mg/dL — ABNORMAL HIGH (ref 70–99)
Potassium: 3.4 mmol/L — ABNORMAL LOW (ref 3.5–5.1)
Sodium: 141 mmol/L (ref 135–145)
Total Bilirubin: 0.2 mg/dL — ABNORMAL LOW (ref 0.3–1.2)
Total Protein: 6.8 g/dL (ref 6.5–8.1)

## 2018-12-01 LAB — T4, FREE: Free T4: 1.39 ng/dL — ABNORMAL HIGH (ref 0.61–1.12)

## 2018-12-01 MED ORDER — HEPARIN SOD (PORK) LOCK FLUSH 100 UNIT/ML IV SOLN
500.0000 [IU] | Freq: Once | INTRAVENOUS | Status: AC | PRN
  Administered 2018-12-01: 500 [IU]
  Filled 2018-12-01: qty 5

## 2018-12-01 MED ORDER — SODIUM CHLORIDE 0.9% FLUSH
10.0000 mL | INTRAVENOUS | Status: DC | PRN
  Administered 2018-12-01: 10 mL
  Filled 2018-12-01: qty 10

## 2018-12-01 MED ORDER — ELIQUIS 5 MG VTE STARTER PACK: ORAL_TABLET | ORAL | 0 refills | Status: DC

## 2018-12-01 MED ORDER — SODIUM CHLORIDE 0.9 % IV SOLN
10.0000 mg/kg | Freq: Once | INTRAVENOUS | Status: AC
  Administered 2018-12-01: 10:00:00 740 mg via INTRAVENOUS
  Filled 2018-12-01: qty 4.8

## 2018-12-01 MED ORDER — SODIUM CHLORIDE 0.9 % IV SOLN
Freq: Once | INTRAVENOUS | Status: AC
  Administered 2018-12-01: 10:00:00 via INTRAVENOUS
  Filled 2018-12-01: qty 250

## 2018-12-01 NOTE — Patient Instructions (Signed)
[facility] Discharge Instructions for Patients Receiving Chemotherapy  Today you received the following chemotherapy agents: Durvalumab (Infimzi)  To help prevent nausea and vomiting after your treatment, we encourage you to take your nausea medication as directed.   If you develop nausea and vomiting that is not controlled by your nausea medication, call the clinic.   BELOW ARE SYMPTOMS THAT SHOULD BE REPORTED IMMEDIATELY:  *FEVER GREATER THAN 100.5 F  *CHILLS WITH OR WITHOUT FEVER  NAUSEA AND VOMITING THAT IS NOT CONTROLLED WITH YOUR NAUSEA MEDICATION  *UNUSUAL SHORTNESS OF BREATH  *UNUSUAL BRUISING OR BLEEDING  TENDERNESS IN MOUTH AND THROAT WITH OR WITHOUT PRESENCE OF ULCERS  *URINARY PROBLEMS  *BOWEL PROBLEMS  UNUSUAL RASH Items with * indicate a potential emergency and should be followed up as soon as possible.  Feel free to call the clinic should you have any questions or concerns. The clinic phone number is [phone].  Please show the [facility] at check-in to the Emergency Department and triage nurse.

## 2018-12-01 NOTE — Progress Notes (Signed)
Left lower extremity venous duplex has been completed. Preliminary results can be found in CV Proc through chart review.  Results were given to Sandi Mealy PA.  12/01/18 12:42 PM Carlos Levering RVT

## 2018-12-01 NOTE — Progress Notes (Signed)
Pt c/o intermittent left calf pain. Edema noted. Isabella Bowens PA-C evaluated patient.

## 2018-12-01 NOTE — Progress Notes (Signed)
[facility] Progress Note   Kayla Wyatt [id number] 1955-12-28 63 y.o.  Kayla Wyatt is managed by Dr. Sullivan Lone  Actively treated with chemotherapy/immunotherapy/hormonal therapy: yes  Current therapy: Imfinzi  Last treated: 12/01/2018 (cycle 22, day 1)  Next scheduled appointment with provider: 12/15/2018  Assessment: Plan:    Non-small cell lung cancer, unspecified laterality ([place])  Edema of left lower extremity  DVT of deep femoral vein, left (H[facility])   Non-small cell lung cancer: The patient received cycle 22, day 1 after Imfinzi today.  Left lower extremity edema with a vascular ultrasound returning showing an acute deep vein thrombosis involving the left femoral vein, left popliteal vein, left posterior tibial veins, left peroneal veins, left soleal veins, and left gastrocnemius veins: The patient was begun on Eliquis 5 mg p.o. twice daily x7 days and then reducing to 5 mg once daily.  Please see After Visit Summary for patient specific instructions.  Future Appointments  Date Time Provider [place]  12/15/2018  8:30 AM [facility] LAB 6 [facility] None  12/15/2018  8:45 AM [facility] [place] FLUSH [facility] None  12/15/2018  9:20 AM Brunetta Genera, MD [facility] None  12/15/2018 10:15 AM [facility] INFUSION [facility] None  12/23/2018 11:10 AM Shamleffer, Melanie Crazier, MD [facility] None  12/29/2018  9:00 AM [facility] ONLY [facility] None  12/29/2018  9:15 AM [facility] [place] FLUSH [facility] None  12/29/2018 10:00 AM [facility] INFUSION [facility] None    No orders of the defined types were placed in this encounter.      Subjective:   Patient ID:  Kayla Wyatt is a 63 y.o. (DOB 1955/08/19) female.  Chief Complaint: No chief complaint on file.   HPI MAKENA Wyatt  Is a 63 y.o. female with a diagnosis of non-small cell lung cancer.  She is receiving cycle 22, day 1) today and was seen in the infusion room when  she reported having pain in her left calf.  She reports that she has had swelling and pain in her left calf for several days.  She denies any change in activity or trauma.  She has a history of a DVT in her right lower extremity which was treated with Lovenox.  She denies short this of breath, chest pain, or chest discomfort.  Medications: I have reviewed the patient's current medications.  Allergies: No Known Allergies  Past Medical History:  Diagnosis Date  . Acute blood loss anemia 04/02/2017  . Anxiety   . Gastrointestinal hemorrhage with melena   . Headache   . Hypercholesteremia   . Hypertension   . Persistent atrial fibrillation with rapid ventricular response 04/07/2017  . Seizures ([place])    04/2017 while in hospital at [facility]  . Vitamin D deficiency     Past Surgical History:  Procedure Laterality Date  . COLONOSCOPY Left 04/09/2017   Procedure: COLONOSCOPY;  Surgeon: Carol Ada, MD;  Location: [facility];  Service: Endoscopy;  Laterality: Left;  . ESOPHAGOGASTRODUODENOSCOPY N/A 04/03/2017   Procedure: ESOPHAGOGASTRODUODENOSCOPY (EGD);  Surgeon: Carol Ada, MD;  Location: [facility];  Service: Endoscopy;  Laterality: N/A;  . ESOPHAGOGASTRODUODENOSCOPY (EGD) WITH PROPOFOL N/A 04/12/2017   Procedure: ESOPHAGOGASTRODUODENOSCOPY (EGD) WITH PROPOFOL;  Surgeon: Milus Banister, MD;  Location[facility] END[facility]COPY;  Service: Endoscopy;  Laterality: N/A;  . IR ANGIOGRAM SELECTIVE EACH ADDITIONAL VESSEL  04/12/2017  . IR ANGIOGRAM SELECTIVE EACH ADDITIONAL VESSEL  04/13/2017  . IR ANGIOGRAM SELECTIVE EACH ADDITIONAL VESSEL  04/13/2017  . [place]  ADDITIONAL VESSEL  04/13/2017  . IR ANGIOGRAM VISCERAL SELECTIVE  04/12/2017  . IR ANGIOGRAM VISCERAL SELECTIVE  04/13/2017  . IR EMBO ART  VEN HEMORR LYMPH EXTRAV  INC GUIDE ROADMAPPING  04/12/2017  . IR EMBO ART  VEN HEMORR LYMPH EXTRAV  INC GUIDE ROADMAPPING  04/13/2017  . IR FLUORO GUIDE CV LINE RIGHT  04/13/2017  . IR FLUORO GUIDE PORT  INSERTION RIGHT  07/27/2017  . IR IVC FILTER PLMT / S&I /IMG GUID/MOD SED  04/12/2017  . IR US GUIDE VASC ACCESS RIGHT  04/12/2017  . IR US GUIDE VASC ACCESS RIGHT  04/13/2017  . IR US GUIDE VASC ACCESS RIGHT  04/13/2017  . IR US GUIDE VASC ACCESS RIGHT  07/27/2017  . LEG SURGERY  1974   Blood Clot Removal   . VIDEO BRONCHOSCOPY WITH ENDOBRONCHIAL ULTRASOUND N/A 05/19/2017   Procedure: VIDEO BRONCHOSCOPY WITH ENDOBRONCHIAL ULTRASOUND;  Surgeon: Grace Isaac, MD;  [facility];  Service: Thoracic;  Laterality: N/A;    Family History  Problem Relation Age of Onset  . Heart disease Father     Social History   Socioeconomic History  . Marital status: Single    Spouse name: Not on file  . Number of children: Not on file  . Years of education: Not on file  . Highest education level: Not on file  Occupational History  . Occupation: Has to lift heavy boxes at times.    Employer: [employer]  Social Needs  . Financial resource strain: Not on file  . Food insecurity    Worry: Not on file    Inability: Not on file  . Transportation needs    Medical: Not on file    Non-medical: Not on file  Tobacco Use  . Smoking status: Former Smoker    Packs/day: 0.50    Years: 40.00    Pack years: 20.00    Types: Cigarettes    Quit date: 03/31/2017    Years since quitting: 1.6  . Smokeless tobacco: Never Used  Substance and Sexual Activity  . Alcohol use: No    Frequency: Never  . Drug use: No  . Sexual activity: Not Currently  Lifestyle  . Physical activity    Days per week: Not on file    Minutes per session: Not on file  . Stress: Not on file  Relationships  . Social Herbalist on phone: Not on file    Gets together: Not on file    Attends religious service: Not on file    Active member of club or organization: Not on file    Attends meetings of clubs or organizations: Not on file    Relationship status: Not on file  . Intimate partner violence    Fear of current or ex  partner: No    Emotionally abused: No    Physically abused: No    Forced sexual activity: No  Other Topics Concern  . Not on file  Social History Narrative  . Not on file    Past Medical History, Surgical history, Social history, and Family history were reviewed and updated as appropriate.   Please see review of systems for further details on the patient's review from today.   Review of Systems:  Review of Systems  Constitutional: Negative for chills, diaphoresis and fever.  HENT: Negative for trouble swallowing and voice change.   Respiratory: Negative for cough, chest tightness, shortness of breath and wheezing.   Cardiovascular: Positive  for leg swelling. Negative for chest pain and palpitations.  Gastrointestinal: Negative for abdominal pain, constipation, diarrhea, nausea and vomiting.  Musculoskeletal: Negative for back pain and myalgias.  Neurological: Negative for dizziness, light-headedness and headaches.    Objective:   Physical Exam:  There were no vitals taken for this visit. ECOG: 1  Physical Exam Constitutional:      General: She is not in acute distress.    Appearance: She is not diaphoretic.  HENT:     Head: Normocephalic and atraumatic.  Cardiovascular:     Rate and Rhythm: Normal rate and regular rhythm.     Heart sounds: Normal heart sounds. No murmur. No friction rub. No gallop.   Pulmonary:     Effort: Pulmonary effort is normal. No respiratory distress.     Breath sounds: Normal breath sounds. No wheezing or rales.  Musculoskeletal:     Left lower leg: Edema present.     Comments: The right calf measures 34 cm at 9 cm distal to the inferior pole of the patella. The left calf measures 35.5 cm at 9 cm distal to the inferior pole of the patella.  Skin:    General: Skin is warm and dry.     Findings: No erythema or rash.  Neurological:     Mental Status: She is alert.  Psychiatric:        Mood and Affect: Mood normal.        Behavior: Behavior  normal.        Thought Content: Thought content normal.        Judgment: Judgment normal.     Lab Review:     Component Value Date/Time   NA 141 12/01/2018 0841   K 3.4 (L) 12/01/2018 0841   CL 110 12/01/2018 0841   CO2 23 12/01/2018 0841   GLUCOSE 105 (H) 12/01/2018 0841   BUN 21 12/01/2018 0841   CREATININE 0.79 12/01/2018 0841   CALCIUM 9.3 12/01/2018 0841   PROT 6.8 12/01/2018 0841   ALBUMIN 3.6 12/01/2018 0841   AST 28 12/01/2018 0841   ALT 31 12/01/2018 0841   ALKPHOS 79 12/01/2018 0841   BILITOT 0.2 (L) 12/01/2018 0841   GFRNONAA >60 12/01/2018 0841   GFRAA >60 12/01/2018 0841       Component Value Date/Time   WBC 6.4 12/01/2018 0841   RBC 3.43 (L) 12/01/2018 0841   HGB 10.8 (L) 12/01/2018 0841   HGB 11.6 (L) 01/26/2018 0825   HCT 32.8 (L) 12/01/2018 0841   PLT 209 12/01/2018 0841   PLT 177 01/26/2018 0825   MCV 95.6 12/01/2018 0841   MCH 31.5 12/01/2018 0841   MCHC 32.9 12/01/2018 0841   RDW 13.4 12/01/2018 0841   LYMPHSABS 1.1 12/01/2018 0841   MONOABS 0.6 12/01/2018 0841   EOSABS 0.3 12/01/2018 0841   BASOSABS 0.0 12/01/2018 0841   -------------------------------  Imaging from last 24 hours (if applicable):  Radiology interpretation: Vas [place] Lower Extremity Venous (dvt)  Result Date: 12/01/2018  Lower Venous Study Indications: Swelling.  Risk Factors: Cancer. Comparison Study: 09/22/2017 - Positive for DVT. Performing Technologist: Oliver Hum RVT  Examination Guidelines: A complete evaluation includes B-mode imaging, spectral Doppler, color Doppler, and power Doppler as needed of all accessible portions of each vessel. Bilateral testing is considered an integral part of a complete examination. Limited examinations for reoccurring indications may be performed as noted.  +-----+---------------+---------+-----------+----------+--------------+ RIGHTCompressibilityPhasicitySpontaneityPropertiesThrombus Aging  +-----+---------------+---------+-----------+----------+--------------+ CFV  Full  Yes      Yes                                 +-----+---------------+---------+-----------+----------+--------------+   +---------+---------------+---------+-----------+----------+--------------+ LEFT     CompressibilityPhasicitySpontaneityPropertiesThrombus Aging +---------+---------------+---------+-----------+----------+--------------+ CFV      Full           Yes      Yes                                 +---------+---------------+---------+-----------+----------+--------------+ SFJ      Full                                                        +---------+---------------+---------+-----------+----------+--------------+ FV Prox  Partial        No       No                   Acute          +---------+---------------+---------+-----------+----------+--------------+ FV Mid   Partial        No       No                   Acute          +---------+---------------+---------+-----------+----------+--------------+ FV DistalNone           No       No                   Acute          +---------+---------------+---------+-----------+----------+--------------+ PFV      Full                                                        +---------+---------------+---------+-----------+----------+--------------+ POP      Partial        No       No                   Acute          +---------+---------------+---------+-----------+----------+--------------+ PTV      Partial                                      Acute          +---------+---------------+---------+-----------+----------+--------------+ PERO     Partial                                      Acute          +---------+---------------+---------+-----------+----------+--------------+ Soleal   None                                         Acute           +---------+---------------+---------+-----------+----------+--------------+ Gastroc  Partial  Acute          +---------+---------------+---------+-----------+----------+--------------+     Summary: Right: No evidence of common femoral vein obstruction. Left: Findings consistent with acute deep vein thrombosis involving the left femoral vein, left popliteal vein, left posterior tibial veins, left peroneal veins, left soleal veins, and left gastrocnemius veins. No cystic structure found in the popliteal fossa.  *See table(s) above for measurements and observations. Electronically signed by Harold Barban MD on 12/01/2018 at 2:14:48 PM.    Final         This case was discussed with Dr. Irene Limbo. He expresses agreement with my management of this patient.

## 2018-12-10 NOTE — Progress Notes (Signed)
[name]    HEMATOLOGY/ONCOLOGY CLINIC NOTE  Date of Service: 12/15/2018   Patient Care Team: [name], MD as PCP - General (Family Medicine)  CHIEF COMPLAINTS/PURPOSE OF CONSULTATION:   F/u for continued management of Non-Small Cell lung Carcinoma  HISTORY OF PRESENTING ILLNESS:   Kayla Wyatt is a wonderful 63 y.o. female who has been referred to [place] by Dr [name] for evaluation and management of Non-Small Cell Carcinoma. She is accompanied today by her husband. The pt reports that she is doing well overall.   Patient recently had a significant hospitalization on 04/02/2017 - as per DC summary by [name] " with abdominal pain, hematemesis, melena and ABLA due to GIB.  She underwent clipping of gastric ulcer and received multiple units PRBC but continued to have drop in H/H with bloody stool. NM GI scan was negative for bleeding and she underwent colonoscopy by Dr. [name] 1/31 showing diverticulosis but no signs of bleeding. Hospital course significant for R- gastrocnemius DVT, PAF, incidental findings of mediastinal mass as well as brief episode of hematuria. She was cleared to start on Xarelto on 02/01 for treatment of DVT. On 2/2 am she developed hematemesis with maroon stools and hypotension requiring fluid bolus as well as 2 units PRBCs. She continued to decline requiring intubation, pressors as well as reversal of anticoagulation with Kcentra on 02/3. She underwent UGI revealing large clot with fresh blood in stomach and underwent mesenteric arteriogram with percutaneous coil embolization fo distal tributary of left gastric artery and placement of IVC filter by interventional radiology.   A fib with RVR felt to be due to hemorrhagic shock and she converted to NSR. [name] felt no further cardiac work up indicated and did not recommend initiating anticoagulation in short term. To follow up with [name] after PET scan and routine biopsy after medically stable. She  continued to have bleeding and underwent visceral angiogram with embolization of inferior division of splenic artery and associated gastric arteries on 2/4. No surgical intervention needed per [name] with recommendations to continue monitoring H/H as well as for recurrence of hematochezia. She tolerated extubation on 2/6 and respiratory status improving.  On 2/11, she has episode of unresponsiveness with jerking of bilateral limbs that lasted about 3 minutes. She had amnesia of events but was back to baseline. EEG revealed "focal slowing over the right temporo-occipital regionandoccasional epileptiform discharges over the right occipital region". Head CT reviewed, unremarkable for acute intracranial process. MRI brain done revealing mild biparietal signal abnormality suspicious for PRES. [name] recommended starting patient on Keppra BID with repeat MRI in 2 weeks. New onset seizure likely provoked by PRES--if resolved--Ok to take patient off Keppra. Patient with resultant generalized weakness. CIR recommended due to functional deficits. "  Later as outpatient she had a PET scan on 05/14/17 which showed a hypermetabolic mediastinal mass which was subsequently biopsied with a bronchoscopy on 05/19/17. She notes that she had smoked for about 35-40 years, half a pack of a day. She reports that she has not had any other symptoms related to her smoking; she has never had to be on home oxygen or inhalers. She has recently begun Symbicort. While being in the hospital she lost about 20 lbs but has maintained her weight since being discharged.  The pt and her husband note that she is concerned about what to do if she is not able to go back to work after her 90-day leave. We recommended that they speak with  our social workers here to explore the available options for insurance coverage /disability application etc.  Of note prior to the patient's visit today, pt has had PET/CT completed on 05/14/17 with  results revealing 1. The known mediastinal mass is intensely hypermetabolic, worrisome for malignancy. This could reflect lymphoma or small cell lung cancer. Tissue sampling recommended.2. No peripheral lung mass identified. New patchy ground-glass opacity in the lingula is mildly hypermetabolic and likely inflammatory.  On 05/19/17 the pt had a Fine Needle Aspiration which revealed Non-Small cell lung carcinoma.  Most recent lab results (05/19/17) of CBC  is as follows: all values are WNL except for RBC at 2.90, Hgb at 8.6, HCT at 28.0, RDW at 16.4.  On review of systems, pt reports knee pain, and denies abdominal pains, leg swelling, SOB, CP, and any other symptoms.   On PMHx the pt reports acute blood loss anemia, GI hemorrhage with melena, hypercholesteremia, HTN, persistent atrial fibrillation with rapid ventricular response, Vitamin D deficiency, denies liver and kidney problems. On Social Hx the pt reports having smoked for 35-40 years of half a pack per day. She quit smoking on 03/31/17.  On Family Hx the pt reports that her father had heart disease.  Interval History:   Kayla Wyatt returns today regarding her Non-Small Cell Carcinoma. She is here for C23D1 of Durvalumab. The patient's last visit with [place] was on 11/17/2018. The pt reports that she is doing well overall.  The pt reports that her leg swelling and pain has gone away since she was placed on Eliquis due to the findings of her 12/01/18 [place] Lower Extremity Venous Left (DVT). She has been walking more and is drinking an adequate amount of water. Pt has continued her smoking cessation.   Of note since the patient's last visit, pt has had [place] Lower Extremity Venous Left (DVT) ([id number]) completed on 12/01/2018 with results revealing "Right: No evidence of common femoral vein obstruction. Left: Findings consistent with acute deep vein thrombosis involving the left femoral vein, left popliteal vein, left posterior tibial veins, left  peroneal veins, left soleal veins, and left gastrocnemius veins. No cystic structure found in the popliteal fossa."  Lab results today (12/15/18) of CBC w/diff and CMP is as follows: all values are WNL except for RBC at 3.43, Hgb at 10.7, HCT at 32.3, Glucose at 106. 12/15/2018 TSH at 0.611  On review of systems, pt reports improving leg swelling and denies cough, phlegm, diarrhea, skin rashes, back pain, new bone pain, headaches, bleeding concerns, leg pain, abdominal pain, port issues and any other symptoms.   MEDICAL HISTORY:  Past Medical History:  Diagnosis Date   Acute blood loss anemia 04/02/2017   Anxiety    Gastrointestinal hemorrhage with melena    Headache    Hypercholesteremia    Hypertension    Persistent atrial fibrillation with rapid ventricular response 04/07/2017   Seizures ([place])    04/2017 while in hospital at [facility] [place]   Vitamin D deficiency     SURGICAL HISTORY: Past Surgical History:  Procedure Laterality Date   COLONOSCOPY Left 04/09/2017   Procedure: COLONOSCOPY;  Surgeon: Carol Ada, MD;  Location: [facility];  Service: Endoscopy;  Laterality: Left;   ESOPHAGOGASTRODUODENOSCOPY N/A 04/03/2017   Procedure: ESOPHAGOGASTRODUODENOSCOPY (EGD);  Surgeon: Carol Ada, MD;  Location: [facility];  Service: Endoscopy;  Laterality: N/A;   ESOPHAGOGASTRODUODENOSCOPY (EGD) WITH PROPOFOL N/A 04/12/2017   Procedure: ESOPHAGOGASTRODUODENOSCOPY (EGD) WITH PROPOFOL;  Surgeon: Milus Banister, MD;  Location: [facility];  Service: Endoscopy;  Laterality: N/A;   IR ANGIOGRAM SELECTIVE EACH ADDITIONAL VESSEL  04/12/2017   IR ANGIOGRAM SELECTIVE EACH ADDITIONAL VESSEL  04/13/2017   IR ANGIOGRAM SELECTIVE EACH ADDITIONAL VESSEL  04/13/2017   IR ANGIOGRAM SELECTIVE EACH ADDITIONAL VESSEL  04/13/2017   IR ANGIOGRAM VISCERAL SELECTIVE  04/12/2017   IR ANGIOGRAM VISCERAL SELECTIVE  04/13/2017   IR EMBO ART  VEN HEMORR LYMPH EXTRAV  INC GUIDE ROADMAPPING  04/12/2017   IR EMBO  ART  VEN HEMORR LYMPH EXTRAV  INC GUIDE ROADMAPPING  04/13/2017   IR FLUORO GUIDE CV LINE RIGHT  04/13/2017   IR FLUORO GUIDE PORT INSERTION RIGHT  07/27/2017   IR IVC FILTER PLMT / S&I /IMG GUID/MOD SED  04/12/2017   IR US GUIDE VASC ACCESS RIGHT  04/12/2017   IR US GUIDE VASC ACCESS RIGHT  04/13/2017   IR US GUIDE VASC ACCESS RIGHT  04/13/2017   IR US GUIDE VASC ACCESS RIGHT  07/27/2017   LEG SURGERY  1974   Blood Clot Removal    VIDEO BRONCHOSCOPY WITH ENDOBRONCHIAL ULTRASOUND N/A 05/19/2017   Procedure: VIDEO BRONCHOSCOPY WITH ENDOBRONCHIAL ULTRASOUND;  Surgeon: Grace Isaac, MD;  Location[facility];  Service: Thoracic;  Laterality: N/A;    SOCIAL HISTORY: Social History   Socioeconomic History   Marital status: Single    Spouse name: Not on file   Number of children: Not on file   Years of education: Not on file   Highest education level: Not on file  Occupational History   Occupation: Has to lift heavy boxes at times.    Employer: [employer]  Scientist, product/process development strain: Not on file   Food insecurity    Worry: Not on file    Inability: Not on file   Transportation needs    Medical: Not on file    Non-medical: Not on file  Tobacco Use   Smoking status: Former Smoker    Packs/day: 0.50    Years: 40.00    Pack years: 20.00    Types: Cigarettes    Quit date: 03/31/2017    Years since quitting: 1.7   Smokeless tobacco: Never Used  Substance and Sexual Activity   Alcohol use: No    Frequency: Never   Drug use: No   Sexual activity: Not Currently  Lifestyle   Physical activity    Days per week: Not on file    Minutes per session: Not on file   Stress: Not on file  Relationships   Social connections    Talks on phone: Not on file    Gets together: Not on file    Attends religious service: Not on file    Active member of club or organization: Not on file    Attends meetings of clubs or organizations: Not on file    Relationship status:  Not on file   Intimate partner violence    Fear of current or ex partner: No    Emotionally abused: No    Physically abused: No    Forced sexual activity: No  Other Topics Concern   Not on file  Social History Narrative   Not on file    FAMILY HISTORY: Family History  Problem Relation Age of Onset   Heart disease Father     ALLERGIES:  has No Known Allergies.  MEDICATIONS:  Current Outpatient Medications  Medication Sig Dispense Refill   acetaminophen (TYLENOL) 325 MG tablet Take 1-2 tablets (325-650 mg total)  by mouth every 4 (four) hours as needed for mild pain.     budesonide-formoterol (SYMBICORT) 80-4.5 MCG/ACT inhaler Inhale 2 puffs into the lungs 2 (two) times daily. 1 Inhaler 12   cholecalciferol (VITAMIN D) 1000 units tablet Take 1,000 Units by mouth 2 (two) times daily.     Eliquis DVT/PE Starter Pack (ELIQUIS STARTER PACK) 5 MG TABS Take as directed on package: start with two-77m tablets twice daily for 7 days. On day 8, switch to one-521mtablet twice daily. 1 each 0   levETIRAcetam (KEPPRA) 500 MG tablet TK 1 T PO BID  6   levothyroxine (SYNTHROID) 88 MCG tablet Take 1 tablet (88 mcg total) by mouth daily. 60 tablet 3   lidocaine-prilocaine (EMLA) cream Apply to affected area once 30 g 3   lisinopril-hydrochlorothiazide (PRINZIDE,ZESTORETIC) 20-12.5 MG tablet Take 1 tablet by mouth daily.     LORazepam (ATIVAN) 0.5 MG tablet Take 2 tablets (1 mg total) by mouth once as needed for up to 1 dose (prior to PET/CT and MRI brain for anxiety). 6 tablet 0   ondansetron (ZOFRAN) 8 MG tablet Take 1 tablet (8 mg total) by mouth 2 (two) times daily as needed for refractory nausea / vomiting. Start on day 3 after chemo. 30 tablet 1   pantoprazole (PROTONIX) 40 MG tablet TK 1 T PO BID  6   polycarbophil (FIBERCON) 625 MG tablet Take 2 tablets (1,250 mg total) by mouth 2 (two) times daily. 120 tablet 0   potassium chloride SA (K-DUR,KLOR-CON) 20 MEQ tablet Take 1  tablet (20 mEq total) by mouth 2 (two) times daily. 60 tablet 0   saccharomyces boulardii (FLORASTOR) 250 MG capsule Take 250 mg by mouth 2 (two) times daily.     sucralfate (CARAFATE) 1 g tablet Crush and mix in 10-15 mL of water prior to swallowing qAC and HS (Patient not taking: Reported on 08/19/2018) 120 tablet 1   No current facility-administered medications for this visit.    Facility-Administered Medications Ordered in Other Visits  Medication Dose Route Frequency Provider Last Rate Last Dose   sodium chloride flush (NS) 0.9 % injection 10 mL  10 mL Intracatheter PRN KaBrunetta GeneraMD   10 mL at 08/10/18 1558   sodium chloride flush (NS) 0.9 % injection 10 mL  10 mL Intracatheter PRN KaBrunetta GeneraMD   10 mL at 12/15/18 1203    REVIEW OF SYSTEMS:    A 10+ POINT REVIEW OF SYSTEMS WAS OBTAINED including neurology, dermatology, psychiatry, cardiac, respiratory, lymph, extremities, GI, GU, Musculoskeletal, constitutional, breasts, reproductive, HEENT.  All pertinent positives are noted in the HPI.  All others are negative.     PHYSICAL EXAMINATION: ECOG PERFORMANCE STATUS: 1 - Symptomatic but completely ambulatory  Today's Vitals   12/15/18 0907  BP: (!) 122/59  Pulse: 94  Resp: 18  Temp: 98.5 F (36.9 C)  TempSrc: Oral  SpO2: 99%  Weight: 163 lb 4.8 oz (74.1 kg)  Height: 5' 6" (1.676 m)     Wt Readings from Last 3 Encounters:  12/15/18 163 lb 4.8 oz (74.1 kg)  11/17/18 159 lb 12.8 oz (72.5 kg)  11/03/18 159 lb 8 oz (72.3 kg)   Body mass index is 26.36 kg/m.   GENERAL:alert, in no acute distress and comfortable SKIN: no acute rashes, no significant lesions EYES: conjunctiva are pink and non-injected, sclera anicteric OROPHARYNX: MMM, no exudates, no oropharyngeal erythema or ulceration NECK: supple, no JVD LYMPH:  no palpable lymphadenopathy  in the cervical, axillary or inguinal regions LUNGS: clear to auscultation b/l with normal respiratory  effort HEART: regular rate & rhythm ABDOMEN:  normoactive bowel sounds , non tender, not distended. No palpable hepatosplenomegaly.  Extremity: no pedal edema PSYCH: alert & oriented x 3 with fluent speech NEURO: no focal motor/sensory deficits  LABORATORY DATA:  I have reviewed the data as listed  . CBC Latest Ref Rng & Units 12/15/2018 12/01/2018 11/17/2018  WBC 4.0 - 10.5 K/uL 4.9 6.4 7.7  Hemoglobin 12.0 - 15.0 g/dL 10.7(L) 10.8(L) 11.5(L)  Hematocrit 36.0 - 46.0 % 32.3(L) 32.8(L) 35.0(L)  Platelets 150 - 400 K/uL 238 209 221    CMP Latest Ref Rng & Units 12/15/2018 12/01/2018 11/17/2018  Glucose 70 - 99 mg/dL 106(H) 105(H) 109(H)  BUN 8 - 23 mg/dL 21 21 25(H)  Creatinine 0.44 - 1.00 mg/dL 0.78 0.79 0.90  Sodium 135 - 145 mmol/L 141 141 141  Potassium 3.5 - 5.1 mmol/L 3.7 3.4(L) 3.5  Chloride 98 - 111 mmol/L 107 110 109  CO2 22 - 32 mmol/L _0 Calcium 8.9 - 10.3 mg/dL 9.3 9.3 9.0  Total Protein 6.5 - 8.1 g/dL 6.8 6.8 6.7  Total Bilirubin 0.3 - 1.2 mg/dL 0.3 0.2(L) 0.4  Alkaline Phos 38 - 126 U/L 81 79 69  AST 15 - 41 U/L _1 ALT 0 - 44 U/L _2 05/19/17 Fine Needle Aspiration:  05/19/17 Bronchial Washing Specimen B:   RADIOGRAPHIC STUDIES: I have personally reviewed the radiological images as listed and agreed with the findings in the report. Vas [place] Lower Extremity Venous (dvt)  Result Date: 12/01/2018  Lower Venous Study Indications: Swelling.  Risk Factors: Cancer. Comparison Study: 09/22/2017 - Positive for DVT. Performing Technologist: Oliver Hum RVT  Examination Guidelines: A complete evaluation includes B-mode imaging, spectral Doppler, color Doppler, and power Doppler as needed of all accessible portions of each vessel. Bilateral testing is considered an integral part of a complete examination. Limited examinations for reoccurring indications may be performed as noted.  +-----+---------------+---------+-----------+----------+--------------+   RIGHT Compressibility Phasicity Spontaneity Properties Thrombus Aging  +-----+---------------+---------+-----------+----------+--------------+  CFV   Full            Yes       Yes                                    +-----+---------------+---------+-----------+----------+--------------+   +---------+---------------+---------+-----------+----------+--------------+  LEFT      Compressibility Phasicity Spontaneity Properties Thrombus Aging  +---------+---------------+---------+-----------+----------+--------------+  CFV       Full            Yes       Yes                                    +---------+---------------+---------+-----------+----------+--------------+  SFJ       Full                                                             +---------+---------------+---------+-----------+----------+--------------+  FV Prox   Partial         No  No                     Acute           +---------+---------------+---------+-----------+----------+--------------+  FV Mid    Partial         No        No                     Acute           +---------+---------------+---------+-----------+----------+--------------+  FV Distal None            No        No                     Acute           +---------+---------------+---------+-----------+----------+--------------+  PFV       Full                                                             +---------+---------------+---------+-----------+----------+--------------+  POP       Partial         No        No                     Acute           +---------+---------------+---------+-----------+----------+--------------+  PTV       Partial                                          Acute           +---------+---------------+---------+-----------+----------+--------------+  PERO      Partial                                          Acute           +---------+---------------+---------+-----------+----------+--------------+  Soleal    None                                              Acute           +---------+---------------+---------+-----------+----------+--------------+  Gastroc   Partial                                          Acute           +---------+---------------+---------+-----------+----------+--------------+     Summary: Right: No evidence of common femoral vein obstruction. Left: Findings consistent with acute deep vein thrombosis involving the left femoral vein, left popliteal vein, left posterior tibial veins, left peroneal veins, left soleal veins, and left gastrocnemius veins. No cystic structure found in the popliteal fossa.  *See table(s) above for measurements and observations. Electronically signed by Harold Barban MD on 12/01/2018 at 2:14:48 PM.    Final    MRI brain 04/21/2017: IMPRESSION: 1. Motion  degraded examination. 2. 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 3. Otherwise negative MRI of the head with and without contrast for age.  Electronically Signed   By: Elon Alas M.D.   On: 04/22/2017 00:34    ASSESSMENT & PLAN:   63 y.o. female with  1.  Non-Small Cell Carcinoma atleast Stage IIIB  Not enough tissue for further characterization or mutation testing. Presenting with mediastinal nodal mass - TxN2 Mx (Atleast Stage III disease) ? Lingular primary vs inflammation. No overt evidence of metastatic disease.  -EGFR mutation studies on blood negative. Patient is s/p 3 cycles of Carboplatin/taxol -resolution left upper lobe ground-glass opacity has resolved in the interval. 08/19/17 PET revealed  No significant change in size of hypermetabolic AP window mass. The degree of FDG uptake is slightly increased in the interval. 2. Persistent hypermetabolic focus involving the distal aspect of the tongue within SUV max of 16.46. Similar to previous exam. (no clinical co-relate). Lung involvement has decreased.   08/19/17 Brain MRI revealed No evidence of intracranial metastases. Interval  resolution of bilateral parietal signal abnormality favoring PRES  Completed concurrent Carbo/taxol chemotherapy + RT  12/24/17 PET/CT revealed Marked interval response to therapy with large AP window lymph nodes seen on the previous study almost imperceptible on today's exam. Hypermetabolism within this lesion has resolved with FDG accumulation now at background blood pool levels. 2. Small focus of hypermetabolism in the anal region, similar to prior and indeterminate by PET imaging. 3. Hypermetabolic focus at the distal tongue has decreased in the Interval. 4. Marked pelvic floor laxity with cystocele. 5. Stable tiny left lung nodules. 6.  Aortic Atherosclerois. 7.  Emphysema.   04/03/17 endoscopy did not reveal esophageal thickening    04/27/18 CT Chest revealed Stable exam. No new or progressive findings. 2. Stable mild circumferential wall thickening mid esophagus. Esophagitis is a consideration. 3. Stable tiny left pulmonary nodules. 4.  Emphysema. 5.  Aortic Atherosclerois.  2. Bone Pain from neulasta - managed with Tylenol  3. Previous h/ot SZ -currently on Keppra. MRI brain - resolution of findings of PRES  4. H/o rt calf DVT - off anticoagulation due to massive GI bleeding. S/p IVC filter. No issues currently  5. S/p previous GI bleeding from Gastric ulcer --needing clipping and arterial embolization. On PPI BID Ulcers seen to be healed with recent April 2020 repeat endoscopy  6. Low Magnesium Mg 1.6 -On oral magnesium 224m BID  7. H/o ctx /steroid related thrush --now resolved.  8. Elevated TSH/Hypothyroidism -- ? Related to immunotherapy 07/13/18 Thyroid functions revealed TSH at 62.91 and Free T4 at 0.30 Began low dose 510m Levothyroxine and provide endocrinology referral for further optimization. Recommend taking Levothyroxine 1.5 hours before to breakfast with only water and no other medications.  F/u with Dr. ShKelton Pillarn Endocrinology   PLAN: -Discussed pt labwork today,  12/15/18; all values are WNL except for RBC at 3.43, Hgb at 10.7, HCT at 32.3, Glucose at 106. -Discussed 12/15/2018 TSH at 0.611 -Discussed 12/01/2018 [place] Extremity Venous Left (DVT) ([id number] revealed "Right: No evidence of common femoral vein obstruction. Left: Findings consistent with acute deep vein thrombosis involving the left femoral vein, left popliteal vein, left posterior tibial veins, left peroneal veins, left soleal veins, and left gastrocnemius veins. No cystic structure found in the popliteal fossa." -Continue Levothyroxine 88 MCG -The pt has no prohibitive toxicities from continuing C23 Durvalumab at this time. -No labs or clinical symptoms suggestive of lung  cancer progression at this time. -Will repeat CT Chest/Abd/Pel in 6 weeks -If labs and scans are stable after C24 we will discontinue treatment and pursue active surveillance -Will see back in 7 weeks with labs   FOLLOW UP: -Follow-up as per schedule appointment for port flush labs and last planned dose of durvalumab on 12/29/2018 -CT chest abdomen pelvis in 6 weeks -Return to clinic with Dr. Irene Limbo with labs in 7 weeks   The total time spent in the appt was 25 minutes and more than 50% was on counseling and direct patient cares.  All of the patient's questions were answered with apparent satisfaction. The patient knows to call the clinic with any problems, questions or concerns.  Sullivan Lone MD [place] AAHIVMS [facility] Hematology/Oncology Physician [facility]  (Office):       [phone] (Work cell):  [phone] (Fax):           [phone]  12/15/2018 12:11 PM  I, [name], am acting as a scribe for Dr. Sullivan Lone.   .I have reviewed the above documentation for accuracy and completeness, and I agree with the above. Brunetta Genera MD

## 2018-12-15 ENCOUNTER — Inpatient Hospital Stay (HOSPITAL_BASED_OUTPATIENT_CLINIC_OR_DEPARTMENT_OTHER): Payer: PPO | Admitting: Hematology

## 2018-12-15 ENCOUNTER — Other Ambulatory Visit: Payer: Self-pay

## 2018-12-15 ENCOUNTER — Inpatient Hospital Stay: Payer: PPO | Attending: Hematology

## 2018-12-15 ENCOUNTER — Inpatient Hospital Stay: Payer: PPO

## 2018-12-15 VITALS — BP 122/59 | HR 94 | Temp 98.5°F | Resp 18 | Ht 66.0 in | Wt 163.3 lb

## 2018-12-15 DIAGNOSIS — Z5112 Encounter for antineoplastic immunotherapy: Secondary | ICD-10-CM

## 2018-12-15 DIAGNOSIS — Z79899 Other long term (current) drug therapy: Secondary | ICD-10-CM | POA: Insufficient documentation

## 2018-12-15 DIAGNOSIS — E032 Hypothyroidism due to medicaments and other exogenous substances: Secondary | ICD-10-CM

## 2018-12-15 DIAGNOSIS — C3490 Malignant neoplasm of unspecified part of unspecified bronchus or lung: Secondary | ICD-10-CM | POA: Insufficient documentation

## 2018-12-15 DIAGNOSIS — Z95828 Presence of other vascular implants and grafts: Secondary | ICD-10-CM

## 2018-12-15 DIAGNOSIS — Z5111 Encounter for antineoplastic chemotherapy: Secondary | ICD-10-CM

## 2018-12-15 DIAGNOSIS — C383 Malignant neoplasm of mediastinum, part unspecified: Secondary | ICD-10-CM

## 2018-12-15 LAB — CBC WITH DIFFERENTIAL/PLATELET
Abs Immature Granulocytes: 0.02 10*3/uL (ref 0.00–0.07)
Basophils Absolute: 0.1 10*3/uL (ref 0.0–0.1)
Basophils Relative: 1 %
Eosinophils Absolute: 0.2 10*3/uL (ref 0.0–0.5)
Eosinophils Relative: 4 %
HCT: 32.3 % — ABNORMAL LOW (ref 36.0–46.0)
Hemoglobin: 10.7 g/dL — ABNORMAL LOW (ref 12.0–15.0)
Immature Granulocytes: 0 %
Lymphocytes Relative: 23 %
Lymphs Abs: 1.1 10*3/uL (ref 0.7–4.0)
MCH: 31.2 pg (ref 26.0–34.0)
MCHC: 33.1 g/dL (ref 30.0–36.0)
MCV: 94.2 fL (ref 80.0–100.0)
Monocytes Absolute: 0.5 10*3/uL (ref 0.1–1.0)
Monocytes Relative: 10 %
Neutro Abs: 3 10*3/uL (ref 1.7–7.7)
Neutrophils Relative %: 62 %
Platelets: 238 10*3/uL (ref 150–400)
RBC: 3.43 MIL/uL — ABNORMAL LOW (ref 3.87–5.11)
RDW: 13.6 % (ref 11.5–15.5)
WBC: 4.9 10*3/uL (ref 4.0–10.5)
nRBC: 0 % (ref 0.0–0.2)

## 2018-12-15 LAB — CMP (CANCER CENTER ONLY)
ALT: 15 U/L (ref 0–44)
AST: 19 U/L (ref 15–41)
Albumin: 3.5 g/dL (ref 3.5–5.0)
Alkaline Phosphatase: 81 U/L (ref 38–126)
Anion gap: 9 (ref 5–15)
BUN: 21 mg/dL (ref 8–23)
CO2: 25 mmol/L (ref 22–32)
Calcium: 9.3 mg/dL (ref 8.9–10.3)
Chloride: 107 mmol/L (ref 98–111)
Creatinine: 0.78 mg/dL (ref 0.44–1.00)
GFR, Est AFR Am: >60 mL/min (ref >60)
GFR, Estimated: >60 mL/min (ref >60)
Glucose, Bld: 106 mg/dL — ABNORMAL HIGH (ref 70–99)
Potassium: 3.7 mmol/L (ref 3.5–5.1)
Sodium: 141 mmol/L (ref 135–145)
Total Bilirubin: 0.3 mg/dL (ref 0.3–1.2)
Total Protein: 6.8 g/dL (ref 6.5–8.1)

## 2018-12-15 LAB — TSH: TSH: 0.611 u[IU]/mL (ref 0.308–3.960)

## 2018-12-15 MED ORDER — SODIUM CHLORIDE 0.9 % IV SOLN
10.0000 mg/kg | Freq: Once | INTRAVENOUS | Status: AC
  Administered 2018-12-15: 740 mg via INTRAVENOUS
  Filled 2018-12-15: qty 14.8

## 2018-12-15 MED ORDER — HEPARIN SOD (PORK) LOCK FLUSH 100 UNIT/ML IV SOLN
500.0000 [IU] | Freq: Once | INTRAVENOUS | Status: AC | PRN
  Administered 2018-12-15: 500 [IU]
  Filled 2018-12-15: qty 5

## 2018-12-15 MED ORDER — SODIUM CHLORIDE 0.9 % IV SOLN
Freq: Once | INTRAVENOUS | Status: AC
  Administered 2018-12-15: 10:00:00 via INTRAVENOUS
  Filled 2018-12-15: qty 250

## 2018-12-15 MED ORDER — SODIUM CHLORIDE 0.9% FLUSH
10.0000 mL | INTRAVENOUS | Status: DC | PRN
  Administered 2018-12-15: 10 mL
  Filled 2018-12-15: qty 10

## 2018-12-15 NOTE — Patient Instructions (Signed)
[facility] Discharge Instructions for Patients Receiving Chemotherapy  Today you received the following chemotherapy agents: Durvalumab (Infimzi)  To help prevent nausea and vomiting after your treatment, we encourage you to take your nausea medication as directed.   If you develop nausea and vomiting that is not controlled by your nausea medication, call the clinic.   BELOW ARE SYMPTOMS THAT SHOULD BE REPORTED IMMEDIATELY:  *FEVER GREATER THAN 100.5 F  *CHILLS WITH OR WITHOUT FEVER  NAUSEA AND VOMITING THAT IS NOT CONTROLLED WITH YOUR NAUSEA MEDICATION  *UNUSUAL SHORTNESS OF BREATH  *UNUSUAL BRUISING OR BLEEDING  TENDERNESS IN MOUTH AND THROAT WITH OR WITHOUT PRESENCE OF ULCERS  *URINARY PROBLEMS  *BOWEL PROBLEMS  UNUSUAL RASH Items with * indicate a potential emergency and should be followed up as soon as possible.  Feel free to call the clinic should you have any questions or concerns. The clinic phone number is [phone].  Please show the [place] at check-in to the Emergency Department and triage nurse.

## 2018-12-15 NOTE — Progress Notes (Signed)
12/15/2018 Imfinzi 120mg  vial, Exp 07/07/2020, Lot [id number] x 2 vials Imfinzi 500mg  vial, Exp 09/06/2020, Lot [id number] x 1 vial No waste

## 2018-12-16 ENCOUNTER — Telehealth: Payer: Self-pay | Admitting: Hematology

## 2018-12-16 NOTE — Telephone Encounter (Signed)
Scheduled appt per 10/7 los.  Patient will a get print out at her next appt.

## 2018-12-21 DIAGNOSIS — C3490 Malignant neoplasm of unspecified part of unspecified bronchus or lung: Secondary | ICD-10-CM | POA: Diagnosis not present

## 2018-12-21 DIAGNOSIS — I10 Essential (primary) hypertension: Secondary | ICD-10-CM | POA: Diagnosis not present

## 2018-12-21 DIAGNOSIS — J438 Other emphysema: Secondary | ICD-10-CM | POA: Diagnosis not present

## 2018-12-21 DIAGNOSIS — E7800 Pure hypercholesterolemia, unspecified: Secondary | ICD-10-CM | POA: Diagnosis not present

## 2018-12-22 ENCOUNTER — Other Ambulatory Visit: Payer: Self-pay | Admitting: Medical

## 2018-12-23 ENCOUNTER — Ambulatory Visit: Payer: PPO | Admitting: Internal Medicine

## 2018-12-23 NOTE — Progress Notes (Deleted)
Name: Kayla Wyatt  MRN/ DOB: [id number], 03/26/1955    Age/ Sex: 63 y.o., female     PCP: [name], MD   Reason for Endocrinology Evaluation: Hypothyroidism     Initial Endocrinology Clinic Visit: 08/19/18    PATIENT IDENTIFIER: Kayla Wyatt is a 63 y.o., female with a past medical history of Non-small cell Cacrinoma ( S/p chemo and aradiation ).  She has followed with [facility] since 08/19/2018 for consultative assistance with management of her hypothyroidism   HISTORICAL SUMMARY: She was diagnosed with non-small cell lung cancer in 03/2017. She is s/p carboplatin/taxol and radiation therapy. She was started on Durvalumab infusions in 01/2018.   Pt was noted to have a high TSH on routine labs through [facility] at 34.114 uIU/mL. Repeat Labs confirmed hypothyroidism with a TSH 62.910 uIU/mL and low FT4 at 0.3 ng/dL.   Pt was started on Levothyroxine 50 mcg on 07/27/2018 No FH of thyroid disease.  SUBJECTIVE:   During last visit (08/19/2018): INcreased Levothyroxine to 88 mcg daily   Today (12/23/2018):  Kayla Wyatt is here for a follow up on hypothyroidism .   ROS:  As per HPI.   HISTORY:  Past Medical History:  Past Medical History:  Diagnosis Date  . Acute blood loss anemia 04/02/2017  . Anxiety   . Gastrointestinal hemorrhage with melena   . Headache   . Hypercholesteremia   . Hypertension   . Persistent atrial fibrillation with rapid ventricular response 04/07/2017  . Seizures ([place])    04/2017 while in hospital at [facility]  . Vitamin D deficiency     Past Surgical History:  Past Surgical History:  Procedure Laterality Date  . COLONOSCOPY Left 04/09/2017   Procedure: COLONOSCOPY;  Surgeon: Carol Ada, MD;  Location: [facility];  Service: Endoscopy;  Laterality: Left;  . ESOPHAGOGASTRODUODENOSCOPY N/A 04/03/2017   Procedure: ESOPHAGOGASTRODUODENOSCOPY (EGD);  Surgeon: Carol Ada, MD;  Location: [facility];  Service: Endoscopy;   Laterality: N/A;  . ESOPHAGOGASTRODUODENOSCOPY (EGD) WITH PROPOFOL N/A 04/12/2017   Procedure: ESOPHAGOGASTRODUODENOSCOPY (EGD) WITH PROPOFOL;  Surgeon: Milus Banister, MD;  [facility] END[facility];  Service: Endoscopy;  Laterality: N/A;  . IR ANGIOGRAM SELECTIVE EACH ADDITIONAL VESSEL  04/12/2017  . IR ANGIOGRAM SELECTIVE EACH ADDITIONAL VESSEL  04/13/2017  . IR ANGIOGRAM SELECTIVE EACH ADDITIONAL VESSEL  04/13/2017  . IR ANGIOGRAM SELECTIVE EACH ADDITIONAL VESSEL  04/13/2017  . IR ANGIOGRAM VISCERAL SELECTIVE  04/12/2017  . IR ANGIOGRAM VISCERAL SELECTIVE  04/13/2017  . IR EMBO ART  VEN HEMORR LYMPH EXTRAV  INC GUIDE ROADMAPPING  04/12/2017  . IR EMBO ART  VEN HEMORR LYMPH EXTRAV  INC GUIDE ROADMAPPING  04/13/2017  . IR FLUORO GUIDE CV LINE RIGHT  04/13/2017  . IR FLUORO GUIDE PORT INSERTION RIGHT  07/27/2017  . IR IVC FILTER PLMT / S&I /IMG GUID/MOD SED  04/12/2017  . IR US GUIDE VASC ACCESS RIGHT  04/12/2017  . IR US GUIDE VASC ACCESS RIGHT  04/13/2017  . IR US GUIDE VASC ACCESS RIGHT  04/13/2017  . IR US GUIDE VASC ACCESS RIGHT  07/27/2017  . LEG SURGERY  1974   Blood Clot Removal   . VIDEO BRONCHOSCOPY WITH ENDOBRONCHIAL ULTRASOUND N/A 05/19/2017   Procedure: VIDEO BRONCHOSCOPY WITH ENDOBRONCHIAL ULTRASOUND;  Surgeon: Grace Isaac, MD;  Location: [facility];  Service: Thoracic;  Laterality: N/A;     Social History:  reports that she quit smoking about 20 months ago. Her smoking use included cigarettes. She  has a 20.00 pack-year smoking history. She has never used smokeless tobacco. She reports that she does not drink alcohol or use drugs. Family History:  Family History  Problem Relation Age of Onset  . Heart disease Father       HOME MEDICATIONS: Allergies as of 12/23/2018   No Known Allergies     Medication List       Accurate as of December 23, 2018  7:18 AM. If you have any questions, ask your nurse or doctor.        acetaminophen 325 MG tablet Commonly known as: TYLENOL Take 1-2 tablets  (325-650 mg total) by mouth every 4 (four) hours as needed for mild pain.   budesonide-formoterol 80-4.5 MCG/ACT inhaler Commonly known as: SYMBICORT Inhale 2 puffs into the lungs 2 (two) times daily.   cholecalciferol 1000 units tablet Commonly known as: VITAMIN D Take 1,000 Units by mouth 2 (two) times daily.   Eliquis DVT/PE Starter Pack 5 MG Tabs Take as directed on package: start with two-5mg  tablets twice daily for 7 days. On day 8, switch to one-5mg  tablet twice daily.   levETIRAcetam 500 MG tablet Commonly known as: KEPPRA TK 1 T PO BID   levothyroxine 88 MCG tablet Commonly known as: SYNTHROID Take 1 tablet (88 mcg total) by mouth daily.   lidocaine-prilocaine cream Commonly known as: EMLA Apply to affected area once   lisinopril-hydrochlorothiazide 20-12.5 MG tablet Commonly known as: ZESTORETIC Take 1 tablet by mouth daily.   LORazepam 0.5 MG tablet Commonly known as: ATIVAN Take 2 tablets (1 mg total) by mouth once as needed for up to 1 dose (prior to PET/CT and MRI brain for anxiety).   ondansetron 8 MG tablet Commonly known as: Zofran Take 1 tablet (8 mg total) by mouth 2 (two) times daily as needed for refractory nausea / vomiting. Start on day 3 after chemo.   pantoprazole 40 MG tablet Commonly known as: PROTONIX TK 1 T PO BID   polycarbophil 625 MG tablet Commonly known as: FIBERCON Take 2 tablets (1,250 mg total) by mouth 2 (two) times daily.   potassium chloride SA 20 MEQ tablet Commonly known as: KLOR-CON Take 1 tablet (20 mEq total) by mouth 2 (two) times daily.   saccharomyces boulardii 250 MG capsule Commonly known as: FLORASTOR Take 250 mg by mouth 2 (two) times daily.   sucralfate 1 g tablet Commonly known as: Carafate Crush and mix in 10-15 mL of water prior to swallowing qAC and HS         OBJECTIVE:   PHYSICAL EXAM: VS: There were no vitals taken for this visit.   EXAM: General: Pt appears well and is in NAD  Hydration:  Well-hydrated with moist mucous membranes and good skin turgor  Eyes: External eye exam normal without stare, lid lag or exophthalmos.  EOM intact.  PERRL.  Ears, Nose, Throat: Hearing: Grossly intact bilaterally Dental: Good dentition  Throat: Clear without mass, erythema or exudate  Neck: General: Supple without adenopathy. Thyroid: Thyroid size normal.  No goiter or nodules appreciated. No thyroid bruit.  Lungs: Clear with good BS bilat with no rales, rhonchi, or wheezes  Heart: Auscultation: RRR.  Abdomen: Normoactive bowel sounds, soft, nontender, without masses or organomegaly palpable  Extremities: Gait and station: Normal gait  Digits and nails: No clubbing, cyanosis, petechiae, or nodes Head and neck: Normal alignment and mobility BL UE: Normal ROM and strength. BL LE: No pretibial edema normal ROM and strength.  Skin: Hair:  Texture and amount normal with gender appropriate distribution Skin Inspection: No rashes, acanthosis nigricans/skin tags. No lipohypertrophy Skin Palpation: Skin temperature, texture, and thickness normal to palpation  Neuro: Cranial nerves: II - XII grossly intact  Cerebellar: Normal coordination and movement; no tremor Motor: Normal strength throughout DTRs: 2+ and symmetric in UE without delay in relaxation phase  Mental Status: Judgment, insight: Intact Orientation: Oriented to time, place, and person Memory: Intact for recent and remote events Mood and affect: No depression, anxiety, or agitation     DATA REVIEWED: ***    ASSESSMENT / PLAN / RECOMMENDATIONS:   1. Hypothyroidism   Plan:  ***    Medications   ***   Signed electronically by: Mack Guise, MD  [facility] Endocrinology  [facility] [address] [place], [place] [address] Phone: [phone] FAX: [phone]      CC: [name], MD [place] [address] Phone: [phone]  Fax:  [phone]   Return to Endocrinology clinic as below: Future Appointments  Date Time Provider [place]  12/23/2018 11:10 AM Juanette Urizar, Melanie Crazier, MD [facility] None  12/29/2018  9:00 AM [facility] ONLY [facility] None  12/29/2018  9:15 AM [facility] FLUSH [facility] None  12/29/2018 10:00 AM [facility] INFUSION [facility] None  02/02/2019 11:00 AM [facility] LAB 6 [facility] None  02/02/2019 11:40 AM Brunetta Genera, MD [facility] None

## 2018-12-29 ENCOUNTER — Other Ambulatory Visit: Payer: Self-pay | Admitting: Medical

## 2018-12-29 ENCOUNTER — Other Ambulatory Visit: Payer: Self-pay

## 2018-12-29 ENCOUNTER — Inpatient Hospital Stay: Payer: PPO

## 2018-12-29 VITALS — BP 136/50 | HR 65 | Temp 98.5°F | Resp 16

## 2018-12-29 DIAGNOSIS — C3490 Malignant neoplasm of unspecified part of unspecified bronchus or lung: Secondary | ICD-10-CM

## 2018-12-29 DIAGNOSIS — Z5112 Encounter for antineoplastic immunotherapy: Secondary | ICD-10-CM | POA: Diagnosis not present

## 2018-12-29 DIAGNOSIS — Z5111 Encounter for antineoplastic chemotherapy: Secondary | ICD-10-CM

## 2018-12-29 DIAGNOSIS — C383 Malignant neoplasm of mediastinum, part unspecified: Secondary | ICD-10-CM

## 2018-12-29 DIAGNOSIS — Z95828 Presence of other vascular implants and grafts: Secondary | ICD-10-CM

## 2018-12-29 DIAGNOSIS — E032 Hypothyroidism due to medicaments and other exogenous substances: Secondary | ICD-10-CM

## 2018-12-29 LAB — TSH: TSH: 0.421 u[IU]/mL (ref 0.308–3.960)

## 2018-12-29 LAB — CBC WITH DIFFERENTIAL/PLATELET
Abs Immature Granulocytes: 0.02 10*3/uL (ref 0.00–0.07)
Basophils Absolute: 0 10*3/uL (ref 0.0–0.1)
Basophils Relative: 1 %
Eosinophils Absolute: 0.2 10*3/uL (ref 0.0–0.5)
Eosinophils Relative: 4 %
HCT: 33 % — ABNORMAL LOW (ref 36.0–46.0)
Hemoglobin: 10.8 g/dL — ABNORMAL LOW (ref 12.0–15.0)
Immature Granulocytes: 0 %
Lymphocytes Relative: 23 %
Lymphs Abs: 1.2 10*3/uL (ref 0.7–4.0)
MCH: 31.1 pg (ref 26.0–34.0)
MCHC: 32.7 g/dL (ref 30.0–36.0)
MCV: 95.1 fL (ref 80.0–100.0)
Monocytes Absolute: 0.5 10*3/uL (ref 0.1–1.0)
Monocytes Relative: 9 %
Neutro Abs: 3.1 10*3/uL (ref 1.7–7.7)
Neutrophils Relative %: 63 %
Platelets: 220 10*3/uL (ref 150–400)
RBC: 3.47 MIL/uL — ABNORMAL LOW (ref 3.87–5.11)
RDW: 13.6 % (ref 11.5–15.5)
WBC: 5 10*3/uL (ref 4.0–10.5)
nRBC: 0 % (ref 0.0–0.2)

## 2018-12-29 LAB — CMP (CANCER CENTER ONLY)
ALT: 13 U/L (ref 0–44)
AST: 17 U/L (ref 15–41)
Albumin: 3.6 g/dL (ref 3.5–5.0)
Alkaline Phosphatase: 75 U/L (ref 38–126)
Anion gap: 12 (ref 5–15)
BUN: 32 mg/dL — ABNORMAL HIGH (ref 8–23)
CO2: 20 mmol/L — ABNORMAL LOW (ref 22–32)
Calcium: 9.3 mg/dL (ref 8.9–10.3)
Chloride: 109 mmol/L (ref 98–111)
Creatinine: 0.87 mg/dL (ref 0.44–1.00)
GFR, Est AFR Am: >60 mL/min (ref >60)
GFR, Estimated: >60 mL/min (ref >60)
Glucose, Bld: 120 mg/dL — ABNORMAL HIGH (ref 70–99)
Potassium: 4 mmol/L (ref 3.5–5.1)
Sodium: 141 mmol/L (ref 135–145)
Total Bilirubin: 0.3 mg/dL (ref 0.3–1.2)
Total Protein: 6.9 g/dL (ref 6.5–8.1)

## 2018-12-29 LAB — T4, FREE: Free T4: 1.32 ng/dL — ABNORMAL HIGH (ref 0.61–1.12)

## 2018-12-29 MED ORDER — SODIUM CHLORIDE 0.9 % IV SOLN
Freq: Once | INTRAVENOUS | Status: AC
  Administered 2018-12-29: 10:00:00 via INTRAVENOUS
  Filled 2018-12-29: qty 250

## 2018-12-29 MED ORDER — SODIUM CHLORIDE 0.9 % IV SOLN
10.0000 mg/kg | Freq: Once | INTRAVENOUS | Status: AC
  Administered 2018-12-29: 740 mg via INTRAVENOUS
  Filled 2018-12-29: qty 4.8

## 2018-12-29 MED ORDER — APIXABAN 5 MG PO TABS: 5.0000 mg | ORAL_TABLET | Freq: Every day | ORAL | 1 refills | Status: DC

## 2018-12-29 MED ORDER — HEPARIN SOD (PORK) LOCK FLUSH 100 UNIT/ML IV SOLN
500.0000 [IU] | Freq: Once | INTRAVENOUS | Status: AC | PRN
  Administered 2018-12-29: 500 [IU]
  Filled 2018-12-29: qty 5

## 2018-12-29 MED ORDER — SODIUM CHLORIDE 0.9% FLUSH
10.0000 mL | INTRAVENOUS | Status: DC | PRN
  Administered 2018-12-29: 10 mL
  Filled 2018-12-29: qty 10

## 2018-12-29 NOTE — Patient Instructions (Signed)
[facility] Discharge Instructions for Patients Receiving Chemotherapy  Today you received the following chemotherapy agents: Durvalumab (Infimzi)  To help prevent nausea and vomiting after your treatment, we encourage you to take your nausea medication as directed.   If you develop nausea and vomiting that is not controlled by your nausea medication, call the clinic.   BELOW ARE SYMPTOMS THAT SHOULD BE REPORTED IMMEDIATELY:  *FEVER GREATER THAN 100.5 F  *CHILLS WITH OR WITHOUT FEVER  NAUSEA AND VOMITING THAT IS NOT CONTROLLED WITH YOUR NAUSEA MEDICATION  *UNUSUAL SHORTNESS OF BREATH  *UNUSUAL BRUISING OR BLEEDING  TENDERNESS IN MOUTH AND THROAT WITH OR WITHOUT PRESENCE OF ULCERS  *URINARY PROBLEMS  *BOWEL PROBLEMS  UNUSUAL RASH Items with * indicate a potential emergency and should be followed up as soon as possible.  Feel free to call the clinic should you have any questions or concerns. The clinic phone number is [phone].  Please show the [facility] at check-in to the Emergency Department and triage nurse.

## 2019-01-19 DIAGNOSIS — C3490 Malignant neoplasm of unspecified part of unspecified bronchus or lung: Secondary | ICD-10-CM | POA: Diagnosis not present

## 2019-01-19 DIAGNOSIS — J438 Other emphysema: Secondary | ICD-10-CM | POA: Diagnosis not present

## 2019-01-19 DIAGNOSIS — I10 Essential (primary) hypertension: Secondary | ICD-10-CM | POA: Diagnosis not present

## 2019-01-19 DIAGNOSIS — E7800 Pure hypercholesterolemia, unspecified: Secondary | ICD-10-CM | POA: Diagnosis not present

## 2019-01-26 ENCOUNTER — Other Ambulatory Visit: Payer: Self-pay

## 2019-01-26 ENCOUNTER — Ambulatory Visit (HOSPITAL_COMMUNITY)
Admission: RE | Admit: 2019-01-26 | Discharge: 2019-01-26 | Disposition: A | Discharge status: Home / Self Care | Payer: PPO | Source: Ambulatory Visit | Attending: Hematology | Admitting: Hematology

## 2019-01-26 ENCOUNTER — Encounter (HOSPITAL_COMMUNITY): Payer: Self-pay

## 2019-01-26 DIAGNOSIS — Z5112 Encounter for antineoplastic immunotherapy: Secondary | ICD-10-CM | POA: Insufficient documentation

## 2019-01-26 DIAGNOSIS — J439 Emphysema, unspecified: Secondary | ICD-10-CM | POA: Diagnosis not present

## 2019-01-26 DIAGNOSIS — C3490 Malignant neoplasm of unspecified part of unspecified bronchus or lung: Secondary | ICD-10-CM | POA: Insufficient documentation

## 2019-01-26 DIAGNOSIS — Z5111 Encounter for antineoplastic chemotherapy: Secondary | ICD-10-CM | POA: Diagnosis not present

## 2019-01-26 HISTORY — DX: Malignant (primary) neoplasm, unspecified: C80.1

## 2019-01-26 MED ORDER — IOHEXOL 300 MG/ML  SOLN
100.0000 mL | Freq: Once | INTRAMUSCULAR | Status: AC | PRN
  Administered 2019-01-26: 100 mL via INTRAVENOUS

## 2019-01-26 MED ORDER — SODIUM CHLORIDE (PF) 0.9 % IJ SOLN
INTRAMUSCULAR | Status: AC
  Filled 2019-01-26: qty 50

## 2019-01-27 ENCOUNTER — Telehealth: Payer: Self-pay | Admitting: Hematology

## 2019-01-27 NOTE — Telephone Encounter (Signed)
c 

## 2019-02-02 ENCOUNTER — Ambulatory Visit: Payer: PPO | Admitting: Hematology

## 2019-02-02 ENCOUNTER — Other Ambulatory Visit: Payer: PPO

## 2019-02-09 ENCOUNTER — Telehealth: Payer: Self-pay | Admitting: *Deleted

## 2019-02-09 NOTE — Telephone Encounter (Signed)
Patient told [name] screener that she has a rash, she said that she has been waiting to see Dr. Irene Limbo for 6 weeks and does not know if the rash comes from her nerves because it has been to long waiting on results. Screener asked for nurse to contact patient. Contacted patient - she states she started developing rash (red bumps) around time of CT scan. She said she had a few bumps the day before. States she wondered if it was her nerves, because that has happened before and she is anxious waiting on news of scan. She plans to show it to Dr. Irene Limbo tomorrow at her appt.

## 2019-02-10 ENCOUNTER — Inpatient Hospital Stay: Payer: PPO | Attending: Hematology

## 2019-02-10 ENCOUNTER — Other Ambulatory Visit: Payer: Self-pay

## 2019-02-10 ENCOUNTER — Inpatient Hospital Stay (HOSPITAL_BASED_OUTPATIENT_CLINIC_OR_DEPARTMENT_OTHER): Payer: PPO | Admitting: Hematology

## 2019-02-10 VITALS — BP 125/59 | HR 107 | Temp 98.9°F | Resp 18 | Ht 66.0 in | Wt 159.0 lb

## 2019-02-10 DIAGNOSIS — Z86718 Personal history of other venous thrombosis and embolism: Secondary | ICD-10-CM | POA: Diagnosis not present

## 2019-02-10 DIAGNOSIS — C3490 Malignant neoplasm of unspecified part of unspecified bronchus or lung: Secondary | ICD-10-CM | POA: Insufficient documentation

## 2019-02-10 DIAGNOSIS — Z5112 Encounter for antineoplastic immunotherapy: Secondary | ICD-10-CM

## 2019-02-10 DIAGNOSIS — Z5111 Encounter for antineoplastic chemotherapy: Secondary | ICD-10-CM

## 2019-02-10 DIAGNOSIS — I4819 Other persistent atrial fibrillation: Secondary | ICD-10-CM | POA: Diagnosis not present

## 2019-02-10 DIAGNOSIS — I10 Essential (primary) hypertension: Secondary | ICD-10-CM | POA: Diagnosis not present

## 2019-02-10 DIAGNOSIS — R21 Rash and other nonspecific skin eruption: Secondary | ICD-10-CM

## 2019-02-10 DIAGNOSIS — E032 Hypothyroidism due to medicaments and other exogenous substances: Secondary | ICD-10-CM

## 2019-02-10 LAB — CBC WITH DIFFERENTIAL/PLATELET
Abs Immature Granulocytes: 0.02 10*3/uL (ref 0.00–0.07)
Basophils Absolute: 0.1 10*3/uL (ref 0.0–0.1)
Basophils Relative: 1 %
Eosinophils Absolute: 0.2 10*3/uL (ref 0.0–0.5)
Eosinophils Relative: 2 %
HCT: 34.1 % — ABNORMAL LOW (ref 36.0–46.0)
Hemoglobin: 10.9 g/dL — ABNORMAL LOW (ref 12.0–15.0)
Immature Granulocytes: 0 %
Lymphocytes Relative: 16 %
Lymphs Abs: 1.2 10*3/uL (ref 0.7–4.0)
MCH: 30.6 pg (ref 26.0–34.0)
MCHC: 32 g/dL (ref 30.0–36.0)
MCV: 95.8 fL (ref 80.0–100.0)
Monocytes Absolute: 0.6 10*3/uL (ref 0.1–1.0)
Monocytes Relative: 8 %
Neutro Abs: 5.3 10*3/uL (ref 1.7–7.7)
Neutrophils Relative %: 73 %
Platelets: 247 10*3/uL (ref 150–400)
RBC: 3.56 MIL/uL — ABNORMAL LOW (ref 3.87–5.11)
RDW: 14 % (ref 11.5–15.5)
WBC: 7.4 10*3/uL (ref 4.0–10.5)
nRBC: 0 % (ref 0.0–0.2)

## 2019-02-10 LAB — CMP (CANCER CENTER ONLY)
ALT: 15 U/L (ref 0–44)
AST: 18 U/L (ref 15–41)
Albumin: 3.8 g/dL (ref 3.5–5.0)
Alkaline Phosphatase: 74 U/L (ref 38–126)
Anion gap: 10 (ref 5–15)
BUN: 29 mg/dL — ABNORMAL HIGH (ref 8–23)
CO2: 25 mmol/L (ref 22–32)
Calcium: 9.4 mg/dL (ref 8.9–10.3)
Chloride: 110 mmol/L (ref 98–111)
Creatinine: 1.12 mg/dL — ABNORMAL HIGH (ref 0.44–1.00)
GFR, Est AFR Am: 60 mL/min — ABNORMAL LOW (ref >60)
GFR, Estimated: 52 mL/min — ABNORMAL LOW (ref >60)
Glucose, Bld: 128 mg/dL — ABNORMAL HIGH (ref 70–99)
Potassium: 3.9 mmol/L (ref 3.5–5.1)
Sodium: 145 mmol/L (ref 135–145)
Total Bilirubin: 0.2 mg/dL — ABNORMAL LOW (ref 0.3–1.2)
Total Protein: 7.2 g/dL (ref 6.5–8.1)

## 2019-02-10 LAB — TSH: TSH: 0.448 u[IU]/mL (ref 0.308–3.960)

## 2019-02-10 MED ORDER — CLOTRIMAZOLE-BETAMETHASONE 1-0.05 % EX CREA: 1.0000 "application " | TOPICAL_CREAM | Freq: Two times a day (BID) | CUTANEOUS | 0 refills | Status: DC

## 2019-02-10 NOTE — Progress Notes (Signed)
[name]    HEMATOLOGY/ONCOLOGY CLINIC NOTE  Date of Service: 02/10/2019   Patient Care Team: [name], MD as PCP - General (Family Medicine)  CHIEF COMPLAINTS/PURPOSE OF CONSULTATION:   F/u for continued management of Non-Small Cell lung Carcinoma  HISTORY OF PRESENTING ILLNESS:   Kayla Wyatt is a wonderful 63 y.o. female who has been referred to [place] by Dr [name] for evaluation and management of Non-Small Cell Carcinoma. She is accompanied today by her husband. The pt reports that she is doing well overall.   Patient recently had a significant hospitalization on 04/02/2017 - as per DC summary by [name] " with abdominal pain, hematemesis, melena and ABLA due to GIB.  She underwent clipping of gastric ulcer and received multiple units PRBC but continued to have drop in H/H with bloody stool. NM GI scan was negative for bleeding and she underwent colonoscopy by Dr. [name] 1/31 showing diverticulosis but no signs of bleeding. Hospital course significant for R- gastrocnemius DVT, PAF, incidental findings of mediastinal mass as well as brief episode of hematuria. She was cleared to start on Xarelto on 02/01 for treatment of DVT. On 2/2 am she developed hematemesis with maroon stools and hypotension requiring fluid bolus as well as 2 units PRBCs. She continued to decline requiring intubation, pressors as well as reversal of anticoagulation with Kcentra on 02/3. She underwent UGI revealing large clot with fresh blood in stomach and underwent mesenteric arteriogram with percutaneous coil embolization fo distal tributary of left gastric artery and placement of IVC filter by interventional radiology.   A fib with RVR felt to be due to hemorrhagic shock and she converted to NSR. [name] felt no further cardiac work up indicated and did not recommend initiating anticoagulation in short term. To follow up with [name] after PET scan and routine biopsy after medically stable. She  continued to have bleeding and underwent visceral angiogram with embolization of inferior division of splenic artery and associated gastric arteries on 2/4. No surgical intervention needed per [name] with recommendations to continue monitoring H/H as well as for recurrence of hematochezia. She tolerated extubation on 2/6 and respiratory status improving.  On 2/11, she has episode of unresponsiveness with jerking of bilateral limbs that lasted about 3 minutes. She had amnesia of events but was back to baseline. EEG revealed "focal slowing over the right temporo-occipital regionandoccasional epileptiform discharges over the right occipital region". Head CT reviewed, unremarkable for acute intracranial process. MRI brain done revealing mild biparietal signal abnormality suspicious for PRES. [name] recommended starting patient on Keppra BID with repeat MRI in 2 weeks. New onset seizure likely provoked by PRES--if resolved--Ok to take patient off Keppra. Patient with resultant generalized weakness. CIR recommended due to functional deficits. "  Later as outpatient she had a PET scan on 05/14/17 which showed a hypermetabolic mediastinal mass which was subsequently biopsied with a bronchoscopy on 05/19/17. She notes that she had smoked for about 35-40 years, half a pack of a day. She reports that she has not had any other symptoms related to her smoking; she has never had to be on home oxygen or inhalers. She has recently begun Symbicort. While being in the hospital she lost about 20 lbs but has maintained her weight since being discharged.  The pt and her husband note that she is concerned about what to do if she is not able to go back to work after her 90-day leave. We recommended that they speak with  our social workers here to explore the available options for insurance coverage /disability application etc.  Of note prior to the patient's visit today, pt has had PET/CT completed on 05/14/17 with  results revealing 1. The known mediastinal mass is intensely hypermetabolic, worrisome for malignancy. This could reflect lymphoma or small cell lung cancer. Tissue sampling recommended.2. No peripheral lung mass identified. New patchy ground-glass opacity in the lingula is mildly hypermetabolic and likely inflammatory.  On 05/19/17 the pt had a Fine Needle Aspiration which revealed Non-Small cell lung carcinoma.  Most recent lab results (05/19/17) of CBC  is as follows: all values are WNL except for RBC at 2.90, Hgb at 8.6, HCT at 28.0, RDW at 16.4.  On review of systems, pt reports knee pain, and denies abdominal pains, leg swelling, SOB, CP, and any other symptoms.   On PMHx the pt reports acute blood loss anemia, GI hemorrhage with melena, hypercholesteremia, HTN, persistent atrial fibrillation with rapid ventricular response, Vitamin D deficiency, denies liver and kidney problems. On Social Hx the pt reports having smoked for 35-40 years of half a pack per day. She quit smoking on 03/31/17.  On Family Hx the pt reports that her father had heart disease.  Interval History:   Kayla Wyatt returns today regarding her Non-Small Cell Carcinoma. She is here for C23D1 of Durvalumab. The patient's last visit with [place] was on 12/15/2018. The pt reports that she is doing well overall.  The pt reports she has rashes on both legs and both arms. She noticed the rashes after scans.  She it not using any new cosmetics, detergents, and she does not have any seafood allergies.   She has been taking eliquis since September.  Of note since the patient's last visit, pt has had CT Abdomen Pelvis W Contrast (Accession [id number]) completed on 01/26/2019 with results revealing "1. Patchy areas of consolidation, bronchiectasis and ground-glass in the medial left hemithorax, presumably treatment related. No evidence of metastatic disease.2. Extensive debris in the mainstem bronchi, bronchus intermedius and left  upper lobe bronchus. 3. Aortic atherosclerosis (ICD10-170.0). Coronary artery Calcification. 4.  Emphysema (ICD10-J43.9)."  Of note since the patient's last visit, pt has had CT Chest W Contrast (Accession [id number]) completed on 01/26/2019 with results revealing "1. Patchy areas of consolidation, ronchiectasis and ground-glass in the medial left hemithorax, presumably treatment related. No evidence of metastatic disease. 2. Extensive debris in the mainstem bronchi, bronchus intermedius and left upper lobe bronchus. 3. Aortic atherosclerosis (ICD10-170.0). Coronary artery Calcification. 4.  Emphysema (ICD10-J43.9)."   Lab results today (02/10/19) of CBC w/diff and CMP is as follows: all values are WNL except for Glucose Bld at 128, BUN at 29, Creatinine at 1.12, Total Bilirubin at 0.2, GFR Est Non Af Am at 52, GFR Est AFR Am at 60, RBC at 3.56, Hemoglobin at 10.9, HCT 34., PENDING TSH.  On review of systems, pt reports that she has a rash on both of her lower legs and arms and denies abdominal pain, leg swelling, and any other symptoms.   MEDICAL HISTORY:  Past Medical History:  Diagnosis Date   Acute blood loss anemia 04/02/2017   Anxiety    Gastrointestinal hemorrhage with melena    Headache    Hypercholesteremia    Hypertension    lung ca dx'd 05/2017   Persistent atrial fibrillation with rapid ventricular response ([place]) 04/07/2017   Seizures ([place])    04/2017 while in hospital at [facility]   Vitamin D deficiency  SURGICAL HISTORY: Past Surgical History:  Procedure Laterality Date   COLONOSCOPY Left 04/09/2017   Procedure: COLONOSCOPY;  Surgeon: Carol Ada, MD;  Location: [place];  Service: Endoscopy;  Laterality: Left;   ESOPHAGOGASTRODUODENOSCOPY N/A 04/03/2017   Procedure: ESOPHAGOGASTRODUODENOSCOPY (EGD);  Surgeon: Carol Ada, MD;  Location: [place];  Service: Endoscopy;  Laterality: N/A;   ESOPHAGOGASTRODUODENOSCOPY (EGD) WITH PROPOFOL N/A 04/12/2017    Procedure: ESOPHAGOGASTRODUODENOSCOPY (EGD) WITH PROPOFOL;  Surgeon: Milus Banister, MD;  Location: [facility] ENDOSCOPY;  Service: Endoscopy;  Laterality: N/A;   IR ANGIOGRAM SELECTIVE EACH ADDITIONAL VESSEL  04/12/2017   IR ANGIOGRAM SELECTIVE EACH ADDITIONAL VESSEL  04/13/2017   IR ANGIOGRAM SELECTIVE EACH ADDITIONAL VESSEL  04/13/2017   IR ANGIOGRAM SELECTIVE EACH ADDITIONAL VESSEL  04/13/2017   IR ANGIOGRAM VISCERAL SELECTIVE  04/12/2017   IR ANGIOGRAM VISCERAL SELECTIVE  04/13/2017   IR EMBO ART  VEN HEMORR LYMPH EXTRAV  INC GUIDE ROADMAPPING  04/12/2017   IR EMBO ART  VEN HEMORR LYMPH EXTRAV  INC GUIDE ROADMAPPING  04/13/2017   IR FLUORO GUIDE CV LINE RIGHT  04/13/2017   IR FLUORO GUIDE PORT INSERTION RIGHT  07/27/2017   IR IVC FILTER PLMT / S&I /IMG GUID/MOD SED  04/12/2017   IR US GUIDE VASC ACCESS RIGHT  04/12/2017   IR US GUIDE VASC ACCESS RIGHT  04/13/2017   IR US GUIDE VASC ACCESS RIGHT  04/13/2017   IR US GUIDE VASC ACCESS RIGHT  07/27/2017   LEG SURGERY  1974   Blood Clot Removal    VIDEO BRONCHOSCOPY WITH ENDOBRONCHIAL ULTRASOUND N/A 05/19/2017   Procedure: VIDEO BRONCHOSCOPY WITH ENDOBRONCHIAL ULTRASOUND;  Surgeon: Grace Isaac, MD;  Location: [facility];  Service: Thoracic;  Laterality: N/A;    SOCIAL HISTORY: Social History   Socioeconomic History   Marital status: Single    Spouse name: Not on file   Number of children: Not on file   Years of education: Not on file   Highest education level: Not on file  Occupational History   Occupation: Has to lift heavy boxes at times.    Employer: [employer]  Scientist, product/process development strain: Not on file   Food insecurity    Worry: Not on file    Inability: Not on file   Transportation needs    Medical: Not on file    Non-medical: Not on file  Tobacco Use   Smoking status: Former Smoker    Packs/day: 0.50    Years: 40.00    Pack years: 20.00    Types: Cigarettes    Quit date: 03/31/2017    Years since quitting:  1.8   Smokeless tobacco: Never Used  Substance and Sexual Activity   Alcohol use: No    Frequency: Never   Drug use: No   Sexual activity: Not Currently  Lifestyle   Physical activity    Days per week: Not on file    Minutes per session: Not on file   Stress: Not on file  Relationships   Social connections    Talks on phone: Not on file    Gets together: Not on file    Attends religious service: Not on file    Active member of club or organization: Not on file    Attends meetings of clubs or organizations: Not on file    Relationship status: Not on file   Intimate partner violence    Fear of current or ex partner: No    Emotionally abused:  No    Physically abused: No    Forced sexual activity: No  Other Topics Concern   Not on file  Social History Narrative   Not on file    FAMILY HISTORY: Family History  Problem Relation Age of Onset   Heart disease Father     ALLERGIES:  has No Known Allergies.  MEDICATIONS:  Current Outpatient Medications  Medication Sig Dispense Refill   acetaminophen (TYLENOL) 325 MG tablet Take 1-2 tablets (325-650 mg total) by mouth every 4 (four) hours as needed for mild pain.     apixaban (ELIQUIS) 5 MG TABS tablet Take 1 tablet (5 mg total) by mouth daily. 30 tablet 1   budesonide-formoterol (SYMBICORT) 80-4.5 MCG/ACT inhaler Inhale 2 puffs into the lungs 2 (two) times daily. 1 Inhaler 12   cholecalciferol (VITAMIN D) 1000 units tablet Take 1,000 Units by mouth 2 (two) times daily.     levETIRAcetam (KEPPRA) 500 MG tablet TK 1 T PO BID  6   levothyroxine (SYNTHROID) 88 MCG tablet Take 1 tablet (88 mcg total) by mouth daily. 60 tablet 3   lidocaine-prilocaine (EMLA) cream Apply to affected area once 30 g 3   lisinopril-hydrochlorothiazide (PRINZIDE,ZESTORETIC) 20-12.5 MG tablet Take 1 tablet by mouth daily.     LORazepam (ATIVAN) 0.5 MG tablet Take 2 tablets (1 mg total) by mouth once as needed for up to 1 dose (prior to  PET/CT and MRI brain for anxiety). 6 tablet 0   ondansetron (ZOFRAN) 8 MG tablet Take 1 tablet (8 mg total) by mouth 2 (two) times daily as needed for refractory nausea / vomiting. Start on day 3 after chemo. 30 tablet 1   pantoprazole (PROTONIX) 40 MG tablet TK 1 T PO BID  6   polycarbophil (FIBERCON) 625 MG tablet Take 2 tablets (1,250 mg total) by mouth 2 (two) times daily. 120 tablet 0   potassium chloride SA (K-DUR,KLOR-CON) 20 MEQ tablet Take 1 tablet (20 mEq total) by mouth 2 (two) times daily. 60 tablet 0   saccharomyces boulardii (FLORASTOR) 250 MG capsule Take 250 mg by mouth 2 (two) times daily.     sucralfate (CARAFATE) 1 g tablet Crush and mix in 10-15 mL of water prior to swallowing qAC and HS (Patient not taking: Reported on 08/19/2018) 120 tablet 1   No current facility-administered medications for this visit.    Facility-Administered Medications Ordered in Other Visits  Medication Dose Route Frequency Provider Last Rate Last Dose   sodium chloride flush (NS) 0.9 % injection 10 mL  10 mL Intracatheter PRN Brunetta Genera, MD   10 mL at 08/10/18 1558    REVIEW OF SYSTEMS:   A 10+ POINT REVIEW OF SYSTEMS WAS OBTAINED including neurology, dermatology, psychiatry, cardiac, respiratory, lymph, extremities, GI, GU, Musculoskeletal, constitutional, breasts, reproductive, HEENT.  All pertinent positives are noted in the HPI.  All others are negative.    PHYSICAL EXAMINATION: ECOG FS:1 - Symptomatic but completely ambulatory  Vitals:   02/10/19 1340  BP: (!) 125/59  Pulse: (!) 107  Resp: 18  Temp: 98.9 F (37.2 C)  SpO2: 96%   Wt Readings from Last 3 Encounters:  02/10/19 159 lb (72.1 kg)  12/15/18 163 lb 4.8 oz (74.1 kg)  11/17/18 159 lb 12.8 oz (72.5 kg)   Body mass index is 25.66 kg/m.    GENERAL:alert, in no acute distress and comfortable SKIN: no acute rashes, no significant lesions EYES: conjunctiva are pink and non-injected, sclera  anicteric OROPHARYNX: MMM,  no exudates, no oropharyngeal erythema or ulceration NECK: supple, no JVD LYMPH:  no palpable lymphadenopathy in the cervical, axillary or inguinal regions LUNGS: clear to auscultation b/l with normal respiratory effort HEART: regular rate & rhythm ABDOMEN:  normoactive bowel sounds , non tender, not distended. Extremity: no pedal edema PSYCH: alert & oriented x 3 with fluent speech NEURO: no focal motor/sensory deficits   LABORATORY DATA:  I have reviewed the data as listed  . CBC Latest Ref Rng & Units 02/10/2019 12/29/2018 12/15/2018  WBC 4.0 - 10.5 K/uL 7.4 5.0 4.9  Hemoglobin 12.0 - 15.0 g/dL 10.9(L) 10.8(L) 10.7(L)  Hematocrit 36.0 - 46.0 % 34.1(L) 33.0(L) 32.3(L)  Platelets 150 - 400 K/uL 247 220 238    CMP Latest Ref Rng & Units 02/10/2019 12/29/2018 12/15/2018  Glucose 70 - 99 mg/dL 128(H) 120(H) 106(H)  BUN 8 - 23 mg/dL 29(H) 32(H) 21  Creatinine 0.44 - 1.00 mg/dL 1.12(H) 0.87 0.78  Sodium 135 - 145 mmol/L 145 141 141  Potassium 3.5 - 5.1 mmol/L 3.9 4.0 3.7  Chloride 98 - 111 mmol/L 110 109 107  CO2 22 - 32 mmol/L 25 20(L) 25  Calcium 8.9 - 10.3 mg/dL 9.4 9.3 9.3  Total Protein 6.5 - 8.1 g/dL 7.2 6.9 6.8  Total Bilirubin 0.3 - 1.2 mg/dL 0.2(L) 0.3 0.3  Alkaline Phos 38 - 126 U/L 74 75 81  AST 15 - 41 U/L '18 17 19  '$ ALT 0 - 44 U/L '15 13 15  '$ 01/26/2019 CT Abdomen Pelvis W Contrast (Accession [id number])    01/26/2019 CT Chest W Contrast (Accession [id number])   05/19/17 Fine Needle Aspiration:  05/19/17 Bronchial Washing Specimen B:   RADIOGRAPHIC STUDIES: I have personally reviewed the radiological images as listed and agreed with the findings in the report. Ct Chest W Contrast  Result Date: 01/26/2019 CLINICAL DATA:  Lung cancer treated with chemo radiation and immunotherapy. EXAM: CT CHEST, ABDOMEN, AND PELVIS WITH CONTRAST TECHNIQUE: Multidetector CT imaging of the chest, abdomen and pelvis was performed following the standard  protocol during bolus administration of intravenous contrast. CONTRAST:  113m OMNIPAQUE IOHEXOL 300 MG/ML  SOLN COMPARISON:  CT chest 04/27/2018, PET 12/24/2017 is CT abdomen pelvis 04/13/2017. FINDINGS: CT CHEST FINDINGS Cardiovascular: Right IJ Port-A-Cath terminates in the high right atrium. Atherosclerotic calcification of the aorta and coronary arteries. Heart size normal. No pericardial effusion. Mediastinum/Nodes: No pathologically enlarged mediastinal, hilar or axillary lymph nodes. Mild mid esophageal wall thickening, as before. Lungs/Pleura: Centrilobular and paraseptal emphysema. Mild patchy consolidation, bronchiectasis and ground-glass in the medial left hemithorax are new from 04/27/2018. Lungs are otherwise clear. No pleural fluid. Debris is seen in the mainstem bronchi, bronchus intermedius and left upper lobe bronchus. Musculoskeletal: Mild degenerative changes in the spine. No worrisome lytic or sclerotic lesions. CT ABDOMEN PELVIS FINDINGS Hepatobiliary: Liver and gallbladder are unremarkable. No biliary ductal dilatation. Pancreas: Negative. Spleen: Negative. Adrenals/Urinary Tract: Slight nodular thickening of both adrenal glands. Subcentimeter low-attenuation lesion in the lower pole left kidney is too small to characterize but statistically, a cyst is likely. Kidneys are otherwise unremarkable. Ureters are decompressed. Bladder is low in volume with a cystocele. Stomach/Bowel: Stomach, small bowel, appendix and colon are unremarkable. Vascular/Lymphatic: Atherosclerotic calcification of the aorta without aneurysm. IVC filter is in place. No pathologically enlarged lymph nodes. Reproductive: Hysterectomy.  No adnexal mass.  Pelvic floor laxity. Other: No free fluid.  Mesenteries and peritoneum are unremarkable. Musculoskeletal: No worrisome lytic or sclerotic lesions. IMPRESSION: 1. Patchy areas of consolidation,  bronchiectasis and ground-glass in the medial left hemithorax, presumably  treatment related. No evidence of metastatic disease. 2. Extensive debris in the mainstem bronchi, bronchus intermedius and left upper lobe bronchus. 3. Aortic atherosclerosis (ICD10-170.0). Coronary artery calcification. 4.  Emphysema (ICD10-J43.9). Electronically Signed   By: Lorin Picket M.D.   On: 01/26/2019 12:03   Ct Abdomen Pelvis W Contrast  Result Date: 01/26/2019 CLINICAL DATA:  Lung cancer treated with chemo radiation and immunotherapy. EXAM: CT CHEST, ABDOMEN, AND PELVIS WITH CONTRAST TECHNIQUE: Multidetector CT imaging of the chest, abdomen and pelvis was performed following the standard protocol during bolus administration of intravenous contrast. CONTRAST:  117m OMNIPAQUE IOHEXOL 300 MG/ML  SOLN COMPARISON:  CT chest 04/27/2018, PET 12/24/2017 is CT abdomen pelvis 04/13/2017. FINDINGS: CT CHEST FINDINGS Cardiovascular: Right IJ Port-A-Cath terminates in the high right atrium. Atherosclerotic calcification of the aorta and coronary arteries. Heart size normal. No pericardial effusion. Mediastinum/Nodes: No pathologically enlarged mediastinal, hilar or axillary lymph nodes. Mild mid esophageal wall thickening, as before. Lungs/Pleura: Centrilobular and paraseptal emphysema. Mild patchy consolidation, bronchiectasis and ground-glass in the medial left hemithorax are new from 04/27/2018. Lungs are otherwise clear. No pleural fluid. Debris is seen in the mainstem bronchi, bronchus intermedius and left upper lobe bronchus. Musculoskeletal: Mild degenerative changes in the spine. No worrisome lytic or sclerotic lesions. CT ABDOMEN PELVIS FINDINGS Hepatobiliary: Liver and gallbladder are unremarkable. No biliary ductal dilatation. Pancreas: Negative. Spleen: Negative. Adrenals/Urinary Tract: Slight nodular thickening of both adrenal glands. Subcentimeter low-attenuation lesion in the lower pole left kidney is too small to characterize but statistically, a cyst is likely. Kidneys are otherwise  unremarkable. Ureters are decompressed. Bladder is low in volume with a cystocele. Stomach/Bowel: Stomach, small bowel, appendix and colon are unremarkable. Vascular/Lymphatic: Atherosclerotic calcification of the aorta without aneurysm. IVC filter is in place. No pathologically enlarged lymph nodes. Reproductive: Hysterectomy.  No adnexal mass.  Pelvic floor laxity. Other: No free fluid.  Mesenteries and peritoneum are unremarkable. Musculoskeletal: No worrisome lytic or sclerotic lesions. IMPRESSION: 1. Patchy areas of consolidation, bronchiectasis and ground-glass in the medial left hemithorax, presumably treatment related. No evidence of metastatic disease. 2. Extensive debris in the mainstem bronchi, bronchus intermedius and left upper lobe bronchus. 3. Aortic atherosclerosis (ICD10-170.0). Coronary artery calcification. 4.  Emphysema (ICD10-J43.9). Electronically Signed   By: MLorin PicketM.D.   On: 01/26/2019 12:03   MRI brain 04/21/2017: IMPRESSION: 1. Motion degraded examination. 2. 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 3. Otherwise negative MRI of the head with and without contrast for age.  Electronically Signed   By: CElon AlasM.D.   On: 04/22/2017 00:34    ASSESSMENT & PLAN:   63y.o. female with  1.  Non-Small Cell Carcinoma atleast Stage IIIB  Not enough tissue for further characterization or mutation testing. Presenting with mediastinal nodal mass - TxN2 Mx (Atleast Stage III disease) ? Lingular primary vs inflammation. No overt evidence of metastatic disease.  -EGFR mutation studies on blood negative. Patient is s/p 3 cycles of Carboplatin/taxol -resolution left upper lobe ground-glass opacity has resolved in the interval. 08/19/17 PET revealed  No significant change in size of hypermetabolic AP window mass. The degree of FDG uptake is slightly increased in the interval. 2. Persistent hypermetabolic focus  involving the distal aspect of the tongue within SUV max of 16.46. Similar to previous exam. (no clinical co-relate). Lung involvement has decreased.   08/19/17 Brain MRI revealed No evidence of intracranial metastases. Interval resolution  of bilateral parietal signal abnormality favoring PRES  Completed concurrent Carbo/taxol chemotherapy + RT  12/24/17 PET/CT revealed Marked interval response to therapy with large AP window lymph nodes seen on the previous study almost imperceptible on today's exam. Hypermetabolism within this lesion has resolved with FDG accumulation now at background blood pool levels. 2. Small focus of hypermetabolism in the anal region, similar to prior and indeterminate by PET imaging. 3. Hypermetabolic focus at the distal tongue has decreased in the Interval. 4. Marked pelvic floor laxity with cystocele. 5. Stable tiny left lung nodules. 6.  Aortic Atherosclerois. 7.  Emphysema.   04/03/17 endoscopy did not reveal esophageal thickening    04/27/18 CT Chest revealed Stable exam. No new or progressive findings. 2. Stable mild circumferential wall thickening mid esophagus. Esophagitis is a consideration. 3. Stable tiny left pulmonary nodules. 4.  Emphysema. 5.  Aortic Atherosclerois.  2. Bone Pain from neulasta - managed with Tylenol  3. Previous h/ot SZ -currently on Keppra. MRI brain - resolution of findings of PRES  4. H/o rt calf DVT - off anticoagulation due to massive GI bleeding. S/p IVC filter. No issues currently  5. S/p previous GI bleeding from Gastric ulcer --needing clipping and arterial embolization. On PPI BID Ulcers seen to be healed with recent April 2020 repeat endoscopy  6. H/o ctx /steroid related thrush --now resolved.  8. Elevated TSH/Hypothyroidism -- ? Related to immunotherapy 07/13/18 Thyroid functions revealed TSH at 62.91 and Free T4 at 0.30 Began low dose 3mg Levothyroxine and provide endocrinology referral for further optimization. Recommend  taking Levothyroxine 1.5 hours before to breakfast with only water and no other medications.  F/u with Dr. SKelton Pill[facility]   PLAN:  -Discussed pt labwork today, 02/10/19;all values are WNL except for Glucose Bld at 128, BUN at 29, Creatinine at 1.12, Total Bilirubin at 0.2, GFR Est Non Af Am at 52, GFR Est AFR Am at 60, RBC at 3.56, Hemoglobin at 10.9, HCT 34., TSH-0.448 -Discussed CT Abdomen Pelvis W Contrast (Accession [id number] completed on 01/26/2019 with results revealing "1. Patchy areas of consolidation, bronchiectasis and ground-glass in the medial left hemithorax, presumably treatment related. No evidence of metastatic disease.2. Extensive debris in the mainstem bronchi, bronchus intermedius and left upper lobe bronchus.."   -Discussed taking antihistamine OTC for rashes and prescribing lotrisone cream. If rash does not improve follow up with a dermatologist.  -Discussed blood counts are stable -Discussed blood chemistries are steady, advised pt to increase water intake. -Discussed to continue to monitor  FOLLOW UP: RTC with Dr KIrene Limbowith labs in 3 months  The total time spent in the appt was 25 minutes and more than 50% was on counseling and direct patient cares.  All of the patient's questions were answered with apparent satisfaction. The patient knows to call the clinic with any problems, questions or concerns.  GSullivan LoneMD MHarvestAAHIVMS SCar[facility]oscopy CenterincCProvidence Little Company Of Mary Transitional Care CenterHematology/Oncology Physician CMidatlantic [facility] (Office):       [phone](Work cell):  [phone](Fax):           [phone] 02/10/2019 7:08 AM  I, AScot Dock am acting as a scribe for Dr. GSullivan Lone   .I have reviewed the above documentation for accuracy and completeness, and I agree with the above. .Brunetta GeneraMD

## 2019-02-11 ENCOUNTER — Telehealth: Payer: Self-pay | Admitting: Hematology

## 2019-02-11 NOTE — Telephone Encounter (Signed)
Scheduled per os. Called and left msg. Mailed printout

## 2019-02-18 DIAGNOSIS — L309 Dermatitis, unspecified: Secondary | ICD-10-CM | POA: Diagnosis not present

## 2019-03-18 DIAGNOSIS — L400 Psoriasis vulgaris: Secondary | ICD-10-CM | POA: Diagnosis not present

## 2019-03-21 NOTE — Progress Notes (Signed)
Virtual Visit via Video Note changed to phone visit at patient request.   This visit type was conducted due to national recommendations for restrictions regarding the COVID-19 Pandemic (e.g. social distancing) in an effort to limit this patient's exposure and mitigate transmission in our community.  Due to her co-morbid illnesses, this patient is at least at moderate risk for complications without adequate follow up.  This format is felt to be most appropriate for this patient at this time.  All issues noted in this document were discussed and addressed.  A limited physical exam was performed with this format.  Please refer to the patient's chart for her consent to telehealth for [facility].   Date:  03/22/2019   ID:  Kayla Wyatt, DOB 1955-09-21, MRN [id number]  Patient Location:Home Provider Location: Home  PCP:  [name], MD  Cardiologist:  Dr Stanford Breed  Evaluation Performed:  Follow-Up Visit  Chief Complaint:  FU atrial fibrillation  History of Present Illness:    Follow-up paroxysmal atrial fibrillation. Patient admitted with GI bleed January 2019. EGD showed gastric ulcer which was clipped. She developed atrial fibrillation with rapid ventricular response which was treated with amiodarone. Echocardiogram [id number] showed ejection fraction 55 to 60% and grade 1diastolic dysfunction. She also was found to have a new mass on chest CT and has subsequently been diagnosed with nonsmall cell lung cancer. Finally she was noted to have gastrocnemius DVT. IVC filter was placed.  Note we have not anticoagulated as her episode of atrial fibrillation occurred in the setting of acute illness and she also has lung cancer and has had occasional hemoptysis. Venous Dopplers September 2020 showed left DVT since last seen,  patient denies dyspnea, chest pain, palpitations or syncope.  She has completed her chemotherapy for lung cancer and has had no further hemoptysis.  She is now on  apixaban for DVT.  The patient does not have symptoms concerning for COVID-19 infection (fever, chills, cough, or new shortness of breath).    Past Medical History:  Diagnosis Date  . Acute blood loss anemia 04/02/2017  . Anxiety   . Gastrointestinal hemorrhage with melena   . Headache   . Hypercholesteremia   . Hypertension   . lung ca dx'd 05/2017  . Persistent atrial fibrillation with rapid ventricular response ([place]) 04/07/2017  . Seizures ([place])    04/2017 while in hospital at [facility]  . Vitamin D deficiency    Past Surgical History:  Procedure Laterality Date  . COLONOSCOPY Left 04/09/2017   Procedure: COLONOSCOPY;  Surgeon: Carol Ada, MD;  Location: [place];  Service: Endoscopy;  Laterality: Left;  . ESOPHAGOGASTRODUODENOSCOPY N/A 04/03/2017   Procedure: ESOPHAGOGASTRODUODENOSCOPY (EGD);  Surgeon: Carol Ada, MD;  Location: [place];  Service: Endoscopy;  Laterality: N/A;  . ESOPHAGOGASTRODUODENOSCOPY (EGD) WITH PROPOFOL N/A 04/12/2017   Procedure: ESOPHAGOGASTRODUODENOSCOPY (EGD) WITH PROPOFOL;  Surgeon: Milus Banister, MD;  Location: [facility] ENDOSCOPY;  Service: Endoscopy;  Laterality: N/A;  . IR ANGIOGRAM SELECTIVE EACH ADDITIONAL VESSEL  04/12/2017  . IR ANGIOGRAM SELECTIVE EACH ADDITIONAL VESSEL  04/13/2017  . IR ANGIOGRAM SELECTIVE EACH ADDITIONAL VESSEL  04/13/2017  . IR ANGIOGRAM SELECTIVE EACH ADDITIONAL VESSEL  04/13/2017  . IR ANGIOGRAM VISCERAL SELECTIVE  04/12/2017  . IR ANGIOGRAM VISCERAL SELECTIVE  04/13/2017  . IR EMBO ART  VEN HEMORR LYMPH EXTRAV  INC GUIDE ROADMAPPING  04/12/2017  . IR EMBO ART  VEN HEMORR LYMPH EXTRAV  INC GUIDE ROADMAPPING  04/13/2017  . IR FLUORO GUIDE CV LINE  RIGHT  04/13/2017  . IR FLUORO GUIDE PORT INSERTION RIGHT  07/27/2017  . IR IVC FILTER PLMT / S&I /IMG GUID/MOD SED  04/12/2017  . IR US GUIDE VASC ACCESS RIGHT  04/12/2017  . IR US GUIDE VASC ACCESS RIGHT  04/13/2017  . IR US GUIDE VASC ACCESS RIGHT  04/13/2017  . IR US GUIDE VASC ACCESS RIGHT   07/27/2017  . LEG SURGERY  1974   Blood Clot Removal   . VIDEO BRONCHOSCOPY WITH ENDOBRONCHIAL ULTRASOUND N/A 05/19/2017   Procedure: VIDEO BRONCHOSCOPY WITH ENDOBRONCHIAL ULTRASOUND;  Surgeon: Grace Isaac, MD;  Location: [place];  Service: Thoracic;  Laterality: N/A;     Current Meds  Medication Sig  . acetaminophen (TYLENOL) 325 MG tablet Take 1-2 tablets (325-650 mg total) by mouth every 4 (four) hours as needed for mild pain.  [name] apixaban (ELIQUIS) 5 MG TABS tablet Take 1 tablet (5 mg total) by mouth daily.  . budesonide-formoterol (SYMBICORT) 80-4.5 MCG/ACT inhaler Inhale 2 puffs into the lungs 2 (two) times daily.  . cholecalciferol (VITAMIN D) 1000 units tablet Take 1,000 Units by mouth 2 (two) times daily.  . clotrimazole-betamethasone (LOTRISONE) cream Apply 1 application topically 2 (two) times daily. To rash on upper and lower extremities  . levETIRAcetam (KEPPRA) 500 MG tablet TK 1 T PO BID  . levothyroxine (SYNTHROID) 88 MCG tablet Take 1 tablet (88 mcg total) by mouth daily.  [name] lidocaine-prilocaine (EMLA) cream Apply to affected area once  . lisinopril-hydrochlorothiazide (PRINZIDE,ZESTORETIC) 20-12.5 MG tablet Take 1 tablet by mouth daily.  [name] LORazepam (ATIVAN) 0.5 MG tablet Take 2 tablets (1 mg total) by mouth once as needed for up to 1 dose (prior to PET/CT and MRI brain for anxiety).  . ondansetron (ZOFRAN) 8 MG tablet Take 1 tablet (8 mg total) by mouth 2 (two) times daily as needed for refractory nausea / vomiting. Start on day 3 after chemo.  . pantoprazole (PROTONIX) 40 MG tablet TK 1 T PO BID  . polycarbophil (FIBERCON) 625 MG tablet Take 2 tablets (1,250 mg total) by mouth 2 (two) times daily.  [name] saccharomyces boulardii (FLORASTOR) 250 MG capsule Take 250 mg by mouth 2 (two) times daily.     Allergies:   Patient has no known allergies.   Social History   Tobacco Use  . Smoking status: Former Smoker    Packs/day: 0.50    Years: 40.00    Pack years: 20.00     Types: Cigarettes    Quit date: 03/31/2017    Years since quitting: 1.9  . Smokeless tobacco: Never Used  Substance Use Topics  . Alcohol use: No  . Drug use: No     Family Hx: The patient's family history includes Heart disease in her father.  ROS:   Please see the history of present illness.    No Fever, chills  or productive cough All other systems reviewed and are negative.   Recent Labs: 02/10/2019: ALT 15; BUN 29; Creatinine 1.12; Hemoglobin 10.9; Platelets 247; Potassium 3.9; Sodium 145; TSH 0.448    Wt Readings from Last 3 Encounters:  03/22/19 153 lb (69.4 kg)  02/10/19 159 lb (72.1 kg)  12/15/18 163 lb 4.8 oz (74.1 kg)     Objective:    Vital Signs:  Ht 5\' 3"  (1.6 m)   Wt 153 lb (69.4 kg)   BMI 27.10 kg/m    VITAL SIGNS:  reviewed NAD Answers questions appropriately Normal affect Remainder of physical examination not performed (  telehealth visit; coronavirus pandemic)  ASSESSMENT & PLAN:    1. Paroxysmal atrial fibrillation-previously occurred in the setting of GI bleed and EGD.  No recurrences that have been documented.  Anticoagulation was held previously as we felt this was likely in the setting of acute illness.  She also hemoptysis related to her lung cancer.  Her hemoptysis has resolved and she is now on apixaban for recent DVT. 2. Hypertension- Continue present medical regimen. 3. Hyperlipidemia-Per primary care. 4. History of DVT-IVC filter in place.  She is now on apixaban for her DVT as well. 5. Non-small cell lung cancer  COVID-19 Education: The importance of social distancing was discussed today.  Time:   Today, I have spent 13 minutes with the patient with telehealth technology discussing the above problems.     Medication Adjustments/Labs and Tests Ordered: Current medicines are reviewed at length with the patient today.  Concerns regarding medicines are outlined above.   Tests Ordered: No orders of the defined types were placed in this  encounter.   Medication Changes: No orders of the defined types were placed in this encounter.   Follow Up:  Either In Person or Virtual in 1 year(s)  Signed, Kirk Ruths, MD  03/22/2019 7:58 AM    [place]

## 2019-03-22 ENCOUNTER — Telehealth (INDEPENDENT_AMBULATORY_CARE_PROVIDER_SITE_OTHER): Payer: PPO | Admitting: Cardiology

## 2019-03-22 ENCOUNTER — Encounter: Payer: Self-pay | Admitting: Cardiology

## 2019-03-22 VITALS — Ht 63.0 in | Wt 153.0 lb

## 2019-03-22 DIAGNOSIS — E7800 Pure hypercholesterolemia, unspecified: Secondary | ICD-10-CM

## 2019-03-22 DIAGNOSIS — I480 Paroxysmal atrial fibrillation: Secondary | ICD-10-CM

## 2019-03-22 DIAGNOSIS — I10 Essential (primary) hypertension: Secondary | ICD-10-CM | POA: Diagnosis not present

## 2019-03-22 NOTE — Patient Instructions (Signed)
Medication Instructions:  NO CHANGE *If you need a refill on your cardiac medications before your next appointment, please call your pharmacy*  Lab Work: If you have labs (blood work) drawn today and your tests are completely normal, you will receive your results only by: . MyChart Message (if you have MyChart) OR . A paper copy in the mail If you have any lab test that is abnormal or we need to change your treatment, we will call you to review the results.  Follow-Up: At [facility], you and your health needs are our priority.  As part of our continuing mission to provide you with exceptional heart care, we have created designated Provider Care Teams.  These Care Teams include your primary Cardiologist (physician) and Advanced Practice Providers (APPs -  Physician Assistants and Nurse Practitioners) who all work together to provide you with the care you need, when you need it.  Your next appointment:   12 month(s)  The format for your next appointment:   Either In Person or Virtual  Provider:   You may see BRIAN CRENSHAW MD or one of the following Advanced Practice Providers on your designated Care Team:    Luke Kilroy, PA-C  Callie Goodrich, PA-C  Jesse Cleaver, FNP    

## 2019-04-15 DIAGNOSIS — L400 Psoriasis vulgaris: Secondary | ICD-10-CM | POA: Diagnosis not present

## 2019-05-08 DIAGNOSIS — C3490 Malignant neoplasm of unspecified part of unspecified bronchus or lung: Secondary | ICD-10-CM | POA: Diagnosis not present

## 2019-05-08 DIAGNOSIS — J438 Other emphysema: Secondary | ICD-10-CM | POA: Diagnosis not present

## 2019-05-08 DIAGNOSIS — I10 Essential (primary) hypertension: Secondary | ICD-10-CM | POA: Diagnosis not present

## 2019-05-08 DIAGNOSIS — E7800 Pure hypercholesterolemia, unspecified: Secondary | ICD-10-CM | POA: Diagnosis not present

## 2019-05-11 ENCOUNTER — Inpatient Hospital Stay: Payer: PPO

## 2019-05-11 ENCOUNTER — Other Ambulatory Visit: Payer: Self-pay

## 2019-05-11 ENCOUNTER — Other Ambulatory Visit: Payer: Self-pay | Admitting: Internal Medicine

## 2019-05-11 ENCOUNTER — Inpatient Hospital Stay: Payer: PPO | Attending: Hematology | Admitting: Hematology

## 2019-05-11 VITALS — BP 116/60 | HR 93 | Temp 98.0°F | Resp 18 | Ht 63.0 in | Wt 166.9 lb

## 2019-05-11 DIAGNOSIS — Z86718 Personal history of other venous thrombosis and embolism: Secondary | ICD-10-CM | POA: Insufficient documentation

## 2019-05-11 DIAGNOSIS — Z5111 Encounter for antineoplastic chemotherapy: Secondary | ICD-10-CM

## 2019-05-11 DIAGNOSIS — Z87891 Personal history of nicotine dependence: Secondary | ICD-10-CM | POA: Insufficient documentation

## 2019-05-11 DIAGNOSIS — C3490 Malignant neoplasm of unspecified part of unspecified bronchus or lung: Secondary | ICD-10-CM | POA: Diagnosis not present

## 2019-05-11 DIAGNOSIS — M898X9 Other specified disorders of bone, unspecified site: Secondary | ICD-10-CM | POA: Diagnosis not present

## 2019-05-11 DIAGNOSIS — E039 Hypothyroidism, unspecified: Secondary | ICD-10-CM | POA: Insufficient documentation

## 2019-05-11 DIAGNOSIS — Z95828 Presence of other vascular implants and grafts: Secondary | ICD-10-CM

## 2019-05-11 DIAGNOSIS — L409 Psoriasis, unspecified: Secondary | ICD-10-CM | POA: Insufficient documentation

## 2019-05-11 DIAGNOSIS — E032 Hypothyroidism due to medicaments and other exogenous substances: Secondary | ICD-10-CM

## 2019-05-11 LAB — CBC WITH DIFFERENTIAL/PLATELET
Abs Immature Granulocytes: 0.04 10*3/uL (ref 0.00–0.07)
Basophils Absolute: 0.1 10*3/uL (ref 0.0–0.1)
Basophils Relative: 1 %
Eosinophils Absolute: 0.2 10*3/uL (ref 0.0–0.5)
Eosinophils Relative: 4 %
HCT: 34.5 % — ABNORMAL LOW (ref 36.0–46.0)
Hemoglobin: 11.2 g/dL — ABNORMAL LOW (ref 12.0–15.0)
Immature Granulocytes: 1 %
Lymphocytes Relative: 22 %
Lymphs Abs: 1.4 10*3/uL (ref 0.7–4.0)
MCH: 30.7 pg (ref 26.0–34.0)
MCHC: 32.5 g/dL (ref 30.0–36.0)
MCV: 94.5 fL (ref 80.0–100.0)
Monocytes Absolute: 0.5 10*3/uL (ref 0.1–1.0)
Monocytes Relative: 8 %
Neutro Abs: 4.2 10*3/uL (ref 1.7–7.7)
Neutrophils Relative %: 64 %
Platelets: 292 10*3/uL (ref 150–400)
RBC: 3.65 MIL/uL — ABNORMAL LOW (ref 3.87–5.11)
RDW: 14.6 % (ref 11.5–15.5)
WBC: 6.4 10*3/uL (ref 4.0–10.5)
nRBC: 0 % (ref 0.0–0.2)

## 2019-05-11 LAB — CMP (CANCER CENTER ONLY)
ALT: 12 U/L (ref 0–44)
AST: 23 U/L (ref 15–41)
Albumin: 3.8 g/dL (ref 3.5–5.0)
Alkaline Phosphatase: 64 U/L (ref 38–126)
Anion gap: 11 (ref 5–15)
BUN: 25 mg/dL — ABNORMAL HIGH (ref 8–23)
CO2: 27 mmol/L (ref 22–32)
Calcium: 10.1 mg/dL (ref 8.9–10.3)
Chloride: 107 mmol/L (ref 98–111)
Creatinine: 1.25 mg/dL — ABNORMAL HIGH (ref 0.44–1.00)
GFR, Est AFR Am: 52 mL/min — ABNORMAL LOW (ref >60)
GFR, Estimated: 45 mL/min — ABNORMAL LOW (ref >60)
Glucose, Bld: 84 mg/dL (ref 70–99)
Potassium: 4.3 mmol/L (ref 3.5–5.1)
Sodium: 145 mmol/L (ref 135–145)
Total Bilirubin: 0.2 mg/dL — ABNORMAL LOW (ref 0.3–1.2)
Total Protein: 7.1 g/dL (ref 6.5–8.1)

## 2019-05-11 LAB — TSH: TSH: 117.087 u[IU]/mL — ABNORMAL HIGH (ref 0.308–3.960)

## 2019-05-11 NOTE — Progress Notes (Signed)
[name]    HEMATOLOGY/ONCOLOGY CLINIC NOTE  Date of Service: 05/11/2019   Patient Care Team: [name], MD as PCP - General (Family Medicine)  CHIEF COMPLAINTS/PURPOSE OF CONSULTATION:   F/u for continued management of Non-Small Cell lung Carcinoma  HISTORY OF PRESENTING ILLNESS:   Kayla Wyatt is a wonderful 64 y.o. female who has been referred to [place] by Dr [name] for evaluation and management of Non-Small Cell Carcinoma. She is accompanied today by her husband. The pt reports that she is doing well overall.   Patient recently had a significant hospitalization on 04/02/2017 - as per DC summary by [name] " with abdominal pain, hematemesis, melena and ABLA due to GIB.  She underwent clipping of gastric ulcer and received multiple units PRBC but continued to have drop in H/H with bloody stool. NM GI scan was negative for bleeding and she underwent colonoscopy by Dr. [name] 1/31 showing diverticulosis but no signs of bleeding. Hospital course significant for R- gastrocnemius DVT, PAF, incidental findings of mediastinal mass as well as brief episode of hematuria. She was cleared to start on Xarelto on 02/01 for treatment of DVT. On 2/2 am she developed hematemesis with maroon stools and hypotension requiring fluid bolus as well as 2 units PRBCs. She continued to decline requiring intubation, pressors as well as reversal of anticoagulation with Kcentra on 02/3. She underwent UGI revealing large clot with fresh blood in stomach and underwent mesenteric arteriogram with percutaneous coil embolization fo distal tributary of left gastric artery and placement of IVC filter by interventional radiology.   A fib with RVR felt to be due to hemorrhagic shock and she converted to NSR. [name] felt no further cardiac work up indicated and did not recommend initiating anticoagulation in short term. To follow up with [name] after PET scan and routine biopsy after medically stable. She  continued to have bleeding and underwent visceral angiogram with embolization of inferior division of splenic artery and associated gastric arteries on 2/4. No surgical intervention needed per [name] with recommendations to continue monitoring H/H as well as for recurrence of hematochezia. She tolerated extubation on 2/6 and respiratory status improving.  On 2/11, she has episode of unresponsiveness with jerking of bilateral limbs that lasted about 3 minutes. She had amnesia of events but was back to baseline. EEG revealed "focal slowing over the right temporo-occipital regionandoccasional epileptiform discharges over the right occipital region". Head CT reviewed, unremarkable for acute intracranial process. MRI brain done revealing mild biparietal signal abnormality suspicious for PRES. [name] recommended starting patient on Keppra BID with repeat MRI in 2 weeks. New onset seizure likely provoked by PRES--if resolved--Ok to take patient off Keppra. Patient with resultant generalized weakness. CIR recommended due to functional deficits. "  Later as outpatient she had a PET scan on 05/14/17 which showed a hypermetabolic mediastinal mass which was subsequently biopsied with a bronchoscopy on 05/19/17. She notes that she had smoked for about 35-40 years, half a pack of a day. She reports that she has not had any other symptoms related to her smoking; she has never had to be on home oxygen or inhalers. She has recently begun Symbicort. While being in the hospital she lost about 20 lbs but has maintained her weight since being discharged.  The pt and her husband note that she is concerned about what to do if she is not able to go back to work after her 90-day leave. We recommended that they speak with  our social workers here to explore the available options for insurance coverage /disability application etc.  Of note prior to the patient's visit today, pt has had PET/CT completed on 05/14/17 with  results revealing 1. The known mediastinal mass is intensely hypermetabolic, worrisome for malignancy. This could reflect lymphoma or small cell lung cancer. Tissue sampling recommended.2. No peripheral lung mass identified. New patchy ground-glass opacity in the lingula is mildly hypermetabolic and likely inflammatory.  On 05/19/17 the pt had a Fine Needle Aspiration which revealed Non-Small cell lung carcinoma.  Most recent lab results (05/19/17) of CBC  is as follows: all values are WNL except for RBC at 2.90, Hgb at 8.6, HCT at 28.0, RDW at 16.4.  On review of systems, pt reports knee pain, and denies abdominal pains, leg swelling, SOB, CP, and any other symptoms.   On PMHx the pt reports acute blood loss anemia, GI hemorrhage with melena, hypercholesteremia, HTN, persistent atrial fibrillation with rapid ventricular response, Vitamin D deficiency, denies liver and kidney problems. On Social Hx the pt reports having smoked for 35-40 years of half a pack per day. She quit smoking on 03/31/17.  On Family Hx the pt reports that her father had heart disease.  Interval History:   Kayla Wyatt returns today regarding her Non-Small Cell Carcinoma. The patient's last visit with [place] was on 02/10/2019. The pt reports that she is doing well overall.  The pt reports that she has been feeling well except for her contined psoriasis flare-up. She has a rash on her arms but it is worse on her legs. She is currently using triamcinolone cream to treat. Pt has continued her smoking cessation and denies vaping. She is able to walk as much as she wants on level ground and denies any SOB. Pt has been off of her Synthroid for nearly a week but is in the process of refilling her prescription.   Lab results today (05/11/19) of CBC w/diff and CMP is as follows: all values are WNL except for RBC at 3.65, Hgb at 11.2, HCT at 34.5, BUN at 25, Creatinine at 1.25, Total Bilirubin at 0.2, GFR Est Non Af Am at 45. 05/11/2019  TSH at 117.087  On review of systems, pt reports skin rash and denies fevers, chills, changes in breathing, diarrhea, unexpected weight loss, cough, SOB, abdominal pain, difficulty urinating, constipation and any other symptoms.   MEDICAL HISTORY:  Past Medical History:  Diagnosis Date  . Acute blood loss anemia 04/02/2017  . Anxiety   . Gastrointestinal hemorrhage with melena   . Headache   . Hypercholesteremia   . Hypertension   . lung ca dx'd 05/2017  . Persistent atrial fibrillation with rapid ventricular response ([place]) 04/07/2017  . Seizures ([place])    04/2017 while in hospital at [facility]  . Vitamin D deficiency     SURGICAL HISTORY: Past Surgical History:  Procedure Laterality Date  . COLONOSCOPY Left 04/09/2017   Procedure: COLONOSCOPY;  Surgeon: Carol Ada, MD;  Location: [place];  Service: Endoscopy;  Laterality: Left;  . ESOPHAGOGASTRODUODENOSCOPY N/A 04/03/2017   Procedure: ESOPHAGOGASTRODUODENOSCOPY (EGD);  Surgeon: Carol Ada, MD;  Location: [place];  Service: Endoscopy;  Laterality: N/A;  . ESOPHAGOGASTRODUODENOSCOPY (EGD) WITH PROPOFOL N/A 04/12/2017   Procedure: ESOPHAGOGASTRODUODENOSCOPY (EGD) WITH PROPOFOL;  Surgeon: Milus Banister, MD;  Location[facility] ENDOSCOPY;  Service: Endoscopy;  Laterality: N/A;  . IR ANGIOGRAM SELECTIVE EACH ADDITIONAL VESSEL  04/12/2017  . IR ANGIOGRAM SELECTIVE EACH ADDITIONAL VESSEL  04/13/2017  .  IR ANGIOGRAM SELECTIVE EACH ADDITIONAL VESSEL  04/13/2017  . IR ANGIOGRAM SELECTIVE EACH ADDITIONAL VESSEL  04/13/2017  . IR ANGIOGRAM VISCERAL SELECTIVE  04/12/2017  . IR ANGIOGRAM VISCERAL SELECTIVE  04/13/2017  . IR EMBO ART  VEN HEMORR LYMPH EXTRAV  INC GUIDE ROADMAPPING  04/12/2017  . IR EMBO ART  VEN HEMORR LYMPH EXTRAV  INC GUIDE ROADMAPPING  04/13/2017  . IR FLUORO GUIDE CV LINE RIGHT  04/13/2017  . IR FLUORO GUIDE PORT INSERTION RIGHT  07/27/2017  . IR IVC FILTER PLMT / S&I /IMG GUID/MOD SED  04/12/2017  . IR US GUIDE VASC ACCESS RIGHT  04/12/2017    . IR US GUIDE VASC ACCESS RIGHT  04/13/2017  . IR US GUIDE VASC ACCESS RIGHT  04/13/2017  . IR US GUIDE VASC ACCESS RIGHT  07/27/2017  . LEG SURGERY  1974   Blood Clot Removal   . VIDEO BRONCHOSCOPY WITH ENDOBRONCHIAL ULTRASOUND N/A 05/19/2017   Procedure: VIDEO BRONCHOSCOPY WITH ENDOBRONCHIAL ULTRASOUND;  Surgeon: Grace Isaac, MD;  Location: [place];  Service: Thoracic;  Laterality: N/A;    SOCIAL HISTORY: Social History   Socioeconomic History  . Marital status: Single    Spouse name: Not on file  . Number of children: Not on file  . Years of education: Not on file  . Highest education level: Not on file  Occupational History  . Occupation: Has to lift heavy boxes at times.    Employer: [employer]  Tobacco Use  . Smoking status: Former Smoker    Packs/day: 0.50    Years: 40.00    Pack years: 20.00    Types: Cigarettes    Quit date: 03/31/2017    Years since quitting: 2.1  . Smokeless tobacco: Never Used  Substance and Sexual Activity  . Alcohol use: No  . Drug use: No  . Sexual activity: Not Currently  Other Topics Concern  . Not on file  Social History Narrative  . Not on file   Social Determinants of Health   Financial Resource Strain:   . Difficulty of Paying Living Expenses: Not on file  Food Insecurity:   . Worried About Charity fundraiser in the Last Year: Not on file  . Ran Out of Food in the Last Year: Not on file  Transportation Needs:   . Lack of Transportation (Medical): Not on file  . Lack of Transportation (Non-Medical): Not on file  Physical Activity:   . Days of Exercise per Week: Not on file  . Minutes of Exercise per Session: Not on file  Stress:   . Feeling of Stress : Not on file  Social Connections:   . Frequency of Communication with Friends and Family: Not on file  . Frequency of Social Gatherings with Friends and Family: Not on file  . Attends Religious Services: Not on file  . Active Member of Clubs or Organizations: Not on file  .  Attends Archivist Meetings: Not on file  . Marital Status: Not on file  Intimate Partner Violence:   . Fear of Current or Ex-Partner: Not on file  . Emotionally Abused: Not on file  . Physically Abused: Not on file  . Sexually Abused: Not on file    FAMILY HISTORY: Family History  Problem Relation Age of Onset  . Heart disease Father     ALLERGIES:  has No Known Allergies.  MEDICATIONS:  Current Outpatient Medications  Medication Sig Dispense Refill  . acetaminophen (TYLENOL) 325 MG  tablet Take 1-2 tablets (325-650 mg total) by mouth every 4 (four) hours as needed for mild pain.    [name] apixaban (ELIQUIS) 5 MG TABS tablet Take 1 tablet (5 mg total) by mouth daily. 30 tablet 1  . budesonide-formoterol (SYMBICORT) 80-4.5 MCG/ACT inhaler Inhale 2 puffs into the lungs 2 (two) times daily. 1 Inhaler 12  . cholecalciferol (VITAMIN D) 1000 units tablet Take 1,000 Units by mouth 2 (two) times daily.    . clotrimazole-betamethasone (LOTRISONE) cream Apply 1 application topically 2 (two) times daily. To rash on upper and lower extremities 45 g 0  . levETIRAcetam (KEPPRA) 500 MG tablet TK 1 T PO BID  6  . levothyroxine (SYNTHROID) 88 MCG tablet Take 1 tablet (88 mcg total) by mouth daily. 60 tablet 3  . lidocaine-prilocaine (EMLA) cream Apply to affected area once 30 g 3  . lisinopril-hydrochlorothiazide (PRINZIDE,ZESTORETIC) 20-12.5 MG tablet Take 1 tablet by mouth daily.    [name] LORazepam (ATIVAN) 0.5 MG tablet Take 2 tablets (1 mg total) by mouth once as needed for up to 1 dose (prior to PET/CT and MRI brain for anxiety). 6 tablet 0  . pantoprazole (PROTONIX) 40 MG tablet TK 1 T PO BID  6  . polycarbophil (FIBERCON) 625 MG tablet Take 2 tablets (1,250 mg total) by mouth 2 (two) times daily. 120 tablet 0  . saccharomyces boulardii (FLORASTOR) 250 MG capsule Take 250 mg by mouth 2 (two) times daily.    [name] triamcinolone cream (KENALOG) 0.1 % APPLY THIN LAYER TO THE AFFECTED AREA TWICE DAILY     . ondansetron (ZOFRAN) 8 MG tablet Take 1 tablet (8 mg total) by mouth 2 (two) times daily as needed for refractory nausea / vomiting. Start on day 3 after chemo. (Patient not taking: Reported on 05/11/2019) 30 tablet 1   No current facility-administered medications for this visit.   Facility-Administered Medications Ordered in Other Visits  Medication Dose Route Frequency Provider Last Rate Last Admin  . sodium chloride flush (NS) 0.9 % injection 10 mL  10 mL Intracatheter PRN Brunetta Genera, MD   10 mL at 08/10/18 1558    REVIEW OF SYSTEMS:   A 10+ POINT REVIEW OF SYSTEMS WAS OBTAINED including neurology, dermatology, psychiatry, cardiac, respiratory, lymph, extremities, GI, GU, Musculoskeletal, constitutional, breasts, reproductive, HEENT.  All pertinent positives are noted in the HPI.  All others are negative.   PHYSICAL EXAMINATION: ECOG FS:1 - Symptomatic but completely ambulatory  Vitals:   05/11/19 1222  BP: 116/60  Pulse: 93  Resp: 18  Temp: 98 F (36.7 C)  SpO2: 96%   Wt Readings from Last 3 Encounters:  05/11/19 166 lb 14.4 oz (75.7 kg)  03/22/19 153 lb (69.4 kg)  02/10/19 159 lb (72.1 kg)   Body mass index is 29.57 kg/m.    GENERAL:alert, in no acute distress and comfortable SKIN: no acute rashes, no significant lesions EYES: conjunctiva are pink and non-injected, sclera anicteric OROPHARYNX: MMM, no exudates, no oropharyngeal erythema or ulceration NECK: supple, no JVD LYMPH:  no palpable lymphadenopathy in the cervical, axillary or inguinal regions LUNGS: clear to auscultation b/l with normal respiratory effort HEART: regular rate & rhythm ABDOMEN:  normoactive bowel sounds , non tender, not distended. No palpable hepatosplenomegaly.  Extremity: no pedal edema PSYCH: alert & oriented x 3 with fluent speech NEURO: no focal motor/sensory deficits  LABORATORY DATA:  I have reviewed the data as listed  . CBC Latest Ref Rng & Units 05/11/2019 02/10/2019  12/29/2018  WBC 4.0 - 10.5 K/uL 6.4 7.4 5.0  Hemoglobin 12.0 - 15.0 g/dL 11.2(L) 10.9(L) 10.8(L)  Hematocrit 36.0 - 46.0 % 34.5(L) 34.1(L) 33.0(L)  Platelets 150 - 400 K/uL 292 247 220    CMP Latest Ref Rng & Units 05/11/2019 02/10/2019 12/29/2018  Glucose 70 - 99 mg/dL 84 128(H) 120(H)  BUN 8 - 23 mg/dL 25(H) 29(H) 32(H)  Creatinine 0.44 - 1.00 mg/dL 1.25(H) 1.12(H) 0.87  Sodium 135 - 145 mmol/L 145 145 141  Potassium 3.5 - 5.1 mmol/L 4.3 3.9 4.0  Chloride 98 - 111 mmol/L 107 110 109  CO2 22 - 32 mmol/L 27 25 20(L)  Calcium 8.9 - 10.3 mg/dL 10.1 9.4 9.3  Total Protein 6.5 - 8.1 g/dL 7.1 7.2 6.9  Total Bilirubin 0.3 - 1.2 mg/dL 0.2(L) 0.2(L) 0.3  Alkaline Phos 38 - 126 U/L 64 74 75  AST 15 - 41 U/L '23 18 17  '$ ALT 0 - 44 U/L '12 15 13  '$ 01/26/2019 CT Abdomen Pelvis W Contrast (Accession [id number])    01/26/2019 CT Chest W Contrast (Accession [id number])   05/19/17 Fine Needle Aspiration:  05/19/17 Bronchial Washing Specimen B:   RADIOGRAPHIC STUDIES: I have personally reviewed the radiological images as listed and agreed with the findings in the report. No results found. MRI brain 04/21/2017: IMPRESSION: 1. Motion degraded examination. 2. 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 3. Otherwise negative MRI of the head with and without contrast for age.  Electronically Signed   By: Elon Alas M.D.   On: 04/22/2017 00:34    ASSESSMENT & PLAN:   64 y.o. female with  1.  Non-Small Cell Carcinoma atleast Stage IIIB  Not enough tissue for further characterization or mutation testing. Presenting with mediastinal nodal mass - TxN2 Mx (Atleast Stage III disease) ? Lingular primary vs inflammation. No overt evidence of metastatic disease.  -EGFR mutation studies on blood negative. Patient is s/p 3 cycles of Carboplatin/taxol -resolution left upper lobe ground-glass opacity has resolved in the interval. 08/19/17 PET  revealed  No significant change in size of hypermetabolic AP window mass. The degree of FDG uptake is slightly increased in the interval. 2. Persistent hypermetabolic focus involving the distal aspect of the tongue within SUV max of 16.46. Similar to previous exam. (no clinical co-relate). Lung involvement has decreased.   08/19/17 Brain MRI revealed No evidence of intracranial metastases. Interval resolution of bilateral parietal signal abnormality favoring PRES  Completed concurrent Carbo/taxol chemotherapy + RT  12/24/17 PET/CT revealed Marked interval response to therapy with large AP window lymph nodes seen on the previous study almost imperceptible on today's exam. Hypermetabolism within this lesion has resolved with FDG accumulation now at background blood pool levels. 2. Small focus of hypermetabolism in the anal region, similar to prior and indeterminate by PET imaging. 3. Hypermetabolic focus at the distal tongue has decreased in the Interval. 4. Marked pelvic floor laxity with cystocele. 5. Stable tiny left lung nodules. 6.  Aortic Atherosclerois. 7.  Emphysema.   04/03/17 endoscopy did not reveal esophageal thickening    04/27/18 CT Chest revealed Stable exam. No new or progressive findings. 2. Stable mild circumferential wall thickening mid esophagus. Esophagitis is a consideration. 3. Stable tiny left pulmonary nodules. 4.  Emphysema. 5.  Aortic Atherosclerois.  01/26/2019 CT Abdomen Pelvis W Contrast (Accession [id number]) revealed "1. Patchy areas of consolidation, bronchiectasis and ground-glass in the medial left hemithorax, presumably treatment related. No evidence of metastatic disease.2. Extensive debris  in the mainstem bronchi, bronchus intermedius and left upper lobe bronchus."    2. Bone Pain from neulasta - managed with Tylenol  3. Previous h/ot SZ -currently on Keppra. MRI brain - resolution of findings of PRES  4. H/o rt calf DVT - off anticoagulation due to massive GI  bleeding. S/p IVC filter. No issues currently  5. S/p previous GI bleeding from Gastric ulcer --needing clipping and arterial embolization. On PPI BID Ulcers seen to be healed with recent April 2020 repeat endoscopy  6. H/o ctx /steroid related thrush --now resolved.  8. Elevated TSH/Hypothyroidism -- ? Related to immunotherapy 07/13/18 Thyroid functions revealed TSH at 62.91 and Free T4 at 0.30 Began low dose 12mg Levothyroxine and provide endocrinology referral for further optimization. Recommend taking Levothyroxine 1.5 hours before to breakfast with only water and no other medications.  F/u with Dr. SKelton Pillarin Endocrinology   PLAN: -Discussed pt labwork today, 05/11/19; all values are WNL except for RBC at 3.65, Hgb at 11.2, HCT at 34.5, BUN at 25, Creatinine at 1.25, Total Bilirubin at 0.2, GFR Est Non Af Am at 462 -Discussed 05/11/2019 TSH is very elevated at 117.087 - patient has run out of her thyroid medication and hasnt taken any for 2-3 weeks. -Blood chemistries show some level of dehydration  -Recommended that the pt continue to eat well, drink at least 48-64 oz of water each day, and walk 20-30 minutes each day.  -Recommend pt receive the COVID19 vaccine when available -Advised pt that she is at a greater risk of CVOPFY92related complications due to her age and previous lung CA -Advised pt that the highest chance of Non-Small Cell Carcinoma recurrence is the highest in the first two years -Will continue to monitor with labs, clinic visits every 3-4 months, and scans every other time -Recommend pt f/u with Dr. SKelton Pillarfor continued mx of her hypothyroidism -Will repeat Chest CT and labs in 11 weeks -Will get port flush in 1-2 weeks  -Will see back in 3 months   FOLLOW UP: Portflush in 1-2 week CT chest, portflush and labs in 11 weeks MD visit in 12 weeks  All of the patient's questions were answered with apparent satisfaction. The patient knows to call the clinic  with any problems, questions or concerns.   GSullivan LoneMD MFive ForksAAHIVMS SSt. Catherine Of Siena Medical CenterCCapital Orthopedic Surgery Center LLCHematology/Oncology Physician COutpatient Womens And Childrens Surgery [facility] (Office):       [phone](Work cell):  [phone](Fax):           [phone] 05/11/2019 4:40 PM  I, JYevette Edwards am acting as a scribe for Dr. GSullivan Lone   .I have reviewed the above documentation for accuracy and completeness, and I agree with the above. .Brunetta GeneraMD

## 2019-05-12 ENCOUNTER — Other Ambulatory Visit: Payer: Self-pay

## 2019-05-12 MED ORDER — LEVOTHYROXINE SODIUM 88 MCG PO TABS: ORAL_TABLET | ORAL | 0 refills | Status: DC

## 2019-05-13 ENCOUNTER — Telehealth: Payer: Self-pay | Admitting: Hematology

## 2019-05-13 DIAGNOSIS — L400 Psoriasis vulgaris: Secondary | ICD-10-CM | POA: Diagnosis not present

## 2019-05-13 NOTE — Telephone Encounter (Signed)
Scheduled per 03/03 los, patient has been called and notified.

## 2019-05-25 ENCOUNTER — Inpatient Hospital Stay: Payer: PPO

## 2019-05-25 ENCOUNTER — Other Ambulatory Visit: Payer: Self-pay

## 2019-05-25 DIAGNOSIS — C3490 Malignant neoplasm of unspecified part of unspecified bronchus or lung: Secondary | ICD-10-CM

## 2019-05-25 DIAGNOSIS — Z95828 Presence of other vascular implants and grafts: Secondary | ICD-10-CM

## 2019-05-25 MED ORDER — SODIUM CHLORIDE 0.9% FLUSH
10.0000 mL | INTRAVENOUS | Status: DC | PRN
  Administered 2019-05-25: 10 mL
  Filled 2019-05-25: qty 10

## 2019-05-25 MED ORDER — HEPARIN SOD (PORK) LOCK FLUSH 100 UNIT/ML IV SOLN
500.0000 [IU] | Freq: Once | INTRAVENOUS | Status: AC | PRN
  Administered 2019-05-25: 500 [IU]
  Filled 2019-05-25: qty 5

## 2019-06-17 ENCOUNTER — Encounter: Payer: PPO | Admitting: Medical

## 2019-07-07 ENCOUNTER — Other Ambulatory Visit: Payer: Self-pay | Admitting: Internal Medicine

## 2019-07-11 ENCOUNTER — Telehealth: Payer: Self-pay | Admitting: Internal Medicine

## 2019-07-11 NOTE — Telephone Encounter (Signed)
Called pt to schedule appt since pt has not been seen since 08/19/2018 recording stated that call could not be completed, will try again later.

## 2019-07-11 NOTE — Telephone Encounter (Signed)
Pt informed

## 2019-07-11 NOTE — Telephone Encounter (Signed)
30 day was sent 05/12/2019 stating OV needed for further refills, please advise.

## 2019-07-11 NOTE — Telephone Encounter (Signed)
Medication Refill Request  Did you call your pharmacy and request this refill first? Yes  . If patient has not contacted pharmacy first, instruct them to do so for future refills.  . Remind them that contacting the pharmacy for their refill is the quickest method to get the refill.  . Refill policy also stated that it will take anywhere between 24-72 hours to receive the refill.    Name of medication? synthroid  Is this a 90 day supply? yes  Name and location of pharmacy?  for vv - if patient is active in [place], do they still get a link? or do they need to join video through their mychart app?

## 2019-07-15 DIAGNOSIS — L400 Psoriasis vulgaris: Secondary | ICD-10-CM | POA: Diagnosis not present

## 2019-07-26 ENCOUNTER — Ambulatory Visit (INDEPENDENT_AMBULATORY_CARE_PROVIDER_SITE_OTHER): Payer: PPO | Admitting: Women's Health

## 2019-07-26 ENCOUNTER — Encounter: Payer: Self-pay | Admitting: Women's Health

## 2019-07-26 ENCOUNTER — Other Ambulatory Visit: Payer: Self-pay

## 2019-07-26 ENCOUNTER — Other Ambulatory Visit (HOSPITAL_COMMUNITY)
Admission: RE | Admit: 2019-07-26 | Discharge: 2019-07-26 | Disposition: A | Discharge status: Home / Self Care | Payer: PPO | Source: Ambulatory Visit | Attending: Women's Health | Admitting: Women's Health

## 2019-07-26 VITALS — BP 135/54 | HR 97 | Wt 162.1 lb

## 2019-07-26 DIAGNOSIS — Z1151 Encounter for screening for human papillomavirus (HPV): Secondary | ICD-10-CM | POA: Insufficient documentation

## 2019-07-26 DIAGNOSIS — Z124 Encounter for screening for malignant neoplasm of cervix: Secondary | ICD-10-CM | POA: Insufficient documentation

## 2019-07-26 DIAGNOSIS — Z01419 Encounter for gynecological examination (general) (routine) without abnormal findings: Secondary | ICD-10-CM | POA: Diagnosis not present

## 2019-07-26 DIAGNOSIS — N814 Uterovaginal prolapse, unspecified: Secondary | ICD-10-CM | POA: Diagnosis not present

## 2019-07-26 DIAGNOSIS — Z1231 Encounter for screening mammogram for malignant neoplasm of breast: Secondary | ICD-10-CM

## 2019-07-26 NOTE — Patient Instructions (Addendum)
Breast Self-Awareness Breast self-awareness means being familiar with how your breasts look and feel. It involves checking your breasts regularly and reporting any changes to your health care provider. Practicing breast self-awareness is important. Sometimes changes may not be harmful (are benign), but sometimes a change in your breasts can be a sign of a serious medical problem. It is important to learn how to do this procedure correctly so that you can catch problems early, when treatment is more likely to be successful. All women should practice breast self-awareness, including women who have had breast implants. What you need:  A mirror.  A well-lit room. How to do a breast self-exam A breast self-exam is one way to learn what is normal for your breasts and whether your breasts are changing. To do a breast self-exam: Look for changes  1. Remove all the clothing above your waist. 2. Stand in front of a mirror in a room with good lighting. 3. Put your hands on your hips. 4. Push your hands firmly downward. 5. Compare your breasts in the mirror. Look for differences between them (asymmetry), such as: ? Differences in shape. ? Differences in size. ? Puckers, dips, and bumps in one breast and not the other. 6. Look at each breast for changes in the skin, such as: ? Redness. ? Scaly areas. 7. Look for changes in your nipples, such as: ? Discharge. ? Bleeding. ? Dimpling. ? Redness. ? A change in position. Feel for changes Carefully feel your breasts for lumps and changes. It is best to do this while lying on your back on the floor, and again while sitting or standing in the tub or shower with soapy water on your skin. Feel each breast in the following way: 1. Place the arm on the side of the breast you are examining above your head. 2. Feel your breast with the other hand. 3. Start in the nipple area and make -inch (2 cm) overlapping circles to feel your breast. Use the pads of your  three middle fingers to do this. Apply light pressure, then medium pressure, then firm pressure. The light pressure will allow you to feel the tissue closest to the skin. The medium pressure will allow you to feel the tissue that is a little deeper. The firm pressure will allow you to feel the tissue close to the ribs. 4. Continue the overlapping circles, moving downward over the breast until you feel your ribs below your breast. 5. Move one finger-width toward the center of the body. Continue to use the -inch (2 cm) overlapping circles to feel your breast as you move slowly up toward your collarbone. 6. Continue the up-and-down exam using all three pressures until you reach your armpit.  Write down what you find Writing down what you find can help you remember what to discuss with your health care provider. Write down:  What is normal for each breast.  Any changes that you find in each breast, including: ? The kind of changes you find. ? Any pain or tenderness. ? Size and location of any lumps.  Where you are in your menstrual cycle, if you are still menstruating. General tips and recommendations  Examine your breasts every month.  If you are breastfeeding, the best time to examine your breasts is after a feeding or after using a breast pump.  If you menstruate, the best time to examine your breasts is 5-7 days after your period. Breasts are generally lumpier during menstrual periods, and it may  be more difficult to notice changes.  With time and practice, you will become more familiar with the variations in your breasts and more comfortable with the exam. Contact a health care provider if you:  See a change in the shape or size of your breasts or nipples.  See a change in the skin of your breast or nipples, such as a reddened or scaly area.  Have unusual discharge from your nipples.  Find a lump or thick area that was not there before.  Have pain in your breasts.  Have any  concerns related to your breast health. Summary  Breast self-awareness includes looking for physical changes in your breasts, as well as feeling for any changes within your breasts.  Breast self-awareness should be performed in front of a mirror in a well-lit room.  You should examine your breasts every month. If you menstruate, the best time to examine your breasts is 5-7 days after your menstrual period.  Let your health care provider know of any changes you notice in your breasts, including changes in size, changes on the skin, pain or tenderness, or unusual fluid from your nipples. This information is not intended to replace advice given to you by your health care provider. Make sure you discuss any questions you have with your health care provider. Document Revised: 10/13/2017 Document Reviewed: 10/13/2017 Elsevier Patient Education  [place] 65 Years and Older, Female Preventive care refers to lifestyle choices and visits with your health care provider that can promote health and wellness. This includes:  A yearly physical exam. This is also called an annual well check.  Regular dental and eye exams.  Immunizations.  Screening for certain conditions.  Healthy lifestyle choices, such as diet and exercise. What can I expect for my preventive care visit? Physical exam Your health care provider will check:  Height and weight. These may be used to calculate body mass index (BMI), which is a measurement that tells if you are at a healthy weight.  Heart rate and blood pressure.  Your skin for abnormal spots. Counseling Your health care provider may ask you questions about:  Alcohol, tobacco, and drug use.  Emotional well-being.  Home and relationship well-being.  Sexual activity.  Eating habits.  History of falls.  Memory and ability to understand (cognition).  Work and work Statistician.  Pregnancy and menstrual history.  What immunizations do I need?  Influenza (flu) vaccine  This is recommended every year. Tetanus, diphtheria, and pertussis (Tdap) vaccine  You may need a Td booster every 10 years. Varicella (chickenpox) vaccine  You may need this vaccine if you have not already been vaccinated. Zoster (shingles) vaccine  You may need this after age 92. Pneumococcal conjugate (PCV13) vaccine  One dose is recommended after age 34. Pneumococcal polysaccharide (PPSV23) vaccine  One dose is recommended after age 18. Measles, mumps, and rubella (MMR) vaccine  You may need at least one dose of MMR if you were born in 1957 or later. You may also need a second dose. Meningococcal conjugate (MenACWY) vaccine  You may need this if you have certain conditions. Hepatitis A vaccine  You may need this if you have certain conditions or if you travel or work in places where you may be exposed to hepatitis A. Hepatitis B vaccine  You may need this if you have certain conditions or if you travel or work in places where you may be exposed to  hepatitis B. Haemophilus influenzae type b (Hib) vaccine  You may need this if you have certain conditions. You may receive vaccines as individual doses or as more than one vaccine together in one shot (combination vaccines). Talk with your health care provider about the risks and benefits of combination vaccines. What tests do I need? Blood tests  Lipid and cholesterol levels. These may be checked every 5 years, or more frequently depending on your overall health.  Hepatitis C test.  Hepatitis B test. Screening  Lung cancer screening. You may have this screening every year starting at age 70 if you have a 30-pack-year history of smoking and currently smoke or have quit within the past 15 years.  Colorectal cancer screening. All adults should have this screening starting at age 58 and continuing until age 24. Your health care provider may recommend screening at age  53 if you are at increased risk. You will have tests every 1-10 years, depending on your results and the type of screening test.  Diabetes screening. This is done by checking your blood sugar (glucose) after you have not eaten for a while (fasting). You may have this done every 1-3 years.  Mammogram. This may be done every 1-2 years. Talk with your health care provider about how often you should have regular mammograms.  BRCA-related cancer screening. This may be done if you have a family history of breast, ovarian, tubal, or peritoneal cancers. Other tests  Sexually transmitted disease (STD) testing.  Bone density scan. This is done to screen for osteoporosis. You may have this done starting at age 70. Follow these instructions at home: Eating and drinking  Eat a diet that includes fresh fruits and vegetables, whole grains, lean protein, and low-fat dairy products. Limit your intake of foods with high amounts of sugar, saturated fats, and salt.  Take vitamin and mineral supplements as recommended by your health care provider.  Do not drink alcohol if your health care provider tells you not to drink.  If you drink alcohol: ? Limit how much you have to 0-1 drink a day. ? Be aware of how much alcohol is in your drink. In the [place] one drink equals one 12 oz bottle of beer (355 mL), one 5 oz glass of wine (148 mL), or one 1 oz glass of hard liquor (44 mL). Lifestyle  Take daily care of your teeth and gums.  Stay active. Exercise for at least 30 minutes on 5 or more days each week.  Do not use any products that contain nicotine or tobacco, such as cigarettes, e-cigarettes, and chewing tobacco. If you need help quitting, ask your health care provider.  If you are sexually active, practice safe sex. Use a condom or other form of protection in order to prevent STIs (sexually transmitted infections).  Talk with your health care provider about taking a low-dose aspirin or statin. What's  next?  Go to your health care provider once a year for a well check visit.  Ask your health care provider how often you should have your eyes and teeth checked.  Stay up to date on all vaccines. This information is not intended to replace advice given to you by your health care provider. Make sure you discuss any questions you have with your health care provider. Document Revised: 02/18/2018 Document Reviewed: 02/18/2018 Elsevier Patient Education  2020 [employer].

## 2019-07-26 NOTE — Progress Notes (Signed)
GYNECOLOGY ANNUAL PREVENTATIVE CARE ENCOUNTER NOTE  History:     Kayla Wyatt is a 64 y.o. No obstetric history on file. female here for a routine annual gynecologic exam.  Current complaints: none, but patient reports uterine prolapse that has been present since 2019. Patient reports she was diagnosed with lung cancer January 2019, but was advised to wait for surgery for prolapse and now that she is cancer free would like to discuss surgical options. Patient reports it does not cause her any issues, but would "like it taken care of." Denies abnormal vaginal bleeding, discharge, pelvic pain, problems with intercourse or other gynecologic concerns. Pt declines STD testing today. Pt reports she does perform SBE. Pt denies bowel or bladder concerns. No family hx of breast, colon, endometrial or ovarian cancer. Pt does not smoke, drink or use drugs. Last dental exam: unsure. Last eye exam: 2018.      Gynecologic History No LMP recorded. Patient is postmenopausal. Menstruation: LMP 10/1994 Last Pap: unknown. Results were: normal per patient. Pt denies history of abnormal Pap.  Last mammogram: unsure. Results were: normal Last colonoscopy: 04/2017. Results were: normal Last DEXA scan: unsure, will request records, patient reports was normal Postmenopausal bleeding? none  Obstetric History OB History  No obstetric history on file.    Past Medical History:  Diagnosis Date  . Acute blood loss anemia 04/02/2017  . Anxiety   . Gastrointestinal hemorrhage with melena   . Headache   . Hypercholesteremia   . Hypertension   . lung ca dx'd 05/2017  . Persistent atrial fibrillation with rapid ventricular response ([place]) 04/07/2017  . Seizures ([place])    04/2017 while in hospital at [facility]  . Vitamin D deficiency     Past Surgical History:  Procedure Laterality Date  . COLONOSCOPY Left 04/09/2017   Procedure: COLONOSCOPY;  Surgeon: Carol Ada, MD;  Location: [facility];  Service:  Endoscopy;  Laterality: Left;  . ESOPHAGOGASTRODUODENOSCOPY N/A 04/03/2017   Procedure: ESOPHAGOGASTRODUODENOSCOPY (EGD);  Surgeon: Carol Ada, MD;  Location: [place];  Service: Endoscopy;  Laterality: N/A;  . ESOPHAGOGASTRODUODENOSCOPY (EGD) WITH PROPOFOL N/A 04/12/2017   Procedure: ESOPHAGOGASTRODUODENOSCOPY (EGD) WITH PROPOFOL;  Surgeon: Milus Banister, MD;  Location: [facility] ENDOSCOPY;  Service: Endoscopy;  Laterality: N/A;  . IR ANGIOGRAM SELECTIVE EACH ADDITIONAL VESSEL  04/12/2017  . IR ANGIOGRAM SELECTIVE EACH ADDITIONAL VESSEL  04/13/2017  . IR ANGIOGRAM SELECTIVE EACH ADDITIONAL VESSEL  04/13/2017  . IR ANGIOGRAM SELECTIVE EACH ADDITIONAL VESSEL  04/13/2017  . IR ANGIOGRAM VISCERAL SELECTIVE  04/12/2017  . IR ANGIOGRAM VISCERAL SELECTIVE  04/13/2017  . IR EMBO ART  VEN HEMORR LYMPH EXTRAV  INC GUIDE ROADMAPPING  04/12/2017  . IR EMBO ART  VEN HEMORR LYMPH EXTRAV  INC GUIDE ROADMAPPING  04/13/2017  . IR FLUORO GUIDE CV LINE RIGHT  04/13/2017  . IR FLUORO GUIDE PORT INSERTION RIGHT  07/27/2017  . IR IVC FILTER PLMT / S&I /IMG GUID/MOD SED  04/12/2017  . IR US GUIDE VASC ACCESS RIGHT  04/12/2017  . IR US GUIDE VASC ACCESS RIGHT  04/13/2017  . IR US GUIDE VASC ACCESS RIGHT  04/13/2017  . IR US GUIDE VASC ACCESS RIGHT  07/27/2017  . LEG SURGERY  1974   Blood Clot Removal   . VIDEO BRONCHOSCOPY WITH ENDOBRONCHIAL ULTRASOUND N/A 05/19/2017   Procedure: VIDEO BRONCHOSCOPY WITH ENDOBRONCHIAL ULTRASOUND;  Surgeon: Grace Isaac, MD;  Location: [place];  Service: Thoracic;  Laterality: N/A;    Current Outpatient Medications  on File Prior to Visit  Medication Sig Dispense Refill  . acetaminophen (TYLENOL) 325 MG tablet Take 1-2 tablets (325-650 mg total) by mouth every 4 (four) hours as needed for mild pain.    . budesonide-formoterol (SYMBICORT) 80-4.5 MCG/ACT inhaler Inhale 2 puffs into the lungs 2 (two) times daily. 1 Inhaler 12  . cholecalciferol (VITAMIN D) 1000 units tablet Take 1,000 Units by mouth 2  (two) times daily.    . clotrimazole-betamethasone (LOTRISONE) cream Apply 1 application topically 2 (two) times daily. To rash on upper and lower extremities 45 g 0  . levothyroxine (SYNTHROID) 88 MCG tablet TAKE 1 TABLET(88 MCG) BY MOUTH DAILY 30 tablet 0  . lidocaine-prilocaine (EMLA) cream Apply to affected area once 30 g 3  . lisinopril-hydrochlorothiazide (PRINZIDE,ZESTORETIC) 20-12.5 MG tablet Take 1 tablet by mouth daily.    . polycarbophil (FIBERCON) 625 MG tablet Take 2 tablets (1,250 mg total) by mouth 2 (two) times daily. 120 tablet 0  . triamcinolone cream (KENALOG) 0.1 % APPLY THIN LAYER TO THE AFFECTED AREA TWICE DAILY    . apixaban (ELIQUIS) 5 MG TABS tablet Take 1 tablet (5 mg total) by mouth daily. (Patient not taking: Reported on 07/26/2019) 30 tablet 1  . levETIRAcetam (KEPPRA) 500 MG tablet TK 1 T PO BID  6  . LORazepam (ATIVAN) 0.5 MG tablet Take 2 tablets (1 mg total) by mouth once as needed for up to 1 dose (prior to PET/CT and MRI brain for anxiety). (Patient not taking: Reported on 07/26/2019) 6 tablet 0  . ondansetron (ZOFRAN) 8 MG tablet Take 1 tablet (8 mg total) by mouth 2 (two) times daily as needed for refractory nausea / vomiting. Start on day 3 after chemo. (Patient not taking: Reported on 05/11/2019) 30 tablet 1  . pantoprazole (PROTONIX) 40 MG tablet TK 1 T PO BID  6  . saccharomyces boulardii (FLORASTOR) 250 MG capsule Take 250 mg by mouth 2 (two) times daily.     Current Facility-Administered Medications on File Prior to Visit  Medication Dose Route Frequency Provider Last Rate Last Admin  . sodium chloride flush (NS) 0.9 % injection 10 mL  10 mL Intracatheter PRN Brunetta Genera, MD   10 mL at 08/10/18 1558    No Known Allergies  Social History:  reports that she quit smoking about 2 years ago. Her smoking use included cigarettes. She has a 20.00 pack-year smoking history. She has never used smokeless tobacco. She reports that she does not drink alcohol or  use drugs.  Family History  Problem Relation Age of Onset  . Heart disease Father     The following portions of the patient's history were reviewed and updated as appropriate: allergies, current medications, past family history, past medical history, past social history, past surgical history and problem list.  Review of Systems Pertinent items noted in HPI and remainder of comprehensive ROS otherwise negative.  Physical Exam:  BP (!) 135/54   Pulse 97   Wt 162 lb 1.6 oz (73.5 kg)   BMI 28.71 kg/m  CONSTITUTIONAL: Well-developed, well-nourished female in no acute distress.  HENT:  Normocephalic, atraumatic, External right and left ear normal. EYES: Conjunctivae and EOM are normal. Pupils are equal, round, and reactive to light. No scleral icterus.  NECK: Normal range of motion, supple, no masses.  Normal thyroid.  SKIN: Skin is warm and dry. No rash noted. Not diaphoretic. No erythema. No pallor. MUSCULOSKELETAL: Normal range of motion. No tenderness.  No cyanosis, clubbing,  or edema. NEUROLOGIC: Alert and oriented to person, place, and time. Normal reflexes, muscle tone coordination. PSYCHIATRIC: Normal mood and affect. Normal behavior. Normal judgment and thought content. CARDIOVASCULAR: Normal heart rate noted, regular rhythm. RESPIRATORY: Clear to auscultation bilaterally. Effort and breath sounds normal, no problems with respiration noted. BREASTS: Symmetric in size. No masses, skin changes, nipple drainage, or lymphadenopathy. Careful palpation to avoid port on right chest. ABDOMEN: Soft, normal bowel sounds, no distention noted.  No tenderness, rebound or guarding noted, however, minimal surface pressure only applied d/t patient report of IVC in-place. PELVIC: Normal appearing external genitalia; Stage IV uterine prolapse present with apparent sore on right sided uterine body and redness present on cervix. Redness along buttocks and mons which patient reports is psoriasis that  she saw dermatology for on 05/07 and is improving. Patient confirms uterine prolapse does not bother her. No abnormal discharge noted.  Pap smear obtained.   Assessment and Plan:    1. Pap smear for cervical cancer screening - Cytology - PAP( [place])  2. Visit for screening mammogram - MM 3D SCREEN BREAST BILATERAL; Future  3. Well woman exam -no concerns  4. Uterine prolapse -will schedule appt with female MD to discuss prolapse ASAP  Will follow up results of pap smear and other testing, if performed, and manage accordingly. Mammogram to be scheduled Routine preventative health maintenance measures emphasized. Self-breast awareness taught, importance discussed, advised when to RTC, SBA literature given. Please refer to After Visit Summary for other counseling recommendations.      Stacey Drain, [facility] Women's Health Nurse Practitioner, [name] for [employer], [place]

## 2019-07-27 ENCOUNTER — Inpatient Hospital Stay: Payer: PPO | Attending: Hematology

## 2019-07-27 ENCOUNTER — Inpatient Hospital Stay: Payer: PPO

## 2019-07-27 ENCOUNTER — Other Ambulatory Visit: Payer: PPO

## 2019-07-27 ENCOUNTER — Other Ambulatory Visit: Payer: Self-pay

## 2019-07-27 ENCOUNTER — Ambulatory Visit (HOSPITAL_COMMUNITY)
Admission: RE | Admit: 2019-07-27 | Discharge: 2019-07-27 | Disposition: A | Discharge status: Home / Self Care | Payer: PPO | Source: Ambulatory Visit | Attending: Hematology | Admitting: Hematology

## 2019-07-27 ENCOUNTER — Encounter (HOSPITAL_COMMUNITY): Payer: Self-pay

## 2019-07-27 DIAGNOSIS — L409 Psoriasis, unspecified: Secondary | ICD-10-CM | POA: Insufficient documentation

## 2019-07-27 DIAGNOSIS — J439 Emphysema, unspecified: Secondary | ICD-10-CM | POA: Insufficient documentation

## 2019-07-27 DIAGNOSIS — I700 Atherosclerosis of aorta: Secondary | ICD-10-CM | POA: Diagnosis not present

## 2019-07-27 DIAGNOSIS — C3490 Malignant neoplasm of unspecified part of unspecified bronchus or lung: Secondary | ICD-10-CM | POA: Insufficient documentation

## 2019-07-27 DIAGNOSIS — Z5111 Encounter for antineoplastic chemotherapy: Secondary | ICD-10-CM

## 2019-07-27 DIAGNOSIS — Z95828 Presence of other vascular implants and grafts: Secondary | ICD-10-CM

## 2019-07-27 DIAGNOSIS — Z85118 Personal history of other malignant neoplasm of bronchus and lung: Secondary | ICD-10-CM | POA: Diagnosis not present

## 2019-07-27 DIAGNOSIS — E039 Hypothyroidism, unspecified: Secondary | ICD-10-CM | POA: Diagnosis not present

## 2019-07-27 DIAGNOSIS — D649 Anemia, unspecified: Secondary | ICD-10-CM | POA: Insufficient documentation

## 2019-07-27 DIAGNOSIS — Z87891 Personal history of nicotine dependence: Secondary | ICD-10-CM | POA: Diagnosis not present

## 2019-07-27 DIAGNOSIS — Z86718 Personal history of other venous thrombosis and embolism: Secondary | ICD-10-CM | POA: Insufficient documentation

## 2019-07-27 DIAGNOSIS — R918 Other nonspecific abnormal finding of lung field: Secondary | ICD-10-CM | POA: Diagnosis not present

## 2019-07-27 DIAGNOSIS — E032 Hypothyroidism due to medicaments and other exogenous substances: Secondary | ICD-10-CM

## 2019-07-27 LAB — CBC WITH DIFFERENTIAL/PLATELET
Abs Immature Granulocytes: 0.04 10*3/uL (ref 0.00–0.07)
Basophils Absolute: 0 10*3/uL (ref 0.0–0.1)
Basophils Relative: 0 %
Eosinophils Absolute: 0.2 10*3/uL (ref 0.0–0.5)
Eosinophils Relative: 2 %
HCT: 30.3 % — ABNORMAL LOW (ref 36.0–46.0)
Hemoglobin: 9.8 g/dL — ABNORMAL LOW (ref 12.0–15.0)
Immature Granulocytes: 0 %
Lymphocytes Relative: 14 %
Lymphs Abs: 1.2 10*3/uL (ref 0.7–4.0)
MCH: 30.7 pg (ref 26.0–34.0)
MCHC: 32.3 g/dL (ref 30.0–36.0)
MCV: 95 fL (ref 80.0–100.0)
Monocytes Absolute: 0.7 10*3/uL (ref 0.1–1.0)
Monocytes Relative: 8 %
Neutro Abs: 6.8 10*3/uL (ref 1.7–7.7)
Neutrophils Relative %: 76 %
Platelets: 282 10*3/uL (ref 150–400)
RBC: 3.19 MIL/uL — ABNORMAL LOW (ref 3.87–5.11)
RDW: 14.3 % (ref 11.5–15.5)
WBC: 9 10*3/uL (ref 4.0–10.5)
nRBC: 0 % (ref 0.0–0.2)

## 2019-07-27 LAB — CMP (CANCER CENTER ONLY)
ALT: 13 U/L (ref 0–44)
AST: 18 U/L (ref 15–41)
Albumin: 3.4 g/dL — ABNORMAL LOW (ref 3.5–5.0)
Alkaline Phosphatase: 69 U/L (ref 38–126)
Anion gap: 10 (ref 5–15)
BUN: 20 mg/dL (ref 8–23)
CO2: 26 mmol/L (ref 22–32)
Calcium: 9.2 mg/dL (ref 8.9–10.3)
Chloride: 105 mmol/L (ref 98–111)
Creatinine: 0.9 mg/dL (ref 0.44–1.00)
GFR, Est AFR Am: >60 mL/min (ref >60)
GFR, Estimated: >60 mL/min (ref >60)
Glucose, Bld: 98 mg/dL (ref 70–99)
Potassium: 3.8 mmol/L (ref 3.5–5.1)
Sodium: 141 mmol/L (ref 135–145)
Total Bilirubin: 0.2 mg/dL — ABNORMAL LOW (ref 0.3–1.2)
Total Protein: 6.7 g/dL (ref 6.5–8.1)

## 2019-07-27 LAB — TSH: TSH: 0.477 u[IU]/mL (ref 0.308–3.960)

## 2019-07-27 MED ORDER — HEPARIN SOD (PORK) LOCK FLUSH 100 UNIT/ML IV SOLN
INTRAVENOUS | Status: AC
  Filled 2019-07-27: qty 5

## 2019-07-27 MED ORDER — SODIUM CHLORIDE 0.9% FLUSH
10.0000 mL | INTRAVENOUS | Status: DC | PRN
  Administered 2019-07-27: 10 mL
  Filled 2019-07-27: qty 10

## 2019-07-27 MED ORDER — OCTREOTIDE ACETATE 20 MG IM KIT
PACK | INTRAMUSCULAR | Status: AC
  Filled 2019-07-27: qty 1

## 2019-07-27 MED ORDER — HEPARIN SOD (PORK) LOCK FLUSH 100 UNIT/ML IV SOLN
500.0000 [IU] | Freq: Once | INTRAVENOUS | Status: AC
  Administered 2019-07-27: 500 [IU] via INTRAVENOUS

## 2019-07-27 MED ORDER — IOHEXOL 300 MG/ML  SOLN
75.0000 mL | Freq: Once | INTRAMUSCULAR | Status: AC | PRN
  Administered 2019-07-27: 75 mL via INTRAVENOUS

## 2019-07-28 LAB — CYTOLOGY - PAP
Comment: NEGATIVE
Diagnosis: NEGATIVE
High risk HPV: NEGATIVE

## 2019-07-29 ENCOUNTER — Telehealth: Payer: Self-pay

## 2019-07-29 NOTE — Telephone Encounter (Signed)
Called patient with normal pap results patient verbalized understanding and ended the call.

## 2019-08-03 ENCOUNTER — Inpatient Hospital Stay: Payer: PPO | Admitting: Hematology

## 2019-08-03 ENCOUNTER — Other Ambulatory Visit: Payer: Self-pay

## 2019-08-03 VITALS — BP 132/60 | HR 100 | Temp 97.8°F | Resp 20 | Ht 63.0 in | Wt 162.9 lb

## 2019-08-03 DIAGNOSIS — Z85118 Personal history of other malignant neoplasm of bronchus and lung: Secondary | ICD-10-CM | POA: Diagnosis not present

## 2019-08-03 DIAGNOSIS — E7800 Pure hypercholesterolemia, unspecified: Secondary | ICD-10-CM | POA: Diagnosis not present

## 2019-08-03 DIAGNOSIS — E039 Hypothyroidism, unspecified: Secondary | ICD-10-CM | POA: Diagnosis not present

## 2019-08-03 DIAGNOSIS — J438 Other emphysema: Secondary | ICD-10-CM | POA: Diagnosis not present

## 2019-08-03 DIAGNOSIS — IMO0001 Reserved for inherently not codable concepts without codable children: Secondary | ICD-10-CM

## 2019-08-03 DIAGNOSIS — R911 Solitary pulmonary nodule: Secondary | ICD-10-CM

## 2019-08-03 DIAGNOSIS — C3490 Malignant neoplasm of unspecified part of unspecified bronchus or lung: Secondary | ICD-10-CM | POA: Diagnosis not present

## 2019-08-03 DIAGNOSIS — I10 Essential (primary) hypertension: Secondary | ICD-10-CM | POA: Diagnosis not present

## 2019-08-03 NOTE — Progress Notes (Signed)
Kayla Wyatt    HEMATOLOGY/ONCOLOGY CLINIC NOTE  Date of Service: 08/05/2019   Patient Care Team: [name], MD as PCP - General (Family Medicine)  CHIEF COMPLAINTS/PURPOSE OF CONSULTATION:   F/u for continued management of Non-Small Cell lung Carcinoma  HISTORY OF PRESENTING ILLNESS:   Kayla Wyatt is a wonderful 64 y.o. female who has been referred to [place] by Dr [name] for evaluation and management of Non-Small Cell Carcinoma. She is accompanied today by her husband. The pt reports that she is doing well overall.   Patient recently had a significant hospitalization on 04/02/2017 - as per DC summary by [name] " with abdominal pain, hematemesis, melena and ABLA due to GIB.  She underwent clipping of gastric ulcer and received multiple units PRBC but continued to have drop in H/H with bloody stool. NM GI scan was negative for bleeding and she underwent colonoscopy by Dr. [name] 1/31 showing diverticulosis but no signs of bleeding. Hospital course significant for R- gastrocnemius DVT, PAF, incidental findings of mediastinal mass as well as brief episode of hematuria. She was cleared to start on Xarelto on 02/01 for treatment of DVT. On 2/2 am she developed hematemesis with maroon stools and hypotension requiring fluid bolus as well as 2 units PRBCs. She continued to decline requiring intubation, pressors as well as reversal of anticoagulation with Kcentra on 02/3. She underwent UGI revealing large clot with fresh blood in stomach and underwent mesenteric arteriogram with percutaneous coil embolization fo distal tributary of left gastric artery and placement of IVC filter by interventional radiology.   A fib with RVR felt to be due to hemorrhagic shock and she converted to NSR. [name] felt no further cardiac work up indicated and did not recommend initiating anticoagulation in short term. To follow up with [name] after PET scan and routine biopsy after medically stable. She  continued to have bleeding and underwent visceral angiogram with embolization of inferior division of splenic artery and associated gastric arteries on 2/4. No surgical intervention needed per [name] with recommendations to continue monitoring H/H as well as for recurrence of hematochezia. She tolerated extubation on 2/6 and respiratory status improving.  On 2/11, she has episode of unresponsiveness with jerking of bilateral limbs that lasted about 3 minutes. She had amnesia of events but was back to baseline. EEG revealed "focal slowing over the right temporo-occipital regionandoccasional epileptiform discharges over the right occipital region". Head CT reviewed, unremarkable for acute intracranial process. MRI brain done revealing mild biparietal signal abnormality suspicious for PRES. [name] recommended starting patient on Keppra BID with repeat MRI in 2 weeks. New onset seizure likely provoked by PRES--if resolved--Ok to take patient off Keppra. Patient with resultant generalized weakness. CIR recommended due to functional deficits. "  Later as outpatient she had a PET scan on 05/14/17 which showed a hypermetabolic mediastinal mass which was subsequently biopsied with a bronchoscopy on 05/19/17. She notes that she had smoked for about 35-40 years, half a pack of a day. She reports that she has not had any other symptoms related to her smoking; she has never had to be on home oxygen or inhalers. She has recently begun Symbicort. While being in the hospital she lost about 20 lbs but has maintained her weight since being discharged.  The pt and her husband note that she is concerned about what to do if she is not able to go back to work after her 90-day leave. We recommended that they speak with  our social workers here to explore the available options for insurance coverage /disability application etc.  Of note prior to the patient's visit today, pt has had PET/CT completed on 05/14/17 with  results revealing 1. The known mediastinal mass is intensely hypermetabolic, worrisome for malignancy. This could reflect lymphoma or small cell lung cancer. Tissue sampling recommended.2. No peripheral lung mass identified. New patchy ground-glass opacity in the lingula is mildly hypermetabolic and likely inflammatory.  On 05/19/17 the pt had a Fine Needle Aspiration which revealed Non-Small cell lung carcinoma.  Most recent lab results (05/19/17) of CBC  is as follows: all values are WNL except for RBC at 2.90, Hgb at 8.6, HCT at 28.0, RDW at 16.4.  On review of systems, pt reports knee pain, and denies abdominal pains, leg swelling, SOB, CP, and any other symptoms.   On PMHx the pt reports acute blood loss anemia, GI hemorrhage with melena, hypercholesteremia, HTN, persistent atrial fibrillation with rapid ventricular response, Vitamin D deficiency, denies liver and kidney problems. On Social Hx the pt reports having smoked for 35-40 years of half a pack per day. She quit smoking on 03/31/17.  On Family Hx the pt reports that her father had heart disease.  Interval History:   Kayla Wyatt returns today regarding her Non-Small Cell Carcinoma. The patient's last visit with [place] was on 05/11/2019. The pt reports that she is doing well overall.  The pt reports that her Psoriasis rash has improved since our last visit. Pt has been eating and drinking well and has continued her smoking cessation. She denies any recent respiratory infections or symptoms.   Of note since the patient's last visit, pt has had CT Chest ([id number]) completed on 07/27/2019 with results revealing "1. Post treatment changes about the LEFT hilum. 2. Nodularity along the pleural surface in the posterior LEFT chest with increase, raising the question of metastasis or metachronous primary, particularly given the presence of subtle nodular ground-glass in this area in February of 2020, increasing slightly over time. PET scan may  be helpful for further assessment. 3. Slight increasing nodularity above the LEFT hilum along the major fissure potentially evolving post treatment changes but given other findings suggest attention to this area on PET evaluation. 4. Emphysema and aortic atherosclerosis."  Lab results (07/27/19) of CBC w/diff and CMP is as follows: all values are WNL except for RBC at 3.19, Hgb at 9.8, HCT at 30.3, Albumin at 3.4, Total Bilirubin at 0.2. 07/27/2019 TSH at 0.477  On review of systems, pt reports improving rash and denies cough, chest pain, SOB, bloody/black stools, bowel habit changes, abdominal pain and any other symptoms.   MEDICAL HISTORY:  Past Medical History:  Diagnosis Date  . Acute blood loss anemia 04/02/2017  . Anxiety   . Gastrointestinal hemorrhage with melena   . Headache   . Hypercholesteremia   . Hypertension   . lung ca dx'd 05/2017  . Persistent atrial fibrillation with rapid ventricular response ([place]) 04/07/2017  . Seizures ([place])    04/2017 while in hospital at [facility]  . Vitamin D deficiency     SURGICAL HISTORY: Past Surgical History:  Procedure Laterality Date  . COLONOSCOPY Left 04/09/2017   Procedure: COLONOSCOPY;  Surgeon: Carol Ada, MD;  Location: [facility];  Service: Endoscopy;  Laterality: Left;  . ESOPHAGOGASTRODUODENOSCOPY N/A 04/03/2017   Procedure: ESOPHAGOGASTRODUODENOSCOPY (EGD);  Surgeon: Carol Ada, MD;  Location: [facility];  Service: Endoscopy;  Laterality: N/A;  . ESOPHAGOGASTRODUODENOSCOPY (EGD) WITH PROPOFOL N/A 04/12/2017  Procedure: ESOPHAGOGASTRODUODENOSCOPY (EGD) WITH PROPOFOL;  Surgeon: Milus Banister, MD;  Location[facility] END[facility]COPY;  Service: Endoscopy;  Laterality: N/A;  . IR ANGIOGRAM SELECTIVE EACH ADDITIONAL VESSEL  04/12/2017  . IR ANGIOGRAM SELECTIVE EACH ADDITIONAL VESSEL  04/13/2017  . IR ANGIOGRAM SELECTIVE EACH ADDITIONAL VESSEL  04/13/2017  . IR ANGIOGRAM SELECTIVE EACH ADDITIONAL VESSEL  04/13/2017  . IR ANGIOGRAM VISCERAL  SELECTIVE  04/12/2017  . IR ANGIOGRAM VISCERAL SELECTIVE  04/13/2017  . IR EMBO ART  VEN HEMORR LYMPH EXTRAV  INC GUIDE ROADMAPPING  04/12/2017  . IR EMBO ART  VEN HEMORR LYMPH EXTRAV  INC GUIDE ROADMAPPING  04/13/2017  . IR FLUORO GUIDE CV LINE RIGHT  04/13/2017  . IR FLUORO GUIDE PORT INSERTION RIGHT  07/27/2017  . IR IVC FILTER PLMT / S&I /IMG GUID/MOD SED  04/12/2017  . IR US GUIDE VASC ACCESS RIGHT  04/12/2017  . IR US GUIDE VASC ACCESS RIGHT  04/13/2017  . IR US GUIDE VASC ACCESS RIGHT  04/13/2017  . IR US GUIDE VASC ACCESS RIGHT  07/27/2017  . LEG SURGERY  1974   Blood Clot Removal   . VIDEO BRONCHOSCOPY WITH ENDOBRONCHIAL ULTRASOUND N/A 05/19/2017   Procedure: VIDEO BRONCHOSCOPY WITH ENDOBRONCHIAL ULTRASOUND;  Surgeon: Grace Isaac, MD;  Location: [facility];  Service: Thoracic;  Laterality: N/A;    SOCIAL HISTORY: Social History   Socioeconomic History  . Marital status: Single    Spouse name: Not on file  . Number of children: Not on file  . Years of education: Not on file  . Highest education level: Not on file  Occupational History  . Occupation: Has to lift heavy boxes at times.    Employer: [employer]  Tobacco Use  . Smoking status: Former Smoker    Packs/day: 0.50    Years: 40.00    Pack years: 20.00    Types: Cigarettes    Quit date: 03/31/2017    Years since quitting: 2.3  . Smokeless tobacco: Never Used  Substance and Sexual Activity  . Alcohol use: No  . Drug use: No  . Sexual activity: Not Currently  Other Topics Concern  . Not on file  Social History Narrative  . Not on file   Social Determinants of Health   Financial Resource Strain:   . Difficulty of Paying Living Expenses:   Food Insecurity:   . Worried About Charity fundraiser in the Last Year:   . Arboriculturist in the Last Year:   Transportation Needs:   . Film/video editor (Medical):   Kayla Wyatt Lack of Transportation (Non-Medical):   Physical Activity:   . Days of Exercise per Week:   . Minutes of  Exercise per Session:   Stress:   . Feeling of Stress :   Social Connections:   . Frequency of Communication with Friends and Family:   . Frequency of Social Gatherings with Friends and Family:   . Attends Religious Services:   . Active Member of Clubs or Organizations:   . Attends Archivist Meetings:   Kayla Wyatt Marital Status:   Intimate Partner Violence:   . Fear of Current or Ex-Partner:   . Emotionally Abused:   Kayla Wyatt Physically Abused:   . Sexually Abused:     FAMILY HISTORY: Family History  Problem Relation Age of Onset  . Heart disease Father     ALLERGIES:  has No Known Allergies.  MEDICATIONS:  Current Outpatient Medications  Medication Sig Dispense Refill  .  acetaminophen (TYLENOL) 325 MG tablet Take 1-2 tablets (325-650 mg total) by mouth every 4 (four) hours as needed for mild pain.    . budesonide-formoterol (SYMBICORT) 80-4.5 MCG/ACT inhaler Inhale 2 puffs into the lungs 2 (two) times daily. 1 Inhaler 12  . cholecalciferol (VITAMIN D) 1000 units tablet Take 1,000 Units by mouth 2 (two) times daily.    . clotrimazole-betamethasone (LOTRISONE) cream Apply 1 application topically 2 (two) times daily. To rash on upper and lower extremities 45 g 0  . levETIRAcetam (KEPPRA) 500 MG tablet TK 1 T PO BID  6  . levothyroxine (SYNTHROID) 88 MCG tablet TAKE 1 TABLET(88 MCG) BY MOUTH DAILY 30 tablet 0  . lidocaine-prilocaine (EMLA) cream Apply to affected area once 30 g 3  . lisinopril-hydrochlorothiazide (PRINZIDE,ZESTORETIC) 20-12.5 MG tablet Take 1 tablet by mouth daily.    . ondansetron (ZOFRAN) 8 MG tablet Take 1 tablet (8 mg total) by mouth 2 (two) times daily as needed for refractory nausea / vomiting. Start on day 3 after chemo. 30 tablet 1  . polycarbophil (FIBERCON) 625 MG tablet Take 2 tablets (1,250 mg total) by mouth 2 (two) times daily. 120 tablet 0  . saccharomyces boulardii (FLORASTOR) 250 MG capsule Take 250 mg by mouth 2 (two) times daily.    Kayla Wyatt triamcinolone  cream (KENALOG) 0.1 % APPLY THIN LAYER TO THE AFFECTED AREA TWICE DAILY    . apixaban (ELIQUIS) 5 MG TABS tablet Take 1 tablet (5 mg total) by mouth daily. (Patient not taking: Reported on 07/26/2019) 30 tablet 1  . LORazepam (ATIVAN) 0.5 MG tablet Take 2 tablets (1 mg total) by mouth once as needed for up to 1 dose (prior to PET/CT and MRI brain for anxiety). (Patient not taking: Reported on 07/26/2019) 6 tablet 0  . pantoprazole (PROTONIX) 40 MG tablet TK 1 T PO BID  6   No current facility-administered medications for this visit.   Facility-Administered Medications Ordered in Other Visits  Medication Dose Route Frequency Provider Last Rate Last Admin  . sodium chloride flush (NS) 0.9 % injection 10 mL  10 mL Intracatheter PRN Brunetta Genera, MD   10 mL at 08/10/18 1558    REVIEW OF SYSTEMS:   A 10+ POINT REVIEW OF SYSTEMS WAS OBTAINED including neurology, dermatology, psychiatry, cardiac, respiratory, lymph, extremities, GI, GU, Musculoskeletal, constitutional, breasts, reproductive, HEENT.  All pertinent positives are noted in the HPI.  All others are negative.   PHYSICAL EXAMINATION: ECOG FS:1 - Symptomatic but completely ambulatory  Vitals:   08/03/19 1145  BP: 132/60  Pulse: 100  Resp: 20  Temp: 97.8 F (36.6 C)  SpO2: 100%   Wt Readings from Last 3 Encounters:  08/03/19 162 lb 14.4 oz (73.9 kg)  07/26/19 162 lb 1.6 oz (73.5 kg)  05/11/19 166 lb 14.4 oz (75.7 kg)   Body mass index is 28.86 kg/m.    GENERAL:alert, in no acute distress and comfortable SKIN: no acute rashes, no significant lesions EYES: conjunctiva are pink and non-injected, sclera anicteric OROPHARYNX: MMM, no exudates, no oropharyngeal erythema or ulceration NECK: supple, no JVD LYMPH:  no palpable lymphadenopathy in the cervical, axillary or inguinal regions LUNGS: clear to auscultation b/l with normal respiratory effort HEART: regular rate & rhythm ABDOMEN:  normoactive bowel sounds , non  tender, not distended. No palpable hepatosplenomegaly.  Extremity: no pedal edema PSYCH: alert & oriented x 3 with fluent speech NEURO: no focal motor/sensory deficits  LABORATORY DATA:  I have reviewed the data  as listed  . CBC Latest Ref Rng & Units 07/27/2019 05/11/2019 02/10/2019  WBC 4.0 - 10.5 K/uL 9.0 6.4 7.4  Hemoglobin 12.0 - 15.0 g/dL 9.8(L) 11.2(L) 10.9(L)  Hematocrit 36.0 - 46.0 % 30.3(L) 34.5(L) 34.1(L)  Platelets 150 - 400 K/uL 282 292 247    CMP Latest Ref Rng & Units 07/27/2019 05/11/2019 02/10/2019  Glucose 70 - 99 mg/dL 98 84 128(H)  BUN 8 - 23 mg/dL 20 25(H) 29(H)  Creatinine 0.44 - 1.00 mg/dL 0.90 1.25(H) 1.12(H)  Sodium 135 - 145 mmol/L 141 145 145  Potassium 3.5 - 5.1 mmol/L 3.8 4.3 3.9  Chloride 98 - 111 mmol/L 105 107 110  CO2 22 - 32 mmol/L '26 27 25  '$ Calcium 8.9 - 10.3 mg/dL 9.2 10.1 9.4  Total Protein 6.5 - 8.1 g/dL 6.7 7.1 7.2  Total Bilirubin 0.3 - 1.2 mg/dL 0.2(L) 0.2(L) 0.2(L)  Alkaline Phos 38 - 126 U/L 69 64 74  AST 15 - 41 U/L '18 23 18  '$ ALT 0 - 44 U/L '13 12 15  '$ 01/26/2019 CT Abdomen Pelvis W Contrast (Accession [id number])    01/26/2019 CT Chest W Contrast (Accession [id number])   05/19/17 Fine Needle Aspiration:  05/19/17 Bronchial Washing Specimen B:   RADIOGRAPHIC STUDIES: I have personally reviewed the radiological images as listed and agreed with the findings in the report. CT Chest W Contrast  Result Date: 07/27/2019 CLINICAL DATA:  Non-small cell lung cancer, post chemo radiotherapy EXAM: CT CHEST WITH CONTRAST TECHNIQUE: Multidetector CT imaging of the chest was performed during intravenous contrast administration. CONTRAST:  80m OMNIPAQUE IOHEXOL 300 MG/ML  SOLN COMPARISON:  01/26/2019 hip down RIGHT IJ Port-A-Cath terminating at the caval to atrial junction. FINDINGS: Cardiovascular: Calcified atheromatous plaque in the thoracic aorta. No aneurysmal dilation. Moderate atheromatous changes as before. No pericardial effusion. Heart size  mildly enlarged. Central pulmonary vessels unchanged. Mediastinum/Nodes: Thoracic inlet structures without adenopathy. No axillary lymphadenopathy. No mediastinal lymphadenopathy. No hilar lymphadenopathy. Lungs/Pleura: Post treatment changes about the LEFT hilum similar to prior study. Peripheral upper lobe nodularity is similar. Slight increasing nodularity along the major fissure adjacent to the LEFT hilum. Nodularity along the pleural surface in the posterior LEFT chest with increase, focal area of nodularity measures 13 x 10 mm, previously 10 x 7 mm (image 63, series 7) Background centrilobular and paraseptal emphysema is unchanged. No pleural effusion. Airways are patent. Upper Abdomen: Incidental imaging of upper abdominal contents is unremarkable aside from postoperative changes about the stomach, partially imaged. No acute upper abdominal process. Adrenal glands are normal. Musculoskeletal: Spinal degenerative changes. No acute or destructive bone process. IMPRESSION: 1. Post treatment changes about the LEFT hilum. 2. Nodularity along the pleural surface in the posterior LEFT chest with increase, raising the question of metastasis or metachronous primary, particularly given the presence of subtle nodular ground-glass in this area in February of 2020, increasing slightly over time. PET scan may be helpful for further assessment. 3. Slight increasing nodularity above the LEFT hilum along the major fissure potentially evolving post treatment changes but given other findings suggest attention to this area on PET evaluation. 4. Emphysema and aortic atherosclerosis. Aortic Atherosclerosis (ICD10-I70.0) and Emphysema (ICD10-J43.9). Electronically Signed   By: GZetta BillsM.D.   On: 07/27/2019 14:56   MRI brain 04/21/2017: IMPRESSION: 1. Motion degraded examination. 2. 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 3. Otherwise negative MRI of the head with  and without contrast for age.  Electronically Signed   By:  Elon Alas M.D.   On: 04/22/2017 00:34    ASSESSMENT & PLAN:   64 y.o. female with  1.  Non-Small Cell Carcinoma atleast Stage IIIB  Not enough tissue for further characterization or mutation testing. Presenting with mediastinal nodal mass - TxN2 Mx (Atleast Stage III disease) ? Lingular primary vs inflammation. No overt evidence of metastatic disease.  -EGFR mutation studies on blood negative. Patient is s/p 3 cycles of Carboplatin/taxol -resolution left upper lobe ground-glass opacity has resolved in the interval. 08/19/17 PET revealed  No significant change in size of hypermetabolic AP window mass. The degree of FDG uptake is slightly increased in the interval. 2. Persistent hypermetabolic focus involving the distal aspect of the tongue within SUV max of 16.46. Similar to previous exam. (no clinical co-relate). Lung involvement has decreased.   08/19/17 Brain MRI revealed No evidence of intracranial metastases. Interval resolution of bilateral parietal signal abnormality favoring PRES  Completed concurrent Carbo/taxol chemotherapy + RT  12/24/17 PET/CT revealed Marked interval response to therapy with large AP window lymph nodes seen on the previous study almost imperceptible on today's exam. Hypermetabolism within this lesion has resolved with FDG accumulation now at background blood pool levels. 2. Small focus of hypermetabolism in the anal region, similar to prior and indeterminate by PET imaging. 3. Hypermetabolic focus at the distal tongue has decreased in the Interval. 4. Marked pelvic floor laxity with cystocele. 5. Stable tiny left lung nodules. 6.  Aortic Atherosclerois. 7.  Emphysema.   04/03/17 endoscopy did not reveal esophageal thickening    04/27/18 CT Chest revealed Stable exam. No new or progressive findings. 2. Stable mild circumferential wall thickening mid esophagus. Esophagitis is a consideration. 3.  Stable tiny left pulmonary nodules. 4.  Emphysema. 5.  Aortic Atherosclerois.  01/26/2019 CT Abdomen Pelvis W Contrast (Accession [id number]) revealed "1. Patchy areas of consolidation, bronchiectasis and ground-glass in the medial left hemithorax, presumably treatment related. No evidence of metastatic disease.2. Extensive debris in the mainstem bronchi, bronchus intermedius and left upper lobe bronchus."    2. Bone Pain from neulasta - managed with Tylenol  3. Previous h/ot SZ -currently on Keppra. MRI brain - resolution of findings of PRES  4. H/o rt calf DVT - off anticoagulation due to massive GI bleeding. S/p IVC filter. No issues currently  5. S/p previous GI bleeding from Gastric ulcer --needing clipping and arterial embolization. On PPI BID Ulcers seen to be healed with recent April 2020 repeat endoscopy  6. H/o ctx /steroid related thrush --now resolved.  8. Elevated TSH/Hypothyroidism -- ? Related to immunotherapy 07/13/18 Thyroid functions revealed TSH at 62.91 and Free T4 at 0.30 Began low dose 3mg Levothyroxine and provide endocrinology referral for further optimization. Recommend taking Levothyroxine 1.5 hours before to breakfast with only water and no other medications.  F/u with Dr. SKelton Pill[facility]   PLAN: -Discussed pt labwork, 07/27/19; increasing anemia, blood chemistries are stable, TSH is WNL -Discussed 07/27/2019 CT Chest ([id number] which revealed "Nodularity along the pleural surface in the posterior left chest." -Advised pt that the CT was not definitive. Nodularity could be caused by radiation changes or recurrent lung metastatic cancer/metasynchronous lung cancer. Would recommend PET/CT scan. -Some concern for recurrence of pt's Non-Small Cell Carcinoma -Recommend pt receive COVID19 vaccine, as she has not had them yet. Pt declines at this time.  -Continue 88 mcg Synthroid daily -Will get PET/CT in 7 days -Will see back in 2 weeks via phone   FOLLOW UP: PET/CT in  1 week Phone visit with Dr Irene Limbo in about 2 weeks   The total time spent in the appt was 20 minutes and more than 50% was on counseling and direct patient cares.  All of the patient's questions were answered with apparent satisfaction. The patient knows to call the clinic with any problems, questions or concerns.   Sullivan Lone MD [place] AAHIVMS [facility] Hematology/Oncology Physician [facility]  (Office):       [phone] (Work cell):  [phone] (Fax):           [phone]  08/05/2019 10:06 AM  I, [name], am acting as a scribe for Dr. Sullivan Lone.   .I have reviewed the above documentation for accuracy and completeness, and I agree with the above. Brunetta Genera MD

## 2019-08-04 ENCOUNTER — Telehealth: Payer: Self-pay | Admitting: Hematology

## 2019-08-04 NOTE — Telephone Encounter (Signed)
Scheduled per los, called patient and patient is notified.

## 2019-08-10 ENCOUNTER — Emergency Department (HOSPITAL_COMMUNITY)
Admission: EM | Admit: 2019-08-10 | Discharge: 2019-08-11 | Disposition: A | Discharge status: Home / Self Care | Payer: PPO | Attending: Emergency Medicine | Admitting: Emergency Medicine

## 2019-08-10 ENCOUNTER — Other Ambulatory Visit: Payer: Self-pay

## 2019-08-10 ENCOUNTER — Encounter (HOSPITAL_COMMUNITY)
Admission: EM | Disposition: A | Discharge status: Home / Self Care | Payer: Self-pay | Source: Home / Self Care | Attending: Emergency Medicine

## 2019-08-10 ENCOUNTER — Encounter (HOSPITAL_COMMUNITY): Payer: Self-pay

## 2019-08-10 DIAGNOSIS — Z87891 Personal history of nicotine dependence: Secondary | ICD-10-CM | POA: Diagnosis not present

## 2019-08-10 DIAGNOSIS — Y9389 Activity, other specified: Secondary | ICD-10-CM | POA: Diagnosis not present

## 2019-08-10 DIAGNOSIS — R1310 Dysphagia, unspecified: Secondary | ICD-10-CM | POA: Diagnosis not present

## 2019-08-10 DIAGNOSIS — K222 Esophageal obstruction: Secondary | ICD-10-CM

## 2019-08-10 DIAGNOSIS — Z20822 Contact with and (suspected) exposure to covid-19: Secondary | ICD-10-CM | POA: Insufficient documentation

## 2019-08-10 DIAGNOSIS — T18128A Food in esophagus causing other injury, initial encounter: Secondary | ICD-10-CM | POA: Diagnosis not present

## 2019-08-10 DIAGNOSIS — K219 Gastro-esophageal reflux disease without esophagitis: Secondary | ICD-10-CM | POA: Diagnosis not present

## 2019-08-10 DIAGNOSIS — X58XXXA Exposure to other specified factors, initial encounter: Secondary | ICD-10-CM | POA: Diagnosis not present

## 2019-08-10 DIAGNOSIS — Z03818 Encounter for observation for suspected exposure to other biological agents ruled out: Secondary | ICD-10-CM | POA: Diagnosis not present

## 2019-08-10 DIAGNOSIS — I10 Essential (primary) hypertension: Secondary | ICD-10-CM | POA: Insufficient documentation

## 2019-08-10 DIAGNOSIS — Y999 Unspecified external cause status: Secondary | ICD-10-CM | POA: Diagnosis not present

## 2019-08-10 DIAGNOSIS — Y929 Unspecified place or not applicable: Secondary | ICD-10-CM | POA: Diagnosis not present

## 2019-08-10 DIAGNOSIS — Z79899 Other long term (current) drug therapy: Secondary | ICD-10-CM | POA: Insufficient documentation

## 2019-08-10 HISTORY — PX: FOREIGN BODY REMOVAL: SHX962

## 2019-08-10 HISTORY — PX: ESOPHAGOGASTRODUODENOSCOPY: SHX5428

## 2019-08-10 LAB — SARS CORONAVIRUS 2 BY RT PCR (HOSPITAL ORDER, PERFORMED IN ~~LOC~~ HOSPITAL LAB): SARS Coronavirus 2: NEGATIVE

## 2019-08-10 SURGERY — EGD (ESOPHAGOGASTRODUODENOSCOPY)
Anesthesia: Moderate Sedation

## 2019-08-10 SURGERY — EGD (ESOPHAGOGASTRODUODENOSCOPY)
Anesthesia: Moderate Sedation | Laterality: Left

## 2019-08-10 MED ORDER — MIDAZOLAM HCL (PF) 10 MG/2ML IJ SOLN
INTRAMUSCULAR | Status: DC | PRN
  Administered 2019-08-10 (×3): 2 mg via INTRAVENOUS

## 2019-08-10 MED ORDER — MIDAZOLAM HCL (PF) 5 MG/ML IJ SOLN
INTRAMUSCULAR | Status: AC
  Filled 2019-08-10: qty 2

## 2019-08-10 MED ORDER — SODIUM CHLORIDE 0.9 % IV SOLN: INTRAVENOUS | Status: DC

## 2019-08-10 MED ORDER — FENTANYL CITRATE (PF) 100 MCG/2ML IJ SOLN
INTRAMUSCULAR | Status: DC | PRN
  Administered 2019-08-10 (×5): 25 ug via INTRAVENOUS

## 2019-08-10 MED ORDER — FENTANYL CITRATE (PF) 100 MCG/2ML IJ SOLN
INTRAMUSCULAR | Status: AC
  Filled 2019-08-10: qty 4

## 2019-08-10 MED ORDER — DIPHENHYDRAMINE HCL 50 MG/ML IJ SOLN
INTRAMUSCULAR | Status: AC
  Filled 2019-08-10: qty 1

## 2019-08-10 NOTE — ED Provider Notes (Signed)
[facility] DEPT Provider Note   CSN: [id number] Arrival date & time: 08/10/19  2038     History Chief Complaint  Patient presents with  . food bolus    Kayla Wyatt is a 64 y.o. female.  64 year old female presents with concern for food bolus.  Patient does have a history of esophageal stricture in the past secondary to radiation therapy.  States that several hours ago she was attempting to swallow a hot dog when she fell it gets stuck.  Since that time she has not been able to swallow her spit.  Every time she tries to swallow liquid it comes back up.  No treatment used for this prior to arrival        Past Medical History:  Diagnosis Date  . Acute blood loss anemia 04/02/2017  . Anxiety   . Gastrointestinal hemorrhage with melena   . Headache   . Hypercholesteremia   . Hypertension   . lung ca dx'd 05/2017  . Persistent atrial fibrillation with rapid ventricular response ([place]) 04/07/2017  . Seizures ([place])    04/2017 while in hospital at [facility]  . Vitamin D deficiency     Patient Active Problem List   Diagnosis Date Noted  . Non-small cell lung cancer (NSCLC) ([place]) 01/18/2018  . Port-A-Cath in place 09/25/2017  . Counseling regarding advance care planning and goals of care 06/21/2017  . Primary malignant neoplasm of mediastinum ([place]) 06/05/2017  . Debility   . Diarrhea   . Acute gastric ulcer   . Hypoxia   . Hypoalbuminemia due to protein-calorie malnutrition ([place])   . Hypomagnesemia   . Hyperglycemia   . Critical illness myopathy 04/22/2017  . PRES (posterior reversible encephalopathy syndrome)   . Acute deep vein thrombosis (DVT) of right lower extremity ([place])   . Pressure injury of sacral region, unstageable ([place])   . Gastrointestinal hemorrhage with hematemesis   . Mediastinal mass   . Hypokalemia   . Hypocalcemia   . Supplemental oxygen dependent   . Encounter for intubation   . Respiratory failure ([place])   . Persistent  atrial fibrillation with rapid ventricular response ([place]) 04/07/2017  . Gastrointestinal hemorrhage with melena   . Lower abdominal pain of unknown etiology   . Abnormal computed tomography angiography (CTA) of abdomen and pelvis   . Acute blood loss anemia 04/02/2017    Past Surgical History:  Procedure Laterality Date  . COLONOSCOPY Left 04/09/2017   Procedure: COLONOSCOPY;  Surgeon: Carol Ada, MD;  Location: [place];  Service: Endoscopy;  Laterality: Left;  . ESOPHAGOGASTRODUODENOSCOPY N/A 04/03/2017   Procedure: ESOPHAGOGASTRODUODENOSCOPY (EGD);  Surgeon: Carol Ada, MD;  Location: [place];  Service: Endoscopy;  Laterality: N/A;  . ESOPHAGOGASTRODUODENOSCOPY (EGD) WITH PROPOFOL N/A 04/12/2017   Procedure: ESOPHAGOGASTRODUODENOSCOPY (EGD) WITH PROPOFOL;  Surgeon: Milus Banister, MD;  Location: [facility] ENDOSCOPY;  Service: Endoscopy;  Laterality: N/A;  . IR ANGIOGRAM SELECTIVE EACH ADDITIONAL VESSEL  04/12/2017  . IR ANGIOGRAM SELECTIVE EACH ADDITIONAL VESSEL  04/13/2017  . IR ANGIOGRAM SELECTIVE EACH ADDITIONAL VESSEL  04/13/2017  . IR ANGIOGRAM SELECTIVE EACH ADDITIONAL VESSEL  04/13/2017  . IR ANGIOGRAM VISCERAL SELECTIVE  04/12/2017  . IR ANGIOGRAM VISCERAL SELECTIVE  04/13/2017  . IR EMBO ART  VEN HEMORR LYMPH EXTRAV  INC GUIDE ROADMAPPING  04/12/2017  . IR EMBO ART  VEN HEMORR LYMPH EXTRAV  INC GUIDE ROADMAPPING  04/13/2017  . IR FLUORO GUIDE CV LINE RIGHT  04/13/2017  . IR FLUORO GUIDE  PORT INSERTION RIGHT  07/27/2017  . IR IVC FILTER PLMT / S&I /IMG GUID/MOD SED  04/12/2017  . IR US GUIDE VASC ACCESS RIGHT  04/12/2017  . IR US GUIDE VASC ACCESS RIGHT  04/13/2017  . IR US GUIDE VASC ACCESS RIGHT  04/13/2017  . IR US GUIDE VASC ACCESS RIGHT  07/27/2017  . LEG SURGERY  1974   Blood Clot Removal   . VIDEO BRONCHOSCOPY WITH ENDOBRONCHIAL ULTRASOUND N/A 05/19/2017   Procedure: VIDEO BRONCHOSCOPY WITH ENDOBRONCHIAL ULTRASOUND;  Surgeon: Grace Isaac, MD;  Location: [place];  Service: Thoracic;   Laterality: N/A;     OB History   No obstetric history on file.     Family History  Problem Relation Age of Onset  . Heart disease Father     Social History   Tobacco Use  . Smoking status: Former Smoker    Packs/day: 0.50    Years: 40.00    Pack years: 20.00    Types: Cigarettes    Quit date: 03/31/2017    Years since quitting: 2.3  . Smokeless tobacco: Never Used  Substance Use Topics  . Alcohol use: No  . Drug use: No    Home Medications Prior to Admission medications   Medication Sig Start Date End Date Taking? Authorizing Provider  acetaminophen (TYLENOL) 325 MG tablet Take 1-2 tablets (325-650 mg total) by mouth every 4 (four) hours as needed for mild pain. 04/30/17   Love, Ivan Anchors, PA-C  apixaban (ELIQUIS) 5 MG TABS tablet Take 1 tablet (5 mg total) by mouth daily. Patient not taking: Reported on 07/26/2019 12/29/18   Harle Stanford., PA-C  budesonide-formoterol [facility]) 80-4.5 MCG/ACT inhaler Inhale 2 puffs into the lungs 2 (two) times daily. 04/30/17   Love, Ivan Anchors, PA-C  cholecalciferol (VITAMIN D) 1000 units tablet Take 1,000 Units by mouth 2 (two) times daily.    [provider]  clotrimazole-betamethasone (LOTRISONE) cream Apply 1 application topically 2 (two) times daily. To rash on upper and lower extremities 02/10/19   Brunetta Genera, MD  levETIRAcetam (KEPPRA) 500 MG tablet TK 1 T PO BID 06/02/17   [provider]  levothyroxine (SYNTHROID) 88 MCG tablet TAKE 1 TABLET(88 MCG) BY MOUTH DAILY 05/12/19   Shamleffer, Melanie Crazier, MD  lidocaine-prilocaine (EMLA) cream Apply to affected area once 01/18/18   Brunetta Genera, MD  lisinopril-hydrochlorothiazide (PRINZIDE,ZESTORETIC) 20-12.5 MG tablet Take 1 tablet by mouth daily.    [provider]  LORazepam (ATIVAN) 0.5 MG tablet Take 2 tablets (1 mg total) by mouth once as needed for up to 1 dose (prior to PET/CT and MRI brain for anxiety). Patient not taking: Reported on  07/26/2019 04/20/18   Brunetta Genera, MD  ondansetron (ZOFRAN) 8 MG tablet Take 1 tablet (8 mg total) by mouth 2 (two) times daily as needed for refractory nausea / vomiting. Start on day 3 after chemo. 06/21/17   Brunetta Genera, MD  pantoprazole (PROTONIX) 40 MG tablet TK 1 T PO BID 06/02/17   [provider]  polycarbophil (FIBERCON) 625 MG tablet Take 2 tablets (1,250 mg total) by mouth 2 (two) times daily. 04/30/17   Love, Ivan Anchors, PA-C  saccharomyces boulardii (FLORASTOR) 250 MG capsule Take 250 mg by mouth 2 (two) times daily.    [provider]  triamcinolone cream (KENALOG) 0.1 % APPLY THIN LAYER TO THE AFFECTED AREA TWICE DAILY 04/15/19   [provider]    Allergies    Patient  has no known allergies.  Review of Systems   Review of Systems  All other systems reviewed and are negative.   Physical Exam Updated Vital Signs BP (!) 125/58 (BP Location: Left Arm)   Pulse 87   Temp 98.2 F (36.8 C) (Oral)   Resp (!) 22   SpO2 94%   Physical Exam Vitals and nursing note reviewed.  Constitutional:      General: She is not in acute distress.    Appearance: Normal appearance. She is well-developed. She is not toxic-appearing.  HENT:     Head: Normocephalic and atraumatic.  Eyes:     General: Lids are normal.     Conjunctiva/sclera: Conjunctivae normal.     Pupils: Pupils are equal, round, and reactive to light.  Neck:     Thyroid: No thyroid mass.     Trachea: No tracheal deviation.  Cardiovascular:     Rate and Rhythm: Normal rate and regular rhythm.     Heart sounds: Normal heart sounds. No murmur. No gallop.   Pulmonary:     Effort: Pulmonary effort is normal. No respiratory distress.     Breath sounds: Normal breath sounds. No stridor. No decreased breath sounds, wheezing, rhonchi or rales.  Abdominal:     General: Bowel sounds are normal. There is no distension.     Palpations: Abdomen is soft.     Tenderness: There is no abdominal  tenderness. There is no rebound.  Musculoskeletal:        General: No tenderness. Normal range of motion.     Cervical back: Normal range of motion and neck supple.  Skin:    General: Skin is warm and dry.     Findings: No abrasion or rash.  Neurological:     Mental Status: She is alert and oriented to person, place, and time.     GCS: GCS eye subscore is 4. GCS verbal subscore is 5. GCS motor subscore is 6.     Cranial Nerves: No cranial nerve deficit.     Sensory: No sensory deficit.  Psychiatric:        Speech: Speech normal.        Behavior: Behavior normal.     ED Results / Procedures / Treatments   Labs (all labs ordered are listed, but only abnormal results are displayed) Labs Reviewed  SARS CORONAVIRUS 2 BY RT PCR (HOSPITAL ORDER, [facility])    EKG None  Radiology No results found.  Procedures Procedures (including critical care time)  Medications Ordered in ED Medications - No data to display  ED Course  I have reviewed the triage vital signs and the nursing notes.  Pertinent labs & imaging results that were available during my care of the patient were reviewed by me and considered in my medical decision making (see chart for details).    MDM Rules/Calculators/A&P                     Pt gave water here and spit it back up Spoke with dr Collene Mares who will come to see pt Final Clinical Impression(s) / ED Diagnoses Final diagnoses:  None    Rx / DC Orders ED Discharge Orders    None       Lacretia Leigh, MD 08/10/19 2157

## 2019-08-10 NOTE — Consult Note (Signed)
Reason for consultation: Food impaction. Referring physician: Dr. Emmaline Kluver MD History of present illness: [name] is a 64 year old white female with multiple medical problems listed below who is had radiation treatment for lung cancer in 2019 presents to the emergency room today at [facility] with hot dog stuck in her esophagus.  On review of her previous records she had endoscopy done by Dr. Carol Ada on 06/23/2018 when radiation stricture was noted from 23 to 27 cm and dilated with savory dilators from 14 mm to 17 mm without incident.  Patient apparently has done well since that time till she developed a food impaction today.  She denies any hematemesis or coffee-ground emesis.  There is no history of abdominal pain nausea vomiting.;  She had some chicken noodle soup at noon today and was able to swallow that without any difficulty but when she took 1 bite of a hot dog this evening at about 5 PM she was not able to swallow the meat bolus and since then has not been able to swallow any liquids either.   Past Medical History:  Diagnosis Date  . Acute blood loss anemia 04/02/2017  . Anxiety   . Gastrointestinal hemorrhage with melena   . Headache   . Hypercholesteremia   . Hypertension   . lung ca dx'd 05/2017  . Persistent atrial fibrillation with rapid ventricular response ([place]) 04/07/2017  . Seizures ([place])    04/2017 while in hospital at [facility]  . Vitamin D deficiency    Patient Active Problem List   Diagnosis Date Noted  . Non-small cell lung cancer (NSCLC) ([place]) 01/18/2018  . Port-A-Cath in place 09/25/2017  . Counseling regarding advance care planning and goals of care 06/21/2017  . Primary malignant neoplasm of mediastinum ([place]) 06/05/2017  . Debility   . Diarrhea   . Acute gastric ulcer   . Hypoxia   . Hypoalbuminemia due to protein-calorie malnutrition ([place])   . Hypomagnesemia   . Hyperglycemia   . Critical illness myopathy 04/22/2017  . PRES  (posterior reversible encephalopathy syndrome)   . Acute deep vein thrombosis (DVT) of right lower extremity ([place])   . Pressure injury of sacral region, unstageable ([place])   . Gastrointestinal hemorrhage with hematemesis   . Mediastinal mass   . Hypokalemia   . Hypocalcemia   . Supplemental oxygen dependent   . Encounter for intubation   . Respiratory failure ([place])   . Persistent atrial fibrillation with rapid ventricular response ([place]) 04/07/2017  . Gastrointestinal hemorrhage with melena   . Lower abdominal pain of unknown etiology   . Abnormal computed tomography angiography (CTA) of abdomen and pelvis   . Acute blood loss anemia    Patient Active Problem List   Diagnosis Date Noted  . Non-small cell lung cancer (NSCLC) ([place]) 01/18/2018  . Port-A-Cath in place 09/25/2017  . Counseling regarding advance care planning and goals of care 06/21/2017  . Primary malignant neoplasm of mediastinum ([place]) 06/05/2017  . Debility   . Diarrhea   . Acute gastric ulcer   . Hypoxia   . Hypoalbuminemia due to protein-calorie malnutrition ([place])   . Hypomagnesemia   . Hyperglycemia   . Critical illness myopathy 04/22/2017  . PRES (posterior reversible encephalopathy syndrome)   . Acute deep vein thrombosis (DVT) of right lower extremity ([place])   . Pressure injury of sacral region, unstageable ([place])   . Gastrointestinal hemorrhage with hematemesis   . Mediastinal mass   . Hypokalemia   .  Hypocalcemia   . Supplemental oxygen dependent   . Encounter for intubation   . Respiratory failure ([place])   . Persistent atrial fibrillation with rapid ventricular response ([place]) 04/07/2017  . Gastrointestinal hemorrhage with melena   . Lower abdominal pain of unknown etiology   . Abnormal computed tomography angiography (CTA) of abdomen and pelvis   . Acute blood loss anemia    Past Surgical History:  Procedure Laterality Date  . COLONOSCOPY Left 04/09/2017   Procedure:  COLONOSCOPY;  Surgeon: Carol Ada, MD;  Location: [facility];  Service: Endoscopy;  Laterality: Left;  . ESOPHAGOGASTRODUODENOSCOPY N/A 04/03/2017   Procedure: ESOPHAGOGASTRODUODENOSCOPY (EGD);  Surgeon: Carol Ada, MD;  Location: [facility];  Service: Endoscopy;  Laterality: N/A;  . ESOPHAGOGASTRODUODENOSCOPY (EGD) WITH PROPOFOL N/A 04/12/2017   Procedure: ESOPHAGOGASTRODUODENOSCOPY (EGD) WITH PROPOFOL;  Surgeon: Milus Banister, MD;  Location: [facility] ENDOSCOPY;  Service: Endoscopy;  Laterality: N/A;  . IR ANGIOGRAM SELECTIVE EACH ADDITIONAL VESSEL  04/12/2017  . IR ANGIOGRAM SELECTIVE EACH ADDITIONAL VESSEL  04/13/2017  . IR ANGIOGRAM SELECTIVE EACH ADDITIONAL VESSEL  04/13/2017  . IR ANGIOGRAM SELECTIVE EACH ADDITIONAL VESSEL  04/13/2017  . IR ANGIOGRAM VISCERAL SELECTIVE  04/12/2017  . IR ANGIOGRAM VISCERAL SELECTIVE  04/13/2017  . IR EMBO ART  VEN HEMORR LYMPH EXTRAV  INC GUIDE ROADMAPPING  04/12/2017  . IR EMBO ART  VEN HEMORR LYMPH EXTRAV  INC GUIDE ROADMAPPING  04/13/2017  . IR FLUORO GUIDE CV LINE RIGHT  04/13/2017  . IR FLUORO GUIDE PORT INSERTION RIGHT  07/27/2017  . IR IVC FILTER PLMT / S&I /IMG GUID/MOD SED  04/12/2017  . IR US GUIDE VASC ACCESS RIGHT  04/12/2017  . IR US GUIDE VASC ACCESS RIGHT  04/13/2017  . IR US GUIDE VASC ACCESS RIGHT  04/13/2017  . IR US GUIDE VASC ACCESS RIGHT  07/27/2017  . LEG SURGERY  1974   Blood Clot Removal   . VIDEO BRONCHOSCOPY WITH ENDOBRONCHIAL ULTRASOUND N/A 05/19/2017   Procedure: VIDEO BRONCHOSCOPY WITH ENDOBRONCHIAL ULTRASOUND;  Surgeon: Grace Isaac, MD;  Location: [facility];  Service: Thoracic;  Laterality: N/A;               OB History   No obstetric history on file.          Family History  Problem Relation Age of Onset  . Heart disease Father     Social History        Tobacco Use  . Smoking status: Former Smoker    Packs/day: 0.50    Years: 40.00    Pack years: 20.00    Types: Cigarettes    Quit date:  03/31/2017    Years since quitting: 2.3  . Smokeless tobacco: Never Used  Substance Use Topics  . Alcohol use: No  . Drug use: No   Home Medications         Prior to Admission medications   Sig Start Date End Date Taking? Authorizing Provider  Take 1-2 tablets (325-650 mg total) by mouth every 4 (four) hours as needed for mild pain. 04/30/17   Bary Leriche, PA-C  Take 1 tablet (5 mg total) by mouth daily.  Patient not taking: Reported on 07/26/2019 12/29/18   Harle Stanford., PA-C  Inhale 2 puffs into the lungs 2 (two) times daily. 04/30/17   Bary Leriche, PA-C  Take 1,000 Units by mouth 2 (two) times daily.    [provider]  Apply 1 application topically 2 (two) times  daily. To rash on upper and lower extremities 02/10/19   Brunetta Genera, MD  TK 1 T PO BID 06/02/17   [provider]  TAKE 1 TABLET(88 MCG) BY MOUTH DAILY 05/12/19   Shamleffer, Melanie Crazier, MD  Apply to affected area once 01/18/18   Brunetta Genera, MD  Take 1 tablet by mouth daily.    [provider]  Take 2 tablets (1 mg total) by mouth once as needed for up to 1 dose (prior to PET/CT and MRI brain for anxiety).  Patient not taking: Reported on 07/26/2019 04/20/18   Brunetta Genera, MD  Take 1 tablet (8 mg total) by mouth 2 (two) times daily as needed for refractory nausea / vomiting. Start on day 3 after chemo. 06/21/17   Brunetta Genera, MD  TK 1 T PO BID 06/02/17   [provider]  Take 2 tablets (1,250 mg total) by mouth 2 (two) times daily. 04/30/17   Bary Leriche, PA-C  Take 250 mg by mouth 2 (two) times daily.    [provider]  APPLY THIN LAYER TO THE AFFECTED AREA TWICE DAILY 04/15/19   [provider]                         None     General physical exam; vital signs are stable patient does not seem to be in any acute distress she is well-developed well-nourished head is atraumatic normocephalic neck supple chest there to  auscultation S1-S2 regular abdomen soft with normal bowel sounds.   Assessment and plan-proceed with an EGD with disimpaction of food bolus at this time.  Further recommendations will be made thereafter.

## 2019-08-10 NOTE — ED Triage Notes (Signed)
Arrived POV from home. Patient reports she has hot dog stuck in her throat since 5 PM today.

## 2019-08-11 ENCOUNTER — Other Ambulatory Visit: Payer: Self-pay

## 2019-08-11 ENCOUNTER — Encounter (HOSPITAL_COMMUNITY): Payer: Self-pay | Admitting: Certified Registered Nurse Anesthetist

## 2019-08-11 ENCOUNTER — Encounter (HOSPITAL_COMMUNITY): Payer: Self-pay | Admitting: Emergency Medicine

## 2019-08-11 MED ORDER — PANTOPRAZOLE SODIUM 40 MG PO TBEC: 40.0000 mg | DELAYED_RELEASE_TABLET | Freq: Every day | ORAL | 3 refills | Status: AC

## 2019-08-11 NOTE — Discharge Instructions (Addendum)
Clear liquid diet until you follow-up with gastroenterology. Make sure that you are taking the protonix (pantoprazole) every day.

## 2019-08-11 NOTE — ED Notes (Signed)
Pt. Documented in error see above note in chart. 

## 2019-08-11 NOTE — Op Note (Signed)
[facility] Patient Name: Kayla Wyatt Procedure Date: 08/10/2019 MRN: [id number] Attending MD: Juanita Craver , MD Date of Birth: 07-Sep-1955 CSN: [id number] Age: 64 Admit Type: Emergency Department Procedure:                EGD with removal of a food impaction. Indications:              Dysphagia-food impaction, Gastro-esophageal reflux                            disease, Radiation stricture at 23-27 cm. Providers:                Juanita Craver, MD, Cherylynn Ridges, Technician , Baird Cancer, RN Referring MD:             Carol Ada, MD, Brunetta Genera, MD Medicines:                Fentanyl 125 micrograms IV, Midazolam 6 mg IV Complications:            No immediate complications. Estimated Blood Loss:     Estimated blood loss was minimal. Procedure:                Pre-Anesthesia Assessment: - Prior to the                            procedure, a history and physical was performed,                            and patient medications and allergies were                            reviewed. The patient's tolerance of previous                            anesthesia was also reviewed. The risks and                            benefits of the procedure and the sedation options                            and risks were discussed with the patient. All                            questions were answered, and informed consent was                            obtained. Prior Anticoagulants: The patient has                            taken no previous anticoagulant or antiplatelet                            agents. ASA Grade Assessment: IV - A patient with  severe systemic disease that is a constant threat                            to life. After reviewing the risks and benefits,                            the patient was deemed in satisfactory condition to                            undergo the procedure. After obtaining informed                             consent, the endoscope was passed under direct                            vision. Throughout the procedure, the patient's                            blood pressure, pulse, and oxygen saturations were                            monitored continuously. The GIF-H190 ([id number])                            Olympus gastroscope was introduced through the                            mouth, and advanced to the antrum of the stomach.                            The EGD was extremely difficult due to abnormal                            anatomy-stricture in the proximal esophagus from                            radiation in the past. Successful completion of the                            procedure was aided by increasing the dose of                            sedation medication. The patient tolerated the                            procedure well. Scope In: Scope Out: Findings:      A large piece of meat-hot dog-was noted impacted at 25 cm-a rat tooth       forceps was used to break up the meat bolus but as that did not help       much a 10 mm and a 13 mm snare were used sequentially to break up the       bolus and gently "push" the pieces of meat into the stomach; radiation  esophagitis with stricturing was was noted from 25-27 cm; the rest of       the esophagus appeared normal.      The examined stomach was normal; retroflexion was also not done.      The duodenum was not inspected. Impression:               - Large meat bolus in the upper third of the                            esophagus at 25 cm-removal was successful-radiation                            esophagitis noted from 25-27 cm with slight                            stricturing.                           - The examined stomach appeared normal.                           - The duodenum was not examined. Moderate Sedation:      Moderate (conscious) sedation was administered by the endoscopy nurse       and  supervised by the endoscopist. The following parameters were       monitored: oxygen saturation, heart rate, blood pressure, respiratory       rate and response to care. Total physician intraservice time was 24       minutes. Recommendation:           - Full liquid diet for 1 week.                           - Continue present medications.                           - Return to see Dr. [name] on 08/15/2019. Procedure Code(s):        --- Professional ---                           [phone], Esophagogastroduodenoscopy, flexible,                            transoral; with removal of foreign body(s)                           G0500, Moderate sedation services provided by the                            same physician or other qualified health care                            professional performing a gastrointestinal                            endoscopic service that sedation supports,  requiring the presence of an independent trained                            observer to assist in the monitoring of the                            patient's level of consciousness and physiological                            status; initial 15 minutes of intra-service time;                            patient age 26 years or older (additional time may                            be reported with [phone], as appropriate) Diagnosis Code(s):        --- Professional ---                           [id number], Food in esophagus causing other injury,                            initial encounter                           K21.00, Gastro-esophageal reflux disease with                            esophagitis, without bleeding                           R13.10, Dysphagia, unspecified CPT copyright 2019 [employer]. All rights reserved. The codes documented in this report are preliminary and upon coder review may  be revised to meet current compliance requirements. Juanita Craver, MD Juanita Craver,  MD 08/11/2019 12:10:01 AM This report has been signed electronically. Number of Addenda: 0

## 2019-08-12 ENCOUNTER — Encounter: Payer: Self-pay | Admitting: *Deleted

## 2019-08-15 DIAGNOSIS — R1310 Dysphagia, unspecified: Secondary | ICD-10-CM | POA: Diagnosis not present

## 2019-08-15 DIAGNOSIS — K219 Gastro-esophageal reflux disease without esophagitis: Secondary | ICD-10-CM | POA: Diagnosis not present

## 2019-08-15 DIAGNOSIS — K222 Esophageal obstruction: Secondary | ICD-10-CM | POA: Diagnosis not present

## 2019-08-17 ENCOUNTER — Other Ambulatory Visit: Payer: Self-pay

## 2019-08-17 ENCOUNTER — Ambulatory Visit (HOSPITAL_COMMUNITY)
Admission: RE | Admit: 2019-08-17 | Discharge: 2019-08-17 | Disposition: A | Discharge status: Home / Self Care | Payer: PPO | Source: Ambulatory Visit | Attending: Hematology | Admitting: Hematology

## 2019-08-17 ENCOUNTER — Inpatient Hospital Stay: Payer: PPO | Attending: Hematology | Admitting: Hematology

## 2019-08-17 VITALS — BP 133/59 | HR 81 | Temp 97.5°F | Resp 18 | Ht 63.0 in | Wt 158.4 lb

## 2019-08-17 DIAGNOSIS — N200 Calculus of kidney: Secondary | ICD-10-CM | POA: Diagnosis not present

## 2019-08-17 DIAGNOSIS — C3490 Malignant neoplasm of unspecified part of unspecified bronchus or lung: Secondary | ICD-10-CM

## 2019-08-17 DIAGNOSIS — I2510 Atherosclerotic heart disease of native coronary artery without angina pectoris: Secondary | ICD-10-CM | POA: Diagnosis not present

## 2019-08-17 DIAGNOSIS — J439 Emphysema, unspecified: Secondary | ICD-10-CM | POA: Insufficient documentation

## 2019-08-17 DIAGNOSIS — Z5111 Encounter for antineoplastic chemotherapy: Secondary | ICD-10-CM | POA: Diagnosis not present

## 2019-08-17 DIAGNOSIS — IMO0001 Reserved for inherently not codable concepts without codable children: Secondary | ICD-10-CM

## 2019-08-17 DIAGNOSIS — K8020 Calculus of gallbladder without cholecystitis without obstruction: Secondary | ICD-10-CM | POA: Insufficient documentation

## 2019-08-17 DIAGNOSIS — R911 Solitary pulmonary nodule: Secondary | ICD-10-CM | POA: Insufficient documentation

## 2019-08-17 DIAGNOSIS — I700 Atherosclerosis of aorta: Secondary | ICD-10-CM | POA: Diagnosis not present

## 2019-08-17 LAB — GLUCOSE, CAPILLARY: Glucose-Capillary: 110 mg/dL — ABNORMAL HIGH (ref 70–99)

## 2019-08-17 MED ORDER — FLUDEOXYGLUCOSE F - 18 (FDG) INJECTION
8.1000 | Freq: Once | INTRAVENOUS | Status: AC | PRN
  Administered 2019-08-17: 8.1 via INTRAVENOUS

## 2019-08-17 NOTE — Progress Notes (Signed)
Kayla Wyatt Kitchen    HEMATOLOGY/ONCOLOGY CLINIC NOTE  Date of Service: 08/17/2019   Patient Care Team: [name], MD as PCP - General (Family Medicine)  CHIEF COMPLAINTS/PURPOSE OF CONSULTATION:   F/u for continued management of Non-Small Cell lung Carcinoma  HISTORY OF PRESENTING ILLNESS:   Kayla Wyatt is a wonderful 64 y.o. female who has been referred to [place] by Dr [name] for evaluation and management of Non-Small Cell Carcinoma. She is accompanied today by her husband. The pt reports that she is doing well overall.   Patient recently had a significant hospitalization on 04/02/2017 - as per DC summary by [name] " with abdominal pain, hematemesis, melena and ABLA due to GIB.  She underwent clipping of gastric ulcer and received multiple units PRBC but continued to have drop in H/H with bloody stool. NM GI scan was negative for bleeding and she underwent colonoscopy by Dr. [name] 1/31 showing diverticulosis but no signs of bleeding. Hospital course significant for R- gastrocnemius DVT, PAF, incidental findings of mediastinal mass as well as brief episode of hematuria. She was cleared to start on Xarelto on 02/01 for treatment of DVT. On 2/2 am she developed hematemesis with maroon stools and hypotension requiring fluid bolus as well as 2 units PRBCs. She continued to decline requiring intubation, pressors as well as reversal of anticoagulation with Kcentra on 02/3. She underwent UGI revealing large clot with fresh blood in stomach and underwent mesenteric arteriogram with percutaneous coil embolization fo distal tributary of left gastric artery and placement of IVC filter by interventional radiology.   A fib with RVR felt to be due to hemorrhagic shock and she converted to NSR. [name] felt no further cardiac work up indicated and did not recommend initiating anticoagulation in short term. To follow up with [name] after PET scan and routine biopsy after medically stable. She  continued to have bleeding and underwent visceral angiogram with embolization of inferior division of splenic artery and associated gastric arteries on 2/4. No surgical intervention needed per [name] with recommendations to continue monitoring H/H as well as for recurrence of hematochezia. She tolerated extubation on 2/6 and respiratory status improving.  On 2/11, she has episode of unresponsiveness with jerking of bilateral limbs that lasted about 3 minutes. She had amnesia of events but was back to baseline. EEG revealed "focal slowing over the right temporo-occipital regionandoccasional epileptiform discharges over the right occipital region". Head CT reviewed, unremarkable for acute intracranial process. MRI brain done revealing mild biparietal signal abnormality suspicious for PRES. [name] recommended starting patient on Keppra BID with repeat MRI in 2 weeks. New onset seizure likely provoked by PRES--if resolved--Ok to take patient off Keppra. Patient with resultant generalized weakness. CIR recommended due to functional deficits. "  Later as outpatient she had a PET scan on 05/14/17 which showed a hypermetabolic mediastinal mass which was subsequently biopsied with a bronchoscopy on 05/19/17. She notes that she had smoked for about 35-40 years, half a pack of a day. She reports that she has not had any other symptoms related to her smoking; she has never had to be on home oxygen or inhalers. She has recently begun Symbicort. While being in the hospital she lost about 20 lbs but has maintained her weight since being discharged.  The pt and her husband note that she is concerned about what to do if she is not able to go back to work after her 90-day leave. We recommended that they speak with  our social workers here to explore the available options for insurance coverage /disability application etc.  Of note prior to the patient's visit today, pt has had PET/CT completed on 05/14/17 with  results revealing 1. The known mediastinal mass is intensely hypermetabolic, worrisome for malignancy. This could reflect lymphoma or small cell lung cancer. Tissue sampling recommended.2. No peripheral lung mass identified. New patchy ground-glass opacity in the lingula is mildly hypermetabolic and likely inflammatory.  On 05/19/17 the pt had a Fine Needle Aspiration which revealed Non-Small cell lung carcinoma.  Most recent lab results (05/19/17) of CBC  is as follows: all values are WNL except for RBC at 2.90, Hgb at 8.6, HCT at 28.0, RDW at 16.4.  On review of systems, pt reports knee pain, and denies abdominal pains, leg swelling, SOB, CP, and any other symptoms.   On PMHx the pt reports acute blood loss anemia, GI hemorrhage with melena, hypercholesteremia, HTN, persistent atrial fibrillation with rapid ventricular response, Vitamin D deficiency, denies liver and kidney problems. On Social Hx the pt reports having smoked for 35-40 years of half a pack per day. She quit smoking on 03/31/17.  On Family Hx the pt reports that her father had heart disease.  Interval History:   I connected with  [name] on 08/17/19 by telephone and verified that I am speaking with the correct person using two identifiers.   I discussed the limitations of evaluation and management by telemedicine. The patient expressed understanding and agreed to proceed.  Other persons participating in the visit and their role in the encounter:        -[name], Medical Scribe  Patient's location: Home  Provider's location: [place] at [facility]   Kayla Wyatt returns today regarding her Non-Small Cell Carcinoma. The patient's last visit with [place] was on 08/03/2019. The pt reports that shey is doing well overall.  The pt reports that she has felt well and has no new concerns.   Of note since the patient's last visit, pt has had PET/CT ([id number]) completed on 08/17/2019 with results revealing "1. Volume  loss in the left perihilar region and dependently within the superior segment left lower lobe shows metabolism at or below blood pool. Findings may be post treatment and/or postinfectious/postinflammatory in etiology. 2. Minimally hypermetabolic inguinal lymph nodes, increased in size from 01/26/2019 and likely reactive in etiology. 3. Cholelithiasis. 4. Tiny right renal stone. 5. Aortic atherosclerosis (ICD10-I70.0). Coronary artery calcification. 6.  Emphysema (ICD10-J43.9)."  On review of systems, pt denies any other symptoms.   MEDICAL HISTORY:  Past Medical History:  Diagnosis Date  . Acute blood loss anemia 04/02/2017  . Anxiety   . Gastrointestinal hemorrhage with melena   . Headache   . Hypercholesteremia   . Hypertension   . lung ca dx'd 05/2017  . Persistent atrial fibrillation with rapid ventricular response ([place]) 04/07/2017  . Seizures ([place])    04/2017 while in hospital at [facility]  . Vitamin D deficiency     SURGICAL HISTORY: Past Surgical History:  Procedure Laterality Date  . COLONOSCOPY Left 04/09/2017   Procedure: COLONOSCOPY;  Surgeon: Carol Ada, MD;  Location: [place];  Service: Endoscopy;  Laterality: Left;  . ESOPHAGOGASTRODUODENOSCOPY N/A 04/03/2017   Procedure: ESOPHAGOGASTRODUODENOSCOPY (EGD);  Surgeon: Carol Ada, MD;  Location: [place];  Service: Endoscopy;  Laterality: N/A;  . ESOPHAGOGASTRODUODENOSCOPY N/A 08/10/2019   Procedure: ESOPHAGOGASTRODUODENOSCOPY (EGD);  Surgeon: Juanita Craver, MD;  Location: [facility];  Service: Endoscopy;  Laterality: N/A;  .  ESOPHAGOGASTRODUODENOSCOPY (EGD) WITH PROPOFOL N/A 04/12/2017   Procedure: ESOPHAGOGASTRODUODENOSCOPY (EGD) WITH PROPOFOL;  Surgeon: Milus Banister, MD;  Location: [facility] ENDOSCOPY;  Service: Endoscopy;  Laterality: N/A;  . FOREIGN BODY REMOVAL N/A 08/10/2019   Procedure: FOREIGN BODY REMOVAL;  Surgeon: Juanita Craver, MD;  Location: [facility] ENDOSCOPY;  Service: Endoscopy;  Laterality: N/A;  . IR ANGIOGRAM  SELECTIVE EACH ADDITIONAL VESSEL  04/12/2017  . IR ANGIOGRAM SELECTIVE EACH ADDITIONAL VESSEL  04/13/2017  . IR ANGIOGRAM SELECTIVE EACH ADDITIONAL VESSEL  04/13/2017  . IR ANGIOGRAM SELECTIVE EACH ADDITIONAL VESSEL  04/13/2017  . IR ANGIOGRAM VISCERAL SELECTIVE  04/12/2017  . IR ANGIOGRAM VISCERAL SELECTIVE  04/13/2017  . IR EMBO ART  VEN HEMORR LYMPH EXTRAV  INC GUIDE ROADMAPPING  04/12/2017  . IR EMBO ART  VEN HEMORR LYMPH EXTRAV  INC GUIDE ROADMAPPING  04/13/2017  . IR FLUORO GUIDE CV LINE RIGHT  04/13/2017  . IR FLUORO GUIDE PORT INSERTION RIGHT  07/27/2017  . IR IVC FILTER PLMT / S&I /IMG GUID/MOD SED  04/12/2017  . IR US GUIDE VASC ACCESS RIGHT  04/12/2017  . IR US GUIDE VASC ACCESS RIGHT  04/13/2017  . IR US GUIDE VASC ACCESS RIGHT  04/13/2017  . IR US GUIDE VASC ACCESS RIGHT  07/27/2017  . LEG SURGERY  1974   Blood Clot Removal   . VIDEO BRONCHOSCOPY WITH ENDOBRONCHIAL ULTRASOUND N/A 05/19/2017   Procedure: VIDEO BRONCHOSCOPY WITH ENDOBRONCHIAL ULTRASOUND;  Surgeon: Grace Isaac, MD;  Location: [place];  Service: Thoracic;  Laterality: N/A;    SOCIAL HISTORY: Social History   Socioeconomic History  . Marital status: Single    Spouse name: Not on file  . Number of children: Not on file  . Years of education: Not on file  . Highest education level: Not on file  Occupational History  . Occupation: Has to lift heavy boxes at times.    Employer: [employer]  Tobacco Use  . Smoking status: Former Smoker    Packs/day: 0.50    Years: 40.00    Pack years: 20.00    Types: Cigarettes    Quit date: 03/31/2017    Years since quitting: 2.3  . Smokeless tobacco: Never Used  Substance and Sexual Activity  . Alcohol use: No  . Drug use: No  . Sexual activity: Not Currently  Other Topics Concern  . Not on file  Social History Narrative  . Not on file   Social Determinants of Health   Financial Resource Strain:   . Difficulty of Paying Living Expenses:   Food Insecurity:   . Worried About Paediatric nurse in the Last Year:   . Arboriculturist in the Last Year:   Transportation Needs:   . Film/video editor (Medical):   Kayla Wyatt Kitchen Lack of Transportation (Non-Medical):   Physical Activity:   . Days of Exercise per Week:   . Minutes of Exercise per Session:   Stress:   . Feeling of Stress :   Social Connections:   . Frequency of Communication with Friends and Family:   . Frequency of Social Gatherings with Friends and Family:   . Attends Religious Services:   . Active Member of Clubs or Organizations:   . Attends Archivist Meetings:   Kayla Wyatt Kitchen Marital Status:   Intimate Partner Violence:   . Fear of Current or Ex-Partner:   . Emotionally Abused:   Kayla Wyatt Kitchen Physically Abused:   . Sexually Abused:  FAMILY HISTORY: Family History  Problem Relation Age of Onset  . Heart disease Father     ALLERGIES:  has No Known Allergies.  MEDICATIONS:  Current Outpatient Medications  Medication Sig Dispense Refill  . acetaminophen (TYLENOL) 325 MG tablet Take 1-2 tablets (325-650 mg total) by mouth every 4 (four) hours as needed for mild pain.    . cholecalciferol (VITAMIN D) 1000 units tablet Take 1,000 Units by mouth 2 (two) times daily.    Kayla Wyatt Kitchen levothyroxine (SYNTHROID) 88 MCG tablet TAKE 1 TABLET(88 MCG) BY MOUTH DAILY (Patient taking differently: Take 88 mcg by mouth daily before breakfast. TAKE 1 TABLET(88 MCG) BY MOUTH DAILY) 30 tablet 0  . lidocaine-prilocaine (EMLA) cream Apply to affected area once (Patient not taking: Reported on 08/10/2019) 30 g 3  . lisinopril-hydrochlorothiazide (PRINZIDE,ZESTORETIC) 20-12.5 MG tablet Take 1 tablet by mouth daily.    . pantoprazole (PROTONIX) 40 MG tablet Take 40 mg by mouth daily.   6  . pantoprazole (PROTONIX) 40 MG tablet Take 1 tablet (40 mg total) by mouth daily. 30 tablet 3  . triamcinolone cream (KENALOG) 0.1 % Apply 1 application topically 2 (two) times daily.      No current facility-administered medications for this visit.    Facility-Administered Medications Ordered in Other Visits  Medication Dose Route Frequency Provider Last Rate Last Admin  . sodium chloride flush (NS) 0.9 % injection 10 mL  10 mL Intracatheter PRN Brunetta Genera, MD   10 mL at 08/10/18 1558    REVIEW OF SYSTEMS:   A 10+ POINT REVIEW OF SYSTEMS WAS OBTAINED including neurology, dermatology, psychiatry, cardiac, respiratory, lymph, extremities, GI, GU, Musculoskeletal, constitutional, breasts, reproductive, HEENT.  All pertinent positives are noted in the HPI.  All others are negative.   PHYSICAL EXAMINATION: ECOG FS:1 - Symptomatic but completely ambulatory  Vitals:   08/17/19 0846  BP: (!) 133/59  Pulse: 81  Resp: 18  Temp: (!) 97.5 F (36.4 C)  SpO2: 98%   Wt Readings from Last 3 Encounters:  08/17/19 158 lb 6.4 oz (71.8 kg)  08/11/19 162 lb 14.4 oz (73.9 kg)  08/03/19 162 lb 14.4 oz (73.9 kg)   Body mass index is 28.06 kg/m.    Telehealth visit   LABORATORY DATA:  I have reviewed the data as listed  . CBC Latest Ref Rng & Units 07/27/2019 05/11/2019 02/10/2019  WBC 4.0 - 10.5 K/uL 9.0 6.4 7.4  Hemoglobin 12.0 - 15.0 g/dL 9.8(L) 11.2(L) 10.9(L)  Hematocrit 36.0 - 46.0 % 30.3(L) 34.5(L) 34.1(L)  Platelets 150 - 400 K/uL 282 292 247    CMP Latest Ref Rng & Units 07/27/2019 05/11/2019 02/10/2019  Glucose 70 - 99 mg/dL 98 84 128(H)  BUN 8 - 23 mg/dL 20 25(H) 29(H)  Creatinine 0.44 - 1.00 mg/dL 0.90 1.25(H) 1.12(H)  Sodium 135 - 145 mmol/L 141 145 145  Potassium 3.5 - 5.1 mmol/L 3.8 4.3 3.9  Chloride 98 - 111 mmol/L 105 107 110  CO2 22 - 32 mmol/L '26 27 25  '$ Calcium 8.9 - 10.3 mg/dL 9.2 10.1 9.4  Total Protein 6.5 - 8.1 g/dL 6.7 7.1 7.2  Total Bilirubin 0.3 - 1.2 mg/dL 0.2(L) 0.2(L) 0.2(L)  Alkaline Phos 38 - 126 U/L 69 64 74  AST 15 - 41 U/L '18 23 18  '$ ALT 0 - 44 U/L '13 12 15  '$ 01/26/2019 CT Abdomen Pelvis W Contrast (Accession [id number])    01/26/2019 CT Chest W Contrast (Accession [id number])   05/19/17  Fine  Needle Aspiration:  05/19/17 Bronchial Washing Specimen B:   RADIOGRAPHIC STUDIES: I have personally reviewed the radiological images as listed and agreed with the findings in the report. CT Chest W Contrast  Result Date: 07/27/2019 CLINICAL DATA:  Non-small cell lung cancer, post chemo radiotherapy EXAM: CT CHEST WITH CONTRAST TECHNIQUE: Multidetector CT imaging of the chest was performed during intravenous contrast administration. CONTRAST:  31m OMNIPAQUE IOHEXOL 300 MG/ML  SOLN COMPARISON:  01/26/2019 hip down RIGHT IJ Port-A-Cath terminating at the caval to atrial junction. FINDINGS: Cardiovascular: Calcified atheromatous plaque in the thoracic aorta. No aneurysmal dilation. Moderate atheromatous changes as before. No pericardial effusion. Heart size mildly enlarged. Central pulmonary vessels unchanged. Mediastinum/Nodes: Thoracic inlet structures without adenopathy. No axillary lymphadenopathy. No mediastinal lymphadenopathy. No hilar lymphadenopathy. Lungs/Pleura: Post treatment changes about the LEFT hilum similar to prior study. Peripheral upper lobe nodularity is similar. Slight increasing nodularity along the major fissure adjacent to the LEFT hilum. Nodularity along the pleural surface in the posterior LEFT chest with increase, focal area of nodularity measures 13 x 10 mm, previously 10 x 7 mm (image 63, series 7) Background centrilobular and paraseptal emphysema is unchanged. No pleural effusion. Airways are patent. Upper Abdomen: Incidental imaging of upper abdominal contents is unremarkable aside from postoperative changes about the stomach, partially imaged. No acute upper abdominal process. Adrenal glands are normal. Musculoskeletal: Spinal degenerative changes. No acute or destructive bone process. IMPRESSION: 1. Post treatment changes about the LEFT hilum. 2. Nodularity along the pleural surface in the posterior LEFT chest with increase, raising the question of metastasis or  metachronous primary, particularly given the presence of subtle nodular ground-glass in this area in February of 2020, increasing slightly over time. PET scan may be helpful for further assessment. 3. Slight increasing nodularity above the LEFT hilum along the major fissure potentially evolving post treatment changes but given other findings suggest attention to this area on PET evaluation. 4. Emphysema and aortic atherosclerosis. Aortic Atherosclerosis (ICD10-I70.0) and Emphysema (ICD10-J43.9). Electronically Signed   By: GZetta BillsM.D.   On: 07/27/2019 14:56   NM PET Image Restag (PS) Skull Base To Thigh  Result Date: 08/17/2019 CLINICAL DATA:  Subsequent treatment strategy for non-small cell lung cancer with local recurrence. Assess treatment response. Patient is status post chemo radiation and 1 year of immunotherapy with new pleural nodules. EXAM: NUCLEAR MEDICINE PET SKULL BASE TO THIGH TECHNIQUE: 8.1 mCi F-18 FDG was injected intravenously. Full-ring PET imaging was performed from the skull base to thigh after the radiotracer. CT data was obtained and used for attenuation correction and anatomic localization. Fasting blood glucose: 110 mg/dl COMPARISON:  CT chest 07/27/2019, CT abdomen pelvis 01/26/2019 and PET 12/24/2017. FINDINGS: Mediastinal blood pool activity: SUV max 2.8 Liver activity: SUV max NA NECK: No abnormal hypermetabolism. Incidental CT findings: None. CHEST: No hypermetabolic mediastinal, hilar or axillary lymph nodes. No hypermetabolic pulmonary nodules. Left perihilar volume loss has an SUV max of 2.8. CT appearance is unchanged over prior examinations. Dependent volume loss in the superior segment left lower lobe does not have hypermetabolism above blood pool, SUV max 2.7. Incidental CT findings: Right IJ Port-A-Cath terminates in the right atrium. Atherosclerotic calcification of the aorta and coronary arteries. Heart is at the upper limits of normal in size to mildly enlarged. No  pericardial or pleural effusion. Centrilobular and paraseptal emphysema. ABDOMEN/PELVIS: No abnormal hypermetabolism in the liver, adrenal glands, spleen or pancreas. Inguinal lymph nodes measure up to 10 mm on the right, previously 7 mm  on 01/26/2019, with an SUV max of 4.1. Most of these lymph nodes have a fatty hilum. Incidental CT findings: Liver is unremarkable. A stone is seen in the gallbladder. Adrenal glands are unremarkable. Tiny stone in the right kidney. Renal vascular calcifications on the left. Spleen, pancreas, stomach and bowel are grossly unremarkable. A cystocele is noted. Vascular coils in the left upper quadrant. IVC filter is in place. Atherosclerotic calcification of the aorta without aneurysm. SKELETON: No abnormal hypermetabolism. Incidental CT findings: Degenerative changes in the spine. No worrisome lytic or sclerotic lesions. IMPRESSION: 1. Volume loss in the left perihilar region and dependently within the superior segment left lower lobe shows metabolism at or below blood pool. Findings may be post treatment and/or postinfectious/postinflammatory in etiology. 2. Minimally hypermetabolic inguinal lymph nodes, increased in size from 01/26/2019 and likely reactive in etiology. 3. Cholelithiasis. 4. Tiny right renal stone. 5. Aortic atherosclerosis (ICD10-I70.0). Coronary artery calcification. 6.  Emphysema (ICD10-J43.9). Electronically Signed   By: Lorin Picket M.D.   On: 08/17/2019 13:58   MRI brain 04/21/2017: IMPRESSION: 1. Motion degraded examination. 2. 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 3. Otherwise negative MRI of the head with and without contrast for age.  Electronically Signed   By: Elon Alas M.D.   On: 04/22/2017 00:34    ASSESSMENT & PLAN:   64 y.o. female with  1.  Non-Small Cell Carcinoma atleast Stage IIIB  Not enough tissue for further characterization or mutation  testing. Presenting with mediastinal nodal mass - TxN2 Mx (Atleast Stage III disease) ? Lingular primary vs inflammation. No overt evidence of metastatic disease.  -EGFR mutation studies on blood negative. Patient is s/p 3 cycles of Carboplatin/taxol -resolution left upper lobe ground-glass opacity has resolved in the interval. 08/19/17 PET revealed  No significant change in size of hypermetabolic AP window mass. The degree of FDG uptake is slightly increased in the interval. 2. Persistent hypermetabolic focus involving the distal aspect of the tongue within SUV max of 16.46. Similar to previous exam. (no clinical co-relate). Lung involvement has decreased.   08/19/17 Brain MRI revealed No evidence of intracranial metastases. Interval resolution of bilateral parietal signal abnormality favoring PRES  Completed concurrent Carbo/taxol chemotherapy + RT  12/24/17 PET/CT revealed Marked interval response to therapy with large AP window lymph nodes seen on the previous study almost imperceptible on today's exam. Hypermetabolism within this lesion has resolved with FDG accumulation now at background blood pool levels. 2. Small focus of hypermetabolism in the anal region, similar to prior and indeterminate by PET imaging. 3. Hypermetabolic focus at the distal tongue has decreased in the Interval. 4. Marked pelvic floor laxity with cystocele. 5. Stable tiny left lung nodules. 6.  Aortic Atherosclerois. 7.  Emphysema.   04/03/17 endoscopy did not reveal esophageal thickening    04/27/18 CT Chest revealed Stable exam. No new or progressive findings. 2. Stable mild circumferential wall thickening mid esophagus. Esophagitis is a consideration. 3. Stable tiny left pulmonary nodules. 4.  Emphysema. 5.  Aortic Atherosclerois.  01/26/2019 CT Abdomen Pelvis W Contrast (Accession [id number]) revealed "1. Patchy areas of consolidation, bronchiectasis and ground-glass in the medial left hemithorax, presumably treatment  related. No evidence of metastatic disease.2. Extensive debris in the mainstem bronchi, bronchus intermedius and left upper lobe bronchus."    07/27/2019 CT Chest ([id number]) revealed "Nodularity along the pleural surface in the posterior left chest."  2. Bone Pain from neulasta - managed with Tylenol  3. Previous h/ot SZ -  currently on Keppra. MRI brain - resolution of findings of PRES  4. H/o rt calf DVT - off anticoagulation due to massive GI bleeding. S/p IVC filter. No issues currently  5. S/p previous GI bleeding from Gastric ulcer --needing clipping and arterial embolization. On PPI BID Ulcers seen to be healed with recent April 2020 repeat endoscopy  6. H/o ctx /steroid related thrush --now resolved.  8. Elevated TSH/Hypothyroidism -- ? Related to immunotherapy 07/13/18 Thyroid functions revealed TSH at 62.91 and Free T4 at 0.30 Began low dose 70mg Levothyroxine and provide endocrinology referral for further optimization. Recommend taking Levothyroxine 1.5 hours before to breakfast with only water and no other medications.  F/u with Dr. SKelton Pillarin Endocrinology   PLAN: -Discussed 08/17/2019 PET/CT ([id number] which revealed  "1. Volume loss in the left perihilar region and dependently within the superior segment left lower lobe shows metabolism at or below blood pool. Findings may be post treatment and/or postinfectious/postinflammatory in etiology. 2. Minimally hypermetabolic inguinal lymph nodes, increased in size from 01/26/2019 and likely reactive in etiology. 3. Cholelithiasis. 4. Tiny right renal stone. 5. Aortic atherosclerosis (ICD10-I70.0). Coronary artery calcification. 6.  Emphysema (ICD10-J43.9)." -No lab, clinical, or radiographic evidence of Non-Small Cell Carcinoma recurrence at this time.  -Will continue to monitor with labs and clinic visits every 3 months  -Continue 88 mcg Synthroid daily -Will see back in 3-4 months with labs   FOLLOW UP: RTC with Dr. KIrene Limbo in 3 months with port flush and  labs    The total time spent in the appt was 20 minutes and more than 50% was on counseling and direct patient cares.  All of the patient's questions were answered with apparent satisfaction. The patient knows to call the clinic with any problems, questions or concerns.   GSullivan LoneMD MPotosiAAHIVMS SFoundations Behavioral HealthCNational Park Medical CenterHematology/Oncology Physician [facility] (Office):       [phone](Work cell):  [phone](Fax):           [phone] 08/17/2019 4:46 PM  I, JYevette Edwards am acting as a scribe for Dr. GSullivan Lone   .I have reviewed the above documentation for accuracy and completeness, and I agree with the above. .Brunetta GeneraMD

## 2019-08-19 ENCOUNTER — Telehealth: Payer: Self-pay | Admitting: Hematology

## 2019-08-19 NOTE — Telephone Encounter (Signed)
Scheduled per 06/09 los, patient has been called and notified.

## 2019-08-24 ENCOUNTER — Other Ambulatory Visit: Payer: Self-pay

## 2019-08-24 ENCOUNTER — Ambulatory Visit (INDEPENDENT_AMBULATORY_CARE_PROVIDER_SITE_OTHER): Payer: PPO | Admitting: Obstetrics & Gynecology

## 2019-08-24 ENCOUNTER — Encounter: Payer: Self-pay | Admitting: Obstetrics & Gynecology

## 2019-08-24 VITALS — BP 102/61 | HR 70 | Wt 154.9 lb

## 2019-08-24 DIAGNOSIS — N813 Complete uterovaginal prolapse: Secondary | ICD-10-CM

## 2019-08-24 NOTE — Progress Notes (Signed)
GYNECOLOGY OFFICE VISIT NOTE  History:   Kayla Wyatt is a 64 y.o. PMP female with complicated medical history here for to discuss management of Stage IV uterine prolapse. She denies any abnormal vaginal discharge, bleeding, pelvic pain, urinary or bowel issues or other concerns.      Patient Active Problem List   Diagnosis Date Noted  . Non-small cell lung cancer (NSCLC) ([place]) 01/18/2018  . Port-A-Cath in place 09/25/2017  . Counseling regarding advance care planning and goals of care 06/21/2017  . Primary malignant neoplasm of mediastinum ([place]) 06/05/2017  . Debility   . Diarrhea   . Acute gastric ulcer   . Hypoxia   . Hypoalbuminemia due to protein-calorie malnutrition ([place])   . Hypomagnesemia   . Hyperglycemia   . Critical illness myopathy 04/22/2017  . PRES (posterior reversible encephalopathy syndrome)   . Acute deep vein thrombosis (DVT) of right lower extremity ([place])   . Pressure injury of sacral region, unstageable ([place])   . Gastrointestinal hemorrhage with hematemesis   . Mediastinal mass   . Hypokalemia   . Hypocalcemia   . Supplemental oxygen dependent   . Encounter for intubation   . Respiratory failure ([place])   . Persistent atrial fibrillation with rapid ventricular response ([place]) 04/07/2017  . Gastrointestinal hemorrhage with melena   . Lower abdominal pain of unknown etiology   . Abnormal computed tomography angiography (CTA) of abdomen and pelvis   . Acute blood loss anemia 04/02/2017    Past Medical History:  Diagnosis Date  . Acute blood loss anemia 04/02/2017  . Anxiety   . Gastrointestinal hemorrhage with melena   . Headache   . Hypercholesteremia   . Hypertension   . lung ca dx'd 05/2017  . Persistent atrial fibrillation with rapid ventricular response ([place]) 04/07/2017  . Seizures ([place])    04/2017 while in hospital at [facility]  . Vitamin D deficiency     Past Surgical History:  Procedure Laterality Date  . COLONOSCOPY Left 04/09/2017    Procedure: COLONOSCOPY;  Surgeon: Carol Ada, MD;  Location: [place];  Service: Endoscopy;  Laterality: Left;  . ESOPHAGOGASTRODUODENOSCOPY N/A 04/03/2017   Procedure: ESOPHAGOGASTRODUODENOSCOPY (EGD);  Surgeon: Carol Ada, MD;  Location: [place];  Service: Endoscopy;  Laterality: N/A;  . ESOPHAGOGASTRODUODENOSCOPY N/A 08/10/2019   Procedure: ESOPHAGOGASTRODUODENOSCOPY (EGD);  Surgeon: Juanita Craver, MD;  Location: [facility];  Service: Endoscopy;  Laterality: N/A;  . ESOPHAGOGASTRODUODENOSCOPY (EGD) WITH PROPOFOL N/A 04/12/2017   Procedure: ESOPHAGOGASTRODUODENOSCOPY (EGD) WITH PROPOFOL;  Surgeon: Milus Banister, MD;  Location: [facility] ENDOSCOPY;  Service: Endoscopy;  Laterality: N/A;  . FOREIGN BODY REMOVAL N/A 08/10/2019   Procedure: FOREIGN BODY REMOVAL;  Surgeon: Juanita Craver, MD;  Location: [facility] ENDOSCOPY;  Service: Endoscopy;  Laterality: N/A;  . IR ANGIOGRAM SELECTIVE EACH ADDITIONAL VESSEL  04/12/2017  . IR ANGIOGRAM SELECTIVE EACH ADDITIONAL VESSEL  04/13/2017  . IR ANGIOGRAM SELECTIVE EACH ADDITIONAL VESSEL  04/13/2017  . IR ANGIOGRAM SELECTIVE EACH ADDITIONAL VESSEL  04/13/2017  . IR ANGIOGRAM VISCERAL SELECTIVE  04/12/2017  . IR ANGIOGRAM VISCERAL SELECTIVE  04/13/2017  . IR EMBO ART  VEN HEMORR LYMPH EXTRAV  INC GUIDE ROADMAPPING  04/12/2017  . IR EMBO ART  VEN HEMORR LYMPH EXTRAV  INC GUIDE ROADMAPPING  04/13/2017  . IR FLUORO GUIDE CV LINE RIGHT  04/13/2017  . IR FLUORO GUIDE PORT INSERTION RIGHT  07/27/2017  . IR IVC FILTER PLMT / S&I /IMG GUID/MOD SED  04/12/2017  . IR US GUIDE VASC  ACCESS RIGHT  04/12/2017  . IR US GUIDE VASC ACCESS RIGHT  04/13/2017  . IR US GUIDE VASC ACCESS RIGHT  04/13/2017  . IR US GUIDE VASC ACCESS RIGHT  07/27/2017  . LEG SURGERY  1974   Blood Clot Removal   . VIDEO BRONCHOSCOPY WITH ENDOBRONCHIAL ULTRASOUND N/A 05/19/2017   Procedure: VIDEO BRONCHOSCOPY WITH ENDOBRONCHIAL ULTRASOUND;  Surgeon: Grace Isaac, MD;  Location: [place];  Service: Thoracic;  Laterality:  N/A;    The following portions of the patient's history were reviewed and updated as appropriate: allergies, current medications, past family history, past medical history, past social history, past surgical history and problem list.   Health Maintenance:  Normal pap and negative HRHPV on 07/26/2019.    Review of Systems:  Pertinent items noted in HPI and remainder of comprehensive ROS otherwise negative.  Physical Exam:  BP 102/61   Pulse 70   Wt 154 lb 14.4 oz (70.3 kg)   SpO2 98%   BMI 27.44 kg/m  CONSTITUTIONAL: Well-developed, well-nourished female in no acute distress.  HEENT:  Normocephalic, atraumatic. External right and left ear normal. No scleral icterus.  NECK: Normal range of motion, supple, no masses noted on observation SKIN: No rash noted. Not diaphoretic. No erythema. No pallor. MUSCULOSKELETAL: Normal range of motion. No edema noted. NEUROLOGIC: Alert and oriented to person, place, and time. Normal muscle tone coordination. No cranial nerve deficit noted. PSYCHIATRIC: Normal mood and affect. Normal behavior. Normal judgment and thought content. CARDIOVASCULAR: Normal heart rate noted RESPIRATORY: Effort and breath sounds normal, no problems with respiration noted ABDOMEN: No masses noted. No other overt distention noted.   PELVIC: 2/3 of the uterus noted to be prolapsed outside the vagina.  Some healed denuded areas on the cervix, especially on the right side.  Able to easily push the uterus back into vagina.  Patient will need a pessary that is at least 5 cm wide to help with support.  No other palpable masses, no uterine or adnexal tenderness. Performed in the presence of a chaperone     Assessment and Plan:    1. Uterine procidentia Discussed that patient was not a good surgical candidate given her co-morbidities, pessary placement recommended.  She is currently unsure of what she wants to do. Will discuss with her husband and get back to [place]. Will recommend a ring  with support if she decides to proceed with pessary. If she insists on surgery, will refer to [facility] or other tertiary Urogynecology center given her co-morbidities.  Please refer to After Visit Summary for other counseling recommendations.   Return for For pessary placement if desired.    Total face-to-face time with patient: 20 minutes.  Over 50% of encounter was spent on counseling and coordination of care.   Verita Schneiders, MD, [place] for [employer], [place]

## 2019-08-24 NOTE — Patient Instructions (Signed)
Pelvic Organ Prolapse Pelvic organ prolapse is the stretching, bulging, or dropping of pelvic organs into an abnormal position. It happens when the muscles and tissues that surround and support pelvic structures become weak or stretched. Pelvic organ prolapse can involve the:  Vagina (vaginal prolapse).  Uterus (uterine prolapse).  Bladder (cystocele).  Rectum (rectocele).  Intestines (enterocele). When organs other than the vagina are involved, they often bulge into the vagina or protrude from the vagina, depending on how severe the prolapse is. What are the causes? This condition may be caused by:  Pregnancy, labor, and childbirth.  Past pelvic surgery.  Decreased production of the hormone estrogen associated with menopause.  Consistently lifting more than 50 lb (23 kg).  Obesity.  Long-term inability to pass stool (chronic constipation).  A cough that lasts a long time (chronic).  Buildup of fluid in the abdomen due to certain diseases and other conditions. What are the signs or symptoms? Symptoms of this condition include:  Passing a little urine (loss of bladder control) when you cough, sneeze, strain, and exercise (stress incontinence). This may be worse immediately after childbirth. It may gradually improve over time.  Feeling pressure in your pelvis or vagina. This pressure may increase when you cough or when you are passing stool.  A bulge that protrudes from the opening of your vagina.  Difficulty passing urine or stool.  Pain in your lower back.  Pain, discomfort, or disinterest in sex.  Repeated bladder infections (urinary tract infections).  Difficulty inserting a tampon. In some people, this condition causes no symptoms. How is this diagnosed? This condition may be diagnosed based on a vaginal and rectal exam. During the exam, you may be asked to cough and strain while you are lying down, sitting, and standing up. Your health care provider will  determine if other tests are required, such as bladder function tests. How is this treated? Treatment for this condition may depend on your symptoms. Treatment may include:  Lifestyle changes, such as changes to your diet.  Emptying your bladder at scheduled times (bladder training therapy). This can help reduce or avoid urinary incontinence.  Estrogen. Estrogen may help mild prolapse by increasing the strength and tone of pelvic floor muscles.  Kegel exercises. These may help mild cases of prolapse by strengthening and tightening the muscles of the pelvic floor.  A soft, flexible device that helps support the vaginal walls and keep pelvic organs in place (pessary). This is inserted into your vagina by your health care provider.  Surgery. This is often the only form of treatment for severe prolapse. Follow these instructions at home:  Avoid drinking beverages that contain caffeine or alcohol.  Increase your intake of high-fiber foods. This can help decrease constipation and straining during bowel movements.  Lose weight if recommended by your health care provider.  Wear a sanitary pad or adult diapers if you have urinary incontinence.  Avoid heavy lifting and straining with exercise and work. Do not hold your breath when you perform mild to moderate lifting and exercise activities. Limit your activities as directed by your health care provider.  Do Kegel exercises as directed by your health care provider. To do this: ? Squeeze your pelvic floor muscles tight. You should feel a tight lift in your rectal area and a tightness in your vaginal area. Keep your stomach, buttocks, and legs relaxed. ? Hold the muscles tight for up to 10 seconds. ? Relax your muscles. ? Repeat this exercise 50 times a day,   or as many times as told by your health care provider. Continue to do this exercise for at least 4-6 weeks, or for as long as told by your health care provider.  Take over-the-counter and  prescription medicines only as told by your health care provider.  If you have a pessary, take care of it as told by your health care provider.  Keep all follow-up visits as told by your health care provider. This is important. Contact a health care provider if you:  Have symptoms that interfere with your daily activities or sex life.  Need medicine to help with the discomfort.  Notice bleeding from your vagina that is not related to your period.  Have a fever.  Have pain or bleeding when you urinate.  Have bleeding when you pass stool.  Pass urine when you have sex.  Have chronic constipation.  Have a pessary that falls out.  Have bad smelling vaginal discharge.  Have an unusual, low pain in your abdomen. Summary  Pelvic organ prolapse is the stretching, bulging, or dropping of pelvic organs into an abnormal position. It happens when the muscles and tissues that surround and support pelvic structures become weak or stretched.  When organs other than the vagina are involved, they often bulge into the vagina or protrude from the vagina, depending on how severe the prolapse is.  In most cases, this condition needs to be treated only if it produces symptoms. Treatment may include lifestyle changes, estrogen, Kegel exercises, pessary insertion, or surgery.  Avoid heavy lifting and straining with exercise and work. Do not hold your breath when you perform mild to moderate lifting and exercise activities. Limit your activities as directed by your health care provider. This information is not intended to replace advice given to you by your health care provider. Make sure you discuss any questions you have with your health care provider. Document Revised: 03/18/2017 Document Reviewed: 03/18/2017 Elsevier Patient Education  2020 [employer] Inc.  

## 2019-08-25 DIAGNOSIS — K222 Esophageal obstruction: Secondary | ICD-10-CM | POA: Diagnosis not present

## 2019-08-25 DIAGNOSIS — R1310 Dysphagia, unspecified: Secondary | ICD-10-CM | POA: Diagnosis not present

## 2019-09-14 ENCOUNTER — Other Ambulatory Visit: Payer: Self-pay

## 2019-09-14 ENCOUNTER — Ambulatory Visit (INDEPENDENT_AMBULATORY_CARE_PROVIDER_SITE_OTHER): Payer: PPO | Admitting: Obstetrics & Gynecology

## 2019-09-14 ENCOUNTER — Encounter: Payer: Self-pay | Admitting: Obstetrics & Gynecology

## 2019-09-14 VITALS — BP 112/69 | HR 102 | Wt 155.3 lb

## 2019-09-14 DIAGNOSIS — N813 Complete uterovaginal prolapse: Secondary | ICD-10-CM | POA: Diagnosis not present

## 2019-09-14 NOTE — Patient Instructions (Signed)
Pelvic Organ Prolapse Pelvic organ prolapse is the stretching, bulging, or dropping of pelvic organs into an abnormal position. It happens when the muscles and tissues that surround and support pelvic structures become weak or stretched. Pelvic organ prolapse can involve the:  Vagina (vaginal prolapse).  Uterus (uterine prolapse).  Bladder (cystocele).  Rectum (rectocele).  Intestines (enterocele). When organs other than the vagina are involved, they often bulge into the vagina or protrude from the vagina, depending on how severe the prolapse is. What are the causes? This condition may be caused by:  Pregnancy, labor, and childbirth.  Past pelvic surgery.  Decreased production of the hormone estrogen associated with menopause.  Consistently lifting more than 50 lb (23 kg).  Obesity.  Long-term inability to pass stool (chronic constipation).  A cough that lasts a long time (chronic).  Buildup of fluid in the abdomen due to certain diseases and other conditions. What are the signs or symptoms? Symptoms of this condition include:  Passing a little urine (loss of bladder control) when you cough, sneeze, strain, and exercise (stress incontinence). This may be worse immediately after childbirth. It may gradually improve over time.  Feeling pressure in your pelvis or vagina. This pressure may increase when you cough or when you are passing stool.  A bulge that protrudes from the opening of your vagina.  Difficulty passing urine or stool.  Pain in your lower back.  Pain, discomfort, or disinterest in sex.  Repeated bladder infections (urinary tract infections).  Difficulty inserting a tampon. In some people, this condition causes no symptoms. How is this diagnosed? This condition may be diagnosed based on a vaginal and rectal exam. During the exam, you may be asked to cough and strain while you are lying down, sitting, and standing up. Your health care provider will  determine if other tests are required, such as bladder function tests. How is this treated? Treatment for this condition may depend on your symptoms. Treatment may include:  Lifestyle changes, such as changes to your diet.  Emptying your bladder at scheduled times (bladder training therapy). This can help reduce or avoid urinary incontinence.  Estrogen. Estrogen may help mild prolapse by increasing the strength and tone of pelvic floor muscles.  Kegel exercises. These may help mild cases of prolapse by strengthening and tightening the muscles of the pelvic floor.  A soft, flexible device that helps support the vaginal walls and keep pelvic organs in place (pessary). This is inserted into your vagina by your health care provider.  Surgery. This is often the only form of treatment for severe prolapse. Follow these instructions at home:  Avoid drinking beverages that contain caffeine or alcohol.  Increase your intake of high-fiber foods. This can help decrease constipation and straining during bowel movements.  Lose weight if recommended by your health care provider.  Wear a sanitary pad or adult diapers if you have urinary incontinence.  Avoid heavy lifting and straining with exercise and work. Do not hold your breath when you perform mild to moderate lifting and exercise activities. Limit your activities as directed by your health care provider.  Do Kegel exercises as directed by your health care provider. To do this: ? Squeeze your pelvic floor muscles tight. You should feel a tight lift in your rectal area and a tightness in your vaginal area. Keep your stomach, buttocks, and legs relaxed. ? Hold the muscles tight for up to 10 seconds. ? Relax your muscles. ? Repeat this exercise 50 times a day,   or as many times as told by your health care provider. Continue to do this exercise for at least 4-6 weeks, or for as long as told by your health care provider.  Take over-the-counter and  prescription medicines only as told by your health care provider.  If you have a pessary, take care of it as told by your health care provider.  Keep all follow-up visits as told by your health care provider. This is important. Contact a health care provider if you:  Have symptoms that interfere with your daily activities or sex life.  Need medicine to help with the discomfort.  Notice bleeding from your vagina that is not related to your period.  Have a fever.  Have pain or bleeding when you urinate.  Have bleeding when you pass stool.  Pass urine when you have sex.  Have chronic constipation.  Have a pessary that falls out.  Have bad smelling vaginal discharge.  Have an unusual, low pain in your abdomen. Summary  Pelvic organ prolapse is the stretching, bulging, or dropping of pelvic organs into an abnormal position. It happens when the muscles and tissues that surround and support pelvic structures become weak or stretched.  When organs other than the vagina are involved, they often bulge into the vagina or protrude from the vagina, depending on how severe the prolapse is.  In most cases, this condition needs to be treated only if it produces symptoms. Treatment may include lifestyle changes, estrogen, Kegel exercises, pessary insertion, or surgery.  Avoid heavy lifting and straining with exercise and work. Do not hold your breath when you perform mild to moderate lifting and exercise activities. Limit your activities as directed by your health care provider. This information is not intended to replace advice given to you by your health care provider. Make sure you discuss any questions you have with your health care provider. Document Revised: 03/18/2017 Document Reviewed: 03/18/2017 Elsevier Patient Education  2020 [employer] Inc.  

## 2019-09-14 NOTE — Progress Notes (Signed)
      Pessary Placement Procedure  Patient with uterine procidentia, here for pessary placement.  Was already evaluated for this at previous visit.  Based on measurements from last visit, patient was fitted with #4 ring with support pessary tester.  This gave good support of the uterus and she ambulated with this in place without any prolapse or concerns. Ambulated for a few minutes.  Tester removed, and actual pessary placed.  She will return in two weeks for reevaluation. Was told to let us know if any problems develop. Was also told to bring it back if it falls out, may need a bigger size.    Verita Schneiders, MD, [place] for [employer], [place]

## 2019-09-30 ENCOUNTER — Other Ambulatory Visit: Payer: Self-pay

## 2019-09-30 ENCOUNTER — Ambulatory Visit (INDEPENDENT_AMBULATORY_CARE_PROVIDER_SITE_OTHER): Payer: PPO | Admitting: Obstetrics & Gynecology

## 2019-09-30 ENCOUNTER — Encounter: Payer: Self-pay | Admitting: Obstetrics & Gynecology

## 2019-09-30 VITALS — BP 99/63 | HR 92 | Wt 156.8 lb

## 2019-09-30 DIAGNOSIS — Z4689 Encounter for fitting and adjustment of other specified devices: Secondary | ICD-10-CM

## 2019-09-30 NOTE — Progress Notes (Signed)
    Pessary Placement Follow up Note  Patient with uterine procidentia, here for pessary check.  #4 Ring with support pessary placed on 09/14/2019. Since then, she had no further prolapse noted. No issues with the pessary.  Blood pressure (!) 99/63, pulse 92, weight 156 lb 12.8 oz (71.1 kg). On pelvic exam, healing atrophic introitus, no prolapse.  Pessary in place. Pessary removed, this inadvertently caused superficial laceration on left side of introitus that bled significantly. Hemostasis obtained using pressure and application of sliver nitrate. Pessary was cleaned with soap and water and replaced.   She will return in two months for pessary maintenance.  Bleeding precautions reviewed.   Kayla Schneiders, MD, [place] for [employer], [place]

## 2019-10-12 ENCOUNTER — Ambulatory Visit: Payer: PPO

## 2019-10-12 ENCOUNTER — Other Ambulatory Visit: Payer: PPO

## 2019-11-08 DIAGNOSIS — C3490 Malignant neoplasm of unspecified part of unspecified bronchus or lung: Secondary | ICD-10-CM | POA: Diagnosis not present

## 2019-11-08 DIAGNOSIS — E7800 Pure hypercholesterolemia, unspecified: Secondary | ICD-10-CM | POA: Diagnosis not present

## 2019-11-08 DIAGNOSIS — J438 Other emphysema: Secondary | ICD-10-CM | POA: Diagnosis not present

## 2019-11-08 DIAGNOSIS — I10 Essential (primary) hypertension: Secondary | ICD-10-CM | POA: Diagnosis not present

## 2019-11-08 DIAGNOSIS — E039 Hypothyroidism, unspecified: Secondary | ICD-10-CM | POA: Diagnosis not present

## 2019-11-15 ENCOUNTER — Ambulatory Visit
Admission: RE | Admit: 2019-11-15 | Discharge: 2019-11-15 | Disposition: A | Discharge status: Home / Self Care | Payer: PPO | Source: Ambulatory Visit | Attending: Women's Health | Admitting: Women's Health

## 2019-11-15 ENCOUNTER — Other Ambulatory Visit: Payer: Self-pay

## 2019-11-15 DIAGNOSIS — Z1231 Encounter for screening mammogram for malignant neoplasm of breast: Secondary | ICD-10-CM | POA: Diagnosis not present

## 2019-11-15 DIAGNOSIS — Z01419 Encounter for gynecological examination (general) (routine) without abnormal findings: Secondary | ICD-10-CM

## 2019-11-15 DIAGNOSIS — M85851 Other specified disorders of bone density and structure, right thigh: Secondary | ICD-10-CM | POA: Diagnosis not present

## 2019-11-15 DIAGNOSIS — Z780 Asymptomatic menopausal state: Secondary | ICD-10-CM | POA: Diagnosis not present

## 2019-11-17 ENCOUNTER — Telehealth: Payer: Self-pay | Admitting: *Deleted

## 2019-11-17 ENCOUNTER — Encounter: Payer: Self-pay | Admitting: Women's Health

## 2019-11-17 DIAGNOSIS — Z780 Asymptomatic menopausal state: Secondary | ICD-10-CM | POA: Insufficient documentation

## 2019-11-17 DIAGNOSIS — M8580 Other specified disorders of bone density and structure, unspecified site: Secondary | ICD-10-CM | POA: Insufficient documentation

## 2019-11-17 NOTE — Telephone Encounter (Signed)
Called pt and informed her of abnormal bone density scan. The abnormality was explained as areas of thinning of her bones. The recommendation is for her to contact her PCP Dr. Kenton Kingfisher for follow up and plan of care. Pt voiced understanding of information and instructions given.

## 2019-11-18 ENCOUNTER — Other Ambulatory Visit: Payer: Self-pay | Admitting: Women's Health

## 2019-11-18 DIAGNOSIS — R928 Other abnormal and inconclusive findings on diagnostic imaging of breast: Secondary | ICD-10-CM

## 2019-12-05 ENCOUNTER — Encounter: Payer: Self-pay | Admitting: Obstetrics & Gynecology

## 2019-12-05 ENCOUNTER — Other Ambulatory Visit: Payer: Self-pay

## 2019-12-05 ENCOUNTER — Ambulatory Visit (INDEPENDENT_AMBULATORY_CARE_PROVIDER_SITE_OTHER): Payer: PPO | Admitting: Obstetrics & Gynecology

## 2019-12-05 VITALS — BP 97/59 | HR 97 | Wt 157.1 lb

## 2019-12-05 DIAGNOSIS — N813 Complete uterovaginal prolapse: Secondary | ICD-10-CM | POA: Diagnosis not present

## 2019-12-05 DIAGNOSIS — Z4689 Encounter for fitting and adjustment of other specified devices: Secondary | ICD-10-CM

## 2019-12-05 NOTE — Patient Instructions (Signed)
Return to clinic for any scheduled appointments or for any gynecologic concerns as needed.   

## 2019-12-05 NOTE — Progress Notes (Signed)
   GYNECOLOGY OFFICE VISIT NOTE  Kayla Wyatt is a 64 y.o. PMP with uterine procidentia, here for pessary maintenance.  #4 Ring with support pessary in place. No issues with the pessary, satisfied with it.    Blood pressure (!) 97/59, pulse 97, weight 157 lb 1.6 oz (71.3 kg).  On pelvic exam, atrophic introitus noted but no prolapse.  Pessary in place. After adequate lubrication of the introitus, the pessary was removed.  It was cleaned with soap and water and replaced. Patient tolerated the procedure well.    She will return in three months for pessary maintenance.  Bleeding precautions reviewed.    Verita Schneiders, MD, [place] for [employer], [place]

## 2019-12-06 ENCOUNTER — Other Ambulatory Visit: Payer: PPO

## 2019-12-07 DIAGNOSIS — E7800 Pure hypercholesterolemia, unspecified: Secondary | ICD-10-CM | POA: Diagnosis not present

## 2019-12-07 DIAGNOSIS — I10 Essential (primary) hypertension: Secondary | ICD-10-CM | POA: Diagnosis not present

## 2019-12-07 DIAGNOSIS — C3490 Malignant neoplasm of unspecified part of unspecified bronchus or lung: Secondary | ICD-10-CM | POA: Diagnosis not present

## 2019-12-07 DIAGNOSIS — J438 Other emphysema: Secondary | ICD-10-CM | POA: Diagnosis not present

## 2019-12-07 DIAGNOSIS — E039 Hypothyroidism, unspecified: Secondary | ICD-10-CM | POA: Diagnosis not present

## 2019-12-15 ENCOUNTER — Other Ambulatory Visit: Payer: Self-pay

## 2019-12-15 ENCOUNTER — Inpatient Hospital Stay: Payer: PPO | Attending: Hematology

## 2019-12-15 ENCOUNTER — Inpatient Hospital Stay (HOSPITAL_BASED_OUTPATIENT_CLINIC_OR_DEPARTMENT_OTHER): Payer: PPO | Admitting: Hematology

## 2019-12-15 VITALS — BP 143/54 | HR 104 | Temp 95.8°F | Resp 18 | Ht 63.0 in | Wt 159.0 lb

## 2019-12-15 DIAGNOSIS — M8580 Other specified disorders of bone density and structure, unspecified site: Secondary | ICD-10-CM | POA: Diagnosis not present

## 2019-12-15 DIAGNOSIS — Z86718 Personal history of other venous thrombosis and embolism: Secondary | ICD-10-CM | POA: Insufficient documentation

## 2019-12-15 DIAGNOSIS — C3490 Malignant neoplasm of unspecified part of unspecified bronchus or lung: Secondary | ICD-10-CM | POA: Diagnosis not present

## 2019-12-15 DIAGNOSIS — E039 Hypothyroidism, unspecified: Secondary | ICD-10-CM | POA: Insufficient documentation

## 2019-12-15 DIAGNOSIS — Z85118 Personal history of other malignant neoplasm of bronchus and lung: Secondary | ICD-10-CM | POA: Insufficient documentation

## 2019-12-15 DIAGNOSIS — Z87891 Personal history of nicotine dependence: Secondary | ICD-10-CM | POA: Insufficient documentation

## 2019-12-15 LAB — CMP (CANCER CENTER ONLY)
ALT: 10 U/L (ref 0–44)
AST: 16 U/L (ref 15–41)
Albumin: 3.5 g/dL (ref 3.5–5.0)
Alkaline Phosphatase: 73 U/L (ref 38–126)
Anion gap: 8 (ref 5–15)
BUN: 21 mg/dL (ref 8–23)
CO2: 26 mmol/L (ref 22–32)
Calcium: 9.7 mg/dL (ref 8.9–10.3)
Chloride: 106 mmol/L (ref 98–111)
Creatinine: 0.85 mg/dL (ref 0.44–1.00)
GFR, Estimated: >60 mL/min (ref >60)
Glucose, Bld: 85 mg/dL (ref 70–99)
Potassium: 3.4 mmol/L — ABNORMAL LOW (ref 3.5–5.1)
Sodium: 140 mmol/L (ref 135–145)
Total Bilirubin: 0.3 mg/dL (ref 0.3–1.2)
Total Protein: 7.1 g/dL (ref 6.5–8.1)

## 2019-12-15 LAB — CBC WITH DIFFERENTIAL/PLATELET
Abs Immature Granulocytes: 0.01 10*3/uL (ref 0.00–0.07)
Basophils Absolute: 0.1 10*3/uL (ref 0.0–0.1)
Basophils Relative: 1 %
Eosinophils Absolute: 0.2 10*3/uL (ref 0.0–0.5)
Eosinophils Relative: 3 %
HCT: 32 % — ABNORMAL LOW (ref 36.0–46.0)
Hemoglobin: 10.4 g/dL — ABNORMAL LOW (ref 12.0–15.0)
Immature Granulocytes: 0 %
Lymphocytes Relative: 21 %
Lymphs Abs: 1.2 10*3/uL (ref 0.7–4.0)
MCH: 28.4 pg (ref 26.0–34.0)
MCHC: 32.5 g/dL (ref 30.0–36.0)
MCV: 87.4 fL (ref 80.0–100.0)
Monocytes Absolute: 0.6 10*3/uL (ref 0.1–1.0)
Monocytes Relative: 10 %
Neutro Abs: 3.8 10*3/uL (ref 1.7–7.7)
Neutrophils Relative %: 65 %
Platelets: 246 10*3/uL (ref 150–400)
RBC: 3.66 MIL/uL — ABNORMAL LOW (ref 3.87–5.11)
RDW: 14.4 % (ref 11.5–15.5)
WBC: 5.8 10*3/uL (ref 4.0–10.5)
nRBC: 0 % (ref 0.0–0.2)

## 2019-12-15 NOTE — Progress Notes (Signed)
Kayla Wyatt    HEMATOLOGY/ONCOLOGY CLINIC NOTE  Date of Service: 12/15/2019   Patient Care Team: [name], MD as PCP - General (Family Medicine)  CHIEF COMPLAINTS/PURPOSE OF CONSULTATION:   F/u for continued management of Non-Small Cell lung Carcinoma  HISTORY OF PRESENTING ILLNESS:   Kayla Wyatt is a wonderful 64 y.o. female who has been referred to [place] by Dr [name] for evaluation and management of Non-Small Cell Carcinoma. She is accompanied today by her husband. The pt reports that she is doing well overall.   Patient recently had a significant hospitalization on 04/02/2017 - as per DC summary by [name] " with abdominal pain, hematemesis, melena and ABLA due to GIB.  She underwent clipping of gastric ulcer and received multiple units PRBC but continued to have drop in H/H with bloody stool. NM GI scan was negative for bleeding and she underwent colonoscopy by Dr. [name] 1/31 showing diverticulosis but no signs of bleeding. Hospital course significant for R- gastrocnemius DVT, PAF, incidental findings of mediastinal mass as well as brief episode of hematuria. She was cleared to start on Xarelto on 02/01 for treatment of DVT. On 2/2 am she developed hematemesis with maroon stools and hypotension requiring fluid bolus as well as 2 units PRBCs. She continued to decline requiring intubation, pressors as well as reversal of anticoagulation with Kcentra on 02/3. She underwent UGI revealing large clot with fresh blood in stomach and underwent mesenteric arteriogram with percutaneous coil embolization fo distal tributary of left gastric artery and placement of IVC filter by interventional radiology.   A fib with RVR felt to be due to hemorrhagic shock and she converted to NSR. [name] felt no further cardiac work up indicated and did not recommend initiating anticoagulation in short term. To follow up with [name] after PET scan and routine biopsy after medically stable. She  continued to have bleeding and underwent visceral angiogram with embolization of inferior division of splenic artery and associated gastric arteries on 2/4. No surgical intervention needed per [name] with recommendations to continue monitoring H/H as well as for recurrence of hematochezia. She tolerated extubation on 2/6 and respiratory status improving.  On 2/11, she has episode of unresponsiveness with jerking of bilateral limbs that lasted about 3 minutes. She had amnesia of events but was back to baseline. EEG revealed "focal slowing over the right temporo-occipital regionandoccasional epileptiform discharges over the right occipital region". Head CT reviewed, unremarkable for acute intracranial process. MRI brain done revealing mild biparietal signal abnormality suspicious for PRES. [name] recommended starting patient on Keppra BID with repeat MRI in 2 weeks. New onset seizure likely provoked by PRES--if resolved--Ok to take patient off Keppra. Patient with resultant generalized weakness. CIR recommended due to functional deficits. "  Later as outpatient she had a PET scan on 05/14/17 which showed a hypermetabolic mediastinal mass which was subsequently biopsied with a bronchoscopy on 05/19/17. She notes that she had smoked for about 35-40 years, half a pack of a day. She reports that she has not had any other symptoms related to her smoking; she has never had to be on home oxygen or inhalers. She has recently begun Symbicort. While being in the hospital she lost about 20 lbs but has maintained her weight since being discharged.  The pt and her husband note that she is concerned about what to do if she is not able to go back to work after her 90-day leave. We recommended that they speak with  our social workers here to explore the available options for insurance coverage /disability application etc.  Of note prior to the patient's visit today, pt has had PET/CT completed on 05/14/17 with  results revealing 1. The known mediastinal mass is intensely hypermetabolic, worrisome for malignancy. This could reflect lymphoma or small cell lung cancer. Tissue sampling recommended.2. No peripheral lung mass identified. New patchy ground-glass opacity in the lingula is mildly hypermetabolic and likely inflammatory.  On 05/19/17 the pt had a Fine Needle Aspiration which revealed Non-Small cell lung carcinoma.  Most recent lab results (05/19/17) of CBC  is as follows: all values are WNL except for RBC at 2.90, Hgb at 8.6, HCT at 28.0, RDW at 16.4.  On review of systems, pt reports knee pain, and denies abdominal pains, leg swelling, SOB, CP, and any other symptoms.   On PMHx the pt reports acute blood loss anemia, GI hemorrhage with melena, hypercholesteremia, HTN, persistent atrial fibrillation with rapid ventricular response, Vitamin D deficiency, denies liver and kidney problems. On Social Hx the pt reports having smoked for 35-40 years of half a pack per day. She quit smoking on 03/31/17.  On Family Hx the pt reports that her father had heart disease.  Interval History:   Kayla Wyatt returns today regarding her Non-Small Cell Carcinoma. The patient's last visit with [place] was on 08/17/2019. The pt reports that she is doing well overall.  The pt reports no acute new concerns. Eating well. No chest pain or shortness of breath or weight loss.  Lab results today (12/15/19) of CBC w/diff and CMP reviewed.   MEDICAL HISTORY:  Past Medical History:  Diagnosis Date  . Acute blood loss anemia 04/02/2017  . Anxiety   . Gastrointestinal hemorrhage with melena   . Headache   . Hypercholesteremia   . Hypertension   . lung ca dx'd 05/2017  . Persistent atrial fibrillation with rapid ventricular response ([place]) 04/07/2017  . Seizures ([place])    04/2017 while in hospital at [facility]  . Vitamin D deficiency     SURGICAL HISTORY: Past Surgical History:  Procedure Laterality Date  . COLONOSCOPY Left  04/09/2017   Procedure: COLONOSCOPY;  Surgeon: Carol Ada, MD;  Location: [place];  Service: Endoscopy;  Laterality: Left;  . ESOPHAGOGASTRODUODENOSCOPY N/A 04/03/2017   Procedure: ESOPHAGOGASTRODUODENOSCOPY (EGD);  Surgeon: Carol Ada, MD;  Location: [place];  Service: Endoscopy;  Laterality: N/A;  . ESOPHAGOGASTRODUODENOSCOPY N/A 08/10/2019   Procedure: ESOPHAGOGASTRODUODENOSCOPY (EGD);  Surgeon: Juanita Craver, MD;  Location: [facility];  Service: Endoscopy;  Laterality: N/A;  . ESOPHAGOGASTRODUODENOSCOPY (EGD) WITH PROPOFOL N/A 04/12/2017   Procedure: ESOPHAGOGASTRODUODENOSCOPY (EGD) WITH PROPOFOL;  Surgeon: Milus Banister, MD;  Location: [facility] ENDOSCOPY;  Service: Endoscopy;  Laterality: N/A;  . FOREIGN BODY REMOVAL N/A 08/10/2019   Procedure: FOREIGN BODY REMOVAL;  Surgeon: Juanita Craver, MD;  Location: [facility] ENDOSCOPY;  Service: Endoscopy;  Laterality: N/A;  . IR ANGIOGRAM SELECTIVE EACH ADDITIONAL VESSEL  04/12/2017  . IR ANGIOGRAM SELECTIVE EACH ADDITIONAL VESSEL  04/13/2017  . IR ANGIOGRAM SELECTIVE EACH ADDITIONAL VESSEL  04/13/2017  . IR ANGIOGRAM SELECTIVE EACH ADDITIONAL VESSEL  04/13/2017  . IR ANGIOGRAM VISCERAL SELECTIVE  04/12/2017  . IR ANGIOGRAM VISCERAL SELECTIVE  04/13/2017  . IR EMBO ART  VEN HEMORR LYMPH EXTRAV  INC GUIDE ROADMAPPING  04/12/2017  . IR EMBO ART  VEN HEMORR LYMPH EXTRAV  INC GUIDE ROADMAPPING  04/13/2017  . IR FLUORO GUIDE CV LINE RIGHT  04/13/2017  . IR FLUORO GUIDE  PORT INSERTION RIGHT  07/27/2017  . IR IVC FILTER PLMT / S&I /IMG GUID/MOD SED  04/12/2017  . IR US GUIDE VASC ACCESS RIGHT  04/12/2017  . IR US GUIDE VASC ACCESS RIGHT  04/13/2017  . IR US GUIDE VASC ACCESS RIGHT  04/13/2017  . IR US GUIDE VASC ACCESS RIGHT  07/27/2017  . LEG SURGERY  1974   Blood Clot Removal   . VIDEO BRONCHOSCOPY WITH ENDOBRONCHIAL ULTRASOUND N/A 05/19/2017   Procedure: VIDEO BRONCHOSCOPY WITH ENDOBRONCHIAL ULTRASOUND;  Surgeon: Grace Isaac, MD;  Location: [place];  Service: Thoracic;   Laterality: N/A;    SOCIAL HISTORY: Social History   Socioeconomic History  . Marital status: Single    Spouse name: Not on file  . Number of children: Not on file  . Years of education: Not on file  . Highest education level: Not on file  Occupational History  . Occupation: Has to lift heavy boxes at times.    Employer: [employer]  Tobacco Use  . Smoking status: Former Smoker    Packs/day: 0.50    Years: 40.00    Pack years: 20.00    Types: Cigarettes    Quit date: 03/31/2017    Years since quitting: 2.7  . Smokeless tobacco: Never Used  Vaping Use  . Vaping Use: Never used  Substance and Sexual Activity  . Alcohol use: No  . Drug use: No  . Sexual activity: Not Currently  Other Topics Concern  . Not on file  Social History Narrative  . Not on file   Social Determinants of Health   Financial Resource Strain:   . Difficulty of Paying Living Expenses: Not on file  Food Insecurity: No Food Insecurity  . Worried About Charity fundraiser in the Last Year: Never true  . Ran Out of Food in the Last Year: Never true  Transportation Needs: No Transportation Needs  . Lack of Transportation (Medical): No  . Lack of Transportation (Non-Medical): No  Physical Activity:   . Days of Exercise per Week: Not on file  . Minutes of Exercise per Session: Not on file  Stress:   . Feeling of Stress : Not on file  Social Connections:   . Frequency of Communication with Friends and Family: Not on file  . Frequency of Social Gatherings with Friends and Family: Not on file  . Attends Religious Services: Not on file  . Active Member of Clubs or Organizations: Not on file  . Attends Archivist Meetings: Not on file  . Marital Status: Not on file  Intimate Partner Violence:   . Fear of Current or Ex-Partner: Not on file  . Emotionally Abused: Not on file  . Physically Abused: Not on file  . Sexually Abused: Not on file    FAMILY HISTORY: Family History  Problem Relation  Age of Onset  . Heart disease Father     ALLERGIES:  has No Known Allergies.  MEDICATIONS:  Current Outpatient Medications  Medication Sig Dispense Refill  . acetaminophen (TYLENOL) 325 MG tablet Take 1-2 tablets (325-650 mg total) by mouth every 4 (four) hours as needed for mild pain.    . cholecalciferol (VITAMIN D) 1000 units tablet Take 1,000 Units by mouth 2 (two) times daily.    Kayla Wyatt levothyroxine (SYNTHROID) 88 MCG tablet TAKE 1 TABLET(88 MCG) BY MOUTH DAILY (Patient taking differently: Take 88 mcg by mouth daily before breakfast. TAKE 1 TABLET(88 MCG) BY MOUTH DAILY) 30 tablet 0  .  lidocaine-prilocaine (EMLA) cream Apply to affected area once 30 g 3  . lisinopril-hydrochlorothiazide (PRINZIDE,ZESTORETIC) 20-12.5 MG tablet Take 1 tablet by mouth daily.    . pantoprazole (PROTONIX) 40 MG tablet Take 1 tablet (40 mg total) by mouth daily. 30 tablet 3  . triamcinolone cream (KENALOG) 0.1 % Apply 1 application topically 2 (two) times daily.      No current facility-administered medications for this visit.   Facility-Administered Medications Ordered in Other Visits  Medication Dose Route Frequency Provider Last Rate Last Admin  . sodium chloride flush (NS) 0.9 % injection 10 mL  10 mL Intracatheter PRN Brunetta Genera, MD   10 mL at 08/10/18 1558    REVIEW OF SYSTEMS:   A 10+ POINT REVIEW OF SYSTEMS WAS OBTAINED including neurology, dermatology, psychiatry, cardiac, respiratory, lymph, extremities, GI, GU, Musculoskeletal, constitutional, breasts, reproductive, HEENT.  All pertinent positives are noted in the HPI.  All others are negative.   PHYSICAL EXAMINATION: ECOG FS:1 - Symptomatic but completely ambulatory  There were no vitals filed for this visit. Wt Readings from Last 3 Encounters:  12/05/19 157 lb 1.6 oz (71.3 kg)  09/30/19 156 lb 12.8 oz (71.1 kg)  09/14/19 155 lb 4.8 oz (70.4 kg)   There is no height or weight on file to calculate BMI.    NAD GENERAL:alert, in  no acute distress and comfortable SKIN: no acute rashes, no significant lesions EYES: conjunctiva are pink and non-injected, sclera anicteric OROPHARYNX: MMM, no exudates, no oropharyngeal erythema or ulceration NECK: supple, no JVD LYMPH:  no palpable lymphadenopathy in the cervical, axillary or inguinal regions LUNGS: clear to auscultation b/l with normal respiratory effort HEART: regular rate & rhythm ABDOMEN:  normoactive bowel sounds , non tender, not distended. No palpable hepatosplenomegaly.  Extremity: no pedal edema PSYCH: alert & oriented x 3 with fluent speech NEURO: no focal motor/sensory deficits  LABORATORY DATA:  I have reviewed the data as listed  . CBC Latest Ref Rng & Units 12/15/2019 07/27/2019 05/11/2019  WBC 4.0 - 10.5 K/uL 5.8 9.0 6.4  Hemoglobin 12.0 - 15.0 g/dL 10.4(L) 9.8(L) 11.2(L)  Hematocrit 36 - 46 % 32.0(L) 30.3(L) 34.5(L)  Platelets 150 - 400 K/uL 246 282 292    CMP Latest Ref Rng & Units 12/15/2019 07/27/2019 05/11/2019  Glucose 70 - 99 mg/dL 85 98 84  BUN 8 - 23 mg/dL 21 20 25(H)  Creatinine 0.44 - 1.00 mg/dL 0.85 0.90 1.25(H)  Sodium 135 - 145 mmol/L 140 141 145  Potassium 3.5 - 5.1 mmol/L 3.4(L) 3.8 4.3  Chloride 98 - 111 mmol/L 106 105 107  CO2 22 - 32 mmol/L $RemoveB'26 26 27  'calMpIOL$ Calcium 8.9 - 10.3 mg/dL 9.7 9.2 10.1  Total Protein 6.5 - 8.1 g/dL 7.1 6.7 7.1  Total Bilirubin 0.3 - 1.2 mg/dL 0.3 0.2(L) 0.2(L)  Alkaline Phos 38 - 126 U/L 73 69 64  AST 15 - 41 U/L $Remo'16 18 23  'LlRRK$ ALT 0 - 44 U/L $Remo'10 13 12  'ueeTE$ 01/26/2019 CT Abdomen Pelvis W Contrast (Accession [id number])    01/26/2019 CT Chest W Contrast (Accession [id number])   05/19/17 Fine Needle Aspiration:  05/19/17 Bronchial Washing Specimen B:   RADIOGRAPHIC STUDIES: I have personally reviewed the radiological images as listed and agreed with the findings in the report. DG BONE DENSITY (DXA)  Result Date: 11/15/2019 EXAM: DUAL X-RAY ABSORPTIOMETRY (DXA) FOR BONE MINERAL DENSITY IMPRESSION: Referring  Physician:  [place] Your patient completed a BMD test using Lunar IDXA DXA  system ( analysis version: 16 ) manufactured by [employer]. Technologist: AW PATIENT: Name: Yuritzi, Kamp Patient ID: [id number] Birth Date: 03/29/1955 Height: 63.0 in. Sex: Female Measured: 11/15/2019 Weight: 158.2 lbs. Indications: Caucasian, Estrogen Deficient, Hypothyroid, Levothyroxine, Pantoprazole, Postmenopausal Fractures: Forearm Treatments: Vitamin D (E933.5) ASSESSMENT: The BMD measured at Femur Neck Right is 0.755 g/cm2 with a T-score of -2.0. This patient is considered osteopenic according to [facility]) criteria. The scan quality is good. Site Region Measured Date Measured Age YA BMD Significant CHANGE T-score DualFemur Neck Right 11/15/2019    64.6         -2.0    0.755 g/cm2 AP Spine  L1-L4      9/15/2019    [age]         -0.7    1.095 g/cm2 DualFemur Total Mean 11/15/2019    66.4         -1.0    0.876 g/cm2 World Health Organization [facility]) criteria for post-menopausal, Caucasian Women: Normal       T-score at or above -1 SD Osteopenia   T-score between -1 and -2.5 SD Osteoporosis T-score at or below -2.5 SD RECOMMENDATION: 1. All patients should optimize calcium and vitamin D intake. 2. Consider FDA approved medical therapies in postmenopausal women and men aged 66 years and older, based on the following: a. A hip or vertebral (clinical or morphometric) fracture b. T- score < or = -2.5 at the femoral neck or spine after appropriate evaluation to exclude secondary causes c. Low bone mass (T-score between -1.0 and -2.5 at the femoral neck or spine) and a 10 year probability of a hip fracture > or = 3% or a 10 year probability of a major osteoporosis-related fracture > or = 20% based on the [place]-adapted WHO algorithm d. Clinician judgment and/or patient preferences may indicate treatment for people with 10-year fracture probabilities above or below these levels FOLLOW-UP: Patients with diagnosis of  osteoporosis or at high risk for fracture should have regular bone mineral density tests. For patients eligible for Medicare, routine testing is allowed once every 2 years. The testing frequency can be increased to one year for patients who have rapidly progressing disease, those who are receiving or discontinuing medical therapy to restore bone mass, or have additional risk factors. I have reviewed this report and agree with the above findings. [facility] FRAX* 10-year Probability of Fracture Based on femoral neck BMD: DualFemur (Right) Major Osteoporotic Fracture: 11.2% Hip Fracture:                1.9% Population:                  [place] (Caucasian) Risk Factors:                None *FRAX is a Materials engineer of the [employer] of [employer] for Metabolic Bone Disease, a World Pharmacologist (WHO) [facility]. ASSESSMENT: The probability of a major osteoporotic fracture is 11.2 % within the next ten years. The probability of a hip fracture is 1.9 % within the next ten years. Electronically Signed   By: Lowella Grip III M.D.   On: 11/15/2019 14:10   MM 3D SCREEN BREAST BILATERAL  Result Date: 11/17/2019 CLINICAL DATA:  Screening. EXAM: DIGITAL SCREENING BILATERAL MAMMOGRAM WITH TOMO AND CAD COMPARISON:  None. ACR Breast Density Category c: The breast tissue is heterogeneously dense, which may obscure small masses. FINDINGS: In the right breast possible distortion requires further  evaluation. In the left breast mass requires further evaluation. Images were processed with CAD. IMPRESSION: Further evaluation is suggested for possible distortion in the right breast. Further evaluation is suggested for possible mass in the left breast. RECOMMENDATION: Diagnostic mammogram and possibly ultrasound of both breasts. (Code:[id number]) The patient will be contacted regarding the findings, and additional imaging will be scheduled. BI-RADS CATEGORY  0: Incomplete. Need additional  imaging evaluation and/or prior mammograms for comparison. Electronically Signed   By: Edwin Cap M.D.   On: 11/17/2019 08:35   MRI brain 04/21/2017: IMPRESSION: 1. Motion degraded examination. 2. 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 3. Otherwise negative MRI of the head with and without contrast for age.  Electronically Signed   By: Awilda Metro M.D.   On: 04/22/2017 00:34    ASSESSMENT & PLAN:   64 y.o. female with  1.  Non-Small Cell Carcinoma atleast Stage IIIB  Not enough tissue for further characterization or mutation testing. Presenting with mediastinal nodal mass - TxN2 Mx (Atleast Stage III disease) ? Lingular primary vs inflammation. No overt evidence of metastatic disease.  -EGFR mutation studies on blood negative. Patient is s/p 3 cycles of Carboplatin/taxol -resolution left upper lobe ground-glass opacity has resolved in the interval. 08/19/17 PET revealed  No significant change in size of hypermetabolic AP window mass. The degree of FDG uptake is slightly increased in the interval. 2. Persistent hypermetabolic focus involving the distal aspect of the tongue within SUV max of 16.46. Similar to previous exam. (no clinical co-relate). Lung involvement has decreased.   08/19/17 Brain MRI revealed No evidence of intracranial metastases. Interval resolution of bilateral parietal signal abnormality favoring PRES  Completed concurrent Carbo/taxol chemotherapy + RT  12/24/17 PET/CT revealed Marked interval response to therapy with large AP window lymph nodes seen on the previous study almost imperceptible on today's exam. Hypermetabolism within this lesion has resolved with FDG accumulation now at background blood pool levels. 2. Small focus of hypermetabolism in the anal region, similar to prior and indeterminate by PET imaging. 3. Hypermetabolic focus at the distal tongue has decreased in the Interval. 4.  Marked pelvic floor laxity with cystocele. 5. Stable tiny left lung nodules. 6.  Aortic Atherosclerois. 7.  Emphysema.   04/03/17 endoscopy did not reveal esophageal thickening    04/27/18 CT Chest revealed Stable exam. No new or progressive findings. 2. Stable mild circumferential wall thickening mid esophagus. Esophagitis is a consideration. 3. Stable tiny left pulmonary nodules. 4.  Emphysema. 5.  Aortic Atherosclerois.  01/26/2019 CT Abdomen Pelvis W Contrast (Accession [id number]) revealed "1. Patchy areas of consolidation, bronchiectasis and ground-glass in the medial left hemithorax, presumably treatment related. No evidence of metastatic disease.2. Extensive debris in the mainstem bronchi, bronchus intermedius and left upper lobe bronchus."    07/27/2019 CT Chest ([id number]) revealed "Nodularity along the pleural surface in the posterior left chest."  2. Bone Pain from neulasta - managed with Tylenol  3. Previous h/ot SZ -currently on Keppra. MRI brain - resolution of findings of PRES  4. H/o rt calf DVT - off anticoagulation due to massive GI bleeding. S/p IVC filter. No issues currently  5. S/p previous GI bleeding from Gastric ulcer --needing clipping and arterial embolization. On PPI BID Ulcers seen to be healed with recent April 2020 repeat endoscopy  6. H/o ctx /steroid related thrush --now resolved.  8. Elevated TSH/Hypothyroidism -- ? Related to immunotherapy 07/13/18 Thyroid functions revealed TSH at 62.91 and Free T4 at  0.30 Began low dose 91mcg Levothyroxine and provide endocrinology referral for further optimization. Recommend taking Levothyroxine 1.5 hours before to breakfast with only water and no other medications.  F/u with Dr. Kelton Pillar in Endocrinology   PLAN: -No lab, clinical, or radiographic evidence of Non-Small Cell Carcinoma recurrence at this time. -Bone density stiudy - osteopenia -dscussed optimizing calcium intake and Vit D replacement   FOLLOW  UP: CT chest in 16 weeks RTC with Dr Irene Limbo with labs in 18 weeks   The total time spent in the appt was 20 minutes and more than 50% was on counseling and direct patient cares.  All of the patient's questions were answered with apparent satisfaction. The patient knows to call the clinic with any problems, questions or concerns.   Sullivan Lone MD [place] AAHIVMS [facility] Hematology/Oncology Physician [facility]  (Office):       [phone] (Work cell):  [phone] (Fax):           [phone]  12/15/2019 8:27 AM  I, [name], am acting as a scribe for Dr. Sullivan Lone.   .I have reviewed the above documentation for accuracy and completeness, and I agree with the above. Brunetta Genera MD

## 2019-12-20 ENCOUNTER — Ambulatory Visit
Admission: RE | Admit: 2019-12-20 | Discharge: 2019-12-20 | Disposition: A | Discharge status: Home / Self Care | Payer: PPO | Source: Ambulatory Visit | Attending: Women's Health | Admitting: Women's Health

## 2019-12-20 ENCOUNTER — Other Ambulatory Visit: Payer: Self-pay

## 2019-12-20 ENCOUNTER — Other Ambulatory Visit: Payer: Self-pay | Admitting: Women's Health

## 2019-12-20 DIAGNOSIS — R928 Other abnormal and inconclusive findings on diagnostic imaging of breast: Secondary | ICD-10-CM

## 2019-12-20 DIAGNOSIS — N6489 Other specified disorders of breast: Secondary | ICD-10-CM | POA: Diagnosis not present

## 2019-12-20 DIAGNOSIS — N6320 Unspecified lump in the left breast, unspecified quadrant: Secondary | ICD-10-CM

## 2019-12-27 ENCOUNTER — Other Ambulatory Visit: Payer: PPO

## 2019-12-30 ENCOUNTER — Other Ambulatory Visit: Payer: Self-pay | Admitting: Women's Health

## 2019-12-30 ENCOUNTER — Ambulatory Visit
Admission: RE | Admit: 2019-12-30 | Discharge: 2019-12-30 | Disposition: A | Discharge status: Home / Self Care | Payer: PPO | Source: Ambulatory Visit | Attending: Women's Health | Admitting: Women's Health

## 2019-12-30 ENCOUNTER — Other Ambulatory Visit: Payer: Self-pay

## 2019-12-30 DIAGNOSIS — N62 Hypertrophy of breast: Secondary | ICD-10-CM | POA: Diagnosis not present

## 2019-12-30 DIAGNOSIS — N6489 Other specified disorders of breast: Secondary | ICD-10-CM

## 2019-12-30 DIAGNOSIS — N6320 Unspecified lump in the left breast, unspecified quadrant: Secondary | ICD-10-CM

## 2019-12-30 DIAGNOSIS — R928 Other abnormal and inconclusive findings on diagnostic imaging of breast: Secondary | ICD-10-CM | POA: Diagnosis not present

## 2019-12-30 DIAGNOSIS — N6325 Unspecified lump in the left breast, overlapping quadrants: Secondary | ICD-10-CM | POA: Diagnosis not present

## 2019-12-30 DIAGNOSIS — N6321 Unspecified lump in the left breast, upper outer quadrant: Secondary | ICD-10-CM | POA: Diagnosis not present

## 2020-01-19 ENCOUNTER — Telehealth: Payer: Self-pay | Admitting: Hematology

## 2020-01-19 DIAGNOSIS — C3490 Malignant neoplasm of unspecified part of unspecified bronchus or lung: Secondary | ICD-10-CM | POA: Diagnosis not present

## 2020-01-19 DIAGNOSIS — E039 Hypothyroidism, unspecified: Secondary | ICD-10-CM | POA: Diagnosis not present

## 2020-01-19 DIAGNOSIS — I10 Essential (primary) hypertension: Secondary | ICD-10-CM | POA: Diagnosis not present

## 2020-01-19 DIAGNOSIS — E7800 Pure hypercholesterolemia, unspecified: Secondary | ICD-10-CM | POA: Diagnosis not present

## 2020-01-19 DIAGNOSIS — J438 Other emphysema: Secondary | ICD-10-CM | POA: Diagnosis not present

## 2020-01-19 NOTE — Telephone Encounter (Signed)
Called pt - no answer. No vmail set up - per 11/11 sch msg.

## 2020-02-01 DIAGNOSIS — N6489 Other specified disorders of breast: Secondary | ICD-10-CM | POA: Diagnosis not present

## 2020-03-05 ENCOUNTER — Ambulatory Visit: Payer: PPO | Admitting: Obstetrics & Gynecology

## 2020-03-21 ENCOUNTER — Encounter: Payer: Self-pay | Admitting: Cardiology

## 2020-03-21 ENCOUNTER — Telehealth (INDEPENDENT_AMBULATORY_CARE_PROVIDER_SITE_OTHER): Payer: PPO | Admitting: Cardiology

## 2020-03-23 NOTE — Progress Notes (Signed)
Unable to contact patient.  Kerin Ransom PA-C 03/23/2020 11:48 AM

## 2020-03-28 ENCOUNTER — Encounter: Payer: Self-pay | Admitting: Obstetrics & Gynecology

## 2020-03-28 ENCOUNTER — Ambulatory Visit (INDEPENDENT_AMBULATORY_CARE_PROVIDER_SITE_OTHER): Payer: PPO | Admitting: Obstetrics & Gynecology

## 2020-03-28 ENCOUNTER — Other Ambulatory Visit: Payer: Self-pay

## 2020-03-28 VITALS — BP 118/48 | HR 86 | Ht 63.0 in | Wt 160.8 lb

## 2020-03-28 DIAGNOSIS — Z4689 Encounter for fitting and adjustment of other specified devices: Secondary | ICD-10-CM | POA: Diagnosis not present

## 2020-03-28 DIAGNOSIS — N813 Complete uterovaginal prolapse: Secondary | ICD-10-CM | POA: Diagnosis not present

## 2020-03-28 NOTE — Progress Notes (Signed)
    GYNECOLOGY OFFICE VISIT NOTE   Kayla GAUTHREAUX is a 65 y.o. PMP female with uterine procidentia, here for pessary maintenance.  #4 Ring with support pessary in place. No issues with the pessary, satisfied with this method.    Blood pressure (!) 118/48, pulse 86, height 5\' 3"  (1.6 m), weight 160 lb 12.8 oz (72.9 kg).  On pelvic exam, atrophic introitus noted but no prolapse.  Pessary in place. After adequate lubrication of the introitus, the pessary was removed.  It was cleaned with soap and water.  Vaginal speculum exam done, no lacerations or other concerning lesions noted. Pessary was replaced with adequate lubrication. Patient tolerated the procedure well.    She will return in three months for pessary maintenance.  Bleeding precautions reviewed.       Verita Schneiders, MD, [place] for [employer], [place]

## 2020-04-06 DIAGNOSIS — J438 Other emphysema: Secondary | ICD-10-CM | POA: Diagnosis not present

## 2020-04-06 DIAGNOSIS — E039 Hypothyroidism, unspecified: Secondary | ICD-10-CM | POA: Diagnosis not present

## 2020-04-06 DIAGNOSIS — I10 Essential (primary) hypertension: Secondary | ICD-10-CM | POA: Diagnosis not present

## 2020-04-06 DIAGNOSIS — E7800 Pure hypercholesterolemia, unspecified: Secondary | ICD-10-CM | POA: Diagnosis not present

## 2020-04-06 DIAGNOSIS — C3490 Malignant neoplasm of unspecified part of unspecified bronchus or lung: Secondary | ICD-10-CM | POA: Diagnosis not present

## 2020-04-18 NOTE — Progress Notes (Incomplete)
[name]    HEMATOLOGY/ONCOLOGY CLINIC NOTE  Date of Service: 04/18/2020   Patient Care Team: [name], MD as PCP - General (Family Medicine)  CHIEF COMPLAINTS/PURPOSE OF CONSULTATION:   F/u for continued management of Non-Small Cell lung Carcinoma  HISTORY OF PRESENTING ILLNESS:   Kayla Wyatt is a wonderful 65 y.o. female who has been referred to [place] by Dr [name] for evaluation and management of Non-Small Cell Carcinoma. She is accompanied today by her husband. The pt reports that she is doing well overall.   Patient recently had a significant hospitalization on 04/02/2017 - as per DC summary by [name] " with abdominal pain, hematemesis, melena and ABLA due to GIB.  She underwent clipping of gastric ulcer and received multiple units PRBC but continued to have drop in H/H with bloody stool. NM GI scan was negative for bleeding and she underwent colonoscopy by Dr. [name] 1/31 showing diverticulosis but no signs of bleeding. Hospital course significant for R- gastrocnemius DVT, PAF, incidental findings of mediastinal mass as well as brief episode of hematuria. She was cleared to start on Xarelto on 02/01 for treatment of DVT. On 2/2 am she developed hematemesis with maroon stools and hypotension requiring fluid bolus as well as 2 units PRBCs. She continued to decline requiring intubation, pressors as well as reversal of anticoagulation with Kcentra on 02/3. She underwent UGI revealing large clot with fresh blood in stomach and underwent mesenteric arteriogram with percutaneous coil embolization fo distal tributary of left gastric artery and placement of IVC filter by interventional radiology.   A fib with RVR felt to be due to hemorrhagic shock and she converted to NSR. [name] felt no further cardiac work up indicated and did not recommend initiating anticoagulation in short term. To follow up with [name] after PET scan and routine biopsy after medically stable. She  continued to have bleeding and underwent visceral angiogram with embolization of inferior division of splenic artery and associated gastric arteries on 2/4. No surgical intervention needed per [name] with recommendations to continue monitoring H/H as well as for recurrence of hematochezia. She tolerated extubation on 2/6 and respiratory status improving.  On 2/11, she has episode of unresponsiveness with jerking of bilateral limbs that lasted about 3 minutes. She had amnesia of events but was back to baseline. EEG revealed "focal slowing over the right temporo-occipital regionandoccasional epileptiform discharges over the right occipital region". Head CT reviewed, unremarkable for acute intracranial process. MRI brain done revealing mild biparietal signal abnormality suspicious for PRES. [name] recommended starting patient on Keppra BID with repeat MRI in 2 weeks. New onset seizure likely provoked by PRES--if resolved--Ok to take patient off Keppra. Patient with resultant generalized weakness. CIR recommended due to functional deficits. "  Later as outpatient she had a PET scan on 05/14/17 which showed a hypermetabolic mediastinal mass which was subsequently biopsied with a bronchoscopy on 05/19/17. She notes that she had smoked for about 35-40 years, half a pack of a day. She reports that she has not had any other symptoms related to her smoking; she has never had to be on home oxygen or inhalers. She has recently begun Symbicort. While being in the hospital she lost about 20 lbs but has maintained her weight since being discharged.  The pt and her husband note that she is concerned about what to do if she is not able to go back to work after her 90-day leave. We recommended that they speak with  our social workers here to explore the available options for insurance coverage /disability application etc.  Of note prior to the patient's visit today, pt has had PET/CT completed on 05/14/17 with  results revealing 1. The known mediastinal mass is intensely hypermetabolic, worrisome for malignancy. This could reflect lymphoma or small cell lung cancer. Tissue sampling recommended.2. No peripheral lung mass identified. New patchy ground-glass opacity in the lingula is mildly hypermetabolic and likely inflammatory.  On 05/19/17 the pt had a Fine Needle Aspiration which revealed Non-Small cell lung carcinoma.  Most recent lab results (05/19/17) of CBC  is as follows: all values are WNL except for RBC at 2.90, Hgb at 8.6, HCT at 28.0, RDW at 16.4.  On review of systems, pt reports knee pain, and denies abdominal pains, leg swelling, SOB, CP, and any other symptoms.   On PMHx the pt reports acute blood loss anemia, GI hemorrhage with melena, hypercholesteremia, HTN, persistent atrial fibrillation with rapid ventricular response, Vitamin D deficiency, denies liver and kidney problems. On Social Hx the pt reports having smoked for 35-40 years of half a pack per day. She quit smoking on 03/31/17.  On Family Hx the pt reports that her father had heart disease.  Interval History:   Kayla Wyatt returns today regarding her Non-Small Cell Carcinoma. The patient's last visit with [place] was on 12/15/2019. The pt reports that she is doing well overall.  The pt reports ***  Of note since the patient's last visit, pt has had CT Chest ordered. ***  Lab results today 04/19/2020 of CBC w/diff and CMP is as follows: all values are WNL except for ***  On review of systems, pt reports *** and denies *** and any other symptoms.   MEDICAL HISTORY:  Past Medical History:  Diagnosis Date  . Acute blood loss anemia 04/02/2017  . Anxiety   . Gastrointestinal hemorrhage with melena   . Headache   . Hypercholesteremia   . Hypertension   . lung ca dx'd 05/2017  . Persistent atrial fibrillation with rapid ventricular response ([place]) 04/07/2017  . Seizures ([place])    04/2017 while in hospital at [facility]  . Vitamin D  deficiency     SURGICAL HISTORY: Past Surgical History:  Procedure Laterality Date  . COLONOSCOPY Left 04/09/2017   Procedure: COLONOSCOPY;  Surgeon: Carol Ada, MD;  Location: [place];  Service: Endoscopy;  Laterality: Left;  . ESOPHAGOGASTRODUODENOSCOPY N/A 04/03/2017   Procedure: ESOPHAGOGASTRODUODENOSCOPY (EGD);  Surgeon: Carol Ada, MD;  Location: [place];  Service: Endoscopy;  Laterality: N/A;  . ESOPHAGOGASTRODUODENOSCOPY N/A 08/10/2019   Procedure: ESOPHAGOGASTRODUODENOSCOPY (EGD);  Surgeon: Juanita Craver, MD;  Location: [facility];  Service: Endoscopy;  Laterality: N/A;  . ESOPHAGOGASTRODUODENOSCOPY (EGD) WITH PROPOFOL N/A 04/12/2017   Procedure: ESOPHAGOGASTRODUODENOSCOPY (EGD) WITH PROPOFOL;  Surgeon: Milus Banister, MD;  Location: [facility] ENDOSCOPY;  Service: Endoscopy;  Laterality: N/A;  . FOREIGN BODY REMOVAL N/A 08/10/2019   Procedure: FOREIGN BODY REMOVAL;  Surgeon: Juanita Craver, MD;  Location: [facility] ENDOSCOPY;  Service: Endoscopy;  Laterality: N/A;  . IR ANGIOGRAM SELECTIVE EACH ADDITIONAL VESSEL  04/12/2017  . IR ANGIOGRAM SELECTIVE EACH ADDITIONAL VESSEL  04/13/2017  . IR ANGIOGRAM SELECTIVE EACH ADDITIONAL VESSEL  04/13/2017  . IR ANGIOGRAM SELECTIVE EACH ADDITIONAL VESSEL  04/13/2017  . IR ANGIOGRAM VISCERAL SELECTIVE  04/12/2017  . IR ANGIOGRAM VISCERAL SELECTIVE  04/13/2017  . IR EMBO ART  VEN HEMORR LYMPH EXTRAV  INC GUIDE ROADMAPPING  04/12/2017  . IR EMBO ART  VEN  HEMORR LYMPH EXTRAV  INC GUIDE ROADMAPPING  04/13/2017  . IR FLUORO GUIDE CV LINE RIGHT  04/13/2017  . IR FLUORO GUIDE PORT INSERTION RIGHT  07/27/2017  . IR IVC FILTER PLMT / S&I /IMG GUID/MOD SED  04/12/2017  . IR US GUIDE VASC ACCESS RIGHT  04/12/2017  . IR US GUIDE VASC ACCESS RIGHT  04/13/2017  . IR US GUIDE VASC ACCESS RIGHT  04/13/2017  . IR US GUIDE VASC ACCESS RIGHT  07/27/2017  . LEG SURGERY  1974   Blood Clot Removal   . VIDEO BRONCHOSCOPY WITH ENDOBRONCHIAL ULTRASOUND N/A 05/19/2017   Procedure: VIDEO  BRONCHOSCOPY WITH ENDOBRONCHIAL ULTRASOUND;  Surgeon: Grace Isaac, MD;  Location: [place];  Service: Thoracic;  Laterality: N/A;    SOCIAL HISTORY: Social History   Socioeconomic History  . Marital status: Single    Spouse name: Not on file  . Number of children: Not on file  . Years of education: Not on file  . Highest education level: Not on file  Occupational History  . Occupation: Has to lift heavy boxes at times.    Employer: [employer]  Tobacco Use  . Smoking status: Former Smoker    Packs/day: 0.50    Years: 40.00    Pack years: 20.00    Types: Cigarettes    Quit date: 03/31/2017    Years since quitting: 3.0  . Smokeless tobacco: Never Used  Vaping Use  . Vaping Use: Never used  Substance and Sexual Activity  . Alcohol use: No  . Drug use: No  . Sexual activity: Not Currently  Other Topics Concern  . Not on file  Social History Narrative  . Not on file   Social Determinants of Health   Financial Resource Strain: Not on file  Food Insecurity: No Food Insecurity  . Worried About Charity fundraiser in the Last Year: Never true  . Ran Out of Food in the Last Year: Never true  Transportation Needs: No Transportation Needs  . Lack of Transportation (Medical): No  . Lack of Transportation (Non-Medical): No  Physical Activity: Not on file  Stress: Not on file  Social Connections: Not on file  Intimate Partner Violence: Not on file    FAMILY HISTORY: Family History  Problem Relation Age of Onset  . Heart disease Father     ALLERGIES:  has No Known Allergies.  MEDICATIONS:  Current Outpatient Medications  Medication Sig Dispense Refill  . acetaminophen (TYLENOL) 325 MG tablet Take 1-2 tablets (325-650 mg total) by mouth every 4 (four) hours as needed for mild pain.    . cholecalciferol (VITAMIN D) 1000 units tablet Take 1,000 Units by mouth 2 (two) times daily.    [name] levothyroxine (SYNTHROID) 88 MCG tablet TAKE 1 TABLET(88 MCG) BY MOUTH DAILY (Patient  taking differently: Take 88 mcg by mouth daily before breakfast. TAKE 1 TABLET(88 MCG) BY MOUTH DAILY) 30 tablet 0  . lidocaine-prilocaine (EMLA) cream Apply to affected area once 30 g 3  . lisinopril-hydrochlorothiazide (PRINZIDE,ZESTORETIC) 20-12.5 MG tablet Take 1 tablet by mouth daily.    . pantoprazole (PROTONIX) 40 MG tablet Take 1 tablet (40 mg total) by mouth daily. 30 tablet 3  . triamcinolone cream (KENALOG) 0.1 % Apply 1 application topically 2 (two) times daily.      No current facility-administered medications for this visit.   Facility-Administered Medications Ordered in Other Visits  Medication Dose Route Frequency Provider Last Rate Last Admin  . sodium chloride flush (NS)  0.9 % injection 10 mL  10 mL Intracatheter PRN Brunetta Genera, MD   10 mL at 08/10/18 1558    REVIEW OF SYSTEMS:   10 Point review of Systems was done is negative except as noted above.  PHYSICAL EXAMINATION: ECOG FS:1 - Symptomatic but completely ambulatory  There were no vitals filed for this visit. Wt Readings from Last 3 Encounters:  03/28/20 160 lb 12.8 oz (72.9 kg)  03/21/20 155 lb (70.3 kg)  12/15/19 159 lb (72.1 kg)   There is no height or weight on file to calculate BMI.    *** GENERAL:alert, in no acute distress and comfortable SKIN: no acute rashes, no significant lesions EYES: conjunctiva are pink and non-injected, sclera anicteric OROPHARYNX: MMM, no exudates, no oropharyngeal erythema or ulceration NECK: supple, no JVD LYMPH:  no palpable lymphadenopathy in the cervical, axillary or inguinal regions LUNGS: clear to auscultation b/l with normal respiratory effort HEART: regular rate & rhythm ABDOMEN:  normoactive bowel sounds , non tender, not distended. Extremity: no pedal edema PSYCH: alert & oriented x 3 with fluent speech NEURO: no focal motor/sensory deficits  LABORATORY DATA:  I have reviewed the data as listed  . CBC Latest Ref Rng & Units 12/15/2019 07/27/2019  05/11/2019  WBC 4.0 - 10.5 K/uL 5.8 9.0 6.4  Hemoglobin 12.0 - 15.0 g/dL 10.4(L) 9.8(L) 11.2(L)  Hematocrit 36.0 - 46.0 % 32.0(L) 30.3(L) 34.5(L)  Platelets 150 - 400 K/uL 246 282 292    CMP Latest Ref Rng & Units 12/15/2019 07/27/2019 05/11/2019  Glucose 70 - 99 mg/dL 85 98 84  BUN 8 - 23 mg/dL 21 20 25(H)  Creatinine 0.44 - 1.00 mg/dL 0.85 0.90 1.25(H)  Sodium 135 - 145 mmol/L 140 141 145  Potassium 3.5 - 5.1 mmol/L 3.4(L) 3.8 4.3  Chloride 98 - 111 mmol/L 106 105 107  CO2 22 - 32 mmol/L _0 Calcium 8.9 - 10.3 mg/dL 9.7 9.2 10.1  Total Protein 6.5 - 8.1 g/dL 7.1 6.7 7.1  Total Bilirubin 0.3 - 1.2 mg/dL 0.3 0.2(L) 0.2(L)  Alkaline Phos 38 - 126 U/L 73 69 64  AST 15 - 41 U/L _1 ALT 0 - 44 U/L _2 01/26/2019 CT Abdomen Pelvis W Contrast (Accession [id number])    01/26/2019 CT Chest W Contrast (Accession [id number])   05/19/17 Fine Needle Aspiration:  05/19/17 Bronchial Washing Specimen B:   RADIOGRAPHIC STUDIES: I have personally reviewed the radiological images as listed and agreed with the findings in the report. No results found. MRI brain 04/21/2017: IMPRESSION: 1. Motion degraded examination. 2. 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 3. Otherwise negative MRI of the head with and without contrast for age.  Electronically Signed   By: Elon Alas M.D.   On: 04/22/2017 00:34    ASSESSMENT & PLAN:   65 y.o. female with  1.  Non-Small Cell Carcinoma atleast Stage IIIB  Not enough tissue for further characterization or mutation testing. Presenting with mediastinal nodal mass - TxN2 Mx (Atleast Stage III disease) ? Lingular primary vs inflammation. No overt evidence of metastatic disease.  -EGFR mutation studies on blood negative. Patient is s/p 3 cycles of Carboplatin/taxol -resolution left upper lobe ground-glass opacity has resolved in the interval. 08/19/17 PET revealed  No  significant change in size of hypermetabolic AP window mass. The degree of FDG uptake is slightly increased in the interval. 2. Persistent hypermetabolic focus involving the distal aspect of  the tongue within SUV max of 16.46. Similar to previous exam. (no clinical co-relate). Lung involvement has decreased.   08/19/17 Brain MRI revealed No evidence of intracranial metastases. Interval resolution of bilateral parietal signal abnormality favoring PRES  Completed concurrent Carbo/taxol chemotherapy + RT  12/24/17 PET/CT revealed Marked interval response to therapy with large AP window lymph nodes seen on the previous study almost imperceptible on today's exam. Hypermetabolism within this lesion has resolved with FDG accumulation now at background blood pool levels. 2. Small focus of hypermetabolism in the anal region, similar to prior and indeterminate by PET imaging. 3. Hypermetabolic focus at the distal tongue has decreased in the Interval. 4. Marked pelvic floor laxity with cystocele. 5. Stable tiny left lung nodules. 6.  Aortic Atherosclerois. 7.  Emphysema.   04/03/17 endoscopy did not reveal esophageal thickening    04/27/18 CT Chest revealed Stable exam. No new or progressive findings. 2. Stable mild circumferential wall thickening mid esophagus. Esophagitis is a consideration. 3. Stable tiny left pulmonary nodules. 4.  Emphysema. 5.  Aortic Atherosclerois.  01/26/2019 CT Abdomen Pelvis W Contrast (Accession [id number]) revealed "1. Patchy areas of consolidation, bronchiectasis and ground-glass in the medial left hemithorax, presumably treatment related. No evidence of metastatic disease.2. Extensive debris in the mainstem bronchi, bronchus intermedius and left upper lobe bronchus."    07/27/2019 CT Chest ([id number]) revealed "Nodularity along the pleural surface in the posterior left chest."  2. Bone Pain from neulasta - managed with Tylenol  3. Previous h/ot SZ -currently on Keppra. MRI brain -  resolution of findings of PRES  4. H/o rt calf DVT - off anticoagulation due to massive GI bleeding. S/p IVC filter. No issues currently  5. S/p previous GI bleeding from Gastric ulcer --needing clipping and arterial embolization. On PPI BID Ulcers seen to be healed with recent April 2020 repeat endoscopy  6. H/o ctx /steroid related thrush --now resolved.  8. Elevated TSH/Hypothyroidism -- ? Related to immunotherapy 07/13/18 Thyroid functions revealed TSH at 62.91 and Free T4 at 0.30 Began low dose 68mg Levothyroxine and provide endocrinology referral for further optimization. Recommend taking Levothyroxine 1.5 hours before to breakfast with only water and no other medications.  F/u with Dr. SKelton Pillarin Endocrinology   PLAN: -Discussed pt *** -No lab, clinical, or radiographic evidence of Non-Small Cell Carcinoma recurrence at this time. -Will see back ***   FOLLOW UP: ***    The total time spent in the appointment was *** minutes and more than 50% was on counseling and direct patient cares.  All of the patient's questions were answered with apparent satisfaction. The patient knows to call the clinic with any problems, questions or concerns.   GSullivan LoneMD MPirtlevilleAAHIVMS [facility] LLCCJohn D Archbold Memorial HospitalHematology/Oncology Physician CDegraff Memorial Hospital (Office):       [phone](Work cell):  [phone](Fax):           [phone] 04/18/2020 10:33 AM  I, RReinaldo Raddle am acting as scribe for Dr. GSullivan Lone MD.

## 2020-04-19 ENCOUNTER — Inpatient Hospital Stay: Payer: PPO

## 2020-04-19 ENCOUNTER — Inpatient Hospital Stay: Payer: PPO | Admitting: Hematology

## 2020-04-19 ENCOUNTER — Telehealth: Payer: Self-pay | Admitting: *Deleted

## 2020-04-19 ENCOUNTER — Telehealth: Payer: Self-pay | Admitting: Hematology

## 2020-04-19 NOTE — Telephone Encounter (Signed)
Per Dr. Irene Limbo - contact patient and cancel appts for today and reschedule once CT /C/A/P completed. Contacted patient with this information  - she said no one from central radiology scheduling had contacted her about the CT ordered in October. Advised her that once CT scheduled, will schedule lab and port flush on same day and schedule to see Dr. Irene Limbo following week.  Gave her # to contact [facility] Scheduling.  Schedule message sent

## 2020-04-19 NOTE — Telephone Encounter (Signed)
Patient called to schedule lab/flush and MD visit around CT scan. Scheduled appointments accordingly. Patient is aware of appointments date and times.

## 2020-04-26 ENCOUNTER — Inpatient Hospital Stay: Payer: PPO

## 2020-04-26 ENCOUNTER — Other Ambulatory Visit: Payer: Self-pay

## 2020-04-26 ENCOUNTER — Inpatient Hospital Stay: Payer: PPO | Attending: Hematology

## 2020-04-26 ENCOUNTER — Ambulatory Visit (HOSPITAL_COMMUNITY)
Admission: RE | Admit: 2020-04-26 | Discharge: 2020-04-26 | Disposition: A | Discharge status: Home / Self Care | Payer: PPO | Source: Ambulatory Visit | Attending: Hematology | Admitting: Hematology

## 2020-04-26 DIAGNOSIS — E039 Hypothyroidism, unspecified: Secondary | ICD-10-CM | POA: Diagnosis not present

## 2020-04-26 DIAGNOSIS — Z86718 Personal history of other venous thrombosis and embolism: Secondary | ICD-10-CM | POA: Diagnosis not present

## 2020-04-26 DIAGNOSIS — Z87891 Personal history of nicotine dependence: Secondary | ICD-10-CM | POA: Diagnosis not present

## 2020-04-26 DIAGNOSIS — J984 Other disorders of lung: Secondary | ICD-10-CM | POA: Diagnosis not present

## 2020-04-26 DIAGNOSIS — J438 Other emphysema: Secondary | ICD-10-CM | POA: Diagnosis not present

## 2020-04-26 DIAGNOSIS — Z85118 Personal history of other malignant neoplasm of bronchus and lung: Secondary | ICD-10-CM | POA: Insufficient documentation

## 2020-04-26 DIAGNOSIS — Z95828 Presence of other vascular implants and grafts: Secondary | ICD-10-CM

## 2020-04-26 DIAGNOSIS — K8080 Other cholelithiasis without obstruction: Secondary | ICD-10-CM | POA: Diagnosis not present

## 2020-04-26 DIAGNOSIS — I2510 Atherosclerotic heart disease of native coronary artery without angina pectoris: Secondary | ICD-10-CM | POA: Diagnosis not present

## 2020-04-26 DIAGNOSIS — C3490 Malignant neoplasm of unspecified part of unspecified bronchus or lung: Secondary | ICD-10-CM | POA: Diagnosis not present

## 2020-04-26 DIAGNOSIS — M898X9 Other specified disorders of bone, unspecified site: Secondary | ICD-10-CM | POA: Insufficient documentation

## 2020-04-26 LAB — CBC WITH DIFFERENTIAL/PLATELET
Abs Immature Granulocytes: 0.02 10*3/uL (ref 0.00–0.07)
Basophils Absolute: 0.1 10*3/uL (ref 0.0–0.1)
Basophils Relative: 1 %
Eosinophils Absolute: 0.2 10*3/uL (ref 0.0–0.5)
Eosinophils Relative: 3 %
HCT: 33 % — ABNORMAL LOW (ref 36.0–46.0)
Hemoglobin: 10.7 g/dL — ABNORMAL LOW (ref 12.0–15.0)
Immature Granulocytes: 0 %
Lymphocytes Relative: 23 %
Lymphs Abs: 1.6 10*3/uL (ref 0.7–4.0)
MCH: 28.9 pg (ref 26.0–34.0)
MCHC: 32.4 g/dL (ref 30.0–36.0)
MCV: 89.2 fL (ref 80.0–100.0)
Monocytes Absolute: 0.6 10*3/uL (ref 0.1–1.0)
Monocytes Relative: 9 %
Neutro Abs: 4.2 10*3/uL (ref 1.7–7.7)
Neutrophils Relative %: 64 %
Platelets: 260 10*3/uL (ref 150–400)
RBC: 3.7 MIL/uL — ABNORMAL LOW (ref 3.87–5.11)
RDW: 17 % — ABNORMAL HIGH (ref 11.5–15.5)
WBC: 6.6 10*3/uL (ref 4.0–10.5)
nRBC: 0 % (ref 0.0–0.2)

## 2020-04-26 LAB — CMP (CANCER CENTER ONLY)
ALT: 9 U/L (ref 0–44)
AST: 17 U/L (ref 15–41)
Albumin: 3.7 g/dL (ref 3.5–5.0)
Alkaline Phosphatase: 76 U/L (ref 38–126)
Anion gap: 7 (ref 5–15)
BUN: 30 mg/dL — ABNORMAL HIGH (ref 8–23)
CO2: 24 mmol/L (ref 22–32)
Calcium: 8.9 mg/dL (ref 8.9–10.3)
Chloride: 106 mmol/L (ref 98–111)
Creatinine: 1.03 mg/dL — ABNORMAL HIGH (ref 0.44–1.00)
GFR, Estimated: 60 mL/min — ABNORMAL LOW (ref >60)
Glucose, Bld: 112 mg/dL — ABNORMAL HIGH (ref 70–99)
Potassium: 3.9 mmol/L (ref 3.5–5.1)
Sodium: 137 mmol/L (ref 135–145)
Total Bilirubin: 0.3 mg/dL (ref 0.3–1.2)
Total Protein: 7.1 g/dL (ref 6.5–8.1)

## 2020-04-26 MED ORDER — SODIUM CHLORIDE 0.9% FLUSH
10.0000 mL | INTRAVENOUS | Status: DC | PRN
  Administered 2020-04-26: 10 mL
  Filled 2020-04-26: qty 10

## 2020-04-26 MED ORDER — HEPARIN SOD (PORK) LOCK FLUSH 100 UNIT/ML IV SOLN
INTRAVENOUS | Status: AC
  Filled 2020-04-26: qty 5

## 2020-04-26 NOTE — Patient Instructions (Signed)
Implanted Port Insertion, Care After This sheet gives you information about how to care for yourself after your procedure. Your health care provider may also give you more specific instructions. If you have problems or questions, contact your health care provider. What can I expect after the procedure? After the procedure, it is common to have:  Discomfort at the port insertion site.  Bruising on the skin over the port. This should improve over 3-4 days. Follow these instructions at home: Port care  After your port is placed, you will get a manufacturer's information card. The card has information about your port. Keep this card with you at all times.  Take care of the port as told by your health care provider. Ask your health care provider if you or a family member can get training for taking care of the port at home. A home health care nurse may also take care of the port.  Make sure to remember what type of port you have. Incision care  Follow instructions from your health care provider about how to take care of your port insertion site. Make sure you: ? Wash your hands with soap and water before and after you change your bandage (dressing). If soap and water are not available, use hand sanitizer. ? Change your dressing as told by your health care provider. ? Leave stitches (sutures), skin glue, or adhesive strips in place. These skin closures may need to stay in place for 2 weeks or longer. If adhesive strip edges start to loosen and curl up, you may trim the loose edges. Do not remove adhesive strips completely unless your health care provider tells you to do that.  Check your port insertion site every day for signs of infection. Check for: ? Redness, swelling, or pain. ? Fluid or blood. ? Warmth. ? Pus or a bad smell.      Activity  Return to your normal activities as told by your health care provider. Ask your health care provider what activities are safe for you.  Do not  lift anything that is heavier than 10 lb (4.5 kg), or the limit that you are told, until your health care provider says that it is safe. General instructions  Take over-the-counter and prescription medicines only as told by your health care provider.  Do not take baths, swim, or use a hot tub until your health care provider approves. Ask your health care provider if you may take showers. You may only be allowed to take sponge baths.  Do not drive for 24 hours if you were given a sedative during your procedure.  Wear a medical alert bracelet in case of an emergency. This will tell any health care providers that you have a port.  Keep all follow-up visits as told by your health care provider. This is important. Contact a health care provider if:  You cannot flush your port with saline as directed, or you cannot draw blood from the port.  You have a fever or chills.  You have redness, swelling, or pain around your port insertion site.  You have fluid or blood coming from your port insertion site.  Your port insertion site feels warm to the touch.  You have pus or a bad smell coming from the port insertion site. Get help right away if:  You have chest pain or shortness of breath.  You have bleeding from your port that you cannot control. Summary  Take care of the port as told by your   health care provider. Keep the manufacturer's information card with you at all times.  Change your dressing as told by your health care provider.  Contact a health care provider if you have a fever or chills or if you have redness, swelling, or pain around your port insertion site.  Keep all follow-up visits as told by your health care provider. This information is not intended to replace advice given to you by your health care provider. Make sure you discuss any questions you have with your health care provider. Document Revised: 09/22/2017 Document Reviewed: 09/22/2017 Elsevier Patient Education   2021 [employer]  

## 2020-04-29 NOTE — Progress Notes (Signed)
[name]    HEMATOLOGY/ONCOLOGY CLINIC NOTE  Date of Service: 04/29/2020   Patient Care Team: [name], MD as PCP - General (Family Medicine)  CHIEF COMPLAINTS/PURPOSE OF CONSULTATION:   F/u for continued management of Non-Small Cell lung Carcinoma  HISTORY OF PRESENTING ILLNESS:   Kayla Wyatt is a wonderful 65 y.o. female who has been referred to [place] by Dr [name] for evaluation and management of Non-Small Cell Carcinoma. She is accompanied today by her husband. The pt reports that she is doing well overall.   Patient recently had a significant hospitalization on 04/02/2017 - as per DC summary by [name] " with abdominal pain, hematemesis, melena and ABLA due to GIB.  She underwent clipping of gastric ulcer and received multiple units PRBC but continued to have drop in H/H with bloody stool. NM GI scan was negative for bleeding and she underwent colonoscopy by Dr. [name] 1/31 showing diverticulosis but no signs of bleeding. Hospital course significant for R- gastrocnemius DVT, PAF, incidental findings of mediastinal mass as well as brief episode of hematuria. She was cleared to start on Xarelto on 02/01 for treatment of DVT. On 2/2 am she developed hematemesis with maroon stools and hypotension requiring fluid bolus as well as 2 units PRBCs. She continued to decline requiring intubation, pressors as well as reversal of anticoagulation with Kcentra on 02/3. She underwent UGI revealing large clot with fresh blood in stomach and underwent mesenteric arteriogram with percutaneous coil embolization fo distal tributary of left gastric artery and placement of IVC filter by interventional radiology.   A fib with RVR felt to be due to hemorrhagic shock and she converted to NSR. [name] felt no further cardiac work up indicated and did not recommend initiating anticoagulation in short term. To follow up with [name] after PET scan and routine biopsy after medically stable. She  continued to have bleeding and underwent visceral angiogram with embolization of inferior division of splenic artery and associated gastric arteries on 2/4. No surgical intervention needed per [name] with recommendations to continue monitoring H/H as well as for recurrence of hematochezia. She tolerated extubation on 2/6 and respiratory status improving.  On 2/11, she has episode of unresponsiveness with jerking of bilateral limbs that lasted about 3 minutes. She had amnesia of events but was back to baseline. EEG revealed "focal slowing over the right temporo-occipital regionandoccasional epileptiform discharges over the right occipital region". Head CT reviewed, unremarkable for acute intracranial process. MRI brain done revealing mild biparietal signal abnormality suspicious for PRES. [name] recommended starting patient on Keppra BID with repeat MRI in 2 weeks. New onset seizure likely provoked by PRES--if resolved--Ok to take patient off Keppra. Patient with resultant generalized weakness. CIR recommended due to functional deficits. "  Later as outpatient she had a PET scan on 05/14/17 which showed a hypermetabolic mediastinal mass which was subsequently biopsied with a bronchoscopy on 05/19/17. She notes that she had smoked for about 35-40 years, half a pack of a day. She reports that she has not had any other symptoms related to her smoking; she has never had to be on home oxygen or inhalers. She has recently begun Symbicort. While being in the hospital she lost about 20 lbs but has maintained her weight since being discharged.  The pt and her husband note that she is concerned about what to do if she is not able to go back to work after her 90-day leave. We recommended that they speak with  our social workers here to explore the available options for insurance coverage /disability application etc.  Of note prior to the patient's visit today, pt has had PET/CT completed on 05/14/17 with  results revealing 1. The known mediastinal mass is intensely hypermetabolic, worrisome for malignancy. This could reflect lymphoma or small cell lung cancer. Tissue sampling recommended.2. No peripheral lung mass identified. New patchy ground-glass opacity in the lingula is mildly hypermetabolic and likely inflammatory.  On 05/19/17 the pt had a Fine Needle Aspiration which revealed Non-Small cell lung carcinoma.  Most recent lab results (05/19/17) of CBC  is as follows: all values are WNL except for RBC at 2.90, Hgb at 8.6, HCT at 28.0, RDW at 16.4.  On review of systems, pt reports knee pain, and denies abdominal pains, leg swelling, SOB, CP, and any other symptoms.   On PMHx the pt reports acute blood loss anemia, GI hemorrhage with melena, hypercholesteremia, HTN, persistent atrial fibrillation with rapid ventricular response, Vitamin D deficiency, denies liver and kidney problems. On Social Hx the pt reports having smoked for 35-40 years of half a pack per day. She quit smoking on 03/31/17.  On Family Hx the pt reports that her father had heart disease.  Interval History:   Kayla Wyatt returns today regarding her Non-Small Cell Carcinoma. The patient's last visit with [place] was on 12/15/2019. The pt reports that she is doing well overall.  The pt reports no new symptoms. No chest pain or shortness of breath  Of note since the patient's last visit, pt has had CT Chest wo contrast ([id number]) on 04/26/2020, which revealed "1. Radiation changes in the left hilum and along the left paramediastinal lung with associated loss of volume. 2. When compared to the prior chest CT and PET-CT there does appear to be increasing soft tissue density particularly in the left hilar region. It is possible this is progressive radiation fibrosis but could not exclude the possibility of recurrent tumor. Recommend follow-up PET-CT for further evaluation. 3. No obvious mediastinal or hilar lymphadenopathy. 4.  Advanced emphysematous changes and pulmonary scarring. 5. Stable age advanced three-vessel coronary artery calcifications.6. Cholelithiasis. 7. Emphysema and aortic atherosclerosis."  Lab results today 04/30/2020 of CBC w/diff and CMP - reviewed  On review of systems, pt reports no other symptoms.   MEDICAL HISTORY:  Past Medical History:  Diagnosis Date  . Acute blood loss anemia 04/02/2017  . Anxiety   . Gastrointestinal hemorrhage with melena   . Headache   . Hypercholesteremia   . Hypertension   . lung ca dx'd 05/2017  . Persistent atrial fibrillation with rapid ventricular response ([place]) 04/07/2017  . Seizures ([place])    04/2017 while in hospital at [facility]  . Vitamin D deficiency     SURGICAL HISTORY: Past Surgical History:  Procedure Laterality Date  . COLONOSCOPY Left 04/09/2017   Procedure: COLONOSCOPY;  Surgeon: Carol Ada, MD;  Location: [place];  Service: Endoscopy;  Laterality: Left;  . ESOPHAGOGASTRODUODENOSCOPY N/A 04/03/2017   Procedure: ESOPHAGOGASTRODUODENOSCOPY (EGD);  Surgeon: Carol Ada, MD;  Location: [place];  Service: Endoscopy;  Laterality: N/A;  . ESOPHAGOGASTRODUODENOSCOPY N/A 08/10/2019   Procedure: ESOPHAGOGASTRODUODENOSCOPY (EGD);  Surgeon: Juanita Craver, MD;  Location: [facility];  Service: Endoscopy;  Laterality: N/A;  . ESOPHAGOGASTRODUODENOSCOPY (EGD) WITH PROPOFOL N/A 04/12/2017   Procedure: ESOPHAGOGASTRODUODENOSCOPY (EGD) WITH PROPOFOL;  Surgeon: Milus Banister, MD;  Location: [facility];  Service: Endoscopy;  Laterality: N/A;  . FOREIGN BODY REMOVAL N/A 08/10/2019   Procedure: FOREIGN BODY  REMOVAL;  Surgeon: Juanita Craver, MD;  Location: [facility];  Service: Endoscopy;  Laterality: N/A;  . IR ANGIOGRAM SELECTIVE EACH ADDITIONAL VESSEL  04/12/2017  . IR ANGIOGRAM SELECTIVE EACH ADDITIONAL VESSEL  04/13/2017  . IR ANGIOGRAM SELECTIVE EACH ADDITIONAL VESSEL  04/13/2017  . IR ANGIOGRAM SELECTIVE EACH ADDITIONAL VESSEL  04/13/2017  . IR  ANGIOGRAM VISCERAL SELECTIVE  04/12/2017  . IR ANGIOGRAM VISCERAL SELECTIVE  04/13/2017  . IR EMBO ART  VEN HEMORR LYMPH EXTRAV  INC GUIDE ROADMAPPING  04/12/2017  . IR EMBO ART  VEN HEMORR LYMPH EXTRAV  INC GUIDE ROADMAPPING  04/13/2017  . IR FLUORO GUIDE CV LINE RIGHT  04/13/2017  . IR FLUORO GUIDE PORT INSERTION RIGHT  07/27/2017  . IR IVC FILTER PLMT / S&I /IMG GUID/MOD SED  04/12/2017  . IR US GUIDE VASC ACCESS RIGHT  04/12/2017  . IR US GUIDE VASC ACCESS RIGHT  04/13/2017  . IR US GUIDE VASC ACCESS RIGHT  04/13/2017  . IR US GUIDE VASC ACCESS RIGHT  07/27/2017  . LEG SURGERY  1974   Blood Clot Removal   . VIDEO BRONCHOSCOPY WITH ENDOBRONCHIAL ULTRASOUND N/A 05/19/2017   Procedure: VIDEO BRONCHOSCOPY WITH ENDOBRONCHIAL ULTRASOUND;  Surgeon: Grace Isaac, MD;  Location: [place];  Service: Thoracic;  Laterality: N/A;    SOCIAL HISTORY: Social History   Socioeconomic History  . Marital status: Single    Spouse name: Not on file  . Number of children: Not on file  . Years of education: Not on file  . Highest education level: Not on file  Occupational History  . Occupation: Has to lift heavy boxes at times.    Employer: [employer]  Tobacco Use  . Smoking status: Former Smoker    Packs/day: 0.50    Years: 40.00    Pack years: 20.00    Types: Cigarettes    Quit date: 03/31/2017    Years since quitting: 3.0  . Smokeless tobacco: Never Used  Vaping Use  . Vaping Use: Never used  Substance and Sexual Activity  . Alcohol use: No  . Drug use: No  . Sexual activity: Not Currently  Other Topics Concern  . Not on file  Social History Narrative  . Not on file   Social Determinants of Health   Financial Resource Strain: Not on file  Food Insecurity: No Food Insecurity  . Worried About Charity fundraiser in the Last Year: Never true  . Ran Out of Food in the Last Year: Never true  Transportation Needs: No Transportation Needs  . Lack of Transportation (Medical): No  . Lack of Transportation  (Non-Medical): No  Physical Activity: Not on file  Stress: Not on file  Social Connections: Not on file  Intimate Partner Violence: Not on file    FAMILY HISTORY: Family History  Problem Relation Age of Onset  . Heart disease Father     ALLERGIES:  has No Known Allergies.  MEDICATIONS:  Current Outpatient Medications  Medication Sig Dispense Refill  . acetaminophen (TYLENOL) 325 MG tablet Take 1-2 tablets (325-650 mg total) by mouth every 4 (four) hours as needed for mild pain.    . cholecalciferol (VITAMIN D) 1000 units tablet Take 1,000 Units by mouth 2 (two) times daily.    [name] levothyroxine (SYNTHROID) 88 MCG tablet TAKE 1 TABLET(88 MCG) BY MOUTH DAILY (Patient taking differently: Take 88 mcg by mouth daily before breakfast. TAKE 1 TABLET(88 MCG) BY MOUTH DAILY) 30 tablet 0  . lidocaine-prilocaine (  EMLA) cream Apply to affected area once 30 g 3  . lisinopril-hydrochlorothiazide (PRINZIDE,ZESTORETIC) 20-12.5 MG tablet Take 1 tablet by mouth daily.    . pantoprazole (PROTONIX) 40 MG tablet Take 1 tablet (40 mg total) by mouth daily. 30 tablet 3  . triamcinolone cream (KENALOG) 0.1 % Apply 1 application topically 2 (two) times daily.      No current facility-administered medications for this visit.   Facility-Administered Medications Ordered in Other Visits  Medication Dose Route Frequency Provider Last Rate Last Admin  . sodium chloride flush (NS) 0.9 % injection 10 mL  10 mL Intracatheter PRN Brunetta Genera, MD   10 mL at 08/10/18 1558    REVIEW OF SYSTEMS:   10 Point review of Systems was done is negative except as noted above.  PHYSICAL EXAMINATION: ECOG FS:1 - Symptomatic but completely ambulatory  There were no vitals filed for this visit. Wt Readings from Last 3 Encounters:  03/28/20 160 lb 12.8 oz (72.9 kg)  03/21/20 155 lb (70.3 kg)  12/15/19 159 lb (72.1 kg)   Body mass index is 29.14 kg/m.    NAD GENERAL:alert, in no acute distress and  comfortable SKIN: no acute rashes, no significant lesions EYES: conjunctiva are pink and non-injected, sclera anicteric OROPHARYNX: MMM, no exudates, no oropharyngeal erythema or ulceration NECK: supple, no JVD LYMPH:  no palpable lymphadenopathy in the cervical, axillary or inguinal regions LUNGS: clear to auscultation b/l with normal respiratory effort HEART: regular rate & rhythm ABDOMEN:  normoactive bowel sounds , non tender, not distended. Extremity: no pedal edema PSYCH: alert & oriented x 3 with fluent speech NEURO: no focal motor/sensory deficits   LABORATORY DATA:  I have reviewed the data as listed  . CBC Latest Ref Rng & Units 04/26/2020 12/15/2019 07/27/2019  WBC 4.0 - 10.5 K/uL 6.6 5.8 9.0  Hemoglobin 12.0 - 15.0 g/dL 10.7(L) 10.4(L) 9.8(L)  Hematocrit 36.0 - 46.0 % 33.0(L) 32.0(L) 30.3(L)  Platelets 150 - 400 K/uL 260 246 282    CMP Latest Ref Rng & Units 04/26/2020 12/15/2019 07/27/2019  Glucose 70 - 99 mg/dL 112(H) 85 98  BUN 8 - 23 mg/dL 30(H) 21 20  Creatinine 0.44 - 1.00 mg/dL 1.03(H) 0.85 0.90  Sodium 135 - 145 mmol/L 137 140 141  Potassium 3.5 - 5.1 mmol/L 3.9 3.4(L) 3.8  Chloride 98 - 111 mmol/L 106 106 105  CO2 22 - 32 mmol/L _0 Calcium 8.9 - 10.3 mg/dL 8.9 9.7 9.2  Total Protein 6.5 - 8.1 g/dL 7.1 7.1 6.7  Total Bilirubin 0.3 - 1.2 mg/dL 0.3 0.3 0.2(L)  Alkaline Phos 38 - 126 U/L 76 73 69  AST 15 - 41 U/L _1 ALT 0 - 44 U/L _2 01/26/2019 CT Abdomen Pelvis W Contrast (Accession [id number])    01/26/2019 CT Chest W Contrast (Accession [id number])   05/19/17 Fine Needle Aspiration:  05/19/17 Bronchial Washing Specimen B:   RADIOGRAPHIC STUDIES: I have personally reviewed the radiological images as listed and agreed with the findings in the report. CT Chest Wo Contrast  Result Date: 04/27/2020 CLINICAL DATA:  Non-small cell lung cancer restaging. EXAM: CT CHEST WITHOUT CONTRAST TECHNIQUE: Multidetector CT imaging of the chest was  performed following the standard protocol without IV contrast. COMPARISON:  PET-CT 08/17/2019 and chest CT 07/27/2019 FINDINGS: Cardiovascular: The heart is normal in size. No pericardial effusion. The aorta is normal in caliber. Stable atherosclerotic calcifications. Stable significant age advanced three-vessel coronary artery  calcifications. The right IJ Port-A-Cath is in good position without complicating features. Mediastinum/Nodes: No obvious mediastinal or hilar lymphadenopathy. The esophagus is grossly normal. Lungs/Pleura: Advanced emphysematous changes again noted with areas of pulmonary scarring. Radiation changes are noted in the left hilum and along the left paramediastinal lung with associated loss of volume. When compared to the prior chest CT and PET-CT there does appear to be increasing soft tissue density particularly in the left hilar region. It is possible this is progressive radiation fibrosis but could not exclude the possibility of recurrent tumor. Recommend follow-up PET-CT for further evaluation. No new pulmonary nodules to suggest pulmonary metastatic disease. No acute pulmonary findings. Upper Abdomen: No significant upper abdominal findings. No obvious hepatic or adrenal gland lesions. Stable cholelithiasis. Musculoskeletal: No breast masses, supraclavicular or axillary adenopathy the. The bony thorax is intact. No findings suspicious for osseous metastatic disease. IMPRESSION: 1. Radiation changes in the left hilum and along the left paramediastinal lung with associated loss of volume. 2. When compared to the prior chest CT and PET-CT there does appear to be increasing soft tissue density particularly in the left hilar region. It is possible this is progressive radiation fibrosis but could not exclude the possibility of recurrent tumor. Recommend follow-up PET-CT for further evaluation. 3. No obvious mediastinal or hilar lymphadenopathy. 4. Advanced emphysematous changes and pulmonary  scarring. 5. Stable age advanced three-vessel coronary artery calcifications. 6. Cholelithiasis. 7. Emphysema and aortic atherosclerosis. Aortic Atherosclerosis (ICD10-I70.0) and Emphysema (ICD10-J43.9). Electronically Signed   By: Marijo Sanes M.D.   On: 04/27/2020 09:10   MRI brain 04/21/2017: IMPRESSION: 1. Motion degraded examination. 2. 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 3. Otherwise negative MRI of the head with and without contrast for age.  Electronically Signed   By: Elon Alas M.D.   On: 04/22/2017 00:34    ASSESSMENT & PLAN:   65 y.o. female with  1.  Non-Small Cell Carcinoma atleast Stage IIIB  Not enough tissue for further characterization or mutation testing. Presenting with mediastinal nodal mass - TxN2 Mx (Atleast Stage III disease) ? Lingular primary vs inflammation. No overt evidence of metastatic disease.  -EGFR mutation studies on blood negative. Patient is s/p 3 cycles of Carboplatin/taxol -resolution left upper lobe ground-glass opacity has resolved in the interval. 08/19/17 PET revealed  No significant change in size of hypermetabolic AP window mass. The degree of FDG uptake is slightly increased in the interval. 2. Persistent hypermetabolic focus involving the distal aspect of the tongue within SUV max of 16.46. Similar to previous exam. (no clinical co-relate). Lung involvement has decreased.   08/19/17 Brain MRI revealed No evidence of intracranial metastases. Interval resolution of bilateral parietal signal abnormality favoring PRES  Completed concurrent Carbo/taxol chemotherapy + RT  12/24/17 PET/CT revealed Marked interval response to therapy with large AP window lymph nodes seen on the previous study almost imperceptible on today's exam. Hypermetabolism within this lesion has resolved with FDG accumulation now at background blood pool levels. 2. Small focus of hypermetabolism in the anal  region, similar to prior and indeterminate by PET imaging. 3. Hypermetabolic focus at the distal tongue has decreased in the Interval. 4. Marked pelvic floor laxity with cystocele. 5. Stable tiny left lung nodules. 6.  Aortic Atherosclerois. 7.  Emphysema.   04/03/17 endoscopy did not reveal esophageal thickening    04/27/18 CT Chest revealed Stable exam. No new or progressive findings. 2. Stable mild circumferential wall thickening mid esophagus. Esophagitis is a consideration.  3. Stable tiny left pulmonary nodules. 4.  Emphysema. 5.  Aortic Atherosclerois.  01/26/2019 CT Abdomen Pelvis W Contrast (Accession [id number]) revealed "1. Patchy areas of consolidation, bronchiectasis and ground-glass in the medial left hemithorax, presumably treatment related. No evidence of metastatic disease.2. Extensive debris in the mainstem bronchi, bronchus intermedius and left upper lobe bronchus."    07/27/2019 CT Chest ([id number]) revealed "Nodularity along the pleural surface in the posterior left chest."  2. Bone Pain from neulasta - managed with Tylenol  3. Previous h/ot SZ -currently on Keppra. MRI brain - resolution of findings of PRES  4. H/o rt calf DVT - off anticoagulation due to massive GI bleeding. S/p IVC filter. No issues currently  5. S/p previous GI bleeding from Gastric ulcer --needing clipping and arterial embolization. On PPI BID Ulcers seen to be healed with recent April 2020 repeat endoscopy  6. H/o ctx /steroid related thrush --now resolved.  8. Elevated TSH/Hypothyroidism -- ? Related to immunotherapy 07/13/18 Thyroid functions revealed TSH at 62.91 and Free T4 at 0.30 Began low dose 88mg Levothyroxine and provide endocrinology referral for further optimization. Recommend taking Levothyroxine 1.5 hours before to breakfast with only water and no other medications.  F/u with Dr. SKelton Pillarin Endocrinology   PLAN: -Discussed pt labwork today, 04/30/2020; stable -Discussed pts CT  chest showed changed in the left hilar region ? Progressive radiation fibrosis vs early tumor. -PET/CT suggested to patient patient -- she declined this option. -agreeable to rpt CT chest in 11 weeks --No lab, clinical, evidence of Non-Small Cell Carcinoma recurrence at this time. -Will see back in 12 weeks with rpt CT chest   FOLLOW UP: CT chest wo contrast in 11 weeks RTC with Dr KIrene Limbowith portflush and labs in 12 weeks    The total time spent in the appointment was 30 minutes and more than 50% was on counseling and direct patient cares.  All of the patient's questions were answered with apparent satisfaction. The patient knows to call the clinic with any problems, questions or concerns.   GSullivan LoneMD MJacksonvilleAAHIVMS SShriners Hospital For ChildrenCAndroscoggin Valley HospitalHematology/Oncology Physician CRe[facility] (Office):       [phone](Work cell):  [phone](Fax):           [phone] 04/29/2020 9:39 AM  I, RReinaldo Raddle am acting as scribe for Dr. GSullivan Lone MD.      .I have reviewed the above documentation for accuracy and completeness, and I agree with the above. .Brunetta GeneraMD  .I have reviewed the above documentation for accuracy and completeness, and I agree with the above.  .Brunetta GeneraMD

## 2020-04-30 ENCOUNTER — Inpatient Hospital Stay: Payer: PPO | Admitting: Hematology

## 2020-04-30 ENCOUNTER — Other Ambulatory Visit: Payer: Self-pay

## 2020-04-30 ENCOUNTER — Telehealth: Payer: Self-pay | Admitting: Hematology

## 2020-04-30 VITALS — BP 127/44 | HR 97 | Temp 92.0°F | Resp 18 | Ht 63.0 in | Wt 164.5 lb

## 2020-04-30 DIAGNOSIS — Z95828 Presence of other vascular implants and grafts: Secondary | ICD-10-CM

## 2020-04-30 DIAGNOSIS — C3490 Malignant neoplasm of unspecified part of unspecified bronchus or lung: Secondary | ICD-10-CM | POA: Diagnosis not present

## 2020-04-30 DIAGNOSIS — Z85118 Personal history of other malignant neoplasm of bronchus and lung: Secondary | ICD-10-CM | POA: Diagnosis not present

## 2020-04-30 NOTE — Telephone Encounter (Signed)
Scheduled appointments per 2/21 los. Spoke to patient who is aware of appointments date and times. Gave patient calendar print out.

## 2020-05-01 ENCOUNTER — Telehealth: Payer: Self-pay | Admitting: *Deleted

## 2020-05-01 NOTE — Telephone Encounter (Signed)
Kayla Wyatt called - she said she's decided she does not want to have the port removed until after she has her lab tests done in May. Dr. Irene Limbo informed

## 2020-06-26 DIAGNOSIS — J438 Other emphysema: Secondary | ICD-10-CM | POA: Diagnosis not present

## 2020-06-26 DIAGNOSIS — E559 Vitamin D deficiency, unspecified: Secondary | ICD-10-CM | POA: Diagnosis not present

## 2020-06-26 DIAGNOSIS — I10 Essential (primary) hypertension: Secondary | ICD-10-CM | POA: Diagnosis not present

## 2020-06-26 DIAGNOSIS — C3490 Malignant neoplasm of unspecified part of unspecified bronchus or lung: Secondary | ICD-10-CM | POA: Diagnosis not present

## 2020-06-26 DIAGNOSIS — E7800 Pure hypercholesterolemia, unspecified: Secondary | ICD-10-CM | POA: Diagnosis not present

## 2020-06-26 DIAGNOSIS — R7303 Prediabetes: Secondary | ICD-10-CM | POA: Diagnosis not present

## 2020-06-26 DIAGNOSIS — E039 Hypothyroidism, unspecified: Secondary | ICD-10-CM | POA: Diagnosis not present

## 2020-06-27 ENCOUNTER — Encounter: Payer: Self-pay | Admitting: Family Medicine

## 2020-06-27 ENCOUNTER — Ambulatory Visit (INDEPENDENT_AMBULATORY_CARE_PROVIDER_SITE_OTHER): Payer: PPO | Admitting: Family Medicine

## 2020-06-27 DIAGNOSIS — N813 Complete uterovaginal prolapse: Secondary | ICD-10-CM | POA: Diagnosis not present

## 2020-06-27 NOTE — Progress Notes (Signed)
   Subjective:    Patient ID: Kayla Wyatt is a 65 y.o. female presenting with Pessary Maintenance  on 06/27/2020  HPI: Patient with uterine procedentia. Has pessary which is working well.  Review of Systems  Constitutional: Negative for chills and fever.  Respiratory: Negative for shortness of breath.   Cardiovascular: Negative for chest pain.  Gastrointestinal: Negative for abdominal pain, nausea and vomiting.  Genitourinary: Negative for dysuria.  Skin: Negative for rash.      Objective:    BP (!) 97/54   Pulse 91   Wt 169 lb 6.4 oz (76.8 kg)   BMI 30.01 kg/m  Physical Exam Constitutional:      General: She is not in acute distress.    Appearance: She is well-developed.  HENT:     Head: Normocephalic and atraumatic.  Eyes:     General: No scleral icterus. Cardiovascular:     Rate and Rhythm: Normal rate.  Pulmonary:     Effort: Pulmonary effort is normal.  Abdominal:     Palpations: Abdomen is soft.  Musculoskeletal:     Cervical back: Neck supple.  Skin:    General: Skin is warm and dry.  Neurological:     Mental Status: She is alert and oriented to person, place, and time.    Procedure: Pessary removed and cleaned with soap and water and replaced. Patient tolerated well.      Assessment & Plan:   Problem List Items Addressed This Visit      Unprioritized   Uterine procidentia    Pessary is working well.--continue        Return in about 3 months (around 09/26/2020) for pessary check.  Donnamae Jude 06/27/2020 11:21 AM

## 2020-06-27 NOTE — Assessment & Plan Note (Signed)
Pessary is working well.--continue

## 2020-07-16 ENCOUNTER — Ambulatory Visit (HOSPITAL_COMMUNITY)
Admission: RE | Admit: 2020-07-16 | Discharge: 2020-07-16 | Disposition: A | Discharge status: Home / Self Care | Payer: PPO | Source: Ambulatory Visit | Attending: Hematology | Admitting: Hematology

## 2020-07-16 ENCOUNTER — Other Ambulatory Visit: Payer: Self-pay

## 2020-07-16 DIAGNOSIS — J432 Centrilobular emphysema: Secondary | ICD-10-CM | POA: Diagnosis not present

## 2020-07-16 DIAGNOSIS — C3490 Malignant neoplasm of unspecified part of unspecified bronchus or lung: Secondary | ICD-10-CM | POA: Diagnosis not present

## 2020-07-16 DIAGNOSIS — Z95828 Presence of other vascular implants and grafts: Secondary | ICD-10-CM

## 2020-07-16 DIAGNOSIS — I2510 Atherosclerotic heart disease of native coronary artery without angina pectoris: Secondary | ICD-10-CM | POA: Diagnosis not present

## 2020-07-16 DIAGNOSIS — I700 Atherosclerosis of aorta: Secondary | ICD-10-CM | POA: Insufficient documentation

## 2020-07-16 DIAGNOSIS — I708 Atherosclerosis of other arteries: Secondary | ICD-10-CM | POA: Diagnosis not present

## 2020-07-17 DIAGNOSIS — R7309 Other abnormal glucose: Secondary | ICD-10-CM | POA: Diagnosis not present

## 2020-07-17 DIAGNOSIS — E7800 Pure hypercholesterolemia, unspecified: Secondary | ICD-10-CM | POA: Diagnosis not present

## 2020-07-17 DIAGNOSIS — R946 Abnormal results of thyroid function studies: Secondary | ICD-10-CM | POA: Diagnosis not present

## 2020-07-17 DIAGNOSIS — E559 Vitamin D deficiency, unspecified: Secondary | ICD-10-CM | POA: Diagnosis not present

## 2020-07-17 DIAGNOSIS — E039 Hypothyroidism, unspecified: Secondary | ICD-10-CM | POA: Diagnosis not present

## 2020-07-20 ENCOUNTER — Other Ambulatory Visit: Payer: Self-pay | Admitting: *Deleted

## 2020-07-20 DIAGNOSIS — C3490 Malignant neoplasm of unspecified part of unspecified bronchus or lung: Secondary | ICD-10-CM

## 2020-07-21 NOTE — Progress Notes (Signed)
[name]    HEMATOLOGY/ONCOLOGY CLINIC NOTE  Date of Service: 07/23/2020   Patient Care Team: [name], MD as PCP - General (Family Medicine)  CHIEF COMPLAINTS/PURPOSE OF CONSULTATION:   F/u for continued management of Non-Small Cell lung Carcinoma  HISTORY OF PRESENTING ILLNESS:   Kayla Wyatt is a wonderful 65 y.o. female who has been referred to [place] by Dr [name] for evaluation and management of Non-Small Cell Carcinoma. She is accompanied today by her husband. The pt reports that she is doing well overall.   Patient recently had a significant hospitalization on 04/02/2017 - as per DC summary by [name] " with abdominal pain, hematemesis, melena and ABLA due to GIB.  She underwent clipping of gastric ulcer and received multiple units PRBC but continued to have drop in H/H with bloody stool. NM GI scan was negative for bleeding and she underwent colonoscopy by Dr. [name] 1/31 showing diverticulosis but no signs of bleeding. Hospital course significant for R- gastrocnemius DVT, PAF, incidental findings of mediastinal mass as well as brief episode of hematuria. She was cleared to start on Xarelto on 02/01 for treatment of DVT. On 2/2 am she developed hematemesis with maroon stools and hypotension requiring fluid bolus as well as 2 units PRBCs. She continued to decline requiring intubation, pressors as well as reversal of anticoagulation with Kcentra on 02/3. She underwent UGI revealing large clot with fresh blood in stomach and underwent mesenteric arteriogram with percutaneous coil embolization fo distal tributary of left gastric artery and placement of IVC filter by interventional radiology.   A fib with RVR felt to be due to hemorrhagic shock and she converted to NSR. [name] felt no further cardiac work up indicated and did not recommend initiating anticoagulation in short term. To follow up with [name] after PET scan and routine biopsy after medically stable. She  continued to have bleeding and underwent visceral angiogram with embolization of inferior division of splenic artery and associated gastric arteries on 2/4. No surgical intervention needed per [name] with recommendations to continue monitoring H/H as well as for recurrence of hematochezia. She tolerated extubation on 2/6 and respiratory status improving.  On 2/11, she has episode of unresponsiveness with jerking of bilateral limbs that lasted about 3 minutes. She had amnesia of events but was back to baseline. EEG revealed "focal slowing over the right temporo-occipital regionandoccasional epileptiform discharges over the right occipital region". Head CT reviewed, unremarkable for acute intracranial process. MRI brain done revealing mild biparietal signal abnormality suspicious for PRES. [name] recommended starting patient on Keppra BID with repeat MRI in 2 weeks. New onset seizure likely provoked by PRES--if resolved--Ok to take patient off Keppra. Patient with resultant generalized weakness. CIR recommended due to functional deficits. "  Later as outpatient she had a PET scan on 05/14/17 which showed a hypermetabolic mediastinal mass which was subsequently biopsied with a bronchoscopy on 05/19/17. She notes that she had smoked for about 35-40 years, half a pack of a day. She reports that she has not had any other symptoms related to her smoking; she has never had to be on home oxygen or inhalers. She has recently begun Symbicort. While being in the hospital she lost about 20 lbs but has maintained her weight since being discharged.  The pt and her husband note that she is concerned about what to do if she is not able to go back to work after her 90-day leave. We recommended that they speak with  our social workers here to explore the available options for insurance coverage /disability application etc.  Of note prior to the patient's visit today, pt has had PET/CT completed on 05/14/17 with  results revealing 1. The known mediastinal mass is intensely hypermetabolic, worrisome for malignancy. This could reflect lymphoma or small cell lung cancer. Tissue sampling recommended.2. No peripheral lung mass identified. New patchy ground-glass opacity in the lingula is mildly hypermetabolic and likely inflammatory.  On 05/19/17 the pt had a Fine Needle Aspiration which revealed Non-Small cell lung carcinoma.  Most recent lab results (05/19/17) of CBC  is as follows: all values are WNL except for RBC at 2.90, Hgb at 8.6, HCT at 28.0, RDW at 16.4.  On review of systems, pt reports knee pain, and denies abdominal pains, leg swelling, SOB, CP, and any other symptoms.   On PMHx the pt reports acute blood loss anemia, GI hemorrhage with melena, hypercholesteremia, HTN, persistent atrial fibrillation with rapid ventricular response, Vitamin D deficiency, denies liver and kidney problems. On Social Hx the pt reports having smoked for 35-40 years of half a pack per day. She quit smoking on 03/31/17.  On Family Hx the pt reports that her father had heart disease.  Interval History:   [name] returns today regarding her Non-Small Cell Carcinoma. The patient's last visit with [place] was on 04/30/2020. The pt reports that she is doing well overall.  The pt reports no new symptoms or concerns since the last visit. The pt notes that she was informed her thyroid levels were slightly elevated last week at a visit with her PCP. She continues to f/u regarding this.   Of note since the patient's last visit, pt has had CT Chest wo contrast ([id number]) on 07/16/2020, which revealed "1. Stable band like parenchymal consolidation at the LEFT hilum consistent radiation change. No clear new nodularity or growth. Recommend continued surveillance. 2. No new pulmonary nodularity. 3. Centrilobular emphysema the upper lobes. 4. Coronary artery calcification and Aortic Atherosclerosis (ICD10-I70.0)."  Lab results today  07/23/2020 of CBC w/diff and CMP is as follows: all values are WNL except for RBC of 3.74, Hgb of 11.0, HCT of 34.1, Glucose of 116, BUN of 27.   On review of systems, pt reports decreased mobility in knee and denies acute SOB, leg swelling, sudden weight loss, fevers, chills, changes in breathing, acute headaches, and any other symptoms.  MEDICAL HISTORY:  Past Medical History:  Diagnosis Date  . Acute blood loss anemia 04/02/2017  . Anxiety   . Gastrointestinal hemorrhage with melena   . Headache   . Hypercholesteremia   . Hypertension   . lung ca dx'd 05/2017  . Persistent atrial fibrillation with rapid ventricular response ([place]) 04/07/2017  . Seizures ([place])    04/2017 while in hospital at [facility]  . Vitamin D deficiency     SURGICAL HISTORY: Past Surgical History:  Procedure Laterality Date  . COLONOSCOPY Left 04/09/2017   Procedure: COLONOSCOPY;  Surgeon: Carol Ada, MD;  Location: [place];  Service: Endoscopy;  Laterality: Left;  . ESOPHAGOGASTRODUODENOSCOPY N/A 04/03/2017   Procedure: ESOPHAGOGASTRODUODENOSCOPY (EGD);  Surgeon: Carol Ada, MD;  Location: [place];  Service: Endoscopy;  Laterality: N/A;  . ESOPHAGOGASTRODUODENOSCOPY N/A 08/10/2019   Procedure: ESOPHAGOGASTRODUODENOSCOPY (EGD);  Surgeon: Juanita Craver, MD;  Location: [facility];  Service: Endoscopy;  Laterality: N/A;  . ESOPHAGOGASTRODUODENOSCOPY (EGD) WITH PROPOFOL N/A 04/12/2017   Procedure: ESOPHAGOGASTRODUODENOSCOPY (EGD) WITH PROPOFOL;  Surgeon: Milus Banister, MD;  Location: [place];  Service: Endoscopy;  Laterality: N/A;  . FOREIGN BODY REMOVAL N/A 08/10/2019   Procedure: FOREIGN BODY REMOVAL;  Surgeon: Juanita Craver, MD;  Location: [facility];  Service: Endoscopy;  Laterality: N/A;  . IR ANGIOGRAM SELECTIVE EACH ADDITIONAL VESSEL  04/12/2017  . IR ANGIOGRAM SELECTIVE EACH ADDITIONAL VESSEL  04/13/2017  . IR ANGIOGRAM SELECTIVE EACH ADDITIONAL VESSEL  04/13/2017  . IR ANGIOGRAM SELECTIVE EACH  ADDITIONAL VESSEL  04/13/2017  . IR ANGIOGRAM VISCERAL SELECTIVE  04/12/2017  . IR ANGIOGRAM VISCERAL SELECTIVE  04/13/2017  . IR EMBO ART  VEN HEMORR LYMPH EXTRAV  INC GUIDE ROADMAPPING  04/12/2017  . IR EMBO ART  VEN HEMORR LYMPH EXTRAV  INC GUIDE ROADMAPPING  04/13/2017  . IR FLUORO GUIDE CV LINE RIGHT  04/13/2017  . IR FLUORO GUIDE PORT INSERTION RIGHT  07/27/2017  . IR IVC FILTER PLMT / S&I /IMG GUID/MOD SED  04/12/2017  . IR US GUIDE VASC ACCESS RIGHT  04/12/2017  . IR US GUIDE VASC ACCESS RIGHT  04/13/2017  . IR US GUIDE VASC ACCESS RIGHT  04/13/2017  . IR US GUIDE VASC ACCESS RIGHT  07/27/2017  . LEG SURGERY  1974   Blood Clot Removal   . VIDEO BRONCHOSCOPY WITH ENDOBRONCHIAL ULTRASOUND N/A 05/19/2017   Procedure: VIDEO BRONCHOSCOPY WITH ENDOBRONCHIAL ULTRASOUND;  Surgeon: Grace Isaac, MD;  Location: [place];  Service: Thoracic;  Laterality: N/A;    SOCIAL HISTORY: Social History   Socioeconomic History  . Marital status: Single    Spouse name: Not on file  . Number of children: Not on file  . Years of education: Not on file  . Highest education level: Not on file  Occupational History  . Occupation: Has to lift heavy boxes at times.    Employer: [employer]  Tobacco Use  . Smoking status: Former Smoker    Packs/day: 0.50    Years: 40.00    Pack years: 20.00    Types: Cigarettes    Quit date: 03/31/2017    Years since quitting: 3.3  . Smokeless tobacco: Never Used  Vaping Use  . Vaping Use: Never used  Substance and Sexual Activity  . Alcohol use: No  . Drug use: No  . Sexual activity: Not Currently  Other Topics Concern  . Not on file  Social History Narrative  . Not on file   Social Determinants of Health   Financial Resource Strain: Not on file  Food Insecurity: No Food Insecurity  . Worried About Charity fundraiser in the Last Year: Never true  . Ran Out of Food in the Last Year: Never true  Transportation Needs: No Transportation Needs  . Lack of Transportation  (Medical): No  . Lack of Transportation (Non-Medical): No  Physical Activity: Not on file  Stress: Not on file  Social Connections: Not on file  Intimate Partner Violence: Not on file    FAMILY HISTORY: Family History  Problem Relation Age of Onset  . Heart disease Father     ALLERGIES:  has No Known Allergies.  MEDICATIONS:  Current Outpatient Medications  Medication Sig Dispense Refill  . acetaminophen (TYLENOL) 325 MG tablet Take 1-2 tablets (325-650 mg total) by mouth every 4 (four) hours as needed for mild pain.    . cholecalciferol (VITAMIN D) 1000 units tablet Take 1,000 Units by mouth 2 (two) times daily.    [name] levothyroxine (SYNTHROID) 88 MCG tablet TAKE 1 TABLET(88 MCG) BY MOUTH DAILY 30 tablet 0  . lidocaine-prilocaine (EMLA)  cream Apply to affected area once 30 g 3  . lisinopril-hydrochlorothiazide (PRINZIDE,ZESTORETIC) 20-12.5 MG tablet Take 1 tablet by mouth daily.    . pantoprazole (PROTONIX) 40 MG tablet Take 1 tablet (40 mg total) by mouth daily. 30 tablet 3  . triamcinolone cream (KENALOG) 0.1 % Apply 1 application topically 2 (two) times daily.      No current facility-administered medications for this visit.   Facility-Administered Medications Ordered in Other Visits  Medication Dose Route Frequency Provider Last Rate Last Admin  . sodium chloride flush (NS) 0.9 % injection 10 mL  10 mL Intracatheter PRN Brunetta Genera, MD   10 mL at 08/10/18 1558    REVIEW OF SYSTEMS:   10 Point review of Systems was done is negative except as noted above.  PHYSICAL EXAMINATION: ECOG FS:1 - Symptomatic but completely ambulatory  Vitals:   07/23/20 1032  BP: (!) 140/119  Pulse: (!) 128  Resp: 13  Temp: (!) 96.9 F (36.1 C)  SpO2: 99%   Wt Readings from Last 3 Encounters:  07/23/20 168 lb 4.8 oz (76.3 kg)  06/27/20 169 lb 6.4 oz (76.8 kg)  04/30/20 164 lb 8 oz (74.6 kg)   Body mass index is 29.81 kg/m.     GENERAL:alert, in no acute distress and  comfortable SKIN: no acute rashes, no significant lesions EYES: conjunctiva are pink and non-injected, sclera anicteric OROPHARYNX: MMM, no exudates, no oropharyngeal erythema or ulceration NECK: supple, no JVD LYMPH:  no palpable lymphadenopathy in the cervical, axillary or inguinal regions LUNGS: clear to auscultation b/l with normal respiratory effort HEART: regular rate & rhythm ABDOMEN:  normoactive bowel sounds , non tender, not distended. Extremity: no pedal edema PSYCH: alert & oriented x 3 with fluent speech NEURO: no focal motor/sensory deficits   LABORATORY DATA:  I have reviewed the data as listed  . CBC Latest Ref Rng & Units 07/23/2020 04/26/2020 12/15/2019  WBC 4.0 - 10.5 K/uL 5.9 6.6 5.8  Hemoglobin 12.0 - 15.0 g/dL 11.0(L) 10.7(L) 10.4(L)  Hematocrit 36.0 - 46.0 % 34.1(L) 33.0(L) 32.0(L)  Platelets 150 - 400 K/uL 245 260 246    CMP Latest Ref Rng & Units 07/23/2020 04/26/2020 12/15/2019  Glucose 70 - 99 mg/dL 116(H) 112(H) 85  BUN 8 - 23 mg/dL 27(H) 30(H) 21  Creatinine 0.44 - 1.00 mg/dL 0.94 1.03(H) 0.85  Sodium 135 - 145 mmol/L 139 137 140  Potassium 3.5 - 5.1 mmol/L 3.9 3.9 3.4(L)  Chloride 98 - 111 mmol/L 107 106 106  CO2 22 - 32 mmol/L _0 Calcium 8.9 - 10.3 mg/dL 9.6 8.9 9.7  Total Protein 6.5 - 8.1 g/dL 7.1 7.1 7.1  Total Bilirubin 0.3 - 1.2 mg/dL 0.3 0.3 0.3  Alkaline Phos 38 - 126 U/L 70 76 73  AST 15 - 41 U/L _1 ALT 0 - 44 U/L _2 01/26/2019 CT Abdomen Pelvis W Contrast (Accession [id number])    01/26/2019 CT Chest W Contrast (Accession [id number])   05/19/17 Fine Needle Aspiration:  05/19/17 Bronchial Washing Specimen B:   RADIOGRAPHIC STUDIES: I have personally reviewed the radiological images as listed and agreed with the findings in the report. CT Chest Wo Contrast  Result Date: 07/16/2020 CLINICAL DATA:  Lung nodule diagnosed 2016. non-small cell lung cancer restaging. EXAM: CT CHEST WITHOUT CONTRAST TECHNIQUE:  Multidetector CT imaging of the chest was performed following the standard protocol without IV contrast. COMPARISON:  Coronary artery calcification and aortic atherosclerotic  calcification. FINDINGS: Cardiovascular: Coronary artery calcification and aortic atherosclerotic calcification. Port in the anterior chest wall with tip in distal SVC. Mediastinum/Nodes: No axillary or supraclavicular adenopathy. No mediastinal or hilar adenopathy. No pericardial fluid. Esophagus normal. Lungs/Pleura: Band like consolidation along the LEFT hilum similar to comparison exam. Consolidation measures 16 mm in greatest thickness (image 56/2) compared with 17 mm on prior. No discrete nodularity. Centrilobular emphysema the upper lobes. Small 1 mm nodule in the LEFT upper lobe on image 55/5 is unchanged. Upper Abdomen: Limited view of the liver, kidneys, pancreas are unremarkable. Normal adrenal glands. Musculoskeletal: No aggressive osseous lesion. IMPRESSION: 1. Stable band like parenchymal consolidation at the LEFT hilum consistent radiation change. No clear new nodularity or growth. Recommend continued surveillance. 2. No new pulmonary nodularity. 3. Centrilobular emphysema the upper lobes. 4. Coronary artery calcification and Aortic Atherosclerosis (ICD10-I70.0). Electronically Signed   By: Suzy Bouchard M.D.   On: 07/16/2020 15:23   MRI brain 04/21/2017: IMPRESSION: 1. Motion degraded examination. 2. 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 3. Otherwise negative MRI of the head with and without contrast for age.  Electronically Signed   By: Elon Alas M.D.   On: 04/22/2017 00:34    ASSESSMENT & PLAN:   65 y.o. female with  1.  Non-Small Cell Carcinoma atleast Stage IIIB  Not enough tissue for further characterization or mutation testing. Presenting with mediastinal nodal mass - TxN2 Mx (Atleast Stage III disease) ? Lingular primary vs  inflammation. No overt evidence of metastatic disease.  -EGFR mutation studies on blood negative. Patient is s/p 3 cycles of Carboplatin/taxol -resolution left upper lobe ground-glass opacity has resolved in the interval. 08/19/17 PET revealed  No significant change in size of hypermetabolic AP window mass. The degree of FDG uptake is slightly increased in the interval. 2. Persistent hypermetabolic focus involving the distal aspect of the tongue within SUV max of 16.46. Similar to previous exam. (no clinical co-relate). Lung involvement has decreased.   08/19/17 Brain MRI revealed No evidence of intracranial metastases. Interval resolution of bilateral parietal signal abnormality favoring PRES  Completed concurrent Carbo/taxol chemotherapy + RT  12/24/17 PET/CT revealed Marked interval response to therapy with large AP window lymph nodes seen on the previous study almost imperceptible on today's exam. Hypermetabolism within this lesion has resolved with FDG accumulation now at background blood pool levels. 2. Small focus of hypermetabolism in the anal region, similar to prior and indeterminate by PET imaging. 3. Hypermetabolic focus at the distal tongue has decreased in the Interval. 4. Marked pelvic floor laxity with cystocele. 5. Stable tiny left lung nodules. 6.  Aortic Atherosclerois. 7.  Emphysema.   04/03/17 endoscopy did not reveal esophageal thickening    04/27/18 CT Chest revealed Stable exam. No new or progressive findings. 2. Stable mild circumferential wall thickening mid esophagus. Esophagitis is a consideration. 3. Stable tiny left pulmonary nodules. 4.  Emphysema. 5.  Aortic Atherosclerois.  01/26/2019 CT Abdomen Pelvis W Contrast (Accession [id number]) revealed "1. Patchy areas of consolidation, bronchiectasis and ground-glass in the medial left hemithorax, presumably treatment related. No evidence of metastatic disease.2. Extensive debris in the mainstem bronchi, bronchus intermedius and  left upper lobe bronchus."    07/27/2019 CT Chest ([id number]) revealed "Nodularity along the pleural surface in the posterior left chest."  2. Bone Pain from neulasta - managed with Tylenol  3. Previous h/ot SZ -currently on Keppra. MRI brain - resolution of findings of PRES  4. H/o rt calf  DVT - off anticoagulation due to massive GI bleeding. S/p IVC filter. No issues currently  5. S/p previous GI bleeding from Gastric ulcer --needing clipping and arterial embolization. On PPI BID Ulcers seen to be healed with recent April 2020 repeat endoscopy  6. H/o ctx /steroid related thrush --now resolved.  8. Elevated TSH/Hypothyroidism -- ? Related to immunotherapy 07/13/18 Thyroid functions revealed TSH at 62.91 and Free T4 at 0.30 Began low dose 6mg Levothyroxine and provide endocrinology referral for further optimization. Recommend taking Levothyroxine 1.5 hours before to breakfast with only water and no other medications.  F/u with Dr. SKelton Pillarin Endocrinology   PLAN: -Discussed pt labwork today, 07/23/2020; Hgb improved, blood counts stable, chemistries stable.  -Discussed pt CT Chest wo contrast ([id number] on 07/16/2020; only radiation changes. No new findings or progression. -Advised pt that things are very stable at this time based on labs and imaging. Will switch visits to every 6 months with repeat scan. -Discussed possibility of removing Port. The pt wishes to keep this in and is agreeable to flushes q370month -Recommended again pt receive her COVID vaccinations. The pt still does not desire to get these. -Recommended pt increase her water intake and drink 48-64 oz water daily. -No lab, clinical, evidence of Non-Small Cell Carcinoma recurrence at this time. -Will see back in 6 months with labs. Will get CT C/A/P 1 week prior.    FOLLOW UP: Portflush in 12 weeks CT chest wo contrast in 23 weeks RTC with Dr KaIrene Limboith portflush and labs in 24 weeks   The total time spent  in the appointment was 20 minutes and more than 50% was on counseling and direct patient cares.  All of the patient's questions were answered with apparent satisfaction. The patient knows to call the clinic with any problems, questions or concerns.   GaSullivan LoneD MSCloverdaleAHIVMS SCJavon Bea Hospital Dba Mercy Health Hospital Rockton AveTCommunity Hospital Eastematology/Oncology Physician CoUnc Hospitals At Wakebrook(Office):       [phone] cell):  [phone]):           [phone]/16/2022 11:22 AM  I, RoReinaldo Raddleam acting as scribe for Dr. GaSullivan LoneMD.     .I have reviewed the above documentation for accuracy and completeness, and I agree with the above. .GBrunetta GeneraD

## 2020-07-23 ENCOUNTER — Other Ambulatory Visit: Payer: PPO

## 2020-07-23 ENCOUNTER — Other Ambulatory Visit: Payer: Self-pay

## 2020-07-23 ENCOUNTER — Inpatient Hospital Stay: Payer: PPO | Admitting: Hematology

## 2020-07-23 ENCOUNTER — Inpatient Hospital Stay: Payer: PPO | Attending: Hematology

## 2020-07-23 VITALS — BP 140/119 | HR 128 | Temp 96.9°F | Resp 13 | Ht 63.0 in | Wt 168.3 lb

## 2020-07-23 DIAGNOSIS — E039 Hypothyroidism, unspecified: Secondary | ICD-10-CM | POA: Diagnosis not present

## 2020-07-23 DIAGNOSIS — C3490 Malignant neoplasm of unspecified part of unspecified bronchus or lung: Secondary | ICD-10-CM

## 2020-07-23 DIAGNOSIS — Z9221 Personal history of antineoplastic chemotherapy: Secondary | ICD-10-CM | POA: Diagnosis not present

## 2020-07-23 DIAGNOSIS — Z86718 Personal history of other venous thrombosis and embolism: Secondary | ICD-10-CM | POA: Insufficient documentation

## 2020-07-23 DIAGNOSIS — Z87891 Personal history of nicotine dependence: Secondary | ICD-10-CM | POA: Diagnosis not present

## 2020-07-23 DIAGNOSIS — Z85118 Personal history of other malignant neoplasm of bronchus and lung: Secondary | ICD-10-CM | POA: Insufficient documentation

## 2020-07-23 DIAGNOSIS — Z95828 Presence of other vascular implants and grafts: Secondary | ICD-10-CM

## 2020-07-23 LAB — CBC WITH DIFFERENTIAL (CANCER CENTER ONLY)
Abs Immature Granulocytes: 0.02 10*3/uL (ref 0.00–0.07)
Basophils Absolute: 0 10*3/uL (ref 0.0–0.1)
Basophils Relative: 1 %
Eosinophils Absolute: 0.2 10*3/uL (ref 0.0–0.5)
Eosinophils Relative: 3 %
HCT: 34.1 % — ABNORMAL LOW (ref 36.0–46.0)
Hemoglobin: 11 g/dL — ABNORMAL LOW (ref 12.0–15.0)
Immature Granulocytes: 0 %
Lymphocytes Relative: 24 %
Lymphs Abs: 1.4 10*3/uL (ref 0.7–4.0)
MCH: 29.4 pg (ref 26.0–34.0)
MCHC: 32.3 g/dL (ref 30.0–36.0)
MCV: 91.2 fL (ref 80.0–100.0)
Monocytes Absolute: 0.5 10*3/uL (ref 0.1–1.0)
Monocytes Relative: 9 %
Neutro Abs: 3.8 10*3/uL (ref 1.7–7.7)
Neutrophils Relative %: 63 %
Platelet Count: 245 10*3/uL (ref 150–400)
RBC: 3.74 MIL/uL — ABNORMAL LOW (ref 3.87–5.11)
RDW: 14.3 % (ref 11.5–15.5)
WBC Count: 5.9 10*3/uL (ref 4.0–10.5)
nRBC: 0 % (ref 0.0–0.2)

## 2020-07-23 LAB — CMP (CANCER CENTER ONLY)
ALT: 10 U/L (ref 0–44)
AST: 18 U/L (ref 15–41)
Albumin: 3.6 g/dL (ref 3.5–5.0)
Alkaline Phosphatase: 70 U/L (ref 38–126)
Anion gap: 8 (ref 5–15)
BUN: 27 mg/dL — ABNORMAL HIGH (ref 8–23)
CO2: 24 mmol/L (ref 22–32)
Calcium: 9.6 mg/dL (ref 8.9–10.3)
Chloride: 107 mmol/L (ref 98–111)
Creatinine: 0.94 mg/dL (ref 0.44–1.00)
GFR, Estimated: >60 mL/min (ref >60)
Glucose, Bld: 116 mg/dL — ABNORMAL HIGH (ref 70–99)
Potassium: 3.9 mmol/L (ref 3.5–5.1)
Sodium: 139 mmol/L (ref 135–145)
Total Bilirubin: 0.3 mg/dL (ref 0.3–1.2)
Total Protein: 7.1 g/dL (ref 6.5–8.1)

## 2020-07-23 MED ORDER — SODIUM CHLORIDE 0.9% FLUSH
10.0000 mL | INTRAVENOUS | Status: DC | PRN
  Administered 2020-07-23: 10 mL
  Filled 2020-07-23: qty 10

## 2020-07-23 MED ORDER — HEPARIN SOD (PORK) LOCK FLUSH 100 UNIT/ML IV SOLN
500.0000 [IU] | Freq: Once | INTRAVENOUS | Status: AC | PRN
  Administered 2020-07-23: 500 [IU]
  Filled 2020-07-23: qty 5

## 2020-07-24 ENCOUNTER — Telehealth: Payer: Self-pay | Admitting: Hematology

## 2020-07-24 NOTE — Telephone Encounter (Signed)
Scheduled follow-up appointments per 5/16 los. Patient is aware.

## 2020-08-01 DIAGNOSIS — E7800 Pure hypercholesterolemia, unspecified: Secondary | ICD-10-CM | POA: Diagnosis not present

## 2020-08-01 DIAGNOSIS — J438 Other emphysema: Secondary | ICD-10-CM | POA: Diagnosis not present

## 2020-08-01 DIAGNOSIS — I10 Essential (primary) hypertension: Secondary | ICD-10-CM | POA: Diagnosis not present

## 2020-08-01 DIAGNOSIS — E039 Hypothyroidism, unspecified: Secondary | ICD-10-CM | POA: Diagnosis not present

## 2020-08-29 DIAGNOSIS — E039 Hypothyroidism, unspecified: Secondary | ICD-10-CM | POA: Diagnosis not present

## 2020-08-29 DIAGNOSIS — I10 Essential (primary) hypertension: Secondary | ICD-10-CM | POA: Diagnosis not present

## 2020-08-29 DIAGNOSIS — E7800 Pure hypercholesterolemia, unspecified: Secondary | ICD-10-CM | POA: Diagnosis not present

## 2020-08-29 DIAGNOSIS — J438 Other emphysema: Secondary | ICD-10-CM | POA: Diagnosis not present

## 2020-09-18 ENCOUNTER — Encounter: Payer: Self-pay | Admitting: Family Medicine

## 2020-09-18 ENCOUNTER — Other Ambulatory Visit: Payer: Self-pay

## 2020-09-18 ENCOUNTER — Ambulatory Visit: Payer: PPO | Admitting: Family Medicine

## 2020-09-18 VITALS — BP 134/80 | Ht 63.0 in | Wt 172.3 lb

## 2020-09-18 DIAGNOSIS — N813 Complete uterovaginal prolapse: Secondary | ICD-10-CM

## 2020-09-18 NOTE — Progress Notes (Signed)
   Subjective:    Patient ID: Kayla Wyatt is a 65 y.o. female presenting with No chief complaint on file.  on 09/18/2020  HPI: The patient presented today for a pessary check. She reports no vaginal bleeding or discharge. She denies pelvic discomfort and difficulty urinating or moving her bowels.  Review of Systems  Constitutional:  Negative for chills and fever.  Respiratory:  Negative for shortness of breath.   Cardiovascular:  Negative for chest pain.  Gastrointestinal:  Negative for abdominal pain, nausea and vomiting.  Genitourinary:  Negative for dysuria.  Skin:  Negative for rash.     Objective:    BP 134/80   Ht 5\' 3"  (1.6 m)   Wt 172 lb 4.8 oz (78.2 kg)   BMI 30.52 kg/m  Physical Exam Constitutional:      General: She is not in acute distress.    Appearance: She is well-developed.  HENT:     Head: Normocephalic and atraumatic.  Eyes:     General: No scleral icterus. Cardiovascular:     Rate and Rhythm: Normal rate.  Pulmonary:     Effort: Pulmonary effort is normal.  Abdominal:     Palpations: Abdomen is soft.  Musculoskeletal:     Cervical back: Neck supple.  Skin:    General: Skin is warm and dry.  Neurological:     Mental Status: She is alert and oriented to person, place, and time.   ?The patient's ring with support pessary was removed, cleaned and replaced without complications.     Assessment & Plan:   Problem List Items Addressed This Visit       Unprioritized   Uterine procidentia - Primary    Working well. Continue.         Return in about 3 months (around 12/19/2020) for pessary maintenance.  Donnamae Jude 09/18/2020 11:52 AM

## 2020-09-18 NOTE — Assessment & Plan Note (Signed)
Working well. Continue.

## 2020-09-19 DIAGNOSIS — C3490 Malignant neoplasm of unspecified part of unspecified bronchus or lung: Secondary | ICD-10-CM | POA: Diagnosis not present

## 2020-09-19 DIAGNOSIS — I10 Essential (primary) hypertension: Secondary | ICD-10-CM | POA: Diagnosis not present

## 2020-09-19 DIAGNOSIS — E7800 Pure hypercholesterolemia, unspecified: Secondary | ICD-10-CM | POA: Diagnosis not present

## 2020-09-19 DIAGNOSIS — E039 Hypothyroidism, unspecified: Secondary | ICD-10-CM | POA: Diagnosis not present

## 2020-09-19 DIAGNOSIS — J438 Other emphysema: Secondary | ICD-10-CM | POA: Diagnosis not present

## 2020-10-15 ENCOUNTER — Inpatient Hospital Stay: Payer: PPO | Attending: Hematology

## 2020-10-15 ENCOUNTER — Other Ambulatory Visit: Payer: Self-pay

## 2020-10-15 DIAGNOSIS — Z452 Encounter for adjustment and management of vascular access device: Secondary | ICD-10-CM | POA: Insufficient documentation

## 2020-10-15 DIAGNOSIS — Z95828 Presence of other vascular implants and grafts: Secondary | ICD-10-CM

## 2020-10-15 DIAGNOSIS — Z85118 Personal history of other malignant neoplasm of bronchus and lung: Secondary | ICD-10-CM | POA: Diagnosis not present

## 2020-10-15 DIAGNOSIS — C3490 Malignant neoplasm of unspecified part of unspecified bronchus or lung: Secondary | ICD-10-CM

## 2020-10-15 MED ORDER — SODIUM CHLORIDE 0.9% FLUSH
10.0000 mL | INTRAVENOUS | Status: DC | PRN
  Administered 2020-10-15: 10 mL
  Filled 2020-10-15: qty 10

## 2020-10-15 MED ORDER — HEPARIN SOD (PORK) LOCK FLUSH 100 UNIT/ML IV SOLN
500.0000 [IU] | Freq: Once | INTRAVENOUS | Status: AC | PRN
  Administered 2020-10-15: 500 [IU]
  Filled 2020-10-15: qty 5

## 2020-12-26 DIAGNOSIS — I10 Essential (primary) hypertension: Secondary | ICD-10-CM | POA: Diagnosis not present

## 2020-12-26 DIAGNOSIS — I700 Atherosclerosis of aorta: Secondary | ICD-10-CM | POA: Diagnosis not present

## 2020-12-26 DIAGNOSIS — N390 Urinary tract infection, site not specified: Secondary | ICD-10-CM | POA: Diagnosis not present

## 2020-12-26 DIAGNOSIS — J438 Other emphysema: Secondary | ICD-10-CM | POA: Diagnosis not present

## 2020-12-26 DIAGNOSIS — C3490 Malignant neoplasm of unspecified part of unspecified bronchus or lung: Secondary | ICD-10-CM | POA: Diagnosis not present

## 2020-12-26 DIAGNOSIS — K219 Gastro-esophageal reflux disease without esophagitis: Secondary | ICD-10-CM | POA: Diagnosis not present

## 2020-12-26 DIAGNOSIS — E7800 Pure hypercholesterolemia, unspecified: Secondary | ICD-10-CM | POA: Diagnosis not present

## 2020-12-26 DIAGNOSIS — E039 Hypothyroidism, unspecified: Secondary | ICD-10-CM | POA: Diagnosis not present

## 2020-12-26 DIAGNOSIS — R7303 Prediabetes: Secondary | ICD-10-CM | POA: Diagnosis not present

## 2020-12-26 DIAGNOSIS — Z0000 Encounter for general adult medical examination without abnormal findings: Secondary | ICD-10-CM | POA: Diagnosis not present

## 2020-12-28 ENCOUNTER — Other Ambulatory Visit: Payer: Self-pay

## 2020-12-28 ENCOUNTER — Ambulatory Visit: Payer: PPO | Admitting: Obstetrics & Gynecology

## 2020-12-28 ENCOUNTER — Encounter: Payer: Self-pay | Admitting: Obstetrics & Gynecology

## 2020-12-28 VITALS — BP 112/53 | HR 91 | Wt 173.4 lb

## 2020-12-28 DIAGNOSIS — Z4689 Encounter for fitting and adjustment of other specified devices: Secondary | ICD-10-CM

## 2020-12-28 DIAGNOSIS — N813 Complete uterovaginal prolapse: Secondary | ICD-10-CM

## 2020-12-28 NOTE — Progress Notes (Signed)
    GYNECOLOGY OFFICE VISIT NOTE   Kayla Wyatt is a 65 y.o. PMP female with uterine procidentia, here for pessary maintenance.  #4 Ring with support pessary in place. No issues with the pessary, satisfied with this method.    Blood pressure (!) 112/53, pulse 91, weight 173 lb 6.4 oz (78.7 kg).  On pelvic exam, atrophic introitus noted but no prolapse.  Pessary in place. After adequate lubrication of the introitus, the pessary was removed.  It was cleaned with soap and water.  Vaginal speculum exam done, no lacerations or other concerning lesions noted. Pessary was replaced with adequate lubrication. Patient tolerated the procedure well.    She will return in three months for pessary maintenance.  Bleeding precautions reviewed.       Verita Schneiders, MD, [place] for [employer], [place]

## 2021-01-01 ENCOUNTER — Ambulatory Visit: Payer: PPO | Admitting: Family Medicine

## 2021-01-01 ENCOUNTER — Ambulatory Visit (HOSPITAL_COMMUNITY)
Admission: RE | Admit: 2021-01-01 | Discharge: 2021-01-01 | Disposition: A | Discharge status: Home / Self Care | Payer: PPO | Source: Ambulatory Visit | Attending: Hematology | Admitting: Hematology

## 2021-01-01 ENCOUNTER — Other Ambulatory Visit: Payer: Self-pay

## 2021-01-01 DIAGNOSIS — J439 Emphysema, unspecified: Secondary | ICD-10-CM | POA: Diagnosis not present

## 2021-01-01 DIAGNOSIS — C3490 Malignant neoplasm of unspecified part of unspecified bronchus or lung: Secondary | ICD-10-CM | POA: Insufficient documentation

## 2021-01-01 DIAGNOSIS — I700 Atherosclerosis of aorta: Secondary | ICD-10-CM | POA: Diagnosis not present

## 2021-01-01 DIAGNOSIS — J479 Bronchiectasis, uncomplicated: Secondary | ICD-10-CM | POA: Diagnosis not present

## 2021-01-04 ENCOUNTER — Other Ambulatory Visit: Payer: Self-pay

## 2021-01-04 DIAGNOSIS — C3490 Malignant neoplasm of unspecified part of unspecified bronchus or lung: Secondary | ICD-10-CM

## 2021-01-07 ENCOUNTER — Inpatient Hospital Stay: Payer: PPO | Attending: Hematology | Admitting: Hematology

## 2021-01-07 ENCOUNTER — Other Ambulatory Visit: Payer: Self-pay

## 2021-01-07 ENCOUNTER — Inpatient Hospital Stay: Payer: PPO

## 2021-01-07 VITALS — BP 124/46 | HR 92 | Temp 97.3°F | Resp 18 | Wt 174.4 lb

## 2021-01-07 DIAGNOSIS — C3490 Malignant neoplasm of unspecified part of unspecified bronchus or lung: Secondary | ICD-10-CM

## 2021-01-07 DIAGNOSIS — E039 Hypothyroidism, unspecified: Secondary | ICD-10-CM | POA: Diagnosis not present

## 2021-01-07 DIAGNOSIS — Z85118 Personal history of other malignant neoplasm of bronchus and lung: Secondary | ICD-10-CM | POA: Insufficient documentation

## 2021-01-07 DIAGNOSIS — Z86718 Personal history of other venous thrombosis and embolism: Secondary | ICD-10-CM | POA: Insufficient documentation

## 2021-01-07 DIAGNOSIS — Z95828 Presence of other vascular implants and grafts: Secondary | ICD-10-CM

## 2021-01-07 DIAGNOSIS — Z79899 Other long term (current) drug therapy: Secondary | ICD-10-CM | POA: Diagnosis not present

## 2021-01-07 DIAGNOSIS — M898X9 Other specified disorders of bone, unspecified site: Secondary | ICD-10-CM | POA: Insufficient documentation

## 2021-01-07 LAB — CBC WITH DIFFERENTIAL (CANCER CENTER ONLY)
Abs Immature Granulocytes: 0.03 10*3/uL (ref 0.00–0.07)
Basophils Absolute: 0 10*3/uL (ref 0.0–0.1)
Basophils Relative: 1 %
Eosinophils Absolute: 0.1 10*3/uL (ref 0.0–0.5)
Eosinophils Relative: 3 %
HCT: 33.6 % — ABNORMAL LOW (ref 36.0–46.0)
Hemoglobin: 11.3 g/dL — ABNORMAL LOW (ref 12.0–15.0)
Immature Granulocytes: 1 %
Lymphocytes Relative: 26 %
Lymphs Abs: 1.5 10*3/uL (ref 0.7–4.0)
MCH: 31.5 pg (ref 26.0–34.0)
MCHC: 33.6 g/dL (ref 30.0–36.0)
MCV: 93.6 fL (ref 80.0–100.0)
Monocytes Absolute: 0.4 10*3/uL (ref 0.1–1.0)
Monocytes Relative: 8 %
Neutro Abs: 3.6 10*3/uL (ref 1.7–7.7)
Neutrophils Relative %: 61 %
Platelet Count: 233 10*3/uL (ref 150–400)
RBC: 3.59 MIL/uL — ABNORMAL LOW (ref 3.87–5.11)
RDW: 14.8 % (ref 11.5–15.5)
WBC Count: 5.7 10*3/uL (ref 4.0–10.5)
nRBC: 0 % (ref 0.0–0.2)

## 2021-01-07 LAB — CMP (CANCER CENTER ONLY)
ALT: 11 U/L (ref 0–44)
AST: 17 U/L (ref 15–41)
Albumin: 3.8 g/dL (ref 3.5–5.0)
Alkaline Phosphatase: 63 U/L (ref 38–126)
Anion gap: 8 (ref 5–15)
BUN: 28 mg/dL — ABNORMAL HIGH (ref 8–23)
CO2: 22 mmol/L (ref 22–32)
Calcium: 9.2 mg/dL (ref 8.9–10.3)
Chloride: 111 mmol/L (ref 98–111)
Creatinine: 0.92 mg/dL (ref 0.44–1.00)
GFR, Estimated: >60 mL/min (ref >60)
Glucose, Bld: 118 mg/dL — ABNORMAL HIGH (ref 70–99)
Potassium: 4.4 mmol/L (ref 3.5–5.1)
Sodium: 141 mmol/L (ref 135–145)
Total Bilirubin: 0.3 mg/dL (ref 0.3–1.2)
Total Protein: 7 g/dL (ref 6.5–8.1)

## 2021-01-07 MED ORDER — HEPARIN SOD (PORK) LOCK FLUSH 100 UNIT/ML IV SOLN
500.0000 [IU] | Freq: Once | INTRAVENOUS | Status: AC | PRN
  Administered 2021-01-07: 500 [IU]

## 2021-01-07 MED ORDER — SODIUM CHLORIDE 0.9% FLUSH
10.0000 mL | INTRAVENOUS | Status: DC | PRN
  Administered 2021-01-07: 10 mL

## 2021-01-08 DIAGNOSIS — J438 Other emphysema: Secondary | ICD-10-CM | POA: Diagnosis not present

## 2021-01-08 DIAGNOSIS — E039 Hypothyroidism, unspecified: Secondary | ICD-10-CM | POA: Diagnosis not present

## 2021-01-08 DIAGNOSIS — K219 Gastro-esophageal reflux disease without esophagitis: Secondary | ICD-10-CM | POA: Diagnosis not present

## 2021-01-08 DIAGNOSIS — E7800 Pure hypercholesterolemia, unspecified: Secondary | ICD-10-CM | POA: Diagnosis not present

## 2021-01-08 DIAGNOSIS — I10 Essential (primary) hypertension: Secondary | ICD-10-CM | POA: Diagnosis not present

## 2021-01-13 ENCOUNTER — Encounter: Payer: Self-pay | Admitting: Hematology

## 2021-01-13 NOTE — Progress Notes (Signed)
[name]    HEMATOLOGY/ONCOLOGY CLINIC NOTE  Date of Service: .01/07/2021   Patient Care Team: Kayla Frees, MD as PCP - General (Family Medicine)  CHIEF COMPLAINTS/PURPOSE OF CONSULTATION:   F/u for continued management of Non-Small Cell lung Carcinoma  HISTORY OF PRESENTING ILLNESS:   Kayla Wyatt is a wonderful 65 y.o. female who has been referred to [place] by Kayla Wyatt for evaluation and management of Non-Small Cell Carcinoma. She is accompanied today by her husband. The pt reports that she is doing well overall.   Patient recently had a significant hospitalization on 04/02/2017 - as per DC summary by Kayla Wyatt " with abdominal pain, hematemesis, melena and ABLA due to GIB.  She underwent clipping of gastric ulcer and received multiple units PRBC but continued to have drop in H/H with bloody stool. NM GI scan was negative for bleeding and she underwent colonoscopy by Kayla. Benson Wyatt 1/31 showing diverticulosis but no signs of bleeding. Hospital course significant for R- gastrocnemius DVT, PAF, incidental findings of mediastinal mass as well as brief episode of hematuria. She was cleared to start on Xarelto on 02/01 for treatment of DVT.  On  2/2 am she developed hematemesis with maroon stools and hypotension requiring fluid bolus as well as 2 units PRBCs. She continued to decline requiring intubation, pressors as well as reversal of anticoagulation with Kcentra on 02/3. She underwent UGI revealing large clot with fresh blood in stomach and underwent mesenteric arteriogram with percutaneous coil embolization fo distal tributary of left gastric artery and placement of IVC filter by interventional radiology.    A fib with RVR felt to be due to hemorrhagic shock and she converted to NSR. Kayla Wyatt felt no further cardiac work up indicated and did not recommend initiating anticoagulation in short term. To follow up with Kayla Wyatt after PET scan and routine biopsy after medically stable.  She  continued to have bleeding and underwent visceral angiogram with embolization of inferior division of splenic artery and associated gastric arteries on 2/4. No surgical intervention needed per Kayla Wyatt with recommendations to continue monitoring H/H as well as for recurrence of hematochezia. She tolerated extubation on 2/6 and respiratory status improving.   On 2/11, she has episode of unresponsiveness with jerking of bilateral limbs that lasted about 3 minutes. She had amnesia of events but was back to baseline.  EEG revealed "focal slowing over the right temporo-occipital region and occasional epileptiform discharges over the right occipital region". Head CT reviewed, unremarkable for acute intracranial process. MRI brain done revealing mild biparietal signal abnormality suspicious for PRES.  Kayla Wyatt recommended starting patient on Keppra BID with repeat MRI in 2 weeks. New onset seizure likely provoked by PRES--if resolved--Ok to take patient off Keppra.  Patient with resultant generalized weakness. CIR recommended due to functional deficits. "  Later as outpatient she had a PET scan on 05/14/17 which showed a hypermetabolic mediastinal mass which was subsequently biopsied with a bronchoscopy on 05/19/17. She notes that she had smoked for about 35-40 years, half a pack of a day. She reports that she has not had any other symptoms related to her smoking; she has never had to be on home oxygen or inhalers. She has recently begun Symbicort. While being in the hospital she lost about 20 lbs but has maintained her weight since being discharged.  The pt and her husband note that she is concerned about what to do if she is not able to go back to work  after her 90-day leave. We recommended that they speak with our social workers here to explore the available options for insurance coverage /disability application etc.  Of note prior to the patient's visit today, pt has had PET/CT completed on 05/14/17 with  results revealing 1. The known mediastinal mass is intensely hypermetabolic, worrisome for malignancy. This could reflect lymphoma or small cell lung cancer. Tissue sampling recommended.2. No peripheral lung mass identified. New patchy ground-glass opacity in the lingula is mildly hypermetabolic and likely inflammatory.  On 05/19/17 the pt had a Fine Needle Aspiration which revealed Non-Small cell lung carcinoma.  Most recent lab results (05/19/17) of CBC  is as follows: all values are WNL except for RBC at 2.90, Hgb at 8.6, HCT at 28.0, RDW at 16.4.  On review of systems, pt reports knee pain, and denies abdominal pains, leg swelling, SOB, CP, and any other symptoms.   On PMHx the pt reports acute blood loss anemia, GI hemorrhage with melena, hypercholesteremia, HTN, persistent atrial fibrillation with rapid ventricular response, Vitamin D deficiency, denies liver and kidney problems. On Social Hx the pt reports having smoked for 35-40 years of half a pack per day. She quit smoking on 03/31/17.  On Family Hx the pt reports that her father had heart disease.  Interval History:   Kayla Wyatt returns today regarding her Non-Small Cell Carcinoma. The patient's last visit with [place] was on 07/23/2020. The pt reports that she is doing well overall.  Patient notes no acute new symptoms since her last clinic visit. Patient's CT chest without contrast on 01/01/2021 showed Post treatment scarring in the left hemithorax. No evidence of recurrent or metastatic disease.  Lab results today stable and unremarkable.  Patient notes no new shortness of breath no chest pain no other acute new focal symptoms.  No new headaches.  No focal neurological deficits.  MEDICAL HISTORY:  Past Medical History:  Diagnosis Date   Acute blood loss anemia 04/02/2017   Anxiety    Gastrointestinal hemorrhage with melena    Headache    Hypercholesteremia    Hypertension    lung ca dx'd 05/2017   Persistent atrial  fibrillation with rapid ventricular response (HCC) 04/07/2017   Seizures (HCC)    04/2017 while in hospital at [facility]   Vitamin D deficiency     SURGICAL HISTORY: Past Surgical History:  Procedure Laterality Date   COLONOSCOPY Left 04/09/2017   Procedure: COLONOSCOPY;  Surgeon: Jeani Hawking, MD;  Location: [facility] ENDOSCOPY;  Service: Endoscopy;  Laterality: Left;   ESOPHAGOGASTRODUODENOSCOPY N/A 04/03/2017   Procedure: ESOPHAGOGASTRODUODENOSCOPY (EGD);  Surgeon: Jeani Hawking, MD;  Location: [facility] ENDOSCOPY;  Service: Endoscopy;  Laterality: N/A;   ESOPHAGOGASTRODUODENOSCOPY N/A 08/10/2019   Procedure: ESOPHAGOGASTRODUODENOSCOPY (EGD);  Surgeon: Charna Elizabeth, MD;  Location: [facility] ENDOSCOPY;  Service: Endoscopy;  Laterality: N/A;   ESOPHAGOGASTRODUODENOSCOPY (EGD) WITH PROPOFOL N/A 04/12/2017   Procedure: ESOPHAGOGASTRODUODENOSCOPY (EGD) WITH PROPOFOL;  Surgeon: Rachael Fee, MD;  Location[facility] ENDOSCOPY;  Service: Endoscopy;  Laterality: N/A;   FOREIGN BODY REMOVAL N/A 08/10/2019   Procedure: FOREIGN BODY REMOVAL;  Surgeon: Charna Elizabeth, MD;  Location: [facility] ENDOSCOPY;  Service: Endoscopy;  Laterality: N/A;   IR ANGIOGRAM SELECTIVE EACH ADDITIONAL VESSEL  04/12/2017   IR ANGIOGRAM SELECTIVE EACH ADDITIONAL VESSEL  04/13/2017   IR ANGIOGRAM SELECTIVE EACH ADDITIONAL VESSEL  04/13/2017   IR ANGIOGRAM SELECTIVE EACH ADDITIONAL VESSEL  04/13/2017   IR ANGIOGRAM VISCERAL SELECTIVE  04/12/2017   IR ANGIOGRAM VISCERAL SELECTIVE  04/13/2017  IR EMBO ART  VEN HEMORR LYMPH EXTRAV  INC GUIDE ROADMAPPING  04/12/2017   IR EMBO ART  VEN HEMORR LYMPH EXTRAV  INC GUIDE ROADMAPPING  04/13/2017   IR FLUORO GUIDE CV LINE RIGHT  04/13/2017   IR FLUORO GUIDE PORT INSERTION RIGHT  07/27/2017   IR IVC FILTER PLMT / S&I /IMG GUID/MOD SED  04/12/2017   IR US GUIDE VASC ACCESS RIGHT  04/12/2017   IR US GUIDE VASC ACCESS RIGHT  04/13/2017   IR US GUIDE VASC ACCESS RIGHT  04/13/2017   IR US GUIDE VASC ACCESS RIGHT  07/27/2017   LEG SURGERY  1974   Blood  Clot Removal    VIDEO BRONCHOSCOPY WITH ENDOBRONCHIAL ULTRASOUND N/A 05/19/2017   Procedure: VIDEO BRONCHOSCOPY WITH ENDOBRONCHIAL ULTRASOUND;  Surgeon: Grace Isaac, MD;  Location: [facility] OR;  Service: Thoracic;  Laterality: N/A;    SOCIAL HISTORY: Social History   Socioeconomic History   Marital status: Single    Spouse name: Not on file   Number of children: Not on file   Years of education: Not on file   Highest education level: Not on file  Occupational History   Occupation: Has to lift heavy boxes at times.    Employer: [employer]  Tobacco Use   Smoking status: Former    Packs/day: 0.50    Years: 40.00    Pack years: 20.00    Types: Cigarettes    Quit date: 03/31/2017    Years since quitting: 3.7   Smokeless tobacco: Never  Vaping Use   Vaping Use: Never used  Substance and Sexual Activity   Alcohol use: No   Drug use: No   Sexual activity: Not Currently  Other Topics Concern   Not on file  Social History Narrative   Not on file   Social Determinants of Health   Financial Resource Strain: Not on file  Food Insecurity: No Food Insecurity   Worried About Running Out of Food in the Last Year: Never true   Ran Out of Food in the Last Year: Never true  Transportation Needs: No Transportation Needs   Lack of Transportation (Medical): No   Lack of Transportation (Non-Medical): No  Physical Activity: Not on file  Stress: Not on file  Social Connections: Not on file  Intimate Partner Violence: Not on file    FAMILY HISTORY: Family History  Problem Relation Age of Onset   Heart disease Father     ALLERGIES:  has No Known Allergies.  MEDICATIONS:  Current Outpatient Medications  Medication Sig Dispense Refill   acetaminophen (TYLENOL) 325 MG tablet Take 1-2 tablets (325-650 mg total) by mouth every 4 (four) hours as needed for mild pain.     cholecalciferol (VITAMIN D) 1000 units tablet Take 1,000 Units by mouth 2 (two) times daily.     levothyroxine  (SYNTHROID) 75 MCG tablet Take 75 mcg by mouth every morning.     lidocaine-prilocaine (EMLA) cream Apply to affected area once 30 g 3   lisinopril-hydrochlorothiazide (PRINZIDE,ZESTORETIC) 20-12.5 MG tablet Take 1 tablet by mouth daily.     pantoprazole (PROTONIX) 40 MG tablet Take 1 tablet (40 mg total) by mouth daily. 30 tablet 3   simvastatin (ZOCOR) 20 MG tablet Take 20 mg by mouth.     triamcinolone cream (KENALOG) 0.1 % Apply 1 application topically 2 (two) times daily.      No current facility-administered medications for this visit.   Facility-Administered Medications Ordered in Other Visits  Medication  Dose Route Frequency Provider Last Rate Last Admin   sodium chloride flush (NS) 0.9 % injection 10 mL  10 mL Intracatheter PRN Brunetta Genera, MD   10 mL at 08/10/18 1558    REVIEW OF SYSTEMS:   .10 Point review of Systems was done is negative except as noted above.   PHYSICAL EXAMINATION: ECOG FS:1 - Symptomatic but completely ambulatory  Vitals:   01/07/21 0957  BP: (!) 124/46  Pulse: 92  Resp: 18  Temp: (!) 97.3 F (36.3 C)  SpO2: 98%   Wt Readings from Last 3 Encounters:  01/07/21 174 lb 6.4 oz (79.1 kg)  12/28/20 173 lb 6.4 oz (78.7 kg)  09/18/20 172 lb 4.8 oz (78.2 kg)   Body mass index is 30.89 kg/m.   [name] GENERAL:alert, in no acute distress and comfortable SKIN: no acute rashes, no significant lesions EYES: conjunctiva are pink and non-injected, sclera anicteric OROPHARYNX: MMM, no exudates, no oropharyngeal erythema or ulceration NECK: supple, no JVD LYMPH:  no palpable lymphadenopathy in the cervical, axillary or inguinal regions LUNGS: clear to auscultation b/l with normal respiratory effort HEART: regular rate & rhythm ABDOMEN:  normoactive bowel sounds , non tender, not distended. Extremity: no pedal edema PSYCH: alert & oriented x 3 with fluent speech NEURO: no focal motor/sensory deficits   LABORATORY DATA:  I have reviewed the data as  listed  . CBC Latest Ref Rng & Units 01/07/2021 07/23/2020 04/26/2020  WBC 4.0 - 10.5 K/uL 5.7 5.9 6.6  Hemoglobin 12.0 - 15.0 g/dL 11.3(L) 11.0(L) 10.7(L)  Hematocrit 36.0 - 46.0 % 33.6(L) 34.1(L) 33.0(L)  Platelets 150 - 400 K/uL 233 245 260    CMP Latest Ref Rng & Units 01/07/2021 07/23/2020 04/26/2020  Glucose 70 - 99 mg/dL 118(H) 116(H) 112(H)  BUN 8 - 23 mg/dL 28(H) 27(H) 30(H)  Creatinine 0.44 - 1.00 mg/dL 0.92 0.94 1.03(H)  Sodium 135 - 145 mmol/L 141 139 137  Potassium 3.5 - 5.1 mmol/L 4.4 3.9 3.9  Chloride 98 - 111 mmol/L 111 107 106  CO2 22 - 32 mmol/L $RemoveB'22 24 24  'MAvfGOjL$ Calcium 8.9 - 10.3 mg/dL 9.2 9.6 8.9  Total Protein 6.5 - 8.1 g/dL 7.0 7.1 7.1  Total Bilirubin 0.3 - 1.2 mg/dL 0.3 0.3 0.3  Alkaline Phos 38 - 126 U/L 63 70 76  AST 15 - 41 U/L $Remo'17 18 17  'jhrUU$ ALT 0 - 44 U/L $Remo'11 10 9  'KzizX$ 01/26/2019 CT Abdomen Pelvis W Contrast (Accession [id number])    01/26/2019 CT Chest W Contrast (Accession [id number])   05/19/17 Fine Needle Aspiration:  05/19/17 Bronchial Washing Specimen B:   RADIOGRAPHIC STUDIES: I have personally reviewed the radiological images as listed and agreed with the findings in the report. CT Chest Wo Contrast  Result Date: 01/02/2021 CLINICAL DATA:  Lung cancer. EXAM: CT CHEST WITHOUT CONTRAST TECHNIQUE: Multidetector CT imaging of the chest was performed following the standard protocol without IV contrast. COMPARISON:  07/16/2020. FINDINGS: Cardiovascular: Right IJ Port-A-Cath terminates in the right atrium. Atherosclerotic calcification of the aorta, aortic valve and coronary arteries. Heart is at the upper limits of normal in size to mildly enlarged. No pericardial effusion. Mediastinum/Nodes: No pathologically enlarged mediastinal or axillary lymph nodes. Hilar regions are difficult to definitively evaluate without IV contrast. Esophagus is grossly unremarkable. Lungs/Pleura: Centrilobular and paraseptal emphysema. Post treatment parenchymal consolidation and  bronchiectasis in the medial left upper and left lower lobes. Minimal scarring in the lingula. 5 mm nodular density along the medial left hemidiaphragm (  5/107), unchanged. No pleural fluid. Airway is unremarkable. Upper Abdomen: Visualized portions of the liver and gallbladder are unremarkable. Slight thickening of the adrenal glands. Visualized portions of the kidneys, spleen, pancreas, stomach and bowel are unremarkable with exception of a tiny hiatal hernia. Surgical clips in the gastrohepatic ligament and posterior to the stomach. No upper abdominal adenopathy. Musculoskeletal: Degenerative changes in the spine. No worrisome lytic or sclerotic lesions. IMPRESSION: 1. Post treatment scarring in the left hemithorax. No evidence of recurrent or metastatic disease. 2. Aortic atherosclerosis (ICD10-I70.0). Coronary artery calcification. 3.  Emphysema (ICD10-J43.9). Electronically Signed   By: Lorin Picket M.D.   On: 01/02/2021 13:55    MRI brain 04/21/2017: IMPRESSION: 1. Motion degraded examination. 2. 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 3. Otherwise negative MRI of the head with and without contrast for age.   Electronically Signed   By: Elon Alas M.D.   On: 04/22/2017 00:34    ASSESSMENT & PLAN:   65 y.o. female with  1.  Non-Small Cell Carcinoma atleast Stage IIIB  Not enough tissue for further characterization or mutation testing. Presenting with mediastinal nodal mass - TxN2 Mx (Atleast Stage III disease) ? Lingular primary vs inflammation. No overt evidence of metastatic disease.  -EGFR mutation studies on blood negative. Patient is s/p 3 cycles of Carboplatin/taxol -resolution left upper lobe ground-glass opacity has resolved in the interval. 08/19/17 PET revealed  No significant change in size of hypermetabolic AP window mass. The degree of FDG uptake is slightly increased in the interval. 2. Persistent  hypermetabolic focus involving the distal aspect of the tongue within SUV max of 16.46. Similar to previous exam. (no clinical co-relate). Lung involvement has decreased.   08/19/17 Brain MRI revealed No evidence of intracranial metastases. Interval resolution of bilateral parietal signal abnormality favoring PRES  Completed concurrent Carbo/taxol chemotherapy + RT  12/24/17 PET/CT revealed Marked interval response to therapy with large AP window lymph nodes seen on the previous study almost imperceptible on today's exam. Hypermetabolism within this lesion has resolved with FDG accumulation now at background blood pool levels. 2. Small focus of hypermetabolism in the anal region, similar to prior and indeterminate by PET imaging. 3. Hypermetabolic focus at the distal tongue has decreased in the Interval. 4. Marked pelvic floor laxity with cystocele. 5. Stable tiny left lung nodules. 6.  Aortic Atherosclerois. 7.  Emphysema.   04/03/17 endoscopy did not reveal esophageal thickening    04/27/18 CT Chest revealed Stable exam. No new or progressive findings. 2. Stable mild circumferential wall thickening mid esophagus. Esophagitis is a consideration. 3. Stable tiny left pulmonary nodules. 4.  Emphysema. 5.  Aortic Atherosclerois.  01/26/2019 CT Abdomen Pelvis W Contrast (Accession [id number]) revealed "1. Patchy areas of consolidation, bronchiectasis and ground-glass in the medial left hemithorax, presumably treatment related. No evidence of metastatic disease.2. Extensive debris in the mainstem bronchi, bronchus intermedius and left upper lobe bronchus."    07/27/2019 CT Chest ([id number]) revealed "Nodularity along the pleural surface in the posterior left chest."  2. Bone Pain from neulasta - managed with Tylenol  3. Previous h/ot SZ -currently on Keppra. MRI brain - resolution of findings of PRES  4. H/o rt calf DVT - off anticoagulation due to massive GI bleeding. S/p IVC filter. No issues  currently  5. S/p previous GI bleeding from Gastric ulcer --needing clipping and arterial embolization. On PPI BID Ulcers seen to be healed with recent April 2020 repeat endoscopy  6. H/o ctx /  steroid related thrush --now resolved.  8. Elevated TSH/Hypothyroidism -- ? Related to immunotherapy 07/13/18 Thyroid functions revealed TSH at 62.91 and Free T4 at 0.30 Began low dose 40mcg Levothyroxine and provide endocrinology referral for further optimization. Recommend taking Levothyroxine 1.5 hours before to breakfast with only water and no other medications.  F/u with Kayla. Kelton Pillar in Endocrinology   PLAN: -Discussed pt labwork today, 010/31/2022; Hgb improved, blood counts stable, chemistries stable.  -Discussed pt CT Chest wo contrast 01/01/2021; only radiation changes. No new findings or progression of lung cancer. -Advised pt that things are very stable at this time based on labs and imaging. Will switch visits to every 6 months with repeat scan. -Discussed possibility of removing Port. The pt is now agreeable to this. -No lab, clinical, evidence of Non-Small Cell Carcinoma recurrence at this time. -Will see back in 6 months with labs.    FOLLOW UP: Return to clinic with Kayla. Irene Limbo with lab in 6 months IR for Port-A-Cath removal in 1 to 2 weeks  . The total time spent in the appointment was 20 minutes and more than 50% was on counseling and direct patient cares.   All of the patient's questions were answered with apparent satisfaction. The patient knows to call the clinic with any problems, questions or concerns.   Sullivan Lone MD [place] AAHIVMS [facility] Hematology/Oncology Physician [facility]

## 2021-01-14 NOTE — Addendum Note (Signed)
Addended by: Sullivan Lone on: 01/14/2021 12:39 PM   Modules accepted: Orders

## 2021-02-07 ENCOUNTER — Ambulatory Visit (HOSPITAL_COMMUNITY)
Admission: RE | Admit: 2021-02-07 | Discharge: 2021-02-07 | Disposition: A | Discharge status: Home / Self Care | Payer: PPO | Source: Ambulatory Visit | Attending: Hematology | Admitting: Hematology

## 2021-02-07 ENCOUNTER — Other Ambulatory Visit: Payer: Self-pay

## 2021-02-07 DIAGNOSIS — Z452 Encounter for adjustment and management of vascular access device: Secondary | ICD-10-CM | POA: Insufficient documentation

## 2021-02-07 DIAGNOSIS — C3490 Malignant neoplasm of unspecified part of unspecified bronchus or lung: Secondary | ICD-10-CM | POA: Insufficient documentation

## 2021-02-07 HISTORY — PX: IR REMOVAL TUN ACCESS W/ PORT W/O FL MOD SED: IMG2290

## 2021-02-07 MED ORDER — LIDOCAINE-EPINEPHRINE (PF) 2 %-1:200000 IJ SOLN
INTRAMUSCULAR | Status: AC
  Filled 2021-02-07: qty 20

## 2021-02-07 MED ORDER — LIDOCAINE HCL 1 % IJ SOLN
INTRAMUSCULAR | Status: AC
  Filled 2021-02-07: qty 20

## 2021-02-07 NOTE — Procedures (Signed)
Interventional Radiology Procedure:   Indications: History of lung cancer and port is no longer needed.  Procedure: Port removal  Findings: Complete removal of right chest port  Complications: No immediate complications noted.     EBL: Minimal  Plan: Discharge to home.  Keep incision dry for 24 hours.    Payton Prinsen R. Anselm Pancoast, MD  Pager: [phone]

## 2021-04-11 ENCOUNTER — Other Ambulatory Visit: Payer: Self-pay | Admitting: Obstetrics & Gynecology

## 2021-04-11 ENCOUNTER — Encounter: Payer: Self-pay | Admitting: Hematology

## 2021-04-11 ENCOUNTER — Other Ambulatory Visit: Payer: Self-pay

## 2021-04-11 ENCOUNTER — Encounter: Payer: Self-pay | Admitting: Obstetrics & Gynecology

## 2021-04-11 ENCOUNTER — Ambulatory Visit (INDEPENDENT_AMBULATORY_CARE_PROVIDER_SITE_OTHER): Payer: PPO | Admitting: Obstetrics & Gynecology

## 2021-04-11 VITALS — BP 137/54 | HR 83 | Wt 179.2 lb

## 2021-04-11 DIAGNOSIS — Z1231 Encounter for screening mammogram for malignant neoplasm of breast: Secondary | ICD-10-CM

## 2021-04-11 DIAGNOSIS — N813 Complete uterovaginal prolapse: Secondary | ICD-10-CM | POA: Diagnosis not present

## 2021-04-11 DIAGNOSIS — Z4689 Encounter for fitting and adjustment of other specified devices: Secondary | ICD-10-CM | POA: Diagnosis not present

## 2021-04-11 NOTE — Progress Notes (Signed)
°  °  GYNECOLOGY OFFICE VISIT NOTE   Kayla Wyatt is a 66 y.o. PMP female with uterine procidentia, here for pessary maintenance.  #4 Ring with support pessary in place. No issues with the pessary, satisfied with this method.    Blood pressure (!) 137/54, pulse 83, weight 179 lb 3.2 oz (81.3 kg).  On pelvic exam, atrophic introitus noted but no prolapse.  Pessary in place. After adequate lubrication of the introitus, the pessary was removed.  It was cleaned with soap and water.  Vaginal speculum exam done, no lacerations or other concerning lesions noted. Pessary was replaced with adequate lubrication. Patient tolerated the procedure well.    She will return in three months for pessary maintenance.  Bleeding precautions reviewed.  Mammogram ordered and will be scheduled for patient, she had an abnormal one in 2021.       Verita Schneiders, MD, [place] for [employer], [place]

## 2021-04-11 NOTE — Progress Notes (Addendum)
Patient mammogram scheduled for 04/30/21 at 2:50 PM at the breast center. Patient notified.

## 2021-04-17 DIAGNOSIS — I10 Essential (primary) hypertension: Secondary | ICD-10-CM | POA: Diagnosis not present

## 2021-04-17 DIAGNOSIS — E039 Hypothyroidism, unspecified: Secondary | ICD-10-CM | POA: Diagnosis not present

## 2021-04-17 DIAGNOSIS — E7800 Pure hypercholesterolemia, unspecified: Secondary | ICD-10-CM | POA: Diagnosis not present

## 2021-04-30 ENCOUNTER — Other Ambulatory Visit: Payer: PPO

## 2021-07-02 ENCOUNTER — Other Ambulatory Visit: Payer: Self-pay

## 2021-07-02 ENCOUNTER — Inpatient Hospital Stay: Payer: PPO

## 2021-07-02 ENCOUNTER — Inpatient Hospital Stay: Payer: PPO | Attending: Hematology | Admitting: Hematology

## 2021-07-02 VITALS — BP 127/48 | HR 66 | Temp 97.4°F | Resp 20 | Wt 178.9 lb

## 2021-07-02 DIAGNOSIS — C3490 Malignant neoplasm of unspecified part of unspecified bronchus or lung: Secondary | ICD-10-CM

## 2021-07-02 DIAGNOSIS — Z87891 Personal history of nicotine dependence: Secondary | ICD-10-CM | POA: Insufficient documentation

## 2021-07-02 LAB — CMP (CANCER CENTER ONLY)
ALT: 11 U/L (ref 0–44)
AST: 17 U/L (ref 15–41)
Albumin: 4.1 g/dL (ref 3.5–5.0)
Alkaline Phosphatase: 66 U/L (ref 38–126)
Anion gap: 8 (ref 5–15)
BUN: 25 mg/dL — ABNORMAL HIGH (ref 8–23)
CO2: 28 mmol/L (ref 22–32)
Calcium: 9.5 mg/dL (ref 8.9–10.3)
Chloride: 105 mmol/L (ref 98–111)
Creatinine: 0.99 mg/dL (ref 0.44–1.00)
GFR, Estimated: >60 mL/min (ref >60)
Glucose, Bld: 112 mg/dL — ABNORMAL HIGH (ref 70–99)
Potassium: 4.6 mmol/L (ref 3.5–5.1)
Sodium: 141 mmol/L (ref 135–145)
Total Bilirubin: 0.4 mg/dL (ref 0.3–1.2)
Total Protein: 6.9 g/dL (ref 6.5–8.1)

## 2021-07-02 LAB — CBC WITH DIFFERENTIAL/PLATELET
Abs Immature Granulocytes: 0.02 10*3/uL (ref 0.00–0.07)
Basophils Absolute: 0 10*3/uL (ref 0.0–0.1)
Basophils Relative: 1 %
Eosinophils Absolute: 0.2 10*3/uL (ref 0.0–0.5)
Eosinophils Relative: 3 %
HCT: 35.6 % — ABNORMAL LOW (ref 36.0–46.0)
Hemoglobin: 11.5 g/dL — ABNORMAL LOW (ref 12.0–15.0)
Immature Granulocytes: 0 %
Lymphocytes Relative: 25 %
Lymphs Abs: 1.7 10*3/uL (ref 0.7–4.0)
MCH: 31.1 pg (ref 26.0–34.0)
MCHC: 32.3 g/dL (ref 30.0–36.0)
MCV: 96.2 fL (ref 80.0–100.0)
Monocytes Absolute: 0.6 10*3/uL (ref 0.1–1.0)
Monocytes Relative: 8 %
Neutro Abs: 4.3 10*3/uL (ref 1.7–7.7)
Neutrophils Relative %: 63 %
Platelets: 229 10*3/uL (ref 150–400)
RBC: 3.7 MIL/uL — ABNORMAL LOW (ref 3.87–5.11)
RDW: 14.2 % (ref 11.5–15.5)
WBC: 6.7 10*3/uL (ref 4.0–10.5)
nRBC: 0 % (ref 0.0–0.2)

## 2021-07-02 NOTE — Progress Notes (Signed)
. ? ? ? ?HEMATOLOGY/ONCOLOGY CLINIC NOTE ? ?Date of Service: .07/02/2021 ? ? ?Patient Care Team: ?[name], MD as PCP - General (Family Medicine) ? ?CHIEF COMPLAINTS/PURPOSE OF CONSULTATION:  ? ?F/u for continued management of Non-Small Cell lung Carcinoma ? ?HISTORY OF PRESENTING ILLNESS:  ?Please see previous note for details on initial presentation ? ?Interval History:  ?[name] is a 66 y.o. female here for continued management her Non-Small Cell Carcinoma. The patient's last visit with [place] was on 01/07/2021. She reports She is doing well with no new symptoms or concerns. ? ?No headaches or changes in vision ?No fever, chills, night sweats. ?No new lumps, bumps, or lesions/rashes. ?No abdominal pain or change in bowel habits. ?No new or unexpected weight loss. ?No SOB or chest pain. ?No other new or acute focal symptoms. ? ?No evidence of recurrent or metastatic disease. ? ?Lab results today stable and unremarkable. ?CBC shows mild anemia with RBC count of 3.70, Hgb of 11.5 g/dL, and HCT of 35.6%. ? ?MEDICAL HISTORY:  ?Past Medical History:  ?Diagnosis Date  ? Acute blood loss anemia 04/02/2017  ? Anxiety   ? Gastrointestinal hemorrhage with melena   ? Headache   ? Hypercholesteremia   ? Hypertension   ? lung ca dx'd 05/2017  ? Persistent atrial fibrillation with rapid ventricular response ([place]) 04/07/2017  ? Seizures ([place])   ? 04/2017 while in hospital at [facility]  ? Vitamin D deficiency   ? ? ?SURGICAL HISTORY: ?Past Surgical History:  ?Procedure Laterality Date  ? COLONOSCOPY Left 04/09/2017  ? Procedure: COLONOSCOPY;  Surgeon: Carol Ada, MD;  Location: [place];  Service: Endoscopy;  Laterality: Left;  ? ESOPHAGOGASTRODUODENOSCOPY N/A 04/03/2017  ? Procedure: ESOPHAGOGASTRODUODENOSCOPY (EGD);  Surgeon: Carol Ada, MD;  Location: [place];  Service: Endoscopy;  Laterality: N/A;  ? ESOPHAGOGASTRODUODENOSCOPY N/A 08/10/2019  ? Procedure: ESOPHAGOGASTRODUODENOSCOPY (EGD);  Surgeon: Juanita Craver,  MD;  Location: [facility];  Service: Endoscopy;  Laterality: N/A;  ? ESOPHAGOGASTRODUODENOSCOPY (EGD) WITH PROPOFOL N/A 04/12/2017  ? Procedure: ESOPHAGOGASTRODUODENOSCOPY (EGD) WITH PROPOFOL;  Surgeon: Milus Banister, MD;  Location: [facility] ENDOSCOPY;  Service: Endoscopy;  Laterality: N/A;  ? FOREIGN BODY REMOVAL N/A 08/10/2019  ? Procedure: FOREIGN BODY REMOVAL;  Surgeon: Juanita Craver, MD;  Location: [facility] ENDOSCOPY;  Service: Endoscopy;  Laterality: N/A;  ? IR ANGIOGRAM SELECTIVE EACH ADDITIONAL VESSEL  04/12/2017  ? IR ANGIOGRAM SELECTIVE EACH ADDITIONAL VESSEL  04/13/2017  ? IR ANGIOGRAM SELECTIVE EACH ADDITIONAL VESSEL  04/13/2017  ? IR ANGIOGRAM SELECTIVE EACH ADDITIONAL VESSEL  04/13/2017  ? IR ANGIOGRAM VISCERAL SELECTIVE  04/12/2017  ? IR ANGIOGRAM VISCERAL SELECTIVE  04/13/2017  ? IR EMBO ART  VEN HEMORR LYMPH EXTRAV  INC GUIDE ROADMAPPING  04/12/2017  ? IR EMBO ART  VEN HEMORR LYMPH EXTRAV  INC GUIDE ROADMAPPING  04/13/2017  ? IR FLUORO GUIDE CV LINE RIGHT  04/13/2017  ? IR FLUORO GUIDE PORT INSERTION RIGHT  07/27/2017  ? IR IVC FILTER PLMT / S&I /IMG GUID/MOD SED  04/12/2017  ? IR REMOVAL TUN ACCESS W/ PORT W/O FL MOD SED  02/07/2021  ? IR US GUIDE VASC ACCESS RIGHT  04/12/2017  ? IR US GUIDE VASC ACCESS RIGHT  04/13/2017  ? IR US GUIDE VASC ACCESS RIGHT  04/13/2017  ? IR US GUIDE VASC ACCESS RIGHT  07/27/2017  ? LEG SURGERY  1974  ? Blood Clot Removal   ? VIDEO BRONCHOSCOPY WITH ENDOBRONCHIAL ULTRASOUND N/A 05/19/2017  ? Procedure: VIDEO BRONCHOSCOPY WITH ENDOBRONCHIAL ULTRASOUND;  Surgeon: Grace Isaac, MD;  Location: [place];  Service: Thoracic;  Laterality: N/A;  ? ? ?SOCIAL HISTORY: ?Social History  ? ?Socioeconomic History  ? Marital status: Single  ?  Spouse name: Not on file  ? Number of children: Not on file  ? Years of education: Not on file  ? Highest education level: Not on file  ?Occupational History  ? Occupation: Has to lift heavy boxes at times.  ?  Employer: [employer]  ?Tobacco Use  ? Smoking status: Former  ?   Packs/day: 0.50  ?  Years: 40.00  ?  Pack years: 20.00  ?  Types: Cigarettes  ?  Quit date: 03/31/2017  ?  Years since quitting: 4.2  ? Smokeless tobacco: Never  ?Vaping Use  ? Vaping Use: Never used  ?Substance and Sexual Activity  ? Alcohol use: No  ? Drug use: No  ? Sexual activity: Not Currently  ?Other Topics Concern  ? Not on file  ?Social History Narrative  ? Not on file  ? ?Social Determinants of Health  ? ?Financial Resource Strain: Not on file  ?Food Insecurity: Not on file  ?Transportation Needs: Not on file  ?Physical Activity: Not on file  ?Stress: Not on file  ?Social Connections: Not on file  ?Intimate Partner Violence: Not on file  ? ? ?FAMILY HISTORY: ?Family History  ?Problem Relation Age of Onset  ? Heart disease Father   ? ? ?ALLERGIES:  has No Known Allergies. ? ?MEDICATIONS:  ?Current Outpatient Medications  ?Medication Sig Dispense Refill  ? acetaminophen (TYLENOL) 325 MG tablet Take 1-2 tablets (325-650 mg total) by mouth every 4 (four) hours as needed for mild pain.    ? cholecalciferol (VITAMIN D) 1000 units tablet Take 1,000 Units by mouth 2 (two) times daily.    ? levothyroxine (SYNTHROID) 75 MCG tablet Take 75 mcg by mouth every morning.    ? lidocaine-prilocaine (EMLA) cream Apply to affected area once (Patient not taking: Reported on 04/11/2021) 30 g 3  ? lisinopril-hydrochlorothiazide (PRINZIDE,ZESTORETIC) 20-12.5 MG tablet Take 1 tablet by mouth daily.    ? pantoprazole (PROTONIX) 40 MG tablet Take 1 tablet (40 mg total) by mouth daily. 30 tablet 3  ? simvastatin (ZOCOR) 20 MG tablet Take 20 mg by mouth.    ? triamcinolone cream (KENALOG) 0.1 % Apply 1 application topically 2 (two) times daily.  (Patient not taking: Reported on 04/11/2021)    ? ?No current facility-administered medications for this visit.  ? ?Facility-Administered Medications Ordered in Other Visits  ?Medication Dose Route Frequency Provider Last Rate Last Admin  ? sodium chloride flush (NS) 0.9 % injection 10 mL  10 mL  Intracatheter PRN Brunetta Genera, MD   10 mL at 08/10/18 1558  ? ? ?REVIEW OF SYSTEMS:   ?.10 Point review of Systems was done is negative except as noted above. ? ? ?PHYSICAL EXAMINATION: ?ECOG FS:1 - Symptomatic but completely ambulatory ? ?Vitals:  ? 07/02/21 1450  ?BP: (!) 127/48  ?Pulse: 66  ?Resp: 20  ?Temp: (!) 97.4 ?F (36.3 ?C)  ?SpO2: 99%  ? ?Wt Readings from Last 3 Encounters:  ?07/02/21 178 lb 14.4 oz (81.1 kg)  ?04/11/21 179 lb 3.2 oz (81.3 kg)  ?01/07/21 174 lb 6.4 oz (79.1 kg)  ? ?Body mass index is 31.69 kg/m?[name]   ?NAD ?GENERAL:alert, in no acute distress and comfortable ?SKIN: no acute rashes, no significant lesions ?EYES: conjunctiva are pink and non-injected, sclera anicteric ?NECK: supple, no  JVD ?LYMPH:  no palpable lymphadenopathy in the cervical, axillary or inguinal regions ?LUNGS: clear to auscultation b/l with normal respiratory effort ?HEART: regular rate & rhythm ?ABDOMEN:  normoactive bowel sounds , non tender, not distended. ?Extremity: no pedal edema ?PSYCH: alert & oriented x 3 with fluent speech ?NEURO: no focal motor/sensory deficits ? ?LABORATORY DATA:  ?I have reviewed the data as listed ? ?. ? ?  Latest Ref Rng & Units 07/02/2021  ?  2:08 PM 01/07/2021  ?  9:42 AM 07/23/2020  ? 10:25 AM  ?CBC  ?WBC 4.0 - 10.5 K/uL 6.7   5.7   5.9    ?Hemoglobin 12.0 - 15.0 g/dL 11.5   11.3   11.0    ?Hematocrit 36.0 - 46.0 % 35.6   33.6   34.1    ?Platelets 150 - 400 K/uL 229   233   245    ? ? ? ?  Latest Ref Rng & Units 07/02/2021  ?  2:08 PM 01/07/2021  ?  9:42 AM 07/23/2020  ? 10:25 AM  ?CMP  ?Glucose 70 - 99 mg/dL 112   118   116    ?BUN 8 - 23 mg/dL 25   28   27     ?Creatinine 0.44 - 1.00 mg/dL 0.99   0.92   0.94    ?Sodium 135 - 145 mmol/L 141   141   139    ?Potassium 3.5 - 5.1 mmol/L 4.6   4.4   3.9    ?Chloride 98 - 111 mmol/L 105   111   107    ?CO2 22 - 32 mmol/L 28   22   24     ?Calcium 8.9 - 10.3 mg/dL 9.5   9.2   9.6    ?Total Protein 6.5 - 8.1 g/dL 6.9   7.0   7.1    ?Total  Bilirubin 0.3 - 1.2 mg/dL 0.4   0.3   0.3    ?Alkaline Phos 38 - 126 U/L 66   63   70    ?AST 15 - 41 U/L 17   17   18     ?ALT 0 - 44 U/L 11   11   10     ?01/26/2019 CT Abdomen Pelvis W Contrast (Accession [id number]

## 2021-07-03 ENCOUNTER — Telehealth: Payer: Self-pay | Admitting: Hematology

## 2021-07-03 NOTE — Telephone Encounter (Signed)
Scheduled follow-up appointment per 4/25 los. Patient is aware. ?

## 2021-07-04 ENCOUNTER — Encounter: Payer: Self-pay | Admitting: Hematology

## 2021-07-08 DIAGNOSIS — E039 Hypothyroidism, unspecified: Secondary | ICD-10-CM | POA: Diagnosis not present

## 2021-07-08 DIAGNOSIS — I10 Essential (primary) hypertension: Secondary | ICD-10-CM | POA: Diagnosis not present

## 2021-07-08 DIAGNOSIS — E7800 Pure hypercholesterolemia, unspecified: Secondary | ICD-10-CM | POA: Diagnosis not present

## 2021-07-08 DIAGNOSIS — K219 Gastro-esophageal reflux disease without esophagitis: Secondary | ICD-10-CM | POA: Diagnosis not present

## 2021-07-08 DIAGNOSIS — C3490 Malignant neoplasm of unspecified part of unspecified bronchus or lung: Secondary | ICD-10-CM | POA: Diagnosis not present

## 2021-07-08 DIAGNOSIS — R7303 Prediabetes: Secondary | ICD-10-CM | POA: Diagnosis not present

## 2021-07-29 ENCOUNTER — Encounter: Payer: Self-pay | Admitting: Obstetrics & Gynecology

## 2021-07-29 ENCOUNTER — Ambulatory Visit: Payer: PPO | Admitting: Obstetrics & Gynecology

## 2021-07-29 VITALS — BP 122/54 | HR 92 | Wt 177.0 lb

## 2021-07-29 DIAGNOSIS — Z1231 Encounter for screening mammogram for malignant neoplasm of breast: Secondary | ICD-10-CM | POA: Diagnosis not present

## 2021-07-29 DIAGNOSIS — N813 Complete uterovaginal prolapse: Secondary | ICD-10-CM | POA: Diagnosis not present

## 2021-07-29 DIAGNOSIS — Z4689 Encounter for fitting and adjustment of other specified devices: Secondary | ICD-10-CM | POA: Diagnosis not present

## 2021-07-29 NOTE — Progress Notes (Signed)
Patient's mammogram has been scheduled for August 14, 2021 @8 :45am  [facility]  Information was given to patient and she verbalized understanding. No further questions or concerns.    Zella Richer, [address]   07/29/21

## 2021-07-29 NOTE — Progress Notes (Signed)
    GYNECOLOGY OFFICE VISIT NOTE   Kayla Wyatt is a 66 y.o. PMP female with uterine procidentia, here for pessary maintenance.   #4 Ring with support pessary in place. No issues with the pessary, satisfied with this method.    Blood pressure (!) 122/54, pulse 92, weight 177 lb (80.3 kg).  On pelvic exam, atrophic introitus noted but no prolapse.  Pessary in place. After adequate lubrication of the introitus, the pessary was removed.  It was cleaned with soap and water.  Vaginal speculum exam done, no lacerations or other concerning lesions noted. Pessary was replaced with adequate lubrication. Patient tolerated the procedure well.    She will return in three months for pessary maintenance.  Bleeding precautions reviewed.  Mammogram re-ordered and will be re-scheduled for patient, she had an abnormal one in 2021.      Verita Schneiders, MD, [place] for [employer], [place]

## 2021-08-14 ENCOUNTER — Other Ambulatory Visit: Payer: PPO

## 2021-08-14 ENCOUNTER — Other Ambulatory Visit: Payer: Self-pay | Admitting: Oncology

## 2021-11-18 ENCOUNTER — Encounter: Payer: Self-pay | Admitting: Hematology

## 2021-11-18 ENCOUNTER — Emergency Department (HOSPITAL_COMMUNITY)
Admission: EM | Admit: 2021-11-18 | Discharge: 2021-12-08 | Disposition: E | Payer: PPO | Attending: Emergency Medicine | Admitting: Emergency Medicine

## 2021-11-18 DIAGNOSIS — Z79899 Other long term (current) drug therapy: Secondary | ICD-10-CM | POA: Insufficient documentation

## 2021-11-18 DIAGNOSIS — R4020 Unspecified coma: Secondary | ICD-10-CM | POA: Diagnosis present

## 2021-11-18 DIAGNOSIS — Z85118 Personal history of other malignant neoplasm of bronchus and lung: Secondary | ICD-10-CM | POA: Diagnosis not present

## 2021-11-18 DIAGNOSIS — I499 Cardiac arrhythmia, unspecified: Secondary | ICD-10-CM | POA: Diagnosis not present

## 2021-11-18 DIAGNOSIS — R0689 Other abnormalities of breathing: Secondary | ICD-10-CM | POA: Diagnosis not present

## 2021-11-18 DIAGNOSIS — I10 Essential (primary) hypertension: Secondary | ICD-10-CM | POA: Insufficient documentation

## 2021-11-18 DIAGNOSIS — I469 Cardiac arrest, cause unspecified: Secondary | ICD-10-CM | POA: Diagnosis not present

## 2021-11-18 DIAGNOSIS — R55 Syncope and collapse: Secondary | ICD-10-CM | POA: Diagnosis not present

## 2021-11-18 DIAGNOSIS — R404 Transient alteration of awareness: Secondary | ICD-10-CM | POA: Diagnosis not present

## 2021-11-18 MED ORDER — MAGNESIUM SULFATE 50 % IJ SOLN
INTRAMUSCULAR | Status: AC | PRN
  Administered 2021-11-18: 2 g via INTRAVENOUS

## 2021-11-18 MED ORDER — CALCIUM CHLORIDE 10 % IV SOLN
INTRAVENOUS | Status: AC | PRN
Start: 2021-11-18 — End: 2021-11-18
  Administered 2021-11-18: 1 g via INTRAVENOUS

## 2021-11-18 MED ORDER — SODIUM BICARBONATE 8.4 % IV SOLN
INTRAVENOUS | Status: AC | PRN
  Administered 2021-11-18: 50 meq via INTRAVENOUS

## 2021-11-18 MED ORDER — EPINEPHRINE 1 MG/10ML IJ SOSY
PREFILLED_SYRINGE | INTRAMUSCULAR | Status: AC | PRN
Start: 2021-11-18 — End: 2021-11-18
  Administered 2021-11-18 (×2): 1 mg via INTRAVENOUS

## 2021-11-18 MED ORDER — LIDOCAINE HCL (CARDIAC) PF 100 MG/5ML IV SOSY
PREFILLED_SYRINGE | INTRAVENOUS | Status: AC | PRN
  Administered 2021-11-18: 100 mg via INTRAVENOUS

## 2021-12-08 NOTE — Code Documentation (Signed)
Patient time of death occurred at 23.

## 2021-12-08 NOTE — ED Provider Notes (Addendum)
[facility] EMERGENCY DEPARTMENT Provider Note   CSN: [id number] Arrival date & time: 11/21/2021  1017     History  Chief Complaint  Patient presents with   Cardiac Arrest    Kayla Wyatt is a 66 y.o. female.  With PMH of HTN, HLD, A-fib, seizures and lung cancer who was brought in by EMS from home after unresponsive episode.  She was found to be in V-fib arrest and CPR was started around 9:40 AM this morning.  She was given multiple epinephrine pushes, multiple defibrillations and a King airway was placed.  They also gave patient 300 mg of amiodarone.  She was initially V-fib and V. tach arrest and eventually PEA arrest.  By the time they arrived in the ER, they thought they may have had a ROSC although they noted very faint pulses and blood pressure.  By the time she got to the ER, no pulses were palpated and CPR was restarted.   Patient's family member said she was complaining of ongoing right-sided chest pain since they had a port removed a while ago.  Today she was feeling it again and clutching her chest and he went to get her washcloths but he found her shaking on the couch and unresponsive and called 911.  HPI     Home Medications Prior to Admission medications   Medication Sig Start Date End Date Taking? Authorizing Provider  acetaminophen (TYLENOL) 325 MG tablet Take 1-2 tablets (325-650 mg total) by mouth every 4 (four) hours as needed for mild pain. 04/30/17   Love, Ivan Anchors, PA-C  cholecalciferol (VITAMIN D) 1000 units tablet Take 1,000 Units by mouth 2 (two) times daily.    [provider]  levothyroxine (SYNTHROID) 75 MCG tablet Take 75 mcg by mouth every morning. 07/30/20   [provider]  lidocaine-prilocaine (EMLA) cream Apply to affected area once Patient not taking: Reported on 04/11/2021 01/18/18   Brunetta Genera, MD  lisinopril-hydrochlorothiazide (PRINZIDE,ZESTORETIC) 20-12.5 MG tablet Take 1 tablet by mouth daily.     [provider]  pantoprazole (PROTONIX) 40 MG tablet Take 1 tablet (40 mg total) by mouth daily. 08/11/19   Orpah Greek, MD  simvastatin (ZOCOR) 20 MG tablet Take 20 mg by mouth. 07/23/20   [provider]  triamcinolone cream (KENALOG) 0.1 % Apply 1 application topically 2 (two) times daily.  Patient not taking: Reported on 04/11/2021 04/15/19   [provider]      Allergies    Patient has no known allergies.    Review of Systems   Review of Systems  Physical Exam Updated Vital Signs BP (!) 105/56   Pulse (!) 0   Resp (!) 69   Ht 5\' 3"  (1.6 m)   Wt 77.1 kg   SpO2 93%   BMI 30.11 kg/m  Physical Exam Constitutional: Unresponsive critically ill in appearance Eyes: Pupils fixed and dilated Cardiovascular: Lukas in place compressing Respiratory: King airway in place, rhonchi bilaterally Gastrointestinal: Soft and nondistended Musculoskeletal: No gross edema or signs of DVT Neurologic: Unresponsive, no pupil dilation or response Skin: Skin is cool and dry Psychiatric: Unresponsive  ED Results / Procedures / Treatments   Labs (all labs ordered are listed, but only abnormal results are displayed) Labs Reviewed - No data to display  EKG None  Radiology No results found.  Procedures .Critical Care  Performed by: Elgie Congo, MD Authorized by: Elgie Congo, MD   Critical care provider statement:  Critical care time (minutes):  45   Critical care was necessary to treat or prevent imminent or life-threatening deterioration of the following conditions:  Cardiac failure   Critical care was time spent personally by me on the following activities:  Development of treatment plan with patient or surrogate, discussions with consultants, evaluation of patient's response to treatment, examination of patient, ordering and review of laboratory studies, ordering and review of radiographic studies, ordering and performing treatments and  interventions, pulse oximetry, re-evaluation of patient's condition, review of old charts and obtaining history from patient or surrogate    Medications Ordered in ED Medications  EPINEPHrine (ADRENALIN) 1 MG/10ML injection (1 mg Intravenous Given November 24, 2021 1023)  sodium bicarbonate injection (50 mEq Intravenous Given 11/24/2021 1021)  calcium chloride injection (1 g Intravenous Given 11/24/21 1022)  lidocaine (cardiac) 100 mg/70mL (XYLOCAINE) injection 2% (100 mg Intravenous Given 11-24-21 1028)  magnesium sulfate (IV Push/IM) injection (2 g Intravenous Given 2021/11/24 1029)    ED Course/ Medical Decision Making/ A&P                           Medical Decision Making  Kayla Wyatt is a 66 y.o. female.  With PMH of HTN, HLD, A-fib, seizures and lung cancer who was brought in by EMS from home after unresponsive episode.  She was found to be in V-fib arrest and CPR was started around 9:40 AM this morning.  She was given multiple epinephrine pushes, multiple defibrillations and a King airway was placed.  They also gave patient 300 mg of amiodarone.  She was initially V-fib and V. tach arrest and eventually PEA arrest.  By the time they arrived in the ER, they thought they may have had a ROSC although they noted very faint pulses and blood pressure.  By the time she got to the ER, no pulses were palpated and CPR was restarted.  Based on patient's initial V-fib arrest and story, suspect cardiac event such as STEMI or acute arrhythmia.  Unlikely PE with no initial PEA arrest, no signs of DVT on exam and no hypoxia with King airway in place.  With initial PEA arrest upon arrival, CPR was restarted, she was further given 2 more doses of epinephrine followed by 2 defibrillations for return of V-fib/V. tach arrest. Calcium and bicarb administered.  She was also given lidocaine push and magnesium for possible torsades rhythm.  She eventually decompensated back to PEA arrest.  No ROSC was obtained.  She was  satting low 90s to mid 90s with [facility] airway in place. Time of Death called at 10:36 AM.  Patient's brother was at bedside and updated.  Spoke with Dr. [name] her PCP regarding patient's death today.  He will fill out patient's death paperwork.  Risk Prescription drug management.    Final Clinical Impression(s) / ED Diagnoses Final diagnoses:  Cardiac arrest [facility])    Rx / DC Orders ED Discharge Orders     None         Elgie Congo, MD 11/24/21 1206    Elgie Congo, MD 11/24/2021 1207    Elgie Congo, MD 11-24-2021 1228

## 2021-12-08 NOTE — ED Notes (Signed)
Defibrillator pads placed on patient.

## 2021-12-08 NOTE — ED Triage Notes (Signed)
Pt arrived via [facility] from home. Pt was awake and alert this morning, informed her brother that she was having chest pain. Brother later found patient in chair unresponsive and called 911. Upon ems arrival to home, cpr was initiated. Patient was given multiple doses of epi and defibrillated 2-3 times in the field. Upon ems arrival to the hospital they state rosc was obtained. Upon assessment patient was in PEA and pulseless, CPR was resumed Dr. Nechama Guard, RT, Geannie Risen, CNA and Pharmacy at bedside.

## 2021-12-08 NOTE — Code Documentation (Signed)
Family updated as to patient's status.

## 2021-12-08 NOTE — Progress Notes (Signed)
   12-18-2021 1100  Clinical Encounter Type  Visited With Patient not available;Family  Visit Type Initial;Death;Spiritual support  Referral From Nurse  Consult/Referral To Chaplain   Chaplain responded to a call from the nurse requesting support for the patient's brother, Patrick Jupiter. The patient, Kayla Wyatt came in earlier today, however she died.  I was present as the doctor and nurse informed the patient's brother of her death.  Patrick Jupiter took the information calmly and asked for a some time with his sister.  On the way down he shared that he had moved into the place they reside now to care for both his father and mother and he and his sister took care of one another. Wayne spent sometime with his sister and was ready to leave.   I provided Patrick Jupiter with a Patient Placement card with instructions to call the number when he settled on a funeral home.   Danice Goltz [facility]  [phone]

## 2021-12-08 DEATH — deceased

## 2021-12-17 ENCOUNTER — Ambulatory Visit: Payer: PPO | Admitting: Hematology

## 2021-12-17 ENCOUNTER — Other Ambulatory Visit: Payer: PPO
